# Patient Record
Sex: Male | Born: 1940 | Race: White | Hispanic: No | Marital: Married | Smoking: Former smoker
Health system: Southern US, Community
[De-identification: ages and names within clinical notes are randomized; demographics above are authoritative.]

## PROBLEM LIST (undated history)

## (undated) ENCOUNTER — Emergency Department (HOSPITAL_COMMUNITY): Admission: EM | Payer: Medicare HMO | Source: Home / Self Care

## (undated) DIAGNOSIS — M7500 Adhesive capsulitis of unspecified shoulder: Secondary | ICD-10-CM

## (undated) DIAGNOSIS — M549 Dorsalgia, unspecified: Secondary | ICD-10-CM

## (undated) DIAGNOSIS — K5790 Diverticulosis of intestine, part unspecified, without perforation or abscess without bleeding: Secondary | ICD-10-CM

## (undated) DIAGNOSIS — K219 Gastro-esophageal reflux disease without esophagitis: Secondary | ICD-10-CM

## (undated) DIAGNOSIS — I639 Cerebral infarction, unspecified: Secondary | ICD-10-CM

## (undated) DIAGNOSIS — G709 Myoneural disorder, unspecified: Secondary | ICD-10-CM

## (undated) DIAGNOSIS — I509 Heart failure, unspecified: Secondary | ICD-10-CM

## (undated) DIAGNOSIS — I10 Essential (primary) hypertension: Secondary | ICD-10-CM

## (undated) DIAGNOSIS — J449 Chronic obstructive pulmonary disease, unspecified: Secondary | ICD-10-CM

## (undated) DIAGNOSIS — Z9981 Dependence on supplemental oxygen: Secondary | ICD-10-CM

## (undated) DIAGNOSIS — R21 Rash and other nonspecific skin eruption: Secondary | ICD-10-CM

## (undated) DIAGNOSIS — R0602 Shortness of breath: Secondary | ICD-10-CM

## (undated) DIAGNOSIS — N189 Chronic kidney disease, unspecified: Secondary | ICD-10-CM

## (undated) DIAGNOSIS — M869 Osteomyelitis, unspecified: Secondary | ICD-10-CM

## (undated) DIAGNOSIS — I209 Angina pectoris, unspecified: Secondary | ICD-10-CM

## (undated) DIAGNOSIS — F419 Anxiety disorder, unspecified: Secondary | ICD-10-CM

## (undated) DIAGNOSIS — I2699 Other pulmonary embolism without acute cor pulmonale: Secondary | ICD-10-CM

## (undated) DIAGNOSIS — I2510 Atherosclerotic heart disease of native coronary artery without angina pectoris: Secondary | ICD-10-CM

## (undated) DIAGNOSIS — R7881 Bacteremia: Secondary | ICD-10-CM

## (undated) DIAGNOSIS — M86171 Other acute osteomyelitis, right ankle and foot: Secondary | ICD-10-CM

## (undated) DIAGNOSIS — G4730 Sleep apnea, unspecified: Secondary | ICD-10-CM

## (undated) DIAGNOSIS — F32A Depression, unspecified: Secondary | ICD-10-CM

## (undated) DIAGNOSIS — R296 Repeated falls: Secondary | ICD-10-CM

## (undated) DIAGNOSIS — I739 Peripheral vascular disease, unspecified: Secondary | ICD-10-CM

## (undated) DIAGNOSIS — F329 Major depressive disorder, single episode, unspecified: Secondary | ICD-10-CM

## (undated) DIAGNOSIS — I219 Acute myocardial infarction, unspecified: Secondary | ICD-10-CM

## (undated) DIAGNOSIS — E785 Hyperlipidemia, unspecified: Secondary | ICD-10-CM

## (undated) DIAGNOSIS — N390 Urinary tract infection, site not specified: Secondary | ICD-10-CM

## (undated) DIAGNOSIS — G8929 Other chronic pain: Secondary | ICD-10-CM

## (undated) DIAGNOSIS — B9562 Methicillin resistant Staphylococcus aureus infection as the cause of diseases classified elsewhere: Secondary | ICD-10-CM

## (undated) HISTORY — DX: Major depressive disorder, single episode, unspecified: F32.9

## (undated) HISTORY — DX: Chronic obstructive pulmonary disease, unspecified: J44.9

## (undated) HISTORY — DX: Bacteremia: R78.81

## (undated) HISTORY — DX: Other acute osteomyelitis, right ankle and foot: M86.171

## (undated) HISTORY — DX: Peripheral vascular disease, unspecified: I73.9

## (undated) HISTORY — DX: Gastro-esophageal reflux disease without esophagitis: K21.9

## (undated) HISTORY — PX: OTHER SURGICAL HISTORY: SHX169

## (undated) HISTORY — DX: Urinary tract infection, site not specified: N39.0

## (undated) HISTORY — DX: Other pulmonary embolism without acute cor pulmonale: I26.99

## (undated) HISTORY — DX: Diverticulosis of intestine, part unspecified, without perforation or abscess without bleeding: K57.90

## (undated) HISTORY — DX: Osteomyelitis, unspecified: M86.9

## (undated) HISTORY — DX: Anxiety disorder, unspecified: F41.9

## (undated) HISTORY — PX: CORONARY STENT PLACEMENT: SHX1402

## (undated) HISTORY — DX: Cerebral infarction, unspecified: I63.9

## (undated) HISTORY — DX: Rash and other nonspecific skin eruption: R21

## (undated) HISTORY — DX: Dorsalgia, unspecified: M54.9

## (undated) HISTORY — DX: Other chronic pain: G89.29

## (undated) HISTORY — DX: Atherosclerotic heart disease of native coronary artery without angina pectoris: I25.10

## (undated) HISTORY — DX: Hyperlipidemia, unspecified: E78.5

## (undated) HISTORY — DX: Adhesive capsulitis of unspecified shoulder: M75.00

## (undated) HISTORY — PX: FRACTURE SURGERY: SHX138

## (undated) HISTORY — PX: HIP ARTHROPLASTY: SHX981

## (undated) HISTORY — DX: Methicillin resistant Staphylococcus aureus infection as the cause of diseases classified elsewhere: B95.62

## (undated) HISTORY — DX: Depression, unspecified: F32.A

---

## 1981-03-24 HISTORY — PX: OTHER SURGICAL HISTORY: SHX169

## 1990-03-24 HISTORY — PX: CORONARY ARTERY BYPASS GRAFT: SHX141

## 1997-07-04 ENCOUNTER — Encounter: Admission: RE | Admit: 1997-07-04 | Discharge: 1997-07-04 | Payer: Self-pay | Admitting: Family Medicine

## 1997-07-08 ENCOUNTER — Emergency Department (HOSPITAL_COMMUNITY): Admission: EM | Admit: 1997-07-08 | Discharge: 1997-07-08 | Payer: Self-pay | Admitting: Emergency Medicine

## 1997-07-17 ENCOUNTER — Encounter: Admission: RE | Admit: 1997-07-17 | Discharge: 1997-07-17 | Payer: Self-pay | Admitting: Family Medicine

## 1997-07-27 ENCOUNTER — Encounter: Admission: RE | Admit: 1997-07-27 | Discharge: 1997-07-27 | Payer: Self-pay | Admitting: Family Medicine

## 1997-08-10 ENCOUNTER — Encounter: Admission: RE | Admit: 1997-08-10 | Discharge: 1997-08-10 | Payer: Self-pay | Admitting: Family Medicine

## 1997-08-21 ENCOUNTER — Encounter: Admission: RE | Admit: 1997-08-21 | Discharge: 1997-08-21 | Payer: Self-pay | Admitting: Sports Medicine

## 1997-09-13 ENCOUNTER — Encounter: Admission: RE | Admit: 1997-09-13 | Discharge: 1997-09-13 | Payer: Self-pay | Admitting: Family Medicine

## 1997-10-05 ENCOUNTER — Encounter: Admission: RE | Admit: 1997-10-05 | Discharge: 1997-10-05 | Payer: Self-pay | Admitting: Family Medicine

## 1997-10-31 ENCOUNTER — Inpatient Hospital Stay (HOSPITAL_COMMUNITY): Admission: EM | Admit: 1997-10-31 | Discharge: 1997-11-06 | Payer: Self-pay | Admitting: Emergency Medicine

## 1997-11-13 ENCOUNTER — Encounter: Admission: RE | Admit: 1997-11-13 | Discharge: 1997-11-13 | Payer: Self-pay | Admitting: Family Medicine

## 1997-11-20 ENCOUNTER — Encounter: Admission: RE | Admit: 1997-11-20 | Discharge: 1997-11-20 | Payer: Self-pay | Admitting: Family Medicine

## 1997-11-22 ENCOUNTER — Encounter: Admission: RE | Admit: 1997-11-22 | Discharge: 1997-11-22 | Payer: Self-pay | Admitting: Family Medicine

## 1997-12-13 ENCOUNTER — Inpatient Hospital Stay (HOSPITAL_COMMUNITY): Admission: RE | Admit: 1997-12-13 | Discharge: 1997-12-14 | Payer: Self-pay | Admitting: Vascular Surgery

## 1997-12-27 ENCOUNTER — Encounter: Admission: RE | Admit: 1997-12-27 | Discharge: 1997-12-27 | Payer: Self-pay | Admitting: Family Medicine

## 1997-12-28 ENCOUNTER — Encounter: Admission: RE | Admit: 1997-12-28 | Discharge: 1997-12-28 | Payer: Self-pay | Admitting: Family Medicine

## 1998-01-04 ENCOUNTER — Encounter: Admission: RE | Admit: 1998-01-04 | Discharge: 1998-01-04 | Payer: Self-pay | Admitting: Family Medicine

## 1998-02-08 ENCOUNTER — Encounter: Admission: RE | Admit: 1998-02-08 | Discharge: 1998-02-08 | Payer: Self-pay | Admitting: Family Medicine

## 1998-02-18 ENCOUNTER — Inpatient Hospital Stay (HOSPITAL_COMMUNITY): Admission: EM | Admit: 1998-02-18 | Discharge: 1998-02-21 | Payer: Self-pay | Admitting: Emergency Medicine

## 1998-02-18 ENCOUNTER — Encounter: Payer: Self-pay | Admitting: Emergency Medicine

## 1998-03-02 ENCOUNTER — Encounter: Admission: RE | Admit: 1998-03-02 | Discharge: 1998-03-02 | Payer: Self-pay | Admitting: Family Medicine

## 1998-03-07 ENCOUNTER — Encounter: Payer: Self-pay | Admitting: Orthopaedic Surgery

## 1998-03-07 ENCOUNTER — Ambulatory Visit (HOSPITAL_COMMUNITY): Admission: RE | Admit: 1998-03-07 | Discharge: 1998-03-07 | Payer: Self-pay | Admitting: Orthopaedic Surgery

## 1998-03-28 ENCOUNTER — Encounter: Admission: RE | Admit: 1998-03-28 | Discharge: 1998-03-28 | Payer: Self-pay | Admitting: Family Medicine

## 1998-04-09 ENCOUNTER — Encounter: Admission: RE | Admit: 1998-04-09 | Discharge: 1998-04-09 | Payer: Self-pay | Admitting: Family Medicine

## 1998-07-10 ENCOUNTER — Encounter: Admission: RE | Admit: 1998-07-10 | Discharge: 1998-07-10 | Payer: Self-pay | Admitting: Family Medicine

## 1998-07-12 ENCOUNTER — Encounter: Admission: RE | Admit: 1998-07-12 | Discharge: 1998-07-12 | Payer: Self-pay | Admitting: Family Medicine

## 1998-07-24 ENCOUNTER — Inpatient Hospital Stay (HOSPITAL_COMMUNITY): Admission: EM | Admit: 1998-07-24 | Discharge: 1998-07-26 | Payer: Self-pay | Admitting: Emergency Medicine

## 1998-07-24 ENCOUNTER — Encounter: Admission: RE | Admit: 1998-07-24 | Discharge: 1998-07-24 | Payer: Self-pay | Admitting: Family Medicine

## 1998-07-25 ENCOUNTER — Encounter: Payer: Self-pay | Admitting: Cardiology

## 1998-07-31 ENCOUNTER — Encounter: Admission: RE | Admit: 1998-07-31 | Discharge: 1998-07-31 | Payer: Self-pay | Admitting: Family Medicine

## 1998-08-14 ENCOUNTER — Encounter: Admission: RE | Admit: 1998-08-14 | Discharge: 1998-08-14 | Payer: Self-pay | Admitting: Sports Medicine

## 1998-09-06 ENCOUNTER — Encounter: Admission: RE | Admit: 1998-09-06 | Discharge: 1998-09-06 | Payer: Self-pay | Admitting: Family Medicine

## 1998-10-05 ENCOUNTER — Encounter: Admission: RE | Admit: 1998-10-05 | Discharge: 1998-10-05 | Payer: Self-pay | Admitting: Family Medicine

## 1998-11-09 ENCOUNTER — Encounter: Admission: RE | Admit: 1998-11-09 | Discharge: 1998-11-09 | Payer: Self-pay | Admitting: Family Medicine

## 1998-11-29 ENCOUNTER — Encounter: Admission: RE | Admit: 1998-11-29 | Discharge: 1998-11-29 | Payer: Self-pay | Admitting: Family Medicine

## 1999-02-28 ENCOUNTER — Encounter: Admission: RE | Admit: 1999-02-28 | Discharge: 1999-02-28 | Payer: Self-pay | Admitting: Sports Medicine

## 1999-02-28 ENCOUNTER — Encounter: Payer: Self-pay | Admitting: Sports Medicine

## 1999-03-27 ENCOUNTER — Encounter: Admission: RE | Admit: 1999-03-27 | Discharge: 1999-03-27 | Payer: Self-pay | Admitting: Family Medicine

## 1999-05-13 ENCOUNTER — Encounter: Admission: RE | Admit: 1999-05-13 | Discharge: 1999-05-13 | Payer: Self-pay | Admitting: Family Medicine

## 1999-06-06 ENCOUNTER — Encounter: Admission: RE | Admit: 1999-06-06 | Discharge: 1999-09-04 | Payer: Self-pay | Admitting: Family Medicine

## 1999-07-12 ENCOUNTER — Encounter: Admission: RE | Admit: 1999-07-12 | Discharge: 1999-07-12 | Payer: Self-pay | Admitting: Family Medicine

## 1999-07-26 ENCOUNTER — Encounter: Admission: RE | Admit: 1999-07-26 | Discharge: 1999-07-26 | Payer: Self-pay | Admitting: Family Medicine

## 1999-07-31 ENCOUNTER — Ambulatory Visit (HOSPITAL_COMMUNITY): Admission: RE | Admit: 1999-07-31 | Discharge: 1999-07-31 | Payer: Self-pay | Admitting: Neurosurgery

## 1999-07-31 ENCOUNTER — Encounter: Payer: Self-pay | Admitting: Neurosurgery

## 1999-08-09 ENCOUNTER — Encounter: Admission: RE | Admit: 1999-08-09 | Discharge: 1999-08-09 | Payer: Self-pay | Admitting: Family Medicine

## 1999-08-12 ENCOUNTER — Ambulatory Visit (HOSPITAL_COMMUNITY): Admission: RE | Admit: 1999-08-12 | Discharge: 1999-08-13 | Payer: Self-pay | Admitting: Neurosurgery

## 1999-08-12 ENCOUNTER — Encounter: Payer: Self-pay | Admitting: Neurosurgery

## 1999-08-13 ENCOUNTER — Encounter: Payer: Self-pay | Admitting: Neurosurgery

## 1999-09-18 ENCOUNTER — Encounter: Admission: RE | Admit: 1999-09-18 | Discharge: 1999-09-18 | Payer: Self-pay | Admitting: Family Medicine

## 1999-09-26 ENCOUNTER — Encounter: Payer: Self-pay | Admitting: Neurosurgery

## 1999-09-26 ENCOUNTER — Ambulatory Visit (HOSPITAL_COMMUNITY): Admission: RE | Admit: 1999-09-26 | Discharge: 1999-09-26 | Payer: Self-pay | Admitting: Neurosurgery

## 1999-10-01 ENCOUNTER — Encounter: Payer: Self-pay | Admitting: Neurosurgery

## 1999-10-02 ENCOUNTER — Encounter: Payer: Self-pay | Admitting: Neurosurgery

## 1999-10-02 ENCOUNTER — Ambulatory Visit (HOSPITAL_COMMUNITY): Admission: RE | Admit: 1999-10-02 | Discharge: 1999-10-03 | Payer: Self-pay | Admitting: Neurosurgery

## 1999-10-10 ENCOUNTER — Encounter: Payer: Self-pay | Admitting: Neurosurgery

## 1999-10-10 ENCOUNTER — Ambulatory Visit (HOSPITAL_COMMUNITY): Admission: RE | Admit: 1999-10-10 | Discharge: 1999-10-10 | Payer: Self-pay | Admitting: Neurosurgery

## 1999-10-24 ENCOUNTER — Encounter: Payer: Self-pay | Admitting: Neurosurgery

## 1999-10-24 ENCOUNTER — Ambulatory Visit (HOSPITAL_COMMUNITY): Admission: RE | Admit: 1999-10-24 | Discharge: 1999-10-24 | Payer: Self-pay | Admitting: Neurosurgery

## 1999-11-27 ENCOUNTER — Encounter: Payer: Self-pay | Admitting: Neurosurgery

## 1999-11-27 ENCOUNTER — Ambulatory Visit (HOSPITAL_COMMUNITY): Admission: RE | Admit: 1999-11-27 | Discharge: 1999-11-27 | Payer: Self-pay | Admitting: Neurosurgery

## 1999-12-16 ENCOUNTER — Encounter: Admission: RE | Admit: 1999-12-16 | Discharge: 1999-12-16 | Payer: Self-pay | Admitting: Family Medicine

## 2000-01-06 ENCOUNTER — Ambulatory Visit (HOSPITAL_BASED_OUTPATIENT_CLINIC_OR_DEPARTMENT_OTHER): Admission: RE | Admit: 2000-01-06 | Discharge: 2000-01-06 | Payer: Self-pay | Admitting: Orthopedic Surgery

## 2000-01-17 ENCOUNTER — Encounter: Admission: RE | Admit: 2000-01-17 | Discharge: 2000-01-17 | Payer: Self-pay | Admitting: Family Medicine

## 2000-02-12 ENCOUNTER — Encounter: Admission: RE | Admit: 2000-02-12 | Discharge: 2000-02-12 | Payer: Self-pay | Admitting: Family Medicine

## 2000-02-28 ENCOUNTER — Encounter: Payer: Self-pay | Admitting: Orthopedic Surgery

## 2000-02-28 ENCOUNTER — Encounter: Admission: RE | Admit: 2000-02-28 | Discharge: 2000-02-28 | Payer: Self-pay | Admitting: Orthopedic Surgery

## 2000-03-02 ENCOUNTER — Ambulatory Visit (HOSPITAL_BASED_OUTPATIENT_CLINIC_OR_DEPARTMENT_OTHER): Admission: RE | Admit: 2000-03-02 | Discharge: 2000-03-02 | Payer: Self-pay | Admitting: Orthopedic Surgery

## 2000-03-24 HISTORY — PX: SPINE SURGERY: SHX786

## 2000-04-02 ENCOUNTER — Encounter: Admission: RE | Admit: 2000-04-02 | Discharge: 2000-04-02 | Payer: Self-pay | Admitting: Family Medicine

## 2000-04-09 ENCOUNTER — Ambulatory Visit (HOSPITAL_COMMUNITY): Admission: RE | Admit: 2000-04-09 | Discharge: 2000-04-09 | Payer: Self-pay | Admitting: Gastroenterology

## 2000-05-08 ENCOUNTER — Inpatient Hospital Stay (HOSPITAL_COMMUNITY): Admission: EM | Admit: 2000-05-08 | Discharge: 2000-05-25 | Payer: Self-pay | Admitting: Emergency Medicine

## 2000-05-08 ENCOUNTER — Encounter: Admission: RE | Admit: 2000-05-08 | Discharge: 2000-05-08 | Payer: Self-pay | Admitting: Family Medicine

## 2000-05-08 ENCOUNTER — Encounter: Payer: Self-pay | Admitting: Family Medicine

## 2000-05-11 ENCOUNTER — Ambulatory Visit (HOSPITAL_COMMUNITY): Admission: RE | Admit: 2000-05-11 | Discharge: 2000-05-11 | Payer: Self-pay | Admitting: Family Medicine

## 2000-05-13 ENCOUNTER — Encounter: Payer: Self-pay | Admitting: Cardiology

## 2000-05-15 ENCOUNTER — Encounter: Payer: Self-pay | Admitting: Cardiology

## 2000-05-19 ENCOUNTER — Encounter: Payer: Self-pay | Admitting: Cardiology

## 2000-05-29 ENCOUNTER — Encounter: Admission: RE | Admit: 2000-05-29 | Discharge: 2000-05-29 | Payer: Self-pay | Admitting: Family Medicine

## 2000-06-03 ENCOUNTER — Encounter: Admission: RE | Admit: 2000-06-03 | Discharge: 2000-06-03 | Payer: Self-pay | Admitting: Family Medicine

## 2000-06-10 ENCOUNTER — Encounter: Admission: RE | Admit: 2000-06-10 | Discharge: 2000-06-10 | Payer: Self-pay | Admitting: Family Medicine

## 2000-06-17 ENCOUNTER — Encounter: Admission: RE | Admit: 2000-06-17 | Discharge: 2000-06-17 | Payer: Self-pay | Admitting: Family Medicine

## 2000-06-24 ENCOUNTER — Encounter: Admission: RE | Admit: 2000-06-24 | Discharge: 2000-06-24 | Payer: Self-pay | Admitting: Family Medicine

## 2000-07-01 ENCOUNTER — Encounter: Admission: RE | Admit: 2000-07-01 | Discharge: 2000-07-01 | Payer: Self-pay | Admitting: Family Medicine

## 2000-07-15 ENCOUNTER — Encounter: Admission: RE | Admit: 2000-07-15 | Discharge: 2000-07-15 | Payer: Self-pay | Admitting: Family Medicine

## 2000-07-29 ENCOUNTER — Encounter: Admission: RE | Admit: 2000-07-29 | Discharge: 2000-07-29 | Payer: Self-pay | Admitting: Family Medicine

## 2000-08-18 ENCOUNTER — Encounter: Admission: RE | Admit: 2000-08-18 | Discharge: 2000-08-18 | Payer: Self-pay | Admitting: Family Medicine

## 2000-09-18 ENCOUNTER — Encounter: Admission: RE | Admit: 2000-09-18 | Discharge: 2000-09-18 | Payer: Self-pay | Admitting: *Deleted

## 2000-10-13 ENCOUNTER — Encounter: Admission: RE | Admit: 2000-10-13 | Discharge: 2000-10-13 | Payer: Self-pay | Admitting: Family Medicine

## 2000-11-10 ENCOUNTER — Encounter: Admission: RE | Admit: 2000-11-10 | Discharge: 2000-11-10 | Payer: Self-pay | Admitting: Family Medicine

## 2000-11-17 ENCOUNTER — Encounter: Admission: RE | Admit: 2000-11-17 | Discharge: 2000-11-17 | Payer: Self-pay | Admitting: Family Medicine

## 2000-11-24 ENCOUNTER — Encounter: Admission: RE | Admit: 2000-11-24 | Discharge: 2000-11-24 | Payer: Self-pay | Admitting: Family Medicine

## 2000-12-09 ENCOUNTER — Encounter: Admission: RE | Admit: 2000-12-09 | Discharge: 2000-12-09 | Payer: Self-pay | Admitting: Family Medicine

## 2000-12-18 ENCOUNTER — Encounter: Admission: RE | Admit: 2000-12-18 | Discharge: 2000-12-18 | Payer: Self-pay | Admitting: Family Medicine

## 2000-12-22 ENCOUNTER — Encounter: Admission: RE | Admit: 2000-12-22 | Discharge: 2000-12-22 | Payer: Self-pay | Admitting: Family Medicine

## 2000-12-23 ENCOUNTER — Encounter: Admission: RE | Admit: 2000-12-23 | Discharge: 2000-12-23 | Payer: Self-pay | Admitting: Family Medicine

## 2000-12-23 ENCOUNTER — Ambulatory Visit (HOSPITAL_COMMUNITY): Admission: RE | Admit: 2000-12-23 | Discharge: 2000-12-23 | Payer: Self-pay | Admitting: Family Medicine

## 2000-12-25 ENCOUNTER — Encounter: Admission: RE | Admit: 2000-12-25 | Discharge: 2000-12-25 | Payer: Self-pay | Admitting: Family Medicine

## 2000-12-31 ENCOUNTER — Encounter: Admission: RE | Admit: 2000-12-31 | Discharge: 2000-12-31 | Payer: Self-pay | Admitting: Family Medicine

## 2001-01-07 ENCOUNTER — Encounter: Admission: RE | Admit: 2001-01-07 | Discharge: 2001-01-07 | Payer: Self-pay | Admitting: Family Medicine

## 2001-01-14 ENCOUNTER — Encounter: Admission: RE | Admit: 2001-01-14 | Discharge: 2001-01-14 | Payer: Self-pay | Admitting: Family Medicine

## 2001-01-20 ENCOUNTER — Encounter: Admission: RE | Admit: 2001-01-20 | Discharge: 2001-01-20 | Payer: Self-pay | Admitting: Family Medicine

## 2001-01-22 ENCOUNTER — Encounter: Admission: RE | Admit: 2001-01-22 | Discharge: 2001-01-22 | Payer: Self-pay | Admitting: Family Medicine

## 2001-02-04 ENCOUNTER — Encounter: Admission: RE | Admit: 2001-02-04 | Discharge: 2001-02-04 | Payer: Self-pay | Admitting: Family Medicine

## 2001-02-17 ENCOUNTER — Encounter: Admission: RE | Admit: 2001-02-17 | Discharge: 2001-02-17 | Payer: Self-pay | Admitting: Family Medicine

## 2001-03-15 ENCOUNTER — Encounter: Admission: RE | Admit: 2001-03-15 | Discharge: 2001-03-15 | Payer: Self-pay | Admitting: Family Medicine

## 2001-03-19 ENCOUNTER — Encounter: Admission: RE | Admit: 2001-03-19 | Discharge: 2001-03-19 | Payer: Self-pay | Admitting: Family Medicine

## 2001-04-15 ENCOUNTER — Encounter: Admission: RE | Admit: 2001-04-15 | Discharge: 2001-04-15 | Payer: Self-pay | Admitting: Family Medicine

## 2001-04-27 ENCOUNTER — Encounter (HOSPITAL_COMMUNITY): Admission: RE | Admit: 2001-04-27 | Discharge: 2001-07-26 | Payer: Self-pay | Admitting: Cardiology

## 2001-06-15 ENCOUNTER — Encounter: Admission: RE | Admit: 2001-06-15 | Discharge: 2001-06-15 | Payer: Self-pay | Admitting: Family Medicine

## 2001-07-24 ENCOUNTER — Encounter: Payer: Self-pay | Admitting: Emergency Medicine

## 2001-07-24 ENCOUNTER — Emergency Department (HOSPITAL_COMMUNITY): Admission: EM | Admit: 2001-07-24 | Discharge: 2001-07-24 | Payer: Self-pay

## 2001-07-25 ENCOUNTER — Encounter: Payer: Self-pay | Admitting: Emergency Medicine

## 2001-07-28 ENCOUNTER — Encounter: Admission: RE | Admit: 2001-07-28 | Discharge: 2001-07-28 | Payer: Self-pay | Admitting: Family Medicine

## 2001-07-29 ENCOUNTER — Encounter: Payer: Self-pay | Admitting: Emergency Medicine

## 2001-07-30 ENCOUNTER — Inpatient Hospital Stay (HOSPITAL_COMMUNITY): Admission: EM | Admit: 2001-07-30 | Discharge: 2001-08-03 | Payer: Self-pay | Admitting: Emergency Medicine

## 2001-08-05 ENCOUNTER — Encounter: Admission: RE | Admit: 2001-08-05 | Discharge: 2001-08-05 | Payer: Self-pay | Admitting: Family Medicine

## 2001-08-13 ENCOUNTER — Encounter: Admission: RE | Admit: 2001-08-13 | Discharge: 2001-08-13 | Payer: Self-pay | Admitting: Family Medicine

## 2001-08-19 ENCOUNTER — Encounter: Admission: RE | Admit: 2001-08-19 | Discharge: 2001-08-19 | Payer: Self-pay | Admitting: *Deleted

## 2001-08-27 ENCOUNTER — Encounter: Admission: RE | Admit: 2001-08-27 | Discharge: 2001-08-27 | Payer: Self-pay | Admitting: Family Medicine

## 2001-09-03 ENCOUNTER — Encounter: Admission: RE | Admit: 2001-09-03 | Discharge: 2001-09-03 | Payer: Self-pay | Admitting: Family Medicine

## 2001-09-17 ENCOUNTER — Encounter: Admission: RE | Admit: 2001-09-17 | Discharge: 2001-09-17 | Payer: Self-pay | Admitting: Family Medicine

## 2001-09-21 ENCOUNTER — Encounter: Admission: RE | Admit: 2001-09-21 | Discharge: 2001-09-21 | Payer: Self-pay | Admitting: Family Medicine

## 2001-10-15 ENCOUNTER — Encounter: Admission: RE | Admit: 2001-10-15 | Discharge: 2001-10-15 | Payer: Self-pay | Admitting: Family Medicine

## 2001-10-27 ENCOUNTER — Encounter: Payer: Self-pay | Admitting: Gastroenterology

## 2001-10-27 ENCOUNTER — Encounter: Admission: RE | Admit: 2001-10-27 | Discharge: 2001-10-27 | Payer: Self-pay | Admitting: Gastroenterology

## 2001-11-04 ENCOUNTER — Encounter (INDEPENDENT_AMBULATORY_CARE_PROVIDER_SITE_OTHER): Payer: Self-pay | Admitting: Specialist

## 2001-11-04 ENCOUNTER — Ambulatory Visit (HOSPITAL_COMMUNITY): Admission: RE | Admit: 2001-11-04 | Discharge: 2001-11-04 | Payer: Self-pay | Admitting: Gastroenterology

## 2001-11-16 ENCOUNTER — Encounter: Admission: RE | Admit: 2001-11-16 | Discharge: 2001-11-16 | Payer: Self-pay | Admitting: Family Medicine

## 2001-11-16 ENCOUNTER — Ambulatory Visit (HOSPITAL_COMMUNITY): Admission: RE | Admit: 2001-11-16 | Discharge: 2001-11-16 | Payer: Self-pay | Admitting: Sports Medicine

## 2001-12-01 ENCOUNTER — Ambulatory Visit (HOSPITAL_COMMUNITY): Admission: RE | Admit: 2001-12-01 | Discharge: 2001-12-01 | Payer: Self-pay | Admitting: Family Medicine

## 2001-12-01 ENCOUNTER — Encounter: Admission: RE | Admit: 2001-12-01 | Discharge: 2001-12-01 | Payer: Self-pay | Admitting: Family Medicine

## 2001-12-01 ENCOUNTER — Observation Stay (HOSPITAL_COMMUNITY): Admission: AD | Admit: 2001-12-01 | Discharge: 2001-12-02 | Payer: Self-pay | Admitting: Family Medicine

## 2001-12-01 ENCOUNTER — Encounter: Payer: Self-pay | Admitting: Family Medicine

## 2001-12-02 ENCOUNTER — Encounter: Payer: Self-pay | Admitting: Family Medicine

## 2001-12-06 ENCOUNTER — Encounter: Admission: RE | Admit: 2001-12-06 | Discharge: 2001-12-06 | Payer: Self-pay | Admitting: Family Medicine

## 2002-01-05 ENCOUNTER — Encounter: Admission: RE | Admit: 2002-01-05 | Discharge: 2002-01-05 | Payer: Self-pay | Admitting: Family Medicine

## 2002-02-07 ENCOUNTER — Encounter: Admission: RE | Admit: 2002-02-07 | Discharge: 2002-02-07 | Payer: Self-pay | Admitting: Family Medicine

## 2002-02-21 ENCOUNTER — Encounter: Admission: RE | Admit: 2002-02-21 | Discharge: 2002-02-21 | Payer: Self-pay | Admitting: Family Medicine

## 2002-03-07 ENCOUNTER — Encounter: Admission: RE | Admit: 2002-03-07 | Discharge: 2002-03-07 | Payer: Self-pay | Admitting: Family Medicine

## 2002-03-09 ENCOUNTER — Encounter: Admission: RE | Admit: 2002-03-09 | Discharge: 2002-03-09 | Payer: Self-pay | Admitting: Family Medicine

## 2002-03-11 ENCOUNTER — Encounter: Admission: RE | Admit: 2002-03-11 | Discharge: 2002-03-11 | Payer: Self-pay | Admitting: Family Medicine

## 2002-03-18 ENCOUNTER — Encounter: Admission: RE | Admit: 2002-03-18 | Discharge: 2002-03-18 | Payer: Self-pay | Admitting: Family Medicine

## 2002-04-04 ENCOUNTER — Encounter: Admission: RE | Admit: 2002-04-04 | Discharge: 2002-04-04 | Payer: Self-pay | Admitting: Family Medicine

## 2002-04-21 ENCOUNTER — Encounter: Admission: RE | Admit: 2002-04-21 | Discharge: 2002-04-21 | Payer: Self-pay | Admitting: Family Medicine

## 2002-04-26 ENCOUNTER — Encounter: Admission: RE | Admit: 2002-04-26 | Discharge: 2002-04-26 | Payer: Self-pay | Admitting: Family Medicine

## 2002-05-02 ENCOUNTER — Encounter: Payer: Self-pay | Admitting: Sports Medicine

## 2002-05-02 ENCOUNTER — Encounter: Admission: RE | Admit: 2002-05-02 | Discharge: 2002-05-02 | Payer: Self-pay | Admitting: Sports Medicine

## 2002-05-05 ENCOUNTER — Encounter: Admission: RE | Admit: 2002-05-05 | Discharge: 2002-05-05 | Payer: Self-pay | Admitting: Family Medicine

## 2002-05-17 ENCOUNTER — Encounter: Payer: Self-pay | Admitting: Emergency Medicine

## 2002-05-17 ENCOUNTER — Inpatient Hospital Stay (HOSPITAL_COMMUNITY): Admission: EM | Admit: 2002-05-17 | Discharge: 2002-05-19 | Payer: Self-pay | Admitting: Emergency Medicine

## 2002-05-18 ENCOUNTER — Encounter: Payer: Self-pay | Admitting: Family Medicine

## 2002-05-25 ENCOUNTER — Encounter: Admission: RE | Admit: 2002-05-25 | Discharge: 2002-05-25 | Payer: Self-pay | Admitting: Family Medicine

## 2002-06-08 ENCOUNTER — Encounter: Admission: RE | Admit: 2002-06-08 | Discharge: 2002-06-08 | Payer: Self-pay | Admitting: Family Medicine

## 2002-06-16 ENCOUNTER — Ambulatory Visit (HOSPITAL_BASED_OUTPATIENT_CLINIC_OR_DEPARTMENT_OTHER): Admission: RE | Admit: 2002-06-16 | Discharge: 2002-06-16 | Payer: Self-pay | Admitting: Family Medicine

## 2002-06-22 ENCOUNTER — Encounter: Admission: RE | Admit: 2002-06-22 | Discharge: 2002-06-22 | Payer: Self-pay | Admitting: Family Medicine

## 2002-07-13 ENCOUNTER — Encounter: Admission: RE | Admit: 2002-07-13 | Discharge: 2002-07-13 | Payer: Self-pay | Admitting: Family Medicine

## 2002-08-08 ENCOUNTER — Encounter: Admission: RE | Admit: 2002-08-08 | Discharge: 2002-08-08 | Payer: Self-pay | Admitting: Family Medicine

## 2002-08-08 ENCOUNTER — Ambulatory Visit (HOSPITAL_COMMUNITY): Admission: RE | Admit: 2002-08-08 | Discharge: 2002-08-08 | Payer: Self-pay | Admitting: Family Medicine

## 2002-10-20 ENCOUNTER — Encounter: Admission: RE | Admit: 2002-10-20 | Discharge: 2002-10-20 | Payer: Self-pay | Admitting: Family Medicine

## 2002-11-17 ENCOUNTER — Encounter: Admission: RE | Admit: 2002-11-17 | Discharge: 2002-11-17 | Payer: Self-pay | Admitting: Family Medicine

## 2002-11-23 ENCOUNTER — Encounter: Admission: RE | Admit: 2002-11-23 | Discharge: 2002-11-23 | Payer: Self-pay | Admitting: Family Medicine

## 2002-12-07 ENCOUNTER — Encounter: Admission: RE | Admit: 2002-12-07 | Discharge: 2002-12-07 | Payer: Self-pay | Admitting: Family Medicine

## 2002-12-21 ENCOUNTER — Encounter: Admission: RE | Admit: 2002-12-21 | Discharge: 2002-12-21 | Payer: Self-pay | Admitting: Family Medicine

## 2003-01-05 ENCOUNTER — Encounter: Admission: RE | Admit: 2003-01-05 | Discharge: 2003-01-05 | Payer: Self-pay | Admitting: Sports Medicine

## 2003-01-19 ENCOUNTER — Encounter: Admission: RE | Admit: 2003-01-19 | Discharge: 2003-01-19 | Payer: Self-pay | Admitting: Family Medicine

## 2003-02-24 ENCOUNTER — Encounter: Admission: RE | Admit: 2003-02-24 | Discharge: 2003-02-24 | Payer: Self-pay | Admitting: Family Medicine

## 2003-03-10 ENCOUNTER — Encounter: Admission: RE | Admit: 2003-03-10 | Discharge: 2003-03-10 | Payer: Self-pay | Admitting: Sports Medicine

## 2003-03-10 ENCOUNTER — Inpatient Hospital Stay (HOSPITAL_COMMUNITY): Admission: AD | Admit: 2003-03-10 | Discharge: 2003-03-14 | Payer: Self-pay | Admitting: Sports Medicine

## 2003-03-10 ENCOUNTER — Ambulatory Visit (HOSPITAL_COMMUNITY): Admission: RE | Admit: 2003-03-10 | Discharge: 2003-03-10 | Payer: Self-pay | Admitting: Sports Medicine

## 2003-03-29 ENCOUNTER — Encounter: Admission: RE | Admit: 2003-03-29 | Discharge: 2003-03-29 | Payer: Self-pay | Admitting: Family Medicine

## 2003-04-06 ENCOUNTER — Observation Stay (HOSPITAL_COMMUNITY): Admission: EM | Admit: 2003-04-06 | Discharge: 2003-04-07 | Payer: Self-pay | Admitting: Emergency Medicine

## 2003-04-17 ENCOUNTER — Encounter: Admission: RE | Admit: 2003-04-17 | Discharge: 2003-04-17 | Payer: Self-pay | Admitting: Family Medicine

## 2003-04-24 ENCOUNTER — Encounter: Admission: RE | Admit: 2003-04-24 | Discharge: 2003-04-24 | Payer: Self-pay | Admitting: Family Medicine

## 2003-04-28 ENCOUNTER — Encounter: Admission: RE | Admit: 2003-04-28 | Discharge: 2003-04-28 | Payer: Self-pay | Admitting: Family Medicine

## 2003-05-11 ENCOUNTER — Encounter (HOSPITAL_COMMUNITY): Admission: RE | Admit: 2003-05-11 | Discharge: 2003-08-09 | Payer: Self-pay | Admitting: Cardiology

## 2003-05-12 ENCOUNTER — Encounter: Admission: RE | Admit: 2003-05-12 | Discharge: 2003-05-12 | Payer: Self-pay | Admitting: Family Medicine

## 2003-05-19 ENCOUNTER — Encounter: Admission: RE | Admit: 2003-05-19 | Discharge: 2003-05-19 | Payer: Self-pay | Admitting: Family Medicine

## 2003-05-25 ENCOUNTER — Encounter: Admission: RE | Admit: 2003-05-25 | Discharge: 2003-05-25 | Payer: Self-pay | Admitting: Family Medicine

## 2003-05-29 ENCOUNTER — Encounter: Admission: RE | Admit: 2003-05-29 | Discharge: 2003-05-29 | Payer: Self-pay | Admitting: Family Medicine

## 2003-06-08 ENCOUNTER — Encounter: Admission: RE | Admit: 2003-06-08 | Discharge: 2003-06-08 | Payer: Self-pay | Admitting: Sports Medicine

## 2003-06-23 ENCOUNTER — Encounter: Payer: Self-pay | Admitting: Family Medicine

## 2003-06-23 ENCOUNTER — Encounter: Admission: RE | Admit: 2003-06-23 | Discharge: 2003-06-23 | Payer: Self-pay | Admitting: Family Medicine

## 2003-06-23 LAB — CONVERTED CEMR LAB: Hgb A1c MFr Bld: 6 %

## 2003-06-29 ENCOUNTER — Encounter: Admission: RE | Admit: 2003-06-29 | Discharge: 2003-06-29 | Payer: Self-pay | Admitting: Family Medicine

## 2003-07-07 ENCOUNTER — Encounter: Admission: RE | Admit: 2003-07-07 | Discharge: 2003-07-07 | Payer: Self-pay | Admitting: Sports Medicine

## 2003-07-12 ENCOUNTER — Observation Stay (HOSPITAL_COMMUNITY): Admission: EM | Admit: 2003-07-12 | Discharge: 2003-07-13 | Payer: Self-pay | Admitting: Emergency Medicine

## 2003-07-20 ENCOUNTER — Encounter: Admission: RE | Admit: 2003-07-20 | Discharge: 2003-07-20 | Payer: Self-pay | Admitting: Family Medicine

## 2003-08-03 ENCOUNTER — Encounter: Admission: RE | Admit: 2003-08-03 | Discharge: 2003-08-03 | Payer: Self-pay | Admitting: Sports Medicine

## 2003-08-10 ENCOUNTER — Encounter (HOSPITAL_COMMUNITY): Admission: RE | Admit: 2003-08-10 | Discharge: 2003-08-17 | Payer: Self-pay | Admitting: Cardiology

## 2003-08-11 ENCOUNTER — Encounter: Admission: RE | Admit: 2003-08-11 | Discharge: 2003-08-11 | Payer: Self-pay | Admitting: Sports Medicine

## 2003-08-21 ENCOUNTER — Encounter: Admission: RE | Admit: 2003-08-21 | Discharge: 2003-08-21 | Payer: Self-pay | Admitting: Family Medicine

## 2003-08-25 ENCOUNTER — Encounter: Admission: RE | Admit: 2003-08-25 | Discharge: 2003-08-25 | Payer: Self-pay | Admitting: Family Medicine

## 2003-09-08 ENCOUNTER — Encounter: Admission: RE | Admit: 2003-09-08 | Discharge: 2003-09-08 | Payer: Self-pay | Admitting: Family Medicine

## 2003-09-22 ENCOUNTER — Encounter: Admission: RE | Admit: 2003-09-22 | Discharge: 2003-09-22 | Payer: Self-pay | Admitting: Family Medicine

## 2003-10-06 ENCOUNTER — Encounter: Admission: RE | Admit: 2003-10-06 | Discharge: 2003-10-06 | Payer: Self-pay | Admitting: Family Medicine

## 2003-10-09 ENCOUNTER — Encounter: Admission: RE | Admit: 2003-10-09 | Discharge: 2003-10-09 | Payer: Self-pay | Admitting: Family Medicine

## 2003-10-20 ENCOUNTER — Encounter: Admission: RE | Admit: 2003-10-20 | Discharge: 2003-10-20 | Payer: Self-pay | Admitting: Family Medicine

## 2003-11-03 ENCOUNTER — Encounter: Admission: RE | Admit: 2003-11-03 | Discharge: 2003-11-03 | Payer: Self-pay | Admitting: Family Medicine

## 2003-11-06 ENCOUNTER — Encounter: Admission: RE | Admit: 2003-11-06 | Discharge: 2003-11-06 | Payer: Self-pay | Admitting: Sports Medicine

## 2003-11-06 ENCOUNTER — Ambulatory Visit (HOSPITAL_COMMUNITY): Admission: RE | Admit: 2003-11-06 | Discharge: 2003-11-06 | Payer: Self-pay | Admitting: Sports Medicine

## 2003-11-13 ENCOUNTER — Encounter: Admission: RE | Admit: 2003-11-13 | Discharge: 2003-11-13 | Payer: Self-pay | Admitting: Family Medicine

## 2003-11-20 ENCOUNTER — Encounter: Admission: RE | Admit: 2003-11-20 | Discharge: 2003-11-20 | Payer: Self-pay | Admitting: Sports Medicine

## 2003-12-02 ENCOUNTER — Emergency Department (HOSPITAL_COMMUNITY): Admission: EM | Admit: 2003-12-02 | Discharge: 2003-12-03 | Payer: Self-pay | Admitting: *Deleted

## 2003-12-04 ENCOUNTER — Ambulatory Visit: Payer: Self-pay | Admitting: Family Medicine

## 2003-12-11 ENCOUNTER — Ambulatory Visit: Payer: Self-pay | Admitting: Family Medicine

## 2004-01-01 ENCOUNTER — Ambulatory Visit: Payer: Self-pay | Admitting: Sports Medicine

## 2004-01-15 ENCOUNTER — Ambulatory Visit: Payer: Self-pay | Admitting: Family Medicine

## 2004-02-05 ENCOUNTER — Ambulatory Visit: Payer: Self-pay | Admitting: Family Medicine

## 2004-02-26 ENCOUNTER — Ambulatory Visit: Payer: Self-pay | Admitting: Family Medicine

## 2004-03-24 HISTORY — PX: COLONOSCOPY: SHX174

## 2004-04-04 ENCOUNTER — Emergency Department (HOSPITAL_COMMUNITY): Admission: EM | Admit: 2004-04-04 | Discharge: 2004-04-04 | Payer: Self-pay | Admitting: Emergency Medicine

## 2004-04-08 ENCOUNTER — Ambulatory Visit: Payer: Self-pay | Admitting: Sports Medicine

## 2004-04-19 ENCOUNTER — Ambulatory Visit: Payer: Self-pay | Admitting: Family Medicine

## 2004-05-07 ENCOUNTER — Ambulatory Visit: Payer: Self-pay | Admitting: Family Medicine

## 2004-05-08 ENCOUNTER — Encounter (INDEPENDENT_AMBULATORY_CARE_PROVIDER_SITE_OTHER): Payer: Self-pay | Admitting: Specialist

## 2004-05-08 ENCOUNTER — Ambulatory Visit (HOSPITAL_COMMUNITY): Admission: RE | Admit: 2004-05-08 | Discharge: 2004-05-08 | Payer: Self-pay | Admitting: Gastroenterology

## 2004-05-22 ENCOUNTER — Encounter: Payer: Self-pay | Admitting: Family Medicine

## 2004-05-30 ENCOUNTER — Ambulatory Visit: Payer: Self-pay | Admitting: Family Medicine

## 2004-06-12 ENCOUNTER — Ambulatory Visit: Payer: Self-pay | Admitting: Family Medicine

## 2004-06-19 ENCOUNTER — Encounter: Admission: RE | Admit: 2004-06-19 | Discharge: 2004-06-19 | Payer: Self-pay | Admitting: Sports Medicine

## 2004-06-19 ENCOUNTER — Ambulatory Visit: Payer: Self-pay | Admitting: Family Medicine

## 2004-06-28 ENCOUNTER — Ambulatory Visit: Payer: Self-pay | Admitting: Family Medicine

## 2004-07-05 ENCOUNTER — Ambulatory Visit: Payer: Self-pay | Admitting: Family Medicine

## 2004-07-19 ENCOUNTER — Ambulatory Visit: Payer: Self-pay | Admitting: Family Medicine

## 2004-07-26 ENCOUNTER — Ambulatory Visit: Payer: Self-pay | Admitting: Family Medicine

## 2004-08-02 ENCOUNTER — Ambulatory Visit: Payer: Self-pay | Admitting: Family Medicine

## 2004-08-06 ENCOUNTER — Ambulatory Visit: Payer: Self-pay | Admitting: Sports Medicine

## 2004-08-06 ENCOUNTER — Observation Stay (HOSPITAL_COMMUNITY): Admission: EM | Admit: 2004-08-06 | Discharge: 2004-08-08 | Payer: Self-pay | Admitting: Family Medicine

## 2004-08-12 ENCOUNTER — Ambulatory Visit: Payer: Self-pay | Admitting: Family Medicine

## 2004-08-22 ENCOUNTER — Ambulatory Visit: Payer: Self-pay | Admitting: Family Medicine

## 2004-08-30 ENCOUNTER — Ambulatory Visit: Payer: Self-pay | Admitting: Family Medicine

## 2004-09-23 ENCOUNTER — Ambulatory Visit: Payer: Self-pay | Admitting: Family Medicine

## 2004-09-27 ENCOUNTER — Ambulatory Visit: Payer: Self-pay | Admitting: Family Medicine

## 2004-10-07 ENCOUNTER — Inpatient Hospital Stay (HOSPITAL_COMMUNITY): Admission: EM | Admit: 2004-10-07 | Discharge: 2004-10-09 | Payer: Self-pay | Admitting: Emergency Medicine

## 2004-10-07 ENCOUNTER — Ambulatory Visit: Payer: Self-pay | Admitting: Sports Medicine

## 2004-10-21 ENCOUNTER — Ambulatory Visit: Payer: Self-pay | Admitting: Family Medicine

## 2004-11-20 ENCOUNTER — Ambulatory Visit: Payer: Self-pay | Admitting: Family Medicine

## 2004-11-22 ENCOUNTER — Encounter: Payer: Self-pay | Admitting: Family Medicine

## 2004-11-22 LAB — CONVERTED CEMR LAB
Hgb A1c MFr Bld: 6.4 %
Microalbumin U total vol: 5.3 mg/L
PSA: 2.26 ng/mL

## 2004-12-02 ENCOUNTER — Ambulatory Visit: Payer: Self-pay | Admitting: Family Medicine

## 2004-12-04 ENCOUNTER — Ambulatory Visit: Payer: Self-pay | Admitting: Family Medicine

## 2005-01-02 ENCOUNTER — Ambulatory Visit: Payer: Self-pay | Admitting: Family Medicine

## 2005-01-09 ENCOUNTER — Ambulatory Visit: Payer: Self-pay | Admitting: Family Medicine

## 2005-01-30 ENCOUNTER — Ambulatory Visit: Payer: Self-pay | Admitting: Family Medicine

## 2005-02-05 ENCOUNTER — Ambulatory Visit: Payer: Self-pay | Admitting: Family Medicine

## 2005-02-12 ENCOUNTER — Ambulatory Visit: Payer: Self-pay | Admitting: Family Medicine

## 2005-03-05 ENCOUNTER — Ambulatory Visit: Payer: Self-pay | Admitting: Family Medicine

## 2005-03-17 ENCOUNTER — Inpatient Hospital Stay (HOSPITAL_COMMUNITY): Admission: EM | Admit: 2005-03-17 | Discharge: 2005-03-19 | Payer: Self-pay | Admitting: Emergency Medicine

## 2005-03-17 HISTORY — PX: CARDIAC CATHETERIZATION: SHX172

## 2005-03-25 ENCOUNTER — Ambulatory Visit: Payer: Self-pay | Admitting: Family Medicine

## 2005-04-08 ENCOUNTER — Ambulatory Visit: Payer: Self-pay | Admitting: Family Medicine

## 2005-04-15 ENCOUNTER — Ambulatory Visit: Payer: Self-pay | Admitting: Family Medicine

## 2005-04-22 ENCOUNTER — Ambulatory Visit: Payer: Self-pay | Admitting: Family Medicine

## 2005-04-30 ENCOUNTER — Ambulatory Visit: Payer: Self-pay | Admitting: Family Medicine

## 2005-05-12 ENCOUNTER — Ambulatory Visit: Payer: Self-pay | Admitting: Family Medicine

## 2005-05-14 ENCOUNTER — Ambulatory Visit: Payer: Self-pay | Admitting: Family Medicine

## 2005-05-27 ENCOUNTER — Ambulatory Visit: Payer: Self-pay | Admitting: Internal Medicine

## 2005-05-27 ENCOUNTER — Inpatient Hospital Stay (HOSPITAL_COMMUNITY): Admission: EM | Admit: 2005-05-27 | Discharge: 2005-06-05 | Payer: Self-pay | Admitting: Emergency Medicine

## 2005-05-27 ENCOUNTER — Encounter: Payer: Self-pay | Admitting: Cardiology

## 2005-06-12 ENCOUNTER — Ambulatory Visit: Payer: Self-pay | Admitting: Family Medicine

## 2005-07-14 ENCOUNTER — Ambulatory Visit: Payer: Self-pay | Admitting: Family Medicine

## 2005-07-28 ENCOUNTER — Ambulatory Visit: Payer: Self-pay | Admitting: Family Medicine

## 2005-08-11 ENCOUNTER — Ambulatory Visit: Payer: Self-pay | Admitting: Family Medicine

## 2005-08-25 ENCOUNTER — Ambulatory Visit: Payer: Self-pay | Admitting: Family Medicine

## 2005-09-10 ENCOUNTER — Ambulatory Visit: Payer: Self-pay | Admitting: Family Medicine

## 2005-10-10 ENCOUNTER — Ambulatory Visit: Payer: Self-pay | Admitting: Family Medicine

## 2005-12-01 ENCOUNTER — Ambulatory Visit: Payer: Self-pay | Admitting: Family Medicine

## 2006-01-09 ENCOUNTER — Ambulatory Visit: Payer: Self-pay | Admitting: Family Medicine

## 2006-02-19 ENCOUNTER — Ambulatory Visit: Payer: Self-pay | Admitting: Pulmonary Disease

## 2006-02-26 ENCOUNTER — Ambulatory Visit: Admission: RE | Admit: 2006-02-26 | Discharge: 2006-02-26 | Payer: Self-pay | Admitting: Pulmonary Disease

## 2006-03-03 ENCOUNTER — Ambulatory Visit: Payer: Self-pay | Admitting: Pulmonary Disease

## 2006-03-03 ENCOUNTER — Ambulatory Visit (HOSPITAL_BASED_OUTPATIENT_CLINIC_OR_DEPARTMENT_OTHER): Admission: RE | Admit: 2006-03-03 | Discharge: 2006-03-03 | Payer: Self-pay | Admitting: Pulmonary Disease

## 2006-03-04 HISTORY — PX: US ECHOCARDIOGRAPHY: HXRAD669

## 2006-03-12 ENCOUNTER — Ambulatory Visit: Payer: Self-pay | Admitting: Pulmonary Disease

## 2006-03-24 HISTORY — PX: FEMORAL-POPLITEAL BYPASS GRAFT: SHX937

## 2006-04-13 ENCOUNTER — Ambulatory Visit: Payer: Self-pay | Admitting: Pulmonary Disease

## 2006-04-27 ENCOUNTER — Ambulatory Visit: Payer: Self-pay | Admitting: Family Medicine

## 2006-05-21 DIAGNOSIS — K210 Gastro-esophageal reflux disease with esophagitis: Secondary | ICD-10-CM

## 2006-05-21 DIAGNOSIS — E7800 Pure hypercholesterolemia, unspecified: Secondary | ICD-10-CM | POA: Insufficient documentation

## 2006-05-21 DIAGNOSIS — G4730 Sleep apnea, unspecified: Secondary | ICD-10-CM | POA: Insufficient documentation

## 2006-05-21 DIAGNOSIS — I2510 Atherosclerotic heart disease of native coronary artery without angina pectoris: Secondary | ICD-10-CM

## 2006-05-21 DIAGNOSIS — I5022 Chronic systolic (congestive) heart failure: Secondary | ICD-10-CM

## 2006-05-21 DIAGNOSIS — J4489 Other specified chronic obstructive pulmonary disease: Secondary | ICD-10-CM | POA: Insufficient documentation

## 2006-05-21 DIAGNOSIS — J449 Chronic obstructive pulmonary disease, unspecified: Secondary | ICD-10-CM

## 2006-05-21 DIAGNOSIS — I6789 Other cerebrovascular disease: Secondary | ICD-10-CM | POA: Insufficient documentation

## 2006-05-21 DIAGNOSIS — F411 Generalized anxiety disorder: Secondary | ICD-10-CM

## 2006-05-23 ENCOUNTER — Encounter: Payer: Self-pay | Admitting: Family Medicine

## 2006-05-23 LAB — CONVERTED CEMR LAB: Hgb A1c MFr Bld: 9.9 %

## 2006-05-26 ENCOUNTER — Ambulatory Visit: Payer: Self-pay | Admitting: Family Medicine

## 2006-05-26 LAB — CONVERTED CEMR LAB
AST: 21 units/L (ref 0–37)
Albumin: 3.3 g/dL — ABNORMAL LOW (ref 3.5–5.2)
Alkaline Phosphatase: 76 units/L (ref 39–117)
BUN: 9 mg/dL (ref 6–23)
Basophils Absolute: 0.1 10*3/uL (ref 0.0–0.1)
Chloride: 104 meq/L (ref 96–112)
Creatinine, Ser: 0.6 mg/dL (ref 0.4–1.5)
Eosinophils Absolute: 0.4 10*3/uL (ref 0.0–0.6)
GFR calc non Af Amer: 143 mL/min
Hgb A1c MFr Bld: 9.9 % — ABNORMAL HIGH (ref 4.6–6.0)
MCHC: 33.8 g/dL (ref 30.0–36.0)
MCV: 80.2 fL (ref 78.0–100.0)
Monocytes Relative: 9.7 % (ref 3.0–11.0)
Potassium: 4.5 meq/L (ref 3.5–5.1)
RBC: 4.69 M/uL (ref 4.22–5.81)
RDW: 16.2 % — ABNORMAL HIGH (ref 11.5–14.6)
TSH: 2.02 microintl units/mL (ref 0.35–5.50)
Total Bilirubin: 0.7 mg/dL (ref 0.3–1.2)

## 2006-06-20 ENCOUNTER — Inpatient Hospital Stay (HOSPITAL_COMMUNITY): Admission: EM | Admit: 2006-06-20 | Discharge: 2006-06-28 | Payer: Self-pay | Admitting: *Deleted

## 2006-06-20 ENCOUNTER — Ambulatory Visit: Payer: Self-pay | Admitting: Vascular Surgery

## 2006-06-20 ENCOUNTER — Encounter: Payer: Self-pay | Admitting: Vascular Surgery

## 2006-06-21 ENCOUNTER — Ambulatory Visit: Payer: Self-pay | Admitting: Internal Medicine

## 2006-06-22 ENCOUNTER — Encounter (INDEPENDENT_AMBULATORY_CARE_PROVIDER_SITE_OTHER): Payer: Self-pay | Admitting: Specialist

## 2006-06-23 ENCOUNTER — Encounter: Payer: Self-pay | Admitting: Vascular Surgery

## 2006-06-23 ENCOUNTER — Encounter (INDEPENDENT_AMBULATORY_CARE_PROVIDER_SITE_OTHER): Payer: Self-pay | Admitting: Internal Medicine

## 2006-07-06 ENCOUNTER — Ambulatory Visit: Payer: Self-pay | Admitting: Vascular Surgery

## 2006-07-08 ENCOUNTER — Encounter: Payer: Self-pay | Admitting: Family Medicine

## 2006-07-08 DIAGNOSIS — F329 Major depressive disorder, single episode, unspecified: Secondary | ICD-10-CM

## 2006-07-08 DIAGNOSIS — K5730 Diverticulosis of large intestine without perforation or abscess without bleeding: Secondary | ICD-10-CM | POA: Insufficient documentation

## 2006-07-08 DIAGNOSIS — E119 Type 2 diabetes mellitus without complications: Secondary | ICD-10-CM | POA: Insufficient documentation

## 2006-07-08 DIAGNOSIS — I10 Essential (primary) hypertension: Secondary | ICD-10-CM

## 2006-07-13 ENCOUNTER — Ambulatory Visit: Payer: Self-pay | Admitting: Vascular Surgery

## 2006-07-30 ENCOUNTER — Ambulatory Visit: Payer: Self-pay | Admitting: *Deleted

## 2006-09-03 ENCOUNTER — Ambulatory Visit: Payer: Self-pay | Admitting: *Deleted

## 2006-09-21 ENCOUNTER — Encounter (HOSPITAL_BASED_OUTPATIENT_CLINIC_OR_DEPARTMENT_OTHER): Admission: RE | Admit: 2006-09-21 | Discharge: 2006-12-20 | Payer: Self-pay | Admitting: Surgery

## 2006-09-28 ENCOUNTER — Emergency Department (HOSPITAL_COMMUNITY): Admission: EM | Admit: 2006-09-28 | Discharge: 2006-09-28 | Payer: Self-pay | Admitting: Emergency Medicine

## 2006-10-01 ENCOUNTER — Ambulatory Visit: Payer: Self-pay | Admitting: *Deleted

## 2006-10-15 ENCOUNTER — Encounter: Payer: Self-pay | Admitting: Family Medicine

## 2006-10-19 ENCOUNTER — Ambulatory Visit: Admission: RE | Admit: 2006-10-19 | Discharge: 2006-10-19 | Payer: Self-pay | Admitting: Surgery

## 2006-10-19 ENCOUNTER — Ambulatory Visit: Payer: Self-pay | Admitting: Vascular Surgery

## 2006-10-20 ENCOUNTER — Encounter: Payer: Self-pay | Admitting: Family Medicine

## 2006-11-14 ENCOUNTER — Ambulatory Visit: Payer: Self-pay | Admitting: Internal Medicine

## 2006-12-14 ENCOUNTER — Telehealth (INDEPENDENT_AMBULATORY_CARE_PROVIDER_SITE_OTHER): Payer: Self-pay | Admitting: Internal Medicine

## 2006-12-21 ENCOUNTER — Telehealth (INDEPENDENT_AMBULATORY_CARE_PROVIDER_SITE_OTHER): Payer: Self-pay | Admitting: *Deleted

## 2007-01-04 ENCOUNTER — Encounter: Payer: Self-pay | Admitting: Family Medicine

## 2007-01-19 ENCOUNTER — Encounter: Payer: Self-pay | Admitting: Family Medicine

## 2007-03-02 ENCOUNTER — Encounter: Payer: Self-pay | Admitting: Family Medicine

## 2007-03-08 ENCOUNTER — Ambulatory Visit: Payer: Self-pay | Admitting: Family Medicine

## 2007-04-13 ENCOUNTER — Ambulatory Visit: Payer: Self-pay | Admitting: Family Medicine

## 2007-04-21 ENCOUNTER — Telehealth: Payer: Self-pay | Admitting: Family Medicine

## 2007-04-22 ENCOUNTER — Ambulatory Visit: Payer: Self-pay | Admitting: *Deleted

## 2007-06-10 ENCOUNTER — Encounter: Payer: Self-pay | Admitting: Family Medicine

## 2007-06-30 ENCOUNTER — Encounter: Payer: Self-pay | Admitting: Family Medicine

## 2007-07-15 ENCOUNTER — Ambulatory Visit: Payer: Self-pay | Admitting: *Deleted

## 2007-07-19 ENCOUNTER — Ambulatory Visit: Payer: Self-pay | Admitting: Family Medicine

## 2007-07-19 LAB — CONVERTED CEMR LAB
ALT: 21 units/L (ref 0–53)
AST: 19 units/L (ref 0–37)
Albumin: 3.8 g/dL (ref 3.5–5.2)
Alkaline Phosphatase: 81 units/L (ref 39–117)
BUN: 15 mg/dL (ref 6–23)
Basophils Relative: 0 % (ref 0.0–1.0)
CO2: 31 meq/L (ref 19–32)
Chloride: 103 meq/L (ref 96–112)
Creatinine, Ser: 1.2 mg/dL (ref 0.4–1.5)
Creatinine,U: 158.9 mg/dL
Eosinophils Relative: 1.8 % (ref 0.0–5.0)
GFR calc non Af Amer: 64 mL/min
Glucose, Bld: 172 mg/dL — ABNORMAL HIGH (ref 70–99)
HDL: 27.8 mg/dL — ABNORMAL LOW (ref >39.0)
Hgb A1c MFr Bld: 9.3 % — ABNORMAL HIGH (ref 4.6–6.0)
Lymphocytes Relative: 18 % (ref 12.0–46.0)
MCV: 82.4 fL (ref 78.0–100.0)
Monocytes Relative: 7.3 % (ref 3.0–12.0)
Neutrophils Relative %: 72.9 % (ref 43.0–77.0)
Platelets: 193 10*3/uL (ref 150–400)
Potassium: 3.2 meq/L — ABNORMAL LOW (ref 3.5–5.1)
RBC: 5 M/uL (ref 4.22–5.81)
Total CHOL/HDL Ratio: 4.7
VLDL: 37 mg/dL (ref 0–40)
WBC: 11.8 10*3/uL — ABNORMAL HIGH (ref 4.5–10.5)

## 2007-07-23 ENCOUNTER — Ambulatory Visit: Payer: Self-pay | Admitting: Family Medicine

## 2007-07-23 ENCOUNTER — Telehealth: Payer: Self-pay | Admitting: Family Medicine

## 2007-08-13 ENCOUNTER — Telehealth: Payer: Self-pay | Admitting: Family Medicine

## 2007-08-19 ENCOUNTER — Ambulatory Visit: Payer: Self-pay | Admitting: Family Medicine

## 2007-08-19 LAB — CONVERTED CEMR LAB
BUN: 14 mg/dL (ref 6–23)
Chloride: 102 meq/L (ref 96–112)
GFR calc Af Amer: 77 mL/min
GFR calc non Af Amer: 64 mL/min
Glucose, Bld: 138 mg/dL — ABNORMAL HIGH (ref 70–99)
Potassium: 3.9 meq/L (ref 3.5–5.1)
Sodium: 140 meq/L (ref 135–145)

## 2007-08-24 ENCOUNTER — Ambulatory Visit: Payer: Self-pay | Admitting: Family Medicine

## 2007-09-28 ENCOUNTER — Encounter: Payer: Self-pay | Admitting: Family Medicine

## 2007-10-01 ENCOUNTER — Encounter: Payer: Self-pay | Admitting: Family Medicine

## 2007-10-04 ENCOUNTER — Telehealth: Payer: Self-pay | Admitting: Family Medicine

## 2007-10-15 ENCOUNTER — Telehealth: Payer: Self-pay | Admitting: Family Medicine

## 2007-10-24 ENCOUNTER — Observation Stay (HOSPITAL_COMMUNITY): Admission: EM | Admit: 2007-10-24 | Discharge: 2007-10-26 | Payer: Self-pay | Admitting: Emergency Medicine

## 2007-10-24 ENCOUNTER — Encounter: Payer: Self-pay | Admitting: Internal Medicine

## 2007-10-24 ENCOUNTER — Ambulatory Visit: Payer: Self-pay | Admitting: Internal Medicine

## 2007-10-25 ENCOUNTER — Ambulatory Visit: Payer: Self-pay | Admitting: *Deleted

## 2007-10-25 ENCOUNTER — Encounter: Payer: Self-pay | Admitting: Internal Medicine

## 2007-10-26 ENCOUNTER — Encounter (INDEPENDENT_AMBULATORY_CARE_PROVIDER_SITE_OTHER): Payer: Self-pay | Admitting: Internal Medicine

## 2007-10-28 ENCOUNTER — Ambulatory Visit: Payer: Self-pay | Admitting: Family Medicine

## 2007-10-28 LAB — CONVERTED CEMR LAB
INR: 2
Prothrombin Time: 17.5 s

## 2007-11-01 ENCOUNTER — Ambulatory Visit: Payer: Self-pay | Admitting: Family Medicine

## 2007-11-02 LAB — CONVERTED CEMR LAB: INR: 2.9

## 2007-11-15 ENCOUNTER — Encounter: Payer: Self-pay | Admitting: Family Medicine

## 2007-11-17 ENCOUNTER — Telehealth: Payer: Self-pay | Admitting: Family Medicine

## 2007-11-30 ENCOUNTER — Telehealth: Payer: Self-pay | Admitting: Family Medicine

## 2007-12-14 ENCOUNTER — Ambulatory Visit: Payer: Self-pay | Admitting: Family Medicine

## 2007-12-22 ENCOUNTER — Telehealth: Payer: Self-pay | Admitting: Family Medicine

## 2007-12-24 LAB — CONVERTED CEMR LAB
CO2: 31 meq/L (ref 19–32)
Calcium: 9.1 mg/dL (ref 8.4–10.5)
Creatinine, Ser: 1.3 mg/dL (ref 0.4–1.5)
GFR calc Af Amer: 70 mL/min
Glucose, Bld: 202 mg/dL — ABNORMAL HIGH (ref 70–99)

## 2007-12-30 ENCOUNTER — Ambulatory Visit: Payer: Self-pay | Admitting: *Deleted

## 2008-01-19 ENCOUNTER — Telehealth: Payer: Self-pay | Admitting: Family Medicine

## 2008-02-11 ENCOUNTER — Telehealth: Payer: Self-pay | Admitting: Internal Medicine

## 2008-04-17 ENCOUNTER — Telehealth: Payer: Self-pay | Admitting: Family Medicine

## 2008-05-17 ENCOUNTER — Telehealth: Payer: Self-pay | Admitting: Family Medicine

## 2008-05-30 ENCOUNTER — Encounter: Payer: Self-pay | Admitting: Family Medicine

## 2008-06-05 ENCOUNTER — Ambulatory Visit: Payer: Self-pay | Admitting: Family Medicine

## 2008-07-06 ENCOUNTER — Ambulatory Visit: Payer: Self-pay | Admitting: *Deleted

## 2008-09-06 ENCOUNTER — Ambulatory Visit: Payer: Self-pay | Admitting: Family Medicine

## 2008-09-06 DIAGNOSIS — IMO0002 Reserved for concepts with insufficient information to code with codable children: Secondary | ICD-10-CM | POA: Insufficient documentation

## 2008-09-28 ENCOUNTER — Encounter: Payer: Self-pay | Admitting: Family Medicine

## 2008-09-28 ENCOUNTER — Telehealth: Payer: Self-pay | Admitting: Family Medicine

## 2008-09-29 ENCOUNTER — Inpatient Hospital Stay (HOSPITAL_COMMUNITY): Admission: EM | Admit: 2008-09-29 | Discharge: 2008-10-01 | Payer: Self-pay | Admitting: Emergency Medicine

## 2008-10-04 ENCOUNTER — Ambulatory Visit: Payer: Self-pay | Admitting: Family Medicine

## 2008-10-18 ENCOUNTER — Ambulatory Visit: Payer: Self-pay | Admitting: Family Medicine

## 2008-10-18 LAB — CONVERTED CEMR LAB
Albumin: 3.8 g/dL (ref 3.5–5.2)
CO2: 32 meq/L (ref 19–32)
Calcium: 9.1 mg/dL (ref 8.4–10.5)
Creatinine, Ser: 1.2 mg/dL (ref 0.4–1.5)
GFR calc non Af Amer: 63.65 mL/min (ref >60)
Glucose, Bld: 116 mg/dL — ABNORMAL HIGH (ref 70–99)
Total Protein: 7 g/dL (ref 6.0–8.3)

## 2008-11-20 ENCOUNTER — Ambulatory Visit: Payer: Self-pay | Admitting: Family Medicine

## 2008-12-01 ENCOUNTER — Telehealth (INDEPENDENT_AMBULATORY_CARE_PROVIDER_SITE_OTHER): Payer: Self-pay | Admitting: Internal Medicine

## 2009-01-16 ENCOUNTER — Telehealth: Payer: Self-pay | Admitting: Family Medicine

## 2009-02-08 ENCOUNTER — Ambulatory Visit: Payer: Self-pay | Admitting: Surgery

## 2009-02-20 ENCOUNTER — Ambulatory Visit: Payer: Self-pay | Admitting: Family Medicine

## 2009-03-14 ENCOUNTER — Telehealth: Payer: Self-pay | Admitting: Family Medicine

## 2009-03-21 ENCOUNTER — Telehealth: Payer: Self-pay | Admitting: Family Medicine

## 2009-04-27 ENCOUNTER — Ambulatory Visit: Payer: Self-pay | Admitting: Family Medicine

## 2009-04-27 ENCOUNTER — Encounter: Payer: Self-pay | Admitting: Family Medicine

## 2009-04-27 DIAGNOSIS — I872 Venous insufficiency (chronic) (peripheral): Secondary | ICD-10-CM | POA: Insufficient documentation

## 2009-04-27 LAB — CONVERTED CEMR LAB
BUN: 12 mg/dL (ref 6–23)
CO2: 21 meq/L (ref 19–32)
Chloride: 107 meq/L (ref 96–112)
Glucose, Bld: 160 mg/dL — ABNORMAL HIGH (ref 70–99)
Hemoglobin: 12.9 g/dL — ABNORMAL LOW (ref 13.0–17.0)
Potassium: 4.4 meq/L (ref 3.5–5.3)
RBC: 5.05 M/uL (ref 4.22–5.81)
WBC: 11.8 10*3/uL — ABNORMAL HIGH (ref 4.0–10.5)

## 2009-05-01 ENCOUNTER — Encounter (HOSPITAL_BASED_OUTPATIENT_CLINIC_OR_DEPARTMENT_OTHER): Admission: RE | Admit: 2009-05-01 | Discharge: 2009-07-02 | Payer: Self-pay | Admitting: Internal Medicine

## 2009-05-11 ENCOUNTER — Ambulatory Visit (HOSPITAL_COMMUNITY): Admission: RE | Admit: 2009-05-11 | Discharge: 2009-05-11 | Payer: Self-pay | Admitting: Internal Medicine

## 2009-05-21 ENCOUNTER — Telehealth: Payer: Self-pay | Admitting: Family Medicine

## 2009-06-09 ENCOUNTER — Ambulatory Visit (HOSPITAL_COMMUNITY): Admission: RE | Admit: 2009-06-09 | Discharge: 2009-06-09 | Payer: Self-pay | Admitting: Internal Medicine

## 2009-06-22 ENCOUNTER — Encounter: Payer: Self-pay | Admitting: Family Medicine

## 2009-07-06 ENCOUNTER — Ambulatory Visit: Payer: Self-pay | Admitting: Family Medicine

## 2009-07-12 ENCOUNTER — Encounter: Payer: Self-pay | Admitting: Family Medicine

## 2009-07-16 ENCOUNTER — Telehealth (INDEPENDENT_AMBULATORY_CARE_PROVIDER_SITE_OTHER): Payer: Self-pay | Admitting: *Deleted

## 2009-08-02 ENCOUNTER — Ambulatory Visit: Payer: Self-pay | Admitting: Family Medicine

## 2009-08-02 LAB — CONVERTED CEMR LAB: INR: 2.3

## 2009-08-08 ENCOUNTER — Telehealth: Payer: Self-pay | Admitting: Family Medicine

## 2009-08-28 ENCOUNTER — Encounter: Payer: Self-pay | Admitting: Family Medicine

## 2009-09-05 ENCOUNTER — Encounter: Payer: Self-pay | Admitting: Family Medicine

## 2009-09-05 ENCOUNTER — Ambulatory Visit: Payer: Self-pay | Admitting: Family Medicine

## 2009-09-05 LAB — CONVERTED CEMR LAB
BUN: 13 mg/dL (ref 6–23)
Chloride: 104 meq/L (ref 96–112)
Creatinine, Ser: 0.95 mg/dL (ref 0.40–1.50)
HCT: 44.1 % (ref 39.0–52.0)
Hemoglobin: 14.3 g/dL (ref 13.0–17.0)
MCHC: 32.4 g/dL (ref 30.0–36.0)
MCV: 82 fL (ref 78.0–100.0)
RDW: 17.4 % — ABNORMAL HIGH (ref 11.5–15.5)

## 2009-09-17 ENCOUNTER — Encounter: Payer: Self-pay | Admitting: Family Medicine

## 2009-09-17 ENCOUNTER — Ambulatory Visit: Payer: Self-pay | Admitting: Family Medicine

## 2009-09-20 ENCOUNTER — Ambulatory Visit: Payer: Self-pay | Admitting: Family Medicine

## 2009-09-27 ENCOUNTER — Ambulatory Visit: Payer: Self-pay | Admitting: Family Medicine

## 2009-10-01 ENCOUNTER — Ambulatory Visit: Payer: Self-pay | Admitting: Surgery

## 2009-10-01 ENCOUNTER — Ambulatory Visit: Payer: Self-pay | Admitting: Family Medicine

## 2009-10-01 LAB — CONVERTED CEMR LAB: INR: 1.8

## 2009-10-09 ENCOUNTER — Encounter: Payer: Self-pay | Admitting: Family Medicine

## 2009-10-17 ENCOUNTER — Ambulatory Visit: Payer: Self-pay | Admitting: Family Medicine

## 2009-10-31 ENCOUNTER — Ambulatory Visit: Payer: Self-pay | Admitting: Family Medicine

## 2009-11-02 ENCOUNTER — Ambulatory Visit: Payer: Self-pay | Admitting: Family Medicine

## 2009-11-02 ENCOUNTER — Encounter: Payer: Self-pay | Admitting: Family Medicine

## 2009-11-02 DIAGNOSIS — R42 Dizziness and giddiness: Secondary | ICD-10-CM | POA: Insufficient documentation

## 2009-11-14 ENCOUNTER — Ambulatory Visit: Payer: Self-pay | Admitting: Family Medicine

## 2009-11-19 ENCOUNTER — Ambulatory Visit: Payer: Self-pay | Admitting: Family Medicine

## 2009-11-19 ENCOUNTER — Ambulatory Visit (HOSPITAL_COMMUNITY): Admission: RE | Admit: 2009-11-19 | Discharge: 2009-11-19 | Payer: Self-pay | Admitting: Family Medicine

## 2009-11-19 ENCOUNTER — Encounter: Payer: Self-pay | Admitting: Family Medicine

## 2009-11-19 ENCOUNTER — Telehealth: Payer: Self-pay | Admitting: *Deleted

## 2009-11-19 DIAGNOSIS — M25511 Pain in right shoulder: Secondary | ICD-10-CM

## 2009-11-19 DIAGNOSIS — M25512 Pain in left shoulder: Secondary | ICD-10-CM | POA: Insufficient documentation

## 2009-11-19 LAB — CONVERTED CEMR LAB: Hgb A1c MFr Bld: 7.8 %

## 2009-11-22 HISTORY — PX: DOPPLER ECHOCARDIOGRAPHY: SHX263

## 2009-11-29 ENCOUNTER — Ambulatory Visit: Payer: Self-pay | Admitting: Cardiology

## 2009-12-05 ENCOUNTER — Ambulatory Visit: Payer: Self-pay | Admitting: Family Medicine

## 2009-12-05 LAB — CONVERTED CEMR LAB: INR: 2.1

## 2009-12-09 ENCOUNTER — Encounter: Payer: Self-pay | Admitting: Family Medicine

## 2009-12-09 ENCOUNTER — Ambulatory Visit: Payer: Self-pay | Admitting: Cardiology

## 2009-12-09 ENCOUNTER — Ambulatory Visit: Payer: Self-pay | Admitting: Family Medicine

## 2009-12-09 ENCOUNTER — Inpatient Hospital Stay (HOSPITAL_COMMUNITY): Admission: EM | Admit: 2009-12-09 | Discharge: 2009-12-22 | Payer: Self-pay | Admitting: Emergency Medicine

## 2009-12-11 ENCOUNTER — Encounter: Payer: Self-pay | Admitting: Family Medicine

## 2009-12-13 ENCOUNTER — Ambulatory Visit: Payer: Self-pay | Admitting: Infectious Disease

## 2009-12-13 ENCOUNTER — Encounter: Payer: Self-pay | Admitting: Cardiology

## 2009-12-14 ENCOUNTER — Encounter: Payer: Self-pay | Admitting: Internal Medicine

## 2009-12-18 ENCOUNTER — Other Ambulatory Visit: Payer: Self-pay | Admitting: Cardiology

## 2009-12-18 ENCOUNTER — Encounter: Payer: Self-pay | Admitting: Family Medicine

## 2009-12-18 ENCOUNTER — Ambulatory Visit: Payer: Self-pay | Admitting: Thoracic Surgery

## 2009-12-20 ENCOUNTER — Encounter: Payer: Self-pay | Admitting: Family Medicine

## 2009-12-20 DIAGNOSIS — E1149 Type 2 diabetes mellitus with other diabetic neurological complication: Secondary | ICD-10-CM

## 2009-12-24 ENCOUNTER — Encounter: Payer: Self-pay | Admitting: Infectious Disease

## 2009-12-24 ENCOUNTER — Telehealth: Payer: Self-pay | Admitting: Infectious Disease

## 2009-12-26 ENCOUNTER — Ambulatory Visit: Payer: Self-pay | Admitting: Family Medicine

## 2009-12-27 ENCOUNTER — Telehealth: Payer: Self-pay | Admitting: Infectious Disease

## 2009-12-27 DIAGNOSIS — M009 Pyogenic arthritis, unspecified: Secondary | ICD-10-CM | POA: Insufficient documentation

## 2009-12-27 DIAGNOSIS — A4101 Sepsis due to Methicillin susceptible Staphylococcus aureus: Secondary | ICD-10-CM

## 2009-12-27 DIAGNOSIS — M869 Osteomyelitis, unspecified: Secondary | ICD-10-CM | POA: Insufficient documentation

## 2009-12-31 ENCOUNTER — Ambulatory Visit: Payer: Self-pay | Admitting: Family Medicine

## 2009-12-31 ENCOUNTER — Encounter: Payer: Self-pay | Admitting: Infectious Disease

## 2010-01-07 ENCOUNTER — Encounter: Payer: Self-pay | Admitting: Infectious Disease

## 2010-01-07 ENCOUNTER — Telehealth: Payer: Self-pay

## 2010-01-08 ENCOUNTER — Ambulatory Visit: Payer: Self-pay | Admitting: Thoracic Surgery

## 2010-01-09 ENCOUNTER — Encounter: Payer: Self-pay | Admitting: Infectious Disease

## 2010-01-10 ENCOUNTER — Ambulatory Visit: Payer: Self-pay | Admitting: Family Medicine

## 2010-01-10 ENCOUNTER — Encounter: Payer: Self-pay | Admitting: Cardiology

## 2010-01-10 LAB — CONVERTED CEMR LAB: INR: 2

## 2010-01-11 ENCOUNTER — Encounter: Payer: Self-pay | Admitting: Cardiology

## 2010-01-11 ENCOUNTER — Ambulatory Visit: Payer: Self-pay | Admitting: Cardiology

## 2010-01-14 ENCOUNTER — Encounter: Payer: Self-pay | Admitting: Infectious Disease

## 2010-01-21 ENCOUNTER — Telehealth: Payer: Self-pay

## 2010-01-22 ENCOUNTER — Encounter: Payer: Self-pay | Admitting: Infectious Disease

## 2010-01-23 ENCOUNTER — Ambulatory Visit: Payer: Self-pay | Admitting: Family Medicine

## 2010-01-23 ENCOUNTER — Ambulatory Visit: Payer: Self-pay | Admitting: Thoracic Surgery

## 2010-01-28 ENCOUNTER — Telehealth: Payer: Self-pay | Admitting: Infectious Disease

## 2010-01-30 ENCOUNTER — Encounter: Payer: Self-pay | Admitting: Infectious Disease

## 2010-01-30 ENCOUNTER — Ambulatory Visit: Payer: Self-pay | Admitting: Family Medicine

## 2010-02-05 ENCOUNTER — Encounter: Payer: Self-pay | Admitting: Infectious Disease

## 2010-02-06 ENCOUNTER — Telehealth: Payer: Self-pay | Admitting: Infectious Disease

## 2010-02-06 ENCOUNTER — Ambulatory Visit: Payer: Self-pay | Admitting: Family Medicine

## 2010-02-07 ENCOUNTER — Encounter: Payer: Self-pay | Admitting: Family Medicine

## 2010-02-07 ENCOUNTER — Ambulatory Visit: Payer: Self-pay | Admitting: Infectious Disease

## 2010-02-07 ENCOUNTER — Telehealth: Payer: Self-pay | Admitting: *Deleted

## 2010-02-07 DIAGNOSIS — A4902 Methicillin resistant Staphylococcus aureus infection, unspecified site: Secondary | ICD-10-CM

## 2010-02-07 DIAGNOSIS — I38 Endocarditis, valve unspecified: Secondary | ICD-10-CM | POA: Insufficient documentation

## 2010-02-07 LAB — CONVERTED CEMR LAB
BUN: 10 mg/dL (ref 6–23)
Basophils Absolute: 0 10*3/uL (ref 0.0–0.1)
Basophils Relative: 0 % (ref 0–1)
CO2: 28 meq/L (ref 19–32)
Chloride: 105 meq/L (ref 96–112)
Creatinine, Ser: 1.04 mg/dL (ref 0.40–1.50)
Glucose, Bld: 197 mg/dL — ABNORMAL HIGH (ref 70–99)
Hemoglobin: 12.6 g/dL — ABNORMAL LOW (ref 13.0–17.0)
MCHC: 31.1 g/dL (ref 30.0–36.0)
Monocytes Absolute: 0.7 10*3/uL (ref 0.1–1.0)
Neutro Abs: 8.2 10*3/uL — ABNORMAL HIGH (ref 1.7–7.7)
Neutrophils Relative %: 78 % — ABNORMAL HIGH (ref 43–77)
Platelets: 196 10*3/uL (ref 150–400)
RDW: 17.7 % — ABNORMAL HIGH (ref 11.5–15.5)

## 2010-02-12 ENCOUNTER — Encounter: Payer: Self-pay | Admitting: Infectious Disease

## 2010-02-20 ENCOUNTER — Ambulatory Visit: Payer: Self-pay | Admitting: Family Medicine

## 2010-02-20 LAB — CONVERTED CEMR LAB: INR: 2.1

## 2010-02-25 ENCOUNTER — Ambulatory Visit: Payer: Self-pay | Admitting: Family Medicine

## 2010-02-25 DIAGNOSIS — M7500 Adhesive capsulitis of unspecified shoulder: Secondary | ICD-10-CM | POA: Insufficient documentation

## 2010-03-06 ENCOUNTER — Ambulatory Visit: Payer: Self-pay | Admitting: Family Medicine

## 2010-03-06 ENCOUNTER — Encounter: Payer: Self-pay | Admitting: Infectious Disease

## 2010-03-06 ENCOUNTER — Ambulatory Visit: Payer: Self-pay | Admitting: Infectious Disease

## 2010-03-06 DIAGNOSIS — R5383 Other fatigue: Secondary | ICD-10-CM

## 2010-03-06 DIAGNOSIS — R5381 Other malaise: Secondary | ICD-10-CM | POA: Insufficient documentation

## 2010-03-06 LAB — CONVERTED CEMR LAB
BUN: 19 mg/dL (ref 6–23)
CRP: 0.7 mg/dL — ABNORMAL HIGH (ref <0.6)
Creatinine, Ser: 0.93 mg/dL (ref 0.40–1.50)
Glucose, Urine, Semiquant: 500
Hemoglobin: 14.7 g/dL (ref 13.0–17.0)
INR: 1.3
Ketones, urine, test strip: NEGATIVE
Lymphs Abs: 1.6 10*3/uL (ref 0.7–4.0)
Monocytes Relative: 7 % (ref 3–12)
Neutro Abs: 9.9 10*3/uL — ABNORMAL HIGH (ref 1.7–7.7)
Neutrophils Relative %: 79 % — ABNORMAL HIGH (ref 43–77)
Nitrite: NEGATIVE
Potassium: 4.5 meq/L (ref 3.5–5.3)
RBC: 5.89 M/uL — ABNORMAL HIGH (ref 4.22–5.81)
Specific Gravity, Urine: 1.025
WBC: 12.6 10*3/uL — ABNORMAL HIGH (ref 4.0–10.5)
pH: 6

## 2010-03-08 ENCOUNTER — Ambulatory Visit: Payer: Self-pay | Admitting: Family Medicine

## 2010-03-13 ENCOUNTER — Ambulatory Visit: Payer: Self-pay | Admitting: Family Medicine

## 2010-03-13 LAB — CONVERTED CEMR LAB: INR: 1.9

## 2010-03-20 ENCOUNTER — Ambulatory Visit: Payer: Self-pay

## 2010-03-22 ENCOUNTER — Ambulatory Visit
Admission: RE | Admit: 2010-03-22 | Discharge: 2010-03-22 | Payer: Self-pay | Source: Home / Self Care | Attending: Family Medicine | Admitting: Family Medicine

## 2010-03-22 LAB — CONVERTED CEMR LAB: INR: 3.2

## 2010-03-24 DIAGNOSIS — M869 Osteomyelitis, unspecified: Secondary | ICD-10-CM

## 2010-03-24 HISTORY — DX: Osteomyelitis, unspecified: M86.9

## 2010-03-29 ENCOUNTER — Ambulatory Visit: Admission: RE | Admit: 2010-03-29 | Discharge: 2010-03-29 | Payer: Self-pay | Source: Home / Self Care

## 2010-03-29 LAB — CONVERTED CEMR LAB: INR: 2.7

## 2010-04-08 ENCOUNTER — Ambulatory Visit: Admit: 2010-04-08 | Payer: Self-pay | Admitting: Family Medicine

## 2010-04-12 ENCOUNTER — Ambulatory Visit: Admission: RE | Admit: 2010-04-12 | Discharge: 2010-04-12 | Payer: Self-pay | Source: Home / Self Care

## 2010-04-12 DIAGNOSIS — Z8672 Personal history of thrombophlebitis: Secondary | ICD-10-CM | POA: Insufficient documentation

## 2010-04-12 LAB — CONVERTED CEMR LAB: INR: 3.1

## 2010-04-17 ENCOUNTER — Encounter: Payer: Self-pay | Admitting: Infectious Disease

## 2010-04-17 ENCOUNTER — Ambulatory Visit
Admission: RE | Admit: 2010-04-17 | Discharge: 2010-04-17 | Payer: Self-pay | Source: Home / Self Care | Attending: Infectious Disease | Admitting: Infectious Disease

## 2010-04-17 LAB — CONVERTED CEMR LAB
ALT: 19 units/L (ref 0–53)
BUN: 15 mg/dL (ref 6–23)
CO2: 25 meq/L (ref 19–32)
Calcium: 9.1 mg/dL (ref 8.4–10.5)
Chloride: 101 meq/L (ref 96–112)
Creatinine, Ser: 1 mg/dL (ref 0.40–1.50)
Eosinophils Relative: 2 % (ref 0–5)
Glucose, Bld: 230 mg/dL — ABNORMAL HIGH (ref 70–99)
HCT: 46.1 % (ref 39.0–52.0)
Hemoglobin: 14.4 g/dL (ref 13.0–17.0)
Lymphocytes Relative: 16 % (ref 12–46)
Lymphs Abs: 1.7 10*3/uL (ref 0.7–4.0)
Monocytes Relative: 9 % (ref 3–12)
Platelets: 166 10*3/uL (ref 150–400)
RBC: 5.65 M/uL (ref 4.22–5.81)
Sed Rate: 4 mm/hr (ref 0–16)
WBC: 10.4 10*3/uL (ref 4.0–10.5)

## 2010-04-18 ENCOUNTER — Other Ambulatory Visit: Payer: Self-pay | Admitting: Infectious Disease

## 2010-04-18 DIAGNOSIS — M869 Osteomyelitis, unspecified: Secondary | ICD-10-CM

## 2010-04-19 ENCOUNTER — Ambulatory Visit: Admit: 2010-04-19 | Payer: Self-pay

## 2010-04-22 ENCOUNTER — Ambulatory Visit
Admission: RE | Admit: 2010-04-22 | Discharge: 2010-04-22 | Payer: Self-pay | Source: Home / Self Care | Attending: Family Medicine | Admitting: Family Medicine

## 2010-04-23 NOTE — Miscellaneous (Signed)
  Clinical Lists Changes  Observations: Added new observation of ECHOINTERP:  - Left ventricle: Systolic function was mildly reduced. The     estimated ejection fraction was in the range of 45% to 50%.   - Aortic valve: No evidence of vegetation.   - Mitral valve: No evidence of vegetation. Mild regurgitation.   - Atrial septum: There was increased thickness of the septum,     consistent with lipomatous hypertrophy.   - Pericardium, extracardiac: A small pericardial effusion was     identified. (12/14/2009 14:57)      Echocardiogram  Procedure date:  12/14/2009  Findings:       - Left ventricle: Systolic function was mildly reduced. The     estimated ejection fraction was in the range of 45% to 50%.   - Aortic valve: No evidence of vegetation.   - Mitral valve: No evidence of vegetation. Mild regurgitation.   - Atrial septum: There was increased thickness of the septum,     consistent with lipomatous hypertrophy.   - Pericardium, extracardiac: A small pericardial effusion was     identified.
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
## 2010-04-23 NOTE — Progress Notes (Signed)
Summary: triage  Phone Note Call from Patient Call back at Home Phone [phone]   Caller: Patient Summary of Call: Pt returning Sally's call from today. Initial call taken by: Clydell Hakim,  July 16, 2009 2:42 PM  Follow-up for Phone Call        LM. he has forms here for a dm supply company. Needs appt & current A1C Follow-up by: [name] RN,  July 16, 2009 3:00 PM  Additional Follow-up for Phone Call Additional follow up Details #1::        Pt has f/up appt with Dr. Gwendolyn Grant May 12th. Additional Follow-up by: Clydell Hakim,  July 17, 2009 10:14 AM

## 2010-04-23 NOTE — Assessment & Plan Note (Signed)
Summary: F/U  [name]   Vital Signs:  Patient profile:   70 year old male Height:      67 inches Weight:      287.06 pounds BMI:     45.12 Temp:     98.4 degrees F oral Pulse rate:   91 / minute BP sitting:   143 / 76  (left arm) Cuff size:   large  Vitals Entered By: Jimmy Footman, CMA (January 30, 2010 1:30 PM) CC: f/u Is Patient Diabetic? Yes Did you bring your meter with you today? No   Primary Care Liv Rallis:  Renold Don MD  CC:  f/u.  History of Present Illness: 1.  Depression:  Starts crying for now reason, smallest thing can set him off.  Takes Xanax with intermittent relief when he starts crying.  STates he feels depressed most of the time.  Worsens since being diagnosed with sternoclavicular infection and prolonged hospitalization.  Is moving to a new community where people are more outgoing, thinks this may help.  Would like something for improvement.    2.  Rash:  across nasal bridge, forehead.  Some relief with Vaseline.  Red rash that started about 1 week ago after last vanc infusion.  No itching.  NO other lesions.   ROS:  no headaches, pre-syncopal or syncopal episodes, chest pain, palpitations, shortness of breath or dyspnea, abdominal pain, diarrhea or constipation, melena, hematochezia, lower extremity swelling.      Habits & Providers  Alcohol-Tobacco-Diet     Tobacco Status: quit  Current Problems (verified): 1)  Osteomyelitis  (ICD-730.20) 2)  Septic Arthritis  (ICD-711.00) 3)  Methicillin Susceptible Staph Aureus Septicemia  (ICD-038.11) 4)  Diabetic Peripheral Neuropathy  (ICD-250.60) 5)  Shoulder Pain, Right, Chronic  (ICD-719.41) 6)  Dizziness  (ICD-780.4) 7)  Accidental Fall  (ICD-E888.9) 8)  Encounter For Long-term Use of Anticoagulants  (ICD-V58.61) 9)  Unspecified Venous Insufficiency  (ICD-459.81) 10)  Back Pain, Lumbar, With Radiculopathy  (ICD-724.4) 11)  Diverticulosis, Colon  (ICD-562.10) 12)  Diabetes Mellitus, Type II  (ICD-250.00) 13)   Depression  (ICD-311) 14)  Hypertension  (ICD-401.9) 15)  Reflux Esophagitis  (ICD-530.11) 16)  Hypercholesterolemia  (ICD-272.0) 17)  Cva  (ICD-436) 18)  Coronary, Arteriosclerosis  (ICD-414.00) 19)  COPD  (ICD-496) 20)  CHF - Ejection Fraction < 50%  (ICD-428.22) 21)  Apnea, Sleep  (ICD-780.57) 22)  Anxiety  (ICD-300.00)  Current Medications (verified): 1)  Baby Aspirin 81 Mg  Chew (Aspirin) .... One By Mouth Once Daily 2)  Nitroquick 0.4 Mg  Subl (Nitroglycerin) .... As Needed 3)  Klor-Con M20 20 Meq  Tbcr (Potassium Chloride Crys Cr) .Marland Kitchen.. 1 Tablet Two Times A Day By Mouth 4)  Warfarin Sodium 7.5 Mg Tabs (Warfarin Sodium) .... Take As Directed 5)  Furosemide 40 Mg Tabs (Furosemide) .... Take 1 Pill in Am 6)  Simvastatin 80 Mg Tabs (Simvastatin) .Marland Kitchen.. 1 At Bedtime By Mouth 7)  Humulin N 100 Unit/ml Susp (Insulin Isophane Human) .... 65 Units Subcutaneously Two Times A Day 8)  Onetouch Ultra Test  Strp (Glucose Blood) .... Check Daily As Directed Daily Icd9 Code250.00 9)  Onetouch Lancets  Misc (Lancets) .... Check Daily As Directed  Icd9 Code 250.00 10)  Metoprolol Tartrate 25 Mg Tabs (Metoprolol Tartrate) .... Out 11)  Lisinopril 10 Mg Tabs (Lisinopril) .... Take 1 Pill Daily For High Blood Pressure 12)  Alprazolam 0.5 Mg Tabs (Alprazolam) .... Take 1 Tab Q 12 Hours As Needed Pain 13)  Engineer, water (Misc. Devices) .... Powerized Wheelchair 14)  Acetaminophen-Codeine #3 300-30 Mg Tabs (Acetaminophen-Codeine) .... Take 1 Tab Q 8 Hours As Needed Pain 15)  Warfarin Sodium 5 Mg Tabs (Warfarin Sodium) .... Take As Directed 16)  Bupropion Hcl 150 Mg Xr24h-Tab (Bupropion Hcl) .... Take 1 Daily For Depression  Allergies (verified): 1)  * Valium  Past History:  Past Medical History:  1. CAD, status post CABG in 1992.       a.     Stent to the circumflex vein graft in 1999.       b.     PTCA x2 to the SVG to the RCA, February 2002.       c.     Cath in 2006 showing an occluded vein  graft to the OM,        occluded LIMA to the LAD, occluded vein graft to the RCA, and        severe two-vessel disease to the left main and moderate LAD.   2. 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   3. Obstructive sleep apnea, on CPAP.   4. COPD and history of tobacco abuse.   5. Hypertension.   6. Depression.   7. Insulin-dependent diabetes.   8. Diverticulosis.   9. PE/DVT, on chronic Coumadin, the patient states this occurred 10-15 years ago and he has been maintained on Coumadin ever since.   10.Acid reflux.   11.Hyperlipidemia.   12.Ischemic cardiomyopathy with an EF of 35% in 2007 with abnormal       diastolic relaxation abnormality, but most recent echo 50-55% on  December 11, 2009.   13. 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 Questionable history of CVA    Social History: Smoking Status:  quit  Physical Exam  General:  Vital signs reviewed. Obese male, crying occasionally.    Chest Wall:  well-healed, no further erythema noted Lungs:  clear to auscultation bilaterally without wheezing, rales, or rhonchi.  Normal work of breathing  Heart:  Regular rate and rhythm without murmur, rub, or gallop.  Normal S1/S2  Abdomen:  obese/nontender.  Good bowel sounds Skin:  Malar-like rash across bridge of nose and under eyes.  No desquamation noted, no mucosal involvement, no further lesions.   Psych:  Tearful,  No suicidal or homicidal ideations.     Impression & Recommendations:  Problem # 1:  DEPRESSION (ICD-311) Assessment Deteriorated Plan to start patient on Buproprion.  To follow up in 1 week with me.  Will re-assess improvement at that time.  Have attempted patient on SSRI's in past, but he has refused  to take them.  More open today.   No red flags today, gave warnings for patient to call clinic or go to ED. His updated medication list for this problem includes:    Alprazolam 0.5 Mg Tabs (Alprazolam) .Marland Kitchen... Take 1 tab q 12 hours as needed pain    Bupropion Hcl 150 Mg Xr24h-tab (Bupropion hcl) .Marland Kitchen... Take 1 daily for depression  Orders: [facility]- Est  Level 4 ([address]109)  Problem # 2:  SEPTIC ARTHRITIS (ICD-711.00) Assessment: Improved No further signs or symptoms.  Continues long-term Vanc as outpatient,  concern for possible endocarditis.  Will continue to follow, along with infectious disease.  Appreciate their input.  Concern for possible mild form of red man syndrome with Vanc.  Recommended patient take Benadryl prior to Vanc infusions, use Benadryl for relief at home.  Warned it may cause drowsiness.  Expressed understanding.   Orders: [facility]- Est  Level 4 (98119)  Problem # 3:  SHOULDER PAIN, RIGHT, CHRONIC (ICD-719.41) Assessment: Unchanged Still with pain.  Will re-assess next visit.  Most likely will need referral to Sports Med, long term problem for patient.  Concern for frozen shoulder.  FU next week.   His updated medication list for this problem includes:    Baby Aspirin 81 Mg Chew (Aspirin) ..... One by mouth once daily    Acetaminophen-codeine #3 300-30 Mg Tabs (Acetaminophen-codeine) .Marland Kitchen... Take 1 tab q 8 hours as needed pain  Complete Medication List: 1)  Baby Aspirin 81 Mg Chew (Aspirin) .... One by mouth once daily 2)  Nitroquick 0.4 Mg Subl (Nitroglycerin) .... As needed 3)  Klor-con M20 20 Meq Tbcr (Potassium chloride crys cr) .Marland Kitchen.. 1 tablet two times a day by mouth 4)  Warfarin Sodium 7.5 Mg Tabs (Warfarin sodium) .... Take as directed 5)  Furosemide 40 Mg Tabs (Furosemide) .... Take 1 pill in am 6)  Simvastatin 80 Mg Tabs (Simvastatin) .Marland Kitchen.. 1 at bedtime by mouth 7)  Humulin N 100 Unit/ml Susp (Insulin isophane human) .... 65 units subcutaneously two times a day 8)  Onetouch Ultra  Test Strp (Glucose blood) .... Check daily as directed daily icd9 code250.00 9)  Onetouch Lancets Misc (Lancets) .... Check daily as directed  icd9 code 250.00 10)  Metoprolol Tartrate 25 Mg Tabs (Metoprolol tartrate) .... Out 11)  Lisinopril 10 Mg Tabs (Lisinopril) .... Take 1 pill daily for high blood pressure 12)  Alprazolam 0.5 Mg Tabs (Alprazolam) .... Take 1 tab q 12 hours as needed pain 13)  Wheelchair Misc (Misc. devices) .... Powerized wheelchair 14)  Acetaminophen-codeine #3 300-30 Mg Tabs (Acetaminophen-codeine) .... Take 1 tab q 8 hours as needed pain 15)  Warfarin Sodium 5 Mg Tabs (Warfarin sodium) .... Take as directed 16)  Bupropion Hcl 150 Mg Xr24h-tab (Bupropion hcl) .... Take 1 daily for depression  Patient Instructions: 1)  Take the Benadryl every 6-8 hours as needed for the redness. 2)  Take the Buproprion 1 daily for depression.  3)  Come back and see me next week.  Prescriptions: BUPROPION HCL 150 MG XR24H-TAB (BUPROPION HCL) Take 1 daily for depression  #30 x 0   Entered and Authorized by:   Renold Don MD   Signed by:   Renold Don MD on 01/30/2010   Method used:   Electronically to        [facility] [address] [id number]* (retail)       [address] [place], [place]  [address]       Ph: [phone]       Fax: [phone]   RxID:   [id number]    Orders Added: 1)  [facility]- Est  Level 4 [47425]

## 2010-04-23 NOTE — Progress Notes (Signed)
Summary: Rx Req  Phone Note Refill Request Call back at Home Phone [phone] Message from:  Patient  Refills Requested: Medication #1:  HUMULIN N 100 UNIT/ML SUSP 65 units Subcutaneously two times a day PT USES [facility] [address] PT IS OUT.  Initial call taken by: Clydell Hakim,  May 21, 2009 9:49 AM Caller: Patient    Prescriptions: HUMULIN N 100 UNIT/ML SUSP (INSULIN ISOPHANE HUMAN) 65 units Subcutaneously two times a day  #3 months x 3   Entered and Authorized by:   Renold Don MD   Signed by:   Renold Don MD on 05/21/2009   Method used:   Electronically to        [facility] [address] #[id number]* (retail)       [address] [place], [place]  [address]       Ph: [phone]       Fax: [phone]   RxID:   [id number]

## 2010-04-23 NOTE — Miscellaneous (Signed)
Summary: HIPAA Restrictions  HIPAA Restrictions   Imported By: Florinda Marker 02/12/2010 08:59:53  _____________________________________________________________________  External Attachment:    Type:   Image     Comment:   External Document

## 2010-04-23 NOTE — Progress Notes (Signed)
Summary: Problems with lab draw   Phone Note From Other Clinic   Caller: Gentiva-Christian RN  [phone] Summary of Call:  Voicemail: Problems today drawing labs.  Nurse was not able to draw labs due to  small veins.  She will send a different nurse on Tuesday to draw labs. PICC education was given due to cap was missing from extension tube. Pt was not sure how long it was missing or what happened.  Tomasita Morrow RN  January 21, 2010 10:53 AM
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
## 2010-04-23 NOTE — Miscellaneous (Signed)
  Clinical Lists Changes  Patient requires power mobility device.  Saw patient in hospital.  Mobility severely limited.  Paperwork completed and mailed back.  Renold Don MD  December 20, 2009 3:54 PM   Medications: Added new medication of WHEELCHAIR  MISC (MISC. DEVICES) Powerized wheelchair

## 2010-04-23 NOTE — Assessment & Plan Note (Signed)
Summary: f/u eo   Vital Signs:  Patient profile:   70 year old male Height:      67 inches Weight:      287 pounds BMI:     45.11 Pulse rate:   72 / minute BP sitting:   133 / 77  (left arm) Cuff size:   large  Vitals Entered By: Jimmy Footman, CMA (November 19, 2009 10:35 AM) CC: f/u falling, dizziness Is Patient Diabetic? Yes    Primary Care Provider:  Renold Don MD  CC:  f/u falling and dizziness.  History of Present Illness: 1.  Dizziness:  Patient still with dizziness.  Reports pre-syncopal history.  Lightheaded when he tries to stand.  If he does not "let my head clear" when first standing, he loses his balance.  Falls have been more losing his balance and having to catch himself on furniture rather than true fall to floor.  No history of hitting his head.  States he is urinating every 1 1/2 - 2 hours.  2.  Right shoulder pain:  Patient states shoulder pain persists.  Can only sleep on his left side due to pain.  No decreased sensation distally or in shoulder.  No weakness. Does endorse inability to lift arm past shoulder height due to pain.  History of reconstructive surgery in shoulder x 2 remotely.    ROS:  no headaches, chest pain, palpitations, shortness of breath or dyspnea, abdominal pain, diarrhea or constipation, melena, hematochezia.     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 As Directed 2)  Baby Aspirin 81 Mg  Chew (Aspirin) .... One By Mouth Once Daily 3)  Nitroquick 0.4 Mg  Subl (Nitroglycerin) .... As Needed 4)  Klor-Con M20 20 Meq  Tbcr (Potassium Chloride Crys Cr) .Marland Kitchen.. 1 Tablet Two Times A Day By Mouth 5)  Warfarin Sodium 7.5 Mg Tabs (Warfarin Sodium) .... Take As Directed 6)  Furosemide 40 Mg Tabs (Furosemide) .... Take 1 Pill in Am 7)  Simvastatin 80 Mg Tabs (Simvastatin) .Marland Kitchen.. 1 At Bedtime By Mouth 8)  Humulin N 100 Unit/ml Susp (Insulin Isophane Human) .... 65 Units Subcutaneously Two Times A Day 9)  Onetouch Ultra Test  Strp (Glucose Blood) .... Check Daily As Directed Daily Icd9 Code250.00 10)  Onetouch Lancets  Misc (Lancets) .... Check Daily As Directed  Icd9 Code 250.00 11)  Metoprolol Tartrate 25 Mg Tabs (Metoprolol Tartrate) .Marland Kitchen.. 1 Tab By Mouth  Daily For High Blood Pressure 12)  Hydrocodone-Acetaminophen 5-500 Mg Tabs (Hydrocodone-Acetaminophen) .... Take 1 Pill Every 6 Hours If Needed For Pain 13)  Lisinopril 10 Mg Tabs (Lisinopril) .... Take 1 Pill Daily For High Blood Pressure  Allergies (verified): 1)  * Valium  Physical Exam  General:  Vital signs reviewed. Well-developed, well-nourished patient in NAD.  Awake and cooperative  Lungs:  clear to auscultation bilaterally without wheezing, rales, or rhonchi.  Normal work of breathing  Heart:  Regular rate and rhythm without murmur, rub, or gallop.  Normal S1/S2  Msk:  decreasd passive and active range of motion.  Inability to  lift arm past shoulder height.  Cannot put hand behind his back.  Decreased external rotation.   Extremities:  Chronic venous stasis changes present.   Neurologic:  No focal deficits noted.   Impression & Recommendations:  Problem # 1:  DIZZINESS (ICD-780.4) Assessment Improved Dizziness has improved since stopping Tramadol, however has not totally decreased.  Seems most likely to be orthostasis with history of recurrent urination both day and night and consistent lightheadedness when standing.  EKG obtained, no change from prior study.  Only endorses history of about 3-4 cups water per day, does not want to drink more b/c he does not want to have to urinate any more frequently.  Decreased his Lasix dose and recommended increasing his fluid intake especially in heat..  Will see him back in 1 week to make sure he is improving.  See below for further discussion on management of CHF.  Orders: [facility]- Est  Level 4 (16109)  Problem # 2:  CHF - EJECTION FRACTION < 50% (ICD-428.22) Assessment: Unchanged Patient needs to be seen by cardiologist.  Extensive cardiac history.  Has not seen cardiologist in over a year due to insurance problems.  Plan to refer to cardiologist who will be covered.  Added Lisinipril to med list today for renal protection.  Have not added in past b/c blood pressures have always been 100-110's systolic.   His updated medication list for this problem includes:    Coumadin 5 Mg Tabs (Warfarin sodium) .Marland Kitchen... As directed    Baby Aspirin 81 Mg Chew (Aspirin) ..... One by mouth once daily    Warfarin Sodium 7.5 Mg Tabs (Warfarin sodium) .Marland Kitchen... Take as directed    Furosemide 40 Mg Tabs (Furosemide) .Marland Kitchen... Take 1 pill in am    Metoprolol Tartrate 25 Mg Tabs (Metoprolol tartrate) .Marland Kitchen... 1 tab by mouth daily for high blood pressure    Lisinopril 10 Mg Tabs (Lisinopril) .Marland Kitchen... Take 1 pill daily for high blood pressure  Orders: Cardiology Referral (Cardiology)  Problem # 3:  SHOULDER PAIN,  RIGHT, CHRONIC (ICD-719.41) Assessment: Deteriorated Precepted this patient with Sports Med preceptor who happened to be in clinic today and who examined patient much more thoroughly.  Concern for both osteoarthritis versus potential frozen shoulder in diabetic.  Will obtain complete shoulder x-rays.  Refer to orthopedics versus sports medicine depending on results, may need US guided steroid injection.  Will follow.   His updated medication list for this problem includes:    Baby Aspirin 81 Mg Chew (Aspirin) ..... One by mouth once daily    Hydrocodone-acetaminophen 5-500 Mg Tabs (Hydrocodone-acetaminophen) .Marland Kitchen... Take 1 pill every 6 hours if needed for pain  Orders: Radiology other (Radiology Other) [facility]- Est  Level 4 (60454)  Complete Medication List: 1)  Coumadin 5 Mg Tabs (Warfarin sodium) .... As directed 2)  Baby Aspirin 81 Mg Chew (Aspirin) .Marland KitchenMarland KitchenMarland Kitchen  One by mouth once daily 3)  Nitroquick 0.4 Mg Subl (Nitroglycerin) .... As needed 4)  Klor-con M20 20 Meq Tbcr (Potassium chloride crys cr) .Marland Kitchen.. 1 tablet two times a day by mouth 5)  Warfarin Sodium 7.5 Mg Tabs (Warfarin sodium) .... Take as directed 6)  Furosemide 40 Mg Tabs (Furosemide) .... Take 1 pill in am 7)  Simvastatin 80 Mg Tabs (Simvastatin) .Marland Kitchen.. 1 at bedtime by mouth 8)  Humulin N 100 Unit/ml Susp (Insulin isophane human) .... 65 units subcutaneously two times a day 9)  Onetouch Ultra Test Strp (Glucose blood) .... Check daily as directed daily icd9 code250.00 10)  Onetouch Lancets Misc (Lancets) .... Check daily as directed  icd9 code 250.00 11)  Metoprolol Tartrate 25 Mg Tabs (Metoprolol tartrate) .Marland Kitchen.. 1 tab by mouth daily for high blood pressure 12)  Hydrocodone-acetaminophen 5-500 Mg Tabs (Hydrocodone-acetaminophen) .... Take 1 pill every 6 hours if needed for pain 13)  Lisinopril 10 Mg Tabs (Lisinopril) .... Take 1 pill daily for high blood pressure  Other Orders: 12 Lead EKG (12 Lead EKG) A1C-[facility] (13244)  Patient  Instructions: 1)  Take 1 Lasix daily.   2)  We will start you on Lisinopril 10 mg daily to help to protect your kidney from diabetes and for your blood pressure. 3)  We will get your set up with a heart doctor. 4)  For your shoulder pain, we will send you to get x-rays.  We will then send you to Sports Medicine for an injection.  5)  Come back and see me in 1-2 weeks for your dizziness.   Prescriptions: LISINOPRIL 10 MG TABS (LISINOPRIL) Take 1 pill daily for high blood pressure  #31 x 1   Entered and Authorized by:   Renold Don MD   Signed by:   Renold Don MD on 11/19/2009   Method used:   Electronically to        [facility] [address] [id number]* (retail)       [address] [place], [place]  [address]       Ph: [phone]       Fax: [phone]   RxID:   [id number]   Laboratory Results   Blood Tests   Date/Time Received: November 19, 2009 11:47 AM  Date/Time Reported: November 19, 2009 11:58 AM   HGBA1C: 7.8%   (Normal Range: Non-Diabetic - 3-6%   Control Diabetic - 6-8%)  Comments: ...........test performed by...........Marland KitchenTerese Door, CMA

## 2010-04-23 NOTE — Assessment & Plan Note (Signed)
Summary: PER CHECK OUT/SF   Visit Type:  Follow-up Primary Provider:  Renold Don MD  CC:  Endocarditis.  History of Present Illness: The patient presents for followup after hospitalization she was found to have sternal wound infection, MRSA bacteremia and possible endocarditis. Transesophageal echo demonstrated tricuspid regurgitation. He is currently being treated right infectious disease with a prolonged course of outpatient antibiotics. Since discharge from the hospital he has no new complaints. His biggest complaint continues to be right shoulder discomfort. Is not reporting fevers or chills. He has chronic dyspnea but no new PND or orthopnea. His sternum has been sore but he's had no new anginal symptoms. He said no palpitations, presyncope or syncope.  Current Medications (verified): 1)  Baby Aspirin 81 Mg  Chew (Aspirin) .... One By Mouth Once Daily 2)  Nitroquick 0.4 Mg  Subl (Nitroglycerin) .... As Needed 3)  Klor-Con M20 20 Meq  Tbcr (Potassium Chloride Crys Cr) .Marland Kitchen.. 1 Tablet Two Times A Day By Mouth 4)  Warfarin Sodium 7.5 Mg Tabs (Warfarin Sodium) .... Take As Directed 5)  Furosemide 40 Mg Tabs (Furosemide) .... Take 1 Pill in Am 6)  Simvastatin 80 Mg Tabs (Simvastatin) .Marland Kitchen.. 1 At Bedtime By Mouth 7)  Humulin N 100 Unit/ml Susp (Insulin Isophane Human) .... 65 Units Subcutaneously Two Times A Day 8)  Onetouch Ultra Test  Strp (Glucose Blood) .... Check Daily As Directed Daily Icd9 Code250.00 9)  Onetouch Lancets  Misc (Lancets) .... Check Daily As Directed  Icd9 Code 250.00 10)  Metoprolol Tartrate 25 Mg Tabs (Metoprolol Tartrate) .... Out 11)  Lisinopril 10 Mg Tabs (Lisinopril) .... Take 1 Pill Daily For High Blood Pressure 12)  Alprazolam 0.5 Mg Tabs (Alprazolam) .... Take 1 Tab Q 12 Hours As Needed Pain 13)  Wheelchair  Misc (Misc. Devices) .... Powerized Wheelchair 14)  Acetaminophen-Codeine #3 300-30 Mg Tabs (Acetaminophen-Codeine) .... Take 1 Tab Q 8 Hours As Needed  Pain  Allergies (verified): 1)  * Valium  Past History:  Past Medical History:  1. CAD, status post CABG in 1992.       a.     Stent to the circumflex vein graft in 1999.       b.     PTCA x2 to the SVG to the RCA, February 2002.       c.     Cath in 2006 showing an occluded vein graft to the OM,        occluded LIMA to the LAD, occluded vein graft to the RCA, and        severe two-vessel disease to the left main and moderate LAD.   2. 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   3. Obstructive sleep apnea, on CPAP.   4. COPD and history of tobacco abuse.   5. Hypertension.   6. Depression.   7. Insulin-dependent diabetes.   8. Diverticulosis.   9. PE/DVT, on chronic Coumadin, the patient states this occurred 10-15       years ago and he has been maintained on Coumadin ever since.   10.Acid reflux.   11.Hyperlipidemia.   12.Ischemic cardiomyopathy with an EF of 35% in 2007 with abnormal  diastolic relaxation abnormality, but most recent echo 50-55% on    December 11, 2009.   13. 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)  Past Surgical History: Barium swallow - 8/03  Esophageal spasm/reflux - 12/08/2001,  Cardiolite - EF 25%, marked hypokinesia - 03/15/2003,  Colonoscopy - 8/03 -  hyperplastic polyps   Echo 05/03 - EF 40-50% -,  peak flows 450-550 - 12/22/2000 Hip surg L 1983 1984 Rotator Cuff Repair  L endarterectomy Carotid 1995 Cerv Fusion Vertebroplasty C3/4/5 - 2002 R Elbow Surg x 2  R Carpal Tunnel Release Colonoscopy polyps multip B9 05/08/2004 Venous U/S LE neg DVT 10/07/2004 [facility] CP MI Cath EF35-40% grafts occluded  inf hypokin tx'd med 12/25-12/27/2006 ECHO  decr LVF 05/28/2005 ECHO  mod LVE mild MR Tr TR EF 55%  03/04/2006 [facility] LE isch/pain PVD L Pop Stenosis CHF DM Sleep Apnea Chronic RF  COPD  3/28-06/28/2006 L Fem Bypass Revision Thrombectomy Aneurysm Resction  06/22/2006 Debridement of sternal wound incision with wire extraction  Review of Systems       As stated in the HPI and negative for all other systems.   Vital Signs:  Patient profile:   70 year old male Height:      67 inches Weight:      273 pounds BMI:     42.91 Pulse rate:   95 / minute Resp:     18 per minute BP sitting:   122 / 68  (right arm)  Vitals Entered By: Marrion Coy, CNA (January 11, 2010 8:42 AM)  Physical Exam  General:  Unkept, angry Head:  normocephalic and atraumatic Neck:  supple, no masses, no thyromegaly, no JVD, and decreased ROM.   Chest Wall:  Bandage  in place over previous I and D of sternoclavicular joint.  No drainage Lungs:  Decreased breath sounds but no wheezing or crackles Abdomen:  Bowel sounds positive; abdomen soft and non-tender without masses, organomegaly, or hernias noted. No hepatosplenomegaly, obese Extremities:  No clubbing, cyanosis BL LE's, chronic venous stasis changes Neurologic:  Alert and oriented x 3. Skin:  Intact without lesions or rashes. Cervical Nodes:  no significant adenopathy Inguinal Nodes:  no significant adenopathy Psych:  Normal affect.   Detailed Cardiovascular Exam  Neck    Carotids: Carotids full and equal bilaterally without bruits.      Neck Veins: Normal, no JVD.    Heart    Inspection: no deformities or lifts noted.      Palpation: normal PMI with no thrills palpable.      Auscultation: regular rate and rhythm, S1, S2 without murmurs, rubs, gallops, or clicks.    Vascular    Femoral Pulses: normal femoral pulses bilaterally.      Pedal Pulses: Dminished pulses bilateral LE's     Radial Pulses: normal radial pulses bilaterally.      Peripheral Circulation: no clubbing, cyanosis, with normal capillary refill.     Impression &  Recommendations:  Problem # 1:  METHICILLIN SUSCEPTIBLE STAPH AUREUS SEPTICEMIA (ICD-038.11) The patient has possible endocarditis. However, the treatment needs to be the antibiotics as prescribed. If he has persistent bacteremia we will need to reimage with echocardiography. I will defer followup to his infectious disease specialist.  Problem # 2:  CORONARY, ARTERIOSCLEROSIS (ICD-414.00) He is having no active angina. No change in therapy is indicated. Orders: EKG w/ Interpretation (93000)  Problem # 3:  CHF - EJECTION FRACTION < 50% (ICD-428.22) He seems to be euvolemic.  He will continue on meds as listed.  Problem # 4:  Social Assessment: Deteriorated The patient was stat and angry about a minimal wait for a this visit. We discussed this behavior and anger toward my staff would not be acceptable.  Patient Instructions: 1)  Your physician recommends that you schedule a follow-up appointment in: 6 months with Dr Antoine Poche  2)  Your physician recommends that you continue on your current medications as directed. Please refer to the Current Medication list given to you today.
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
## 2010-04-23 NOTE — Miscellaneous (Signed)
Summary: refill alprazolam?  Clinical Lists Changes rec'd a faxed request for alprazolam. plz advise if ok to fill.[name] RN  June 22, 2009 10:05 AM  Yes I have reviewed this patient's chart.  Please refill.
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
## 2010-04-23 NOTE — Miscellaneous (Signed)
Summary: Consent: I&D abcess  Consent: I&D abcess   Imported By: Knox Royalty 12/11/2009 13:15:30  _____________________________________________________________________  External Attachment:    Type:   Image     Comment:   External Document

## 2010-04-23 NOTE — Miscellaneous (Signed)
Summary: ROI  ROI   Imported By: Clydell Hakim 05/01/2009 15:52:00  _____________________________________________________________________  External Attachment:    Type:   Image     Comment:   External Document

## 2010-04-23 NOTE — Progress Notes (Signed)
Summary: Genevieve Norlander calling for order   Phone Note From Other Clinic   Caller: Genevieve Norlander - [place] Cutter [phone] Summary of Call: Nurse is not able to get blood return from PICC, she was not able to get blood form peripheral stick.  Requesting order for nurse to come on Tuesday to draw labs.   Verbal order called to [facility], also the patient will need a OV with Dr Algis Liming, there is nothing scheduled.  Tomasita Morrow RN  January 07, 2010 9:55 AM
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
## 2010-04-23 NOTE — Progress Notes (Signed)
Summary: phn msg  Phone Note From Other Clinic Call back at [phone] [phone]   Caller: Dereck Leep Summary of Call: is asking about forms that were to be faxed after OV on 5/12 Initial call taken by: De Nurse,  Aug 08, 2009 1:50 PM  Follow-up for Phone Call        they were for DM supplies. they had ben placed in pcp chart box for this appt. message to pcp Follow-up by: [name] RN,  Aug 08, 2009 1:57 PM  Additional Follow-up for Phone Call Additional follow up Details #1::        I have received several sets of forms from Mrs. Emily.  I believe they have all been completed and returrned to be faxed.  Will check and make sure Additional Follow-up by: Renold Don MD,  Aug 08, 2009 3:15 PM

## 2010-04-23 NOTE — Miscellaneous (Signed)
Summary: [facility]  [facility]   Imported By: Florinda Marker 02/21/2010 09:43:06  _____________________________________________________________________  External Attachment:    Type:   Image     Comment:   External Document

## 2010-04-23 NOTE — Progress Notes (Signed)
  Phone Note Outgoing Call   Call placed by: Jimmy Footman, CMA,  February 07, 2010 11:39 AM Call placed to: [facility] Summary of Call: Spoke with [facility] @ specialty pharmacy with [facility] and informed her that pt's PIC line was removed today in [facility] office and that oral antibiotics(doxy) was started today by dr. Daiva Eves Initial call taken by: Jimmy Footman, CMA,  February 07, 2010 11:41 AM
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
## 2010-04-23 NOTE — Assessment & Plan Note (Signed)
Summary: chronic pain,df   Vital Signs:  Patient profile:   70 year old male Weight:      289.2 pounds Temp:     97.5 degrees F oral Pulse rate:   89 / minute Pulse rhythm:   regular BP sitting:   116 / 69  (left arm) Cuff size:   large  Vitals Entered By: [name] CMA (July 06, 2009 3:37 PM)  Primary Care Provider:  Renold Don MD   History of Present Illness: 70 yo seen for work in for "pain all over"  Pain had been chronic for years.  Per chart review it looks as he recently changed from [facility] PCP to [facility].  Had been on Tylenol with codeine for chronic pain management.  He says the reason he did not address this with his PCP is that he is not due back to see him until next month.  Patient feels like he needs pain medications daily.  Pain has not changed in intensity or character.  Has chronic pain from old left ankle injury, left knee surgery, low back pain, pain from left leg venous stasis. Occaisional low back pain radiates down legs.    Denies increasing intensity of pain, weakness, numbness, loss of bowel or bladder, weight loss, night sweats.  No new meds.  No myalgias.  Allergies: 1)  Valium (Diazepam) 2)  Valium (Diazepam) 3)  Valium (Diazepam) 4)  * Valium  Review of Systems      See HPI  Physical Exam  General:  Vital signs reviewed, morbidly obese. Well-developed, well-nourished patient in NAD.  Awake, cooperative.  Lungs:  Normal respiratory effort, chest expands symmetrically. Lungs are clear to auscultation, no crackles or wheezes. Heart:  Normal rate and regular rhythm. S1 and S2 normal without gallop, murmur, click, rub or other extra sounds. Msk:  well healed medial scar along left knee. left ankle tender to the touch.  no acute redness or inflamamtion. left leg chronic venous changes with no weeping. left great toe with healing ulcer- no drainage, no depth.   Impression & Recommendations:  Problem # 1:  CHRONIC PAIN SYNDROME  (ICD-338.4)  Has been dealing with multifactorial chronic pain for many years.  has had many orthopedic issues such as cervical neck fusion, lumbar back pain, knee surgery, ankle surgery.  Also has pain due to chronic venous stasis.    Today pain is unchanged-mostly knee, ankle and low back.  No complaining of neck or shoudler pain.  No evidence for red flags for back pain.  Knee and ankle pain chronic, no evidence of gout or infection with no effusion, redness.  Great toe ulcer healing.  Would further explore if component of peripheral neuropathy but at this time only one leg is painful.  If pain worsens, would consider re-imaging as I do not see any recent films in Echart or Centricty.  Will start pain regimen of Tylenol and Tramadol as needed.  Discussed importance of weight management, physical activitywith patient.  Set expectations for goals of pain management and not total relief of pain.  At this time he is not enthusiastic about physical therapy but does plan on getting a [employer]  Patient to return to discuss how this worked with PCP before more refills given.    Orders: [facility]- Est Level  3 (91478)  Problem # 2:  COUMADIN TX DUE TO CORONARY AND PVD (ICD-V58.61)  Unclear why life long coumadin indicated but on E-Chart review certainly hospital discharge with history of  recurrent DVT's in one indication.  Has not had INR check recently.  Will check today.  Orders: [facility]- Est Level  3 (99213) INR/PT-[facility] (10272)  Problem # 3:  Preventive Health Care (ICD-V70.0) Stressed to patient importance of follow-up and communication with PCP.  Patient has been a patient at the practice many year ago, most recently was seen at [facility].  Left due to being tol he was too complicated of a patient and due to insurance no longer being covered there.  Follow-up in 1 month with PCP.  Problem # 4:  WOUND, OPEN, TOE(S), WITHOUT COMPLICATION (ICD-893.0) healing.  I'm not sure patient ever  followed up with referral.  Ok today.  Will follow-up with PCP.  Complete Medication List: 1)  Duoneb 2.5-0.5 Mg/52ml Soln (Albuterol-ipratropium) .... Four times a day as needed 2)  Coumadin 5 Mg Tabs (Warfarin sodium) .... As directed 3)  Imdur 30 Mg Tb24 (Isosorbide mononitrate) .... One by mouth once daily 4)  Baby Aspirin 81 Mg Chew (Aspirin) .... One by mouth once daily 5)  Metoprolol Tartrate 25 Mg Tabs (Metoprolol tartrate) .... 1/2 tablet by mouth once  a day 6)  Gas-x Maximum Strength 166 Mg Caps (Simethicone) .... One by mouth three times a day 7)  Nitroquick 0.4 Mg Subl (Nitroglycerin) .... As needed 8)  Klor-con M20 20 Meq Tbcr (Potassium chloride crys cr) .Marland Kitchen.. 1 tablet three times a day by mouth 9)  Warfarin Sodium 7.5 Mg Tabs (Warfarin sodium) .... Take as directed 10)  Flexeril 10 Mg Tabs (Cyclobenzaprine hcl) .... One tab by mouth three times a day as needed 11)  Torsemide 20 Mg Tabs (Torsemide) .... 60 mg twice a day by mouth 12)  Simvastatin 80 Mg Tabs (Simvastatin) .Marland Kitchen.. 1 at bedtime by mouth 13)  Humulin N 100 Unit/ml Susp (Insulin isophane human) .... 65 units subcutaneously two times a day 14)  Tramadol Hcl 50 Mg Tabs (Tramadol hcl) .... Take one tablet every 6 hours as needed for pain 15)  Onetouch Ultra Test Strp (Glucose blood) .... Check daily as directed daily icd9 code250.00 16)  Onetouch Lancets Misc (Lancets) .... Check daily as directed  icd9 code 250.00 17)  Bupropion Hcl 75 Mg Tabs (Bupropion hcl) .... Take 1 tab daily for 5 days, then start taking 1 tab in am and 1 tab in pm 18)  Alprazolam 0.5 Mg Tabs (Alprazolam) .... Take 1 tab daily if needed for anxiety. 19)  Tylenol Extra Strength 500 Mg Tabs (Acetaminophen) .... Take one tablet every 6 hours as needed for pain  Patient Instructions: 1)  Weight loss and staying active are going to be key in managing your chronic pain! 2)  Increase activity slowly- Great idea about going to the [facility]! 3)  If you notice  any weakness, loss of bowel or bladder function, night sweats, numbness, please make appt to be seen. 4)  It will be important to see your primary doctor to discuss continuing pain medications. 5)  Make appt to see PCP. Prescriptions: TYLENOL EXTRA STRENGTH 500 MG TABS (ACETAMINOPHEN) take one tablet every 6 hours as needed for pain  #120 x 0   Entered and Authorized by:   Delbert Harness MD   Signed by:   Delbert Harness MD on 07/06/2009   Method used:   Print then Give to Patient   RxID:   [id number] TRAMADOL HCL 50 MG TABS (TRAMADOL HCL) take one tablet every 6 hours as needed for pain  #120 x 0  Entered and Authorized by:   Delbert Harness MD   Signed by:   Delbert Harness MD on 07/06/2009   Method used:   Print then Give to Patient   RxID:   [id number]   Appended Document: INR  2.2    Lab Visit  Laboratory Results   Blood Tests      INR: 2.2   (Normal Range: 0.88-1.12   Therap INR: 2.0-3.5)   Orders Today:    ANTICOAGULATION RECORD PREVIOUS REGIMEN & LAB RESULTS Anticoagulation Diagnosis:  414.00 on  10/28/2007 Previous INR Goal Range:  2.0-3.5 on  10/28/2007 Previous INR:  1.7 on  10/04/2008 Previous Coumadin Dose(mg):  7.5mg  qd,5mg  Tu,Thur on  10/28/2007 Previous Regimen:  5mg  qd, 7.5mg  this Thur on  10/04/2008 Previous Coagulation Comments:  Patient had INR checked here while he was seeing Dr today on  10/04/2008  NEW REGIMEN & LAB RESULTS Anticoag. Dx: recurrent DVT's;   PVD,  Coronary Current INR: 2.2 Current Coumadin Dose(mg): 5 mg tablets Regimen: continue:  7.5 mg - Thurs;   5 mg - other days Coagulation Comments: pt changed his dose himself due to fact his pills were running low;  last INR was in Feb according to pt Provider: Gwendolyn Grant Repeat testing in: 1 month with ov Other Comments: ...............test performed by......Marland KitchenBonnie A. [place], MLS (ASCP)cm   Dose has been reviewed with patient or caretaker during this visit. Reviewed by: Mosie Lukes  (ASCP)cm  Anticoagulation Visit Questionnaire Coumadin dose missed/changed:  Yes Coumadin Dose Comments:  pt adjusted pills himself  Abnormal Bleeding Symptoms:  Yes    Bruising or bleeding from nose or gums, in urine or stool since the last visit:  bruising due to hitting arm and scratching arm Any diet changes including alcohol intake, vegetables or greens since the last visit:  No Any illnesses or hospitalizations since the last visit:  No Any signs of clotting since the last visit (including chest discomfort, dizziness, shortness of breath, arm tingling, slurred speech, swelling or redness in leg):  Yes      Signs of Clotting:  chest discomfort, dizziness, shortness of breath, arm & hand tingling, slurred speech occasionally, and swelling in leg  MEDICATIONS DUONEB 2.5-0.5 MG/3ML  SOLN (ALBUTEROL-IPRATROPIUM) Four times a day as needed COUMADIN 5 MG TABS (WARFARIN SODIUM) as directed IMDUR 30 MG  TB24 (ISOSORBIDE MONONITRATE) one by mouth once daily BABY ASPIRIN 81 MG  CHEW (ASPIRIN) one by mouth once daily METOPROLOL TARTRATE 25 MG  TABS (METOPROLOL TARTRATE) 1/2 tablet by mouth once  a day GAS-X MAXIMUM STRENGTH 166 MG  CAPS (SIMETHICONE) one by mouth three times a day NITROQUICK 0.4 MG  SUBL (NITROGLYCERIN) as needed KLOR-CON M20 20 MEQ  TBCR (POTASSIUM CHLORIDE CRYS CR) 1 TABLET three times a day BY MOUTH WARFARIN SODIUM 7.5 MG TABS (WARFARIN SODIUM) take as directed FLEXERIL 10 MG TABS (CYCLOBENZAPRINE HCL) one tab by mouth three times a day as needed TORSEMIDE 20 MG TABS (TORSEMIDE) 60 MG TWICE A DAY BY MOUTH SIMVASTATIN 80 MG TABS (SIMVASTATIN) 1 AT BEDTIME BY MOUTH HUMULIN N 100 UNIT/ML SUSP (INSULIN ISOPHANE HUMAN) 65 units Subcutaneously two times a day TRAMADOL HCL 50 MG TABS (TRAMADOL HCL) take one tablet every 6 hours as needed for pain ONETOUCH ULTRA TEST  STRP (GLUCOSE BLOOD) check daily as directed daily icd9 code250.00 ONETOUCH LANCETS  MISC (LANCETS) check daily as  directed  icd9 code 250.00 BUPROPION HCL 75 MG TABS (BUPROPION HCL) Take 1 tab daily for 5 days,  then start taking 1 tab in AM and 1 tab in PM ALPRAZOLAM 0.5 MG TABS (ALPRAZOLAM) Take 1 tab daily if needed for anxiety. TYLENOL EXTRA STRENGTH 500 MG TABS (ACETAMINOPHEN) take one tablet every 6 hours as needed for pain

## 2010-04-23 NOTE — Progress Notes (Addendum)
Summary: Care Plan [facility]  Phone Note Outgoing Call   Call placed by: Acey Lav MD,  February 06, 2010 11:38 AM Details for Reason: Care Plan oversight Summary of Call: [phone] (30 or more mins)  I have supervised home care and/or infusion therapy for this pt, including providing orders for care, review of labs and/or home health care plans, communicating with the home health care professionals and/or patient/caregivers to integrate current information into the medical treatment plan and/or adjust the medical therapy. This supervision has been provided for 32__minutes during the calendar month. Dates for this oversight _November 2nd thru December 2nd. treating staphylococcus aureus bacteremia and sternoclavicular septic arthriits   Initial call taken by: Acey Lav MD,  February 06, 2010 11:39 AM

## 2010-04-23 NOTE — Miscellaneous (Signed)
Summary: [facility]: Physician Plan  [facility]: Physician Plan   Imported By: Florinda Marker 01/25/2010 15:09:51  _____________________________________________________________________  External Attachment:    Type:   Image     Comment:   External Document

## 2010-04-23 NOTE — Miscellaneous (Signed)
  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

## 2010-04-23 NOTE — Miscellaneous (Signed)
Summary: Appointment No Show  Appointment status changed to no show by [employer] on 12/18/2009 9:36 AM.  No Show Comments ---------------- ECHO/428.22/[facility]/NO PREC. REQ/SAF  Appointment Information ----------------------- Appt Type:  CARDIOLOGY ANCILLARY VISIT      Date:  Tuesday, December 18, 2009      Time:  8:30 AM for 60 min   Urgency:  Routine   Made By:  Pearson Grippe  To Visit:  [id number]    Reason:  ECHO/428.22/[facility]/NO PREC. REQ/SAF  Appt Comments ------------- -- 12/18/09 9:36: ([facility]) NO SHOW -- ECHO/428.22/[facility]/NO PREC. REQ/SAF -- 11/29/09 11:38: ([facility]) BOOKED -- Routine CARDIOLOGY ANCILLARY VISIT at 12/18/2009 8:30 AM for 60 min ECHO/428.22/[facility]/NO PREC. REQ/SAF  Appended Document: Appointment No Show Patient is currently inpatient at [facility] and had Echo performed there.  This is reason he was unable to come to Cardiology appointment.

## 2010-04-23 NOTE — Miscellaneous (Signed)
Summary: medical supplies form  Clinical Lists Changes Recieved diabetic form via fax, Call pt and  left voice message for pt to sched an apt with PCP to have diabetic supply form completed and labs drawn.  Form returned to PCP's box.[name]  July 12, 2009 3:14 PM last OV 4/15. to pcp to see if she can complete since last visit was very recent. lm for him to call me back needs A1C. has been seeing a Dr. Hetty Ely (endocrinologist?) perhaps form should be done by them. I need to know how often he tests.[name] RN  July 16, 2009 9:32 AM

## 2010-04-23 NOTE — Miscellaneous (Signed)
Summary: re : meds from [facility]  Clinical Lists Changes   Lenetta Quaker - Jones calls from  [place] . she has been trying to get meds for patient at a cheaper copay and needs MD to sign forms that will be faxed to [place] from [facility]. apparently she has tried to get this before but they still  has not received. Needs rx signed for Coumadin 5 mg , tramadol 50 mg, simvastatin 80 mg, metoprolol tartrate , potassium 20 meq, furosemide 40 mg.,  acetaminophen with codiene  # 3, and alprazolam 0.5 mg. also patient takes coumadin 7.5 mg on thursday and he wants a Rx for the 7.5 mg , he does not want to cut a 5 mg tab . [facility] should be faxing this today . will be on the look out for and will place in MD box when it is received. Theresia Lo RN  August 28, 2009 3:25 PM    phone # Angelique Blonder [phone] ext [phone], [facility] [phone] fax [phone]. Theresia Lo RN  August 28, 2009 3:26 PM  Prescriptions: WARFARIN SODIUM 7.5 MG TABS (WARFARIN SODIUM) take as directed  #100.0 Each x 4   Entered and Authorized by:   Renold Don MD   Signed by:   Renold Don MD on 09/03/2009   Method used:   Print then Give to Patient   RxID:   [id number]  form received and placed in MD box. Theresia Lo RN  August 29, 2009 10:21 AM  completed and given to [place].  Renold Don MD  September 03, 2009 2:02 PM   Appended Document: re : meds from [facility] faxed to [facility].

## 2010-04-23 NOTE — Assessment & Plan Note (Signed)
Summary: F/U PER [facility]   Vital Signs:  Patient profile:   70 year old male Weight:      277.5 pounds Temp:     97.6 degrees F oral Pulse rate:   64 / minute Pulse rhythm:   regular BP sitting:   108 / 67  (left arm) Cuff size:   large  Vitals Entered By: [name] CMA (September 20, 2009 11:42 AM) CC: follow-up visit, lesion on left hip   Primary Care Provider:  Renold Don MD  CC:  follow-up visit and lesion on left hip.  History of Present Illness: CC: L hip lesion f/u  1)  lesion on Left hip:  had I&D done 3 days ago. cx grew out MRSA. pt taking doxycycline. on day #4. Pt noticed red area about 1 week ago after getting out of shower.  1 cm in size.  no fevers, chills, N/V. patient thinks area improving.   Current Medications (verified): 1)  Coumadin 5 Mg Tabs (Warfarin Sodium) .... As Directed 2)  Baby Aspirin 81 Mg  Chew (Aspirin) .... One By Mouth Once Daily 3)  Gas-X Maximum Strength 166 Mg  Caps (Simethicone) .... One By Mouth Three Times A Day 4)  Nitroquick 0.4 Mg  Subl (Nitroglycerin) .... As Needed 5)  Klor-Con M20 20 Meq  Tbcr (Potassium Chloride Crys Cr) .Marland Kitchen.. 1 Tablet Two Times A Day By Mouth 6)  Warfarin Sodium 7.5 Mg Tabs (Warfarin Sodium) .... Take As Directed 7)  Furosemide 40 Mg Tabs (Furosemide) .... Take 1 Pill in Am and 1 Pill in Pm 8)  Simvastatin 80 Mg Tabs (Simvastatin) .Marland Kitchen.. 1 At Bedtime By Mouth 9)  Humulin N 100 Unit/ml Susp (Insulin Isophane Human) .... 65 Units Subcutaneously Two Times A Day 10)  Tramadol Hcl 50 Mg Tabs (Tramadol Hcl) .... Take One Tablet Every 6 Hours As Needed For Pain 11)  Onetouch Ultra Test  Strp (Glucose Blood) .... Check Daily As Directed Daily Icd9 Code250.00 12)  Onetouch Lancets  Misc (Lancets) .... Check Daily As Directed  Icd9 Code 250.00 13)  Alprazolam 0.5 Mg Tabs (Alprazolam) .... Take 1 Tab Daily If Needed For Anxiety. 14)  Tylenol Extra Strength 500 Mg Tabs (Acetaminophen) .... Take One Tablet Every 6 Hours As  Needed For Pain 15)  Metoprolol Tartrate 25 Mg Tabs (Metoprolol Tartrate) .Marland Kitchen.. 1 Tab By Mouth Daily For High Blood Pressure 16)  Paroxetine Hcl 20 Mg Tabs (Paroxetine Hcl) .... Take 1 Pill Daily To Help With Anxiety 17)  Doxycycline Hyclate 100 Mg Caps (Doxycycline Hyclate) .... Take 1 Pill By Mouth Two Times A Day For Next 7 Days  Allergies (verified): 1)  * Valium  Physical Exam  General:  Vital signs reviewed Well-developed, obese patient in NAD.  Awake, cooperative.  Skin:  3x3 cm erythematous and indurated area at Left lateral aspect of upper thigh with open wound which was I&D site. some purulence noted in wound. minimal tenderness. no fluctuance.    Impression & Recommendations:  Problem # 1:  CELLULITIS (ICD-682.9) Assessment Improved  cx with MRSA sensitive to tetracyclines. continue doxycycline. f/u in 1 week to determine if additional antibiotics needed.   His updated medication list for this problem includes:    Doxycycline Hyclate 100 Mg Caps (Doxycycline hyclate) .Marland Kitchen... Take 1 pill by mouth two times a day for next 7 days  Orders: [facility]- Est Level  3 ([address])  Patient Instructions: 1)  follow up in one week with Dr. Gwendolyn Grant 2)  Please schedule a follow-up appointment in 1 month.

## 2010-04-23 NOTE — Miscellaneous (Signed)
  Clinical Lists Changes Update to clean up medication list.  Received info from [facility] recommending patient be on ACE-I for renal protection.  However patient's blood pressures last several visits have been borderline low, therefore plan to hold off on ACE at this time.    Medications: Removed medication of GAS-X MAXIMUM STRENGTH 166 MG  CAPS (SIMETHICONE) one by mouth three times a day Removed medication of TRAMADOL HCL 50 MG TABS (TRAMADOL HCL) take one tablet every 6 hours as needed for pain Removed medication of TYLENOL EXTRA STRENGTH 500 MG TABS (ACETAMINOPHEN) take one tablet every 6 hours as needed for pain Removed medication of DOXYCYCLINE HYCLATE 100 MG CAPS (DOXYCYCLINE HYCLATE) Take 1 pill by mouth two times a day for next 7 days

## 2010-04-23 NOTE — Miscellaneous (Signed)
Summary: [facility]: Orders  [facility]: Orders   Imported By: Florinda Marker 01/25/2010 15:11:02  _____________________________________________________________________  External Attachment:    Type:   Image     Comment:   External Document
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
## 2010-04-23 NOTE — Miscellaneous (Signed)
Summary: fell 3 times this am  Clinical Lists Changes wife states he has fallen 3 times so far today . back & legs hurt. denies hitting head. very dizzy. am cbg was 197. has eaten breakfast & is drinking fluids. states he des fall but not usually 3 in a day. does not want to go to ED. states he will be fine until 1:30 appt. asked her to bring all med bottles & meter with them. if he changes how he is now or falls again, call 911 & go to ED. she agreed.Marland KitchenMarland KitchenGolden Circle RN  November 02, 2009 11:48 AM

## 2010-04-23 NOTE — Progress Notes (Signed)
Summary: phone note  Phone Note Outgoing Call   Call placed by: [name] CMA,  November 19, 2009 2:14 PM Call placed to: Patient Summary of Call: pt has appt with Selena Batten 09.27.2011 @ 1045am  Follow-up for Phone Call        pt informed and agreed to appt Follow-up by: [name] CMA,  November 19, 2009 3:34 PM

## 2010-04-23 NOTE — Progress Notes (Signed)
Summary: nurse from [facility]  Phone Note Other Incoming   Caller: Belenda Cruise with [facility] Summary of Call: Nurse with [facility] called, was suppose to draw labs on pt. this AM, but pt. had already started Vancomycin, so Nurse will do Vanc. troph on Wednesday morning 01/30/10 Initial call taken by: Wendall Mola CMA Duncan Dull),  January 28, 2010 10:57 AM  Follow-up for Phone Call        HE NEEDS A FOLLOWUP APPT WITH ME!!  Follow-up by: Acey Lav MD,  January 28, 2010 9:44 PM
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
## 2010-04-23 NOTE — Progress Notes (Signed)
Summary: PICC running slowly, order Cath Flo?  Phone Note Other Incoming   Caller: Belenda Cruise, Warden/ranger Summary of Call: Obtained labs today rather than Monday due to the pt. moving to a new location.  Vanco is running very slowly, taking 3 hours rather than 2 for administration.  Do you want to order Cath Flo (activase) to be administered?  This order will need to be written and faxed to [facility].  The pt. has a 9 AM appt. with Dr. Daiva Eves, Thurs., Nov. 17th.  Please adivse to [place].  Jennet Maduro RN  February 06, 2010 9:17 AM   Follow-up for Phone Call        Lets assess him tomorrow at his clinic visit Follow-up by: Acey Lav MD,  February 06, 2010 11:03 AM

## 2010-04-23 NOTE — Assessment & Plan Note (Signed)
Summary: mutiple falls this am/[place]   Vital Signs:  Patient profile:   70 year old male Weight:      286 pounds O2 Sat:      93 % on Room air Pulse (ortho):   80 / minute BP sitting:   128 / 72  (left arm) BP standing:   128 / 72 Cuff size:   large  Vitals Entered By: Arlyss Repress CMA, (November 02, 2009 1:51 PM)  O2 Flow:  Room air  Serial Vital Signs/Assessments:  Time      Position  BP       Pulse  Resp  Temp     By 1:59 PM   Lying LA  128/78   80                    Thekla Slade CMA, 1:59 PM   Sitting   130/72   80                    Thekla Slade CMA, 1:59 PM   Standing  128/72   80                    Thekla Slade CMA,  CC: see note. pt feels dizzy. Is Patient Diabetic? Yes Pain Assessment Patient in pain? no        Primary Care Provider:  Renold Don MD  CC:  see note. pt feels dizzy.Marland Kitchen  History of Present Illness: Dizziness: Pt has been feeling dizzy x 2 days. She has a dry mouth and feels dizzy when going from sitting to standing. He does not have any ringing in his ears, says his legs feel strong and steady but his head feels dizzy. He says it is worse when he leans over to pick something up and then stands back up. He has falled 3 times today because of the dizziness. He feel on 7/26 as well. He has not felt any palpitations, at first he said he did not have any new meds, and then he told me he had recently started Tramadol. No vomiting.   Current Medications (verified): 1)  Coumadin 5 Mg Tabs (Warfarin Sodium) .... As Directed 2)  Baby Aspirin 81 Mg  Chew (Aspirin) .... One By Mouth Once Daily 3)  Nitroquick 0.4 Mg  Subl (Nitroglycerin) .... As Needed 4)  Klor-Con M20 20 Meq  Tbcr (Potassium Chloride Crys Cr) .Marland Kitchen.. 1 Tablet Two Times A Day By Mouth 5)  Warfarin Sodium 7.5 Mg Tabs (Warfarin Sodium) .... Take As Directed 6)  Furosemide 40 Mg Tabs (Furosemide) .... Take 1 Pill in Am and 1 Pill in Pm 7)  Simvastatin 80 Mg Tabs (Simvastatin) .Marland Kitchen.. 1 At Bedtime By  Mouth 8)  Humulin N 100 Unit/ml Susp (Insulin Isophane Human) .... 65 Units Subcutaneously Two Times A Day 9)  Onetouch Ultra Test  Strp (Glucose Blood) .... Check Daily As Directed Daily Icd9 Code250.00 10)  Onetouch Lancets  Misc (Lancets) .... Check Daily As Directed  Icd9 Code 250.00 11)  Metoprolol Tartrate 25 Mg Tabs (Metoprolol Tartrate) .Marland Kitchen.. 1 Tab By Mouth Daily For High Blood Pressure 12)  Hydrocodone-Acetaminophen 5-500 Mg Tabs (Hydrocodone-Acetaminophen) .... Take 1 Pill Every 6 Hours If Needed For Pain  Allergies (verified): 1)  * Valium  Review of Systems        vitals reviewed and pertinent negatives and positives seen in HPI   Physical Exam  General:  Well-developed,well-nourished,in no acute distress; alert,appropriate  and cooperative throughout examination Head:  Normocephalic and atraumatic without obvious abnormalities. No apparent alopecia or balding. Eyes:  No corneal or conjunctival inflammation noted. EOMI. Perrla. Funduscopic exam benign, without hemorrhages, exudates or papilledema. Vision grossly normal. Lungs:  Normal respiratory effort, chest expands symmetrically. Lungs are clear to auscultation, no crackles or wheezes. Heart:  Normal rate and regular rhythm. S1 and S2 normal without gallop, murmur, click, rub or other extra sounds.   Impression & Recommendations:  Problem # 1:  DIZZINESS (ICD-780.4) Assessment New Orthostatics neg, Pt recently started on Tramadol which has the first side effect as dizziness. Plan to stop Tramadol today and restart his Vicodin which he has used in the past with no dizziness. PT to RTC to see Dr. Gwendolyn Grant in 2 weeks to reassess his dizziness and to address his DM.   Orders: [facility]- Est Level  3 (16109)  Complete Medication List: 1)  Coumadin 5 Mg Tabs (Warfarin sodium) .... As directed 2)  Baby Aspirin 81 Mg Chew (Aspirin) .... One by mouth once daily 3)  Nitroquick 0.4 Mg Subl (Nitroglycerin) .... As needed 4)  Klor-con  M20 20 Meq Tbcr (Potassium chloride crys cr) .Marland Kitchen.. 1 tablet two times a day by mouth 5)  Warfarin Sodium 7.5 Mg Tabs (Warfarin sodium) .... Take as directed 6)  Furosemide 40 Mg Tabs (Furosemide) .... Take 1 pill in am and 1 pill in pm 7)  Simvastatin 80 Mg Tabs (Simvastatin) .Marland Kitchen.. 1 at bedtime by mouth 8)  Humulin N 100 Unit/ml Susp (Insulin isophane human) .... 65 units subcutaneously two times a day 9)  Onetouch Ultra Test Strp (Glucose blood) .... Check daily as directed daily icd9 code250.00 10)  Onetouch Lancets Misc (Lancets) .... Check daily as directed  icd9 code 250.00 11)  Metoprolol Tartrate 25 Mg Tabs (Metoprolol tartrate) .Marland Kitchen.. 1 tab by mouth daily for high blood pressure 12)  Hydrocodone-acetaminophen 5-500 Mg Tabs (Hydrocodone-acetaminophen) .... Take 1 pill every 6 hours if needed for pain  Patient Instructions: 1)  Stop the Tramadol. It is making you dizzy.  2)  Restart taking the hydrocodone.  3)  come see Dr. Gwendolyn Grant in 2 weeks.  4)  Try to take it easy today and the rest of the weekend, then increase activity slowly.  5)  Try to walk at least 30 mintutes a day and eat a very healthy (at least 4-5 vegetables a day). Serious lifestyle changes are the only way to decrease your risk for another heart attack or a stroke.

## 2010-04-23 NOTE — Progress Notes (Signed)
  Phone Note Other Incoming   Caller: Brennan Bailey Reason for Call: Discuss lab or test results Details for Reason: Orders for labs Summary of Call: [facility] called to get lab orders for Mr Karwowski. He was discharged yesterday on IV vancomycin. Discussed with Dr. Daiva Eves who said to get labs weekly, CBC, Bmet and vancomycin trough. I called Marcelino Duster back and relayed the orders to her. Initial call taken by: Deirdre Evener RN,  December 24, 2009 10:27 AM

## 2010-04-23 NOTE — Consult Note (Signed)
Summary: [facility]   [facility]   Imported By: Roderic Ovens 01/08/2010 15:16:39  _____________________________________________________________________  External Attachment:    Type:   Image     Comment:   External Document
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
## 2010-04-25 NOTE — Miscellaneous (Signed)
Summary: [facility]: [place] Plan Of  Treatment  [facility]: [place] Plan Of  Treatment   Imported By: Florinda Marker 03/12/2010 14:14:20  _____________________________________________________________________  External Attachment:    Type:   Image     Comment:   External Document

## 2010-04-25 NOTE — Op Note (Signed)
Summary: Consent  Consent   Imported By: Marily Memos 03/13/2010 09:53:07  _____________________________________________________________________  External Attachment:    Type:   Image     Comment:   External Document

## 2010-04-25 NOTE — Progress Notes (Signed)
Summary: Care Plan Oversight  Phone Note Outgoing Call   Call placed by: Acey Lav MD,  December 27, 2009 10:42 PM Details for Reason: Care Plan Oversight Summary of Call: [phone] (30 or more mins)  I have supervised home care and/or infusion therapy for this pt, including providing orders for care, review of labs and/or home health care plans, communicating with the home health care professionals and/or patient/caregivers to integrate current information into the medical treatment plan and/or adjust the medical therapy. This supervision has been provided for _32__minutes during the calendar month. Dates for this oversight _ October 1st thru November 1st 2011_.  Treatment for Mssa bacteremic and septic sternoclavicular arthritis, osteomyelitis   Initial call taken by: Acey Lav MD,  December 27, 2009 10:43 PM  New Problems: OSTEOMYELITIS (ICD-730.20) SEPTIC ARTHRITIS (ICD-711.00) METHICILLIN SUSCEPTIBLE STAPH AUREUS SEPTICEMIA (ICD-038.11)   New Problems: OSTEOMYELITIS (ICD-730.20) SEPTIC ARTHRITIS (ICD-711.00) METHICILLIN SUSCEPTIBLE STAPH AUREUS SEPTICEMIA (ICD-038.11)
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
## 2010-04-25 NOTE — Assessment & Plan Note (Signed)
Summary: generally doesn't feel well, Dr. McDiarmid spoke with patient...   Vital Signs:  Patient profile:   70 year old male Height:      67 inches Weight:      281 pounds BMI:     44.17 Temp:     97.8 degrees F oral Pulse rate:   80 / minute BP sitting:   140 / 76  (left arm) Cuff size:   large  Vitals Entered By: Jimmy Footman, CMA (March 06, 2010 2:25 PM) CC: not feeling well Is Patient Diabetic? Yes Did you bring your meter with you today? No   Primary Care Provider:  Renold Don MD  CC:  not feeling well.  History of Present Illness:    Pt was in the lab this am for Protime had complaint not of feeling well, glucose was 224, , feeling weak today after he saw [employer] (Infectious disease), prescribed bacitracin and a special soap, still on Doxy for MSSA bacteremia, has CT of shoulder pending as well as routine labs to follow status  No fever, occ cough, no N/V, no diarrhea, +dysuria on and off for several weeks, , occ pressure in back no constipation, had typical chest pain last week, took a NTG   Reivwed PMH- history of chronic back, hip, abd pain  Not able to afford the inhalers for COPD   Habits & Providers  Alcohol-Tobacco-Diet     Tobacco Status: quit  Current Medications (verified): 1)  Baby Aspirin 81 Mg  Chew (Aspirin) .... One By Mouth Once Daily 2)  Nitroquick 0.4 Mg  Subl (Nitroglycerin) .... As Needed 3)  Klor-Con M20 20 Meq  Tbcr (Potassium Chloride Crys Cr) .Marland Kitchen.. 1 Tablet Two Times A Day By Mouth 4)  Warfarin Sodium 7.5 Mg Tabs (Warfarin Sodium) .... Take As Directed 5)  Furosemide 40 Mg Tabs (Furosemide) .... Take 1 Pill in Am 6)  Simvastatin 80 Mg Tabs (Simvastatin) .Marland Kitchen.. 1 At Bedtime By Mouth 7)  Humulin N 100 Unit/ml Susp (Insulin Isophane Human) .... 65 Units Subcutaneously Two Times A Day 8)  Onetouch Ultra Test  Strp (Glucose Blood) .... Check Daily As Directed Daily Icd9 Code250.00 9)  Onetouch Lancets  Misc (Lancets) .... Check Daily As  Directed  Icd9 Code 250.00 10)  Metoprolol Tartrate 25 Mg Tabs (Metoprolol Tartrate) .... Out 11)  Lisinopril 10 Mg Tabs (Lisinopril) .... Take 1 Pill Daily For High Blood Pressure 12)  Alprazolam 0.5 Mg Tabs (Alprazolam) .... Take 1 Tab Q 12 Hours As Needed Pain 13)  Wheelchair  Misc (Misc. Devices) .... Powerized Wheelchair 14)  Acetaminophen-Codeine #3 300-30 Mg Tabs (Acetaminophen-Codeine) .... Take 1 Tab Q 8 Hours As Needed Pain 15)  Warfarin Sodium 5 Mg Tabs (Warfarin Sodium) .... Take As Directed 16)  Bupropion Hcl 150 Mg Xr24h-Tab (Bupropion Hcl) .... Take 1 Daily For Depression 17)  Doxycycline Hyclate 100 Mg Tabs (Doxycycline Hyclate) .... Take 1 Tablet By Mouth Two Times A Day 18)  Hibiclens 4 % Liqd (Chlorhexidine Gluconate) .... Wash Once Daily For 7 Days 19)  Mupirocin 2 % Oint (Mupirocin) .... Apply To Inside of Both Nostrils Twice Daily For 7 Days  Allergies (verified): 1)  * Valium  Physical Exam  General:  alert and overweight-appearing.  NAD, not toxic appearing Vital signs noted  Eyes:  non icteric Mouth:  MMM Lungs:  mild bibasilar crackles, no rhonchi, no wheeze, normal WOB Heart:  RRR, no murmur  Abdomen:  NABS, mild diffuse TTP, per report  has this on and off, no gaurding, no rebound, obese abdomen Pulses:  2+ radial Extremities:  +1 BL LE edema with chronic venous stasis changes.     Impression & Recommendations:  Problem # 1:  FATIGUE (ICD-780.79) Assessment New  unclear cause of fatigue, very vague symptoms of not feeling well. Will await labs from ID, check UA per above, no other source of infection, on chronic antibiotics from previous infection. ESR,BMET,CBC pending Given reassurance at this time, glucose slightly elevated, BP stable, afebrile  Orders: [facility]- Est  Level 4 ([address])  Problem # 2:  DYSURIA (ICD-788.1) Assessment: New UA negative, will hold treatment, unable to culture as I did not see results until after hours. Pt notified of  findings If needed repeat culture of Friday when pt has follow-up  His updated medication list for this problem includes:    Doxycycline Hyclate 100 Mg Tabs (Doxycycline hyclate) .Marland Kitchen... Take 1 tablet by mouth two times a day  Orders: Urinalysis-[facility] (00000) [facility]- Est  Level 4 (60454)  Complete Medication List: 1)  Baby Aspirin 81 Mg Chew (Aspirin) .... One by mouth once daily 2)  Nitroquick 0.4 Mg Subl (Nitroglycerin) .... As needed 3)  Klor-con M20 20 Meq Tbcr (Potassium chloride crys cr) .Marland Kitchen.. 1 tablet two times a day by mouth 4)  Warfarin Sodium 7.5 Mg Tabs (Warfarin sodium) .... Take as directed 5)  Furosemide 40 Mg Tabs (Furosemide) .... Take 1 pill in am 6)  Simvastatin 80 Mg Tabs (Simvastatin) .Marland Kitchen.. 1 at bedtime by mouth 7)  Humulin N 100 Unit/ml Susp (Insulin isophane human) .... 65 units subcutaneously two times a day 8)  Onetouch Ultra Test Strp (Glucose blood) .... Check daily as directed daily icd9 code250.00 9)  Onetouch Lancets Misc (Lancets) .... Check daily as directed  icd9 code 250.00 10)  Metoprolol Tartrate 25 Mg Tabs (Metoprolol tartrate) .... Out 11)  Lisinopril 10 Mg Tabs (Lisinopril) .... Take 1 pill daily for high blood pressure 12)  Alprazolam 0.5 Mg Tabs (Alprazolam) .... Take 1 tab q 12 hours as needed pain 13)  Wheelchair Misc (Misc. devices) .... Powerized wheelchair 14)  Acetaminophen-codeine #3 300-30 Mg Tabs (Acetaminophen-codeine) .... Take 1 tab q 8 hours as needed pain 15)  Warfarin Sodium 5 Mg Tabs (Warfarin sodium) .... Take as directed 16)  Bupropion Hcl 150 Mg Xr24h-tab (Bupropion hcl) .... Take 1 daily for depression 17)  Doxycycline Hyclate 100 Mg Tabs (Doxycycline hyclate) .... Take 1 tablet by mouth two times a day 18)  Hibiclens 4 % Liqd (Chlorhexidine gluconate) .... Wash once daily for 7 days 19)  Mupirocin 2 % Oint (Mupirocin) .... Apply to inside of both nostrils twice daily for 7 days  Patient Instructions: 1)  Take it easy today, make sure  you eat your meals 2)  Drink water 3)  I will follow-up your labs done by Dr. Daiva Eves 4)  We will call you with the urine results 5)  Keep your appt tomorrow with Dr. Gwendolyn Grant 6)  We are going to give an extra 7.5mg  dose of coumadin to get your levels back up 7)  If you have fever, vomiting, worse chest pain then go to the ER   Orders Added: 1)  Urinalysis-[facility] [00000] 2)  [facility]- Est  Level 4 [09811]    Laboratory Results   Urine Tests  Date/Time Received: March 06, 2010 2:57 PM  Date/Time Reported: March 06, 2010 3:38 PM   Routine Urinalysis   Color: yellow Appearance: Clear Glucose:  500   (Normal Range: Negative) Bilirubin: negative   (Normal Range: Negative) Ketone: negative   (Normal Range: Negative) Spec. Gravity: 1.025   (Normal Range: 1.003-1.035) Blood: negative   (Normal Range: Negative) pH: 6.0   (Normal Range: 5.0-8.0) Protein: negative   (Normal Range: Negative) Urobilinogen: 0.2   (Normal Range: 0-1) Nitrite: negative   (Normal Range: Negative) Leukocyte Esterace: negative   (Normal Range: Negative)    Comments: .........Marland Kitchenbiochemical negative; microscopic not indicated ...............test performed by......Marland KitchenBonnie A. [place], MLS (ASCP)cm

## 2010-05-01 ENCOUNTER — Ambulatory Visit (HOSPITAL_COMMUNITY)
Admission: RE | Admit: 2010-05-01 | Discharge: 2010-05-01 | Disposition: A | Discharge status: Home / Self Care | Payer: Medicare HMO | Source: Ambulatory Visit | Attending: Infectious Disease | Admitting: Infectious Disease

## 2010-05-01 DIAGNOSIS — M869 Osteomyelitis, unspecified: Secondary | ICD-10-CM

## 2010-05-03 ENCOUNTER — Encounter: Payer: Self-pay | Admitting: Family Medicine

## 2010-05-03 DIAGNOSIS — Z7901 Long term (current) use of anticoagulants: Secondary | ICD-10-CM | POA: Insufficient documentation

## 2010-05-03 DIAGNOSIS — Z8672 Personal history of thrombophlebitis: Secondary | ICD-10-CM

## 2010-05-06 ENCOUNTER — Other Ambulatory Visit: Payer: Self-pay | Admitting: Infectious Disease

## 2010-05-06 ENCOUNTER — Other Ambulatory Visit: Payer: Self-pay

## 2010-05-06 ENCOUNTER — Ambulatory Visit: Payer: Medicare HMO | Admitting: *Deleted

## 2010-05-06 DIAGNOSIS — R52 Pain, unspecified: Secondary | ICD-10-CM

## 2010-05-11 ENCOUNTER — Ambulatory Visit
Admission: RE | Admit: 2010-05-11 | Discharge: 2010-05-11 | Disposition: A | Discharge status: Home / Self Care | Payer: Medicare HMO | Source: Ambulatory Visit | Attending: Infectious Disease | Admitting: Infectious Disease

## 2010-05-11 ENCOUNTER — Other Ambulatory Visit: Payer: Self-pay | Admitting: Infectious Disease

## 2010-05-11 DIAGNOSIS — R52 Pain, unspecified: Secondary | ICD-10-CM

## 2010-05-11 MED ORDER — GADOBENATE DIMEGLUMINE 529 MG/ML IV SOLN
20.0000 mL | Freq: Once | INTRAVENOUS | Status: AC | PRN
  Administered 2010-05-11: 20 mL via INTRAVENOUS

## 2010-05-13 ENCOUNTER — Ambulatory Visit: Payer: Medicare HMO

## 2010-05-13 DIAGNOSIS — Z8672 Personal history of thrombophlebitis: Secondary | ICD-10-CM

## 2010-05-13 DIAGNOSIS — Z7901 Long term (current) use of anticoagulants: Secondary | ICD-10-CM

## 2010-05-16 ENCOUNTER — Other Ambulatory Visit: Payer: Self-pay | Admitting: Family Medicine

## 2010-05-16 DIAGNOSIS — G8929 Other chronic pain: Secondary | ICD-10-CM

## 2010-05-16 NOTE — Telephone Encounter (Signed)
Please review and refill

## 2010-05-16 NOTE — Telephone Encounter (Signed)
Refill request

## 2010-05-20 ENCOUNTER — Ambulatory Visit (INDEPENDENT_AMBULATORY_CARE_PROVIDER_SITE_OTHER): Payer: Medicare HMO | Admitting: Infectious Disease

## 2010-05-20 ENCOUNTER — Encounter: Payer: Self-pay | Admitting: Infectious Disease

## 2010-05-20 ENCOUNTER — Ambulatory Visit (INDEPENDENT_AMBULATORY_CARE_PROVIDER_SITE_OTHER): Payer: Medicare HMO | Admitting: *Deleted

## 2010-05-20 DIAGNOSIS — Z7901 Long term (current) use of anticoagulants: Secondary | ICD-10-CM

## 2010-05-20 DIAGNOSIS — Z8672 Personal history of thrombophlebitis: Secondary | ICD-10-CM

## 2010-05-20 DIAGNOSIS — I38 Endocarditis, valve unspecified: Secondary | ICD-10-CM

## 2010-05-20 DIAGNOSIS — A4902 Methicillin resistant Staphylococcus aureus infection, unspecified site: Secondary | ICD-10-CM

## 2010-05-20 DIAGNOSIS — M009 Pyogenic arthritis, unspecified: Secondary | ICD-10-CM

## 2010-05-20 DIAGNOSIS — M869 Osteomyelitis, unspecified: Secondary | ICD-10-CM

## 2010-05-20 LAB — CONVERTED CEMR LAB
Albumin: 3.8 g/dL (ref 3.5–5.2)
Alkaline Phosphatase: 63 units/L (ref 39–117)
BUN: 12 mg/dL (ref 6–23)
Calcium: 8.6 mg/dL (ref 8.4–10.5)
Chloride: 107 meq/L (ref 96–112)
Glucose, Bld: 163 mg/dL — ABNORMAL HIGH (ref 70–99)
Hemoglobin: 14.6 g/dL (ref 13.0–17.0)
Lymphocytes Relative: 17 % (ref 12–46)
Monocytes Absolute: 1.2 10*3/uL — ABNORMAL HIGH (ref 0.1–1.0)
Monocytes Relative: 10 % (ref 3–12)
Neutro Abs: 7.8 10*3/uL — ABNORMAL HIGH (ref 1.7–7.7)
Potassium: 4.2 meq/L (ref 3.5–5.3)
RBC: 5.64 M/uL (ref 4.22–5.81)
WBC: 11.2 10*3/uL — ABNORMAL HIGH (ref 4.0–10.5)

## 2010-05-28 ENCOUNTER — Encounter: Payer: Self-pay | Admitting: Licensed Clinical Social Worker
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
## 2010-05-31 ENCOUNTER — Other Ambulatory Visit: Payer: Self-pay | Admitting: Family Medicine

## 2010-05-31 DIAGNOSIS — E119 Type 2 diabetes mellitus without complications: Secondary | ICD-10-CM

## 2010-05-31 NOTE — Telephone Encounter (Signed)
Please review and refill

## 2010-06-03 ENCOUNTER — Ambulatory Visit: Payer: Medicare HMO

## 2010-06-04 LAB — GLUCOSE, CAPILLARY: Glucose-Capillary: 224 mg/dL — ABNORMAL HIGH (ref 70–99)

## 2010-06-06 LAB — URINALYSIS, ROUTINE W REFLEX MICROSCOPIC
Bilirubin Urine: NEGATIVE
Hgb urine dipstick: NEGATIVE
Ketones, ur: NEGATIVE mg/dL
Ketones, ur: NEGATIVE mg/dL
Nitrite: NEGATIVE
Nitrite: NEGATIVE
Protein, ur: NEGATIVE mg/dL
Specific Gravity, Urine: 1.046 — ABNORMAL HIGH (ref 1.005–1.030)
Urobilinogen, UA: 1 mg/dL (ref 0.0–1.0)

## 2010-06-06 LAB — POCT CARDIAC MARKERS
CKMB, poc: 1.1 ng/mL (ref 1.0–8.0)
Myoglobin, poc: 79.5 ng/mL (ref 12–200)
Troponin i, poc: <0.05 ng/mL (ref 0.00–0.09)

## 2010-06-06 LAB — GLUCOSE, CAPILLARY
Glucose-Capillary: 135 mg/dL — ABNORMAL HIGH (ref 70–99)
Glucose-Capillary: 103 mg/dL — ABNORMAL HIGH (ref 70–99)
Glucose-Capillary: 106 mg/dL — ABNORMAL HIGH (ref 70–99)
Glucose-Capillary: 120 mg/dL — ABNORMAL HIGH (ref 70–99)
Glucose-Capillary: 120 mg/dL — ABNORMAL HIGH (ref 70–99)
Glucose-Capillary: 123 mg/dL — ABNORMAL HIGH (ref 70–99)
Glucose-Capillary: 126 mg/dL — ABNORMAL HIGH (ref 70–99)
Glucose-Capillary: 132 mg/dL — ABNORMAL HIGH (ref 70–99)
Glucose-Capillary: 141 mg/dL — ABNORMAL HIGH (ref 70–99)
Glucose-Capillary: 141 mg/dL — ABNORMAL HIGH (ref 70–99)
Glucose-Capillary: 145 mg/dL — ABNORMAL HIGH (ref 70–99)
Glucose-Capillary: 147 mg/dL — ABNORMAL HIGH (ref 70–99)
Glucose-Capillary: 147 mg/dL — ABNORMAL HIGH (ref 70–99)
Glucose-Capillary: 149 mg/dL — ABNORMAL HIGH (ref 70–99)
Glucose-Capillary: 152 mg/dL — ABNORMAL HIGH (ref 70–99)
Glucose-Capillary: 153 mg/dL — ABNORMAL HIGH (ref 70–99)
Glucose-Capillary: 153 mg/dL — ABNORMAL HIGH (ref 70–99)
Glucose-Capillary: 154 mg/dL — ABNORMAL HIGH (ref 70–99)
Glucose-Capillary: 159 mg/dL — ABNORMAL HIGH (ref 70–99)
Glucose-Capillary: 162 mg/dL — ABNORMAL HIGH (ref 70–99)
Glucose-Capillary: 168 mg/dL — ABNORMAL HIGH (ref 70–99)
Glucose-Capillary: 171 mg/dL — ABNORMAL HIGH (ref 70–99)
Glucose-Capillary: 175 mg/dL — ABNORMAL HIGH (ref 70–99)
Glucose-Capillary: 176 mg/dL — ABNORMAL HIGH (ref 70–99)
Glucose-Capillary: 196 mg/dL — ABNORMAL HIGH (ref 70–99)
Glucose-Capillary: 209 mg/dL — ABNORMAL HIGH (ref 70–99)
Glucose-Capillary: 209 mg/dL — ABNORMAL HIGH (ref 70–99)
Glucose-Capillary: 210 mg/dL — ABNORMAL HIGH (ref 70–99)
Glucose-Capillary: 212 mg/dL — ABNORMAL HIGH (ref 70–99)
Glucose-Capillary: 215 mg/dL — ABNORMAL HIGH (ref 70–99)

## 2010-06-06 LAB — BASIC METABOLIC PANEL
BUN: 10 mg/dL (ref 6–23)
BUN: 10 mg/dL (ref 6–23)
BUN: 12 mg/dL (ref 6–23)
BUN: 13 mg/dL (ref 6–23)
BUN: 13 mg/dL (ref 6–23)
CO2: 27 mEq/L (ref 19–32)
CO2: 29 mEq/L (ref 19–32)
Calcium: 8.3 mg/dL — ABNORMAL LOW (ref 8.4–10.5)
Calcium: 8.3 mg/dL — ABNORMAL LOW (ref 8.4–10.5)
Calcium: 8.8 mg/dL (ref 8.4–10.5)
Chloride: 105 mEq/L (ref 96–112)
Chloride: 101 mEq/L (ref 96–112)
Chloride: 102 mEq/L (ref 96–112)
Chloride: 102 mEq/L (ref 96–112)
Chloride: 104 mEq/L (ref 96–112)
Chloride: 106 mEq/L (ref 96–112)
Creatinine, Ser: 0.91 mg/dL (ref 0.4–1.5)
Creatinine, Ser: 0.8 mg/dL (ref 0.4–1.5)
Creatinine, Ser: 0.82 mg/dL (ref 0.4–1.5)
Creatinine, Ser: 0.87 mg/dL (ref 0.4–1.5)
Creatinine, Ser: 0.88 mg/dL (ref 0.4–1.5)
Creatinine, Ser: 0.88 mg/dL (ref 0.4–1.5)
GFR calc Af Amer: >60 mL/min (ref >60)
GFR calc Af Amer: >60 mL/min (ref >60)
GFR calc Af Amer: >60 mL/min (ref >60)
GFR calc Af Amer: >60 mL/min (ref >60)
GFR calc Af Amer: >60 mL/min (ref >60)
GFR calc Af Amer: >60 mL/min (ref >60)
GFR calc non Af Amer: >60 mL/min (ref >60)
GFR calc non Af Amer: >60 mL/min (ref >60)
GFR calc non Af Amer: >60 mL/min (ref >60)
GFR calc non Af Amer: >60 mL/min (ref >60)
GFR calc non Af Amer: >60 mL/min (ref >60)
GFR calc non Af Amer: >60 mL/min (ref >60)
GFR calc non Af Amer: >60 mL/min (ref >60)
GFR calc non Af Amer: >60 mL/min (ref >60)
Glucose, Bld: 106 mg/dL — ABNORMAL HIGH (ref 70–99)
Glucose, Bld: 118 mg/dL — ABNORMAL HIGH (ref 70–99)
Glucose, Bld: 122 mg/dL — ABNORMAL HIGH (ref 70–99)
Glucose, Bld: 186 mg/dL — ABNORMAL HIGH (ref 70–99)
Potassium: 4.1 mEq/L (ref 3.5–5.1)
Potassium: 3.7 mEq/L (ref 3.5–5.1)
Potassium: 3.9 mEq/L (ref 3.5–5.1)
Potassium: 4.3 mEq/L (ref 3.5–5.1)
Potassium: 4.3 mEq/L (ref 3.5–5.1)
Potassium: 4.4 mEq/L (ref 3.5–5.1)
Sodium: 130 mEq/L — ABNORMAL LOW (ref 135–145)
Sodium: 134 mEq/L — ABNORMAL LOW (ref 135–145)
Sodium: 134 mEq/L — ABNORMAL LOW (ref 135–145)
Sodium: 140 mEq/L (ref 135–145)
Sodium: 140 mEq/L (ref 135–145)

## 2010-06-06 LAB — ANAEROBIC CULTURE

## 2010-06-06 LAB — LIPASE, BLOOD: Lipase: 21 U/L (ref 11–59)

## 2010-06-06 LAB — CBC
HCT: 36.5 % — ABNORMAL LOW (ref 39.0–52.0)
HCT: 39.8 % (ref 39.0–52.0)
HCT: 39.8 % (ref 39.0–52.0)
HCT: 39.9 % (ref 39.0–52.0)
HCT: 43.8 % (ref 39.0–52.0)
Hemoglobin: 12.6 g/dL — ABNORMAL LOW (ref 13.0–17.0)
Hemoglobin: 12.9 g/dL — ABNORMAL LOW (ref 13.0–17.0)
Hemoglobin: 13.4 g/dL (ref 13.0–17.0)
Hemoglobin: 13.7 g/dL (ref 13.0–17.0)
Hemoglobin: 14.3 g/dL (ref 13.0–17.0)
MCH: 26 pg (ref 26.0–34.0)
MCH: 26.1 pg (ref 26.0–34.0)
MCH: 26.8 pg (ref 26.0–34.0)
MCH: 26.8 pg (ref 26.0–34.0)
MCHC: 32.8 g/dL (ref 30.0–36.0)
MCHC: 33 g/dL (ref 30.0–36.0)
MCHC: 33 g/dL (ref 30.0–36.0)
MCHC: 33.3 g/dL (ref 30.0–36.0)
MCHC: 33.6 g/dL (ref 30.0–36.0)
MCV: 80.6 fL (ref 78.0–100.0)
MCV: 78 fL (ref 78.0–100.0)
MCV: 78.9 fL (ref 78.0–100.0)
MCV: 79.6 fL (ref 78.0–100.0)
MCV: 80.5 fL (ref 78.0–100.0)
MCV: 81.2 fL (ref 78.0–100.0)
MCV: 81.4 fL (ref 78.0–100.0)
MCV: 82 fL (ref 78.0–100.0)
Platelets: 265 10*3/uL (ref 150–400)
Platelets: 190 10*3/uL (ref 150–400)
Platelets: 218 10*3/uL (ref 150–400)
Platelets: 229 10*3/uL (ref 150–400)
Platelets: 256 10*3/uL (ref 150–400)
Platelets: 258 10*3/uL (ref 150–400)
Platelets: 260 10*3/uL (ref 150–400)
Platelets: 264 10*3/uL (ref 150–400)
Platelets: 280 10*3/uL (ref 150–400)
RBC: 4.53 MIL/uL (ref 4.22–5.81)
RBC: 4.82 MIL/uL (ref 4.22–5.81)
RBC: 4.9 MIL/uL (ref 4.22–5.81)
RBC: 4.91 MIL/uL (ref 4.22–5.81)
RBC: 4.92 MIL/uL (ref 4.22–5.81)
RBC: 5.23 MIL/uL (ref 4.22–5.81)
RDW: 17.2 % — ABNORMAL HIGH (ref 11.5–15.5)
RDW: 17.3 % — ABNORMAL HIGH (ref 11.5–15.5)
RDW: 17.4 % — ABNORMAL HIGH (ref 11.5–15.5)
RDW: 17.4 % — ABNORMAL HIGH (ref 11.5–15.5)
RDW: 17.5 % — ABNORMAL HIGH (ref 11.5–15.5)
RDW: 17.5 % — ABNORMAL HIGH (ref 11.5–15.5)
RDW: 17.5 % — ABNORMAL HIGH (ref 11.5–15.5)
WBC: 13.5 10*3/uL — ABNORMAL HIGH (ref 4.0–10.5)
WBC: 12.5 10*3/uL — ABNORMAL HIGH (ref 4.0–10.5)
WBC: 12.9 10*3/uL — ABNORMAL HIGH (ref 4.0–10.5)
WBC: 13.9 10*3/uL — ABNORMAL HIGH (ref 4.0–10.5)
WBC: 14.1 10*3/uL — ABNORMAL HIGH (ref 4.0–10.5)
WBC: 14.4 10*3/uL — ABNORMAL HIGH (ref 4.0–10.5)
WBC: 15.3 10*3/uL — ABNORMAL HIGH (ref 4.0–10.5)
WBC: 17.1 10*3/uL — ABNORMAL HIGH (ref 4.0–10.5)

## 2010-06-06 LAB — TSH: TSH: 1.511 u[IU]/mL (ref 0.350–4.500)

## 2010-06-06 LAB — URINE CULTURE: Culture: NO GROWTH

## 2010-06-06 LAB — PROTIME-INR
INR: 1.2 (ref 0.00–1.49)
INR: 1.13 (ref 0.00–1.49)
INR: 1.38 (ref 0.00–1.49)
INR: 1.4 (ref 0.00–1.49)
INR: 1.41 (ref 0.00–1.49)
INR: 1.53 — ABNORMAL HIGH (ref 0.00–1.49)
INR: 2.02 — ABNORMAL HIGH (ref 0.00–1.49)
Prothrombin Time: 14.6 seconds (ref 11.6–15.2)
Prothrombin Time: 14.7 seconds (ref 11.6–15.2)
Prothrombin Time: 17.2 seconds — ABNORMAL HIGH (ref 11.6–15.2)
Prothrombin Time: 17.4 seconds — ABNORMAL HIGH (ref 11.6–15.2)
Prothrombin Time: 18.6 seconds — ABNORMAL HIGH (ref 11.6–15.2)
Prothrombin Time: 30.6 seconds — ABNORMAL HIGH (ref 11.6–15.2)
Prothrombin Time: 31.9 seconds — ABNORMAL HIGH (ref 11.6–15.2)

## 2010-06-06 LAB — WOUND CULTURE

## 2010-06-06 LAB — CULTURE, BLOOD (ROUTINE X 2)
Culture  Setup Time: 201109182114
Culture  Setup Time: 201109182115
Culture: NO GROWTH

## 2010-06-06 LAB — COMPREHENSIVE METABOLIC PANEL
AST: 18 U/L (ref 0–37)
BUN: 9 mg/dL (ref 6–23)
CO2: 21 mEq/L (ref 19–32)
Calcium: 8.9 mg/dL (ref 8.4–10.5)
Chloride: 104 mEq/L (ref 96–112)
Creatinine, Ser: 0.85 mg/dL (ref 0.4–1.5)
GFR calc Af Amer: >60 mL/min (ref >60)
GFR calc non Af Amer: >60 mL/min (ref >60)
Glucose, Bld: 214 mg/dL — ABNORMAL HIGH (ref 70–99)
Total Bilirubin: 1.2 mg/dL (ref 0.3–1.2)

## 2010-06-06 LAB — DIFFERENTIAL
Basophils Absolute: 0 10*3/uL (ref 0.0–0.1)
Eosinophils Relative: 0 % (ref 0–5)
Lymphocytes Relative: 7 % — ABNORMAL LOW (ref 12–46)
Monocytes Relative: 7 % (ref 3–12)
Neutrophils Relative %: 86 % — ABNORMAL HIGH (ref 43–77)

## 2010-06-06 LAB — CARDIAC PANEL(CRET KIN+CKTOT+MB+TROPI)
CK, MB: 1.4 ng/mL (ref 0.3–4.0)
Relative Index: INVALID (ref 0.0–2.5)
Total CK: 42 U/L (ref 7–232)
Total CK: 46 U/L (ref 7–232)

## 2010-06-06 LAB — HEMOGLOBIN A1C: Mean Plasma Glucose: 166 mg/dL — ABNORMAL HIGH (ref <117)

## 2010-06-06 LAB — URINE MICROSCOPIC-ADD ON

## 2010-06-06 LAB — PSA: PSA: 2.23 ng/mL (ref 0.10–4.00)

## 2010-06-13 ENCOUNTER — Ambulatory Visit (INDEPENDENT_AMBULATORY_CARE_PROVIDER_SITE_OTHER): Payer: Medicare HMO | Admitting: *Deleted

## 2010-06-13 DIAGNOSIS — Z7901 Long term (current) use of anticoagulants: Secondary | ICD-10-CM

## 2010-06-13 DIAGNOSIS — Z8672 Personal history of thrombophlebitis: Secondary | ICD-10-CM

## 2010-06-13 LAB — POCT INR: INR: 2.1

## 2010-06-20 ENCOUNTER — Ambulatory Visit (INDEPENDENT_AMBULATORY_CARE_PROVIDER_SITE_OTHER): Payer: Medicare HMO | Admitting: Family Medicine

## 2010-06-20 ENCOUNTER — Encounter: Payer: Self-pay | Admitting: Family Medicine

## 2010-06-20 VITALS — BP 100/67 | HR 79 | Wt 294.0 lb

## 2010-06-20 DIAGNOSIS — M25519 Pain in unspecified shoulder: Secondary | ICD-10-CM

## 2010-06-20 DIAGNOSIS — E119 Type 2 diabetes mellitus without complications: Secondary | ICD-10-CM

## 2010-06-20 DIAGNOSIS — M7500 Adhesive capsulitis of unspecified shoulder: Secondary | ICD-10-CM

## 2010-06-20 DIAGNOSIS — I872 Venous insufficiency (chronic) (peripheral): Secondary | ICD-10-CM

## 2010-06-20 DIAGNOSIS — F329 Major depressive disorder, single episode, unspecified: Secondary | ICD-10-CM

## 2010-06-20 LAB — BASIC METABOLIC PANEL
BUN: 17 mg/dL (ref 6–23)
Calcium: 9.3 mg/dL (ref 8.4–10.5)
Glucose, Bld: 170 mg/dL — ABNORMAL HIGH (ref 70–99)

## 2010-06-20 MED ORDER — ACETAMINOPHEN-CODEINE #2 300-15 MG PO TABS: 1.0000 | ORAL_TABLET | ORAL | Status: DC | PRN

## 2010-06-20 NOTE — Progress Notes (Signed)
Consent obtained and verified. Time-out conducted. Noted no overlying erythema, induration, or other signs of local infection. Sterile betadine prep. Furthur cleansed with alcohol. Topical analgesic spray: Ethyl chloride. Joint: L subacromial Approached in typical fashion with: 25g needle Completed without difficulty Meds:v 1cc kenalog 40, 4cc lidocaine Advised to call if fevers/chills, erythema, induration, drainage, or persistent bleeding. Injection performed by Ihor Austin. Benjamin Stain, M.D.

## 2010-06-20 NOTE — Patient Instructions (Addendum)
Take the Furosemide 1 pill in the morning, 1 pill at lunch, and 1 pill at 4 pm for the next week.  After that, just take 1 pill in the morning and 1 pill in the evening.   Come back next week for a nurse visit and lab visit.  We will check your blood tests again next week and change your Unna boot.   Come back and see me in 1 month. Come back and see Dr. Karie Schwalbe in 2 months.   Keep your appt on April 5 with Dr. Sheffield Slider here at [facility].

## 2010-06-20 NOTE — Progress Notes (Signed)
  Subjective:    Patient ID: [name], male    DOB: [birth year], 70 y.o.   MRN: [id number]  HPI 1.  LLE swelling:  Swelling has been getting worse for past several weeks.  Remains edematous at baseline, although does have waxing and waning improvements and worsenings.  Taking 40 mg Lasix daily, was taking 3 pills of 80 mg Torsemide 3 times a day before switching PCPs to me, but we stopped this due to low blood pressure, orthostatic hypotension, and general dehydration.  Has taken 2 Lasix daily once or twice in past week to help with swelling.  Still with chronic venous stasis changes.  No chest pain, palpitations, shortness of breath.    2.  Shoulder pain:  Chronic, see previous OV notes which detail this problem.  Still with pain with any sort of movement.  Uses left hand for majority of daily activities.  Tylenol #3 relieves pain.  Takes 1 pill every 1-2 days with relief.  No swelling, redness, fevers, chills.    3.  Depression:  Living at home with wife and daughter.  Worsening home situation according to patient.  States that wife and daughter are very argumentative with him.  States "I want to move out and move into a nursing home."  When pressed on this issue, he states "at least they take care of you there."  Depression has gotten worse at home, though he states today he is feeling pretty good because he is out of the house.  When asked what he has done to work on situation at home, he states that no one listens to him so now he has stopped trying to work things out.  No suicidal/homicidal ideations.  No crying spells.  Some bursts of anger.     Review of Systems See HPI above for review of systems.       Objective:   Physical Exam Gen:  Alert, cooperative patient who appears stated age in no acute distress.  Vital signs reviewed. Pulm:  Clear to auscultation bilaterally with good air movement.  No wheezes or rales noted.   Cardiac:  Regular rate and rhythm without murmur auscultated.   Good S1/S2. Abd:  Soft/obese/nontender.  Good bowel sounds throughout all four quadrants.  No masses noted.  Ext:  No clubbing/cyanosis/erythema.  No edema noted bilateral lower extremities.   Skin:  No lesions or rashes noted. Neuro:  CN II - XII intact.  Ambulates with a cane.  Recall 3 out of 3 immediate, 1 out of 3 after five minutes.  No focal neurological deficits.  Did not complete full MMSE.   Psych:  Pleasant, laughing, conversant       Assessment & Plan:

## 2010-06-20 NOTE — Assessment & Plan Note (Addendum)
Overall worsening, though improved at office visit today. He is scheduled for Geriatrics clinic on April 5.   I would recommend visiting with both husband and wife first, and then talking with them separately.  Due to time constraints, did not perform complete MMSE. Would appreciate if [facility] physicians discuss pro's and con's of nursing home life with patient.   No changes to medications currently.   Plan to re-assess in 1 months time.

## 2010-06-20 NOTE — Assessment & Plan Note (Signed)
Examined by Dr. Karie Schwalbe in clinic today. He is to come back and visit Dr. Karie Schwalbe in 2 months.   Subacromial Kenalog injection today. Patient warned this will increase CBGs, will need to monitor blood sugars for next several days. Overall, frozen shoulder appears to be mildly improving.

## 2010-06-20 NOTE — Assessment & Plan Note (Signed)
Acutely worsening.   Will check creatinine today, increase Lasix to 3 times daily for 1 week, then back off to twice daily indefinitely.  May need to replete potassium, he takes supplements already whenever he needs to increase Lasix dose.  Unna boot placed, will fu in 1 week for it to be replaced.   FU with me in 1 month.

## 2010-06-20 NOTE — Assessment & Plan Note (Signed)
Dual diagnoses rotator cuff syndrome as well as adhesive capsulitis. Pt to RTC 2 months to see Dr. Benjamin Stain for glenohumeral injection if subacromial injection and home therapy do not help.

## 2010-06-27 ENCOUNTER — Ambulatory Visit (INDEPENDENT_AMBULATORY_CARE_PROVIDER_SITE_OTHER): Payer: Medicare HMO | Admitting: *Deleted

## 2010-06-27 ENCOUNTER — Ambulatory Visit (INDEPENDENT_AMBULATORY_CARE_PROVIDER_SITE_OTHER): Payer: Medicare HMO | Admitting: Family Medicine

## 2010-06-27 VITALS — BP 128/67 | HR 70 | Temp 97.6°F | Ht 68.0 in | Wt 286.3 lb

## 2010-06-27 DIAGNOSIS — F3289 Other specified depressive episodes: Secondary | ICD-10-CM

## 2010-06-27 DIAGNOSIS — Z7901 Long term (current) use of anticoagulants: Secondary | ICD-10-CM

## 2010-06-27 DIAGNOSIS — Z8672 Personal history of thrombophlebitis: Secondary | ICD-10-CM

## 2010-06-27 DIAGNOSIS — F329 Major depressive disorder, single episode, unspecified: Secondary | ICD-10-CM

## 2010-06-27 MED ORDER — VENLAFAXINE HCL ER 37.5 MG PO CP24: 37.5000 mg | ORAL_CAPSULE | Freq: Every day | ORAL | Status: DC
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
## 2010-06-27 NOTE — Assessment & Plan Note (Addendum)
In talking with patient and his wife, I feel like the patient has mod-severe depression that may be exacerbated by his recent illness as well other social situations at home which he does not want to talk about at this time.  Has been on bupropion for a while and says it does not seem to be making a difference.  Will change to effexor to help with activation as well as a probable anxiety component.  May have some psychotic features with his depression that is contributing to his outbursts and behavioral problems, although does not endorse any internal stimuli.  May want to consider adding abilify, low dose haldol or other anti-psychotic to his regimen if this continues, although use caution with his diabetes if choosing something like zyprexa.  Do not think this is dementia as his orientation and memory are all well and intact.  Discussed plan with patient and wife, agreeable to try effexor and return in two weeks to see how he is doing, likely will need to titrate up dosage at that time.  No interest in placement at this time.   Reminded that if he has any thoughts of hurting himself or others to call 911 or go to the ER.  Greater than 30 minutes face to face time spent with couple discussing this.

## 2010-06-27 NOTE — Patient Instructions (Addendum)
It was nice seeing you today.  I think that you still have some signs of depression that are ongoing despite your current treatment with bupropion.  I would like for you to stop the bupropion and start a new medication called Effexor.  I think this may help with the feelings you are having.  I would like for you to come back in two weeks to see if this medication is helping, we may need to go up on your dose at that time.  Otherwise I feel that your memory is good, and I think it would be helpful if you talked with Dr. Gwendolyn Grant about some of the feelings that you are having.  If you begin to have feelings of hurting yourself or others please call 911 or go to your closest emergency room.

## 2010-07-01 LAB — BASIC METABOLIC PANEL
BUN: 5 mg/dL — ABNORMAL LOW (ref 6–23)
CO2: 26 mEq/L (ref 19–32)
CO2: 26 mEq/L (ref 19–32)
Calcium: 8.5 mg/dL (ref 8.4–10.5)
Chloride: 104 mEq/L (ref 96–112)
GFR calc Af Amer: >60 mL/min (ref >60)
GFR calc Af Amer: >60 mL/min (ref >60)
GFR calc non Af Amer: 59 mL/min — ABNORMAL LOW (ref >60)
GFR calc non Af Amer: >60 mL/min (ref >60)
Glucose, Bld: 111 mg/dL — ABNORMAL HIGH (ref 70–99)
Glucose, Bld: 166 mg/dL — ABNORMAL HIGH (ref 70–99)
Glucose, Bld: 639 mg/dL (ref 70–99)
Potassium: 2.9 mEq/L — ABNORMAL LOW (ref 3.5–5.1)
Potassium: 3.6 mEq/L (ref 3.5–5.1)
Potassium: 3.8 mEq/L (ref 3.5–5.1)
Sodium: 129 mEq/L — ABNORMAL LOW (ref 135–145)
Sodium: 136 mEq/L (ref 135–145)
Sodium: 139 mEq/L (ref 135–145)

## 2010-07-01 LAB — GLUCOSE, CAPILLARY
Glucose-Capillary: 131 mg/dL — ABNORMAL HIGH (ref 70–99)
Glucose-Capillary: 161 mg/dL — ABNORMAL HIGH (ref 70–99)
Glucose-Capillary: 171 mg/dL — ABNORMAL HIGH (ref 70–99)
Glucose-Capillary: 176 mg/dL — ABNORMAL HIGH (ref 70–99)
Glucose-Capillary: 215 mg/dL — ABNORMAL HIGH (ref 70–99)
Glucose-Capillary: 230 mg/dL — ABNORMAL HIGH (ref 70–99)
Glucose-Capillary: 276 mg/dL — ABNORMAL HIGH (ref 70–99)
Glucose-Capillary: 548 mg/dL (ref 70–99)

## 2010-07-01 LAB — LIPID PANEL
Total CHOL/HDL Ratio: 4.6 RATIO
VLDL: 52 mg/dL — ABNORMAL HIGH (ref 0–40)

## 2010-07-01 LAB — CARDIAC PANEL(CRET KIN+CKTOT+MB+TROPI)
CK, MB: 1.8 ng/mL (ref 0.3–4.0)
Relative Index: INVALID (ref 0.0–2.5)
Troponin I: 0.02 ng/mL (ref 0.00–0.06)

## 2010-07-01 LAB — PROTIME-INR
INR: 3.1 — ABNORMAL HIGH (ref 0.00–1.49)
INR: 4.7 — ABNORMAL HIGH (ref 0.00–1.49)
INR: 4.8 — ABNORMAL HIGH (ref 0.00–1.49)
Prothrombin Time: 48.7 seconds — ABNORMAL HIGH (ref 11.6–15.2)
Prothrombin Time: 49.9 seconds — ABNORMAL HIGH (ref 11.6–15.2)

## 2010-07-01 LAB — CBC
HCT: 37.9 % — ABNORMAL LOW (ref 39.0–52.0)
HCT: 39.3 % (ref 39.0–52.0)
HCT: 39.9 % (ref 39.0–52.0)
Hemoglobin: 12.8 g/dL — ABNORMAL LOW (ref 13.0–17.0)
Hemoglobin: 13.3 g/dL (ref 13.0–17.0)
Hemoglobin: 13.3 g/dL (ref 13.0–17.0)
Hemoglobin: 13.5 g/dL (ref 13.0–17.0)
MCHC: 33.9 g/dL (ref 30.0–36.0)
MCHC: 34.5 g/dL (ref 30.0–36.0)
MCV: 82 fL (ref 78.0–100.0)
MCV: 83.3 fL (ref 78.0–100.0)
Platelets: 170 10*3/uL (ref 150–400)
RBC: 4.69 MIL/uL (ref 4.22–5.81)
RBC: 4.78 MIL/uL (ref 4.22–5.81)
RBC: 4.86 MIL/uL (ref 4.22–5.81)
RDW: 16.9 % — ABNORMAL HIGH (ref 11.5–15.5)
RDW: 17.1 % — ABNORMAL HIGH (ref 11.5–15.5)
RDW: 17.4 % — ABNORMAL HIGH (ref 11.5–15.5)

## 2010-07-01 LAB — COMPREHENSIVE METABOLIC PANEL
BUN: 12 mg/dL (ref 6–23)
CO2: 28 mEq/L (ref 19–32)
Calcium: 8.5 mg/dL (ref 8.4–10.5)
Creatinine, Ser: 1.04 mg/dL (ref 0.4–1.5)
GFR calc non Af Amer: >60 mL/min (ref >60)
Glucose, Bld: 331 mg/dL — ABNORMAL HIGH (ref 70–99)

## 2010-07-01 LAB — URINALYSIS, ROUTINE W REFLEX MICROSCOPIC
Hgb urine dipstick: NEGATIVE
Protein, ur: NEGATIVE mg/dL
Urobilinogen, UA: 0.2 mg/dL (ref 0.0–1.0)

## 2010-07-01 LAB — DIFFERENTIAL
Basophils Absolute: 0 10*3/uL (ref 0.0–0.1)
Eosinophils Relative: 2 % (ref 0–5)
Lymphocytes Relative: 17 % (ref 12–46)
Monocytes Absolute: 0.9 10*3/uL (ref 0.1–1.0)
Monocytes Relative: 9 % (ref 3–12)
Neutro Abs: 7.1 10*3/uL (ref 1.7–7.7)

## 2010-07-01 LAB — HEMOGLOBIN A1C: Hgb A1c MFr Bld: 12.1 % — ABNORMAL HIGH (ref 4.6–6.1)

## 2010-07-01 LAB — POCT CARDIAC MARKERS
CKMB, poc: <1 ng/mL — ABNORMAL LOW (ref 1.0–8.0)
Myoglobin, poc: 102 ng/mL (ref 12–200)

## 2010-07-01 LAB — URINE MICROSCOPIC-ADD ON

## 2010-07-01 LAB — TROPONIN I: Troponin I: 0.02 ng/mL (ref 0.00–0.06)

## 2010-07-01 LAB — CK TOTAL AND CKMB (NOT AT ARMC): Relative Index: INVALID (ref 0.0–2.5)

## 2010-07-03 ENCOUNTER — Encounter: Payer: Self-pay | Admitting: Infectious Disease

## 2010-07-03 ENCOUNTER — Ambulatory Visit (INDEPENDENT_AMBULATORY_CARE_PROVIDER_SITE_OTHER): Payer: Medicare HMO | Admitting: Infectious Disease

## 2010-07-03 DIAGNOSIS — R5383 Other fatigue: Secondary | ICD-10-CM | POA: Insufficient documentation

## 2010-07-03 DIAGNOSIS — I10 Essential (primary) hypertension: Secondary | ICD-10-CM

## 2010-07-03 DIAGNOSIS — R079 Chest pain, unspecified: Secondary | ICD-10-CM | POA: Insufficient documentation

## 2010-07-03 DIAGNOSIS — M869 Osteomyelitis, unspecified: Secondary | ICD-10-CM

## 2010-07-03 DIAGNOSIS — R5381 Other malaise: Secondary | ICD-10-CM

## 2010-07-03 DIAGNOSIS — F329 Major depressive disorder, single episode, unspecified: Secondary | ICD-10-CM

## 2010-07-03 DIAGNOSIS — M009 Pyogenic arthritis, unspecified: Secondary | ICD-10-CM

## 2010-07-03 NOTE — Assessment & Plan Note (Signed)
See plan above with regard to osteomyelitis. We'll continue to observe him off antibiotics.

## 2010-07-03 NOTE — Assessment & Plan Note (Signed)
The patient is quite depressed at times and certainly was during this visit. He denied suicidal or homicidal ideation. He is a nuclear stress by his medical bills.

## 2010-07-03 NOTE — Assessment & Plan Note (Signed)
Patient has sternoclavicular septic arthritis. MRI images appear to show stability of this area. Again he refused laboratory testing.

## 2010-07-03 NOTE — Assessment & Plan Note (Signed)
I attempted to work up the patient for his chest pain. He has known coronary disease. He did have some reproducible chest pain with palpation of the sternum on exam. However given his history and particularly in light of his profound fatigue and malaise I was concerned for the possibility of a cardiac source for her symptoms. However he refused EKG refused further orthostatic vital signs and he refused laboratory testing. He assured me he would followup with his primary care physician in the family medicine clinic. He was in his full capacity in making these decisions.

## 2010-07-03 NOTE — Progress Notes (Signed)
Subjective:    Patient ID: [name], male    DOB: 1940/08/07, 70 y.o.   MRN: [id number]  HPI  70 yo male whom I saw in September who had MRSA bacteremia, likely endocarditis, sternoclavicular septic joint sp surgery by Dr Edwyna Shell with removal of part of clavicle and removal for sternal wire form his CABG. He was given a protracted course of IV antibiotics and then placed on oral doxycyline. He continued to have shoulder pain and actually had steroid injections peformed during this time by sports medicine who think he may have a frozen shoulder. THe shoulder pain had not improved and in at times had worsened. I took him off of doxycylien at the last clinic visit and checked an MRI earlier this month of shoulder and [place] joint. This showed  Chronic right sternoclavicular arthropathy with progressive anterior and superior subluxation of the clavicular head compared prior CT. It also showed  Advanced glenohumeral degenerative changes with largeosteophytes. He continues to have shoulder pain that at times seems better compared to this fall and at other times seems worse. He is without fever,chills or systemic. He has felt sick for 3 days. Was unable to get out of bed due to weakness. Felt like he couldn't breathe. He had chest pain, substernally did not radiate. It lasted 6 or 7 hours. It was pressure like. He had headaches in back of head with blurry vision. Felt nauseous. Did not throw up. He felt dizzy with looking up and with bending down. He was bending down to get shoes and felt dizzy with standing and passed out. No fever. Shoulder hurts all the time but is no worse. He feels weak right now. Try to distort the patient to allow me to workup his multiple symptoms. I felt it was not safe for him to go. However he was in full possession of his capacities and refused any laboratory workup or even be checking orthostatic vital signs. We did id spend over 45 minutes with the patient discussing his care and  trying to work him up but again he refused further workup at this time.  Review of Systems As in history present illness also pertinent for depression denies passive or active suicidal ideation and contracts for safety otherwise 12 point review of systems is negative.    Objective:   Physical Exam    patient is disheveled ill-appearing. He is alert oriented person place location date. Patient is normocephalic atraumatic pupils equal round to light sclerae anicteric oropharynx clear neck supple chest exam revealed regular rate and rhythm no murmurs caps rubs are heard. Lungs to auscultation bilaterally. Palpation of chest wall did reveal discomfort along the sternum. History of clavicular joint was still slightly prominent and tender to palpation. Right shoulder also tender to palpation in this area. On the soft side soft nondistended nontender positive bowel sounds extremities had 2+ edema exam is logical nonfocal nonfocal   Assessment & Plan:   Chest pain I attempted to work up the patient for his chest pain. He has known coronary disease. He did have some reproducible chest pain with palpation of the sternum on exam. However given his history and particularly in light of his profound fatigue and malaise I was concerned for the possibility of a cardiac source for her symptoms. However he refused EKG refused further orthostatic vital signs and he refused laboratory testing. He assured me he would followup with his primary care physician in the family medicine clinic. He was in his full  capacity in making these decisions.  Malaise and fatigue Certainly the differential is broad. Again the patient refused further workup.  OSTEOMYELITIS Patient has sternoclavicular septic arthritis. MRI images appear to show stability of this area. Again he refused laboratory testing.  SEPTIC ARTHRITIS See plan above with regard to osteomyelitis. We'll continue to observe him off antibiotics.  DEPRESSION The  patient is quite depressed at times and certainly was during this visit. He denied suicidal or homicidal ideation. He is a nuclear stress by his medical bills.  HYPERTENSION The patient was concerned that his blood pressure was too high. It may have been but I'm also concerned by some of his symptoms of dizziness with changes in position. Again he refused orthostatic vital sign check.

## 2010-07-03 NOTE — Assessment & Plan Note (Signed)
Certainly the differential is broad. Again the patient refused further workup.

## 2010-07-03 NOTE — Patient Instructions (Addendum)
YOU SHOULD SEE YOUR PCP AS SOON AS POSSIBLE RTC AS NEEDED TO SEE DR. VAN DAM

## 2010-07-03 NOTE — Assessment & Plan Note (Signed)
The patient was concerned that his blood pressure was too high. It may have been but I'm also concerned by some of his symptoms of dizziness with changes in position. Again he refused orthostatic vital sign check.

## 2010-07-11 ENCOUNTER — Ambulatory Visit: Payer: Medicare HMO | Admitting: Family Medicine

## 2010-07-18 ENCOUNTER — Ambulatory Visit: Payer: Medicare HMO

## 2010-07-29 ENCOUNTER — Encounter: Payer: Self-pay | Admitting: Home Health Services

## 2010-07-31 ENCOUNTER — Ambulatory Visit (INDEPENDENT_AMBULATORY_CARE_PROVIDER_SITE_OTHER): Payer: Medicare HMO | Admitting: *Deleted

## 2010-07-31 DIAGNOSIS — Z8672 Personal history of thrombophlebitis: Secondary | ICD-10-CM

## 2010-07-31 DIAGNOSIS — Z7901 Long term (current) use of anticoagulants: Secondary | ICD-10-CM

## 2010-07-31 LAB — POCT INR: INR: 3

## 2010-08-05 ENCOUNTER — Other Ambulatory Visit: Payer: Self-pay | Admitting: Family Medicine

## 2010-08-05 NOTE — Telephone Encounter (Signed)
Refill request

## 2010-08-06 NOTE — Procedures (Signed)
BYPASS GRAFT EVALUATION   INDICATION:  Followup left lower extremity bypass graft, increased lower  extremity pain per the patient.   HISTORY:  Diabetes:  Yes.  Cardiac:  CABG, MI.  Hypertension:  Yes.  Smoking:  Quit.  Previous Surgery:  Repair of thrombosed left popliteal aneurysm with  tibial artery thrombectomy and conversion of left fem-pop bypass graft  to a fem-tibioperoneal bypass on 06/22/2006.   SINGLE LEVEL ARTERIAL EXAM                               RIGHT              LEFT  Brachial:                    112                117  Anterior tibial:             128                82  Posterior tibial:            150                115  Peroneal:  Ankle/brachial index:        1.28               0.98   PREVIOUS ABI:  Date:  12/30/2007  RIGHT:  1.06  LEFT:  0.6   LOWER EXTREMITY BYPASS GRAFT DUPLEX EXAM:   DUPLEX:  Doppler arterial waveforms appear triphasic proximal to, within  and distal to left lower extremity bypass graft.   IMPRESSION:  1. Patent left femoral-tibioperoneal artery bypass graft.  2. Right ABI appears stable from previous study.  3. Left ABI shows increase from previous study, however, it is      consistent with study of 04/22/2007.   ___________________________________________  P. Liliane Bade, M.D.   AS/MEDQ  D:  07/06/2008  T:  07/06/2008  Job:  [id number]

## 2010-08-06 NOTE — Assessment & Plan Note (Signed)
[facility]   NAME:  [name], [name]               ACCOUNT NO.:  [id number]   MEDICAL RECORD NO.:  [id number]      DATE OF BIRTH:  11/23/1940   PHYSICIAN:  Jake Shark A. Tanda Rockers, M.D.      VISIT DATE:                                   OFFICE VISIT   SUBJECTIVE:  [name] is a 70 year old man who was initially seen in  consultation on September 21, 2006.  His diagnosis was a postsurgical  dehiscence complicated by fluid retention.  The patient was referred for  and was considered for wound vac, but the wound vac was never placed.  He returns for followup.  He reports that there is been continued  swelling with moderate pain, which is well controlled with Tylenol #3.  He continues to be ambulatory.  There has been no fever.  There has been  no malodor, but there is been moderate drainage.   OBJECTIVE:  Blood pressure is 113/74, respirations 20, pulse rate 91,  temperature 97.8, capillary blood glucoses 202 mg percent.  Inspection of his wounds demonstrates that the left great toe nail,  which was ingrown, has resolved.  Wound #2 of the left medial leg is  persistent associated with 3+ edema and hyperemia.  There is moderate  exudate.  There is no frank lymphangitis or abscess formation.  There is  no evidence of vascular compromise.  The pedal pulse is indeterminate  due to the degree of edema.  The capillary refill is brisk.  The right  dorsalis pedis pulses readily palpable.   IMPRESSION:  Postsurgical stasis ulcer with uncontrolled edema.   PLAN:  We will culture the wound.  Will perform an ABI prior to the  placement of compression wrap utilizing Unna boot protocol.  We will add  a silver alginate to the dressing and reevaluate the patient in 1 week.  If the cultures are positive we will consider antibiotic therapy in  addition.      Harold A. Tanda Rockers, M.D.  Electronically Signed     HAN/MEDQ  D:  10/06/2006  T:  10/06/2006  Job:  [id number]

## 2010-08-06 NOTE — Assessment & Plan Note (Signed)
OFFICE VISIT   [name], [name]  DOB:  February 16, 1941                                        January 23, 2010  CHART #:  [id number]   HISTORY:  The patient is a 70 year old male recently hospitalized, who  on December 21, 2009, underwent an incision and drainage of an abscess  with removal of sternal wire in the right sternoclavicular joint.  The  patient is currently on a long-term antibiotic therapy for endocarditis  as well.  Currently, he reports that he is feeling fairly well.  He has  not had any recent fevers, chills, or other notable constitutional  symptoms.  He has had no drainage or difficulty with the incision  itself.   PHYSICAL EXAMINATION:  Vital Signs:  Blood pressure is 129/70, pulse  100, respirations 18, and oxygen saturation is 91% on room air.  General  Appearance:  A chronically ill-appearing male, in no acute distress.  Pulmonary:  Clear breath sounds bilaterally, slightly diminished in the  bases.  Cardiac:  Regular rate and rhythm.  No definite murmur  auscultated.  Incision is inspected, healing well without evidence of  infection.   ASSESSMENT:  The patient is recovering well from his procedure.  Dr.  Edwyna [name] inspected the wound and discussed this with the patient and feels  as though at this time, we can see him again on a p.r.n. basis.   [name], P.A.-C.   [name]  [name]:  01/23/2010  T:  01/24/2010  Job:  [id number]

## 2010-08-06 NOTE — Assessment & Plan Note (Signed)
OFFICE VISIT   AMIT, LEECE D  DOB:  [birth year]                                       10/01/2009  CHART#:[id number]   The patient comes back in today.  He has undergone left popliteal to  tibial bypass graft for a popliteal aneurysm.  This was done with  cephalic vein in 7902.  He is here for followup.  This was done by Dr.  Madilyn Fireman.  The patient unfortunately had to wait awhile to get his  ultrasound.  After trying to discuss his problems, he says that he no  longer wants to have anything done with this office and does not want  any other vascular care done.  I explained to him that his bypass was  open and that we need to continue to have surveillance to make sure that  it does not occlude which would put him at risk for limb loss.  However,  he does not want to deal with this.  He says that he has better things  to do with his time than to come have his arteries evaluated.  I have  elected to have him come back to see me on a p.r.n. basis.  Certainly if  he needs anything done, we would be happy to help him, but at this time  he does not wish to have our assistance.     Jorge Ny, MD  Electronically Signed   VWB/MEDQ  D:  10/01/2009  T:  10/02/2009  Job:  [id number]   cc:   Renold Don, MD

## 2010-08-06 NOTE — Assessment & Plan Note (Signed)
[facility]                                 ON-CALL NOTE   LARONN, DEVONSHIRE                      MRN:          [id number]  DATE:11/14/2006                            DOB:          [birth year]    PHONE NUMBER:  [phone]   Phone call came from his wife, Joshaua Epple.   PRIMARY CARE PHYSICIAN:  Arta Silence, MD   TIME OF CALL:  10:08 a.m. on August 23   She is concerned because he has hives and wanted him seen so an  appointment was given for today's clinic.     Karie Schwalbe, MD  Electronically Signed    RIL/MedQ  DD: 11/14/2006  DT: 11/15/2006  Job #: [id number]   cc:   Arta Silence, MD

## 2010-08-06 NOTE — Assessment & Plan Note (Signed)
[facility]   NAME:  KOURY, RODDY               ACCOUNT NO.:  [id number]   MEDICAL RECORD NO.:  [id number]      DATE OF BIRTH:  12-31-1940   PHYSICIAN:  Jake Shark A. Tanda Rockers, M.D.      VISIT DATE:                                   OFFICE VISIT   SUBJECTIVE:  Mr. Aldridge returns for follow-up of the stasis ulcer.  In  the interim he has remained ambulatory.  There has been decreased  drainage.  No malodor and no fever.   OBJECTIVE:  Blood pressure is 103/56, respirations 18, pulse rate 70,  temperature 98.  Capillary blood glucose's 202.  Inspection of the left  medial lower extremity shows that there is continued contraction of the  ulcer. The foot is warm. Capillary refill is brisk.  There is no  evidence of ischemia or ascending infection.  There remains 2+ edema.   IMPRESSION:  Clinical improvement of post-traumatic stasis ulcer.   PLAN:  Will continue the Unna wrap protocol and reevaluate the patient  in 1 week.      Harold A. Tanda Rockers, M.D.  Electronically Signed     HAN/MEDQ  D:  10/26/2006  T:  10/26/2006  Job:  [id number]

## 2010-08-06 NOTE — Discharge Summary (Signed)
NAMELADAMIEN, RAMMEL               ACCOUNT NO.:  [id number]   MEDICAL RECORD NO.:  [id number]          PATIENT TYPE:  INP   LOCATION:  4743                         FACILITY:  [facility]   PHYSICIAN:  Valerie A. Felicity Coyer, MDDATE OF BIRTH:  January 10, 1941   DATE OF ADMISSION:  10/23/2007  DATE OF DISCHARGE:  10/26/2007                               DISCHARGE SUMMARY   DIAGNOSES AT THE TIME OF DISCHARGE:  1. Left lower extremity pain and the patient with history of      peripheral vascular disease, status post ABI this admission noting      a value of 0.83 in left lower extremity which has improved from ABI      of April 2009.  2. Chronic obstructive pulmonary disease/obstructive sleep apnea, at      baseline.  3. Chronic systolic heart failure with left ventricular ejection      fraction of 35%, clinically compensated on ACE inhibitor, beta-      blocker, and Demadex.  4. History of anxiety/depression,  continue Lexapro and p.r.n.      benzodiazepines.  5. History of gastroesophageal reflux disease.  6. Diabetes type 2, currently stable.  7. Benign prostatic hypertrophy.  8. Dyslipidemia.  9. History of restless legs syndrome.   HISTORY OF PRESENT ILLNESS:  Mr. [name] is a 70 year old white male  admitted on October 24, 2007, with complaint of left calf pain.  He also  noted some substernal chest pain earlier in the day on the day of  admission.  He is maintained on chronic Coumadin, states that his INR  was supratherapeutic approximately 2 weeks ago.  His INR at the time of  admission is 1.5.  He was admitted for further evaluation of left lower  extremity pain and to rule out DVT.   COURSE OF HOSPITALIZATION:  Left lower extremity pain.  The patient was  admitted.  He underwent bilateral ABIs, which noted right side 1.35 and  left side 0.83, which was improved from values on July 15, 2007.  In  addition, there was no evidence for lower extremity Doppler.  The  patient was placed on a  Lovenox bridge in the setting of subtherapeutic  INR.  This will be continued at the time of discharge and will need to  be continued for a 24-hour overlap of therapeutic INR.  INR today is  1.7.  He is scheduled for followup on Thursday with Everrett Coombe, nurse  practitioner.   In addition, the patient's chest pain is resolved, and his cardiac  enzymes were negative.   MEDICATIONS AT THE TIME OF DISCHARGE:  1. Lipitor 80 mg p.o. daily.  2. Klor-Con 20 mEq 2 tablets p.o. b.i.d.  3. Alprazolam 0.5 mg 1 tablet p.o. b.i.d. as needed.  4. NitroQuick 0.4 mg sublingual as needed.  5. Warfarin 5 mg on Tuesdays and Thursdays and 7.5 mg on Saturdays,      Sundays, Mondays, Wednesdays, and Fridays.  6. APAP with codeine 300/30 one to two tablets p.o. q.6 h. p.r.n.  7. Isosorbide 30 mg p.o. daily.  8. Torsemide 20 mg p.o.  t.i.d.  9. Metoprolol 25 mg one-half tablet p.o. daily.  10.Lexapro 20 mg p.o. daily.  11.Ecotrin 81 mg p.o. daily.  12.Lovenox 135 mg subcutaneous injection every 12 hours to be      discontinued after 24-hour therapeutic INR overlap.  13.Insulin 70/25, 90 units subcu b.i.d.  14.DuoNeb 4 times daily as needed.  15.Colace 100 mg p.o. b.i.d.   PERTINENT LABORATORY DATA AT THE TIME OF DISCHARGE:  INR 1.7.  Hemoglobin 11.6 and hematocrit 35.5.  BUN 18, creatinine 1.4, sodium  137, and potassium 4.2.   DISPOSITION:  The patient was discharged to home.  We will continue home  health RN to assist with Lovenox and also ask for home health PT  evaluation for home safety evaluation.   FOLLOWUP:  The patient is scheduled to follow up with Everrett Coombe, nurse  practitioner on Thursday October 28, 2007, at 2:30 p.m.  He will need to  follow up PT/INR at that time.  Instructions for discontinuation of  Lovenox, pending 24-hour therapeutic INR.   Greater than 30 minutes were spent on discharge planning.       Sandford Craze, NP      Raenette Rover. Felicity Coyer, MD   Electronically Signed    MO/MEDQ  D:  10/26/2007  T:  10/27/2007  Job:  [id number]   cc:   Arta Silence, MD  P. Liliane Bade, M.D.

## 2010-08-06 NOTE — Discharge Summary (Signed)
[name], [name]               ACCOUNT NO.:  [id number]   MEDICAL RECORD NO.:  [id number]          PATIENT TYPE:  INP   LOCATION:  4742                         FACILITY:  [facility]   PHYSICIAN:  Lonia Blood, M.D.       DATE OF BIRTH:  07-01-1940   DATE OF ADMISSION:  09/28/2008  DATE OF DISCHARGE:  10/01/2008                               DISCHARGE SUMMARY   PRIMARY CARE PHYSICIAN:  Arta Silence, MD, with [facility].   DISCHARGE DIAGNOSES:  1. 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  2. Uncontrolled diabetes mellitus type 2.  3. Chronic systolic congestive heart failure.  4. Coronary artery disease status post coronary bypass grafting.  5. Peripheral vascular disease.  6. Recurrent deep venous thrombosis.  7. Depression and anxiety.  8. Benign prostatic hypertrophy.  9. Chronic obstructive pulmonary disease.  10.Hyperlipidemia.  11.Hypokalemia - resolved.   DISCHARGE MEDICATIONS:  1. Coumadin 5 mg daily.  2. Torsemide 60 mg twice a day.  3. Sertraline 50 mg at bedtime.  4. Flexeril 10 mg three times a day.  5. Potassium chloride 20 mEq three times a day.  6. Simvastatin 80 mg daily.  7. Isosorbide mononitrate 30 mg daily.  8. Tylenol with Codeine every 4-6 hours as needed for pain.  9. Metoprolol 12.5 mg daily.  10.Tylenol 625 mg every 8 hours as needed for pain.  11.Aspirin 81 mg daily.  12.Insulin NPH 60 units twice a day.   CONDITION ON DISCHARGE:  [name] was discharged in good condition,  afebrile, with normal vital signs, alert, oriented with CBG's ranging  from 100 to 220.  The patient was told to follow up with his primary  care physician for diabetes adjustment as well as Coumadin checks.  Of  note, the discharge INR is 2.0.   PROCEDURES:  None.   CONSULTATION DURING THIS ADMISSION:  None.   HISTORY AND PHYSICAL:  Refer to dictated H&P done by Dr. Adela Glimpse on September 29, 2008.   HOSPITAL COURSE:  [name] is a  70 year old gentleman with  significant diabetes presented to the emergency room with complaints of  significant weakness and too high to read sugars.  He reported to me  that he was unable to afford insulin so he stopped taking it.  He was  diagnosed with hyperosmolar nonketotic syndrome and he was placed on  intravenous insulin drip as well as intravenous fluids.  Upon correcting  the hyperglycemia, the patient's status improved and we transitioned him  to 60 units of NPH insulin twice a day which he was instructed to  titrate up by 2 units on a daily basis to achieve a goal morning fasting  sugar of less than 100.  1. Overanticoagulation upon admission.  Upon presentation, Mr.      Welter INR was 4.7.  This was not so unusual as he was unable to      eat for the past couple of days.  In reviewing the patient's      status, I have asked him repeatedly and he thinks he had  2 separate      episodes of deep venous thrombosis.  For this reason, he needs      lifelong Coumadin.  The medication was resumed in the hospital.  He      will continue taking 5 mg daily and follow up with his primary care      physician.  2. Hypokalemia.  Due to polyuria and diuresis from hyperglycemia, the      patient's potassium was being replaced in the hospital and his      discharge K is 3.7.  3. The patient's chronic systolic congestive heart failure has      remained stable throughout this admission without any need for      interventions.  The patient was educated about fluid restriction,      low-sodium heart diet.  He was told to resume his diuretic at the      time of discharge.      Lonia Blood, M.D.  Electronically Signed     SL/MEDQ  D:  10/01/2008  T:  10/02/2008  Job:  [id number]   cc:   Arta Silence, MD

## 2010-08-06 NOTE — Assessment & Plan Note (Signed)
[facility]   NAME:  [name], [name] NO.:  [id number]   MEDICAL RECORD NO.:  [id number]      DATE OF BIRTH:  Feb 09, 1941   PHYSICIAN:  Maxwell Caul, M.D. VISIT DATE:  11/04/2006                                   OFFICE VISIT   Mr. [name] returns today in follow-up from his appointment of August  11.  At that point in time he was diagnosed with cellulitis.  He was  started on Augmentin.  We did not put compression back on his leg  because of the pain and significant area of cellulitis.  He has been  applying Bactroban to the wound and ACE wrapping.   EXAMINATION:  His temperature is 98.2, pulse 85, respirations 18, blood  pressure 114/59, blood glucose 182.  The wound on the left anterior  lower leg does not look any different, still at the same dimensions 3.2  x 0.9 x 0.1.  The area of intense erythema around this wound and with  warmth and pain that I marked last time is considerably improved.  The  patient states this feels better.   IMPRESSION:  Cellulitis of the leg.  I will continue the same treatment  as outlined on August 11 including the Bactroban b.i.d., ACE wrap and he  is to continue on the Augmentin.  We will see him again on Monday or  Tuesday of next week, at which point in time it may be safe to return  him to his previous compression.   Of note, there was some confusion of whether the patient was on  ciprofloxacin on August 11.  We have verified that he was not on this  antibiotic, therefore he is to continue on the Augmentin.           ______________________________  Maxwell Caul, M.D.     MGR/MEDQ  D:  11/04/2006  T:  11/05/2006  Job:  [id number]

## 2010-08-06 NOTE — Assessment & Plan Note (Signed)
[facility]   NAME:  [name], [name]               ACCOUNT NO.:  [id number]   MEDICAL RECORD NO.:  [id number]      DATE OF BIRTH:  May 02, 1940   PHYSICIAN:  Jake Shark A. Tanda Rockers, M.D. 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  In the interim we have  treated him with an Radio broadcast assistant.  He returns for follow-up.  He is  accompanied with his wife.   OBJECTIVE:  Blood pressure is 88/58, respirations 18, pulse rate 68,  temperature 98.1.  Capillary blood glucose is 143 mg percent.  The  patient appears in no acute distress.  He is alert, oriented and freely  ambulatory.  Inspection of his left lower extremity shows that the wound  has significantly contracted, the edema is judged at 2+ with mild  hyperemia and no evidence of cellulitis or a ascending infection.  The  pedal pulse.  Remains indeterminate but the capillary refill is brisk  and an interim report from the vascular lab shows an ankle-brachial  index of 0.66 and 1.0 in the left and right lower extremities  respectively.  There has been definite decrease in area and with  advancing epithelium in the wound.   ASSESSMENT:  Clinically improved wound.   PLAN:  We will continue the Unna boot dressing.  If there is any  evidence of retrogression of this wound we will refer him to the  vascular surgeon for consultation to determine the significance of the  abnormal ABI.  On this exam there appears to be no significant deterrent  to healing afforded by the abnormalities suggested by the ABI.Marland Kitchen      Harold A. Tanda Rockers, M.D.  Electronically Signed     HAN/MEDQ  D:  10/19/2006  T:  10/19/2006  Job:  [id number]

## 2010-08-06 NOTE — Assessment & Plan Note (Signed)
OFFICE VISIT   [name], [name]  DOB:  01-Jan-1941                                       09/03/2006  CHART#:[id number]   Mr. [name] returns to the office today.  He is status post resection of  left popliteal aneurysm with left tibial artery thrombectomy x3 and  cephalic vein revision of left femoral popliteal bypass to left femoral  tibial bypass.  He has chronic venous insufficiency with chronic  swelling and pigmentation of his left leg.  He has developed breakdown  of the most distal incision in the popliteal area of his left leg.  This  has been treated with dressing changes.  The wound is clean.  There is  chronic lower extremity edema.  He also has post ischemic changes in his  left great toe.  This has improved considerably since his procedure was  done.   Will plan [facility], Dr. Tanda Rockers.  Also, x-rays  of the left foot to rule out osteomyelitis and return office visit in 1  month.   Balinda Quails, M.D.  Electronically Signed   PGH/MEDQ  D:  09/03/2006  T:  09/04/2006  Job:  [id number]   cc:   Jake Shark A. Tanda Rockers, M.D.

## 2010-08-06 NOTE — Procedures (Signed)
BYPASS GRAFT EVALUATION   INDICATION:  Follow-up evaluation of left leg bypass graft.   HISTORY:  Diabetes:  Yes.  Cardiac:  MI and CABG.  Hypertension:  Yes.  Smoking:  Quit in 1996.  Previous Surgery:  Repair and graft of thrombosed left popliteal  aneurysm and left tibial thrombectomy x3.  Conversion of left fem-fem-  tibioperoneal bypass graft with reverse cephalic vein by Dr. Madilyn Fireman on  06/22/06.   SINGLE LEVEL ARTERIAL EXAM                               RIGHT              LEFT  Brachial:                    84                 90  Anterior tibial:             120                60  Posterior tibial:            90                 98  Peroneal:  Ankle/brachial index:        >1.0               >1.0   PREVIOUS ABI:  Date: 06/23/06  RIGHT:  >1.0  LEFT:  0.54   LOWER EXTREMITY BYPASS GRAFT DUPLEX EXAM:   DUPLEX:  Doppler arterial waveforms are triphasic proximal to, within,  and distal to the bypass graft.   IMPRESSION:  1. Right ankle brachial index is stable from previous study.  2. Left ankle brachial index is higher than previously recorded.  3. Patent left femoral-to-tibial-peroneal trunk bypass graft.   ___________________________________________  P. Liliane Bade, M.D.   MC/MEDQ  D:  04/22/2007  T:  04/23/2007  Job:  [id number]

## 2010-08-06 NOTE — Assessment & Plan Note (Signed)
[facility]   NAME:  BHAVIK, CABINESS               ACCOUNT NO.:  [id number]   MEDICAL RECORD NO.:  [id number]      DATE OF BIRTH:  09-15-1940   PHYSICIAN:  Jake Shark A. Tanda Rockers, M.D. VISIT DATE:  11/11/2006                                   OFFICE VISIT   SUBJECTIVE:  Mr. Scharlene Gloss was last seen November 04, 2006, for management of  a postoperative stasis ulcer in the left lower extremity.  He has been  treated in the interim with Bactroban and started on Augmentin for a  question of cellulitis.  In the interim he has had no drainage.  There  is been no malodor.  He continues to be ambulatory.  He continues to  take diuretic. There is been no fever, although he feels that the leg  has been warm at times and continues to be discolored.   OBJECTIVE:  VITAL SIGNS: Blood pressure is 95/67, respirations 18, pulse  rate 103, temperature is 97.6.  Capillary blood glucose is 170 mg%.  Inspection of the left lower extremity shows a persistence of 3+ edema.  There is no particular tenderness in the at the popliteal area nor in  the groin.  There are chronic changes of stasis. The previous noted  wound in the left anterior lower leg is 100% re-epithelialized. There is  an inferior new area of superficial blister that is noninflamed. The  pedal pulses are indeterminate due to the edema but a pencil Doppler  exam shows a polyphasic signal at the popliteal on the left and a  biphasic signal at the posterior tibia. Capillary refill remains brisk.   ASSESSMENT:  Persistent stasis.   PLAN:  We are recommending that the patient be placed in mild  compression utilizing a Profore-Lite. We will continue triamcinolone  cream. We will not renew the current prescription for antibiotics rather  we interpret physical findings to be those of postoperative stasis.  We  will elevate the leg.  We will give the patient Tylenol #3, a total of  #20 tablets, to take 1-2 every 6-8 hours p.r.n. for pain.   If the  patient develops racing tachycardia fever or progressive pain and  discomfort he is to call the wound center for a priority of evaluation.  If he develops symptoms after hours on the weekend, he is to be seen in  the emergency room for the same.  We have thick explained these  instructions to the patient in terms he seems to understand.  He  expresses gratitude for having been seen in the clinic and indicates  that he will be compliant.      Harold A. Tanda Rockers, M.D.  Electronically Signed     HAN/MEDQ  D:  11/11/2006  T:  11/11/2006  Job:  [id number]

## 2010-08-06 NOTE — Assessment & Plan Note (Signed)
[facility]   NAME:  [name], [name]               ACCOUNT NO.:  [id number]   MEDICAL RECORD NO.:  [id number]      DATE OF BIRTH:  February 21, 1941   PHYSICIAN:  Jake Shark A. Tanda Rockers, M.D. VISIT DATE:  10/14/2006                                   OFFICE VISIT   SUBJECTIVE:  [name] is a 70 year old man whom we are following for  a stasis ulcer involving the left lower extremity.  In the interim, we  have treated him with compression wrap, but he discontinued the wrap due  to excessive drainage.  He reports that his pain is markedly reduced.  There has been no interim fever.  He continues to be ambulatory but  tries to elevate his leg as much as possible.  He did not proceed with  vascular lab studies.  He denies any symptoms of claudication.   OBJECTIVE:  VITAL SIGNS:  Blood pressure is 106/58, respirations 22,  pulse rate of 68, temperature  98.4.  Capillary blood glucose is 145 mg  percent.   Inspection of the left great toe shows that this wound is completely  resolved. The left medial wound on the left lower extremity is hyperemic  with a moist exudate which was debrided with a #10 blade without  difficulty. Nonviable tissue was excised, and hemorrhage was controlled  with direct pressure.  There is no evidence of ascending infection or  abscess.  There is no malodor.  Capillary refill is normal. Pedal pulses  are indeterminate and are consistent with a previous ABI. There is no  evidence of concurrent ischemia, however.  The most recent culture grew  Pseudomonas anginosa.   ASSESSMENT:   ASSESSMENT:  Stasis ulcer, improved with compression.   PLAN:  1. We will increase the frequency of dressing changes to avoid extreme      saturation.  2. We will add a silver cell dressing to contribute positively to the      wound environment and reapply an Unna wrap.  3. We will reevaluate the patient in 4 days.      Harold A. Tanda Rockers, M.D.  Electronically  Signed     HAN/MEDQ  D:  10/14/2006  T:  10/14/2006  Job:  [id number]

## 2010-08-06 NOTE — Assessment & Plan Note (Signed)
[facility]   NAME:  KUPER, RENNELS NO.:  [id number]   MEDICAL RECORD NO.:  [id number]      DATE OF BIRTH:  1940/07/11   PHYSICIAN:  Maxwell Caul, M.D.      VISIT DATE:                                   OFFICE VISIT   Mr. [name] is seen here from his review on June 30.  He had 2 issues.  One was a wound on his left great toe secondary to the trauma of an  ingrown toenail since this has been removed.  We placed him in a healing  sandal with daily antiseptic soap washes.  His major issue is a post  surgical wound on his left medial leg after a revascularization  procedure.  He developed fluid retention, stasis changes were present,  and the wound dehisced.  We had ordered a wound vac last week.   For a reasons that are not totally clear the wound vac has not been  applied.  I am not precisely sure what the patient is doing to this  wound at home.  However, he is ACE wrapping it.  I do not think he is  applying anything in the wound bed itself.   WOUND EXAM:  His temperature is 97, a pulse 89, respirations 22, blood  pressure 119/65, blood glucose 179.  On the left medial leg, his wound measures 5.5x2x0.5.  The wound bed of  this is well granulated.  However, it is a fairly deep wound.  There are  stasis changes around this with some edema.  His circulation shows his  dorsalis pedis pulses and posterior tibial pulses to be nonpalpable.  However, Dr. Tanda Rockers makes reference to polyphasic Doppler sounds last  week.  Nevertheless, the approach here has been to try and avoid  compression because of vascular compromise.   IMPRESSION:  1. Post surgical wound complicated by venous stasis.  I have      instructed the staff to revisit the [facility] wound vac issue.  We will      get this organized today.  In the meantime we have applied hydrogel      gauze and Ace wrapped his leg.  2. Traumatic lesion on his left great toe.  I think this wound  looks      fine.  I do not think there are any particular problems.  It is      well on its way to healing with continuing of the daily washes and      the healing sandal.   We will review him again in a week.           ______________________________  Maxwell Caul, M.D.     MGR/MEDQ  D:  09/29/2006  T:  09/29/2006  Job:  [id number]

## 2010-08-06 NOTE — Assessment & Plan Note (Signed)
OFFICE VISIT   [name], [name]  DOB:  03-17-1941                                       10/01/2006  CHART#:[id number]   OFFICE NOTE   The patient returns today for continued followup after undergoing left  popliteal aneurysm resection with left tibial artery thrombectomy and  cephalic vein revision of left femoral popliteal bypass to left femoral  tibial bypass carried out 06/22/2006 at [facility].   He has an area of slow healing and wound breakdown in the left mid calf.  He has chronic venous insufficiency associated with this.   He has been referred to Dr. Tanda Rockers at the wound care center.  The plan  at this time is to place a vac device.   BP 132/82, pulse 96 per minute and regular, respirations 18 per minute.  The left leg is well-perfused.  An area of skin breakdown in the left  calf is granulating.   We will plan followup at this time per protocol.   Balinda Quails, M.D.  Electronically Signed   PGH/MEDQ  D:  10/01/2006  T:  10/02/2006  Job:  [id number]
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
## 2010-08-06 NOTE — Procedures (Signed)
BYPASS GRAFT EVALUATION   INDICATION:  Follow up left lower extremity bypass graft.   HISTORY:  Diabetes:  Yes.  Cardiac:  CABG, MI.  Hypertension:  Yes.  Smoking:  Previous.  Previous Surgery:  Resection of thrombosed left popliteal artery  aneurysm with revision of left fem-pop bypass graft to a fem-tibial  bypass graft on 06/22/2006.   SINGLE LEVEL ARTERIAL EXAM                               RIGHT              LEFT  Brachial:                    137                138  Anterior tibial:             150                75  Posterior tibial:            123                130  Peroneal:  Ankle/brachial index:        1.09               0.94   PREVIOUS ABI:  Date: 02/08/2009  RIGHT:  1.17  LEFT:  0.78   LOWER EXTREMITY BYPASS GRAFT DUPLEX EXAM:   DUPLEX:  1. Biphasic/monophasic Doppler waveforms noted throughout the left leg      bypass graft with no increase in velocity noted.  2. Limited visualization of the proximal and distal thigh level bypass      graft due to graft depth.   IMPRESSION:  1. Patent left femoral to tibial artery bypass graft with no evidence      of stenosis.  2. Mild increase in the left ankle brachial index with a normal right      ankle brachial index noted.   ___________________________________________  V. Charlena Cross, MD   CH/MEDQ  D:  10/01/2009  T:  10/01/2009  Job:  [id number]

## 2010-08-06 NOTE — Procedures (Signed)
BYPASS GRAFT EVALUATION   INDICATION:  Follow-up evaluation of lower extremity bypass graft.   HISTORY:  Diabetes:  Yes.  Cardiac:  Coronary artery bypass graft and MI.  Hypertension:  Yes.  Smoking:  Quit in 1996.  Previous Surgery:  Repair and graft of thrombosed left popliteal artery  aneurysm and tibial artery thrombectomy and conversion of left fem-pop  bypass graft to left fem-to-tibioperoneal bypass graft on 06/22/06 by  Dr. Madilyn Fireman.   SINGLE LEVEL ARTERIAL EXAM                               RIGHT              LEFT  Brachial:                    125                138  Anterior tibial:             156                67  Posterior tibial:            108                96  Peroneal:  Ankle/brachial index:        >1.0               0.70   PREVIOUS ABI:  Date: 04/22/07  RIGHT:  >1.0  LEFT:  >1.0   LOWER EXTREMITY BYPASS GRAFT DUPLEX EXAM:   DUPLEX:  Doppler arterial waveforms are triphasic proximal to, within,  and distal to the bypass graft.  Doppler arterial waveforms are  monophasic at the ankle.   IMPRESSION:  1. Left ankle brachial index is decreased since the previous study.  2. Patent left femoral-to-tibioperoneal trunk bypass graft.  3. Doppler waveform analysis suggest progression of occlusive disease      in the native arteries distal to the graft.   ___________________________________________  P. Liliane Bade, M.D.   MC/MEDQ  D:  07/15/2007  T:  07/15/2007  Job:  [id number]

## 2010-08-06 NOTE — H&P (Signed)
[name], [name]               ACCOUNT NO.:  [id number]   MEDICAL RECORD NO.:  [id number]          PATIENT TYPE:  INP   LOCATION:  3731                         FACILITY:  [facility]   PHYSICIAN:  Gordy Savers, MDDATE OF BIRTH:  02/12/1941   DATE OF ADMISSION:  10/23/2007  DATE OF DISCHARGE:                              HISTORY & PHYSICAL   CHIEF COMPLAINT:  Left lower leg pain.   HISTORY OF PRESENT ILLNESS:  The patient is a 70 year old white  gentleman with a history of coronary artery disease and extensive  peripheral vascular occlusive disease.  He states that he has had  intermittent pain involving his left leg.  It has intensified over the  past couple of days.  He has noted increasing left calf pain with  swelling.  Due to the intensity of pain, he presented to the ED for  evaluation.  He also stated early in the day, the patient had an episode  of substernal chest pain.  The patient was evaluated in the ED the  initial cardiac markers were negative.  EKG revealed no acute findings.  The patient was admitted to the hospital in March 2008, for left leg  pain and ischemia.  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  He has  had conversion of a left femoral-popliteal to a left femoral tibial  perineal bypass in March 2008 by Dr. Madilyn Fireman.  In January 2009, ABIs  bilaterally were greater than 1.  In April 2009, his left ABI was 0.7.  The patient states that an INR was checked approximately 2 weeks ago,  and was supratherapeutic.  He presently has been on a regimen of  Coumadin 7.5 mg 5 days weekly, and 5 mg 2 days weekly.  INR in the ED  setting was 1.5.  The patient is now admitted for further evaluation and  treatment of his left lower leg pain and to rule out deep vein  thrombosis and less likely arterial insufficiency.   PAST MEDICAL HISTORY:  The patient has a history of coronary artery  disease  and cerebrovascular disease.  His multiple diagnoses include  COPD, obstructive sleep apnea, peripheral vascular occlusive disease,  and history of congestive heart failure.  He has history of anxiety,  depression, gastroesophageal reflux disease and morbid obesity.  He has  dyslipidemia, hypertension, and type 2 diabetes.  Additionally, he has a  history of BPH and restless leg syndrome.   MEDICAL REGIMEN:  1. Isosorbide mononitrate 30 mg daily.  2. Lipitor 80 mg daily.  3. Metoprolol 12.5 mg daily.  4. Nitroglycerin p.r.n.  5. Ramipril 2.5 mg daily.  6. Torsemide 60 mg b.i.d.  7. Coumadin 7.5 mg 5 times weekly and 5 mg 2 times weekly.  8. Aspirin 81 mg daily.  9. Potassium chloride 40 mEq b.i.d.  10.Xanax p.r.n.  11.Tylenol No. 3 p.r.n.   REVIEW OF SYSTEM:  The patient is on chronic home O2 and also has home  nebulizer treatments.  He is on nocturnal CPAP which he uses  infrequently.  Review of systems exam is otherwise negative or  noncontributory.   PHYSICAL EXAMINATION:  GENERAL:  An elderly gentleman who is morbidly  obese.  He is comfortable at rest.  HEENT:  Head and neck reveals normal pupil responses.  Conjunctiva  clear.  ENT unremarkable.  Neck revealed no neck vein distention.  A  surgical scar was present involving the left anterior neck region.  No  bruits were noted.  CHEST:  Clear anterolaterally.  CARDIOVASCULAR:  Normal S1 and S2.  ABDOMEN:  Morbidly obese, soft, and nontender.  EXTREMITIES:  A surgical scar involving his left upper arm.  Surgical  scar was also noted in the left groin region and extensive scarring  involving his left leg.  The patient had considerable stasis dermatitis  involving his calf region.  This area was swollen and tender to touch.  There is very little left foot or ankle edema.  Dorsalis pedis pulses  were intact bilaterally.  Posterior tibial pulses were nonpalpable.   IMPRESSION:  Left leg pain appears more likely related to  chronic venous  insufficiency and edema and possibly a DVT, Dopplers represent arterial  insufficiency.  Additional diagnoses peripheral vascular occlusive  disease, coronary artery disease, history of chest pain, and diabetes  mellitus.   DISPOSITION:  We will admit the patient to the hospital and cycle some  enzymes.  We will place on full heparinization and continue Coumadin via  pharmacy protocol.  We will obtain arterial and venous Doppler  examinations.      Gordy Savers, MD  Electronically Signed     PFK/MEDQ  D:  10/24/2007  T:  10/24/2007  Job:  [id number]

## 2010-08-06 NOTE — H&P (Signed)
[name], [name]               ACCOUNT NO.:  [id number]   MEDICAL RECORD NO.:  [id number]          PATIENT TYPE:  EMS   LOCATION:  MAJO                         FACILITY:  [facility]   PHYSICIAN:  Michiel Cowboy, MDDATE OF BIRTH:  07-29-1940   DATE OF ADMISSION:  09/28/2008  DATE OF DISCHARGE:                              HISTORY & PHYSICAL   PRIMARY CARE Josejuan Hoaglin:  Arta Silence, M.D. with [facility].   CHIEF COMPLAINT:  Elevated blood sugars, chest pain.   The patient is a 70 year old gentleman with past medical history  significant for coronary artery disease, CHF, diabetes.  For the past  few days, he had been having elevated blood sugars above 500.  He  actually was seen by his cardiologist today, who recommended him coming  into the emergency department because his blood sugars were about 600.  He does endorse some occasional mild chest pain but states that this is  not reminiscent of his angina.  He had been feeling somewhat dehydrated  with dry mouth and headache.  He also endorses shortness of breath,  which is around his baseline, with minimal exertion.  Lower extremity  edema has actually seemed to have decreased somewhat lately.  Otherwise  no fevers no chills.  No abdominal pain.  No nausea, no vomiting.   REVIEW OF SYSTEMS:  Otherwise negative.   PAST MEDICAL HISTORY:  1. Significant for coronary artery disease, status post CABG in 1990s.      He reports a massive MI 2 years ago.  2. Heart failure with EF of 35%.  3. Left lower extremity chronic edema with venous insufficiency and      also peripheral vascular disease.  4. COPD.  5. Anxiety, depression.  6. Diabetes type 2.  7. Benign prostatic hypertrophy.  8. Dyslipidemia.  9. History of restless leg syndrome.  10.History of DVT, remote, but he is taking Coumadin for this.   SOCIAL HISTORY:  The patient used to smoke but quit in the 90s.  Had a  total 40-pack-year smoking history.  Does not drink, does  not abuse  drugs.   FAMILY HISTORY:  Noncontributory.   ALLERGIES:  VALIUM.   MEDICATIONS:  1. Potassium 20 mEq 3 times a day.  2. Torsemide 60 mg twice daily.  3. Cyclobenzaprine 10 mg 3 times days as needed.  4. Lipitor 80 mg daily.  5. Coumadin.  He is unclear of his dosing but thinks he is taking 5 mg      on Monday, Wednesday, Friday and Saturday and 7.5 mg other days,      although recently, he was instructed to hold it.  Dr. Patty Sermons      manages his Coumadin.  6. Isosorbide mononitrate 30 mg daily.  7. Tylenol w/Codeine #3 as needed.  8. Aspirin 81 mg daily.  9. Sertraline 50 mg daily.  10.Metoprolol 12.5 mg daily.  11.Insulin 75/25 90 units twice a day.   PHYSICAL EXAMINATION:  VITALS:  Temperature 97.8, blood pressure 102/72.  Respirations 27 and 94% room air.  The patient appears to be in no acute distress.  HEAD:  Nontraumatic.  Dry mucous membranes.  Decreased skin turgor.  LUNGS:  No wheezes noted but distant breath sounds throughout.  LOWER EXTREMITIES:  Without edema with chronic discoloration on the  left, consistent with venous stasis disease.  NEUROLOGIC:  The patient is intact.   LABS:  White blood cell count 9.9, hemoglobin 13.5.  Sodium 129,  potassium 3.8, creatinine 1.21.  Cardiac enzymes negative.  UA showing  more than 1000 glucose but negative ketones.  INR 4.7.   CHEST X-RAY:  Negative.   EKG showing V2-V3 ST depression of 1 mm and Q-waves in inferior leads,  which are old.   ASSESSMENT AND PLAN:  This is a 70 year old gentleman with severe  uncontrolled diabetes, history of cardiac disease with chest pain which  is very atypical.  1. Diabetes type 2, uncontrolled.  Will put on clear, and a continue      to follow.  Check TSH.  Check hemoglobin A1c.  2. History of coronary artery disease with chest pain.  Will cycle      cardiac enzymes, serial EKGs.  3. History of hyperlipidemia. Continue Lipitor.  4. History of deep venous thrombosis.   Continue Coumadin.  Will hold      tonight's dose, as he is supratherapeutic.  5. Low sodium, likely secondary to dehydration secondary to      hyperglycemia.  Will give IV fluids and follow.  6. CH - hold torsemide follow bnp. appears currently dehydrated  7. Prophylaxis:  Protonix, plus he is already on Coumadin.      Michiel Cowboy, MD  Electronically Signed     AVD/MEDQ  D:  09/29/2008  T:  09/29/2008  Job:  [id number]   cc:   Arta Silence, MD

## 2010-08-06 NOTE — Letter (Signed)
January 08, 2010   Paula Compton, MD  [address]  [place], [place] [address]   Re:  MILAM, [name]               DOB:  [birth year]   Dear Dr. Mauricio Po:   I saw the patient back today after drainage of the sternoclavicular  wound.  His blood pressure was 138/77, pulse 100, respirations 20.  I  removed his packing from his drainage site, and it should heal without a  problem.  He is doing much better, and I will see him back in 2 weeks  for a final check.   Ines Bloomer, M.D.  Electronically Signed   DPB/MEDQ  D:  01/08/2010  T:  01/09/2010  Job:  [id number]

## 2010-08-06 NOTE — Procedures (Signed)
BYPASS GRAFT EVALUATION   INDICATION:  Followup left lower extremity bypass graft.   HISTORY:  Diabetes:  Yes.  Cardiac:  CABG/MI.  Hypertension:  Yes.  Smoking:  Previous.  Previous Surgery:  Repair of thrombosed left popliteal aneurysm with  tibial artery thrombectomy and conversion of left fem-pop bypass graft  to a fem-tibioperoneal bypass graft on 06/22/2006.   SINGLE LEVEL ARTERIAL EXAM                               RIGHT              LEFT  Brachial:                    132                134  Anterior tibial:             140                81  Posterior tibial:            142                75  Peroneal:  Ankle/brachial index:        1.06               0.6   PREVIOUS ABI:  Date:  07/15/2007  RIGHT:  >1.0  LEFT:  0.7   LOWER EXTREMITY BYPASS GRAFT DUPLEX EXAM:   DUPLEX:  Triphasic Doppler waveforms noted throughout the left lower  extremity bypass graft in its native vessels with no increase in Doppler  velocities noted.  Monophasic Doppler waveforms noted at the left ankle level.   IMPRESSION:  1. Patent left femoral to tibioperoneal artery bypass graft with no      evidence of stenosis.  2. Stable bilateral ankle brachial indices.   ___________________________________________  P. Liliane Bade, M.D.   CH/MEDQ  D:  12/30/2007  T:  12/30/2007  Job:  [id number]

## 2010-08-06 NOTE — Procedures (Signed)
BYPASS GRAFT EVALUATION   INDICATION:  Left fem-pop to fem-tibial peroneal bypass graft  06/12/2006.   HISTORY:  Diabetes:  Yes.  Cardiac:  CABG, MI.  Hypertension:  Yes.  Smoking:  Previous.  Previous Surgery:  Repair of thrombosed left popliteal aneurysm with  tibial artery thrombectomy and conversion on the left fem-pop to fem-  tibial peroneal trunk 06/12/2006.   SINGLE LEVEL ARTERIAL EXAM                               RIGHT              LEFT  Brachial:                    126                115  Anterior tibial:             146                84  Posterior tibial:            148                98  Peroneal:  Ankle/brachial index:        1.17               0.78   PREVIOUS ABI:  Date:  07/06/2008  RIGHT:  1.28  LEFT:  0.6   LOWER EXTREMITY BYPASS GRAFT DUPLEX EXAM:   DUPLEX:  Biphasic Doppler waveforms throughout bypass graft in native  arteries.   IMPRESSION:  1. Patent bypass graft with no evidence of stenosis.  2. Left ankle brachial index suggests mild to moderate disease.           ___________________________________________  V. Charlena Cross, MD   CJ/MEDQ  D:  02/08/2009  T:  02/08/2009  Job:  [id number]

## 2010-08-06 NOTE — Assessment & Plan Note (Signed)
[facility]   NAME:  [name], [name] NO.:  [id number]   MEDICAL RECORD NO.:  [id number]      DATE OF BIRTH:  June 27, 1940   PHYSICIAN:  Maxwell Caul, M.D. VISIT DATE:  11/02/2006                                   OFFICE VISIT   PURPOSE OF TODAY'S VISIT:  [name] returns for followup of his  venous stasis ulcer.  In the meantime he has had to remove the Unna wrap  because of pain in the left leg.  He has not noted any fever or malodor.   ON EXAMINATION:  VITAL SIGNS:  Temperature 97, a pulse 114, blood  pressure 133/74, blood glucose 212.  The actual wound itself appears to be well granulated without  significant drainage.  However, the peri wound area measuring perhaps  1.5 inches in diameter in all directions is quite erythematous and with  several areas that look like there is further skin breakdown.  It is a  bit difficult to determine various degrees of red; however, both the  patient's wife, the patient, and the nurses here sure me that the degree  of erythema surrounding the wound is a new finding.  The area itself is  not particularly warm and the tenderness is a bit difficult to be  certain of because I believe the patient has significant neuropathy.  He  actually is complaining of pain mostly in his ankle and there is some  tenderness over the medial malleolus which leads me to believe that the  pain actually is an ankle problem.  He has had previous trauma in this  area.   IMPRESSION:  1. Cellulitis of the leg.  I have put him on Augmentin 875 p.o. b.i.d.      for 10 days and Bactroban 2% b.i.d. to the wound.  I am not going      to wrap this other than Ace wrap this which the patient also do      himself daily, and we will see him again in 2 days' time.  2. Stasis ulceration of the left leg.  The wound itself actually does      not look too bad.  Certainly there is nothing to culture here and      nothing needed to be  debrided.  We will apply Bactroban to the      actual wound itself.  And again follow him in 2 days.   Of note: The patient was listed as being on ciprofloxacin on his initial  visit here.  He is not aware of that being on this.  I will have the to  have him call back and speak to the nurses with regards to what he is  actually on.  Further he describes placing some form of salve on the  wound.  This was not prescribed by [place] and we will also see if we can  figure that out by phone later today.  Otherwise he is to come back and  see [place] on Wednesday, in 2 days time.           ______________________________  Maxwell Caul, M.D.     MGR/MEDQ  D:  11/02/2006  T:  11/02/2006  Job:  [id number] 

## 2010-08-09 NOTE — Discharge Summary (Signed)
NAME:  [name], [name]                         ACCOUNT NO.:  [id number]   MEDICAL RECORD NO.:  [id number]                   PATIENT TYPE:  INP   LOCATION:  3707                                 FACILITY:  [facility]   PHYSICIAN:  Leighton Roach McDiarmid, M.D.             DATE OF BIRTH:  May 29, 1940   DATE OF ADMISSION:  07/12/2003  DATE OF DISCHARGE:  07/13/2003                                 DISCHARGE SUMMARY   ATTENDING:  Leighton Roach McDiarmid, M.D.   INTERN:  Kerby Nora, M.D.   PRIMARY CARE DOCTOR:  Dr. Toney Rakes at [facility].   DISCHARGE DIAGNOSES:  1. Dehydration secondary to over-diuresis.  2. Orthostatic hypotension.  3. Overflow incontinence.  4. Acute renal failure secondary to dehydration.  5. Hypercholesterolemia.  6. Coronary artery disease.  7. Congestive heart failure.  8. Chronic obstructive pulmonary disease.  9. Gastroesophageal reflux disease.  10.      Diabetes mellitus type 2.  11.      Anxiety and depression.   DISCHARGE MEDICATIONS:  1. Advair Diskus one puff b.i.d.  2. Albuterol two puffs q.4h. p.r.n. shortness of breath.  3. Altace 5 mg p.o. daily.  4. Atrovent nebulizers inhaled q.6h.  5. Effexor 75 mg p.o. b.i.d.  6. Glucophage 500 mg p.o. daily.  7. Imdur 60 mg daily.  8. K-Dur 20 mEq p.o. daily.  9. Aspirin 325 mg p.o. daily.  10.      Aldactone 100 mg p.o. daily.  11.      Lipitor 40 mg p.o. daily.  12.      Metoprolol 50 mg p.o. b.i.d.  13.      Prevacid 40 mg p.o. daily.  14.      Home oxygen p.r.n. shortness of breath.   FOLLOW-UP APPOINTMENTS:  Follow up with Dr. Toney Rakes on Thursday, July 20, 2003 at 2:40 p.m.  The patient is to call earlier for appointment if he  feels like he is getting fluid overloaded or has shortness of breath above  baseline.   SPECIAL INSTRUCTIONS TO THE PATIENT:  1. Please eat a heart-healthy low salt, low carbohydrate diet.  2. The patient has been instructed to hold Lasix and hold Flomax.   INSTRUCTIONS TO PRIMARY CAREGIVER:  1. We felt that the patient was over-diuresed and therefore we are holding     Lasix.  This may be restarted slowly or back at its lower dose as needed     when the patient is no longer with lower volume status.  2. The patient did initially have evidence of prolonged QT syndrome on     initial EKG but the repeat EKG showed the same manually-measured QT     length but the computer recorded a more normal QT length.  We think that     the initial QT reading was abnormal and that the patient does not have QT  syndrome, but please be alert at the next visit if the patient has any     further symptoms.   BRIEF ADMISSION HISTORY AND PHYSICAL:  The patient is a 70 year old male  with extensive coronary artery disease history who presented to the  emergency department with dizziness and lightheadedness after being at  cardiac rehab.  The patient had not begun cardiac rehab when he had the  symptoms.  He reports taking a double dose o Lasix up to 160 mg p.o. b.i.d.  for Friday and Saturday prior to admission.  Another newly started medicine  for the patient was Flomax.  Of note, at cardiac rehab the patient was found  to be hypotensive to systolics of 60s and was very orthostatic.   HOSPITAL COURSE BY PROBLEM LIST:  #1 - DEHYDRATION SECONDARY TO OVER-  DIURESIS.  We feel that the patient's symptoms of dizziness and orthostatic  hypotension were secondary to the increased double dose of Lasix on Friday  and Saturday in addition to Flomax which can sometimes cause orthostatic  hypotension.  The patient became mildly dehydrated.  This was also supported  by the patient's urine specific gravity and elevated creatinine.  The  patient was hydrated with IV fluids, his systolic blood pressure returned to  the 120s, and the patient was no longer symptomatic.  On discharge the  patient was restarted on Aldactone but his Lasix dose was held.  This should  be restarted  by the patient's primary care doctor.   #2 - ORTHOSTATIC HYPOTENSION.  Per #1.  Also, the patient was ambulated in  the hallway before discharge and he maintained a blood pressure between 109-  129 over 63-81 with a pulse between 70 and 84, O2 saturations between 93%  and 97% on room air.  The patient was asymptomatic at discharge.   #3 - ACUTE RENAL FAILURE SECONDARY TO DEHYDRATION.  On admission the  patient's creatinine had elevated from a baseline of 1.1 to 1.7.  Following  hydration with IV fluids his creatinine returned to 1.1 by hospital day #2.  The patient never had any problems with urine output.   #4 - OVERFLOW INCONTINENCE.  The patient's description of his symptoms of  incontinence appear to be most consistent with overflow incontinence.  Given  the fact that the Flomax may have been related to the patient's orthostatic  hypotension, we will hold the patient's Flomax for now given the patient's  mild symptoms.  This may be restarted as an outpatient if needed.   #5 - HYPERCHOLESTEROLEMIA.  Stable during hospital stay on Lipitor.   #6  - CORONARY ARTERY DISEASE.  Stable during hospital stay on Altace,  Imdur, aspirin, K-Dur, and metoprolol.   #7 - CONGESTIVE HEART FAILURE.  Stable during hospital stay.   #8 - CHRONIC OBSTRUCTIVE PULMONARY DISEASE.  Stable during hospital stay.  All respiratory medicines were continued through hospitalization.   #9 - GASTROESOPHAGEAL REFLUX DISEASE.  The patient was continued on  Prevacid.   #10 - DIABETES MELLITUS TYPE 2.  On admission the patient's blood glucose  was found to be 127, under good control on his Glucophage.   #11 - ANXIETY AND DEPRESSION.  Stable throughout hospital stay.      Kerby Nora, MD                           Etta Grandchild, M.D.    AB/MEDQ  D:  07/13/2003  T:  07/15/2003  Job:  [id number]   cc:   Maurice March, M.D. [facility]. Med - Resident - [place], [place] [address]  Fax: [phone]

## 2010-08-09 NOTE — Assessment & Plan Note (Signed)
[facility]                             PULMONARY OFFICE NOTE   [name], [name]                      MRN:          [id number]  DATE:02/19/2006                            DOB:          [birth year]    I met [name] today for evaluation of his sleep apnea and COPD.   He had originally undergone an overnight polysomnogram which was a split-  night study on Aug 16, 2002.  During the diagnostic portion of the study  he was found to have an apnea/hypopnea index of 22 and oxygen saturation  nadir of 27%.  He was subsequently titrated to a CPAP pressure setting  of 7 with a reduction his apnea/hypopnea index to 0.  However, he did  not have REM sleep during this portion of the test.   He was subsequently initiated on CPAP therapy with a humidifier, which  from what I can tell was a cold-air humidifier, and a nasal mask.  He  says he has not had his mask changed for the last 2 years.  He has been  having quite a bit of difficulty as far as getting used to using the  mask.  He says currently what happens is that he will go to bed at  around 11 p.m., he tries putting the mask on but can only last about 5-  20 minutes with it on before he gets very anxious and has to take it  off.  He says what really happens is that he feels like he cannot  breathe, and as a result he takes the mask off.  He then will fall  asleep but wakes up about every 2 hours to use the bathroom.  He will  get up at about 8 a.m. but he says he is not really that tired, although  I get the impression that he is underestimating this to some degree.  On  further questioning, it appears what his main problem is that he tends  to breathe more through his mouth and when he puts his nasal mask on he  has a significant amount of air leaking out of his mouth.  Additionally,  I had asked him to try putting his mask on in my office today and he had  quite a bit of difficulty even just placing  the mask on.  He does still  snore.  He will wake up frequently with a coughing sensation and again,  he tends to breathe more through his mouth.  He has also been having  vivid dreams.  He is not currently using anything to help him fall  asleep at night or stay awake during the day, although he is on Xanax  for anxiety and oxycodone for hip and knee pains, as well as Lexapro for  anxiety.   He says that he is currently using his Advair inhaler twice a day, as  well as his DuoNeb nebulizer twice a day.  He had used Combivent before  but has not been on any other inhaler regimen, apparently, besides this.  He gets short of breath  with minimum exertion and he does have frequent  coughing as well as chest tightness with occasional wheezing, and he has  cough productive of clear sputum.  He has had his influenza vaccination  this year and he had a pneumococcal vaccination recently.   He also complains of having an odd feeling in his legs and feeling like  he has to keep moving his legs.  This is mostly in his left leg in which  he had a DVT previously, although it usually comes on around 8 p.m. or 9  p.m., but does not seem to cause him too much difficulty falling asleep.  He denies any symptoms of sleep hallucinations, sleep paralysis, or  cataplexy.  He does complain of some soreness when he is swallowing,  particularly over the last 1-2 weeks.   PAST MEDICAL HISTORY:  Otherwise is significant for:  1. Coronary artery disease status post myocardial infarction, status      post coronary artery bypass grafting with congestive heart failure      and an ejection fraction of approximately 40-45%.  2. COPD.  3. DVT and pulmonary embolus.  4. Chronic venous stasis in his left leg.  5. Anxiety.  6. Carpal tunnel syndrome.  7. Diabetes.  8. Elevated cholesterol.  9. Sleep apnea.   He has also had cervical fusion, surgery on his right shoulder, right  elbow, and left hip, as well as carpal  tunnel release.   CURRENT MEDICATIONS:  1. Lexapro 10 mg daily.  2. Lipitor 80 mg daily.  3. Torsemide 60 mg t.i.d.  4. DuoNeb q.i.d. p.r.n.  5. Advair 250/50 one puff b.i.d.  6. Coumadin 5 mg as directed.  7. Colace 100 mg b.i.d.  8. Altace 2.5 mg daily.  9. Isosorbide 30 mg daily.  10.Aspirin 81 mg daily.  11.Metoprolol 12.5 mg b.i.d.  12.Humalog 70/30, 35 units in the morning and 35 units at night.  13.Gas-X t.i.d.  14.Zantac 75 mg daily p.r.n.  15.Xanax 0.5 mg b.i.d.  16.Oxycodone ER 20 mg b.i.d.   He has an allergy to VALIUM, which he says causes him to get confused.   SOCIAL HISTORY:  He is married.  He is disabled.  He used to work in  [employer].  He quit smoking 4-5 years ago, started in his early 9s.  He used to smoke a pack of cigarettes per day.  He denies significant  alcohol use.   FAMILY HISTORY:  Significant for his mother who died in a motor vehicle  accident, father had myocardial infarction.  He has one sister with  sleep apnea.   REVIEW OF SYSTEMS:  Essentially negative except for what is stated  above.   PHYSICAL EXAMINATION:  VITAL SIGNS:  He is 5 feet 8 inches tall, 275  pounds.  Temperature is 98.8, blood pressure is 118/70, heart rate is  99, oxygen saturation 94% on room air.  HEENT:  Pupils reactive.  There is no sinus tenderness.  He has mild  nasal deviation.  He has a Mallampati 3 airway with erythema in his  posterior pharynx with a whitish exudate.  There is no lymphadenopathy,  no thyromegaly.  HEART:  S1, S2.  CHEST:  He has decreased breath sounds but no wheezing or rales.  ABDOMEN:  Obese, soft, nontender.  EXTREMITIES:  He has chronic venous stasis changes in his left leg with  minimal ankle edema.  NEUROLOGIC:  He appears quite anxious but there are no other focal  deficits appreciated.  Chest x-ray from June 19, 2004, was reviewed and showed cardiomegaly  with post surgical changes.  IMPRESSION:  1. Obstructive sleep  apnea as demonstrated by a previous polysomnogram      done in March 2004 which showed an apnea/hypopnea index of 22 and      oxygen saturation nadir of 77%.  He appears to be having quite a      bit of difficulty with regards to the use of his CPAP machine.  I      think part of this, again, is related to his difficulty with his      mask and in fact he may be better suited with a full face mask      since he tends to be more of a mouth breather.  The other thing I      would be concerned about is if he is on the appropriate pressure      setting or, in fact, if he may need to have a different type of      machine, given the fact that he does have congestive heart failure      with systolic dysfunction, as well as use of narcotics in addition      to possibly having obesity hypoventilation syndrome, in which case      he may be better suited for BiPAP.  To further address this I have      made arrangements for him to have a repeat titration study during      which time these issues can be further addressed.  In the meantime,      I have discussed with him the importance of diet, exercise and      weight reduction, as well as the avoidance of alcohol and      sedatives.  2. Chronic obstructive pulmonary disease.  I will have him undergo      pulmonary function tests as well as an arterial blood gas analysis      to determine his baseline oxygenation, as well as to determine if      he is having some degree of hypoventilation with the elevation in      his carbon dioxide level.  In the meantime I would continue him on      Advair 250/50 one puff b.i.d.  I will switch him to Spiriva one      puff daily and Ventolin HFA two puffs q.i.d. p.r.n., and have him      discontinue his DuoNeb nebulizer.  3. Thrush.  I believe this is probably related to the use of his      Advair. I have given him a prescription for a nystatin swish and      swallow 5 mL q.i.d. for 5 days.  4. Hypoxemia.  He is to  continue on his supplemental oxygen and this      will be further addressed after review of the results of his      pulmonary function tests, arterial blood gas, and overnight      polysomnogram.  5. Possible restless legs syndrome with periodic limb movements seen      on his previous overnight polysomnogram.  I would like to have him      stabilized on his therapy for his sleep apnea and then readdress      this issue to determine if any further interventions would be      needed.  6. History of deep venous thrombosis and  pulmonary embolus.  He is      currently on Coumadin therapy.   I will follow up with him in approximately 1 month.     Coralyn Helling, MD  Electronically Signed    VS/MedQ  DD: 02/19/2006  DT: 02/20/2006  Job #: [id number]   cc:   Arta Silence, MD  Cassell Clement, M.D.

## 2010-08-09 NOTE — Consult Note (Signed)
NAMECHRISTOHER, [name] NO.:  [id number]   MEDICAL RECORD NO.:  [id number]          PATIENT TYPE:  INP   LOCATION:  2927                         FACILITY:  [facility]   PHYSICIAN:  Georga Hacking, M.D.DATE OF BIRTH:  05-Nov-1940   DATE OF CONSULTATION:  DATE OF DISCHARGE:                                   CONSULTATION   CONSULTING PHYSICIAN:  Georga Hacking, M.D.   TIME OF CRITICAL CARE:  Is 4:30 to 5:30 a.m.   This 70 year old male has a complex history with previous bypass grafting  and had a complex interventional procedure by Dr. Elease Hashimoto, in February 2002,  with stenting of the right coronary artery with later closure, complicated  by an acute infarction requiring multiple stents.  He has since that time  had multiple admissions for COPD, dyspnea, cellulitis, and had probably a  small infarction at some point in the future also.  He was awake this  morning, had just eaten [place] fries, and had the onset of coughing and then  midsternal chest discomfort.  He was brought by EMS to the emergency room  where he was found to have some new ST elevation in the inferior leads with  ST depression in the anterior leads and was somewhat tachycardic.  The cath  lab team was called in and I was called in to see the patient.  When I saw  the patient, he was coughing, was having some chest pain.  He had been on  Coumadin therapy and intravenous access was difficult.  He was brought to  the cath lab but no chest x-ray had been done.  A chest x-ray was performed  that did not show acute disease.  We delayed the start of the  catheterization somewhat and we were taking care of the patient.  He had  severe coughing and we had to re drape him because he contaminated the  field.  He was still having some mild chest discomfort.  Initial cardiac  enzymes were negative but subsequent cardiac enzymes CK-MB was slightly  elevated.  I elected to go ahead with the catheterization on him  in light of  the fact that he had chest discomfort with new ST elevation, although it was  unlikely that the vein graft to the right coronary artery would be good to  intervene on.   PHYSICAL EXAMINATION:  GENERAL:  The patient was an obese male, weighing 305  pounds.  VITAL SIGNS:  His blood pressure was 110/70.  His pulse was 110.  SKIN:  Warm and dry.  He had previous venous insufficiency to the lower  extremities.  He had multiple tattoos noted.  LUNGS:  Showed some wheezing bilaterally.  CARDIOVASCULAR:  Showed normal S1 and S2.  There was no murmur.  ABDOMEN:  Soft, large and obese.  Femoral pulses are 2+.  Distal pulses are  2_.  EXTREMITIES:  There were saphenous vein harvesting scars noted bilaterally.  NEUROLOGIC:  Normal.   His EKG showed ST depression in the anterior leads, tachycardia.  Chest x-  ray showed no acute disease.  Pro time/INR was 3.2.   IMPRESSION:  1.  Suspected acute inferior infarction versus chronic changes.  2.  Severe chronic obstructive pulmonary disease.  3.  Previous saphenous vein graft intervention in 2002.  4.  Severe morbid obesity.  5.  History of peripheral vascular disease with popliteal tibial bypass.   RECOMMENDATIONS:  The patient is critically ill.  He is taken to the cardiac  cath lab and we will determine whether what his anatomy is, whether he has  anything that would be suitable for intervention.  On review of the old cath  films, his LAD was widely patent before and the catheterization procedure  was discussed extensively with the patient and wife including risk of death,  stroke, or heart attack, and he understands this and is willing to proceed.      Georga Hacking, M.D.  Electronically Signed     WST/MEDQ  D:  03/17/2005  T:  03/17/2005  Job:  [id number]   cc:   Cassell Clement, M.D.  Fax: [phone]

## 2010-08-09 NOTE — Op Note (Signed)
NAME:  [name], [name]                         ACCOUNT NO.:  [id number]   MEDICAL RECORD NO.:  [id number]                   PATIENT TYPE:  AMB   LOCATION:  ENDO                                 FACILITY:  [facility]   PHYSICIAN:  Charna Elizabeth, M.D.                   DATE OF BIRTH:  Jul 26, 1940   DATE OF PROCEDURE:  DATE OF DISCHARGE:  11/04/2001                                 OPERATIVE REPORT   PROCEDURE PERFORMED:  Esophagogastroduodenoscopy.   ENDOSCOPIST:  Charna Elizabeth, M.D.   INSTRUMENT USED:  Olympus video panendoscope.   INDICATIONS:  Dysphagia in a 70 year old white male patient. The patient had  a barium swallow done which showed mild distal esophageal spasm with minimal  reflux.   PREPROCEDURE PREPARATION:  Informed consent was procured from the patient  and the patient fasted 8 hours prior to the procedure.   PREPROCEDURE PHYSICAL EXAMINATION:  VITAL SIGNS:  The patient had stable  vital signs.  HEENT:  Neck was supple.  ABDOMEN:  Focal findings pertinent to the abdomen - soft with normal bowel  sounds.   DESCRIPTION OF PROCEDURE:  The patient was placed in the left lateral  decubitus position and sedated with 60 mg of Demerol and 8 mg of Versed  intravenously. Once the patient was adequately sedated and maintained on low  flow oxygen and continuous cardiac monitoring, the Olympus video  panendoscope was advanced through the mouth piece, over the tongue and into  the esophagus under direct vision. There was evidence of mild distal  esophagitis. The proximal esophagus appeared normal. No definite stricture  was seen. The scope passed easily through the distal esophagus into the  stomach. A small polyp was seen in the midbody of the stomach with central  ulceration; this was biopsied for pathology. This was along the lessor  curvature. The rest of the gastric mucosa and the proximal small bowel  appeared normal.   IMPRESSION:  1. Mild distal esophagitis.  2. Small  polyp, sessile in nature, biopsied from midbody of the stomach to     adenomatous change.  3. Normal appearing antrum and proximal small bowel.   RECOMMENDATIONS:  1. The patient has been asked to go to the office to get samples of a PPI so     we can start this for now.  2.     Avoid all nonsteroidals and aspirin are encouraged.  3. Antireflux measures have been advised.  4. Outpatient followup in the next two weeks.                                               Charna Elizabeth, M.D.    JM/MEDQ  D:  11/05/2001  T:  11/08/2001  Job:  [id number]   cc:  Gwenlyn Perking, M.D.

## 2010-08-09 NOTE — Op Note (Signed)
[facility]  Patient:    Todd, [name]                      MRN: [id number] Proc. Date: 01/06/00 Adm. Date:  [id number] Disc. Date: [id number] Attending:  Donalee Citrin P                           Operative Report  PREOPERATIVE DIAGNOSIS:  Right shoulder partial rotator cuff tear and impingement.  POSTOPERATIVE DIAGNOSIS:  Right shoulder partial rotator cuff tear and impingement.  DJD, right shoulder.  PROCEDURE:  Right shoulder EUA followed by arthroscopic debridement.  Right shoulder subacromial decompression.  Right shoulder distal clavicle excision.  SURGEON:  Elana Alm. Thurston Hole, M.D.  ASSISTANT:  Kirstin Adelberger, P.A.  ANESTHESIA:  General anesthesia.  OPERATIVE TIME:  45 minutes.  COMPLICATIONS:  None.  INDICATIONS:  [name] is a 70 year old gentleman who has had a year of increasing right shoulder pain with signs and symptoms and MRI documenting a partial rotator cuff care and DJD who has failed conservative care and is now to undergo arthroscopy.  DESCRIPTION OF PROCEDURE:  [name] is brought to the operating room on January 06, 2000, after a supraclavicular block had been placed in the holding room.  He was placed on the operating table in the supine position.  After an adequate level of general anesthesia was obtained, his right shoulder was examined under anesthesia.  Had full range of motion.  Shoulder with stable ligamentous examination with 1+ crepitation.  After this was done, he was placed in a beach chair position and his shoulder and arm was prepped using sterile Betadine and draped using sterile technique.  After this was done, then through a posterior arthroscopic portal, the arthroscope with the pump attached was placed, and through an anterior portal an arthroscopic probe was placed.  On initial inspection, the articular cartilage and glenohumeral joint showed grade II and III chondromalacia was debrided.   Anterior labrum partial tearing 25 to 30% which was debrided.  Posterior labrum moderate tearing 25 to 30% which was debrided.  Inferior labrum and anterior inferior glenohumeral ligament complex was intact.  Superior labrum and biceps tendon anchor was intact, biceps tendon was intact, rotator cuff showed partial tearing 25 to 30% of the undersurface of the supraspinatous and infraspinatous which was debrided, but a complete tear was not found.  Inferior capsular recess was free of pathology.  Subacromial space was entered, moderately thickened bursitis was debrided, a lateral arthroscopic portal was made.  Underneath this, the rotator cuff, however, was found to be intact.  Subacromial decompression was then carried out removing 6 to 8 mm of the undersurface of the anterior, anterolateral, and anteromedial acromion and then CA ligament release carried out.  AC joint was exposed.  Significant degenerative changes noted in this joint and using a 6 mm bur and another anterior portal, the arthroscopic bur was used to resect the distal 5 mm of the clavicle.  After this was done, no further impingement was noted through a full range of motion.  At this point, it was felt that all of the pathology had been satisfactorily addressed.  The instruments were removed.  Portals were closed with 3-0 nylon suture and injected with 0.25% Marcaine with epinephrine. Sterile dressings and a sling applied.  The patient awakened and taken to the recovery room in stable condition.  FOLLOW-UP:  [name] will be  followed overnight at the recovery care center for IV pain control and neurovascular monitoring.  Discharged tomorrow on Percocet for pain. Seen back in the office in a week for sutures out and follow-up. DD:  01/06/00 TD:  01/06/00 Job: [id number]

## 2010-08-09 NOTE — Consult Note (Signed)
NAME:  [name], [name]                         ACCOUNT NO.:  [id number]   MEDICAL RECORD NO.:  [id number]                   PATIENT TYPE:  INP   LOCATION:  4713                                 FACILITY:  [facility]   PHYSICIAN:  Colleen Can. Deborah Chalk, M.D.            DATE OF BIRTH:  1940-03-27   DATE OF CONSULTATION:  05/17/2002  DATE OF DISCHARGE:                                   CONSULTATION   Thank you very much for asking me to see Mr. Faucett.   HISTORY OF PRESENT ILLNESS:  He is a 70 year old male with an extensive  history of coronary disease, a previous stent, and myocardial infarction.  He presents with increasing congestive heart failure and COPD exacerbation.  He had chest pain tonight which was relieved with nitroglycerin but no new  EKG change.   His has had shortness of breath.  These discomforts have lasted a couple of  minutes in general and have had nitroglycerin bringing about relief.   His past medical history includes prior inferior myocardial infarction in  February of 2002 with emergency stenting of the saphenous vein graft to the  right coronary artery.  He had prior coronary artery bypass grafting in  1992.  There was no re-do bypass surgery in February of 2002 following the  adenosine which showed a large inferior lateral scar with small anterior  reversible ischemia.  Ejection fraction was 46%.  He has had prior tobacco  abuse, chronic Coumadin anticoagulation, chronic back pain,  hypercholesterolemia, COPD, depression.  He has had a history of left hip  replacement x2.   He has had right shoulder surgery, neck surgery, carpal tunnel surgery.   ALLERGIES:  VALIUM.   CURRENT MEDICINES:  1. K-Dur.  2. Imdur 60.  3. Zocor 80.  4. Aspirin.  5. Advair.  6. Combivent.  7. Effexor.  8. Protonix.  9. Lopressor 25 mg t.i.d.  10.      Coumadin.   FAMILY HISTORY:  Father died at age 31 of MI.  Mother died in her 60s after  a car wreck.   SOCIAL HISTORY:   No current alcohol or tobacco abuse.   REVIEW OF SYSTEMS:  Basically as noted above.   PHYSICAL EXAMINATION:  GENERAL:  On exam, he is an obese elderly male,  appears chronically ill.  VITAL SIGNS:  Blood pressure is 120/64.  Heart  rate is in the 80s.  Respiratory rate is 20.  SKIN:  Warm and dry.  NECK:  Supple.  LUNGS:  Coarse.  HEART:  Regular rate and rhythm.  No S3.  ABDOMEN:  Obese.  EXTREMITIES:  2+ edema.   EKG shows sinus and nonspecific ST and T wave changes, old inferior  myocardial infarction, perhaps more ST-segment depression in V2 through V4.   Cardiac enzymes are negative x3.   OVERALL IMPRESSION:  1. Recurrent chest pain.  2. Known atherosclerotic cardiovascular disease with prior  coronary artery     bypass grafting and prior myocardial infarction managed medically.  3. Chronic obstructive pulmonary disease.  4. Hypercholesterolemia.  5. Chronic pain syndrome.   PLAN:  IV nitroglycerin.  Will check labs.  Continue heparin anticoagulation  for now.  Will need to make sure myocardial infarction rules out.  Once he  is stabilized, he should be ready for discharge.                                               Colleen Can. Deborah Chalk, M.D.    SNT/MEDQ  D:  05/18/2002  T:  05/18/2002  Job:  [id number]   cc:   Leighton Roach McDiarmid, M.D.  [address] [place]  [place] [address]  Fax: [phone]

## 2010-08-09 NOTE — Discharge Summary (Signed)
NAME:  [name], [name]                         ACCOUNT NO.:  [id number]   MEDICAL RECORD NO.:  [id number]                   PATIENT TYPE:  INP   LOCATION:  2023                                 FACILITY:  [facility]   PHYSICIAN:  Barney Drain, MD                   DATE OF BIRTH:  [birth year]   DATE OF ADMISSION:  12/01/2001  DATE OF DISCHARGE:  12/02/2001                                 DISCHARGE SUMMARY   ADMISSION DIAGNOSES:  1. Right heart failure.  2. Dyspnea.  3. Left extremity edema.   DISCHARGE DIAGNOSES:  1. Right heart failure.  2. Acute venostasis.   SERVICE:  Conservation officer, historic buildings.   CONSULTATIONS:  None.   PROCEDURE:  None.   HISTORY OF PRESENT ILLNESS:  The patient is a 70 year old male with a  complicated cardiac history of chronic obstructive pulmonary disease, and a  history of deep vein thrombosis and pulmonary embolism who presented to the  [facility] complaining of increased dyspnea x1 week, occasional  cough, and increased severity of lower extremity edema.  The patient also  had increased abdominal girth.  The patient denied chest pain, nausea, or  diaphoresis.  The patient had noted gained weight.  Physical examination was  significant for a dyspneic obese male in moderate distress.  No JVD.  There  were scant crackles in the left base.  The patient was tachypneic.  There  was 3+ bilateral pitting edema in the lower extremities with chronic  venostasis changes.   LABORATORY DATA:  Significant for negative cardiac markers x3.  A low  albumin at 3.1 was also noted.   HOSPITAL COURSE:  The patient was determined to be having a congestive heart  failure exacerbation.  He was heavily diuresed with Lasix.  Potassium was  supplemented.  The metoprolol 25 mg t.i.d. and aspirin were continued.  On  the morning after his admission it was noted that the patient's lower  extremities were more erythematous, left more than right.  There was  also  significant increase in tenderness to palpation.  Positive pallor was noted  on examination.  Doppler of the left lower extremity was obtained which  showed no deep vein thrombosis or superficial thrombus.  The patient was  much improved with surgent diuresis with Lasix.  Orthopnea resolved, and the  patient states that he had much less dyspnea.  There was an improvement in  the lower extremity edema.  It was decided to treat the lower extremity  erythema and tenderness as acute venous stasis.  TED hose were given to the  patient prior to discharge.  As the patient's pulmonary examination was much  improved after diuresis and his subjective complaints have resolved, it was  decided to continue diuresis over the weekend at home with Lasix, and for  the patient to follow up with his primary care physician on Monday.  CONDITION ON DISCHARGE:  Stable.   DISPOSITION:  Discharged to home.   DISCHARGE MEDICATIONS:  1. Lasix 40 mg b.i.d.  2. The patient was to resume all other home medications as written.   INSTRUCTIONS:  The patient was to wear TED hose throughout the day, keep  feet elevated while at rest.   FOLLOWUP:  The patient was given an appointment with his primary care  physician, Dr. Rodman Pickle on Monday, 12/06/01 at 1:30 p.m.                                                 Barney Drain, MD    SS/MEDQ  D:  12/02/2001  T:  12/05/2001  Job:  [id number]   cc:   Misty Stanley A. Rodman Pickle, MD  Fam. Med - Resident - [place], [place] [address]  Fax: [phone]

## 2010-08-09 NOTE — H&P (Signed)
NAMEAYCE, PIETRZYK NO.:  [id number]   MEDICAL RECORD NO.:  [id number]          PATIENT TYPE:  INP   LOCATION:  1823                         FACILITY:  [facility]   PHYSICIAN:  Melina Fiddler, MD DATE OF BIRTH:  [birth year]   DATE OF ADMISSION:  08/06/2004  DATE OF DISCHARGE:                                HISTORY & PHYSICAL   CHIEF COMPLAINT:  Left upper quadrant pain and a racing heart.   HISTORY OF PRESENT ILLNESS:  The patient is a 70 year old gentleman seen by  his home health nurse today. His heart rate during her visit ranged between  112 and the 120s and he is normally in the 60s and he complained of cramping  pain in his arms, legs, and neck. He also has a poorly described right upper  quadrant or left-sided lower chest pain described as a knot that sounded  superficial in nature, i.e. anterior to the ribs and not deep in the  abdomen. He has no associated nausea, vomiting, diarrhea, or constipation.  He does describe it as a bit worse with abduction of his left arm. It is  intermittent in nature. It was not improved with sublingual nitroglycerin  but was a bit better after Vicodin x 1. He did have some associated  shortness of breath but no diaphoresis was described.   REVIEW OF SYMPTOMS:  He has general body aches. No chest pain except as  noted in the HPI. He does have lower extremity edema which is normal for  him. He does complain of some mild shortness of breath but no cough or  wheeze. Denies nausea, vomiting, diarrhea, constipation. Denies hematochezia  or melena. Denies focal neurologic defect. Does complain of muscle cramp.   Dictation ended at this point.      JT/MEDQ  D:  08/06/2004  T:  08/07/2004  Job:  [id number]

## 2010-08-09 NOTE — Discharge Summary (Signed)
[name], [name]               ACCOUNT NO.:  [id number]   MEDICAL RECORD NO.:  [id number]          PATIENT TYPE:  INP   LOCATION:  3712                         FACILITY:  [facility]   PHYSICIAN:  Melina Fiddler, MD DATE OF BIRTH:  1940-11-26   DATE OF ADMISSION:  08/06/2004  DATE OF DISCHARGE:  08/08/2004                                 DISCHARGE SUMMARY   ADMISSION DIAGNOSES:  1.  Left upper quadrant abdominal pain.  2.  Coronary artery disease.  3.  Diabetes mellitus, type 2.  4.  Congestive heart failure.  5.  Chronic obstructive pulmonary disease.  6.  Hypercholesterolemia.  7.  Anxiety.   DISCHARGE MEDICATIONS:  1.  Aldactone 100 mg one tablet p.o. daily.  2.  Aspirin 325 mg one tablet p.o. daily.  3.  Advair 250/50 Diskus one puff inhaled b.i.d.  4.  Combivent inhaler two puffs q.i.d.  5.  Coumadin 7.5 mg one tablet Monday, Wednesday, Friday.  6.  Coumadin 5 mg one tablet Tuesday, Thursday, Saturday, and Sunday.  7.  Demadex 20 mg, take two tablets once a day.  8.  Imdur 60 mg take one tablet p.o. daily.  9.  Lexapro 10 mg one tablet p.o. daily.  10. Lipitor 40 mg one tablet p.o. daily.  11. Metoprolol 50 mg one tablet p.o. b.i.d.  12. Zantac 75 mg one tablet p.o. daily.  13. Albuterol two puffs q.4h. p.r.n. for shortness of breath.  14. Nitroglycerin 0.4 mg one tablet sublingual p.r.n. for chest pain or q.5      minutes up to three tablets; if pain persists, come to the ED.  15. Vicodin 5/500 one tablet p.o. q.6h. p.r.n. for pain.   Medications delayed:  K-Dur, Glucophage, and Altace.   DIET:  Low sodium, low carbohydrate diet.   BRIEF HISTORY:  This is a 70 year old white male who came in complaining of  left upper quadrant pain and palpitations.  The patient had some abdominal  pain, apparently, relieved with sublingual nitroglycerin and Vicodin.  He  was also complaining of shortness of breath.   HOSPITAL COURSE:  Problem 1. Abdominal pain.  On physical  examination on  admission, the patient was tender to palpation in the left upper quadrant  and low costal margin.  There were no masses appreciated.  There was a small  bruised in that area of about 1 cm but no prior history of trauma.  The  patient was on Coumadin.  His liver functions and electrolytes were found to  be within normal limits and a cardiac source was ruled out.  There was a  possible diagnosis of __________ bleeding or hematoma or __________ hematoma  due to the patient being on anticoagulants and aspirin.  CT scan was  performed and it showed diverticulosis, no acute findings, no inflammation  and left hip replacement.  By the day of discharge, the patient's pain was  on the scale of 2/10 and he was noted to be complaining of this kind of pain  for several months treated with Vicodin.  This is a chronic complaint and  all the possibilities were excluded, including bleeding, pancreatitis, or  bleeding problems.  Patient's diagnosis could be musculoskeletal pain.   Problem 2. Coronary artery disease, cerebrovascular accident.  Stable during  hospitalization.  The patient was placed back on aspirin 325 mg p.o. daily  and Coumadin after any source of bleeding was ruled out, and Altace was held  until the patient sees his doctors due to increasing creatinine values after  hospitalization from 1.5 to 1.6.  Patient with a known baseline of 1.3.  This should be addressed as an outpatient.  Also, Glucophage was held due to  the same creatinine increase.  CBGs by the day of discharge were 128-133, so  the patient is going to be diet controlled until his office visit.   Problem 3. Congestive heart failure.  The patient will continue taking the  Aldactone as previously mentioned.  Demadex was decreased from 40 mg b.i.d.  to 40 mg daily due to increased diuresis and increase in creatinine.  He  will continue Demadex and nitroglycerin as needed and metoprolol 50 mg  b.i.d. and Imdur 60  mg daily.  Also, K-Dur 20 mEq p.o. daily was held until  his office visit.  The patient did not experience any exacerbation of his  congestive heart failure during hospitalization.  Vital signs were stable.  He had no complaints of any other chest pain or shortness of breath that he  presented with which was attributed to chronic obstructive pulmonary  disease.   Problem 4. Chronic obstructive pulmonary disease.  Continue Advair Diskus  and Combivent, also albuterol p.r.n.  The patient did not have any  exacerbation of COPD during this hospitalization and physical examination  was with decreased breath sounds but no wheezes or rhonchi.   Problem 5. Hypercholesterolemia.  Continue taking Lipitor 40 mg daily.   Problem 6. Anxiety.  Well controlled with Lexapro and Xanax.   Problem 7. Reflux esophagitis.  The patient takes Zantac 75 mg p.o. daily  and recommended he change to Prilosec or Protonix.   LABORATORY DATA AT DISCHARGE:  Sodium 133, potassium 4.1, chloride 96, CO2  27, BUN 29, creatinine 1.6, glucose 126, calcium 8.3.  White blood cell  count 15.4, hemoglobin 13.2, hematocrit 38.9, platelets 242,000.  INR 2.1.  Liver functions, amylase and lipase within normal limits.  Cardiac panels x2  within normal limits.   EKG:  Normal sinus rhythm, inferior-posterior infarct, age indeterminate.   CONSULTATIONS:  None.   PROCEDURES:  None.      IM/MEDQ  D:  08/08/2004  T:  08/08/2004  Job:  [id number]   cc:   Rodolph Bong, M.D.  [address] [place], [place] [address]  Fax: [phone]

## 2010-08-09 NOTE — Discharge Summary (Signed)
[name], [name]               ACCOUNT NO.:  [id number]   MEDICAL RECORD NO.:  [id number]          PATIENT TYPE:  INP   LOCATION:  3705                         FACILITY:  [facility]   PHYSICIAN:  Todd Fiddler, MD DATE OF BIRTH:  08/03/1940   DATE OF ADMISSION:  10/07/2004  DATE OF DISCHARGE:  10/09/2004                                 DISCHARGE SUMMARY   DISCHARGE DIAGNOSES:  1.  Cellulitis.  2.  Congestive heart failure.  3.  Chronic obstructive pulmonary disease.  4.  Diabetes mellitus.  5.  Anxiety.  6.  Chronic pain.  7.  Dehydration/acute renal failure.   PROCEDURES:  1.  Lower extremity Dopplers on October 07, 2004 which were negative for deep      venous thromboses.  2.  X-ray of the left lower extremity on October 07, 2004 showing soft tissue      edema, but no signs of osteomyelitis.  3.  Chest x-ray on October 07, 2004 showing no acute disease.   CONSULTANT:  There were no consulting physicians.   HOSPITAL COURSE:  The patient was admitted on October 07, 2004 with left lower  extremity pain and with weakness causing the patient to be unable to  ambulate.  The patient has multiple medical problems, but did have a  decrease in his ability to do things around the house.  He had redness and  swelling in the left lower extremity as well as pain and some weakness.  He  was suspected to have cellulitis, based on clinical impression, but it was  determined that he could also have a DVT.  He was started on vancomycin and  a D-dimer test was positive; however, this was followed up the next day with  bilateral lower extremity Dopplers which were negative for DVT.  After 2  days with no growth on the cultures, he was changed from vancomycin over to  Augmentin.  For his multiple medical problems, he was continued on his home  regimen for each, since he seemed stable except for the new cellulitis,  except he had some mild hypotension down to the 90s systolic, so his  hydrochlorothiazide and Altace were held throughout his hospital stay.  These were held on discharge as well because the patient's blood pressures  remained in the low-to-normal range and it was determined that these were  not needed because we did not want the patient to become hypotensive.  The  patient's cellulitis did not worsen during the hospital course and it did  seem to improve, as the patient was able to walk, it seemed that the  swelling went down considerably and the redness seemed to be slightly  improving.  He did start experiencing itching and was counseled that this is  appropriate and to be expected when recovering from cellulitis.  He was  given Benadryl 25 mg p.o. q.6 h. p.r.n. for the itching and Eucerin cream  one application twice a day to help prevent itching and flaking and dryness.   PROBLEM #1 - CELLULITIS:  The patient was ruled out for DVTs with bilateral  lower extremity Dopplers on October 07, 2004.  He was started on vancomycin and  sent home on Augmentin 850 mg b.i.d. x14 days since the blood cultures were  negative x48 hours.  He seemed to be improving.  He was also started on  Benadryl 25 mg p.o. q.6 h. p.r.n. for itching and Eucerin cream one  application b.i.d. to prevent itching.   PROBLEM #2 - MULTIPLE MEDICAL PROBLEMS:  The patient was continued on his  home regimen for his multiple medical problems, except for his hypertension.  He was mildly hypotensive to the 90s systolic and therefore his  hydrochlorothiazide and Altace were held during his hospital stay.   PROBLEM #3 - DEHYDRATION/ACUTE RENAL FAILURE:  The patient came in with a  mild increase in creatinine to 1.8; this was determined to be prerenal and a  result of dehydration, as it responded very well to IV fluids and at  discharge was 1.2.   ADMISSION LABORATORY DATA:  Sodium 139, potassium 4.5, chloride 100, CO2 28,  glucose 101, BUN 30, creatinine 1.8, calcium 9.0.  White blood cell count   14.1, hemoglobin 12.6, hematocrit 37.8, platelets 271,000.  Creatine kinase  total 39, CK-MB 1.8.  PT/INR was 2.3, APPT 46.  D-dimer 0.61.   DISCHARGE LABORATORY DATA:  White blood cell count 13.7, hemoglobin 12.8,  hematocrit 38.0, platelets 298,000.  Sodium 140, potassium 3.8, chloride 98,  CO2 30, glucose 95, BUN 17, creatinine 1.2, calcium 9.1.  Blood cultures  negative.   FOLLOWUP LIST:  1.  The patient will follow up on October 20, 2004 with I believe Dr. Tanya [name]      at the [facility].  He had this appointment scheduled      earlier anyway.  He will be reevaluated for improvement of his      cellulitis and at that time, determine whether to stop Augmentin.  2.  At the patient's followup on October 20, 2004, it is important to also      reevaluate the need for hydrochlorothiazide and Altace, since the      patient's blood pressure was low to normal without these medications      during his hospital stay.      [name]   AL/MEDQ  D:  10/09/2004  T:  10/10/2004  Job:  [id number]   cc:   Todd John T. Pamalee Leyden, MD  Fax: [phone]

## 2010-08-09 NOTE — Discharge Summary (Signed)
[facility]  Patient:    [name], [name] Visit Number: [id number] MRN: [id number]          Service Type: MED Location: 2000 2005 01 Attending Physician:  Rudean Hitt Dictated by:   Clovis Pu Patty Sermons, M.D. Admit Date:  07/29/2001 Discharge Date: 08/03/2001   CC:         Gwenlyn Perking, M.D.  Rinaldo Ratel, M.D.   Discharge Summary  FINAL DIAGNOSES: 1. Coronary artery disease with acute inferior wall myocardial infarction. 2. Status post coronary artery bypass grafting in 1992 by Dr. Kennon Portela. 3. Prior cigarette abuse. 4. Hyperlipidemia. 5. Exogenous obesity. 6. Osteoarthritis. 7. Chronic Coumadin anticoagulation presently subtherapeutic on admission. 8. Cardiomegaly.  OPERATIONS PERFORMED: None.  HISTORY:  This is a 70 year old married Caucasian gentleman with known ischemic heart disease admitted with prolonged chest pain to the emergency room.  He has had coronary artery disease dating back to 1992 when he underwent coronary artery bypass grafting surgery by Dr. Andrey Campanile.  In February of 2002 he was admitted with a crescendo angina and was found to have stenosis of the saphenous vein graft to the right coronary artery and underwent urgent stenting by Dr. Elease Hashimoto.  Later the same day the patient became shocky and had to be taken back to the laboratory where he was found to have an acute closure of the stented area which was opened with a series of stents and post-cath he had a stormy course with hypotension an elevated cardiac enzymes, but eventually stabilized and was discharged and went home.  Prior to discharge at that time, he had an Adenosine Cardiolite which showed a large inferolateral infarction with only a small amount of potentially reversible ischemia and it was felt that if he remained clinically stable he would not need redo coronary artery bypass grafting but continued medically therapy with  long-term Coumadin would be employed.  He also stopped smoking at that point. He has also been attending the cardiac rehab program.  He has been monitored for his Coumadin through Dr. Elbert Ewings at the [facility].  The patient on the day of admission developed substernal pain with left arm radiation.  He took nitroglycerin and extra aspirin at home with temporary relief, came to the emergency room where he was evaluated and admitted.  Examination was unremarkable.  Lungs were clear.  The heart revealed no gallop. Extremities showed good peripheral edema. His electrocardiogram did show new ST segment elevation in the inferior leads consistent with inferior wall injury and probable extension of his old myocardial infarction. A chest x-ray showed cardiomegaly but no congestive heart failure.  Initial laboratories included a troponin of 0.15 consistent with myocardial damage, CK MB was 3.0. His Protime was subtherapeutic with an INR of 1.1.  HOSPITAL COURSE: In the emergency room the patient became pain free with one dose of morphine and IV nitroglycerin drip.  It was felt that he was having a small inferior wall myocardial infarction which could best be managed conservatively.  He was not taken to the Cardiac Catheterization Lab. He was kept on nitrates and Lovenox was used until Protime became therapeutic.  Beta blockers were added and ACE inhibitors were added during the hospital stay. The patient did have some exertional angina midway during the hospital stay which disappeared after his beta blockers were increased and he had no further symptoms.  One day prior to discharge he was able to walk in the hall six times  with no symptoms.  Rhythm remained stable on beta blockers.  Blood pressure on beta blockers was 100/60.  It was felt that the patient had reached maximal medical benefit and was able to be discharged home improved on Aug 03, 2001 on the following  medications.  DISCHARGE MEDICATIONS:  1. Plavix 75 mg one daily.  2. Lasix 40 mg daily.  3. Lipitor 40 mg daily.  4. Combivent inhaler two puffs twice a day.  5. Advair Diskus one puff twice a day.  6. Effexor 75 mg twice a day.  7. Protonix 40 mg daily.  8. Altace 5 mg daily.  9. Warfarin 5 mg five days a week and 7.5 mg two days a week which was     his previous chronic dosage. 10. Metoprolol 50 mg one half tablet three times a day. 11. Isosorbide mononitrate 60 mg each a.m. 12. Percocet one every six hours p.r.n. 13. Albuterol nebulizer four times a day. 14. Nitrostat 1/150 sublingually p.r.n.  It was emphasized how important it was not to run out of the Warfarin. That was the cause of his low INR on admission which was the fact that he had run out of medicine.  ACTIVITY: He is to walk as tolerated.  He will be rejoining the [facility] phase two rehab program.  DIET:  Low cholesterol weight loss diet.  FOLLOW-UP: 1. He is to get a Protime and see Dr. Gwenlyn Perking in follow-up on    Aug 06, 2001. 2. He is to continue Warfarin and Protime follow-up with Dr. Elbert Ewings. 3. He will see Dr. Patty Sermons in one month.  CONDITION ON DISCHARGE:  Improved.  ACCESSORY LABORATORY STUDIES:  2 dimensional echocardiogram done one day prior to discharge which showed an ejection fraction of 40 to 50% and inferior wall akinesis and no significant valve problems. INR on discharge is 1.5 and he will get his final dose of Lovenox on the morning of discharge. Dictated by:   Clovis Pu Patty Sermons, M.D. Attending Physician:  Rudean Hitt DD:  08/03/01 TD:  08/04/01 Job: [id number]
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
## 2010-08-09 NOTE — Discharge Summary (Signed)
[name], [name]               ACCOUNT NO.:  [id number]   MEDICAL RECORD NO.:  [id number]          PATIENT TYPE:  INP   LOCATION:  2015                         FACILITY:  [facility]   PHYSICIAN:  Titus Dubin. Hopper, MD,FACP,FCCPDATE OF BIRTH:  1940-08-30   DATE OF ADMISSION:  06/19/2006  DATE OF DISCHARGE:                               DISCHARGE SUMMARY   Admitted 06/20/2006-discharged 06/28/2006   ADMITTING DIAGNOSES:  1. Left lower extremity ischemia and pain.  2. Peripheral vascular disease with left popliteal stenosis.  3. Congestive heart failure.  4. Coronary disease, asymptomatic.  5. Type 2 diabetes on 70/30 insulin.  6. Obstructive sleep apnea , CPAP dependent.  7. Chronic renal failure with hyperkalemia.  8. Chronic obstructive pulmonary disease.   DISCHARGE DIAGNOSES:  1. Left lower extremity ischemia and pain.  2. Peripheral vascular disease with left popliteal stenosis.  3. Congestive heart failure.  4. Coronary disease, asymptomatic.  5. Type 2 diabetes on 70/30 insulin.  6. Obstructive sleep apnea with use of CPAP.  7. Chronic renal failure with hyperkalemia.  8. Chronic obstructive pulmonary disease.  9. Ischemic left foot.  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  11.Revision of left femoral popliteal bypass to the femoral tibial      bypass with inter position reverse cephalic vein, June 22, 2006.   The patient was seen preoperatively by Dr. Verdis Prime. cardiologist,  who felt that the patient could tolerate surgery.  Review of records  indicated that the catheterization in December 2006, had demonstrated  totally occluded saphenous vein graft to the obtuse marginal with  occluded saphenous vein graft to the PDA and atretic left internal  mammary graft to the LAD.  Additionally, native right and  circumflex  coronary arteries were totally occluded.  The LAD was widely patent  supplying collaterals.  His ejection fraction in May 2007, on  echocardiogram had been 35%.  He was felt to have stable angina over the  previous 6-12 months with compensated class II congestive heart failure  in the context of sleep apnea and chronic obstructive pulmonary disease.  He was felt to be at moderately increased risk in the 2-6% range.  Continuation of his cardiac vasoactive agents pre and postoperatively  was recommended with a beta blocker, Statin, ACE inhibitor, and long-  acting nitrates.   The patient tolerated the surgery without significant complication.  He  remained essentially afebrile.  Temperature max was 99.3.  Blood  pressures were well-controlled  & in fact, as low as 83/40 on occasion.  The systolic was never above 130, and the diastolic was never above 67.   Sliding scale NovoLog was employed in addition to his 70/30 regimen at  home.   Chest x-ray showed atelectasis or scar at the left lung base with no  acute cardiopulmonary findings.  CT angio of the chest revealed minimal  bibasilar atelectasis and evidence of previous bypass grafting.  He had  no evidence of pulmonary embolus.  Mild splenomegaly was noted.  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  There  was an irregularity of the pseudoaneurysm with a filling defect  suggestive of embolus.  There was occlusion of the popliteal artery  above the knee.   The peripheral vasculature study revealed the right ABI data to be  within normal limits with moderate to severe flow in the left ABI.  EKG  revealed an inferior/posterior infarct age-undetermined, and nonspecific  ST-T wave changes.  There had been no change significantly compared to  May 27, 2005.   At  the time of admission, his white count was 17,100, and hematocrit  41.9.  Final hematocrit was 25.7, and white count 11,400.  His  hematocrit remained in the range of 25.7 to 29 from June 22, 2006, to  the day of discharge.  His prothrombin time was managed by pharmacy and  INR ranged from 1.2 to 1.8.  Potassium was held on admission because of  potassium 5.5.  His BUN ranged from 7 to 17, and creatinine from 0.88 to  1.15.  His glomerular filtration rate was calculated as over 60,  surprisingly, despite his peripheral vascular disease.  A1c was 8,  indicating uncontrolled diabetes with an average blood sugar of 205, and  a 60% increased risk of heart attack or stroke, prematurely.  His BNP  was only mildly elevated at 142.  TSH was therapeutic.  Glucose control  during the hospitalization was good.  His glucose ranged from 91 to 176.   He was seen by cardiology on June 27, 2006, and discharge was  recommended with followup with Dr. Patty Sermons.  Dr. Edilia Bo had  recommended discharge on June 28, 2006.  When seen on June 27, 2006, the  patient essentially demanded to go home.  He still had a Foley catheter.  The Foley catheter had been placed because when it was removed  initially, he was incontinent on the floor.  His nurse also stated he  was very garrulous and throwing food.  At his table side were potato  chips, [employer] fast food, and Kosher pickles.  His wife stated this  was her food and not his.  He is on no diet, but made a comment, I do  not do much though.   His fasting glucose on the morning of discharge was 98.  His home  regimen was verified with him.  If his sugar is over 135, he takes 80  units of 70/30, if it is approximately 100, he takes 70 units.  If it is  less than 100, he will take 60 units.  He said it has never been less  than 90.  His wife stated his sugars have been 300 or more prior to instituting this regimen.  I did recommend that they consider the book   Sugar Busters or the cookbook, The Flat Belly Diet.  Basically, there  was no response and the patient and his wife showed no interest in any  dietary or nutritional suggestions.   He was afebrile with pulse less than 94, rest or rate 20.  Blood  pressure was 122/66.  O2 sats were 92% on room air.  He had no increased  work of breathing.  Heart: Rhythm was regular.  He exhibited a classic  metabolic syndrome.  Ischemic  change was suggested at left great toe;  but the CVTS service felt perfsion was adequate & incisions were clean &  not infected.Staples were to be removed 4/10 by Dr. Madilyn Fireman in his office.   Hematocrit was 25.8, which is essentially stable & INR was 1.8.   He was  monitored overnight after discontinuation of the Foley & Flomax  0.4 mg was initiated for incontinence.Additionally Xanax 0.5 mg bid prn  was Rxed @ request of his wife. Nuiron 150 mg was the only other new  medication. No change was made in his  ASA,Imdur,Lipitor,Metoprolol,Ultram,furosemide,70/30  SSI,Altace,coumadin,potassium.   His discharge status is improved.   His prognosis is guarded in view of his lack of compliance with  nutritional and interventional preventative health measures.  The  patient lacks any insight or comprehension of his risk.      Titus Dubin. Alwyn Ren, MD,FACP,FCCP  Electronically Signed     [facility]/MEDQ  D:  06/27/2006  T:  06/27/2006  Job:  [id number]   cc:   Lyn Records, M.D.  Balinda Quails, M.D.  Arta Silence, MD  Cassell Clement, M.D.
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
## 2010-08-09 NOTE — H&P (Signed)
[name], [name] NO.:  [id number]   MEDICAL RECORD NO.:  [id number]          PATIENT TYPE:  INP   LOCATION:  2927                         FACILITY:  [facility]   PHYSICIAN:  Ulyses Amor, MD DATE OF BIRTH:  [birth year]   DATE OF ADMISSION:  03/17/2005  DATE OF DISCHARGE:                                HISTORY & PHYSICAL   [name] is a 70 year old white man who is admitted to [facility] for chest pain.   The patient has an extensive history of cardiac disease.  He underwent  coronary artery bypass surgery in 1992.  In 1999 he underwent stenting of  the saphenous vein graft to the circumflex due to acute closure.  In 2002 he  underwent stenting of the saphenous vein graft to the right coronary artery  also due to acute closure.   The patient awoke this morning at approximately 3 a.m. with chest pain.  This was described as a substernal pressure.  It did not radiate.  It was  associated with dyspnea and no diaphoresis.  There was no nausea.  He was  transported to the emergency department at which time the cardiac  catheterization team was summoned.   The patient has a history of congestive heart failure.   Other medical problems include hypertension, dyslipidemia, chronic  obstructive pulmonary disease, obesity, and non-insulin-dependent diabetes  mellitus.   MEDICATIONS:  Hydrochlorothiazide, Altace, isosorbide mononitrate,  potassium, torsemide, aspirin, alprazolam, hydrocodone/acetaminophen,  metoprolol, Lexapro, Coumadin, Lipitor, Glucovance, and Zantac.   ALLERGIES:  VALIUM.   The remainder of the history as well as the physical examination, review of  the laboratory data, electrocardiogram, impression, and plan will be  performed and dictated by Dr. Donnie Aho.      Ulyses Amor, MD  Electronically Signed     [facility]/MEDQ  D:  03/17/2005  T:  03/17/2005  Job:  [id number]   cc:   Georga Hacking, M.D.  Fax: [phone]  Email:  [email or web address]

## 2010-08-09 NOTE — Discharge Summary (Signed)
NAMEALEXSANDER, [name]               ACCOUNT NO.:  [id number]   MEDICAL RECORD NO.:  [id number]          PATIENT TYPE:  INP   LOCATION:  4739                         FACILITY:  [facility]   PHYSICIAN:  Rene Paci, M.D. LHCDATE OF BIRTH:  1940/06/15   DATE OF ADMISSION:  05/27/2005  DATE OF DISCHARGE:  06/05/2005                                 DISCHARGE SUMMARY   DISCHARGE DIAGNOSIS:  1.  Acute on chronic congestive heart failure.  2.  Renal insufficiency.  3.  Obstructive sleep apnea.  4.  Diabetes type 2.  5.  Chronic anticoagulation.  6.  Chronic obstructive pulmonary disease.   HISTORY OF PRESENT ILLNESS:  The patient is a 70 year old male admitted with  complaints of shortness of breath and increased swelling.  He was admitted  on May 27, 2005, after being evaluated by the home health nurse.  The  patient was admitted for further evaluation.   PAST MEDICAL HISTORY:  1.  Coronary artery disease status post CABG in 1992, status post cath in      1999 with stent, status post cath in 2002 with stent, status post cath      in 2006.  2.  Peripheral vascular disease status post carotid endarterectomy and      popliteal tibial bypass.  3.  Ischemic cardiomyopathy with left ventricular ejection fraction of 40%      per cath in December 2006.  4.  Hypertension.  5.  Dyslipidemia.  6.  Chronic obstructive pulmonary disease, O2 as needed.  7.  Type 2 diabetes.  8.  Morbid obesity.  9.  History of anxiety and chronic pain.  10. Obstructive sleep apnea and CPAP at that time.   HOSPITAL COURSE:  Problem 1:  Acute on chronic heart failure.  The patient was admitted and  was evaluated by cardiology, Dr. Patty Sermons.  A 2D echo was performed and  revealed moderate decrease in overall left ventricular systolic function  with an estimated ejection fraction of 35%.  There was also noted to be  akinesis of the entire inferior wall.  The patient was diuresed with good  improvement during  this hospitalization.   Problem 2:  Renal insufficiency.  The patient was noted during diuresis to  develop some renal insufficiency with a creatinine as high as 2.3 during  this admission.  As a result, Glucovance was discontinued.  The most recent  creatinine performed on June 04, 2005, was 1.4.  The patient did develop  some complications in that he developed hypotension with CHF meds and Lasix.  He was given gentle IV hydration and his medication dosages were adjusted.  The patient's blood pressure appears stable this morning, will check two  hours after administration of new lower doses, and if blood pressure remains  stable, plan to discharge to home this afternoon.   Problem 3:  Diabetes type 2.  Glucovance was discontinued secondary to renal  insufficiency.  He was maintained on insulin 70/30 and his CBGs have  remained less than 150 on insulin 70/30, 20 units twice daily.  Plan to  continue insulin at  this dose as an outpatient, however, this may need to be  titrated up if patient's sugars begin to run higher at home.   Problem 4:  Chronic anticoagulation.  The patient was maintained on  Coumadin, aspirin, and Plavix during his hospitalization.  It was discussed  with cardiology in regards to need for three agents and Dr. Patty Sermons  requested that Plavix be discontinued.   DISCHARGE MEDICATIONS:  1.  Toprol XL 12.5 mg p.o. daily.  2.  Lasix 40 mg p.o. daily.  3.  Coumadin 7.5 mg on Wednesdays and Sundays, 5 mg on other days as before.  4.  Lipitor 40 mg p.o. daily.  5.  The patient is to stop taking Plavix.  6.  Aspirin 325 mg p.o. daily.  7.  Imdur 30 mg p.o. daily.  8.  Xanax 0.5 mg p.o. b.i.d.  9.  Lexapro 10 mg p.o. daily.  10. Vicodin 1 tab every 4 hours as needed.  11. Altace 2.5 mg p.o. daily.  12. Insulin 70/30, 20 units twice daily.   The following medications have been discontinued during this admission:  Plavix, HCTZ, potassium, Torsemide, Toprol,  Glucovance.   LABORATORY DATA AT DISCHARGE:  INR 1.6, BUN 32, creatinine 1.4.  Hemoglobin  11.9, hematocrit 34.6.   FOLLOW UP:  We have requested home health RN follow up in regards to the  patient's CHF management.  The patient is scheduled for follow up with Dr.  Hetty Ely on Thursday, March 22, at 2:45 p.m.  At this visit, he will need a  repeat BMP to evaluate potassium as potassium was discontinued during this  admission.  He will also need a follow up PT/INR.  He is being sent home on  his preadmission dosing of Coumadin.  The patient is also instructed to  follow up with Dr. Patty Sermons of cardiology and call for an appointment.  He  is instructed to call Dr. Hetty Ely should he develop weakness, dizziness,  fever of 101, shortness of breath, blood sugar over 300 or less than 80, and  he is instructed to use oxygen as needed and CPAP at night.      Melissa S. Peggyann Juba, NP      Rene Paci, M.D. [facility]  Electronically Signed    MSO/MEDQ  D:  06/05/2005  T:  06/06/2005  Job:  [id number]   cc:   Laurita Quint, M.D.  Fax: [phone]   Cassell Clement, M.D.  Fax: [phone]

## 2010-08-09 NOTE — Discharge Summary (Signed)
NAME:  [name], [name]                         ACCOUNT NO.:  [id number]   MEDICAL RECORD NO.:  [id number]                   PATIENT TYPE:  OUT   LOCATION:  EKG                                  FACILITY:  [facility]   PHYSICIAN:  Rebecca L. Jolinda Croak, M.D.          DATE OF BIRTH:  [birth year]   DATE OF ADMISSION:  03/10/2003  DATE OF DISCHARGE:  03/14/2003                                 DISCHARGE SUMMARY   CONSULTATIONS:  Dr. Meade Maw.   DISCHARGE DIAGNOSES:  1. Left ventricular dysfunction with an ejection fraction of 25%.  2. Congestive heart failure, secondary to number one.  3. Hyperglycemia.  4. Hyperlipidemia.   DISCHARGE MEDICATIONS:  1. Glucophage 500 mg, one p.o. b.i.d.  2. Combivent two puffs q.i.d.  3. Advair 250 mg/50 mg, one puff q.12h.  4. Aldactone 50 mg, one p.o. daily.  5. Altace 5 mg, one p.o. daily.  6. Effexor 75 mg, one p.o. daily.  7. Imdur 60 mg, one p.o. daily.  8. K-Dur 20 mEq, one p.o. daily.  9. Lasix 80 mg, one p.o. q.12h.  10.      Lopressor 25 mg, one p.o. b.i.d.  11.      Plavix 75 mg, one p.o. b.i.d. with meal.  12.      Protonix 40 mg, one p.o. b.i.d. with meal.  13.      Aspirin 81 mg, one p.o. daily.  14.      Coumadin 5 mg p.o. daily.  15.      Alprazolam 0.25 mg, one p.o. q.12h. p.r.n. anxiety.  16.      Lipitor 40 mg, one p.o. daily.   HISTORY OF PRESENT ILLNESS:  The patient is a 70 year old white male patient  of Dr. Maurice March through the [facility], with an extensive cardiac history.  He presented with complaints  of dizziness with exertion x2-3 days.  This was accompanied by weakness and  falling into objects, secondary to his dizziness.  The patient also  complained of chest pain that was increasing in frequency, and relieved with  nitroglycerin.  The chest pain cleared approximately once a week and lasted  anywhere from minutes up to half an hour.   PHYSICAL EXAMINATION:  VITAL SIGNS:   Temperature 97.2 degrees, blood  pressure 118/73, pulse 99, with the statics negative.  EXTREMITIES:  Significant for 1+ pitting edema bilaterally in the lower  extremities.  Otherwise within normal limits.   LABORATORY DATA:  At the time of admission INR 4.1.  Electrocardiogram  showed a normal sinus rhythm, with old Q-waves in the inferior leads, new Q-  waves in the lateral leads since May 2004.  Also showed left ventricular  hypertrophy.   HOSPITAL COURSE:  The patient was admitted to telemetry.  Cardiac enzymes  were negative.  Dr. Cassell Clement of cardiologist was consulted.  Eventually the patient had a Cardiolite stress  test, which showed a resting  left ventricular ejection fraction of 25%, marked inferior septal  hypokinesis, with diffuse hypokinesis otherwise.  The findings were  suspicious for inferolateral ischemia.  It should be noted that the patient  was status post coronary artery bypass graft surgery in 1991.  The patient  was virtually asymptomatic with regards to chest pain during his admission.   DISPOSITION:  He was cleared on March 14, 2003, by cardiology to be  discharged home on the above-mentioned medications.  The patient was  instructed to follow up with Dr. Patty Sermons in one to two weeks.   With regards to chronic obstructive pulmonary disease, the patient was  maintained on BiPAP for sleep.  He was maintained on Advair, albuterol and  Atrovent during his stay.   With regards to congestive heart failure, as stated above the patient had an  ejection fraction of 25%.  He was maintained on Lasix, spironolactone,  Metoprolol and Altace.   With regards to hypoglycemia, it was persistent during the patient's stay.  He was seen by a diabetes coordinator, who recommended the initiation of  Glucophage 500 mg, one p.o. b.i.d.  This was made so on discharge.  He had  an elevated INR, as stated above, which was approximately 4 on admission.  The patient's  Coumadin was held in the first three to four days of his  hospital stay, until his INR was 2.6 on March 14, 2003.  It should be  noted that the patient had reported a history of a myocardial infarction  when he was not on Coumadin, at some time in the remote past.  The patient  had had a history of a CVA, and as such the indication for Coumadin.  The  pharmacy assisted with dosing of Coumadin during the patient's stay.  On  March 14, 2003, the patient was discharged on 5 mg p.o. daily.   DISPOSITION/FOLLOWUP:  The patient was instructed to check blood sugars  before breakfast and after supper.  Also instructed to follow up ith. Dr.  Olegario Messier ________________ at the [facility] in one to two weeks for an INR check.  The patient also instructed to  follow up with Dr. Patty Sermons, cardiologist.      Franklyn Lor, MD                            Randon Goldsmith. Jolinda Croak, M.D.    TD/MEDQ  D:  06/23/2003  T:  06/26/2003  Job:  [id number]   cc:   Cassell Clement, M.D.  [address]  [place]  [place] [address]  Fax: [phone]

## 2010-08-09 NOTE — Op Note (Signed)
[place]  Patient:    JAYLEEN, AFONSO                      MRN: [id number] Proc. Date: 08/12/99 Adm. Date:  [id number] Disc. Date: [id number] Attending:  Donalee Citrin P                           Operative Report  PREOPERATIVE DIAGNOSIS:  Cervical radiculopathy C5 and C6 right secondary to herniated nucleus pulposus at C4-5 and C5-6 right and cervical spondylosis.  POSTOPERATIVE DIAGNOSIS:  OPERATION PERFORMED:  Anterior cervical diskectomy and fusion C4-5 and C5-6 with fibula allograft and Atlantis plate.  SURGEON:  Donzetta Sprung. Roney Jaffe., M.D.  ASSISTANT:  Julio Sicks, M.D.  ANESTHESIA:  General endotracheal.  IV FLUIDS:  1400 cc.  ESTIMATED BLOOD LOSS:  Less than 50.  HISTORY OF PRESENT ILLNESS:  The patient is a very pleasant gentleman who presented with right arm pain that radiated to her shoulder and into the thumb and the first forefinger of his right hand.  Preoperative assessment and imaging revealed a ruptured disk at C4-5 and C5-6.  EMG also confirmed the presence of a C5 and C6 radiculopathy.  Patient was sent for preoperative clearance from his family practitioner and his pulmonologist.  The patient was cleared and now presents for surgery.  The patient was brought to the operating room, was induced under general anesthesia, was positioned supine, was intubated.  He was positioned in halter traction with five pounds of traction.  The right side of his neck was prepped and draped in routine sterile fashion.  Intraoperative x-ray confirmed 3-4 interspace.  An incision was made slightly inferior to this with a 20 blade scalpel.  The Metzenbaum scissors were used to dissect out the platysma.  The platysma was divided transversely with Bovie electrocautery.  The platysma was then divided inferiorly.  The avascular plane between the omohyoid and the sternocleidomastoid was developed down to the spine maintaining the carotid laterally and  the esophagus medially.  The spine was palpated, Kittners were used to dissect off the prevertebral fascia.  The interspace was identified and a needle was placed.  Intraoperative x-ray confirmed the 3-4 interspace. Attention was then taken down inferior to that and the 4-5 interspace was identified and marked as well as the 5-6 interspace.  The longus colli was taken out bilaterally with a combination of bipolar and Bovie electrocautery. After the longus was divided, the self-retaining retractors were placed as well as the up and down retractors displaying out the 4-5 and 5-6 interspaces. The 4-5 interspace was incised with a 15 blade scalpel, then the pituitary was used to take off the annulus.  The Midas Rex drill was brought in and the rest of the 4-5 interspace was drilled down posteriorly.  The BA and FA curets were also used to clear off the end plates and get down to the posterior aspect of the posterior longitudinal ligament.  Then the 1 mm Kerrison rongeur was used to get underneath the posterior aspect of the annulus and the posterior longitudinal ligament.  This was carried out bilaterally with emphasis taken in the right neural foramen and C5 with a combination of the upgoing curets and the 1 and 2 mm Kerrisons.  This diskectomy was carried out underneath the ligament out laterally to the neural foramen.  The C5 foramen on the right side was completely decompressed was probed  with a black hook and this was noted to be completely decompressed.  This was all done under the operating microscope and under microscopic dissection, the proximal aspect of the left C5 nerve root was cleaned out with the 1 and 2 mm Kerrison punches.  Vigorous hemostasis was maintained. Attention was then taken down to the 5-6 interspace.  The microscope was moved down.  The Midas Rex drill was brought in after the anterior longitudinal ligament and annulus incised with a 15 blade scalpel using pituitary  rongeurs.  The disk was removed and using 1 and 2 mm Kerrison punches was used to take down the posterior osteophyte down to the posterior longitudinal ligament and this ligament was identified out laterally on the right and the 1 and 2 mm Kerrison rongeurs were used to remove a large osteophyte that was compressing the right C6 proximal aspect of the neural foramen.  The ligament was incised and the dura was visualized and this decompression was carried out laterally until the proximal aspect of the C6 neural foramen was completely decompressed and probed with the right angle black hook.  Then attention was carried out on the left side with again, complete decompression of the central disk herniation as well as out laterally on the left at C6 proximal aspect of that neural foramen.  Both neural foramina were noted to be completely decompressed and the end plates were evened off with a Midas Rex drill.  Meticulous hemostasis was maintained.  A 6 mm fibular allograft was placed in the 5-6 interspace.  Subsequently a 7 mm fibular allograft was placed in the 4-5 interspace.  The microscope was removed and then under direct visualization, a 45 mm titanium Atlantis plate was lined up and using a 13 mm drill bit, the holes were drilled initially in the body of C6 and this was tapped with 13 mm tap.  A 14 mm screw was placed. Then attention was taken out to the C4 body.  A hole was drilled, tapped and a 14 mm screw was placed there.  14 mm screws were also placed in the opposite side of C4 as well as two screws in C5, a total of six 14 mm screws were placed.  The wound was copiously irrigated.  Again meticulous hemostasis was maintained and 7 mm fully fluted flat Jackson-Pratt drain was placed.  The wound was closed with 3-0 interrupted Vicryls in the platysma and a 5-0 PDS runner in the skin.  Steri-Strips were applied.  The wound was dressed.  The patient was sent to the recovery room in stable  condition.  At the end of the operation all sponge, needle and instrument counts were correct. DESCRIPTION OF PROCEDURE: DD:  08/12/99 TD:  08/16/99 Job: [id number]

## 2010-08-09 NOTE — Assessment & Plan Note (Signed)
[facility]                             PULMONARY OFFICE NOTE   Todd, [name]                      MRN:          [id number]  DATE:03/12/2006                            DOB:          15-Feb-1941    I saw [name] today in followup for his obstructive sleep apnea,  COPD and hypoxemia.   Since his last visit he had undergone a CPAP titration study on December  11 and he was titrated to a CPAP pressure setting of 14 cm of water with  a reduction in his apnea-hypopnea index to 1.  He did require 2 L of  supplemental oxygen to maintain his oxygen saturation.  [name] said  that he did quite well with the titration study and felt that using a  full face mask would be somewhat easier for him to use.   With regards to his COPD, I had attempted him to undergo pulmonary  function tests but he was not able to perform the test maneuvers.  He  did have an arterial blood gas analysis done on room air which showed a  pH of 7.48, PCO2 of 40.9 and PO2 of 67. He continues to use his Advair  250/50 1 puff b.i.d. as well as DuoNeb q.i.d.   He says that his symptoms of thrush have improved after he was given the  course of nystatin.   His medication list was reviewed.   He has been complaining of having a scratchy throat.  He denies any  fevers, chills, sweats, chest pain, abdominal pain, sputum production or  hemoptysis.   PHYSICAL EXAMINATION:  He is 289 pounds.  Temperature is 98.1.  Blood  pressure is 120/72.  Heart rate is 80.  Oxygen saturation is 95% on room  air.  HEENT:  There is no sinus tenderness, no nasal discharge, no oral  lesions.  HEART:  Was S1, S2.  CHEST:  With decreased breath sounds but no wheezing or rales.  ABDOMEN:  Obese, soft, nontender.  EXTREMITIES:  With no edema.   ASSESSMENT AND PLAN:  1. Obstructive sleep apnea.  I will have his pressure changed to 14 cm      of water for his CPAP machine.  He is also to use 2 L  of      supplemental oxygen while using his CPAP machine.  I will also have      him get a heated humidifier and use a full face mask as he seems to      tolerate this better.  I have again discussed with him the      importance of diet, exercise and weight reduction.  2. Chronic obstructive pulmonary disease.  He seems to be reasonably      stable on his current regimen of Advair 250/50 one puff b.i.d. and      the DuoNeb nebulizer.  I will therefore discontinue his Spiriva      which he is not using anyway because he could not afford it.  3. Thrush.  This appears to have resolved after use of the  nystatin.  4. Hypoxemia.  I would continue him on the supplemental oxygen at      night.  5. 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  Again I would like to have him      stabilized on his CPAP regimen before investigating this further.  6. A history of deep vein thrombosis and pulmonary embolism.  He is to      continue on Coumadin therapy for this.   I will follow up with him in approximately 2 months to assess how he is  doing with his CPAP machine.     Coralyn Helling, MD  Electronically Signed    VS/MedQ  DD: 03/12/2006  DT: 03/12/2006  Job #: [id number]   cc:   Arta Silence, MD  Cassell Clement, M.D.

## 2010-08-09 NOTE — Cardiovascular Report (Signed)
[name]  Patient:    [name], [name]                      MRN: [id number] Proc. Date: 05/12/99 Adm. Date:  [id number] Disc. Date: [id number] Attending:  Doneta Public                        Cardiac Catheterization  HISTORY:  [name] is a 70 year old gentleman with a history of coronary artery disease, status post coronary artery bypass grafting. He is status post PTCA and stenting several years ago. The PTCA was complicated by a dissection of the vein requiring a long stenting procedure. He is referred for heart catheterization for further evaluation.   PROCEDURES PERFORMED: 1. Cardiac catheterization. 2. Percutaneous transluminal coronary angioplasty. 3. Stent placement.  DESCRIPTION OF PROCEDURE:  The right femoral artery was easily cannulated using the modified Seldinger technique.  HEMODYNAMICS: 1. The left ventricular pressure is 161/24 mmHg. 2. Aortic pressure of 161/71 mmHg.  CORONARY ANGIOGRAPHY: 1. Left main coronary artery:  The left main has minor luminal    irregularities but is otherwise unremarkable. 2. Left anterior descending artery: The left anterior descending artery has    moderate irregularities proximally. There is a moderate sized first    diagonal branch which has only minor luminal irregularities. 3. Left circumflex coronary artery: The left circumflex artery gives    off a first obtuse marginal artery and is then occluded. There is    a tight 99% subtotaled stenosis at the origin of this first obtuse    marginal artery. 4. Right coronary artery:  The right coronary artery is occluded at    its ostium. It gives off several collateral vessels after the    ostium. 5. Saphenous vein graft to obtuse marginal vessel:  The saphenous vein graft    to the obtuse marginal vessel is occluded proximally. This was    the site of the previously angioplasty. 6. Saphenous vein graft to right coronary artery:  The  saphenous vein graft    to the right coronary artery is severely diseased. There is an    ulcerated plaque in the proximal aspect of this graft. There is    an ulceration followed by a tight 90% stenosis just prior to the    anastomosis. 7. Left subclavian artery:  The left subclavian artery has minor    luminal irregularities. 8. Internal mammary artery:  There is a small internal mammary artery    which is fairly atretic and does not provide any significant    flow to the coronary circulation.  LEFT VENTRICULOGRAPHY: 1. The left ventriculogram was performed in a 30 RAO positive. 2. It reveals mild to moderate left ventricular dilatation with    mild left ventricular dysfunction.  3. The ejection fraction is between    45-50%. 4. There is mild mitral regurgitation.  PERCUTANEOUS TRANSLUMINAL CORONARY ANGIOPLASTY:  The patient was given IV injection of heparin followed by a double bolus Integrilin drip. The saphenous vein graft to the right coronary artery was engaged using a #7 [place] Judkins right 4 guide. The vessel was widened using a Patriot guidewire. This was followed by a 2.5 x 15 mm Quantum Monorail. It was positioned at the distal stenosis. It was inflated up to 12 atmospheres for 60 seconds, followed by an inflation of 60 atmospheres for 41 seconds. This resulted in some improvement of the vessel lumen but  with a persistent plaque rupture. At this point a 3.0 x 18 mm Penta stent was positioned across the stenosis. It was deployed at 16 atmospheres for 48 seconds. The balloon was then pulled back and was inflated up to 20 atmospheres for 30 seconds, followed by a slightly more proximal inflation of 18 atmospheres for 36 seconds. This resulted in a significant improvement. There was apposition of the stent, but there appeared to be a slight underdeployment in the center of the stent. A 3.5 x 15 mm PowerSail was placed down into the middle of the stent. It was inflated up to  16 atmospheres for 49 seconds. Following this, it became evident that there was a 80-90% stenosis in the posterior descending artery just as it exits from the main right coronary artery. The 2.5 x 15 mm Quantum Monorail was positioned down this segment. It was inflated twice up to 6 atmospheres for 30 seconds each. This resulted in significant improvement of the vessel lumen with brisk flow. There was no evidence of edge dissection.  COMPLICATIONS:  The patient was brought back to the catheterization lab several hours later with acute EKG changes consistent with inferiorly wall myocardial infarction. Please see the subsequent PTCA/stent dictation.DD: 07/06/00 TD:  07/06/00 Job: [id number]

## 2010-08-09 NOTE — Discharge Summary (Signed)
NAME:  [name], [name]                         ACCOUNT NO.:  [id number]   MEDICAL RECORD NO.:  [id number]                   PATIENT TYPE:  INP   LOCATION:  4713                                 FACILITY:  [facility]   PHYSICIAN:  Noelle C. Merilynn Finland, M.D.           DATE OF BIRTH:  09-17-1940   DATE OF ADMISSION:  05/17/2002  DATE OF DISCHARGE:  05/19/2002                                 DISCHARGE SUMMARY   PRIMARY CARE PHYSICIAN:  Dr. Rodman Pickle of the family practice center.   DISCHARGE DIAGNOSES:  1. Congestive heart failure, exacerbation.  2. Rule out myocardial infarction/left arm pain.  3. Chronic obstructive pulmonary disease.  4. Coronary artery disease.   DISCHARGE MEDICATIONS:  1. Zocor 80 mg p.o. daily.  2. Aspirin 325 mg p.o. daily.  3. Plavix 75 mg p.o. daily.  4. K-Dur 20 mg p.o. daily.  5. Advair Diskus 250/50 one puff b.i.d.  6. Combivent two puffs q.i.d.  7. Effexor 75 mg p.o. b.i.d.  8. Lasix 40 mg p.o. b.i.d.  9. Metoprolol 25 mg t.i.d.  10.      Xanax 0.25 mg b.i.d. p.r.n. anxiety.  11.      Coumadin 7.5 mg every Sunday, Tuesday, Wednesday, Thursday,     Saturday.  12.      Coumadin 5.0 mg Monday, Friday.  13.      Imdur 60 mg p.o. daily.  14.      Nitroglycerin 0.4 mg SL p.r.n. chest pain.  15.      Vioxx 12.5 mg p.o. daily p.r.n. pain.  16.      Albuterol nebulizer or metered-dose inhaler every four hours p.r.n.     wheeze.  17.      Prevacid 30 mg p.o. daily.   ACTIVITY:  No restrictions.   DIET:  Low fat, low sodium.   FOLLOW UP:  1. Appointment made with Dr. Raymondo Band on March 11 at 9:00 a.m.  Patient to     clarify COPD regimen.  2. Follow up with family practice, Dr. Rodman Pickle, Wednesday, March 3 at 9:50     a.m.  Patient to also have INR check that day.   CONSULTATIONS:  Cardiology.  Dr. Patty Sermons.  Patient seen by Dr. Patty Sermons.  At the time of discharge the patient was felt to have no pressing  cardiological needs.  Patient to be followed as an  outpatient at family  practice.   PROCEDURE:  None.   HOSPITAL COURSE:  The patient is a 70 year old white male with significant  past medical history of coronary artery disease, COPD, anxiety, CAD,  and  hypercholesterolemia.  The patient initially presented with difficulty  breathing and increased shortness of breath.  This was not relieved by  nebulizer treatments.  The patient also experienced pain in his left side  that radiated to his left arm and shoulder that occurred for approximately  four to five minutes.  This pain  was similar to discomfort experienced by  the patient during a previous heart attack.  The patient's hospital course  can be summarized by his discharge diagnoses as follows:   1. CONGESTIVE HEART FAILURE EXACERBATION.  When patient was seen initially     on admission radiographic findings were consistent with cephalization and     pulmonary edema.  It was felt that the patient due to his longstanding     history of CHF was undergoing an exacerbation of his CHF with increased     retention of fluids.  During the patient's hospitalization he was given     aggressive diuretic therapy with Lasix.  At time of discharge the patient     had diuresed approximately four liters of fluid and had decreased his     weight by approximately three pounds.  The patient stated that he was on     a home regimen of Lasix however, it was felt that there may be some     questions of compliance with home regimen.  Upon initial physical     examination the patient was notable to have crackles bilaterally.  At the     time of discharge crackles were notably decreased.  Also of significant     physical exam, patient had notable decreased pedal edema at the time of     discharge and reported increasing activity and decreasing shortness of     breath.  The patient's CHF was thought to have been improved greatly     through medical therapy while in the hospital.  Patient's regimen at home      for management of CHF was not ideal.  Patient will likely need to be on     an ACE inhibitor, digoxin, and spironolactone.  During the     hospitalization these medications were not started on the patient,     however, the patient has been instructed to follow up with his primary     care physician.  Evaluation will be made at that time and likely the     patient will be started on this regimen.  It is known that these     medications are the typical treatment for CHF and will be started as an     outpatient.  2. RULE OUT MYOCARDIAL INFARCTION, LEFT ARM PAIN.  Given patient's history     of coronary artery disease and risk factors such as previous heart     attack, CABG, and hypercholesterolemia patient was thought to have     significant risk factors for an acute coronary syndrome.  Patient had     three sets of cardiac enzymes drawn.  All enzymes were negative.  Patient     was also evaluated by cardiology who felt that there were no ischemic     events to explain patient's left arm pain.  Left arm pain resolved while     in the hospital stay and was thought to be fairly transient.  The patient     did not require nitroglycerin while in the hospital.  Chest pain also     resolved while in the hospital.  Upon initial presentation, due to the     concerns about patient's coronary artery disease patient was initially     started on a heparin drip.  Patient was also placed on a nitroglycerin     drip.  Both of these were discontinued when the patient ruled out for  acute coronary syndrome.  Evaluation of patient's chest pain with an EKG     showed no significant changes to EKG on day of discharge.  3. CHRONIC OBSTRUCTIVE PULMONARY DISEASE.  The patient's home regimen of     medications for his COPD were thought to be redundant.  Patient was on     Advair, Combivent, and albuterol.  It was felt that the patient's regimen    needs to be simplified however, this will be done as an  outpatient with     Dr. Raymondo Band at the family practice center.  While in the hospital patient     was placed on Advair, and Combivent with albuterol given on a p.r.n.     basis.  On initial presentation the patient was notable for significant     shortness of breath and a fair amount of wheezing.  There was some     uncertainty in regards to the cause of the patient's shortness of breath     being related to COPD or his CHF.  It should be noted that at the time of     discharge the patient had significantly increased air movement, increased     crackles, and overall signs of improving oxygenation.  Initially the     patient was placed on oxygen, however, at the time of discharge the     patient did not require oxygen and was ambulating well with saturations     at or above 90%.  4. CORONARY ARTERY DISEASE.  Patient with significant past medical history     of coronary artery disease including myocardial infarction, CABG, stent     placement x5, MI x6.  Given patient's significant history of coronary     artery disease the patient was placed on preventive medication including     metoprolol, Imdur, and Lasix.  The patient will continue to be at high     risk for further coronary events given his history.  Patient to be     followed as an outpatient to manage his coronary artery disease.  At the     time of discharge patient was undergoing anticoagulation with an INR sub     therapeutic at 1.5.  Patient is thought to be on anticoagulation     secondary to his CHF.  At the time of discharge patient's Coumadin was     adjusted, increasing his Wednesday dose by 2.5 mg.  Patient's dosing for     Coumadin is as listed above.  Again, patient will need follow up to     manage his coronary artery disease.   INSTRUCTIONS FOR PRIMARY MEDICAL DOCTOR:  1. Patient will require an INR check on day of appointment.  Coumadin     recently adjusted and INR was sub therapeutic on discharge.  During     follow  up appointment please address the issue of patient's CHF     medication.  Specifically, there was concern about the addition of an ACE     inhibitor, digoxin, or spironolactone.  At the time of discharge the     attending physician felt that these medications will be important to the     patient's long term management of his CHF.  2. COPD medications.  Please verify or stress to the patient that it is     important for him to follow up with Dr. Raymondo Band to simplify his COPD     treatment regimen.  Patient's current regimen is  redundant and most     likely difficult to comply with.     France Ravens                                Noelle C. Merilynn Finland, M.D.    EL/MEDQ  D:  05/19/2002  T:  05/19/2002  Job:  [id number]   cc:   Maurice March, M.D.  [facility]. Med - Resident - [place], [place] [address]  Fax: [phone]

## 2010-08-09 NOTE — Op Note (Signed)
[place]  Patient:    [name], [name]                      MRN: [id number] Proc. Date: 03/02/00 Adm. Date:  [id number] Attending:  Twana First                           Operative Report  PREOPERATIVE DIAGNOSIS: Right ulnar neuritis.  POSTOPERATIVE DIAGNOSIS: Right ulnar neuritis.  OPERATION/PROCEDURE: Right ulnar nerve neurolysis and anterior subcutaneous ulnar nerve transposition.  SURGEON: Elana Alm. Thurston Hole, M.D.  ASSISTANT: Kirstin A. Shepperson, P.A.  ANESTHESIA: Regional.  OPERATIVE TIME: Operative time was 45 minutes.  COMPLICATIONS: None.  INDICATIONS FOR PROCEDURE: [name] is a 70 year old gentleman who has had six to eight months of increasing right ulnar neuritis pain, with EMG and PNCVs documenting ulnar neuritis compression at the elbow.  He had had a previous ulnar neurolysis in situ three to four years ago done by Dr. Cleophas Dunker, but his symptoms have recurred.  He has failed conservative care and he is now to undergo neurolysis and anterior nerve transposition.  DESCRIPTION OF PROCEDURE: [name] was brought to the operating room on March 02, 2000 after a regional anesthetic had been placed in the holding room.  He was placed on the operating room table in the supine position and his right elbow was examined under anesthesia.  He had full range of motion in his elbow with stable ligamentous examination.  The right arm was prepped using sterile Betadine and draped using sterile technique.  The arm was exsanguinated and a tourniquet elevated to 300 mmHg.  Initially through his previous posterior/lateral incision over the elbow a new incision was made approximately 10-12 cm in length.  The underlying subcutaneous tissues were incised in line with the skin incision.  The fascial layers over the ulnar nerve were carefully incised.  The ulnar nerve was found proximally approximately four inches proximal to the  elbow and was carefully dissected distally.  The nerve was found to be encased in scar tissue and was carefully dissected and neurolysed from the scar tissue.  The nerve was completely neurolysed down distal approximately three to four inches distal to the elbow in the intermuscular septum and carefully freed up there as well.  The muscular branch off the nerve at the level of the elbow had to be sacrificed per routine in order to transpose the nerve.  After the neurolysis had been carried out the nerve could be easily transposed anteriorly.  A subcutaneous bed was made for the nerve on the anterior aspect of the medial epicondyle and the nerve was transferred anteriorly to this new bed, and then the fascial layer over the nerve was closed back to the medial epicondyle with multiple 0 Vicryl sutures.  After this was done there was found to be full range of motion of the elbow without any impingement on the nerve in its new position. At this point the wound was irrigated and then closed using 2-0 Vicryl and 3-0 Prolene.  Steri-Strips were applied and sterile dressings and a long-arm splint were applied and the tourniquet released.  The patient was then awakened and taken to the recovery room in stable condition.  FOLLOW-UP CARE: [name] will be followed as an outpatient, on Vicodin for pain.  He is to see me back in my office in a week for suture removal and follow-up. DD:  03/02/00  TD:  03/02/00 Job: [id number]

## 2010-08-09 NOTE — Discharge Summary (Signed)
[name], [name]               ACCOUNT NO.:  [id number]   MEDICAL RECORD NO.:  [id number]          PATIENT TYPE:  INP   LOCATION:  2040                         FACILITY:  [facility]   PHYSICIAN:  Cassell Clement, M.D. DATE OF BIRTH:  [birth year]   DATE OF ADMISSION:  03/17/2005  DATE OF DISCHARGE:  03/19/2005                                 DISCHARGE SUMMARY   FINAL DIAGNOSES:  1.  Acute subendocardial myocardial infarction.  2.  Congestive heart failure.  3.  Chronic obstructive pulmonary disease.  4.  Coronary atherosclerosis.  5.  Status post coronary artery bypass graft.  6.  Hypertensive cardiovascular disease.  7.  Hyperlipidemia.  8.  Exogenous obesity.  9.  Adult onset noninsulin-dependent diabetes mellitus.  10. Chronic Coumadin anticoagulation.  11. Venous insufficiency.   OPERATIONS PERFORMED:  Left heart cardiac catheterization on March 17, 2005 by Dr. Lacretia Nicks. Viann Fish.   HISTORY:  This is a 70 year old married Caucasian gentleman admitted to  [facility] because of acute onset of chest pain. The patient has a  past history of extensive cardiac disease. He underwent coronary artery  bypass graft surgery in 1992. In 1999, he underwent stenting of the  saphenous vein graft to the circumflex due to acute closure. In 2002, he had  also undergone stenting of the saphenous vein graft to the right coronary  artery due to acute closure. On the morning of admission, Christmas morning,  he awoke at approximately 3 a.m. with chest pain. This was described as  substernal pressure without radiation but was associated with dyspnea and no  diaphoresis or nausea. He came by EMS to the emergency department at which  time the cardiac cath team was summoned. He has multiple other medical  problems including hypertension, dyslipidemia, COPD, obesity and noninsulin  dependent diabetes mellitus.   MEDICATIONS ON ADMISSION:  Hydrochlorothiazide, Altace, isosorbide  mononitrate, potassium, torsemide, aspirin, alprazolam,  Hydrocodone/acetaminophen, metoprolol, Lexapro, Coumadin, Lipitor,  Glucovance and Zantac.   ALLERGIES:  The patient is allergic to VALIUM.   His physical exam is as noted by Dr. Donnie Aho on admission. The patient was  admitted and was taken directly to the cardiac catheterization lab by Dr.  Donnie Aho. The patient underwent uneventful cath showing left ventricular  function with an ejection fraction of 40% and inferior akinesis. The left  main was 30% distally stenosed. LAD 30% proximal stenosis. Circumflex and  right coronary arteries were 100% blocked with collaterals going to the  right from the left circumflex. He was also found to have saphenous vein  graft to the obtuse marginal occluded, saphenous vein graft to the right  coronary artery occluded, and the LIMA to the LAD was occluded. It was found  that the patient apparently had a new occlusion of the saphenous vein graft  to the right. It was felt that this could be chronic occlusion. No  interventions were done. It was felt that he would best be managed  medically. It was not felt that he was a candidate for redo CABG secondary  to multiple comorbidities. The patient was treated medically. His  enzymes  were only minimally elevated. He was maintained on Coumadin and dosing was  per pharmacy. He had no further chest discomfort. Rhythm remained stable  sinus rhythm. His enzymes showed only minimal elevation consistent with a  small non-Q-wave subendocardial myocardial infarction with a troponin going  from 0.06 to 0.32. He had a follow-up electrocardiogram on March 18, 2005  showed less ST depression in V4-V6. Blood sugar 221 down to 138 showed  better diabetic control. By March 18, 2005, he was stable enough to be  transferred to telemetry for ambulation and cardiac rehab. He ambulated well  total of 550 feet with minimal assistance. He had no further chest  discomfort.  Oxygen saturations remained stable at 93% and he was able to be  discharged home improved on March 19, 2005. His medical follow-up will be  with Dr. Rae Lips at [facility]. He will also be followed by [facility] at  home. Coumadin is followed in the Coumadin [facility] by Dr. Rae Lips.   DISCHARGE MEDICINES:  1.  Coumadin 5 milligrams Wednesday and Saturday, 7.5 milligrams other days      or as directed by Dr. Rae Lips.  2.  Lipitor 40 milligrams daily.  3.  Lopressor 50 milligrams twice a day.  4.  Senokot one daily.  5.  Plavix 75 milligrams daily.  6.  Demadex 20 milligrams twice a day.  7.  Imdur 60 milligrams daily.  8.  Advair 250/50 discus 1 puff twice a day.  9.  Spirolactone 100 milligrams daily.  10. Ecotrin 81 milligrams daily.  11. Nitrostat 1/50 p.r.n.  12. Altace 5 milligrams daily.  13. Atrovent inhaler 2 puffs 4 times a day.  14. Glucotrol XL 5 milligrams daily.   The patient will be seen in follow-up by Dr. Rae Lips in one week. He will see  Dr. Patty Sermons in one month. Coumadin will be followed up at Dr. Carma Lair  office. He will be on a low calorie, low salt, weight reduction diet. He has  a leg ulcer which will be treated with saline wet-to-dry compresses.   ACCESSORY LABORATORY DATA:  Hemoglobin was 11.1 on discharge, white count  was 10.8 on discharge. INR on admission was 3.2 with a pro time of 32.8. At  discharge, INR was 1.8 with a pro time of 21.4. Blood sugars were 221 and  138, albumin was 2.9. Liver functions were otherwise normal and his chest x-  ray showed evidence of COPD and accentuated interstitial markings  accentuated by shallow inspiration.   CONDITION ON DISCHARGED:  Improved.           ______________________________  Cassell Clement, M.D.     TB/MEDQ  D:  04/24/2005  T:  04/25/2005  Job:  [id number]   cc:   Rae Lips, M.D.  [facility]

## 2010-08-09 NOTE — Procedures (Signed)
[place]  Patient:    [name], [name]                      MRN: [id number] Proc. Date: 04/09/00 Adm. Date:  [id number] Attending:  Charna Elizabeth CC:         Gwenlyn Perking, M.D.                           Procedure Report  DATE OF BIRTH:  1940/04/24.  REFERRING PHYSICIAN:  Gwenlyn Perking, M.D.  PROCEDURE PERFORMED:  Colonoscopy with snare polypectomy x 2.  ENDOSCOPIST:  Anselmo Rod, M.D.  INSTRUMENT USED:  Olympus video colonoscope.  INDICATIONS FOR PROCEDURE:  Polyp seen on flexible sigmoidoscopy done for routine colon cancer screening and polypectomy planned.  Patients Plavix and aspirin were held for a week prior to the procedure.  PREPROCEDURE PHYSICAL:  The patient had stable vital signs.  Neck supple. Chest clear to auscultation.  S1, S2 regular.  Abdomen soft with normal abdominal bowel sounds.  DESCRIPTION OF PROCEDURE:  The patient was placed in the left lateral decubitus position and sedated with 40 mg of Demerol and 4 mg of Versed intravenously.  Once the patient was adequately sedated and maintained on low-flow oxygen and continuous cardiac monitoring, the Olympus video colonoscope was advanced from the rectum to the cecum with slight difficulty secondary to residual stool in the colon.  A 5 mm sessile polyp was noted at 20 cm and at 30 cm.  These were removed by snare polypectomy. There was no bleeding from the polypectomy site.  There was a large amount of residual stool in the colon.  Multiple washes were done.  No large masses or polyps were seen.  Very small lesions may have been missed, however.  The procedure was complete up to the cecum.  The appendicular orifice and ileocecal valve were clearly visualized and photographed.  The patient tolerated the procedure well without complication.  IMPRESSION: 1. Two colonic polyps removed from the left colon by snare polypectomy. 2. Large amount of residual stool  in the colon.  No other masses or polyps    seen.  RECOMMENDATIONS: 1. Await pathology results. 2. Hold off on Plavix and aspirin for the next 48 hours. 3. Outpatient follow-up in the next two weeks.DD:  04/09/00 TD:  04/09/00 Job: [id number]
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
## 2010-08-09 NOTE — Discharge Summary (Signed)
NAME:  [name], [name]                         ACCOUNT NO.:  [id number]   MEDICAL RECORD NO.:  [id number]                   PATIENT TYPE:  INP   LOCATION:  3315                                 FACILITY:  [facility]   PHYSICIAN:  [name], M.D.                  DATE OF BIRTH:  11-13-1940   DATE OF ADMISSION:  04/06/2003  DATE OF DISCHARGE:  04/07/2003                                 DISCHARGE SUMMARY   DISCHARGE DIAGNOSES:  1. Atypical angina/coronary artery disease.  2. Congestive heart failure.  3. Chronic obstructive pulmonary disease.  4. Obstructive sleep apnea.  5. History of cerebrovascular accident.   DISCHARGE MEDICATIONS:  1. Aspirin 81 mg p.o. daily.  2. Aldactone 50 mg p.o. daily.  3. Altace 5 mg p.o. daily.  4. Potassium chloride 20 mEq p.o. daily.  5. Furosemide 80 mg p.o. daily.\  6. Lipitor 40 mg p.o. daily.  7. Plavix 75 mg p.o. daily.  8. Protonix 40 mg p.o. daily.  9. Effexor 70 mg p.o. daily.  10.      Advair Diskus 250/50 one puff b.i.d.  11.      Albuterol 2 puffs q.4h.  12.      Atrovent nebulizer q.4h. with albuterol.  13.      Imdur 60 mg p.o. daily.  14.      Metoprolol 25 mg p.o. b.i.d.  15.      Nitroglycerin 0.4 mg sublingual p.r.n. for anginal chest pain.  16.      Xanax 0.25 mg b.i.d. p.r.n. for anxiety.   HISTORY OF PRESENT ILLNESS:  This is a 70 year old white male with a history  of severe three-vessel coronary artery disease who presented to [facility]  Emergency Department on April 06, 2003, with positive chest tightness that  had begun earlier that afternoon while around the house.  The patient stated  that at that time the pain resolved somewhat with administration of 1  sublingual nitroglycerin tablet but did not resolve completely.  The patient  took a second sublingual nitroglycerin and came to the ER.  At that time, he  denied diaphoresis, palpitations, radiation of pain.  He states that the  pain was like his normal angina in the  past but that it lasted longer.  He  denied any increased work of breathing or dyspnea on exertion.  He denied  having missed any of his formal medications.  He stated, though, that he had  been anxious about moving and that his stress level in his life had gone up.  He denied fever or chills.  No URI symptoms.  No rashes.  Minimal diarrhea  the day of presentation.   ADMISSION PHYSICAL EXAMINATION:  VITAL SIGNS:  Temperature 98.4, pulse 117,  blood pressure 139/88, respirations 24, 96% on room air.  GENERAL:  Mildly anxious, obese, middle-aged white male alert and oriented x  3, lying flat on  the stretcher.  NECK:  No JVD.  LUNGS:  Fine basilar crackles, left greater than right.  No wheezes.  The  patient had good respiratory effort at that time.  CARDIAC:  Heart sounds distant, but he had no murmurs.  He did have  laterally displaced PMI and 2+ lower extremity pain and edema bilaterally.  ABDOMEN:  Benign.  NEUROLOGIC:  Intact.   LABORATORY DATA:  White count 10.4, hemoglobin 13.3, hematocrit 38.7,  platelets 219.  Sodium 140, potassium 4.1, chloride 105, bicarb 28, BUN 14,  creatinine 0.9, glucose 122.  INR 2.3, PT 20.5, PTT 41, myoglobin 101 to 77.  CK-MB 4.0, dropping to 2.3.  Troponin less than 0.05 x 2.  The above are  point-of-care enzymes done in the ER.  B-natruretic peptide is 144. D-dimer 0.41.   Chest x-ray showed cardiomegaly, chronic scarring in the left lung base.   HOSPITAL COURSE:  1. ATYPICAL ANGINA:  The patient's history is not consistent with anginal     chest pain given the prolonged duration.  Point-of-care enzymes were     negative x 3 in the ER.  A third set of cardiac markers was negative on     day #2 of hospital admission.  The patient admits to increased anxiety     and stress in his life, opening up the possibility that his current chest     pain could be due anxiety at least in part.  The patient had recent     history of Cardiolite stress test in  December 2004.  Given his extensive     cardiac history, it is felt that very little intervention would be     possible even if the patient did have ischemic change.  His EKG showed no     new EKG findings but old inferolateral infarct.  The patient was started     on IV nitroglycerin drip in the emergency room by emergency room     physician and was weaned off of this successfully in short course     following admission to stepdown unit.  The patient failed to demonstrate     any additional chest pain following admission to stepdown unit and was     deemed eligible for outpatient management by his primary cardiologist,     Dr. Patty [name].  Appointment scheduled for January 24 at 2:30 p.m. for     this patient with Dr. Patty [name].   #2.  CONGESTIVE HEART FAILURE:  The patient had lower extremity edema at  time of admission.  No change on chest x-ray.  Normal D-dimer and normal  BNP.  He was not in respiratory distress.  His home medicines, ACE,  Aldactone, metoprolol, and Imdur were continued during his stay, although  Imdur was not started until day #2.  The patient was given Lasix, and lower  extremity edema resolved on day #2 of his stay.  Lungs were clear at the  time of discharge.  The patient was discharged on his home medications.   #3.  CHRONIC OBSTRUCTIVE PULMONARY DISEASE:  The patient's O2 saturation  remained stable on 2 liters of oxygen by nasal cannula which is his baseline  at home.  He had no change in sputum.  He was maintained on albuterol and  Atrovent.  He was able to ambulate prior to discharge without angina or  dyspnea.  The patient was discharged on his home medications as listed in  Discharge Medications above.   #4.  OBSTRUCTIVE  SLEEP APNEA:  The patient was maintained on CPAP overnight  because that is what he uses at home, and he faired quite well.  #5.  HISTORY OF CEREBROVASCULAR ACCIDENT:  The patient was not on Coumadin  while in the hospital, although his  INR was therapeutic.  This is an issue  that should be followed up by his primary care [name] or by Dr. Patty [name].   #6.  DISPOSITION:  The patient was discharged on April 07, 2003, with the  knowledge that he will be following up with Dr. Patty [name], cardiologist, on  April 17, 2003, at 2:30 p.m., and that he needs to return to his primary  care Owais Pruett or the emergency room if he experiences this chest pain again.      Franklyn Lor, MD                            [name], M.D.    TD/MEDQ  D:  04/07/2003  T:  04/08/2003  Job:  [id number]   cc:   Cassell Clement, M.D.  [address]  [place]  [place] [address]  Fax: [phone]   Toney Rakes, M.D.  [facility]

## 2010-08-09 NOTE — Consult Note (Signed)
[place]. [facility]  Patient:    [name], [name]                      MRN: [id number] Proc. Date: 05/11/00 Adm. Date:  [id number] Attending:  Doneta Public CC:         Alvia Grove., M.D.   Consultation Report  REFERRING PHYSICIAN:  Alvia Grove., M.D.  REASON FOR CONSULTATION:  Severe native 3 vessel coronary artery disease and saphenous vein graft of the disease, status post acute occlusion of the right coronary artery vein graft following angioplasty and stenting today.  CLINICAL HISTORY:  This patient is a 70 year old white male with a history of multiple cardiovascular risk factors, who has a history of coronary disease and is status post coronary artery bypass surgery in 1991.  The old operative note is not available at this time.  The patient reportedly had a repeat catheterization in 1999, and then had acute closure of the left circumflex vein graft that was successfully treated the following day by stenting.  For the past week the patient has reportedly had chest pain off and on relieved by sublingual nitroglycerin.  These episodes have been relatively short and promptly relieved with sublingual nitroglycerin.  He was admitted on May 08, 2000, and ruled out for myocardial infarction with negative enzymes.  An electrocardiogram showed sinus bradycardia but no acute changes.  He did have some mild chest pain while in the hospital.  He subsequently underwent cardiac catheterization today by Dr. Elease Hashimoto which showed mild irregularities of the left main coronary artery in the LAD.  The left circumflex and right coronary arteries were occluded.  The saphenous vein graft to the obtuse marginal was occluded.  The saphenous vein graft to the right coronary artery had mild diffuse irregularity and then had a severe 90% stenosis of the anastomosis distally just before the bifurcation.  There is also about 90% stenosis of the  proximal portion of the posterior descending artery.  Left ventricular function was mildly decreased.  Subsequently underwent PTCA and stenting of the lesion at the distal anastomosis.  The resulting degree of stenosis was insignificant.  He also had stenting of the PDA stenosis from 90% to 10%.  Post procedure the patient developed acute chest pain with diaphoresis and electrocardiogram showed inferior ST elevation with ST depression inferiorly consistent with inferior ischemia.  He was taken back to the catheterization lab and relook catheterization showed acute closure of the saphenous vein graft to the right coronary artery proximally.  This was subsequently treated with PTCA and placement of 3 stents over a long area.  This resulted in good repair of the proximal dissection.  There was still some distal narrowing and dissection beyond the distal portion of the stent that extended into the posterior descending artery.  The posterolateral branch that was seen well on the initial angiogram appeared occluded after the repeat catheterization and stenting.  I was called to evaluate the patient for possible redo coronary bypass surgery in the near future.  In the holding area the patient has had mild persistent chest pain rated at 2/10.  An electrocardiogram showed normal sinus rhythm at a rate of 60 with inferior posterior infarct and some lateral ST and T wave abnormality.  PAST MEDICAL HISTORY: 1. Significant for coronary disease as mentioned above status post coronary    artery bypass graft surgery. 2. He has a history of stroke in the past and  is status post left carotid    endarterectomy. 3. He has a history of COPD treated with bronchodilators. 4. He has a history of hypercholesterolemia. 5. He has a history of chronic neck pain and is status post cervical disk    surgery. 6. He is status post colonoscopy with polyp removal in January of 2002.  REVIEW OF SYSTEMS:   Constitutional:  He denies fevers or chills.  He has had about a 15 pound weight gain over the past couple of months.  Eyes:  Negative. ENT:  Negative.  Cardiovascular:  As above.  He denies PND or orthopnea.  He does have some exertional dyspnea that comes and goes.  Respiratory:  He denies cough or sputum production.  GI:  He has had frequent nausea.  He denies dysphagia.  He has had no melena but has had some bright red blood per rectum.  GU:  He denies dysuria or hematuria.  Neurologic:  He denies focal weakness or numbness.  He denies dizziness or syncope.  ALLERGIES:  VALIUM.  PSYCHIATRIC:  He has a history of anxiety.  SOCIAL HISTORY:  He is disabled.  He is married and lives with his wife.  He smokes 1 pack of cigarettes per day.  FAMILY HISTORY:  Noncontributory.  MEDICATIONS ON ADMISSION: 1. Alprazolam 1 mg p.o. b.i.d. 2. Aspirin 1 q. day. 3. Atenolol 25 mg q. day. 4. Cardura 2 mg q.h.s. 5. Combivent inhaler 1-2 puffs q.4-6h. p.r.n. 6. Effexor 75 mg b.i.d. 7. Lipitor 40 mg q. day. 8. Plavix 75 mg q. day.  PHYSICAL EXAMINATION:  VITAL SIGNS:  Blood pressure 145/65 and his pulse is 60 and regular. Respiratory rate is 16 and unlabored.  He is an obese white male in no distress.  HEENT:  Normocephalic and atraumatic.  Pupils, equal, round, reactive to light and accommodation.  Extraocular muscles are intact.  Throat is clear.  NECK:  Shows normal carotid pulses bilaterally.  There is a left neck scar from his carotid endarterectomy.  There is no bruit. There is no adenopathy or thyromegaly.  CARDIAC:  Shows regular rate and rhythm with no murmur, rub or gallop.  LUNG:  Reveals distant breath sounds.  There is a faint expiratory wheeze. There is an old median sternotomy scar.  ABDOMEN:  Shows active bowel sounds.  His abdomen is obese, and nontender. There are no palpable masses and no organomegaly.  EXTREMITY:  Shows no peripheral edema.  There is saphenous vein  harvest incisions in both legs on the right from the groin to the ankle and from the  left groin to the knee.  There are chronic venous changes in his left lower leg.  Pedal pulses are palpable bilaterally.  NEUROLOGIC:  Shows him to be alert and oriented x 3.  There are no focal changes.  SKIN:  Warm and dry.  IMPRESSION:  Mr. Sotelo has severe native 3 vessel coronary disease and saphenous vein graft disease status post coronary bypass surgery in 1991.  He presented with unstable angina secondary to high grade stenosis of the right coronary vein graft and is status post angioplasty and stenting followed by acute closure with acute inferoposterior infarct.  This was successfully opened with multiple stents.  He is stable at this time.  Given his relatively young age, he may be a candidate for redo coronary bypass surgery to revascularize the right coronary territory as well as the left circumflex territory if possible.  I would not recommend performing this acutely in  the setting of an acute myocardial infarction especially under the current circumstances with him being in a redo CABG, probable need for a right internal mammary artery and radial artery grafts since there may be no usable saphenous vein in the left lower leg, and the fact that he has an increased risk for complications given his history of ongoing smoking and COPD, obesity, and diffuse vascular disease.  We will follow the patient and try to make a determination of the appropriate time to perform surgical revascularization. DD:  05/11/00 TD:  05/12/00 Job: [id number]

## 2010-08-09 NOTE — Procedures (Signed)
NAMEANGELA, [name]               ACCOUNT NO.:  [id number]   MEDICAL RECORD NO.:  [id number]          PATIENT TYPE:  OUT   LOCATION:  SLEEP CENTER                 FACILITY:  [facility]   PHYSICIAN:  Coralyn Helling, MD        DATE OF BIRTH:  11/10/1940   DATE OF STUDY:  03/03/2006                            NOCTURNAL POLYSOMNOGRAM   INDICATIONS:  This is an individual who had undergone an overnight  polysomnogram on June 16, 2002 which showed an apnea/hypopnea index of  22 and oxygen saturation nadir of 77%.  He was intolerant of CPAP and  had change in his weight.  He is referred to the sleep lab for a repeat  titration study.   MEDICATIONS:  Lexapro, oxycodone, Xanax, Zantac, Gas-X, Humalog,  Metoprolol, aspirin, isosorbide, Altace, Colace, Coumadin, Advair,  DuoNeb, torsemide, and Lipitor.   EPWORTH SCORE:  9.   SLEEP ARCHITECTURE:  Total recording time 430.5 minutes.  Total sleep  time was 368 minutes.  Sleep efficiency 85%.  Sleep latency 10.5  minutes.  REM latency 353.5 minutes which was prolonged.  The patient  observed in both the supine and nonsupine position.   RESPIRATORY DATA:  Average respiratory rate was 16.  The patient was  titrated from a CPAP pressure setting of 5 to 14 cmH2O.  At a CPAP  pressure of 14 cmH2O, the apnea/hypopnea index was reduced to 1.  At  this pressure setting, the patient was observed in both supine sleep and  REM sleep and snoring was eliminated.   OXYGEN DATA:  The baseline oxygenation was 91%. The oxygen saturation  nadir was 78%.  The patient required the addition of 2 L of supplemental  oxygen to maintain his oxygen saturations as he had persistent oxygen  desaturations in the absence of other respiratory events.   CARDIAC DATA:  The average heart rate was 71% and rhythm strip showed  normal sinus rhythm with occasional PVCs.   MOVEMENT/PARASOMNIA:  The periodic limb movement index was 35.9 which is  elevated.  The patient had one  bathroom trip.   IMPRESSION:  This is a CPAP titration study at a CPAP pressure setting  of 14 cmH2O.  The apnea/hypopnea index was reduced to 1 and the patient  was observed in both REM sleep and supine sleep at this pressure  setting.  The patient required the addition of 2 L of supplemental  oxygen as he had persistent oxygen desaturations inspite of having  control of his respiratory events.  He also had an elevation in his  periodic limb movement index.  The patient should be initiated on CPAP  of 14 cmH2O with 2 L of supplemental oxygen and then followed up with an  overnight  oximetry.  The patient could also be considered for a BiPAP titration  study to determine if assisted ventilation would improve his oxygenation  without the need for additional supplemental oxygen.      Coralyn Helling, MD  Diplomat, American Board of Sleep Medicine  Electronically Signed     VS/MEDQ  D:  03/11/2006 10:13:01  T:  03/11/2006 12:59:31  Job:  [id number]

## 2010-08-09 NOTE — Discharge Summary (Signed)
[place]. [facility]  Patient:    [name], [name]                      MRN: [id number] Adm. Date:  [id number] Disc. Date: [id number] Attending:  Doneta Public Dictator:   Andrey Spearman, M.D. CC:         Gwenlyn Perking, M.D.   Discharge Summary  DISCHARGE DIAGNOSES:  1. Coronary artery disease, status post inferior myocardial infarction.     Status post percutaneous transluminal coronary angioplasty x 2 of the     inferior saphenous vein graft during this hospitalization.  2. Hypercholesterolemia.  3. Hypertension.  4. Tobacco abuse.  5. Chronic obstructive pulmonary disease.  6. History of cerebrovascular accident.  DISCHARGE DIAGNOSES:  1. Coumadin 7.5 mg p.o. q.d.  2. Toprol XL 12.5 mg q.d.  3. Effexor 75 mg q.d.  4. Combivent 2 puffs q.i.d.  5. Lasix 20 mg q.d.  6. K-Dur 20 mEq q.d.  7. Lipitor 80 mg q.d.  8. Albuterol 2 puffs p.r.n.  9. Pepcid 20 mg b.i.d. 10. Xanax 0.25 mg b.i.d. 11. Nicoderm patch 1 change daily.  PROCEDURES DONE DURING THIS HOSPITALIZATION:  1. The patient underwent cardiac catheterization on May 11, 2000 and     had PTCA and stenting of the distal saphenous vein graft that went from     90% occlusion to 0% occlusion.  Later that day had redeveloped chest pain     and restenosis of that graft.  Went back cardiac catheterization and had     restenting of the proximal section of the graft.  2. Cardiolite, May 19, 2000, which showed a large area of inferolateral     scar with only small degree of possible reversal ischemia at the apex.  BRIEF ADMISSION HISTORY AND PHYSICAL:  This patient is a 70 year old, white male of Dr. Peter Minium, with a known history of coronary artery disease that is status post CABG in 1992.  He came in with a one-week history of chest pain, diaphoresis, and shortness of breath relieved by nitroglycerin.  He reported the onset of the pain could be at rest or with exercise.  It  was substernal in location and described as a heaviness.  With known risk factors for atherosclerotic coronary vascular disease of hyperlipidemia, hypertension, obesity, and tobacco abuse.  It was decided the patient needed to be admitted since initial exam showed the patient to be pale and diaphoretic with coarse breath sounds bilaterally, and tachypnea.  His heart was regular rate and rhythm.  No murmur.  He had 1+ pitting edema to the knees.  Given his history it was thought that he was in unstable angina and he was admitted to the [facility], placed on a telemetry unit, started on IV heparin, IV nitroglycerin, Plavix, a beta blocker, as well as an aspirin, and cardiology was consulted.  HOSPITAL COURSE:  #1 - CORONARY ARTERY DISEASE, STATUS POST MYOCARDIAL INFARCTION:  Cardiology was consulted on the patients admission to the floor.  They agreed with the medical management of heparin, aspirin, a beta blocker, Plavix and aspirin. They planned to do catheterization on Monday, February 18.  The patient underwent cardiac catheterization on February 18, with the results as reported above in procedures.  Status post catheterization, the patient became unstable with a falling blood pressure and decreased urine output.  The patient was therefore given conservative IV fluids so as to prevent flash edema, status  post inferior myocardial infarction.  He was also started on Dopamine in renal dose.  Over time, the patients blood pressure slowly improved as well as his urine output.  The patients creatinine never bumped throughout the hospitalization.  The patient continued on Dopamine and this was slowly weaned over the course of 3-4 days.  During the acute worsening of the patients symptoms, he also had decreased platelet count at 272, which also corrected throughout the hospitalization.  The patient was discontinued on the heparin while the platelet count was well, however,  this was quickly restarted as the platelet count recovered as the patient was at serious risk for thrombosis given his ongoing MI.  The patients blood pressure, however, recovered.  His urine output recovered.  Creatinine remained stable.  He was transferred to the regular floor and the Dopamine was discontinued.  The patients blood pressure throughout the hospitalization, however, remained fairly low with systolics in the low 100s.  The patient underwent Cardiolite to assess his candidacy for bypass grafting.  Given the results of the Cardiolite as noted above, it was felt that the patient should be treated with medical management only as there did not seem to appear to be a large area of reversal ischemia. The patient was followed closely throughout the hospitalization and as his bp. recovered was started on a conservative dose of the beta blocker of 12.5 mg q.d.  In addition, his Lipitor was increased to 80 mg q.d.  As an outpatient, we will need to watch his blood pressure on the beta blocker and consider increasing this as his blood pressure tolerates.  In addition, he will probably need a more aggressive lipid management as an outpatient.  The patient understands this and will agree to have his cholesterol rechecked in three months after increasing his dose of beta blocker.   #2 - HYPERCHOLESTEROLEMIA:  As addressed above.  The patients medicines were changed during the hospitalization and his fasting lipid profile showed an LDL cholesterol of 136.  The patients Lipitor was increased to 80 a day.  He will probably need an addition of the second lipid-lowering agent in the future.   #3 - TOBACCO ABUSE:  I had a long discussion with this patient.  Explained to him that quitting smoking would probably be the thing that would increase his quantity and quality of life the most at this time.  The patient was interested in quitting smoking on discharge and was discharged home with  a prescription for nicotine patches.  The patient will need continued  encouragement as an outpatient and he may continue his efforts at smoking cessation.  CONDITION ON DISCHARGE:  The patient is discharged in stable condition.  FOLLOWUP:  He will be following up with Dr. Peter Minium. DD:  06/01/00 TD:  06/01/00 Job: [id number]
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
## 2010-08-09 NOTE — Consult Note (Signed)
NAMEJOHNATHIN, VANDERSCHAAF NO.:  [id number]   MEDICAL RECORD NO.:  [id number]          PATIENT TYPE:  INP   LOCATION:  3711                         FACILITY:  [facility]   PHYSICIAN:  Lyn Records, M.D.   DATE OF BIRTH:  [birth year]   DATE OF CONSULTATION:  06/21/2006  DATE OF DISCHARGE:                                 CONSULTATION   REASON FOR CONSULTATION:  Preoperative clearance before peripheral  vascular surgery.   CONCLUSION:  1. Acute on chronic left lower extremity ischemia with prior history      of left femoral to superficial femoral artery bypass.  2. Coronary atherosclerotic heart disease with prior inferior and      lateral wall infarctions.      a.     Cardiac catheterization 02/2005 demonstrated a totally       occluded saphenous vein graft to the obtuse marginal, occluded       saphenous vein graft to the PDA, and atretic left internal mammary       graft to the LAD.  Native right and circumflex coronary arteries       were totally occluded.  The LAD was widely patent, supplying       collaterals.      b.     Decreased left ventricular function documented both by       cardiac catheterization and echocardiogram with EF 35% most       recently in 07/2005.      c.     Stable angina for the past 6-12 months.  3. Congestive heart failure, compensated with function class II [place]      [place] Heart Association classification.  4. Sleep apnea, obstructive.  5. Diabetes mellitus.  6. Hypertension.  7. Hyperlipidemia.  8. Chronic obstructive pulmonary disease.   RECOMMENDATIONS:  1. The patient is clinically stable from a cardiac standpoint and      cleared for vascular surgery.  He is at moderate increased risk for      cardiovascular complications (in the 2-6% range) due to moderate      stress surgery (peripheral vascular surgery), and his history of      COPD, coronary artery disease, diabetes mellitus, all of which are      currently stable.  He does not  have significant renal dysfunction      or CNS disease.  2. Continue cardiac vasoactive agents pre and postoperatively      including beta-blocker, Statin, ACE, and long-acting nitrates.      Diuresis may need to be intensified peri operatively if intravenous      fluid administration is given in any significant amount.  3. Follow BMP given the patient's normal baseline BMP pre op.  4. EKG post op.  5. Cassell Clement, M.D. will follow starting 06/22/2006.   COMMENTS:  The patient is 70 years of age and was admitted with a  several day history of left lower extremity foot pain and discoloration.  He was found to have an occluded popliteal artery on the left with poor  runoff.  He is  scheduled to have an extension of his left femoral to  superficial femoral artery bypass to include the distal popliteal  region.   The patient has 1-pillow orthopnea.  His breathing has not significantly  changed recently.  He continues to smoke cigarettes.  Angina occurs  several times per month and is relieved by sublingual nitroglycerin.  This has been a stable pattern.  He denies tachy-palpitations, has not  had syncope, and has had no neurological complaints.   MEDICATIONS ON ADMISSION:  Aspirin, isosorbide, Lipitor, metoprolol,  tartrate, Altace, furosemide, Coumadin, potassium, nitroglycerin, and  cyclobenzaprine.   HABITS:  Continues to smoke cigarettes.  Denies ethanol consumption.   ALLERGIES:  VALIUM.   FAMILY HISTORY:  Positive for coronary artery disease.   PHYSICAL EXAMINATION:  On examination, the patient is sitting at the  bedside.  Left foot is discolored, particularly the digits of that foot.  He is in no respiratory distress.  His rate is 20, heart rate is 100,  blood pressure 120/71, and he is afebrile.  His lungs are clear.  There  is no jugular vein distension  with the patient sitting at 90-degrees.  No carotid bruits are heard.  There is a scar from previous left carotid   endarterectomy.  Cardiac exam without gallop or rub, no click or murmur.  Abdomen is obese.  No obvious pulsatile masses.  Extremities show left  lower extremity discoloration, decreased pulses in both lower  extremities, absent pulses in the left foot.  Neuro grossly intact  without focal deficits.   LABORATORY FINDINGS:  EKG on admission 06/20/2006 demonstrates  nonspecific T-wave flattening, evidence of prior inferior infarction,  with early QRS transition consistent with posterior extension.  No acute  ST-T wave change.  Chest x-ray revealed cardiomegaly, no obvious  failure.   BNP is less than 100.  Creatinine is around 1.0, potassium level is 3.6,  blood sugar 176.  Hemoglobin 10.6.      Lyn Records, M.D.  Electronically Signed     HWS/MEDQ  D:  06/21/2006  T:  06/21/2006  Job:  [id number]   cc:   Cassell Clement, M.D.  Balinda Quails, M.D.  Dr. Serita Butcher

## 2010-08-09 NOTE — H&P (Signed)
[name], [name]               ACCOUNT NO.:  [id number]   MEDICAL RECORD NO.:  [id number]          PATIENT TYPE:  INP   LOCATION:  3711                         FACILITY:  [facility]   PHYSICIAN:  Georgina Quint. Plotnikov, MDDATE OF BIRTH:  [birth year]   DATE OF ADMISSION:  06/19/2006  DATE OF DISCHARGE:                              HISTORY & PHYSICAL   CHIEF COMPLAINT:  Pain in the left leg.   HISTORY OF PRESENT ILLNESS:  The patient is a 70 year old diabetic male  with multiple medical problems who presents with two weeks of increasing  pain in the left leg and foot worse over the past 48 hours.  Now the  symptoms prompted his ER visit since the color of the foot started to  change.  He was evaluated by Dr. Hart Rochester and underwent a left lower  extremity arteriogram.  The decision was made to have him have an  elective surgery for his ischemic leg on Monday or Tuesday.  We were  asked to admit the patient.   PAST MEDICAL HISTORY:  1. Coronary artery disease.  2. Diabetes.  3. Hypertension.  4. Congestive heart failure.  5. Cardiomyopathy.  6. Peripheral vascular disease.  7. Sleep apnea.  8. Arthritis.   ALLERGIES:  VALIUM.   CURRENT MEDICATIONS:  1. Cyclobenzaprine 10 mg p.r.n.  2. Potassium chloride 1 a day.  3. Isosorbide mononitrate 30 mg a day.  4. Lipitor 40 mg a day.  5. Metoprolol XL 12.5 mg a day.  6. Nitroglycerin 0.4 mg p.r.n.  7. Ramipril 2.5 mg daily.  8. Furosemide 40 mg daily.  9. Coumadin.  10.Aspirin 1 daily.   SOCIAL HISTORY:  He is retired from [employer] work.  Does not smoke  at present.  No alcohol.  He is married.   FAMILY HISTORY:  Positive for coronary disease and diabetes.   REVIEW OF SYSTEMS:  Multiple medical problems, worsening pain in the  left leg.  Sugar has been fairly well controlled.  Sleep apnea, no  allergic symptoms, no angina,  the rest of the 18-point review of  systems is negative.   PHYSICAL EXAMINATION:  He is in no acute  distress.  Temperature 97.2, blood pressure 116/71, heart rate 104, respirations  20, saturations 96% on room air.  HEENT:  Moist mucosa.  NECK:  Supple, no thyromegaly, no bruits.  LUNGS:  Clear, no wheezes or rales, coarse breath sounds.  HEART:  S1, S2, tachycardic.  ABDOMEN:  Soft, no organomegaly or masses.  LOWER EXTREMITIES:  Right without edema, slightly decreased pedal  pulses.  The left leg with brownish discolorations below the knees,  bluish discoloration starting the lower third of the ankle and down to  the toes, swollen slightly cold.  Sensory examination reveals spotty  painful areas, pulses absent from popliteal and down.  Cranial Nerves II-  XII nonfocal and normal.  Deep tendon reflexes are fairly symmetric.  He  is alert, oriented, and cooperative.   LABORATORY DATA:  Glucose 132, BUN 17, sodium 139, potassium 5.5,  creatinine 1.3, hemoglobin 13.8, white count 17.1, INR 1.8, D-dimer  product 1.49 elevated.   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   ASSESSMENT ON LIMB MOVEMENT:  1. Left lower extremity ischemia/pain.  Elective surgery scheduled for      next week.  Will keep anticoagulated for now here; pain control.  2. Peripheral vascular disease with left popliteal stenosis.  Plan as      above status post evaluation by Dr. Hart Rochester.  3. Anticoagulation.  Will ask pharmacy to manage Lovenox/Coumadin.  4. Congestive heart failure.  Will use intravenous Lasix.  Check brain      natruretic peptide.  Obtain chest x-ray.  5. Coronary artery disease, currently asymptomatic.  6. Type 2 diabetes.  Will use sliding scale and maintenance 70/30      insulin.  7. Obstructive sleep apnea.  Will use a continuous positive airway      pressure machine.  8. Chronic renal failure with hyperkalemia.  Will hold potassium.      Gentle intravenous hydration.  9. Chronic obstructive  pulmonary disease.  Doing well at present.      Georgina Quint. Plotnikov, MD  Electronically Signed     AVP/MEDQ  D:  06/20/2006  T:  06/20/2006  Job:  [id number]   cc:   Quita Skye. Hart Rochester, M.D.  Cassell Clement, M.D.

## 2010-08-09 NOTE — H&P (Signed)
[place]  Patient:    [name], [name] Visit Number: [id number] MRN: [id number]          Service Type: EMS Location: [place] Attending Physician:  Cathren Laine Dictated by:   Clovis Pu Patty Sermons, M.D. Admit Date:  07/29/2001   CC:         Gwenlyn Perking, M.D.  Rodney A. Chaney Malling, M.D.   History and Physical  HISTORY:  This is a 70 year old married Caucasian gentleman with known ischemic heart disease who is admitted with prolonged chest pain through the emergency room.  The patient has a history of coronary artery disease dating back to 1992 when he underwent coronary artery bypass graft surgery by Dr. Andrey Campanile.  He was readmitted in February 2002 with crescendo angina on the family practice service, was found to have stenosis of the saphenous vein graft to the right coronary artery and underwent stenting by Dr. Elease Hashimoto on May 11, 2000.  Later that same day, the patient became shocky and had to be taken back acutely to the lab where they found an acute closure of the stented area.  This was opened with a series of stents.  Post catheterization, however, his course was stormy with hypotension and elevated cardiac enzymes.  The patient made a gradual improvement and became more stable and pre-discharge had an adenosine Cardiolite which showed a large inferolateral scar with only a small amount of potentially reversible ischemia.  Therefore, it was felt that he would not need redo CABG at that time but that medical therapy including long-term Coumadin should be employed.  Risk factor modification including stopping smoking was also emphasized, and the patient actually did stop smoking in February 2002 as a result of his hospital experience.  The patient subsequently attended the cardiac rehabilitation program and has graduated from it.  Overall, the patient has done reasonably well over the past year. Dr. Elbert Ewings has followed him closely in  the family practice clinic and has managed his Coumadin.  Today, the patient felt fatigued and then this evening developed substernal pressure with left arm radiation.  He took nitroglycerin and extra aspirin at home with temporary relief and came to the emergency room.  HOME MEDICATIONS:  1. Tylox 5/500 one or two a day as needed for back pain.  2. Plavix 75 mg daily.  3. Furosemide 40 mg daily.  4. Coumadin 5 mg 5 days a week, 7.5 mg 2 days a week.  5. Lipitor 40 mg daily.  6. Advair Diskus 250/50 twice a day.  7. Combivent metered dose inhaler twice a day.  8. Effexor 75 mg daily.  9. Prevacid 30 mg daily. 10. Tylenol Arthritis p.r.n. 11. Home nebulizer with albuterol which he uses 4 times a day. 12. Also, he was seen in the [facility] Emergency Room on Jul 24, 2001, with     left-sided pulmonary type chest pain and was treated with doxycycline 100     mg b.i.d. cough syrup, both of which he is still taking.  Chest x-ray at that time did not show pneumonia.  FAMILY HISTORY:  His father died of a massive myocardial infarction at age 76. Mother died of a car wreck in her 31s.  He has a brother and sister with heart trouble.  PAST MEDICAL HISTORY:  He had a left hip replacement twice by Dr. Chaney Malling. He has had a left carotid endarterectomy.  He has had knee surgery three or four times.  He has had right shoulder  surgery, neck surgery, and right carpal tunnel surgery.  REVIEW OF SYSTEMS:  He has had no hematochezia, melena, constipation, or change in bowel habits.  Genitourinary reveals no recent symptoms. Respiratory reveals recent cough and symptoms of bronchitis.  The remainder of Review of Systems is negative in detail.  PHYSICAL EXAMINATION:  VITAL SIGNS:  Blood pressure 112/60, pulse 80 and regular, respirations normal.  HEAD AND NECK:  Pupils are equal and reactive, sclerae clear.  Mouth and pharynx normal.  Left carotid endarterectomy scar.  Jugular venous pressure  is normal.  LUNGS:  Clear to auscultation.  HEART:  Quiet precordium without murmur, gallop, rub, or click.  First and second heart sounds are normal.  ABDOMEN:  No hepatomegaly, masses, or tenderness.  EXTREMITIES:  Good peripheral pulses, no edema, no phlebitis.  DIAGNOSTIC DATA:  Electrocardiogram shows normal sinus rhythm, and there is apparent new ST segment elevation of a mild degree consistent with inferior wall injury.  This is in the same area of the myocardial infarction in February 2002.  Chest x-ray shows cardiomegaly with no CHF.  Initial labs include an INR of 1.1.  CK-MB 3.0, troponin 0.15.  CBC and CMET are satisfactory.  DIAGNOSTIC IMPRESSION: 1. Ischemic heart disease with probable extension of old inferior wall    myocardial infarction. 2. Status post coronary artery bypass graft surgery in 1992. 3. Status post percutaneous transluminal coronary angioplasty and stent    of saphenous vein graft to the right coronary artery with acute closure    and subsequent acute inferior wall myocardial infarction with hypotension    May 11, 2000. 4. Former cigarette smoker. 5. Hyperlipidemia. 6. Exogenous obesity.  DISPOSITION:  Conservative care at this point.  It appears that he has probably had a small inferior wall myocardial infarction.  At the present time, he is pain free in the emergency room after receiving one dose of morphine earlier, and he is on an IV nitroglycerin drip.  Of note is the fact that his prothrombin time was subtherapeutic with INR 1.1 on arrival, and this may be pertinent to the fact that he began having chest pain today.  We will use Lovenox until his prothrombin time is therapeutic again.  We will add a beta blocker and continue with "statins" and Plavix and will continue pulmonary toilet.  Serial enzymes and EKGs will be obtained. Dictated by:   Clovis Pu Patty Sermons, M.D.  Attending Physician:  Cathren Laine DD:  07/30/01 TD:   07/30/01 Job: [id number]
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
## 2010-08-09 NOTE — Op Note (Signed)
NAMELAMONE, FERRELLI               ACCOUNT NO.:  [id number]   MEDICAL RECORD NO.:  [id number]          PATIENT TYPE:  INP   LOCATION:  3316                         FACILITY:  [facility]   PHYSICIAN:  Balinda Quails, M.D.    DATE OF BIRTH:  1940/12/24   DATE OF PROCEDURE:  06/22/2006  DATE OF DISCHARGE:                               OPERATIVE REPORT   SURGEON:  Balinda Quails, M.D.   ASSISTANT:  Jerold Coombe, P.A.   ANESTHETIC:  General endotracheal.   ANESTHESIOLOGIST:  Kaylyn Layer. Michelle Piper, M.D.   PREOPERATIVE DIAGNOSIS:  1. Ischemic left foot.  2. Thrombosis left popliteal artery.   POSTOPERATIVE DIAGNOSES:  1. Ischemic left foot.  2. 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 Resection of the popliteal artery aneurysm.  2. Left tibial artery thrombectomy x3.  3. Harvesting of left upper extremity cephalic vein.  4. Revision of left femoral-popliteal bypass to femoral tibial bypass      with interposition reversed cephalic vein.   CLINICAL NOTE:  [name] is a 70 year old male with a long history  of peripheral vascular disease and coronary artery disease.  He  presented to the emergency department complaining of pain in his left  foot for approximately a week on June 20, 2006.  He underwent  arteriography which revealed patency of a left above-knee a femoral-  popliteal vein graft.  There was evidence of a possible pseudoaneurysm  of the distal anastomosis, thrombosis of the left popliteal artery and  single vessel peroneal runoff.   He underwent preoperative vein mapping which revealed greater saphenous  vein to be harvested from the right leg.  A short segment of greater  saphenous vein was still available in the left leg, which revealed  varicose changes.  The upper extremities revealed adequate cephalic  veins.   He was brought to the operating, at this time, for revascularization due  to severe ischemia of his  left lower extremity.   OPERATIVE PROCEDURE:  The patient was brought to the operating room in  stable hemodynamic condition.  He was placed under general endotracheal  anesthesia.  The left leg and left arm were prepped and draped in a  sterile fashion.   A longitudinal skin incision made through the scar in the left leg and  distal thigh along the sartorial groove.  Dissection carried down  through the subcutaneous tissue.  The area of aneurysm formation at the  distal anastomosis of the femoral popliteal vein bypass graft was  identified.  Proximal to this, the adductor muscles were swept off of  the aneurysm; and the distal portion of the fem-pop vein graft was  encircled with a vessel loop.   A second skin incision then made in the lower leg along the posterior  margin of the tibia.  Dissection was carried down through the  subcutaneous tissue.  The saphenous vein was identified.  This revealed  quite marked varicose changes.  Tributaries of the saphenous vein  ligated with 3-0 silk and divided.  Deep dissection  then carried through  the gastrocnemius fascia.  The popliteal fossa entered.  The distal left  popliteal artery identified and encircled with a vessel loop.  The  insertion of the soleus muscle was then taken down.  This exposed the  origin of the tibial arteries.  The anterior tibial, common peroneal,  and posterior tibial arteries were each encircled with vessel loops.  There was no significant tibioperoneal trunk.  There was is an aberrant  takeoff of the posterior tibial creating a true trifurcation.   The saphenous vein then further harvested in the left leg, this revealed  a poor quality vein which was fibrous distally.  Therefore, the left  cephalic vein in the arm was exposed.  This was a reasonably good  quality vein.  Moderately, thin-walled; however, of good size.  The vein  was harvested from the antecubital fossa up to the margin of the  deltoid.   Tributaries of vein ligated with 4-0 silk and divided.  The  vein ligated proximally and distally with 2-0 silk.  It was divided and  dilated with heparin saline solution in a reverse fashion.  It was a  reasonably good sized vein and quality.   The patient administered 7000 units heparin intravenously.  Adequate  circulation time permitted.  The left femoral-popliteal vein graft was  then controlled proximal to the aneurysm with a DeBakey clamp.  The  aneurysm was then opened.  This revealed a large true atherosclerotic  aneurysm of the popliteal artery.  There was a large amount of laminated  thrombus which was removed.  Backbleeding from the native vessel and  antegrade bleeding from the native vessel was controlled with oversewing  from within the aneurysm using running 4-0 Prolene suture.   A tunnel was then created between the gastrocnemius muscles posterior to  the knee and up to the exposed vein graft.  The cephalic vein was then  anastomosed end-to-end to the indwelling femoral-popliteal vein graft  with a running 6-0 Prolene suture for posterior wall and interrupted 6-0  Prolene suture for the anterior wall.  At completion of this, the vein  flushed with excellent flow present.  The vein then brought down through  the tunnel to the below-knee position.   The tibial vessels all controlled with bulldog clamps; and the popliteal  artery controlled proximally with the bulldog clamp.  A longitudinal  arteriotomy then made in the distal popliteal artery and extended out  onto the peroneal artery.  The anterior tibial was thrombectomized down  to approximately 20 cm.  A small amount of thrombus returned with fairly  good backbleeding.  The peroneal artery was thrombectomized throughout  its length down to the ankle; and a small of thrombus returned from the  proximal vessel.  The posterior tibial artery appeared to be occluded  just beyond its origin and could not be  thrombectomized.  The vein graft was then measured out and anastomosed end-to-side to the  distal popliteal artery out onto the peroneal artery using running 6-0  Prolene suture.  At completion of the distal anastomosis the vein graft  was flushed.  Clamps wee removed.  Excellent flow was present down into  the peroneal artery.  Collateral flow reconstituting Doppler signal in  the dorsalis pedis and posterior tibial.   The patient administered 30 mg protamine intravenously.  Adequate  hemostasis obtained.  The left arm incision was closed with a deep layer  of running 2-0 Vicryl suture, a superficial layer of running  3-0 Vicryl  suture and staples applied to the skin.   The above-knee incision closed with an interrupted 2-0 Vicryl suture in  the deep fascia, a running 2-0 Vicryl suture in the subcutaneous layer  and staples applied to the skin.  The below-knee incision closed with a  deep layer of running 2-0 Vicryl suture, superficial layer of running 3-  0 Vicryl suture.  Staples applied to skin.  The popliteal  fossa and the aneurysm bed were drained with a 15 round Blake drain,  exited inferiorly through the skin, and fixed to skin with 2-0 silk  suture.  The patient tolerated the procedure well.  There were no  apparent complications.  Transferred to recovery room in stable  condition.      Balinda Quails, M.D.  Electronically Signed     PGH/MEDQ  D:  06/22/2006  T:  06/23/2006  Job:  [id number]

## 2010-08-09 NOTE — Op Note (Signed)
[name], [name]               ACCOUNT NO.:  [id number]   MEDICAL RECORD NO.:  [id number]          PATIENT TYPE:  AMB   LOCATION:  ENDO                         FACILITY:  [facility]   PHYSICIAN:  Anselmo Rod, M.D.  DATE OF BIRTH:  [birth year]   DATE OF PROCEDURE:  05/08/2004  DATE OF DISCHARGE:                                 OPERATIVE REPORT   PROCEDURE PERFORMED:  Colonoscopy with snare polypectomy x2.   ENDOSCOPIST:  Charna Elizabeth, M.D.   INSTRUMENT USED:  Olympus video colonoscope.   INDICATIONS FOR PROCEDURE:  A 70 -year-old white male, with a personal  history of adenomatous polyps; undergoing a repeat colonoscopy for colonic  polyp surveillance purposes.  Rule out recurrent polyps.   PREPROCEDURE PREPARATION:  Informed consent was procured from the patient.  The patient was fasted for 8 hr prior to the procedure and prepped with a  bottle of magnesium citrate and a gallon of GoLYTELY the night prior to the  procedure.  Risks and benefits of the procedure, including a 10% misrate of  cancerous polyps were discussed with the patient as well.   PREPROCEDURE PHYSICAL:  VITAL SIGNS:  Stable.  NECK:  Supple.  CHEST:  Clear to auscultation and bilateral wheezing.  HEART:  S1, S2 regular.  ABDOMEN:  Obese with normal bowel sounds.  No hepatosplenomegaly.   DESCRIPTION OF PROCEDURE:  The patient was placed in the left lateral  decubitus position and sedated with 70 mg Demerol and  5 mg Versed in slow incremental doses.  Once the patient was adequately  sedated and maintained on low-flow oxygen and continuous cardiac monitoring,  the Olympus video colonoscope was advanced from the rectum to the cecum.  The appendiceal orifice and the ileocecal valve were visualized and  photographed after multiple washes.  There was significant amount of  residual stool in the right colon.  A small sessile polyp was snared from  the proximal right colon.  Another small sessile polyp was snared  from the  rectum.  There was no evidence of diverticulosis; no other masses or polyps  were seen.  Small lesions could have been missed, secondary to a relatively  poor prep.  Retroflexion in the rectum revealed no abnormalities.  The  patient tolerated the procedure well without immediate complications.   IMPRESSION:  1.  Two small sessile polyps snared from the colon; one from the proximal      right colon and one from the rectum (placed in two different bottles).  2.  Significant amount of residual stool in the colon; small lesions could      have been missed.  We did multiple washes.   RECOMMENDATIONS:  1.  Await pathology results.  2.  Avoid all nonsteroidals, including aspirin, for the next four weeks.  3.  Outpatient followup as the need arises in the future.      JNM/MEDQ  D:  05/08/2004  T:  05/08/2004  Job:  [id number]   cc:   Jon Gills, M.D.  Fax: [phone]
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
## 2010-08-09 NOTE — H&P (Signed)
NAME:  [name], [name]                         ACCOUNT NO.:  [id number]   MEDICAL RECORD NO.:  [id number]                   PATIENT TYPE:   LOCATION:                                       FACILITY:  [facility]   PHYSICIAN:  [id number]                                DATE OF BIRTH:  May 08, 1940   DATE OF ADMISSION:  12/01/2001  DATE OF DISCHARGE:                                HISTORY & PHYSICAL   CHIEF COMPLAINT:  Dyspnea and lower extremity edema.   HISTORY OF PRESENT ILLNESS:  The patient is a 70 year old white male with a  complicated cardiac history, COPD, and hypercholesterolemia, who presented  to the family practice clinic today with increased dyspnea over the past  week, which acutely worsened on Saturday, occasional cough, and severely  increased lower extremity edema and increased abdominal girth.  He does  complain of increased weight gain.  He denies any chest pain.  No nausea and  no diaphoresis.   REVIEW OF SYMPTOMS:  No fevers.  No headaches.  No vomiting or diarrhea.  No  bright red blood per rectum.  No dysuria or hematuria.  Otherwise as above.   PAST MEDICAL HISTORY:  1. Hypercholesterolemia.  2. Anxiety.  3. Coronary artery disease.  4. History of CVA.  5. COPD.  6. Reflux esophagitis.  7. BPH.  8. History of right-sided CHF.  9. History of myocardial infarctions x 6.   PAST SURGICAL HISTORY:  1. Status post CABG x 3.  2. History of carotid stent.  3. History of left hip replacement.  4. History of carotid endarterectomy.   MEDICATIONS:  1. Advair 250/50 mg one puff b.i.d.  2. Albuterol two puffs q.4h. p.r.n.  3. Celebrex p.r.n.  4. Peri-Colace q.d. p.r.n.  5. Combivent two puffs q.i.d.  6. Coumadin 7.5 mg every Sunday, Tuesday, Thursday, and Saturday and 5 mg on     Monday, Wednesday, and Friday.  7. Effexor 75 mg b.i.d.  8. Imdur 60 mg q.d.  9. Potassium 20 mEq q.d.  10.      Lasix 40 mg p.o. q.d.  11.      Lipitor 40 mg q.d.  12.      Metoprolol 25 mg  t.i.d.  13.      Nitroglycerin 0.4 mg sublingual p.r.n.  14.      Plavix 75 mg q.d.  15.      Protonix 40 mg q.d.  16.      Xanax 0.25 mg q.d. p.r.n.   ALLERGIES:  VALIUM.   SOCIAL HISTORY:  He quit smoking in February of 2002.  He has a 40-pack-year  history.  He denies any alcohol or illicit drugs.  He is married.   FAMILY HISTORY:  His father died of an MI at age 54.  His mother died in  a  motor vehicle accident.  His brother and sister both have cardiac disease.   OBJECTIVE:  VITAL SIGNS:  Temperature 97.0 degrees, respirations 12, blood  pressure 100/60, saturating 92% on room air.  GENERAL APPEARANCE:  He is dyspneic and in mild to moderate distress.  HEENT:  Pupils are equal, round, and reactive to light.  Extraocular muscles  were intact.  Tympanic membranes were clear bilaterally.  The oropharynx is  clear.  He is edentulous.  NECK:  No JVD.  No thyromegaly.  No lymphadenopathy.  LUNGS:  Scant crackles on the left base.  The right was clear.  He is  tachypneic.  CARDIOVASCULAR:  Regular rate and rhythm with no murmurs, rubs, or gallops.  ABDOMEN:  Obese.  Increased abdominal girth from previous visit.  EXTREMITIES:  +3 bilateral edema in lower extremities with venous stasis  skin changes.  NEUROLOGIC:  Grossly intact.  RECTAL:  Deferred.  WEIGHT:  His weight is 13 pounds up from four weeks ago.   LABORATORY DATA:  The EKG shows normal sinus rhythm with an old  inferoposterior MI, unchanged from prior EKG.  His INR is 2.2.  The CBC,  BMP, and chest x-ray are pending.   IMPRESSION:  This is a 70 year old with right-sided heart failure, coronary  artery disease, and COPD, who presents with lower extremity edema and  dyspnea with a 13-pound weight gain over the last four weeks.   ASSESSMENT AND PLAN:  1. Right-sided heart failure.  The patient has symptoms consistent with     right-sided failure.  Unsure what exacerbated this.  The patient does not     have any chest  pain, but with his extensive cardiac history, will rule     out MI.  Cycle enzymes.  Admit to telemetry bed.  Give morphine and     nitroglycerin p.r.n., O2, and aspirin.  Will recheck an EKG in the     morning.  The goal is diuresis to relieve his symptoms.  Will start with     IV Lasix.  Can increase the Lasix if the patient does not respond versus     change to Demadex since he does have probable bowel edema.  Other     etiologies include arrhythmia, valvular disease, and noncompliance with     diet or medications, however, the patient is very compliant.  An     echocardiogram in May of 2003 showed an EF of 40-50% with only mild     mitral regurgitation.  Also consider pulmonary embolism with the dyspnea.     This is unlikely in a patient who is anticoagulated.  2. Coronary artery disease.  He is on lifelong Coumadin for a history of MI     x 6.  He has had a recurrent MI and CVA after being taken off Coumadin.     Will continue his home medical regimen.  3. Chronic obstructive pulmonary disease.  Will place on his home regimen of     Combivent, Advair, and albuterol p.r.n.  4.     Hypercholesterolemia.  He is on Lipitor.  His LDL was 96 in December of     2002.  Will check liver functions since his liver enzymes have not been     looked at in the last six months since he was on Lipitor.  5. Anticipate discharge in the next day or two after he diureses     effectively.     Lisa A.  Rodman Pickle, MD                       2031    LAC/MEDQ  D:  12/03/2001  T:  12/05/2001  Job:  [id number]
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
## 2010-08-09 NOTE — Assessment & Plan Note (Signed)
[facility]                             PULMONARY OFFICE NOTE   CLIFFARD, HAIR                        MRN:          [id number]  DATE:05/07/2006                            DOB:          15-Aug-1940    I had reviewed the overnight oximetry result for Mr. [name] from  April 28, 2006 and this showed that he had 91% of the time with an  oxygen saturation greater than 90%.  He had 9% of the study with an  oxygen saturation between 85% - 89%, but only 1% of the study with an  oxygen saturation below 88%.  Therefore I do not believe that Mr Cozzolino  would need to have his supplemental oxygen continued at night, as it  appears that his oxygen desaturations at night have been alleviated with  the use of his CPAP machine.     Coralyn Helling, MD  Electronically Signed    VS/MedQ  DD: 05/07/2006  DT: 05/07/2006  Job #: [id number]

## 2010-08-09 NOTE — Assessment & Plan Note (Signed)
[facility]                             PULMONARY OFFICE NOTE   GARTH, DIFFLEY                      MRN:          [id number]  DATE:04/13/2006                            DOB:          1940-07-31    I saw [name] in followup today for his obstructive sleep apnea,  COPD, and hypoxemia and possible restless leg syndrome.  He continues to  use CPAP at 14 cm of water.  He says that he goes to bed at about 12:30,  and wakes up between 4:30 and 5:30.  He uses his CPAP machine for the  entire time that he is sleeping during this duration.  When he does use  his CPAP machine he feels like his sleep is significantly improved,  however, what happens is he will end up taking a nap 2-3 times a day for  approximately 1 hour each.  At those times he is not using his CPAP  machine.  With regards to his COPD, he says his breathing is under  reasonable control on his current regimen of Advair 250/50 one puff  b.i.d., and his DuoNeb nebulizer t.i.d. to q.i.d. as needed.   MAIN COMPLAINTS:  He is having stiffness in his right arm, as well as  pain and stiffness in his back and both of his knees.  His current  description really does not fit with restless leg syndrome, but is more  fitting for arthritis and possibly neuropathic pain related to his  chronic back problems.   PHYSICAL EXAMINATION:  He is 276 pounds, temperature 97.7, blood  pressure 116/68, heart rate is 87, oxygen saturation 93% on room air.  HEENT:  There is no sinus tenderness, no oral lesions, no  lymphadenopathy.  HEART:  With S1, S2.  CHEST:  There was no wheezing or rales.  ABDOMEN:  Was obese, soft, nontender.  EXTREMITIES:  There was minimal ankle edema.   IMPRESSION:  1. Obstructive sleep apnea.  I would have him continue on his CPAP at      14 cm of water.  He had stopped using his supplemental oxygen at      night because he said it was too expensive.  I had explained to him  that this would be covered with his insurance through Medicare.  To      further assess this I will have him undergo an overnight oximetry,      and then depending on the results of this, if in fact he does need      supplemental oxygen I would have him continue on this.  2. Chronic obstructive pulmonary disease.  He again is doing quite      well with regards to this on his current regimen of Advair 250/50      one puff b.i.d. and his DuoNeb nebulizer t.i.d. to q.i.d. as      needed.  And I would have him continue on this.  3. Arm, back, and leg pains.  Again, this does not really sound like      the classic description for restless leg  syndrome, and in fact may      be more related to arthritis and possibly neuropathic pains related      to his chronic back pain.  He is due to have followup with his      orthopedic surgeon, as well as Dr. Hetty Ely For this.  4. History of deep venous thrombosis and pulmonary embolism.  He is to      continue on Coumadin therapy for this.   I will see him in followup in approximately 4-6 months.     Todd Helling, MD  Electronically Signed    VS/MedQ  DD: 04/13/2006  DT: 04/13/2006  Job #: [id number]
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
## 2010-08-21 ENCOUNTER — Encounter: Payer: Self-pay | Admitting: *Deleted

## 2010-08-21 ENCOUNTER — Ambulatory Visit (INDEPENDENT_AMBULATORY_CARE_PROVIDER_SITE_OTHER): Payer: Medicare HMO | Admitting: *Deleted

## 2010-08-21 DIAGNOSIS — Z7901 Long term (current) use of anticoagulants: Secondary | ICD-10-CM

## 2010-08-21 DIAGNOSIS — Z8672 Personal history of thrombophlebitis: Secondary | ICD-10-CM

## 2010-08-21 LAB — POCT INR: INR: 1.9

## 2010-08-23 ENCOUNTER — Other Ambulatory Visit: Payer: Self-pay | Admitting: Family Medicine

## 2010-08-23 NOTE — Telephone Encounter (Signed)
Refill request

## 2010-09-09 ENCOUNTER — Other Ambulatory Visit: Payer: Self-pay | Admitting: Family Medicine

## 2010-09-09 ENCOUNTER — Telehealth: Payer: Self-pay | Admitting: Family Medicine

## 2010-09-09 NOTE — Telephone Encounter (Signed)
States that he has been waiting for his pain meds (Tylenol #2) since 6/1 and hasn't rec'd them yet.  Not sure what to do.  [facility]- [address]

## 2010-09-09 NOTE — Telephone Encounter (Signed)
Refill request

## 2010-09-09 NOTE — Telephone Encounter (Signed)
[name],  I called pt's pharmacy and they stated that they would fax over another request for pt's meds.  I also checked your box to be sure that it was not overlooked.[name] [place]

## 2010-09-10 ENCOUNTER — Other Ambulatory Visit: Payer: Self-pay | Admitting: Family Medicine

## 2010-09-10 NOTE — Telephone Encounter (Signed)
Spoke with patient and informed of below 

## 2010-09-10 NOTE — Telephone Encounter (Signed)
I refilled these today electronically.  Today was the first day they appeared in my box.  Thanks

## 2010-09-11 NOTE — Telephone Encounter (Signed)
Refilled electronically on 6/19, will not fill again.

## 2010-09-11 NOTE — Telephone Encounter (Signed)
Refill request

## 2010-09-18 ENCOUNTER — Ambulatory Visit (INDEPENDENT_AMBULATORY_CARE_PROVIDER_SITE_OTHER): Payer: Medicare HMO | Admitting: *Deleted

## 2010-09-18 DIAGNOSIS — Z7901 Long term (current) use of anticoagulants: Secondary | ICD-10-CM

## 2010-09-18 DIAGNOSIS — Z8672 Personal history of thrombophlebitis: Secondary | ICD-10-CM

## 2010-09-30 ENCOUNTER — Encounter (INDEPENDENT_AMBULATORY_CARE_PROVIDER_SITE_OTHER): Payer: Medicare HMO

## 2010-09-30 DIAGNOSIS — I739 Peripheral vascular disease, unspecified: Secondary | ICD-10-CM

## 2010-09-30 DIAGNOSIS — Z48812 Encounter for surgical aftercare following surgery on the circulatory system: Secondary | ICD-10-CM

## 2010-10-02 ENCOUNTER — Ambulatory Visit (INDEPENDENT_AMBULATORY_CARE_PROVIDER_SITE_OTHER): Payer: Medicare HMO | Admitting: *Deleted

## 2010-10-02 DIAGNOSIS — Z7901 Long term (current) use of anticoagulants: Secondary | ICD-10-CM

## 2010-10-02 DIAGNOSIS — Z8672 Personal history of thrombophlebitis: Secondary | ICD-10-CM

## 2010-10-02 LAB — POCT INR: INR: 1.6

## 2010-10-09 ENCOUNTER — Other Ambulatory Visit: Payer: Self-pay | Admitting: Family Medicine

## 2010-10-09 ENCOUNTER — Ambulatory Visit (INDEPENDENT_AMBULATORY_CARE_PROVIDER_SITE_OTHER): Payer: Medicare HMO | Admitting: *Deleted

## 2010-10-09 DIAGNOSIS — Z7901 Long term (current) use of anticoagulants: Secondary | ICD-10-CM

## 2010-10-09 DIAGNOSIS — Z8672 Personal history of thrombophlebitis: Secondary | ICD-10-CM

## 2010-10-09 LAB — POCT INR: INR: 2.8

## 2010-10-09 NOTE — Telephone Encounter (Signed)
Refill request

## 2010-10-14 ENCOUNTER — Ambulatory Visit (INDEPENDENT_AMBULATORY_CARE_PROVIDER_SITE_OTHER): Payer: Medicare HMO | Admitting: Family Medicine

## 2010-10-14 ENCOUNTER — Encounter: Payer: Self-pay | Admitting: Family Medicine

## 2010-10-14 DIAGNOSIS — J441 Chronic obstructive pulmonary disease with (acute) exacerbation: Secondary | ICD-10-CM

## 2010-10-14 DIAGNOSIS — I509 Heart failure, unspecified: Secondary | ICD-10-CM | POA: Insufficient documentation

## 2010-10-14 MED ORDER — PREDNISONE 50 MG PO TABS: 50.0000 mg | ORAL_TABLET | Freq: Every day | ORAL | Status: AC

## 2010-10-14 MED ORDER — DOXYCYCLINE HYCLATE 100 MG PO TABS: 100.0000 mg | ORAL_TABLET | Freq: Two times a day (BID) | ORAL | Status: AC

## 2010-10-14 NOTE — Assessment & Plan Note (Signed)
Combo CHF/COPD exac.   Treat with Prednisone, Doxy for COPD Treat with double dose of Lasix for CHF.   To return in 2 days for re-eval.

## 2010-10-14 NOTE — Progress Notes (Signed)
  Subjective:    Patient ID: [name], male    DOB: 08/07/1940, 70 y.o.   MRN: [id number]  HPI Patient here b/c wife concerned with his hearing and that his mood is deteriorating.  Per nursing, wife states that he is easy to anger and lashes out often.  However, today he has more pressing issue:  1.  Dyspnea on exertion:  Has been worsening for past 2-3 weeks.  Has gained 5-6 lbs per report in past several weeks, making his breathing more difficult.  Has been taking 40 mg Furosemide, but still feels he has increasing edema BL LE's as well as abdominal girth.  Pants fitting more tightly.  No chest pain, no palpitations.  Has nonproductive cough for past several days.  No fevers or chills.  No abd pain, nausea, vomiting.    2.  Trouble hearing:  Difficulty hearing out of Right ear.  This is new for him.  States he has trouble understanding wife or daughter in law when they speak to him due to hearing difficulties.  Has tried using Q-tips to clean out ears.     Review of Systems See HPI above for review of systems.       Objective:   Physical Exam Gen:  Alert, cooperative patient who appears stated age in mild distress.  Vital signs reviewed. Ears:  BL cerumen impaction, R > L.  Unable to appreciate TM's.  No evidence of otitis externa Neck: No masses or thyromegaly or limitation in range of motion.  No cervical lymphadenopathy.  No JVD noted Cardiac:  Regular rate and rhythm without murmur auscultated.  Good S1/S2. Lungs:  Coarse, rhonchorous breathing BL bases.  No crackles noted. Abd:  Obese.  +1 abdominal wall edema noted beneath pannus.  BS positive, nontender Ext:  +3 BL LE (baseline for patient is +1 to +2).  Chronic venous stasis changes noted       Assessment & Plan:

## 2010-10-14 NOTE — Patient Instructions (Signed)
I want you to take 2 of the fluid pills for the next week.   I am going to send in Prednisone and an antibiotic for you as well.   I want you to come back and see [place] on Wednesday to make sure you're feeling better.

## 2010-10-14 NOTE — Assessment & Plan Note (Signed)
Combo CHF/COPD exac.   Treat with Prednisone, Doxy for COPD Treat with double dose of Lasix for CHF.   To return in 2 days for re-eval.   

## 2010-10-15 NOTE — Procedures (Unsigned)
BYPASS GRAFT EVALUATION  INDICATION:  Followup bypass graft placement.  HISTORY: Diabetes:  Yes. Cardiac:  CABG, MI. Hypertension:  Yes. Smoking:  Previously. 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  SINGLE LEVEL ARTERIAL EXAM                              RIGHT              LEFT Brachial:                    140                144 Anterior tibial:             126                92 Posterior tibial:            146                77 Peroneal: Ankle/brachial index:        1.01               0.64  PREVIOUS ABI:  Date:  10/01/2009  RIGHT:  1.09  LEFT:  0.94  LOWER EXTREMITY BYPASS GRAFT DUPLEX EXAM:  DUPLEX:  Patent left fem-pop bypass graft with velocity measurements noted on the following worksheet.  IMPRESSION: 1. Right ankle brachial index within normal limits and stable from the     previous exam. 2. Left ankle brachial index is suggestive of moderate arterial     disease and has declined from the previous exam. 3. Patent left femoral-popliteal bypass graft.  ___________________________________________ V. Charlena Cross, MD  EM/MEDQ  D:  09/30/2010  T:  09/30/2010  Job:  [id number]

## 2010-10-16 ENCOUNTER — Encounter: Payer: Self-pay | Admitting: Family Medicine

## 2010-10-16 ENCOUNTER — Ambulatory Visit (INDEPENDENT_AMBULATORY_CARE_PROVIDER_SITE_OTHER): Payer: Medicare HMO | Admitting: Family Medicine

## 2010-10-16 DIAGNOSIS — J441 Chronic obstructive pulmonary disease with (acute) exacerbation: Secondary | ICD-10-CM

## 2010-10-16 DIAGNOSIS — I509 Heart failure, unspecified: Secondary | ICD-10-CM

## 2010-10-16 NOTE — Assessment & Plan Note (Signed)
Improved, will resume once a day lasix dosing, follow-up in 1 week with PCP

## 2010-10-16 NOTE — Patient Instructions (Signed)
Keep taking doxycycline and prednisone until it is done Make appointment for dr. Gwendolyn Grant next week to talk about your breathing It may be helpful for your to have a pulmonary function test, or start an inhaler You can review your medications with him at that time and prioritize them

## 2010-10-16 NOTE — Assessment & Plan Note (Addendum)
Advised to continue course of doxy and prednisone.  Discussed with patient need for at least a short acting bronchodilator- he declines stating cost as a barrier.  He is unsure if he has had a PFT.  Will refer him back to his PCP for follow-up in 1 week.  I would consider PFT and starting tx for COPD if patient agreeable.

## 2010-10-16 NOTE — Progress Notes (Signed)
  Subjective:    Patient ID: [name], male    DOB: 1940-09-13, 70 y.o.   MRN: [id number]  HPI Here for 2 days follow-up of 2 weeks of worsening SOB.  Was dx by PCP as combo of CHF and COPD exacerbation.  Patient has been taking doxycycline, prednisone, and double his dose of lasix.  He has lost 4 pounds since last visit. He feels his breathing is a little better.  Denies chest pain, fever, chills, PND.  I have reviewed patient's  PMH, FH, and Social history and Medications as related to this visit.  Review of Systems See hpi    Objective:   Physical Exam    GEN: Alert & Oriented, poor health literacy, obese, becomes mildly out of breath when walking. CV:  Regular Rate & Rhythm, no murmur Respiratory: rare end expiratory wheezes. Abd:  + BS, soft, no tenderness to palpation Ext: 1_ non pitting edema, chronic venous stasis changes     Assessment & Plan:

## 2010-10-18 ENCOUNTER — Inpatient Hospital Stay (HOSPITAL_COMMUNITY)
Admission: AD | Admit: 2010-10-18 | Discharge: 2010-10-21 | DRG: 281 | Disposition: A | Discharge status: Home Health | Payer: Medicare HMO | Source: Other Acute Inpatient Hospital | Attending: Cardiology | Admitting: Cardiology

## 2010-10-18 ENCOUNTER — Inpatient Hospital Stay (HOSPITAL_COMMUNITY): Payer: Medicare HMO

## 2010-10-18 DIAGNOSIS — Z7982 Long term (current) use of aspirin: Secondary | ICD-10-CM

## 2010-10-18 DIAGNOSIS — Z86718 Personal history of other venous thrombosis and embolism: Secondary | ICD-10-CM

## 2010-10-18 DIAGNOSIS — I252 Old myocardial infarction: Secondary | ICD-10-CM

## 2010-10-18 DIAGNOSIS — J449 Chronic obstructive pulmonary disease, unspecified: Secondary | ICD-10-CM | POA: Diagnosis present

## 2010-10-18 DIAGNOSIS — I509 Heart failure, unspecified: Secondary | ICD-10-CM | POA: Diagnosis present

## 2010-10-18 DIAGNOSIS — Z981 Arthrodesis status: Secondary | ICD-10-CM

## 2010-10-18 DIAGNOSIS — Z7901 Long term (current) use of anticoagulants: Secondary | ICD-10-CM

## 2010-10-18 DIAGNOSIS — J4489 Other specified chronic obstructive pulmonary disease: Secondary | ICD-10-CM | POA: Diagnosis present

## 2010-10-18 DIAGNOSIS — Z794 Long term (current) use of insulin: Secondary | ICD-10-CM

## 2010-10-18 DIAGNOSIS — R079 Chest pain, unspecified: Secondary | ICD-10-CM

## 2010-10-18 DIAGNOSIS — I4729 Other ventricular tachycardia: Secondary | ICD-10-CM | POA: Diagnosis present

## 2010-10-18 DIAGNOSIS — M79609 Pain in unspecified limb: Secondary | ICD-10-CM

## 2010-10-18 DIAGNOSIS — L02419 Cutaneous abscess of limb, unspecified: Secondary | ICD-10-CM | POA: Diagnosis present

## 2010-10-18 DIAGNOSIS — Z79899 Other long term (current) drug therapy: Secondary | ICD-10-CM

## 2010-10-18 DIAGNOSIS — L03119 Cellulitis of unspecified part of limb: Secondary | ICD-10-CM | POA: Diagnosis present

## 2010-10-18 DIAGNOSIS — I2582 Chronic total occlusion of coronary artery: Secondary | ICD-10-CM | POA: Diagnosis present

## 2010-10-18 DIAGNOSIS — I2119 ST elevation (STEMI) myocardial infarction involving other coronary artery of inferior wall: Secondary | ICD-10-CM

## 2010-10-18 DIAGNOSIS — I2589 Other forms of chronic ischemic heart disease: Secondary | ICD-10-CM | POA: Diagnosis present

## 2010-10-18 DIAGNOSIS — I472 Ventricular tachycardia, unspecified: Secondary | ICD-10-CM | POA: Diagnosis present

## 2010-10-18 DIAGNOSIS — I2510 Atherosclerotic heart disease of native coronary artery without angina pectoris: Secondary | ICD-10-CM | POA: Diagnosis present

## 2010-10-18 DIAGNOSIS — Z9861 Coronary angioplasty status: Secondary | ICD-10-CM

## 2010-10-18 DIAGNOSIS — Z8614 Personal history of Methicillin resistant Staphylococcus aureus infection: Secondary | ICD-10-CM

## 2010-10-18 DIAGNOSIS — M25519 Pain in unspecified shoulder: Secondary | ICD-10-CM | POA: Diagnosis present

## 2010-10-18 DIAGNOSIS — I6529 Occlusion and stenosis of unspecified carotid artery: Secondary | ICD-10-CM | POA: Diagnosis present

## 2010-10-18 DIAGNOSIS — I739 Peripheral vascular disease, unspecified: Secondary | ICD-10-CM | POA: Diagnosis present

## 2010-10-18 DIAGNOSIS — F329 Major depressive disorder, single episode, unspecified: Secondary | ICD-10-CM | POA: Diagnosis present

## 2010-10-18 DIAGNOSIS — I25810 Atherosclerosis of coronary artery bypass graft(s) without angina pectoris: Secondary | ICD-10-CM | POA: Diagnosis present

## 2010-10-18 DIAGNOSIS — E119 Type 2 diabetes mellitus without complications: Secondary | ICD-10-CM | POA: Diagnosis present

## 2010-10-18 DIAGNOSIS — K219 Gastro-esophageal reflux disease without esophagitis: Secondary | ICD-10-CM | POA: Diagnosis present

## 2010-10-18 DIAGNOSIS — G4730 Sleep apnea, unspecified: Secondary | ICD-10-CM | POA: Diagnosis present

## 2010-10-18 DIAGNOSIS — I10 Essential (primary) hypertension: Secondary | ICD-10-CM | POA: Diagnosis present

## 2010-10-18 DIAGNOSIS — F3289 Other specified depressive episodes: Secondary | ICD-10-CM | POA: Diagnosis present

## 2010-10-18 DIAGNOSIS — E669 Obesity, unspecified: Secondary | ICD-10-CM | POA: Diagnosis present

## 2010-10-18 DIAGNOSIS — I5022 Chronic systolic (congestive) heart failure: Secondary | ICD-10-CM | POA: Diagnosis present

## 2010-10-18 DIAGNOSIS — Z86711 Personal history of pulmonary embolism: Secondary | ICD-10-CM

## 2010-10-18 DIAGNOSIS — E785 Hyperlipidemia, unspecified: Secondary | ICD-10-CM | POA: Diagnosis present

## 2010-10-18 DIAGNOSIS — Z87891 Personal history of nicotine dependence: Secondary | ICD-10-CM

## 2010-10-18 HISTORY — PX: CARDIAC CATHETERIZATION: SHX172

## 2010-10-18 LAB — CARDIAC PANEL(CRET KIN+CKTOT+MB+TROPI)
CK, MB: 22.6 ng/mL (ref 0.3–4.0)
Relative Index: 10.1 — ABNORMAL HIGH (ref 0.0–2.5)
Relative Index: 11.9 — ABNORMAL HIGH (ref 0.0–2.5)
Total CK: 223 U/L (ref 7–232)
Troponin I: 1.84 ng/mL (ref <0.30)
Troponin I: 10.61 ng/mL (ref <0.30)

## 2010-10-18 LAB — COMPREHENSIVE METABOLIC PANEL
ALT: 24 U/L (ref 0–53)
Alkaline Phosphatase: 58 U/L (ref 39–117)
BUN: 19 mg/dL (ref 6–23)
CO2: 26 mEq/L (ref 19–32)
GFR calc Af Amer: >60 mL/min (ref >60)
GFR calc non Af Amer: >60 mL/min (ref >60)
Glucose, Bld: 278 mg/dL — ABNORMAL HIGH (ref 70–99)
Potassium: 4 mEq/L (ref 3.5–5.1)
Sodium: 135 mEq/L (ref 135–145)
Total Bilirubin: 0.7 mg/dL (ref 0.3–1.2)
Total Protein: 6.4 g/dL (ref 6.0–8.3)

## 2010-10-18 LAB — TSH: TSH: 0.635 u[IU]/mL (ref 0.350–4.500)

## 2010-10-18 LAB — CBC
HCT: 42.1 % (ref 39.0–52.0)
MCHC: 33.5 g/dL (ref 30.0–36.0)
MCV: 81.1 fL (ref 78.0–100.0)
Platelets: 151 10*3/uL (ref 150–400)
RDW: 16 % — ABNORMAL HIGH (ref 11.5–15.5)
WBC: 20.2 10*3/uL — ABNORMAL HIGH (ref 4.0–10.5)

## 2010-10-18 LAB — LIPID PANEL
Cholesterol: 121 mg/dL (ref 0–200)
Total CHOL/HDL Ratio: 2.8 RATIO

## 2010-10-18 LAB — DIFFERENTIAL
Basophils Absolute: 0 10*3/uL (ref 0.0–0.1)
Eosinophils Absolute: 0 10*3/uL (ref 0.0–0.7)
Eosinophils Relative: 0 % (ref 0–5)
Lymphocytes Relative: 4 % — ABNORMAL LOW (ref 12–46)
Monocytes Absolute: 1.1 10*3/uL — ABNORMAL HIGH (ref 0.1–1.0)

## 2010-10-18 LAB — GLUCOSE, CAPILLARY
Glucose-Capillary: 272 mg/dL — ABNORMAL HIGH (ref 70–99)
Glucose-Capillary: 340 mg/dL — ABNORMAL HIGH (ref 70–99)

## 2010-10-18 LAB — PRO B NATRIURETIC PEPTIDE: Pro B Natriuretic peptide (BNP): 761.4 pg/mL — ABNORMAL HIGH (ref 0–125)

## 2010-10-18 LAB — MRSA PCR SCREENING: MRSA by PCR: NEGATIVE

## 2010-10-18 LAB — HEMOGLOBIN A1C: Mean Plasma Glucose: 223 mg/dL — ABNORMAL HIGH (ref <117)

## 2010-10-18 LAB — PROTIME-INR: INR: 3.71 — ABNORMAL HIGH (ref 0.00–1.49)

## 2010-10-19 ENCOUNTER — Inpatient Hospital Stay (HOSPITAL_COMMUNITY): Payer: Medicare HMO

## 2010-10-19 DIAGNOSIS — I472 Ventricular tachycardia: Secondary | ICD-10-CM

## 2010-10-19 LAB — CBC
HCT: 43.3 % (ref 39.0–52.0)
MCHC: 33.5 g/dL (ref 30.0–36.0)
Platelets: 174 10*3/uL (ref 150–400)
RDW: 16.3 % — ABNORMAL HIGH (ref 11.5–15.5)
WBC: 17.7 10*3/uL — ABNORMAL HIGH (ref 4.0–10.5)

## 2010-10-19 LAB — BASIC METABOLIC PANEL
BUN: 24 mg/dL — ABNORMAL HIGH (ref 6–23)
Chloride: 99 mEq/L (ref 96–112)
Creatinine, Ser: 1.14 mg/dL (ref 0.50–1.35)
GFR calc Af Amer: >60 mL/min (ref >60)
GFR calc non Af Amer: >60 mL/min (ref >60)
Glucose, Bld: 254 mg/dL — ABNORMAL HIGH (ref 70–99)
Potassium: 4.5 mEq/L (ref 3.5–5.1)

## 2010-10-19 LAB — GLUCOSE, CAPILLARY
Glucose-Capillary: 333 mg/dL — ABNORMAL HIGH (ref 70–99)
Glucose-Capillary: 315 mg/dL — ABNORMAL HIGH (ref 70–99)
Glucose-Capillary: 218 mg/dL — ABNORMAL HIGH (ref 70–99)

## 2010-10-19 LAB — LIPID PANEL
HDL: 45 mg/dL (ref >39)
LDL Cholesterol: 58 mg/dL (ref 0–99)
Total CHOL/HDL Ratio: 2.9 RATIO

## 2010-10-19 LAB — CARDIAC PANEL(CRET KIN+CKTOT+MB+TROPI)
CK, MB: 29.6 ng/mL (ref 0.3–4.0)
Troponin I: 6.03 ng/mL (ref <0.30)

## 2010-10-19 LAB — PROTIME-INR
INR: 3.56 — ABNORMAL HIGH (ref 0.00–1.49)
Prothrombin Time: 36.1 seconds — ABNORMAL HIGH (ref 11.6–15.2)

## 2010-10-20 DIAGNOSIS — I517 Cardiomegaly: Secondary | ICD-10-CM

## 2010-10-20 LAB — BASIC METABOLIC PANEL
CO2: 29 mEq/L (ref 19–32)
Calcium: 9 mg/dL (ref 8.4–10.5)
Creatinine, Ser: 0.92 mg/dL (ref 0.50–1.35)
Glucose, Bld: 167 mg/dL — ABNORMAL HIGH (ref 70–99)
Sodium: 138 mEq/L (ref 135–145)

## 2010-10-20 LAB — GLUCOSE, CAPILLARY
Glucose-Capillary: 116 mg/dL — ABNORMAL HIGH (ref 70–99)
Glucose-Capillary: 219 mg/dL — ABNORMAL HIGH (ref 70–99)

## 2010-10-20 LAB — CBC
HCT: 43.2 % (ref 39.0–52.0)
MCHC: 32.4 g/dL (ref 30.0–36.0)
MCV: 82.8 fL (ref 78.0–100.0)
RDW: 16.3 % — ABNORMAL HIGH (ref 11.5–15.5)

## 2010-10-21 LAB — PROTIME-INR
INR: 1.44 (ref 0.00–1.49)
Prothrombin Time: 17.8 seconds — ABNORMAL HIGH (ref 11.6–15.2)

## 2010-10-21 LAB — BASIC METABOLIC PANEL
BUN: 23 mg/dL (ref 6–23)
Calcium: 9 mg/dL (ref 8.4–10.5)
Creatinine, Ser: 1.13 mg/dL (ref 0.50–1.35)
GFR calc non Af Amer: >60 mL/min (ref >60)
Glucose, Bld: 232 mg/dL — ABNORMAL HIGH (ref 70–99)
Sodium: 137 mEq/L (ref 135–145)

## 2010-10-21 LAB — GLUCOSE, CAPILLARY
Glucose-Capillary: 202 mg/dL — ABNORMAL HIGH (ref 70–99)
Glucose-Capillary: 96 mg/dL (ref 70–99)

## 2010-10-21 LAB — CBC
HCT: 46.4 % (ref 39.0–52.0)
RDW: 16.6 % — ABNORMAL HIGH (ref 11.5–15.5)
WBC: 17.4 10*3/uL — ABNORMAL HIGH (ref 4.0–10.5)

## 2010-10-21 NOTE — Cardiovascular Report (Signed)
[name], [name] NO.:  [id number]  MEDICAL RECORD NO.:  [id number]  LOCATION:  [facility]                         FACILITY:  [facility]  PHYSICIAN:  Verne Carrow, MDDATE OF BIRTH:  April 09, 1940  DATE OF PROCEDURE:  10/18/2010 DATE OF DISCHARGE:                           CARDIAC CATHETERIZATION   PRIMARY CARDIOLOGIST:  Cassell Clement, MD  PROCEDURES PERFORMED: 1. Left heart catheterization. 2. Selective coronary angiography. 3. Left ventricular angiogram.  OPERATOR:  Verne Carrow, MD  INDICATIONS:  This is a 70 year old gentleman with diffuse peripheral vascular disease, carotid artery disease, and prior coronary artery bypass grafting surgery, who began to have severe chest pain this morning.  A code STEMI was called and the patient was brought straight to the cath lab.  PROCEDURE IN DETAIL:  The patient was brought emergently to the Cardiac Catheterization Laboratory.  Emergency consent was obtained.  The right groin was prepped and draped in a sterile fashion.  Lidocaine 1% was used for local anesthesia.  A 6-French sheath was inserted into the right femoral artery without difficulty.  A JL-4 catheter was used to perform selective angiography of the left coronary system.  A JR-4 catheter was used to perform selective angiography of the right coronary artery.  A pigtail catheter was used to perform a left ventricular angiogram.  We did not attempt to engage any of the vein grafts or the left internal mammary artery graft as these were all known to be occluded by catheterization in 2006.  The patient tolerated the procedure well.  The patient had minimal chest pain when leaving the Cath Lab.  He was taken to the recovery area in stable condition.  HEMODYNAMIC FINDINGS:  Central aortic pressure 115/55.  Left ventricular pressure 111/5/19.  ANGIOGRAPHIC FINDINGS: 1. The left main coronary artery at 30% distal plaque. 2. The left anterior  descending was a large vessel that coursed to the     apex and gave off a large caliber diagonal branch.  The LAD and     diagonal were patent with mild 30% plaque in the proximal and mid     LAD.  There were no focally obstructive lesions noted in this     vessel.  This vessel appeared unchanged from prior catheterization. 3. Circumflex artery was totally occluded at the ostium.  This was     unchanged from prior catheterization.  The vein graft that goes to     the circumflex artery has been known to be occluded for 6 years. 4. The right coronary artery has 100% ostial occlusion.  The vein     graft that went to this vessel was known to be occluded since 2006. 5. The saphenous vein graft to the PDA is known to be occluded and was     not selectively injected. 6. The saphenous vein graft to the obtuse marginal branch is known to     be occluded and was not selectively injected. 7. The left internal mammary artery graft to the mid LAD was known to     be atretic and was not selectively engaged or injected. 8. Left ventricular angiogram was performed in the RAO projection and  showed akinesis of the inferior wall with overall ejection fraction     of 30-35%.  IMPRESSION: 1. Severe double-vessel coronary artery disease with occluded right     coronary artery and circumflex arteries as well as occluded graft     to these vessels, all unchanged since prior catheterization. 2. Patent LAD and diagonal with minimal disease. 3. Overall unchanged coronary anatomy since 2006. 4. Moderate left ventricular systolic dysfunction. 5. Chest pain of unknown etiology.  RECOMMENDATIONS:  The patient will be admitted to the CCU.  We will continue his home medications and will cycle cardiac enzymes as well as check a complete lab work.  We will obtain a x-ray as well as a D-dimer.     Verne Carrow, MD     CM/MEDQ  D:  10/18/2010  T:  10/18/2010  Job:  [id number]  cc:   Cassell Clement  Electronically Signed by Verne Carrow MD on 10/21/2010 02:40:53 PM

## 2010-10-22 ENCOUNTER — Telehealth: Payer: Self-pay | Admitting: Family Medicine

## 2010-10-22 ENCOUNTER — Ambulatory Visit: Payer: Medicare HMO | Admitting: Family Medicine

## 2010-10-22 NOTE — Telephone Encounter (Signed)
Called to check on [name] as he missed his appointment and he is a followup from hospitalization and heart cath.  No answer.  Left message to call back and re-schedule.  I am in clinic tomorrow and it is okay to double-book.

## 2010-10-23 ENCOUNTER — Ambulatory Visit (INDEPENDENT_AMBULATORY_CARE_PROVIDER_SITE_OTHER): Payer: Medicare HMO | Admitting: Family Medicine

## 2010-10-23 ENCOUNTER — Encounter: Payer: Self-pay | Admitting: Family Medicine

## 2010-10-23 ENCOUNTER — Ambulatory Visit (INDEPENDENT_AMBULATORY_CARE_PROVIDER_SITE_OTHER): Payer: Medicare HMO | Admitting: *Deleted

## 2010-10-23 DIAGNOSIS — I959 Hypotension, unspecified: Secondary | ICD-10-CM

## 2010-10-23 DIAGNOSIS — I5022 Chronic systolic (congestive) heart failure: Secondary | ICD-10-CM

## 2010-10-23 DIAGNOSIS — Z7901 Long term (current) use of anticoagulants: Secondary | ICD-10-CM

## 2010-10-23 DIAGNOSIS — Z8672 Personal history of thrombophlebitis: Secondary | ICD-10-CM

## 2010-10-23 DIAGNOSIS — J449 Chronic obstructive pulmonary disease, unspecified: Secondary | ICD-10-CM

## 2010-10-23 DIAGNOSIS — R079 Chest pain, unspecified: Secondary | ICD-10-CM

## 2010-10-23 LAB — POCT INR: INR: 1.8

## 2010-10-23 NOTE — Patient Instructions (Signed)
I want you to take 1/2 of two of your new medicines:  Bisopolol and Imdur.  The new dose will be Bisoprolol 2.5 mg twice daily and Imdur 15 mg once daily. Keep the inhaler for when you feel short of breath.  Come back and see me next week so I know you're still doing okay.

## 2010-10-25 ENCOUNTER — Encounter: Payer: Self-pay | Admitting: Family Medicine

## 2010-10-25 DIAGNOSIS — I959 Hypotension, unspecified: Secondary | ICD-10-CM | POA: Insufficient documentation

## 2010-10-25 NOTE — Assessment & Plan Note (Signed)
On Beta blocker, ACE-I, diuretic.  Aldactone added last hospitalization. See hypotension below -- low blood pressures have limited maximizing his medications in past.

## 2010-10-25 NOTE — Assessment & Plan Note (Signed)
Improved, no further symptoms since discharge from hospital Imdur added to regimen Warnings and reasons to return to clinic/ED reviewed with patient.

## 2010-10-25 NOTE — Assessment & Plan Note (Addendum)
Repeat BP still low.  Patient blood pressures at baseline 100-110 systolic.  This has limited maximizing his CHF regimen in past.  Believe this to be cause of malaise today.   Plan to decrease Bisoprolol dose to 1/2 tab bid, decrease Imdur to 1/2 tab po daily.  No change to other medications.   He is to return next week for BP check or sooner if symptoms do not improve or worsen.   Patient states he fell at hospital due to lightheadedness, witnessed only by wife.  Do not want hypotension to limit his functionality.

## 2010-10-25 NOTE — Assessment & Plan Note (Signed)
Wheezing noted on exam.  No change in albuterol administration with peak flows.  Non-reversible disease. Provided him with Albuterol as he does not have this at home.   Also without any long-term medications. Patient leery of adding more medications at this time, encouraged him to reconsider.  Will discuss next OV next week.

## 2010-10-25 NOTE — Progress Notes (Signed)
  Subjective:    Patient ID: [name], male    DOB: 05/13/1940, 70 y.o.   MRN: [id number]  HPI 1.  Hospital follow-up:  Patient presented to hospital with acute chest pain 10/18/10.  EKG showed NSTEMI, taken to cath lab, no new obstruction found.  Troponins elevated to 10 while in house.  Pain resolved and patient discharged home with new medications of Bisoprolol, Imdur, and Aldactone to regimen.  Since discharge on 7/30, no further pain.  However patient does describe "feeling bad"  -- qualified as malaise, lightheadedness, fatigue.  Missed appt with me on 7/31 because he felt too bad to leave house.  Today, reports feeling somewhat better though symptoms persist.  No chest pain.  Mild SOB.  LE edema improved since I last saw him prior to hospitalization.  No palpitations.  No nausea or vomiting.  Notes some decrease in abdominal girth since last OV.  Review of Systems See HPI above for review of systems.       Objective:   Physical Exam Gen:  Alert, cooperative patient who appears stated age in no acute distress.  Vital signs reviewed. Neck: No masses or thyromegaly or limitation in range of motion.  No cervical lymphadenopathy.  No JVD Cardiac:  Regular rate and rhythm.  Grade II SEM noted.   Pulm: Wheezing noted at bases.  Prolonged expiratory phase.  Abd:  Soft/nondistended/nontender.  Good bowel sounds throughout all four quadrants.  No masses noted. Obese Ext:  Chronic venous stasis changes noted.  +1 Edema to mid-shin, Left > Right, but improved from prior exam.    Peak flows performed:  350 cc prior to and after Albuterol administration.  No change in peak flows with albuterol.          Assessment & Plan:

## 2010-10-29 NOTE — Discharge Summary (Signed)
[name], [name] NO.:  [id number]  MEDICAL RECORD NO.:  [id number]  LOCATION:  2003                         FACILITY:  [facility]  PHYSICIAN:  Cassell Clement, M.D. DATE OF BIRTH:  04/07/1940  DATE OF ADMISSION:  10/18/2010 DATE OF DISCHARGE:  10/21/2010                              DISCHARGE SUMMARY   PRIMARY CARDIOLOGIST:  Rollene Rotunda, MD, [facility].  PRIMARY CARE PHYSICIAN:  Dr. Renold Don at the [facility].  DISCHARGE DIAGNOSIS:  Inferior ST-segment elevation myocardial infarction.  SECONDARY DIAGNOSES: 1. Coronary artery disease status post prior coronary artery bypass     graft x3 in 1992, known occlusions of the vein grafts, known to     have an atretic left internal mammary artery with occluded right     coronary artery circumflex. 2. Ischemic cardiomyopathy with ejection fraction 35% by echo this     admission. 3. Chronic systolic congestive heart failure. 4. Peripheral vascular disease status post left fem-pop bypass. 5. Revision to femoral-tibial bypass, March 2008. 6. Status post carotid endarterectomy in 1995. 7. Sleep apnea, on continuous positive airway pressure. 8. Obesity. 9. Hypertension. 10.Hyperlipidemia. 11.Chronic obstructive pulmonary disease and remote tobacco abuse. 12.Gastroesophageal reflux disease. 13.Insulin-dependent diabetes mellitus. 14.Depression. 15.History of pulmonary embolism and deep vein thrombosis, on chronic     Coumadin anticoagulation. 16.Diverticulosis. 17.Status post cervical fusion in 2002. 18.History of lower extremity cellulitis. 19.History of methicillin-resistant Staphylococcus aureus bacteremia. 20.History of septic sternoclavicular joint. 21.Status post right rotator cuff repair. 22.Status post right carpal tunnel release. 23.Status post right elbow surgery.  ALLERGIES:  VALIUM.  PROCEDURES: 1. Left heart cardiac catheterization, October 18, 2010, showing left     main 30% distal.   LAD 30% proximal and mid.  Diagonal was a large-     caliber vessel and widely patent.  The left circumflex and RCA were     occluded ostially.  The vein graft to the PDA and vein graft to     obtuse marginal were known to be occluded and not injected.  The     LIMA to LAD was not injected as this also known to be atretic.  EF     was 30% with akinesis of the inferior wall.  Anatomy was not much     changed from last catheterization in 2006 and medical therapy was     recommended. 2. 2-D echocardiogram, October 20, 2010, EF 35%.  Grade 2 diastolic     dysfunction and anterolateral wall hypokinesis.  Mild aortic     stenosis with trivial mitral regurgitation.  The lipase was mildly     to moderately dilated.  RV systolic function was normal.  HISTORY OF PRESENT ILLNESS:  A 70 year old male with the above complex problem list who was in his usual state of health approximately 8:30 on the morning of admission when he had sudden onset of substernal chest discomfort.  EMS was called and ECG showed inferior ST-segment elevations, prompting activation of a code STEMI.  The patient was taken to [facility] cath lab where diagnostic procedure was performed.  HOSPITAL COURSE:  The patient underwent diagnostic catheterization, July 27, revealing known multivessel disease including occlusions of a  right coronary artery and left circumflex.  The vein graft to the PDA and obtuse marginal were not injected as they were known to be occluded. The LIMA to the LAD also was not injected as it was noted to be occluded.  The native LAD 30% proximal mid stenoses, which was unchanged from prior catheterization and therefore medical therapy was warranted. Of note, EF 30% with inferior akinesis.  The patient was transferred to the coronary intensive care unit where he subsequently peaked his cardiac markers with a CK of 451, MB 53.5, and troponin-I of 10.61.  He was maintained on aspirin, statin, beta- blocker,  ACE inhibitor, and long-acting nitrate therapy without recurrent chest pain.  From a heart failure standpoint, the patient has remained euvolemic, did not require any additional diuresis.  We have added low-dose spirolactone for additional management of his heart failure.  Of note, in review of EMS records, it was noted that the patient had ventricular tachycardia upon EMS arrival.  This was spontaneously terminated and did not require cardioversion or antiarrhythmic therapy. Given multiple comorbidities, the patient is not felt to be AICD candidate and his beta-blocker was titrated with a goal of suppressing nonsustained ventricular tachycardia, which up to this point has been successful.  The patient would have recurrent ventricular tachycardia with consider initiation of sotalol therapy.  The patient was transferred out to the floor over the weekend, has been ambulating without recurrent symptoms or limitations.  He is on chronic Coumadin therapy for history of PE and DVT.  On admission, his INR was supratherapeutic.  His dose was held on July 27 and July 28, and INR this morning was 1.44.  Coumadin will be resumed and we will plan to follow up later this week in the [facility].  A Family Medicine Team has been following along and the patient's home insulin dose has been titrated.  DISCHARGE LABS:  Hemoglobin 15.7, hematocrit 46.4, WBC 7.4, platelets 156.  INR 1.44.  Sodium 137, potassium 4.6, chloride 101, CO2 26, BUN 23, creatinine 1.13, glucose 232, total bilirubin 0.7, alkaline phosphatase 58, AST 26, ALT 24, total protein 6.4, albumin 3.2, calcium 9.0.  Hemoglobin A1c 9.4.  CK 225, MB 29.6, troponin-I 6.03, BNP 386.8. Total cholesterol 132, triglycerides 146, HDL 45, LDL 58.  TSH 0.635. MRSA screen was negative.  DISPOSITION:  The patient will be discharged home today in good condition.  FOLLOWUP PLANS AND APPOINTMENTS:  The patient will follow up Tereso Newcomer,  PA, at the [facility] on August 13 at 3 p.m.  The patient will follow up Dr. Gwendolyn Grant as previously scheduled.  The patient will require a repeat INR on August 13, prescription has been provided.  DISCHARGE MEDICATIONS: 1. Bisoprolol 5 mg b.i.d. 2. Imdur 30 mg daily. 3. Spirolactone 25 mg half tablet daily. 4. Humulin N 70 units b.i.d. 5. Nitroglycerin 0.4 mg subcu p.r.n. chest pain. 6. Coumadin 5 mg daily. 7. Alprazolam 0.5 mg daily p.r.n. 8. Aspirin 81 mg daily. 9. Furosemide 40 mg daily. 10.Hydrocodone/APAP 5/500 mg q.6 h. p.r.n. 11.Lisinopril 10 mg daily. 12.Polyethylene glycol 3350 17 grams daily p.r.n. 13.Simvastatin 80 mg at bedtime. 14.Venlafaxine 37.5 mg daily.  OUTSTANDING LAB STUDIES:  Follow up PT/INR later this week.  DURATION OF DISCHARGE ENCOUNTER:  45 minutes including physician time.     Nicolasa Ducking, ANP   ______________________________ Cassell Clement, M.D.    CB/MEDQ  D:  10/21/2010  T:  10/21/2010  Job:  [id number]  cc:   Trey Paula  Gwendolyn Grant, MD  Electronically Signed by Nicolasa Ducking ANP on 10/22/2010 16:10:96 PM Electronically Signed by Cassell Clement M.D. on 10/29/2010 01:38:39 PM

## 2010-10-30 ENCOUNTER — Telehealth: Payer: Self-pay | Admitting: *Deleted

## 2010-10-30 NOTE — Telephone Encounter (Signed)
[name], [facility] nurse called and stated that patient is complaining os SOB at times when he walks short distances. Patient also is having swelling of both lower legs, patient stated that his lasix was cut in half by you and this is why he is swelling. He is coming here for an OV with you on Friday 8/10 also.  Nurses cell # is [phone]

## 2010-10-31 NOTE — Telephone Encounter (Signed)
**   Late chart entry  Yesterday 10/30/10 PM:  Returned staff page and was told about [name].  Of note,  I changed his Bisoprolol and Imdur at last visit.  Did not adjust his Furosemide/lasix.  Unsure if this happened in hospital but did not seem to based on reading discharge summary.  Recommended that he be seen if she is worried, otherwise to go back to original lasix dose and I would see him as scheduled on Friday.

## 2010-11-01 ENCOUNTER — Other Ambulatory Visit: Payer: Self-pay | Admitting: Family Medicine

## 2010-11-01 ENCOUNTER — Encounter: Payer: Self-pay | Admitting: Family Medicine

## 2010-11-01 ENCOUNTER — Ambulatory Visit (INDEPENDENT_AMBULATORY_CARE_PROVIDER_SITE_OTHER): Payer: Medicare HMO | Admitting: *Deleted

## 2010-11-01 ENCOUNTER — Ambulatory Visit (INDEPENDENT_AMBULATORY_CARE_PROVIDER_SITE_OTHER): Payer: Medicare HMO | Admitting: Family Medicine

## 2010-11-01 ENCOUNTER — Encounter: Payer: Self-pay | Admitting: Physician Assistant

## 2010-11-01 VITALS — BP 104/59 | HR 67 | Temp 98.0°F | Ht 68.0 in | Wt 295.0 lb

## 2010-11-01 DIAGNOSIS — Z7901 Long term (current) use of anticoagulants: Secondary | ICD-10-CM

## 2010-11-01 DIAGNOSIS — I10 Essential (primary) hypertension: Secondary | ICD-10-CM

## 2010-11-01 DIAGNOSIS — I872 Venous insufficiency (chronic) (peripheral): Secondary | ICD-10-CM

## 2010-11-01 DIAGNOSIS — Z8672 Personal history of thrombophlebitis: Secondary | ICD-10-CM

## 2010-11-01 DIAGNOSIS — J449 Chronic obstructive pulmonary disease, unspecified: Secondary | ICD-10-CM

## 2010-11-01 DIAGNOSIS — N400 Enlarged prostate without lower urinary tract symptoms: Secondary | ICD-10-CM | POA: Insufficient documentation

## 2010-11-01 DIAGNOSIS — I5022 Chronic systolic (congestive) heart failure: Secondary | ICD-10-CM

## 2010-11-01 LAB — COMPREHENSIVE METABOLIC PANEL
Alkaline Phosphatase: 57 U/L (ref 39–117)
BUN: 13 mg/dL (ref 6–23)
CO2: 25 mEq/L (ref 19–32)
Creat: 1.06 mg/dL (ref 0.50–1.35)
Glucose, Bld: 134 mg/dL — ABNORMAL HIGH (ref 70–99)
Total Bilirubin: 0.6 mg/dL (ref 0.3–1.2)

## 2010-11-01 LAB — CBC
HCT: 43.8 % (ref 39.0–52.0)
Hemoglobin: 14 g/dL (ref 13.0–17.0)
MCH: 27.1 pg (ref 26.0–34.0)
MCHC: 32 g/dL (ref 30.0–36.0)
MCV: 84.7 fL (ref 78.0–100.0)
RBC: 5.17 MIL/uL (ref 4.22–5.81)

## 2010-11-01 MED ORDER — TAMSULOSIN HCL 0.4 MG PO CAPS: 0.4000 mg | ORAL_CAPSULE | Freq: Every day | ORAL | Status: DC

## 2010-11-01 NOTE — Telephone Encounter (Signed)
Refill request

## 2010-11-01 NOTE — Assessment & Plan Note (Signed)
Again, much less likely DVT.  See above CHF for further info

## 2010-11-01 NOTE — Assessment & Plan Note (Addendum)
O2 sats 88% at rest.   Decrease to 87% on exertion. Plan to refer for home O2 therapy.   Would benefit from long-acting inhalers, did not discuss this with him today.  To continue Albuterol as needed.

## 2010-11-01 NOTE — Assessment & Plan Note (Signed)
Still believe this is major contributing factor to dyspnea.  Weight has increased from 290 (7/25) --> 293 (8/1) --> 295 today.  Will increase Furosemide to twice daily x next 5 days, fu afterwards.  Less likely that LE edema is secondary to PE as he is therapeutic, edema is improving, no erythema.  Hypotension still limits CHF medical maximization.

## 2010-11-01 NOTE — Patient Instructions (Signed)
We are going to try and get you some home oxygen. For the next five days (until Tuesday) I want you to take 2 fluid pills (Furosemide). I will start the pill to help with your prostate.  It is called Flomax.   Come back and see me either Tuesday or Wednesday next week.

## 2010-11-01 NOTE — Progress Notes (Signed)
  Subjective:    Patient ID: [name], male    DOB: 04-23-1940, 70 y.o.   MRN: [id number]  HPI 1.  Shortness of breath:  Initially improved, but now worsening.  Using inhaler once or twice daily with intermittent relief.  Taking Furosemide once daily.  Dyspnea occurs mostly on exertion when walking to bathroom.  Sometimes goes outside to deck but fears leaving vicinity of house because of dyspnea.  No real cough.  No chest pain or palpitations.    2.  Calf pain:  Increasing, mostly in Right calf, for past several days, now remitting.  States it becomes painful when swollen, taking increased fluid pill relieves pain.  No redness noted.  Swelling and pain have improved today.  Taking Coumadin regularly.    3.  Urinary frequency:  Long-standing problem for patient, but worse whenever we increase his Furosemide for obvious reasons.  Has never been on BPH reducing med in past. Has had PSA checked before and it was WNL.  Up every hour on the hour at night, every single night.  When asked how he sleeps having to get up so much, he replies, "very carefully."  States he falls back asleep immediately after getting up.  No accidents.  Occasional incontinence during day if he cannot reach restroom in time.  No hematuria, bowel incontinence.     Review of Systems See HPI above for review of systems.       Objective:   Physical Exam Gen:  Alert, cooperative patient who appears stated age in no acute distress.  Vital signs reviewed.  Obese.  Appears short of breath after walking around clinic, but not actively in distress. Neck: No masses or thyromegaly or limitation in range of motion.  No cervical lymphadenopathy.  No JVD Cardiac:  Regular rate and rhythm without murmur auscultated.  Good S1/S2. Pulm:  No crackles.  Some wheezing noted, but minimal.  Decreased BS at bases Abd:  Soft/nondistended/nontender.  Good bowel sounds throughout all four quadrants.  No masses noted.  Obese Extr:  BL venous  stasis changes noted.  +3 pitting edema BL, but L > R.  No erythema.       Assessment & Plan:

## 2010-11-01 NOTE — Assessment & Plan Note (Addendum)
Plan to start patient on Flomax.  Would likely benefit from Finasteride, or some other alpha reductase inhibitor.  Warned patient about hypotension.  Hopefully this will provide him some relief from constant urge symptoms.

## 2010-11-04 ENCOUNTER — Encounter: Payer: Medicare HMO | Admitting: Physician Assistant

## 2010-11-06 ENCOUNTER — Ambulatory Visit (INDEPENDENT_AMBULATORY_CARE_PROVIDER_SITE_OTHER): Payer: Medicare HMO | Admitting: Family Medicine

## 2010-11-06 ENCOUNTER — Encounter: Payer: Self-pay | Admitting: Family Medicine

## 2010-11-06 VITALS — BP 106/62 | HR 70 | Temp 98.7°F | Ht 68.0 in | Wt 293.4 lb

## 2010-11-06 DIAGNOSIS — G4730 Sleep apnea, unspecified: Secondary | ICD-10-CM

## 2010-11-06 DIAGNOSIS — J449 Chronic obstructive pulmonary disease, unspecified: Secondary | ICD-10-CM

## 2010-11-06 DIAGNOSIS — N400 Enlarged prostate without lower urinary tract symptoms: Secondary | ICD-10-CM

## 2010-11-06 DIAGNOSIS — I5022 Chronic systolic (congestive) heart failure: Secondary | ICD-10-CM

## 2010-11-06 MED ORDER — TIOTROPIUM BROMIDE MONOHYDRATE 18 MCG IN CAPS: 18.0000 ug | ORAL_CAPSULE | Freq: Every day | RESPIRATORY_TRACT | Status: DC

## 2010-11-06 NOTE — Patient Instructions (Signed)
Keep going with 2 fluid pills for the rest of the weekend.  You can go back to 1 on Monday.  Keep taking the Flomax.  Next week I want increase the FLomax to 2 pills.  I will send in the higher dose today.  I'm going to add Spiriva as well.  Take this once a day.  Keep using your inhaler as you need it.   Come back and see me in 2 weeks.

## 2010-11-07 NOTE — Assessment & Plan Note (Signed)
Believe dyspnea currently secondary to COPD rather than CHF exac due to no rales, improving LE edema

## 2010-11-07 NOTE — Assessment & Plan Note (Signed)
Adding Spiriva today.  Continue rescue inhaler. Clinic staff called home health nurse who states that they are bringing oxygen to house later today after appt.   Will re-asses 2 weeks after 02 use

## 2010-11-07 NOTE — Assessment & Plan Note (Signed)
Patient also mentioned on way out no change in having to use bathroom every hour at night.  Wait 1 more week, then increase dose if no change.

## 2010-11-07 NOTE — Assessment & Plan Note (Signed)
May benefit from CPAP. Unable to assess pulm hypertension on most recent echo, likely some component of this present.   Formal sleep study a possibilty, would like to assess improvement on dyspnea after using oxygen

## 2010-11-07 NOTE — Progress Notes (Signed)
  Subjective:    Patient ID: [name], male    DOB: 10-Mar-1941, 70 y.o.   MRN: [id number]  HPI Fu dyspnea:  Persists, but somewhat improved.  Using albuterol inhaler 3-4 times daily.  Home health has not brought Home O2 yet. Cough decreasing.  No chest pain, palpitations, productive cough.  No fevers or chills  LE edema:  Improved s/p furosemide.  Leg pain resolving.  No redness or erythema, no change to chronic LE venous stasis changes.     Review of Systems See HPI above for review of systems.       Objective:   Physical Exam Gen:  Alert, cooperative patient who appears stated age in no acute distress.  Vital signs reviewed. Neck: No masses or thyromegaly or limitation in range of motion.  No cervical lymphadenopathy. Cardiac:  Regular rate and rhythm without murmur auscultated.  Good S1/S2. Lungs:  Wheezing at bases.  No crackles Abd:  Soft/nondistended/nontender.  Good bowel sounds throughout all four quadrants.  No masses noted.   Obese, no abd wall edema noted Extr:  Chronic venous stasis changes, surgical scars noted       Assessment & Plan:

## 2010-11-14 NOTE — H&P (Signed)
[name], [name] NO.:  [id number]  MEDICAL RECORD NO.:  [id number]  LOCATION:  2905                         FACILITY:  [facility]  PHYSICIAN:  Hillis Range, MD       DATE OF BIRTH:  10/11/1940  DATE OF ADMISSION:  10/18/2010 DATE OF DISCHARGE:                             HISTORY & PHYSICAL   PRIMARY CARDIOLOGIST:  Rollene Rotunda, MD, [facility].  PRIMARY CARE PROVIDER:  Renold Don, MD in the [facility].  PATIENT PROFILE:  A 70 year old male with history of severe vascular disease, who presents with recurrent chest pain and concern for inferior ST elevation MI. 1. Inferior ST elevation MI/CAD.     a.     Status post CABG x3 in 1992 with placement of vein graft to      the obtuse marginal, vein graft to the right coronary artery, and      LIMA to the LAD.     b.     The patient had multiple interventions involving the vein      graft to the RCA and vein graft to the obtuse marginal.     c.     The patient had inferior ST elevation MI, December 2006,      catheterization showing left main 20-30%, distal LAD normal, left      circumflex occluded, RCA occluded, the vein graft to the obtuse      marginal occluded, LIMA to the LAD was atretic, the vein graft to      the RCA was occluded which was new.  EF was 35%-40%.  The patient      is medically managed. 2. Ischemic cardiomyopathy.     a.     Transesophageal echocardiogram, December 14, 2009, EF 45%-      50%. 3. Peripheral vascular disease.     a.     Status post left fem-pop bypass that was revised on June 22, 2006, fem-tib bypass. 4. Status post left carotid endarterectomy in 1995. 5. Sleep apnea, on CPAP. 6. Obesity. 7. Hypertension. 8. Hyperlipidemia. 9. COPD and remote tobacco abuse. 10.GERD. 11.Insulin-dependent diabetes mellitus. 12.Depression. 13.History of PE and DVT, on chronic Coumadin anticoagulation. 14.Diverticulosis. 15.Status post cervical fusion in 2002. 16.History of  MRSA bacteremia. 17.History of septic sternoclavicular joint. 18.Status post right rotator cuff repair. 19.Status post right carpal tunnel release. 20.History of right elbow surgery.  ALLERGIES:  VALIUM.  HISTORY OF PRESENT ILLNESS:  A 70 year old male with the above complex problem list.  The patient has been seen by his primary care provider recently in the past week secondary to dyspnea and concern for bronchitis/COPD flare.  The patient been treated with doxycycline, also completed a short prednisone burst.  The patient, however, shortly after waiting this morning about 8:30, the patient felt substernal chest discomfort similar to previous angina. EMS was called and he was found to have inferior ST-segment elevation on his ECG.  Code STEMI was called and the patient was taken emergently to the [facility] cath lab where he initially had mild chest discomfort. Catheterization was performed showing stable anatomy with a patent LAD and otherwise multivessel and graft occlusive  disease.  Medical therapy is recommended.  He is currently pain-free in the CCU.  HOME MEDICATIONS: 1. Aspirin 81 mg daily. 2. Nitroglycerin p.r.n. 3. Potassium chloride 20 mEq b.i.d. 4. Coumadin as directed. 5. Lasix 40 mg daily. 6. Simvastatin 80 mg at bedtime. 7. Humulin N 65 units b.i.d. 8. Lisinopril 10 mg daily. 9. Tylenol with Codeine p.r.n. 10.Lopressor 25 mg b.i.d. 11.Doxycycline 100 mg b.i.d. with three more days to go.  FAMILY HISTORY:  Noncontributory for early CAD.  SOCIAL HISTORY:  The patient lives in [place] with his wife.  He is retired.  He previously smoked heavily, quit 20 years ago.  He denies alcohol or drugs.  He does not exercise.  REVIEW OF SYMPTOMS:  Positive for chest pain as outlined in the HPI.  He is a full code.  Otherwise, all systems are reviewed and negative.  PHYSICAL EXAMINATION:  VITAL SIGNS:  The patient is afebrile, his heart rate is 78, respirations 18, blood  pressure 114/63, pulse ox 94% on 2 liters. GENERAL:  Pleasant, white male, in no acute distress.  Awake, alert, and oriented x3.  He has a normal affect. HEENT:  Normal.  Nares are grossly intact, nonfocal. SKIN:  Warm and dry with chronic stasis and arterial insufficiency changes of the left lower extremity, and osteomyelitis of the left great toe. NECK:  Supple without JVD. LUNGS:  Respirations are unlabored with rhonchi bilaterally. CARDIAC:  Regular, distant S1-S2, no murmurs. ABDOMEN:  Obese, soft, nontender, nondistended.  Bowel sounds present. EXTREMITIES:  Warm, dry, pink.  No clubbing, cyanosis, trace bilateral extremity edema.  Skin changes as outlined above.  Chest x-ray is pending.  EKG shows sinus tachycardia, rate 107, normal axis, IVCD, inferior ST-segment elevation.  Hemoglobin 14.1, hematocrit 42.1, WBC 20.2, platelets 150.  Glucose 272.  INR 3.71.  ASSESSMENT AND PLAN: 1. Acute coronary syndrome.  EKG suggested inferior ST elevation     myocardial infarction.  Catheterization shows multivessel disease,     but otherwise stable anatomy and patent left anterior descending.     We will plan to continue home medications as long-acting nitrates     and follow-up enzymes. 2. Ischemic cardiomyopathy.  Follow-up chest x-ray.  Volume difficult     to assess given size.  Continue beta-blocker, ACE inhibitor.  Not     an ideal ICD candidate. 3. Diabetes mellitus.  Continue Humulin N. 4. Hypertension, stable. 5. Hyperlipidemia.  Continue high-dose statin. 6. History of deep vein thrombosis and pulmonary embolism.  The     patient is on Coumadin chronically.  We will hold this tonight     given catheterization today with sheath pulled and INR of     supratherapeutic, but would look to resume prior to discharge. 7. PVD with lower extremity skin changes, osteomyelitis.  We will ask     Wound Care to evaluate.  The patient will also be followed by     [facility] while  in-house.  Thank you for following this patient.     Nicolasa Ducking, ANP   ______________________________ Hillis Range, MD    CB/MEDQ  D:  10/18/2010  T:  10/18/2010  Job:  [id number]  Electronically Signed by Nicolasa Ducking ANP on 10/22/2010 21:30:86 PM Electronically Signed by Hillis Range MD on 11/14/2010 09:42:11 AM

## 2010-11-15 ENCOUNTER — Ambulatory Visit: Payer: Medicare HMO

## 2010-11-20 ENCOUNTER — Encounter: Payer: Self-pay | Admitting: Family Medicine

## 2010-11-20 ENCOUNTER — Ambulatory Visit (INDEPENDENT_AMBULATORY_CARE_PROVIDER_SITE_OTHER): Payer: Medicare HMO | Admitting: *Deleted

## 2010-11-20 ENCOUNTER — Ambulatory Visit (INDEPENDENT_AMBULATORY_CARE_PROVIDER_SITE_OTHER): Payer: Medicare HMO | Admitting: Family Medicine

## 2010-11-20 DIAGNOSIS — J449 Chronic obstructive pulmonary disease, unspecified: Secondary | ICD-10-CM

## 2010-11-20 DIAGNOSIS — Z8672 Personal history of thrombophlebitis: Secondary | ICD-10-CM

## 2010-11-20 DIAGNOSIS — L723 Sebaceous cyst: Secondary | ICD-10-CM

## 2010-11-20 DIAGNOSIS — Z7901 Long term (current) use of anticoagulants: Secondary | ICD-10-CM

## 2010-11-20 DIAGNOSIS — N400 Enlarged prostate without lower urinary tract symptoms: Secondary | ICD-10-CM

## 2010-11-20 DIAGNOSIS — IMO0002 Reserved for concepts with insufficient information to code with codable children: Secondary | ICD-10-CM

## 2010-11-20 LAB — POCT INR: INR: 3.6

## 2010-11-20 MED ORDER — ACETAMINOPHEN-CODEINE #3 300-30 MG PO TABS: 1.0000 | ORAL_TABLET | Freq: Four times a day (QID) | ORAL | Status: DC | PRN

## 2010-11-20 NOTE — Patient Instructions (Signed)
Make an appt for procedure clinic on your way out today. Come back and see me in a month or so.

## 2010-11-21 NOTE — Progress Notes (Signed)
  Subjective:    Patient ID: [name], male    DOB: Jul 18, 1940, 70 y.o.   MRN: [id number]  HPI 1.  COPD:  Shortness of breath much improved after starting Spriva.  Using daily.  Home health finally delivered O2 yesterday.  Has not yet hooked it up to use it.  Feels he is able ambulate further without having to stop due to dyspnea.  No fevers or chills.  2.  BPH/Urinary frequency:  Much improved.  Prior, he was running to the bathroom roughly every hour at night.  He now states that he goes every 2 1/2 - 3 hours.  Feels he is thus sleeping better at night and overall feels better.  Rare incontinence during day  3.  Lump on neck:  Present for several years.  Not growing or tender.  No drainage or redness.  Asking about removal today due to aggravation when he pulls shirt on and off  Review of Systems See HPI above for review of systems.   .    Objective:   Physical Exam Gen:  Alert, cooperative patient who appears stated age in no acute distress.  Vital signs reviewed. Cardiac:  Regular rate and rhythm without murmur auscultated.  Good S1/S2. Lungs:  Much better aeration thoughout.  No wheezes.  Minimal rhonchi at bases. Extr:  Chronic venous stasis changes.  +1-+2 edema BL, but about at his baseline.   Neck:  3 x 2 cm in diameter mobile, nontender, nonfluctant mass located Right posterior portion of neck.  No erythema        Assessment & Plan:

## 2010-11-23 ENCOUNTER — Encounter: Payer: Self-pay | Admitting: Family Medicine

## 2010-11-23 DIAGNOSIS — IMO0002 Reserved for concepts with insufficient information to code with codable children: Secondary | ICD-10-CM | POA: Insufficient documentation

## 2010-11-23 NOTE — Assessment & Plan Note (Signed)
Likely simple cyst, requesting removal. Will refer to Procedure clinic for removal

## 2010-11-23 NOTE — Assessment & Plan Note (Signed)
Very much improved.  Continue Spiriva, he is happy with results.  Will re-assess in 1 month, encouraged him to fully engage with using his O2 therapy.   May still bring up formal sleep study (to assess needs for CPAP with question of pulmonary hypertension) at next visit depending on improvement on O2 therapy.

## 2010-11-23 NOTE — Assessment & Plan Note (Signed)
Did not inquire whether he is still taking just the 1 pill or increased to 0.8 mg (2 pills). Will need to assess next visit. If on 2 pills, may consider adding finasteride after assessing PSA to continue to decrease nocturic episodes. FU in 1 month.

## 2010-11-28 NOTE — Consult Note (Signed)
NAMENASIRE, REALI NO.:  [id number]  MEDICAL RECORD NO.:  [id number]  LOCATION:  2905                         FACILITY:  [facility]  PHYSICIAN:  Pearlean Brownie, M.D.DATE OF BIRTH:  Aug 31, 1940  DATE OF CONSULTATION:  10/18/2010 DATE OF DISCHARGE:                                CONSULTATION   PRIMARY CARE PROVIDER:  Renold Don, MD, at the [facility].  PRIMARY TEAM:  [facility] via Dr. Patty Sermons.  HISTORY OF PRESENT ILLNESS:  This is a 70 year old male with past medical history of COPD, coronary artery disease status post three- vessel CABG with subsequent MI x6 who presented today to [facility] ED as a code STEMI.  The patient was taken emergently to the Cath Lab for cardiac catheterization and Family Practice Teaching Service was consulted for management of secondary chronic medical problems.  The patient is currently being seen after cardiac catheterization.  The patient reports that he had substernal chest pain early this morning with radiation to the left jaw as well as significant diaphoresis.  EMS was subsequently called and process occurred as described above.  The patient was also recently seen at [facility] for a concomitant COPD, CHF exacerbation on October 14, 2010, as well as October 16, 2010.  The patient was placed on antibiotics which was doxycycline with a tentative stop date of October 20, 2010, for a total of 7-day course. The patient was placed on double Lasix dose for approximately 1 week to help with adequate diuresis.  The patient currently denies any shortness of breath or cough since clinical followup on October 16, 2010.  Overall, the patient states that symptoms are currently well controlled.  The patient is noted to be on approximately 3 days left of his doxycycline.  ALLERGIES:  VALIUM.  PAST MEDICAL HISTORY: 1. History of bacterial endocarditis. 2. History of septic sternoclavicular joint 3.  History of MRSA bacteremia. 4. History of MRSA cellulitis. 5. Chronic systolic heart failure. 6. COPD. 7. Hypertension. 8. Depression. 9. Type 2 diabetes. 10.Diverticulosis. 11.Coronary artery disease, status post three-vessel CABG and stent     placement and subsequent myocardial infarction x6. 12.History of DVT, currently being anticoagulated with Coumadin.  PAST SURGICAL HISTORY: 1. Hip surgery bilaterally. 2. Rotator cuff repair. 3. Left carotid endarterectomy in 1995. 4. Cervical fusion in 2002. 5. Right elbow surgery x2. 6. Right carpal tunnel release.  SOCIAL HISTORY:  The patient currently lives in [place] with his wife and is currently retired.  He previously smoked heavily, quit 20 years ago.  He denies alcohol, tobacco, or drug use.  The patient is currently not exercising.  REVIEW OF SYSTEMS:  Twelve-point review of systems positive for chest pain which is currently overall stable, otherwise negative except as noted above in the HPI.  PHYSICAL EXAMINATION:  VITAL SIGNS:  Temperature 98, heart rate 84, blood pressure 104/53, and saturating 93% on 2 liters. GENERAL:  The patient is in bed, in no acute distress. HEENT:  Normocephalic and atraumatic.  Extraocular movements intact.  No scleral icterus. NECK:  Large neck. CARDIOVASCULAR:  Regular rate and rhythm.  Decreased heart sounds diffusely.  No murmurs auscultated. PULMONARY:  Clear to auscultation anteriorly.  No wheezes, rales, or rhonchi. ABDOMEN:  Obese abdomen, nontender, positive bowel sounds. EXTREMITIES:  Right leg with 1 and 2+ peripheral pulses; left leg with positive anterior shin erythema and warmth with extremities cool to touch distally. NEURO:  Grossly intact.  The patient is currently supine pint status post cardiac cath protocol.  LABORATORY DATA AND STUDIES: 1. CBC:  White count 20.2, hemoglobin of 14.1, hematocrit 42.1, and     platelet count of 151. 2. INR of 3.71. 3. CMET:  Sodium  135, potassium 4.0, chloride 99, CO2 of 26, BUN 19,     creatinine 1.04, glucose 278, T-bili 0.7, ALT 58, AST 26, ALT 24,     total protein 6.4, albumin 3.2, and calcium 9.0. 4. Initial troponin of 1.84. 5. Chest x-ray showing no acute abnormalities with chronic     cardiomegaly and scarring at the left lung base.  ASSESSMENT/PLAN:  This is a 70 year old male with multiple comorbidities including chronic obstructive pulmonary disease, congestive heart failure, extensive coronary artery disease status post coronary artery bypass grafting and subsequent myocardial infarctions presenting with code ST-segment elevation myocardial infarction. 1. Chest pain, per Primary Team.  The patient is currently status post     cardiac catheterization with tentative the plan to continue medical     management per documentation.  The patient is also pending risk     stratification.  We will defer this to Primary Team. 2. Congestive heart failure.  The patient has a known history of     systolic heart failure with a known ejection fraction of 45-50% on     echocardiogram in September 2011.  The patient was recently treated     for CHF exacerbation.  We will check a BMP today to assess the need     for any additional diuresis.  We will continue the patient on home     Basic otherwise. 3. Hypertension.  Hypertension is currently well controlled on home     medication regimen.  We will continue to follow and adjust as     needed in conjunction with Cardiology. 4. Diabetes.  We will place the patient on home Humulin as well as     sensitive sliding scale.  We will follow CBGs closely. 5. History of deep vein thrombosis.  INR is currently 3.7 today.     Currently, the patient is mildly subtherapeutic.  Given the     patient's history of vascular disease as well as noted erythema or     warmth on the left lower extremity, we will check ABIs to assess     vascularity of overall tissue as the patient is noted to  have     multiple vascular surgeries.  If this is showing any acute     occlusion of vascular supply, will call Vascular Surgery for this.     Given that the patient is currently supratherapeutic on his INR, we     will hold off on lower extremity ultrasounds as this will be of     little benefit given the patient's current anticoagulation status.     However, we will consider CTA or V/Q scan if the patient's symptoms     do deteriorate.  The patient's current D-dimer is currently 0.41     which is also reassuring. 6. History of methicillin-resistant Staphylococcus aureus cellulitis.     The patient currently has no signs of infection and elevated white     blood  cell count on the patient's CBC.  It is likely secondary to     recent steroid use.  We will follow fever curve closely given     history of endocarditis as well as have a very low clinical     threshold to begin antibiotic coverage given the patient's recent     history. 7. Right shoulder pain.  The patient has a history of rotator cuff     repair, also a history of adjacent septic sternoclavicular joint.     We  will continue home Percocet.  We will also placed a Lidoderm     patch over the affected area for pain management. 8. Disposition.  Pending further evaluation.     Doree Albee, MD   ______________________________ Pearlean Brownie, M.D.    SN/MEDQ  D:  10/18/2010  T:  10/19/2010  Job:  [id number]  Electronically Signed by Doree Albee  on 11/11/2010 02:41:41 PM Electronically Signed by Pearlean Brownie M.D. on 11/28/2010 01:59:40 PM

## 2010-11-30 ENCOUNTER — Encounter: Payer: Self-pay | Admitting: Internal Medicine

## 2010-11-30 ENCOUNTER — Emergency Department (HOSPITAL_COMMUNITY)
Admission: EM | Admit: 2010-11-30 | Discharge: 2010-11-30 | Disposition: A | Discharge status: Home / Self Care | Payer: Medicare HMO | Attending: Emergency Medicine | Admitting: Emergency Medicine

## 2010-11-30 ENCOUNTER — Emergency Department (HOSPITAL_COMMUNITY): Payer: Medicare HMO

## 2010-11-30 DIAGNOSIS — M79609 Pain in unspecified limb: Secondary | ICD-10-CM | POA: Insufficient documentation

## 2010-11-30 DIAGNOSIS — R0602 Shortness of breath: Secondary | ICD-10-CM | POA: Insufficient documentation

## 2010-11-30 DIAGNOSIS — E119 Type 2 diabetes mellitus without complications: Secondary | ICD-10-CM | POA: Insufficient documentation

## 2010-11-30 DIAGNOSIS — E7800 Pure hypercholesterolemia, unspecified: Secondary | ICD-10-CM | POA: Insufficient documentation

## 2010-11-30 DIAGNOSIS — I10 Essential (primary) hypertension: Secondary | ICD-10-CM | POA: Insufficient documentation

## 2010-11-30 DIAGNOSIS — Z794 Long term (current) use of insulin: Secondary | ICD-10-CM | POA: Insufficient documentation

## 2010-11-30 DIAGNOSIS — I509 Heart failure, unspecified: Secondary | ICD-10-CM | POA: Insufficient documentation

## 2010-11-30 DIAGNOSIS — R609 Edema, unspecified: Secondary | ICD-10-CM | POA: Insufficient documentation

## 2010-11-30 DIAGNOSIS — I252 Old myocardial infarction: Secondary | ICD-10-CM | POA: Insufficient documentation

## 2010-11-30 DIAGNOSIS — Z79899 Other long term (current) drug therapy: Secondary | ICD-10-CM | POA: Insufficient documentation

## 2010-11-30 DIAGNOSIS — I872 Venous insufficiency (chronic) (peripheral): Secondary | ICD-10-CM | POA: Insufficient documentation

## 2010-11-30 DIAGNOSIS — J438 Other emphysema: Secondary | ICD-10-CM | POA: Insufficient documentation

## 2010-11-30 DIAGNOSIS — R0682 Tachypnea, not elsewhere classified: Secondary | ICD-10-CM | POA: Insufficient documentation

## 2010-11-30 DIAGNOSIS — Z86718 Personal history of other venous thrombosis and embolism: Secondary | ICD-10-CM | POA: Insufficient documentation

## 2010-11-30 LAB — URINALYSIS, ROUTINE W REFLEX MICROSCOPIC
Glucose, UA: 500 mg/dL — AB
Hgb urine dipstick: NEGATIVE
Ketones, ur: NEGATIVE mg/dL
Leukocytes, UA: NEGATIVE
Protein, ur: NEGATIVE mg/dL
pH: 6.5 (ref 5.0–8.0)

## 2010-11-30 LAB — DIFFERENTIAL
Basophils Relative: 0 % (ref 0–1)
Eosinophils Absolute: 0.2 10*3/uL (ref 0.0–0.7)
Lymphs Abs: 1.6 10*3/uL (ref 0.7–4.0)
Monocytes Relative: 10 % (ref 3–12)
Neutro Abs: 7 10*3/uL (ref 1.7–7.7)
Neutrophils Relative %: 71 % (ref 43–77)

## 2010-11-30 LAB — COMPREHENSIVE METABOLIC PANEL
AST: 14 U/L (ref 0–37)
Albumin: 3.6 g/dL (ref 3.5–5.2)
Alkaline Phosphatase: 66 U/L (ref 39–117)
BUN: 25 mg/dL — ABNORMAL HIGH (ref 6–23)
CO2: 26 mEq/L (ref 19–32)
Chloride: 102 mEq/L (ref 96–112)
Creatinine, Ser: 1.14 mg/dL (ref 0.50–1.35)
GFR calc non Af Amer: >60 mL/min (ref >60)
Potassium: 4.9 mEq/L (ref 3.5–5.1)
Total Bilirubin: 0.3 mg/dL (ref 0.3–1.2)

## 2010-11-30 LAB — CBC
Hemoglobin: 12.7 g/dL — ABNORMAL LOW (ref 13.0–17.0)
MCH: 27.1 pg (ref 26.0–34.0)
MCV: 81.2 fL (ref 78.0–100.0)
Platelets: 166 10*3/uL (ref 150–400)
RBC: 4.69 MIL/uL (ref 4.22–5.81)
WBC: 9.9 10*3/uL (ref 4.0–10.5)

## 2010-11-30 LAB — PRO B NATRIURETIC PEPTIDE: Pro B Natriuretic peptide (BNP): 374.1 pg/mL — ABNORMAL HIGH (ref 0–125)

## 2010-11-30 NOTE — Progress Notes (Signed)
Patient was seen in the ED on 11/30/2010 for bilateral leg pain. Patient had weeping wound on his left lower extremity along with 2-3+ pitting edema bilaterally. Patient did not complain of any increased shortness of breath, fever, chills or any changes in diet or appetite. He was recently seen in the clinic by Dr Gwendolyn Grant and the chronic venous stasis changes were noted. The labs in the ED were unremarkable and patient was assessed to be safe enough to discharge home. Instructions were given to increase his lasix to 40 mg po bid for next few days until seen in the family practice clinic next week. Patient was also instructed to elevate his left leg above heart level to decrease the amount of swelling.

## 2010-12-04 ENCOUNTER — Encounter: Payer: Self-pay | Admitting: Family Medicine

## 2010-12-04 ENCOUNTER — Ambulatory Visit (INDEPENDENT_AMBULATORY_CARE_PROVIDER_SITE_OTHER): Payer: Medicare HMO | Admitting: Family Medicine

## 2010-12-04 ENCOUNTER — Ambulatory Visit (INDEPENDENT_AMBULATORY_CARE_PROVIDER_SITE_OTHER): Payer: Medicare HMO | Admitting: *Deleted

## 2010-12-04 VITALS — BP 113/72 | HR 78 | Temp 97.9°F | Wt 298.0 lb

## 2010-12-04 DIAGNOSIS — I872 Venous insufficiency (chronic) (peripheral): Secondary | ICD-10-CM | POA: Insufficient documentation

## 2010-12-04 DIAGNOSIS — L97909 Non-pressure chronic ulcer of unspecified part of unspecified lower leg with unspecified severity: Secondary | ICD-10-CM

## 2010-12-04 DIAGNOSIS — Z8672 Personal history of thrombophlebitis: Secondary | ICD-10-CM

## 2010-12-04 DIAGNOSIS — Z7901 Long term (current) use of anticoagulants: Secondary | ICD-10-CM

## 2010-12-04 DIAGNOSIS — I83009 Varicose veins of unspecified lower extremity with ulcer of unspecified site: Secondary | ICD-10-CM

## 2010-12-04 LAB — CBC
HCT: 43.9 % (ref 39.0–52.0)
MCHC: 32.8 g/dL (ref 30.0–36.0)
MCV: 83.8 fL (ref 78.0–100.0)
Platelets: 196 10*3/uL (ref 150–400)
RDW: 18 % — ABNORMAL HIGH (ref 11.5–15.5)
WBC: 12.6 10*3/uL — ABNORMAL HIGH (ref 4.0–10.5)

## 2010-12-04 MED ORDER — DOXYCYCLINE HYCLATE 100 MG PO TABS: 100.0000 mg | ORAL_TABLET | Freq: Two times a day (BID) | ORAL | Status: AC

## 2010-12-04 NOTE — Patient Instructions (Signed)
Follow-up tomorrow to re-examine your leg.  Keep leg elevated above heart level as much as you can.  Take the antibiotic.  If you have any fevers/chills, worsening chest pain and difficulty breathing, or other worrisome symptoms of infection in that leg, go to the ED immediately.

## 2010-12-04 NOTE — Assessment & Plan Note (Addendum)
Una boot placed on patient today. Recommended patient be admitted as inpatient for labs, IV antibiotics, and monitoring, but patient resistant to idea. His wife has been very ill for the past 4-5 days. Patient is amenable to daily follow-up visits in clinic and given red flags that should prompt giving to the ED. Will start doxycycline due to likely cellulitis. Will check uric acid to rule-out gout. His left foot appears swollen and is tender, which is not typical with venous stasis ulcers. Will give short-course of steroids if uric acid elevated. Will also check CBC and ESR.   Strongly encouraged patient to keep leg elevated at heart level for most of the day.   Update: labs significant for elevated WBC and ESR. Gout unlikely with normal uric acid.

## 2010-12-04 NOTE — Progress Notes (Signed)
  Subjective:    Patient ID: [name], male    DOB: [birth year], 70 y.o.   MRN: [id number]  HPI 1. Left leg pain and weakness Started last week but swelling and weepiness getting worse Seen in the ED a few days ago. Diagnosed with stasis dermatitis and sent home with pain medication. CXR done at that time due to increased SOB showed pulmonary vascular congestion without edema and mild chronic peribronchial thickening Pain is currently 5/10. Takes Tylenol #3 once daily which helps pain.   Review of Systems Denies fevers, chills, nause/avomiting, new chest pain or dyspnea different from chronic     Objective:   Physical Exam Gen: appears mildly uncomfortable Psych: engaged, appropriate to questions CV: RRR, soft heart sounds (large chest cavity), no murmurs or gallops Pulm: CTAB, coarse BS at bases, no wheezes or rales, some mildly increased WOB Skin: warm extremities, 2-3 sec capillary refill Legs: 1+ pedal pulses    Right: chronic reddish/mottling appearance in pretibial area; 2+ pretibial edema   Left: 10x15cm ulcer pretibial area with surrounding erythema, moderate TTP over this area, "anaerobic smell" to ulcer, pedal swelling and TTP along instep but with intact ROM compared to right foot     Assessment & Plan:

## 2010-12-05 ENCOUNTER — Ambulatory Visit (INDEPENDENT_AMBULATORY_CARE_PROVIDER_SITE_OTHER): Payer: Medicare HMO | Admitting: Family Medicine

## 2010-12-05 ENCOUNTER — Encounter: Payer: Self-pay | Admitting: Family Medicine

## 2010-12-05 DIAGNOSIS — L97909 Non-pressure chronic ulcer of unspecified part of unspecified lower leg with unspecified severity: Secondary | ICD-10-CM

## 2010-12-05 NOTE — Patient Instructions (Signed)
Come back and see [place] in 1 week.  It doesn't need to be me.   We will change your Unna boot at that time. Make sure you keep taking the antibiotic and pain medicine.  Keep taking the increased fluid pill as well.   It looks good.

## 2010-12-06 NOTE — Progress Notes (Signed)
  Subjective:    Patient ID: [name], male    DOB: 26-Jun-1940, 70 y.o.   MRN: [id number]  HPI 1.  FU LE edema and erythema:  Patient seen in clinic yesterday for LE edema/erythema of Left leg.  Recently treated in ED as well for same.  Wrapped with Unna boot yesterday, started on Doxycycline for treatment.  Pain improving, still present but controlled with Tylenol #3.  Still some weeping from wound.  No nausea, vomiting, abdominal pain, chest pain, shortness of breath.     Review of Systems See HPI above for review of systems.       Objective:   Physical Exam Gen:  Alert, cooperative patient who appears stated age in no acute distress.  Vital signs reviewed. Left Leg:  Unna boot cut off.  10 x 12 ulcer, weeping, noted pretibial area.  Examined with Dr. Perley Jain, who saw yesterday as well.  +2 edema above level of Unna boot to knee.  +1 edema where Unna boot was wrapped.   CV:  Good distal pulses Neuro:  Good motor and sensation in Left foot.        Assessment & Plan:

## 2010-12-06 NOTE — Assessment & Plan Note (Signed)
Uric acid good.  Continue doxycycline as improvement noted. ESR elevated but appropriate.   To continue with elevation.   Re-wrapped [employer].  Patient to make appt to come back next week for re-wrapping, likely will need several weeks of this.   Again, reviewed with Dr. McDiarmid who saw patient yesterday, re-iterated improvement in just 1 day.  Re-emphasized warnings and red flags to patient, who expressed understanding.

## 2010-12-12 ENCOUNTER — Encounter: Payer: Self-pay | Admitting: Family Medicine

## 2010-12-12 ENCOUNTER — Ambulatory Visit (INDEPENDENT_AMBULATORY_CARE_PROVIDER_SITE_OTHER): Payer: Medicare HMO | Admitting: Family Medicine

## 2010-12-12 VITALS — BP 110/64 | HR 94 | Temp 97.7°F | Ht 68.0 in | Wt 297.0 lb

## 2010-12-12 DIAGNOSIS — L97909 Non-pressure chronic ulcer of unspecified part of unspecified lower leg with unspecified severity: Secondary | ICD-10-CM

## 2010-12-12 DIAGNOSIS — R600 Localized edema: Secondary | ICD-10-CM

## 2010-12-12 DIAGNOSIS — Z23 Encounter for immunization: Secondary | ICD-10-CM

## 2010-12-12 DIAGNOSIS — R0602 Shortness of breath: Secondary | ICD-10-CM

## 2010-12-12 DIAGNOSIS — R609 Edema, unspecified: Secondary | ICD-10-CM

## 2010-12-12 DIAGNOSIS — I83009 Varicose veins of unspecified lower extremity with ulcer of unspecified site: Secondary | ICD-10-CM

## 2010-12-12 DIAGNOSIS — I509 Heart failure, unspecified: Secondary | ICD-10-CM

## 2010-12-12 MED ORDER — FUROSEMIDE 40 MG PO TABS: 40.0000 mg | ORAL_TABLET | Freq: Two times a day (BID) | ORAL | Status: DC

## 2010-12-12 NOTE — Progress Notes (Signed)
  Subjective:    Patient ID: [name], male    DOB: September 05, 1940, 70 y.o.   MRN: [id number]  HPI 1. Follow up left leg ulcer Patient has a healing ulcer on his left shin that is being treated with leg elevation, Unna Boot, and diuretics. Patient has CHF and lower extremity pitting edema. He also has persistent shortness of breath.  The wound is healing, no evidence of ongoing infection. Foul smell, but coming from unwashed feet. DP pulses strong in both LE  2. CHF/LE edema Pt. Has a dx of CHF with EF 35%. He sees Dr. Patty Sermons with cardiology. Hasn't seen his cardiologist in some time. Patient has SOB and lower ext. Edema. He is taking 40 of Lasix daily, but is drinking a lot of fluids. He does not have a lot of salt in his diet. His last Bun/cr was normal.   Review of Systems  Constitutional: Positive for activity change and unexpected weight change. Negative for fever and fatigue.  Eyes: Negative for visual disturbance.  Respiratory: Positive for shortness of breath. Negative for apnea and chest tightness.   Cardiovascular: Positive for leg swelling. Negative for chest pain and palpitations.  Gastrointestinal: Negative for abdominal pain and diarrhea.  Skin: Negative for rash.  Neurological: Negative for dizziness, syncope, facial asymmetry and speech difficulty.       Objective:   Physical Exam  Nursing note and vitals reviewed. Constitutional: No distress.       Obese, breathing shallow and rapidly.  HENT:  Head: Normocephalic and atraumatic.  Cardiovascular: Normal rate and regular rhythm.   Pulmonary/Chest: Effort normal and breath sounds normal. No respiratory distress. He has no wheezes. He has no rales.  Musculoskeletal: He exhibits edema.       Left lower leg: He exhibits tenderness, swelling and edema.       Legs:      Venous stasis ulcer  Skin: He is not diaphoretic.      Assessment & Plan:  1. Follow up left leg ulcer C/W Leg elevation, Unna Boot, and  diuresis. Will increase his lasix to 40mg  BID PO from QD.  2. CHF/LE edema His SOB has progressed as has his LE edema. I want his CHF optimized and I feel this would be best done by his cardiologist Dr. Patty Sermons. Will increase Lasix to 40 BID in the mean time. No HTN at this time.

## 2010-12-20 ENCOUNTER — Ambulatory Visit: Payer: Medicare HMO | Admitting: Family Medicine

## 2010-12-20 ENCOUNTER — Ambulatory Visit (INDEPENDENT_AMBULATORY_CARE_PROVIDER_SITE_OTHER): Payer: Medicare HMO | Admitting: *Deleted

## 2010-12-20 DIAGNOSIS — Z7901 Long term (current) use of anticoagulants: Secondary | ICD-10-CM

## 2010-12-20 DIAGNOSIS — Z8672 Personal history of thrombophlebitis: Secondary | ICD-10-CM

## 2010-12-20 LAB — CBC
HCT: 37.8 — ABNORMAL LOW
Hemoglobin: 12.5 — ABNORMAL LOW
MCHC: 32.5
MCHC: 33.1
MCV: 82.4
Platelets: 175
RBC: 4.28
RDW: 17.9 — ABNORMAL HIGH
RDW: 18 — ABNORMAL HIGH

## 2010-12-20 LAB — DIFFERENTIAL
Eosinophils Absolute: 0.2
Eosinophils Relative: 2
Lymphocytes Relative: 16
Lymphs Abs: 1.5
Monocytes Absolute: 0.8
Monocytes Relative: 8

## 2010-12-20 LAB — BASIC METABOLIC PANEL
CO2: 27
Calcium: 9.1
Creatinine, Ser: 1.41
GFR calc Af Amer: >60
Glucose, Bld: 123 — ABNORMAL HIGH

## 2010-12-20 LAB — CARDIAC PANEL(CRET KIN+CKTOT+MB+TROPI)
CK, MB: 2.4
Relative Index: INVALID
Total CK: 56

## 2010-12-20 LAB — CK TOTAL AND CKMB (NOT AT ARMC): CK, MB: 2.5

## 2010-12-20 LAB — PROTIME-INR
INR: 1.8 — ABNORMAL HIGH
Prothrombin Time: 17.2 — ABNORMAL HIGH
Prothrombin Time: 19 — ABNORMAL HIGH
Prothrombin Time: 21.8 — ABNORMAL HIGH

## 2010-12-20 LAB — POCT I-STAT, CHEM 8
Chloride: 107
Creatinine, Ser: 0.9
Hemoglobin: 13.3
Potassium: 3.8
Sodium: 142

## 2010-12-27 ENCOUNTER — Ambulatory Visit (INDEPENDENT_AMBULATORY_CARE_PROVIDER_SITE_OTHER): Payer: Medicare HMO | Admitting: *Deleted

## 2010-12-27 DIAGNOSIS — I83009 Varicose veins of unspecified lower extremity with ulcer of unspecified site: Secondary | ICD-10-CM

## 2010-12-27 NOTE — Progress Notes (Signed)
Patient had unna boot placed on 12/20/2010 in this office . He has returned today for recheck. Unna boot removed. ulcerartion on top of left leg is scabbed over and dry.  Dr. Sheffield Slider came in to look at leg and advised to leave off [facility]. Gave him RX for compression hose.

## 2010-12-27 NOTE — Progress Notes (Signed)
  Subjective:    Patient ID: [name], male    DOB: 1940-10-01, 70 y.o.   MRN: [id number]  HPI    Review of Systems     Objective:   Physical Exam        Assessment & Plan:

## 2010-12-27 NOTE — Assessment & Plan Note (Signed)
Healed

## 2011-01-03 ENCOUNTER — Ambulatory Visit (INDEPENDENT_AMBULATORY_CARE_PROVIDER_SITE_OTHER): Payer: Medicare HMO | Admitting: *Deleted

## 2011-01-03 DIAGNOSIS — Z8672 Personal history of thrombophlebitis: Secondary | ICD-10-CM

## 2011-01-03 DIAGNOSIS — Z7901 Long term (current) use of anticoagulants: Secondary | ICD-10-CM

## 2011-01-07 LAB — I-STAT 8, (EC8 V) (CONVERTED LAB)
Acid-Base Excess: 3 — ABNORMAL HIGH
BUN: 13
Chloride: 105
HCT: 39
Operator id: 288331
Potassium: 4.3
pCO2, Ven: 34 — ABNORMAL LOW
pH, Ven: 7.486 — ABNORMAL HIGH

## 2011-01-07 LAB — CBC
HCT: 36.5 — ABNORMAL LOW
Hemoglobin: 11.8 — ABNORMAL LOW
MCHC: 32.3
RDW: 21.8 — ABNORMAL HIGH

## 2011-01-07 LAB — DIFFERENTIAL
Basophils Absolute: 0
Eosinophils Absolute: 0.3
Lymphs Abs: 1.5
Monocytes Absolute: 0.8 — ABNORMAL HIGH
Neutro Abs: 7.3

## 2011-01-16 ENCOUNTER — Ambulatory Visit: Payer: Medicare HMO

## 2011-01-16 ENCOUNTER — Other Ambulatory Visit: Payer: Self-pay | Admitting: *Deleted

## 2011-01-24 ENCOUNTER — Ambulatory Visit (INDEPENDENT_AMBULATORY_CARE_PROVIDER_SITE_OTHER): Payer: Medicare HMO | Admitting: *Deleted

## 2011-01-24 ENCOUNTER — Other Ambulatory Visit: Payer: Self-pay | Admitting: Family Medicine

## 2011-01-24 DIAGNOSIS — Z8672 Personal history of thrombophlebitis: Secondary | ICD-10-CM

## 2011-01-24 DIAGNOSIS — Z7901 Long term (current) use of anticoagulants: Secondary | ICD-10-CM

## 2011-01-24 LAB — POCT INR: INR: 2.8

## 2011-01-24 NOTE — Telephone Encounter (Signed)
Refill request

## 2011-02-07 ENCOUNTER — Ambulatory Visit (INDEPENDENT_AMBULATORY_CARE_PROVIDER_SITE_OTHER): Payer: Medicare HMO | Admitting: *Deleted

## 2011-02-07 DIAGNOSIS — Z8672 Personal history of thrombophlebitis: Secondary | ICD-10-CM

## 2011-02-07 DIAGNOSIS — Z7901 Long term (current) use of anticoagulants: Secondary | ICD-10-CM

## 2011-02-07 LAB — POCT INR: INR: 2.7

## 2011-02-18 ENCOUNTER — Encounter: Payer: Self-pay | Admitting: Family Medicine

## 2011-02-18 ENCOUNTER — Ambulatory Visit (INDEPENDENT_AMBULATORY_CARE_PROVIDER_SITE_OTHER): Payer: Medicare HMO | Admitting: Family Medicine

## 2011-02-18 ENCOUNTER — Ambulatory Visit (INDEPENDENT_AMBULATORY_CARE_PROVIDER_SITE_OTHER): Payer: Medicare HMO | Admitting: *Deleted

## 2011-02-18 DIAGNOSIS — M7500 Adhesive capsulitis of unspecified shoulder: Secondary | ICD-10-CM

## 2011-02-18 DIAGNOSIS — Z8672 Personal history of thrombophlebitis: Secondary | ICD-10-CM

## 2011-02-18 DIAGNOSIS — I8310 Varicose veins of unspecified lower extremity with inflammation: Secondary | ICD-10-CM

## 2011-02-18 DIAGNOSIS — Z7901 Long term (current) use of anticoagulants: Secondary | ICD-10-CM

## 2011-02-18 MED ORDER — ACETAMINOPHEN-CODEINE #3 300-30 MG PO TABS: 1.0000 | ORAL_TABLET | Freq: Four times a day (QID) | ORAL | Status: DC | PRN

## 2011-02-18 NOTE — Assessment & Plan Note (Signed)
No evidence of infection today. Refill of Tylenol #3 which helps with his pain. Did discuss continuing home physical therapy along with pain medication to help with mobility of the joints.

## 2011-02-18 NOTE — Assessment & Plan Note (Signed)
Recommended patient to continue using elastic compression stockings. Discussed that Lasix is more for trouble breathing and not as helpful for lower extremity edema.

## 2011-02-18 NOTE — Patient Instructions (Signed)
Come back and see me in 1 month for Geriatrics clinic.

## 2011-02-18 NOTE — Progress Notes (Signed)
  Subjective:    Patient ID: [name], male    DOB: [birth year], 70 y.o.   MRN: [id number]  HPI  1.  Shoulder pain: Chronic problem for patient. He does not feel this is getting any better or worse. Has had multiple workups for this. Has been seen at sports medicine was orthopedics. No fevers or chills. No erythema of the shoulder chest.  2.  LE edema/chronic venous stasis ulcer:  Patient is to be removed about 2 months ago. He is keeping area clean. His wife is applying Vaseline to the area and he states this helps. Still taking Lasix 40 mg twice a day when needed. Otherwise taking Lasix one a day. Does not have the compression stockings. No fevers or chills. No redness of the area.  Med reconciliation: Patient did not bring a list of his medications today. We reviewed his medications but I am still unsure exactly everything is taking. We will schedule him today for follow up with geriatrics clinic with me in one month.  Review of Systems See HPI above for review of systems.       Objective:   Physical Exam Gen:  Alert, cooperative patient who appears stated age in no acute distress.  Vital signs reviewed.  Obese Shoulder: Decreased internal and external rotation. Exam somewhat limited by pain. Tender to palpation. No erythema over joint. No redness. Neuro extremities: Right lower extremity obese. Left lower extremity with chronic venous stasis changes. Actually appears improved on exam. +3 edema left calf to the knee. +2 edema right calf to mid shin.       Assessment & Plan:

## 2011-02-21 ENCOUNTER — Ambulatory Visit: Payer: Medicare HMO

## 2011-02-27 ENCOUNTER — Encounter: Payer: Self-pay | Admitting: Cardiology

## 2011-02-27 ENCOUNTER — Ambulatory Visit (INDEPENDENT_AMBULATORY_CARE_PROVIDER_SITE_OTHER): Payer: Medicare HMO | Admitting: Cardiology

## 2011-02-27 VITALS — BP 130/70 | HR 66 | Ht 68.0 in | Wt 297.0 lb

## 2011-02-27 DIAGNOSIS — I259 Chronic ischemic heart disease, unspecified: Secondary | ICD-10-CM

## 2011-02-27 DIAGNOSIS — J449 Chronic obstructive pulmonary disease, unspecified: Secondary | ICD-10-CM

## 2011-02-27 DIAGNOSIS — I8310 Varicose veins of unspecified lower extremity with inflammation: Secondary | ICD-10-CM

## 2011-02-27 DIAGNOSIS — E119 Type 2 diabetes mellitus without complications: Secondary | ICD-10-CM

## 2011-02-27 DIAGNOSIS — E7800 Pure hypercholesterolemia, unspecified: Secondary | ICD-10-CM

## 2011-02-27 DIAGNOSIS — I2510 Atherosclerotic heart disease of native coronary artery without angina pectoris: Secondary | ICD-10-CM

## 2011-02-27 DIAGNOSIS — I119 Hypertensive heart disease without heart failure: Secondary | ICD-10-CM

## 2011-02-27 NOTE — Assessment & Plan Note (Signed)
The patient has a past history of venous stasis dermatitis and leg ulcer.  This is being followed at the family practice Center.  Patient reports that the size of the ulcer behind the left lower leg is improving.

## 2011-02-27 NOTE — Patient Instructions (Signed)
Your physician recommends that you continue on your current medications as directed. Please refer to the Current Medication list given to you today. Your physician wants you to follow-up in: 4 months You will receive a reminder letter in the mail two months in advance. If you don't receive a letter, please call our office to schedule the follow-up appointment.  

## 2011-02-27 NOTE — Progress Notes (Signed)
[name] Date of Birth:  13-Nov-1940 [facility] [address]   [address] [place], [place]  [address] [phone]           Fax   [phone]  History of Present Illness:  this pleasant 70 year old gentleman is seen after a long absence.  We last saw him in our office in September of 2010 we recently saw him in the hospital during an exacerbation of his dyspnea he is followed primarily at the family practice Center.  He is on long-term warfarin and his prothrombin times are followed through the family practice Center.  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  He has been on long-term Coumadin because of angina decubitus.  In addition the patient has exogenous obesity hypercholesterolemia and diabetes he also has a past history of osteoarthritis and difficulty with ambulation.  Current Outpatient Prescriptions  Medication Sig Dispense Refill  . acetaminophen-codeine (TYLENOL #3) 300-30 MG per tablet Take 1 tablet by mouth every 6 (six) hours as needed for pain.  45 tablet  2  . ALPRAZolam (XANAX) 0.5 MG tablet Take 0.5 mg by mouth every 12 (twelve) hours as needed.        Marland Kitchen aspirin (BABY ASPIRIN) 81 MG chewable tablet Chew 81 mg by mouth daily.        . bisoprolol (ZEBETA) 5 MG tablet Take 2.5 mg by mouth 2 (two) times daily.       . furosemide (LASIX) 40 MG tablet Take 1 tablet (40 mg total) by mouth 2 (two) times daily.  60 tablet  3  . glucose blood (ONE TOUCH ULTRA TEST) test strip 1 each by Other route as needed. check daily as directed daily icd9 code250.00       . insulin NPH (HUMULIN N) 100 UNIT/ML injection        . isosorbide mononitrate (IMDUR) 30 MG 24 hr tablet Take 15 mg by mouth daily.       Marland Kitchen lisinopril (PRINIVIL,ZESTRIL) 10 MG tablet Take 10 mg by mouth daily.        . Misc. Devices [facility]) MISC by Does not apply route. powerized wheelchair       . nitroGLYCERIN  (NITROSTAT) 0.4 MG SL tablet Place 0.4 mg under the tongue as needed.        . ONE TOUCH LANCETS MISC by Does not apply route. check daily as directed  icd9 code 250.00       . simvastatin (ZOCOR) 80 MG tablet Take 80 mg by mouth at bedtime.        Marland Kitchen spironolactone (ALDACTONE) 25 MG tablet Take 25 mg by mouth daily. 1/2 tab daily       . Tamsulosin HCl (FLOMAX) 0.4 MG CAPS TAKE 1 CAPSULE BY MOUTH EVERY DAY AFTER BREAKFAST  90 capsule  0  . tiotropium (SPIRIVA) 18 MCG inhalation capsule Place 1 capsule (18 mcg total) into inhaler and inhale daily.  30 capsule  12  . venlafaxine (EFFEXOR XR) 37.5 MG 24 hr capsule Take 1 capsule (37.5 mg total) by mouth daily.  30 capsule  11  . warfarin (COUMADIN) 5 MG tablet TAKE AS DIRECTED  102 tablet  2  . warfarin (COUMADIN) 7.5 MG tablet Take 7.5 mg by mouth as directed.        Marland Kitchen DISCONTD: HUMULIN N 100 UNIT/ML injection INJECT 65 UNITS UNDER THE SKIN TWICE DAILY  40 mL  3  Allergies  Allergen Reactions  . Diazepam     REACTION: unspecified    Patient Active Problem List  Diagnoses  . DIABETES MELLITUS, TYPE II  . DIABETIC PERIPHERAL NEUROPATHY  . HYPERCHOLESTEROLEMIA  . ANXIETY  . DEPRESSION  . HYPERTENSION  . CORONARY, ARTERIOSCLEROSIS  . CHF - EJECTION FRACTION < 50%  . COPD  . REFLUX ESOPHAGITIS  . FROZEN RIGHT SHOULDER  . APNEA, SLEEP  . Encounter for long-term (current) use of anticoagulants  . BPH (benign prostatic hyperplasia)  . Venous stasis dermatitis    History  Smoking status  . Former Smoker  . Quit date: 06/19/2000  Smokeless tobacco  . Not on file    History  Alcohol Use No    Family History  Problem Relation Age of Onset  . Heart disease Father     Review of Systems: Constitutional: no fever chills diaphoresis or fatigue or change in weight.  Head and neck: no hearing loss, no epistaxis, no photophobia or visual disturbance. Respiratory: No cough, shortness of breath or wheezing. Cardiovascular: No  chest pain peripheral edema, palpitations. Gastrointestinal: No abdominal distention, no abdominal pain, no change in bowel habits hematochezia or melena. Genitourinary: No dysuria, no frequency, no urgency, no nocturia. Musculoskeletal:No arthralgias, no back pain, no gait disturbance or myalgias. Neurological: No dizziness, no headaches, no numbness, no seizures, no syncope, no weakness, no tremors. Hematologic: No lymphadenopathy, no easy bruising. Psychiatric: No confusion, no hallucinations, no sleep disturbance.    Physical Exam: Filed Vitals:   02/27/11 1321  BP: 130/70  Pulse: 66   The general appearance shows a large gentleman in no acute distress.  He walks with difficulty because of his musculoskeletal limitations and arthritis.  He also has a right frozen shoulder situation.Pupils equal and reactive.   Extraocular Movements are full.  There is no scleral icterus.  The mouth and pharynx are normal.  The neck is supple.  The carotids reveal no bruits.  The jugular venous pressure is normal.  The thyroid is not enlarged.  There is no lymphadenopathy.  The chest is clear to percussion and auscultation. There are no rales or rhonchi. Expansion of the chest is symmetrical.  Heart reveals no murmur gallop rub or clickThe abdomen is soft and nontender. Bowel sounds are normal. The liver and spleen are not enlarged. There Are no abdominal masses. There are no bruits.  Abdomen is very obese. Extremities reveal no active phlebitis.  He does have a healing ulcer on the back of his left lower leg.Strength is normal and symmetrical in all extremities.  There is no lateralizing weakness.  There are no sensory deficits.  Integument reveals no skin rash    Assessment / Plan: Patient is to continue same medication.  I emphasized the importance of weight loss.  He'll return in 4 months for followup office visit and EKG here.  Continue close monitoring of his Coumadin through the family practice  Center

## 2011-02-27 NOTE — Assessment & Plan Note (Signed)
The patient has a history of COPD.  At the present time he is not having any purulent sputum or fever or signs of exacerbation of bronchitis.  He is a nonsmoker.[name]

## 2011-02-27 NOTE — Assessment & Plan Note (Signed)
The patient has not been expressing any recent chest pain or angina pectoris.  He has not had to take any recent sublingual nitroglycerin.

## 2011-02-27 NOTE — Assessment & Plan Note (Signed)
The patient is diabetic.  He has not been successful in losing weight.  He has not been expressing any recent hypoglycemic episodes.

## 2011-03-13 ENCOUNTER — Encounter (HOSPITAL_COMMUNITY): Payer: Self-pay | Admitting: General Practice

## 2011-03-13 ENCOUNTER — Inpatient Hospital Stay (HOSPITAL_COMMUNITY)
Admission: AD | Admit: 2011-03-13 | Discharge: 2011-03-18 | DRG: 617 | Disposition: A | Discharge status: Home / Self Care | Payer: Medicare HMO | Source: Ambulatory Visit | Attending: Family Medicine | Admitting: Family Medicine

## 2011-03-13 ENCOUNTER — Other Ambulatory Visit: Payer: Self-pay

## 2011-03-13 ENCOUNTER — Ambulatory Visit (INDEPENDENT_AMBULATORY_CARE_PROVIDER_SITE_OTHER): Payer: Medicare HMO | Admitting: Family Medicine

## 2011-03-13 ENCOUNTER — Inpatient Hospital Stay (HOSPITAL_COMMUNITY): Payer: Medicare HMO

## 2011-03-13 VITALS — BP 110/60 | HR 67 | Ht 68.0 in | Wt 298.0 lb

## 2011-03-13 DIAGNOSIS — I10 Essential (primary) hypertension: Secondary | ICD-10-CM | POA: Diagnosis present

## 2011-03-13 DIAGNOSIS — I5022 Chronic systolic (congestive) heart failure: Secondary | ICD-10-CM | POA: Diagnosis present

## 2011-03-13 DIAGNOSIS — M9080 Osteopathy in diseases classified elsewhere, unspecified site: Secondary | ICD-10-CM | POA: Diagnosis present

## 2011-03-13 DIAGNOSIS — L02619 Cutaneous abscess of unspecified foot: Secondary | ICD-10-CM | POA: Diagnosis present

## 2011-03-13 DIAGNOSIS — E118 Type 2 diabetes mellitus with unspecified complications: Secondary | ICD-10-CM

## 2011-03-13 DIAGNOSIS — E1169 Type 2 diabetes mellitus with other specified complication: Principal | ICD-10-CM | POA: Diagnosis present

## 2011-03-13 DIAGNOSIS — Z87891 Personal history of nicotine dependence: Secondary | ICD-10-CM

## 2011-03-13 DIAGNOSIS — I2510 Atherosclerotic heart disease of native coronary artery without angina pectoris: Secondary | ICD-10-CM | POA: Diagnosis present

## 2011-03-13 DIAGNOSIS — L03039 Cellulitis of unspecified toe: Secondary | ICD-10-CM | POA: Diagnosis present

## 2011-03-13 DIAGNOSIS — Z86718 Personal history of other venous thrombosis and embolism: Secondary | ICD-10-CM

## 2011-03-13 DIAGNOSIS — I5032 Chronic diastolic (congestive) heart failure: Secondary | ICD-10-CM | POA: Diagnosis present

## 2011-03-13 DIAGNOSIS — M8610 Other acute osteomyelitis, unspecified site: Secondary | ICD-10-CM

## 2011-03-13 DIAGNOSIS — M869 Osteomyelitis, unspecified: Secondary | ICD-10-CM | POA: Diagnosis present

## 2011-03-13 DIAGNOSIS — I509 Heart failure, unspecified: Secondary | ICD-10-CM | POA: Diagnosis present

## 2011-03-13 DIAGNOSIS — T368X5A Adverse effect of other systemic antibiotics, initial encounter: Secondary | ICD-10-CM | POA: Diagnosis not present

## 2011-03-13 DIAGNOSIS — N400 Enlarged prostate without lower urinary tract symptoms: Secondary | ICD-10-CM | POA: Diagnosis present

## 2011-03-13 DIAGNOSIS — I798 Other disorders of arteries, arterioles and capillaries in diseases classified elsewhere: Secondary | ICD-10-CM | POA: Diagnosis present

## 2011-03-13 DIAGNOSIS — L03032 Cellulitis of left toe: Secondary | ICD-10-CM

## 2011-03-13 DIAGNOSIS — Z6841 Body Mass Index (BMI) 40.0 and over, adult: Secondary | ICD-10-CM

## 2011-03-13 DIAGNOSIS — F329 Major depressive disorder, single episode, unspecified: Secondary | ICD-10-CM

## 2011-03-13 DIAGNOSIS — I252 Old myocardial infarction: Secondary | ICD-10-CM

## 2011-03-13 DIAGNOSIS — J449 Chronic obstructive pulmonary disease, unspecified: Secondary | ICD-10-CM | POA: Diagnosis present

## 2011-03-13 DIAGNOSIS — E1159 Type 2 diabetes mellitus with other circulatory complications: Secondary | ICD-10-CM | POA: Diagnosis present

## 2011-03-13 DIAGNOSIS — I96 Gangrene, not elsewhere classified: Secondary | ICD-10-CM | POA: Diagnosis present

## 2011-03-13 DIAGNOSIS — I959 Hypotension, unspecified: Secondary | ICD-10-CM | POA: Diagnosis present

## 2011-03-13 DIAGNOSIS — Z7901 Long term (current) use of anticoagulants: Secondary | ICD-10-CM

## 2011-03-13 DIAGNOSIS — Z794 Long term (current) use of insulin: Secondary | ICD-10-CM

## 2011-03-13 DIAGNOSIS — J4489 Other specified chronic obstructive pulmonary disease: Secondary | ICD-10-CM | POA: Diagnosis present

## 2011-03-13 DIAGNOSIS — E119 Type 2 diabetes mellitus without complications: Secondary | ICD-10-CM | POA: Diagnosis present

## 2011-03-13 DIAGNOSIS — I872 Venous insufficiency (chronic) (peripheral): Secondary | ICD-10-CM | POA: Diagnosis present

## 2011-03-13 DIAGNOSIS — L97509 Non-pressure chronic ulcer of other part of unspecified foot with unspecified severity: Secondary | ICD-10-CM | POA: Diagnosis present

## 2011-03-13 HISTORY — DX: Sleep apnea, unspecified: G47.30

## 2011-03-13 HISTORY — DX: Shortness of breath: R06.02

## 2011-03-13 HISTORY — DX: Heart failure, unspecified: I50.9

## 2011-03-13 HISTORY — DX: Essential (primary) hypertension: I10

## 2011-03-13 HISTORY — DX: Angina pectoris, unspecified: I20.9

## 2011-03-13 HISTORY — DX: Myoneural disorder, unspecified: G70.9

## 2011-03-13 HISTORY — DX: Chronic kidney disease, unspecified: N18.9

## 2011-03-13 HISTORY — DX: Acute myocardial infarction, unspecified: I21.9

## 2011-03-13 LAB — CBC
HCT: 37.9 % — ABNORMAL LOW (ref 39.0–52.0)
MCHC: 33 g/dL (ref 30.0–36.0)
Platelets: 195 10*3/uL (ref 150–400)
RDW: 17 % — ABNORMAL HIGH (ref 11.5–15.5)

## 2011-03-13 LAB — DIFFERENTIAL
Eosinophils Absolute: 0.3 10*3/uL (ref 0.0–0.7)
Lymphs Abs: 1.5 10*3/uL (ref 0.7–4.0)
Monocytes Absolute: 1.4 10*3/uL — ABNORMAL HIGH (ref 0.1–1.0)
Monocytes Relative: 12 % (ref 3–12)
Neutrophils Relative %: 74 % (ref 43–77)

## 2011-03-13 LAB — MRSA PCR SCREENING: MRSA by PCR: NEGATIVE

## 2011-03-13 LAB — COMPREHENSIVE METABOLIC PANEL
AST: 16 U/L (ref 0–37)
Albumin: 3.1 g/dL — ABNORMAL LOW (ref 3.5–5.2)
Alkaline Phosphatase: 64 U/L (ref 39–117)
BUN: 22 mg/dL (ref 6–23)
Potassium: 4.1 mEq/L (ref 3.5–5.1)
Sodium: 132 mEq/L — ABNORMAL LOW (ref 135–145)
Total Protein: 6.7 g/dL (ref 6.0–8.3)

## 2011-03-13 LAB — PROTIME-INR: INR: 1.6 — ABNORMAL HIGH (ref 0.00–1.49)

## 2011-03-13 LAB — GLUCOSE, CAPILLARY: Glucose-Capillary: 298 mg/dL — ABNORMAL HIGH (ref 70–99)

## 2011-03-13 MED ORDER — SODIUM CHLORIDE 0.9 % IV SOLN
250.0000 mL | INTRAVENOUS | Status: DC | PRN
  Administered 2011-03-13: 250 mL via INTRAVENOUS

## 2011-03-13 MED ORDER — HEPARIN SODIUM (PORCINE) 5000 UNIT/ML IJ SOLN
5000.0000 [IU] | Freq: Three times a day (TID) | INTRAMUSCULAR | Status: DC
  Administered 2011-03-13 – 2011-03-18 (×15): 5000 [IU] via SUBCUTANEOUS
  Filled 2011-03-13 (×18): qty 1

## 2011-03-13 MED ORDER — VANCOMYCIN HCL 1000 MG IV SOLR
1750.0000 mg | INTRAVENOUS | Status: DC
  Administered 2011-03-14: 1750 mg via INTRAVENOUS
  Filled 2011-03-13 (×2): qty 1750

## 2011-03-13 MED ORDER — ACETAMINOPHEN-CODEINE #3 300-30 MG PO TABS
1.0000 | ORAL_TABLET | ORAL | Status: DC | PRN
  Administered 2011-03-15 – 2011-03-18 (×2): 1 via ORAL
  Filled 2011-03-13 (×2): qty 1

## 2011-03-13 MED ORDER — SODIUM CHLORIDE 0.9 % IJ SOLN
3.0000 mL | Freq: Two times a day (BID) | INTRAMUSCULAR | Status: DC
  Administered 2011-03-13 – 2011-03-17 (×6): 3 mL via INTRAVENOUS

## 2011-03-13 MED ORDER — SODIUM CHLORIDE 0.9 % IJ SOLN: 3.0000 mL | INTRAMUSCULAR | Status: DC | PRN

## 2011-03-13 MED ORDER — VANCOMYCIN HCL 1000 MG IV SOLR
2000.0000 mg | Freq: Once | INTRAVENOUS | Status: AC
  Administered 2011-03-13: 2000 mg via INTRAVENOUS
  Filled 2011-03-13: qty 2000

## 2011-03-13 MED ORDER — DIPHENHYDRAMINE HCL 50 MG/ML IJ SOLN
50.0000 mg | Freq: Four times a day (QID) | INTRAMUSCULAR | Status: DC | PRN
  Administered 2011-03-13 – 2011-03-14 (×2): 50 mg via INTRAVENOUS
  Filled 2011-03-13 (×2): qty 1

## 2011-03-13 MED ORDER — PIPERACILLIN-TAZOBACTAM 3.375 G IVPB
3.3750 g | Freq: Three times a day (TID) | INTRAVENOUS | Status: DC
  Administered 2011-03-14 – 2011-03-18 (×13): 3.375 g via INTRAVENOUS
  Filled 2011-03-13 (×18): qty 50

## 2011-03-13 MED ORDER — ONDANSETRON HCL 4 MG/2ML IJ SOLN: 4.0000 mg | Freq: Three times a day (TID) | INTRAMUSCULAR | Status: DC | PRN

## 2011-03-13 NOTE — Progress Notes (Signed)
ANTIBIOTIC CONSULT NOTE - INITIAL  Pharmacy Consult for vanc/zosyn Indication: Osteomyelitis  Allergies  Allergen Reactions  . Diazepam     REACTION: unspecified   Vital Signs: Temp: 97.4 F (36.3 C) (12/20 1215) BP: 95/58 mmHg (12/20 1215) Pulse Rate: 66  (12/20 1215)  Labs: No results found for this basename: WBC:3,HGB:3,PLT:3,LABCREA:3,CREATININE:3 in the last 72 hours Estimated Creatinine Clearance: 78.9 ml/min (by C-G formula based on Cr of 1.14). No results found for this basename: VANCOTROUGH:2,VANCOPEAK:2,VANCORANDOM:2,GENTTROUGH:2,GENTPEAK:2,GENTRANDOM:2,TOBRATROUGH:2,TOBRAPEAK:2,TOBRARND:2,AMIKACINPEAK:2,AMIKACINTROU:2,AMIKACIN:2, in the last 72 hours   Microbiology: No results found for this or any previous visit (from the past 720 hour(s)).  Medical History: Past Medical History  Diagnosis Date  . Depression   . Diabetes mellitus   . COPD (chronic obstructive pulmonary disease)   . Adhesive capsulitis   . MRSA bacteremia     2011 - possible endocarditis, received 6 weeks IV treatment  . Hyperlipidemia   . Anxiety   . GERD (gastroesophageal reflux disease)   . CAD (coronary artery disease)   . 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 CVA (cerebral infarction) Questionable history  . Diverticulosis   . Osteomyelitis 2012    Sternoclavicular joint   . Chronic back pain     Assessment: Pt was admitted for likely toe osteomyelitis. Wound has increased drainage and swelling over the past few days. IV vanc and zosyn will be started to cover for likely mixed infections.   Pharmacy System-Based Medication Review: Anticoagulation: Heparin 5k for DVT prophylaxis Infectious Disease: Vanc/zosyn D1 for osteo. Cultures pending. Need MRI Neurology: Anxiety, depression Pulmonary: OSA, COPD Cardiovascular:Lipidemia, HTN, CAD, CHF Gastrointestinal: GERD Endocrinology: DM, diabetic neuropathy Fluids/Electrolytes/Nutrition: N/a Nephrology:  BPH Hematology/Oncology:Labs are still pending Best Practices: Heparin SQ  Goal of Therapy:  Vancomycin trough level 15-20 mcg/ml  Plan:  1. Vanc 2g IV x1 then 1.75g IV q24 2. Zosyn 3.375g IV q8  Ulyses [place] 03/13/2011,1:39 PM

## 2011-03-13 NOTE — Progress Notes (Signed)
1900 pt c/o feeling itchy to both forearms . Lower extremities  No seen pt scratchiing  self no sob . With some macular rashes to L thigh . Had complaint after one hour after vanco IVPB started at 1817 . vanco shut off placed a call tp  Family Doctor on call . Responded will evaluate pt . Report given to [facility]

## 2011-03-13 NOTE — H&P (Signed)
I interviewed and examined this patient and discussed the care plan with Dr. Gwendolyn Grant in the [facility] and with the FPTS team and agree with assessment and plan as documented in the H&P note for today.    Weston Fulco A. Sheffield Slider, MD Family Medicine Teaching Service Attending  03/13/2011 6:29 PM

## 2011-03-13 NOTE — Progress Notes (Signed)
Called to start iv for new admission needing iv fluids, abx;  Pt with very limited access; ;is to be on 2 different abx; able to start iv, but suggest central access for this pt;  Please advise ;  Thank you.

## 2011-03-13 NOTE — H&P (Signed)
Family Medicine Teaching [facility] Admission History and Physical  Patient name: [name] Medical record number: [id number] Date of birth: Jun 17, 1940 Age: 70 y.o. Gender: male  Primary Care Provider: Renold Don, MD  Chief Complaint:  Left toe swelling and infection  History of Present Illness: [name] is a 70 y.o. year old male presenting with Left great toe swelling, drainage, and foul odor for 3-4 days.  He has known callus on bottom of great toe and [facility] with healing.  However, wife states that for past several days she has noticed increased limping and looked at foot, noticed worsening of draining as well as deeper wound.  She has been applying OTC topical antibiotic ointments.  He has been complaining of some mild nausea without vomiting, also increasing weakness.  Noticed redness at base of toe, none extending up foot.  No fevers, chills.    Patient Active Problem List  Diagnoses  . DIABETES MELLITUS, TYPE II  . DIABETIC PERIPHERAL NEUROPATHY  . HYPERCHOLESTEROLEMIA  . ANXIETY  . DEPRESSION  . HYPERTENSION  . CORONARY, ARTERIOSCLEROSIS  . CHF - EJECTION FRACTION < 50%  . COPD  . REFLUX ESOPHAGITIS  . FROZEN RIGHT SHOULDER  . APNEA, SLEEP  . Encounter for long-term (current) use of anticoagulants  . BPH (benign prostatic hyperplasia)  . Venous stasis dermatitis  . COPD (chronic obstructive pulmonary disease)   Past Medical History: Past Medical History  Diagnosis Date  . Depression   . Diabetes mellitus   . COPD (chronic obstructive pulmonary disease)   . Adhesive capsulitis   . MRSA bacteremia     2011 - possible endocarditis, received 6 weeks IV treatment  . Hyperlipidemia   . Anxiety   . GERD (gastroesophageal reflux disease)   . CAD (coronary artery disease)   . 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 CVA (cerebral infarction) Questionable history  . Diverticulosis   .  Osteomyelitis 2012    Sternoclavicular joint   . Chronic back pain     Past Surgical History: Past Surgical History  Procedure Date  . Septic arthritis     Removal of infected CABG wire by Dr Edwyna Shell - 2011  . Coronary artery bypass graft 1992  . Fracture surgery 1980s    Hip  . Spine surgery 2002    Cervical fusion vertebroplasty C3-4-5  . Coronary stent placement 1999, 2002  . Doppler echocardiography Sept 2011    EF 50-55% with some impaired diastolic relaxation  . Colonoscopy 2006     Multiple polyps removed, repeat in 5 years  . Right iliopopliteal bypass - 1983 1983  . Femoral-popliteal bypass graft 2008    Left  . Cardiac catheterization October 18, 2010    Known obstructive disease, no further blockages  . Cardiac catheterization 03/17/2005    EF 35-40%  . US echocardiography 03/04/2006    EF 50-55%    Social History: History   Social History  . Marital Status: Married    Spouse Name: N/A    Number of Children: N/A  . Years of Education: N/A   Social History Main Topics  . Smoking status: Former Smoker    Quit date: 06/19/2000  . Smokeless tobacco: Not on file  . Alcohol Use: No  . Drug Use: No  . Sexually Active: No   Other Topics Concern  . Not on file   Social History Narrative  . No  narrative on file    Family History: Family History  Problem Relation Age of Onset  . Heart disease Father     Allergies: Allergies  Allergen Reactions  . Diazepam     REACTION: unspecified    Current Outpatient Prescriptions  Medication Sig Dispense Refill  . acetaminophen-codeine (TYLENOL #3) 300-30 MG per tablet Take 1 tablet by mouth every 6 (six) hours as needed for pain.  45 tablet  2  . aspirin (BABY ASPIRIN) 81 MG chewable tablet Chew 81 mg by mouth daily.        . bisoprolol (ZEBETA) 5 MG tablet Take 5 mg by mouth 2 (two) times daily.       . furosemide (LASIX) 40 MG tablet Take 40 mg by mouth daily.        Marland Kitchen glucose blood (ONE TOUCH ULTRA TEST)  test strip 1 each by Other route as needed. check daily as directed daily icd9 code250.00       . insulin NPH (HUMULIN N) 100 UNIT/ML injection        . isosorbide mononitrate (IMDUR) 30 MG 24 hr tablet Take 15 mg by mouth daily.       Marland Kitchen lisinopril (PRINIVIL,ZESTRIL) 10 MG tablet Take 10 mg by mouth daily.        . Misc. Devices [facility]) MISC by Does not apply route. powerized wheelchair       . ONE TOUCH LANCETS MISC by Does not apply route. check daily as directed  icd9 code 250.00       . simvastatin (ZOCOR) 80 MG tablet Take 80 mg by mouth at bedtime.        Marland Kitchen tiotropium (SPIRIVA) 18 MCG inhalation capsule Place 1 capsule (18 mcg total) into inhaler and inhale daily.  30 capsule  12  . venlafaxine (EFFEXOR XR) 37.5 MG 24 hr capsule Take 1 capsule (37.5 mg total) by mouth daily.  30 capsule  11  . warfarin (COUMADIN) 5 MG tablet TAKE AS DIRECTED  102 tablet  2  . warfarin (COUMADIN) 7.5 MG tablet Take 7.5 mg by mouth as directed.        Marland Kitchen DISCONTD: furosemide (LASIX) 40 MG tablet Take 1 tablet (40 mg total) by mouth 2 (two) times daily.  60 tablet  3  . nitroGLYCERIN (NITROSTAT) 0.4 MG SL tablet Place 0.4 mg under the tongue as needed.        Marland Kitchen spironolactone (ALDACTONE) 25 MG tablet Take 25 mg by mouth daily. 1/2 tab daily       . Tamsulosin HCl (FLOMAX) 0.4 MG CAPS TAKE 1 CAPSULE BY MOUTH EVERY DAY AFTER BREAKFAST  90 capsule  0   Review Of Systems: Per HPI with the following additions: none Otherwise 12 point review of systems was performed and was unremarkable.  Physical Exam: Pulse: 67  Blood Pressure: 110/60 RR: 16   Temp: 98.6  General: alert, cooperative and appears stated age in no acute distress, but does appear to be pale and looks like he doesn't feel well.  Non-toxic appearing. HEENT: PERRLA, extra ocular movement intact, sclera clear, anicteric and oropharynx clear, no lesions Heart: 2/6 SEM is heard at 2nd left intercostal space Lungs: Prolonged expiration and wheezing  noted bibasilar Abdomen: abdomen is soft without significant tenderness, masses, organomegaly or guarding Extremities: Left great toe with 3 cm in diameter ulcer plantar aspect of toe.  Necrotic tissue noted.  Foul odor.  Culture obtained, able to probe to bone.  Overlying skin erythema  to 1st MTP joint.  BL LE edema +2 with chronic skin changes noted Skin:no rashes Neurology: normal without focal findings, mental status, speech normal, alert and oriented x3, PERLA, muscle tone and strength normal and symmetric and reflexes normal and symmetric.  Decreased sensation BL toes.    Labs and Imaging: Lab Results  Component Value Date/Time   NA 136 11/30/2010  4:17 AM   K 4.9 11/30/2010  4:17 AM   CL 102 11/30/2010  4:17 AM   CO2 26 11/30/2010  4:17 AM   BUN 25* 11/30/2010  4:17 AM   CREATININE 1.14 11/30/2010  4:17 AM   CREATININE 1.06 11/01/2010 10:20 AM   GLUCOSE 252* 11/30/2010  4:17 AM   Lab Results  Component Value Date   WBC 12.6* 12/04/2010   HGB 14.4 12/04/2010   HCT 43.9 12/04/2010   MCV 83.8 12/04/2010   PLT 196 12/04/2010    Assessment and Plan: [name] is a 70 y.o. year old male presenting with Left great toe ulcer, concern for osteomyelitis and cellulitis: 1. Osteomyelitis:  Able to probe wound to bone.  Culture obtained in clinic.  Will admit patient to telemetry bed as he has extensive cardiac history.  He will need IV broad-spectrum antibiotics and MRI of that foot.  No clear systemic symptoms yet.   2.  Pain:  Continue Tylenol #3 for pain relief.  Obtain BMET and CBC. 3.  Diastolic CHF:  Continue home regimen.  Note that patient is NOT taking his Aldactone.  I would hold this in house as he has trouble with hypotension.  I would also keep an eye out for hypotension; in past we have had Bisoprolol decreased to 2.5 mg rather than 5, but patient states he's been taking 5 mg at home.  He also is on a heart failure action plan, taking lasix as needed.  Watch creatinine. 4.  Known CAD:   Continue ASA.   5. BPH:  Patient is on Tamsulosin, but states he has not taken this in at least a month.  Would hold while in house to prevent hypotension.  Consider restarting prior to DC with instructions.   6.  DM2:  Last A1C was in July and >9.  Likely cause of chronic wound.  Recheck A1C. 7.  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  Complicates healing of wound.  Follows with [facility] here in [place], known patient.  May need ABI's to eval for ortho versus vascular surgery if toe/foot need to be removed.   8.  COPD:  Continue Spriva in house.  Currently controlled per patient symptom report. 9. FEN/GI: NPO until MRI obtained and ascertain need for surgery 10. Prophylaxis: heparin ppx 11. Disposition: pending further work up  Renold Don MD 03/13/2011 10:11 AM

## 2011-03-13 NOTE — Progress Notes (Signed)
Family Medicine Teaching [facility] Admission History and Physical  Patient name: [name] Medical record number: [id number] Date of birth: 03-May-1940 Age: 70 y.o. Gender: male  Primary Care Provider: Renold Don, MD  Chief Complaint:  Left toe swelling and infection  History of Present Illness: [name] is a 70 y.o. year old male presenting with Left great toe swelling, drainage, and foul odor for 3-4 days.  He has known callus on bottom of great toe and [facility] with healing.  However, wife states that for past several days she has noticed increased limping and looked at foot, noticed worsening of draining as well as deeper wound.  She has been applying OTC topical antibiotic ointments.  He has been complaining of some mild nausea without vomiting, also increasing weakness.  Noticed redness at base of toe, none extending up foot.  No fevers, chills.    Patient Active Problem List  Diagnoses  . DIABETES MELLITUS, TYPE II  . DIABETIC PERIPHERAL NEUROPATHY  . HYPERCHOLESTEROLEMIA  . ANXIETY  . DEPRESSION  . HYPERTENSION  . CORONARY, ARTERIOSCLEROSIS  . CHF - EJECTION FRACTION < 50%  . COPD  . REFLUX ESOPHAGITIS  . FROZEN RIGHT SHOULDER  . APNEA, SLEEP  . Encounter for long-term (current) use of anticoagulants  . BPH (benign prostatic hyperplasia)  . Venous stasis dermatitis  . COPD (chronic obstructive pulmonary disease)   Past Medical History: Past Medical History  Diagnosis Date  . Depression   . Diabetes mellitus   . COPD (chronic obstructive pulmonary disease)   . Adhesive capsulitis   . MRSA bacteremia     2011 - possible endocarditis, received 6 weeks IV treatment  . Hyperlipidemia   . Anxiety   . GERD (gastroesophageal reflux disease)   . CAD (coronary artery disease)   . 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 CVA (cerebral infarction) Questionable history  . Diverticulosis   .  Osteomyelitis 2012    Sternoclavicular joint   . Chronic back pain     Past Surgical History: Past Surgical History  Procedure Date  . Septic arthritis     Removal of infected CABG wire by Dr Edwyna Shell - 2011  . Coronary artery bypass graft 1992  . Fracture surgery 1980s    Hip  . Spine surgery 2002    Cervical fusion vertebroplasty C3-4-5  . Coronary stent placement 1999, 2002  . Doppler echocardiography Sept 2011    EF 50-55% with some impaired diastolic relaxation  . Colonoscopy 2006     Multiple polyps removed, repeat in 5 years  . Right iliopopliteal bypass - 1983 1983  . Femoral-popliteal bypass graft 2008    Left  . Cardiac catheterization October 18, 2010    Known obstructive disease, no further blockages  . Cardiac catheterization 03/17/2005    EF 35-40%  . US echocardiography 03/04/2006    EF 50-55%    Social History: History   Social History  . Marital Status: Married    Spouse Name: N/A    Number of Children: N/A  . Years of Education: N/A   Social History Main Topics  . Smoking status: Former Smoker    Quit date: 06/19/2000  . Smokeless tobacco: Not on file  . Alcohol Use: No  . Drug Use: No  . Sexually Active: No   Other Topics Concern  . Not on file   Social History Narrative  . No  narrative on file    Family History: Family History  Problem Relation Age of Onset  . Heart disease Father     Allergies: Allergies  Allergen Reactions  . Diazepam     REACTION: unspecified    Current Outpatient Prescriptions  Medication Sig Dispense Refill  . acetaminophen-codeine (TYLENOL #3) 300-30 MG per tablet Take 1 tablet by mouth every 6 (six) hours as needed for pain.  45 tablet  2  . aspirin (BABY ASPIRIN) 81 MG chewable tablet Chew 81 mg by mouth daily.        . bisoprolol (ZEBETA) 5 MG tablet Take 5 mg by mouth 2 (two) times daily.       . furosemide (LASIX) 40 MG tablet Take 40 mg by mouth daily.        Marland Kitchen glucose blood (ONE TOUCH ULTRA TEST)  test strip 1 each by Other route as needed. check daily as directed daily icd9 code250.00       . insulin NPH (HUMULIN N) 100 UNIT/ML injection        . isosorbide mononitrate (IMDUR) 30 MG 24 hr tablet Take 15 mg by mouth daily.       Marland Kitchen lisinopril (PRINIVIL,ZESTRIL) 10 MG tablet Take 10 mg by mouth daily.        . Misc. Devices [facility]) MISC by Does not apply route. powerized wheelchair       . ONE TOUCH LANCETS MISC by Does not apply route. check daily as directed  icd9 code 250.00       . simvastatin (ZOCOR) 80 MG tablet Take 80 mg by mouth at bedtime.        Marland Kitchen tiotropium (SPIRIVA) 18 MCG inhalation capsule Place 1 capsule (18 mcg total) into inhaler and inhale daily.  30 capsule  12  . venlafaxine (EFFEXOR XR) 37.5 MG 24 hr capsule Take 1 capsule (37.5 mg total) by mouth daily.  30 capsule  11  . warfarin (COUMADIN) 5 MG tablet TAKE AS DIRECTED  102 tablet  2  . warfarin (COUMADIN) 7.5 MG tablet Take 7.5 mg by mouth as directed.        Marland Kitchen DISCONTD: furosemide (LASIX) 40 MG tablet Take 1 tablet (40 mg total) by mouth 2 (two) times daily.  60 tablet  3  . nitroGLYCERIN (NITROSTAT) 0.4 MG SL tablet Place 0.4 mg under the tongue as needed.        Marland Kitchen spironolactone (ALDACTONE) 25 MG tablet Take 25 mg by mouth daily. 1/2 tab daily       . Tamsulosin HCl (FLOMAX) 0.4 MG CAPS TAKE 1 CAPSULE BY MOUTH EVERY DAY AFTER BREAKFAST  90 capsule  0   Review Of Systems: Per HPI with the following additions: none Otherwise 12 point review of systems was performed and was unremarkable.  Physical Exam: Pulse: 67  Blood Pressure: 110/60 RR: 16   Temp: 98.6  General: alert, cooperative and appears stated age in no acute distress, but does appear to be pale and looks like he doesn't feel well.  Non-toxic appearing. HEENT: PERRLA, extra ocular movement intact, sclera clear, anicteric and oropharynx clear, no lesions Heart: 2/6 SEM is heard at 2nd left intercostal space Lungs: Prolonged expiration and wheezing  noted bibasilar Abdomen: abdomen is soft without significant tenderness, masses, organomegaly or guarding Extremities: Left great toe with 3 cm in diameter ulcer plantar aspect of toe.  Necrotic tissue noted.  Foul odor.  Culture obtained, able to probe to bone.  Overlying skin erythema  to 1st MTP joint.  BL LE edema +2 with chronic skin changes noted Skin:no rashes Neurology: normal without focal findings, mental status, speech normal, alert and oriented x3, PERLA, muscle tone and strength normal and symmetric and reflexes normal and symmetric.  Decreased sensation BL toes.    Labs and Imaging: Lab Results  Component Value Date/Time   NA 136 11/30/2010  4:17 AM   K 4.9 11/30/2010  4:17 AM   CL 102 11/30/2010  4:17 AM   CO2 26 11/30/2010  4:17 AM   BUN 25* 11/30/2010  4:17 AM   CREATININE 1.14 11/30/2010  4:17 AM   CREATININE 1.06 11/01/2010 10:20 AM   GLUCOSE 252* 11/30/2010  4:17 AM   Lab Results  Component Value Date   WBC 12.6* 12/04/2010   HGB 14.4 12/04/2010   HCT 43.9 12/04/2010   MCV 83.8 12/04/2010   PLT 196 12/04/2010    Assessment and Plan: [name] is a 70 y.o. year old male presenting with Left great toe ulcer, concern for osteomyelitis and cellulitis: 1. Osteomyelitis:  Able to probe wound to bone.  Culture obtained in clinic.  Will admit patient to telemetry bed as he has extensive cardiac history.  He will need IV broad-spectrum antibiotics and MRI of that foot.  No clear systemic symptoms yet.   2.  Pain:  Continue Tylenol #3 for pain relief.  Obtain BMET and CBC. 3.  Diastolic CHF:  Continue home regimen.  Note that patient is NOT taking his Aldactone.  I would hold this in house as he has trouble with hypotension.  I would also keep an eye out for hypotension; in past we have had Bisoprolol decreased to 2.5 mg rather than 5, but patient states he's been taking 5 mg at home.  He also is on a heart failure action plan, taking lasix as needed.  Watch creatinine. 4.  Known CAD:   Continue ASA.   5. BPH:  Patient is on Tamsulosin, but states he has not taken this in at least a month.  Would hold while in house to prevent hypotension.  Consider restarting prior to DC with instructions.   6.  DM2:  Last A1C was in July and >9.  Likely cause of chronic wound.  Recheck A1C. 7.  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  Complicates healing of wound.  Follows with [facility] here in [place], known patient.  May need ABI's to eval for ortho versus vascular surgery if toe/foot need to be removed.   8.  COPD:  Continue Spriva in house.  Currently controlled per patient symptom report. 9. FEN/GI: NPO until MRI obtained and ascertain need for surgery 10. Prophylaxis: heparin ppx 11. Disposition: pending further work up  Renold Don MD 03/13/2011 10:11 AM  I interviewed and examined this patient and discussed the care plan with Dr. Gwendolyn Grant and the [facility] team and agree with assessment and plan as documented in the admission note for today.  Wayne A. Sheffield Slider, MD Family Medicine Teaching Service Attending  03/13/2011 10:26 AM

## 2011-03-13 NOTE — Progress Notes (Signed)
  S: patient complaining of diffuse itching approximately 1 hour after starting Vancomycin, therefore RN stopped Vanc and called MD. At the time of interview, patient off of Vanc for approximately 30 minutes prior to exam. He states improving symptoms now. Denies shortness of breath, facial swelling, throat constriction, and coughing.   O: BP 95/58  Pulse 66  Temp 97.4 F (36.3 C)  Resp 20  Ht 5\' 8"  (1.727 m)  Wt 298 lb 12.8 oz (135.535 kg)  BMI 45.43 kg/m2  SpO2 100%  Gen: alert, oriented, non-distressed, morbidly obese Face: no angioedema Lungs: CTA-B Heart Sounds: distant Extremities: LLE chronic venous stasis changes, no edema, wheals or bullae  A/P: Patient with Vancomycin sensitivity; give Benadryl 50 mg IV now; restart Vancomycin infusion at slower rate  Shaquna Geigle V. Clinton Sawyer, MD [phone]

## 2011-03-14 DIAGNOSIS — L02818 Cutaneous abscess of other sites: Secondary | ICD-10-CM

## 2011-03-14 DIAGNOSIS — L03818 Cellulitis of other sites: Secondary | ICD-10-CM

## 2011-03-14 DIAGNOSIS — L98499 Non-pressure chronic ulcer of skin of other sites with unspecified severity: Secondary | ICD-10-CM

## 2011-03-14 DIAGNOSIS — I739 Peripheral vascular disease, unspecified: Secondary | ICD-10-CM

## 2011-03-14 LAB — BASIC METABOLIC PANEL
BUN: 18 mg/dL (ref 6–23)
Calcium: 9 mg/dL (ref 8.4–10.5)
Creatinine, Ser: 0.95 mg/dL (ref 0.50–1.35)
GFR calc non Af Amer: 81 mL/min — ABNORMAL LOW (ref >90)
Glucose, Bld: 168 mg/dL — ABNORMAL HIGH (ref 70–99)

## 2011-03-14 LAB — GLUCOSE, CAPILLARY: Glucose-Capillary: 168 mg/dL — ABNORMAL HIGH (ref 70–99)

## 2011-03-14 LAB — CBC
MCH: 26.3 pg (ref 26.0–34.0)
MCHC: 33 g/dL (ref 30.0–36.0)
Platelets: 182 10*3/uL (ref 150–400)

## 2011-03-14 LAB — PROTIME-INR: Prothrombin Time: 20.6 seconds — ABNORMAL HIGH (ref 11.6–15.2)

## 2011-03-14 MED ORDER — SPIRONOLACTONE 12.5 MG HALF TABLET: 12.5000 mg | ORAL_TABLET | Freq: Every day | ORAL | Status: DC

## 2011-03-14 MED ORDER — INSULIN ASPART 100 UNIT/ML ~~LOC~~ SOLN
0.0000 [IU] | Freq: Three times a day (TID) | SUBCUTANEOUS | Status: DC
  Administered 2011-03-14 (×2): 5 [IU] via SUBCUTANEOUS
  Administered 2011-03-15: 3 [IU] via SUBCUTANEOUS
  Administered 2011-03-15 (×2): 8 [IU] via SUBCUTANEOUS
  Administered 2011-03-16: 3 [IU] via SUBCUTANEOUS
  Administered 2011-03-16: 5 [IU] via SUBCUTANEOUS
  Administered 2011-03-17 – 2011-03-18 (×4): 3 [IU] via SUBCUTANEOUS
  Filled 2011-03-14 (×2): qty 3

## 2011-03-14 MED ORDER — ISOSORBIDE MONONITRATE 15 MG HALF TABLET
15.0000 mg | ORAL_TABLET | Freq: Every day | ORAL | Status: DC
  Administered 2011-03-14 – 2011-03-18 (×4): 15 mg via ORAL
  Filled 2011-03-14 (×7): qty 1

## 2011-03-14 MED ORDER — TIOTROPIUM BROMIDE MONOHYDRATE 18 MCG IN CAPS
18.0000 ug | ORAL_CAPSULE | Freq: Every day | RESPIRATORY_TRACT | Status: DC
  Administered 2011-03-14 – 2011-03-18 (×3): 18 ug via RESPIRATORY_TRACT
  Filled 2011-03-14: qty 5

## 2011-03-14 MED ORDER — VENLAFAXINE HCL ER 37.5 MG PO CP24
37.5000 mg | ORAL_CAPSULE | Freq: Every day | ORAL | Status: DC
  Administered 2011-03-14 – 2011-03-18 (×4): 37.5 mg via ORAL
  Filled 2011-03-14 (×6): qty 1

## 2011-03-14 MED ORDER — LISINOPRIL 10 MG PO TABS
10.0000 mg | ORAL_TABLET | Freq: Every day | ORAL | Status: DC
  Administered 2011-03-14 – 2011-03-18 (×4): 10 mg via ORAL
  Filled 2011-03-14 (×6): qty 1

## 2011-03-14 MED ORDER — TAMSULOSIN HCL 0.4 MG PO CAPS: 0.4000 mg | ORAL_CAPSULE | Freq: Every day | ORAL | Status: DC

## 2011-03-14 MED ORDER — INSULIN NPH (HUMAN) (ISOPHANE) 100 UNIT/ML ~~LOC~~ SUSP
20.0000 [IU] | Freq: Two times a day (BID) | SUBCUTANEOUS | Status: DC
  Administered 2011-03-14 – 2011-03-16 (×4): 20 [IU] via SUBCUTANEOUS
  Filled 2011-03-14: qty 10

## 2011-03-14 MED ORDER — NITROGLYCERIN 0.4 MG SL SUBL: 0.4000 mg | SUBLINGUAL_TABLET | SUBLINGUAL | Status: DC | PRN

## 2011-03-14 MED ORDER — ROSUVASTATIN CALCIUM 20 MG PO TABS
20.0000 mg | ORAL_TABLET | Freq: Every day | ORAL | Status: DC
  Administered 2011-03-14 – 2011-03-17 (×4): 20 mg via ORAL
  Filled 2011-03-14 (×6): qty 1

## 2011-03-14 MED ORDER — BISOPROLOL FUMARATE 5 MG PO TABS
5.0000 mg | ORAL_TABLET | Freq: Two times a day (BID) | ORAL | Status: DC
  Administered 2011-03-14 – 2011-03-18 (×8): 5 mg via ORAL
  Filled 2011-03-14 (×11): qty 1

## 2011-03-14 MED ORDER — ASPIRIN 81 MG PO CHEW
81.0000 mg | CHEWABLE_TABLET | Freq: Every day | ORAL | Status: DC
  Administered 2011-03-14 – 2011-03-18 (×4): 81 mg via ORAL
  Filled 2011-03-14 (×4): qty 1

## 2011-03-14 MED ORDER — FUROSEMIDE 40 MG PO TABS
40.0000 mg | ORAL_TABLET | Freq: Every day | ORAL | Status: DC
  Administered 2011-03-14 – 2011-03-17 (×3): 40 mg via ORAL
  Filled 2011-03-14 (×5): qty 1

## 2011-03-14 MED ORDER — INSULIN ASPART 100 UNIT/ML ~~LOC~~ SOLN
0.0000 [IU] | Freq: Every day | SUBCUTANEOUS | Status: DC
  Administered 2011-03-14 – 2011-03-16 (×3): 2 [IU] via SUBCUTANEOUS

## 2011-03-14 NOTE — Progress Notes (Signed)
FMTS Attending Note  Patient seen and examined by me; reports feeling well with baseline dyspnea.  No worsening of this, no chest pain.   L great toe with distal ulceration, I could not appreciate dp pulse on L by palpation.   MRI reading with distal phalanx osteomyelitis, not thought to extend to proximal phalanx.    Agree with continuing current abx coverage, ABI's ordered; surgical consult.  Paula Compton, M.D.

## 2011-03-14 NOTE — Consult Note (Signed)
[facility] [place]  Patient name: [name] MRN: [id number] DOB: [birth year] Sex: male  REASON FOR CONSULT: osteomyelitis of left great toe. Consult from Dr. Fara Boros  HPI: [name] is a 70 y.o. male who states that he injured his left great toe approximately 3-4 months ago. This has not been healing and he is admitted to the hospital with a infected left great toe. He had an MR I. Of the foot which reportedly showed osteomyelitis the left great toe and vascular surgery was consult. I believe his activity is fairly limited, however, he denies any history of claudication. He denies any history of rest pain. He does have diabetes.  Past Medical History  Diagnosis Date  . Depression   . Diabetes mellitus   . COPD (chronic obstructive pulmonary disease)   . Adhesive capsulitis   . MRSA bacteremia     2011 - possible endocarditis, received 6 weeks IV treatment  . Hyperlipidemia   . Anxiety   . GERD (gastroesophageal reflux disease)   . CAD (coronary artery disease)   . 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 CVA (cerebral infarction) Questionable history  . Diverticulosis   . Osteomyelitis 2012    Sternoclavicular joint   . Chronic back pain   . Angina   . Myocardial infarction   . CHF (congestive heart failure)   . Shortness of breath   . Sleep apnea   . Chronic kidney disease     hx of BPH  . Neuromuscular disorder     HX of diabetic periferal neuropathy    Family History  Problem Relation Age of Onset  . Heart disease Father     SOCIAL HISTORY: History  Substance Use Topics  . Smoking status: Former Smoker    Quit date: 06/19/2000  . Smokeless tobacco: Never Used  . Alcohol Use: No    Allergies  Allergen Reactions  . Diazepam     REACTION: unspecified    Current Facility-Administered Medications  Medication Dose Route Frequency Provider Last Rate Last Dose  . 0.9 %  sodium chloride infusion  250 mL  Intravenous PRN Renold Don 10 mL/hr at 03/13/11 1816 250 mL at 03/13/11 1816  . acetaminophen-codeine (TYLENOL #3) 300-30 MG per tablet 1 tablet  1 tablet Oral Q4H PRN Renold Don      . diphenhydrAMINE (BENADRYL) injection 50 mg  50 mg Intravenous Q6H PRN Mat Carne, MD   50 mg at 03/13/11 2058  . heparin injection 5,000 Units  5,000 Units Subcutaneous Q8H Jeff Walden   5,000 Units at 03/14/11 0609  . ondansetron (ZOFRAN) injection 4 mg  4 mg Intravenous Q8H PRN Renold Don      . piperacillin-tazobactam (ZOSYN) IVPB 3.375 g  3.375 g Intravenous Q8H James O Breen   3.375 g at 03/14/11 4098  . sodium chloride 0.9 % injection 3 mL  3 mL Intravenous Q12H Jeff Walden   3 mL at 03/13/11 2224  . sodium chloride 0.9 % injection 3 mL  3 mL Intravenous PRN Renold Don      . vancomycin (VANCOCIN) 1,750 mg in sodium chloride 0.9 % 500 mL IVPB  1,750 mg Intravenous Q24H Crystal Stillinger Robertson, PHARMD      . vancomycin (VANCOCIN) 2,000 mg in sodium chloride 0.9 % 500 mL IVPB  2,000 mg Intravenous Once Marta Lamas Breen   2,000 mg at 03/13/11 1191    REVIEW OF SYSTEMS: Arly.Keller ]  denotes positive finding; [  ] denotes negative finding CARDIOVASCULAR:  [ ]  chest pain   [ ]  chest pressure   [ ]  palpitations   [ ]  orthopnea   Arly.Keller ] dyspnea on exertion   [ ]  claudication   [ ]  rest pain   [ ]  DVT   [ ]  phlebitis PULMONARY:   [ ]  productive cough   [ ]  asthma   [ ]  wheezing NEUROLOGIC:   [ ]  weakness  [ ]  paresthesias  [ ]  aphasia  [ ]  amaurosis  [ ]  dizziness HEMATOLOGIC:   [ ]  bleeding problems   [ ]  clotting disorders MUSCULOSKELETAL:  [ ]  joint pain   [ ]  joint swelling [ ]  leg swelling GASTROINTESTINAL: [ ]   blood in stool  [ ]   hematemesis GENITOURINARY:  [ ]   dysuria  [ ]   hematuria PSYCHIATRIC:  [ ]  history of major depression INTEGUMENTARY:  [ ]  rashes  Arly.Keller ] ulcers- Left great toe CONSTITUTIONAL:  [ ]  fever   [ ]  chills  PHYSICAL EXAM: Filed Vitals:   03/13/11 1215 03/13/11 2045 03/14/11 0457    BP: 95/58 95/58 106/66  Pulse: 66 72 70  Temp: 97.4 F (36.3 C) 97.6 F (36.4 C) 97.9 F (36.6 C)  TempSrc:  Oral Oral  Resp: 20 20 22   Height: 5\' 8"  (1.727 m)    Weight: 298 lb 12.8 oz (135.535 kg)  294 lb 4.8 oz (133.494 kg)  SpO2: 100% 94% 93%   Body mass index is 44.75 kg/(m^2). GENERAL: The patient is a well-nourished male, in no acute distress. The vital signs are documented above. CARDIOVASCULAR: There is a regular rate and rhythm without significant murmur appreciated. I do not detect any carotid bruits. I cannot palpate femoral or popliteal pulses. It is difficult to examine his pulses because of his size. He does have a palpable right dorsalis pedis pulse. I cannot palpate pedal pulses on the left side. He has hyperpigmentation bilaterally consistent with chronic venous insufficiency. PULMONARY: There is good air exchange bilaterally without wheezing or rales. ABDOMEN: Soft and non-tender with normal pitched bowel sounds.  MUSCULOSKELETAL: There are no major deformities or cyanosis. NEUROLOGIC: No focal weakness or paresthesias are detected. SKIN: he has chronic venous stasis changes bilaterally. He has a swollen wound of the left great toe with exposed bone and a foul odor. PSYCHIATRIC: The patient has a normal affect.  DATA:  Lab Results  Component Value Date   WBC 9.9 03/14/2011   HGB 13.2 03/14/2011   HCT 40.0 03/14/2011   MCV 79.7 03/14/2011   PLT 182 03/14/2011   Lab Results  Component Value Date   NA 134* 03/14/2011   K 4.2 03/14/2011   CL 102 03/14/2011   CO2 24 03/14/2011   Lab Results  Component Value Date   CREATININE 0.95 03/14/2011   Lab Results  Component Value Date   INR 1.73* 03/14/2011   INR 1.60* 03/13/2011   INR 2.8 02/18/2011   Lab Results  Component Value Date   HGBA1C 9.4* 10/18/2010   CBG (last 3)   Basename 03/14/11 0614 03/13/11 2042 03/13/11 1622  GLUCAP 168* 298* 208*   MRI LEFT FOOT: The left great toe terminal phalanx  has osteomyelitis with overlying ulceration. There is no clear evidence of osteomyelitis in the proximal phalanx of the left great toe. No soft tissue abscess is noted. There is diffuse edema of the forefoot and cellulitis.  Arterial Doppler study: This result is  pending.  MEDICAL ISSUES: 1. This is clearly a limb threatening situation of the left lower extremity. It is difficult to assess his pulses because of his size. Arterial Doppler study is pending. I will follow up his arterial Doppler study. He may require arteriography in order to further assess his peripheral vascular disease. I'm not sure that he has adequate circulation to heal a toe amputation. As he is currently not septic, I do not think he needs urgent amputation of the toe. 2. He is not a good candidate for bypass given his multiple medical comorbidities, obesity, and stasis dermatitis which would significantly increase his risk of wound healing problems. 3. The patient is on Zosyn and vancomycin IV.  DICKSON,CHRISTOPHER S [facility]  [place]: [phone]

## 2011-03-14 NOTE — Progress Notes (Signed)
Inpatient Diabetes Program Recommendations  AACE/ADA: New Consensus Statement on Inpatient Glycemic Control (2009)  Target Ranges:  Prepandial:   less than 140 mg/dL      Peak postprandial:   less than 180 mg/dL (1-2 hours)      Critically ill patients:  140 - 180 mg/dL   Reason for Visit: Hyperglycemia  Inpatient Diabetes Program Recommendations Insulin - Basal: Add basal insulin - Pt on NPH N 70 units bid at home  Note: CBGs 298, 168, 229

## 2011-03-14 NOTE — Progress Notes (Signed)
*  PRELIMINARY RESULTS*  ABI's has been performed.  ABI's indicates normal arterial blood flow on the right and indicates a moderate reduction in arterial blood flow on the left.   Right - 0.99 Left - 0.74  [name] 03/14/2011, 10:07 AM

## 2011-03-14 NOTE — Progress Notes (Signed)
Subjective: Patient feels well.  Still having some pain/discomfort but overall improved since admission.  Vascular Surgery has been by to see him already.  Has had MRI and ABIs done. No N/V, no fevers.   Objective: Vital signs in last 24 hours: Temp:  [97.4 F (36.3 C)-97.9 F (36.6 C)] 97.9 F (36.6 C) (12/21 0457) Pulse Rate:  [66-72] 70  (12/21 0457) Resp:  [20-22] 22  (12/21 0457) BP: (95-106)/(58-66) 106/66 mmHg (12/21 0457) SpO2:  [93 %-100 %] 93 % (12/21 0457) Weight:  [294 lb 4.8 oz (133.494 kg)-298 lb 12.8 oz (135.535 kg)] 294 lb 4.8 oz (133.494 kg) (12/21 0457) Weight change:  Last BM Date: 03/12/11  Intake/Output from previous day: 12/20 0701 - 12/21 0700 In: 1313 [P.O.:760; I.V.:3; IV Piggyback:550] Out: 1000 [Urine:1000] Intake/Output this shift: Total I/O In: 480 [P.O.:480] Out: 250 [Urine:250]  PHYSICAL EXAM General: alert, cooperative and appears stated age in no acute distress, non-toxic, talkative. Heart: 2/6 SEM is heard at 2nd left intercostal space, regular rate and rythym   Lungs: Prolonged expiration and wheezing noted bibasilar  Abdomen: abdomen is soft without significant tenderness, masses, organomegaly or guarding  Extremities: Left great toe with 3 cm in diameter ulcer plantar aspect of toe. Necrotic tissue noted. Foul odor. Overlying skin erythema to 1st MTP joint. BL LE edema +2 with chronic skin changes noted   Lab Results:  [facility] 03/14/11 0542 03/13/11 1503  WBC 9.9 12.0*  HGB 13.2 12.5*  HCT 40.0 37.9*  PLT 182 195   BMET  Basename 03/14/11 0542 03/13/11 1503  NA 134* 132*  K 4.2 4.1  CL 102 98  CO2 24 25  GLUCOSE 168* 208*  BUN 18 22  CREATININE 0.95 1.13  CALCIUM 9.0 8.7    Studies/Results: No results found.  Medications:  I have reviewed the patient's current medications. Scheduled:   . heparin  5,000 Units Subcutaneous Q8H  . piperacillin-tazobactam (ZOSYN)  IV  3.375 g Intravenous Q8H  . sodium chloride  3 mL  Intravenous Q12H  . vancomycin  1,750 mg Intravenous Q24H  . vancomycin  2,000 mg Intravenous Once    Assessment/Plan: [name] is a 70 y.o. year old male presenting with Left great toe ulcer, concern for osteomyelitis and cellulitis:   1. Osteomyelitis: Able to probe wound to bone. Culture obtained in clinic now growing few GPC in pairs and GNR.  -MRI and ABIs done, results pending -Vascular surgery, Dr. Durwin Nora, consulted, greatly appreciate their assistance and will follow up recommendations -Continue broad spectrum abx (van/zosyn)-- pt remains afebrile without systemic symptoms -CBC in AM  2. Pain: Continue Tylenol #3 for pain relief.   3. Diastolic CHF: Continue home regimen (lasix, bisoprolol, imdur, lisinopril). Note that patient is NOT taking his Aldactone, so will hold this in house as he has trouble with hypotension.  -Continue on telemetry and monitor closely for hypotension; in past we have had Bisoprolol decreased to 2.5 mg rather than 5, but patient states he's been taking 5 mg at home. He also is on a heart failure action plan, taking lasix as needed.  -BMET in AM to monitor Cr and lytes  4. Known CAD: Continue ASA and statin.   5. BPH: Patient is on Tamsulosin, but states he has not taken this in at least a month. Will hold while in house to prevent hypotension. Consider restarting prior to DC with instructions.   6. DM2: Last A1C was in July and >9. Likely cause of chronic  wound.  -Recheck A1C.  -SSI for inhouse CBG control  7. 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 Complicates healing of wound. Follows with [facility] here in [place], known patient.  -ABIs pending  8. COPD: Continue Spriva in house. Currently controlled per patient symptom report. Oxygenating well on room air  9. H/o blood clot: Pt on warfarin at home, INR currently 1.7 -Hold home warfarin and continue heparin ppx as pt will likely need surgery -No need for Vit K at this time,  unless vascular surgery plans on OR today and would like INR lower  10. FEN/GI: diabetic diet, will make NPO if needed per vascular surgery recs  11. Prophylaxis: heparin ppx   12. Disposition: pending further work up, will likely need at minimum surgical debridement of left great toe      LOS: 1 day   Kalany Diekmann 03/14/2011, 9:23 AM

## 2011-03-15 ENCOUNTER — Inpatient Hospital Stay (HOSPITAL_COMMUNITY): Payer: Medicare HMO

## 2011-03-15 ENCOUNTER — Encounter (HOSPITAL_COMMUNITY): Payer: Self-pay | Admitting: Radiology

## 2011-03-15 LAB — GLUCOSE, CAPILLARY: Glucose-Capillary: 181 mg/dL — ABNORMAL HIGH (ref 70–99)

## 2011-03-15 LAB — CBC
HCT: 37.3 % — ABNORMAL LOW (ref 39.0–52.0)
MCHC: 32.7 g/dL (ref 30.0–36.0)
MCV: 79.9 fL (ref 78.0–100.0)
RDW: 17.1 % — ABNORMAL HIGH (ref 11.5–15.5)

## 2011-03-15 LAB — HEMOGLOBIN A1C: Mean Plasma Glucose: 237 mg/dL — ABNORMAL HIGH (ref <117)

## 2011-03-15 LAB — BASIC METABOLIC PANEL
BUN: 16 mg/dL (ref 6–23)
Creatinine, Ser: 1.04 mg/dL (ref 0.50–1.35)
GFR calc Af Amer: 81 mL/min — ABNORMAL LOW (ref >90)
GFR calc non Af Amer: 70 mL/min — ABNORMAL LOW (ref >90)

## 2011-03-15 MED ORDER — IOHEXOL 350 MG/ML SOLN
120.0000 mL | Freq: Once | INTRAVENOUS | Status: AC | PRN
  Administered 2011-03-15: 120 mL via INTRAVENOUS

## 2011-03-15 MED ORDER — DIPHENHYDRAMINE HCL 50 MG/ML IJ SOLN
25.0000 mg | Freq: Once | INTRAMUSCULAR | Status: AC
  Administered 2011-03-15: 25 mg via INTRAVENOUS
  Filled 2011-03-15: qty 1

## 2011-03-15 MED ORDER — VANCOMYCIN HCL 1000 MG IV SOLR
1500.0000 mg | Freq: Two times a day (BID) | INTRAVENOUS | Status: DC
  Administered 2011-03-15 – 2011-03-18 (×6): 1500 mg via INTRAVENOUS
  Filled 2011-03-15 (×7): qty 1500

## 2011-03-15 NOTE — Progress Notes (Signed)
Subjective: Afebrile overnight. No acute issues.   Objective: Vital signs in last 24 hours: Temp:  [97.6 F (36.4 C)-98.3 F (36.8 C)] 97.7 F (36.5 C) (12/22 0443) Pulse Rate:  [63-105] 63  (12/22 0443) Resp:  [20] 20  (12/22 0443) BP: (102-119)/(66-74) 119/74 mmHg (12/22 0443) SpO2:  [91 %-94 %] 93 % (12/22 0443) Weight:  [296 lb 4.8 oz (134.4 kg)] 296 lb 4.8 oz (134.4 kg) (12/22 0443) Weight change: -2 lb 8 oz (-1.135 kg) Last BM Date: 03/14/11  Intake/Output from previous day: 12/21 0701 - 12/22 0700 In: 1300 [P.O.:1300] Out: 1850 [Urine:1850] Intake/Output this shift: Total I/O In: 240 [P.O.:240] Out: -   Physical Exam:  General: alert, cooperative and appears stated age in no acute distress, non-toxic, talkative.  Heart: 2/6 SEM is heard at 2nd left intercostal space, regular rate and rythym  Lungs: Prolonged expiration and wheezing noted bibasilar  Abdomen: abdomen is soft without significant tenderness, masses, organomegaly or guarding  Extremities: Left great toe with 3 cm in diameter ulcer plantar aspect of toe. Necrotic tissue noted. Foul odor. Overlying skin erythema to 1st MTP joint. BL LE edema +2 with chronic skin changes noted  Lab Results:  Basename 03/15/11 0700 03/14/11 0542  WBC 9.6 9.9  HGB 12.2* 13.2  HCT 37.3* 40.0  PLT 198 182   BMET  Basename 03/15/11 0700 03/14/11 0542  NA 135 134*  K 4.2 4.2  CL 102 102  CO2 25 24  GLUCOSE 197* 168*  BUN 16 18  CREATININE 1.04 0.95  CALCIUM 8.9 9.0    Studies/Results: Mr Foot Left Wo Contrast  03/14/2011  *RADIOLOGY REPORT*  Clinical Data: Great toe osteomyelitis.  Foot pain.  MRI OF THE LEFT FOREFOOT WITHOUT CONTRAST  Technique:  Multiplanar, multisequence MR imaging was performed. No intravenous contrast was administered.  Comparison: 06/09/2009.  Findings: Ulceration is present along the medial plantar aspect of the terminal phalanx of the great toe, near the great toe IP joint. There is  osteomyelitis of the great toe with cortical erosion of the medial surface of the base of proximal phalanx.  No discrete soft tissue abscess is identified.  Bone marrow edema radiates through the terminal phalanx of the great toe.  No convincing evidence of septic arthritis of the great toe IP joint.  Reactive bone marrow edema is present in the proximal phalanx of the great toe.  Diffuse cellulitis/edema of the foot, more prominent dorsally and along the plantar aspect.  Diabetic denervation atrophy of the foot. First MTP joint osteoarthritis.  Sparse trabeculation of the foot, compatible with osteopenia.  IMPRESSION: 1.  Great toe terminal phalanx osteomyelitis with overlying ulceration. 2.  Reactive bone marrow edema without convincing evidence of osteomyelitis in the proximal phalanx of the great toe. 3.  No soft tissue abscess.  Diffuse edema of the forefoot compatible with cellulitis.  Original Report Authenticated By: Andreas Newport, M.D.    Medications:  Scheduled:   . aspirin  81 mg Oral Daily  . bisoprolol  5 mg Oral BID  . furosemide  40 mg Oral Daily  . heparin  5,000 Units Subcutaneous Q8H  . insulin aspart  0-15 Units Subcutaneous TID WC  . insulin aspart  0-5 Units Subcutaneous QHS  . insulin NPH  20 Units Subcutaneous BID  . isosorbide mononitrate  15 mg Oral Daily  . lisinopril  10 mg Oral Daily  . piperacillin-tazobactam (ZOSYN)  IV  3.375 g Intravenous Q8H  . rosuvastatin  20 mg  Oral QHS  . sodium chloride  3 mL Intravenous Q12H  . tiotropium  18 mcg Inhalation Daily  . vancomycin  1,750 mg Intravenous Q24H  . venlafaxine  37.5 mg Oral Daily  . DISCONTD: spironolactone  12.5 mg Oral Daily  . DISCONTD: Tamsulosin HCl  0.4 mg Oral QPC breakfast   Continuous:   Assessment/Plan: [name] is a 70 y.o. year old male presenting with Left great toe ulcer, concern for osteomyelitis and cellulitis:  1. Osteomyelitis:  -MRI indicative of osteomyelitis of L great toe -Pt  is a known vasculopath  - Vascular Surgery formally consulted.  -Plan discussed with Dr. Edilia Bo via phone ; plan for CT angiogram of abdomen today and great toe amputation tomorrow.  -Continue broad spectrum abx (van/zosyn) -INR trending down to likely goal of  <1.5 in anticipation of OR -NPO PMN 2. Pain: Continue Tylenol #3 for pain relief.  3. Diastolic CHF: Continue home regimen. Holding aldosterone antagonist in setting of hypotension. Fluid neutral since admission.  4. Known CAD: Continue ASA and statin. 5. BPH: holding flomax 2/2 hypotension. Will consider restarting vs. Foley if pt develops urinary retention post operatively.  6. DM2: History of poor control, A1C pending  -SSI for inhouse CBG control  7. COPD: Continue Spriva in house. Currently controlled per patient symptom report. Oxygenating well on room air  8. H/o blood clot: holding coumadin pending OR; likely goal INR <1.5. Will continue sub q heparin.  9. FEN/GI: diabetic diet, NPO PMN 10. Prophylaxis: heparin ppx  11. Disposition: pending OR  LOS: 2 days   [name] 03/15/2011, 9:46 AM

## 2011-03-15 NOTE — Progress Notes (Signed)
FMTS Attending Note  Case discussed with resident Dr. Alvester Morin, and I agree with his assessment and plan.  Patient is to continue with vanc/zosyn abx regimen today, for OR tomorrow for L great toe amputation. Paula Compton, M.D.

## 2011-03-15 NOTE — Progress Notes (Signed)
VASCULAR PROGRESS NOTE  SUBJECTIVE: No specific complaints this morning.   PHYSICAL EXAM: Filed Vitals:   03/14/11 1141 03/14/11 1420 03/14/11 2136 03/15/11 0443  BP:  102/66 104/67 119/74  Pulse:  67 65 63  Temp:  98.3 F (36.8 C) 97.6 F (36.4 C) 97.7 F (36.5 C)  TempSrc:  Oral    Resp:  20 20 20   Height:      Weight:    296 lb 4.8 oz (134.4 kg)  SpO2: 93% 94% 91% 93%   Lungs: clear to auscultation This morning I am able to palpate a left popliteal pulse. He tells me that she's had a previous bypass graft on both sides. I had a difficult time palpating femoral pulses because of his size. He has gangrene of the left great toe with exposed bone. There is mild cellulitis and some minimal drainage.  LABS: Lab Results  Component Value Date   WBC 9.6 03/15/2011   HGB 12.2* 03/15/2011   HCT 37.3* 03/15/2011   MCV 79.9 03/15/2011   PLT 198 03/15/2011   Lab Results  Component Value Date   CREATININE 1.04 03/15/2011   Lab Results  Component Value Date   INR 1.58* 03/15/2011   CBG (last 3)   Basename 03/15/11 0612 03/14/11 2134 03/14/11 1616  GLUCAP 181* 210* 226*   ABI's: I reviewed his arterial Doppler study which shows an ABI of 99% on the right and 74% on the left.  ASSESSMENT/PLAN: 1. 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 He has evidence of tibial artery occlusive disease based on his Doppler study and physical examination. I am able to palpate a popliteal pulse on the left which suggest that his bypass graft on the left is patent. I am unable to find any records on the computer system of weight and where his bypass grafts were performed. I do think that he will need a amputation of the left great toe which will likely have to be left open because of infection. I'm concerned that he may not have adequate circulation to heal this. 2. I have ordered a CT angiogram of the aorta with bilateral runoff today to further assess his circulation.  Given that he has bilateral femoropopliteal bypass grafts I would prefer not to obtain an arteriogram unless the CT scan does not provide adequate information. 3. I have scheduled amputation of the left great toe for tomorrow a.m. He was on Coumadin prior to admission and this is being held. 4. He is on IV vancomycin and Zosyn which will continue.  Waverly Ferrari, MD, FACS Beeper: [phone] 03/15/2011

## 2011-03-15 NOTE — Progress Notes (Addendum)
ANTICOAGULATION / ANTIBIOTIC CONSULT NOTE - Follow Up  Pharmacy Consult:  Coumadin / Vanc / Zosyn Indication:  H/o PE/?CVA / osteomyelitis  Allergies  Allergen Reactions  . Diazepam     REACTION: unspecified   Vital Signs: Temp: 97.7 F (36.5 C) (12/22 0443) BP: 119/74 mmHg (12/22 0443) Pulse Rate: 63  (12/22 0443)  Labs:  Basename 03/15/11 0700 03/14/11 0542 03/13/11 1503  WBC 9.6 9.9 12.0*  HGB 12.2* 13.2 12.5*  PLT 198 182 195  LABCREA -- -- --  CREATININE 1.04 0.95 1.13   Estimated Creatinine Clearance: 86.1 ml/min (by C-G formula based on Cr of 1.04). No results found for this basename: VANCOTROUGH:2,VANCOPEAK:2,VANCORANDOM:2,GENTTROUGH:2,GENTPEAK:2,GENTRANDOM:2,TOBRATROUGH:2,TOBRAPEAK:2,TOBRARND:2,AMIKACINPEAK:2,AMIKACINTROU:2,AMIKACIN:2, in the last 72 hours   Microbiology: Recent Results (from the past 720 hour(s))  MRSA PCR SCREENING     Status: Normal   Collection Time   03/13/11 12:35 PM      Component Value Range Status Comment   MRSA by PCR NEGATIVE  NEGATIVE  Final   WOUND CULTURE     Status: Normal (Preliminary result)   Collection Time   03/13/11  2:20 PM      Component Value Range Status Comment   GRAM STAIN No WBC Seen   Preliminary    GRAM STAIN No Squamous Epithelial Cells Seen   Preliminary    GRAM STAIN Few Gram Positive Cocci In Pairs   Preliminary    GRAM STAIN Few Gram Negative Rods   Preliminary   CULTURE, BLOOD (ROUTINE X 2)     Status: Normal (Preliminary result)   Collection Time   03/13/11  2:38 PM      Component Value Range Status Comment   Specimen Description BLOOD RIGHT ARM   Final    Special Requests BOTTLES DRAWN AEROBIC AND ANAEROBIC 10CC   Final    Setup Time [id number]   Final    Culture     Final    Value:        BLOOD CULTURE RECEIVED NO GROWTH TO DATE CULTURE WILL BE HELD FOR 5 DAYS BEFORE ISSUING A FINAL NEGATIVE REPORT   Report Status PENDING   Incomplete   CULTURE, BLOOD (ROUTINE X 2)     Status: Normal (Preliminary  result)   Collection Time   03/13/11  2:48 PM      Component Value Range Status Comment   Specimen Description BLOOD RIGHT HAND   Final    Special Requests BOTTLES DRAWN AEROBIC AND ANAEROBIC 5CC   Final    Setup Time [id number]   Final    Culture     Final    Value:        BLOOD CULTURE RECEIVED NO GROWTH TO DATE CULTURE WILL BE HELD FOR 5 DAYS BEFORE ISSUING A FINAL NEGATIVE REPORT   Report Status PENDING   Incomplete     Medical History: Past Medical History  Diagnosis Date  . Depression   . Diabetes mellitus   . COPD (chronic obstructive pulmonary disease)   . Adhesive capsulitis   . MRSA bacteremia     2011 - possible endocarditis, received 6 weeks IV treatment  . Hyperlipidemia   . Anxiety   . GERD (gastroesophageal reflux disease)   . CAD (coronary artery disease)   . 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 CVA (cerebral infarction) Questionable history  . Diverticulosis   . Osteomyelitis 2012    Sternoclavicular joint   . Chronic back pain   . Angina   .  Myocardial infarction   . CHF (congestive heart failure)   . Shortness of breath   . Sleep apnea   . Chronic kidney disease     hx of BPH  . Neuromuscular disorder     HX of diabetic periferal neuropathy    Assessment:  ANTICOAG: hx PE/?CVA on heparin SQ while Coumadin on hold for toe amputation tomorrow, INR 1.58  ID: Vanc/Zosyn D#3 for L great toe dry gangrene and osteo, afebrile, WBC WNL, bld cx NGTD, noted vanc sensitivity, MRI--great toe terminal phalanx osteo with ulceration, no abscess, forefoot edema with cellulitis  NEURO: Anxiety, depression - on venlafaxine PULM: OSA, COPD - 93% RA - on Spiriva CARDS: Lipidemia, HTN, CAD, CHF - VSS - on ASA, bisoprolol, Lasix, Imdur, lisinopril, Crestor, spironolactone GI: GERD - not on med, on carb modified diet ENDO: DM, diabetic neuropathy - CBGs 181-229 on SSI + NPH 20 units BID (PTA 70units BID) RENAL: BPH on Flomax, SCr 1.04,  CrCL 86 ml/min, lytes WNL HEMONC: H/H/plts stable Best Practices: Heparin SQ, home meds addressed  Plan:  1. Change vancomycin 1500mg  IV Q12H for goal trough 15-20 mcg/mL 2. Continue Zosyn 3.375g IV q8h 3. Monitor renal fxn, trough next week 4. Continue to hold Coumadin and f/u with order to resume post surgery.   Phillips Climes Dien 03/15/2011,10:58 AM

## 2011-03-16 ENCOUNTER — Encounter (HOSPITAL_COMMUNITY): Payer: Self-pay | Admitting: Anesthesiology

## 2011-03-16 ENCOUNTER — Inpatient Hospital Stay (HOSPITAL_COMMUNITY): Payer: Medicare HMO | Admitting: Anesthesiology

## 2011-03-16 ENCOUNTER — Encounter (HOSPITAL_COMMUNITY)
Admission: AD | Disposition: A | Discharge status: Home / Self Care | Payer: Self-pay | Source: Ambulatory Visit | Attending: Family Medicine

## 2011-03-16 ENCOUNTER — Inpatient Hospital Stay (HOSPITAL_COMMUNITY): Payer: Medicare HMO

## 2011-03-16 ENCOUNTER — Other Ambulatory Visit: Payer: Self-pay | Admitting: Vascular Surgery

## 2011-03-16 DIAGNOSIS — I70269 Atherosclerosis of native arteries of extremities with gangrene, unspecified extremity: Secondary | ICD-10-CM

## 2011-03-16 HISTORY — PX: AMPUTATION: SHX166

## 2011-03-16 LAB — GLUCOSE, CAPILLARY
Glucose-Capillary: 157 mg/dL — ABNORMAL HIGH (ref 70–99)
Glucose-Capillary: 171 mg/dL — ABNORMAL HIGH (ref 70–99)
Glucose-Capillary: 164 mg/dL — ABNORMAL HIGH (ref 70–99)
Glucose-Capillary: 221 mg/dL — ABNORMAL HIGH (ref 70–99)
Glucose-Capillary: 203 mg/dL — ABNORMAL HIGH (ref 70–99)

## 2011-03-16 LAB — BASIC METABOLIC PANEL
BUN: 14 mg/dL (ref 6–23)
Creatinine, Ser: 1.09 mg/dL (ref 0.50–1.35)
GFR calc Af Amer: 76 mL/min — ABNORMAL LOW (ref >90)
GFR calc non Af Amer: 66 mL/min — ABNORMAL LOW (ref >90)
Potassium: 4.6 mEq/L (ref 3.5–5.1)

## 2011-03-16 LAB — SURGICAL PCR SCREEN: Staphylococcus aureus: POSITIVE — AB

## 2011-03-16 LAB — DIFFERENTIAL
Basophils Relative: 0 % (ref 0–1)
Eosinophils Absolute: 0.4 10*3/uL (ref 0.0–0.7)
Monocytes Absolute: 0.8 10*3/uL (ref 0.1–1.0)
Monocytes Relative: 7 % (ref 3–12)
Neutrophils Relative %: 70 % (ref 43–77)

## 2011-03-16 LAB — CBC
Hemoglobin: 12.9 g/dL — ABNORMAL LOW (ref 13.0–17.0)
MCH: 26.3 pg (ref 26.0–34.0)
MCHC: 32.9 g/dL (ref 30.0–36.0)

## 2011-03-16 SURGERY — AMPUTATION DIGIT
Anesthesia: General | Site: Toe | Laterality: Left | Wound class: Dirty or Infected

## 2011-03-16 MED ORDER — BACITRACIN ZINC 500 UNIT/GM EX OINT
TOPICAL_OINTMENT | CUTANEOUS | Status: DC | PRN
  Administered 2011-03-16: 1 via TOPICAL

## 2011-03-16 MED ORDER — MORPHINE SULFATE 4 MG/ML IJ SOLN
4.0000 mg | INTRAMUSCULAR | Status: DC | PRN
  Administered 2011-03-16 – 2011-03-17 (×4): 4 mg via INTRAVENOUS
  Filled 2011-03-16 (×4): qty 1

## 2011-03-16 MED ORDER — LACTATED RINGERS IV SOLN
INTRAVENOUS | Status: DC | PRN
  Administered 2011-03-16: 07:00:00 via INTRAVENOUS

## 2011-03-16 MED ORDER — FENTANYL CITRATE 0.05 MG/ML IJ SOLN
INTRAMUSCULAR | Status: DC | PRN
  Administered 2011-03-16: 100 ug via INTRAVENOUS

## 2011-03-16 MED ORDER — PROMETHAZINE HCL 25 MG/ML IJ SOLN: 6.2500 mg | INTRAMUSCULAR | Status: DC | PRN

## 2011-03-16 MED ORDER — SUCCINYLCHOLINE CHLORIDE 20 MG/ML IJ SOLN
INTRAMUSCULAR | Status: DC | PRN
  Administered 2011-03-16: 160 mg via INTRAVENOUS

## 2011-03-16 MED ORDER — ETOMIDATE 2 MG/ML IV SOLN
INTRAVENOUS | Status: DC | PRN
  Administered 2011-03-16: 20 mg via INTRAVENOUS

## 2011-03-16 MED ORDER — ONDANSETRON HCL 4 MG/2ML IJ SOLN
INTRAMUSCULAR | Status: DC | PRN
  Administered 2011-03-16: 4 mg via INTRAVENOUS

## 2011-03-16 MED ORDER — OXYCODONE-ACETAMINOPHEN 5-325 MG PO TABS
1.0000 | ORAL_TABLET | ORAL | Status: DC | PRN
  Administered 2011-03-16: 2 via ORAL
  Filled 2011-03-16: qty 2

## 2011-03-16 MED ORDER — WARFARIN SODIUM 7.5 MG PO TABS
7.5000 mg | ORAL_TABLET | Freq: Once | ORAL | Status: DC
  Filled 2011-03-16: qty 1

## 2011-03-16 MED ORDER — MUPIROCIN 2 % EX OINT
1.0000 "application " | TOPICAL_OINTMENT | Freq: Two times a day (BID) | CUTANEOUS | Status: DC
  Administered 2011-03-16 – 2011-03-18 (×4): 1 via NASAL
  Filled 2011-03-16: qty 22

## 2011-03-16 MED ORDER — PROPOFOL 10 MG/ML IV EMUL
INTRAVENOUS | Status: DC | PRN
  Administered 2011-03-16: 30 mg via INTRAVENOUS

## 2011-03-16 MED ORDER — 0.9 % SODIUM CHLORIDE (POUR BTL) OPTIME
TOPICAL | Status: DC | PRN
  Administered 2011-03-16: 1000 mL

## 2011-03-16 MED ORDER — LACTATED RINGERS IV SOLN: INTRAVENOUS | Status: DC

## 2011-03-16 MED ORDER — CHLORHEXIDINE GLUCONATE CLOTH 2 % EX PADS
6.0000 | MEDICATED_PAD | Freq: Every day | CUTANEOUS | Status: DC
  Administered 2011-03-17: 6 via TOPICAL

## 2011-03-16 MED ORDER — HYDROMORPHONE HCL PF 1 MG/ML IJ SOLN
0.2500 mg | INTRAMUSCULAR | Status: DC | PRN
  Administered 2011-03-16 (×2): 0.5 mg via INTRAVENOUS

## 2011-03-16 SURGICAL SUPPLY — 37 items
BANDAGE ACE 4 STERILE (GAUZE/BANDAGES/DRESSINGS) ×2 IMPLANT
BANDAGE CONFORM 3  STR LF (GAUZE/BANDAGES/DRESSINGS) IMPLANT
BANDAGE ELASTIC 4 VELCRO ST LF (GAUZE/BANDAGES/DRESSINGS) ×2 IMPLANT
BANDAGE GAUZE ELAST BULKY 4 IN (GAUZE/BANDAGES/DRESSINGS) ×2 IMPLANT
BLADE LONG MED 31X9 (MISCELLANEOUS) ×2 IMPLANT
CANISTER SUCTION 2500CC (MISCELLANEOUS) ×2 IMPLANT
CLOTH BEACON ORANGE TIMEOUT ST (SAFETY) ×2 IMPLANT
COVER SURGICAL LIGHT HANDLE (MISCELLANEOUS) ×4 IMPLANT
DRAPE EXTREMITY T 121X128X90 (DRAPE) IMPLANT
DRAPE ORTHO SPLIT 77X108 STRL (DRAPES) ×1
DRAPE SURG ORHT 6 SPLT 77X108 (DRAPES) ×1 IMPLANT
DRSG EMULSION OIL 3X3 NADH (GAUZE/BANDAGES/DRESSINGS) ×2 IMPLANT
ELECT REM PT RETURN 9FT ADLT (ELECTROSURGICAL) ×2
ELECTRODE REM PT RTRN 9FT ADLT (ELECTROSURGICAL) ×1 IMPLANT
GAUZE SPONGE 4X4 12PLY STRL LF (GAUZE/BANDAGES/DRESSINGS) ×2 IMPLANT
GLOVE BIO SURGEON STRL SZ7.5 (GLOVE) ×2 IMPLANT
GLOVE BIOGEL PI IND STRL 7.5 (GLOVE) ×1 IMPLANT
GLOVE BIOGEL PI INDICATOR 7.5 (GLOVE) ×1
GOWN PREVENTION PLUS XLARGE (GOWN DISPOSABLE) ×2 IMPLANT
GOWN STRL NON-REIN LRG LVL3 (GOWN DISPOSABLE) ×2 IMPLANT
KIT BASIN OR (CUSTOM PROCEDURE TRAY) ×2 IMPLANT
KIT ROOM TURNOVER OR (KITS) ×2 IMPLANT
NS IRRIG 1000ML POUR BTL (IV SOLUTION) ×2 IMPLANT
PACK CV ACCESS (CUSTOM PROCEDURE TRAY) ×2 IMPLANT
PACK GENERAL/GYN (CUSTOM PROCEDURE TRAY) IMPLANT
PAD ARMBOARD 7.5X6 YLW CONV (MISCELLANEOUS) ×4 IMPLANT
SPECIMEN JAR SMALL (MISCELLANEOUS) ×2 IMPLANT
SPONGE GAUZE 4X4 12PLY (GAUZE/BANDAGES/DRESSINGS) ×2 IMPLANT
SUT ETHILON 3 0 PS 1 (SUTURE) ×4 IMPLANT
SUT VIC AB 3-0 SH 27 (SUTURE) ×1
SUT VIC AB 3-0 SH 27XBRD (SUTURE) ×1 IMPLANT
SWAB COLLECTION DEVICE MRSA (MISCELLANEOUS) ×2 IMPLANT
TOWEL OR 17X24 6PK STRL BLUE (TOWEL DISPOSABLE) ×2 IMPLANT
TOWEL OR 17X26 10 PK STRL BLUE (TOWEL DISPOSABLE) ×2 IMPLANT
TUBE ANAEROBIC SPECIMEN COL (MISCELLANEOUS) IMPLANT
UNDERPAD 30X30 INCONTINENT (UNDERPADS AND DIAPERS) ×2 IMPLANT
WATER STERILE IRR 1000ML POUR (IV SOLUTION) IMPLANT

## 2011-03-16 NOTE — Preoperative (Signed)
Beta Blockers   Reason not to administer Beta Blockers:Not Applicable 

## 2011-03-16 NOTE — Anesthesia Postprocedure Evaluation (Signed)
  Anesthesia Post-op Note  Patient: [name]  Procedure(s) Performed:  AMPUTATION DIGIT - Great toe  Patient Location: PACU  Anesthesia Type: General  Level of Consciousness: awake, alert  and oriented  Airway and Oxygen Therapy: Patient Spontanous Breathing and Patient connected to nasal cannula oxygen  Post-op Pain: none  Post-op Assessment: Post-op Vital signs reviewed, Patient's Cardiovascular Status Stable, Respiratory Function Stable, Patent Airway, No signs of Nausea or vomiting and Pain level controlled  Post-op Vital Signs: Reviewed and stable  Complications: No apparent anesthesia complications

## 2011-03-16 NOTE — Anesthesia Procedure Notes (Signed)
Procedure Name: Intubation Date/Time: 03/16/2011 7:41 AM Performed by: Carmela Rima Pre-anesthesia Checklist: Patient identified, Timeout performed, Emergency Drugs available, Suction available and Patient being monitored Patient Re-evaluated:Patient Re-evaluated prior to inductionOxygen Delivery Method: Circle System Utilized Preoxygenation: Pre-oxygenation with 100% oxygen Intubation Type: IV induction, Rapid sequence and Circoid Pressure applied Laryngoscope Size: Mac and 3 Grade View: Grade I Tube type: Oral Number of attempts: 1 Placement Confirmation: ETT inserted through vocal cords under direct vision,  CO2 detector,  positive ETCO2 and breath sounds checked- equal and bilateral Secured at: 23 cm Tube secured with: Tape Dental Injury: Teeth and Oropharynx as per pre-operative assessment

## 2011-03-16 NOTE — Interval H&P Note (Signed)
History and Physical Interval Note:  03/16/2011 7:36 AM  [name]  has presented today for surgery, with the diagnosis of /  The various methods of treatment have been discussed with the patient and family. After consideration of risks, benefits and other options for treatment, the patient has consented to: AMPUTATION OF LEFT GREAT TOE.  The patients' history has been reviewed, patient examined, no change in status, stable for surgery.  I have reviewed the patients' chart and labs.  Questions were answered to the patient's satisfaction.     DICKSON,CHRISTOPHER S

## 2011-03-16 NOTE — Progress Notes (Signed)
Subjective: Mr [name] is a  70 y.o. man admitted for left great toe gangrene.  He had surgery today to remove the infected toe. His past medical history significant for diabetes and peripheral vascular disease.  He is immediately postop at the time of this note and feels okay. His main complaint is right shoulder pain which has been present for many months. He denies any chest pain or trouble breathing. He would like to resume his diet.   Objective: Vital signs in last 24 hours: Temp:  [97.3 F (36.3 C)-97.8 F (36.6 C)] 97.8 F (36.6 C) (12/23 1019) Pulse Rate:  [55-74] 74  (12/23 1019) Resp:  [14-23] 18  (12/23 1019) BP: (90-123)/(45-93) 100/50 mmHg (12/23 1019) SpO2:  [91 %-100 %] 100 % (12/23 1019) Weight:  [134.5 kg (296 lb 8.3 oz)] 296 lb 8.3 oz (134.5 kg) (12/23 0438) Weight change: 0.1 kg (3.5 oz) Last BM Date: 03/15/11  Intake/Output from previous day: 12/22 0701 - 12/23 0700 In: 2520 [P.O.:1220; IV Piggyback:1300] Out: 1300 [Urine:1300] Intake/Output this shift: Total I/O In: 800 [I.V.:800] Out: 50 [Blood:50]  Exam:  BP 100/50  Pulse 74  Temp(Src) 97.8 F (36.6 C) (Oral)  Resp 18  Ht 5\' 8"  (1.727 m)  Wt 134.5 kg (296 lb 8.3 oz)  BMI 45.09 kg/m2  SpO2 100% Gen: Well NAD HEENT: EOMI,  MMM Lungs: CTABL Nl WOB Heart: RRR no MRG Abd: NABS, NT, ND Exts: Non edematous BL  LE, warm and well perfused.    Lab Results:  Basename 03/16/11 0633 03/15/11 0700  WBC 11.1* 9.6  HGB 12.9* 12.2*  HCT 39.2 37.3*  PLT 220 198   BMET  Basename 03/16/11 0633 03/15/11 0700  NA 138 135  K 4.6 4.2  CL 104 102  CO2 24 25  GLUCOSE 167* 197*  BUN 14 16  CREATININE 1.09 1.04  CALCIUM 9.2 8.9    Studies/Results: Ct Angio Ao+bifem W/cm &/or Wo/cm  03/15/2011  CT ANGIOGRAM ABDOMINAL AORTA AND BILATERAL LOWER EXTREMITIES WITH CONTRAST   IMPRESSION  1.  Diffuse aortoiliac arterial plaque without stenosis. 2.  Patent left fem-pop bypass graft with peroneal and diseased  posterior tibial runoff. 3.  No significant right femoropopliteal occlusive disease.  Tibial runoff is not well opacified. 4.  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 Risk of hemorrhage from this lesion is increased secondary to size greater than 4 cm.     Medications:  Scheduled:   . aspirin  81 mg Oral Daily  . bisoprolol  5 mg Oral BID  . Chlorhexidine Gluconate Cloth  6 each Topical Daily  . diphenhydrAMINE  25 mg Intravenous Once  . furosemide  40 mg Oral Daily  . heparin  5,000 Units Subcutaneous Q8H  . insulin aspart  0-15 Units Subcutaneous TID WC  . insulin aspart  0-5 Units Subcutaneous QHS  . insulin NPH  20 Units Subcutaneous BID  . isosorbide mononitrate  15 mg Oral Daily  . lisinopril  10 mg Oral Daily  . mupirocin ointment  1 application Nasal BID  . piperacillin-tazobactam (ZOSYN)  IV  3.375 g Intravenous Q8H  . rosuvastatin  20 mg Oral QHS  . sodium chloride  3 mL Intravenous Q12H  . tiotropium  18 mcg Inhalation Daily  . vancomycin  1,500 mg Intravenous Q12H  . venlafaxine  37.5 mg Oral Daily  . DISCONTD: vancomycin  1,750 mg Intravenous Q24H   ZOX:WRUEAV chloride, acetaminophen-codeine, diphenhydrAMINE, iohexol, morphine injection, nitroGLYCERIN, ondansetron,  oxyCODONE-acetaminophen, sodium chloride, DISCONTD: 0.9 % irrigation (POUR BTL), DISCONTD: bacitracin, DISCONTD: HYDROmorphone, DISCONTD: promethazine  Assessment/Plan: [name] is a 70 y.o. year old male presenting with Left great toe ulcer, concern for osteomyelitis and cellulitis:  1. Osteomyelitis: S/P toe removal POD #0 today.  He is doing well.  We'll follow with vascular surgery for postop plans. Will consult physical therapy when able to ambulate.  Currently on IV antibiotics we'll continue for one more day and stopped on the 24th, unless surgery feels strongly otherwise.  2. Pain: Continue Tylenol #3 for pain relief.   3. Diastolic CHF: Continue home regimen. Holding  aldosterone antagonist in setting of hypotension. Fluid neutral since admission.   4. Known CAD: Continue ASA and statin.   5. BPH: holding flomax 2/2 hypotension. Will consider restarting vs. Foley if pt develops urinary retention post operatively.   6. DM2: History of poor control, A1C 9.9.  We'll continue sliding scale insulin and resume home medicines upon discharge. -SSI for inhouse CBG control   7. COPD: Continue Spriva in house. Currently controlled per patient symptom report. Oxygenating well on room air   8. H/o blood clot: Were holding coumadin until surgery.  Will resume today with pharmacy consult. Will continue sub q heparin until therapeutic.  9. FEN/GI: diabetic diet,   10. Prophylaxis: heparin ppx   11. Disposition: Likely here for a few days. Will consult physical therapy for needs upon discharge.     LOS: 3 days   Jnyah Brazee 03/16/2011, 10:54 AM

## 2011-03-16 NOTE — Progress Notes (Addendum)
ANTICOAGULATION / ANTIBIOTIC CONSULT NOTE - Follow Up  Pharmacy Consult:  Coumadin Indication:  H/o PE/?CVA  Allergies  Allergen Reactions  . Diazepam     REACTION: unspecified   Vital Signs: Temp: 97.8 F (36.6 C) (12/23 1019) Temp src: Oral (12/23 1019) BP: 100/50 mmHg (12/23 1019) Pulse Rate: 74  (12/23 1019)  Labs:  [facility] 03/16/11 0633 03/15/11 0700 03/14/11 0542  WBC 11.1* 9.6 9.9  HGB 12.9* 12.2* 13.2  PLT 220 198 182  LABCREA -- -- --  CREATININE 1.09 1.04 0.95   Estimated Creatinine Clearance: 82.1 ml/min (by C-G formula based on Cr of 1.09). No results found for this basename: VANCOTROUGH:2,VANCOPEAK:2,VANCORANDOM:2,GENTTROUGH:2,GENTPEAK:2,GENTRANDOM:2,TOBRATROUGH:2,TOBRAPEAK:2,TOBRARND:2,AMIKACINPEAK:2,AMIKACINTROU:2,AMIKACIN:2, in the last 72 hours   Microbiology: Recent Results (from the past 720 hour(s))  MRSA PCR SCREENING     Status: Normal   Collection Time   03/13/11 12:35 PM      Component Value Range Status Comment   MRSA by PCR NEGATIVE  NEGATIVE  Final   WOUND CULTURE     Status: Normal (Preliminary result)   Collection Time   03/13/11  2:20 PM      Component Value Range Status Comment   Culture Moderate MORGANELLA MORGANII   Preliminary    GRAM STAIN No WBC Seen   Preliminary    GRAM STAIN No Squamous Epithelial Cells Seen   Preliminary    GRAM STAIN Few Gram Positive Cocci In Pairs   Preliminary    GRAM STAIN Few Gram Negative Rods   Preliminary    Organism ID, Bacteria MORGANELLA MORGANII   Preliminary    Preliminary Report Moderate STAPHYLOCOCCUS AUREUS   Preliminary   CULTURE, BLOOD (ROUTINE X 2)     Status: Normal (Preliminary result)   Collection Time   03/13/11  2:38 PM      Component Value Range Status Comment   Specimen Description BLOOD RIGHT ARM   Final    Special Requests BOTTLES DRAWN AEROBIC AND ANAEROBIC 10CC   Final    Setup Time 201212202213   Final    Culture     Final    Value:        BLOOD CULTURE RECEIVED NO GROWTH  TO DATE CULTURE WILL BE HELD FOR 5 DAYS BEFORE ISSUING A FINAL NEGATIVE REPORT   Report Status PENDING   Incomplete   CULTURE, BLOOD (ROUTINE X 2)     Status: Normal (Preliminary result)   Collection Time   03/13/11  2:48 PM      Component Value Range Status Comment   Specimen Description BLOOD RIGHT HAND   Final    Special Requests BOTTLES DRAWN AEROBIC AND ANAEROBIC 5CC   Final    Setup Time 201212202213   Final    Culture     Final    Value:        BLOOD CULTURE RECEIVED NO GROWTH TO DATE CULTURE WILL BE HELD FOR 5 DAYS BEFORE ISSUING A FINAL NEGATIVE REPORT   Report Status PENDING   Incomplete   SURGICAL PCR SCREEN     Status: Abnormal   Collection Time   03/16/11  2:57 AM      Component Value Range Status Comment   MRSA, PCR NEGATIVE  NEGATIVE  Final    Staphylococcus aureus POSITIVE (*) NEGATIVE  Final     Medical History: Past Medical History  Diagnosis Date  . Depression   . Diabetes mellitus   . COPD (chronic obstructive pulmonary disease)   . Adhesive capsulitis   .  MRSA bacteremia     2011 - possible endocarditis, received 6 weeks IV treatment  . Hyperlipidemia   . Anxiety   . GERD (gastroesophageal reflux disease)   . CAD (coronary artery disease)   . 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 CVA (cerebral infarction) Questionable history  . Diverticulosis   . Osteomyelitis 2012    Sternoclavicular joint   . Chronic back pain   . Angina   . Myocardial infarction   . CHF (congestive heart failure)   . Shortness of breath   . Sleep apnea   . Chronic kidney disease     hx of BPH  . Neuromuscular disorder     HX of diabetic periferal neuropathy  . Hypertension     Assessment:  ANTICOAG: hx PE/?CVA to continue on heparin SQ and resume Coumadin 12/24 per MD order, s/p toe amputation. INR down 1.36.  ID: Vanc/Zosyn D#4 for L great toe dry gangrene and osteo s/p toe amputation this AM, afebrile, WBC up 11.1, bld cx NGTD.  NEURO:  Anxiety, depression - on venlafaxine PULM: OSA, COPD - 100% 2L [place] - on Spiriva CARDS: Lipidemia, HTN, CAD, CHF - 100/50 HR 74 - on ASA, bisoprolol, Lasix, Imdur, lisinopril, Crestor, spironolactone GI: GERD - not on med, on carb modified diet ENDO: DM, diabetic neuropathy - CBGs 157-270 on SSI + NPH 20 units BID (PTA 70units BID) RENAL: BPH on Flomax, SCr 1.09, CrCL 82 ml/min, lytes WNL HEMONC: H/H/plts stable Best Practices: Heparin SQ, home meds addressed  Home Coumadin dose:  5mg  PO daily except 7.5mg  TTS  Plan:  -Continue vancomycin 1500mg  IV Q12H and Zosyn 3.375g IV q8h -Monitor renal fxn, considering trough Mon evening/Tues AM -Resume Coumadin 03/17/11 per MD order -Continue heparin SQ until INR therapeutic. -F/U insulin titration for better glycemic control   [name] 03/16/2011,11:10 AM

## 2011-03-16 NOTE — Progress Notes (Signed)
VASCULAR PROGRESS NOTE  SUBJECTIVE: no complaints.  PHYSICAL EXAM: Filed Vitals:   03/15/11 2042 03/16/11 0438 03/16/11 0450 03/16/11 0639  BP: 113/71  90/58 111/69  Pulse: 61  55 58  Temp: 97.8 F (36.6 C)  97.3 F (36.3 C) 97.8 F (36.6 C)  TempSrc: Oral  Oral Oral  Resp: 20  20 20   Height:      Weight:  296 lb 8.3 oz (134.5 kg)    SpO2: 92%  95% 93%   The patient underwent toe amputation today. There was no deep space infection.  LABS: Lab Results  Component Value Date   WBC 11.1* 03/16/2011   HGB 12.9* 03/16/2011   HCT 39.2 03/16/2011   MCV 79.8 03/16/2011   PLT 220 03/16/2011   Lab Results  Component Value Date   CREATININE 1.09 03/16/2011   Lab Results  Component Value Date   INR 1.36 03/16/2011   CBG (last 3)   Basename 03/16/11 0634 03/15/11 2145 03/15/11 1622  GLUCAP 157* 207* 270*   ASSESSMENT/PLAN: 1. The patient underwent ray amputation of the left great toe today. There was good bleeding. There was no deep space infection. 2. I reviewed the CT angiogram from yesterday. This does show some diffuse aortoiliac artery occlusive disease but no significant stenosis. The left femoropopliteal bypass graft is patent. There is 2 vessel runoff on the left via the peroneal and posterior tibial arteries which have some moderate disease. Vas although there are no options to improve the circulation to the left foot, the circulation appears to be reasonable and I think he has a reasonable chance of healing the toe amputation. 3. He should continue his intravenous antibiotics for 48 hours and then could be converted to by mouth antibiotics. 4. I will consult physical therapy to teach him how to walk with a Darco shoe with partial weightbearing on the left foot (heel only). 5. He was on Coumadin prior to admission and it is safe to resume this tomorrow. He could start heparin or Lovenox this afternoon from my standpoint.  Waverly Ferrari, MD, FACS Beeper:  [phone] 03/16/2011

## 2011-03-16 NOTE — Anesthesia Preprocedure Evaluation (Addendum)
Anesthesia Evaluation  Patient identified by MRN, date of birth, ID band Patient awake    Reviewed: Allergy & Precautions, H&P , NPO status , Patient's Chart, lab work & pertinent test results  Airway Mallampati: I TM Distance: >3 FB Neck ROM: Full    Dental  (+) Edentulous Upper and Edentulous Lower   Pulmonary shortness of breath, with exertion, at rest, lying and Long-Term Oxygen Therapy, sleep apnea, Continuous Positive Airway Pressure Ventilation and Oxygen sleep apnea , COPD (uses oxygen concentrater prn at home) COPD inhaler,  clear to auscultation Wheezing resolved with Albuterol MDI pre-op Pulmonary exam normal       Cardiovascular hypertension, Pt. on medications + angina + CAD, + Past MI (STEMI 7/12, with cath EF 35%, all grafts occluded, as well as total occlusion RCA, Cx.  LAD patent) and + Peripheral Vascular Disease (for toe amp) Regular Normal On Coumadin, INR 1.58 yesterday   Neuro/Psych    GI/Hepatic Neg liver ROS, GERD-  Medicated and Poorly Controlled,  Endo/Other  Diabetes mellitus- (glu 157 this am), Poorly Controlled, Type 2, Insulin DependentMorbid obesity  Renal/GU negative Renal ROS     Musculoskeletal   Abdominal (+) obese,   Peds  Hematology   Anesthesia Other Findings   Reproductive/Obstetrics                          Anesthesia Physical Anesthesia Plan  ASA: III  Anesthesia Plan: General   Post-op Pain Management:    Induction: Intravenous  Airway Management Planned: Oral ETT  Additional Equipment:   Intra-op Plan:   Post-operative Plan: Extubation in OR  Informed Consent: I have reviewed the patients History and Physical, chart, labs and discussed the procedure including the risks, benefits and alternatives for the proposed anesthesia with the patient or authorized representative who has indicated his/her understanding and acceptance.     Plan Discussed  with: CRNA and Surgeon  Anesthesia Plan Comments: (Plan routine monitors, GETA)        Anesthesia Quick Evaluation

## 2011-03-16 NOTE — Transfer of Care (Signed)
Immediate Anesthesia Transfer of Care Note  Patient: [name]  Procedure(s) Performed:  AMPUTATION DIGIT - ankle block,  Patient Location: PACU  Anesthesia Type: General  Level of Consciousness: awake, alert  and oriented  Airway & Oxygen Therapy: Patient Spontanous Breathing and Patient connected to face mask oxygen  Post-op Assessment: Report given to PACU RN, Post -op Vital signs reviewed and stable and Patient moving all extremities  Post vital signs: Reviewed and stable  Complications: No apparent anesthesia complications

## 2011-03-16 NOTE — H&P (View-Only) (Signed)
VASCULAR PROGRESS NOTE  SUBJECTIVE: No specific complaints this morning.   PHYSICAL EXAM: Filed Vitals:   03/14/11 1141 03/14/11 1420 03/14/11 2136 03/15/11 0443  BP:  102/66 104/67 119/74  Pulse:  67 65 63  Temp:  98.3 F (36.8 C) 97.6 F (36.4 C) 97.7 F (36.5 C)  TempSrc:  Oral    Resp:  20 20 20  Height:      Weight:    296 lb 4.8 oz (134.4 kg)  SpO2: 93% 94% 91% 93%   Lungs: clear to auscultation This morning I am able to palpate a left popliteal pulse. He tells me that she's had a previous bypass graft on both sides. I had a difficult time palpating femoral pulses because of his size. He has gangrene of the left great toe with exposed bone. There is mild cellulitis and some minimal drainage.  LABS: Lab Results  Component Value Date   WBC 9.6 03/15/2011   HGB 12.2* 03/15/2011   HCT 37.3* 03/15/2011   MCV 79.9 03/15/2011   PLT 198 03/15/2011   Lab Results  Component Value Date   CREATININE 1.04 03/15/2011   Lab Results  Component Value Date   INR 1.58* 03/15/2011   CBG (last 3)   Basename 03/15/11 0612 03/14/11 2134 03/14/11 1616  GLUCAP 181* 210* 226*   ABI's: I reviewed his arterial Doppler study which shows an ABI of 99% on the right and 74% on the left.  ASSESSMENT/PLAN: 1. 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 He has evidence of tibial artery occlusive disease based on his Doppler study and physical examination. I am able to palpate a popliteal pulse on the left which suggest that his bypass graft on the left is patent. I am unable to find any records on the computer system of weight and where his bypass grafts were performed. I do think that he will need a amputation of the left great toe which will likely have to be left open because of infection. I'm concerned that he may not have adequate circulation to heal this. 2. I have ordered a CT angiogram of the aorta with bilateral runoff today to further assess his circulation.  Given that he has bilateral femoropopliteal bypass grafts I would prefer not to obtain an arteriogram unless the CT scan does not provide adequate information. 3. I have scheduled amputation of the left great toe for tomorrow a.m. He was on Coumadin prior to admission and this is being held. 4. He is on IV vancomycin and Zosyn which will continue.  Denise Bramblett, MD, FACS Beeper: [phone] 03/15/2011    

## 2011-03-16 NOTE — Progress Notes (Signed)
FMTS Attending Progress Note  Patient seen and examined in his room before being taken to OR for L great toe amputation/debridement osteomyelitis.  Vitals have been controlled; CBG this morning 157.  He denies pain, reports some anxiety about going to OR.   Gen: Alert, breathing comfortably, no apparent distress Lungs with scattered rhonchi, no rales noted.  Assess/Plan: Patient with DM, osteo of L great toe.  On vanc/zosyn; for OR today for debridement.  Will continue to follow. Paula Compton, M.D.

## 2011-03-16 NOTE — Progress Notes (Signed)
CRITICAL VALUE ALERT  Critical value received:  Positive staph aureus PCR  Date of notification:  03/16/11  Time of notification:  0420  Critical value read back:yes  Nurse who received alert:  Bartholomew Boards  MD notified (1st page):   Time of first page:   MD notified (2nd page):  Time of second page:  Responding MD:    Time MD responded:

## 2011-03-16 NOTE — Op Note (Signed)
NAME: [name]   MRN: [id number] DOB: 26-Jan-1941    DATE OF OPERATION: 03/16/2011  PREOP DIAGNOSIS: Gangrene of left great toe with osteomyelitis.  POSTOP DIAGNOSIS: Same  PROCEDURE: Ray amputation of left great toe.  SURGEON: Di Kindle. Edilia Bo, MD, FACS  ASSIST: none  ANESTHESIA: Gen.   EBL: minimal  INDICATIONS: [name] is a 70 y.o. male who presented with gangrene of the left great toe with osteomyelitis. He presents for elective toe amputation.  FINDINGS: there was no deep space abscess.  TECHNIQUE: The patient was brought to the operating room and received a general anesthetic. The left foot was prepped and draped in the usual sterile fashion. A tennis racket incision was made encompassing the left great toe extending to the metatarsal. The bone was dissected free and the periosteum elevated. The metatarsal bone was then divided using a TPS saw. The tendons were retracted into the wound and divided sharply. Hemostasis was obtained in the wound. A culture was obtained of the toe. There was no deep space abscess and I elected to close the wound. A deep layer of 3-0 Vicryl's was placed. The skin was closed with interrupted 4-0 nylon sutures. A sterile dressing was applied, the patient tolerated the procedure well, and was transferred to the recovery room in stable condition. All needle and sponge counts were correct.    Waverly Ferrari, MD, FACS [facility]  DATE OF DICTATION:   03/16/2011

## 2011-03-17 LAB — CBC
Hemoglobin: 12.5 g/dL — ABNORMAL LOW (ref 13.0–17.0)
MCHC: 32.1 g/dL (ref 30.0–36.0)
RBC: 4.74 MIL/uL (ref 4.22–5.81)
WBC: 13.4 10*3/uL — ABNORMAL HIGH (ref 4.0–10.5)

## 2011-03-17 LAB — BASIC METABOLIC PANEL
BUN: 12 mg/dL (ref 6–23)
Chloride: 101 mEq/L (ref 96–112)
GFR calc Af Amer: 67 mL/min — ABNORMAL LOW (ref >90)
GFR calc non Af Amer: 57 mL/min — ABNORMAL LOW (ref >90)
Potassium: 4.7 mEq/L (ref 3.5–5.1)
Sodium: 135 mEq/L (ref 135–145)

## 2011-03-17 LAB — WOUND CULTURE: Gram Stain: NONE SEEN

## 2011-03-17 LAB — PROTIME-INR: Prothrombin Time: 15.8 seconds — ABNORMAL HIGH (ref 11.6–15.2)

## 2011-03-17 LAB — GLUCOSE, CAPILLARY

## 2011-03-17 MED ORDER — INSULIN NPH (HUMAN) (ISOPHANE) 100 UNIT/ML ~~LOC~~ SUSP
30.0000 [IU] | Freq: Two times a day (BID) | SUBCUTANEOUS | Status: DC
  Administered 2011-03-17 (×2): 30 [IU] via SUBCUTANEOUS
  Filled 2011-03-17: qty 10

## 2011-03-17 MED ORDER — TAMSULOSIN HCL 0.4 MG PO CAPS
0.4000 mg | ORAL_CAPSULE | Freq: Every day | ORAL | Status: DC
  Administered 2011-03-17 – 2011-03-18 (×2): 0.4 mg via ORAL
  Filled 2011-03-17 (×4): qty 1

## 2011-03-17 MED ORDER — WARFARIN SODIUM 5 MG PO TABS
5.0000 mg | ORAL_TABLET | Freq: Once | ORAL | Status: AC
  Administered 2011-03-17: 5 mg via ORAL
  Filled 2011-03-17: qty 1

## 2011-03-17 MED ORDER — FUROSEMIDE 40 MG PO TABS
40.0000 mg | ORAL_TABLET | Freq: Two times a day (BID) | ORAL | Status: DC
  Administered 2011-03-17 – 2011-03-18 (×2): 40 mg via ORAL
  Filled 2011-03-17 (×4): qty 1

## 2011-03-17 NOTE — Progress Notes (Signed)
  Subjective:    Patient ID: [name], male    DOB: 1940/09/09, 70 y.o.   MRN: [id number]  HPI Seen and examined.  Feels fine.  Looking forward to DC tomorrow.  I have reviewed management by housestaff and agree with their plans.  The only additional change I made was to increase his lasix to twice daily.  He has positive I&Os over last few days and is edematous on exam.  He is on O2 and states he uses home O2 sometimes.  I anticipate that he will be ready for DC tomorrow.    Review of Systems     Objective:   Physical Exam        Assessment & Plan:

## 2011-03-17 NOTE — Progress Notes (Signed)
Subjective: No new complaints.  Pain in his left foot is well-controlled on Tylenol #3 prn.  He would like to go home.  Denies nausea. Tolerating diet.   Objective: Vital signs in last 24 hours: Temp:  [97.2 F (36.2 C)-98.3 F (36.8 C)] 98.3 F (36.8 C) (12/24 0441) Pulse Rate:  [55-74] 60  (12/24 0441) Resp:  [14-23] 20  (12/24 0441) BP: (100-135)/(41-93) 135/60 mmHg (12/24 0441) SpO2:  [91 %-100 %] 95 % (12/24 0441) Weight:  [297 lb 9.9 oz (135 kg)] 297 lb 9.9 oz (135 kg) (12/24 0441) Weight change: 1 lb 1.6 oz (0.5 kg) Last BM Date: 03/17/11  Intake/Output from previous day: 12/23 0701 - 12/24 0700 In: 1360 [P.O.:560; I.V.:800] Out: 750 [Urine:700; Blood:50] Intake/Output this shift: Total I/O In: 770 [P.O.:120; IV Piggyback:650] Out: 425 [Urine:425]  Physical exam: Gen: Well NAD Lungs: CTABL Nl WOB  Heart: decreased HS RRR no MRG  Abd: NABS, NT, ND  Exts: 1+ bilateral pretibial edema with chronic venous stasis skin changes; calf non-erythematous, non-tender, no swelling; left foot bandaged; right foot normal, including toe nails   Lab Results:  Basename 03/16/11 2356 03/16/11 0633  WBC 13.4* 11.1*  HGB 12.5* 12.9*  HCT 38.9* 39.2  PLT 240 220   BMET  Basename 03/16/11 2356 03/16/11 0633  NA 135 138  K 4.7 4.6  CL 101 104  CO2 25 24  GLUCOSE 179* 167*  BUN 12 14  CREATININE 1.22 1.09  CALCIUM 9.0 9.2    Studies/Results: Dg Shoulder Right  03/16/2011  *RADIOLOGY REPORT*  Clinical Data: Shoulder pain.  RIGHT SHOULDER - 2+ VIEW  Comparison: Plain films 12/10/2009 and MRI 05/11/2010.  Findings: The humerus is located and the acromioclavicular joint is intact.  No fracture.  Glenohumeral acromioclavicular degenerative change is noted.  Imaged right lung and ribs are unremarkable.  IMPRESSION: No acute finding.  Acromioclavicular and glenohumeral degenerative disease.  Original Report Authenticated By: Bernadene Bell. Maricela Curet, M.D.   Ct Angio Ao+bifem W/cm  &/or Wo/cm  03/15/2011  *RADIOLOGY REPORT*  Clinical data: Left great toe gangrene osteomyelitis.  Bilateral fem-pop grafts.  CT ANGIOGRAM ABDOMINAL AORTA AND BILATERAL LOWER EXTREMITIES WITH CONTRAST  Technique:  Axial helical CT of the abdominal aorta and bilateral lower extremities after Optiray 350 IV.  Coronal and sagittal reconstructions were generated for vascular evaluation.  Comparison:  06/20/2006 and earlier studies  Findings:Abdominal aorta:  Diffuse atheromatous plaque without aneurysm, dissection, or stenosis.  Celiac axis:  Scattered nonocclusive plaque, widely patent, normal distal branching  Superior mesenteric artery: scattered nonocclusive plaque proximally, widely patent with classic distal branching.  Left renal artery:  Single, with a small amount of nonocclusive ostial plaque, patent distally  Right renal artery:  Single, with scattered plaque through  the first 2 cm without significant stenosis, widely patent distally  Inferior mesenteric artery:  Patent  Bifurcation:  Extensive partially calcified plaque without stenosis.  Left iliac system:  Scattered partially calcified plaque through the common and internal iliac arteries and in the distal external iliac artery.  No aneurysm or stenosis.  Right iliac system:  Ectatic proximal right common iliac artery measuring up to 18 mm diameter.  There is scattered calcified plaque without stenosis.  Left lower extremity:  Common femoral artery is ectatic with mural calcifications.  Profunda femoris branches patent.  Proximal SFA has diffuse atheromatous calcifications.   There is a patent distal fem-pop graft to the popliteal artery below the knee, with retrograde flow into the more  proximal native popliteal artery. The distal anastomosis is widely patent.  The anterior tibial artery occludes at the proximal calf level.  Tibioperoneal trunk is patent, with contiguous diseased peroneal and posterior tibial runoff, the posterior tibial artery  patent across the ankle as the primary blood supply to the foot.  Right lower extremity:  Mild plaque in the common femoral and superficial femoral arteries without stenosis.  There is poor arterial opacification below the   proximal popliteal artery, limiting assessment of right tibial runoff.  Scattered arterial calcifications are evident.  The venous phase imaging was not obtained.  There is linear scarring or subpleural atelectasis or subpleural scarring posteriorly in the visualized lung bases.  Unremarkable arterial phase evaluation of the liver, gallbladder, spleen, adrenal glands.  Mild fatty infiltration of the pancreas.  There is a 6.6 cm largely fatty tumor from the inferior pole of the right kidney ( previously 4.6 cm).  A 2 cm exophytic hyperdense lesion from the interpolar region of the right kidney is largely stable. Left kidney unremarkable.  Stomach and small bowel are decompressed.  Appendix normal.  Colon is nondilated.  Urinary bladder physiologically distended.  Changes of left hip arthroplasty, resulting in streak artifact degrading some of the images.  IMPRESSION  1.  Diffuse aortoiliac arterial plaque without stenosis. 2.  Patent left fem-pop bypass graft with peroneal and diseased posterior tibial runoff. 3.  No significant right femoropopliteal occlusive disease.  Tibial runoff is not well opacified. 4.  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 Risk of hemorrhage from this lesion is increased secondary to size greater than 4 cm.  Original Report Authenticated By: Osa Craver, M.D.    Medications:  I have reviewed the patient's current medications. Scheduled:   . aspirin  81 mg Oral Daily  . bisoprolol  5 mg Oral BID  . Chlorhexidine Gluconate Cloth  6 each Topical Daily  . furosemide  40 mg Oral Daily  . heparin  5,000 Units Subcutaneous Q8H  . insulin aspart  0-15 Units Subcutaneous TID WC  . insulin aspart  0-5 Units Subcutaneous QHS  .  insulin NPH  30 Units Subcutaneous BID  . isosorbide mononitrate  15 mg Oral Daily  . lisinopril  10 mg Oral Daily  . mupirocin ointment  1 application Nasal BID  . piperacillin-tazobactam (ZOSYN)  IV  3.375 g Intravenous Q8H  . rosuvastatin  20 mg Oral QHS  . sodium chloride  3 mL Intravenous Q12H  . Tamsulosin HCl  0.4 mg Oral QPC breakfast  . tiotropium  18 mcg Inhalation Daily  . vancomycin  1,500 mg Intravenous Q12H  . venlafaxine  37.5 mg Oral Daily  . DISCONTD: insulin NPH  20 Units Subcutaneous BID  . DISCONTD: warfarin  7.5 mg Oral ONCE-1800   Continuous:   . DISCONTD: lactated ringers     ZOX:WRUEAV chloride, acetaminophen-codeine, diphenhydrAMINE, morphine injection, nitroGLYCERIN, ondansetron, sodium chloride, DISCONTD: HYDROmorphone, DISCONTD: oxyCODONE-acetaminophen, DISCONTD: promethazine  Assessment/Plan: [name] is a 70 y.o. year old male presenting with left great toe ulcer, concern for osteomyelitis and cellulitis:   1. Osteomyelitis: POD #1 s/p left first toe amputation. He is doing well. Will follow vascular surgery recommendations. -Continue antibiotics IV through tomorrow. -Follow-up PT.   2. Pain: Continue Tylenol #3 for pain relief.   3. Diastolic CHF: Continue home regimen. Fluid neutral since admission. -Continue home anti-hypertensives. Will continue to hold aldosterone.  4. Known CAD: Continue ASA and statin.  5. BPH: -Will re-add home Flomax and D/C Foley.  6. DM2: History of poor control, A1C 9.9.  -Continue SSI. -Will increase insulin NPH from 20 to 30 bid (70 bid at home).  7. COPD: Continue Spriva in house. Currently controlled per patient symptom report. Oxygenating well on room air   8. H/o blood clot: -Continue home warfarin. Heparin SQ until therapeutic (had been holding warfarin for surgery).   9. FEN/GI: diabetic diet. KVO IVF.  10. Prophylaxis: warfarin/heparin  11. Disposition: Discharge tomorrow if stable with  follow-up with Dr. Durwin Nora in 2 weeks.     LOS: 4 days   [facility], ANGELA 03/17/2011, 8:33 AM [phone]

## 2011-03-17 NOTE — Progress Notes (Signed)
Physical Therapy Evaluation Patient Details Name: [name] MRN: [id number] DOB: 12-Mar-1941 Today's Date: 03/17/2011  Problem List:  Patient Active Problem List  Diagnoses  . DIABETES MELLITUS, TYPE II  . DIABETIC PERIPHERAL NEUROPATHY  . HYPERCHOLESTEROLEMIA  . ANXIETY  . DEPRESSION  . HYPERTENSION  . CORONARY, ARTERIOSCLEROSIS  . CHF - EJECTION FRACTION < 50%  . COPD  . REFLUX ESOPHAGITIS  . FROZEN RIGHT SHOULDER  . APNEA, SLEEP  . Encounter for long-term (current) use of anticoagulants  . BPH (benign prostatic hyperplasia)  . Venous stasis dermatitis  . COPD (chronic obstructive pulmonary disease)  . Osteomyelitis  . Vancomycin adverse reaction    Past Medical History:  Past Medical History  Diagnosis Date  . Depression   . Diabetes mellitus   . COPD (chronic obstructive pulmonary disease)   . Adhesive capsulitis   . MRSA bacteremia     2011 - possible endocarditis, received 6 weeks IV treatment  . Hyperlipidemia   . Anxiety   . GERD (gastroesophageal reflux disease)   . CAD (coronary artery disease)   . 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 CVA (cerebral infarction) Questionable history  . Diverticulosis   . Osteomyelitis 2012    Sternoclavicular joint   . Chronic back pain   . Angina   . Myocardial infarction   . CHF (congestive heart failure)   . Shortness of breath   . Sleep apnea   . Chronic kidney disease     hx of BPH  . Neuromuscular disorder     HX of diabetic periferal neuropathy  . Hypertension    Past Surgical History:  Past Surgical History  Procedure Date  . Septic arthritis     Removal of infected CABG wire by Dr Edwyna Shell - 2011  . Coronary artery bypass graft 1992  . Fracture surgery 1980s    Hip  . Spine surgery 2002    Cervical fusion vertebroplasty C3-4-5  . Coronary stent placement 1999, 2002  . Doppler echocardiography Sept 2011    EF 50-55% with some impaired diastolic relaxation  .  Colonoscopy 2006     Multiple polyps removed, repeat in 5 years  . Right iliopopliteal bypass - 1983 1983  . Femoral-popliteal bypass graft 2008    Left  . Cardiac catheterization October 18, 2010    Known obstructive disease, no further blockages  . Cardiac catheterization 03/17/2005    EF 35-40%  . US echocardiography 03/04/2006    EF 50-55%    PT Assessment/Plan/Recommendation PT Assessment Clinical Impression Statement: Pt is a 70 y/o male admitted s/p right great toe amputation along with the below PT problem list.  Pt would benefit from acute PT to maximize independence and facilitate d/c home with HHPT to follow. PT Recommendation/Assessment: Patient will need skilled PT in the acute care venue PT Problem List: Decreased activity tolerance;Decreased balance;Decreased mobility;Decreased knowledge of use of DME;Decreased knowledge of precautions;Pain Barriers to Discharge: None PT Therapy Diagnosis : Acute pain;Difficulty walking PT Plan PT Frequency: Min 3X/week PT Treatment/Interventions: DME instruction;Gait training;Functional mobility training;Therapeutic activities;Balance training;Patient/family education PT Recommendation Follow Up Recommendations: Home health PT Equipment Recommended: None recommended by PT PT Goals  Acute Rehab PT Goals PT Goal Formulation: With patient Time For Goal Achievement: 7 days Pt will go Sit to Stand: with modified independence PT Goal: Sit to Stand - Progress: Not met Pt will go Stand to Sit: with modified independence PT Goal: Stand to Sit -  Progress: Not met Pt will Ambulate: >150 feet;with modified independence;with least restrictive assistive device PT Goal: Ambulate - Progress: Not met  PT Evaluation Precautions/Restrictions  Precautions Precautions: Fall Required Braces or Orthoses: Yes Other Brace/Splint: Right Darco Shoe Restrictions Weight Bearing Restrictions: Yes RLE Weight Bearing: Partial weight bearing RLE Partial  Weight Bearing Percentage or Pounds: Through heel only using Darco shoe. Prior Functioning  Home Living Lives With: Spouse Receives Help From: Family Type of Home: House Home Layout: One level Home Access: Ramped entrance Home Adaptive Equipment: Walker - rolling Prior Function Level of Independence: Independent with basic ADLs;Independent with homemaking with ambulation;Independent with gait;Independent with transfers Able to Take Stairs?: Yes Driving: No Cognition Cognition Arousal/Alertness: Awake/alert Overall Cognitive Status: Appears within functional limits for tasks assessed Orientation Level: Oriented X4 Sensation/Coordination Sensation Light Touch: Impaired by gross assessment Stereognosis: Not tested Hot/Cold: Not tested Proprioception: Not tested Coordination Gross Motor Movements are Fluid and Coordinated: Yes Fine Motor Movements are Fluid and Coordinated: Not tested Extremity Assessment RUE Assessment RUE Assessment: Within Functional Limits LUE Assessment LUE Assessment: Within Functional Limits RLE Assessment RLE Assessment: Within Functional Limits LLE Assessment LLE Assessment: Within Functional Limits Pain 5/10 in right shoulder (chronic pain).  Pt repositioned and RN notified. Mobility (including Balance) Bed Mobility Bed Mobility: Yes Supine to Sit: 6: Modified independent (Device/Increase time);HOB flat Transfers Transfers: Yes Sit to Stand: 4: Min assist;With upper extremity assist;From bed;From chair/3-in-1 (2 trials.) Sit to Stand Details (indicate cue type and reason): Assist for balance with cues for safest hand placement. Stand to Sit: 4: Min assist;With upper extremity assist;To chair/3-in-1;To bed (2 trials.) Stand to Sit Details: Assist to slow descent to chair with cues for safest hand placement. Ambulation/Gait Ambulation/Gait: Yes Ambulation/Gait Assistance: 4: Min assist Ambulation/Gait Assistance Details (indicate cue type and  reason): Assist for balance with cues for sequence using step-to inside RW.  LOB x 1 trial with min assist to correct.  Standing rest breaks secondary to DOE. Ambulation Distance (Feet): 94 Feet Assistive device: Rolling walker Gait Pattern: Step-to pattern;Trunk flexed;Decreased step length - right Stairs: No Wheelchair Mobility Wheelchair Mobility: No  Posture/Postural Control Posture/Postural Control: No significant limitations Balance Balance Assessed: No End of Session PT - End of Session Equipment Utilized During Treatment: Gait belt (Right LE Darco Shoe.) Activity Tolerance: Patient tolerated treatment well Patient left: in bed;with call bell in reach (Sitting EOB.) Nurse Communication: Mobility status for transfers;Mobility status for ambulation General Behavior During Session: [facility] for tasks performed Cognition: [facility] for tasks performed  Cephus Shelling 03/17/2011, 1:57 PM  03/17/2011 Cephus Shelling, PT, DPT [phone]

## 2011-03-17 NOTE — Progress Notes (Signed)
Patient ID: [name], male   DOB: 1940-03-29, 70 y.o.   MRN: [id number] Vascular Surgery Progress Note  Subjective: One day post left first toe amputation by Dr. Edilia Bo  Objective:  Filed Vitals:   03/17/11 0441  BP: 135/60  Pulse: 60  Temp: 98.3 F (36.8 C)  Resp: 20    Dressing clean and dry-will change and check 54   Labs:  Lab 03/16/11 2356 03/16/11 0633 03/15/11 0700  CREATININE 1.22 1.09 1.04    Lab 03/16/11 2356 03/16/11 0633 03/15/11 0700  NA 135 138 135  K 4.7 4.6 4.2  CL 101 104 102  CO2 25 24 25   BUN 12 14 16   CREATININE 1.22 1.09 1.04  LABGLOM -- -- --  GLUCOSE 179* -- --  CALCIUM 9.0 9.2 8.9    Lab 03/16/11 2356 03/16/11 0633 03/15/11 0700  WBC 13.4* 11.1* 9.6  HGB 12.5* 12.9* 12.2*  HCT 38.9* 39.2 37.3*  PLT 240 220 198    Lab 03/17/11 0610 03/16/11 0633 03/15/11 0700  INR 1.23 1.36 1.58*    I/O last 3 completed shifts: In: 2140 [P.O.:740; I.V.:800; IV Piggyback:600] Out: 1250 [Urine:1200; Blood:50]  Imaging: Dg Shoulder Right  03/16/2011  *RADIOLOGY REPORT*  Clinical Data: Shoulder pain.  RIGHT SHOULDER - 2+ VIEW  Comparison: Plain films 12/10/2009 and MRI 05/11/2010.  Findings: The humerus is located and the acromioclavicular joint is intact.  No fracture.  Glenohumeral acromioclavicular degenerative change is noted.  Imaged right lung and ribs are unremarkable.  IMPRESSION: No acute finding.  Acromioclavicular and glenohumeral degenerative disease.  Original Report Authenticated By: Bernadene Bell. Maricela Curet, M.D.   Ct Angio Ao+bifem W/cm &/or Wo/cm  03/15/2011  *RADIOLOGY REPORT*  Clinical data: Left great toe gangrene osteomyelitis.  Bilateral fem-pop grafts.  CT ANGIOGRAM ABDOMINAL AORTA AND BILATERAL LOWER EXTREMITIES WITH CONTRAST  Technique:  Axial helical CT of the abdominal aorta and bilateral lower extremities after Optiray 350 IV.  Coronal and sagittal reconstructions were generated for vascular evaluation.  Comparison:   06/20/2006 and earlier studies  Findings:Abdominal aorta:  Diffuse atheromatous plaque without aneurysm, dissection, or stenosis.  Celiac axis:  Scattered nonocclusive plaque, widely patent, normal distal branching  Superior mesenteric artery: scattered nonocclusive plaque proximally, widely patent with classic distal branching.  Left renal artery:  Single, with a small amount of nonocclusive ostial plaque, patent distally  Right renal artery:  Single, with scattered plaque through  the first 2 cm without significant stenosis, widely patent distally  Inferior mesenteric artery:  Patent  Bifurcation:  Extensive partially calcified plaque without stenosis.  Left iliac system:  Scattered partially calcified plaque through the common and internal iliac arteries and in the distal external iliac artery.  No aneurysm or stenosis.  Right iliac system:  Ectatic proximal right common iliac artery measuring up to 18 mm diameter.  There is scattered calcified plaque without stenosis.  Left lower extremity:  Common femoral artery is ectatic with mural calcifications.  Profunda femoris branches patent.  Proximal SFA has diffuse atheromatous calcifications.   There is a patent distal fem-pop graft to the popliteal artery below the knee, with retrograde flow into the more proximal native popliteal artery. The distal anastomosis is widely patent.  The anterior tibial artery occludes at the proximal calf level.  Tibioperoneal trunk is patent, with contiguous diseased peroneal and posterior tibial runoff, the posterior tibial artery patent across the ankle as the primary blood supply to the foot.  Right lower extremity:  Mild plaque  in the common femoral and superficial femoral arteries without stenosis.  There is poor arterial opacification below the   proximal popliteal artery, limiting assessment of right tibial runoff.  Scattered arterial calcifications are evident.  The venous phase imaging was not obtained.  There is linear  scarring or subpleural atelectasis or subpleural scarring posteriorly in the visualized lung bases.  Unremarkable arterial phase evaluation of the liver, gallbladder, spleen, adrenal glands.  Mild fatty infiltration of the pancreas.  There is a 6.6 cm largely fatty tumor from the inferior pole of the right kidney ( previously 4.6 cm).  A 2 cm exophytic hyperdense lesion from the interpolar region of the right kidney is largely stable. Left kidney unremarkable.  Stomach and small bowel are decompressed.  Appendix normal.  Colon is nondilated.  Urinary bladder physiologically distended.  Changes of left hip arthroplasty, resulting in streak artifact degrading some of the images.  IMPRESSION  1.  Diffuse aortoiliac arterial plaque without stenosis. 2.  Patent left fem-pop bypass graft with peroneal and diseased posterior tibial runoff. 3.  No significant right femoropopliteal occlusive disease.  Tibial runoff is not well opacified. 4.  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 Risk of hemorrhage from this lesion is increased secondary to size greater than 4 cm.  Original Report Authenticated By: Osa Craver, M.D.    Assessment/Plan:  POD #1  LOS: 4 days  s/p Procedure(s): AMPUTATION DIGIT-left first toe  Doing well from a vascular standpoint post left first toe amputation. Dr. Durwin Nora requested 48 hours IV antibiotics which will run through tomorrow Okay to discharge on December 25 with oral antibiotics and return to see Dr. Durwin Nora in 2 weeks   [name], MD 03/17/2011 8:04 AM

## 2011-03-17 NOTE — Progress Notes (Signed)
Orthopedic Tech Progress Note Patient Details:  AGUSTUS MANE 11/06/1940 [id number]  Other Ortho Devices Ortho Device Location: Skip Estimable shoe   Shawnie Pons 03/17/2011, 11:29 AM

## 2011-03-17 NOTE — Progress Notes (Addendum)
ANTICOAGULATION - Follow Up  Pharmacy Consult:  Coumadin Indication:  H/o PE/?CVA  Allergen  . Diazepam   Vital Signs: Temp: 98.3 F (36.8 C) (12/24 0441) BP: 135/60 mmHg (12/24 0441) Pulse Rate: 60  (12/24 0441)  Labs:  Basename 03/16/11 2356 03/16/11 0633 03/15/11 0700  WBC 13.4* 11.1* 9.6  HGB 12.5* 12.9* 12.2*  PLT 240 220 198  LABCREA -- -- --  CREATININE 1.22 1.09 1.04    Medical History: Past Medical History  Diagnosis Date  . Depression   . Diabetes mellitus   . COPD (chronic obstructive pulmonary disease)   . Adhesive capsulitis   . MRSA bacteremia     2011 - possible endocarditis, received 6 weeks IV treatment  . Hyperlipidemia   . Anxiety   . GERD (gastroesophageal reflux disease)   . CAD (coronary artery disease)   . 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 CVA (cerebral infarction) Questionable history  . Diverticulosis   . Osteomyelitis 2012    Sternoclavicular joint   . Chronic back pain   . Angina   . Myocardial infarction   . CHF (congestive heart failure)   . Shortness of breath   . Sleep apnea   . Chronic kidney disease     hx of BPH  . Neuromuscular disorder     HX of diabetic periferal neuropathy  . Hypertension     Assessment:  ANTICOAG:   Hx PE/?CVA -Heparin SQ and Coumadin restarted today.  S/P toe amputation. INR down 1.23.  ID:    Vanc/Zosyn D#5 for L great toe dry gangrene and osteo, afebrile, bld cx NGTD.  NEURO:  Anxiety, depression - on venlafaxine PULM:   OSA, COPD - 95% 2L [place] - on Tiotropium CARDS:   Lipidemia, HTN, CAD, CHF - 100/50 HR 74 - on ASA, bisoprolol, Lasix, Imdur, lisinopril, Crestor GI:    GERD - not on med, on carb modified diet ENDO:   DM, diabetic neuropathy - CBGs 157-270 on SSI + NPH 20 units BID (PTA 70units BID) RENAL:   SCr 1.22, CrCL 72 ml/min,  GU:   Hx. BPH - on Tamsulosin HEME/ONC:   H/H/plts stable Best Practices:  Heparin SQ, Warfarin home meds reviewed  Home  Coumadin dose:  5mg  PO daily except 7.5mg  TTS  Plan:   - Resume Coumadin at 5mg  today and check AM PT/I  - Continue heparin SQ until INR therapeutic.  - Will get Vancomycin trough and BMET with AM labs   Nadara Mustard, PharmD., MS 03/17/2011,9:06 AM

## 2011-03-18 LAB — PROTIME-INR
INR: 1.2 (ref 0.00–1.49)
Prothrombin Time: 15.5 seconds — ABNORMAL HIGH (ref 11.6–15.2)

## 2011-03-18 LAB — GLUCOSE, CAPILLARY: Glucose-Capillary: 192 mg/dL — ABNORMAL HIGH (ref 70–99)

## 2011-03-18 MED ORDER — FUROSEMIDE 40 MG PO TABS: 40.0000 mg | ORAL_TABLET | Freq: Two times a day (BID) | ORAL | Status: DC

## 2011-03-18 MED ORDER — INSULIN NPH (HUMAN) (ISOPHANE) 100 UNIT/ML ~~LOC~~ SUSP: 35.0000 [IU] | Freq: Two times a day (BID) | SUBCUTANEOUS | Status: DC

## 2011-03-18 MED ORDER — ALBUTEROL SULFATE HFA 108 (90 BASE) MCG/ACT IN AERS
2.0000 | INHALATION_SPRAY | RESPIRATORY_TRACT | Status: DC
  Administered 2011-03-18: 2 via RESPIRATORY_TRACT
  Filled 2011-03-18: qty 6.7

## 2011-03-18 MED ORDER — WARFARIN SODIUM 7.5 MG PO TABS
7.5000 mg | ORAL_TABLET | Freq: Once | ORAL | Status: DC
  Filled 2011-03-18: qty 1

## 2011-03-18 MED ORDER — SPIRONOLACTONE 12.5 MG HALF TABLET
12.5000 mg | ORAL_TABLET | Freq: Every day | ORAL | Status: DC
  Administered 2011-03-18: 12.5 mg via ORAL
  Filled 2011-03-18: qty 1

## 2011-03-18 MED ORDER — ACETAMINOPHEN-CODEINE #3 300-30 MG PO TABS: 1.0000 | ORAL_TABLET | ORAL | Status: DC | PRN

## 2011-03-18 MED ORDER — ALBUTEROL SULFATE HFA 108 (90 BASE) MCG/ACT IN AERS: 2.0000 | INHALATION_SPRAY | RESPIRATORY_TRACT | Status: DC | PRN

## 2011-03-18 NOTE — Progress Notes (Signed)
CM contacted by Junius Roads, RN to assist with home care for dry dressing changes.  Pt for d/c 03/18/11.  RN reported dressing has been changed on 03/18/11.  Encourage reinforcement supply be sent with pt for 03/19/11.  Pt has family support services as needed until home services are rendered.  Orders from MD.  Pt choice of agency is [facility].  CM spoke with Selena Batten at [facility] who confirms pt can be seen by agency but not on 03/18/11.  CM updated RN, requested pt & MD be updated and provided with [facility] contact number for any possible emergency. Referral completed via [facility].

## 2011-03-18 NOTE — Progress Notes (Addendum)
ANTICOAGULATION / ANTIBIOTIC CONSULT NOTE - Follow Up  Pharmacy Consult:  Coumadin, vancomycin/zosyn D#6 Indication:  H/o PE/?CVA  Allergies  Allergen Reactions  . Diazepam     REACTION: unspecified   Vital Signs: Temp: 98.2 F (36.8 C) (12/25 0428) BP: 104/58 mmHg (12/25 0428) Pulse Rate: 69  (12/25 0428)  Labs:  Basename 03/18/11 0515 03/17/11 0610 03/16/11 2356 03/16/11 0633  HGB -- -- 12.5* 12.9*  HCT -- -- 38.9* 39.2  PLT -- -- 240 220  APTT -- -- -- --  LABPROT 15.5* 15.8* -- 17.0*  INR 1.20 1.23 -- 1.36  HEPARINUNFRC -- -- -- --  CREATININE -- -- 1.22 1.09  CKTOTAL -- -- -- --  CKMB -- -- -- --  TROPONINI -- -- -- --    Basename 03/16/11 2356 03/16/11 0633  WBC 13.4* 11.1*  HGB 12.5* 12.9*  PLT 240 220  LABCREA -- --  CREATININE 1.22 1.09   Estimated Creatinine Clearance: 73.5 ml/min (by C-G formula based on Cr of 1.22). Microbiology: Recent Results (from the past 720 hour(s))  MRSA PCR SCREENING     Status: Normal   Collection Time   03/13/11 12:35 PM      Component Value Range Status Comment   MRSA by PCR NEGATIVE  NEGATIVE  Final   WOUND CULTURE     Status: Normal   Collection Time   03/13/11  2:20 PM      Component Value Range Status Comment   Culture     Final    Value: Moderate MORGANELLA MORGANII     Moderate STAPHYLOCOCCUS AUREUS   GRAM STAIN No WBC Seen   Final    GRAM STAIN No Squamous Epithelial Cells Seen   Final    GRAM STAIN Few Gram Positive Cocci In Pairs   Final    GRAM STAIN Few Gram Negative Rods   Final    Organism ID, Bacteria MORGANELLA MORGANII   Final    Organism ID, Bacteria STAPHYLOCOCCUS AUREUS   Final   CULTURE, BLOOD (ROUTINE X 2)     Status: Normal (Preliminary result)   Collection Time   03/13/11  2:38 PM      Component Value Range Status Comment   Specimen Description BLOOD RIGHT ARM   Final    Special Requests BOTTLES DRAWN AEROBIC AND ANAEROBIC 10CC   Final    Setup Time 201212202213   Final    Culture      Final    Value:        BLOOD CULTURE RECEIVED NO GROWTH TO DATE CULTURE WILL BE HELD FOR 5 DAYS BEFORE ISSUING A FINAL NEGATIVE REPORT   Report Status PENDING   Incomplete   CULTURE, BLOOD (ROUTINE X 2)     Status: Normal (Preliminary result)   Collection Time   03/13/11  2:48 PM      Component Value Range Status Comment   Specimen Description BLOOD RIGHT HAND   Final    Special Requests BOTTLES DRAWN AEROBIC AND ANAEROBIC 5CC   Final    Setup Time 201212202213   Final    Culture     Final    Value:        BLOOD CULTURE RECEIVED NO GROWTH TO DATE CULTURE WILL BE HELD FOR 5 DAYS BEFORE ISSUING A FINAL NEGATIVE REPORT   Report Status PENDING   Incomplete   SURGICAL PCR SCREEN     Status: Abnormal   Collection Time   03/16/11  2:57 AM  Component Value Range Status Comment   MRSA, PCR NEGATIVE  NEGATIVE  Final    Staphylococcus aureus POSITIVE (*) NEGATIVE  Final   WOUND CULTURE     Status: Normal (Preliminary result)   Collection Time   03/16/11  8:25 AM      Component Value Range Status Comment   Specimen Description WOUND LEFT FOOT   Final    Special Requests SPECIMEN A, PT ON VANCO AND ZINACEF   Final    Gram Stain     Final    Value: NO WBC SEEN     NO SQUAMOUS EPITHELIAL CELLS SEEN     MODERATE GRAM NEGATIVE RODS     FEW GRAM POSITIVE RODS     RARE GRAM POSITIVE COCCI   Culture     Final    Value: FEW STAPHYLOCOCCUS AUREUS     Note: RIFAMPIN AND GENTAMICIN SHOULD NOT BE USED AS SINGLE DRUGS FOR TREATMENT OF STAPH INFECTIONS.   Report Status PENDING   Incomplete   AFB CULTURE WITH SMEAR     Status: Normal (Preliminary result)   Collection Time   03/16/11  8:25 AM      Component Value Range Status Comment   Specimen Description WOUND LEFT FOOT   Final    Special Requests SPECIMEN A, PT ON VANCO AND ZINACEF   Final    ACID FAST SMEAR NO ACID FAST BACILLI SEEN   Final    Culture     Final    Value: CULTURE WILL BE EXAMINED FOR 6 WEEKS BEFORE ISSUING A FINAL REPORT    Report Status PENDING   Incomplete     Medical History: Past Medical History  Diagnosis Date  . Depression   . Diabetes mellitus   . COPD (chronic obstructive pulmonary disease)   . Adhesive capsulitis   . MRSA bacteremia     2011 - possible endocarditis, received 6 weeks IV treatment  . Hyperlipidemia   . Anxiety   . GERD (gastroesophageal reflux disease)   . CAD (coronary artery disease)   . 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 CVA (cerebral infarction) Questionable history  . Diverticulosis   . Osteomyelitis 2012    Sternoclavicular joint   . Chronic back pain   . Angina   . Myocardial infarction   . CHF (congestive heart failure)   . Shortness of breath   . Sleep apnea   . Chronic kidney disease     hx of BPH  . Neuromuscular disorder     HX of diabetic periferal neuropathy  . Hypertension     Assessment:  ANTICOAG: hx PE/?CVA to continue on heparin SQ and resumed Coumadin 12/24  per MD order, s/p toe amputation. INR =1.2.  ID: Vanc/Zosyn D#6 for L great toe dry gangrene and osteo s/p toe amputation. , afebrile, bld cx NGTD, 12/20 wound cx (pre-amputation) = MSSA and morganella (R to amp, amp/sulb, cefazolin).  NEURO: Anxiety, depression - on venlafaxine PULM: OSA, COPD - 100% 2L [place] - on Spiriva CARDS: VSS, PMH= Lipidemia, HTN, CAD, CHF - on ASA, bisoprolol, Lasix, Imdur, lisinopril, Crestor, spironolactone GI: GERD - not on med, on carb modified diet ENDO: DM, diabetic neuropathy - CBGs 170-190 on SSI + NPH 30 units BID (PTA 70units BID) RENAL: BPH on Flomax, SCr 1.22 HEM/ONC: H/H/plts stable with last CBC Best Practices: Heparin SQ, home meds addressed  Home Coumadin dose:  5mg  PO daily except 7.5mg  TTS  Plan:  1. Continue vancomycin 1500mg  IV Q12H and Zosyn 3.375g IV q8h, physian's notes say stopping antibiotics today (and 12/24 notes says he is going home today) so will not get trough 2. Coumadin 7.5mg  tonight, INR in am  3.  Continue heparin SQ until INR therapeutic   Dannielle Huh 03/18/2011,9:17 AM

## 2011-03-18 NOTE — Progress Notes (Signed)
[facility] will be [facility] Went over D/C instructions with pt and family. Pt requested po tab prior to leaving due to foot pain and travel per car. Any extra dressings in room given to pt. For reinforcement if any seepage noted on foot dressing. PA did change dressing @ 9 am.   Marisa Cyphers RN

## 2011-03-18 NOTE — Discharge Summary (Signed)
Physician Discharge Summary  Patient ID: [name] MRN: [id number] DOB/AGE: 04/09/1940 70 y.o.  Admit date: 03/13/2011 Discharge date: 03/18/2011  Admission Diagnoses: Left great toe ulcer concerning for cellulitis and osteomyelitis  Discharge Diagnoses:  Principal Problem:  *Osteomyelitis Active Problems:  DIABETES MELLITUS, TYPE II  HYPERTENSION  CHF - EJECTION FRACTION < 50%  Venous stasis dermatitis  COPD (chronic obstructive pulmonary disease)  Vancomycin adverse reaction   Discharged Condition: good  Hospital Course: BURNARD ENIS is a 70 y.o. year old male presenting with left great toe terminal phalanx osteomyelitis and cellulitis with ulceration.  Left great toe cellulitis Vascular surgeon Dr. Cari Caraway performed a ray amputation of that toe on 12/23. The patient did well following surgery. He was able to ambulate wearing a Darco shoe near discharge, and his pain was well controlled with an occasional prn narcotic. IV antibiotics vancomycin and Zosyn were continued through 12/25. He was afebrile with stable vital signs following his procedure. PT recommended HH PT 3 times a week. Lower extremity edema The patient has chronic venous stasis changes in his lower extremities. Due to 1+ bilateral lower extremity edema and a positive fluid balance, the patient's home Lasix was doubled from 40 mg daily to 40 mg bid. He was asked to continue the higher dose of Lasix at home.  History of BPH The patient was able to urinate without difficulty after his Foley was discontinued and his home Flomax was re-started.  History of poorly controlled type 2 diabetes mellitus Recent Hgb A1c was 9.9. The patient is on 70 units of insulin NPH at home. His sugars were in the mid to upper 100s on a reduced dose of 35 units bid, and he was sent home on this dose.  History of COPD A slight wheeze was detected near the time of discharge. The patient was sating in the mid to upper 90s on 2L [place].  The patient has oxygen at home and does use it occasionally. He was asked to continue his albuterol inhaler every 4 hours while awake for the next couple of days. He was also asked to resume his home Spiriva.  History of blood clot The patient is on warfarin at home. This medication was held for his procedure and resumed on 12/23. His INR at the time of discharge was subtherapeutic at 1.20. He was asked to resume to home warfarin and to follow-up the next day at his primary care physician's office for INR monitoring.   FOLLOW-UP ISSUES: 1. PCP: Please re-evaluate need for increased Lasix doseage. 2. PCP: Patient's home insulin NPH was decreased from home dose at time of discharge (35 bid instead of 70 bid). I anticipate this dose will likely need to be increased following his discharge.  3. PCP: Patient sent home with subtherapeutic INR. Please confirm that patient is having this monitored at [facility].   Consults: vascular surgery  Treatments: left great toe ray amputation   Discharge Exam: Blood pressure 104/58, pulse 69, temperature 98.2 F (36.8 C), temperature source Oral, resp. rate 20, height 5\' 8"  (1.727 m), weight 297 lb 2.9 oz (134.8 kg), SpO2 95.00%.  Disposition: Home or Self Care  Discharge Orders    Future Appointments: Provider: Department: Dept Phone: Center:   03/19/2011 8:45 AM [facility] [phone] [facility]   04/16/2011 12:30 PM Chuck Hint, MD [facility] [phone] [facility]   10/13/2011 10:00 AM [facility] [phone] [facility]   10/13/2011 10:30 AM [facility]  [phone] [facility]   10/13/2011 11:00 AM V Durene Cal, MD [facility] [phone] [facility]     Medication List  As of 03/18/2011  6:51 AM   START taking these medications         * acetaminophen-codeine 300-30 MG per tablet   Commonly known as: TYLENOL #3   Take 1 tablet by mouth every 4 (four) hours as needed.      albuterol 108 (90 BASE) MCG/ACT inhaler    Commonly known as: PROVENTIL HFA;VENTOLIN HFA   Inhale 2 puffs into the lungs every 4 (four) hours as needed for wheezing.      * furosemide 40 MG tablet   Commonly known as: LASIX   Take 1 tablet (40 mg total) by mouth 2 (two) times daily.     * Notice: This list has 2 medication(s) that are the same as other medications prescribed for you. Read the directions carefully, and ask your doctor or other care provider to review them with you.       CHANGE how you take these medications         * furosemide 40 MG tablet   Commonly known as: LASIX   Take 1 tablet (40 mg total) by mouth 2 (two) times daily.   What changed: how often to take the med      insulin NPH 100 UNIT/ML injection   Commonly known as: HUMULIN N,NOVOLIN N   Inject 35 Units into the skin 2 (two) times daily.   What changed: dose     * Notice: This list has 1 medication(s) that are the same as other medications prescribed for you. Read the directions carefully, and ask your doctor or other care provider to review them with you.       CONTINUE taking these medications         * acetaminophen-codeine 300-30 MG per tablet   Commonly known as: TYLENOL #3      BABY ASPIRIN 81 MG chewable tablet   Generic drug: aspirin      bisoprolol 5 MG tablet   Commonly known as: ZEBETA      isosorbide mononitrate 30 MG 24 hr tablet   Commonly known as: IMDUR      lisinopril 10 MG tablet   Commonly known as: PRINIVIL,ZESTRIL      nitroGLYCERIN 0.4 MG SL tablet   Commonly known as: NITROSTAT      simvastatin 80 MG tablet   Commonly known as: ZOCOR      spironolactone 25 MG tablet   Commonly known as: ALDACTONE      Tamsulosin HCl 0.4 MG Caps   Commonly known as: FLOMAX      tiotropium 18 MCG inhalation capsule   Commonly known as: SPIRIVA      venlafaxine 37.5 MG 24 hr capsule   Commonly known as: EFFEXOR-XR   Take 1 capsule (37.5 mg total) by mouth daily.      warfarin 5 MG tablet   Commonly known as: COUMADIN       * Notice: This list has 1 medication(s) that are the same as other medications prescribed for you. Read the directions carefully, and ask your doctor or other care provider to review them with you.        Where to get your medications    These are the prescriptions that you need to pick up. We sent them to a specific pharmacy, so you will need to go there to get them.   [id number] - [place], [place] - [address] AT [facility] OF [address]    [address]  [place] [address]    Phone: [phone]        albuterol 108 (90 BASE) MCG/ACT inhaler   furosemide 40 MG tablet   insulin NPH 100 UNIT/ML injection         You may get these medications from any pharmacy.         acetaminophen-codeine 300-30 MG per tablet           Follow-up Information    Follow up with DICKSON,CHRISTOPHER S, MD in 2 weeks.   Contact information:   [address] [place] [address] [phone]       Follow up with [facility] in 1 week. (Please call tomorrow and make an appointment to be seen next week)    Contact information:   [address] [place] [address] [phone]       Follow up with [facility] in 1 day. (Please call tomorrow and make a lab appointment to get your INR tomorrow)    Contact information:   [address] [place] [address] [phone]          Signed: Madolyn Frieze, ANGELA 03/18/2011, 6:51 AM

## 2011-03-18 NOTE — Progress Notes (Signed)
Subjective: No complaints. He was able to walk yesterday with PT wearing Darco shoe. His pain is under control.   Objective: Vital signs in last 24 hours: Temp:  [98 F (36.7 C)-98.2 F (36.8 C)] 98.2 F (36.8 C) (12/25 0428) Pulse Rate:  [63-92] 69  (12/25 0428) Resp:  [20] 20  (12/25 0428) BP: (85-131)/(44-68) 104/58 mmHg (12/25 0428) SpO2:  [92 %-97 %] 95 % (12/25 0428) Weight:  [297 lb 2.9 oz (134.8 kg)] 297 lb 2.9 oz (134.8 kg) (12/25 0431) Weight change: -7.1 oz (-0.2 kg) Last BM Date: 03/17/11  Intake/Output from previous day: 12/24 0701 - 12/25 0700 In: 2110 [P.O.:360; IV Piggyback:1750] Out: 2100 [Urine:2100] Intake/Output this shift: Total I/O In: 790 [P.O.:240; IV Piggyback:550] Out: 975 [Urine:975]   Physical exam: Gen: Well NAD  Lungs: no increased WOB, occasional end expiratory wheeze, no crackles or ronchi; good aeration Heart: decreased HS RRR no MRG  Abd: NABS, NT, ND  Exts: 1+ bilateral pretibial edema with chronic venous stasis skin changes (about the same as yesterday); calf non-erythematous, non-tender, no swelling; left foot bandaged; right foot normal, including toe nails    Lab Results:  [facility] 03/16/11 2356 03/16/11 0633  WBC 13.4* 11.1*  HGB 12.5* 12.9*  HCT 38.9* 39.2  PLT 240 220   BMET  Basename 03/16/11 2356 03/16/11 0633  NA 135 138  K 4.7 4.6  CL 101 104  CO2 25 24  GLUCOSE 179* 167*  BUN 12 14  CREATININE 1.22 1.09  CALCIUM 9.0 9.2    Studies/Results: Dg Shoulder Right  03/16/2011  *RADIOLOGY REPORT*  Clinical Data: Shoulder pain.  RIGHT SHOULDER - 2+ VIEW  Comparison: Plain films 12/10/2009 and MRI 05/11/2010.  Findings: The humerus is located and the acromioclavicular joint is intact.  No fracture.  Glenohumeral acromioclavicular degenerative change is noted.  Imaged right lung and ribs are unremarkable.  IMPRESSION: No acute finding.  Acromioclavicular and glenohumeral degenerative disease.  Original Report  Authenticated By: Bernadene Bell. Maricela Curet, M.D.    Medications:  Scheduled:   . aspirin  81 mg Oral Daily  . bisoprolol  5 mg Oral BID  . Chlorhexidine Gluconate Cloth  6 each Topical Daily  . furosemide  40 mg Oral BID  . heparin  5,000 Units Subcutaneous Q8H  . insulin aspart  0-15 Units Subcutaneous TID WC  . insulin aspart  0-5 Units Subcutaneous QHS  . insulin NPH  30 Units Subcutaneous BID  . isosorbide mononitrate  15 mg Oral Daily  . lisinopril  10 mg Oral Daily  . mupirocin ointment  1 application Nasal BID  . piperacillin-tazobactam (ZOSYN)  IV  3.375 g Intravenous Q8H  . rosuvastatin  20 mg Oral QHS  . sodium chloride  3 mL Intravenous Q12H  . Tamsulosin HCl  0.4 mg Oral QPC breakfast  . tiotropium  18 mcg Inhalation Daily  . vancomycin  1,500 mg Intravenous Q12H  . venlafaxine  37.5 mg Oral Daily  . warfarin  5 mg Oral ONCE-1800  . DISCONTD: furosemide  40 mg Oral Daily  . DISCONTD: insulin NPH  20 Units Subcutaneous BID   Continuous:  ZOX:WRUEAV chloride, acetaminophen-codeine, diphenhydrAMINE, morphine injection, nitroGLYCERIN, ondansetron, sodium chloride, DISCONTD: oxyCODONE-acetaminophen  Assessment/Plan: [name] is a 70 y.o. year old male presenting with left great toe terminal phalanx osteomyelitis and cellulitis with ulceration.   Osteomyelitis: POD #2 s/p left first toe amputation. He is doing well.  -Will follow vascular surgery recommendations.  -Will discontinue IV  antibiotics today.  -PT recommending HH PT. -Pain controlled. Received 1 dose of prn morphine yesterday. Will discontinue and give Tylenol #3 prn.   Diastolic CHF: Lasix increased yesterday due to concern for positive fluid balance based on I/O and lower extremity edema. Net positive 610 yesterday with unchanged lower extremity edema.  -Will continue bisoprolol 5mg  bid, Imdur 15 mg, lisinopril 10 mg. Will re-add home spironolactone 12.5 mg qd. Will continue Lasix at higher dose (40 bid  instead of qd at home). Will recommend follow-up in 1 week to adjust Lasix dose as needed.  Known CAD: Continue ASA and statin.   BPH: Flomax re-added yesterday and Foley D/C'd. Patient urinating without difficulty.   DM2: History of poor control, A1C 9.9.  -Continue SSI.  -Will increase insulin NPH from 30 to 35 bid (70 bid at home).  COPD: Sounds more wheezy today. O2 saturations mid-upper 90s on 2L [place].  -Continue home Spiriva. -Albuterol q4 while awake today and tomorrow.   H/o blood clot: Warfarin re-started a couple of days ago following his surgical procedure. INR subtherapeutic at 1.20 this morning.  -Continue home warfarin dose. Will re-check INR at [facility] tomorrow. Do not need heparin bridge (history of remote PE 10-15 years ago).      FEN/GI: diabetic diet. KVO IVF.   Prophylaxis:  DVT PPx: warfarin/heparin  Disposition:  Discharge home today.  Follow-up with Dr. Durwin Nora in 2 weeks and with PCP in 1-2 weeks.     LOS: 5 days   OH PARK, ANGELA 03/18/2011, 6:23 AM

## 2011-03-18 NOTE — Progress Notes (Addendum)
[facility] [place]  Postoperative Visit - Amputation  Date of Surgery:  03/16/2011 Procedure:   AMPUTATION DIGIT Ray amputation of left great toe Surgeon: Surgeon(s): Chuck Hint, MD POD: 2 Days Post-Op  Subjective [name] is a 70 y.o. male who is S/P Left great toe AMPUTATION DIGIT. There is no drainage or redness at wound. Pt.denies increased pain in the stump. The patient notes pain is well controlled. He is using Darko shoe to ambulate.  Significant Diagnostic Studies: CBC    Component Value Date/Time   WBC 13.4* 03/16/2011 2356   RBC 4.74 03/16/2011 2356   HGB 12.5* 03/16/2011 2356   HCT 38.9* 03/16/2011 2356   PLT 240 03/16/2011 2356   MCV 82.1 03/16/2011 2356   MCH 26.4 03/16/2011 2356   MCHC 32.1 03/16/2011 2356   RDW 17.5* 03/16/2011 2356   LYMPHSABS 2.2 03/16/2011 0633   MONOABS 0.8 03/16/2011 0633   EOSABS 0.4 03/16/2011 0633   BASOSABS 0.0 03/16/2011 0633    BMET    Component Value Date/Time   NA 135 03/16/2011 2356   K 4.7 03/16/2011 2356   CL 101 03/16/2011 2356   CO2 25 03/16/2011 2356   GLUCOSE 179* 03/16/2011 2356   BUN 12 03/16/2011 2356   CREATININE 1.22 03/16/2011 2356   CREATININE 1.06 11/01/2010 1020   CALCIUM 9.0 03/16/2011 2356   GFRNONAA 57* 03/16/2011 2356   GFRAA 67* 03/16/2011 2356    COAG Lab Results  Component Value Date   INR 1.20 03/18/2011   INR 1.23 03/17/2011   INR 1.36 03/16/2011   No results found for this basename: PTT     Intake/Output Summary (Last 24 hours) at 03/18/11 1610 Last data filed at 03/18/11 0839  Gross per 24 hour  Intake   1580 ml  Output   1675 ml  Net    -95 ml   No data found.    Physical Examination  BP Readings from Last 3 Encounters:  03/18/11 104/58  03/18/11 104/58  03/13/11 110/60   Temp Readings from Last 3 Encounters:  03/18/11 98.2 F (36.8 C)   03/18/11 98.2 F (36.8 C)   02/18/11 98 F (36.7 C) Oral   SpO2 Readings from Last  3 Encounters:  03/18/11 97%  03/18/11 97%  11/01/10 88%    Pt is A&Ox3  WDWN male with no complaints  Left amputation wound is healing well. Sutures are intact There is good bone coverage in the stump Stump is warm and well perfused, without drainage; without erythema   Assessment/plan:  [name] is a 70 y.o. male who is s/p Left Great toe AMPUTATION DIGIT  The patient's amp site is viable.  Follow-up 2 weeks from surgery  Use Darko shoe to ambulate  Daily dressing changes - HH RN - HH face to face completed  [place]e [place] 9:38 AM 03/18/2011 [phone]  Toe amputation healing Darko shoe for ambulation Going home today Will F/U with Dr Edilia Bo in a few weeks.  Fabienne Bruns, MD [facility] [place] Office: [phone] Pager: [phone]

## 2011-03-18 NOTE — Progress Notes (Signed)
Call to [facility] for social work. Made aware of pts pending dicsharge and need for HH to change foot dressing daily.         Marisa Cyphers RN

## 2011-03-19 ENCOUNTER — Other Ambulatory Visit (HOSPITAL_COMMUNITY): Payer: Self-pay | Admitting: Family Medicine

## 2011-03-19 ENCOUNTER — Telehealth: Payer: Self-pay | Admitting: Family Medicine

## 2011-03-19 ENCOUNTER — Ambulatory Visit: Payer: Medicare HMO

## 2011-03-19 LAB — CULTURE, BLOOD (ROUTINE X 2)
Culture  Setup Time: 201212202213
Culture: NO GROWTH

## 2011-03-19 LAB — WOUND CULTURE: Gram Stain: NONE SEEN

## 2011-03-19 MED ORDER — ACETAMINOPHEN-CODEINE #3 300-30 MG PO TABS: 1.0000 | ORAL_TABLET | Freq: Four times a day (QID) | ORAL | Status: DC | PRN

## 2011-03-19 NOTE — Telephone Encounter (Signed)
Refill request

## 2011-03-19 NOTE — Telephone Encounter (Signed)
rx called to pharmacy 

## 2011-03-19 NOTE — Progress Notes (Signed)
Patient to be seen today by Dr. Jennette Kettle as attending.

## 2011-03-19 NOTE — Discharge Summary (Signed)
Family Medicine Teaching Service  Discharge Note : Attending Fairley Copher MD Pager [phone] Office [phone] I have seen and examined this patient, reviewed their chart and discussed discharge planning wit the resident at the time of discharge. I agree with the discharge plan as above.  

## 2011-03-20 ENCOUNTER — Encounter (HOSPITAL_COMMUNITY): Payer: Self-pay | Admitting: Vascular Surgery

## 2011-03-20 ENCOUNTER — Ambulatory Visit (INDEPENDENT_AMBULATORY_CARE_PROVIDER_SITE_OTHER): Payer: Medicare HMO | Admitting: *Deleted

## 2011-03-20 DIAGNOSIS — Z8672 Personal history of thrombophlebitis: Secondary | ICD-10-CM

## 2011-03-20 DIAGNOSIS — Z7901 Long term (current) use of anticoagulants: Secondary | ICD-10-CM

## 2011-03-26 ENCOUNTER — Ambulatory Visit (INDEPENDENT_AMBULATORY_CARE_PROVIDER_SITE_OTHER): Payer: Medicare Other | Admitting: *Deleted

## 2011-03-26 DIAGNOSIS — Z8672 Personal history of thrombophlebitis: Secondary | ICD-10-CM

## 2011-03-26 DIAGNOSIS — Z7901 Long term (current) use of anticoagulants: Secondary | ICD-10-CM

## 2011-04-01 ENCOUNTER — Ambulatory Visit (INDEPENDENT_AMBULATORY_CARE_PROVIDER_SITE_OTHER): Payer: Medicare Other | Admitting: Family Medicine

## 2011-04-01 ENCOUNTER — Encounter: Payer: Self-pay | Admitting: Family Medicine

## 2011-04-01 ENCOUNTER — Ambulatory Visit (INDEPENDENT_AMBULATORY_CARE_PROVIDER_SITE_OTHER): Payer: Medicare Other | Admitting: *Deleted

## 2011-04-01 VITALS — BP 139/56 | HR 91 | Temp 98.8°F | Wt 293.4 lb

## 2011-04-01 DIAGNOSIS — Z8672 Personal history of thrombophlebitis: Secondary | ICD-10-CM

## 2011-04-01 DIAGNOSIS — Z7901 Long term (current) use of anticoagulants: Secondary | ICD-10-CM

## 2011-04-01 DIAGNOSIS — F329 Major depressive disorder, single episode, unspecified: Secondary | ICD-10-CM

## 2011-04-01 DIAGNOSIS — L03116 Cellulitis of left lower limb: Secondary | ICD-10-CM | POA: Insufficient documentation

## 2011-04-01 DIAGNOSIS — F3289 Other specified depressive episodes: Secondary | ICD-10-CM

## 2011-04-01 DIAGNOSIS — M869 Osteomyelitis, unspecified: Secondary | ICD-10-CM

## 2011-04-01 DIAGNOSIS — L0390 Cellulitis, unspecified: Secondary | ICD-10-CM

## 2011-04-01 DIAGNOSIS — L0291 Cutaneous abscess, unspecified: Secondary | ICD-10-CM

## 2011-04-01 DIAGNOSIS — E119 Type 2 diabetes mellitus without complications: Secondary | ICD-10-CM

## 2011-04-01 MED ORDER — NITROGLYCERIN 0.4 MG SL SUBL: 0.4000 mg | SUBLINGUAL_TABLET | SUBLINGUAL | Status: DC | PRN

## 2011-04-01 MED ORDER — ACETAMINOPHEN-CODEINE #3 300-30 MG PO TABS: 1.0000 | ORAL_TABLET | Freq: Four times a day (QID) | ORAL | Status: DC | PRN

## 2011-04-01 MED ORDER — INSULIN NPH (HUMAN) (ISOPHANE) 100 UNIT/ML ~~LOC~~ SUSP: 70.0000 [IU] | Freq: Two times a day (BID) | SUBCUTANEOUS | Status: DC

## 2011-04-01 MED ORDER — SIMVASTATIN 80 MG PO TABS: 80.0000 mg | ORAL_TABLET | Freq: Every day | ORAL | Status: DC

## 2011-04-01 MED ORDER — ALBUTEROL SULFATE HFA 108 (90 BASE) MCG/ACT IN AERS: 2.0000 | INHALATION_SPRAY | RESPIRATORY_TRACT | Status: DC | PRN

## 2011-04-01 MED ORDER — CIPROFLOXACIN HCL 500 MG PO TABS: 500.0000 mg | ORAL_TABLET | Freq: Two times a day (BID) | ORAL | Status: AC

## 2011-04-01 MED ORDER — AMOXICILLIN 500 MG PO CAPS: 500.0000 mg | ORAL_CAPSULE | Freq: Two times a day (BID) | ORAL | Status: AC

## 2011-04-01 MED ORDER — ISOSORBIDE MONONITRATE ER 30 MG PO TB24: 15.0000 mg | ORAL_TABLET | Freq: Every day | ORAL | Status: DC

## 2011-04-01 MED ORDER — SPIRONOLACTONE 25 MG PO TABS: 12.5000 mg | ORAL_TABLET | Freq: Every day | ORAL | Status: DC

## 2011-04-01 MED ORDER — LISINOPRIL 10 MG PO TABS: 10.0000 mg | ORAL_TABLET | Freq: Every day | ORAL | Status: DC

## 2011-04-01 MED ORDER — WARFARIN SODIUM 7.5 MG PO TABS: 7.5000 mg | ORAL_TABLET | ORAL | Status: DC

## 2011-04-01 MED ORDER — BISOPROLOL FUMARATE 5 MG PO TABS: 5.0000 mg | ORAL_TABLET | Freq: Two times a day (BID) | ORAL | Status: DC

## 2011-04-01 MED ORDER — TAMSULOSIN HCL 0.4 MG PO CAPS: 0.4000 mg | ORAL_CAPSULE | Freq: Every day | ORAL | Status: DC

## 2011-04-01 MED ORDER — VENLAFAXINE HCL ER 37.5 MG PO CP24: 37.5000 mg | ORAL_CAPSULE | Freq: Every day | ORAL | Status: DC

## 2011-04-01 MED ORDER — TIOTROPIUM BROMIDE MONOHYDRATE 18 MCG IN CAPS: 18.0000 ug | ORAL_CAPSULE | Freq: Every day | RESPIRATORY_TRACT | Status: DC

## 2011-04-01 MED ORDER — WARFARIN SODIUM 5 MG PO TABS: 5.0000 mg | ORAL_TABLET | Freq: Every day | ORAL | Status: DC

## 2011-04-01 MED ORDER — AMOXICILLIN 500 MG PO CAPS: 500.0000 mg | ORAL_CAPSULE | Freq: Two times a day (BID) | ORAL | Status: DC

## 2011-04-01 MED ORDER — CIPROFLOXACIN HCL 500 MG PO TABS: 500.0000 mg | ORAL_TABLET | Freq: Two times a day (BID) | ORAL | Status: DC

## 2011-04-01 MED ORDER — FUROSEMIDE 40 MG PO TABS: 40.0000 mg | ORAL_TABLET | Freq: Two times a day (BID) | ORAL | Status: DC

## 2011-04-01 NOTE — Assessment & Plan Note (Signed)
S/p amputation of toe.   No concerns until this weekend.   See cellulitis below.   He is currently walking well using his walker.

## 2011-04-01 NOTE — Assessment & Plan Note (Signed)
Will continue at current 70 units BID. Hopefully hyperglycemia is from poor diet rather than signs of infection. Will recheck A1C and believe he would benefit from pharmacy clinic referral in near future.

## 2011-04-01 NOTE — Progress Notes (Signed)
  Subjective:    Patient ID: [name], male    DOB: April 01, 1940, 71 y.o.   MRN: [id number]  HPI 1.  Hospital fu:  Hospitalized after I saw him in clinic with bad wound infection of Left great toe.  Ray amputation performed in house.  Since leaving hospital, has felt [facility] improved by both his and wife's account.  Ambulates with walker.  Is going to be re-measured for diabetic shoes by home health aide.  Foot without pain.  Some mild redness and drainage which appeared over the weekend.  No fevers, chills, abdominal pain, nausea vomiting  2. Diabetes:  Left hospital on 35 units BID.  Checking CBG's daily, states they started running in 300 - 400s and he has since increased to 70 units BID dosing.  CBGs now in high 100s.  No hypoglycemic episodes.  3.  Incontinence:  Worse at night since leaving hospital and taking BID dosing of Lasix.  He has since switched himself to 2 pills in the AM.  Has also generally improved since starting Flomax.  Only has 1-2 episodes of night wakening to urinate when previously he was up almost every hour.  No day time incontinence.   Review of Systems See HPI above for review of systems.      Objective:   Physical Exam Gen:  Alert, cooperative patient who appears stated age in no acute distress.  Vital signs reviewed. Pulm:  Clear to auscultation bilaterally with good air movement.  No wheezes or rales noted.   Cardiac:  Regular rate and rhythm without murmur auscultated.  Good S1/S2. Ext:  +1 edema BL LE's to mid-shins.  Chronic venous stasis changes noted.  Left toe s/p Ray amputation.  Stitches still in place.  Some mild overlying erythema which extends about 1 cm along dorsal aspect of foot.  Also with some minimal drainage from incision site, purulent.  Now with 0.2 mm callus noted Left 2nd digit lateral aspect.         Assessment & Plan:

## 2011-04-01 NOTE — Assessment & Plan Note (Signed)
No evidence of abscess.   Concern because incision site with minimal purulence, want to catch this early. I was unable to express any purulence on my exam. Will treat with Amox and Cipro to prevent any further worsening of cellulitis.  Discussed he needs to return sooner if any acute worsening, otherwise return in 7 days for recheck.

## 2011-04-01 NOTE — Patient Instructions (Signed)
Follow Bonnie's instructions for your Coumadin.   Keep taking the 70 units for your blood sugars.   I will send in an order for the home health nurse.   Come back and see me in 10 days so we can recheck that toe.  Take the 2 antibiotics twice a day until they're gone.

## 2011-04-03 ENCOUNTER — Telehealth: Payer: Self-pay | Admitting: *Deleted

## 2011-04-03 NOTE — Telephone Encounter (Signed)
[name] with [facility] calling requesting more orders to continue wound care for [name].  States his orders ran out today. Would like to continue seeing [name] once or twice a week x 3 to 4 weeks.  Verbal order given to continue wound care.  Advised to fax over any papers Dr. Tyson Alias will need to sign. Ileana Ladd

## 2011-04-04 NOTE — Telephone Encounter (Signed)
Appreciate help.  Agree with plan.

## 2011-04-09 ENCOUNTER — Ambulatory Visit (INDEPENDENT_AMBULATORY_CARE_PROVIDER_SITE_OTHER): Payer: Medicare Other | Admitting: *Deleted

## 2011-04-09 ENCOUNTER — Ambulatory Visit (INDEPENDENT_AMBULATORY_CARE_PROVIDER_SITE_OTHER): Payer: Medicare Other | Admitting: Family Medicine

## 2011-04-09 ENCOUNTER — Encounter: Payer: Self-pay | Admitting: Family Medicine

## 2011-04-09 VITALS — BP 104/61 | HR 105 | Temp 97.8°F | Ht 68.0 in | Wt 291.3 lb

## 2011-04-09 DIAGNOSIS — W19XXXA Unspecified fall, initial encounter: Secondary | ICD-10-CM | POA: Insufficient documentation

## 2011-04-09 DIAGNOSIS — I798 Other disorders of arteries, arterioles and capillaries in diseases classified elsewhere: Secondary | ICD-10-CM

## 2011-04-09 DIAGNOSIS — Z7901 Long term (current) use of anticoagulants: Secondary | ICD-10-CM

## 2011-04-09 DIAGNOSIS — Z8672 Personal history of thrombophlebitis: Secondary | ICD-10-CM

## 2011-04-09 DIAGNOSIS — E1159 Type 2 diabetes mellitus with other circulatory complications: Secondary | ICD-10-CM

## 2011-04-09 NOTE — Assessment & Plan Note (Addendum)
Believe he is hypotensive plus still learning how to ambulate s/p Left great toe removal. Cannot remember last time he took nitroglycerin, so do not believe this is contributing. Plan to decrease Lasix to just once daily, he is to weigh himself at home and increase Lasix when his weight goes up four pounds.  See instructions. Also holding Imdur for now as he is asymptomatic.  Delicate balance between optimization of heart failure and preventing hypotension/falls.  He is also on Coumadin so definitely do not want him falling and suffering intracranial bleed. Patient refuses any imaging of foot.  He is to continue to wear his diabetic shoe for support.   Physical therapy came last Friday and is scheduled to return tomorrow.

## 2011-04-09 NOTE — Progress Notes (Signed)
  Subjective:    Patient ID: [name], male    DOB: Aug 04, 1940, 71 y.o.   MRN: [id number]  HPI Work-in patient, [name] was here for INR check and complained of falling, so I will see him today:  1.  Falls:  Has had 2 falls in past week, first was Sunday (3 days ago) and second last night.  First time was when getting out of bed, second one occurred tripping over rug.  Feels he is still learning how to ambulate s/p amputation Right toe.  Feels lightheaded when he stands, takes him several moments to equilibrate.  Landed on Right side both times.  Did not lose consciousness, complaining of shoulder and foot pain.  No headaches, nausea, vomiting, changes in vision.    Review of Systems See HPI above for review of systems.       Objective:   Physical Exam Gen:  Alert, cooperative patient who appears stated age in no acute distress.  Vital signs reviewed.  Orthostatics completed HEENT:  [name]/AT.  EOMI, PERRL.  MMM, tonsils non-erythematous, non-edematous.  External ears WNL, Bilateral TM's normal without retraction, redness or bulging.  Cardiac:  Regular rate and rhythm without murmur auscultated.  Good S1/S2. Lungs:  Distant BS Ext:  +2 edema BL LE, LLE with chronic venous stasis changes noted.  S/p amputation Left great toe, healing well.   Right foot:  TTP.  No real swelling or ecchymosis noted.   Right shoulder:  TTP.  Refuses to engage in ROM testing.       Assessment & Plan:

## 2011-04-09 NOTE — Patient Instructions (Signed)
Stop taking the Imdur.  If you have chest pain, call me or come in. Take the Lasix 40 mg a day.   Weigh yourself everyday. If you increase in weight by 2 lbs 2 days in a row, taken an extra Lasix for 3 days.  Then go back to 1 pill when your weight drops back.  We want to prevent your blood pressure from going low and you falling.

## 2011-04-15 ENCOUNTER — Ambulatory Visit: Payer: Medicare Other | Admitting: Family Medicine

## 2011-04-15 ENCOUNTER — Encounter: Payer: Self-pay | Admitting: Vascular Surgery

## 2011-04-16 ENCOUNTER — Encounter: Payer: Self-pay | Admitting: Vascular Surgery

## 2011-04-16 ENCOUNTER — Ambulatory Visit (INDEPENDENT_AMBULATORY_CARE_PROVIDER_SITE_OTHER): Payer: Medicare Other | Admitting: Vascular Surgery

## 2011-04-16 VITALS — BP 112/70 | HR 56 | Temp 97.5°F | Ht 68.0 in | Wt 292.0 lb

## 2011-04-16 DIAGNOSIS — Z8739 Personal history of other diseases of the musculoskeletal system and connective tissue: Secondary | ICD-10-CM | POA: Insufficient documentation

## 2011-04-16 DIAGNOSIS — E1159 Type 2 diabetes mellitus with other circulatory complications: Secondary | ICD-10-CM

## 2011-04-16 DIAGNOSIS — E1165 Type 2 diabetes mellitus with hyperglycemia: Secondary | ICD-10-CM | POA: Insufficient documentation

## 2011-04-16 DIAGNOSIS — I739 Peripheral vascular disease, unspecified: Secondary | ICD-10-CM | POA: Insufficient documentation

## 2011-04-16 MED ORDER — CEPHALEXIN 500 MG PO CAPS: 500.0000 mg | ORAL_CAPSULE | Freq: Three times a day (TID) | ORAL | Status: AC

## 2011-04-16 NOTE — Progress Notes (Signed)
[facility] [place]  Patient name: [name] MRN: [id number] DOB: [birth year] Sex: male  REASON FOR VISIT: follow up after left great toe amputation  HPI: [name] is a 71 y.o. male who underwent ray amputation of the left great toe on 03/16/2011. He had presented with gangrene of the left great toe with osteomyelitis. I seen in consultation in the hospital. He had a bypass graft in the left leg elsewhere with a palpable popliteal pulse and I felt that his graft was patent. 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 He does of his obesity and chronic venous insufficiency with leg swelling is a poor candidate for revascularization.  He underwent a CT scan while in the hospital which showed: diffuse aortoiliac artery occlusive disease but no significant stenosis. The left femoropopliteal bypass graft is patent. There is 2 vessel runoff on the left via the peroneal and posterior tibial arteries which have some moderate disease. There were no options to improve the circulation to the left foot, however the circulation appears to be reasonable.   Past Medical History  Diagnosis Date  . Depression   . Diabetes mellitus   . COPD (chronic obstructive pulmonary disease)   . Adhesive capsulitis   . MRSA bacteremia     2011 - possible endocarditis, received 6 weeks IV treatment  . Hyperlipidemia   . Anxiety   . GERD (gastroesophageal reflux disease)   . CAD (coronary artery disease)   . 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 CVA (cerebral infarction) Questionable history  . Diverticulosis   . Osteomyelitis 2012    Sternoclavicular joint   . Chronic back pain   . Angina   . Myocardial infarction   . CHF (congestive heart failure)   . Shortness of breath   . Sleep apnea   . Chronic kidney disease     hx of BPH  . Neuromuscular disorder     HX of diabetic periferal neuropathy  .  Hypertension    REVIEW OF SYSTEMS: Arly.Keller ] denotes positive finding; [  ] denotes negative finding  CARDIOVASCULAR:  [ ]  chest pain   [ ]  chest pressure   [ ]  palpitations   [ ]  orthopnea  CONSTITUTIONAL:  [ ]  fever   [ ]  chills  PHYSICAL EXAM: Filed Vitals:   04/16/11 1243  BP: 112/70  Pulse: 56  Temp: 97.5 F (36.4 C)  TempSrc: Oral  Height: 5\' 8"  (1.727 m)  Weight: 292 lb (132.45 kg)  SpO2: 96%   Body mass index is 44.40 kg/(m^2). GENERAL: The patient is a well-nourished male, in no acute distress. The vital signs are documented above. CARDIOVASCULAR: There is a regular rate and rhythm without significant murmur appreciated.  PULMONARY: There is good air exchange bilaterally without wheezing or rales. The toe amputation site appears to be healing adequately and his sutures were removed today. There was some mild cellulitis noted at the amputation site.  DATA:   Lab Results  Component Value Date   INR 2.7 04/09/2011   INR 3.6 04/01/2011   INR 3.0 03/26/2011   MEDICAL ISSUES: So far the toe is healing adequately on the left. I'll see him back in 2 months. He knows to call sooner if he has problems. He did have some mild erythema so I have written him for 10 days Keflex.  DICKSON,CHRISTOPHER S [facility] [place] Beeper:  [phone]   

## 2011-04-23 ENCOUNTER — Ambulatory Visit (INDEPENDENT_AMBULATORY_CARE_PROVIDER_SITE_OTHER): Payer: Medicare Other | Admitting: *Deleted

## 2011-04-23 DIAGNOSIS — Z7901 Long term (current) use of anticoagulants: Secondary | ICD-10-CM

## 2011-04-23 DIAGNOSIS — Z8672 Personal history of thrombophlebitis: Secondary | ICD-10-CM

## 2011-04-23 LAB — POCT INR: INR: 3.4

## 2011-05-04 LAB — AFB CULTURE WITH SMEAR (NOT AT ARMC)

## 2011-05-06 ENCOUNTER — Ambulatory Visit (INDEPENDENT_AMBULATORY_CARE_PROVIDER_SITE_OTHER): Payer: Medicare Other | Admitting: *Deleted

## 2011-05-06 ENCOUNTER — Ambulatory Visit (INDEPENDENT_AMBULATORY_CARE_PROVIDER_SITE_OTHER): Payer: Medicare Other | Admitting: Family Medicine

## 2011-05-06 ENCOUNTER — Encounter: Payer: Self-pay | Admitting: Family Medicine

## 2011-05-06 VITALS — BP 138/66 | Temp 97.6°F | Ht 68.0 in | Wt 298.2 lb

## 2011-05-06 DIAGNOSIS — IMO0002 Reserved for concepts with insufficient information to code with codable children: Secondary | ICD-10-CM

## 2011-05-06 DIAGNOSIS — R059 Cough, unspecified: Secondary | ICD-10-CM | POA: Insufficient documentation

## 2011-05-06 DIAGNOSIS — R05 Cough: Secondary | ICD-10-CM

## 2011-05-06 DIAGNOSIS — Z7901 Long term (current) use of anticoagulants: Secondary | ICD-10-CM

## 2011-05-06 DIAGNOSIS — S98139A Complete traumatic amputation of one unspecified lesser toe, initial encounter: Secondary | ICD-10-CM

## 2011-05-06 DIAGNOSIS — Z86718 Personal history of other venous thrombosis and embolism: Secondary | ICD-10-CM

## 2011-05-06 DIAGNOSIS — M7500 Adhesive capsulitis of unspecified shoulder: Secondary | ICD-10-CM

## 2011-05-06 MED ORDER — AZITHROMYCIN 500 MG PO TABS: 500.0000 mg | ORAL_TABLET | Freq: Every day | ORAL | Status: AC

## 2011-05-06 MED ORDER — ACETAMINOPHEN-CODEINE #3 300-30 MG PO TABS: 1.0000 | ORAL_TABLET | Freq: Four times a day (QID) | ORAL | Status: DC | PRN

## 2011-05-06 MED ORDER — HYDROCODONE-HOMATROPINE 5-1.5 MG/5ML PO SYRP: 5.0000 mL | ORAL_SOLUTION | Freq: Three times a day (TID) | ORAL | Status: AC | PRN

## 2011-05-06 NOTE — Assessment & Plan Note (Signed)
Patient doing much improved. Recommended to continue physical therapy. Very pleased as is [name] with how his course is progressing.

## 2011-05-06 NOTE — Assessment & Plan Note (Signed)
Plan the treatment Hycodan. We'll also treat with azithromycin. Will treat this as somewhat COPD exacerbation now but would rather hold off on steroids due to his diabetes at this time. That is why we are using five-day course of azithromycin rather than three-day course.[name]

## 2011-05-06 NOTE — Patient Instructions (Addendum)
Use the cough syrup up to 3 times a day as needed. Use the Tylenol with codeine for pain relief. Take antibiotics once a day for the next 5 days. Be careful not to mix the Tylenol #3 and the cough syrup.

## 2011-05-06 NOTE — Assessment & Plan Note (Signed)
Refilled Tylenol with codeine. This helps him complete his physical therapy.

## 2011-05-06 NOTE — Progress Notes (Signed)
  Subjective:    Patient ID: [name], male    DOB: [birth year], 71 y.o.   MRN: [id number]  HPI #1. Cough: Patient has had URI last week. He describes nasal congestion, nasal stuffiness, cough. He feels this is gradually worsening. His wife has had similar symptoms. He states is now settling in his chest. He tried Tussinex over-the-counter without much relief. He states he is up at night to 4 hours at night coughing. He is using his albuterol about the same as before which is once every day or so. No fevers or chills.  #2. Followup foot amputation: Patient is doing much better ambulating. He is now wearing diabetic shoes and is ambulating without a walker or any sort of assistance device at all. He is very excited about this. He states he is still taking his diabetes medication as prescribed. No dizziness or falls.    Review of Systems See HPI above for review of systems.       Objective:   Physical Exam Gen:  Alert, cooperative patient who appears stated age in no acute distress.  Vital signs reviewed. Cardiac:  Regular rate and rhythm without murmur auscultated.  Good S1/S2. Pulm:  Clear to auscultation bilaterally with good air movement.  No wheezes or rales noted.   Ext:  Left foot s/p amputation.  Healing well.   Neuro:  Ambulating normally without walker or other assistive device.       Assessment & Plan:

## 2011-05-07 ENCOUNTER — Ambulatory Visit: Payer: Medicare Other

## 2011-05-20 ENCOUNTER — Ambulatory Visit: Payer: Medicare Other

## 2011-05-21 ENCOUNTER — Ambulatory Visit (INDEPENDENT_AMBULATORY_CARE_PROVIDER_SITE_OTHER): Payer: Medicare Other | Admitting: *Deleted

## 2011-05-21 DIAGNOSIS — Z86718 Personal history of other venous thrombosis and embolism: Secondary | ICD-10-CM

## 2011-05-21 DIAGNOSIS — Z7901 Long term (current) use of anticoagulants: Secondary | ICD-10-CM

## 2011-05-23 ENCOUNTER — Other Ambulatory Visit: Payer: Self-pay

## 2011-05-23 ENCOUNTER — Emergency Department (HOSPITAL_COMMUNITY): Payer: Medicare Other

## 2011-05-23 ENCOUNTER — Encounter (HOSPITAL_COMMUNITY): Payer: Self-pay | Admitting: *Deleted

## 2011-05-23 ENCOUNTER — Observation Stay (HOSPITAL_COMMUNITY)
Admission: EM | Admit: 2011-05-23 | Discharge: 2011-05-25 | Disposition: A | Discharge status: Home Health | Payer: Medicare Other | Attending: Family Medicine | Admitting: Family Medicine

## 2011-05-23 DIAGNOSIS — R0609 Other forms of dyspnea: Principal | ICD-10-CM | POA: Insufficient documentation

## 2011-05-23 DIAGNOSIS — E785 Hyperlipidemia, unspecified: Secondary | ICD-10-CM | POA: Insufficient documentation

## 2011-05-23 DIAGNOSIS — J189 Pneumonia, unspecified organism: Secondary | ICD-10-CM | POA: Insufficient documentation

## 2011-05-23 DIAGNOSIS — Z794 Long term (current) use of insulin: Secondary | ICD-10-CM | POA: Insufficient documentation

## 2011-05-23 DIAGNOSIS — E119 Type 2 diabetes mellitus without complications: Secondary | ICD-10-CM | POA: Insufficient documentation

## 2011-05-23 DIAGNOSIS — R0989 Other specified symptoms and signs involving the circulatory and respiratory systems: Principal | ICD-10-CM | POA: Insufficient documentation

## 2011-05-23 DIAGNOSIS — I959 Hypotension, unspecified: Secondary | ICD-10-CM | POA: Insufficient documentation

## 2011-05-23 DIAGNOSIS — Z23 Encounter for immunization: Secondary | ICD-10-CM | POA: Insufficient documentation

## 2011-05-23 DIAGNOSIS — Z86711 Personal history of pulmonary embolism: Secondary | ICD-10-CM | POA: Insufficient documentation

## 2011-05-23 DIAGNOSIS — I509 Heart failure, unspecified: Secondary | ICD-10-CM | POA: Insufficient documentation

## 2011-05-23 DIAGNOSIS — F411 Generalized anxiety disorder: Secondary | ICD-10-CM | POA: Insufficient documentation

## 2011-05-23 DIAGNOSIS — I5022 Chronic systolic (congestive) heart failure: Secondary | ICD-10-CM | POA: Insufficient documentation

## 2011-05-23 DIAGNOSIS — Z7901 Long term (current) use of anticoagulants: Secondary | ICD-10-CM | POA: Insufficient documentation

## 2011-05-23 DIAGNOSIS — J4489 Other specified chronic obstructive pulmonary disease: Secondary | ICD-10-CM | POA: Insufficient documentation

## 2011-05-23 DIAGNOSIS — J449 Chronic obstructive pulmonary disease, unspecified: Secondary | ICD-10-CM

## 2011-05-23 LAB — CARDIAC PANEL(CRET KIN+CKTOT+MB+TROPI)
CK, MB: 3.3 ng/mL (ref 0.3–4.0)
Relative Index: 2.6 — ABNORMAL HIGH (ref 0.0–2.5)
Total CK: 129 U/L (ref 7–232)
Troponin I: <0.3 ng/mL (ref <0.30)

## 2011-05-23 LAB — CBC
HCT: 35.3 % — ABNORMAL LOW (ref 39.0–52.0)
MCHC: 31.7 g/dL (ref 30.0–36.0)
MCV: 79.1 fL (ref 78.0–100.0)
MCV: 79.3 fL (ref 78.0–100.0)
Platelets: 173 10*3/uL (ref 150–400)
Platelets: 172 10*3/uL (ref 150–400)
RBC: 4.59 MIL/uL (ref 4.22–5.81)
RDW: 18.9 % — ABNORMAL HIGH (ref 11.5–15.5)
WBC: 8.2 10*3/uL (ref 4.0–10.5)

## 2011-05-23 LAB — COMPREHENSIVE METABOLIC PANEL
Albumin: 3.4 g/dL — ABNORMAL LOW (ref 3.5–5.2)
BUN: 13 mg/dL (ref 6–23)
Calcium: 9.4 mg/dL (ref 8.4–10.5)
Creatinine, Ser: 0.99 mg/dL (ref 0.50–1.35)
Total Bilirubin: 0.6 mg/dL (ref 0.3–1.2)
Total Protein: 7.4 g/dL (ref 6.0–8.3)

## 2011-05-23 LAB — POCT I-STAT 3, VENOUS BLOOD GAS (G3P V)
Bicarbonate: 21.6 mEq/L (ref 20.0–24.0)
O2 Saturation: 96 %
TCO2: 22 mmol/L (ref 0–100)
pH, Ven: 7.507 — ABNORMAL HIGH (ref 7.250–7.300)

## 2011-05-23 LAB — LIPASE, BLOOD: Lipase: 35 U/L (ref 11–59)

## 2011-05-23 LAB — BASIC METABOLIC PANEL
BUN: 17 mg/dL (ref 6–23)
Calcium: 9 mg/dL (ref 8.4–10.5)
GFR calc non Af Amer: 67 mL/min — ABNORMAL LOW (ref >90)
Glucose, Bld: 208 mg/dL — ABNORMAL HIGH (ref 70–99)

## 2011-05-23 LAB — DIFFERENTIAL
Basophils Absolute: 0 10*3/uL (ref 0.0–0.1)
Basophils Relative: 0 % (ref 0–1)
Eosinophils Absolute: 0 10*3/uL (ref 0.0–0.7)
Lymphocytes Relative: 11 % — ABNORMAL LOW (ref 12–46)
Lymphs Abs: 0.9 10*3/uL (ref 0.7–4.0)
Neutro Abs: 6.7 10*3/uL (ref 1.7–7.7)
Neutrophils Relative %: 81 % — ABNORMAL HIGH (ref 43–77)

## 2011-05-23 LAB — LACTIC ACID, PLASMA: Lactic Acid, Venous: 1.7 mmol/L (ref 0.5–2.2)

## 2011-05-23 MED ORDER — ENOXAPARIN SODIUM 150 MG/ML ~~LOC~~ SOLN
135.0000 mg | SUBCUTANEOUS | Status: AC
  Administered 2011-05-23: 135 mg via SUBCUTANEOUS
  Filled 2011-05-23: qty 1

## 2011-05-23 MED ORDER — PREDNISONE 50 MG PO TABS
60.0000 mg | ORAL_TABLET | Freq: Every day | ORAL | Status: DC
  Administered 2011-05-24 – 2011-05-25 (×2): 60 mg via ORAL
  Filled 2011-05-23 (×4): qty 1

## 2011-05-23 MED ORDER — FUROSEMIDE 10 MG/ML IJ SOLN: 80.0000 mg | Freq: Four times a day (QID) | INTRAMUSCULAR | Status: DC

## 2011-05-23 MED ORDER — ALBUTEROL SULFATE (5 MG/ML) 0.5% IN NEBU
2.5000 mg | INHALATION_SOLUTION | RESPIRATORY_TRACT | Status: DC
  Administered 2011-05-24 – 2011-05-25 (×9): 2.5 mg via RESPIRATORY_TRACT
  Filled 2011-05-23 (×9): qty 0.5

## 2011-05-23 MED ORDER — DEXTROSE 5 % IV SOLN
1.0000 g | INTRAVENOUS | Status: DC
  Administered 2011-05-23 – 2011-05-24 (×2): 1 g via INTRAVENOUS
  Filled 2011-05-23 (×3): qty 10

## 2011-05-23 MED ORDER — IPRATROPIUM BROMIDE 0.02 % IN SOLN
0.5000 mg | RESPIRATORY_TRACT | Status: DC
  Administered 2011-05-24 – 2011-05-25 (×9): 0.5 mg via RESPIRATORY_TRACT
  Filled 2011-05-23 (×9): qty 2.5

## 2011-05-23 MED ORDER — TIOTROPIUM BROMIDE MONOHYDRATE 18 MCG IN CAPS
18.0000 ug | ORAL_CAPSULE | Freq: Every day | RESPIRATORY_TRACT | Status: DC
  Filled 2011-05-23: qty 5

## 2011-05-23 MED ORDER — IPRATROPIUM BROMIDE 0.02 % IN SOLN
0.5000 mg | Freq: Once | RESPIRATORY_TRACT | Status: AC
  Administered 2011-05-23: 0.5 mg via RESPIRATORY_TRACT
  Filled 2011-05-23: qty 2.5

## 2011-05-23 MED ORDER — INSULIN NPH (HUMAN) (ISOPHANE) 100 UNIT/ML ~~LOC~~ SUSP
70.0000 [IU] | Freq: Two times a day (BID) | SUBCUTANEOUS | Status: DC
  Administered 2011-05-24 – 2011-05-25 (×3): 70 [IU] via SUBCUTANEOUS
  Filled 2011-05-23: qty 10

## 2011-05-23 MED ORDER — ALBUTEROL SULFATE (5 MG/ML) 0.5% IN NEBU
2.5000 mg | INHALATION_SOLUTION | Freq: Once | RESPIRATORY_TRACT | Status: AC
  Administered 2011-05-23: 2.5 mg via RESPIRATORY_TRACT
  Filled 2011-05-23: qty 0.5

## 2011-05-23 MED ORDER — INSULIN ASPART 100 UNIT/ML ~~LOC~~ SOLN
0.0000 [IU] | Freq: Three times a day (TID) | SUBCUTANEOUS | Status: DC
  Administered 2011-05-24: 15 [IU] via SUBCUTANEOUS
  Administered 2011-05-24: 4 [IU] via SUBCUTANEOUS
  Administered 2011-05-24: 3 [IU] via SUBCUTANEOUS
  Administered 2011-05-25 (×2): 4 [IU] via SUBCUTANEOUS
  Filled 2011-05-23: qty 3

## 2011-05-23 MED ORDER — FUROSEMIDE 10 MG/ML IJ SOLN
80.0000 mg | Freq: Four times a day (QID) | INTRAMUSCULAR | Status: AC
  Administered 2011-05-23 – 2011-05-24 (×2): 80 mg via INTRAVENOUS
  Filled 2011-05-23 (×2): qty 8

## 2011-05-23 MED ORDER — ACETAMINOPHEN-CODEINE #3 300-30 MG PO TABS
1.0000 | ORAL_TABLET | Freq: Four times a day (QID) | ORAL | Status: DC | PRN
Start: 2011-05-23 — End: 2011-05-25
  Administered 2011-05-24: 1 via ORAL
  Filled 2011-05-23: qty 1

## 2011-05-23 MED ORDER — VENLAFAXINE HCL ER 37.5 MG PO CP24
37.5000 mg | ORAL_CAPSULE | Freq: Every day | ORAL | Status: DC
  Administered 2011-05-24 – 2011-05-25 (×2): 37.5 mg via ORAL
  Filled 2011-05-23 (×2): qty 1

## 2011-05-23 MED ORDER — ASPIRIN 81 MG PO CHEW
81.0000 mg | CHEWABLE_TABLET | Freq: Every day | ORAL | Status: DC
  Administered 2011-05-24 – 2011-05-25 (×2): 81 mg via ORAL
  Filled 2011-05-23 (×2): qty 1

## 2011-05-23 MED ORDER — TAMSULOSIN HCL 0.4 MG PO CAPS
0.4000 mg | ORAL_CAPSULE | Freq: Every day | ORAL | Status: DC
  Administered 2011-05-24 – 2011-05-25 (×2): 0.4 mg via ORAL
  Filled 2011-05-23 (×3): qty 1

## 2011-05-23 MED ORDER — METHYLPREDNISOLONE SODIUM SUCC 125 MG IJ SOLR
125.0000 mg | Freq: Once | INTRAMUSCULAR | Status: AC
  Administered 2011-05-23: 125 mg via INTRAVENOUS
  Filled 2011-05-23: qty 2

## 2011-05-23 MED ORDER — ATORVASTATIN CALCIUM 40 MG PO TABS
40.0000 mg | ORAL_TABLET | Freq: Every day | ORAL | Status: DC
  Administered 2011-05-24: 40 mg via ORAL
  Filled 2011-05-23 (×2): qty 1

## 2011-05-23 MED ORDER — SODIUM CHLORIDE 0.9 % IV SOLN
Freq: Once | INTRAVENOUS | Status: AC
  Administered 2011-05-23: 15:00:00 via INTRAVENOUS

## 2011-05-23 MED ORDER — SPIRONOLACTONE 12.5 MG HALF TABLET
12.5000 mg | ORAL_TABLET | Freq: Every day | ORAL | Status: DC
  Administered 2011-05-24 – 2011-05-25 (×2): 12.5 mg via ORAL
  Filled 2011-05-23 (×2): qty 1

## 2011-05-23 MED ORDER — ALBUTEROL SULFATE (5 MG/ML) 0.5% IN NEBU: 2.5000 mg | INHALATION_SOLUTION | RESPIRATORY_TRACT | Status: DC

## 2011-05-23 MED ORDER — ENOXAPARIN SODIUM 150 MG/ML ~~LOC~~ SOLN
135.0000 mg | Freq: Two times a day (BID) | SUBCUTANEOUS | Status: DC
  Administered 2011-05-24: 09:00:00 via SUBCUTANEOUS
  Administered 2011-05-24: 135 mg via SUBCUTANEOUS
  Filled 2011-05-23 (×5): qty 1

## 2011-05-23 MED ORDER — SODIUM CHLORIDE 0.9 % IV SOLN: 250.0000 mL | INTRAVENOUS | Status: DC | PRN

## 2011-05-23 MED ORDER — SODIUM CHLORIDE 0.9 % IV BOLUS (SEPSIS)
500.0000 mL | Freq: Once | INTRAVENOUS | Status: AC
  Administered 2011-05-23: 500 mL via INTRAVENOUS

## 2011-05-23 MED ORDER — MOXIFLOXACIN HCL IN NACL 400 MG/250ML IV SOLN
400.0000 mg | Freq: Once | INTRAVENOUS | Status: AC
  Administered 2011-05-23: 400 mg via INTRAVENOUS
  Filled 2011-05-23: qty 250

## 2011-05-23 MED ORDER — DOXYCYCLINE HYCLATE 100 MG PO TABS
100.0000 mg | ORAL_TABLET | Freq: Two times a day (BID) | ORAL | Status: DC
  Administered 2011-05-23 – 2011-05-25 (×4): 100 mg via ORAL
  Filled 2011-05-23 (×6): qty 1

## 2011-05-23 MED ORDER — IPRATROPIUM BROMIDE 0.02 % IN SOLN: 0.5000 mg | RESPIRATORY_TRACT | Status: DC

## 2011-05-23 MED ORDER — WARFARIN SODIUM 2.5 MG PO TABS
2.5000 mg | ORAL_TABLET | ORAL | Status: AC
  Administered 2011-05-23: 2.5 mg via ORAL
  Filled 2011-05-23: qty 1

## 2011-05-23 MED ORDER — ALBUTEROL SULFATE (5 MG/ML) 0.5% IN NEBU
5.0000 mg | INHALATION_SOLUTION | Freq: Once | RESPIRATORY_TRACT | Status: AC
  Administered 2011-05-23: 5 mg via RESPIRATORY_TRACT
  Filled 2011-05-23: qty 1

## 2011-05-23 MED ORDER — SODIUM CHLORIDE 0.9 % IJ SOLN
3.0000 mL | Freq: Two times a day (BID) | INTRAMUSCULAR | Status: DC
  Administered 2011-05-23 – 2011-05-25 (×4): 3 mL via INTRAVENOUS

## 2011-05-23 MED ORDER — SODIUM CHLORIDE 0.9 % IJ SOLN: 3.0000 mL | INTRAMUSCULAR | Status: DC | PRN

## 2011-05-23 NOTE — ED Notes (Signed)
Report called to [facility], RN on [phone]. Patient being placed on zoll and being transported to 6737.

## 2011-05-23 NOTE — ED Provider Notes (Signed)
Medical screening examination/treatment/procedure(s) were conducted as a shared visit with non-physician practitioner(s) and myself.  I personally evaluated the patient during the encounter  He has generalized wheeze. He appears uncomfortable. He has had periods of tension in the emergency department. He'll need to be admitted for stabilization treatment for pneumonia and control of respiratory distress. Doubt sepsis as his fluid responsive.  Flint Melter, MD 05/23/11 [phone]

## 2011-05-23 NOTE — ED Notes (Signed)
Emailed pharmacy for lovenox and coumadin.

## 2011-05-23 NOTE — ED Notes (Signed)
[id number] Ready 

## 2011-05-23 NOTE — H&P (Signed)
[name] is an 71 y.o. male.   Chief Complaint: difficulty breathing HPI: This is a 71 year old Caucasian male with a history of obesity, COPD on 1.5L [place] at home, history of PE on warfarin (INR 1.6 on admission), CHF (EF 45-50% on ECHO 11/2009), CAD s/p CABG, diabetes (HgbA1c 9.9 02/2011) presenting with acute shortness-of-breath. He has had URI symptoms (cough, congestion, shortness-of-breath) for the past 3 weeks. His PCP saw him on 02/12, diagnosed with a COPD exacerbation and asked to take a 5-day course of azithromycin. Steroids were held due to his his poorly controlled diabetes.  His dypsnea became acutely worse today, and he came to the ED. He presented with increased work-of-breathing and wheezing on initial presentation. He received 2 Duoneb treatment and a dose of Solu-Medrol. FMTS was asked to evaluate him due to his persistent respiratory symptoms. When we saw him, he was sating in the 90s on 1.5L [place] and reported subjective improvement in his breathing, however, he was still significantly short-of-breath.    His other symptoms include a cough, sometimes productive of whitish sputum.  He denies fevers at home.  He has been using his breathing treatments about twice a day. He reports compliance with Spiriva. He does not take an inhaled glucocorticoid due to cost.   Past Medical History  Diagnosis Date  . Depression   . Diabetes mellitus   . COPD (chronic obstructive pulmonary disease)   . Adhesive capsulitis   . MRSA bacteremia     2011 - possible endocarditis, received 6 weeks IV treatment  . Hyperlipidemia   . Anxiety   . GERD (gastroesophageal reflux disease)   . CAD (coronary artery disease)   . 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 CVA (cerebral infarction) Questionable history  . Diverticulosis   . Osteomyelitis 2012    Sternoclavicular joint   . Chronic back pain   . Angina   . Myocardial infarction   . CHF (congestive heart failure)     . Shortness of breath   . Sleep apnea   . Chronic kidney disease     hx of BPH  . Neuromuscular disorder     HX of diabetic periferal neuropathy  . Hypertension     Past Surgical History  Procedure Date  . Septic arthritis     Removal of infected CABG wire by Dr Edwyna Shell - 2011  . Coronary artery bypass graft 1992  . Fracture surgery 1980s    Hip  . Spine surgery 2002    Cervical fusion vertebroplasty C3-4-5  . Coronary stent placement 1999, 2002  . Doppler echocardiography Sept 2011    EF 50-55% with some impaired diastolic relaxation  . Colonoscopy 2006     Multiple polyps removed, repeat in 5 years  . Right iliopopliteal bypass - 1983 1983  . Femoral-popliteal bypass graft 2008    Left  . Cardiac catheterization October 18, 2010    Known obstructive disease, no further blockages  . Cardiac catheterization 03/17/2005    EF 35-40%  . US echocardiography 03/04/2006    EF 50-55%  . Amputation 03/16/2011    Procedure: AMPUTATION DIGIT;  Surgeon: Chuck Hint, MD;  Location: [facility] OR;  Service: Vascular;  Laterality: Left;  Great toe    Family History  Problem Relation Age of Onset  . Heart disease Father    Social History:  reports that he quit smoking about 10 years ago. He has never used smokeless tobacco. He  reports that he does not drink alcohol or use illicit drugs.  Allergies:  Allergies  Allergen Reactions  . Diazepam     REACTION: unspecified    Medications Prior to Admission  Medication Dose Route Frequency Provider Last Rate Last Dose  . 0.9 %  sodium chloride infusion   Intravenous Once Shaaron Adler, PA-C 100 mL/hr at 05/23/11 1527    . 0.9 %  sodium chloride infusion  250 mL Intravenous PRN Lucianne Muss Park, MD      . acetaminophen-codeine (TYLENOL #3) 300-30 MG per tablet 1 tablet  1 tablet Oral Q6H PRN Lucianne Muss Park, MD      . albuterol (PROVENTIL) (5 MG/ML) 0.5% nebulizer solution 2.5 mg  2.5 mg Nebulization Once Glynn Octave, MD    2.5 mg at 05/23/11 1316  . ipratropium (ATROVENT) nebulizer solution 0.5 mg  0.5 mg Nebulization Q4H Lucianne Muss Park, MD       And  . albuterol (PROVENTIL) (5 MG/ML) 0.5% nebulizer solution 2.5 mg  2.5 mg Nebulization Q4H Lucianne Muss Park, MD      . albuterol (PROVENTIL) (5 MG/ML) 0.5% nebulizer solution 5 mg  5 mg Nebulization Once Shaaron Adler, PA-C   5 mg at 05/23/11 1505  . aspirin chewable tablet 81 mg  81 mg Oral Daily Lucianne Muss Park, MD      . atorvastatin (LIPITOR) tablet 40 mg  40 mg Oral q1800 Lucianne Muss Park, MD      . cefTRIAXone (ROCEPHIN) 1 g in dextrose 5 % 50 mL IVPB  1 g Intravenous Q24H Lucianne Muss Park, MD      . doxycycline (VIBRA-TABS) tablet 100 mg  100 mg Oral Q12H Lucianne Muss Park, MD      . enoxaparin (LOVENOX) injection 135 mg  135 mg Subcutaneous To Major Hilario Quarry Amend, PHARMD   135 mg at 05/23/11 1915  . enoxaparin (LOVENOX) injection 135 mg  135 mg Subcutaneous Q12H Hilario Quarry Amend, PHARMD      . furosemide (LASIX) injection 80 mg  80 mg Intravenous Q6H Lucianne Muss Park, MD      . insulin aspart (novoLOG) injection 0-20 Units  0-20 Units Subcutaneous TID WC Lucianne Muss Park, MD      . insulin NPH (HUMULIN N,NOVOLIN N) injection 70 Units  70 Units Subcutaneous BID WC Lucianne Muss Park, MD      . ipratropium (ATROVENT) nebulizer solution 0.5 mg  0.5 mg Nebulization Once Glynn Octave, MD   0.5 mg at 05/23/11 1316  . ipratropium (ATROVENT) nebulizer solution 0.5 mg  0.5 mg Nebulization Once Shaaron Adler, PA-C   0.5 mg at 05/23/11 1505  . methylPREDNISolone sodium succinate (SOLU-MEDROL) 125 MG injection 125 mg  125 mg Intravenous Once Glynn Octave, MD   125 mg at 05/23/11 1335  . moxifloxacin (AVELOX) IVPB 400 mg  400 mg Intravenous Once Glynn Octave, MD   400 mg at 05/23/11 1339  . predniSONE (DELTASONE) tablet 60 mg  60 mg Oral Q breakfast Lucianne Muss Park, MD      . sodium chloride 0.9 % bolus 500 mL  500 mL Intravenous Once Glynn Octave, MD    500 mL at 05/23/11 1339  . sodium chloride 0.9 % injection 3 mL  3 mL Intravenous Q12H Lucianne Muss Park, MD      . sodium chloride 0.9 % injection 3 mL  3 mL Intravenous PRN Priscella Mann, MD      . spironolactone (ALDACTONE)  tablet 12.5 mg  12.5 mg Oral Daily Lucianne Muss Park, MD      . Tamsulosin HCl [facility]) capsule 0.4 mg  0.4 mg Oral QPC breakfast Lucianne Muss Park, MD      . tiotropium [facility]) inhalation capsule 18 mcg  18 mcg Inhalation Daily Lucianne Muss Park, MD      . venlafaxine [facility]) 24 hr capsule 37.5 mg  37.5 mg Oral Daily Lucianne Muss Park, MD      . warfarin (COUMADIN) tablet 2.5 mg  2.5 mg Oral To Major Hilario Quarry Amend, PHARMD   2.5 mg at 05/23/11 1916  . DISCONTD: albuterol (PROVENTIL) (5 MG/ML) 0.5% nebulizer solution 2.5 mg  2.5 mg Nebulization Q4H Lucianne Muss Park, MD      . DISCONTD: furosemide (LASIX) injection 80 mg  80 mg Intravenous Q6H Lucianne Muss Park, MD      . DISCONTD: ipratropium (ATROVENT) nebulizer solution 0.5 mg  0.5 mg Nebulization Q4H Priscella Mann, MD       Medications Prior to Admission  Medication Sig Dispense Refill  . albuterol (PROVENTIL HFA;VENTOLIN HFA) 108 (90 BASE) MCG/ACT inhaler Inhale 2 puffs into the lungs every 4 (four) hours as needed for wheezing.  1 Inhaler  1  . ALPRAZolam (XANAX) 0.5 MG tablet Take 0.5 mg by mouth daily as needed. For anxiety.      Marland Kitchen aspirin (BABY ASPIRIN) 81 MG chewable tablet Chew 81 mg by mouth daily.       . bisoprolol (ZEBETA) 5 MG tablet Take 1 tablet (5 mg total) by mouth 2 (two) times daily.  90 tablet  2  . furosemide (LASIX) 40 MG tablet Take 1 tablet (40 mg total) by mouth 2 (two) times daily.  60 tablet  1  . insulin NPH (HUMULIN N) 100 UNIT/ML injection Inject 70 Units into the skin 2 (two) times daily.  10 mL  0  . lisinopril (PRINIVIL,ZESTRIL) 10 MG tablet Take 1 tablet (10 mg total) by mouth daily.  90 tablet  2  . nitroGLYCERIN (NITROSTAT) 0.4 MG SL tablet Place 1 tablet (0.4 mg total) under the tongue as  needed. For chest pains  30 tablet  1  . spironolactone (ALDACTONE) 25 MG tablet Take 0.5 tablets (12.5 mg total) by mouth daily.  60 tablet  2  . Tamsulosin HCl (FLOMAX) 0.4 MG CAPS Take 1 capsule (0.4 mg total) by mouth daily after breakfast.  90 capsule  2  . tiotropium (SPIRIVA) 18 MCG inhalation capsule Place 1 capsule (18 mcg total) into inhaler and inhale daily.  30 capsule  3  . simvastatin (ZOCOR) 80 MG tablet Take 1 tablet (80 mg total) by mouth at bedtime.  90 tablet  2    Results for orders placed during the hospital encounter of 05/23/11 (from the past 48 hour(s))  CBC     Status: Abnormal   Collection Time   05/23/11  1:15 PM      Component Value Range Comment   WBC 8.2  4.0 - 10.5 (K/uL)    RBC 4.59  4.22 - 5.81 (MIL/uL)    Hemoglobin 11.8 (*) 13.0 - 17.0 (g/dL)    HCT 21.3 (*) 08.6 - 52.0 (%)    MCV 79.1  78.0 - 100.0 (fL)    MCH 25.7 (*) 26.0 - 34.0 (pg)    MCHC 32.5  30.0 - 36.0 (g/dL)    RDW 57.8 (*) 46.9 - 15.5 (%)    Platelets 173  150 - 400 (  K/uL)   DIFFERENTIAL     Status: Abnormal   Collection Time   05/23/11  1:15 PM      Component Value Range Comment   Neutrophils Relative 81 (*) 43 - 77 (%)    Neutro Abs 6.7  1.7 - 7.7 (K/uL)    Lymphocytes Relative 11 (*) 12 - 46 (%)    Lymphs Abs 0.9  0.7 - 4.0 (K/uL)    Monocytes Relative 8  3 - 12 (%)    Monocytes Absolute 0.6  0.1 - 1.0 (K/uL)    Eosinophils Relative 0  0 - 5 (%)    Eosinophils Absolute 0.0  0.0 - 0.7 (K/uL)    Basophils Relative 0  0 - 1 (%)    Basophils Absolute 0.0  0.0 - 0.1 (K/uL)   COMPREHENSIVE METABOLIC PANEL     Status: Abnormal   Collection Time   05/23/11  1:15 PM      Component Value Range Comment   Sodium 133 (*) 135 - 145 (mEq/L)    Potassium 4.1  3.5 - 5.1 (mEq/L)    Chloride 97  96 - 112 (mEq/L)    CO2 22  19 - 32 (mEq/L)    Glucose, Bld 159 (*) 70 - 99 (mg/dL)    BUN 13  6 - 23 (mg/dL)    Creatinine, Ser 1.61  0.50 - 1.35 (mg/dL)    Calcium 9.4  8.4 - 10.5 (mg/dL)    Total  Protein 7.4  6.0 - 8.3 (g/dL)    Albumin 3.4 (*) 3.5 - 5.2 (g/dL)    AST 18  0 - 37 (U/L)    ALT 15  0 - 53 (U/L)    Alkaline Phosphatase 59  39 - 117 (U/L)    Total Bilirubin 0.6  0.3 - 1.2 (mg/dL)    GFR calc non Af Amer 80 (*) >90 (mL/min)    GFR calc Af Amer >90  >90 (mL/min)   LIPASE, BLOOD     Status: Normal   Collection Time   05/23/11  1:15 PM      Component Value Range Comment   Lipase 35  11 - 59 (U/L)   PROTIME-INR     Status: Abnormal   Collection Time   05/23/11  1:15 PM      Component Value Range Comment   Prothrombin Time 19.7 (*) 11.6 - 15.2 (seconds)    INR 1.64 (*) 0.00 - 1.49    CARDIAC PANEL(CRET KIN+CKTOT+MB+TROPI)     Status: Abnormal   Collection Time   05/23/11  1:16 PM      Component Value Range Comment   Total CK 129  7 - 232 (U/L)    CK, MB 3.3  0.3 - 4.0 (ng/mL)    Troponin I <0.30  <0.30 (ng/mL)    Relative Index 2.6 (*) 0.0 - 2.5    PRO B NATRIURETIC PEPTIDE     Status: Abnormal   Collection Time   05/23/11  1:16 PM      Component Value Range Comment   Pro B Natriuretic peptide (BNP) 1950.0 (*) 0 - 125 (pg/mL)   POCT I-STAT 3, BLOOD GAS (G3P V)     Status: Abnormal   Collection Time   05/23/11  1:27 PM      Component Value Range Comment   pH, Ven 7.507 (*) 7.250 - 7.300     pCO2, Ven 27.2 (*) 45.0 - 50.0 (mmHg)    pO2, Ven 73.0 (*) 30.0 -  45.0 (mmHg)    Bicarbonate 21.6  20.0 - 24.0 (mEq/L)    TCO2 22  0 - 100 (mmol/L)    O2 Saturation 96.0      Sample type VENOUS     LACTIC ACID, PLASMA     Status: Normal   Collection Time   05/23/11  3:25 PM      Component Value Range Comment   Lactic Acid, Venous 1.7  0.5 - 2.2 (mmol/L)   PROCALCITONIN     Status: Normal   Collection Time   05/23/11  3:25 PM      Component Value Range Comment   Procalcitonin <0.10      Dg Chest 2 View  05/23/2011  *RADIOLOGY REPORT*  Clinical Data: Shortness of breath and cough.  Prior CABG.  CHEST - 2 VIEW  Comparison: 11/30/2010  Findings: Prior median sternotomy. Midline  trachea.  Mild cardiomegaly. Mediastinal contours otherwise within normal limits. No pleural effusion or pneumothorax. Biapical pleural thickening.  Acute on chronic interstitial thickening.  Low lung volumes with left greater than right bibasilar opacity.  Increased density posteriorly on the lateral view, felt to be secondary.  IMPRESSION:  1.  Acute on chronic interstitial thickening.  Favor mild pulmonary venous congestion. 2.  Low lung volumes with left greater than right bibasilar opacity.  Favored represent volume loss/atelectasis.  Especially at the left lung base, infection is difficult to exclude.  Consider short-term radiographic follow-up.  Original Report Authenticated By: Consuello Bossier, M.D.    ROS Per HPI with inclusion of following: Gen: denies fevers/chills CV: denies chest pain/palpitations GI: denies nausea/vomiting/diarrhea/constipation  Blood pressure 90/43, pulse 75, temperature 98.2 F (36.8 C), temperature source Oral, resp. rate 19, SpO2 92.00%. Physical Exam  Gen: moderate distress due to breathing, obese HEENT: conjunctival normal; no nasal congestion; thick neck; no obvious JVD CV: RRR, no m/r/g auscultated however may be masked by loud breath sounds Pulm: increased work-of-breathing at rest; diffuse wheezing; no obvious crackles or ronchi Abd: obese, NABS, soft, NT Ext: 2+ pitting edema bilateral pre-tibia with chronic venous stasis changes Neuro: alert and oriented, appropriate to questions Psych: not agitated  Assessment/Plan This is a 71 year old Caucasian male with a history of obesity, COPD on 1.5L [place] at home, history of PE on warfarin (INR 1.6 on admission), CHF (EF 45-50% on ECHO 11/2009), CAD s/p CABG, diabetes (HgbA1c 9.9 02/2011) presenting with acute shortness-of-breath. He seems to have had URI symptoms for the past few weeks. He received a 5-day course of azithromycin the middle of February. His presentation is likely due to COPD exacerbation +/-  pneumonia. He has a questionable left lobe infiltrate on CXR. However, he is afebrile with a normal WBC.  We will admit him for observation due to his persistent dyspnea and significant co-morbidities that put him at risk for respiratory insufficiency/failure.  We will admit him to [facility]   Pulm: Acute dyspnea--2/2 COPD exacerbation, ?fluid overload, ?CAP, OSA/OHS History of COPD -Treat COPD exacerbation with Duonebs, doxycycline/CTX, steroids (received Solu-Medrol today, will start prednisone tomorrow).  -Fluid overload may be contributing. No obvious effusions on CXR but elevated pro-BNP compared to 2010 (97 at that time): on home Lasix 40 po bid>>>increase to Lasix 80 IV bid. Strict I/O, daily weights.  -Possible CAP. Questionable left lobe infiltrate on CXR. Will follow-up procalcitonin. In the meantime, will treat with doxycycline/CTX.  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 -Repeat ECHO  -Warfarin per pharmacy. Bridge with Lovenox until therapeutic.  -Home anti-hypertensives: bisoprolol 5 mg bid, isosorbide XR 15 mg qd, Lasix 40 bid, lisinopril 10 qd, spironolactone 12.5>>>hold lisinopril, isosorbide, and bisoprolol due to lower blood pressures (in clinic SBP 130-100s). See above for Lasix. Continue spironolactone.  -Continue home simvastatin and ASA  ENDO History of diabetes mellitus, poorly controlled, insulin-dependent (HgbA1c 02/2011 9.9) -Glc OK on BMET (159). Continue home insulin -SSI -Repeat HgbA1c  FEN/GI -KVO IVF -Carbohydrate-modified diet  PPx DVT: on warfarin  PSYCH History of anxiety and chronic pain -Resume home Tylenol #3, venlafaxine -Holding home Xanax  Disposition: pending  clinical improvement.   OH PARK, Symeon Puleo 05/23/2011, 9:20 PM

## 2011-05-23 NOTE — ED Notes (Signed)
Pt reports starting last night become sob. Pt wears home O2 at 1.5L. Reports cough. Pt work of breathing is elevated. Pt placed on oxygen at 2 on arrival to triage.

## 2011-05-23 NOTE — ED Notes (Signed)
Patient's wife states he has been feeling bad for a few days and last night the patient's breathing got worse and was experiencing SOB. Patient states he wears oxygen at home, 1.5L but does not know were he sats on 1.5L. Patient remains SOB and placed on 3L oxygen with sats of 94%. Patient placed on monitor and patient's wife at bedside.

## 2011-05-23 NOTE — Progress Notes (Signed)
ANTICOAGULATION CONSULT NOTE - Initial Consult  Pharmacy Consult for Coumadin  Indication: chest pain/ACS and h/o PE  Allergies  Allergen Reactions  . Diazepam     REACTION: unspecified    Patient Measurements:   Ht: 68 inches Wt: 135 kg  Vital Signs: Temp: 98.3 F (36.8 C) (03/01 1647) Temp src: Oral (03/01 1647) BP: 101/58 mmHg (03/01 1647) Pulse Rate: 79  (03/01 1647)  Labs:  Basename 05/23/11 1316 05/23/11 1315 05/21/11 0909  HGB -- 11.8* --  HCT -- 36.3* --  PLT -- 173 --  APTT -- -- --  LABPROT -- 19.7* --  INR -- 1.64* 2.7  HEPARINUNFRC -- -- --  CREATININE -- 0.99 --  CKTOTAL 129 -- --  CKMB 3.3 -- --  TROPONINI <0.30 -- --   Estimated CrCl = 68 ml/min  Medical History: Past Medical History  Diagnosis Date  . Depression   . Diabetes mellitus   . COPD (chronic obstructive pulmonary disease)   . Adhesive capsulitis   . MRSA bacteremia     2011 - possible endocarditis, received 6 weeks IV treatment  . Hyperlipidemia   . Anxiety   . GERD (gastroesophageal reflux disease)   . CAD (coronary artery disease)   . 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 CVA (cerebral infarction) Questionable history  . Diverticulosis   . Osteomyelitis 2012    Sternoclavicular joint   . Chronic back pain   . Angina   . Myocardial infarction   . CHF (congestive heart failure)   . Shortness of breath   . Sleep apnea   . Chronic kidney disease     hx of BPH  . Neuromuscular disorder     HX of diabetic periferal neuropathy  . Hypertension     Medications:  See Med Rec Coumadin 5mg  daily except for 7.5mg  on Mondays  Assessment: 71 y.o. male presents with SOB. Pt on coumadin PTA for h/o PE. Baseline INR 1.64 (subtherapeutic). Pt states that he took his dose already this morning. No bleeding noted. MD wants to bridge with lovenox until INR therapeutic.  Goal of Therapy:  INR 2-3   Plan:  1. Daily INR 2. Coumadin 2.5mg  now 3. Lovenox  135 mg SQ q12h. First dose now. 4. CBC q72h while on lovenox  [name], Hilario Quarry, PharmD, BCPS Clinical pharmacist, pager [phone] 05/23/2011,5:40 PM

## 2011-05-23 NOTE — ED Provider Notes (Signed)
MSE: hx COPD on home O2, CHF, hx PE with respiratory distress since last night.  Tachypneic, diffuse tight wheezing throughout. Hypoxic and tachypneic.  Seen by PCP Dr. Gwendolyn Grant on 2/12 for URI and given zithromax.  Dr. Patty Sermons is cardiologist.  Nebs, steroids, move to back  Glynn Octave, MD 05/23/11 1259

## 2011-05-23 NOTE — ED Provider Notes (Signed)
History     CSN: [id number]  Arrival date & time 05/23/11  1134   First MD Initiated Contact with Patient 05/23/11 1411      Chief Complaint  Patient presents with  . Shortness of Breath  . Cough    (Consider location/radiation/quality/duration/timing/severity/associated sxs/prior treatment) Patient is a 71 y.o. male presenting with shortness of breath and cough. The history is provided by the patient and the spouse.  Shortness of Breath  The current episode started yesterday. The onset was sudden. The problem occurs continuously. The problem has been unchanged. The problem is severe. The symptoms are relieved by nothing. The symptoms are aggravated by activity. Associated symptoms include orthopnea, cough, shortness of breath and wheezing. Pertinent negatives include no chest pain, no chest pressure, no fever and no sore throat. He has had intermittent steroid use. He has had prior hospitalizations.  Cough Associated symptoms include shortness of breath and wheezing. Pertinent negatives include no chest pain, no chills, no headaches and no sore throat.  Pt with hx COPD, CHF, recent bronchitis dx 2 weeks ago by PCP and tx with azithromycin presenting to ED with acutely worse SOB since last night. Has had increased cough with purulent sputum. Seen by MD in triage with initiation of orders, at time of my examination pt has had neb tx x1, solu-medrol, fluid bolus, and labs/CXR without change in symptoms.  Past Medical History  Diagnosis Date  . Depression   . Diabetes mellitus   . COPD (chronic obstructive pulmonary disease)   . Adhesive capsulitis   . MRSA bacteremia     2011 - possible endocarditis, received 6 weeks IV treatment  . Hyperlipidemia   . Anxiety   . GERD (gastroesophageal reflux disease)   . CAD (coronary artery disease)   . 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 CVA (cerebral infarction) Questionable history  . Diverticulosis   .  Osteomyelitis 2012    Sternoclavicular joint   . Chronic back pain   . Angina   . Myocardial infarction   . CHF (congestive heart failure)   . Shortness of breath   . Sleep apnea   . Chronic kidney disease     hx of BPH  . Neuromuscular disorder     HX of diabetic periferal neuropathy  . Hypertension     Past Surgical History  Procedure Date  . Septic arthritis     Removal of infected CABG wire by Dr Edwyna Shell - 2011  . Coronary artery bypass graft 1992  . Fracture surgery 1980s    Hip  . Spine surgery 2002    Cervical fusion vertebroplasty C3-4-5  . Coronary stent placement 1999, 2002  . Doppler echocardiography Sept 2011    EF 50-55% with some impaired diastolic relaxation  . Colonoscopy 2006     Multiple polyps removed, repeat in 5 years  . Right iliopopliteal bypass - 1983 1983  . Femoral-popliteal bypass graft 2008    Left  . Cardiac catheterization October 18, 2010    Known obstructive disease, no further blockages  . Cardiac catheterization 03/17/2005    EF 35-40%  . US echocardiography 03/04/2006    EF 50-55%  . 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 Heart disease Father     History  Substance Use Topics  . Smoking status: Former Smoker    Quit date: 06/19/2000  . Smokeless tobacco: Never Used  . Alcohol Use: No      Review of Systems  Constitutional: Negative for fever and chills.  HENT: Positive for congestion. Negative for sore throat, neck pain and neck stiffness.   Eyes: Negative for pain and visual disturbance.  Respiratory: Positive for cough, shortness of breath and wheezing. Negative for chest tightness.   Cardiovascular: Positive for orthopnea. Negative for chest pain. Leg swelling: chronic leg swelling, unchaged from baseline.  Gastrointestinal: Negative for nausea,  vomiting and abdominal pain.  Genitourinary: Negative for dysuria and flank pain.  Musculoskeletal: Negative for back pain.  Skin: Negative for rash and wound.  Neurological: Positive for weakness. Negative for dizziness, syncope and headaches.  Psychiatric/Behavioral: Negative for confusion.    Allergies  Diazepam  Home Medications   Current Outpatient Rx  Name Route Sig Dispense Refill  . ACETAMINOPHEN-CODEINE #3 300-30 MG PO TABS Oral Take 1 tablet by mouth every 6 (six) hours as needed. For pain.    . ALBUTEROL SULFATE HFA 108 (90 BASE) MCG/ACT IN AERS Inhalation Inhale 2 puffs into the lungs every 4 (four) hours as needed for wheezing. 1 Inhaler 1  . ALPRAZOLAM 0.5 MG PO TABS Oral Take 0.5 mg by mouth daily as needed. For anxiety.    . ASPIRIN 81 MG PO CHEW Oral Chew 81 mg by mouth daily.     Marland Kitchen BISOPROLOL FUMARATE 5 MG PO TABS Oral Take 1 tablet (5 mg total) by mouth 2 (two) times daily. 90 tablet 2  . FUROSEMIDE 40 MG PO TABS Oral Take 1 tablet (40 mg total) by mouth 2 (two) times daily. 60 tablet 1  . INSULIN ISOPHANE HUMAN 100 UNIT/ML [place] SUSP Subcutaneous Inject 70 Units into the skin 2 (two) times daily. 10 mL 0  . ISOSORBIDE MONONITRATE ER 30 MG PO TB24 Oral Take 15 mg by mouth daily.    Marland Kitchen LISINOPRIL 10 MG PO TABS Oral Take 1 tablet (10 mg total) by mouth daily. 90 tablet 2  . NITROGLYCERIN 0.4 MG SL SUBL Sublingual Place 1 tablet (0.4 mg total) under the tongue as needed. For chest pains 30 tablet 1  . SPIRONOLACTONE 25 MG PO TABS Oral Take 0.5 tablets (12.5 mg total) by mouth daily. 60 tablet 2  . TAMSULOSIN HCL 0.4 MG PO CAPS Oral Take 1 capsule (0.4 mg total) by mouth daily after breakfast. 90 capsule 2  . TIOTROPIUM BROMIDE MONOHYDRATE 18 MCG IN CAPS Inhalation Place 1 capsule (18 mcg total) into inhaler and inhale daily. 30 capsule 3  . VENLAFAXINE HCL ER 37.5 MG PO CP24 Oral Take 37.5 mg by mouth daily.    . WARFARIN SODIUM 5 MG PO TABS Oral Take 5-7.5 mg by mouth daily.  Take 5mg  every day, except take 7.5mg  on Mondays.    Marland Kitchen SIMVASTATIN 80 MG PO TABS Oral Take 1 tablet (80 mg total) by mouth at bedtime. 90 tablet 2    BP 89/39  Pulse 81  Temp(Src) 97.4 F (36.3 C) (Oral)  Resp 24  SpO2 96%  Physical Exam  Nursing note and vitals reviewed. Constitutional: He is oriented to person, place, and time. He appears distressed.       Obese, well-developed  HENT:  Head: Normocephalic and atraumatic.  Right Ear: External ear normal.  Left Ear: External ear normal.  Mouth/Throat: Oropharynx is clear and moist.  Eyes: Pupils are equal,  round, and reactive to light.  Neck: Normal range of motion. Neck supple.  Cardiovascular: Normal rate, regular rhythm and normal heart sounds.   Pulmonary/Chest: Tachypnea noted. He has decreased breath sounds. He has wheezes.       Pt with 3-5 word sentences, visibly increased WOB  Abdominal: Soft. Bowel sounds are normal. There is no tenderness.  Musculoskeletal: Edema: 2+ pitting edema to BLE.  Neurological: He is alert and oriented to person, place, and time.  Skin: Skin is warm and dry. No rash noted.  Psychiatric: He has a normal mood and affect.    ED Course  Procedures (including critical care time)  Labs Reviewed  CBC - Abnormal; Notable for the following:    Hemoglobin 11.8 (*)    HCT 36.3 (*)    MCH 25.7 (*)    RDW 18.7 (*)    All other components within normal limits  DIFFERENTIAL - Abnormal; Notable for the following:    Neutrophils Relative 81 (*)    Lymphocytes Relative 11 (*)    All other components within normal limits  COMPREHENSIVE METABOLIC PANEL - Abnormal; Notable for the following:    Sodium 133 (*)    Glucose, Bld 159 (*)    Albumin 3.4 (*)    GFR calc non Af Amer 80 (*)    All other components within normal limits  CARDIAC PANEL(CRET KIN+CKTOT+MB+TROPI) - Abnormal; Notable for the following:    Relative Index 2.6 (*)    All other components within normal limits  PRO B NATRIURETIC  PEPTIDE - Abnormal; Notable for the following:    Pro B Natriuretic peptide (BNP) 1950.0 (*)    All other components within normal limits  PROTIME-INR - Abnormal; Notable for the following:    Prothrombin Time 19.7 (*)    INR 1.64 (*)    All other components within normal limits  POCT I-STAT 3, BLOOD GAS (G3P V) - Abnormal; Notable for the following:    pH, Ven 7.507 (*)    pCO2, Ven 27.2 (*)    pO2, Ven 73.0 (*)    All other components within normal limits  LIPASE, BLOOD  LACTIC ACID, PLASMA  PROCALCITONIN   Dg Chest 2 View  05/23/2011  *RADIOLOGY REPORT*  Clinical Data: Shortness of breath and cough.  Prior CABG.  CHEST - 2 VIEW  Comparison: 11/30/2010  Findings: Prior median sternotomy. Midline trachea.  Mild cardiomegaly. Mediastinal contours otherwise within normal limits. No pleural effusion or pneumothorax. Biapical pleural thickening.  Acute on chronic interstitial thickening.  Low lung volumes with left greater than right bibasilar opacity.  Increased density posteriorly on the lateral view, felt to be secondary.  IMPRESSION:  1.  Acute on chronic interstitial thickening.  Favor mild pulmonary venous congestion. 2.  Low lung volumes with left greater than right bibasilar opacity.  Favored represent volume loss/atelectasis.  Especially at the left lung base, infection is difficult to exclude.  Consider short-term radiographic follow-up.  Original Report Authenticated By: Consuello Bossier, M.D.    Date: 05/23/2011  Rate: 71  Rhythm: sinus  QRS Axis: normal  Intervals: normal  ST/T Wave abnormalities: normal  Conduction Disutrbances:supraventricular bigeminy  Narrative Interpretation: compared to EKG dated 03/13/2011, bigeminy is new. Inferior q-waves are present on both current and prior.  Old EKG Reviewed: changes noted .  1. COPD (chronic obstructive pulmonary disease)   2. Hypotension       MDM  Increased WOB with initial hypoxia and tachypnea in pt with significant  baseline respiratory disease. Labs reviewed as above, sig for increased BNP from baseline, sub-therapeutic INR, mild alkalosis on VBG. CXR reviewed, demonstrates volume loss with possible infection in left lung base. No effusion.   Will order additional neb tx, labs to r/o sepsis given hypotension (though this is unlikely given response to fluid administration). Abx ordered by screening MD. Will likely need admission.  3:15 PM Family medicine paged for admit continued treatment of possible pneumonia vs COPD exacerbation (more likely given lack of fever or leukocytosis) as well as hypotension in this patient with multiple co-morbidities at high risk of respiratory complications.       Shaaron Adler, [place] 05/23/11 1752

## 2011-05-23 NOTE — ED Notes (Signed)
Patient remains on monitor and 2L oxygen with sats of 93%.

## 2011-05-23 NOTE — ED Provider Notes (Signed)
Medical screening examination/treatment/procedure(s) were conducted as a shared visit with non-physician practitioner(s) and myself.  I personally evaluated the patient during the encounter  Flint Melter, MD 05/23/11 [phone]

## 2011-05-23 NOTE — Progress Notes (Deleted)
[name] is an 71 y.o. male.   Chief Complaint: difficulty breathing HPI: This is a 71 year old Caucasian male with a history of obesity, COPD on 1.5L [place] at home, history of PE on warfarin (INR 1.6 on admission), CHF (EF 45-50% on ECHO 11/2009), CAD s/p CABG, diabetes (HgbA1c 9.9 02/2011) presenting with acute shortness-of-breath. He has had URI symptoms (cough, congestion, shortness-of-breath) for the past 3 weeks. His PCP saw him on 02/12, diagnosed with a COPD exacerbation and asked to take a 5-day course of azithromycin. Steroids were held due to his his poorly controlled diabetes.  His dypsnea became acutely worse today, and he came to the ED. He presented with increased work-of-breathing and wheezing on initial presentation. He received 2 Duoneb treatment and a dose of Solu-Medrol. FMTS was asked to evaluate him due to his persistent respiratory symptoms. When we saw him, he was sating in the 90s on 1.5L [place] and reported subjective improvement in his breathing, however, he was still significantly short-of-breath.    His other symptoms include a cough, sometimes productive of whitish sputum.  He denies fevers at home.  He has been using his breathing treatments about twice a day. He reports compliance with Spiriva. He does not take an inhaled glucocorticoid due to cost.   Past Medical History  Diagnosis Date  . Depression   . Diabetes mellitus   . COPD (chronic obstructive pulmonary disease)   . Adhesive capsulitis   . MRSA bacteremia     2011 - possible endocarditis, received 6 weeks IV treatment  . Hyperlipidemia   . Anxiety   . GERD (gastroesophageal reflux disease)   . CAD (coronary artery disease)   . 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 CVA (cerebral infarction) Questionable history  . Diverticulosis   . Osteomyelitis 2012    Sternoclavicular joint   . Chronic back pain   . Angina   . Myocardial infarction   . CHF (congestive heart failure)     . Shortness of breath   . Sleep apnea   . Chronic kidney disease     hx of BPH  . Neuromuscular disorder     HX of diabetic periferal neuropathy  . Hypertension     Past Surgical History  Procedure Date  . Septic arthritis     Removal of infected CABG wire by Dr Edwyna Shell - 2011  . Coronary artery bypass graft 1992  . Fracture surgery 1980s    Hip  . Spine surgery 2002    Cervical fusion vertebroplasty C3-4-5  . Coronary stent placement 1999, 2002  . Doppler echocardiography Sept 2011    EF 50-55% with some impaired diastolic relaxation  . Colonoscopy 2006     Multiple polyps removed, repeat in 5 years  . Right iliopopliteal bypass - 1983 1983  . Femoral-popliteal bypass graft 2008    Left  . Cardiac catheterization October 18, 2010    Known obstructive disease, no further blockages  . Cardiac catheterization 03/17/2005    EF 35-40%  . US echocardiography 03/04/2006    EF 50-55%  . Amputation 03/16/2011    Procedure: AMPUTATION DIGIT;  Surgeon: Chuck Hint, MD;  [facility] OR;  Service: Vascular;  Laterality: Left;  Great toe    Family History  Problem Relation Age of Onset  . Heart disease Father    Social History:  reports that he quit smoking about 10 years ago. He has never used smokeless tobacco. He  reports that he does not drink alcohol or use illicit drugs.  Allergies:  Allergies  Allergen Reactions  . Diazepam     REACTION: unspecified    Medications Prior to Admission  Medication Dose Route Frequency Provider Last Rate Last Dose  . 0.9 %  sodium chloride infusion   Intravenous Once Shaaron Adler, PA-C 100 mL/hr at 05/23/11 1527    . albuterol (PROVENTIL) (5 MG/ML) 0.5% nebulizer solution 2.5 mg  2.5 mg Nebulization Once Glynn Octave, MD   2.5 mg at 05/23/11 1316  . albuterol (PROVENTIL) (5 MG/ML) 0.5% nebulizer solution 5 mg  5 mg Nebulization Once Shaaron Adler, PA-C   5 mg at 05/23/11 1505  . ipratropium (ATROVENT)  nebulizer solution 0.5 mg  0.5 mg Nebulization Once Glynn Octave, MD   0.5 mg at 05/23/11 1316  . ipratropium (ATROVENT) nebulizer solution 0.5 mg  0.5 mg Nebulization Once Shaaron Adler, PA-C   0.5 mg at 05/23/11 1505  . methylPREDNISolone sodium succinate (SOLU-MEDROL) 125 MG injection 125 mg  125 mg Intravenous Once Glynn Octave, MD   125 mg at 05/23/11 1335  . moxifloxacin (AVELOX) IVPB 400 mg  400 mg Intravenous Once Glynn Octave, MD   400 mg at 05/23/11 1339  . sodium chloride 0.9 % bolus 500 mL  500 mL Intravenous Once Glynn Octave, MD   500 mL at 05/23/11 1339   Medications Prior to Admission  Medication Sig Dispense Refill  . albuterol (PROVENTIL HFA;VENTOLIN HFA) 108 (90 BASE) MCG/ACT inhaler Inhale 2 puffs into the lungs every 4 (four) hours as needed for wheezing.  1 Inhaler  1  . ALPRAZolam (XANAX) 0.5 MG tablet Take 0.5 mg by mouth daily as needed. For anxiety.      Marland Kitchen aspirin (BABY ASPIRIN) 81 MG chewable tablet Chew 81 mg by mouth daily.       . bisoprolol (ZEBETA) 5 MG tablet Take 1 tablet (5 mg total) by mouth 2 (two) times daily.  90 tablet  2  . furosemide (LASIX) 40 MG tablet Take 1 tablet (40 mg total) by mouth 2 (two) times daily.  60 tablet  1  . insulin NPH (HUMULIN N) 100 UNIT/ML injection Inject 70 Units into the skin 2 (two) times daily.  10 mL  0  . lisinopril (PRINIVIL,ZESTRIL) 10 MG tablet Take 1 tablet (10 mg total) by mouth daily.  90 tablet  2  . nitroGLYCERIN (NITROSTAT) 0.4 MG SL tablet Place 1 tablet (0.4 mg total) under the tongue as needed. For chest pains  30 tablet  1  . spironolactone (ALDACTONE) 25 MG tablet Take 0.5 tablets (12.5 mg total) by mouth daily.  60 tablet  2  . Tamsulosin HCl (FLOMAX) 0.4 MG CAPS Take 1 capsule (0.4 mg total) by mouth daily after breakfast.  90 capsule  2  . tiotropium (SPIRIVA) 18 MCG inhalation capsule Place 1 capsule (18 mcg total) into inhaler and inhale daily.  30 capsule  3  . simvastatin (ZOCOR) 80  MG tablet Take 1 tablet (80 mg total) by mouth at bedtime.  90 tablet  2    Results for orders placed during the hospital encounter of 05/23/11 (from the past 48 hour(s))  CBC     Status: Abnormal   Collection Time   05/23/11  1:15 PM      Component Value Range Comment   WBC 8.2  4.0 - 10.5 (K/uL)    RBC 4.59  4.22 - 5.81 (MIL/uL)  Hemoglobin 11.8 (*) 13.0 - 17.0 (g/dL)    HCT 14.7 (*) 82.9 - 52.0 (%)    MCV 79.1  78.0 - 100.0 (fL)    MCH 25.7 (*) 26.0 - 34.0 (pg)    MCHC 32.5  30.0 - 36.0 (g/dL)    RDW 56.2 (*) 13.0 - 15.5 (%)    Platelets 173  150 - 400 (K/uL)   DIFFERENTIAL     Status: Abnormal   Collection Time   05/23/11  1:15 PM      Component Value Range Comment   Neutrophils Relative 81 (*) 43 - 77 (%)    Neutro Abs 6.7  1.7 - 7.7 (K/uL)    Lymphocytes Relative 11 (*) 12 - 46 (%)    Lymphs Abs 0.9  0.7 - 4.0 (K/uL)    Monocytes Relative 8  3 - 12 (%)    Monocytes Absolute 0.6  0.1 - 1.0 (K/uL)    Eosinophils Relative 0  0 - 5 (%)    Eosinophils Absolute 0.0  0.0 - 0.7 (K/uL)    Basophils Relative 0  0 - 1 (%)    Basophils Absolute 0.0  0.0 - 0.1 (K/uL)   COMPREHENSIVE METABOLIC PANEL     Status: Abnormal   Collection Time   05/23/11  1:15 PM      Component Value Range Comment   Sodium 133 (*) 135 - 145 (mEq/L)    Potassium 4.1  3.5 - 5.1 (mEq/L)    Chloride 97  96 - 112 (mEq/L)    CO2 22  19 - 32 (mEq/L)    Glucose, Bld 159 (*) 70 - 99 (mg/dL)    BUN 13  6 - 23 (mg/dL)    Creatinine, Ser 8.65  0.50 - 1.35 (mg/dL)    Calcium 9.4  8.4 - 10.5 (mg/dL)    Total Protein 7.4  6.0 - 8.3 (g/dL)    Albumin 3.4 (*) 3.5 - 5.2 (g/dL)    AST 18  0 - 37 (U/L)    ALT 15  0 - 53 (U/L)    Alkaline Phosphatase 59  39 - 117 (U/L)    Total Bilirubin 0.6  0.3 - 1.2 (mg/dL)    GFR calc non Af Amer 80 (*) >90 (mL/min)    GFR calc Af Amer >90  >90 (mL/min)   LIPASE, BLOOD     Status: Normal   Collection Time   05/23/11  1:15 PM      Component Value Range Comment   Lipase 35  11 - 59  (U/L)   PROTIME-INR     Status: Abnormal   Collection Time   05/23/11  1:15 PM      Component Value Range Comment   Prothrombin Time 19.7 (*) 11.6 - 15.2 (seconds)    INR 1.64 (*) 0.00 - 1.49    CARDIAC PANEL(CRET KIN+CKTOT+MB+TROPI)     Status: Abnormal   Collection Time   05/23/11  1:16 PM      Component Value Range Comment   Total CK 129  7 - 232 (U/L)    CK, MB 3.3  0.3 - 4.0 (ng/mL)    Troponin I <0.30  <0.30 (ng/mL)    Relative Index 2.6 (*) 0.0 - 2.5    PRO B NATRIURETIC PEPTIDE     Status: Abnormal   Collection Time   05/23/11  1:16 PM      Component Value Range Comment   Pro B Natriuretic peptide (BNP) 1950.0 (*) 0 -  125 (pg/mL)   POCT I-STAT 3, BLOOD GAS (G3P V)     Status: Abnormal   Collection Time   05/23/11  1:27 PM      Component Value Range Comment   pH, Ven 7.507 (*) 7.250 - 7.300     pCO2, Ven 27.2 (*) 45.0 - 50.0 (mmHg)    pO2, Ven 73.0 (*) 30.0 - 45.0 (mmHg)    Bicarbonate 21.6  20.0 - 24.0 (mEq/L)    TCO2 22  0 - 100 (mmol/L)    O2 Saturation 96.0      Sample type VENOUS     LACTIC ACID, PLASMA     Status: Normal   Collection Time   05/23/11  3:25 PM      Component Value Range Comment   Lactic Acid, Venous 1.7  0.5 - 2.2 (mmol/L)    Dg Chest 2 View  05/23/2011  *RADIOLOGY REPORT*  Clinical Data: Shortness of breath and cough.  Prior CABG.  CHEST - 2 VIEW  Comparison: 11/30/2010  Findings: Prior median sternotomy. Midline trachea.  Mild cardiomegaly. Mediastinal contours otherwise within normal limits. No pleural effusion or pneumothorax. Biapical pleural thickening.  Acute on chronic interstitial thickening.  Low lung volumes with left greater than right bibasilar opacity.  Increased density posteriorly on the lateral view, felt to be secondary.  IMPRESSION:  1.  Acute on chronic interstitial thickening.  Favor mild pulmonary venous congestion. 2.  Low lung volumes with left greater than right bibasilar opacity.  Favored represent volume loss/atelectasis.  Especially  at the left lung base, infection is difficult to exclude.  Consider short-term radiographic follow-up.  Original Report Authenticated By: Consuello Bossier, M.D.    ROS  Blood pressure 101/58, pulse 79, temperature 98.3 F (36.8 C), temperature source Oral, resp. rate 19, SpO2 92.00%. Physical Exam  Gen: moderate distress due to breathing, obese HEENT: conjunctival normal; no nasal congestion; thick neck; no obvious JVD CV: RRR, no m/r/g auscultated however may be masked by loud breath sounds Pulm: increased work-of-breathing at rest; diffuse wheezing; no obvious crackles or ronchi Abd: obese, NABS, soft, NT Ext: 2+ pitting edema bilateral pre-tibia with chronic venous stasis changes Neuro: alert and oriented, appropriate to questions Psych: not agitated  Assessment/Plan This is a 71 year old Caucasian male with a history of obesity, COPD on 1.5L [place] at home, history of PE on warfarin (INR 1.6 on admission), CHF (EF 45-50% on ECHO 11/2009), CAD s/p CABG, diabetes (HgbA1c 9.9 02/2011) presenting with acute shortness-of-breath. He seems to have had URI symptoms for the past few weeks. He received a 5-day course of azithromycin the middle of February. His presentation is likely due to COPD exacerbation +/- pneumonia. He has a questionable left lobe infiltrate on CXR. However, he is afebrile with a normal WBC.  We will admit him for observation due to his persistent dyspnea and significant co-morbidities that put him at risk for respiratory insufficiency/failure.  We will admit him to [facility].   Pulm: Acute dyspnea--2/2 COPD exacerbation, ?fluid overload, ?CAP, OSA/OHS History of COPD -Treat COPD exacerbation with Duonebs, doxycycline/CTX, steroids (received Solu-Medrol today, will start prednisone tomorrow).  -Fluid overload may be contributing. No obvious effusions on CXR but elevated pro-BNP compared to 2010 (97 at that time): on home Lasix 40 po bid>>>increase to Lasix 80 IV bid. Strict I/O,  daily weights.  -Possible CAP. Questionable left lobe infiltrate on CXR. Will follow-up procalcitonin. In the meantime, will treat with doxycycline/CTX.  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 -Repeat ECHO  -Warfarin per pharmacy. Bridge with Lovenox until therapeutic.  -Home anti-hypertensives: bisoprolol 5 mg bid, isosorbide XR 15 mg qd, Lasix 40 bid, lisinopril 10 qd, spironolactone 12.5>>>hold lisinopril, isosorbide, and bisoprolol due to lower blood pressures (in clinic SBP 130-100s). See above for Lasix. Continue spironolactone.  -Continue home simvastatin and ASA  ENDO History of diabetes mellitus, poorly controlled, insulin-dependent (HgbA1c 02/2011 9.9) -Glc OK on BMET (159). Continue home insulin -SSI -Repeat HgbA1c  FEN/GI -KVO IVF -Carbohydrate-modified diet  PPx DVT: on warfarin  PSYCH History of anxiety and chronic pain -Resume home Tylenol #3, venlafaxine -Holding home Xanax  Disposition: pending clinical improvement.   [name], [name] 05/23/2011, 5:10 PM

## 2011-05-24 ENCOUNTER — Observation Stay (HOSPITAL_COMMUNITY): Payer: Medicare Other

## 2011-05-24 DIAGNOSIS — J441 Chronic obstructive pulmonary disease with (acute) exacerbation: Secondary | ICD-10-CM

## 2011-05-24 DIAGNOSIS — I5023 Acute on chronic systolic (congestive) heart failure: Secondary | ICD-10-CM

## 2011-05-24 LAB — MRSA PCR SCREENING: MRSA by PCR: NEGATIVE

## 2011-05-24 LAB — GLUCOSE, CAPILLARY

## 2011-05-24 LAB — HEMOGLOBIN A1C
Hgb A1c MFr Bld: 8.5 % — ABNORMAL HIGH (ref <5.7)
Mean Plasma Glucose: 197 mg/dL — ABNORMAL HIGH (ref <117)

## 2011-05-24 LAB — PROTIME-INR: INR: 2.03 — ABNORMAL HIGH (ref 0.00–1.49)

## 2011-05-24 MED ORDER — PNEUMOCOCCAL VAC POLYVALENT 25 MCG/0.5ML IJ INJ
0.5000 mL | INJECTION | INTRAMUSCULAR | Status: AC
  Administered 2011-05-25: 0.5 mL via INTRAMUSCULAR
  Filled 2011-05-24: qty 0.5

## 2011-05-24 MED ORDER — GUAIFENESIN-CODEINE 100-10 MG/5ML PO SOLN
5.0000 mL | Freq: Three times a day (TID) | ORAL | Status: DC | PRN
  Administered 2011-05-24 – 2011-05-25 (×3): 5 mL via ORAL
  Filled 2011-05-24 (×3): qty 5

## 2011-05-24 MED ORDER — WARFARIN SODIUM 5 MG PO TABS
5.0000 mg | ORAL_TABLET | Freq: Once | ORAL | Status: AC
  Administered 2011-05-24: 5 mg via ORAL
  Filled 2011-05-24: qty 1

## 2011-05-24 NOTE — Progress Notes (Signed)
I have seen and examined this patient. I have discussed with Dr Madolyn Frieze.  I agree with their findings and plans as documented in their progress note for today.  See my admission note filed today for details.

## 2011-05-24 NOTE — Progress Notes (Signed)
ANTICOAGULATION CONSULT NOTE - Follow Up Consult  Pharmacy Consult for Coumadin  Indication: chest pain/ACS and h/o PE  Assessment: 71 y.o. male on coumadin PTA for h/o PE. Baseline INR 1.64 (subtherapeutic), but INR today is 2.03. Home dose is 5mg  daily, except 7.5mg  on Mondays. MD wants to bridge with lovenox until INR therapeutic.  Will continue lovenox until INR tomorrow confirms that patient is therapeutic. No bleeding issues reported. Potential drug interactions include doxycycline & prednisone.   Pharmacy System-Based Medication Review: Infectious Disease: doxycycline/ceftriaxone D#2 for COPD exacerbation, afebrile, MRSA PCR neg. Cardiovascular: h/o HLD/CAD/CABG/HTN/CHF- on lasix for possible CHF exac, holding PTA bisoprolol/lisinopril due to low SBP in clinic.  Endocrinology: h/o diabetes (uncontrolled) - on home insulin regimen Neurology: h/o depression - on home venlafaxine, holding PTA xanax Nephrology: SCr & lytes ok and stable Pulmonary: h/o COPD - on oxygen/spiriva/albuterol PTA, now on scheduled Duonebs & prednisone 60mg  x5 days Hematology / Oncology: CBC ok & stable Best Practices: Lovenox/Coumadin  Goal of Therapy:  INR 2-3   Plan:  1. Coumadin 5mg  PO x1 today 2. Continue lovenox 135mg  SQ q12h until tomorrow 3. Check PT/INR in AM  Ival Bible, PharmD Clinical pharmacist, pager [phone] 05/24/2011,7:45 AM    Allergies  Allergen Reactions  . Diazepam     REACTION: unspecified    Patient Measurements: Weight: 292 lb 1.8 oz (132.5 kg) Ht: 68 inches Wt: 135 kg  Vital Signs: Temp: 97.6 F (36.4 C) (03/02 0546) Temp src: Oral (03/02 0546) BP: 123/44 mmHg (03/02 0546) Pulse Rate: 69  (03/02 0546)  Labs:  Basename 05/24/11 0500 05/23/11 2102 05/23/11 1316 05/23/11 1315 05/21/11 0909  HGB -- 11.2* -- 11.8* --  HCT -- 35.3* -- 36.3* --  PLT -- 172 -- 173 --  APTT -- -- -- -- --  LABPROT 23.3* -- -- 19.7* --  INR 2.03* -- -- 1.64* 2.7  HEPARINUNFRC  -- -- -- -- --  CREATININE -- 1.07 -- 0.99 --  CKTOTAL -- -- 129 -- --  CKMB -- -- 3.3 -- --  TROPONINI -- -- <0.30 -- --   Estimated CrCl = 68 ml/min  Medical History: Past Medical History  Diagnosis Date  . Depression   . Diabetes mellitus   . COPD (chronic obstructive pulmonary disease)   . Adhesive capsulitis   . MRSA bacteremia     2011 - possible endocarditis, received 6 weeks IV treatment  . Hyperlipidemia   . Anxiety   . GERD (gastroesophageal reflux disease)   . CAD (coronary artery disease)   . 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 CVA (cerebral infarction) Questionable history  . Diverticulosis   . Osteomyelitis 2012    Sternoclavicular joint   . Chronic back pain   . Angina   . Myocardial infarction   . CHF (congestive heart failure)   . Shortness of breath   . Sleep apnea   . Chronic kidney disease     hx of BPH  . Neuromuscular disorder     HX of diabetic periferal neuropathy  . Hypertension     Medications:  See Med Rec Coumadin 5mg  daily except for 7.5mg  on Mondays

## 2011-05-24 NOTE — Progress Notes (Signed)
I have seen and examined this patient. I have discussed with Dr Madolyn Frieze.  I agree with their findings and plans as documented in their progress note for today.  Patient improving.  Would like Physical Therapy to evaluate patient for balance and endurance. Anticipate discharge tomorrow.

## 2011-05-24 NOTE — H&P (Signed)
I have seen and examined this patient with Dr Madolyn Frieze.  I agree with their findings and plans as documented in her admission note.  Acute Issues  1. Acute Dyspnea - Pneumonia +/- presence of acute decompensated heart failure.  Plan: systemic corticosteroid, SABD, doxy/rocephin Lasix for possible volume overload.  Rule out MI

## 2011-05-24 NOTE — Progress Notes (Signed)
Family Practice Note:  Primary Care Provider Social Visit Renold Don MD Pager Number for MD on Call:  [phone]  Subjective: [name] is doing much better today. He states he still has productive cough of white sputum. However his breathing overall is much improved. He states he is worried about going home getting worse tonight.  Objective: Vital signs in last 24 hours: Temp:  [97.3 F (36.3 C)-99.2 F (37.3 C)] 97.7 F (36.5 C) (03/02 0935) Pulse Rate:  [68-99] 75  (03/02 0935) Resp:  [15-26] 18  (03/02 0935) BP: (76-123)/(32-81) 100/81 mmHg (03/02 0935) SpO2:  [91 %-100 %] 94 % (03/02 0935) FiO2 (%):  [2 %] 2 % (03/01 1647) Weight:  [292 lb 1.8 oz (132.5 kg)] 292 lb 1.8 oz (132.5 kg) (03/01 2134) Weight change:  Last BM Date: 05/22/11  Intake/Output from previous day: 03/01 0701 - 03/02 0700 In: -  Out: 432 [Urine:432] Intake/Output this shift:   Physical exam: Gen: NAD, obese CV: RRR, no m/r/g Pulm: some end expiratory wheezing, wearing [place], no crackles noted on exam. Abd: soft, NT Ext: 2+ pretibial pitting edema with chronic venous stasis changes noted Neuro: alert and oriented, appropriate to questions  Lab Results:  [facility] 05/23/11 2102 05/23/11 1315  WBC 7.1 8.2  HGB 11.2* 11.8*  HCT 35.3* 36.3*  PLT 172 173   BMET  Basename 05/23/11 2102 05/23/11 1315  NA 135 133*  K 4.5 4.1  CL 101 97  CO2 22 22  GLUCOSE 208* 159*  BUN 17 13  CREATININE 1.07 0.99  CALCIUM 9.0 9.4    Studies/Results: Dg Chest 2 View  05/24/2011  *RADIOLOGY REPORT*  Clinical Data: 71 year old male with cough.  CHEST - 2 VIEW  Comparison: 05/23/2011 and prior chest radiographs  Findings: Cardiomegaly and cardiac surgical changes are again noted. Left lower lobe opacity is again noted - question atelectasis versus airspace disease/pneumonia. Mild interstitial prominence is unchanged. No pleural effusion or pneumothorax noted. No acute bony abnormalities are identified.  IMPRESSION: Stable  chest radiograph with continued left lower lung atelectasis/airspace disease.  Original Report Authenticated By: Rosendo Gros, M.D.   Dg Chest 2 View  05/23/2011  *RADIOLOGY REPORT*  Clinical Data: Shortness of breath and cough.  Prior CABG.  CHEST - 2 VIEW  Comparison: 11/30/2010  Findings: Prior median sternotomy. Midline trachea.  Mild cardiomegaly. Mediastinal contours otherwise within normal limits. No pleural effusion or pneumothorax. Biapical pleural thickening.  Acute on chronic interstitial thickening.  Low lung volumes with left greater than right bibasilar opacity.  Increased density posteriorly on the lateral view, felt to be secondary.  IMPRESSION:  1.  Acute on chronic interstitial thickening.  Favor mild pulmonary venous congestion. 2.  Low lung volumes with left greater than right bibasilar opacity.  Favored represent volume loss/atelectasis.  Especially at the left lung base, infection is difficult to exclude.  Consider short-term radiographic follow-up.  Original Report Authenticated By: Consuello Bossier, M.D.    Assessment/Plan: [name] is a 71 year old Caucasian male with a history of obesity, COPD on 1.5L [place] at home on as-needed basis, history of PE on warfarin (INR 1.6 on admission), CHF (EF 45-50% on ECHO 11/2009), CAD s/p CABG, diabetes (HgbA1c 9.9 02/2011) presenting with acute shortness-of-breath secondary to COPD exacerbation, ?CHF exacerbation, ?CAP:  PULM: Acute dyspnea--2/2 COPD exacerbation, ?fluid overload, ?CAP, OSA/OHS  History of COPD  Respiratory status improved and now sating well on [place].  -COPD exacerbation: change Duonebs to q6, continue doxycycline, prednisone 60 mg qd  x 5 days -?CHF exacerbation: follow-up on I/O and daily weights. Received Lasix 80 IV bid overnight (home doses: Lasix 40 po bid)>>>Lasix 80 po bid today.  -?CAP. Questionable left lobe infiltrate on admission CXR>>>infiltrate seems improved but is still present. Procalcitonin negative. Afebrile. Will  follow-up procalcitonin. Doxycycline/CTX>>>continue changing to Levaquin and continuing for 5 days.  -Discuss with SW about getting him BiPAP machine for  home (unable to afford in the past)  -He is doing much better this morning. I believe he likely has pneumonia on chest x-ray. His pro BNP was elevated he has no crackles on exam. On outpatient basis there has been discussion trying to get him off of daily Lasix with a "heart failure action plan" but I worry that maybe now he is to not taking his Lasix as somewhat fluid overloaded. - Plan will be to likely keep in on him one more day to make sure he does not decompensate. He has not had trial off oxygen yet. Continue antibiotics and continue prednisone.  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  he is currently being bridged with Lovenox. Her per pharmacy recommendations his renal Lovenox until noon tomorrow. INR is therapeutic today. Noon tomorrow will be 24 hours therapeutic INR   Lab 05/24/11 0500 05/23/11 1315 05/21/11 0909  INR 2.03* 1.64* 2.7   Hypertension -Home  anti-hypertensives: bisoprolol 5 mg bid, isosorbide XR 15 mg qd, Lasix 40 bid, lisinopril 10 qd, spironolactone 12.5>>>resume lisinopril and bisoprolol. Holding isosorbide for now due to blood pressures that are okay off ACEi and beta-blocker; in clinic SBP 130-100s. See above for Lasix. Continue spironolactone.  -Continue home simvastatin and ASA   ENDO  History of diabetes mellitus, poorly controlled, insulin-dependent (HgbA1c 02/2011 9.9)  -Glc 150s, low 200s. Continue home insulin  -SSI  -Repeat HgbA1c>>>8.5. Improved from 02/2011. Follow-up as outpatient.  FEN/GI  -KVO IVF  -Carbohydrate-modified diet    PPx DVT: on warfarin, Lovenox bridge SUP: not indicated  PSYCH  History of anxiety and chronic pain  -Resume home Tylenol #3, venlafaxine  -Holding home Xanax   Disposition:  anticipate discharge home tomorrow after he is therapeutic on INR and his lungs he is still doing well with work of breathing.    LOS: 1 day   Britania Shreeve,JEFF 05/24/2011, 10:54 AM

## 2011-05-24 NOTE — Progress Notes (Signed)
Subjective: Breathing is improved. No new complaints.   Objective: Vital signs in last 24 hours: Temp:  [97.3 F (36.3 C)-99.2 F (37.3 C)] 97.6 F (36.4 C) (03/02 0546) Pulse Rate:  [68-99] 69  (03/02 0546) Resp:  [15-26] 15  (03/02 0546) BP: (76-123)/(32-65) 123/44 mmHg (03/02 0546) SpO2:  [91 %-100 %] 94 % (03/02 0546) FiO2 (%):  [2 %] 2 % (03/01 1647) Weight:  [292 lb 1.8 oz (132.5 kg)] 292 lb 1.8 oz (132.5 kg) (03/01 2134) Weight change:  Last BM Date: 05/22/11  Intake/Output from previous day: 03/01 0701 - 03/02 0700 In: -  Out: 432 [Urine:432] Intake/Output this shift:   Physical exam: Gen: NAD, obese CV: RRR, no m/r/g Pulm: some end expiratory wheezing, mild increased WOB, wearing [place], no ronchi or rales Abd: soft, NT Ext: 1-2+ pretibial pitting edema Neuro: alert and oriented, appropriate to questions  Lab Results:  [facility] 05/23/11 2102 05/23/11 1315  WBC 7.1 8.2  HGB 11.2* 11.8*  HCT 35.3* 36.3*  PLT 172 173   BMET  Basename 05/23/11 2102 05/23/11 1315  NA 135 133*  K 4.5 4.1  CL 101 97  CO2 22 22  GLUCOSE 208* 159*  BUN 17 13  CREATININE 1.07 0.99  CALCIUM 9.0 9.4    Studies/Results: Dg Chest 2 View  05/23/2011  *RADIOLOGY REPORT*  Clinical Data: Shortness of breath and cough.  Prior CABG.  CHEST - 2 VIEW  Comparison: 11/30/2010  Findings: Prior median sternotomy. Midline trachea.  Mild cardiomegaly. Mediastinal contours otherwise within normal limits. No pleural effusion or pneumothorax. Biapical pleural thickening.  Acute on chronic interstitial thickening.  Low lung volumes with left greater than right bibasilar opacity.  Increased density posteriorly on the lateral view, felt to be secondary.  IMPRESSION:  1.  Acute on chronic interstitial thickening.  Favor mild pulmonary venous congestion. 2.  Low lung volumes with left greater than right bibasilar opacity.  Favored represent volume loss/atelectasis.  Especially at the left lung base, infection  is difficult to exclude.  Consider short-term radiographic follow-up.  Original Report Authenticated By: Consuello Bossier, M.D.    Assessment/Plan: This is a 71 year old Caucasian male with a history of obesity, COPD on 1.5L [place] at home, history of PE on warfarin (INR 1.6 on admission), CHF (EF 45-50% on ECHO 11/2009), CAD s/p CABG, diabetes (HgbA1c 9.9 02/2011) presenting with acute shortness-of-breath secondary to COPD exacerbation, ?CHF exacerbation, ?CAP  PULM: Acute dyspnea--2/2 COPD exacerbation, ?fluid overload, ?CAP, OSA/OHS  History of COPD  Respiratory status improved and now sating well on [place].  -COPD exacerbation: change Duonebs to q6, continue doxycycline, prednisone 60 mg qd x 5 days -?CHF exacerbation: follow-up on I/O and daily weights. Received Lasix 80 IV bid overnight (home doses: Lasix 40 po bid)>>>Lasix 80 po bid today.  -?CAP. Questionable left lobe infiltrate on admission CXR>>>infiltrate seems improved but is still present. Procalcitonin negative. Afebrile. Will follow-up procalcitonin. Doxycycline/CTX>>>continue changing to Levaquin and continuing for 5 days.  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 Bridge with Lovenox until therapeutic. Follow-up today's INR.  Lab 05/23/11 1315 05/21/11 7846  INR 1.64* 2.7   -Home anti-hypertensives: bisoprolol 5 mg bid, isosorbide XR 15 mg qd, Lasix 40 bid, lisinopril 10 qd, spironolactone 12.5>>>resume lisinopril and bisoprolol. Holding isosorbide for now due to blood pressures that are okay off ACEi and beta-blocker; in clinic SBP 130-100s. See above for Lasix. Continue  spironolactone.  -Continue home simvastatin and ASA   ENDO  History of diabetes mellitus, poorly controlled, insulin-dependent (HgbA1c 02/2011 9.9)  -Glc 150s, low 200s. Continue home insulin  -SSI  -Repeat HgbA1c>>>8.5. Improved from 02/2011. Follow-up as outpatient.  FEN/GI  -KVO IVF  -Carbohydrate-modified diet   PPx DVT: on warfarin, Lovenox bridge SUP: not indicated  PSYCH  History of anxiety and chronic pain  -Resume home Tylenol #3, venlafaxine  -Holding home Xanax   Disposition: pending clinical improvement>>>respiratory status seems to be about back to baseline. Anticipate discharge home today. Will follow-up on INR>>>may need Rx for Lovenox until INR therapeutic on warfarin.     LOS: 1 day   [place] PARK, Aarini Slee 05/24/2011, 7:12 AM

## 2011-05-25 LAB — BASIC METABOLIC PANEL
BUN: 35 mg/dL — ABNORMAL HIGH (ref 6–23)
CO2: 24 mEq/L (ref 19–32)
Chloride: 101 mEq/L (ref 96–112)
Creatinine, Ser: 1.27 mg/dL (ref 0.50–1.35)
Glucose, Bld: 175 mg/dL — ABNORMAL HIGH (ref 70–99)
Potassium: 3.6 mEq/L (ref 3.5–5.1)

## 2011-05-25 LAB — GLUCOSE, CAPILLARY
Glucose-Capillary: 178 mg/dL — ABNORMAL HIGH (ref 70–99)
Glucose-Capillary: 171 mg/dL — ABNORMAL HIGH (ref 70–99)

## 2011-05-25 MED ORDER — WARFARIN - PHARMACIST DOSING INPATIENT
Freq: Every day | Status: DC
  Filled 2011-05-25: qty 1

## 2011-05-25 MED ORDER — FUROSEMIDE 80 MG PO TABS: 80.0000 mg | ORAL_TABLET | Freq: Two times a day (BID) | ORAL | Status: DC

## 2011-05-25 MED ORDER — LEVOFLOXACIN 750 MG PO TABS: 750.0000 mg | ORAL_TABLET | Freq: Every day | ORAL | Status: DC

## 2011-05-25 MED ORDER — LEVOFLOXACIN 750 MG PO TABS
750.0000 mg | ORAL_TABLET | Freq: Every day | ORAL | Status: DC
  Administered 2011-05-25: 750 mg via ORAL
  Filled 2011-05-25: qty 1

## 2011-05-25 MED ORDER — IPRATROPIUM BROMIDE 0.02 % IN SOLN: 0.5000 mg | Freq: Four times a day (QID) | RESPIRATORY_TRACT | Status: DC

## 2011-05-25 MED ORDER — FUROSEMIDE 80 MG PO TABS
80.0000 mg | ORAL_TABLET | Freq: Two times a day (BID) | ORAL | Status: DC
  Administered 2011-05-25: 80 mg via ORAL
  Filled 2011-05-25 (×3): qty 1

## 2011-05-25 MED ORDER — ALBUTEROL SULFATE (5 MG/ML) 0.5% IN NEBU: 2.5000 mg | INHALATION_SOLUTION | RESPIRATORY_TRACT | Status: DC

## 2011-05-25 MED ORDER — ALBUTEROL SULFATE (5 MG/ML) 0.5% IN NEBU: 2.5000 mg | INHALATION_SOLUTION | Freq: Four times a day (QID) | RESPIRATORY_TRACT | Status: DC

## 2011-05-25 MED ORDER — PREDNISONE 20 MG PO TABS: 60.0000 mg | ORAL_TABLET | Freq: Every day | ORAL | Status: AC

## 2011-05-25 NOTE — Progress Notes (Signed)
Subjective: Feeling much better today. Has been able to ambulate to the bathroom and feels like the shortness of breath has improved since his admission. He is still coughing but that has also improved.   Objective: Vital signs in last 24 hours: Temp:  [97.4 F (36.3 C)-98.3 F (36.8 C)] 97.8 F (36.6 C) (03/03 1015) Pulse Rate:  [80-93] 90  (03/03 1015) Resp:  [20] 20  (03/03 1015) BP: (104-127)/(45-71) 124/49 mmHg (03/03 1015) SpO2:  [92 %-100 %] 94 % (03/03 1015) Weight:  [289 lb (131.09 kg)] 289 lb (131.09 kg) (03/02 2057) Weight change: -3 lb 1.8 oz (-1.41 kg) Last BM Date: 05/22/11  Intake/Output from previous day: 03/02 0701 - 03/03 0700 In: 1497 [P.O.:1397; IV Piggyback:100] Out: -  Intake/Output this shift: Total I/O In: 360 [P.O.:360] Out: -  Physical exam: Gen: NAD, obese CV: RRR, no m/r/g Pulm: end expiratory wheezing, scattered crackles in lower lung fields, normal work of breathing sitting with [place] in place.  Abd: soft, NT Ext: 2+ pretibial pitting edema with chronic venous stasis changes noted Neuro: alert and oriented, appropriate to questions  Lab Results:  [facility] 05/23/11 2102 05/23/11 1315  WBC 7.1 8.2  HGB 11.2* 11.8*  HCT 35.3* 36.3*  PLT 172 173   BMET  Basename 05/25/11 0625 05/23/11 2102  NA 137 135  K 3.6 4.5  CL 101 101  CO2 24 22  GLUCOSE 175* 208*  BUN 35* 17  CREATININE 1.27 1.07  CALCIUM 9.2 9.0    Studies/Results: Dg Chest 2 View  05/24/2011  *RADIOLOGY REPORT*  Clinical Data: 71 year old male with cough.  CHEST - 2 VIEW  Comparison: 05/23/2011 and prior chest radiographs  Findings: Cardiomegaly and cardiac surgical changes are again noted. Left lower lobe opacity is again noted - question atelectasis versus airspace disease/pneumonia. Mild interstitial prominence is unchanged. No pleural effusion or pneumothorax noted. No acute bony abnormalities are identified.  IMPRESSION: Stable chest radiograph with continued left lower  lung atelectasis/airspace disease.  Original Report Authenticated By: Rosendo Gros, M.D.   Dg Chest 2 View  05/23/2011  *RADIOLOGY REPORT*  Clinical Data: Shortness of breath and cough.  Prior CABG.  CHEST - 2 VIEW  Comparison: 11/30/2010  Findings: Prior median sternotomy. Midline trachea.  Mild cardiomegaly. Mediastinal contours otherwise within normal limits. No pleural effusion or pneumothorax. Biapical pleural thickening.  Acute on chronic interstitial thickening.  Low lung volumes with left greater than right bibasilar opacity.  Increased density posteriorly on the lateral view, felt to be secondary.  IMPRESSION:  1.  Acute on chronic interstitial thickening.  Favor mild pulmonary venous congestion. 2.  Low lung volumes with left greater than right bibasilar opacity.  Favored represent volume loss/atelectasis.  Especially at the left lung base, infection is difficult to exclude.  Consider short-term radiographic follow-up.  Original Report Authenticated By: Consuello Bossier, M.D.    Assessment/Plan: Ed is a 71 year old Caucasian male with a history of obesity, COPD on 1.5L [place] at home on as-needed basis, history of PE on warfarin (INR 1.6 on admission), CHF (EF 45-50% on ECHO 11/2009), CAD s/p CABG, diabetes (HgbA1c 9.9 02/2011) presenting with acute shortness-of-breath secondary to COPD exacerbation, ?CHF exacerbation, ?CAP:  PULM: Acute dyspnea--2/2 COPD exacerbation, with community acquired pneumonia and OSA/OHS  History of COPD  Respiratory status improved and now sating well on 2L [place] -COPD exacerbation: change Duonebs to q6 - will d/c ctx and doxy and start levaquin 750mg  daily for a total course of antibiotics  of 10days.  - continue prednisone 60mg  qd x 10 days for COPD exacerbation -?CHF exacerbation: today's weight not recorded, but there was a  3 lb weight loss between 03/01 and 03/02.  - start furosemide 80mg  bid -?CAP. Questionable left lobe infiltrate on admission CXR>>>infiltrate  seems improved but is still present. Procalcitonin negative. Afebrile.  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 Will hold coumadin dose for today and have patient resume normal dose tomorrow and get close follow up at clinic for INR check Lab 05/25/11 0625 05/24/11 0500 05/23/11 1315 05/21/11 0909  INR 3.27* 2.03* 1.64* 2.7   Hypertension -Home  anti-hypertensives: bisoprolol 5 mg bid, isosorbide XR 15 mg qd, Lasix 40 bid, lisinopril 10 qd, spironolactone 12.5>>>resume lisinopril and bisoprolol. Holding isosorbide for now due to blood pressures that are okay off ACEi and beta-blocker; in clinic SBP 130-100s. See above for Lasix. Continue spironolactone.  -Continue home simvastatin and ASA   ENDO  History of diabetes mellitus, poorly controlled, insulin-dependent (HgbA1c 02/2011 9.9)  -Glc 140's to 300's Continue home insulin  -SSI  -Repeat HgbA1c>>>8.5. Improved from 02/2011. Follow-up as outpatient.  FEN/GI  -KVO IVF  -Carbohydrate-modified diet   PPx DVT: INR supratherapeutic SUP: not indicated  PSYCH  History of anxiety and chronic pain  -Resume home Tylenol #3, venlafaxine  -Holding home Xanax   Disposition:  anticipate discharge home today pending PT/OT recs   LOS: 2 days   Jasara Corrigan 05/25/2011, 11:46 AM [phone]

## 2011-05-25 NOTE — Progress Notes (Signed)
   CARE MANAGEMENT NOTE 05/25/2011  Patient:  [name], [name]   Account Number:  [id number]  Date Initiated:  05/25/2011  Documentation initiated by:  [facility]  Subjective/Objective Assessment:   HTN, DM     Action/Plan:   lives at home with wife, she is his primary caregiver   Anticipated DC Date:  05/25/2011   Anticipated DC Plan:  HOME W HOME HEALTH SERVICES      DC Planning Services  CM consult      [facility] Choice  HOME HEALTH   Choice offered to / List presented to:  C-1 Patient        HH arranged  HH-2 PT  HH-4 NURSE'S AIDE      HH agency  [facility]   Status of service:  Completed, signed off Medicare Important Message given?   (If response is "NO", the following Medicare IM given date fields will be blank) Date Medicare IM given:   Date Additional Medicare IM given:    Discharge Disposition:  HOME W HOME HEALTH SERVICES  Per UR Regulation:    Comments:  05/25/2011 1500 Contacted pt and states his primary caregiver was in hospital. Spoke to Unit RN, and she was going to contact MD. Pt will need 24 hour Supervision at home. Pt requested [facility] for [facility] PT. Contacted [facility] for [facility] PT and aide for scheduled d/c today with daughter. Isidoro Donning RN CCM Case Mgmt phone [phone]

## 2011-05-25 NOTE — Progress Notes (Signed)
I have seen and examined this patient. I have discussed with Dr Gwenlyn Saran.  I agree with their findings and plans as documented in their progress note for today.   1. Pneumonia +/- presence of acute decompensated heart failure. - Clinically improved.  Plan:  - systemic corticosteroid with Prednisone 60 mg daily for total of 7 days of corticosteroids - Continue SABD - Switch to Levaquin for total of 10 days antibiotics - Restart patient's home Lasix dose 80mg  TWICE DAILY - Physical Therapy/Occupational Therapy consultation for evaluation and treatment recommendations.

## 2011-05-25 NOTE — Progress Notes (Signed)
[id number]. nsg MD called re: pt's discharge per Physical therapy pt needs 24 hour observation. Md ordered home health aid for adl's aside from PT/OT per case manager home health aid only comes 1 hour 2-3 /wk. Case manager said she will place the order in but this is not a safe discharge because the wife is being admitted today as well. I talked to the daughter and she said her brother will be in and out  but will not be staying in the house. They are saying the mother will be discharge tomorrow but per MD it is just a possibility. Dr. Hamilton Capri notified  And she said she will come up and talk to the pt.and family.

## 2011-05-25 NOTE — Progress Notes (Signed)
ANTICOAGULATION CONSULT NOTE - Follow Up Consult  Pharmacy Consult for Coumadin  Indication: chest pain/ACS and h/o PE  Assessment: 71 y.o. male on coumadin PTA for h/o PE. Baseline INR 1.64 (subtherapeutic), but INR today is supratherapeutic at 3.27-->2.03. Home dose is 5mg  daily, except 7.5mg  on Mondays. MD wants to bridge with lovenox until INR therapeutic.  Will d/c lovenox since INR is elevated. No bleeding issues reported. Potential drug interactions include doxycycline & prednisone; levofloxacin will also likely affect INR if antibiotic tx is switched.   Pharmacy System-Based Medication Review: Infectious Disease: doxycycline/ceftriaxone D#3 for COPD exacerbation, will change to Levaquin to complete 5 days, afebrile, MRSA PCR neg. Cardiovascular: h/o HLD/CAD/CABG/HTN/CHF- on lasix for possible CHF exac, holding PTA bisoprolol/lisinopril due to low SBP in clinic, will restart Endocrinology: h/o diabetes (uncontrolled) - on home insulin regimen Neurology: h/o depression - on home venlafaxine, holding PTA xanax Nephrology: SCr & lytes ok and stable Pulmonary: h/o COPD - on oxygen/spiriva/albuterol PTA, now on scheduled Duonebs & prednisone 60mg  x5 days Hematology / Oncology: CBC ok & stable Best Practices: Lovenox/Coumadin Disposition: likely discharge home today if condition stable.  Goal of Therapy:  INR 2-3   Plan:  1. Hold coumadin today 2. If patient discharged home today, recommend to hold coumadin dose x1, then resume home dose & follow-up INR in clinic. 2. Discontinue lovenox 3. Check PT/INR in AM  Ival Bible, PharmD Clinical pharmacist, pager [phone]  05/25/2011 8:54 AM    Allergies  Allergen Reactions  . Diazepam     REACTION: unspecified    Patient Measurements: Height: 5\' 8"  (172.7 cm) Weight: 289 lb (131.09 kg) IBW/kg (Calculated) : 68.4  Ht: 68 inches Wt: 135 kg  Vital Signs: Temp: 97.6 F (36.4 C) (03/03 0549) Temp src: Oral (03/03 0549) BP:  104/45 mmHg (03/03 0549) Pulse Rate: 88  (03/03 0549)  Labs:  Basename 05/24/11 0500 05/23/11 2102 05/23/11 1316 05/23/11 1315  HGB -- 11.2* -- 11.8*  HCT -- 35.3* -- 36.3*  PLT -- 172 -- 173  APTT -- -- -- --  LABPROT 23.3* -- -- 19.7*  INR 2.03* -- -- 1.64*  HEPARINUNFRC -- -- -- --  CREATININE -- 1.07 -- 0.99  CKTOTAL -- -- 129 --  CKMB -- -- 3.3 --  TROPONINI -- -- <0.30 --   Estimated CrCl = 68 ml/min  Medical History: Past Medical History  Diagnosis Date  . Depression   . Diabetes mellitus   . COPD (chronic obstructive pulmonary disease)   . Adhesive capsulitis   . MRSA bacteremia     2011 - possible endocarditis, received 6 weeks IV treatment  . Hyperlipidemia   . Anxiety   . GERD (gastroesophageal reflux disease)   . CAD (coronary artery disease)   . 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 CVA (cerebral infarction) Questionable history  . Diverticulosis   . Osteomyelitis 2012    Sternoclavicular joint   . Chronic back pain   . Angina   . Myocardial infarction   . CHF (congestive heart failure)   . Shortness of breath   . Sleep apnea   . Chronic kidney disease     hx of BPH  . Neuromuscular disorder     HX of diabetic periferal neuropathy  . Hypertension     Medications:  See Med Rec Coumadin 5mg  daily except for 7.5mg  on Mondays

## 2011-05-25 NOTE — Evaluation (Signed)
Physical Therapy Evaluation Patient Details Name: [name] MRN: [id number] DOB: [birth year] Today's Date: 05/25/2011  Problem List:  Patient Active Problem List  Diagnoses  . DIABETIC PERIPHERAL NEUROPATHY  . HYPERCHOLESTEROLEMIA  . ANXIETY  . DEPRESSION  . HYPERTENSION  . CORONARY, ARTERIOSCLEROSIS  . CHF - EJECTION FRACTION < 50%  . REFLUX ESOPHAGITIS  . FROZEN RIGHT SHOULDER  . APNEA, SLEEP  . Encounter for long-term (current) use of anticoagulants  . BPH (benign prostatic hyperplasia)  . Venous stasis dermatitis  . COPD (chronic obstructive pulmonary disease)  . Osteomyelitis  . Vancomycin adverse reaction  . Cellulitis  . Fall  . History of osteomyelitis  . Peripheral vascular disease, unspecified  . Type II or unspecified type diabetes mellitus with peripheral circulatory disorders, uncontrolled  . Toe amputation status  . Cough    Past Medical History:  Past Medical History  Diagnosis Date  . Depression   . Diabetes mellitus   . COPD (chronic obstructive pulmonary disease)   . Adhesive capsulitis   . MRSA bacteremia     2011 - possible endocarditis, received 6 weeks IV treatment  . Hyperlipidemia   . Anxiety   . GERD (gastroesophageal reflux disease)   . CAD (coronary artery disease)   . 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 CVA (cerebral infarction) Questionable history  . Diverticulosis   . Osteomyelitis 2012    Sternoclavicular joint   . Chronic back pain   . Angina   . Myocardial infarction   . CHF (congestive heart failure)   . Shortness of breath   . Sleep apnea   . Chronic kidney disease     hx of BPH  . Neuromuscular disorder     HX of diabetic periferal neuropathy  . Hypertension    Past Surgical History:  Past Surgical History  Procedure Date  . Septic arthritis     Removal of infected CABG wire by Dr Edwyna Shell - 2011  . Coronary artery bypass graft 1992  . Fracture surgery 1980s    Hip  . Spine  surgery 2002    Cervical fusion vertebroplasty C3-4-5  . Coronary stent placement 1999, 2002  . Doppler echocardiography Sept 2011    EF 50-55% with some impaired diastolic relaxation  . Colonoscopy 2006     Multiple polyps removed, repeat in 5 years  . Right iliopopliteal bypass - 1983 1983  . Femoral-popliteal bypass graft 2008    Left  . Cardiac catheterization October 18, 2010    Known obstructive disease, no further blockages  . Cardiac catheterization 03/17/2005    EF 35-40%  . US echocardiography 03/04/2006    EF 50-55%  . Amputation 03/16/2011    Procedure: AMPUTATION DIGIT;  Surgeon: Chuck Hint, MD;  Location: [facility] OR;  Service: Vascular;  Laterality: Left;  Great toe    PT Assessment/Plan/Recommendation PT Assessment Clinical Impression Statement: D/C plan is for home today. PT Recommendation/Assessment: All further PT needs can be met in the next venue of care PT Problem List: Decreased strength;Decreased activity tolerance;Decreased mobility;Decreased safety awareness;Cardiopulmonary status limiting activity;Obesity PT Therapy Diagnosis : Difficulty walking;Generalized weakness PT Recommendation Follow Up Recommendations: Home health PT;Supervision/Assistance - 24 hour Equipment Recommended: None recommended by PT PT Goals     PT Evaluation Precautions/Restrictions  Precautions Precautions: Fall Prior Functioning  Home Living Lives With: Spouse Receives Help From: Family Type of Home: House Home Layout: One level Home Access: Ramped entrance Home Adaptive Equipment:  Art gallery manager;Wheelchair - manual;Walker - rolling Prior Function Level of Independence: Requires assistive device for independence;Needs assistance with ADLs;Needs assistance with homemaking;Independent with transfers Driving: No Cognition Cognition Arousal/Alertness: Awake/alert Overall Cognitive Status: Appears within functional limits for tasks assessed Sensation/Coordination     Extremity Assessment   Mobility (including Balance) Bed Mobility Bed Mobility: Yes Supine to Sit: 6: Modified independent (Device/Increase time) Sit to Supine: 6: Modified independent (Device/Increase time) Transfers Transfers: Yes Sit to Stand: 4: Min assist;With armrests;From chair/3-in-1 Sit to Stand Details (indicate cue type and reason): min guard assist Stand to Sit: 4: Min assist Stand to Sit Details: min guard assist, decreased control with descent Stand Pivot Transfers: 4: Min assist Stand Pivot Transfer Details (indicate cue type and reason): min guard assist, verbal cues for safety Ambulation/Gait Ambulation/Gait: Yes Ambulation/Gait Assistance: 4: Min assist Ambulation/Gait Assistance Details (indicate cue type and reason): min guard assist, portable O2 2 L/min via [employer] Ambulation Distance (Feet): 120 Feet Assistive device: Rolling walker Gait Pattern: Shuffle    Exercise    End of Session PT - End of Session Equipment Utilized During Treatment: Gait belt Activity Tolerance: Patient limited by fatigue Patient left: in chair;with call bell in reach;with family/visitor present Nurse Communication: Mobility status for transfers;Mobility status for ambulation General Behavior During Session: [facility] for tasks performed Cognition: [facility] for tasks performed  Ilda Foil 05/25/2011, 12:12 PM  Aida Raider, PT  Office # [phone] Pager [phone]

## 2011-05-25 NOTE — Discharge Instructions (Signed)
You were in the hospital because of shortness of breath that was most likely caused by a combination of a flare up of COPD as well as a pneumonia. We gave you some antibiotics in the hospital and you will go home on an antibiotic to take for 8 days called: Levaquin. We also started you on an oral steroid called prednisone 60mg  for you to take for 8 days.   Also, for your heart failure, we increased your lasix (furosemide) to 80mg  twice daily to help with some of the extra fluid that may also be contributing to making it hard to breathe.   For the warfarin, your INR level was high so we stopped the warfarin dose for today. For tomorrow, take 5mg  of coumadin and make a lab appointment at the clinic to get your INR checked early this week. You have an appointment with Dr. Gwendolyn Grant on Tuesday March 5th at 4pm.

## 2011-05-26 ENCOUNTER — Ambulatory Visit (INDEPENDENT_AMBULATORY_CARE_PROVIDER_SITE_OTHER): Payer: Medicare Other | Admitting: *Deleted

## 2011-05-26 DIAGNOSIS — Z7901 Long term (current) use of anticoagulants: Secondary | ICD-10-CM

## 2011-05-26 DIAGNOSIS — Z86718 Personal history of other venous thrombosis and embolism: Secondary | ICD-10-CM

## 2011-05-26 LAB — GLUCOSE, CAPILLARY: Glucose-Capillary: 197 mg/dL — ABNORMAL HIGH (ref 70–99)

## 2011-05-26 NOTE — Discharge Summary (Signed)
Physician Discharge Summary   Patient ID: [name] [id number] 70 y.o. 1941/01/21  Admit date: 05/23/2011  Discharge date and time: 05/25/2011  5:11 PM   Admitting Physician: Leighton Roach McDiarmid, MD   Discharge Physician:  Leighton Roach McDiarmid, MD  Admission Diagnoses: Hypotension [458] COPD (chronic obstructive pulmonary disease) [496] sob  Discharge Diagnoses:  1. Acute dyspnea- resolved 2. Community Acquired Pneumonia 3. COPD 4. Systolic Heart Failure 5. DM2 6. Hyperlipidemia 7. Anxiety  Admission Condition: fair  Discharged Condition: good  Indication for Admission: persistent dyspnea with significant co-morbidities putting him at risk for respiratory insfficiency  Hospital Course: 71 year old male with a history of obesity, COPD on 1.5L [place] at home, history of PE on warfarin (INR 1.6 on admission), CHF (EF 45-50% on ECHO 11/2009), CAD s/p CABG, diabetes (HgbA1c 9.9 02/2011) presenting with acute shortness-of-breath associated with URI symptoms. Had received 5 day course of azithromycin. Found to have a questionable left lobe infiltrate on CXR.  1. Dyspnea: thought to be due to pneumonia with a possible component of decompensated heart failure given BNP of 1795. Home lasix of 40mg  bid was increased to Lasix 80mg  bid with alleviation of symptoms. Was also started on doxy/ceftriaxone which was later switched to Levaquin 750mg  daily for a total of 10 days of antibiotics. Patient was also started on duonebs and prednisone 60mg  daily for potential COPD exacerbation.   2. 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 On day of discharge, INR was supra-theraputic at 3.27 at which point lovenox was stopped and coumadin was held for that day with close follow up at the INR clinic as an outpatient.   3. Hypertension: Patient had lower blood pressures on admission  and in clinic in 100's-130's systolic for which home lisinopril, isosorbide, and bisoprolol were held. Spironolactone was continued. On discharge, patient's blood pressures was 118/88.  4. Insulin dependent type 2 diabetes: A1C: 8.5. Restarted on home NPH 70units bid and sliding scale insulin. Patient's glucose on discharge was 171 to 178.  5. History of MI and hyperlipidemia: continued simvastatin and aspirin  6. Anxiety: patient restarted on velafaxine. Home xanax was held.   Consults:  - physical therapy and occupational therapy  Significant Diagnostic Studies:  CHEST - 2 VIEW  05/23/11 Comparison: 11/30/2010  Findings: Prior median sternotomy. Midline trachea. Mild  cardiomegaly. Mediastinal contours otherwise within normal limits.  No pleural effusion or pneumothorax.  Biapical pleural thickening. Acute on chronic interstitial  thickening. Low lung volumes with left greater than right  bibasilar opacity. Increased density posteriorly on the lateral  view, felt to be secondary.  IMPRESSION:  1. Acute on chronic interstitial thickening. Favor mild pulmonary  venous congestion.  2. Low lung volumes with left greater than right bibasilar  opacity. Favored represent volume loss/atelectasis. Especially at  the left lung base, infection is difficult to exclude. Consider  short-term radiographic follow-up.  CXR 2 view: 05/24/11 Findings: Cardiomegaly and cardiac surgical changes are again  noted.  Left lower lobe opacity is again noted - question atelectasis  versus airspace disease/pneumonia.  Mild interstitial prominence is unchanged.  No pleural effusion or pneumothorax noted.  No acute bony abnormalities are identified.  IMPRESSION:  Stable chest radiograph with continued left lower lung  atelectasis/airspace disease.  CBC    Component Value Date/Time   WBC 7.1 05/23/2011 2102   RBC 4.45 05/23/2011 2102  HGB 11.2* 05/23/2011 2102   HCT 35.3* 05/23/2011 2102   PLT 172 05/23/2011  2102   MCV 79.3 05/23/2011 2102   MCH 25.2* 05/23/2011 2102   MCHC 31.7 05/23/2011 2102   RDW 18.9* 05/23/2011 2102   LYMPHSABS 0.9 05/23/2011 1315   MONOABS 0.6 05/23/2011 1315   EOSABS 0.0 05/23/2011 1315   BASOSABS 0.0 05/23/2011 1315    CMP     Component Value Date/Time   NA 137 05/25/2011 0625   K 3.6 05/25/2011 0625   CL 101 05/25/2011 0625   CO2 24 05/25/2011 0625   GLUCOSE 175* 05/25/2011 0625   BUN 35* 05/25/2011 0625   CREATININE 1.27 05/25/2011 0625   CREATININE 1.06 11/01/2010 1020   CALCIUM 9.2 05/25/2011 0625   PROT 7.4 05/23/2011 1315   ALBUMIN 3.4* 05/23/2011 1315   AST 18 05/23/2011 1315   ALT 15 05/23/2011 1315   ALKPHOS 59 05/23/2011 1315   BILITOT 0.6 05/23/2011 1315   GFRNONAA 55* 05/25/2011 0625   GFRAA 63* 05/25/2011 0625    Lab  05/25/11 0625  05/24/11 0500  05/23/11 1315  05/21/11 0909   INR  3.27*  2.03*  1.64*  2.7    Discharge Exam: Temp: [97.4 F (36.3 C)-98.3 F (36.8 C)] 97.8 F (36.6 C) (03/03 1015)  Pulse Rate: [80-93] 90 (03/03 1015)  Resp: [20] 20 (03/03 1015)  BP: (104-127)/(45-71) 124/49 mmHg (03/03 1015)  SpO2: [92 %-100 %] 94 % (03/03 1015)  Weight: [289 lb (131.09 kg)] 289 lb (131.09 kg) (03/02 2057)  Weight change: -3 lb 1.8 oz (-1.41 kg)  Last BM Date: 05/22/11  Intake/Output from previous day:  03/02 0701 - 03/03 0700  In: 1497 [P.O.:1397; IV Piggyback:100]  Physical exam:  Gen: NAD, obese  CV: RRR, no m/r/g  Pulm: end expiratory wheezing, scattered crackles in lower lung fields, normal work of breathing sitting with Spencer in place.  Abd: soft, NT  Ext: 2+ pretibial pitting edema with chronic venous stasis changes noted  Neuro: alert and oriented, appropriate to questions  Disposition: Home-Health Care Svc  Patient Instructions:  Medication List  As of 05/26/2011  9:52 PM   STOP taking these medications         warfarin 7.5 MG tablet         TAKE these medications         acetaminophen-codeine 300-30 MG per tablet   Commonly known as: TYLENOL #3   Take 1  tablet by mouth every 6 (six) hours as needed. For pain.      albuterol 108 (90 BASE) MCG/ACT inhaler   Commonly known as: PROVENTIL HFA;VENTOLIN HFA   Inhale 2 puffs into the lungs every 4 (four) hours as needed for wheezing.      ALPRAZolam 0.5 MG tablet   Commonly known as: XANAX   Take 0.5 mg by mouth daily as needed. For anxiety.      BABY ASPIRIN 81 MG chewable tablet   Generic drug: aspirin   Chew 81 mg by mouth daily.      bisoprolol 5 MG tablet   Commonly known as: ZEBETA   Take 1 tablet (5 mg total) by mouth 2 (two) times daily.      furosemide 80 MG tablet   Commonly known as: LASIX   Take 1 tablet (80 mg total) by mouth 2 (two) times daily.      insulin NPH 100 UNIT/ML injection   Commonly known as: HUMULIN N,NOVOLIN N   Inject  70 Units into the skin 2 (two) times daily.      isosorbide mononitrate 30 MG 24 hr tablet   Commonly known as: IMDUR   Take 15 mg by mouth daily.      levofloxacin 750 MG tablet   Commonly known as: LEVAQUIN   Take 1 tablet (750 mg total) by mouth daily. Take for a total of 8 days.      lisinopril 10 MG tablet   Commonly known as: PRINIVIL,ZESTRIL   Take 1 tablet (10 mg total) by mouth daily.      nitroGLYCERIN 0.4 MG SL tablet   Commonly known as: NITROSTAT   Place 1 tablet (0.4 mg total) under the tongue as needed. For chest pains      predniSONE 20 MG tablet   Commonly known as: DELTASONE   Take 3 tablets (60 mg total) by mouth daily with breakfast. Take for a total of 8 days      simvastatin 80 MG tablet   Commonly known as: ZOCOR   Take 1 tablet (80 mg total) by mouth at bedtime.      spironolactone 25 MG tablet   Commonly known as: ALDACTONE   Take 0.5 tablets (12.5 mg total) by mouth daily.      Tamsulosin HCl 0.4 MG Caps   Commonly known as: FLOMAX   Take 1 capsule (0.4 mg total) by mouth daily after breakfast.      tiotropium 18 MCG inhalation capsule   Commonly known as: SPIRIVA   Place 1 capsule (18 mcg total)  into inhaler and inhale daily.      venlafaxine 37.5 MG 24 hr capsule   Commonly known as: EFFEXOR-XR   Take 37.5 mg by mouth daily.      warfarin 5 MG tablet   Commonly known as: COUMADIN   Take 5-7.5 mg by mouth daily. Take 5mg  every day, except take 7.5mg  on Mondays.           Activity: activity as tolerated Diet: diabetic diet Wound Care: none needed  Follow-up Information    Follow up with [facility], [place]. (Tuesday March 5 at 4 PM at [facility] -- Appointment already made for him)       Follow up with [facility] . (Home Health Physical Therapy and aide)    Contact information:   [phone]        Follow up items: - blood pressure and blood pressure medicine regimen - glucose - possibility of Bi-pap   Signed: Marena Chancy 05/26/2011 9:52 PM

## 2011-05-27 ENCOUNTER — Ambulatory Visit (INDEPENDENT_AMBULATORY_CARE_PROVIDER_SITE_OTHER): Payer: Medicare Other | Admitting: Family Medicine

## 2011-05-27 ENCOUNTER — Encounter: Payer: Self-pay | Admitting: Family Medicine

## 2011-05-27 VITALS — BP 104/76 | HR 72 | Temp 97.3°F | Ht 68.0 in | Wt 285.0 lb

## 2011-05-27 DIAGNOSIS — J449 Chronic obstructive pulmonary disease, unspecified: Secondary | ICD-10-CM

## 2011-05-27 DIAGNOSIS — J4489 Other specified chronic obstructive pulmonary disease: Secondary | ICD-10-CM

## 2011-05-27 MED ORDER — ALBUTEROL SULFATE (2.5 MG/3ML) 0.083% IN NEBU
2.5000 mg | INHALATION_SOLUTION | Freq: Once | RESPIRATORY_TRACT | Status: AC
  Administered 2011-05-27: 2.5 mg via RESPIRATORY_TRACT

## 2011-05-27 MED ORDER — IPRATROPIUM BROMIDE 0.02 % IN SOLN
0.5000 mg | Freq: Once | RESPIRATORY_TRACT | Status: AC
  Administered 2011-05-27: 0.5 mg via RESPIRATORY_TRACT

## 2011-05-27 MED ORDER — ALBUTEROL SULFATE (2.5 MG/3ML) 0.083% IN NEBU: 2.5000 mg | INHALATION_SOLUTION | Freq: Four times a day (QID) | RESPIRATORY_TRACT | Status: DC | PRN

## 2011-05-27 MED ORDER — FLUTICASONE-SALMETEROL 100-50 MCG/DOSE IN AEPB: 1.0000 | INHALATION_SPRAY | Freq: Two times a day (BID) | RESPIRATORY_TRACT | Status: DC

## 2011-05-27 NOTE — Discharge Summary (Signed)
I have seen and examined this patient. I have discussed with Dr Losq.  I agree with their findings and plans as documented in their discharge note.    

## 2011-05-27 NOTE — Patient Instructions (Addendum)
Bring your medicines next time.  Come back and see me Monday.   I will send in the Advair, see how much it costs. I will send in the nebulizer treatments.   We'll clean your ears next time.

## 2011-05-30 NOTE — Assessment & Plan Note (Addendum)
He still sounds fairly tight on exam today to the point where I thought he would benefit from nebulizer treatment. Provided and treatment of albuterol and Atrovent.  Physical exam after nebulization treatment: Breathing diffusely improved though still with wheezing at bases.  I did send him a new prescription for Advair as he thinks this might be a success of the Spiriva and easier for him to obtain. I think the Spiriva would help but taking something is definitely going to help better than taking nothing, which is what he would do with the Spiriva because it is too expensive.  Plan provide also home nebulizer treatments. Combivent might be a good choice for him especially if he's not on the Spiriva; that way he would get at least a short acting anticholinergic.

## 2011-05-30 NOTE — Progress Notes (Signed)
  Subjective:    Patient ID: [name], male    DOB: 1940-11-09, 71 y.o.   MRN: [id number]  HPI #1. Hospital followup for dyspnea: Patient is admitted for worsening dyspnea for the past week prior to admission. He was found to have elevated BNP greater than 1700. Also found to have worsening of COPD consistent with COPD exacerbation. It was unclear at time of discharge which exactly was concluding worst his dyspnea symptoms. He was discharged on his home medication regimen with the increase of Lasix as well as prednisone and antibiotic for COPD exacerbation.  He states has been doing fair since discharge from the hospital. He states his breathing has improved. He states that the Spiriva is too expensive for him to purchase. He has not been on this since December. This likely contributed to his exacerbation of hospital admission. He states he has been using his wife's Advair on an as-needed basis. He uses his inhaler of albuterol 1-2 times a day with some relief of dyspnea. Denies any fevers or chills. No cough. States his lower extremity edema is somewhat improved since increase of Lasix.  The following portions of the patient's history were reviewed and updated as appropriate: allergies, current medications, past medical history, family and social history, and problem list.  Patient is a nonsmoker. This includes his most recent hospitalization records.   Review of Systems See HPI above for review of systems.       Objective:   Physical Exam  Gen:  Alert, cooperative patient who appears stated age in no acute distress.  Vital signs reviewed. HEENT:  [place]/AT.  EOMI, PERRL.  MMM, tonsils non-erythematous, non-edematous.  External ears WNL, Bilateral TM's normal without retraction, redness or bulging.  Neck: No masses or thyromegaly or limitation in range of motion.  No cervical lymphadenopathy. Cardiac:  Regular rate and rhythm without murmur auscultated.  Good S1/S2. Lungs: Diffuse wheezing  throughout. He sounds tighter in his bases. He has no increased work of breathing. Abd:  Soft/nondistended/nontender.  Good bowel sounds throughout all four quadrants.  No masses noted.  Extremities: Venous stasis changes noted bilateral lower extremities. He has about +2 edema bilaterally which is actually better than his usual exam.      Assessment & Plan:

## 2011-06-02 ENCOUNTER — Ambulatory Visit (INDEPENDENT_AMBULATORY_CARE_PROVIDER_SITE_OTHER): Payer: Medicare Other | Admitting: Family Medicine

## 2011-06-02 ENCOUNTER — Ambulatory Visit (INDEPENDENT_AMBULATORY_CARE_PROVIDER_SITE_OTHER): Payer: Medicare Other | Admitting: *Deleted

## 2011-06-02 VITALS — BP 106/68 | HR 72 | Temp 97.7°F | Ht 68.0 in | Wt 285.0 lb

## 2011-06-02 DIAGNOSIS — Z7901 Long term (current) use of anticoagulants: Secondary | ICD-10-CM

## 2011-06-02 DIAGNOSIS — J449 Chronic obstructive pulmonary disease, unspecified: Secondary | ICD-10-CM

## 2011-06-02 DIAGNOSIS — H6120 Impacted cerumen, unspecified ear: Secondary | ICD-10-CM

## 2011-06-02 DIAGNOSIS — Z86718 Personal history of other venous thrombosis and embolism: Secondary | ICD-10-CM

## 2011-06-02 NOTE — Patient Instructions (Signed)
Stop taking the Imdur.  Take the Bisoprolol 1/2 a day.   Come back and see me in 1 month.

## 2011-06-03 ENCOUNTER — Ambulatory Visit: Payer: Medicare Other | Admitting: Family Medicine

## 2011-06-04 DIAGNOSIS — H6120 Impacted cerumen, unspecified ear: Secondary | ICD-10-CM | POA: Insufficient documentation

## 2011-06-04 NOTE — Assessment & Plan Note (Signed)
Much improved status post irrigation. Hearing much better. Physical exam was good afterwards.

## 2011-06-04 NOTE — Assessment & Plan Note (Signed)
Exacerbation much improved. She deniesher home and nausea he was getting exact benefit of albuterol inhaler. He is a much better with the nebulizers. I've encouraged him to fill his prescription of Advair. He states Spiriva is very expensive and is going to check the price of the Advair currently. Next plan would be to have him see prescriptions at the [facility].

## 2011-06-04 NOTE — Progress Notes (Signed)
  Subjective:    Patient ID: [name], male    DOB: Apr 01, 1940, 71 y.o.   MRN: [id number]  HPI #1. Followup for shortness of breath: He has been doing much better. He states that he likes the nebulized treatments of albuterol and Atrovent much better than the inhaler. He has been using his wife's Advair. He states that she has an extra Advair and therefore he was not using all of hers. She states she she is still continuing to use hers. He has not tried to obtain a new prescription although I said didn't want for him as well. No chest pain. Dyspnea upon prolonged exertion but not minimal exertion. Overall feels much better.  #2. Cerumen impaction: Found on exam last visit. Her room were concerned for his acute shortness of breath. He was to return today for followup from a shortness of breath as well as for ear irrigation for cerumen impaction. He does state that his wife and family members have noticed his hearing is decreased. Patient himself admits he has hearing decrease. No tinnitus. No feelings of fullness in his ears. No drainage.   Review of Systems See HPI above for review of systems.       Objective:   Physical Exam Gen:  Alert, cooperative patient who appears stated age in no acute distress.  Vital signs reviewed. Ears: Bilateral cerumen impaction noted. Exam after urination revealed nonperforated and normal pearly gray tympanic membranes and mildly irritated ear canals with removal of cerumen. Cardiac:  Regular rate and rhythm without murmur auscultated.  Good S1/S2. Lungs: wheezing bilateral bases. Rest lung fields are clear. No increased work of breathing. No rales noted. Abdomen: Obese       Assessment & Plan:

## 2011-06-09 ENCOUNTER — Ambulatory Visit (INDEPENDENT_AMBULATORY_CARE_PROVIDER_SITE_OTHER): Payer: Medicare Other | Admitting: *Deleted

## 2011-06-09 DIAGNOSIS — Z86718 Personal history of other venous thrombosis and embolism: Secondary | ICD-10-CM

## 2011-06-09 DIAGNOSIS — Z7901 Long term (current) use of anticoagulants: Secondary | ICD-10-CM

## 2011-06-12 ENCOUNTER — Ambulatory Visit: Payer: Medicare Other

## 2011-06-13 ENCOUNTER — Encounter: Payer: Self-pay | Admitting: Family Medicine

## 2011-06-17 ENCOUNTER — Encounter: Payer: Self-pay | Admitting: Vascular Surgery

## 2011-06-18 ENCOUNTER — Ambulatory Visit (INDEPENDENT_AMBULATORY_CARE_PROVIDER_SITE_OTHER): Payer: Medicare Other | Admitting: Vascular Surgery

## 2011-06-18 ENCOUNTER — Encounter: Payer: Self-pay | Admitting: Vascular Surgery

## 2011-06-18 VITALS — BP 90/60 | HR 67 | Temp 97.9°F | Resp 16 | Ht 68.0 in | Wt 287.0 lb

## 2011-06-18 DIAGNOSIS — I739 Peripheral vascular disease, unspecified: Secondary | ICD-10-CM

## 2011-06-18 NOTE — Progress Notes (Signed)
[facility] [place]  Patient name: [name] MRN: [id number] DOB: 1941/01/31 Sex: male  REASON FOR VISIT: follow up of left great toe amputation and left lower extremity bypass graft  HPI: [name] is a 71 y.o. male who I had performed a left carotid endarterectomy on in 1999 for symptomatic left carotid stenosis. He also undergone a left pop 2 artery and years repair by Dr. Madilyn Fireman in the past and most recently he had revision of his left femoropopliteal bypass graft on 06/22/2006. 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 He presented with gangrene of his left great toe with osteomyelitis and I performed a left great toe amputation on 03/16/2011.   His main complaint today he has been some increasing shortness of breath and also dizziness. This is, and gradually over the last 3 weeks. Shortness of breath is exacerbated by any activity. His dizziness is exacerbated by standing. He is not aware of any recent changes in his medications. He's had no claudication, rest pain, or nonhealing ulcers.  Past Medical History  Diagnosis Date  . Depression   . Diabetes mellitus   . COPD (chronic obstructive pulmonary disease)   . Adhesive capsulitis   . MRSA bacteremia     2011 - possible endocarditis, received 6 weeks IV treatment  . Hyperlipidemia   . Anxiety   . GERD (gastroesophageal reflux disease)   . CAD (coronary artery disease)   . 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 CVA (cerebral infarction) Questionable history  . Diverticulosis   . Osteomyelitis 2012    Sternoclavicular joint   . Chronic back pain   . Angina   . Myocardial infarction   . CHF (congestive heart failure)   . Shortness of breath   . Sleep apnea   . Chronic kidney disease     hx of BPH  .  Neuromuscular disorder     HX of diabetic periferal neuropathy  . Hypertension     Family History  Problem Relation Age of Onset  . Heart disease Father     SOCIAL HISTORY: History  Substance Use Topics  . Smoking status: Former Smoker    Quit date: 06/19/2000  . Smokeless tobacco: Never Used  . Alcohol Use: No    Allergies  Allergen Reactions  . Diazepam     REACTION: unspecified    Current Outpatient Prescriptions  Medication Sig Dispense Refill  . acetaminophen-codeine (TYLENOL #3) 300-30 MG per tablet Take 1 tablet by mouth every 6 (six) hours as needed. For pain.      Marland Kitchen albuterol (PROVENTIL) (2.5 MG/3ML) 0.083% nebulizer solution Take 3 mLs (2.5 mg total) by nebulization every 6 (six) hours as needed for wheezing.  150 mL  1  . ALPRAZolam (XANAX) 0.5 MG tablet Take 0.5 mg by mouth daily as needed. For anxiety.      Marland Kitchen aspirin (BABY ASPIRIN) 81 MG chewable tablet Chew 81 mg by mouth daily.       . bisoprolol (ZEBETA) 5 MG tablet Take 1 tablet (5 mg total) by mouth 2 (two) times daily.  90 tablet  2  . Fluticasone-Salmeterol (ADVAIR) 100-50 MCG/DOSE AEPB Inhale 1 puff into the lungs 2 (two) times daily.  1 each  3  . furosemide (LASIX) 80 MG tablet Take  1 tablet (80 mg total) by mouth 2 (two) times daily.  60 tablet  2  . insulin NPH (HUMULIN N) 100 UNIT/ML injection Inject 70 Units into the skin 2 (two) times daily.  10 mL  0  . isosorbide mononitrate (IMDUR) 30 MG 24 hr tablet Take 15 mg by mouth daily.      Marland Kitchen lisinopril (PRINIVIL,ZESTRIL) 10 MG tablet Take 1 tablet (10 mg total) by mouth daily.  90 tablet  2  . nitroGLYCERIN (NITROSTAT) 0.4 MG SL tablet Place 1 tablet (0.4 mg total) under the tongue as needed. For chest pains  30 tablet  1  . simvastatin (ZOCOR) 80 MG tablet Take 1 tablet (80 mg total) by mouth at bedtime.  90 tablet  2  . spironolactone (ALDACTONE) 25 MG tablet Take 0.5 tablets (12.5 mg total) by mouth daily.  60 tablet  2  . Tamsulosin HCl (FLOMAX) 0.4  MG CAPS Take 1 capsule (0.4 mg total) by mouth daily after breakfast.  90 capsule  2  . venlafaxine (EFFEXOR-XR) 37.5 MG 24 hr capsule Take 37.5 mg by mouth daily.      Marland Kitchen warfarin (COUMADIN) 5 MG tablet Take 5-7.5 mg by mouth daily. Take 5mg  every day, except take 7.5mg  on Mondays.      Marland Kitchen albuterol (PROVENTIL HFA;VENTOLIN HFA) 108 (90 BASE) MCG/ACT inhaler Inhale 2 puffs into the lungs every 4 (four) hours as needed for wheezing.  1 Inhaler  1  . tiotropium (SPIRIVA) 18 MCG inhalation capsule Place 1 capsule (18 mcg total) into inhaler and inhale daily.  30 capsule  3    REVIEW OF SYSTEMS: Arly.Keller ] denotes positive finding; [  ] denotes negative finding  CARDIOVASCULAR:  [ ]  chest pain   [ ]  chest pressure   [ ]  palpitations   Arly.Keller ] orthopnea   Arly.Keller ] dyspnea on exertion   [ ]  claudication   [ ]  rest pain   [ ]  DVT   [ ]  phlebitis PULMONARY:   [ ]  productive cough   [ ]  asthma   [ ]  wheezing NEUROLOGIC:   [ ]  weakness  [ ]  paresthesias  [ ]  aphasia  [ ]  amaurosis  Arly.Keller ] dizziness HEMATOLOGIC:   [ ]  bleeding problems   [ ]  clotting disorders MUSCULOSKELETAL:  [ ]  joint pain   [ ]  joint swelling [ ]  leg swelling GASTROINTESTINAL: [ ]   blood in stool  [ ]   hematemesis GENITOURINARY:  [ ]   dysuria  [ ]   hematuria PSYCHIATRIC:  [ ]  history of major depression INTEGUMENTARY:  [ ]  rashes  [ ]  ulcers CONSTITUTIONAL:  [ ]  fever   [ ]  chills  PHYSICAL EXAM: Filed Vitals:   06/18/11 0909  BP: 90/60  Pulse: 67  Temp: 97.9 F (36.6 C)  TempSrc: Oral  Resp: 16  Height: 5\' 8"  (1.727 m)  Weight: 287 lb (130.182 kg)  SpO2: 96%   Body mass index is 43.64 kg/(m^2). GENERAL: The patient is a well-nourished male, in no acute distress. The vital signs are documented above. CARDIOVASCULAR: There is a regular rate and rhythm without significant murmur appreciated. I do not detect carotid bruits. The feet are warm and well-perfused. He has bilateral lower extremity swelling. PULMONARY: There is good air exchange  bilaterally without wheezing or rales. He does have decreased breath sounds at both bases. ABDOMEN: Soft and non-tender with normal pitched bowel sounds. Because of his obesity it is difficult to assess for an aneurysm.  MUSCULOSKELETAL: There are no major deformities or cyanosis. His left great toe amputation site has healed. NEUROLOGIC: No focal weakness or paresthesias are detected. SKIN: There are no ulcers or rashes noted. He has hyperpigmentation bilaterally consistent with chronic venous insufficiency. PSYCHIATRIC: The patient has a normal affect.  MEDICAL ISSUES: PVD and CVI: The patient's left left foot is warm and well-perfused at in his graft is functioning well. He has significant chronic venous insufficiency with leg swelling and I've encouraged him to elevate his leg as much as possible. Toe amputation site has healed. I have ordered follow up ABIs and we will see him back in one year.  With respect to shortness of breath and dizziness have asked him to follow up with his primary care physician. He does have significant COPD. I did not detect any wheezing or rales on exam although this diminished breath sounds at both bases consistent with his COPD. With respect to his dizziness I explained that pressure was on the low side today and he may need to have his medications adjusted by his primary care physician.  Paityn Balsam S [facility] [place] Beeper: [phone]

## 2011-06-24 ENCOUNTER — Ambulatory Visit (INDEPENDENT_AMBULATORY_CARE_PROVIDER_SITE_OTHER): Payer: Medicare Other | Admitting: *Deleted

## 2011-06-24 DIAGNOSIS — Z86718 Personal history of other venous thrombosis and embolism: Secondary | ICD-10-CM

## 2011-06-24 DIAGNOSIS — Z7901 Long term (current) use of anticoagulants: Secondary | ICD-10-CM

## 2011-06-24 LAB — POCT INR: INR: 2.4

## 2011-07-02 ENCOUNTER — Ambulatory Visit (INDEPENDENT_AMBULATORY_CARE_PROVIDER_SITE_OTHER): Payer: Medicare Other | Admitting: Family Medicine

## 2011-07-02 ENCOUNTER — Encounter: Payer: Self-pay | Admitting: Family Medicine

## 2011-07-02 VITALS — BP 102/64 | HR 70 | Temp 98.4°F | Ht 68.0 in | Wt 284.0 lb

## 2011-07-02 DIAGNOSIS — J449 Chronic obstructive pulmonary disease, unspecified: Secondary | ICD-10-CM

## 2011-07-02 DIAGNOSIS — R5383 Other fatigue: Secondary | ICD-10-CM

## 2011-07-02 MED ORDER — ACETAMINOPHEN-CODEINE #3 300-30 MG PO TABS: 1.0000 | ORAL_TABLET | Freq: Four times a day (QID) | ORAL | Status: DC | PRN

## 2011-07-02 NOTE — Progress Notes (Signed)
  Subjective:    Patient ID: [name], male    DOB: June 24, 1940, 71 y.o.   MRN: [id number]  HPI 1.  Dyspnea:  Patient returns today for followup from previous diagnosis of worsening dyspnea secondary to COPD. This was a previously scheduled appointment. Patient has only complained today of weakness which has been a problem with patient the past. This seems to be secondary to hypotension. He describes weakness after walking short distances. See below.  His dyspnea is much improved since he began using his nebulizer at home. He is also using Advair as prescribed. He states this has greatly helped his dyspnea. He seems to be limited in mobility from deconditioning and pain rather than dyspnea. No fevers or chills. No acute episodes of shortness of breath.  He is also asking if maybe we can consider stopping some of his medications.    Review of Systems See HPI above for review of systems.       Objective:   Physical Exam  Gen:  Alert, cooperative patient who appears stated age in no acute distress.  Vital signs reviewed. HEENT:  [place]/AT, MMM Cardiac:  Regular rate and rhythm without murmur auscultated.  Good S1/S2. Pulm:  Clear to auscultation bilaterally with good air movement.  Only very minimal wheezing at bases to   Ext:  +2 edema bilaterally to mid shins. No signs of skin breakdown or rash today.      Assessment & Plan:

## 2011-07-02 NOTE — Patient Instructions (Addendum)
Keep taking the Tylenol with Codeine for your shoulder.   Stop taking the Isosorbide (Imdur).  Stopping this should help improve your blood pressure. If you have chest pain or need to take the nitro let me know.  Cut the Furosemide to 1 pill a day.  Weigh yourself like you have been. If you gain 2 lbs in 2 days take an extra pill.

## 2011-07-04 ENCOUNTER — Encounter: Payer: Self-pay | Admitting: Family Medicine

## 2011-07-04 DIAGNOSIS — R5383 Other fatigue: Secondary | ICD-10-CM | POA: Insufficient documentation

## 2011-07-04 NOTE — Assessment & Plan Note (Signed)
This is a long-term problem patient. It is worse when he experiences lower blood pressures. We've made some changes to his medications. He is denying any chest pain for at least past several months. He currently cannot remember the last time he has taken nitroglycerin. Recommendation they'll be to stop his Imdur in order to help with hypotension. A much more hesitant about starting ACE or beta blocker in this patient with congestive heart failure Given red flags and warnings that would prompt return here in clinic or call us as needed. Also gave indications to the emergency room if he is having worsening chest pain. Reiterated instructions regarding nitroglycerin. Discuss that stopping his Imdur may cause angina to worsen. Would also want to limit risk of watershed infarct in patient with previous ischemic stroke and believe that if we can help his perfusion he may have less trouble with fatigue. We'll particularly want to prevent any watershed infarcts from hypotension.

## 2011-07-04 NOTE — Assessment & Plan Note (Signed)
He seems to be reaching stable baseline with nebulizer use at home and the Advair use. Again do not think that this man is a limit his mobility. Continue nebulizer and Advair.

## 2011-07-09 ENCOUNTER — Ambulatory Visit (INDEPENDENT_AMBULATORY_CARE_PROVIDER_SITE_OTHER): Payer: Medicare Other | Admitting: *Deleted

## 2011-07-09 DIAGNOSIS — Z7901 Long term (current) use of anticoagulants: Secondary | ICD-10-CM

## 2011-07-09 DIAGNOSIS — Z86718 Personal history of other venous thrombosis and embolism: Secondary | ICD-10-CM

## 2011-07-18 ENCOUNTER — Telehealth: Payer: Self-pay | Admitting: *Deleted

## 2011-07-18 NOTE — Telephone Encounter (Signed)
Received call from [facility] practitioner at [employer].  Went to visit patient in home on Wednesday (07/16/11).  Patient has fallen 3-4 times within past 6 months.  Checked BP---100/50.  Unsure if BP drops lower and is causing him to fall.  Concerned about patient falling and not having anyone to help him because his wife works nights and he has diabetic neuropathy.  Patient also has been having diarrhea x 2 weeks.  Patient has appt with Dr. Patty Sermons on Monday (07/21/11).  Will route note to Dr. Gwendolyn Grant for any advice and call patient.  Gaylene Brooks, RN

## 2011-07-20 NOTE — Telephone Encounter (Signed)
We should have him come in and [facility] HIS MEDICATIONS.  We have already stopped some BP meds and decreased almost everything else to its very lowest dose and then decreased even that cutting pills in half, but I'm unsure what exactly he's taking and how much.  It's always a little different whenever he comes in to see me.  Definitely need to maybe stop 1 or 2 meds - difficulty is that he is CHF patient with EF < 50%.  Recommend also that he keep appt with cardiology.

## 2011-07-21 ENCOUNTER — Ambulatory Visit (INDEPENDENT_AMBULATORY_CARE_PROVIDER_SITE_OTHER): Payer: Medicare Other | Admitting: Cardiology

## 2011-07-21 ENCOUNTER — Encounter: Payer: Self-pay | Admitting: Cardiology

## 2011-07-21 VITALS — BP 100/60 | HR 70 | Ht 68.0 in | Wt 294.0 lb

## 2011-07-21 DIAGNOSIS — I798 Other disorders of arteries, arterioles and capillaries in diseases classified elsewhere: Secondary | ICD-10-CM

## 2011-07-21 DIAGNOSIS — E1159 Type 2 diabetes mellitus with other circulatory complications: Secondary | ICD-10-CM

## 2011-07-21 DIAGNOSIS — I1310 Hypertensive heart and chronic kidney disease without heart failure, with stage 1 through stage 4 chronic kidney disease, or unspecified chronic kidney disease: Secondary | ICD-10-CM

## 2011-07-21 DIAGNOSIS — I2510 Atherosclerotic heart disease of native coronary artery without angina pectoris: Secondary | ICD-10-CM

## 2011-07-21 DIAGNOSIS — J449 Chronic obstructive pulmonary disease, unspecified: Secondary | ICD-10-CM

## 2011-07-21 NOTE — Telephone Encounter (Signed)
Called patient and left message to call our office back.  Asahd Can Ann, RN  

## 2011-07-21 NOTE — Progress Notes (Signed)
[name] Date of Birth:  07/24/1940 [facility] [address] [place], [place]  [address] [phone]         Fax   [phone]  History of Present Illness: This pleasant 71 year old gentleman is seen for a scheduled four-month followup office visit.  He has a complex past medical history.  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  He has been on long-term Coumadin because of angina decubitus.  In addition the patient has exhausted his obesity, hypercholesterolemia, diabetes, osteoarthritis, and venous stasis dermatitis.  He has adult onset diabetes mellitus.  He has COPD.  Current Outpatient Prescriptions  Medication Sig Dispense Refill  . acetaminophen-codeine (TYLENOL #3) 300-30 MG per tablet Take 1 tablet by mouth every 6 (six) hours as needed. For pain.  60 tablet  1  . albuterol (PROVENTIL HFA;VENTOLIN HFA) 108 (90 BASE) MCG/ACT inhaler Inhale 2 puffs into the lungs every 4 (four) hours as needed for wheezing.  1 Inhaler  1  . albuterol (PROVENTIL) (2.5 MG/3ML) 0.083% nebulizer solution Take 3 mLs (2.5 mg total) by nebulization every 6 (six) hours as needed for wheezing.  150 mL  1  . ALPRAZolam (XANAX) 0.5 MG tablet Take 0.5 mg by mouth daily as needed. For anxiety.      Marland Kitchen aspirin (BABY ASPIRIN) 81 MG chewable tablet Chew 81 mg by mouth daily.       . bisoprolol (ZEBETA) 5 MG tablet Take 1 tablet (5 mg total) by mouth 2 (two) times daily.  90 tablet  2  . Fluticasone-Salmeterol (ADVAIR) 100-50 MCG/DOSE AEPB Inhale 1 puff into the lungs 2 (two) times daily.  1 each  3  . furosemide (LASIX) 80 MG tablet Take 40 mg by mouth 2 (two) times daily.      . insulin NPH (HUMULIN N) 100 UNIT/ML injection Inject 70 Units into the skin 2 (two) times daily.  10 mL  0  . lisinopril (PRINIVIL,ZESTRIL) 10 MG tablet Take 1 tablet (10 mg total) by mouth daily.  90 tablet  2  . nitroGLYCERIN  (NITROSTAT) 0.4 MG SL tablet Place 1 tablet (0.4 mg total) under the tongue as needed. For chest pains  30 tablet  1  . simvastatin (ZOCOR) 80 MG tablet Take 1 tablet (80 mg total) by mouth at bedtime.  90 tablet  2  . Tamsulosin HCl (FLOMAX) 0.4 MG CAPS Take 1 capsule (0.4 mg total) by mouth daily after breakfast.  90 capsule  2  . venlafaxine (EFFEXOR-XR) 37.5 MG 24 hr capsule Take 37.5 mg by mouth daily.      Marland Kitchen warfarin (COUMADIN) 5 MG tablet Take 5-7.5 mg by mouth daily. Take 5mg  every day, except take 7.5mg  on Mondays.      Marland Kitchen DISCONTD: furosemide (LASIX) 80 MG tablet Take 1 tablet (80 mg total) by mouth 2 (two) times daily.  60 tablet  2    Allergies  Allergen Reactions  . Diazepam     REACTION: unspecified    Patient Active Problem List  Diagnoses  . DIABETIC PERIPHERAL NEUROPATHY  . HYPERCHOLESTEROLEMIA  . ANXIETY  . DEPRESSION  . HYPERTENSION  . CORONARY, ARTERIOSCLEROSIS  . CHF - EJECTION FRACTION < 50%  . REFLUX ESOPHAGITIS  . FROZEN RIGHT SHOULDER  . APNEA, SLEEP  . Encounter for long-term (current) use of anticoagulants  . BPH (benign prostatic hyperplasia)  . Venous stasis dermatitis  . COPD (chronic obstructive pulmonary  disease)  . Osteomyelitis  . Vancomycin adverse reaction  . Cellulitis  . Fall  . History of osteomyelitis  . Peripheral vascular disease, unspecified  . Type II or unspecified type diabetes mellitus with peripheral circulatory disorders, uncontrolled  . Toe amputation status  . Fatigue    History  Smoking status  . Former Smoker  . Quit date: 06/19/2000  Smokeless tobacco  . Never Used    History  Alcohol Use No    Family History  Problem Relation Age of Onset  . Heart disease Father     Review of Systems: Constitutional: no fever chills diaphoresis or fatigue or change in weight.  Head and neck: no hearing loss, no epistaxis, no photophobia or visual disturbance. Respiratory: No cough, shortness of breath or  wheezing. Cardiovascular: No chest pain peripheral edema, palpitations. Gastrointestinal: No abdominal distention, no abdominal pain, no change in bowel habits hematochezia or melena. Genitourinary: No dysuria, no frequency, no urgency, no nocturia. Musculoskeletal:No arthralgias, no back pain, no gait disturbance or myalgias. Neurological: No dizziness, no headaches, no numbness, no seizures, no syncope, no weakness, no tremors. Hematologic: No lymphadenopathy, no easy bruising. Psychiatric: No confusion, no hallucinations, no sleep disturbance.    Physical Exam: Filed Vitals:   07/21/11 1443  BP: 100/60  Pulse: 70   the general appearance reveals a very large gentleman who has a frozen right shoulder and moves with great difficulty because of arthritic problems in his hips and legs.Pupils equal and reactive.   Extraocular Movements are full.  There is no scleral icterus.  The mouth and pharynx are normal.  The neck is supple.  The carotids reveal no bruits.  The jugular venous pressure is normal.  The thyroid is not enlarged.  There is no lymphadenopathy.  Chest reveals distant breath sounds.  No rhonchi or wheezing.The abdomen is soft and nontender. Bowel sounds are normal. The liver and spleen are not enlarged. There Are no abdominal masses. There are no bruits.  Cardiac exam reveals a regular rhythm without murmur gallop rub or click.  Extremities show significant stasis dermatitis worse on the left.  We did not do an EKG.  We reviewed the EKG done 05/23/11 at the hospital which shows normal sinus rhythm with PACs in bigeminy and an old inferior wall myocardial infarction     Assessment / Plan: The patient is to continue same medications.  He will try harder to lose more weight.  His weight is down 3 pounds since last visit.  He will return in 4 months for followup office visit and EKG

## 2011-07-21 NOTE — Telephone Encounter (Signed)
Will probably be good for someone to see him in next week to make sure he's not still hypotensive and falling.

## 2011-07-21 NOTE — Assessment & Plan Note (Signed)
The patient has significant COPD.  He has not been having any cough or sputum production.

## 2011-07-21 NOTE — Assessment & Plan Note (Signed)
The patient has not been expressing any recent severe chest pain or angina.  He is quite sedentary because of his debilitation and his musculoskeletal problems and arthritis

## 2011-07-21 NOTE — Patient Instructions (Signed)
Your physician recommends that you continue on your current medications as directed. Please refer to the Current Medication list given to you today.   Office visit with  Dr. Patty Sermons on 11/20/11 at 11:45

## 2011-07-21 NOTE — Telephone Encounter (Signed)
Patient called back.  Has an appt with cardiologist today and will ask him to send office notes from today's visit to Dr. Gwendolyn Grant.  Dr. Gwendolyn Grant will be out of office month of May.  Next available appt is August 25, 2011.  Will check with Dr. Gwendolyn Grant this afternoon  to see if patient needs to be seen sooner with another doctor.  Will call patient back.  Gaylene Brooks, RN

## 2011-07-21 NOTE — Assessment & Plan Note (Signed)
Denies any significant hypoglycemic episodes.

## 2011-07-22 NOTE — Telephone Encounter (Signed)
Called patient and left message to call our office back for appt per Dr. Gwendolyn Grant.  Gaylene Brooks, RN

## 2011-07-23 ENCOUNTER — Ambulatory Visit (INDEPENDENT_AMBULATORY_CARE_PROVIDER_SITE_OTHER): Payer: Medicare Other | Admitting: *Deleted

## 2011-07-23 DIAGNOSIS — Z7901 Long term (current) use of anticoagulants: Secondary | ICD-10-CM

## 2011-07-23 DIAGNOSIS — Z86718 Personal history of other venous thrombosis and embolism: Secondary | ICD-10-CM

## 2011-07-23 LAB — POCT INR: INR: 3.4

## 2011-07-23 NOTE — Telephone Encounter (Signed)
Patient's wife called back.  Scheduled appt with Dr. Earnest Bailey for 07/28/11 at 8:30am to evaluate for hypotension and falling.  Gaylene Brooks, RN

## 2011-07-28 ENCOUNTER — Ambulatory Visit (INDEPENDENT_AMBULATORY_CARE_PROVIDER_SITE_OTHER): Payer: Medicare Other | Admitting: Family Medicine

## 2011-07-28 ENCOUNTER — Encounter: Payer: Self-pay | Admitting: Family Medicine

## 2011-07-28 VITALS — BP 106/64 | HR 68 | Temp 98.1°F | Wt 290.0 lb

## 2011-07-28 DIAGNOSIS — W19XXXA Unspecified fall, initial encounter: Secondary | ICD-10-CM

## 2011-07-28 DIAGNOSIS — S4980XA Other specified injuries of shoulder and upper arm, unspecified arm, initial encounter: Secondary | ICD-10-CM

## 2011-07-28 NOTE — Assessment & Plan Note (Addendum)
Likely multifactorial due to peripheral neuropathy, arthritis (poor proprioception and balance).  Polypharmacy possibly contributing some.  BP is low, but at baseline and is not orthostatic.  No syncope or presyncope.    He states he is taking xanax.  (I have deleted it off the list)  Other meds, likely benefits outweigh risks.  We discussed safety measures- he declines PT referal- had had it in the past and does not feel he can afford it at this time. He will work on making sure he consistently usuing his assistive devices in the home, continues the exercises he was taught at PT.  Will follow-up with PCP in 1-2 months

## 2011-07-28 NOTE — Progress Notes (Signed)
  Subjective:    Patient ID: [name], male    DOB: 03-03-1941, 71 y.o.   MRN: [id number]  HPI Here to discuss falling more than usual.  In the past 6 months, has had 5 falls.  No syncope.  Feels like he loses balance.  Feels dizzy sometimes.  The last time a week ago- was walking around the bed to his desk.  Had to get his wife to help him up.   Dizziness went away after standing on the edge of the bed for a bit.  Another time- fell getting out of the bed.      I have reviewed patient's  PMH, FH, and Social history and Medications as related to this visit. Sig for significant peripheral neuropathy, arthritis  Review of Systemsno chest pain, dyspnea, fever, new focal weakness.     Objective:   Physical Exam GEN: Alert & Oriented, No acute distress, obese.  One pronged cane. CV:  Regular Rate & Rhythm, no murmur Respiratory:  Normal work of breathing, CTAB Abd:  + BS, soft, no tenderness to palpation Ext: marked chronic venous stasis Feet:  No sensation on monofilament testing.  Toe amputation noted.  No skin breakdown or infection.        Assessment & Plan:

## 2011-07-28 NOTE — Patient Instructions (Signed)
Try to keep doing those exercises physical therapy taught you  No changes in medicines today  Follow-up with Dr. Gwendolyn Grant in 2 months

## 2011-08-04 ENCOUNTER — Ambulatory Visit (INDEPENDENT_AMBULATORY_CARE_PROVIDER_SITE_OTHER): Payer: Medicare Other | Admitting: *Deleted

## 2011-08-04 ENCOUNTER — Ambulatory Visit: Payer: Medicare Other | Admitting: Family Medicine

## 2011-08-04 DIAGNOSIS — Z7901 Long term (current) use of anticoagulants: Secondary | ICD-10-CM

## 2011-08-04 DIAGNOSIS — Z86718 Personal history of other venous thrombosis and embolism: Secondary | ICD-10-CM

## 2011-08-04 LAB — POCT INR: INR: 1.2

## 2011-08-12 ENCOUNTER — Ambulatory Visit (INDEPENDENT_AMBULATORY_CARE_PROVIDER_SITE_OTHER): Payer: Medicare Other | Admitting: *Deleted

## 2011-08-12 ENCOUNTER — Encounter: Payer: Self-pay | Admitting: Family Medicine

## 2011-08-12 ENCOUNTER — Ambulatory Visit (INDEPENDENT_AMBULATORY_CARE_PROVIDER_SITE_OTHER): Payer: Medicare Other | Admitting: Family Medicine

## 2011-08-12 VITALS — BP 136/70 | HR 76 | Temp 97.8°F

## 2011-08-12 DIAGNOSIS — L03116 Cellulitis of left lower limb: Secondary | ICD-10-CM

## 2011-08-12 DIAGNOSIS — L03119 Cellulitis of unspecified part of limb: Secondary | ICD-10-CM

## 2011-08-12 DIAGNOSIS — Z86718 Personal history of other venous thrombosis and embolism: Secondary | ICD-10-CM

## 2011-08-12 DIAGNOSIS — Z7901 Long term (current) use of anticoagulants: Secondary | ICD-10-CM

## 2011-08-12 DIAGNOSIS — L02419 Cutaneous abscess of limb, unspecified: Secondary | ICD-10-CM

## 2011-08-12 LAB — POCT INR: INR: 2.5

## 2011-08-12 MED ORDER — DOXYCYCLINE HYCLATE 100 MG PO TABS: 100.0000 mg | ORAL_TABLET | Freq: Two times a day (BID) | ORAL | Status: AC

## 2011-08-12 NOTE — Patient Instructions (Signed)
Doxycycline is antibitoic for infection  Follow-up in 1 (next Tuesday for recheck)  Keep a close eyes on your toes- you are at high risk for amputation.  If you notice they become pale, blue, cold- let us know immediately.

## 2011-08-12 NOTE — Progress Notes (Signed)
  Subjective:    Patient ID: [name], male    DOB: 1940/06/09, 71 y.o.   MRN: [id number]  HPI left leg swelling  Larey Seat last week and hurt his anterior shin.  Has been wrapping swollen leg with desitin and gauze.  Continued pain and weeping.  No pus drainage.  I have reviewed patient's  PMH, FH, and Social history and Medications as related to this visit. Has history of PAD and toe amputation, venous insufficiency, CAD, severe peripheral neuropathy, and DM.  Has needed wound care and Unna boots in the past.  Has tolerated Unna boots well despite PAD.  Review of Systems See HPI    Objective:   Physical Exam GEN: Alert & Oriented, No acute distress CV:  Regular Rate & Rhythm, no murmur Respiratory:  Normal work of breathing, CTAB Abd:  + BS, soft, no tenderness to palpation Ext: 2+ pitting edema.   left leg with several weeping sites anteriorly and posteriorly.  Large vertical excoriation on mid anterior shin.  Some erythema, mostly chronic venous stasis changes.         Assessment & Plan:

## 2011-08-12 NOTE — Assessment & Plan Note (Addendum)
Some possible early cellulitis due to trauma, no purulence of abscess today.  More concerning is edema inhibiting wound healing with complication of PAD, DM, severe peripheral neuropathy.  High risk of  Amputation.  Hx of previous amputation.  Placed unna boot on today, rxed doxycycline. Given instructions to closely watch for signs of decreased foot perfusion.  Will return in 1 week for removal and further care as needed.

## 2011-08-19 ENCOUNTER — Encounter: Payer: Self-pay | Admitting: Family Medicine

## 2011-08-19 ENCOUNTER — Ambulatory Visit (INDEPENDENT_AMBULATORY_CARE_PROVIDER_SITE_OTHER): Payer: Medicare Other | Admitting: Family Medicine

## 2011-08-19 ENCOUNTER — Ambulatory Visit: Payer: Medicare Other

## 2011-08-19 VITALS — BP 118/70 | HR 60 | Temp 98.3°F | Ht 68.0 in | Wt 305.0 lb

## 2011-08-19 DIAGNOSIS — I5022 Chronic systolic (congestive) heart failure: Secondary | ICD-10-CM

## 2011-08-19 DIAGNOSIS — E119 Type 2 diabetes mellitus without complications: Secondary | ICD-10-CM

## 2011-08-19 DIAGNOSIS — L03119 Cellulitis of unspecified part of limb: Secondary | ICD-10-CM

## 2011-08-19 DIAGNOSIS — L03116 Cellulitis of left lower limb: Secondary | ICD-10-CM

## 2011-08-19 DIAGNOSIS — R5383 Other fatigue: Secondary | ICD-10-CM

## 2011-08-19 DIAGNOSIS — R5381 Other malaise: Secondary | ICD-10-CM

## 2011-08-19 LAB — COMPREHENSIVE METABOLIC PANEL
ALT: 13 U/L (ref 0–53)
AST: 14 U/L (ref 0–37)
Alkaline Phosphatase: 65 U/L (ref 39–117)
CO2: 25 mEq/L (ref 19–32)
Creat: 1.19 mg/dL (ref 0.50–1.35)
Sodium: 138 mEq/L (ref 135–145)
Total Bilirubin: 0.4 mg/dL (ref 0.3–1.2)
Total Protein: 5.8 g/dL — ABNORMAL LOW (ref 6.0–8.3)

## 2011-08-19 LAB — CBC WITH DIFFERENTIAL/PLATELET
Basophils Absolute: 0.1 10*3/uL (ref 0.0–0.1)
Eosinophils Absolute: 0.2 10*3/uL (ref 0.0–0.7)
Eosinophils Relative: 2 % (ref 0–5)
Lymphs Abs: 1.6 10*3/uL (ref 0.7–4.0)
MCH: 25.2 pg — ABNORMAL LOW (ref 26.0–34.0)
MCV: 80.1 fL (ref 78.0–100.0)
Neutrophils Relative %: 73 % (ref 43–77)
Platelets: 186 10*3/uL (ref 150–400)
RBC: 4.93 MIL/uL (ref 4.22–5.81)
RDW: 17.5 % — ABNORMAL HIGH (ref 11.5–15.5)
WBC: 10.2 10*3/uL (ref 4.0–10.5)

## 2011-08-19 NOTE — Assessment & Plan Note (Signed)
Will place Unna boots bilaterally today as weeping, skin breakdown to encourage continues progress in wound healing.  Patient does not like to wear compression stockings, he is on high dose of lasix already.  Will follow-up in 6 days with PCP.  I drew CBC and CMET to evaluate renal function, general fatigue.  Discussed with patient red flags to seek medical care sooner.

## 2011-08-19 NOTE — Patient Instructions (Signed)
Come back on Monday for recheck  If you notice you feel worse, please seek medical care

## 2011-08-19 NOTE — Assessment & Plan Note (Signed)
a1c 8.1% today. Will follow-up with PCP in 6 days for further titration of meds.

## 2011-08-19 NOTE — Assessment & Plan Note (Addendum)
Improved.  Finished 1 week course of doxycycline, will place unna boots bilaterally for LE edema

## 2011-08-19 NOTE — Progress Notes (Signed)
  Subjective:    Patient ID: Matthew Saras, male    DOB: 1940-07-31, 71 y.o.   MRN: [id number]  HPI Here for one week follow-up of left LE cellulitis and edema  Was prescribed doxycycline and unna boot was placed.  Patient removed Unna boot two days ago as it got wet.  Redness and weeping have improved.    Patient does not appear well this morning.  He denies any change from baseline other than feeling tired.  Denies chest pain, dyspnea, abdominal pain, nausea, vomiting, dizziness.  Not able to elicit any symptoms- he states his breathing is at baseline.    Review of Systemssee HPI     Objective:   Physical ExamVital reviewed.  Pulse ox 92%, at baseline.  GEN: Alert & Oriented, No acute distress.  Not as cheerful and talkative as seen last week. HEENT: [place]/AT. EOMI, PERRLA, no conjunctival injection or scleral icterus.  Oropharynx is without erythema or exudates.  No anterior or posterior cervical lymphadenopathy. CV:  Regular Rate & Rhythm, no murmur, no JVD Respiratory:  Normal work of breathing, CTAB Abd:  + BS, soft, no tenderness to palpation Ext: Left leg with 2+ pitting edema, wound over shin is well healing without signs of cellulitis.  Area of weeping is improved in the back of her leg. Right leg: no cellulitis, 2+ pitting edema.  Several areas weeping.       Assessment & Plan:

## 2011-08-19 NOTE — Assessment & Plan Note (Signed)
Chronic fatigue, patient denies any symptoms of chest pain or dyspnea. Blood pressure, pulse, pulse ox at baseline.  Weight is up, but no evidence of CHF exacerbation at this point.  Will draw labs: CMET, CBC, BNP.  WIll follow-up with PCP in 6 days

## 2011-08-21 ENCOUNTER — Telehealth: Payer: Self-pay | Admitting: Family Medicine

## 2011-08-21 NOTE — Telephone Encounter (Signed)
Called to check on patient and see why his follow-up appt to remove unna boots was cancelled.  Wife states she cancelled the appointment because they plan on going on vacation and they were just going to do it themselves.  She is agreeable to coming in on Monday morning before they leave to have unna boots removed and edema and cellulitis evaluated.

## 2011-08-21 NOTE — Assessment & Plan Note (Signed)
BNP and increased LE edema suggests heart failure is worsening.  May benefit from addition of spironolactone.  Patient has follow-up with PCP in 4 days to remove unna boot which he cancelled.  Will ask patient to follow-up to discuss further with PCP or cardiologist

## 2011-08-21 NOTE — Progress Notes (Signed)
Addended by: Macy Mis on: 08/21/2011 11:36 AM   Modules accepted: Level of Service

## 2011-08-22 ENCOUNTER — Ambulatory Visit: Payer: Medicare Other

## 2011-08-25 ENCOUNTER — Ambulatory Visit (INDEPENDENT_AMBULATORY_CARE_PROVIDER_SITE_OTHER): Payer: Medicare Other | Admitting: *Deleted

## 2011-08-25 ENCOUNTER — Encounter: Payer: Self-pay | Admitting: Family Medicine

## 2011-08-25 ENCOUNTER — Ambulatory Visit (INDEPENDENT_AMBULATORY_CARE_PROVIDER_SITE_OTHER): Payer: Medicare Other | Admitting: Family Medicine

## 2011-08-25 ENCOUNTER — Ambulatory Visit: Payer: Medicare Other | Admitting: Family Medicine

## 2011-08-25 VITALS — BP 98/62 | HR 80 | Temp 98.1°F | Ht 68.0 in | Wt 300.0 lb

## 2011-08-25 DIAGNOSIS — I5022 Chronic systolic (congestive) heart failure: Secondary | ICD-10-CM

## 2011-08-25 DIAGNOSIS — E1159 Type 2 diabetes mellitus with other circulatory complications: Secondary | ICD-10-CM

## 2011-08-25 DIAGNOSIS — Z7901 Long term (current) use of anticoagulants: Secondary | ICD-10-CM

## 2011-08-25 DIAGNOSIS — I798 Other disorders of arteries, arterioles and capillaries in diseases classified elsewhere: Secondary | ICD-10-CM

## 2011-08-25 DIAGNOSIS — E119 Type 2 diabetes mellitus without complications: Secondary | ICD-10-CM

## 2011-08-25 DIAGNOSIS — Z86718 Personal history of other venous thrombosis and embolism: Secondary | ICD-10-CM

## 2011-08-25 DIAGNOSIS — L03116 Cellulitis of left lower limb: Secondary | ICD-10-CM

## 2011-08-25 DIAGNOSIS — L02419 Cutaneous abscess of limb, unspecified: Secondary | ICD-10-CM

## 2011-08-25 NOTE — Assessment & Plan Note (Signed)
Discussed medications with patient, he states he is taking 70 units Humulin N currently.  He is on his way out town for vacation, did not have time to discuss this.  He will only be gone 2-3 days, he states he will make an appt later this week to go over diabetic medications. Possible switch to Lantus for better long-term coverage.

## 2011-08-25 NOTE — Patient Instructions (Signed)
Come back and see me after you get back from your vacation.   We will need to make some changes to your diabetes medicines at that time.

## 2011-08-25 NOTE — Progress Notes (Signed)
  Subjective:    Patient ID: [name], male    DOB: [birth year], 71 y.o.   MRN: [id number]  HPI 1.  LE cellulitis:  Much improved.  Wearing Unna boots daily.  States had some pain with Unna boots at first due to tightness, none now.  No fevers, chills, nausea, vomiting.  Has been off Doxycycline for over 3 weeks.    2.  Hypotension:  Only taking 1 Lasix daily.  No dizziness/chest pain, shortness of breath, lightheadedness.  No falls.     Review of Systems  See HPI above for review of systems.        Objective:   Physical Exam Gen:  Alert, cooperative patient who appears stated age in no acute distress.  Vital signs reviewed. Cardiac:  Regular rate and rhythm without murmur auscultated.  Good S1/S2. Pulm:  Clear to auscultation bilaterally with good air movement.  No wheezes or rales noted.   Ext:  Chronic venous stasis changes noted.  Well-healing, pink granulation tissue noted pre-tibially BL.  No current drainage or open wounds.        Assessment & Plan:

## 2011-08-25 NOTE — Assessment & Plan Note (Signed)
Still very difficult balance b/n CHF regimen as well as preventing falls due to hypotension in this elderly gentleman on chronic Coumadin.  Continue current medication regimen.

## 2011-08-25 NOTE — Assessment & Plan Note (Signed)
Much improved today.   Very well healed.  No need for further Unna boots. Recommended continued compression stocking use at home, only intermittent use in past.

## 2011-08-26 ENCOUNTER — Ambulatory Visit: Payer: Medicare Other

## 2011-09-08 ENCOUNTER — Ambulatory Visit (INDEPENDENT_AMBULATORY_CARE_PROVIDER_SITE_OTHER): Payer: Medicare Other | Admitting: *Deleted

## 2011-09-08 ENCOUNTER — Ambulatory Visit (INDEPENDENT_AMBULATORY_CARE_PROVIDER_SITE_OTHER): Payer: Medicare Other | Admitting: Family Medicine

## 2011-09-08 ENCOUNTER — Encounter: Payer: Self-pay | Admitting: Family Medicine

## 2011-09-08 VITALS — BP 107/51 | HR 63 | Ht 68.0 in | Wt 297.0 lb

## 2011-09-08 DIAGNOSIS — Z86718 Personal history of other venous thrombosis and embolism: Secondary | ICD-10-CM

## 2011-09-08 DIAGNOSIS — M7989 Other specified soft tissue disorders: Secondary | ICD-10-CM

## 2011-09-08 DIAGNOSIS — Z7901 Long term (current) use of anticoagulants: Secondary | ICD-10-CM

## 2011-09-08 NOTE — Patient Instructions (Signed)
Very nice to meet you. We are going to wrap her leg again but it does not seem infected. If you do get fevers or chills please call or come back in and be seen. I am also going to increase her Lasix to 80 mg twice a day for the next 3 days. At that time go back to your regular dose of 40 mg twice a day. I have you come back on Wednesday and see Dr. Gwendolyn Grant at the scheduled appointment above.

## 2011-09-08 NOTE — Assessment & Plan Note (Signed)
Patient has had this problem chronically for some time. Do not think patient is wearing his stockings on regular basis which makes the swelling worse. Patient has a history congestive heart failure and does have some bibasilar crackles which makes me concerned for potential decompensation. Patient is taking Lasix 40 mg twice a day on regular basis. At this time we increased to 80 mg twice a day to try to decrease some of the fluid. Patient also did not show any signs of infection but did decide to do an Radio broadcast assistant for continued care. Patient is going to be following up with primary care provider in 2 days for recheck. Likely will need Unna boot for much longer duration.  With patient having as much swelling in been on Lasix for such a significant amount of time would consider potential changed to torsemide but will let primary care provider make that change if they think it is beneficial. The patient red flags and when to seek medical attention INR of 1.8 today making it very unlikely that patient has a DVT and did not do further workup.

## 2011-09-08 NOTE — Progress Notes (Signed)
Patient ID: [name], male   DOB: [birth year], 71 y.o.   MRN: [id number]  Subjective:    Patient ID: [name], male    DOB: Jun 21, 1940, 71 y.o.   MRN: [id number]  HPI left leg swelling  Patient has been seen for left leg swelling previously. Patient has been treated with Unna boots as well as oral antibiotics over the course of the last 3 weeks. Patient initially discontinued this uses approximately one week ago but now seems that the swelling is getting worse again. Patient states that it seems to be on the left leg but denies much pain but does have continued pain and weeping.  No pus drainage. Patient denies any fevers or chills. Patient states the swelling to be worse but the erythema does not. Patient though does complain of some increasing shortness of breath more than his baseline.  I have reviewed patient's  PMH, FH, and Social history and Medications as related to this visit. Has history of PAD and toe amputation, venous insufficiency, CAD, severe peripheral neuropathy, and DM.  Has needed wound care and Unna boots in the past.  Has tolerated Unna boots well despite PAD.  Review of Systems See HPI    Objective:   Physical Exam GEN: Alert & Oriented, No acute distress CV:  Regular Rate & Rhythm, no murmur distant Respiratory:  Normal work of breathing, mild wheeze and crackles bibasilar. Abd:  + BS, soft, no tenderness to palpation Ext: 2+ pitting edema.   left leg with several weeping sites anteriorly and posteriorly.  Some erythema, mostly chronic venous stasis changes. No pus discharge minimally tender to palpation.        Assessment & Plan:

## 2011-09-10 ENCOUNTER — Encounter: Payer: Self-pay | Admitting: Family Medicine

## 2011-09-10 ENCOUNTER — Ambulatory Visit (INDEPENDENT_AMBULATORY_CARE_PROVIDER_SITE_OTHER): Payer: Medicare Other | Admitting: Family Medicine

## 2011-09-10 VITALS — BP 105/60 | HR 72 | Temp 97.9°F | Ht 68.0 in | Wt 293.0 lb

## 2011-09-10 DIAGNOSIS — I798 Other disorders of arteries, arterioles and capillaries in diseases classified elsewhere: Secondary | ICD-10-CM

## 2011-09-10 DIAGNOSIS — E1159 Type 2 diabetes mellitus with other circulatory complications: Secondary | ICD-10-CM

## 2011-09-10 DIAGNOSIS — R600 Localized edema: Secondary | ICD-10-CM

## 2011-09-10 DIAGNOSIS — R609 Edema, unspecified: Secondary | ICD-10-CM

## 2011-09-10 DIAGNOSIS — M7989 Other specified soft tissue disorders: Secondary | ICD-10-CM

## 2011-09-10 MED ORDER — TORSEMIDE 20 MG PO TABS: 20.0000 mg | ORAL_TABLET | Freq: Every day | ORAL | Status: DC

## 2011-09-10 MED ORDER — ACETAMINOPHEN-CODEINE #3 300-30 MG PO TABS: 1.0000 | ORAL_TABLET | Freq: Four times a day (QID) | ORAL | Status: DC | PRN

## 2011-09-10 NOTE — Progress Notes (Signed)
  Subjective:    Patient ID: [name], male    DOB: [birth year], 71 y.o.   MRN: [id number]  HPI 1.  FU for leg edema:  Has had recent increase in BL LE edema.  Seen here on Monday two days prior, Unna boot placed, had weeping wounds but no signs of infection at that time.  He has since been wearing the [employer].  Some drainage through bandages.  Tightness form compression but no pain or fevers/chills.  No nausea/vomiting.  On Lasix 80 mg daily without dyspnea or SOB>  2.  Hypoglycemia:  CBG 40 this AM, had tomato, bacon sandwich and it came up to 160.  States when low he will eat something to bring it back up.  First time this has happened in months according to patient.  Felt malaise and lightheadedness when happened.   Review of Systems See HPI above for review of systems.       Objective:   Physical Exam Gen:  Alert, cooperative patient who appears stated age in no acute distress.  Vital signs reviewed. Cardiac:  Regular rate and rhythm without murmur auscultated.  Good S1/S2. Pulm:  Clear to auscultation bilaterally with good air movement.  No wheezes or rales noted.   Ext:  RLE with edema noted.  LLE with several weeping wounds, largest about 3 x 4 cm in diameter on anterior aspect of shin.  No erythema or tenderness.  No purulence or red streaking.        Assessment & Plan:

## 2011-09-10 NOTE — Patient Instructions (Addendum)
Stop taking the Lasix. Start the Torsemide 1 pill a day.   Come back on Friday so we can see your leg again.   Tylenol #3 refill.   Come back and see in 1 week so we can talk about diabetes at that time.

## 2011-09-12 ENCOUNTER — Encounter: Payer: Self-pay | Admitting: Family Medicine

## 2011-09-12 ENCOUNTER — Ambulatory Visit (INDEPENDENT_AMBULATORY_CARE_PROVIDER_SITE_OTHER): Payer: Medicare Other | Admitting: Family Medicine

## 2011-09-12 VITALS — BP 102/56 | HR 68 | Temp 97.4°F | Resp 22 | Wt 301.5 lb

## 2011-09-12 DIAGNOSIS — I10 Essential (primary) hypertension: Secondary | ICD-10-CM

## 2011-09-12 DIAGNOSIS — I8310 Varicose veins of unspecified lower extremity with inflammation: Secondary | ICD-10-CM

## 2011-09-12 DIAGNOSIS — I872 Venous insufficiency (chronic) (peripheral): Secondary | ICD-10-CM

## 2011-09-12 DIAGNOSIS — T368X5A Adverse effect of other systemic antibiotics, initial encounter: Secondary | ICD-10-CM

## 2011-09-12 LAB — BASIC METABOLIC PANEL
CO2: 26 mEq/L (ref 19–32)
Chloride: 103 mEq/L (ref 96–112)
Potassium: 4.6 mEq/L (ref 3.5–5.3)
Sodium: 139 mEq/L (ref 135–145)

## 2011-09-12 NOTE — Assessment & Plan Note (Signed)
Leg improved but there is still some ulcers present. We'll wrapped with Unna boot times one more week.

## 2011-09-12 NOTE — Assessment & Plan Note (Signed)
Replaced Unna boot.  No signs of infection. Switched to Torsemide for better absorption of diuretic. FU on Friday for wound check.

## 2011-09-12 NOTE — Patient Instructions (Addendum)
You have done a good job taking care of your leg We will keep you in an unna boot for one more week Please come back and be checked then

## 2011-09-12 NOTE — Progress Notes (Signed)
  Subjective:    Patient ID: [name], male    DOB: 02-Mar-1941, 71 y.o.   MRN: [id number]  HPI  [name] presents for reevaluation of leg edema. He has been wearing the [employer] without difficulty. He is also taking the torsemide. he denies chest pain, shortness breath, dizziness.  He does not have any pain in his leg.  Review of Systems See above    Objective:   Physical Exam  Vital signs reviewed General appearance - alert, well appearing, and in no distress Heart - normal rate, regular rhythm, normal S1, S2, no murmurs, rubs, clicks or gallops Chest - clear to auscultation, no wheezes, rales or rhonchi, symmetric air entry, no tachypnea, retractions or cyanosis EXT-chronic venous stasis changes. Significant interval improvement in left leg edema. There is still several open ulcers on leg. Dorsalis pedis pulse present on left leg      Assessment & Plan:

## 2011-09-12 NOTE — Assessment & Plan Note (Signed)
Hypoglycemic episodes.  Resolved with food.  First time in quite some time.  FU about 1 week after he returns on Friday for wound check and we will go over diabetes at that time.  He is to bring his medications with him.

## 2011-09-12 NOTE — Assessment & Plan Note (Signed)
Blood pressure and low normal today. Recheck BMET today after increasing torsemide,.

## 2011-09-19 ENCOUNTER — Ambulatory Visit (INDEPENDENT_AMBULATORY_CARE_PROVIDER_SITE_OTHER): Payer: Medicare Other | Admitting: *Deleted

## 2011-09-19 ENCOUNTER — Ambulatory Visit (INDEPENDENT_AMBULATORY_CARE_PROVIDER_SITE_OTHER): Payer: Medicare Other | Admitting: Family Medicine

## 2011-09-19 ENCOUNTER — Encounter: Payer: Self-pay | Admitting: Family Medicine

## 2011-09-19 VITALS — BP 121/73 | HR 62 | Temp 97.4°F | Wt 305.0 lb

## 2011-09-19 DIAGNOSIS — Z7901 Long term (current) use of anticoagulants: Secondary | ICD-10-CM

## 2011-09-19 DIAGNOSIS — R609 Edema, unspecified: Secondary | ICD-10-CM

## 2011-09-19 DIAGNOSIS — Z86718 Personal history of other venous thrombosis and embolism: Secondary | ICD-10-CM

## 2011-09-19 DIAGNOSIS — R600 Localized edema: Secondary | ICD-10-CM

## 2011-09-19 MED ORDER — WARFARIN SODIUM 5 MG PO TABS: 5.0000 mg | ORAL_TABLET | Freq: Every day | ORAL | Status: DC

## 2011-09-19 MED ORDER — LISINOPRIL 10 MG PO TABS: 10.0000 mg | ORAL_TABLET | Freq: Every day | ORAL | Status: DC

## 2011-09-19 NOTE — Progress Notes (Signed)
Patient ID: [name], male   DOB: 24-Mar-1941, 70 y.o.   MRN: [id number] [name] is a 71 y.o. male who presents to [facility] today for follow up for LE edema  1.  LE edema:  Worsening.  Taking 20 mg Torsemide without much relief.  States he's going to the bathroom often but the swelling is gradually worsening.  Left leg is doing better, slightly weeping but no longer with any open ulcers.  Now with 1 open ulcer on Right leg and open weeping.     The following portions of the patient's history were reviewed and updated as appropriate: allergies, current medications, past medical history, family and social history, and problem list.  Patient is a nonsmoker.   ROS as above otherwise neg. No Chest pain, palpitations, SOB, Fever, Chills, Abd pain, N/V/D.  Medications reviewed. Current Outpatient Prescriptions  Medication Sig Dispense Refill  . acetaminophen-codeine (TYLENOL #3) 300-30 MG per tablet Take 1 tablet by mouth every 6 (six) hours as needed. For pain.  60 tablet  1  . albuterol (PROVENTIL HFA;VENTOLIN HFA) 108 (90 BASE) MCG/ACT inhaler Inhale 2 puffs into the lungs every 4 (four) hours as needed for wheezing.  1 Inhaler  1  . albuterol (PROVENTIL) (2.5 MG/3ML) 0.083% nebulizer solution Take 3 mLs (2.5 mg total) by nebulization every 6 (six) hours as needed for wheezing.  150 mL  1  . aspirin (BABY ASPIRIN) 81 MG chewable tablet Chew 81 mg by mouth daily.       . bisoprolol (ZEBETA) 5 MG tablet Take 1 tablet (5 mg total) by mouth 2 (two) times daily.  90 tablet  2  . Fluticasone-Salmeterol (ADVAIR) 100-50 MCG/DOSE AEPB Inhale 1 puff into the lungs 2 (two) times daily.  1 each  3  . furosemide (LASIX) 80 MG tablet Take 40 mg by mouth 2 (two) times daily.      . insulin NPH (HUMULIN N) 100 UNIT/ML injection Inject 70 Units into the skin 2 (two) times daily.  10 mL  0  . lisinopril (PRINIVIL,ZESTRIL) 10 MG tablet Take 1 tablet (10 mg total) by mouth daily.  90 tablet  2  . nitroGLYCERIN  (NITROSTAT) 0.4 MG SL tablet Place 1 tablet (0.4 mg total) under the tongue as needed. For chest pains  30 tablet  1  . simvastatin (ZOCOR) 80 MG tablet Take 1 tablet (80 mg total) by mouth at bedtime.  90 tablet  2  . Tamsulosin HCl (FLOMAX) 0.4 MG CAPS Take 1 capsule (0.4 mg total) by mouth daily after breakfast.  90 capsule  2  . torsemide (DEMADEX) 20 MG tablet Take 1 tablet (20 mg total) by mouth daily.  30 tablet  0  . venlafaxine (EFFEXOR-XR) 37.5 MG 24 hr capsule Take 37.5 mg by mouth daily.      Marland Kitchen warfarin (COUMADIN) 5 MG tablet Take 1-1.5 tablets (5-7.5 mg total) by mouth daily. Take 5mg  every day, except take 7.5mg  on Mondays.  60 tablet  3  . DISCONTD: lisinopril (PRINIVIL,ZESTRIL) 10 MG tablet Take 1 tablet (10 mg total) by mouth daily.  90 tablet  2  . DISCONTD: warfarin (COUMADIN) 5 MG tablet Take 5-7.5 mg by mouth daily. Take 5mg  every day, except take 7.5mg  on Mondays.        Exam:  BP 121/73  Pulse 62  Temp 97.4 F (36.3 C) (Oral)  Wt 305 lb (138.347 kg)  SpO2 92% Gen: Well NAD HEENT: EOMI,  MMM Lungs: Very  minimal wheeze BL bases.  No rales noted Heart: RRR no MRG Abd: Obese Ext:  BL LE's with +3 pitting edema to knees. Left leg ulcers have headed.  Right leg now with 1 x 2 cm open weeping wound anterior aspect of shin.  No bleeding, no purulent material.    No results found for this or any previous visit (from the past 72 hour(s)).

## 2011-09-19 NOTE — Patient Instructions (Addendum)
Take 2 of the fluid pills for now.  We may need to keep it at this dose. Wrap your leg with the gauze and ACE bandage like we talked. You have to wrap it tight like a compression stocking.   Come back Tuesday or Wednesday so we can look at your leg.  If it's still got an open ulcer we may need to put on the Unna boot again.

## 2011-09-20 DIAGNOSIS — R600 Localized edema: Secondary | ICD-10-CM | POA: Insufficient documentation

## 2011-09-20 NOTE — Assessment & Plan Note (Signed)
Patient very resistant to placing [employer] today. Discussed this, plan is to use TIGHT Ace bandage wrapping over gauze placed on Right leg wound. Left leg wound has healed appropriately.  Increase Torsemide to 40 mg daily for at least next week. FU on Tues/Wed of that week, if not healing will need to place Unna boot at that time. Patient amenable to this.    No rales or signs of pulmonary compromise/fluid overload today

## 2011-09-22 ENCOUNTER — Ambulatory Visit: Payer: Medicare Other

## 2011-09-23 ENCOUNTER — Encounter: Payer: Self-pay | Admitting: Family Medicine

## 2011-09-23 ENCOUNTER — Ambulatory Visit (INDEPENDENT_AMBULATORY_CARE_PROVIDER_SITE_OTHER): Payer: Medicare Other | Admitting: Family Medicine

## 2011-09-23 VITALS — BP 106/80 | HR 80 | Temp 98.2°F | Wt 303.0 lb

## 2011-09-23 DIAGNOSIS — R609 Edema, unspecified: Secondary | ICD-10-CM

## 2011-09-23 DIAGNOSIS — R600 Localized edema: Secondary | ICD-10-CM

## 2011-09-23 LAB — BASIC METABOLIC PANEL
BUN: 17 mg/dL (ref 6–23)
Chloride: 104 mEq/L (ref 96–112)
Glucose, Bld: 227 mg/dL — ABNORMAL HIGH (ref 70–99)
Potassium: 4.4 mEq/L (ref 3.5–5.3)

## 2011-09-23 MED ORDER — TORSEMIDE 20 MG PO TABS: 40.0000 mg | ORAL_TABLET | Freq: Every day | ORAL | Status: DC

## 2011-09-23 NOTE — Assessment & Plan Note (Addendum)
Continued LE edema and weeping despite ACE bandage and increased torsemide although somewhat better.  Will place Unna boots bilaterally, continue increased dose of torsemide.  Will check BMET today.    Will follow-up in 1 week with PCP to remove Unna boots and recheck.

## 2011-09-23 NOTE — Progress Notes (Signed)
  Subjective:    Patient ID: [name], male    DOB: [birth year], 71 y.o.   MRN: [id number]  HPI Patient here for follow-up on 09/19/11 appt with his PCP for LE edema  Patient opted against UNNA boots at the time.  Changes made were to increase torsemide from 20 to 40 mg, wrap legs with Ace bandages.  Patient reports swelling has decreased.  Continues lower leg tightness and pain bilaterally R>L.  Bi fever, chills.  Reports no increased dyspnea.  I have reviewed patient's PMH, FH, and Social history and Medications as related to this visit.  Has history of PAD and toe amputation, venous insufficiency, CAD, severe peripheral neuropathy, and DM.   Review of Systems See HPI    Objective:   Physical Exam GEN: Alert & Oriented, No acute distress, chronically ill appearing.  In good spirits. CV:  Regular Rate & Rhythm, no murmur Respiratory:  Normal work of breathing, crackles in bases. Abd:  + BS, soft, no tenderness to palpation Ext: 2+ edema in lower legs bilaterally.  Chronic venous skin changes.  Weeping in both lower legs.  No cellultis        Assessment & Plan:

## 2011-09-23 NOTE — Patient Instructions (Addendum)
Keep taking Torsemide 40  Will check your labs today to make sure this is a safe dose for you  Follow-up in 1 week with Dr. Gwendolyn Grant

## 2011-09-30 ENCOUNTER — Ambulatory Visit (INDEPENDENT_AMBULATORY_CARE_PROVIDER_SITE_OTHER): Payer: Medicare Other | Admitting: Family Medicine

## 2011-09-30 VITALS — BP 113/53 | HR 62 | Temp 97.9°F | Ht 68.0 in | Wt 300.0 lb

## 2011-09-30 DIAGNOSIS — R600 Localized edema: Secondary | ICD-10-CM

## 2011-09-30 DIAGNOSIS — R609 Edema, unspecified: Secondary | ICD-10-CM

## 2011-09-30 DIAGNOSIS — E1159 Type 2 diabetes mellitus with other circulatory complications: Secondary | ICD-10-CM

## 2011-09-30 DIAGNOSIS — I798 Other disorders of arteries, arterioles and capillaries in diseases classified elsewhere: Secondary | ICD-10-CM

## 2011-09-30 NOTE — Patient Instructions (Signed)
It was good to see you today. Use the compression stockings daily for the next 2 weeks. Use the non-stick bandages on your wound.  Come back and see me in 2 weeks.  Bring your insulin if you have it at that time.

## 2011-10-02 ENCOUNTER — Ambulatory Visit (INDEPENDENT_AMBULATORY_CARE_PROVIDER_SITE_OTHER): Payer: Medicare Other | Admitting: *Deleted

## 2011-10-02 DIAGNOSIS — Z86718 Personal history of other venous thrombosis and embolism: Secondary | ICD-10-CM

## 2011-10-02 DIAGNOSIS — Z7901 Long term (current) use of anticoagulants: Secondary | ICD-10-CM

## 2011-10-02 NOTE — Assessment & Plan Note (Signed)
Weeping and LE edema much improved.  Continue increased dose of Torsemide.   Placed bandage today over small remaining wound today.  Due to marked improvement hold off on Unna boots currently.   Greatly stressed need for home compression stocking compliance.  Patient expressed understanding.

## 2011-10-02 NOTE — Progress Notes (Signed)
  Subjective:    Patient ID: [name], male    DOB: 1941-01-23, 71 y.o.   MRN: [id number]  HPI  1.  Wound check:  Patient seen here several days prior and had Unna boots placed due to worsening of his LE edema, weeping, and recurrence of wounds.  Much improved since that time.  No pain.  Has not noted any weeping or seepage through his wrappings.    No chest pain, fevers, chills, dyspnea.    Review of Systems See HPI above for review of systems.       Objective:   Physical Exam  Gen:  Alert, cooperative patient who appears stated age in no acute distress.  Vital signs reviewed. Ext:  +2 edema BL.  Wounds have completely healed on Left leg.  0.5 cm in diameter wound that is healing well with granulation tissue noted on anterior aspect of Right shin.  No other wounds noted Right shin. Nontender BL Pulm:  Clear to auscultation bilaterally with good air movement.  No wheezes or rales noted.         Assessment & Plan:

## 2011-10-02 NOTE — Assessment & Plan Note (Signed)
Patient did not bring insulin today. FU at next appt in 2 weeks, instructed to bring at that time.

## 2011-10-03 ENCOUNTER — Ambulatory Visit: Payer: Medicare Other

## 2011-10-10 ENCOUNTER — Encounter: Payer: Self-pay | Admitting: Neurosurgery

## 2011-10-13 ENCOUNTER — Ambulatory Visit (INDEPENDENT_AMBULATORY_CARE_PROVIDER_SITE_OTHER): Payer: Medicare Other | Admitting: *Deleted

## 2011-10-13 ENCOUNTER — Encounter (INDEPENDENT_AMBULATORY_CARE_PROVIDER_SITE_OTHER): Payer: Medicare Other | Admitting: *Deleted

## 2011-10-13 ENCOUNTER — Encounter: Payer: Self-pay | Admitting: Neurosurgery

## 2011-10-13 ENCOUNTER — Ambulatory Visit (INDEPENDENT_AMBULATORY_CARE_PROVIDER_SITE_OTHER): Payer: Medicare Other | Admitting: Neurosurgery

## 2011-10-13 VITALS — BP 117/54 | HR 97 | Resp 20 | Ht 68.0 in | Wt 301.9 lb

## 2011-10-13 DIAGNOSIS — I739 Peripheral vascular disease, unspecified: Secondary | ICD-10-CM

## 2011-10-13 DIAGNOSIS — Z48812 Encounter for surgical aftercare following surgery on the circulatory system: Secondary | ICD-10-CM

## 2011-10-13 NOTE — Progress Notes (Signed)
[facility] [place] PAD/PVD Office Note  CC: Annual ABIs with left lower extremity duplex Referring Physician: Brabham  History of Present Illness: 71 year old male patient of Dr. 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 The patient states his legs do "hurt" however he does not want any further diagnostics or interventions at this point. The patient has obvious PAD as well as PVD and states his primary care "wraps his legs" for several weeks at a time intermittently which seems to help. Because of the PVD the patient does have discoloration with various small ulcerations on his lower extremities, again, he states his primary care takes care of this problem. The patient states he did fall a few days ago getting out of his [employer] but other than being sore he has no complaints. Past Medical History  Diagnosis Date  . Depression   . Diabetes mellitus   . COPD (chronic obstructive pulmonary disease)   . Adhesive capsulitis   . MRSA bacteremia     2011 - possible endocarditis, received 6 weeks IV treatment  . Hyperlipidemia   . Anxiety   . GERD (gastroesophageal reflux disease)   . CAD (coronary artery disease)   . 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 CVA (cerebral infarction) Questionable history  . Diverticulosis   . Osteomyelitis 2012    Sternoclavicular joint   . Chronic back pain   . Angina   . Myocardial infarction   . CHF (congestive heart failure)   . Shortness of breath   . Sleep apnea   . Chronic kidney disease     hx of BPH  . Neuromuscular disorder     HX of diabetic periferal neuropathy  . Hypertension   . Peripheral vascular disease     ROS: [x]  Positive   [ ]  Denies    General: [ ]  Weight loss, [ ]  Fever, [ ]  chills Neurologic: [ ]  Dizziness, [ ]  Blackouts, [ ]  Seizure [ ]   Stroke, [ ]  "Mini stroke", [ ]  Slurred speech, [ ]  Temporary blindness; [x ] weakness in arms or legs, [ ]  Hoarseness Cardiac: [ ]  Chest pain/pressure, [ ]  Shortness of breath at rest [ x] Shortness of breath with exertion, [ ]  Atrial fibrillation or irregular heartbeat Vascular: [x ] Pain in legs with walking, [ ]  Pain in legs at rest, [ ]  Pain in legs at night,  [ ]  Non-healing ulcer, [ ]  Blood clot in vein/DVT,   Pulmonary: [ ]  Home oxygen, [ ]  Productive cough, [ ]  Coughing up blood, [ ]  Asthma,  [ ]  Wheezing Musculoskeletal:  [ ]  Arthritis, [ ]  Low back pain, [ ]  Joint pain Hematologic: [ ]  Easy Bruising, [ ]  Anemia; [ ]  Hepatitis Gastrointestinal: [ ]  Blood in stool, [ ]  Gastroesophageal Reflux/heartburn, [ ]  Trouble swallowing Urinary: [ ]  chronic Kidney disease, [ ]  on HD - [ ]  MWF or [ ]  TTHS, [ ]  Burning with urination, [ ]  Difficulty urinating Skin: [ ]  Rashes, [ ]  Wounds Psychological: [ ]  Anxiety, [ ]  Depression   Social History History  Substance Use Topics  . Smoking status: Former Smoker    Quit date: 06/19/2000  . Smokeless tobacco: Never Used  . Alcohol Use: No    Family History Family History  Problem Relation Age of Onset  .  Heart disease Father     Allergies  Allergen Reactions  . Diazepam Anxiety    REACTION: makes patient cry    Current Outpatient Prescriptions  Medication Sig Dispense Refill  . acetaminophen-codeine (TYLENOL #3) 300-30 MG per tablet Take 1 tablet by mouth every 6 (six) hours as needed. For pain.  60 tablet  1  . albuterol (PROVENTIL HFA;VENTOLIN HFA) 108 (90 BASE) MCG/ACT inhaler Inhale 2 puffs into the lungs every 4 (four) hours as needed for wheezing.  1 Inhaler  1  . albuterol (PROVENTIL) (2.5 MG/3ML) 0.083% nebulizer solution Take 3 mLs (2.5 mg total) by nebulization every 6 (six) hours as needed for wheezing.  150 mL  1  . aspirin (BABY ASPIRIN) 81 MG chewable tablet Chew 81 mg by mouth daily.       . bisoprolol (ZEBETA) 5 MG  tablet Take 1 tablet (5 mg total) by mouth 2 (two) times daily.  90 tablet  2  . Fluticasone-Salmeterol (ADVAIR) 100-50 MCG/DOSE AEPB Inhale 1 puff into the lungs 2 (two) times daily.  1 each  3  . furosemide (LASIX) 80 MG tablet Take 40 mg by mouth 2 (two) times daily.      . insulin NPH (HUMULIN N) 100 UNIT/ML injection Inject 70 Units into the skin 2 (two) times daily.  10 mL  0  . lisinopril (PRINIVIL,ZESTRIL) 10 MG tablet Take 1 tablet (10 mg total) by mouth daily.  90 tablet  2  . nitroGLYCERIN (NITROSTAT) 0.4 MG SL tablet Place 1 tablet (0.4 mg total) under the tongue as needed. For chest pains  30 tablet  1  . simvastatin (ZOCOR) 80 MG tablet Take 1 tablet (80 mg total) by mouth at bedtime.  90 tablet  2  . Tamsulosin HCl (FLOMAX) 0.4 MG CAPS Take 1 capsule (0.4 mg total) by mouth daily after breakfast.  90 capsule  2  . torsemide (DEMADEX) 20 MG tablet Take 2 tablets (40 mg total) by mouth daily.  60 tablet  0  . venlafaxine (EFFEXOR-XR) 37.5 MG 24 hr capsule Take 37.5 mg by mouth daily.      Marland Kitchen warfarin (COUMADIN) 5 MG tablet Take 1-1.5 tablets (5-7.5 mg total) by mouth daily. Take 5mg  every day, except take 7.5mg  on Mondays.  60 tablet  3    Physical Examination  Filed Vitals:   10/13/11 1121  BP: 117/54  Pulse: 97  Resp: 20    Body mass index is 45.90 kg/(m^2).  General:  WDWN in NAD Gait: Normal HEENT: WNL Eyes: Pupils equal Pulmonary: normal non-labored breathing , without Rales, rhonchi,  wheezing Cardiac: RRR, without  Murmurs, rubs or gallops; No carotid bruits Abdomen: soft, NT, no masses Skin: no rashes, ulcers noted Vascular Exam/Pulses: Lower extremity pulses are not palpable bilaterally, no carotid bruits are heard  Extremities without ischemic changes, no Gangrene , no cellulitis; no open wounds;  Musculoskeletal: no muscle wasting or atrophy  Neurologic: A&O X 3; Appropriate Affect ; SENSATION: normal; MOTOR FUNCTION:  moving all extremities equally. Speech  is fluent/normal  Non-Invasive Vascular Imaging: ABIs today are 1.04 and biphasic to triphasic on the right, 0.65 on monophasic on the left, duplex bypass graft shows a patent left lower extremity graft  ASSESSMENT/PLAN: Is a patient with a long and fairly complicated history of vascular disease. The patient declines any further diagnostics intervention. We will see the patient on an annual basis for ABIs and graft duplex, he knows to call the office if he has  problems in the interim. The patient's questions were encouraged and answered. He is in agreement with this plan.  Lauree Chandler ANP X.  Clinic M.D.: Myra Gianotti

## 2011-10-14 ENCOUNTER — Ambulatory Visit (INDEPENDENT_AMBULATORY_CARE_PROVIDER_SITE_OTHER): Payer: Medicare Other | Admitting: *Deleted

## 2011-10-14 ENCOUNTER — Encounter: Payer: Self-pay | Admitting: Family Medicine

## 2011-10-14 ENCOUNTER — Ambulatory Visit (INDEPENDENT_AMBULATORY_CARE_PROVIDER_SITE_OTHER): Payer: Medicare Other | Admitting: Family Medicine

## 2011-10-14 VITALS — BP 122/64 | HR 78 | Temp 97.8°F | Wt 301.0 lb

## 2011-10-14 DIAGNOSIS — E1159 Type 2 diabetes mellitus with other circulatory complications: Secondary | ICD-10-CM

## 2011-10-14 DIAGNOSIS — Z7901 Long term (current) use of anticoagulants: Secondary | ICD-10-CM

## 2011-10-14 DIAGNOSIS — I82419 Acute embolism and thrombosis of unspecified femoral vein: Secondary | ICD-10-CM

## 2011-10-14 DIAGNOSIS — M25562 Pain in left knee: Secondary | ICD-10-CM

## 2011-10-14 DIAGNOSIS — I824Y9 Acute embolism and thrombosis of unspecified deep veins of unspecified proximal lower extremity: Secondary | ICD-10-CM

## 2011-10-14 DIAGNOSIS — M25561 Pain in right knee: Secondary | ICD-10-CM

## 2011-10-14 DIAGNOSIS — M25569 Pain in unspecified knee: Secondary | ICD-10-CM

## 2011-10-14 DIAGNOSIS — Z86718 Personal history of other venous thrombosis and embolism: Secondary | ICD-10-CM

## 2011-10-14 DIAGNOSIS — I798 Other disorders of arteries, arterioles and capillaries in diseases classified elsewhere: Secondary | ICD-10-CM

## 2011-10-14 MED ORDER — TRAMADOL HCL 50 MG PO TABS: 100.0000 mg | ORAL_TABLET | Freq: Three times a day (TID) | ORAL | Status: AC | PRN

## 2011-10-14 NOTE — Assessment & Plan Note (Signed)
Discussed having knees x-rayed, patient declined. No red flags based on history of physical today.  Joints appear stable BL.  FU in 1 week to make sure he is still doing well and to ensure LE venous ulcers have not recurred. If still having pain at that time, will push harder for him to have x-rays, although he typically declines any intervention after previous falls. Tramadol for pain relief.   ACE compression applied to Right knee (side with more pain) today. PT/INR as last INR was 2.6 although exam not consistent with bleeding.

## 2011-10-14 NOTE — Patient Instructions (Signed)
Keep your knee wrapped for the next week.  You can change it daily.    Come back and see me next week.    Bring your diabetes medicines at that time.  If you start having worsening pain don't wait until next week.    I will give you some Tramadol for pain relief.

## 2011-10-14 NOTE — Assessment & Plan Note (Signed)
Discussed again with him that he needs to bring diabetic medications next week when he returns.   A1C next week.

## 2011-10-14 NOTE — Progress Notes (Signed)
  Subjective:    Patient ID: [name], male    DOB: [birth year], 71 y.o.   MRN: [id number]  HPI 1.  Fall:  Patient had a fall on Saturday. He was stepping out of a stand and lost his balance. He fell forward onto his knees. He is able to get up and walk after this. He's had some residual pain in his bilateral knees where he fell and scraped them. He is able to ambulate at his baseline with his cane. He has not noted any increase lying his knees. No bruising. It is most painful when he stands for prolonged period of time as worse in his right knee. Pain is somewhat relieved by Tylenol #3.  #2. Diabetes: Patient one more did not bring in his diabetes medications. We'll review this next week when he returns for recheck of his knees..  No polyuria/polydipsia.   Review of Systems See HPI above for review of systems.       Objective:   Physical Exam Gen:  Alert, cooperative patient who appears stated age in no acute distress.  Vital signs reviewed. Cardiac:  Regular rate and rhythm without murmur auscultated.  Good S1/S2. Pulm:  Clear to auscultation bilaterally with good air movement.  No wheezes or rales noted.   MSK:  Healing abrasions noted BL knees, also on lateral aspect of Right shin.  Right knee TTP along medial joint line, nontender on patella.  Left knee tender suprapatellar bursa, not tender along patella.  No joint effusion noted, no warmth or redness BL.  Ant/post drawer negative.  No joint laxity noted.  Patient able to ambulate without limping and at his usual baseline today in clinic.   Ext:  +3 edema BL to knees.  Previous venous ulcers have healed.         Assessment & Plan:

## 2011-10-20 NOTE — Procedures (Unsigned)
BYPASS GRAFT EVALUATION  INDICATION:  Follow up bypass graft placement.  HISTORY: Diabetes:  Yes. Cardiac:  CABG, MI. Hypertension:  Yes. Smoking:  Previously. 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  SINGLE LEVEL ARTERIAL EXAM                              RIGHT              LEFT Brachial: Anterior tibial: Posterior tibial: Peroneal: Ankle/brachial index:        1.04               0.65  PREVIOUS ABI:  Date: 09/30/2010  RIGHT:  1.01  LEFT:  0.64  LOWER EXTREMITY BYPASS GRAFT DUPLEX EXAM:  DUPLEX:  Patent left femoral-to-popliteal bypass graft with velocity measurements noted on the following worksheet.  IMPRESSION: 1. Right ankle brachial index is within normal limits and stable from     the previous exam. 2. Left ankle brachial index is suggestive of moderate arterial     disease and also remains stable from the previous exam 3. Patent left femoral-to-popliteal bypass graft.  ___________________________________________ V. Charlena Cross, MD  EM/MEDQ  D:  10/13/2011  T:  10/13/2011  Job:  [id number]

## 2011-10-28 ENCOUNTER — Ambulatory Visit (INDEPENDENT_AMBULATORY_CARE_PROVIDER_SITE_OTHER): Payer: Medicare Other | Admitting: *Deleted

## 2011-10-28 DIAGNOSIS — Z86718 Personal history of other venous thrombosis and embolism: Secondary | ICD-10-CM

## 2011-10-28 DIAGNOSIS — Z7901 Long term (current) use of anticoagulants: Secondary | ICD-10-CM

## 2011-10-28 LAB — POCT INR: INR: 1.5

## 2011-11-02 ENCOUNTER — Inpatient Hospital Stay (HOSPITAL_COMMUNITY)
Admission: EM | Admit: 2011-11-02 | Discharge: 2011-11-04 | DRG: 593 | Disposition: A | Discharge status: Home / Self Care | Payer: Medicare Other | Attending: Family Medicine | Admitting: Family Medicine

## 2011-11-02 ENCOUNTER — Encounter (HOSPITAL_COMMUNITY): Payer: Self-pay | Admitting: *Deleted

## 2011-11-02 DIAGNOSIS — M869 Osteomyelitis, unspecified: Secondary | ICD-10-CM

## 2011-11-02 DIAGNOSIS — E785 Hyperlipidemia, unspecified: Secondary | ICD-10-CM | POA: Diagnosis present

## 2011-11-02 DIAGNOSIS — M7500 Adhesive capsulitis of unspecified shoulder: Secondary | ICD-10-CM

## 2011-11-02 DIAGNOSIS — F3289 Other specified depressive episodes: Secondary | ICD-10-CM

## 2011-11-02 DIAGNOSIS — E119 Type 2 diabetes mellitus without complications: Secondary | ICD-10-CM | POA: Diagnosis present

## 2011-11-02 DIAGNOSIS — R739 Hyperglycemia, unspecified: Secondary | ICD-10-CM

## 2011-11-02 DIAGNOSIS — S98119A Complete traumatic amputation of unspecified great toe, initial encounter: Secondary | ICD-10-CM

## 2011-11-02 DIAGNOSIS — M7989 Other specified soft tissue disorders: Secondary | ICD-10-CM

## 2011-11-02 DIAGNOSIS — E1149 Type 2 diabetes mellitus with other diabetic neurological complication: Secondary | ICD-10-CM

## 2011-11-02 DIAGNOSIS — I2510 Atherosclerotic heart disease of native coronary artery without angina pectoris: Secondary | ICD-10-CM

## 2011-11-02 DIAGNOSIS — G4730 Sleep apnea, unspecified: Secondary | ICD-10-CM

## 2011-11-02 DIAGNOSIS — Z951 Presence of aortocoronary bypass graft: Secondary | ICD-10-CM

## 2011-11-02 DIAGNOSIS — F411 Generalized anxiety disorder: Secondary | ICD-10-CM

## 2011-11-02 DIAGNOSIS — Z794 Long term (current) use of insulin: Secondary | ICD-10-CM

## 2011-11-02 DIAGNOSIS — IMO0002 Reserved for concepts with insufficient information to code with codable children: Secondary | ICD-10-CM

## 2011-11-02 DIAGNOSIS — I10 Essential (primary) hypertension: Secondary | ICD-10-CM

## 2011-11-02 DIAGNOSIS — I509 Heart failure, unspecified: Secondary | ICD-10-CM | POA: Diagnosis present

## 2011-11-02 DIAGNOSIS — F329 Major depressive disorder, single episode, unspecified: Secondary | ICD-10-CM

## 2011-11-02 DIAGNOSIS — L03119 Cellulitis of unspecified part of limb: Secondary | ICD-10-CM

## 2011-11-02 DIAGNOSIS — E669 Obesity, unspecified: Secondary | ICD-10-CM | POA: Diagnosis present

## 2011-11-02 DIAGNOSIS — I872 Venous insufficiency (chronic) (peripheral): Secondary | ICD-10-CM

## 2011-11-02 DIAGNOSIS — Z8673 Personal history of transient ischemic attack (TIA), and cerebral infarction without residual deficits: Secondary | ICD-10-CM

## 2011-11-02 DIAGNOSIS — Z87891 Personal history of nicotine dependence: Secondary | ICD-10-CM

## 2011-11-02 DIAGNOSIS — I739 Peripheral vascular disease, unspecified: Secondary | ICD-10-CM

## 2011-11-02 DIAGNOSIS — L02419 Cutaneous abscess of limb, unspecified: Secondary | ICD-10-CM | POA: Diagnosis present

## 2011-11-02 DIAGNOSIS — Z7901 Long term (current) use of anticoagulants: Secondary | ICD-10-CM

## 2011-11-02 DIAGNOSIS — E7800 Pure hypercholesterolemia, unspecified: Secondary | ICD-10-CM

## 2011-11-02 DIAGNOSIS — E1165 Type 2 diabetes mellitus with hyperglycemia: Secondary | ICD-10-CM | POA: Diagnosis present

## 2011-11-02 DIAGNOSIS — R5383 Other fatigue: Secondary | ICD-10-CM

## 2011-11-02 DIAGNOSIS — J4489 Other specified chronic obstructive pulmonary disease: Secondary | ICD-10-CM | POA: Diagnosis present

## 2011-11-02 DIAGNOSIS — N189 Chronic kidney disease, unspecified: Secondary | ICD-10-CM | POA: Diagnosis present

## 2011-11-02 DIAGNOSIS — L03116 Cellulitis of left lower limb: Secondary | ICD-10-CM

## 2011-11-02 DIAGNOSIS — I252 Old myocardial infarction: Secondary | ICD-10-CM

## 2011-11-02 DIAGNOSIS — N400 Enlarged prostate without lower urinary tract symptoms: Secondary | ICD-10-CM

## 2011-11-02 DIAGNOSIS — Z7982 Long term (current) use of aspirin: Secondary | ICD-10-CM

## 2011-11-02 DIAGNOSIS — Z79899 Other long term (current) drug therapy: Secondary | ICD-10-CM

## 2011-11-02 DIAGNOSIS — Z8739 Personal history of other diseases of the musculoskeletal system and connective tissue: Secondary | ICD-10-CM

## 2011-11-02 DIAGNOSIS — I5022 Chronic systolic (congestive) heart failure: Secondary | ICD-10-CM

## 2011-11-02 DIAGNOSIS — L97909 Non-pressure chronic ulcer of unspecified part of unspecified lower leg with unspecified severity: Principal | ICD-10-CM | POA: Diagnosis present

## 2011-11-02 DIAGNOSIS — Z86711 Personal history of pulmonary embolism: Secondary | ICD-10-CM

## 2011-11-02 DIAGNOSIS — E1159 Type 2 diabetes mellitus with other circulatory complications: Secondary | ICD-10-CM

## 2011-11-02 DIAGNOSIS — M25562 Pain in left knee: Secondary | ICD-10-CM

## 2011-11-02 DIAGNOSIS — T368X5A Adverse effect of other systemic antibiotics, initial encounter: Secondary | ICD-10-CM

## 2011-11-02 DIAGNOSIS — R600 Localized edema: Secondary | ICD-10-CM

## 2011-11-02 DIAGNOSIS — K219 Gastro-esophageal reflux disease without esophagitis: Secondary | ICD-10-CM | POA: Diagnosis present

## 2011-11-02 DIAGNOSIS — I129 Hypertensive chronic kidney disease with stage 1 through stage 4 chronic kidney disease, or unspecified chronic kidney disease: Secondary | ICD-10-CM | POA: Diagnosis present

## 2011-11-02 DIAGNOSIS — Z6841 Body Mass Index (BMI) 40.0 and over, adult: Secondary | ICD-10-CM

## 2011-11-02 DIAGNOSIS — K2100 Gastro-esophageal reflux disease with esophagitis, without bleeding: Secondary | ICD-10-CM

## 2011-11-02 DIAGNOSIS — J449 Chronic obstructive pulmonary disease, unspecified: Secondary | ICD-10-CM

## 2011-11-02 LAB — CBC WITH DIFFERENTIAL/PLATELET
Basophils Relative: 0 % (ref 0–1)
Eosinophils Absolute: 0.3 10*3/uL (ref 0.0–0.7)
Eosinophils Relative: 2 % (ref 0–5)
Lymphs Abs: 1.4 10*3/uL (ref 0.7–4.0)
MCH: 25.4 pg — ABNORMAL LOW (ref 26.0–34.0)
MCHC: 32.7 g/dL (ref 30.0–36.0)
MCV: 77.7 fL — ABNORMAL LOW (ref 78.0–100.0)
Platelets: 179 10*3/uL (ref 150–400)
RBC: 5.2 MIL/uL (ref 4.22–5.81)
RDW: 18.3 % — ABNORMAL HIGH (ref 11.5–15.5)

## 2011-11-02 LAB — URINALYSIS, ROUTINE W REFLEX MICROSCOPIC
Glucose, UA: >1000 mg/dL — AB
Hgb urine dipstick: NEGATIVE
Ketones, ur: NEGATIVE mg/dL
Protein, ur: NEGATIVE mg/dL

## 2011-11-02 LAB — POCT I-STAT, CHEM 8
Calcium, Ion: 1.17 mmol/L (ref 1.13–1.30)
Glucose, Bld: 362 mg/dL — ABNORMAL HIGH (ref 70–99)
HCT: 43 % (ref 39.0–52.0)
Hemoglobin: 14.6 g/dL (ref 13.0–17.0)
Potassium: 4.8 mEq/L (ref 3.5–5.1)

## 2011-11-02 LAB — URINE MICROSCOPIC-ADD ON

## 2011-11-02 LAB — BASIC METABOLIC PANEL
Calcium: 9.4 mg/dL (ref 8.4–10.5)
GFR calc Af Amer: 79 mL/min — ABNORMAL LOW (ref >90)
GFR calc non Af Amer: 68 mL/min — ABNORMAL LOW (ref >90)
Glucose, Bld: 390 mg/dL — ABNORMAL HIGH (ref 70–99)
Sodium: 132 mEq/L — ABNORMAL LOW (ref 135–145)

## 2011-11-02 LAB — PROTIME-INR: Prothrombin Time: 18.8 seconds — ABNORMAL HIGH (ref 11.6–15.2)

## 2011-11-02 MED ORDER — SODIUM CHLORIDE 0.9 % IV BOLUS (SEPSIS)
500.0000 mL | Freq: Once | INTRAVENOUS | Status: AC
  Administered 2011-11-03: 500 mL via INTRAVENOUS

## 2011-11-02 MED ORDER — MORPHINE SULFATE 4 MG/ML IJ SOLN
4.0000 mg | Freq: Once | INTRAMUSCULAR | Status: AC
  Administered 2011-11-03: 4 mg via INTRAVENOUS
  Filled 2011-11-02: qty 1

## 2011-11-02 MED ORDER — CLINDAMYCIN PHOSPHATE 900 MG/50ML IV SOLN
900.0000 mg | Freq: Once | INTRAVENOUS | Status: AC
  Administered 2011-11-03: 900 mg via INTRAVENOUS
  Filled 2011-11-02: qty 50

## 2011-11-02 MED ORDER — ENOXAPARIN SODIUM 150 MG/ML ~~LOC~~ SOLN
130.0000 mg | Freq: Once | SUBCUTANEOUS | Status: AC
  Administered 2011-11-03: 130 mg via SUBCUTANEOUS
  Filled 2011-11-02 (×2): qty 1

## 2011-11-02 NOTE — ED Provider Notes (Signed)
History     CSN: [id number]  Arrival date & time 11/02/11  1931   First MD Initiated Contact with Patient 11/02/11 2235      Chief Complaint  Patient presents with  . Leg Pain    (Consider location/radiation/quality/duration/timing/severity/associated sxs/prior treatment) Patient is a 71 y.o. male presenting with leg pain. The history is provided by the patient and the spouse.  Leg Pain  The incident occurred more than 2 days ago. The incident occurred at home. There was no injury mechanism. The pain is present in the left leg and right leg. The pain is at a severity of 7/10. The pain is severe. The pain has been constant since onset. Pertinent negatives include no numbness, no loss of sensation and no tingling. He reports no foreign bodies present. The symptoms are aggravated by activity, bearing weight and palpation. He has tried nothing for the symptoms.    Past Medical History  Diagnosis Date  . Depression   . Diabetes mellitus   . COPD (chronic obstructive pulmonary disease)   . Adhesive capsulitis   . MRSA bacteremia     2011 - possible endocarditis, received 6 weeks IV treatment  . Hyperlipidemia   . Anxiety   . GERD (gastroesophageal reflux disease)   . CAD (coronary artery disease)   . 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 CVA (cerebral infarction) Questionable history  . Diverticulosis   . Osteomyelitis 2012    Sternoclavicular joint   . Chronic back pain   . Angina   . Myocardial infarction   . CHF (congestive heart failure)   . Shortness of breath   . Sleep apnea   . Chronic kidney disease     hx of BPH  . Neuromuscular disorder     HX of diabetic periferal neuropathy  . Hypertension   . Peripheral vascular disease     Past Surgical History  Procedure Date  . Septic arthritis     Removal of infected CABG wire by Dr Edwyna Shell - 2011  . Coronary artery bypass graft 1992  . Fracture surgery 1980s    Hip  . Spine surgery 2002      Cervical fusion vertebroplasty C3-4-5  . Coronary stent placement 1999, 2002  . Doppler echocardiography Sept 2011    EF 50-55% with some impaired diastolic relaxation  . Colonoscopy 2006     Multiple polyps removed, repeat in 5 years  . Right iliopopliteal bypass - 1983 1983  . Femoral-popliteal bypass graft 2008    Left  . Cardiac catheterization October 18, 2010    Known obstructive disease, no further blockages  . Cardiac catheterization 03/17/2005    EF 35-40%  . US echocardiography 03/04/2006    EF 50-55%  . 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 Heart disease Father     History  Substance Use Topics  . Smoking status: Former Smoker    Quit date: 06/19/2000  . Smokeless tobacco: Never Used  . Alcohol Use: No      Review of Systems  Constitutional: Negative for fever and chills.  HENT: Negative.   Respiratory: Positive for shortness of breath (exertional, at his baseline). Negative for cough.   Cardiovascular: Positive for leg swelling. Negative for chest pain.  Gastrointestinal: Negative for  nausea, vomiting, abdominal pain, constipation and blood in stool.  Genitourinary: Negative for dysuria and decreased urine volume.  Skin: Positive for color change and wound.  Neurological: Negative for tingling and numbness.  Hematological: Bruises/bleeds easily (is on coumadin).  Psychiatric/Behavioral: Negative.   All other systems reviewed and are negative.    Allergies  Diazepam  Home Medications   Current Outpatient Rx  Name Route Sig Dispense Refill  . ACETAMINOPHEN-CODEINE #3 300-30 MG PO TABS Oral Take 1 tablet by mouth every 6 (six) hours as needed. For pain.    . ALBUTEROL SULFATE HFA 108 (90 BASE) MCG/ACT IN AERS Inhalation Inhale 2 puffs into the lungs every 4 (four) hours as needed.  For shortness of breath    . ALBUTEROL SULFATE (2.5 MG/3ML) 0.083% IN NEBU Nebulization Take 2.5 mg by nebulization every 6 (six) hours as needed. For shortness of breath    . ASPIRIN 81 MG PO CHEW Oral Chew 81 mg by mouth daily.     Marland Kitchen BISOPROLOL FUMARATE 5 MG PO TABS Oral Take 5 mg by mouth 2 (two) times daily.    Marland Kitchen FLUTICASONE-SALMETEROL 100-50 MCG/DOSE IN AEPB Inhalation Inhale 1 puff into the lungs 2 (two) times daily.    . INSULIN ISOPHANE HUMAN 100 UNIT/ML [place] SUSP Subcutaneous Inject 70 Units into the skin 2 (two) times daily.    Marland Kitchen LISINOPRIL 10 MG PO TABS Oral Take 1 tablet (10 mg total) by mouth daily. 90 tablet 2  . NITROGLYCERIN 0.4 MG SL SUBL Sublingual Place 0.4 mg under the tongue as needed. For chest pains    . SIMVASTATIN 80 MG PO TABS Oral Take 80 mg by mouth at bedtime.    . TAMSULOSIN HCL 0.4 MG PO CAPS Oral Take 0.4 mg by mouth daily after breakfast.    . TORSEMIDE 20 MG PO TABS Oral Take 40 mg by mouth daily.    . TRAMADOL HCL 50 MG PO TABS Oral Take 100 mg by mouth every 8 (eight) hours as needed. For pain    . VENLAFAXINE HCL ER 37.5 MG PO CP24 Oral Take 37.5 mg by mouth daily.    . WARFARIN SODIUM 5 MG PO TABS Oral Take 5-7.5 mg by mouth daily. Take 5mg  every day, except take 7.5mg  on Monday and Friday      BP 115/73  Pulse 103  Temp 97.4 F (36.3 C) (Oral)  Resp 16  SpO2 96%  Physical Exam  Nursing note and vitals reviewed. Constitutional: He is oriented to person, place, and time. He appears well-developed and well-nourished.  HENT:  Head: Normocephalic and atraumatic.  Right Ear: External ear normal.  Left Ear: External ear normal.  Nose: Nose normal.  Cardiovascular: Normal rate, regular rhythm, normal heart sounds and intact distal pulses.   Pulmonary/Chest: Effort normal and breath sounds normal. No accessory muscle usage. Not tachypneic. No respiratory distress.  Abdominal: Soft. He exhibits no distension and no mass. There is no tenderness. There is no  rebound and no guarding.  Musculoskeletal:       Legs: Neurological: He is alert and oriented to person, place, and time.  Skin: Skin is warm and dry.    ED Course  Procedures (including critical care time)  Labs Reviewed  CBC WITH DIFFERENTIAL - Abnormal; Notable for the following:    WBC 11.2 (*)     MCV 77.7 (*)     MCH 25.4 (*)     RDW 18.3 (*)     Neutro Abs  8.4 (*)     All other components within normal limits  BASIC METABOLIC PANEL - Abnormal; Notable for the following:    Sodium 132 (*)     Chloride 94 (*)     Glucose, Bld 390 (*)     GFR calc non Af Amer 68 (*)     GFR calc Af Amer 79 (*)     All other components within normal limits  POCT I-STAT, CHEM 8 - Abnormal; Notable for the following:    Glucose, Bld 362 (*)     All other components within normal limits  PROTIME-INR   No results found.   1. Lower extremity cellulitis   2. Hyperglycemia       MDM  71 yo male with bilateral lower extremity erythema, swelling and weeping over past 3 days. Afebrile, no vomiting. Hx of mechanical fall 2 weeks ago with abrasions, likely source of his cellulitis. 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 [place]ver INR 1.54 and has been borderline recently, will give one dose of lovenox and in AM can get dopplers. Given clindamycin, will admit due to white count, bilateral symptoms and multiple comorbidities.        Pricilla Loveless, MD 11/02/11 [phone]

## 2011-11-02 NOTE — ED Notes (Signed)
Iv team called back will come

## 2011-11-02 NOTE — ED Notes (Signed)
Patient brought back out to waiting room from triage; currently resting quietly in wheelchair. No respiratory or acute distress.  Patient calm and cooperative.  Updated patient on wait time; apologized about delay.  Patient verbalizes understanding; will continue to monitor.

## 2011-11-02 NOTE — ED Notes (Signed)
Pt states that his legs having been hurting him for the past three days. Pt states they started blistering and increased in redness throughout the three days.pt states painful to walk. Pt legs warm to touch, swollen, red, and open weeping blisters. states.

## 2011-11-02 NOTE — ED Notes (Signed)
The pt has had bi-lateral leg swelling and redness for 3 days which is a chronic problem.   The pts wife has gone home to bring back some clean underwear.  Pt watching tv.  Blanket given the pt has been stuck multiple times for  Labs.  Iv team called to start his iv

## 2011-11-02 NOTE — ED Notes (Signed)
The pt is crying now.  Wife at the  Bedside.[name]  He says he voids frequently.  Urinal given

## 2011-11-03 ENCOUNTER — Encounter (HOSPITAL_COMMUNITY): Payer: Self-pay | Admitting: Orthopedic Surgery

## 2011-11-03 DIAGNOSIS — L03119 Cellulitis of unspecified part of limb: Secondary | ICD-10-CM

## 2011-11-03 DIAGNOSIS — L97929 Non-pressure chronic ulcer of unspecified part of left lower leg with unspecified severity: Secondary | ICD-10-CM

## 2011-11-03 DIAGNOSIS — E119 Type 2 diabetes mellitus without complications: Secondary | ICD-10-CM

## 2011-11-03 DIAGNOSIS — I83229 Varicose veins of left lower extremity with both ulcer of unspecified site and inflammation: Secondary | ICD-10-CM

## 2011-11-03 DIAGNOSIS — L02419 Cutaneous abscess of limb, unspecified: Secondary | ICD-10-CM

## 2011-11-03 DIAGNOSIS — I83219 Varicose veins of right lower extremity with both ulcer of unspecified site and inflammation: Secondary | ICD-10-CM

## 2011-11-03 DIAGNOSIS — L97919 Non-pressure chronic ulcer of unspecified part of right lower leg with unspecified severity: Secondary | ICD-10-CM

## 2011-11-03 DIAGNOSIS — R609 Edema, unspecified: Secondary | ICD-10-CM

## 2011-11-03 LAB — BASIC METABOLIC PANEL
Calcium: 8.9 mg/dL (ref 8.4–10.5)
Chloride: 98 mEq/L (ref 96–112)
Creatinine, Ser: 0.95 mg/dL (ref 0.50–1.35)
GFR calc Af Amer: >90 mL/min (ref >90)
Sodium: 132 mEq/L — ABNORMAL LOW (ref 135–145)

## 2011-11-03 LAB — SURGICAL PCR SCREEN
MRSA, PCR: POSITIVE — AB
Staphylococcus aureus: POSITIVE — AB

## 2011-11-03 LAB — GLUCOSE, CAPILLARY

## 2011-11-03 LAB — CBC
Platelets: 167 10*3/uL (ref 150–400)
RBC: 4.92 MIL/uL (ref 4.22–5.81)
RDW: 18.1 % — ABNORMAL HIGH (ref 11.5–15.5)
WBC: 10 10*3/uL (ref 4.0–10.5)

## 2011-11-03 LAB — HEMOGLOBIN A1C: Mean Plasma Glucose: 235 mg/dL — ABNORMAL HIGH (ref <117)

## 2011-11-03 MED ORDER — ACETAMINOPHEN-CODEINE #3 300-30 MG PO TABS: 1.0000 | ORAL_TABLET | Freq: Four times a day (QID) | ORAL | Status: DC | PRN

## 2011-11-03 MED ORDER — SODIUM CHLORIDE 0.9 % IV SOLN: 250.0000 mL | INTRAVENOUS | Status: DC | PRN

## 2011-11-03 MED ORDER — TRAMADOL HCL 50 MG PO TABS
100.0000 mg | ORAL_TABLET | Freq: Three times a day (TID) | ORAL | Status: DC | PRN
  Administered 2011-11-03 – 2011-11-04 (×3): 100 mg via ORAL
  Filled 2011-11-03 (×3): qty 2

## 2011-11-03 MED ORDER — WARFARIN SODIUM 7.5 MG PO TABS
7.5000 mg | ORAL_TABLET | Freq: Once | ORAL | Status: AC
  Administered 2011-11-03: 7.5 mg via ORAL
  Filled 2011-11-03: qty 1

## 2011-11-03 MED ORDER — WARFARIN - PHARMACIST DOSING INPATIENT: Freq: Every day | Status: DC

## 2011-11-03 MED ORDER — CLINDAMYCIN HCL 300 MG PO CAPS
600.0000 mg | ORAL_CAPSULE | Freq: Three times a day (TID) | ORAL | Status: DC
  Administered 2011-11-03 – 2011-11-04 (×2): 600 mg via ORAL
  Filled 2011-11-03 (×5): qty 2

## 2011-11-03 MED ORDER — FLUTICASONE-SALMETEROL 100-50 MCG/DOSE IN AEPB
1.0000 | INHALATION_SPRAY | Freq: Two times a day (BID) | RESPIRATORY_TRACT | Status: DC
  Administered 2011-11-03 – 2011-11-04 (×3): 1 via RESPIRATORY_TRACT
  Filled 2011-11-03: qty 14

## 2011-11-03 MED ORDER — SODIUM CHLORIDE 0.9 % IJ SOLN
3.0000 mL | Freq: Two times a day (BID) | INTRAMUSCULAR | Status: DC
  Administered 2011-11-03: 3 mL via INTRAVENOUS

## 2011-11-03 MED ORDER — ASPIRIN 81 MG PO CHEW
81.0000 mg | CHEWABLE_TABLET | Freq: Every day | ORAL | Status: DC
  Administered 2011-11-03 – 2011-11-04 (×2): 81 mg via ORAL
  Filled 2011-11-03 (×2): qty 1

## 2011-11-03 MED ORDER — MUPIROCIN 2 % EX OINT
1.0000 "application " | TOPICAL_OINTMENT | Freq: Two times a day (BID) | CUTANEOUS | Status: DC
  Administered 2011-11-03 – 2011-11-04 (×3): 1 via NASAL
  Filled 2011-11-03: qty 22

## 2011-11-03 MED ORDER — LISINOPRIL 10 MG PO TABS
10.0000 mg | ORAL_TABLET | Freq: Every day | ORAL | Status: DC
  Administered 2011-11-03 – 2011-11-04 (×2): 10 mg via ORAL
  Filled 2011-11-03 (×2): qty 1

## 2011-11-03 MED ORDER — INSULIN ASPART 100 UNIT/ML ~~LOC~~ SOLN
0.0000 [IU] | Freq: Every day | SUBCUTANEOUS | Status: DC
  Administered 2011-11-03: 4 [IU] via SUBCUTANEOUS

## 2011-11-03 MED ORDER — MUPIROCIN 2 % EX OINT: 1.0000 "application " | TOPICAL_OINTMENT | Freq: Two times a day (BID) | CUTANEOUS | Status: DC

## 2011-11-03 MED ORDER — BISOPROLOL FUMARATE 5 MG PO TABS
5.0000 mg | ORAL_TABLET | Freq: Two times a day (BID) | ORAL | Status: DC
Start: 2011-11-03 — End: 2011-11-04
  Administered 2011-11-03 – 2011-11-04 (×3): 5 mg via ORAL
  Filled 2011-11-03 (×5): qty 1

## 2011-11-03 MED ORDER — INSULIN ASPART 100 UNIT/ML ~~LOC~~ SOLN
0.0000 [IU] | Freq: Three times a day (TID) | SUBCUTANEOUS | Status: DC
  Administered 2011-11-03: 5 [IU] via SUBCUTANEOUS
  Administered 2011-11-03 (×2): 8 [IU] via SUBCUTANEOUS
  Administered 2011-11-04: 3 [IU] via SUBCUTANEOUS
  Administered 2011-11-04: 8 [IU] via SUBCUTANEOUS
  Administered 2011-11-04: 3 [IU] via SUBCUTANEOUS

## 2011-11-03 MED ORDER — CHLORHEXIDINE GLUCONATE CLOTH 2 % EX PADS: 6.0000 | MEDICATED_PAD | Freq: Every day | CUTANEOUS | Status: DC

## 2011-11-03 MED ORDER — CHLORHEXIDINE GLUCONATE CLOTH 2 % EX PADS
6.0000 | MEDICATED_PAD | Freq: Every day | CUTANEOUS | Status: DC
  Administered 2011-11-03: 6 via TOPICAL

## 2011-11-03 MED ORDER — ATORVASTATIN CALCIUM 40 MG PO TABS
40.0000 mg | ORAL_TABLET | Freq: Every day | ORAL | Status: DC
  Administered 2011-11-03 – 2011-11-04 (×2): 40 mg via ORAL
  Filled 2011-11-03 (×2): qty 1

## 2011-11-03 MED ORDER — MORPHINE SULFATE 2 MG/ML IJ SOLN: 2.0000 mg | INTRAMUSCULAR | Status: DC | PRN

## 2011-11-03 MED ORDER — ENOXAPARIN SODIUM 150 MG/ML ~~LOC~~ SOLN
135.0000 mg | Freq: Two times a day (BID) | SUBCUTANEOUS | Status: DC
  Filled 2011-11-03 (×2): qty 1

## 2011-11-03 MED ORDER — SODIUM CHLORIDE 0.9 % IJ SOLN: 3.0000 mL | INTRAMUSCULAR | Status: DC | PRN

## 2011-11-03 MED ORDER — CLINDAMYCIN PHOSPHATE 600 MG/50ML IV SOLN
600.0000 mg | Freq: Three times a day (TID) | INTRAVENOUS | Status: DC
  Administered 2011-11-03 (×2): 600 mg via INTRAVENOUS
  Filled 2011-11-03 (×4): qty 50

## 2011-11-03 MED ORDER — INSULIN GLARGINE 100 UNIT/ML ~~LOC~~ SOLN
10.0000 [IU] | Freq: Every day | SUBCUTANEOUS | Status: DC
  Administered 2011-11-03: 23:00:00 via SUBCUTANEOUS

## 2011-11-03 MED ORDER — POLYVINYL ALCOHOL 1.4 % OP SOLN
2.0000 [drp] | OPHTHALMIC | Status: DC | PRN
  Administered 2011-11-03: 2 [drp] via OPHTHALMIC
  Filled 2011-11-03: qty 15

## 2011-11-03 MED ORDER — TORSEMIDE 20 MG PO TABS
40.0000 mg | ORAL_TABLET | Freq: Every day | ORAL | Status: DC
  Administered 2011-11-03 – 2011-11-04 (×2): 40 mg via ORAL
  Filled 2011-11-03 (×2): qty 2

## 2011-11-03 MED ORDER — TAMSULOSIN HCL 0.4 MG PO CAPS
0.4000 mg | ORAL_CAPSULE | Freq: Every day | ORAL | Status: DC
Start: 2011-11-03 — End: 2011-11-04
  Administered 2011-11-03 – 2011-11-04 (×2): 0.4 mg via ORAL
  Filled 2011-11-03 (×3): qty 1

## 2011-11-03 MED ORDER — ALBUTEROL SULFATE HFA 108 (90 BASE) MCG/ACT IN AERS: 2.0000 | INHALATION_SPRAY | RESPIRATORY_TRACT | Status: DC | PRN

## 2011-11-03 NOTE — Progress Notes (Signed)
Inpatient Diabetes Program Recommendations  AACE/ADA: New Consensus Statement on Inpatient Glycemic Control (2013)  Target Ranges:  Prepandial:   less than 140 mg/dL      Peak postprandial:   less than 180 mg/dL (1-2 hours)      Critically ill patients:  140 - 180 mg/dL   Reason for Visit: Hyperglycemia  Inpatient Diabetes Program Recommendations Insulin - Basal: No basal insulin ordered at present.  Patient takes NPH 70 units BID at home. HgbA1C: Ordered.  Note:  Results for DENNY, MCCREE (MRN [id number]) as of 11/03/2011 10:15  Ref. Range 11/03/2011 02:39 11/03/2011 07:27  Glucose-Capillary Latest Range: 70-99 mg/dL 409 (H) 811 (H)   CBG's sub-optimal at present.  May benefit from addition of basal insulin.  If patient is to continue NPH at home after discharge, could start full home dose of NPH, or a percentage of home dose.  If plan is for patient to change type of basal insulin, may want to consider large-dose Lantus or Levemir either once daily or in divided doses.  Thank you. Sinaya Minogue S. Elsie Lincoln, RN, CNS, CDE  [phone])

## 2011-11-03 NOTE — H&P (Signed)
[name] is an 71 y.o. male.   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 Patient had a fall from standing a few weeks ago and had scratches on his knees. At that time, he was evaluated at [facility] but there were no signs of infection. Patient has an extensive PMH of venous insufficency and is followed by both [facility] and [facility]. He has not required unna boot in >1 month. The day following his fall, he states he began having increased swelling of lower extremities. The redness started 3 days ago and has been getting progressively worse. Patient reports this current episode looks different than his legs have looked in the past. He has used gauze with tape at home on ulcers, which seem to irritate his skin more. He has also been using ACE wraps occasionally which the last wrap being removed yesterday. His biggest concern is the pain. He has no focal point tenderness, but states both legs are extremely painful. Patient also reports that his right foot feels "heavier" to him. He is able to ambulate some. He uses a cane or walker at home, but the pain is currently preventing him from getting around. On ROS, patient denies HA, congestion, pain in chest, SOB, coughing up blood, abd pain. He does state he has had increased urine output lately. He states he has been talking all medications as prescribed.  ED Course: Patient had labs which showed a subtherapeutic INR of 1.54. Vital signs stable with mild tachycardia recorded at 103. Given his history of previous DVT now with low INR, ED provider was concerned about possible DVT. Unable to get venous duplex overnight. Also started on Clinda for concern for infectious process. FMTS called for admission.  Past  Medical History  Diagnosis Date  . Depression   . Diabetes mellitus   . COPD (chronic obstructive pulmonary disease)   . Adhesive capsulitis   . MRSA bacteremia     2011 - possible endocarditis, received 6 weeks IV treatment  . Hyperlipidemia   . Anxiety   . GERD (gastroesophageal reflux disease)   . CAD (coronary artery disease)   . 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 CVA (cerebral infarction) Questionable history  . Diverticulosis   . Osteomyelitis 2012    Sternoclavicular joint   . Chronic back pain   . Angina   . Myocardial infarction   . CHF (congestive heart failure)   . Shortness of breath   . Sleep apnea   . Chronic kidney disease     hx of BPH  . Neuromuscular disorder     HX of diabetic periferal neuropathy  . Hypertension   . Peripheral vascular disease     Past Surgical History  Procedure Date  . Septic arthritis     Removal of infected CABG wire by Dr Edwyna Shell - 2011  . Coronary artery bypass graft 1992  . Fracture surgery 1980s    Hip  . Spine surgery 2002    Cervical fusion vertebroplasty C3-4-5  . Coronary stent placement 1999, 2002  . Doppler echocardiography Sept 2011    EF 50-55% with some impaired diastolic relaxation  . Colonoscopy 2006     Multiple polyps removed, repeat in  5 years  . Right iliopopliteal bypass - 1983 1983  . Femoral-popliteal bypass graft 2008    Left  . Cardiac catheterization October 18, 2010    Known obstructive disease, no further blockages  . Cardiac catheterization 03/17/2005    EF 35-40%  . US echocardiography 03/04/2006    EF 50-55%  . Amputation 03/16/2011    Procedure: AMPUTATION DIGIT;  Surgeon: Chuck Hint, MD;  Location: [facility] OR;  Service: Vascular;  Laterality: Left;  Great toe    Family History  Problem Relation Age of Onset  . Heart disease Father    Social History:  reports that he quit smoking about 11 years ago. He has never used smokeless tobacco. He reports that  he does not drink alcohol or use illicit drugs.  Allergies:  Allergies  Allergen Reactions  . Diazepam Anxiety    REACTION: makes patient cry   Prior to Admission medications   Medication Sig Start Date End Date Taking? Authorizing Provider  acetaminophen-codeine (TYLENOL #3) 300-30 MG per tablet Take 1 tablet by mouth every 6 (six) hours as needed. For pain. 09/10/11  Yes Tobey Grim, MD  albuterol (PROVENTIL HFA;VENTOLIN HFA) 108 (90 BASE) MCG/ACT inhaler Inhale 2 puffs into the lungs every 4 (four) hours as needed. For shortness of breath 04/01/11 03/31/12 Yes Tobey Grim, MD  albuterol (PROVENTIL) (2.5 MG/3ML) 0.083% nebulizer solution Take 2.5 mg by nebulization every 6 (six) hours as needed. For shortness of breath 05/27/11 05/26/12 Yes Tobey Grim, MD  aspirin (BABY ASPIRIN) 81 MG chewable tablet Chew 81 mg by mouth daily.    Yes Historical Provider, MD  bisoprolol (ZEBETA) 5 MG tablet Take 5 mg by mouth 2 (two) times daily. 04/01/11  Yes Tobey Grim, MD  Fluticasone-Salmeterol (ADVAIR) 100-50 MCG/DOSE AEPB Inhale 1 puff into the lungs 2 (two) times daily. 05/27/11  Yes Tobey Grim, MD  insulin NPH (HUMULIN N,NOVOLIN N) 100 UNIT/ML injection Inject 70 Units into the skin 2 (two) times daily. 04/01/11  Yes Tobey Grim, MD  lisinopril (PRINIVIL,ZESTRIL) 10 MG tablet Take 1 tablet (10 mg total) by mouth daily. 09/19/11  Yes Tobey Grim, MD  nitroGLYCERIN (NITROSTAT) 0.4 MG SL tablet Place 0.4 mg under the tongue as needed. For chest pains 04/01/11  Yes Tobey Grim, MD  simvastatin (ZOCOR) 80 MG tablet Take 80 mg by mouth at bedtime. 04/01/11  Yes Tobey Grim, MD  Tamsulosin HCl (FLOMAX) 0.4 MG CAPS Take 0.4 mg by mouth daily after breakfast. 04/01/11  Yes Tobey Grim, MD  torsemide (DEMADEX) 20 MG tablet Take 40 mg by mouth daily. 09/23/11 09/22/12 Yes Macy Mis, MD  traMADol (ULTRAM) 50 MG tablet Take 100 mg by mouth every 8 (eight) hours as needed. For pain    Yes Historical Provider, MD  venlafaxine (EFFEXOR-XR) 37.5 MG 24 hr capsule Take 37.5 mg by mouth daily.   Yes Tobey Grim, MD  warfarin (COUMADIN) 5 MG tablet Take 5-7.5 mg by mouth daily. Take 5mg  every day, except take 7.5mg  on Monday and Friday 09/19/11  Yes Tobey Grim, MD   Results for orders placed during the hospital encounter of 11/02/11 (from the past 48 hour(s))  CBC WITH DIFFERENTIAL     Status: Abnormal   Collection Time   11/02/11  8:40 PM      Component Value Range Comment   WBC 11.2 (*) 4.0 - 10.5 K/uL    RBC 5.20  4.22 -  5.81 MIL/uL    Hemoglobin 13.2  13.0 - 17.0 g/dL    HCT 86.5  78.4 - 69.6 %    MCV 77.7 (*) 78.0 - 100.0 fL    MCH 25.4 (*) 26.0 - 34.0 pg    MCHC 32.7  30.0 - 36.0 g/dL    RDW 29.5 (*) 28.4 - 15.5 %    Platelets 179  150 - 400 K/uL    Neutrophils Relative 75  43 - 77 %    Neutro Abs 8.4 (*) 1.7 - 7.7 K/uL    Lymphocytes Relative 13  12 - 46 %    Lymphs Abs 1.4  0.7 - 4.0 K/uL    Monocytes Relative 9  3 - 12 %    Monocytes Absolute 1.0  0.1 - 1.0 K/uL    Eosinophils Relative 2  0 - 5 %    Eosinophils Absolute 0.3  0.0 - 0.7 K/uL    Basophils Relative 0  0 - 1 %    Basophils Absolute 0.0  0.0 - 0.1 K/uL   BASIC METABOLIC PANEL     Status: Abnormal   Collection Time   11/02/11  8:40 PM      Component Value Range Comment   Sodium 132 (*) 135 - 145 mEq/L    Potassium 4.8  3.5 - 5.1 mEq/L    Chloride 94 (*) 96 - 112 mEq/L    CO2 28  19 - 32 mEq/L    Glucose, Bld 390 (*) 70 - 99 mg/dL    BUN 22  6 - 23 mg/dL    Creatinine, Ser 1.32  0.50 - 1.35 mg/dL    Calcium 9.4  8.4 - 44.0 mg/dL    GFR calc non Af Amer 68 (*) >90 mL/min    GFR calc Af Amer 79 (*) >90 mL/min   POCT I-STAT, CHEM 8     Status: Abnormal   Collection Time   11/02/11  8:41 PM      Component Value Range Comment   Sodium 135  135 - 145 mEq/L    Potassium 4.8  3.5 - 5.1 mEq/L    Chloride 99  96 - 112 mEq/L    BUN 23  6 - 23 mg/dL    Creatinine, Ser 1.02  0.50 - 1.35 mg/dL     Glucose, Bld 725 (*) 70 - 99 mg/dL    Calcium, Ion 3.66  4.40 - 1.30 mmol/L    TCO2 26  0 - 100 mmol/L    Hemoglobin 14.6  13.0 - 17.0 g/dL    HCT 34.7  42.5 - 95.6 %   PROTIME-INR     Status: Abnormal   Collection Time   11/02/11 10:41 PM      Component Value Range Comment   Prothrombin Time 18.8 (*) 11.6 - 15.2 seconds    INR 1.54 (*) 0.00 - 1.49   URINALYSIS, ROUTINE W REFLEX MICROSCOPIC     Status: Abnormal   Collection Time   11/02/11 11:13 PM      Component Value Range Comment   Color, Urine YELLOW  YELLOW    APPearance CLEAR  CLEAR    Specific Gravity, Urine 1.025  1.005 - 1.030    pH 7.5  5.0 - 8.0    Glucose, UA >1000 (*) NEGATIVE mg/dL    Hgb urine dipstick NEGATIVE  NEGATIVE    Bilirubin Urine NEGATIVE  NEGATIVE    Ketones, ur NEGATIVE  NEGATIVE mg/dL    Protein,  ur NEGATIVE  NEGATIVE mg/dL    Urobilinogen, UA 0.2  0.0 - 1.0 mg/dL    Nitrite NEGATIVE  NEGATIVE    Leukocytes, UA NEGATIVE  NEGATIVE   URINE MICROSCOPIC-ADD ON     Status: Normal   Collection Time   11/02/11 11:13 PM      Component Value Range Comment   Squamous Epithelial / LPF RARE  RARE    WBC, UA 0-2  <3 WBC/hpf    RBC / HPF 0-2  <3 RBC/hpf    Bacteria, UA RARE  RARE    ROS Please see HPI above, otherwise ROS negative  Blood pressure 115/73, pulse 103, temperature 97.4 F (36.3 C), temperature source Oral, resp. rate 16, SpO2 96.00%. Physical Exam  Constitutional: He appears well-developed and well-nourished. No distress.       Obese male lying on stretcher  HENT:  Head: Normocephalic and atraumatic.       Wears dentures  Eyes:       Pupils small, unable to detect much reaction to light but room was well lit. Cataracts bilaterally  Neck: Normal range of motion.       Tenderness to right neck secondary to prior procedure  Cardiovascular: Normal rate, regular rhythm and normal heart sounds.   No murmur heard.      Unable to palpate pulses of lower extremities  Respiratory: Effort normal  and breath sounds normal. No respiratory distress. He has no wheezes.  GI: Soft. There is no tenderness.       Obese  Musculoskeletal:       Edema and exquisite tenderness of lower extremities to the knee. Diffuse erythema bilaterally from ankles to knees. Healed surgical scars extend down medial calves bilaterally, as well as linear surgical scar on left upper thigh. Oozing ulcers noted circumferentially around calves bilaterally. Yellow in appearance, all superficial as far as I can tell. No pain in popliteal area.  Right foot: One erythematous shallow ulcer on top of right foot. Sensation intact. Movement intact. Pulses not palpable. Toenails are not trimmed but do not appear ingrown. Left foot: 3-4 erythematous shallow ulcers on top of left foot. Left great toe surgically absent. Sensation intact but less compared to right foot. Pulses not palpable.   Lymphadenopathy:    He has no cervical adenopathy.  Neurological: He is alert. No cranial nerve deficit.       Unable to ambulate patient at admission secondary to pain    Assessment/Plan 70 yo M with extensive PMH noted above presenting for 3 days of worsening bilateral lower extremity edema, erythema and pain.  # Lower extremity edema- DDx includes bilateral DVT in setting of subtherapeutic INR, venous stasis ulcers, and cellulitis. No signs of osteomyelitis or deep wounds at this time. - Admit to inpatient, attending Dr. Sheffield Slider - For r/o DVT, will order bilateral venous duplex. Patient received Lovenox in ED for bridge until INR therapeutic. Will continue Lovenox bridge, and consult pharmacy for Coumadin management. - For possible venous stasis ulcers, will consult wound care. Patient has required extensive unna boot and ulcer treatment in the past. Will ask for wound care to not place unna boot at this time since venous duplex will need to be performed. More than likely patient will need long-term compression therapy for legs. For now, keep legs  elevated while in bed. - For possible cellulitis, patient was started on Clinda in the ED. He had red man in the past with [facility]anc. Patient did have a fall which  abrasions to the knees which could be a source of infection, but the scabs from the fall are healing well without surrounding erythema. Will continue Clinda for now, especially in setting of elevated WBC. - Control pain with Tylenol #3 (home medication) and Morphine as needed for break through pain - Patient is followed by vascular surgery for chronic PVD with recent ABI. No need to consult at this time  # HTN/Chronic CHF- BP stable at admission. No signs of fluid overload - Continue home medications - Given small 500cc bolus in the ED. IVF now saline locked - Can consider giving extra diuretic, if needed - Continue to monitor BP   # DM- Glucose in the 300's in the ED - Start SSI qac/hs. Will likely need more coverage and the addition of Lantus.  - Continue to monitor CBG  # COPD- Patient is not a smoker, but is exposed to smoke by wife's children. Currently stable with no reported symptoms and lungs CTA on physical exam - Continue home inhalers - Supplemental O2 as needed for symptomatic relief or desats  # FEN/GI- Carb modified diet # PPx- Lovenox bridge, coumadin per pharmacy # Dispo- Pending clinical improvement and further work up  [employer], Maizee Reinhold 11/03/2011, 12:04 AM

## 2011-11-03 NOTE — Progress Notes (Signed)
O2 sats at 92% on rm air when lying in bed. After ambulating to BR pt became sob and sats decreased to 82%. 2L O2 started by nasal cannula. Sat increased to 93%. Incentive spirometer started.

## 2011-11-03 NOTE — Progress Notes (Signed)
Bilateral:  No evidence of DVT, superficial thrombosis, or Baker's Cyst.  Technically difficult study due to the patient's body habitus.   

## 2011-11-03 NOTE — ED Notes (Signed)
Clindamycin has infused morphine given for pain.  His wife has returned.  Room assigned

## 2011-11-03 NOTE — Progress Notes (Signed)
Utilization review completed. Annella Prowell, RN, BSN. 

## 2011-11-03 NOTE — Progress Notes (Signed)
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 Diazepam Anxiety    REACTION: makes patient cry    Patient Measurements: Height: 5\' 8"  (172.7 cm) Weight: 306 lb 9.6 oz (139.073 kg) IBW/kg (Calculated) : 68.4    Vital Signs: Temp: 97.4 F (36.3 C) (08/11 2036) Temp src: Oral (08/11 2036) BP: 115/73 mmHg (08/11 2036) Pulse Rate: 103  (08/11 2036)  Labs:  Basename 11/02/11 2241 11/02/11 2041 11/02/11 2040  HGB -- 14.6 13.2  HCT -- 43.0 40.4  PLT -- -- 179  APTT -- -- --  LABPROT 18.8* -- --  INR 1.54* -- --  HEPARINUNFRC -- -- --  CREATININE -- 1.10 1.06  CKTOTAL -- -- --  CKMB -- -- --  TROPONINI -- -- --    Estimated Creatinine Clearance: 83 ml/min (by C-G formula based on Cr of 1.1).   Medical History: Past Medical History  Diagnosis Date  . Depression   . Diabetes mellitus   . COPD (chronic obstructive pulmonary disease)   . Adhesive capsulitis   . MRSA bacteremia     2011 - possible endocarditis, received 6 weeks IV treatment  . Hyperlipidemia   . Anxiety   . GERD (gastroesophageal reflux disease)   . CAD (coronary artery disease)   . 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 CVA (cerebral infarction) Questionable history  . Diverticulosis   . Osteomyelitis 2012    Sternoclavicular joint   . Chronic back pain   . Angina   . Myocardial infarction   . CHF (congestive heart failure)   . Shortness of breath   . Sleep apnea   . Chronic kidney disease     hx of BPH  . Neuromuscular disorder     HX of diabetic periferal neuropathy  . Hypertension   . Peripheral vascular disease     Medications:  Scheduled:    . aspirin  81 mg Oral Daily  . atorvastatin  40 mg Oral q1800  . bisoprolol  5 mg Oral BID  . clindamycin (CLEOCIN) IV  600 mg Intravenous Q8H  . clindamycin (CLEOCIN) IV  900 mg Intravenous Once  . enoxaparin  (LOVENOX) injection  1 mg/kg Subcutaneous Q12H  . enoxaparin (LOVENOX) injection  130 mg Subcutaneous Once  . Fluticasone-Salmeterol  1 puff Inhalation BID  . insulin aspart  0-15 Units Subcutaneous TID WC  . insulin aspart  0-5 Units Subcutaneous QHS  . lisinopril  10 mg Oral Daily  .  morphine injection  4 mg Intravenous Once  . sodium chloride  500 mL Intravenous Once  . sodium chloride  3 mL Intravenous Q12H  . Tamsulosin HCl  0.4 mg Oral QPC breakfast  . torsemide  40 mg Oral Daily    Assessment: 71 yo male with h/o DVT admitted with possible new DVT.  Patient is on Coumadin 5mg  daily, except 7.5mg  on Mondays.  INR (1.54) is below-goal. Patient with physician orders for Lovenox 1mg /kg Q12H.   Goal of Therapy:  INR 2-3 Monitor platelets by anticoagulation protocol: Yes   Plan:  1. Coumadin 7.5mg  po x 1 now.  2. Daily PT / INR  Emeline Gins 11/03/2011,1:48 AM

## 2011-11-03 NOTE — H&P (Signed)
I interviewed and examined this patient and discussed the care plan with Dr. Mikel Cella and the [facility] team and agree with assessment and plan as documented in the admission note. I would culture the pustule on his left calf, although he is already on Clindamycin. I discussed his stasis dermatitis with Renold Don, PCP, who has had difficulty getting him to wear support stockings when the leg ulcers are healed.     Fransisco Messmer A. Sheffield Slider, MD Family Medicine Teaching Service Attending  11/03/2011 9:19 AM

## 2011-11-03 NOTE — Progress Notes (Signed)
ANTICOAGULATION CONSULT NOTE - Follow Up Consult  Pharmacy Consult for Coumadin Indication: R/O new DVT.  (+)hx DVT/PE  Allergies  Allergen Reactions  . Diazepam Anxiety    REACTION: makes patient cry    Patient Measurements: Height: 5\' 8"  (172.7 cm) Weight: 306 lb 9.6 oz (139.073 kg) IBW/kg (Calculated) : 68.4  Heparin Dosing Weight:   Vital Signs: Temp: 98.2 F (36.8 C) (08/12 0557) Temp src: Oral (08/12 0557) BP: 109/65 mmHg (08/12 0602) Pulse Rate: 85  (08/12 0559)  Labs:  Basename 11/03/11 0415 11/02/11 2241 11/02/11 2041 11/02/11 2040  HGB 12.3* -- 14.6 --  HCT 38.0* -- 43.0 40.4  PLT 167 -- -- 179  APTT -- -- -- --  LABPROT -- 18.8* -- --  INR -- 1.54* -- --  HEPARINUNFRC -- -- -- --  CREATININE 0.95 -- 1.10 1.06  CKTOTAL -- -- -- --  CKMB -- -- -- --  TROPONINI -- -- -- --    Estimated Creatinine Clearance: 96.1 ml/min (by C-G formula based on Cr of 0.95).   Medications:  Scheduled:    . aspirin  81 mg Oral Daily  . atorvastatin  40 mg Oral q1800  . bisoprolol  5 mg Oral BID  . Chlorhexidine Gluconate Cloth  6 each Topical Q0600  . clindamycin (CLEOCIN) IV  600 mg Intravenous Q8H  . clindamycin (CLEOCIN) IV  900 mg Intravenous Once  . enoxaparin (LOVENOX) injection  130 mg Subcutaneous Once  . enoxaparin (LOVENOX) injection  135 mg Subcutaneous BID  . Fluticasone-Salmeterol  1 puff Inhalation BID  . insulin aspart  0-15 Units Subcutaneous TID WC  . insulin aspart  0-5 Units Subcutaneous QHS  . lisinopril  10 mg Oral Daily  .  morphine injection  4 mg Intravenous Once  . mupirocin ointment  1 application Nasal BID  . sodium chloride  500 mL Intravenous Once  . sodium chloride  3 mL Intravenous Q12H  . Tamsulosin HCl  0.4 mg Oral QPC breakfast  . torsemide  40 mg Oral Daily  . warfarin  7.5 mg Oral Once  . Warfarin - Pharmacist Dosing Inpatient   Does not apply q1800  . DISCONTD: Chlorhexidine Gluconate Cloth  6 each Topical Q0600  . DISCONTD:  mupirocin ointment  1 application Nasal BID    Assessment: 70yo male on Lovenox bridge + Coumadin for possible DVT, in pt with hx DVT/PE and sub-therapeutic INR on admission.  There is no INR today- ordered to start daily INR in AM.  He received his Sunday dose of Coumadin ~3AM today.  No problems noted.  CBC ~stable.  Goal of Therapy:  INR 2-3 Monitor platelets by anticoagulation protocol: Yes   Plan:  1.  Coumadin 7.5mg  tonight, will schedule a little later than usual 6PM given dose early this AM 2.   F/U daily INR  Marisue Humble, PharmD Clinical Pharmacist [facility]

## 2011-11-03 NOTE — ED Provider Notes (Signed)
I saw and evaluated the patient, reviewed the resident's note and I agree with the findings and plan.  Hx venous stasis, PVD, DVT with bilateral LE swelling, redness, drainage x 3days. Multiple open ulcerations with yellow oozing.  DP and PT pulses not palpable.  Glynn Octave, MD 11/03/11 1345

## 2011-11-03 NOTE — Consult Note (Signed)
WOC consult Note Reason for Consult: eval LE wounds.  Pt with hx of venous stasis dx.  He has used Unna's boots in the past to manage edema and has compression stockings at home.  He reports that he was not wearing his stocking because "they were too tight".  He reports fall at home where he hit his legs and since then they have been getting worse.  He has been treating the ulcerations at home with neosporin. He has had ABI July 2013, suggestive that mild arterial dx in the right LE (0.64) and normal in the left (1.0).  Pt is followed by vascular as well.  MD does not want Unna's boots applied until after testing today.  I will see pt again tom and order Unna's boots at that time if dopplers are ok.   Wound type: scattered venous stasis ulcerations of the bil. LE.  R>L.   Measurement: all aprox. 2cm x 2cm with largest on the R pretibial area that is 3.0cm x 2.0cm x 0. 2cm.    Wound bed: moist, yellow wound base, partial thickness  Drainage (amount, consistency, odor) serous with no odor  Periwound: hemosiderin staining of the bil. LE with surrounding erythema consist ant with cellulitis.  Noted venous stasis dermatitis as well.  Small area on the right dorsal foot, may be related to trauma with fall.  Dressing procedure/placement/frequency: silicone foam dressing to the open ulceration to control exudate.  Re eval tomorrow for placement of Unna's boots.  Elevation of legs as much as possible until then for edema control.  Would recommend HHRN at dc for continued compression wraps and follow up with wound care center or vascular to monitor wound status.   WOC will follow along with you for wound care management.  Starnisha Batrez [place], [place] [phone]

## 2011-11-04 LAB — CBC
HCT: 39.6 % (ref 39.0–52.0)
Hemoglobin: 12.8 g/dL — ABNORMAL LOW (ref 13.0–17.0)
RBC: 5.07 MIL/uL (ref 4.22–5.81)
WBC: 10.4 10*3/uL (ref 4.0–10.5)

## 2011-11-04 LAB — GLUCOSE, CAPILLARY
Glucose-Capillary: 191 mg/dL — ABNORMAL HIGH (ref 70–99)
Glucose-Capillary: 264 mg/dL — ABNORMAL HIGH (ref 70–99)
Glucose-Capillary: 190 mg/dL — ABNORMAL HIGH (ref 70–99)

## 2011-11-04 LAB — PROTIME-INR: INR: 1.82 — ABNORMAL HIGH (ref 0.00–1.49)

## 2011-11-04 MED ORDER — WARFARIN SODIUM 7.5 MG PO TABS
7.5000 mg | ORAL_TABLET | Freq: Once | ORAL | Status: AC
  Administered 2011-11-04: 7.5 mg via ORAL
  Filled 2011-11-04: qty 1

## 2011-11-04 MED ORDER — LIVING WELL WITH DIABETES BOOK
Freq: Once | Status: DC
  Filled 2011-11-04: qty 1

## 2011-11-04 MED ORDER — CLINDAMYCIN HCL 300 MG PO CAPS: 600.0000 mg | ORAL_CAPSULE | Freq: Three times a day (TID) | ORAL | Status: AC

## 2011-11-04 NOTE — Progress Notes (Signed)
  RD consulted for nutrition education regarding diabetes.    Lab Results  Component Value Date   HGBA1C 9.8* 11/03/2011    RD attempted to provide education for pt at this time. Pt expresses feeling hopeless in controlling blood glucose and feels he has limited himself to eating only vegetables and sugar-free beverages without improvement.  Pt states since he fell last week, his blood sugars have been uncontrollable. During assessment, pt became emotional and no longer able to engage in discussion.  Stated, "I'm old and fat, I guess I'll just lay here and die."  Pt asked if I could call his wife,  Attempted but no answer.  MD arrived to tell pt he would be discharging today and pt again began to cry after MD left room.  RD consoled, and tried to encourage pt.  I notified RN of events after leaving the room.  Expect good compliance.  Appears pt has made positive change and truly desire good control.  Not achieving blood glucose control despite changes is saddening pt.  Agree with referral to output diabetes classes.  Body mass index is 46.62 kg/(m^2). Pt meets criteria for morbidly obese based on current BMI.  Current diet order is CHO Modified, patient is consuming approximately 95-100% of meals at this time. Labs and medications reviewed. No further nutrition interventions warranted at this time. If additional nutrition issues arise, please re-consult RD.  Loyce Dys, MS RD LDN Clinical Inpatient Dietitian Pager: [phone] Weekend/After hours pager: [phone]

## 2011-11-04 NOTE — Clinical Documentation Improvement (Signed)
POA DOCUMENTATION CLARIFICATION QUERY  THIS DOCUMENT IS NOT A PERMANENT PART OF THE MEDICAL RECORD  TO RESPOND TO THE THIS QUERY, FOLLOW THE INSTRUCTIONS BELOW:  1. If needed, update documentation for the patient's encounter via the notes activity.  2. Access this query again and click edit on the In [employer].  3. After updating, or not, click F2 to complete all highlighted (required) fields concerning your review. Select "additional documentation in the medical record" OR "no additional documentation provided".  4. Click Sign note button.  5. The deficiency will fall out of your In Basket *Please let us know if you are not able to complete this workflow by phone or e-mail (listed below).  Please update your documentation within the medical record to reflect your response to this query.                                                                                    11/04/11  Dear Dr. Grandville Silos [facility]    In a better effort to capture your patient's severity of illness, reflect appropriate length of stay and utilization of resources, a review of the patient medical record has revealed the following indicators.    Based on your clinical judgment, please clarify and document in a progress note and/or discharge summary the clinical condition associated with the following supporting information:  In responding to this query please exercise your independent judgment.  The fact that a query is asked, does not imply that any particular answer is desired or expected.  Medicare rules require specification as to whether an inpatient diagnosis was present at the time of admission.    Noted in previous admission patient with history of "systolic chf" current documentation of "chronic chf " without mention of type.  Please clarify if the following diagnosis,             Chronic Systolic CHF , was:     _ Present at the time of admission  _ NOT present at the time of  inpatient admission and it developed during the inpatient stay  _ Unable to clinically determine whether the condition was present on admission.  _  Documentation insufficient to determine if condition was present at the time of inpatient admission  You may use possible, probable, or suspect with inpatient documentation. possible, probable, suspected diagnoses MUST be documented at the time of discharge  Reviewed: additional documentation in the medical record      Thank You,  Leonette Most Addison  Clinical Documentation Specialist RN, BSN :  Pager [phone] HIM off. # [phone]  Health Information Management [place]

## 2011-11-04 NOTE — Clinical Documentation Improvement (Signed)
Abnormal Labs Clarification  THIS DOCUMENT IS NOT A PERMANENT PART OF THE MEDICAL RECORD  TO RESPOND TO THE THIS QUERY, FOLLOW THE INSTRUCTIONS BELOW:  1. If needed, update documentation for the patient's encounter via the notes activity.  2. Access this query again and click edit on the [employer].  3. After updating, or not, click F2 to complete all highlighted (required) fields concerning your review. Select "additional documentation in the medical record" OR "no additional documentation provided".  4. Click Sign note button.  5. The deficiency will fall out of your InBasket *Please let us know if you are not able to complete this workflow by phone or e-mail (listed below).  Please update your documentation within the medical record to reflect your response to this query.                                                                                   11/04/11  Dear Dr.Waymon Laser Karie Schwalbe Benjamin Stain Marton Redwood  In a better effort to capture your patient's severity of illness, reflect appropriate length of stay and utilization of resources, a review of the medical record has revealed the following indicators.    Based on your clinical judgment, please clarify and document in a progress note and/or discharge summary the clinical condition associated with the following supporting information:  In responding to this query please exercise your independent judgment.  The fact that a query is asked, does not imply that any particular answer is desired or expected.  Abnormal findings (laboratory, x-ray, pathologic, and other diagnostic results) are not coded and reported unless the physician indicates their clinical significance.   The medical record reflects the following clinical findings, please clarify the diagnostic and/or clinical significance:      Noted NA level 132 (8/11) / (8/12)  down from 135 ( 8/11) and 138 ( 7/2 13) if secondary diagnosis is appropriate, please document in the  medical record and d/c summary.    Possible Clinical Conditions?                                Hyponatremia  Hyposmolality  Other Condition:               Cannot Clinically Determine:   Evaluation: BMET x 2   Treatment : Bolus'd w NS 500 ml IV x 1 (8/11),  NS @ [facility]  Reviewed:  no additional documentation provided - I cannot find the encounter this is referring to and hyponatremia is not a current issue in this patient.   Thank You,  Leonette Most Addison  Clinical Documentation Specialist RN, BSN:  Pager [phone] HIM off # [phone] Health Information Management [place]

## 2011-11-04 NOTE — Progress Notes (Signed)
ANTICOAGULATION CONSULT NOTE - Follow Up Consult  Pharmacy Consult for Coumadin Indication: h/o DVT/PE  Allergies  Allergen Reactions  . Diazepam Anxiety    REACTION: makes patient cry    Patient Measurements: Height: 5\' 8"  (172.7 cm) Weight: 306 lb 9.6 oz (139.073 kg) IBW/kg (Calculated) : 68.4   Vital Signs: Temp: 97.6 F (36.4 C) (08/13 0506) BP: 113/56 mmHg (08/13 1026) Pulse Rate: 68  (08/13 1026)  Labs:  Basename 11/04/11 0610 11/03/11 0415 11/02/11 2241 11/02/11 2041 11/02/11 2040  HGB 12.8* 12.3* -- -- --  HCT 39.6 38.0* -- 43.0 --  PLT 155 167 -- -- 179  APTT -- -- -- -- --  LABPROT 21.4* -- 18.8* -- --  INR 1.82* -- 1.54* -- --  HEPARINUNFRC -- -- -- -- --  CREATININE -- 0.95 -- 1.10 1.06  CKTOTAL -- -- -- -- --  CKMB -- -- -- -- --  TROPONINI -- -- -- -- --    Estimated Creatinine Clearance: 96.1 ml/min (by C-G formula based on Cr of 0.95).   Medications:  Scheduled:     . aspirin  81 mg Oral Daily  . atorvastatin  40 mg Oral q1800  . bisoprolol  5 mg Oral BID  . Chlorhexidine Gluconate Cloth  6 each Topical Q0600  . clindamycin  600 mg Oral Q8H  . Fluticasone-Salmeterol  1 puff Inhalation BID  . insulin aspart  0-15 Units Subcutaneous TID WC  . insulin aspart  0-5 Units Subcutaneous QHS  . insulin glargine  10 Units Subcutaneous QHS  . lisinopril  10 mg Oral Daily  . mupirocin ointment  1 application Nasal BID  . sodium chloride  3 mL Intravenous Q12H  . Tamsulosin HCl  0.4 mg Oral QPC breakfast  . torsemide  40 mg Oral Daily  . warfarin  7.5 mg Oral Once  . Warfarin - Pharmacist Dosing Inpatient   Does not apply q1800  . DISCONTD: clindamycin (CLEOCIN) IV  600 mg Intravenous Q8H    Assessment: 70yo male was on Lovneox bridge and coumadin for possible new DVT, in pt with hx DVT/PE and sub-therapeutic INR on admission. B LE dopplers were negative, thus Lovenox stopped. Coumadin continues for h/o DVT/PE.  Hx/o PE 10-15 yrs ago; on lifelong  coumadin.   INR today= 1.82.  CBC ~stable. No bleeding reported. His Coumadin dose PTA was 5mg  daily except 7.5mg  every Monday and Friday.   Goal of Therapy:  INR 2-3 Monitor platelets by anticoagulation protocol: Yes   Plan:  1.  Coumadin 7.5mg  tonight 2.   F/U daily INR  Noah Delaine, RPh Clinical Pharmacist Pager: [phone] 11/04/2011 12:06pm

## 2011-11-04 NOTE — Progress Notes (Signed)
CARE MANAGEMENT NOTE 11/04/2011  Patient:  JOHNNEY, SCARLATA   Account Number:  [id number]  Date Initiated:  11/04/2011  Documentation initiated by:  Vance Peper  Subjective/Objective Assessment:   71 yr old male admitted for bilateral lower extremity edema and cellulitis     Action/Plan:   CM spoke with patient regarding Home Health needs at discharge. Choice offered.   Anticipated DC Date:  11/05/2011   Anticipated DC Plan:  HOME W HOME HEALTH SERVICES      DC Planning Services  CM consult      [facility] Choice  HOME HEALTH   Choice offered to / List presented to:  C-1 Patient        HH arranged  HH-1 RN  HH-10 DISEASE MANAGEMENT      HH agency  [facility]   Status of service:  Completed, signed off Medicare Important Message given?   (If response is "NO", the following Medicare IM given date fields will be blank) Date Medicare IM given:   Date Additional Medicare IM given:    Discharge Disposition:  HOME W HOME HEALTH SERVICES  Per UR Regulation:    If discussed at Long Length of Stay Meetings, dates discussed:    Comments:

## 2011-11-04 NOTE — Progress Notes (Signed)
Orthopedic Tech Progress Note Patient Details:  [name] [birth year] [id number]  Ortho Devices Type of Ortho Device: Roland Rack boot Ortho Device/Splint Location: bilateral Ortho Device/Splint Interventions: Application   Marrian Bells T 11/04/2011, 3:53 PM

## 2011-11-04 NOTE — Progress Notes (Signed)
Inpatient Diabetes Program Recommendations  AACE/ADA: New Consensus Statement on Inpatient Glycemic Control (2013)  Target Ranges:  Prepandial:   less than 140 mg/dL      Peak postprandial:   less than 180 mg/dL (1-2 hours)      Critically ill patients:  140 - 180 mg/dL   Reason for Visit: Hyperglycemia  Inpatient Diabetes Program Recommendations Insulin - Basal: Please either give half of home NPH dose, 35 units bid or increase lantus to 20-30units to start and continue to increase until fasting glucose is controlled. HgbA1C: Ordered. HgbA1C at 9.8 % .   Does patient need OP education?  Will order in-pt education per bedside RN using videos, [facility] exit care notes, ADA booklet, and RD consult for diet ed.

## 2011-11-04 NOTE — Progress Notes (Signed)
PGY-1 Daily Progress Note Family Medicine Teaching Service Simone Curia, MD Service Pager: [phone]   Subjective: Pt with no acute events overnight, states leg pain has decreased some today; denies SOB and is eating well.  Objective:  VITALS Temp:  [97.6 F (36.4 C)-98.1 F (36.7 C)] 97.6 F (36.4 C) (08/13 0506) Pulse Rate:  [62-67] 67  (08/13 0506) Resp:  [18] 18  (08/13 0506) BP: (95-108)/(39-45) 108/39 mmHg (08/13 0506) SpO2:  [95 %-98 %] 95 % (08/13 0506)  In/Out  Intake/Output Summary (Last 24 hours) at 11/04/11 0816 Last data filed at 11/04/11 0500  Gross per 24 hour  Intake    360 ml  Output      0 ml  Net    360 ml    Physical Exam: Gen: NAD, well-developed and obese male lying in bed. HEENT: /AT, wears dentures CV: RRR though distant heart sounds; unable to palpate LE pulses due to edema PULM: distant breath sounds due to body habitus, no increased WOB, no wheezes ABD: soft, non-tender, obese, mild discomfort to lower quadrant palpation, NABS MSK/Extremities: Edema and weepy yellow ulcers circumferentially at bilateral LE below knees appearing superficial; linear surgical scar left upper thigh; left great toe surgically absent.  Healed surgical scars down to medial calves bilaterally. Neuro: Alert, CN 2-12 grossly in tact  MEDS - Reviewed  Labs and imaging:   CBC  Lab 11/04/11 0610 11/03/11 0415 11/02/11 2041 11/02/11 2040  WBC 10.4 10.0 -- 11.2*  HGB 12.8* 12.3* 14.6 --  HCT 39.6 38.0* 43.0 --  PLT 155 167 -- 179   BMET/CMET  Lab 11/03/11 0415 11/02/11 2041 11/02/11 2040  NA 132* 135 132*  K 4.4 4.8 4.8  CL 98 99 94*  CO2 27 -- 28  BUN 20 23 22   CREATININE 0.95 1.10 1.06  CALCIUM 8.9 -- 9.4  PROT -- -- --  BILITOT -- -- --  ALKPHOS -- -- --  ALT -- -- --  AST -- -- --  GLUCOSE 287* 362* 390*   INR 1.82   Assessment and Plan:   71 yo M with extensive PMH noted above presenting for 3 days of worsening bilateral lower extremity  edema, erythema and pain prior to admission.  # Venous stasis ulcers v cellulitis - Though INR subtherapeutic on admission, LE dopplers showed no signs of thrombus. No signs of osteomyelitis or deep wounds at this time. Pt admitted, received lovenox bridge which was stopped once B LE dopplers showed no signs of clot.  Legs appear slightly less edematous today after elevated yesterday. - Continue coumadin dosed per pharmacy as pt has h/o DVT - For possible venous stasis ulcers, previous unna boot. Wound care to place Unna boot today and recommend HHRN to help with boot at home. Pt to likely need long-term compression therapy for legs. For now, keep legs elevated - replaced pillows under pt legs today.  - For possible cellulitis, Clinda started in ED b/c h/o red man syndrome with Vanc. Patient had fall w/ abrasions to the knees which could be a source of infection, but the scabs are healing well without surrounding erythema. Will continue Clinda for now, especially in setting of elevated WBC.  - Pain:Tylenol #3 (home medication) and Morphine as needed for break through pain  - Patient is followed by vascular surgery for chronic PVD with recent ABI. No need to consult at this time  - F/u wound culture, obtained from lanced pustule yesterday  # HTN/Chronic CHF- BP stable  at admission. No signs of fluid overload  - Continue home medications  - IVF saline locked - Can consider extra diuretic, if needed  - Continue to monitor BP   # DM- Glucose in the 300's in the ED  - Start SSI qac/hs. Added 10 units lantus yesterday and CBG still elevated in 190s.  Consider switching to home dose of NPH today if patient stays.   # COPD- Patient is not a smoker, but is exposed to smoke by wife's children. Currently stable with no reported symptoms and lungs CTA on physical exam.  - Continue home inhalers  - Supplemental O2 as needed for symptomatic relief or desats   # FEN/GI- Carb modified diet  # PPx- Coumadin  per pharmacy  # Dispo- Pending clinical improvement and further work up   Simone Curia, MD FMTS PGY-1

## 2011-11-04 NOTE — Discharge Summary (Signed)
Discharge Summary 11/04/2011 8:54 PM  [name] DOB: [birth year] MRN: [id number]  Date of Admission: 11/02/2011 Date of Discharge: 11/04/2011  PCP: Renold Don, MD Consultants: Wound Care  Reason for Admission: Bilateral lower extremity edema, erythema, and pain  Discharge Diagnosis Primary 1. Probable venous stasis ulcers v cellulitis Secondary 1. HTN 2. Chronic systolic CHF 3. DM 4. COPD   Hospital Course:  71 yo M with extensive PMH noted above presenting for 3 days of worsening bilateral lower extremity edema, erythema and pain prior to admission.   # Probable venous stasis ulcers v cellulitis - Though INR subtherapeutic on admission, LE dopplers showed no signs of thrombus. No signs of osteomyelitis or deep wounds at this time. Pt admitted, received lovenox bridge and coumadin; lovenox was stopped once B LE dopplers showed no signs of clot. Wound care was consulted, legs were elevated until unna boots could be placed.  Blister was cultured and pending on discharge.  Pt discharged with [facility] set up to help with boot at home. Pt to likely need long-term compression therapy for legs. For possible cellulitis, Clinda started in ED b/c h/o red man syndrome with Vanc.   # HTN - Pt continued on home lisinopril 10 mg and demedex.  # Chronic systolic CHF present on admission - EF 35% in 05/2005 echo, 45-50% in 11/2009 echo; BP stable at admission. No signs of fluid overload.  Pt continued on home lisinopril and demadex, and was stable through course.  # DM- Glucose in the 300's in the ED, SSI and lantus 10 units daily.  Discharged on home NPH.   # COPD- Patient is not a smoker, but is exposed to smoke by wife's children. Currently stable with no reported symptoms and lungs CTA on physical exam.  Continued home inhalers and supplemental O2 as needed for symptomatic relief.  Procedures: None  Discharge Medications Medication List  As of 11/04/2011  8:54 PM   START taking these  medications         clindamycin 300 MG capsule   Commonly known as: CLEOCIN   Take 2 capsules (600 mg total) by mouth every 8 (eight) hours.         CONTINUE taking these medications         acetaminophen-codeine 300-30 MG per tablet   Commonly known as: TYLENOL #3      * albuterol 108 (90 BASE) MCG/ACT inhaler   Commonly known as: PROVENTIL HFA;VENTOLIN HFA      * albuterol (2.5 MG/3ML) 0.083% nebulizer solution   Commonly known as: PROVENTIL      BABY ASPIRIN 81 MG chewable tablet   Generic drug: aspirin      bisoprolol 5 MG tablet   Commonly known as: ZEBETA      Fluticasone-Salmeterol 100-50 MCG/DOSE Aepb   Commonly known as: ADVAIR      insulin NPH 100 UNIT/ML injection   Commonly known as: HUMULIN N,NOVOLIN N      lisinopril 10 MG tablet   Commonly known as: PRINIVIL,ZESTRIL   Take 1 tablet (10 mg total) by mouth daily.      nitroGLYCERIN 0.4 MG SL tablet   Commonly known as: NITROSTAT      simvastatin 80 MG tablet   Commonly known as: ZOCOR      Tamsulosin HCl 0.4 MG Caps   Commonly known as: FLOMAX      torsemide 20 MG tablet   Commonly known as: DEMADEX      traMADol 50 MG tablet  Commonly known as: ULTRAM      venlafaxine XR 37.5 MG 24 hr capsule   Commonly known as: EFFEXOR-XR      warfarin 5 MG tablet   Commonly known as: COUMADIN     * Notice: This list has 2 medication(s) that are the same as other medications prescribed for you. Read the directions carefully, and ask your doctor or other care provider to review them with you.        Where to get your medications    These are the prescriptions that you need to pick up. We sent them to a specific pharmacy, so you will need to go there to get them.   [facility] - [place], [place] - [address]    [address] [place] [address]    Phone: [phone]        clindamycin 300 MG capsule            Pertinent Hospital Labs CBC    Component Value  Date/Time   WBC 10.4 11/04/2011 0610   RBC 5.07 11/04/2011 0610   HGB 12.8* 11/04/2011 0610   HCT 39.6 11/04/2011 0610   PLT 155 11/04/2011 0610   MCV 78.1 11/04/2011 0610   MCH 25.2* 11/04/2011 0610   MCHC 32.3 11/04/2011 0610   RDW 18.3* 11/04/2011 0610   LYMPHSABS 1.4 11/02/2011 2040   MONOABS 1.0 11/02/2011 2040   EOSABS 0.3 11/02/2011 2040   BASOSABS 0.0 11/02/2011 2040    BMET    Component Value Date/Time   NA 132* 11/03/2011 0415   K 4.4 11/03/2011 0415   CL 98 11/03/2011 0415   CO2 27 11/03/2011 0415   GLUCOSE 287* 11/03/2011 0415   BUN 20 11/03/2011 0415   CREATININE 0.95 11/03/2011 0415   CREATININE 1.12 09/23/2011 0920   CALCIUM 8.9 11/03/2011 0415   GFRNONAA 81* 11/03/2011 0415   GFRAA >90 11/03/2011 0415   INR 1.54--> 1.82     Discharge instructions: See AVS  Condition at discharge: stable  Disposition: Home  Pending Tests: Wound culture  Follow up: Follow-up Information    Follow up with [facility], MD on 11/10/2011. (at 9:30 AM - please arrive 10 minutes prior to appointment time)    Contact information:   [address] [place] [address] [phone]       Follow up with Renold Don, MD on 11/06/2011. (For INR Check at 930 am)    Contact information:   [address] [place] [address] [phone]          Follow up Issues:  - F/u wound culture results  Simone Curia 11/04/2011 9:19 PM

## 2011-11-04 NOTE — Progress Notes (Signed)
Family Medicine Teaching Service Attending Note  I interviewed and examined patient [name] and reviewed their tests and x-rays.  I discussed with Dr. Benjamin Stain and reviewed their note for today.  I agree with their assessment and plan.     Additionally  Feeling better today Trying to keep legs elevated Follow wound care's recommendations.   Would use antibiotics for short course 5-7 days with close monitoring of his legs while in una boots

## 2011-11-05 NOTE — Discharge Summary (Signed)
I have reviewed this discharge summary and agree.    

## 2011-11-06 ENCOUNTER — Ambulatory Visit: Payer: Medicare Other

## 2011-11-06 LAB — WOUND CULTURE: Gram Stain: NONE SEEN

## 2011-11-10 ENCOUNTER — Encounter: Payer: Self-pay | Admitting: Family Medicine

## 2011-11-10 ENCOUNTER — Ambulatory Visit (INDEPENDENT_AMBULATORY_CARE_PROVIDER_SITE_OTHER): Payer: Medicare Other | Admitting: Family Medicine

## 2011-11-10 VITALS — BP 100/60 | HR 70 | Temp 98.2°F | Ht 68.0 in | Wt 292.0 lb

## 2011-11-10 DIAGNOSIS — R609 Edema, unspecified: Secondary | ICD-10-CM

## 2011-11-10 DIAGNOSIS — R600 Localized edema: Secondary | ICD-10-CM

## 2011-11-10 DIAGNOSIS — K219 Gastro-esophageal reflux disease without esophagitis: Secondary | ICD-10-CM

## 2011-11-10 DIAGNOSIS — R197 Diarrhea, unspecified: Secondary | ICD-10-CM

## 2011-11-10 DIAGNOSIS — R110 Nausea: Secondary | ICD-10-CM | POA: Insufficient documentation

## 2011-11-10 MED ORDER — OMEPRAZOLE 40 MG PO CPDR: 40.0000 mg | DELAYED_RELEASE_CAPSULE | Freq: Every day | ORAL | Status: DC

## 2011-11-10 MED ORDER — PROMETHAZINE HCL 12.5 MG PO TABS: 12.5000 mg | ORAL_TABLET | Freq: Three times a day (TID) | ORAL | Status: DC | PRN

## 2011-11-10 NOTE — Patient Instructions (Addendum)
For your nausea:  Take the Prilosec everyday, even if you feel okay. Take the Phenergan just when you need it every 8 hours if you need to.  Bring in a stool sample when you come back tomorrow.  We will re-wrap your leg today.

## 2011-11-10 NOTE — Assessment & Plan Note (Signed)
Concern of course is C. difficile colitis and patient currently taking clindamycin. We'll obtain stool samples and he is to bring these back tomorrow. He is also having his INR checked tomorrow as well.

## 2011-11-10 NOTE — Progress Notes (Signed)
Patient ID: DEMARIOUS KAPUR, male   DOB: [birth year], 71 y.o.   MRN: [id number] RAHEEM KOLBE is a 71 y.o. male who presents to [facility] today for hospital follow-up for leg cellulitis and venous swelling:  1.  Diarrhea:  Was discharged home on Clindamycin for lower extremity cellulitis.  Last dose of Clindamycin was Saturday morning.  Having 4-5 episodes a day.  No blood or melena.  Does have some mild nausea and has persistent brash especially after meals. No abdominal pain..  Eating and drinking well. No lightheadedness. No dizziness. No fevers or chills.  2.  GERD:  Usually worse with acidic drinks.  Now having reflux and brash symptoms after even drinking water.  Trying to eat smaller, more frequent meals.  No dyspnea (past baseline).  Feels like this has worsened since started Clindamycin.  Was not previously on any reflux medications.  3.  Leg swelling:  Much improved since leaving hospital.  Unna boots removed last night.  Has home health nurse coming by once a week.  He is down several pounds and is closer to his baseline weight. No dyspnea or difficulty breathing.    The following portions of the patient's history were reviewed and updated as appropriate: allergies, current medications, past medical history, family and social history, and problem list.  Patient is a nonsmoker.  Past Medical History  Diagnosis Date  . Depression   . Diabetes mellitus   . COPD (chronic obstructive pulmonary disease)   . Adhesive capsulitis   . MRSA bacteremia     2011 - possible endocarditis, received 6 weeks IV treatment  . Hyperlipidemia   . Anxiety   . GERD (gastroesophageal reflux disease)   . CAD (coronary artery disease)   . 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 CVA (cerebral infarction) Questionable history  . Diverticulosis   . Osteomyelitis 2012    Sternoclavicular joint   . Chronic back pain   . Angina   . Myocardial infarction   . CHF (congestive heart failure)    . Shortness of breath   . Sleep apnea   . Chronic kidney disease     hx of BPH  . Neuromuscular disorder     HX of diabetic periferal neuropathy  . Hypertension   . Peripheral vascular disease     ROS as above otherwise neg. No Chest pain, palpitations, SOB,.  Medications reviewed. Current Outpatient Prescriptions  Medication Sig Dispense Refill  . acetaminophen-codeine (TYLENOL #3) 300-30 MG per tablet Take 1 tablet by mouth every 6 (six) hours as needed. For pain.      Marland Kitchen albuterol (PROVENTIL HFA;VENTOLIN HFA) 108 (90 BASE) MCG/ACT inhaler Inhale 2 puffs into the lungs every 4 (four) hours as needed. For shortness of breath      . albuterol (PROVENTIL) (2.5 MG/3ML) 0.083% nebulizer solution Take 2.5 mg by nebulization every 6 (six) hours as needed. For shortness of breath      . aspirin (BABY ASPIRIN) 81 MG chewable tablet Chew 81 mg by mouth daily.       . bisoprolol (ZEBETA) 5 MG tablet Take 5 mg by mouth 2 (two) times daily.      . clindamycin (CLEOCIN) 300 MG capsule Take 2 capsules (600 mg total) by mouth every 8 (eight) hours.  49 capsule  0  . Fluticasone-Salmeterol (ADVAIR) 100-50 MCG/DOSE AEPB Inhale 1 puff into the lungs 2 (two) times daily.      . insulin NPH (HUMULIN  N,NOVOLIN N) 100 UNIT/ML injection Inject 70 Units into the skin 2 (two) times daily.      Marland Kitchen lisinopril (PRINIVIL,ZESTRIL) 10 MG tablet Take 1 tablet (10 mg total) by mouth daily.  90 tablet  2  . nitroGLYCERIN (NITROSTAT) 0.4 MG SL tablet Place 0.4 mg under the tongue as needed. For chest pains      . simvastatin (ZOCOR) 80 MG tablet Take 80 mg by mouth at bedtime.      . Tamsulosin HCl (FLOMAX) 0.4 MG CAPS Take 0.4 mg by mouth daily after breakfast.      . torsemide (DEMADEX) 20 MG tablet Take 40 mg by mouth daily.      . traMADol (ULTRAM) 50 MG tablet Take 100 mg by mouth every 8 (eight) hours as needed. For pain      . venlafaxine (EFFEXOR-XR) 37.5 MG 24 hr capsule Take 37.5 mg by mouth daily.      Marland Kitchen  warfarin (COUMADIN) 5 MG tablet Take 5-7.5 mg by mouth daily. Take 5mg  every day, except take 7.5mg  on Monday and Friday        Exam:  BP 100/60  Pulse 70  Temp 98.2 F (36.8 C) (Oral)  Ht 5\' 8"  (1.727 m)  Wt 292 lb (132.45 kg)  BMI 44.40 kg/m2 Gen: Well NAD, obese.   HEENT: EOMI,  MMM Lungs: Minimal wheezing at bases but otherwise good aeration. No rales noted. Heart: RRR no MRG Abd: Obese. Good bowel sounds throughout. Nontender throughout. Exts: +1 edema bilateral ankles. Much improved from prior exam. Still with slight wound centimeter in size on pretibial/anterior aspect of right tibia. No weeping or fluctuance or any purulence noted.  No results found for this or any previous visit (from the past 72 hour(s)).

## 2011-11-10 NOTE — Assessment & Plan Note (Signed)
Question if this is secondary to clindamycin use. Phenergan for relief. Also starting Prilosec for GERD. No fevers or chills. No abdominal pain. No concerning red flags. He is eating and drinking well. Followup later this week the patient is doing okay.

## 2011-11-10 NOTE — Assessment & Plan Note (Signed)
Patient denies any history of reflux with me. However I do note reflux esophagitis is included in his problem list. He is not currently on any PPIs and has not been for several years. Plan to start patient on Prilosec prescription strength today. Follow up later this week.

## 2011-11-10 NOTE — Assessment & Plan Note (Signed)
Much improved, his legs are better today than I have seen in several weeks. Plan to continue current torsemide use. No unna boots today Followup 1 week to assess for continued improvement. He is also down several pounds, closer to baseline weight.

## 2011-11-11 ENCOUNTER — Ambulatory Visit (INDEPENDENT_AMBULATORY_CARE_PROVIDER_SITE_OTHER): Payer: Medicare Other | Admitting: *Deleted

## 2011-11-11 DIAGNOSIS — Z7901 Long term (current) use of anticoagulants: Secondary | ICD-10-CM

## 2011-11-11 DIAGNOSIS — Z86718 Personal history of other venous thrombosis and embolism: Secondary | ICD-10-CM

## 2011-11-11 LAB — POCT INR: INR: 1.4

## 2011-11-13 ENCOUNTER — Telehealth: Payer: Self-pay | Admitting: *Deleted

## 2011-11-13 NOTE — Telephone Encounter (Signed)
[facility] has called and informed us that [name], [name] has declined his nurse visit for today. Dr. Gwendolyn Grant has been informed of this.[name]

## 2011-11-18 ENCOUNTER — Ambulatory Visit (INDEPENDENT_AMBULATORY_CARE_PROVIDER_SITE_OTHER): Payer: Medicare Other | Admitting: *Deleted

## 2011-11-18 DIAGNOSIS — Z7901 Long term (current) use of anticoagulants: Secondary | ICD-10-CM

## 2011-11-18 DIAGNOSIS — Z86718 Personal history of other venous thrombosis and embolism: Secondary | ICD-10-CM

## 2011-11-18 LAB — POCT INR: INR: 2.2

## 2011-11-19 ENCOUNTER — Other Ambulatory Visit: Payer: Self-pay | Admitting: Family Medicine

## 2011-11-20 ENCOUNTER — Encounter: Payer: Self-pay | Admitting: Cardiology

## 2011-11-20 ENCOUNTER — Ambulatory Visit (INDEPENDENT_AMBULATORY_CARE_PROVIDER_SITE_OTHER): Payer: Medicare Other | Admitting: Cardiology

## 2011-11-20 ENCOUNTER — Other Ambulatory Visit: Payer: Self-pay | Admitting: *Deleted

## 2011-11-20 VITALS — BP 100/60 | HR 103 | Ht 68.0 in | Wt 297.0 lb

## 2011-11-20 DIAGNOSIS — J449 Chronic obstructive pulmonary disease, unspecified: Secondary | ICD-10-CM

## 2011-11-20 DIAGNOSIS — I2510 Atherosclerotic heart disease of native coronary artery without angina pectoris: Secondary | ICD-10-CM

## 2011-11-20 DIAGNOSIS — E1159 Type 2 diabetes mellitus with other circulatory complications: Secondary | ICD-10-CM

## 2011-11-20 DIAGNOSIS — IMO0001 Reserved for inherently not codable concepts without codable children: Secondary | ICD-10-CM

## 2011-11-20 DIAGNOSIS — I798 Other disorders of arteries, arterioles and capillaries in diseases classified elsewhere: Secondary | ICD-10-CM

## 2011-11-20 MED ORDER — TORSEMIDE 20 MG PO TABS: 20.0000 mg | ORAL_TABLET | Freq: Every day | ORAL | Status: DC

## 2011-11-20 NOTE — Progress Notes (Signed)
[name] Date of Birth:  03/12/1941 [facility] [address] [place], [place]  [address] [phone]  Fax   [phone]  HPI: This pleasant 71 year old gentleman is seen for a scheduled four-month followup office visit. He has a complex past medical history. 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 He has been on long-term Coumadin because of angina decubitus. In addition the patient has exogenous obesity, hypercholesterolemia, diabetes, osteoarthritis, and venous stasis dermatitis. He has adult onset diabetes mellitus. He has COPD. he no longer smokes   Current Outpatient Prescriptions  Medication Sig Dispense Refill  . acetaminophen-codeine (TYLENOL #3) 300-30 MG per tablet Take 1 tablet by mouth every 6 (six) hours as needed. For pain.      Marland Kitchen albuterol (PROVENTIL HFA;VENTOLIN HFA) 108 (90 BASE) MCG/ACT inhaler Inhale 2 puffs into the lungs every 4 (four) hours as needed. For shortness of breath      . albuterol (PROVENTIL) (2.5 MG/3ML) 0.083% nebulizer solution Take 2.5 mg by nebulization every 6 (six) hours as needed. For shortness of breath      . aspirin (BABY ASPIRIN) 81 MG chewable tablet Chew 81 mg by mouth daily.       . bisoprolol (ZEBETA) 5 MG tablet Take 5 mg by mouth 2 (two) times daily.      . Fluticasone-Salmeterol (ADVAIR) 100-50 MCG/DOSE AEPB Inhale 1 puff into the lungs 2 (two) times daily.      . insulin NPH (HUMULIN N,NOVOLIN N) 100 UNIT/ML injection Inject 70 Units into the skin 2 (two) times daily.      Marland Kitchen lisinopril (PRINIVIL,ZESTRIL) 10 MG tablet Take 1 tablet (10 mg total) by mouth daily.  90 tablet  2  . nitroGLYCERIN (NITROSTAT) 0.4 MG SL tablet Place 0.4 mg under the tongue as needed. For chest pains      . omeprazole (PRILOSEC) 40 MG capsule Take 1 capsule (40 mg total) by mouth daily.  30 capsule  3  . simvastatin (ZOCOR) 80 MG tablet Take 80 mg by  mouth at bedtime.      . Tamsulosin HCl (FLOMAX) 0.4 MG CAPS Take 0.4 mg by mouth daily after breakfast.      . torsemide (DEMADEX) 20 MG tablet TAKE TWO TABLETS BY MOUTH EVERY DAY  60 tablet  3  . traMADol (ULTRAM) 50 MG tablet Take 100 mg by mouth every 8 (eight) hours as needed. For pain      . venlafaxine (EFFEXOR-XR) 37.5 MG 24 hr capsule Take 37.5 mg by mouth daily.      Marland Kitchen warfarin (COUMADIN) 5 MG tablet Take 5-7.5 mg by mouth daily. Take 5mg  every day, except take 7.5mg  on Monday and Friday      . promethazine (PHENERGAN) 12.5 MG tablet Take 1 tablet (12.5 mg total) by mouth every 8 (eight) hours as needed for nausea.  20 tablet  0  . DISCONTD: torsemide (DEMADEX) 20 MG tablet Take 40 mg by mouth daily.        Allergies  Allergen Reactions  . Diazepam Anxiety    REACTION: makes patient cry    Patient Active Problem List  Diagnosis  . DIABETIC PERIPHERAL NEUROPATHY  . HYPERCHOLESTEROLEMIA  . ANXIETY  . DEPRESSION  . HYPERTENSION  . CORONARY, ARTERIOSCLEROSIS  . CHF - EJECTION FRACTION < 50%  . REFLUX ESOPHAGITIS  . FROZEN RIGHT SHOULDER  . APNEA, SLEEP  . Encounter for long-term (current) use of anticoagulants  .  BPH (benign prostatic hyperplasia)  . Stasis dermatitis  . COPD (chronic obstructive pulmonary disease)  . Osteomyelitis  . Vancomycin adverse reaction  . Cellulitis of left leg  . History of osteomyelitis  . Peripheral vascular disease, unspecified  . Type II or unspecified type diabetes mellitus with peripheral circulatory disorders, uncontrolled  . Toe amputation status  . Fatigue  . Swelling of left lower extremity  . Leg edema  . Knee pain, bilateral  . GERD (gastroesophageal reflux disease)  . Nausea alone  . Diarrhea    History  Smoking status  . Former Smoker  . Quit date: 06/19/2000  Smokeless tobacco  . Never Used    History  Alcohol Use No    Family History  Problem Relation Age of Onset  . Heart disease Father     Review of  Systems: The patient denies any heat or cold intolerance.  No weight gain or weight loss.  The patient denies headaches or blurry vision.  There is no cough or sputum production.  The patient denies dizziness.  There is no hematuria or hematochezia.  The patient denies any muscle aches or arthritis.  The patient denies any rash.  The patient denies frequent falling or instability.  There is no history of depression or anxiety.  All other systems were reviewed and are negative.   Physical Exam: Filed Vitals:   11/20/11 1205  BP: 100/60  Pulse: 103   general appearance reveals a very large gentleman no acute distress.Pupils equal and reactive.   Extraocular Movements are full.  There is no scleral icterus.  The mouth and pharynx are normal.  The neck is supple.  The carotids reveal no bruits.  The jugular venous pressure is normal.  The thyroid is not enlarged.  There is no lymphadenopathy.  The chest is clear to percussion and auscultation. There are no rales or rhonchi. Expansion of the chest is symmetrical.  The precordium is quiet.  The first heart sound is normal.  The second heart sound is physiologically split.  There is no murmur gallop rub or click.  There is no abnormal lift or heave.  Is massively obese and nontender Extremities show chronic bilateral pretibial and pedal edema.  He has an ulcer which is presently covered with a dressing on the medial aspect of his left lower leg.  The home health nurse has been coming out to his house to attend to his ulcer  EKG today shows sinus tachycardia with nonspecific ST-T wave abnormalities.  Assessment / Plan: Continue same medication.  Work harder on weight loss.  Recheck in 4 months.

## 2011-11-20 NOTE — Assessment & Plan Note (Signed)
The patient states that his blood sugars are still quite variable.  Last night at home his blood sugar was 400.

## 2011-11-20 NOTE — Patient Instructions (Addendum)
Your physician recommends that you continue on your current medications as directed. Please refer to the Current Medication list given to you today.  Your physician recommends that you schedule a follow-up appointment in: 4 months  

## 2011-11-20 NOTE — Assessment & Plan Note (Signed)
The patient has occasional chest pain relieved by sublingual nitroglycerin.  He had an episode of chest pain yesterday at rest relieved by a single nitroglycerin sublingually.  He has compensated congestive heart failure.  He sleeps in a bed with several pillows under his head.  He has not been having any paroxysmal nocturnal dyspnea. He has chronic peripheral edema.

## 2011-12-03 ENCOUNTER — Ambulatory Visit (INDEPENDENT_AMBULATORY_CARE_PROVIDER_SITE_OTHER): Payer: Medicare Other | Admitting: *Deleted

## 2011-12-03 DIAGNOSIS — Z7901 Long term (current) use of anticoagulants: Secondary | ICD-10-CM

## 2011-12-03 DIAGNOSIS — Z86718 Personal history of other venous thrombosis and embolism: Secondary | ICD-10-CM

## 2011-12-17 ENCOUNTER — Ambulatory Visit: Payer: Medicare Other

## 2011-12-24 ENCOUNTER — Ambulatory Visit (INDEPENDENT_AMBULATORY_CARE_PROVIDER_SITE_OTHER): Payer: Medicare Other | Admitting: *Deleted

## 2011-12-24 DIAGNOSIS — Z7901 Long term (current) use of anticoagulants: Secondary | ICD-10-CM

## 2011-12-24 DIAGNOSIS — Z86718 Personal history of other venous thrombosis and embolism: Secondary | ICD-10-CM

## 2011-12-31 ENCOUNTER — Ambulatory Visit: Payer: Medicare Other

## 2012-01-07 ENCOUNTER — Ambulatory Visit: Payer: Medicare Other

## 2012-01-08 ENCOUNTER — Ambulatory Visit (INDEPENDENT_AMBULATORY_CARE_PROVIDER_SITE_OTHER): Payer: Medicare Other | Admitting: Family Medicine

## 2012-01-08 ENCOUNTER — Ambulatory Visit (INDEPENDENT_AMBULATORY_CARE_PROVIDER_SITE_OTHER): Payer: Medicare Other | Admitting: *Deleted

## 2012-01-08 VITALS — BP 129/78 | HR 80 | Ht 68.0 in | Wt 293.0 lb

## 2012-01-08 DIAGNOSIS — I208 Other forms of angina pectoris: Secondary | ICD-10-CM

## 2012-01-08 DIAGNOSIS — I209 Angina pectoris, unspecified: Secondary | ICD-10-CM

## 2012-01-08 DIAGNOSIS — Z86718 Personal history of other venous thrombosis and embolism: Secondary | ICD-10-CM

## 2012-01-08 DIAGNOSIS — Z7901 Long term (current) use of anticoagulants: Secondary | ICD-10-CM

## 2012-01-08 DIAGNOSIS — I2510 Atherosclerotic heart disease of native coronary artery without angina pectoris: Secondary | ICD-10-CM

## 2012-01-08 LAB — POCT INR: INR: 2.9

## 2012-01-08 MED ORDER — BISOPROLOL FUMARATE 10 MG PO TABS: 10.0000 mg | ORAL_TABLET | Freq: Two times a day (BID) | ORAL | Status: DC

## 2012-01-08 MED ORDER — ISOSORBIDE MONONITRATE ER 30 MG PO TB24: 30.0000 mg | ORAL_TABLET | Freq: Every day | ORAL | Status: DC

## 2012-01-08 NOTE — Progress Notes (Signed)
  Subjective:    Patient ID: [name], male    DOB: 1940/03/28, 71 y.o.   MRN: [id number]  HPI  Mr. Barbary comes in after an episode of chest pain, with neck radiation and arm numbness that happened Monday.  It was relieved by Nitro after 30 minutes.  He says it felt like a past heart attack, but he did not want to go to the hospital then, nor does he want to go now.  He does not have chest pain now, but does have some dyspnea with exertion.  He is scheduled to see his cardiologist at the end of December.    He recently moved, and some of his medications are in a shed that is down a hill.  It is hard for him to get down there.  He is not sure exactly which medications he is taking, but knows he is taking the coumadin.    Past Medical History  Diagnosis Date  . Depression   . Diabetes mellitus   . COPD (chronic obstructive pulmonary disease)   . Adhesive capsulitis   . MRSA bacteremia     2011 - possible endocarditis, received 6 weeks IV treatment  . Hyperlipidemia   . Anxiety   . GERD (gastroesophageal reflux disease)   . CAD (coronary artery disease)   . 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 CVA (cerebral infarction) Questionable history  . Diverticulosis   . Osteomyelitis 2012    Sternoclavicular joint   . Chronic back pain   . Angina   . Myocardial infarction   . CHF (congestive heart failure)   . Shortness of breath   . Sleep apnea   . Chronic kidney disease     hx of BPH  . Neuromuscular disorder     HX of diabetic periferal neuropathy  . Hypertension   . Peripheral vascular disease    Family History  Problem Relation Age of Onset  . Heart disease Father    History  Substance Use Topics  . Smoking status: Former Smoker    Quit date: 06/19/2000  . Smokeless tobacco: Never Used  . Alcohol Use: No    Review of Systems See HPI    Objective:   Physical Exam BP 129/78  Pulse 80  Ht 5\' 8"  (1.727 m)  Wt 293 lb (132.904 kg)   BMI 44.55 kg/m2 General appearance: alert, cooperative and no distress Lungs: clear to auscultation bilaterally Heart: regular rate and rhythm, S1, S2 normal, no murmur, click, rub or gallop Extremities: extremities normal, atraumatic, no cyanosis or edema Neurologic: Cranial nerves: normal       Assessment & Plan:

## 2012-01-08 NOTE — Patient Instructions (Signed)
I am sorry you are not feeling well.  I have increased your Bisprolol (Zebeta) to 10 mg by mouth twice a day (up from 5 mg by mouth twice a day).  Please start taking this new dose tonight, you can take two of the pills you have at home.   I am also adding Imdur, which is like a long-acting nitro to help with chest pain. Please take one pill daily.   I want you to see Dr. Gwendolyn Grant early next week.

## 2012-01-08 NOTE — Assessment & Plan Note (Signed)
Stressed importance of taking aspirin, BB, BP medications, coumadin, simvastatin daily.  Asked patient to follow up with Dr. Gwendolyn Grant next week and bring all the medications he is taking.  Also, will ask social work if there is any way to assist patient.

## 2012-01-08 NOTE — Assessment & Plan Note (Signed)
Will increase bisoprolol and start Imdur for symptomatic relief.  Also recommended he call his Cardiologist and ask to move up his appointment.

## 2012-01-12 ENCOUNTER — Encounter: Payer: Self-pay | Admitting: Family Medicine

## 2012-01-12 ENCOUNTER — Ambulatory Visit (INDEPENDENT_AMBULATORY_CARE_PROVIDER_SITE_OTHER): Payer: Medicare Other | Admitting: Family Medicine

## 2012-01-12 VITALS — BP 96/53 | HR 87 | Temp 97.8°F | Wt 295.0 lb

## 2012-01-12 DIAGNOSIS — I209 Angina pectoris, unspecified: Secondary | ICD-10-CM

## 2012-01-12 DIAGNOSIS — K649 Unspecified hemorrhoids: Secondary | ICD-10-CM

## 2012-01-12 DIAGNOSIS — F329 Major depressive disorder, single episode, unspecified: Secondary | ICD-10-CM

## 2012-01-12 DIAGNOSIS — M7500 Adhesive capsulitis of unspecified shoulder: Secondary | ICD-10-CM

## 2012-01-12 MED ORDER — ACETAMINOPHEN-CODEINE #4 300-60 MG PO TABS: 1.0000 | ORAL_TABLET | ORAL | Status: DC | PRN

## 2012-01-12 NOTE — Assessment & Plan Note (Signed)
Increasing from Tylenol 3 to #4 due to persistent pain.

## 2012-01-12 NOTE — Assessment & Plan Note (Signed)
Acutely worsened this past week.  He is doing at baseline on increased Imdur and bisoprolol.   Again recommended he move up Cardiology appointment. His wife told me he had already done this but on record review he has not.   Stressed importance of calling us or EMS if he has recurrence of his chest pain in future.

## 2012-01-12 NOTE — Patient Instructions (Signed)
If you're having pain like you did before, call us or call EMS.  I will have Theresia Bough our social worker give you a call as well.

## 2012-01-12 NOTE — Assessment & Plan Note (Signed)
Acutely worsened after daughter-in-law moved back in. He likes his new house but would rather live in a nursing home because he has trouble with the anxiety caused by living with his daughter in law.   Will refer to social work for any options he may have including home health aide.

## 2012-01-12 NOTE — Assessment & Plan Note (Signed)
Patient refused rectal examination today. States he was hemorrhoid cream at home that helps, but he cannot reach his buttocks due to obesity.   Would be open to home health aid coming out and applying cream if possible.
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
## 2012-01-13 ENCOUNTER — Telehealth: Payer: Self-pay | Admitting: Clinical

## 2012-01-13 NOTE — Telephone Encounter (Signed)
Clinical Child psychotherapist (CSW) received a referral to contact pt & provide support for [facility] services or placement. Pt informed CSW he was busy and would contact CSW at a later time.  CSW has spoken to pt PCP as [facility] services seem to be more appropriate for pt at this time. CSW will be available for additional support.  Theresia Bough, MSW, Theresia [facility]ors [phone]

## 2012-01-22 ENCOUNTER — Ambulatory Visit (INDEPENDENT_AMBULATORY_CARE_PROVIDER_SITE_OTHER): Payer: Medicare Other | Admitting: Family Medicine

## 2012-01-22 ENCOUNTER — Encounter: Payer: Self-pay | Admitting: Family Medicine

## 2012-01-22 ENCOUNTER — Ambulatory Visit: Payer: Medicare Other | Admitting: *Deleted

## 2012-01-22 VITALS — BP 89/41 | HR 53 | Temp 98.3°F | Ht 68.0 in | Wt 293.0 lb

## 2012-01-22 DIAGNOSIS — Z7901 Long term (current) use of anticoagulants: Secondary | ICD-10-CM

## 2012-01-22 DIAGNOSIS — R001 Bradycardia, unspecified: Secondary | ICD-10-CM | POA: Insufficient documentation

## 2012-01-22 DIAGNOSIS — IMO0002 Reserved for concepts with insufficient information to code with codable children: Secondary | ICD-10-CM | POA: Insufficient documentation

## 2012-01-22 DIAGNOSIS — I498 Other specified cardiac arrhythmias: Secondary | ICD-10-CM

## 2012-01-22 DIAGNOSIS — Z86718 Personal history of other venous thrombosis and embolism: Secondary | ICD-10-CM

## 2012-01-22 DIAGNOSIS — T148XXA Other injury of unspecified body region, initial encounter: Secondary | ICD-10-CM

## 2012-01-22 NOTE — Assessment & Plan Note (Signed)
This is a new problem and associated with hypotension.  Patient is not lightheaded at this time but does report sometimes with orthostatic positional changes. We will decrease bisoprolol from 10 bid to 5 bid. We will follow-up in 1 week. He was advised to follow-up sooner if chest pain or palpitations, worsening lightheadedness, or other concerning symptoms.

## 2012-01-22 NOTE — Assessment & Plan Note (Signed)
Of right shin following mechanical injury. Unna boot applied today.  He was advised to continue taking Tylenol #3 qd prn and/or Tylenol as needed for pain.  An appointment was made for him for next week and VM left notifying him of appointment. He was asked to follow-up sooner if concerning signs of infection or if that appointment time does not work for him.

## 2012-01-22 NOTE — Assessment & Plan Note (Signed)
Supratherapeutic INR today. Cause unclear since he had been well controlled on current dose.  We have asked him to hold next 2 days and resume at 5 mg daily (he was taking 5 mg daily except 2 days when he took 7.5 mg). We will follow-up in 1 week. His skin tears have fairly good hemostasis.

## 2012-01-22 NOTE — Progress Notes (Signed)
  Subjective:    Patient ID: [name], male    DOB: 07/01/1940, 71 y.o.   MRN: [id number]  HPI # Work-in: he scraped his leg a week ago and the wounds are bothering him He kept running into his bed and hitting his leg. Several days ago, his bed was turned to a different position and he has not hit it since.  The wounds hurt him sometimes and he takes Tylenol #3 intermittently for it (never more than one a day and sometimes with a regular Tylenol) Exacerbated by: moving the leg   Review of Systems Denies fevers/chills/nausea Denies changes in appetite Denies chest pain/palpitations/difficulty breathing Endorses dizziness/lightheadedness lasting half a minute sometimes when he stands up  Endorses dry mouth for 1 day. Denies difficult swallowing or decreased fluid intake.   Allergies, medication, past medical history reviewed.  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

## 2012-01-22 NOTE — Patient Instructions (Addendum)
CHANGE bisoprolol/Zebeta: instead of 1 tablet twice a day, TAKE 1/2 TABLET TWICE A DAY  Follow-up next Friday  If you get more lightheaded or have fevers or chest pain or other concerning symptoms, please make an appointment to be seen sooner

## 2012-01-26 ENCOUNTER — Telehealth: Payer: Self-pay | Admitting: Clinical

## 2012-01-26 NOTE — Telephone Encounter (Signed)
Clinical Child psychotherapist (CSW) tried to return pt call however CSW unable to reach pt. CSW contacted pt mobile number but wife stated pt was at home. CSW will await a call back.  Theresia Bough, MSW, [facility]res[facility]ors [phone]

## 2012-01-30 ENCOUNTER — Ambulatory Visit (INDEPENDENT_AMBULATORY_CARE_PROVIDER_SITE_OTHER): Payer: Medicare Other | Admitting: Family Medicine

## 2012-01-30 ENCOUNTER — Ambulatory Visit (INDEPENDENT_AMBULATORY_CARE_PROVIDER_SITE_OTHER): Payer: Medicare Other | Admitting: *Deleted

## 2012-01-30 ENCOUNTER — Encounter: Payer: Self-pay | Admitting: Family Medicine

## 2012-01-30 VITALS — BP 110/52 | HR 52 | Temp 98.4°F | Ht 68.0 in | Wt 289.0 lb

## 2012-01-30 DIAGNOSIS — F329 Major depressive disorder, single episode, unspecified: Secondary | ICD-10-CM

## 2012-01-30 DIAGNOSIS — Z86718 Personal history of other venous thrombosis and embolism: Secondary | ICD-10-CM

## 2012-01-30 DIAGNOSIS — Z7901 Long term (current) use of anticoagulants: Secondary | ICD-10-CM

## 2012-01-30 DIAGNOSIS — N400 Enlarged prostate without lower urinary tract symptoms: Secondary | ICD-10-CM

## 2012-01-30 DIAGNOSIS — T148XXA Other injury of unspecified body region, initial encounter: Secondary | ICD-10-CM

## 2012-01-30 DIAGNOSIS — IMO0002 Reserved for concepts with insufficient information to code with codable children: Secondary | ICD-10-CM

## 2012-01-30 DIAGNOSIS — F3289 Other specified depressive episodes: Secondary | ICD-10-CM

## 2012-01-30 MED ORDER — MIRTAZAPINE 7.5 MG PO TABS: 7.5000 mg | ORAL_TABLET | Freq: Every day | ORAL | Status: DC

## 2012-01-30 MED ORDER — QUETIAPINE FUMARATE 25 MG PO TABS: 25.0000 mg | ORAL_TABLET | Freq: Every day | ORAL | Status: DC

## 2012-01-30 NOTE — Patient Instructions (Addendum)
Effexor: take 1/2 tablet for 2 days then stop  Start queritapine (Seroquel) to help you sleep and for your nerves  Follow-up in 1 week

## 2012-02-01 NOTE — Progress Notes (Signed)
  Subjective:    Patient ID: [name], male    DOB: [birth year], 71 y.o.   MRN: [id number]  HPI # Follow-up of skin tears Unna boot started falling off yesterday so he removed His lesions are much improved  # He "needs something else for my nerves" His wife went to a nursing home 1 week ago and he has been having a difficult time coping He has been on Effexor for several years for depression. He does not think it ever helped him that much. Dr. Gwendolyn Grant his PCP was considering changing it.  He is having a difficult time sleeping. He often cannot fall asleep until 2-3 am.  ROS: denies SI/HI; endorses guilt, difficulty concentrating, low energy  Review of Systems Per HPI  Allergies, medication, past medical history reviewed.  He does not think any family history of mental disease He smoked years ago. He denies alcohol or other drugs.      Objective:   Physical Exam GEN/PSYCH: he appears very emotionally distressed and cried when speaking about his wife; not anxious-appearing; appropriate to questions; good eye contact SKIN: skin tears on right leg are healing very well and a couple of them have almost fully resolved; 1+ edema bilaterally NEURO: uses cane    Assessment & Plan:

## 2012-02-01 NOTE — Assessment & Plan Note (Signed)
INR therapeautic after changes made last week to warfarin. Will recheck in 1 week during follow-up.

## 2012-02-01 NOTE — Assessment & Plan Note (Signed)
Healing nicely following Unna boot. Recommending antibiotic ointment and keeping clean and covered.

## 2012-02-01 NOTE — Assessment & Plan Note (Signed)
His depression has worsened, compounded with adjustment difficulties since wife moved to nursing home 1 week ago.  He is living with his stepdaughter with whom he does not get along that well it seems, although he did not elaborate much on this.   For now, we will stop Effexor and change to Seroquel to see if it will help with sleep. He will follow-up in 1 week. I see that PCP mentioned potential home health aide. I am not sure if he has one. We will discuss more about this at that time and see if he may be interested in going to nursing home near wife as well because he had expressed interest in this in the past.

## 2012-02-04 ENCOUNTER — Encounter: Payer: Self-pay | Admitting: Home Health Services

## 2012-02-06 ENCOUNTER — Ambulatory Visit (INDEPENDENT_AMBULATORY_CARE_PROVIDER_SITE_OTHER): Payer: Medicare Other | Admitting: Family Medicine

## 2012-02-06 ENCOUNTER — Ambulatory Visit (INDEPENDENT_AMBULATORY_CARE_PROVIDER_SITE_OTHER): Payer: Medicare Other | Admitting: *Deleted

## 2012-02-06 VITALS — BP 88/56 | HR 78 | Temp 99.2°F

## 2012-02-06 DIAGNOSIS — I10 Essential (primary) hypertension: Secondary | ICD-10-CM

## 2012-02-06 DIAGNOSIS — F329 Major depressive disorder, single episode, unspecified: Secondary | ICD-10-CM

## 2012-02-06 DIAGNOSIS — Z86718 Personal history of other venous thrombosis and embolism: Secondary | ICD-10-CM

## 2012-02-06 DIAGNOSIS — Z7901 Long term (current) use of anticoagulants: Secondary | ICD-10-CM

## 2012-02-06 MED ORDER — BISOPROLOL FUMARATE 5 MG PO TABS: 5.0000 mg | ORAL_TABLET | Freq: Two times a day (BID) | ORAL | Status: DC

## 2012-02-06 MED ORDER — QUETIAPINE FUMARATE 50 MG PO TABS: 50.0000 mg | ORAL_TABLET | Freq: Every day | ORAL | Status: DC

## 2012-02-06 NOTE — Patient Instructions (Addendum)
For sleep: INCREASE Seroquel to 50 mg a day (2 tablets of your current medicine)  For low blood pressure: DECREASE Bisoprolol to 5 mg twice a day  Follow-up in 2 weeks

## 2012-02-07 NOTE — Assessment & Plan Note (Addendum)
He has had low blood pressures in clinic past couple of visits. He is asymptomatic for this. We will decrease beta-blocker further to 5 mg bid. Continue isosorbide 30, lisinopril 10, torsemide 20 (he also has a history of significant systolic heart failure, angina, CAD, diabetes).

## 2012-02-07 NOTE — Assessment & Plan Note (Signed)
Depression and insomnia improved with Seroquel 25 mg, which is a very low dose. We will increase to 50 mg since it is helping and he sometimes wakes up during the night.

## 2012-02-07 NOTE — Progress Notes (Signed)
  Subjective:    Patient ID: [name], male    DOB: [birth year], 71 y.o.   MRN: [id number]  HPI # Depression, difficulty sleeping Improved on Seroquel 25 mg at night. Easier for him to fall asleep but he sometimes wakes up a couple of times during the night ROS: denies agitation, daytime somnolence  # History of hypertension with recent hypotension recorded during office visit He decreased bisoprolol and denies lightheadedness/falls/dizziness He is compliant with his other anti-hypertensives ROS: denies palpitations, difficulty breathing  # High risk social situation  His wife is at nursing home short term due to rehabilitation for knee surgery He lives with his stepdaughter who is a "crackhead"   # Patient brought his medications and reconciled medications  Review of Systems Per HPI Denies chest pain  Allergies, medication, past medical history reviewed.      Objective:   Physical Exam GEN: NAD; obese; elderly PSYCH: alert; appropriate to questions; pleasant; not tearful; mildly depressed appearing; not agitated CV: RRR, normal S1/S2, no murmurs PULM: NI WOB; CTAB EXT: 0-1+ pretibial edema bilaterally; he has chronic tenderness along shins    Assessment & Plan:

## 2012-02-12 ENCOUNTER — Encounter: Payer: Self-pay | Admitting: Cardiology

## 2012-02-12 ENCOUNTER — Ambulatory Visit (INDEPENDENT_AMBULATORY_CARE_PROVIDER_SITE_OTHER): Payer: Medicare Other | Admitting: Cardiology

## 2012-02-12 ENCOUNTER — Ambulatory Visit (INDEPENDENT_AMBULATORY_CARE_PROVIDER_SITE_OTHER): Payer: Medicare Other | Admitting: Family Medicine

## 2012-02-12 ENCOUNTER — Encounter: Payer: Self-pay | Admitting: Family Medicine

## 2012-02-12 VITALS — BP 100/57 | HR 70 | Temp 98.1°F | Wt 301.9 lb

## 2012-02-12 VITALS — BP 130/62 | HR 76 | Resp 21 | Ht 68.0 in | Wt 281.8 lb

## 2012-02-12 DIAGNOSIS — J449 Chronic obstructive pulmonary disease, unspecified: Secondary | ICD-10-CM

## 2012-02-12 DIAGNOSIS — E7800 Pure hypercholesterolemia, unspecified: Secondary | ICD-10-CM

## 2012-02-12 DIAGNOSIS — I5022 Chronic systolic (congestive) heart failure: Secondary | ICD-10-CM

## 2012-02-12 DIAGNOSIS — I209 Angina pectoris, unspecified: Secondary | ICD-10-CM

## 2012-02-12 DIAGNOSIS — R600 Localized edema: Secondary | ICD-10-CM

## 2012-02-12 DIAGNOSIS — I259 Chronic ischemic heart disease, unspecified: Secondary | ICD-10-CM

## 2012-02-12 DIAGNOSIS — R609 Edema, unspecified: Secondary | ICD-10-CM

## 2012-02-12 DIAGNOSIS — M25561 Pain in right knee: Secondary | ICD-10-CM

## 2012-02-12 DIAGNOSIS — M25569 Pain in unspecified knee: Secondary | ICD-10-CM

## 2012-02-12 MED ORDER — ALBUTEROL SULFATE (2.5 MG/3ML) 0.083% IN NEBU: 2.5000 mg | INHALATION_SOLUTION | Freq: Four times a day (QID) | RESPIRATORY_TRACT | Status: DC | PRN

## 2012-02-12 MED ORDER — ALBUTEROL SULFATE HFA 108 (90 BASE) MCG/ACT IN AERS: 2.0000 | INHALATION_SPRAY | RESPIRATORY_TRACT | Status: DC | PRN

## 2012-02-12 MED ORDER — HYDROCODONE-ACETAMINOPHEN 5-325 MG PO TABS: 1.0000 | ORAL_TABLET | Freq: Four times a day (QID) | ORAL | Status: DC | PRN

## 2012-02-12 NOTE — Assessment & Plan Note (Signed)
The patient has chronic dyspnea.  He's had chronic lower extremity edema.  His weight is actually down 16 pounds since last visit 4 months ago.  He is trying to avoid dietary salt.

## 2012-02-12 NOTE — Assessment & Plan Note (Signed)
The patient has not been experiencing any recent severe chest pain.  He has not had to take any recent sublingual nitroglycerin.Marland Kitchen

## 2012-02-12 NOTE — Patient Instructions (Signed)
Take 2 fluid pills in AM and 1 pill in PM.    Take the Norco for pain relief.  Cut the Bisoprolol in half.    Come back and see me MOnday or Tuesday.

## 2012-02-12 NOTE — Assessment & Plan Note (Signed)
The patient has severe COPD.  He states that he is presently out of supplies for his inhalers at home.

## 2012-02-12 NOTE — Progress Notes (Signed)
[name] Date of Birth:  1940-07-21 [facility] [address] [place], [place]  [address] [phone]  Fax   [phone]  HPI: This pleasant 71 year old gentleman is seen for a scheduled four-month followup office visit. He has a complex past medical history. 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 He has been on long-term Coumadin because of angina decubitus. In addition the patient has exogenous obesity, hypercholesterolemia, diabetes, osteoarthritis, and venous stasis dermatitis. He has adult onset diabetes mellitus. He has COPD. he no longer smokes.  Recently the patient has been having a lot of problems with cellulitis and blistering ulcers of his lower extremities.  This [facility].  The home health nurse had been coming out but is no longer coming out to his home.   Current Outpatient Prescriptions  Medication Sig Dispense Refill  . acetaminophen-codeine (TYLENOL #3) 300-30 MG per tablet Take 1 tablet by mouth daily as needed.      Marland Kitchen acetaminophen-codeine (TYLENOL #4) 300-60 MG per tablet Take 1 tablet by mouth every 4 (four) hours as needed for pain.  45 tablet  1  . albuterol (PROVENTIL HFA;VENTOLIN HFA) 108 (90 BASE) MCG/ACT inhaler Inhale 2 puffs into the lungs every 4 (four) hours as needed. For shortness of breath      . albuterol (PROVENTIL) (2.5 MG/3ML) 0.083% nebulizer solution Take 2.5 mg by nebulization every 6 (six) hours as needed. For shortness of breath      . aspirin (BABY ASPIRIN) 81 MG chewable tablet Chew 81 mg by mouth daily.       . bisoprolol (ZEBETA) 5 MG tablet Take 1 tablet (5 mg total) by mouth 2 (two) times daily.  60 tablet  11  . Fluticasone-Salmeterol (ADVAIR) 100-50 MCG/DOSE AEPB Inhale 1 puff into the lungs 2 (two) times daily.      . insulin NPH (HUMULIN N,NOVOLIN N) 100 UNIT/ML injection Inject 70 Units  into the skin 2 (two) times daily.      . isosorbide mononitrate (IMDUR) 30 MG 24 hr tablet Take 1 tablet (30 mg total) by mouth daily.  30 tablet  11  . lisinopril (PRINIVIL,ZESTRIL) 10 MG tablet Take 1 tablet (10 mg total) by mouth daily.  90 tablet  2  . nitroGLYCERIN (NITROSTAT) 0.4 MG SL tablet Place 0.4 mg under the tongue as needed. For chest pains      . omeprazole (PRILOSEC) 40 MG capsule Take 1 capsule (40 mg total) by mouth daily.  30 capsule  3  . QUEtiapine (SEROQUEL) 50 MG tablet Take 1 tablet (50 mg total) by mouth at bedtime.  30 tablet  0  . simvastatin (ZOCOR) 80 MG tablet Take 80 mg by mouth at bedtime.      . Tamsulosin HCl (FLOMAX) 0.4 MG CAPS Take 0.4 mg by mouth daily after breakfast.      . torsemide (DEMADEX) 20 MG tablet Take 1 tablet (20 mg total) by mouth daily.  60 tablet  3  . warfarin (COUMADIN) 5 MG tablet Take 5-7.5 mg by mouth daily. Take 5mg  every day, except take 7.5mg  on Monday and Friday         Allergies  Allergen Reactions  . Diazepam Anxiety    REACTION: makes patient cry    Patient Active Problem List  Diagnosis  . DIABETIC PERIPHERAL NEUROPATHY  . HYPERCHOLESTEROLEMIA  . ANXIETY  . DEPRESSION  . HYPERTENSION  .  CORONARY, ARTERIOSCLEROSIS  . CHF - EJECTION FRACTION < 50%  . REFLUX ESOPHAGITIS  . FROZEN RIGHT SHOULDER  . APNEA, SLEEP  . Long term (current) use of anticoagulants  . BPH (benign prostatic hyperplasia)  . Stasis dermatitis  . COPD (chronic obstructive pulmonary disease)  . Osteomyelitis  . Vancomycin adverse reaction  . History of osteomyelitis  . Peripheral vascular disease, unspecified  . Diabetes mellitus out of control  . Toe amputation status  . Fatigue  . Swelling of left lower extremity  . Leg edema  . Knee pain, bilateral  . GERD (gastroesophageal reflux disease)  . Angina pectoris  . Hemorrhoid  . Bradycardia    History  Smoking status  . Former Smoker  . Quit date: 06/19/2000  Smokeless tobacco  .  Never Used    History  Alcohol Use No    Family History  Problem Relation Age of Onset  . Heart disease Father     Review of Systems: The patient denies any heat or cold intolerance.  No weight gain or weight loss.  The patient denies headaches or blurry vision.  There is no cough or sputum production.  The patient denies dizziness.  There is no hematuria or hematochezia.  The patient denies any muscle aches or arthritis.  The patient denies any rash.  The patient denies frequent falling or instability.  There is no history of depression or anxiety.  All other systems were reviewed and are negative.   Physical Exam: Filed Vitals:   02/12/12 1404  BP: 130/62  Pulse: 76  Resp: 21   the general appearance reveals a large gentleman in no acute distress.The head and neck exam reveals pupils equal and reactive.  Extraocular movements are full.  There is no scleral icterus.  The mouth and pharynx are normal.  The neck is supple.  The carotids reveal no bruits.  The jugular venous pressure is normal.  The  thyroid is not enlarged.  There is no lymphadenopathy.  The chest reveals mild expiratory wheezes and rhonchi Expansion of the chest is symmetrical.  The precordium is quiet.  The first heart sound is normal.  The second heart sound is physiologically split.  There is no murmur gallop rub or click.  There is no abnormal lift or heave.  The abdomen is soft and nontender.  The bowel sounds are normal.  The liver and spleen are not enlarged.  There are no abdominal masses.  There are no abdominal bruits.  Extremities reveal 3+ bilateral pretibial and pedal edema.  He is wearing support O.'s.  Multiple ulcers are visible which are presently covered with dry sterile dressings on his lower extremities.  There is no cyanosis or clubbing.  Strength is normal and symmetrical in all extremities.  There is no lateralizing weakness.  There are no sensory deficits.  The skin is warm and dry.  There is no  rash.     Assessment / Plan: The patient is to continue same medication.  He will continue to avoid salt and work harder on further weight loss through dietary measures.  Recheck in 4 months for office visit

## 2012-02-12 NOTE — Patient Instructions (Addendum)
Your physician recommends that you continue on your current medications as directed. Please refer to the Current Medication list given to you today.  Your physician recommends that you schedule a follow-up appointment in: 4 months  

## 2012-02-13 NOTE — Progress Notes (Signed)
  Subjective:    Patient ID: [name], male    DOB: [birth year], 71 y.o.   MRN: [id number]  HPI  1.  BL knee pain:  Worsening since increasing of edema, see below.  Has been taking Codeine with inconsistent relief.  Generalized pain in both knees, not worse either knee, sometimes when Codeine doesn't relieve pain he has to lie in bed and go to sleep to feel better.  No trauma, no recent falls.   2.  LE edema:  Has been increasing for past 1-2 weeks.  Has increased Torsemide to BID dosing past 3 days on his own.  Feels this helps somewhat but not great.  Wife has been in SNF s/p knee replacement.  Has been having to fend for himself for past 1.5 weeks.  No increasing dyspnea.  No cough.    Review of Systems See HPI above for review of systems.       Objective:   Physical Exam  Gen:  Alert, cooperative patient who appears stated age in no acute distress.  Vital signs reviewed. HEENT: MMM Neck: No JVD Lungs:  No crackles.  Some wheezing at bases Heart: RRR Ext:  +3 edema BL to knees.   Knees:  Tender along joint lines and at tibial tuberosity BL.         Assessment & Plan:

## 2012-02-13 NOTE — Assessment & Plan Note (Signed)
No evidence of exacerbation today. He himself has cut Bisoprolol back to 1/2 pill BID.  Will update med list.  He has history of hypotension and bradycardia, as well as orthostatic hypotension resulting in falls.   Holding Imdur today -- if chest pain returns he can restart this.   Patient expressed understanding.   Weights are GREATLY off -- he left cardiologist earlier today and there is 20 lb discrepancy noted.

## 2012-02-13 NOTE — Assessment & Plan Note (Addendum)
Short term course of Norco for relief, then back to usual codeine.   Tender along joint lines BL. Patient declines x-rays.   He will come back early next week, I think if we can get him diuresed, it will go a long way to helping his knee pain.   Feel that depression likely plays into this as well -- cymbalta might be a good option/switch for him.  Will discuss next visit.

## 2012-02-13 NOTE — Assessment & Plan Note (Signed)
Increased Torsemide to 40 mg in AM and 20 mg in PM.  FU early next week.   No evidence of ulcers on legs Pressed him to start wearing compression stockings

## 2012-02-17 ENCOUNTER — Ambulatory Visit (INDEPENDENT_AMBULATORY_CARE_PROVIDER_SITE_OTHER): Payer: Medicare Other | Admitting: Family Medicine

## 2012-02-17 ENCOUNTER — Encounter: Payer: Self-pay | Admitting: Family Medicine

## 2012-02-17 VITALS — BP 127/94 | HR 130 | Temp 97.9°F | Ht 68.0 in | Wt 294.7 lb

## 2012-02-17 DIAGNOSIS — M7989 Other specified soft tissue disorders: Secondary | ICD-10-CM

## 2012-02-17 DIAGNOSIS — E119 Type 2 diabetes mellitus without complications: Secondary | ICD-10-CM

## 2012-02-17 DIAGNOSIS — W19XXXA Unspecified fall, initial encounter: Secondary | ICD-10-CM

## 2012-02-17 DIAGNOSIS — R600 Localized edema: Secondary | ICD-10-CM

## 2012-02-17 DIAGNOSIS — R609 Edema, unspecified: Secondary | ICD-10-CM

## 2012-02-17 LAB — CBC
HCT: 41.4 % (ref 39.0–52.0)
Hemoglobin: 13.9 g/dL (ref 13.0–17.0)
MCH: 26.1 pg (ref 26.0–34.0)
MCHC: 33.6 g/dL (ref 30.0–36.0)
MCV: 77.8 fL — ABNORMAL LOW (ref 78.0–100.0)

## 2012-02-17 LAB — COMPREHENSIVE METABOLIC PANEL
AST: 21 U/L (ref 0–37)
Alkaline Phosphatase: 65 U/L (ref 39–117)
BUN: 17 mg/dL (ref 6–23)
Creat: 1.19 mg/dL (ref 0.50–1.35)
Sodium: 145 mEq/L (ref 135–145)

## 2012-02-17 MED ORDER — ZOSTER VACCINE LIVE 19400 UNT/0.65ML ~~LOC~~ SOLR: 0.6500 mL | Freq: Once | SUBCUTANEOUS | Status: DC

## 2012-02-17 NOTE — Assessment & Plan Note (Addendum)
INR therapeutic. Unna boots placed today for compression bilaterally.  FU in 1 week. Continue with Torsemide.

## 2012-02-17 NOTE — Progress Notes (Signed)
  Subjective:    Patient ID: [name], male    DOB: 02/28/1941, 71 y.o.   MRN: [id number]  HPI  1.  Lower extremity edema and pain: Persistent from last week.  Bilateral.  Is taking 3-4 pills of Torsemide on daily basis.  Still with weeping from bilateral legs.  Intermittent compression stocking use at home.  No redness in legs.  Hydrocodone has helped with pain in knees.  No increased dyspnea.   2.  Falls:  Patient with increased number of falls.  Larey Seat several days ago in back yard, tripped in hole.  Had to have someone else help him up.  Also with 2 falls that have occurred in past several days during the morning.  Occurs when he stands, feels lightheaded and has to grab something to steady himself or he will fall.  Has stopped his Imdur as we discussed.     Review of Systems See HPI above for review of systems.       Objective:   Physical Exam Gen:  Alert, cooperative patient who appears stated age in no acute distress.  Vital signs reviewed. Heart:  RRR Lungs:  Poor inspiratory effort.  No increased wheezing from baseline.  Abdomen:  Obese, no pitting edema Legs:  +3 edema to knees.  Chronic venous stasis changes noted.  Weeping noted bilaterally.         Assessment & Plan:

## 2012-02-17 NOTE — Patient Instructions (Signed)
Make an appointment to see Dr. Sheffield Slider in Geriatrics clinic for next week if possible.  Come back and see me the week after that.  Tell the ladies up front that if it will be a couple of weeks before you can be seen in Geriatrics clinic, then I will see you next week.    Keep taking the Torsemide fluid pill at the increased dose.  I would stay off the Imdur unless you start having chest pain.

## 2012-02-17 NOTE — Assessment & Plan Note (Signed)
Believe secondary to hypotension.  BP much better today than usual since stepping Imdur. Discussed slowly standing, which he states he usually does anyway. Referral to Geriatrics clinic for next week to evaluate gait and any recommendations regarding decreasing his falls.

## 2012-02-23 ENCOUNTER — Ambulatory Visit (INDEPENDENT_AMBULATORY_CARE_PROVIDER_SITE_OTHER): Payer: Medicare Other | Admitting: Family Medicine

## 2012-02-23 ENCOUNTER — Ambulatory Visit (INDEPENDENT_AMBULATORY_CARE_PROVIDER_SITE_OTHER): Payer: Medicare Other | Admitting: *Deleted

## 2012-02-23 ENCOUNTER — Encounter: Payer: Self-pay | Admitting: Family Medicine

## 2012-02-23 VITALS — BP 116/51 | HR 72 | Wt 298.0 lb

## 2012-02-23 DIAGNOSIS — R600 Localized edema: Secondary | ICD-10-CM

## 2012-02-23 DIAGNOSIS — I739 Peripheral vascular disease, unspecified: Secondary | ICD-10-CM

## 2012-02-23 DIAGNOSIS — Z86718 Personal history of other venous thrombosis and embolism: Secondary | ICD-10-CM

## 2012-02-23 DIAGNOSIS — Z7901 Long term (current) use of anticoagulants: Secondary | ICD-10-CM

## 2012-02-23 DIAGNOSIS — R609 Edema, unspecified: Secondary | ICD-10-CM

## 2012-02-23 DIAGNOSIS — M25562 Pain in left knee: Secondary | ICD-10-CM

## 2012-02-23 DIAGNOSIS — M25569 Pain in unspecified knee: Secondary | ICD-10-CM

## 2012-02-23 DIAGNOSIS — M25561 Pain in right knee: Secondary | ICD-10-CM

## 2012-02-23 NOTE — Assessment & Plan Note (Signed)
Holding INR x 2 days then resuming back at 5 mg.  He will follow-up next week He has history of difficulty with Coumadin with multiple episodes of supra-therapeutic and sub-therapeutic INRs.

## 2012-02-23 NOTE — Assessment & Plan Note (Signed)
Improved, no med changes today

## 2012-02-23 NOTE — Progress Notes (Signed)
  Subjective:    Patient ID: [name], male    DOB: 13-Feb-1941, 71 y.o.   MRN: [id number]  HPI  1.  LE edema:  Improved somewhat since placement of Unna boot.  He now has open sore on inside of his Right calf, which was present when Unna boot was removed.  Is still taking TID dosing of 20 mg Torsemide.  No dyspnea or cough.    2.  INR follow-up:  INR is a little high today, 3.8.  No changes in diet, no eating any more dark leafy greens.  Taking 5 mg daily, has been steady on this dose for past month.    Review of Systems See HPI above for review of systems.       Objective:   Physical Exam Gen:  Alert, cooperative patient who appears stated age in no acute distress.  Vital signs reviewed. Cardiac:  Regular rate and rhythm without murmur auscultated.  Good S1/S2. Pulm:  Clear to auscultation bilaterally with good air movement.  No wheezes or rales noted.   Ext:  +2 edema to mid-shins.  Chronic venous stasis changes noted.  0.5 cm in diameter open wound on medial aspect of Right calf.  No purulent drainage noted, minimal bleeding noted.  Nontender.  Left Ray amputation noted.   Skin:  No bruising or bleeding noted throughout.         Assessment & Plan:

## 2012-02-23 NOTE — Patient Instructions (Addendum)
Take the Torsemide twice a day.  Come back next week so we can check on your wound.    Follow-up in Geriatrics clinic.

## 2012-02-23 NOTE — Assessment & Plan Note (Signed)
Contributes to wound formation, with diabetes also contributing. Wound care here in clinic today with bandage applied.   To FU next week for re-assessment.

## 2012-02-23 NOTE — Assessment & Plan Note (Signed)
Much improved s/p Unna boots. Decreasing Torsemide to BID dosing.  I will be surprised if we back down much more than that in future as he has persistent edema. Fu next week for recheck.

## 2012-02-24 ENCOUNTER — Ambulatory Visit: Payer: Medicare Other

## 2012-02-25 ENCOUNTER — Ambulatory Visit: Payer: Medicare Other | Admitting: Family Medicine

## 2012-03-01 ENCOUNTER — Ambulatory Visit (INDEPENDENT_AMBULATORY_CARE_PROVIDER_SITE_OTHER): Payer: Medicare Other | Admitting: Family Medicine

## 2012-03-01 ENCOUNTER — Encounter: Payer: Self-pay | Admitting: Family Medicine

## 2012-03-01 VITALS — BP 100/61 | HR 60 | Ht 68.0 in | Wt 295.0 lb

## 2012-03-01 DIAGNOSIS — E1165 Type 2 diabetes mellitus with hyperglycemia: Secondary | ICD-10-CM

## 2012-03-01 DIAGNOSIS — E559 Vitamin D deficiency, unspecified: Secondary | ICD-10-CM

## 2012-03-01 DIAGNOSIS — W19XXXA Unspecified fall, initial encounter: Secondary | ICD-10-CM

## 2012-03-01 DIAGNOSIS — E119 Type 2 diabetes mellitus without complications: Secondary | ICD-10-CM

## 2012-03-01 DIAGNOSIS — R609 Edema, unspecified: Secondary | ICD-10-CM

## 2012-03-01 DIAGNOSIS — K5900 Constipation, unspecified: Secondary | ICD-10-CM | POA: Insufficient documentation

## 2012-03-01 DIAGNOSIS — R600 Localized edema: Secondary | ICD-10-CM

## 2012-03-01 NOTE — Patient Instructions (Signed)
You're doing much better on your diabetes.  Keep going at the same medicine dose.  I think you should continue the fluid pill at the same dose.  Start taking a stool softener twice a day.   Take the Miralax to help clean out the constipation. You will need to do this anyway before you see Dr. Loreta Ave.   Call Dr. Loreta Ave for a repeat colonoscopy.  Let me know if you need a referral.    It was good to see you today.  Have a good Christmas.

## 2012-03-01 NOTE — Assessment & Plan Note (Addendum)
Much improved. No changes to medications today. <8 is his goal.

## 2012-03-01 NOTE — Assessment & Plan Note (Addendum)
None for past week. Likely secondary to combination of orthostatic hypotension and gait instability. Referall to [facility] clinic today as he has had >3 falls in past month.

## 2012-03-01 NOTE — Progress Notes (Deleted)
  Subjective:    Patient ID: [name], male    DOB: [birth year], 71 y.o.   MRN: [id number]  HPI    Review of Systems     Objective:   Physical Exam        Assessment & Plan:

## 2012-03-01 NOTE — Assessment & Plan Note (Signed)
Likely secondary to opiate use and not being on stool softener Started softener today.  Miralax as well.   On related note, last colonoscopy was in 2006 with FU in 5 years -- he has not followed up.  Encouraged to call Dr. Kenna Gilbert office to set up colonoscopy.

## 2012-03-01 NOTE — Assessment & Plan Note (Signed)
Improving. Encouraged to continue leg compression stockings at home.

## 2012-03-01 NOTE — Progress Notes (Signed)
Patient ID: [name], male   DOB: 1940-05-31, 71 y.o.   MRN: [id number] [name] is a 71 y.o. male who [facility] to [facility] today for follow-up for leg swelling:  1.  Leg edema:  Continues to improve.  Wounds on legs have healed.  He is taking Torsemide BID.  Weights have been stable 295.  No dyspnea.  No cough.  NO fevers or chills.     2.   Diabetes:  Currently on Novolin.  Reports being much more compliant than previously with his medications.   No adverse effects from medication.  No hypoglycemic events.  No paresthesia or peripheral nerve pain.  Measures blood sugars at home every:   day  Lab Results  Component Value Date   HGBA1C 7.7 03/01/2012   3.  Diarrhea:  Present for past 3-4 weeks.  Has once every 4-5 days.  Recently has started to worsen.  Describes hard, small ball stools like ball bearings in between diarrhea events.  No abdominal pain.  No nausea or vomiting.     The following portions of the patient's history were reviewed and updated as appropriate: allergies, current medications, past medical history, family and social history, and problem list.  Patient is a nonsmoker.  Past Medical History  Diagnosis Date  . Depression   . Diabetes mellitus   . COPD (chronic obstructive pulmonary disease)   . Adhesive capsulitis   . MRSA bacteremia     2011 - possible endocarditis, received 6 weeks IV treatment  . Hyperlipidemia   . Anxiety   . GERD (gastroesophageal reflux disease)   . CAD (coronary artery disease)   . 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 CVA (cerebral infarction) Questionable history  . Diverticulosis   . Osteomyelitis 2012    Sternoclavicular joint   . Chronic back pain   . Angina   . Myocardial infarction   . CHF (congestive heart failure)   . Shortness of breath   . Sleep apnea   . Chronic kidney disease     hx of BPH  . Neuromuscular disorder     HX of diabetic periferal neuropathy  . Hypertension   . Peripheral  vascular disease     ROS as above otherwise neg. No Chest pain, palpitations, SOB, Fever, Chills, Abd pain, N/V/D.  Medications reviewed. Current Outpatient Prescriptions  Medication Sig Dispense Refill  . albuterol (PROVENTIL HFA;VENTOLIN HFA) 108 (90 BASE) MCG/ACT inhaler Inhale 2 puffs into the lungs every 4 (four) hours as needed. For shortness of breath  1 Inhaler  3  . albuterol (PROVENTIL) (2.5 MG/3ML) 0.083% nebulizer solution Take 3 mLs (2.5 mg total) by nebulization every 6 (six) hours as needed. For shortness of breath  75 mL  3  . aspirin (BABY ASPIRIN) 81 MG chewable tablet Chew 81 mg by mouth daily.       . bisoprolol (ZEBETA) 5 MG tablet Take 2.5 mg by mouth 2 (two) times daily.      . Fluticasone-Salmeterol (ADVAIR) 100-50 MCG/DOSE AEPB Inhale 1 puff into the lungs 2 (two) times daily.      Marland Kitchen HYDROcodone-acetaminophen (NORCO) 5-325 MG per tablet Take 1 tablet by mouth every 6 (six) hours as needed for pain.  30 tablet  0  . [DISCONTINUED] bisoprolol (ZEBETA) 5 MG tablet Take 1 tablet (5 mg total) by mouth 2 (two) times daily.  60 tablet  11  . acetaminophen-codeine (TYLENOL #4) 300-60 MG per tablet  Take 1 tablet by mouth every 4 (four) hours as needed for pain.  45 tablet  1  . insulin NPH (HUMULIN N,NOVOLIN N) 100 UNIT/ML injection Inject 70 Units into the skin 2 (two) times daily.      Marland Kitchen lisinopril (PRINIVIL,ZESTRIL) 10 MG tablet Take 1 tablet (10 mg total) by mouth daily.  90 tablet  2  . nitroGLYCERIN (NITROSTAT) 0.4 MG SL tablet Place 0.4 mg under the tongue as needed. For chest pains      . omeprazole (PRILOSEC) 40 MG capsule Take 1 capsule (40 mg total) by mouth daily.  30 capsule  3  . QUEtiapine (SEROQUEL) 50 MG tablet Take 1 tablet (50 mg total) by mouth at bedtime.  30 tablet  0  . simvastatin (ZOCOR) 80 MG tablet Take 80 mg by mouth at bedtime.      . Tamsulosin HCl (FLOMAX) 0.4 MG CAPS Take 0.4 mg by mouth daily after breakfast.      . torsemide (DEMADEX) 20 MG  tablet Take 1 tablet (20 mg total) by mouth daily.  60 tablet  3  . venlafaxine XR (EFFEXOR-XR) 37.5 MG 24 hr capsule       . warfarin (COUMADIN) 5 MG tablet Take 5-7.5 mg by mouth daily. Take 5mg  every day, except take 7.5mg  on Monday and Friday       . zoster vaccine live, PF, (ZOSTAVAX) 16109 UNT/0.65ML injection Inject 19,400 Units into the skin once.  1 each  0    Exam:  BP 100/61  Pulse 60  Ht 5\' 8"  (1.727 m)  Wt 295 lb (133.811 kg)  BMI 44.85 kg/m2 Gen: Well NAD HEENT: EOMI,  MMM Lungs: CTABL Nl WOB Heart: RRR no MRG Abd: Obese, nontender.   Exts: +2 edema BL.  PRior wounds have healed.  No open ulcers or drainage.     Results for orders placed in visit on 03/01/12 (from the past 72 hour(s))  POCT GLYCOSYLATED HEMOGLOBIN (HGB A1C)     Status: Normal   Collection Time   03/01/12 10:25 AM      Component Value Range Comment   Hemoglobin A1C 7.7

## 2012-03-08 ENCOUNTER — Ambulatory Visit: Payer: Medicare Other

## 2012-03-09 ENCOUNTER — Ambulatory Visit (INDEPENDENT_AMBULATORY_CARE_PROVIDER_SITE_OTHER): Payer: Medicare Other | Admitting: *Deleted

## 2012-03-09 DIAGNOSIS — Z7901 Long term (current) use of anticoagulants: Secondary | ICD-10-CM

## 2012-03-09 DIAGNOSIS — Z86718 Personal history of other venous thrombosis and embolism: Secondary | ICD-10-CM

## 2012-03-11 ENCOUNTER — Ambulatory Visit (INDEPENDENT_AMBULATORY_CARE_PROVIDER_SITE_OTHER): Payer: Medicare Other | Admitting: Family Medicine

## 2012-03-11 VITALS — BP 126/68 | HR 72 | Temp 98.1°F | Ht 68.0 in | Wt 283.4 lb

## 2012-03-11 DIAGNOSIS — W19XXXA Unspecified fall, initial encounter: Secondary | ICD-10-CM

## 2012-03-11 DIAGNOSIS — R296 Repeated falls: Secondary | ICD-10-CM

## 2012-03-11 DIAGNOSIS — I5022 Chronic systolic (congestive) heart failure: Secondary | ICD-10-CM

## 2012-03-11 DIAGNOSIS — I2510 Atherosclerotic heart disease of native coronary artery without angina pectoris: Secondary | ICD-10-CM

## 2012-03-11 DIAGNOSIS — I10 Essential (primary) hypertension: Secondary | ICD-10-CM

## 2012-03-11 LAB — BASIC METABOLIC PANEL
BUN: 15 mg/dL (ref 6–23)
CO2: 23 mEq/L (ref 19–32)
Calcium: 9 mg/dL (ref 8.4–10.5)
Chloride: 101 mEq/L (ref 96–112)
Creat: 1.01 mg/dL (ref 0.50–1.35)
Glucose, Bld: 290 mg/dL — ABNORMAL HIGH (ref 70–99)
Potassium: 4.8 mEq/L (ref 3.5–5.3)
Sodium: 132 mEq/L — ABNORMAL LOW (ref 135–145)

## 2012-03-11 LAB — VITAMIN B12: Vitamin B-12: 283 pg/mL (ref 211–911)

## 2012-03-11 NOTE — Assessment & Plan Note (Addendum)
well controlled Weight down

## 2012-03-11 NOTE — Assessment & Plan Note (Addendum)
To keep walking to build strength, but look down to avoid tripping, and continue to use his cane  Will check Vitamin B12 and Vitamin D.

## 2012-03-11 NOTE — Assessment & Plan Note (Addendum)
well controlled but no ORTHOSTATIC HYPOTENSION Check CMET

## 2012-03-11 NOTE — Patient Instructions (Addendum)
Return to see Dr Gwendolyn Grant in one month.  I will let you know if there are problems on your lab tests.   Keep walking to build strength, but look down to avoid tripping

## 2012-03-11 NOTE — Progress Notes (Signed)
  Subjective:    Patient ID: [name], male    DOB: 08-01-1940, 71 y.o.   MRN: [id number]  HPI Falls - 3 times in past year, all related to tripping. He does feel lightheaded and head spinning at times, but no loss of consciousness. Stepped in a hole last summer and fell hurting his R lower ribs and right knee.  Diarrhea - 6 loose bowel movements 2 days ago and 2 yesterday. Has appointment with Dr Loreta Ave next month COPD - can't afford Advair but breathing OK recently  Review of Systems     Objective:   Physical Exam  Cardiovascular: Normal rate and regular rhythm.   No murmur heard. Pulmonary/Chest: Effort normal and breath sounds normal. No respiratory distress. He has no wheezes.  Musculoskeletal:       Decreased sensation to filament from ankles down.  Poor position sense.  Absent left great toe.   Neurological: He is alert. No cranial nerve deficit.       No nystagmus He couldn't get on the table for [place]Hal[place]ikes, but sitting version didn't elicit vertigo or nystagmus  Mild TD type facial movements  Psychiatric:       Strange affect with seeming exaggeration of some responses to balance testing, but consistent with behavior that I have seen in him in the past.    Alert, pleasant and cooperative Central obesity    Balance Abnormal Patient value  Sitting balance    Arise    Attempts to arise    Immediate standing balance    Standing balance x Wide based  Nudge    Eyes closed x   360 degree turn x discontinuous  Sitting down x Uses arms   Gait Abnormal Patient value  Initiation of gait    Step length-left x Short steps  Step length-right x   Step height-left x Low height  Step height-right x   Step symmetry    Step continuity    Path    Trunk x Leans trunk to the left while walking   Walking stance x Wide-based        I interviewed and examined this patient  and discussed the treatment plan with Dr Louanne Belton.   Assessment & Plan:

## 2012-03-12 DIAGNOSIS — E559 Vitamin D deficiency, unspecified: Secondary | ICD-10-CM | POA: Insufficient documentation

## 2012-03-12 LAB — VITAMIN D 25 HYDROXY (VIT D DEFICIENCY, FRACTURES): Vit D, 25-Hydroxy: 14 ng/mL — ABNORMAL LOW (ref 30–89)

## 2012-03-12 NOTE — Assessment & Plan Note (Signed)
He needs replacement with 16109 IU weekly for 8 weeks, but this OTC medicine isn't covered so he will have to buy it and medicine cost has been an issue

## 2012-03-22 ENCOUNTER — Other Ambulatory Visit: Payer: Self-pay | Admitting: Family Medicine

## 2012-03-22 ENCOUNTER — Telehealth: Payer: Self-pay | Admitting: Family Medicine

## 2012-03-22 ENCOUNTER — Ambulatory Visit: Payer: Medicare Other | Admitting: Cardiology

## 2012-03-22 NOTE — Telephone Encounter (Signed)
blood pressure on 12/14 118/90 lying 124/84 sitting blood pressure on 12/15 124/76 lying 131/81 sitting

## 2012-03-23 ENCOUNTER — Ambulatory Visit: Payer: Medicare Other | Admitting: Cardiology

## 2012-03-29 ENCOUNTER — Ambulatory Visit: Payer: Medicare Other

## 2012-03-31 ENCOUNTER — Ambulatory Visit (INDEPENDENT_AMBULATORY_CARE_PROVIDER_SITE_OTHER): Payer: Medicare Other | Admitting: *Deleted

## 2012-03-31 DIAGNOSIS — Z7901 Long term (current) use of anticoagulants: Secondary | ICD-10-CM

## 2012-03-31 DIAGNOSIS — Z86718 Personal history of other venous thrombosis and embolism: Secondary | ICD-10-CM

## 2012-04-01 ENCOUNTER — Ambulatory Visit: Payer: Medicare Other

## 2012-04-06 ENCOUNTER — Ambulatory Visit (INDEPENDENT_AMBULATORY_CARE_PROVIDER_SITE_OTHER): Payer: Medicare Other | Admitting: Family Medicine

## 2012-04-06 ENCOUNTER — Encounter: Payer: Self-pay | Admitting: Family Medicine

## 2012-04-06 VITALS — BP 112/62 | HR 78 | Temp 98.0°F | Wt 285.5 lb

## 2012-04-06 DIAGNOSIS — I5022 Chronic systolic (congestive) heart failure: Secondary | ICD-10-CM

## 2012-04-06 DIAGNOSIS — R197 Diarrhea, unspecified: Secondary | ICD-10-CM | POA: Insufficient documentation

## 2012-04-06 DIAGNOSIS — H6120 Impacted cerumen, unspecified ear: Secondary | ICD-10-CM

## 2012-04-06 DIAGNOSIS — E559 Vitamin D deficiency, unspecified: Secondary | ICD-10-CM

## 2012-04-06 DIAGNOSIS — M7500 Adhesive capsulitis of unspecified shoulder: Secondary | ICD-10-CM

## 2012-04-06 MED ORDER — QUETIAPINE FUMARATE 50 MG PO TABS: 50.0000 mg | ORAL_TABLET | Freq: Every day | ORAL | Status: DC

## 2012-04-06 MED ORDER — ACETAMINOPHEN-CODEINE #4 300-60 MG PO TABS: 1.0000 | ORAL_TABLET | ORAL | Status: DC | PRN

## 2012-04-06 MED ORDER — VITAMIN D (ERGOCALCIFEROL) 1.25 MG (50000 UNIT) PO CAPS: 50000.0000 [IU] | ORAL_CAPSULE | ORAL | Status: DC

## 2012-04-06 NOTE — Progress Notes (Signed)
  Subjective:    Patient ID: [name], male    DOB: 18-Aug-1940, 72 y.o.   MRN: [id number]  HPI  1.  Diarrhea:  Started 2 days ago.  Described loose stools, watery 2 or 3 times that day.  Had similar symptoms several weeks ago.  Has been using Metamucil but not providing any relief, had bloating as well with it and stopped it.  Taking stool softeners for several days but stopped due to perceived lack of efficacy.    2.  Tinnitus:  Right ear, feels clogged up.  Decreased hearing that side.  No pain.  No headaches, no recent URI.    3.  CHF:  Weights stable at home.  No trouble with edema.  No chest pain or palpitations.  No dyspnea. No chest pain.  Taking Bisoprolol and Lisinopril daily, brought medications with him and was able to check them.  Hypotension limits dosage for both of these medications.   Review of Systems See HPI above for review of systems.       Objective:   Physical Exam BP 112/62  Pulse 78  Temp 98 F (36.7 C) (Oral)  Wt 285 lb 8 oz (129.502 kg) Gen: Well NAD HEENT: EOMI,  MMM Lungs: scattered wheezes but otherwise clear Heart: Distant heart sounds.  RRR  Abd: NABS, NT, ND.  Obese.  Hyperactive bowel sounds.   Exts: +2 edema BL LE's at ankles, chronic venous stasis skin changes noted.         Assessment & Plan:

## 2012-04-06 NOTE — Assessment & Plan Note (Signed)
Still believe this is likely secondary to chronic constipation as alternating with periods of constipation.  If no improvement, plan for C dif/stool culture studies.  FU in 2 weeks for re-assessment, sooner if needed.

## 2012-04-06 NOTE — Assessment & Plan Note (Addendum)
Weights stable.  As above, hypotension limits up-titration of medication.   Continue diuertics for symptomatic relief.

## 2012-04-06 NOTE — Assessment & Plan Note (Signed)
Repletion today.  Unsure if insurance will pay for this.

## 2012-04-06 NOTE — Assessment & Plan Note (Signed)
To continue usage of arm as much as possible.  Refill Tylenol w/ codeine today.

## 2012-04-06 NOTE — Assessment & Plan Note (Signed)
Irrigated in clinic by staff. Examined s/p irrigation -- canals clear, TMs intact, mild irritation/erythema Right canal but otherwise good.

## 2012-04-06 NOTE — Patient Instructions (Addendum)
Take the Vitamin D once a week for 8 weeks.    Take the pain pills when you need them.  Refill for the Quetiapine (Seroquel).    Otherwise, things seem to be going well.  It was good to see you again today.

## 2012-04-16 ENCOUNTER — Other Ambulatory Visit: Payer: Self-pay | Admitting: Family Medicine

## 2012-04-21 ENCOUNTER — Ambulatory Visit: Payer: Medicare Other

## 2012-04-23 ENCOUNTER — Other Ambulatory Visit: Payer: Self-pay | Admitting: Family Medicine

## 2012-04-26 ENCOUNTER — Ambulatory Visit (INDEPENDENT_AMBULATORY_CARE_PROVIDER_SITE_OTHER): Payer: Medicare Other | Admitting: *Deleted

## 2012-04-26 DIAGNOSIS — Z7901 Long term (current) use of anticoagulants: Secondary | ICD-10-CM

## 2012-04-26 DIAGNOSIS — Z86718 Personal history of other venous thrombosis and embolism: Secondary | ICD-10-CM

## 2012-05-03 ENCOUNTER — Ambulatory Visit (INDEPENDENT_AMBULATORY_CARE_PROVIDER_SITE_OTHER): Payer: Medicare Other | Admitting: *Deleted

## 2012-05-03 DIAGNOSIS — Z86718 Personal history of other venous thrombosis and embolism: Secondary | ICD-10-CM

## 2012-05-03 DIAGNOSIS — Z7901 Long term (current) use of anticoagulants: Secondary | ICD-10-CM

## 2012-05-17 ENCOUNTER — Ambulatory Visit (INDEPENDENT_AMBULATORY_CARE_PROVIDER_SITE_OTHER): Payer: Medicare Other | Admitting: *Deleted

## 2012-05-17 LAB — POCT INR: INR: 1.7

## 2012-05-31 ENCOUNTER — Ambulatory Visit (INDEPENDENT_AMBULATORY_CARE_PROVIDER_SITE_OTHER): Payer: Medicare Other | Admitting: *Deleted

## 2012-05-31 DIAGNOSIS — Z7901 Long term (current) use of anticoagulants: Secondary | ICD-10-CM

## 2012-05-31 DIAGNOSIS — Z86718 Personal history of other venous thrombosis and embolism: Secondary | ICD-10-CM

## 2012-06-09 ENCOUNTER — Ambulatory Visit (INDEPENDENT_AMBULATORY_CARE_PROVIDER_SITE_OTHER): Payer: Medicare Other | Admitting: Cardiology

## 2012-06-09 ENCOUNTER — Encounter: Payer: Self-pay | Admitting: Cardiology

## 2012-06-09 VITALS — BP 132/78 | HR 69 | Ht 68.0 in | Wt 284.3 lb

## 2012-06-09 DIAGNOSIS — I2510 Atherosclerotic heart disease of native coronary artery without angina pectoris: Secondary | ICD-10-CM

## 2012-06-09 NOTE — Assessment & Plan Note (Signed)
Since last visit the patient has not had to take any sublingual nitroglycerin.  He is not having any exertional or rest pain.

## 2012-06-09 NOTE — Patient Instructions (Addendum)
Your physician recommends that you continue on your current medications as directed. Please refer to the Current Medication list given to you today.  Your physician wants you to follow-up in: 6 months. You will receive a reminder letter in the mail two months in advance. If you don't receive a letter, please call our office to schedule the follow-up appointment.  

## 2012-06-09 NOTE — Assessment & Plan Note (Signed)
Patient has a history of hypercholesterolemia followed by his PCP.  He has been on long-standing maximum dose simvastatin 80 mg daily.  Is not having any myalgias or side effects.Marland Kitchen

## 2012-06-09 NOTE — Progress Notes (Signed)
[name] Date of Birth:  09-08-1940 [facility] [address] [place], [place]  [address] [phone]  Fax   [phone]  HPI: This pleasant 72 year old gentleman is seen for a scheduled four-month followup office visit. He has a complex past medical history. 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 He has been on long-term Coumadin because of angina decubitus. In addition the patient has exogenous obesity, hypercholesterolemia, diabetes, osteoarthritis, and venous stasis dermatitis. He has adult onset diabetes mellitus. He has COPD. he no longer smokes. Recently the patient has been having a lot of problems with cellulitis and blistering ulcers of his lower extremities. This is [facility].  Since last visit he has been doing well.  He has been more careful with his diet and his weight is down 10 pounds.   Current Outpatient Prescriptions  Medication Sig Dispense Refill  . acetaminophen-codeine (TYLENOL #4) 300-60 MG per tablet Take 1 tablet by mouth every 4 (four) hours as needed for pain.  45 tablet  1  . aspirin (BABY ASPIRIN) 81 MG chewable tablet Chew 81 mg by mouth daily.       . bisoprolol (ZEBETA) 5 MG tablet Take 2.5 mg by mouth 2 (two) times daily.      . insulin NPH (HUMULIN N,NOVOLIN N) 100 UNIT/ML injection Inject 70 Units into the skin 2 (two) times daily.      Marland Kitchen lisinopril (PRINIVIL,ZESTRIL) 10 MG tablet Take 1 tablet (10 mg total) by mouth daily.  90 tablet  2  . nitroGLYCERIN (NITROSTAT) 0.4 MG SL tablet Place 0.4 mg under the tongue as needed. For chest pains      . omeprazole (PRILOSEC) 40 MG capsule TAKE ONE CAPSULE BY MOUTH EVERY DAY  30 capsule  2  . QUEtiapine (SEROQUEL) 50 MG tablet Take 1 tablet (50 mg total) by mouth at bedtime.  30 tablet  3  . simvastatin (ZOCOR) 80 MG tablet Take 80 mg by mouth at bedtime.      .  Tamsulosin HCl (FLOMAX) 0.4 MG CAPS TAKE ONE CAPSULE BY MOUTH EVERY DAY AFTER  BREAKFAST  90 capsule  1  . torsemide (DEMADEX) 20 MG tablet Take 1 tablet (20 mg total) by mouth daily.  60 tablet  3  . venlafaxine XR (EFFEXOR-XR) 37.5 MG 24 hr capsule TAKE ONE CAPSULE BY MOUTH EVERY DAY  30 capsule  4  . warfarin (COUMADIN) 5 MG tablet Take 5-7.5 mg by mouth daily. Take 5mg  every day, except take 7.5mg  on Monday and Friday       . zoster vaccine live, PF, (ZOSTAVAX) 24401 UNT/0.65ML injection Inject 19,400 Units into the skin once.  1 each  0   No current facility-administered medications for this visit.    Allergies  Allergen Reactions  . Diazepam Anxiety    REACTION: makes patient cry  . Vancomycin     "red man syndrome"    Patient Active Problem List  Diagnosis  . DIABETIC PERIPHERAL NEUROPATHY  . HYPERCHOLESTEROLEMIA  . ANXIETY  . DEPRESSION  . HYPERTENSION  . CORONARY, ARTERIOSCLEROSIS  . CHF - EJECTION FRACTION < 50%  . REFLUX ESOPHAGITIS  . FROZEN RIGHT SHOULDER  . APNEA, SLEEP  . Long term (current) use of anticoagulants  . BPH (benign prostatic hyperplasia)  . Stasis dermatitis  . COPD (chronic obstructive pulmonary disease)  . Vancomycin adverse reaction  . Peripheral vascular disease,  unspecified  . Diabetes mellitus out of control  . Fatigue  . Leg edema  . Knee pain, bilateral  . GERD (gastroesophageal reflux disease)  . Hemorrhoid  . Bradycardia  . Falls  . Vitamin D deficiency  . Diarrhea  . Cerumen impaction    History  Smoking status  . Former Smoker  . Quit date: 06/19/2000  Smokeless tobacco  . Never Used    History  Alcohol Use No    Family History  Problem Relation Age of Onset  . Heart disease Father     Review of Systems: The patient denies any heat or cold intolerance.  No weight gain or weight loss.  The patient denies headaches or blurry vision.  There is no cough or sputum production.  The patient denies dizziness.  There is  no hematuria or hematochezia.  The patient denies any muscle aches or arthritis.  The patient denies any rash.  The patient denies frequent falling or instability.  There is no history of depression or anxiety.  All other systems were reviewed and are negative.   Physical Exam: Filed Vitals:   06/09/12 1336  BP: 132/78  Pulse: 69   the general appearance feels a large gentleman in no acute distress.The head and neck exam reveals pupils equal and reactive.  Extraocular movements are full.  There is no scleral icterus.  The mouth and pharynx are normal.  The neck is supple.  The carotids reveal no bruits.  The jugular venous pressure is normal.  The  thyroid is not enlarged.  There is no lymphadenopathy.  The chest is clear to percussion and auscultation.  There are no rales or rhonchi.  Expansion of the chest is symmetrical.  The precordium is quiet.  The first heart sound is normal.  The second heart sound is physiologically split.  There is no murmur gallop rub or click.  There is no abnormal lift or heave.  The abdomen is soft and nontender.  The bowel sounds are normal.  The liver and spleen are not enlarged.  There are no abdominal masses.  There are no abdominal bruits.  Extremities reveal good pedal pulses.  There is chronic bilateral nonpitting lower extremity edema.  There is no cyanosis or clubbing.  Strength is normal and symmetrical in all extremities.  There is no lateralizing weakness.  There are no sensory deficits.  The skin is warm and dry.  There are chronic changes of stasis dermatitis bilaterally  EKG today shows sinus rhythm with first-degree AV block and no ischemic changes.    Assessment / Plan: Continue same medication.  Recheck in 6 months for followup office visit.  I commended him on his 10 pound weight loss.  He is having arthritis of his knees and further weight loss will help.

## 2012-06-09 NOTE — Assessment & Plan Note (Signed)
The patient has a history of chronic systolic congestive heart failure.  He appears to be well, compensated at present.  He is not having any orthopnea or paroxysmal nocturnal dyspnea.  His chronic peripheral edema is no worse.. his right leg now has more swelling than his left leg does

## 2012-06-14 ENCOUNTER — Other Ambulatory Visit: Payer: Self-pay | Admitting: Family Medicine

## 2012-06-14 ENCOUNTER — Ambulatory Visit (INDEPENDENT_AMBULATORY_CARE_PROVIDER_SITE_OTHER): Payer: Medicare Other | Admitting: *Deleted

## 2012-06-14 ENCOUNTER — Encounter: Payer: Self-pay | Admitting: *Deleted

## 2012-06-14 DIAGNOSIS — Z86718 Personal history of other venous thrombosis and embolism: Secondary | ICD-10-CM

## 2012-06-14 DIAGNOSIS — Z7901 Long term (current) use of anticoagulants: Secondary | ICD-10-CM

## 2012-06-14 NOTE — Progress Notes (Signed)
Corrected INR entered for 05-31-12.  Correct INR = 3.9    Warfarin was reduced from 7.5 mg - Tues & Fri, 5 mg - Sun, Mon, Wed, Thurs, Sat TO take 5 mg daily.  This dose adjustment now agrees with the changed INR value.   Dewitt Hoes, MLS (ASCP)cm

## 2012-06-15 ENCOUNTER — Other Ambulatory Visit: Payer: Self-pay | Admitting: *Deleted

## 2012-06-15 MED ORDER — SIMVASTATIN 80 MG PO TABS: 80.0000 mg | ORAL_TABLET | Freq: Every day | ORAL | Status: DC

## 2012-06-16 ENCOUNTER — Ambulatory Visit: Payer: Medicare Other | Admitting: Vascular Surgery

## 2012-06-28 ENCOUNTER — Ambulatory Visit (INDEPENDENT_AMBULATORY_CARE_PROVIDER_SITE_OTHER): Payer: Medicare Other | Admitting: *Deleted

## 2012-06-28 DIAGNOSIS — Z86718 Personal history of other venous thrombosis and embolism: Secondary | ICD-10-CM

## 2012-06-28 DIAGNOSIS — Z7901 Long term (current) use of anticoagulants: Secondary | ICD-10-CM

## 2012-07-19 ENCOUNTER — Encounter: Payer: Self-pay | Admitting: Home Health Services

## 2012-07-19 ENCOUNTER — Ambulatory Visit: Payer: Medicare Other

## 2012-07-19 DIAGNOSIS — Z9181 History of falling: Secondary | ICD-10-CM | POA: Insufficient documentation

## 2012-07-22 ENCOUNTER — Ambulatory Visit (INDEPENDENT_AMBULATORY_CARE_PROVIDER_SITE_OTHER): Payer: Medicare Other | Admitting: *Deleted

## 2012-07-22 DIAGNOSIS — Z7901 Long term (current) use of anticoagulants: Secondary | ICD-10-CM

## 2012-07-22 DIAGNOSIS — Z86718 Personal history of other venous thrombosis and embolism: Secondary | ICD-10-CM

## 2012-07-29 ENCOUNTER — Ambulatory Visit (INDEPENDENT_AMBULATORY_CARE_PROVIDER_SITE_OTHER): Payer: Medicare Other | Admitting: *Deleted

## 2012-07-29 DIAGNOSIS — Z86718 Personal history of other venous thrombosis and embolism: Secondary | ICD-10-CM

## 2012-07-29 DIAGNOSIS — Z7901 Long term (current) use of anticoagulants: Secondary | ICD-10-CM

## 2012-07-29 LAB — POCT INR: INR: 2.6

## 2012-07-30 ENCOUNTER — Other Ambulatory Visit: Payer: Self-pay | Admitting: Family Medicine

## 2012-07-30 MED ORDER — ACETAMINOPHEN-CODEINE #4 300-60 MG PO TABS: 1.0000 | ORAL_TABLET | ORAL | Status: DC | PRN

## 2012-08-12 ENCOUNTER — Ambulatory Visit: Payer: Medicare Other

## 2012-08-13 ENCOUNTER — Other Ambulatory Visit: Payer: Self-pay | Admitting: Family Medicine

## 2012-08-17 ENCOUNTER — Ambulatory Visit (INDEPENDENT_AMBULATORY_CARE_PROVIDER_SITE_OTHER): Payer: Medicare Other | Admitting: *Deleted

## 2012-08-17 ENCOUNTER — Other Ambulatory Visit: Payer: Self-pay | Admitting: Family Medicine

## 2012-08-17 DIAGNOSIS — Z86718 Personal history of other venous thrombosis and embolism: Secondary | ICD-10-CM

## 2012-08-17 DIAGNOSIS — Z7901 Long term (current) use of anticoagulants: Secondary | ICD-10-CM

## 2012-08-17 MED ORDER — ACETAMINOPHEN-CODEINE #4 300-60 MG PO TABS: 1.0000 | ORAL_TABLET | Freq: Four times a day (QID) | ORAL | Status: DC | PRN

## 2012-08-17 MED ORDER — TAMSULOSIN HCL 0.4 MG PO CAPS: 0.4000 mg | ORAL_CAPSULE | Freq: Every day | ORAL | Status: DC

## 2012-08-17 MED ORDER — LISINOPRIL 10 MG PO TABS: 10.0000 mg | ORAL_TABLET | Freq: Every day | ORAL | Status: DC

## 2012-08-17 MED ORDER — WARFARIN SODIUM 5 MG PO TABS: 5.0000 mg | ORAL_TABLET | Freq: Every day | ORAL | Status: DC

## 2012-08-24 ENCOUNTER — Ambulatory Visit: Payer: Medicare Other

## 2012-08-25 ENCOUNTER — Other Ambulatory Visit: Payer: Self-pay | Admitting: *Deleted

## 2012-08-25 ENCOUNTER — Ambulatory Visit (INDEPENDENT_AMBULATORY_CARE_PROVIDER_SITE_OTHER): Payer: Medicare Other | Admitting: *Deleted

## 2012-08-25 DIAGNOSIS — Z86718 Personal history of other venous thrombosis and embolism: Secondary | ICD-10-CM

## 2012-08-25 DIAGNOSIS — Z7901 Long term (current) use of anticoagulants: Secondary | ICD-10-CM

## 2012-08-25 LAB — POCT INR: INR: 2.6

## 2012-08-25 MED ORDER — BISOPROLOL FUMARATE 5 MG PO TABS: 2.5000 mg | ORAL_TABLET | Freq: Two times a day (BID) | ORAL | Status: DC

## 2012-08-31 ENCOUNTER — Other Ambulatory Visit: Payer: Self-pay | Admitting: *Deleted

## 2012-08-31 ENCOUNTER — Ambulatory Visit (INDEPENDENT_AMBULATORY_CARE_PROVIDER_SITE_OTHER): Payer: Medicare Other | Admitting: *Deleted

## 2012-08-31 DIAGNOSIS — Z7901 Long term (current) use of anticoagulants: Secondary | ICD-10-CM

## 2012-08-31 DIAGNOSIS — Z86718 Personal history of other venous thrombosis and embolism: Secondary | ICD-10-CM

## 2012-08-31 NOTE — Telephone Encounter (Signed)
Requested Prescriptions   Pending Prescriptions Disp Refills  . warfarin (COUMADIN) 5 MG tablet 60 tablet 3    Sig: Take 1-1.5 tablets (5-7.5 mg total) by mouth daily. Take 5mg  every day, except take 7.5mg  on Monday and Friday   Wyatt Haste, RN-BSN

## 2012-09-01 MED ORDER — WARFARIN SODIUM 5 MG PO TABS: 5.0000 mg | ORAL_TABLET | Freq: Every day | ORAL | Status: DC

## 2012-09-07 ENCOUNTER — Ambulatory Visit (INDEPENDENT_AMBULATORY_CARE_PROVIDER_SITE_OTHER): Payer: Medicare Other | Admitting: *Deleted

## 2012-09-07 DIAGNOSIS — Z86718 Personal history of other venous thrombosis and embolism: Secondary | ICD-10-CM

## 2012-09-07 DIAGNOSIS — Z7901 Long term (current) use of anticoagulants: Secondary | ICD-10-CM

## 2012-09-07 LAB — POCT INR: INR: 3.3

## 2012-09-14 ENCOUNTER — Ambulatory Visit (INDEPENDENT_AMBULATORY_CARE_PROVIDER_SITE_OTHER): Payer: Medicare Other | Admitting: *Deleted

## 2012-09-14 DIAGNOSIS — Z86718 Personal history of other venous thrombosis and embolism: Secondary | ICD-10-CM

## 2012-09-14 DIAGNOSIS — Z7901 Long term (current) use of anticoagulants: Secondary | ICD-10-CM

## 2012-09-22 ENCOUNTER — Other Ambulatory Visit: Payer: Self-pay | Admitting: Family Medicine

## 2012-09-28 ENCOUNTER — Ambulatory Visit (INDEPENDENT_AMBULATORY_CARE_PROVIDER_SITE_OTHER): Payer: Medicare Other | Admitting: *Deleted

## 2012-09-28 DIAGNOSIS — Z7901 Long term (current) use of anticoagulants: Secondary | ICD-10-CM

## 2012-09-28 DIAGNOSIS — Z86718 Personal history of other venous thrombosis and embolism: Secondary | ICD-10-CM

## 2012-10-11 ENCOUNTER — Ambulatory Visit: Payer: Medicare Other | Admitting: Neurosurgery

## 2012-10-12 ENCOUNTER — Ambulatory Visit (INDEPENDENT_AMBULATORY_CARE_PROVIDER_SITE_OTHER): Payer: Medicare Other | Admitting: *Deleted

## 2012-10-12 DIAGNOSIS — Z86718 Personal history of other venous thrombosis and embolism: Secondary | ICD-10-CM

## 2012-10-12 DIAGNOSIS — Z7901 Long term (current) use of anticoagulants: Secondary | ICD-10-CM

## 2012-10-27 ENCOUNTER — Ambulatory Visit: Payer: Medicare Other

## 2012-11-09 ENCOUNTER — Ambulatory Visit (INDEPENDENT_AMBULATORY_CARE_PROVIDER_SITE_OTHER): Payer: Medicare Other | Admitting: *Deleted

## 2012-11-09 ENCOUNTER — Other Ambulatory Visit: Payer: Medicare Other

## 2012-11-09 DIAGNOSIS — Z7901 Long term (current) use of anticoagulants: Secondary | ICD-10-CM

## 2012-11-09 DIAGNOSIS — Z86718 Personal history of other venous thrombosis and embolism: Secondary | ICD-10-CM

## 2012-11-09 LAB — POCT INR: INR: 1.7

## 2012-11-24 ENCOUNTER — Other Ambulatory Visit: Payer: Self-pay | Admitting: Family Medicine

## 2012-11-24 ENCOUNTER — Ambulatory Visit: Payer: Medicare Other

## 2012-11-30 ENCOUNTER — Ambulatory Visit (INDEPENDENT_AMBULATORY_CARE_PROVIDER_SITE_OTHER): Payer: Medicare Other | Admitting: *Deleted

## 2012-11-30 DIAGNOSIS — Z7901 Long term (current) use of anticoagulants: Secondary | ICD-10-CM

## 2012-11-30 DIAGNOSIS — Z86718 Personal history of other venous thrombosis and embolism: Secondary | ICD-10-CM

## 2012-11-30 LAB — POCT INR: INR: 2

## 2012-12-09 ENCOUNTER — Ambulatory Visit (INDEPENDENT_AMBULATORY_CARE_PROVIDER_SITE_OTHER): Payer: Medicare Other | Admitting: *Deleted

## 2012-12-09 ENCOUNTER — Encounter: Payer: Self-pay | Admitting: Family Medicine

## 2012-12-09 ENCOUNTER — Ambulatory Visit (INDEPENDENT_AMBULATORY_CARE_PROVIDER_SITE_OTHER): Payer: Medicare Other | Admitting: Family Medicine

## 2012-12-09 VITALS — BP 112/67 | HR 86 | Temp 98.2°F | Wt 275.0 lb

## 2012-12-09 DIAGNOSIS — M25559 Pain in unspecified hip: Secondary | ICD-10-CM

## 2012-12-09 DIAGNOSIS — Z9181 History of falling: Secondary | ICD-10-CM

## 2012-12-09 DIAGNOSIS — Z86718 Personal history of other venous thrombosis and embolism: Secondary | ICD-10-CM

## 2012-12-09 DIAGNOSIS — M25552 Pain in left hip: Secondary | ICD-10-CM

## 2012-12-09 DIAGNOSIS — Z7901 Long term (current) use of anticoagulants: Secondary | ICD-10-CM

## 2012-12-09 DIAGNOSIS — E1149 Type 2 diabetes mellitus with other diabetic neurological complication: Secondary | ICD-10-CM

## 2012-12-09 DIAGNOSIS — E1165 Type 2 diabetes mellitus with hyperglycemia: Secondary | ICD-10-CM

## 2012-12-09 DIAGNOSIS — M7501 Adhesive capsulitis of right shoulder: Secondary | ICD-10-CM

## 2012-12-09 DIAGNOSIS — M7500 Adhesive capsulitis of unspecified shoulder: Secondary | ICD-10-CM

## 2012-12-09 DIAGNOSIS — J449 Chronic obstructive pulmonary disease, unspecified: Secondary | ICD-10-CM

## 2012-12-09 LAB — POCT GLYCOSYLATED HEMOGLOBIN (HGB A1C): Hemoglobin A1C: 7.9

## 2012-12-09 LAB — POCT INR: INR: 2.6

## 2012-12-09 MED ORDER — ACETAMINOPHEN-CODEINE #4 300-60 MG PO TABS: 1.0000 | ORAL_TABLET | Freq: Four times a day (QID) | ORAL | Status: DC | PRN

## 2012-12-09 NOTE — Progress Notes (Signed)
Subjective:    [name] is a 72 y.o. male who presents to [facility] today for chronic issues:  1.   Diabetes:  Currently on  Humulin 70units twice daily.  Has forgotten this "at times."  No adverse effects from medication.  No hypoglycemic events.  Has some paresthesias which are new for him.  Measures blood sugars at home every: other day or so.     Lab Results  Component Value Date   HGBA1C 7.9 12/09/2012   2.  Leg pain:  Upper left thigh pain which is new for past several months.  Also pain in Left buttocks with raising leg or with prolonged standing.  Has had hip replacement x 2 this side, last many years ago.  Has fallen twice in past 2 months.  Falls on uneven ground when outside.  Has lightheadedness when stands and this causes him to fall.  No LOC, has not hit head, lands on buttocks  3.  Foot pain:  Describes burning in both of his feet, worse in Right.  Left side "burning" in toes, RIght side burning in toes and sole of foot.  Present several months.  No fevers/chills.  No motor weakness.  Falls as above.    Prev health:   Currently overdue for flu, declines this today.  The following portions of the patient's history were reviewed and updated as appropriate: allergies, current medications, past medical history, family and social history, and problem list. Patient is a nonsmoker.    PMH reviewed.  Past Medical History  Diagnosis Date  . Depression   . Diabetes mellitus   . COPD (chronic obstructive pulmonary disease)   . Adhesive capsulitis   . MRSA bacteremia     2011 - possible endocarditis, received 6 weeks IV treatment  . Hyperlipidemia   . Anxiety   . GERD (gastroesophageal reflux disease)   . CAD (coronary artery disease)   . 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 CVA (cerebral infarction) Questionable history  . Diverticulosis   . Osteomyelitis 2012    Sternoclavicular joint   . Chronic back pain   . Angina   . Myocardial infarction    . CHF (congestive heart failure)   . Shortness of breath   . Sleep apnea   . Chronic kidney disease     hx of BPH  . Neuromuscular disorder     HX of diabetic periferal neuropathy  . Hypertension   . Peripheral vascular disease    Past Surgical History  Procedure Laterality Date  . Septic arthritis      Removal of infected CABG wire by Dr Edwyna Shell - 2011  . Coronary artery bypass graft  1992  . Fracture surgery  1980s    Hip  . Spine surgery  2002    Cervical fusion vertebroplasty C3-4-5  . Coronary stent placement  1999, 2002  . Doppler echocardiography  Sept 2011    EF 50-55% with some impaired diastolic relaxation  . Colonoscopy  2006     Multiple polyps removed, repeat in 5 years  . Right iliopopliteal bypass - 1983  1983  . Femoral-popliteal bypass graft  2008    Left  . Cardiac catheterization  October 18, 2010    Known obstructive disease, no further blockages  . Cardiac catheterization  03/17/2005    EF 35-40%  . US echocardiography  03/04/2006    EF 50-55%  . Amputation  03/16/2011    Procedure: AMPUTATION  DIGIT;  Surgeon: Chuck Hint, MD;  Location: [facility] OR;  Service: Vascular;  Laterality: Left;  Great toe    Medications reviewed. Current Outpatient Prescriptions  Medication Sig Dispense Refill  . acetaminophen-codeine (TYLENOL #4) 300-60 MG per tablet Take 1 tablet by mouth every 6 (six) hours as needed for pain.  60 tablet  1  . aspirin (BABY ASPIRIN) 81 MG chewable tablet Chew 81 mg by mouth daily.       . bisoprolol (ZEBETA) 5 MG tablet Take 0.5 tablets (2.5 mg total) by mouth 2 (two) times daily.  90 tablet  3  . insulin NPH (HUMULIN N,NOVOLIN N) 100 UNIT/ML injection Inject 70 Units into the skin 2 (two) times daily.      Marland Kitchen lisinopril (PRINIVIL,ZESTRIL) 10 MG tablet Take 1 tablet (10 mg total) by mouth daily.  90 tablet  2  . nitroGLYCERIN (NITROSTAT) 0.4 MG SL tablet Place 0.4 mg under the tongue as needed. For chest pains      . omeprazole  (PRILOSEC) 40 MG capsule TAKE ONE CAPSULE BY MOUTH EVERY DAY  30 capsule  2  . QUEtiapine (SEROQUEL) 50 MG tablet Take 1 tablet (50 mg total) by mouth at bedtime.  30 tablet  3  . QUEtiapine (SEROQUEL) 50 MG tablet TAKE ONE TABLET BY MOUTH AT BEDTIME  30 tablet  2  . simvastatin (ZOCOR) 80 MG tablet TAKE ONE TABLET BY MOUTH AT BEDTIME  90 tablet  1  . tamsulosin (FLOMAX) 0.4 MG CAPS Take 1 capsule (0.4 mg total) by mouth daily after supper.  90 capsule  1  . torsemide (DEMADEX) 20 MG tablet Take 1 tablet (20 mg total) by mouth daily.  60 tablet  3  . venlafaxine XR (EFFEXOR-XR) 37.5 MG 24 hr capsule TAKE ONE CAPSULE BY MOUTH EVERY DAY  30 capsule  2  . warfarin (COUMADIN) 5 MG tablet Take 1-1.5 tablets (5-7.5 mg total) by mouth daily. Take 5mg  every day, except take 7.5mg  on Monday and Friday  60 tablet  3  . zoster vaccine live, PF, (ZOSTAVAX) 14782 UNT/0.65ML injection Inject 19,400 Units into the skin once.  1 each  0   No current facility-administered medications for this visit.    ROS as above otherwise neg.   Objective:   Physical Exam BP 112/67  Pulse 86  Temp(Src) 98.2 F (36.8 C) (Oral)  Wt 275 lb (124.739 kg)  BMI 41.82 kg/m2 Gen:  Alert, cooperative patient who appears stated age in no acute distress.  Vital signs reviewed.  Somewhat disheveled appearing, which is normal for him.   HEENT: EOMI,  MMM Cardiac:  Regular rate and rhythm  Pulm:  Clear to auscultation bilaterally with fair air movement.  No wheezing.   Abd:  Soft/nondistended/nontender. MSK:  Nontender back exam.  TTP over Left SI joint and Left greater trochanter.  Good internal/external hip rotation without pain.  Exts: +3 edema BL shins, plus chronic venous stasis changes L>R Feet;  Right foot with no sensation below ankles.  No DP pulses palpated.  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  Insensate below mid-shin.  No DP pulses palpated.    Results for orders placed in visit on 12/09/12 (from the past 72  hour(s))  POCT GLYCOSYLATED HEMOGLOBIN (HGB A1C)     Status: None   Collection Time    12/09/12  1:40 PM      Result Value Range   Hemoglobin A1C 7.9

## 2012-12-09 NOTE — Patient Instructions (Addendum)
I want you to make an appointment with Rosalita Chessman the health coach for a wellness visit.  She will scan your eyes.  We should do this every year.  I will contact the Geriatrics team about a home visit to check on you.  I also want you to make an appointment with the diabetes clinic before you leave today.  Come back to see me between Thanksgiving and Christmas.    Bloodwork today.    Refill for Tylenol/codeine today.

## 2012-12-10 DIAGNOSIS — M25552 Pain in left hip: Secondary | ICD-10-CM | POA: Insufficient documentation

## 2012-12-10 NOTE — Assessment & Plan Note (Signed)
Lung exam good today. Has inhalers currently but has not needed rescue inhaler.

## 2012-12-10 NOTE — Assessment & Plan Note (Signed)
Would benefit from visit with [facility] coach as well as [facility] CBGs running high at home but his A1C isn't bad.   Told him to make appt today with Rosalita Chessman.

## 2012-12-10 NOTE — Assessment & Plan Note (Signed)
Refer to [facility] clinic for home visit

## 2012-12-10 NOTE — Assessment & Plan Note (Signed)
Foot exam as above. Would like to limit sedating meds that might promote falls. Think he would benefit from home visit -- will contact Geri team regarding this.

## 2012-12-10 NOTE — Assessment & Plan Note (Signed)
New problem for patient Repeat hip xray as he has already had multiple replacements and has had several recent falls.

## 2012-12-10 NOTE — Assessment & Plan Note (Signed)
Chronic. Refill for codeine provided.

## 2012-12-16 ENCOUNTER — Encounter: Payer: Self-pay | Admitting: Family Medicine

## 2012-12-16 ENCOUNTER — Ambulatory Visit
Admission: RE | Admit: 2012-12-16 | Discharge: 2012-12-16 | Disposition: A | Discharge status: Home / Self Care | Payer: Medicare Other | Source: Ambulatory Visit | Attending: Family Medicine | Admitting: Family Medicine

## 2012-12-16 DIAGNOSIS — E1149 Type 2 diabetes mellitus with other diabetic neurological complication: Secondary | ICD-10-CM

## 2012-12-16 DIAGNOSIS — M25552 Pain in left hip: Secondary | ICD-10-CM

## 2012-12-16 NOTE — Progress Notes (Addendum)
[name] is a 72 y.o. male who was seen today for a home visit. Pt seen at home by Dr's Randolm Idol and Konrad Dolores  Pt reports numerous recent falls. Many are related to changes in poisition such as going from lying/sitting to standing, becoming lightheaded and then falling. Denies any head trauma or significant injury from the falls other than stubbing his R great toe the other day. Other falls come from trying to navigate his home and front stairs. Reports a h/o hip injury from surgery followed by reconstructive hip surgery. These falls come w/o feeling lightehaded. Denies any LOC, sezure like activity. Pt is going for L hip MRI tomorrow (12/17/12), per PCP orders.  Pt also reports regular peripheral pain in toes, that is a burning to tingling numbness/sensation. This is somewhat relieved w/ T4 but denies any use of nauropathic pain regimens such as gabapentin ro lyrica. This has been present for years.   Pt was able to present his medications and verify they were being taken as prescribed. Pt lives at home w/ wife and daughter who comes to visit on a regular basis.  Pt denies any current CP, SOB, palpitations, syncope.   The following portions of the patient's history were reviewed and updated as appropriate: allergies, current medications, past medical history, family and social history, and problem list.  Patient is a previous  Past Medical History  Diagnosis Date  . Depression   . Diabetes mellitus   . COPD (chronic obstructive pulmonary disease)   . Adhesive capsulitis   . MRSA bacteremia     2011 - possible endocarditis, received 6 weeks IV treatment  . Hyperlipidemia   . Anxiety   . GERD (gastroesophageal reflux disease)   . CAD (coronary artery disease)   . 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 CVA (cerebral infarction) Questionable history  . Diverticulosis   . Osteomyelitis 2012    Sternoclavicular joint   . Chronic back pain   . Angina   .  Myocardial infarction   . CHF (congestive heart failure)   . Shortness of breath   . Sleep apnea   . Chronic kidney disease     hx of BPH  . Neuromuscular disorder     HX of diabetic periferal neuropathy  . Hypertension   . Peripheral vascular disease     ROS as above otherwise neg.    Medications reviewed. Current Outpatient Prescriptions  Medication Sig Dispense Refill  . acetaminophen-codeine (TYLENOL #4) 300-60 MG per tablet Take 1 tablet by mouth every 6 (six) hours as needed for pain.  60 tablet  1  . aspirin (BABY ASPIRIN) 81 MG chewable tablet Chew 81 mg by mouth daily.       . bisoprolol (ZEBETA) 5 MG tablet Take 0.5 tablets (2.5 mg total) by mouth 2 (two) times daily.  90 tablet  3  . insulin NPH (HUMULIN N,NOVOLIN N) 100 UNIT/ML injection Inject 70 Units into the skin 2 (two) times daily.      Marland Kitchen lisinopril (PRINIVIL,ZESTRIL) 10 MG tablet Take 1 tablet (10 mg total) by mouth daily.  90 tablet  2  . nitroGLYCERIN (NITROSTAT) 0.4 MG SL tablet Place 0.4 mg under the tongue as needed. For chest pains      . omeprazole (PRILOSEC) 40 MG capsule TAKE ONE CAPSULE BY MOUTH EVERY DAY  30 capsule  2  . QUEtiapine (SEROQUEL) 50 MG tablet Take 1 tablet (50 mg total) by mouth at  bedtime.  30 tablet  3  . QUEtiapine (SEROQUEL) 50 MG tablet TAKE ONE TABLET BY MOUTH AT BEDTIME  30 tablet  2  . simvastatin (ZOCOR) 80 MG tablet TAKE ONE TABLET BY MOUTH AT BEDTIME  90 tablet  1  . tamsulosin (FLOMAX) 0.4 MG CAPS Take 1 capsule (0.4 mg total) by mouth daily after supper.  90 capsule  1  . torsemide (DEMADEX) 20 MG tablet Take 1 tablet (20 mg total) by mouth daily.  60 tablet  3  . venlafaxine XR (EFFEXOR-XR) 37.5 MG 24 hr capsule TAKE ONE CAPSULE BY MOUTH EVERY DAY  30 capsule  2  . warfarin (COUMADIN) 5 MG tablet Take 1-1.5 tablets (5-7.5 mg total) by mouth daily. Take 5mg  every day, except take 7.5mg  on Monday and Friday  60 tablet  3  . zoster vaccine live, PF, (ZOSTAVAX) 40981 UNT/0.65ML  injection Inject 19,400 Units into the skin once.  1 each  0   No current facility-administered medications for this visit.    Exam:  There were no vitals taken for this visit. Gen: Well NAD, obese HEENT: EOMI,  MMM,  Lungs: mild end expiratory wheezing bilat. Nl WOB Heart: quiet heart sounds, RRR no MRG Abd: NABS, NT, ND Exts: 1+ LE edema. Faint LE pulses, warm Skin: hyperpigmentation of LE w/ bandage on R shin, R great toe w/ well healing <1cm skin tear near the end of the toenail w/o purulent DC or erythema. Socks over wound was c/d. No ulcerations of the foot/between toes Neuro: CN1-12 grossly intact,  MSK: wide based gate w/ some instability requiring support from walking cane. Stiffness noted over movement of the L hip.    No results found for this or any previous visit (from the past 72 hour(s)).  A/P 72 yo M w/ multiple chronic medical problems w/ recurrent falls and decrease sensation in extremities  Falls: likely multifactorial. Pt w/ orthostatic complaints and mechanical falls. Pt noted to have systolic CHF and on bblocker and ACE. Not sure how much lower he can/should go on these medications as he will lose the benefit from a CHF standpoint. Unable to check pressures while at the home but pt reports SBP in the low 100s which is verified by what is noted in the chart over numerous clinic appts. Of note pt also on flomax which can also decrease pressure, so may want to try decreased dose. Pt counseled to arise slowly and ensure he is stable before moving forward. Pt also w/ significant MSK disease, primarily of the hip leading to mechanical falls. Of note pt w/ bench in shower/tub but denies having a grab bar. Also pt steps going in and out of home is w/o a railing.  - consider decreasing BP meds (weigh risk/benefit due to CHF) - consider decrease flomax - DME for grab bar in tub - recommend home construction of railing - orthostatics at next clinic appt  - f/u MRI report of L  hip. - continue warfarin as benefits outweigh risks - consider PT to assist w/ ROM and regaining strength/mobility  Neuropathy: Likely secondary to PVD primarily from DM, previously hypertensive, and h/o smoking. Numerous revious vascular surgeries. Obvious venous stasis on exam w/ poor blood flow. Amputation of L great toe noted. This makes ambulation difficult for pt, especially when going outside. Pt unsure if he has tried any neuropathic pain medications such as gabapentin or lyrica - continue diabetic shoes and meeting w/ podiatrist - recommend trying gabapentin, lyrica, or nortriptyline.  -  recommend possible workup for other causes of periopheral neuropathy such as Vit B12/folate - consider adding metformin to DM regimen if not already tried  Of note pt appears compliant w/ medications as prescribed by PCP.    Attending Note  Patient was seen and evaluated with the resident. I agree with the resident documentation.   1. Falls/Debility - multifactorial (MSK/pain/OA/peripheral neuropathy/?orthostasis), uses cain to help with ambulation, also has walker however does not use regularly, patient scheduled for upcoming MRI left hip to evaluate for loosening of hip replacement, patient would likely benefit from PT again, Peripheral neuropathy could benefit from Gabapentin/Lyrica - need to weigh risk of increased somnolence/LE swelling, will defer decision for Gabapentin/Lyrica to PCP, agree with check for orthostatic hypotension at next visit.   2. Medication Adherence - patient has all medications present with him, all medications matched list from [facility], he does not use a pill dispenser, It is unclear if he actually takes all medications as prescribed, patient not interested in pill dispenser/bubble pack at this time  Disposition: Patient was discussed with PCP and SW, our SW will contact him to see if he could benefit from regular home health, also patient could benefit from hand rails outside  the home however this is unfortunately an out of pocket/volunteer expense, patient was make aware of need for hand rails on outside stairs. Does use shower chair    Donnella Sham MD

## 2012-12-16 NOTE — Assessment & Plan Note (Signed)
Falls: likely multifactorial. Pt w/ orthostatic complaints and mechanical falls. Pt noted to have systolic CHF and on bblocker and ACE. Not sure how much lower he can/should go on these medications as he will lose the benefit from a CHF standpoint. Unable to check pressures while at the home but pt reports SBP in the low 100s which is verified by what is noted in the chart over numerous clinic appts. Of note pt also on flomax which can also decrease pressure, so may want to try decreased dose. Pt counseled to arise slowly and ensure he is stable before moving forward. Pt also w/ significant MSK disease, primarily of the hip leading to mechanical falls. Of note pt w/ bench in shower/tub but denies having a grab bar. Also pt steps going in and out of home is w/o a railing.  - consider decreasing BP meds (weigh risk/benefit due to CHF) - consider decrease flomax - DME for grab bar in tub - recommend home construction of railing - orthostatics at next clinic appt  - f/u MRI report of L hip. - continue warfarin as benefits outweigh risks - consider PT to assist w/ ROM and regaining strength/mobility

## 2012-12-16 NOTE — Assessment & Plan Note (Signed)
Neuropathy: Likely secondary to PVD primarily from DM, previously hypertensive, and h/o smoking. Numerous revious vascular surgeries. Obvious venous stasis on exam w/ poor blood flow. Amputation of L great toe noted. This makes ambulation difficult for pt, especially when going outside. Pt unsure if he has tried any neuropathic pain medications such as gabapentin or lyrica - continue diabetic shoes and meeting w/ podiatrist - recommend trying gabapentin, lyrica, or nortriptyline.  - recommend possible workup for other causes of periopheral neuropathy such as Vit B12/folate - consider adding metformin to DM regimen if not already tried

## 2012-12-17 ENCOUNTER — Telehealth: Payer: Self-pay | Admitting: Family Medicine

## 2012-12-17 NOTE — Addendum Note (Signed)
Addended by: Uvaldo Rising on: 12/17/2012 09:26 AM   Modules accepted: Level of Service
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
## 2012-12-23 ENCOUNTER — Telehealth: Payer: Self-pay | Admitting: Clinical

## 2012-12-23 ENCOUNTER — Ambulatory Visit (INDEPENDENT_AMBULATORY_CARE_PROVIDER_SITE_OTHER): Payer: Medicare Other | Admitting: *Deleted

## 2012-12-23 DIAGNOSIS — Z86718 Personal history of other venous thrombosis and embolism: Secondary | ICD-10-CM

## 2012-12-23 DIAGNOSIS — Z7901 Long term (current) use of anticoagulants: Secondary | ICD-10-CM

## 2012-12-23 LAB — POCT INR: INR: 3.9

## 2012-12-23 NOTE — Telephone Encounter (Signed)
Clinical Child psychotherapist (CSW) received a referral for pt to explore home needs/pt has hx of falls. CSW left a message for pt however CSW will try to see  in clinic today after pt appt.  Theresia Bough, MSW, LCSW [phone]

## 2013-01-06 ENCOUNTER — Ambulatory Visit (INDEPENDENT_AMBULATORY_CARE_PROVIDER_SITE_OTHER): Payer: Medicare Other | Admitting: *Deleted

## 2013-01-06 DIAGNOSIS — Z86718 Personal history of other venous thrombosis and embolism: Secondary | ICD-10-CM

## 2013-01-06 DIAGNOSIS — Z7901 Long term (current) use of anticoagulants: Secondary | ICD-10-CM

## 2013-01-06 LAB — POCT INR: INR: 1.6

## 2013-01-20 ENCOUNTER — Ambulatory Visit (INDEPENDENT_AMBULATORY_CARE_PROVIDER_SITE_OTHER): Payer: Medicare Other | Admitting: Home Health Services

## 2013-01-20 ENCOUNTER — Ambulatory Visit (INDEPENDENT_AMBULATORY_CARE_PROVIDER_SITE_OTHER): Payer: Medicare Other | Admitting: *Deleted

## 2013-01-20 VITALS — BP 122/60 | Temp 97.5°F | Ht 68.0 in | Wt 273.8 lb

## 2013-01-20 DIAGNOSIS — Z86718 Personal history of other venous thrombosis and embolism: Secondary | ICD-10-CM

## 2013-01-20 DIAGNOSIS — Z23 Encounter for immunization: Secondary | ICD-10-CM

## 2013-01-20 DIAGNOSIS — Z0000 Encounter for general adult medical examination without abnormal findings: Secondary | ICD-10-CM

## 2013-01-20 DIAGNOSIS — Z7901 Long term (current) use of anticoagulants: Secondary | ICD-10-CM

## 2013-01-20 DIAGNOSIS — E7800 Pure hypercholesterolemia, unspecified: Secondary | ICD-10-CM

## 2013-01-20 LAB — POCT INR: INR: 2.6

## 2013-01-21 ENCOUNTER — Encounter: Payer: Self-pay | Admitting: Home Health Services

## 2013-01-21 NOTE — Progress Notes (Signed)
Patient here for annual wellness visit, patient reports: Risk Factors/Conditions needing evaluation or treatment: Pt reports several falls of the past year with very little injury. Pt failed all hearing screening at 40Hz . Home Safety: Pt lives with wife in 1 story home. Pt reports having smoke detectors and adaptive equipment in the bathroom. Other Information: Corrective lens: Pt has reading glasses but wears them infrequently.  Has not had eye exam for several year.  Encourage pt to schedule appt with Dr. Katrina Stack for DM eye review. Dentures: Pt has full upper dentures. Memory: Pt reports so memory problems. Patient's Mini Mental Score (recorded in doc. flowsheet): 29  Balance/Gait: Pt reports several fall in past year.  Uses cane, doesn't not like walkers  Balance Abnormal Patient value  Sitting balance    Sit to stand x Must use arms  Attempts to arise    Immediate standing balance x   Standing balance x   Nudge x   Eyes closed- Romberg x   Tandem stance x   Back lean x   Neck Rotation    360 degree turn    Sitting down     Gait Abnormal Patient value  Initiation of gait x   Step length-left x   Step length-right x   Step height-left x   Step height-right x   Step symmetry x   Step continuity    Path deviation    Trunk movement x   Walking stance        Annual Wellness Visit Requirements Recorded Today In  Medical, family, social history Past Medical, Family, Social History Section  Current providers Care team  Current medications Medications  Wt, BP, Ht, BMI Vital signs  Hearing assessment (welcome visit) Hearing/Vision  Tobacco, alcohol, illicit drug use History  ADL Nurse Assessment  Depression Screening Nurse Assessment  Cognitive impairment Nurse Assessment  Mini Mental Status Document Flowsheet  Fall Risk Fall/Depression  Home Safety Progress Note  End of Life Planning (welcome visit) Social Documentation  Medicare preventative services Progress Note   Risk factors/conditions needing evaluation/treatment Progress Note  Personalized health advice Patient Instructions, goals, letter  Diet & Exercise Social Documentation  Emergency Contact Social Documentation  Seat Belts Social Documentation  Sun exposure/protection Social Documentation

## 2013-01-22 ENCOUNTER — Other Ambulatory Visit: Payer: Self-pay | Admitting: Family Medicine

## 2013-01-26 NOTE — Progress Notes (Signed)
I have reviewed this visit and discussed with Suzanne Lineberry and agree with her documentation  

## 2013-02-03 ENCOUNTER — Ambulatory Visit: Payer: Medicare Other

## 2013-02-04 ENCOUNTER — Ambulatory Visit (INDEPENDENT_AMBULATORY_CARE_PROVIDER_SITE_OTHER): Payer: Medicare Other | Admitting: *Deleted

## 2013-02-04 DIAGNOSIS — Z7901 Long term (current) use of anticoagulants: Secondary | ICD-10-CM

## 2013-02-04 DIAGNOSIS — Z86718 Personal history of other venous thrombosis and embolism: Secondary | ICD-10-CM

## 2013-02-04 LAB — POCT INR: INR: 3.2

## 2013-02-14 ENCOUNTER — Ambulatory Visit (INDEPENDENT_AMBULATORY_CARE_PROVIDER_SITE_OTHER): Payer: Medicare Other | Admitting: *Deleted

## 2013-02-14 DIAGNOSIS — Z86718 Personal history of other venous thrombosis and embolism: Secondary | ICD-10-CM

## 2013-02-14 DIAGNOSIS — Z7901 Long term (current) use of anticoagulants: Secondary | ICD-10-CM

## 2013-02-14 LAB — POCT INR: INR: 2.5

## 2013-02-16 ENCOUNTER — Ambulatory Visit: Payer: Medicare Other

## 2013-02-28 ENCOUNTER — Ambulatory Visit (INDEPENDENT_AMBULATORY_CARE_PROVIDER_SITE_OTHER): Payer: Medicare Other | Admitting: *Deleted

## 2013-02-28 DIAGNOSIS — Z86718 Personal history of other venous thrombosis and embolism: Secondary | ICD-10-CM

## 2013-02-28 DIAGNOSIS — Z7901 Long term (current) use of anticoagulants: Secondary | ICD-10-CM

## 2013-03-01 ENCOUNTER — Telehealth: Payer: Self-pay | Admitting: Family Medicine

## 2013-03-01 MED ORDER — QUETIAPINE FUMARATE 50 MG PO TABS: 50.0000 mg | ORAL_TABLET | Freq: Every day | ORAL | Status: DC

## 2013-03-01 NOTE — Telephone Encounter (Signed)
Message copied by Tobey Grim on Tue Mar 01, 2013  8:18 AM ------      Message from: Jennette Bill      Created: Mon Feb 28, 2013  3:19 PM      Regarding: refill       Patient requests refill on seroquel ------

## 2013-03-07 ENCOUNTER — Ambulatory Visit: Payer: Medicare Other

## 2013-03-10 ENCOUNTER — Encounter: Payer: Self-pay | Admitting: Family Medicine

## 2013-03-10 ENCOUNTER — Ambulatory Visit (INDEPENDENT_AMBULATORY_CARE_PROVIDER_SITE_OTHER): Payer: Medicare Other | Admitting: Family Medicine

## 2013-03-10 VITALS — BP 140/56 | HR 78 | Temp 97.9°F | Ht 68.0 in | Wt 278.9 lb

## 2013-03-10 DIAGNOSIS — M25561 Pain in right knee: Secondary | ICD-10-CM

## 2013-03-10 DIAGNOSIS — E1149 Type 2 diabetes mellitus with other diabetic neurological complication: Secondary | ICD-10-CM

## 2013-03-10 DIAGNOSIS — E1165 Type 2 diabetes mellitus with hyperglycemia: Secondary | ICD-10-CM

## 2013-03-10 DIAGNOSIS — F411 Generalized anxiety disorder: Secondary | ICD-10-CM

## 2013-03-10 DIAGNOSIS — G4730 Sleep apnea, unspecified: Secondary | ICD-10-CM

## 2013-03-10 DIAGNOSIS — M25569 Pain in unspecified knee: Secondary | ICD-10-CM

## 2013-03-10 DIAGNOSIS — I10 Essential (primary) hypertension: Secondary | ICD-10-CM

## 2013-03-10 LAB — LDL CHOLESTEROL, DIRECT: Direct LDL: 92 mg/dL

## 2013-03-10 LAB — POCT GLYCOSYLATED HEMOGLOBIN (HGB A1C): Hemoglobin A1C: 8.4

## 2013-03-10 LAB — COMPREHENSIVE METABOLIC PANEL
AST: 17 U/L (ref 0–37)
Albumin: 4.1 g/dL (ref 3.5–5.2)
BUN: 20 mg/dL (ref 6–23)
Calcium: 8.8 mg/dL (ref 8.4–10.5)
Chloride: 104 mEq/L (ref 96–112)
Glucose, Bld: 205 mg/dL — ABNORMAL HIGH (ref 70–99)
Potassium: 4.6 mEq/L (ref 3.5–5.3)
Total Bilirubin: 0.4 mg/dL (ref 0.3–1.2)

## 2013-03-10 LAB — CBC
Platelets: 207 10*3/uL (ref 150–400)
RDW: 16.4 % — ABNORMAL HIGH (ref 11.5–15.5)
WBC: 11.6 10*3/uL — ABNORMAL HIGH (ref 4.0–10.5)

## 2013-03-10 MED ORDER — LORAZEPAM 0.5 MG PO TABS: 0.5000 mg | ORAL_TABLET | Freq: Every evening | ORAL | Status: DC | PRN

## 2013-03-10 MED ORDER — ACETAMINOPHEN-CODEINE #4 300-60 MG PO TABS: 1.0000 | ORAL_TABLET | Freq: Four times a day (QID) | ORAL | Status: DC | PRN

## 2013-03-10 NOTE — Patient Instructions (Addendum)
Try the Lorazepam at night.  Go back to taking the whole pill of Bisoprolol.    I have refilled the pain medication.    We'll send you back to [facility] for your knee.

## 2013-03-10 NOTE — Progress Notes (Signed)
Subjective:    [name] is a 72 y.o. male who presents to [facility] today for chronic medical issues:  1.  Anxiety:  Chronic problem but has acutely worsened for patient.  Has been on Xanax before but out of this for quite some time.  Feels Seroquel not helping.  Trouble falling asleep due to anxiety.  Very "jumpy" and causes problems with relationship with wife.   2.  Chronic stable angina:  Takes NTG once every 2 weeks or less.  Pain with moderate but not mild exertion.  Non at rest.  Relieved with NTG.  No dyspnea.  Deconditioning limits ability to exert himself very much  3.  Right shoulder pain:  Chronic frozen shoulder.  Pain worse since cold weather started.   4.  Bilateral knee pain:  Using icy hot, which does help some with pain.  Pain worse after sitting for prolonged periods of time and with prolonged standing.  Some relief with codeine.    5. Diabetes:  Currently on Lantus 60 units in the AM.    No adverse effects from medication.  No hypoglycemic events.  Does have have some neuropathic pains bilaterally, worse at night.  Measures blood sugars at home every:  Day, runs 150 - 200 (2 200's in past 2 months).     Lab Results  Component Value Date   HGBA1C 7.9 12/09/2012   Needs colonoscopy.  Given info to call.   The following portions of the patient's history were reviewed and updated as appropriate: allergies, current medications, past medical history, family and social history, and problem list. Patient is a nonsmoker.    PMH reviewed.  Past Medical History  Diagnosis Date  . Depression   . Diabetes mellitus   . COPD (chronic obstructive pulmonary disease)   . Adhesive capsulitis   . MRSA bacteremia     2011 - possible endocarditis, received 6 weeks IV treatment  . Hyperlipidemia   . Anxiety   . GERD (gastroesophageal reflux disease)   . CAD (coronary artery disease)   . 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 CVA (cerebral infarction)  Questionable history  . Diverticulosis   . Osteomyelitis 2012    Sternoclavicular joint   . Chronic back pain   . Angina   . Myocardial infarction   . CHF (congestive heart failure)   . Shortness of breath   . Sleep apnea   . Chronic kidney disease     hx of BPH  . Neuromuscular disorder     HX of diabetic periferal neuropathy  . Hypertension   . Peripheral vascular disease    Past Surgical History  Procedure Laterality Date  . Septic arthritis      Removal of infected CABG wire by Dr Edwyna Shell - 2011  . Coronary artery bypass graft  1992  . Fracture surgery  1980s    Hip  . Spine surgery  2002    Cervical fusion vertebroplasty C3-4-5  . Coronary stent placement  1999, 2002  . Doppler echocardiography  Sept 2011    EF 50-55% with some impaired diastolic relaxation  . Colonoscopy  2006     Multiple polyps removed, repeat in 5 years  . Right iliopopliteal bypass - 1983  1983  . Femoral-popliteal bypass graft  2008    Left  . Cardiac catheterization  October 18, 2010    Known obstructive disease, no further blockages  . Cardiac catheterization  03/17/2005  EF 35-40%  . US echocardiography  03/04/2006    EF 50-55%  . Amputation  03/16/2011    Procedure: AMPUTATION DIGIT;  Surgeon: Chuck Hint, MD;  Location: [facility] OR;  Service: Vascular;  Laterality: Left;  Great toe    Medications reviewed. Current Outpatient Prescriptions  Medication Sig Dispense Refill  . acetaminophen-codeine (TYLENOL #4) 300-60 MG per tablet Take 1 tablet by mouth every 6 (six) hours as needed for pain.  60 tablet  1  . aspirin (BABY ASPIRIN) 81 MG chewable tablet Chew 81 mg by mouth daily.       . bisoprolol (ZEBETA) 5 MG tablet Take 0.5 tablets (2.5 mg total) by mouth 2 (two) times daily.  90 tablet  3  . insulin NPH (HUMULIN N,NOVOLIN N) 100 UNIT/ML injection Inject 70 Units into the skin 2 (two) times daily.      Marland Kitchen lisinopril (PRINIVIL,ZESTRIL) 10 MG tablet Take 1 tablet (10 mg total) by  mouth daily.  90 tablet  2  . nitroGLYCERIN (NITROSTAT) 0.4 MG SL tablet Place 0.4 mg under the tongue as needed. For chest pains      . omeprazole (PRILOSEC) 40 MG capsule TAKE ONE CAPSULE BY MOUTH EVERY DAY  30 capsule  2  . QUEtiapine (SEROQUEL) 50 MG tablet TAKE ONE TABLET BY MOUTH AT BEDTIME  30 tablet  2  . QUEtiapine (SEROQUEL) 50 MG tablet Take 1 tablet (50 mg total) by mouth at bedtime.  30 tablet  3  . simvastatin (ZOCOR) 80 MG tablet TAKE ONE TABLET BY MOUTH AT BEDTIME  90 tablet  1  . tamsulosin (FLOMAX) 0.4 MG CAPS Take 1 capsule (0.4 mg total) by mouth daily after supper.  90 capsule  1  . torsemide (DEMADEX) 20 MG tablet TAKE TWO TABLETS BY MOUTH EVERY DAY  60 tablet  0  . torsemide (DEMADEX) 20 MG tablet TAKE ONE TABLET BY MOUTH EVERY DAY  60 tablet  2  . venlafaxine XR (EFFEXOR-XR) 37.5 MG 24 hr capsule TAKE ONE CAPSULE BY MOUTH EVERY DAY  30 capsule  2  . warfarin (COUMADIN) 5 MG tablet Take 1-1.5 tablets (5-7.5 mg total) by mouth daily. Take 5mg  every day, except take 7.5mg  on Monday and Friday  60 tablet  3  . zoster vaccine live, PF, (ZOSTAVAX) 16109 UNT/0.65ML injection Inject 19,400 Units into the skin once.  1 each  0   No current facility-administered medications for this visit.    ROS as above otherwise neg.    Objective:   Physical Exam BP 140/56  Pulse 78  Temp(Src) 97.9 F (36.6 C) (Oral)  Ht 5\' 8"  (1.727 m)  Wt 278 lb 14.4 oz (126.508 kg)  BMI 42.42 kg/m2 Gen:  Alert, cooperative patient who appears stated age in no acute distress.  Vital signs reviewed. HEENT: EOMI,  MMM Neck:  Sebaceous cyst noted, stable Cardiac:  Regular rate and rhythm  Pulm:  Clear to auscultation bilaterally with good air movement.     Abd:  OBese and nontender Exts: +1 edema Right leg, +2 edema Left leg.  Chronic venous stasis changes noted.  No ulcers or wounds. MSK:  Tender to palpation medial and lateral joint lines, worse on Right than left.  No redness or effusion noted.   NO joint laxity noted.  Pain limits full physical examination, worst with palpation.  Psych:  Anxious appearing.   No results found for this or any previous visit (from the past 72 hour(s)).

## 2013-03-11 NOTE — Assessment & Plan Note (Signed)
Worsening.  Needs better glucose control. Discussed Gabapentin, wants to hold off for now. Can initiate after starting Ativan rather than both together, anxiety main issue for today.

## 2013-03-11 NOTE — Assessment & Plan Note (Signed)
Needs referral to Pharm clinic Checking labs and LDL today.

## 2013-03-11 NOTE — Assessment & Plan Note (Signed)
Has persistently declined cPAP

## 2013-03-11 NOTE — Assessment & Plan Note (Signed)
Elevated today. Increase back to 5 mg bisoprolol.  (he can't cut the medicine in half anyway he states)  More to help with CHF than BP

## 2013-03-11 NOTE — Assessment & Plan Note (Signed)
Trial of Ativan.  Was on Xanax for years.  Simply haven't refilled this.  Ativan will likely help better with longer duration of action. Low dose FU in 2 weeks to assess for improvement.

## 2013-03-11 NOTE — Assessment & Plan Note (Signed)
In tears due to pain at times Refill codeine  He is open to surgery.  Discussed obtaining radiographs prior to referral, he prefers referral  Refer to Delbert Harness who has seen him in past.

## 2013-03-22 ENCOUNTER — Other Ambulatory Visit: Payer: Self-pay | Admitting: Family Medicine

## 2013-03-28 ENCOUNTER — Ambulatory Visit: Payer: Medicare Other

## 2013-03-28 ENCOUNTER — Telehealth: Payer: Self-pay | Admitting: Family Medicine

## 2013-03-28 MED ORDER — GLUCOSE BLOOD VI STRP: ORAL_STRIP | Status: DC

## 2013-03-28 MED ORDER — ONETOUCH ULTRA 2 W/DEVICE KIT: PACK | Status: DC

## 2013-03-28 NOTE — Telephone Encounter (Signed)
Pt's wife dropped off paperwork to be filled out concerning strips and machine, pt wife would like for it to be faxed to pharmacy

## 2013-03-28 NOTE — Telephone Encounter (Signed)
Forms were placed in Dr Mingo Amber box to be completed. [name], [name]

## 2013-03-28 NOTE — Telephone Encounter (Signed)
Forms given to [facility] after completion

## 2013-03-30 ENCOUNTER — Ambulatory Visit (INDEPENDENT_AMBULATORY_CARE_PROVIDER_SITE_OTHER): Payer: Medicare Other | Admitting: *Deleted

## 2013-03-30 DIAGNOSIS — Z7901 Long term (current) use of anticoagulants: Secondary | ICD-10-CM

## 2013-03-30 DIAGNOSIS — Z8672 Personal history of thrombophlebitis: Secondary | ICD-10-CM

## 2013-03-30 LAB — POCT INR: INR: 2.5

## 2013-04-21 ENCOUNTER — Other Ambulatory Visit: Payer: Self-pay | Admitting: Family Medicine

## 2013-04-27 ENCOUNTER — Ambulatory Visit: Payer: Medicare Other

## 2013-04-29 ENCOUNTER — Encounter (HOSPITAL_COMMUNITY): Payer: Self-pay | Admitting: Emergency Medicine

## 2013-04-29 ENCOUNTER — Emergency Department (HOSPITAL_COMMUNITY): Payer: Medicare Other

## 2013-04-29 ENCOUNTER — Inpatient Hospital Stay (HOSPITAL_COMMUNITY)
Admission: EM | Admit: 2013-04-29 | Discharge: 2013-05-02 | DRG: 191 | Disposition: A | Discharge status: Home Health | Payer: Medicare Other | Attending: Family Medicine | Admitting: Family Medicine

## 2013-04-29 DIAGNOSIS — G8929 Other chronic pain: Secondary | ICD-10-CM | POA: Diagnosis present

## 2013-04-29 DIAGNOSIS — J449 Chronic obstructive pulmonary disease, unspecified: Secondary | ICD-10-CM

## 2013-04-29 DIAGNOSIS — Z7982 Long term (current) use of aspirin: Secondary | ICD-10-CM

## 2013-04-29 DIAGNOSIS — D696 Thrombocytopenia, unspecified: Secondary | ICD-10-CM | POA: Diagnosis present

## 2013-04-29 DIAGNOSIS — Z794 Long term (current) use of insulin: Secondary | ICD-10-CM

## 2013-04-29 DIAGNOSIS — E1149 Type 2 diabetes mellitus with other diabetic neurological complication: Secondary | ICD-10-CM | POA: Diagnosis present

## 2013-04-29 DIAGNOSIS — E1142 Type 2 diabetes mellitus with diabetic polyneuropathy: Secondary | ICD-10-CM | POA: Diagnosis present

## 2013-04-29 DIAGNOSIS — E785 Hyperlipidemia, unspecified: Secondary | ICD-10-CM | POA: Diagnosis present

## 2013-04-29 DIAGNOSIS — K219 Gastro-esophageal reflux disease without esophagitis: Secondary | ICD-10-CM | POA: Diagnosis present

## 2013-04-29 DIAGNOSIS — R5381 Other malaise: Secondary | ICD-10-CM | POA: Diagnosis present

## 2013-04-29 DIAGNOSIS — I5022 Chronic systolic (congestive) heart failure: Secondary | ICD-10-CM

## 2013-04-29 DIAGNOSIS — R600 Localized edema: Secondary | ICD-10-CM

## 2013-04-29 DIAGNOSIS — F329 Major depressive disorder, single episode, unspecified: Secondary | ICD-10-CM

## 2013-04-29 DIAGNOSIS — E860 Dehydration: Secondary | ICD-10-CM | POA: Diagnosis present

## 2013-04-29 DIAGNOSIS — G4730 Sleep apnea, unspecified: Secondary | ICD-10-CM

## 2013-04-29 DIAGNOSIS — Z8673 Personal history of transient ischemic attack (TIA), and cerebral infarction without residual deficits: Secondary | ICD-10-CM

## 2013-04-29 DIAGNOSIS — R5383 Other fatigue: Secondary | ICD-10-CM

## 2013-04-29 DIAGNOSIS — Z9181 History of falling: Secondary | ICD-10-CM

## 2013-04-29 DIAGNOSIS — Z951 Presence of aortocoronary bypass graft: Secondary | ICD-10-CM

## 2013-04-29 DIAGNOSIS — I509 Heart failure, unspecified: Secondary | ICD-10-CM | POA: Diagnosis present

## 2013-04-29 DIAGNOSIS — Z87891 Personal history of nicotine dependence: Secondary | ICD-10-CM

## 2013-04-29 DIAGNOSIS — I5020 Unspecified systolic (congestive) heart failure: Secondary | ICD-10-CM | POA: Diagnosis present

## 2013-04-29 DIAGNOSIS — I2589 Other forms of chronic ischemic heart disease: Secondary | ICD-10-CM | POA: Diagnosis present

## 2013-04-29 DIAGNOSIS — I739 Peripheral vascular disease, unspecified: Secondary | ICD-10-CM | POA: Diagnosis present

## 2013-04-29 DIAGNOSIS — J441 Chronic obstructive pulmonary disease with (acute) exacerbation: Principal | ICD-10-CM | POA: Diagnosis present

## 2013-04-29 DIAGNOSIS — S68118A Complete traumatic metacarpophalangeal amputation of other finger, initial encounter: Secondary | ICD-10-CM

## 2013-04-29 DIAGNOSIS — R531 Weakness: Secondary | ICD-10-CM

## 2013-04-29 DIAGNOSIS — N400 Enlarged prostate without lower urinary tract symptoms: Secondary | ICD-10-CM | POA: Diagnosis present

## 2013-04-29 DIAGNOSIS — R197 Diarrhea, unspecified: Secondary | ICD-10-CM

## 2013-04-29 DIAGNOSIS — Z86711 Personal history of pulmonary embolism: Secondary | ICD-10-CM

## 2013-04-29 DIAGNOSIS — M1990 Unspecified osteoarthritis, unspecified site: Secondary | ICD-10-CM | POA: Diagnosis present

## 2013-04-29 DIAGNOSIS — I129 Hypertensive chronic kidney disease with stage 1 through stage 4 chronic kidney disease, or unspecified chronic kidney disease: Secondary | ICD-10-CM | POA: Diagnosis present

## 2013-04-29 DIAGNOSIS — M549 Dorsalgia, unspecified: Secondary | ICD-10-CM | POA: Diagnosis present

## 2013-04-29 DIAGNOSIS — IMO0002 Reserved for concepts with insufficient information to code with codable children: Secondary | ICD-10-CM

## 2013-04-29 DIAGNOSIS — F3289 Other specified depressive episodes: Secondary | ICD-10-CM | POA: Diagnosis present

## 2013-04-29 DIAGNOSIS — I2510 Atherosclerotic heart disease of native coronary artery without angina pectoris: Secondary | ICD-10-CM | POA: Diagnosis present

## 2013-04-29 DIAGNOSIS — E1165 Type 2 diabetes mellitus with hyperglycemia: Secondary | ICD-10-CM

## 2013-04-29 DIAGNOSIS — Z96659 Presence of unspecified artificial knee joint: Secondary | ICD-10-CM

## 2013-04-29 DIAGNOSIS — N189 Chronic kidney disease, unspecified: Secondary | ICD-10-CM | POA: Diagnosis present

## 2013-04-29 DIAGNOSIS — Z7901 Long term (current) use of anticoagulants: Secondary | ICD-10-CM

## 2013-04-29 DIAGNOSIS — F411 Generalized anxiety disorder: Secondary | ICD-10-CM

## 2013-04-29 DIAGNOSIS — I252 Old myocardial infarction: Secondary | ICD-10-CM

## 2013-04-29 LAB — BASIC METABOLIC PANEL
BUN: 14 mg/dL (ref 6–23)
CHLORIDE: 97 meq/L (ref 96–112)
CO2: 23 meq/L (ref 19–32)
Calcium: 8.6 mg/dL (ref 8.4–10.5)
Creatinine, Ser: 0.85 mg/dL (ref 0.50–1.35)
GFR calc Af Amer: >90 mL/min (ref >90)
GFR calc non Af Amer: 84 mL/min — ABNORMAL LOW (ref >90)
Glucose, Bld: 246 mg/dL — ABNORMAL HIGH (ref 70–99)
Potassium: 4.5 mEq/L (ref 3.7–5.3)
Sodium: 134 mEq/L — ABNORMAL LOW (ref 137–147)

## 2013-04-29 LAB — CBC WITH DIFFERENTIAL/PLATELET
Basophils Absolute: 0 10*3/uL (ref 0.0–0.1)
Basophils Relative: 0 % (ref 0–1)
EOS ABS: 0 10*3/uL (ref 0.0–0.7)
Eosinophils Relative: 0 % (ref 0–5)
HEMATOCRIT: 41.4 % (ref 39.0–52.0)
Hemoglobin: 13.9 g/dL (ref 13.0–17.0)
LYMPHS ABS: 0.4 10*3/uL — AB (ref 0.7–4.0)
LYMPHS PCT: 5 % — AB (ref 12–46)
MCH: 27.5 pg (ref 26.0–34.0)
MCHC: 33.6 g/dL (ref 30.0–36.0)
MCV: 81.8 fL (ref 78.0–100.0)
MONO ABS: 0.9 10*3/uL (ref 0.1–1.0)
Monocytes Relative: 12 % (ref 3–12)
Neutro Abs: 6.5 10*3/uL (ref 1.7–7.7)
Neutrophils Relative %: 83 % — ABNORMAL HIGH (ref 43–77)
Platelets: 139 10*3/uL — ABNORMAL LOW (ref 150–400)
RBC: 5.06 MIL/uL (ref 4.22–5.81)
RDW: 16.9 % — AB (ref 11.5–15.5)
WBC: 7.8 10*3/uL (ref 4.0–10.5)

## 2013-04-29 LAB — CG4 I-STAT (LACTIC ACID): Lactic Acid, Venous: 1.63 mmol/L (ref 0.5–2.2)

## 2013-04-29 LAB — PROTIME-INR
INR: 1.82 — AB (ref 0.00–1.49)
Prothrombin Time: 20.5 seconds — ABNORMAL HIGH (ref 11.6–15.2)

## 2013-04-29 LAB — POCT I-STAT TROPONIN I: Troponin i, poc: 0.01 ng/mL (ref 0.00–0.08)

## 2013-04-29 LAB — GLUCOSE, CAPILLARY: Glucose-Capillary: 207 mg/dL — ABNORMAL HIGH (ref 70–99)

## 2013-04-29 NOTE — ED Notes (Signed)
Pt placed on 2L [place]; now sating 94%.

## 2013-04-29 NOTE — ED Notes (Signed)
500cc bolus of NS started.

## 2013-04-29 NOTE — ED Notes (Signed)
Second 500cc bolus started per Dr. Sabra Heck due to BP 98/41.

## 2013-04-29 NOTE — ED Notes (Signed)
Pt from home with c/o increased weakness.  Pt is normally ambulatory at home with a walker and has not had problems using walker today.  Pt reports being sick with a cold for the past 3-4 days. Pt reporting back and leg pain; denies chest pain or shortness of breath.  Pt reports some diarrhea; denies n/v.  Pain 6/10.

## 2013-04-29 NOTE — ED Notes (Signed)
Did POCT CBG 207

## 2013-04-29 NOTE — ED Provider Notes (Signed)
CSN: [id number]     Arrival date & time 04/29/13  2214 History   First MD Initiated Contact with Patient 04/29/13 2302     Chief Complaint  Patient presents with  . Weakness   (Consider location/radiation/quality/duration/timing/severity/associated sxs/prior Treatment) HPI Comments: 73 year old male, history of COPD, diabetes, history of heart disease, history of hypertension and pulmonary embolism on Coumadin.  He presents with a complaint of generalized weakness which he states started today. It has been persistent, gradually worsening and has rendered him unable to get out of bed. His family members have been unable to help him out of bed either. He feels too weak to stand, he states that both of his legs don't work, his arms have no strength to lift him and he explains that he has a very dry mouth. He definitively denies focalizing weakness but states it is generalized.  His wife states he has had a recent episode of which she thought was bronchitis because of persistent coughing, he has had no appetite today, describes approximately 7 bowel movements though he describes them as formed and small. There has been no watery or loose or bloody diarrhea, no dysuria, no abdominal pain, no chest pain, no back pain, no headache. The symptoms are persistent, nothing seems to make it better or worse.  The history is provided by the patient and the spouse.    Past Medical History  Diagnosis Date  . Depression   . Diabetes mellitus   . COPD (chronic obstructive pulmonary disease)   . Adhesive capsulitis   . MRSA bacteremia     2011 - possible endocarditis, received 6 weeks IV treatment  . Hyperlipidemia   . Anxiety   . GERD (gastroesophageal reflux disease)   . CAD (coronary artery disease)   . 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 CVA (cerebral infarction) Questionable history  . Diverticulosis   . Osteomyelitis 2012    Sternoclavicular joint   . Chronic back pain    . Angina   . Myocardial infarction   . CHF (congestive heart failure)   . Shortness of breath   . Sleep apnea   . Chronic kidney disease     hx of BPH  . Neuromuscular disorder     HX of diabetic periferal neuropathy  . Hypertension   . Peripheral vascular disease    Past Surgical History  Procedure Laterality Date  . Septic arthritis      Removal of infected CABG wire by Dr Arlyce Dice - 2011  . Coronary artery bypass graft  1992  . Fracture surgery  1980s    Hip  . Spine surgery  2002    Cervical fusion vertebroplasty C3-4-5  . Coronary stent placement  1999, 2002  . Doppler echocardiography  Sept 2011    EF 50-55% with some impaired diastolic relaxation  . Colonoscopy  2006     Multiple polyps removed, repeat in 5 years  . Right iliopopliteal bypass - [place]  . Femoral-popliteal bypass graft  2008    Left  . Cardiac catheterization  October 18, 2010    Known obstructive disease, no further blockages  . Cardiac catheterization  03/17/2005    EF 35-40%  . US echocardiography  03/04/2006    EF 50-55%  . Amputation  03/16/2011    Procedure: AMPUTATION DIGIT;  Surgeon: Angelia Mould, MD;  Location: [facility] OR;  Service: Vascular;  Laterality: Left;  Great toe   Family History  Problem Relation Age of Onset  . Heart disease Father    History  Substance Use Topics  . Smoking status: Former Smoker    Quit date: 06/19/2000  . Smokeless tobacco: Never Used  . Alcohol Use: No    Review of Systems  All other systems reviewed and are negative.    Allergies  Vancomycin and Diazepam  Home Medications   Current Outpatient Rx  Name  Route  Sig  Dispense  Refill  . acetaminophen-codeine (TYLENOL #4) 300-60 MG per tablet   Oral   Take 1 tablet by mouth every 6 (six) hours as needed.   60 tablet   1   . aspirin (BABY ASPIRIN) 81 MG chewable tablet   Oral   Chew 81 mg by mouth daily.          . bisoprolol (ZEBETA) 5 MG tablet   Oral   Take 0.5 tablets (2.5  mg total) by mouth 2 (two) times daily.   90 tablet   3   . insulin NPH (HUMULIN N,NOVOLIN N) 100 UNIT/ML injection   Subcutaneous   Inject 60 Units into the skin daily before breakfast.          . lisinopril (PRINIVIL,ZESTRIL) 10 MG tablet   Oral   Take 1 tablet (10 mg total) by mouth daily.   90 tablet   2   . LORazepam (ATIVAN) 0.5 MG tablet   Oral   Take 1 tablet (0.5 mg total) by mouth at bedtime as needed for anxiety.   30 tablet   1   . omeprazole (PRILOSEC) 40 MG capsule      TAKE ONE CAPSULE BY MOUTH ONCE DAILY   30 capsule   3   . predniSONE (DELTASONE) 5 MG tablet   Oral   Take 5 mg by mouth daily with breakfast.         . QUEtiapine (SEROQUEL) 50 MG tablet   Oral   Take 1 tablet (50 mg total) by mouth at bedtime.   30 tablet   3   . simvastatin (ZOCOR) 80 MG tablet      TAKE ONE TABLET BY MOUTH AT BEDTIME   90 tablet   1   . tamsulosin (FLOMAX) 0.4 MG CAPS   Oral   Take 1 capsule (0.4 mg total) by mouth daily after supper.   90 capsule   1   . torsemide (DEMADEX) 20 MG tablet      TAKE ONE TABLET BY MOUTH EVERY DAY   60 tablet   2   . venlafaxine XR (EFFEXOR-XR) 37.5 MG 24 hr capsule      TAKE ONE CAPSULE BY MOUTH ONCE DAILY   30 capsule   3   . warfarin (COUMADIN) 5 MG tablet   Oral   Take 5-7.5 mg by mouth daily. Take 1 tablet (19m) daily except Sunday then take 1 1/2 tablets (7.551m         . Blood Glucose Monitoring Suppl (ONE TOUCH ULTRA 2) W/DEVICE KIT      Please supply patient with 1 glucometer device to use 4 times daily to check blood sugars.   1 each   1   . glucose blood test strip      Use as instructed   100 each   12   . nitroGLYCERIN (NITROSTAT) 0.4 MG SL tablet   Sublingual   Place 0.4 mg under the tongue as needed. For chest pains         .  zoster vaccine live, PF, (ZOSTAVAX) 01093 UNT/0.65ML injection   Subcutaneous   Inject 19,400 Units into the skin once.   1 each   0    BP 130/72  Pulse  70  Temp(Src) 99 F (37.2 C) (Oral)  Resp 26  SpO2 96% Physical Exam  Nursing note and vitals reviewed. Constitutional: He appears well-developed and well-nourished. No distress.  HENT:  Head: Normocephalic and atraumatic.  Mucous membranes are dehydrated  Eyes: Conjunctivae and EOM are normal. Pupils are equal, round, and reactive to light. Right eye exhibits no discharge. Left eye exhibits no discharge. No scleral icterus.  Neck: Normal range of motion. Neck supple. No JVD present. No thyromegaly present.  Cardiovascular: Normal rate, regular rhythm and intact distal pulses.  Exam reveals no gallop and no friction rub.   Murmur ( Systolic) heard. Soft heart sounds. Palpable pulses at the radial arteries and the dorsalis pedis bilaterally  Pulmonary/Chest: No respiratory distress. He has wheezes ( Mild end expiratory wheezing). He has no rales.  Slight tachypnea, no accessory muscle use, speaks in full sentences  Abdominal: Soft. Bowel sounds are normal. He exhibits no distension and no mass. There is no tenderness.  Musculoskeletal: Normal range of motion. He exhibits no edema and no tenderness.  Lymphadenopathy:    He has no cervical adenopathy.  Neurological: He is alert. Coordination normal.  No pronator drift, no facial droop, cranial nerves III through XII intact, strength is 5 out of 5 in the bilateral grips, he is able to lift his bilateral legs off the bed several inches but cannot hold them for long period he has hyperesthesias to his bilateral lower extremities, normal coordination of the upper extremities, speech is clear and memory is intact  Skin: Skin is warm and dry. No rash noted. No erythema.  Psychiatric: He has a normal mood and affect. His behavior is normal.    ED Course  Procedures (including critical care time) Labs Review Labs Reviewed  BASIC METABOLIC PANEL - Abnormal; Notable for the following:    Sodium 134 (*)    Glucose, Bld 246 (*)    GFR calc non Af  Amer 84 (*)    All other components within normal limits  CBC WITH DIFFERENTIAL - Abnormal; Notable for the following:    RDW 16.9 (*)    Platelets 139 (*)    Neutrophils Relative % 83 (*)    Lymphocytes Relative 5 (*)    Lymphs Abs 0.4 (*)    All other components within normal limits  URINALYSIS, ROUTINE W REFLEX MICROSCOPIC - Abnormal; Notable for the following:    APPearance CLOUDY (*)    Glucose, UA 500 (*)    Protein, ur 30 (*)    All other components within normal limits  PROTIME-INR - Abnormal; Notable for the following:    Prothrombin Time 20.5 (*)    INR 1.82 (*)    All other components within normal limits  GLUCOSE, CAPILLARY - Abnormal; Notable for the following:    Glucose-Capillary 207 (*)    All other components within normal limits  URINE MICROSCOPIC-ADD ON  POCT I-STAT TROPONIN I  CG4 I-STAT (LACTIC ACID)   Imaging Review Ct Head Wo Contrast  04/30/2013   CLINICAL DATA:  73 year old male with pain, diarrhea, weakness. Initial encounter.  EXAM: CT HEAD WITHOUT CONTRAST  TECHNIQUE: Contiguous axial images were obtained from the base of the skull through the vertex without intravenous contrast.  COMPARISON:  None.  FINDINGS: No acute orbit  or scalp soft tissue findings. Mild mucoperiosteal thickening throughout the paranasal sinuses. Right frontal sinus osteoma. Tympanic cavities and mastoids are clear. No acute osseous abnormality identified.  Calcified atherosclerosis at the skull base. Confluent hypodensity in the left MCA and PCA territories with mild ex vacuo changes to the body and atrium of the left lateral ventricle. No associated mass effect. Areas of cortical encephalomalacia, will also with some areas of cortical sparing.  Right hemisphere and posterior fossa gray-white matter differentiation remains normal. No ventriculomegaly. No acute intracranial hemorrhage identified. Dural calcifications. No midline shift, mass effect, or evidence of intracranial mass lesion.  There is perhaps mildly asymmetric density of the left MCA M1 segment (series 2, images 11 and 12).  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 No acute intracranial abnormality.  Study discussed by telephone with Dr. Noemi Chapel on 04/30/2013 at 01:07. He advises that the patient presents tonight with generalized weakness and reports "questionable prior stroke" .   Electronically Signed   By: Lars Pinks M.D.   On: 04/30/2013 01:08   Dg Chest Portable 1 View  04/29/2013   CLINICAL DATA:  73 year old male with weakness and congestion.  EXAM: PORTABLE CHEST - 1 VIEW  COMPARISON:  05/24/2011 and prior chest radiographs dating back to 06/20/2006  FINDINGS: Mild cardiomegaly and CABG changes again noted.  Peribronchial thickening is again noted.  There is no evidence of focal airspace disease, pulmonary edema, suspicious pulmonary nodule/mass, pleural effusion, or pneumothorax. No acute bony abnormalities are identified.  Cervical surgical changes are again noted.  IMPRESSION: Mild cardiomegaly without evidence of acute cardiopulmonary disease.   Electronically Signed   By: Hassan Rowan M.D.   On: 04/29/2013 23:14    ED ECG REPORT  I personally interpreted this EKG   Date: 04/30/2013   Rate: 96  Rhythm: normal sinus rhythm  QRS Axis: normal  Intervals: QRS prologned  ST/T Wave abnormalities: normal  Conduction Disutrbances:nonspecific intraventricular conduction delay  Narrative Interpretation:   Old EKG Reviewed: unchanged   MDM   1. Generalized weakness    The patient has a persistent and worsening generalized weakness. He is barely able to sit up in the bed and does takes significant assistance. He was hypotensive for some time, this responded to IV fluids which he is currently receiving, he has no fever or tachycardia, laboratory workup is pending, chest x-ray shows no acute findings, CBC without acute findings other than mild thrombocytopenia. We'll obtain  a urinalysis.  Does not appear to be focalizing or lateralizing to suggest a stroke like syndrome.  CT scan negative, labs unremarkable, discussed care with the resident of the family practice service who will admit. The patient is unable to ambulate due to generalized weakness  Johnna Acosta, MD 04/30/13 [phone]

## 2013-04-29 NOTE — ED Provider Notes (Signed)
MSE was initiated and I personally evaluated the patient and placed orders (if any) at  10:49 PM on April 29, 2013.  The patient appears stable so that the remainder of the MSE may be completed by another provider.  Protocols ordered.  Pt and wife updated.    Elmer Sow, MD 04/29/13 2250

## 2013-04-30 ENCOUNTER — Emergency Department (HOSPITAL_COMMUNITY): Payer: Medicare Other

## 2013-04-30 DIAGNOSIS — R531 Weakness: Secondary | ICD-10-CM | POA: Diagnosis present

## 2013-04-30 DIAGNOSIS — IMO0001 Reserved for inherently not codable concepts without codable children: Secondary | ICD-10-CM

## 2013-04-30 DIAGNOSIS — E1165 Type 2 diabetes mellitus with hyperglycemia: Secondary | ICD-10-CM

## 2013-04-30 DIAGNOSIS — R197 Diarrhea, unspecified: Secondary | ICD-10-CM

## 2013-04-30 LAB — URINALYSIS, ROUTINE W REFLEX MICROSCOPIC
BILIRUBIN URINE: NEGATIVE
GLUCOSE, UA: 500 mg/dL — AB
Hgb urine dipstick: NEGATIVE
KETONES UR: NEGATIVE mg/dL
LEUKOCYTES UA: NEGATIVE
NITRITE: NEGATIVE
PH: 6.5 (ref 5.0–8.0)
Protein, ur: 30 mg/dL — AB
SPECIFIC GRAVITY, URINE: 1.025 (ref 1.005–1.030)
Urobilinogen, UA: 1 mg/dL (ref 0.0–1.0)

## 2013-04-30 LAB — HEMOGLOBIN A1C
Hgb A1c MFr Bld: 9.6 % — ABNORMAL HIGH (ref <5.7)
Mean Plasma Glucose: 229 mg/dL — ABNORMAL HIGH (ref <117)

## 2013-04-30 LAB — CBC
HEMATOCRIT: 39.8 % (ref 39.0–52.0)
HEMOGLOBIN: 13.1 g/dL (ref 13.0–17.0)
MCH: 27.1 pg (ref 26.0–34.0)
MCHC: 32.9 g/dL (ref 30.0–36.0)
MCV: 82.2 fL (ref 78.0–100.0)
Platelets: 129 10*3/uL — ABNORMAL LOW (ref 150–400)
RBC: 4.84 MIL/uL (ref 4.22–5.81)
RDW: 17 % — ABNORMAL HIGH (ref 11.5–15.5)
WBC: 6.8 10*3/uL (ref 4.0–10.5)

## 2013-04-30 LAB — BASIC METABOLIC PANEL
BUN: 14 mg/dL (ref 6–23)
CHLORIDE: 101 meq/L (ref 96–112)
CO2: 24 mEq/L (ref 19–32)
Calcium: 8.1 mg/dL — ABNORMAL LOW (ref 8.4–10.5)
Creatinine, Ser: 0.91 mg/dL (ref 0.50–1.35)
GFR calc Af Amer: >90 mL/min (ref >90)
GFR calc non Af Amer: 81 mL/min — ABNORMAL LOW (ref >90)
Glucose, Bld: 216 mg/dL — ABNORMAL HIGH (ref 70–99)
POTASSIUM: 4.6 meq/L (ref 3.7–5.3)
Sodium: 137 mEq/L (ref 137–147)

## 2013-04-30 LAB — GLUCOSE, CAPILLARY
GLUCOSE-CAPILLARY: 210 mg/dL — AB (ref 70–99)
GLUCOSE-CAPILLARY: 233 mg/dL — AB (ref 70–99)
Glucose-Capillary: 321 mg/dL — ABNORMAL HIGH (ref 70–99)
Glucose-Capillary: 285 mg/dL — ABNORMAL HIGH (ref 70–99)

## 2013-04-30 LAB — URINE MICROSCOPIC-ADD ON

## 2013-04-30 LAB — TROPONIN I

## 2013-04-30 MED ORDER — IPRATROPIUM-ALBUTEROL 0.5-2.5 (3) MG/3ML IN SOLN
3.0000 mL | Freq: Three times a day (TID) | RESPIRATORY_TRACT | Status: DC
  Administered 2013-05-01 (×3): 3 mL via RESPIRATORY_TRACT
  Filled 2013-04-30 (×4): qty 3

## 2013-04-30 MED ORDER — INSULIN ASPART 100 UNIT/ML ~~LOC~~ SOLN
0.0000 [IU] | Freq: Three times a day (TID) | SUBCUTANEOUS | Status: DC
  Administered 2013-04-30: 5 [IU] via SUBCUTANEOUS
  Administered 2013-04-30: 11 [IU] via SUBCUTANEOUS
  Administered 2013-04-30: 5 [IU] via SUBCUTANEOUS
  Administered 2013-05-01: 8 [IU] via SUBCUTANEOUS
  Administered 2013-05-01: 15 [IU] via SUBCUTANEOUS
  Administered 2013-05-01: 11 [IU] via SUBCUTANEOUS

## 2013-04-30 MED ORDER — SODIUM CHLORIDE 0.9 % IJ SOLN
3.0000 mL | Freq: Two times a day (BID) | INTRAMUSCULAR | Status: DC
  Administered 2013-04-30 – 2013-05-02 (×5): 3 mL via INTRAVENOUS

## 2013-04-30 MED ORDER — QUETIAPINE FUMARATE 50 MG PO TABS
50.0000 mg | ORAL_TABLET | Freq: Every day | ORAL | Status: DC
  Administered 2013-04-30 – 2013-05-01 (×2): 50 mg via ORAL
  Filled 2013-04-30 (×3): qty 1

## 2013-04-30 MED ORDER — VENLAFAXINE HCL ER 37.5 MG PO CP24
37.5000 mg | ORAL_CAPSULE | Freq: Every day | ORAL | Status: DC
  Administered 2013-04-30 – 2013-05-02 (×3): 37.5 mg via ORAL
  Filled 2013-04-30 (×4): qty 1

## 2013-04-30 MED ORDER — HYDROCODONE-ACETAMINOPHEN 5-325 MG PO TABS
1.0000 | ORAL_TABLET | ORAL | Status: DC | PRN
  Administered 2013-04-30: 1 via ORAL
  Administered 2013-05-01 – 2013-05-02 (×5): 2 via ORAL
  Filled 2013-04-30 (×5): qty 2
  Filled 2013-04-30: qty 1

## 2013-04-30 MED ORDER — ONDANSETRON HCL 4 MG/2ML IJ SOLN: 4.0000 mg | Freq: Four times a day (QID) | INTRAMUSCULAR | Status: DC | PRN

## 2013-04-30 MED ORDER — SODIUM CHLORIDE 0.9 % IV BOLUS (SEPSIS): 500.0000 mL | Freq: Once | INTRAVENOUS | Status: DC

## 2013-04-30 MED ORDER — SODIUM CHLORIDE 0.9 % IV SOLN
INTRAVENOUS | Status: AC
  Administered 2013-04-30: 05:00:00 via INTRAVENOUS

## 2013-04-30 MED ORDER — PANTOPRAZOLE SODIUM 40 MG PO TBEC
40.0000 mg | DELAYED_RELEASE_TABLET | Freq: Every day | ORAL | Status: DC
Start: 2013-04-30 — End: 2013-05-02
  Administered 2013-04-30 – 2013-05-02 (×3): 40 mg via ORAL
  Filled 2013-04-30 (×2): qty 1

## 2013-04-30 MED ORDER — LORAZEPAM 0.5 MG PO TABS
0.5000 mg | ORAL_TABLET | Freq: Every evening | ORAL | Status: DC | PRN
  Administered 2013-05-01: 0.5 mg via ORAL
  Filled 2013-04-30: qty 1

## 2013-04-30 MED ORDER — ACETAMINOPHEN 650 MG RE SUPP: 650.0000 mg | Freq: Four times a day (QID) | RECTAL | Status: DC | PRN

## 2013-04-30 MED ORDER — ASPIRIN EC 81 MG PO TBEC
81.0000 mg | DELAYED_RELEASE_TABLET | Freq: Every day | ORAL | Status: DC
  Administered 2013-04-30 – 2013-05-02 (×3): 81 mg via ORAL
  Filled 2013-04-30 (×3): qty 1

## 2013-04-30 MED ORDER — TAMSULOSIN HCL 0.4 MG PO CAPS
0.4000 mg | ORAL_CAPSULE | Freq: Every day | ORAL | Status: DC
  Administered 2013-04-30 – 2013-05-02 (×3): 0.4 mg via ORAL
  Filled 2013-04-30 (×3): qty 1

## 2013-04-30 MED ORDER — ACETAMINOPHEN 325 MG PO TABS
650.0000 mg | ORAL_TABLET | Freq: Four times a day (QID) | ORAL | Status: DC | PRN
  Administered 2013-04-30: 650 mg via ORAL
  Filled 2013-04-30: qty 2

## 2013-04-30 MED ORDER — IPRATROPIUM-ALBUTEROL 0.5-2.5 (3) MG/3ML IN SOLN
3.0000 mL | Freq: Four times a day (QID) | RESPIRATORY_TRACT | Status: DC
  Administered 2013-04-30 (×3): 3 mL via RESPIRATORY_TRACT
  Filled 2013-04-30 (×3): qty 3

## 2013-04-30 MED ORDER — ATORVASTATIN CALCIUM 40 MG PO TABS
40.0000 mg | ORAL_TABLET | Freq: Every day | ORAL | Status: DC
Start: 2013-04-30 — End: 2013-05-02
  Administered 2013-04-30 – 2013-05-02 (×3): 40 mg via ORAL
  Filled 2013-04-30 (×3): qty 1

## 2013-04-30 MED ORDER — WARFARIN - PHARMACIST DOSING INPATIENT
Freq: Every day | Status: DC
  Administered 2013-05-01 – 2013-05-02 (×2)

## 2013-04-30 MED ORDER — PREDNISONE 50 MG PO TABS
50.0000 mg | ORAL_TABLET | Freq: Every day | ORAL | Status: DC
  Administered 2013-04-30 – 2013-05-02 (×3): 50 mg via ORAL
  Filled 2013-04-30 (×4): qty 1

## 2013-04-30 MED ORDER — ONDANSETRON HCL 4 MG PO TABS: 4.0000 mg | ORAL_TABLET | Freq: Four times a day (QID) | ORAL | Status: DC | PRN

## 2013-04-30 MED ORDER — ALBUTEROL SULFATE (2.5 MG/3ML) 0.083% IN NEBU
5.0000 mg | INHALATION_SOLUTION | RESPIRATORY_TRACT | Status: DC | PRN
  Administered 2013-04-30: 2.5 mg via RESPIRATORY_TRACT
  Filled 2013-04-30: qty 6

## 2013-04-30 MED ORDER — WARFARIN SODIUM 7.5 MG PO TABS
7.5000 mg | ORAL_TABLET | Freq: Once | ORAL | Status: AC
  Administered 2013-04-30: 7.5 mg via ORAL
  Filled 2013-04-30: qty 1

## 2013-04-30 NOTE — H&P (Signed)
[facility] Admission History and Physical Service Pager: [phone]  Patient name: [name] Medical record number: [id number] Date of birth: May 18, 1940 Age: 73 y.o. Gender: male  Primary Care Provider: [name], MD Consultants: None Code Status: Full Code  Chief Complaint: Generalized weakness  Assessment and Plan: [name] is a 73 y.o. male presenting with generalized weakness, cough, SOB. PMH is significant for CAD, COPD, HTN, DM-2 (uncontrolled) with peripheral neuropathy, HLD, Systolic CHF, Hx of PE (on Warfarin), and CVA.  Generalized weakness - Patient with no focal neurological deficits on exam.  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 Unclear etiology at this time.  Patient does appear to be dry on PE with recent diarrhea.  Volume depletion could be contributing to his symptomatology. - Will admit to Telemetry - IVF's as below - PT/OT consults - Will consider MRI; however, with no focal deficits currently will not obtain MRI at this time.  - Laboratory work up unremarkable with negative EKG, UA, Chest xray, and CBC/BMP.   Acute COPD exacerbation - Patient requiring supplemental O2 in the ED; With significant cough and diffuse wheezing on PE, will treat as exacerbation. - Prednisone 50 mg daily - Duonebs Q6, Albuterol Q2 PRN - No antibiotics at this time as chest xray was negative. - Patient with documented COPD with no home Albuterol or Inhaled Corticosteroid.  Will likely need to be discharged on Spiriva vs. Dulera  DM-2, uncontrolled - A1C - SSI moderate - Patient on home NPH, will likely need to start in the AM.  HLD  - Will continue Statin  HTN - Patient's BP's were initially low in the ED. - Currently BP's are well controlled (130's/60's). - Will hold antihypertensives currently especially in the setting of volume depletion.  Hx of PE - Coumadin Per  Pharmacy  Hx of CAD; Systolic CHF - No current chest pain; EKG unremarkable (see below). - Continuing ASA 81 mg daily. - Holding antihypertensives as above. - Last Echo was in 2011 and revealed EF of 45-50%.  Will obtain Echo during hospitalization. - Currently giving fluids as below.  Will be cautious given CHF history.  FEN/GI: Carb modified diet. NS bolus (500 mL) followed by NS @ 75 mL/hr Prophylaxis: Coumadin per pharmacy.  Disposition: Telemetry - Pending further workup and clinical improvement.   History of Present Illness:  [name] is a 73 y.o. male with an extensive PMH including CAD, COPD, HTN, DM-2 (uncontrolled) with peripheral neuropathy, HLD, Systolic CHF, Hx of PE (on Warfarin), and CVA who presents with generalized weakness.  Patient reports that for the past 2 days he has had generalized weakness and has been experiencing pain in his lower extremities (he has a known history of OA).  It has slowly worsened and earlier today, he was unable to get out of bed.  He reports that he generally has not been feeling well.  In addition to the weakness, he has diarrhea x 7 and has decreased PO intake.  He has also had a non-productive cough.  No recent fall or trauma.  He reports that he has had some SOB and notes that his "breathing has been bad for years."  He denies recent chest pain, abdominal pain, fevers, chills.   Review Of Systems: Per HPI. Otherwise 12 point review of systems was performed and was unremarkable.  Patient Active Problem List   Diagnosis Date Noted  . Left hip pain 12/10/2012  .  At high risk for falls 07/19/2012  . Diarrhea 04/06/2012  . Vitamin D deficiency 03/12/2012  . Falls 02/17/2012  . Bradycardia 01/22/2012  . Hemorrhoid 01/12/2012  . GERD (gastroesophageal reflux disease) 11/10/2011  . Knee pain, bilateral 10/14/2011  . Leg edema 09/20/2011  . Fatigue 07/04/2011  . Peripheral vascular disease, unspecified 04/16/2011  . DM (diabetes  mellitus), type 2, uncontrolled 04/16/2011  . Vancomycin adverse reaction 03/13/2011  . COPD (chronic obstructive pulmonary disease) 02/27/2011  . Stasis dermatitis 12/04/2010  . BPH (benign prostatic hyperplasia) 11/01/2010  . Long term (current) use of anticoagulants 05/03/2010  . FROZEN RIGHT SHOULDER 02/25/2010  . DIABETIC PERIPHERAL NEUROPATHY 12/20/2009  . DEPRESSION 07/08/2006  . HYPERTENSION 07/08/2006  . HYPERCHOLESTEROLEMIA 05/21/2006  . ANXIETY 05/21/2006  . CORONARY, ARTERIOSCLEROSIS 05/21/2006  . CHF - EJECTION FRACTION < 50% 05/21/2006  . REFLUX ESOPHAGITIS 05/21/2006  . APNEA, SLEEP 05/21/2006   Past Medical History: Past Medical History  Diagnosis Date  . Depression   . Diabetes mellitus   . COPD (chronic obstructive pulmonary disease)   . Adhesive capsulitis   . MRSA bacteremia     2011 - possible endocarditis, received 6 weeks IV treatment  . Hyperlipidemia   . Anxiety   . GERD (gastroesophageal reflux disease)   . CAD (coronary artery disease)   . 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 CVA (cerebral infarction) Questionable history  . Diverticulosis   . Osteomyelitis 2012    Sternoclavicular joint   . Chronic back pain   . Angina   . Myocardial infarction   . CHF (congestive heart failure)   . Shortness of breath   . Sleep apnea   . Chronic kidney disease     hx of BPH  . Neuromuscular disorder     HX of diabetic periferal neuropathy  . Hypertension   . Peripheral vascular disease    Past Surgical History: Past Surgical History  Procedure Laterality Date  . Septic arthritis      Removal of infected CABG wire by Dr Arlyce Dice - 2011  . Coronary artery bypass graft  1992  . Fracture surgery  1980s    Hip  . Spine surgery  2002    Cervical fusion vertebroplasty C3-4-5  . Coronary stent placement  1999, 2002  . Doppler echocardiography  Sept 2011    EF 50-55% with some impaired diastolic relaxation  . Colonoscopy  2006      Multiple polyps removed, repeat in 5 years  . Right iliopopliteal bypass - [place]  . Femoral-popliteal bypass graft  2008    Left  . Cardiac catheterization  October 18, 2010    Known obstructive disease, no further blockages  . Cardiac catheterization  03/17/2005    EF 35-40%  . US echocardiography  03/04/2006    EF 50-55%  . Amputation  03/16/2011    Procedure: AMPUTATION DIGIT;  Surgeon: Angelia Mould, MD;  Location: [facility] OR;  Service: Vascular;  Laterality: Left;  Great toe   Social History: History  Substance Use Topics  . Smoking status: Former Smoker    Quit date: 06/19/2000  . Smokeless tobacco: Never Used  . Alcohol Use: No   Family History: Family History  Problem Relation Age of Onset  . Heart disease Father    Allergies and Medications: Allergies  Allergen Reactions  . Vancomycin Other (See Comments)    "red man syndrome"  . Diazepam Anxiety    REACTION:  makes patient cry   No current facility-administered medications on file prior to encounter.   Current Outpatient Prescriptions on File Prior to Encounter  Medication Sig Dispense Refill  . acetaminophen-codeine (TYLENOL #4) 300-60 MG per tablet Take 1 tablet by mouth every 6 (six) hours as needed.  60 tablet  1  . aspirin (BABY ASPIRIN) 81 MG chewable tablet Chew 81 mg by mouth daily.       . bisoprolol (ZEBETA) 5 MG tablet Take 0.5 tablets (2.5 mg total) by mouth 2 (two) times daily.  90 tablet  3  . insulin NPH (HUMULIN N,NOVOLIN N) 100 UNIT/ML injection Inject 60 Units into the skin daily before breakfast.       . lisinopril (PRINIVIL,ZESTRIL) 10 MG tablet Take 1 tablet (10 mg total) by mouth daily.  90 tablet  2  . LORazepam (ATIVAN) 0.5 MG tablet Take 1 tablet (0.5 mg total) by mouth at bedtime as needed for anxiety.  30 tablet  1  . omeprazole (PRILOSEC) 40 MG capsule TAKE ONE CAPSULE BY MOUTH ONCE DAILY  30 capsule  3  . QUEtiapine (SEROQUEL) 50 MG tablet Take 1 tablet (50 mg total) by mouth  at bedtime.  30 tablet  3  . simvastatin (ZOCOR) 80 MG tablet TAKE ONE TABLET BY MOUTH AT BEDTIME  90 tablet  1  . tamsulosin (FLOMAX) 0.4 MG CAPS Take 1 capsule (0.4 mg total) by mouth daily after supper.  90 capsule  1  . torsemide (DEMADEX) 20 MG tablet TAKE ONE TABLET BY MOUTH EVERY DAY  60 tablet  2  . venlafaxine XR (EFFEXOR-XR) 37.5 MG 24 hr capsule TAKE ONE CAPSULE BY MOUTH ONCE DAILY  30 capsule  3  . Blood Glucose Monitoring Suppl (ONE TOUCH ULTRA 2) W/DEVICE KIT Please supply patient with 1 glucometer device to use 4 times daily to check blood sugars.  1 each  1  . glucose blood test strip Use as instructed  100 each  12  . nitroGLYCERIN (NITROSTAT) 0.4 MG SL tablet Place 0.4 mg under the tongue as needed. For chest pains      . zoster vaccine live, PF, (ZOSTAVAX) 63817 UNT/0.65ML injection Inject 19,400 Units into the skin once.  1 each  0   Objective: BP 104/47  Pulse 77  Temp(Src) 99 F (37.2 C) (Oral)  Resp 23  SpO2 96% Exam: General: chronically ill appearing elderly gentleman, resting in bed; On [place] O2.  Does not appear in any acute distress.  HEENT: NCAT. PERRLA. Upper dentures in place.  Dry mucous membranes. Cardiovascular: RRR. Distant heart sounds due to body habitus. No appreciable murmur. Respiratory: Diffuse expiratory wheezing.  Abdomen: obese, soft, nontender, nondistended. Extremities: 2+ DP pulses.  Chronic venous stasis changes noted on the RLE. Trace LE edema.  Long scars present on both LE's. Left great toe amputation. Skin: Warm, dry, intact. Neuro: CN 2-12 grossly intact.  Muscle strength 5/5 in upper and lower extremities.  Patient does tire quickly with muscle testing.  Decrease sensation of the plantar aspects of the feet secondary to peripheral neuropathy.   Labs and Imaging: CBC BMET   Recent Labs Lab 04/29/13 2249  WBC 7.8  HGB 13.9  HCT 41.4  PLT 139*    Recent Labs Lab 04/29/13 2249  NA 134*  K 4.5  CL 97  CO2 23  BUN 14   CREATININE 0.85  GLUCOSE 246*  CALCIUM 8.6     Lab Results  Component Value Date  INR 1.82* 04/29/2013   INR 2.5 03/30/2013   INR 2.8 02/28/2013   POC Troponin - 0.01  EKG: NSR with 1 degree AV block.  Incomplete LBBB.  No significant change from priors.   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 No acute intracranial abnormality.  Study discussed by telephone with Dr. Noemi Chapel on 04/30/2013 at 01:07. He advises that the patient presents tonight with generalized weakness and reports "questionable prior stroke" .   Electronically Signed   By: Lars Pinks M.D.   On: 04/30/2013 01:08   Dg Chest Portable 1 View 04/29/2013  IMPRESSION: Mild cardiomegaly without evidence of acute cardiopulmonary disease.     Coral Spikes, DO 04/30/2013, 3:00 AM PGY-2, [place] Intern pager: [phone], text pages welcome

## 2013-04-30 NOTE — Evaluation (Signed)
Occupational Therapy Evaluation Patient Details Name: [name] MRN: [id number] DOB: 08/28/1940 Today's Date: 04/30/2013 Time: 1937-9024 OT Time Calculation (min): 32 min  OT Assessment / Plan / Recommendation History of present illness [name] is a 73 y.o. male presenting with generalized weakness, cough, SOB. PMH is significant for CAD, COPD, HTN, DM-2 (uncontrolled) with peripheral neuropathy, HLD, Systolic CHF, Hx of PE (on Warfarin), and CVA.   Clinical Impression   Pt admitted with above.  He demonstrates the below listed deficits and will benefit from continued OT to maximize safety and independence with BADLs.  Pt. Reports an extensive h/o falls.  He demonstrates Lt. Hip pain 8/10 with sudden movement which has been occurring for "months".  He fatigues quickly with BADLs.  Feel he would benefit from SNF level rehab prior to returning home.     OT Assessment  Patient needs continued OT Services    Follow Up Recommendations  SNF    Barriers to Discharge Decreased caregiver support wife unable to provide level of assist pt requires  Equipment Recommendations  None recommended by OT    Recommendations for Other Services    Frequency  Min 2X/week    Precautions / Restrictions Precautions Precautions: Fall Restrictions Weight Bearing Restrictions: No   Pertinent Vitals/Pain     ADL  Eating/Feeding: Modified independent Where Assessed - Eating/Feeding: Chair Grooming: Wash/dry hands;Wash/dry face;Teeth care;Brushing hair;Set up Where Assessed - Grooming: Unsupported sitting Upper Body Bathing: Minimal assistance Where Assessed - Upper Body Bathing: Unsupported sitting Lower Body Bathing: Maximal assistance Where Assessed - Lower Body Bathing: Supported sit to stand Upper Body Dressing: Minimal assistance Where Assessed - Upper Body Dressing: Unsupported sitting Lower Body Dressing: Maximal assistance Where Assessed - Lower Body Dressing: Supported sit to  stand Toilet Transfer: Minimal assistance Toilet Transfer Method: Sit to stand;Stand pivot Toilet Transfer Equipment: Comfort height toilet;Bedside commode Toileting - Clothing Manipulation and Hygiene: Minimal assistance Where Assessed - Toileting Clothing Manipulation and Hygiene: Standing Equipment Used: Rolling walker Transfers/Ambulation Related to ADLs: min A ADL Comments: Pt with dyspnea 3/4 on 2L.  Fatigues quickly with minmal activity.  Pt with complaint of Lt. hip pain which he report has been present for months    OT Diagnosis: Generalized weakness  OT Problem List: Decreased strength;Decreased activity tolerance;Decreased range of motion;Impaired balance (sitting and/or standing);Decreased coordination;Decreased knowledge of use of DME or AE;Cardiopulmonary status limiting activity;Obesity OT Treatment Interventions: Self-care/ADL training;Energy conservation;DME and/or AE instruction;Therapeutic activities;Balance training;Patient/family education   OT Goals(Current goals can be found in the care plan section) Acute Rehab OT Goals Patient Stated Goal: to get stronger OT Goal Formulation: With patient Time For Goal Achievement: 05/14/13 Potential to Achieve Goals: Good ADL Goals Pt Will Perform Grooming: with supervision;standing Pt Will Perform Upper Body Bathing: with supervision;sitting Pt Will Perform Lower Body Bathing: with min assist;with adaptive equipment;sit to/from stand Pt Will Perform Upper Body Dressing: with supervision;sitting Pt Will Perform Lower Body Dressing: with min assist;with adaptive equipment;sit to/from stand Pt Will Transfer to Toilet: with supervision;ambulating;regular height toilet;bedside commode;grab bars Pt Will Perform Toileting - Clothing Manipulation and hygiene: with supervision;sit to/from stand  Visit Information  Last OT Received On: 04/30/13 Assistance Needed: +1 History of Present Illness: [name] is a 73 y.o. male  presenting with generalized weakness, cough, SOB. PMH is significant for CAD, COPD, HTN, DM-2 (uncontrolled) with peripheral neuropathy, HLD, Systolic CHF, Hx of PE (on Warfarin), and CVA.       Prior Functioning  Home Living Family/patient expects to be discharged to:: Private residence Living Arrangements: Spouse/significant other Available Help at Discharge: Available 24 hours/day Type of Home: House Home Access: Stairs to enter [employer] of Steps: 2 Entrance Stairs-Rails: None Home Layout: One level Home Equipment: [place] - 2 wheels;Walker - 4 wheels;Cane - single point;Bedside commode;Tub bench;Wheelchair - power Prior Function Level of Independence: Independent with assistive device(s);Needs assistance Gait / Transfers Assistance Needed: history of frequent falls at least one fall every 2 wks ADL's / Homemaking Assistance Needed: Pt requires mod -max A with bathing; min A for dressing;  Uses scooter for grocery shopping and wife cleans Comments: used cane mostly for mobility, sometimes rollator Communication Communication: No difficulties Dominant Hand: Right         Vision/Perception     Cognition  Cognition Arousal/Alertness: Awake/alert Behavior During Therapy: WFL for tasks assessed/performed Overall Cognitive Status: Within Functional Limits for tasks assessed    Extremity/Trunk Assessment Upper Extremity Assessment Upper Extremity Assessment: RUE deficits/detail;LUE deficits/detail RUE Deficits / Details: AROM shoulder abduction limited to ~90*; flexion ~110*; fingers with mass grasp, but requires increased time and effort to complete RUE Sensation: decreased light touch RUE Coordination: decreased fine motor LUE Deficits / Details: grossly 4/5 Lower Extremity Assessment Lower Extremity Assessment: Generalized weakness;LLE deficits/detail LLE Deficits / Details: severe pain with MMT and light resistance LLE: Unable to fully assess due to  pain     Mobility Bed Mobility Overal bed mobility: Needs Assistance Bed Mobility: Supine to Sit Supine to sit: Mod assist General bed mobility comments: Heavy reliance on rail, increased time to perform, assist for support to come to seated position (pt reports this is improvement compared to previous day Transfers Overall transfer level: Needs assistance Equipment used: Rolling walker (2 wheeled) Transfers: Sit to/from [facility] Sit to Stand: Min assist Stand pivot transfers: Min assist General transfer comment: assist for stability     Exercise     Balance Balance Overall balance assessment: Needs assistance Sitting-balance support: Feet supported Sitting balance-[name]: Good Standing balance support: Bilateral upper extremity supported Standing balance-[name]: Fair   End of Session OT - End of Session Equipment Utilized During Treatment: Rolling walker;Oxygen Activity Tolerance: Patient limited by fatigue Patient left: in bed;with call bell/phone within reach;with bed alarm set Nurse Communication: Mobility status  [place], Dorice Stiggers M 04/30/2013, 6:41 PM

## 2013-04-30 NOTE — ED Notes (Signed)
Transporting patient to new room assignment. 

## 2013-04-30 NOTE — Evaluation (Signed)
Physical Therapy Evaluation Patient Details Name: [name] MRN: [id number] DOB: 01-19-1941 Today's Date: 04/30/2013 Time: 2878-6767 PT Time Calculation (min): 25 min  PT Assessment / Plan / Recommendation History of Present Illness  [name] is a 73 y.o. male presenting with generalized weakness, cough, SOB. PMH is significant for CAD, COPD, HTN, DM-2 (uncontrolled) with peripheral neuropathy, HLD, Systolic CHF, Hx of PE (on Warfarin), and CVA.  Clinical Impression  Patient demonstrates deficits in functional mobility as indicated below. Patient will benefit from continued skilled PT to address deficits and maximize function. Will continue to see and progress activity as tolerated.   OF NOTE: Patient with extreme sensitivity and pain upon muscle testing LLE. Patient with history of Hip replacement and patient did have a fall on the steps leading to this admission.     PT Assessment  Patient needs continued PT services    Follow Up Recommendations  SNF (vs HHPT with assist if patient makes safe progress)    Does the patient have the potential to tolerate intense rehabilitation      Barriers to Discharge Decreased caregiver support wife not able to provide much physical assist    Equipment Recommendations  None recommended by PT    Recommendations for Other Services     Frequency Min 4X/week    Precautions / Restrictions Precautions Precautions: Fall Restrictions Weight Bearing Restrictions: No   Pertinent Vitals/Pain Extreme pain withdrawal and TTP Left LE above the left knee      Mobility  Bed Mobility Overal bed mobility: Needs Assistance Bed Mobility: Supine to Sit Supine to sit: Mod assist General bed mobility comments: Heavy reliance on rail, increased time to perform, assist for support to come to seated position (pt reports this is improvement compared to previous day Transfers Overall transfer level: Needs assistance Equipment used: Rolling walker  (2 wheeled) Transfers: Sit to/from Stand Sit to Stand: Min assist General transfer comment: assist for stability Ambulation/Gait Ambulation/Gait assistance: Min guard Ambulation Distance (Feet): 16 Feet Assistive device: Rolling walker (2 wheeled) Gait Pattern/deviations: Step-to pattern;Shuffle Gait velocity: decreased Gait velocity interpretation: Below normal speed for age/gender General Gait Details: some evidence of instability noted    Exercises     PT Diagnosis: Difficulty walking;Abnormality of gait;Generalized weakness;Acute pain  PT Problem List: Decreased strength;Decreased range of motion;Decreased activity tolerance;Decreased balance;Decreased mobility;Obesity PT Treatment Interventions: DME instruction;Gait training;Stair training;Functional mobility training;Therapeutic activities;Therapeutic exercise;Balance training;Patient/family education     PT Goals(Current goals can be found in the care plan section) Acute Rehab PT Goals Patient Stated Goal: to get stronger PT Goal Formulation: With patient Time For Goal Achievement: 05/14/13 Potential to Achieve Goals: Fair  Visit Information  Last PT Received On: 04/30/13 Assistance Needed: +1 History of Present Illness: [name] is a 73 y.o. male presenting with generalized weakness, cough, SOB. PMH is significant for CAD, COPD, HTN, DM-2 (uncontrolled) with peripheral neuropathy, HLD, Systolic CHF, Hx of PE (on Warfarin), and CVA.       Prior Susan Moore expects to be discharged to:: Private residence Living Arrangements: Spouse/significant other Type of Home: House Home Access: Stairs to enter [employer] of Steps: 2 Entrance Stairs-Rails: None Home Layout: One level Home Equipment: Environmental consultant - 2 wheels;Walker - 4 wheels;Cane - single point;Bedside commode;Tub bench;Wheelchair - power Prior Function Level of Independence: Independent with assistive device(s) Comments:  used cane mostly for mobility, sometimes rollator Communication Communication: No difficulties Dominant Hand: Right    Cognition  Cognition Arousal/Alertness:  Awake/alert Behavior During Therapy: WFL for tasks assessed/performed Overall Cognitive Status: Within Functional Limits for tasks assessed    Extremity/Trunk Assessment Upper Extremity Assessment Upper Extremity Assessment: Defer to OT evaluation Lower Extremity Assessment Lower Extremity Assessment: Generalized weakness;LLE deficits/detail LLE Deficits / Details: severe pain with MMT and light resistance LLE: Unable to fully assess due to pain      End of Session PT - End of Session Equipment Utilized During Treatment: Gait belt Activity Tolerance: Patient tolerated treatment well;Patient limited by fatigue Patient left: in chair;with call bell/phone within reach Nurse Communication: Mobility status  GP     Duncan Dull 04/30/2013, 3:24 PM Alben Deeds, [place] DPT  [phone]

## 2013-04-30 NOTE — H&P (Signed)
Seen and examined.Discussed with Dr. Lacinda Axon.  Agree with his management and documentation.  Briefly, 73 yo male with multiple chronic medical problems presents with weakness of two days duration.  Issues: 1. Weakness, generalized.  No clear single etiology.  Likely multifactorial.  Contributing factors are: Acute exac of COPD.  Wheezing and mild dyspnea.  Agree with steroids. Likely over treatment of CHF likely over treatment of HBP Likely mild dehydration from diarrhea and poor control of DM Doubt acute neuro event.  Non focal weakness makes much less likely Unclear if social situation is playing a role.  Talks about weakness for only 2 days - then admits it has been coming on and it is increasing hard for wife to care for him.   2. Dispo planning will be an issue.  He will need to recover quickly - but more likely will need short term SNF for rehab.  Will get PT and OT evals.

## 2013-04-30 NOTE — Progress Notes (Signed)
ANTICOAGULATION CONSULT NOTE - Initial Consult  Pharmacy Consult for Coumadin Indication: h/o PE  Allergies  Allergen Reactions  . Vancomycin Other (See Comments)    "red man syndrome"  . Diazepam Anxiety    REACTION: makes patient cry    Patient Measurements:    Vital Signs: Temp: 99 F (37.2 C) (02/06 2227) Temp src: Oral (02/06 2227) BP: 131/62 mmHg (02/07 0345) Pulse Rate: 74 (02/07 0345)  Labs:  Recent Labs  04/29/13 2249 04/29/13 2316  HGB 13.9  --   HCT 41.4  --   PLT 139*  --   LABPROT  --  20.5*  INR  --  1.82*  CREATININE 0.85  --     The CrCl is unknown because both a height and weight (above a minimum accepted value) are required for this calculation.   Medical History: Past Medical History  Diagnosis Date  . Depression   . Diabetes mellitus   . COPD (chronic obstructive pulmonary disease)   . Adhesive capsulitis   . MRSA bacteremia     2011 - possible endocarditis, received 6 weeks IV treatment  . Hyperlipidemia   . Anxiety   . GERD (gastroesophageal reflux disease)   . CAD (coronary artery disease)   . 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 CVA (cerebral infarction) Questionable history  . Diverticulosis   . Osteomyelitis 2012    Sternoclavicular joint   . Chronic back pain   . Angina   . Myocardial infarction   . CHF (congestive heart failure)   . Shortness of breath   . Sleep apnea   . Chronic kidney disease     hx of BPH  . Neuromuscular disorder     HX of diabetic periferal neuropathy  . Hypertension   . Peripheral vascular disease     Medications:  Prescriptions prior to admission  Medication Sig Dispense Refill  . acetaminophen-codeine (TYLENOL #4) 300-60 MG per tablet Take 1 tablet by mouth every 6 (six) hours as needed.  60 tablet  1  . aspirin (BABY ASPIRIN) 81 MG chewable tablet Chew 81 mg by mouth daily.       . bisoprolol (ZEBETA) 5 MG tablet Take 0.5 tablets (2.5 mg total) by mouth 2  (two) times daily.  90 tablet  3  . insulin NPH (HUMULIN N,NOVOLIN N) 100 UNIT/ML injection Inject 60 Units into the skin daily before breakfast.       . lisinopril (PRINIVIL,ZESTRIL) 10 MG tablet Take 1 tablet (10 mg total) by mouth daily.  90 tablet  2  . LORazepam (ATIVAN) 0.5 MG tablet Take 1 tablet (0.5 mg total) by mouth at bedtime as needed for anxiety.  30 tablet  1  . omeprazole (PRILOSEC) 40 MG capsule TAKE ONE CAPSULE BY MOUTH ONCE DAILY  30 capsule  3  . predniSONE (DELTASONE) 5 MG tablet Take 5 mg by mouth daily with breakfast.      . QUEtiapine (SEROQUEL) 50 MG tablet Take 1 tablet (50 mg total) by mouth at bedtime.  30 tablet  3  . simvastatin (ZOCOR) 80 MG tablet TAKE ONE TABLET BY MOUTH AT BEDTIME  90 tablet  1  . tamsulosin (FLOMAX) 0.4 MG CAPS Take 1 capsule (0.4 mg total) by mouth daily after supper.  90 capsule  1  . torsemide (DEMADEX) 20 MG tablet TAKE ONE TABLET BY MOUTH EVERY DAY  60 tablet  2  . venlafaxine XR (EFFEXOR-XR) 37.5 MG 24  hr capsule TAKE ONE CAPSULE BY MOUTH ONCE DAILY  30 capsule  3  . warfarin (COUMADIN) 5 MG tablet Take 5-7.5 mg by mouth daily. Take 1 tablet (63m) daily except Sunday then take 1 1/2 tablets (7.586m      . Blood Glucose Monitoring Suppl (ONE TOUCH ULTRA 2) W/DEVICE KIT Please supply patient with 1 glucometer device to use 4 times daily to check blood sugars.  1 each  1  . glucose blood test strip Use as instructed  100 each  12  . nitroGLYCERIN (NITROSTAT) 0.4 MG SL tablet Place 0.4 mg under the tongue as needed. For chest pains      . zoster vaccine live, PF, (ZOSTAVAX) 1976283NT/0.65ML injection Inject 19,400 Units into the skin once.  1 each  0    Assessment: [age]o male admitted with weakness/SOB, h/o PE, to continue Coumadin Goal of Therapy:  INR 2-3 Monitor platelets by anticoagulation protocol: Yes   Plan:  Coumadin 7.5 mg today Daily INR  AbCaryl Pina/09/2013,4:46 AM

## 2013-05-01 ENCOUNTER — Inpatient Hospital Stay (HOSPITAL_COMMUNITY): Payer: Medicare Other

## 2013-05-01 DIAGNOSIS — F329 Major depressive disorder, single episode, unspecified: Secondary | ICD-10-CM

## 2013-05-01 DIAGNOSIS — F3289 Other specified depressive episodes: Secondary | ICD-10-CM

## 2013-05-01 DIAGNOSIS — I509 Heart failure, unspecified: Secondary | ICD-10-CM

## 2013-05-01 DIAGNOSIS — F411 Generalized anxiety disorder: Secondary | ICD-10-CM

## 2013-05-01 LAB — PROTIME-INR
INR: 1.5 — ABNORMAL HIGH (ref 0.00–1.49)
PROTHROMBIN TIME: 17.7 s — AB (ref 11.6–15.2)

## 2013-05-01 LAB — GLUCOSE, CAPILLARY
GLUCOSE-CAPILLARY: 346 mg/dL — AB (ref 70–99)
GLUCOSE-CAPILLARY: 386 mg/dL — AB (ref 70–99)
GLUCOSE-CAPILLARY: 254 mg/dL — AB (ref 70–99)
Glucose-Capillary: 284 mg/dL — ABNORMAL HIGH (ref 70–99)
Glucose-Capillary: 281 mg/dL — ABNORMAL HIGH (ref 70–99)
Glucose-Capillary: 408 mg/dL — ABNORMAL HIGH (ref 70–99)

## 2013-05-01 MED ORDER — INSULIN ASPART 100 UNIT/ML ~~LOC~~ SOLN: 0.0000 [IU] | Freq: Every day | SUBCUTANEOUS | Status: DC

## 2013-05-01 MED ORDER — INSULIN ASPART 100 UNIT/ML ~~LOC~~ SOLN: 15.0000 [IU] | Freq: Once | SUBCUTANEOUS | Status: DC

## 2013-05-01 MED ORDER — INSULIN NPH (HUMAN) (ISOPHANE) 100 UNIT/ML ~~LOC~~ SUSP
20.0000 [IU] | Freq: Every day | SUBCUTANEOUS | Status: DC
  Administered 2013-05-01: 20 [IU] via SUBCUTANEOUS
  Filled 2013-05-01: qty 10

## 2013-05-01 MED ORDER — INSULIN ASPART 100 UNIT/ML ~~LOC~~ SOLN
0.0000 [IU] | Freq: Three times a day (TID) | SUBCUTANEOUS | Status: DC
  Administered 2013-05-02: 5 [IU] via SUBCUTANEOUS
  Administered 2013-05-02: 8 [IU] via SUBCUTANEOUS

## 2013-05-01 MED ORDER — MOMETASONE FURO-FORMOTEROL FUM 100-5 MCG/ACT IN AERO
2.0000 | INHALATION_SPRAY | Freq: Two times a day (BID) | RESPIRATORY_TRACT | Status: DC
  Filled 2013-05-01: qty 8.8

## 2013-05-01 MED ORDER — WARFARIN SODIUM 10 MG PO TABS
10.0000 mg | ORAL_TABLET | Freq: Once | ORAL | Status: AC
  Administered 2013-05-01: 10 mg via ORAL
  Filled 2013-05-01: qty 1

## 2013-05-01 MED ORDER — INSULIN ASPART 100 UNIT/ML ~~LOC~~ SOLN
0.0000 [IU] | Freq: Every day | SUBCUTANEOUS | Status: DC
  Administered 2013-05-01: 3 [IU] via SUBCUTANEOUS

## 2013-05-01 NOTE — Progress Notes (Signed)
Seen and examined. Discussed with Dr. Raliegh Ip.  Agree with his management and documentation.  See my note of earlier today for details.

## 2013-05-01 NOTE — Progress Notes (Signed)
Family Medicine Teaching Service Daily Progress Note Intern Pager: [phone]  Patient name: [name] Medical record number: [id number] Date of birth: 1940-09-02 Age: 73 y.o. Gender: male  Primary Care Provider: [name], MD Consultants: none Code Status: Full  Pt Overview and Major Events to Date:  Assessment and Plan: [name] is a 73 y.o. male presenting with generalized weakness, cough, SOB. PMH is significant for CAD, COPD, HTN, DM-2 (uncontrolled) with peripheral neuropathy, HLD, Systolic CHF, Hx of PE (on Warfarin), and CVA.   # Generalized weakness - Patient with no focal neurological deficits on exam. 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 Unclear etiology at this time. Patient does appear to be dry on PE with recent diarrhea. Volume depletion could be contributing to his symptomatology. Initial lab work up unremarkable with negative EKG, UA, Chest xray, and CBC/BMP. - Currently VSS, remain on telemetry - IVF's as below  - PT/OT recommendations - SNF (consider HHPT if makes progress with continued PT) - Will consider MRI; however, with no focal deficits currently will not obtain MRI at this time.  # Left LE Pain, acute worsening in setting of chronic pain / osteoarthritis Recent h/o fall, although denies acute injury. - ordered LLE femur Xray to r/o fracture - ordered Pelvis Xray r/o fracture - need continued PT  # Acute COPD exacerbation Presentation with new O2 req in ED, persistent cough and diffuse wheezing, consistent with acute COPD exacerbation - 2L O2 req, monitor O2 sat - Prednisone 50 mg daily (2/7>>) - Duonebs Q6, Albuterol Q2 PRN  - No antibiotics at this time as chest xray was negative. - started Dulera 100-5, 2 puffs BID. Prior h/o Advair documented in chart. - will need rx Albuterol on discharge  # DM-2, uncontrolled  - HgbA1c 9.6 (04/30/13)  - current CBGs 200-300, with received 29u  Novolog in past 24 hr - SSI moderate - start NPH 20u daily in AM, concern for h/o hypoglycemia  # HLD  - Continue atorvastatin 40mg  daily  # HTN  - Patient's BP's were initially low in the ED.  - Currently BP's are well controlled (130's/60's).  - Will hold antihypertensives (Bisoprolol 2.5mg  BID, Lisinopril 10mg  daily), setting of volume depletion.  # Hx of PE  - Coumadin Per Pharmacy  # Hx of CAD; Systolic CHF Last ECHO 7253 (EF 45-50%) - No current chest pain; EKG unremarkable (see below).  - Continuing ASA 81 mg daily.  - Holding antihypertensives as above. - ECHO completed (05/01/13) - results pending - Currently giving fluids as below. Will be cautious given CHF history.  FEN/GI: Carb modified diet. SLIV Prophylaxis: Coumadin per pharmacy.  Disposition: Telemetry - Pending further workup and clinical improvement. Expect multiple day hospitalization, with unclear disposition regarding potential placement SNF vs HHPT. Continue to monitor. Expect >2 more night stay.   Subjective: No acute events overnight. Reports generalized weakness and shortness of breath, both slightly improved today. Yesterday worked with PT, able to transfer from bed to chair, has not been out of bed much at all d/t weakness, also c/o chronic intermittent pain in multiple joints (knees, back, hips, legs), specifically c/o Left hand cramp today. Shortness of breath with non-productive cough, improving on breathing treatments, does not take an inhaler at home. Ate most of breakfast.  With regards to potential SNF placement for short-term rehab, he is currently not interested at this time, but would like to see how he continues to do  and then he may be open to this option. Would prefer HHPT, as he has had this in the past.  Objective: Temp:  [97.8 F (36.6 C)-98.9 F (37.2 C)] 98.9 F (37.2 C) (02/08 0945) Pulse Rate:  [77-89] 84 (02/08 0945) Resp:  [20] 20 (02/08 0523) BP: (94-147)/(29-75) 123/56 mmHg  (02/08 0953) SpO2:  [92 %-96 %] 96 % (02/08 0918) FiO2 (%):  [28 %] 28 % (02/08 0918) Weight:  [251 lb 1.7 oz (113.9 kg)] 251 lb 1.7 oz (113.9 kg) (02/08 0500) Physical Exam: General: chronically ill appearing, obese M, laying on left side (just finished ECHO); 2L O2 [place], NAD  Cardiovascular: RRR. Distant heart sounds due to body habitus. No appreciable murmur.  Respiratory: Diffuse bilateral exp wheezing, tight lungs with reduced air movement b/l. No focal crackles or rhonchi. Normal work of breathing. Abdomen: obese, soft, NTND, +active BS Extremities: 2+ DP pulses. Chronic venous stasis changes noted on the RLE. Trace LE edema. Long scars present on both LE's. Left great toe amputation.  MSK: bilateral LE tenderness on calves R>L Skin: Warm, dry, intact.  Neuro: awake, alert, oriented, CN 2-12 grossly intact. Grossly intact muscle strength with inc pain on lower ext testing, Distal sensation to light touch dec b/l feet  Laboratory:  Recent Labs Lab 04/29/13 2249 04/30/13 0416  WBC 7.8 6.8  HGB 13.9 13.1  HCT 41.4 39.8  PLT 139* 129*    Recent Labs Lab 04/29/13 2249 04/30/13 0416  NA 134* 137  K 4.5 4.6  CL 97 101  CO2 23 24  BUN 14 14  CREATININE 0.85 0.91  CALCIUM 8.6 8.1*  GLUCOSE 246* 216*   HgbA1c - 9.6 Troponin-I - negative x 3 INR 1.82-->1.50-->  Imaging/Diagnostic Tests:  2/6 Chest Portable 1v IMPRESSION:  Mild cardiomegaly without evidence of acute cardiopulmonary disease.  2/7 CT Head w/o contrast IMPRESSION:  Appearance most compatible with chronic left MCA and PCA territory  infarcts. No acute intracranial abnormality.  Study discussed by telephone with Dr. Noemi Chapel on 04/30/2013 at  01:07. He advises that the patient presents tonight with generalized  weakness and reports "questionable prior stroke" .  2/8 2D ECHO Completed - pending results.  2/8 Left Femur Xray Ordered  2/8 Pelvis Xray Ordered  2/8 b/l LE Venous Duplex  US Ordered  Todd Putnam, DO 05/01/2013, 12:03 PM PGY-1, [place] Intern pager: [phone], text pages welcome

## 2013-05-01 NOTE — Progress Notes (Signed)
Physical Therapy Treatment Patient Details Name: [name] MRN: [id number] DOB: 03-31-1940 Today's Date: 05/01/2013 Time: 7322-0254 PT Time Calculation (min): 19 min  PT Assessment / Plan / Recommendation  History of Present Illness [name] is a 73 y.o. male presenting with generalized weakness, cough, SOB. PMH is significant for CAD, COPD, HTN, DM-2 (uncontrolled) with peripheral neuropathy, HLD, Systolic CHF, Hx of PE (on Warfarin), and CVA.   PT Comments   Pt took the opportunity this session to stress that he would much rather dc home, and he is making progress with mobility, so this is not totally out of the question; Will continue to monitor progress  Still, I have concerns re: his ability to manage steps to enter his home -- he reports multiple fall on them and that he and his wife have to help each other  Regardless of exactly where he discharges, it is worth consulting Case Mgmnt to look into any community resources that could help with a ramp or handrails for his steps   Follow Up Recommendations  SNF (vs HHPT with assist if patient makes safe progress)     Does the patient have the potential to tolerate intense rehabilitation     Barriers to Discharge        Equipment Recommendations  None recommended by PT    Recommendations for Other Services    Frequency Min 4X/week   Progress towards PT Goals Progress towards PT goals: Progressing toward goals (Added stair goal)  Plan Current plan remains appropriate    Precautions / Restrictions Precautions Precautions: Fall Restrictions Weight Bearing Restrictions: No   Pertinent Vitals/Pain Pain in LLE; Did not specifically rate patient repositioned for comfort     Mobility  Bed Mobility Overal bed mobility: Needs Assistance Bed Mobility: Supine to Sit Supine to sit: Min guard General bed mobility comments: Heavy reliance on rail, increased time to perform Transfers Overall transfer level: Needs  assistance Equipment used: Rolling walker (2 wheeled) Transfers: Sit to/from Stand Sit to Stand: Min guard General transfer comment: assist for stability; cues for safety and hand placement Ambulation/Gait Ambulation/Gait assistance: Min guard Ambulation Distance (Feet): 45 Feet Assistive device: Rolling walker (2 wheeled) Gait Pattern/deviations: Decreased stride length Gait velocity: decreased General Gait Details: some evidence of instability noted; Heavy dependence on RW for stabiltiy Stairs:  (Reports he has 2 Steps to Enter home)    Exercises     PT Diagnosis:    PT Problem List:   PT Treatment Interventions:     PT Goals (current goals can now be found in the care plan section) Acute Rehab PT Goals Patient Stated Goal: to get stronger PT Goal Formulation: With patient Time For Goal Achievement: 05/14/13 Potential to Achieve Goals: Fair  Visit Information  Last PT Received On: 05/01/13 Assistance Needed: +1 History of Present Illness: [name] is a 73 y.o. male presenting with generalized weakness, cough, SOB. PMH is significant for CAD, COPD, HTN, DM-2 (uncontrolled) with peripheral neuropathy, HLD, Systolic CHF, Hx of PE (on Warfarin), and CVA.    Subjective Data  Subjective: Expressed his concern with going to SNF for rehab; Impressed upon this therapist his desire to dc home Patient Stated Goal: to get stronger   Cognition  Cognition Arousal/Alertness: Awake/alert Behavior During Therapy: WFL for tasks assessed/performed Overall Cognitive Status: Within Functional Limits for tasks assessed    Balance  General Comments General comments (skin integrity, edema, etc.): Noted imaging of hip and pelvis showed no acute  bony abnormality; Proceeded WBAT, and cued pt to bear down into RW to unweigh painful LLE as necessary  End of Session PT - End of Session Equipment Utilized During Treatment: Gait belt Activity Tolerance: Patient tolerated treatment  well;Patient limited by fatigue Patient left: in chair;with call bell/phone within reach Nurse Communication: Mobility status   GP     Roney Marion [facility] [place], [place]  05/01/2013, 5:25 PM

## 2013-05-01 NOTE — Progress Notes (Signed)
ANTICOAGULATION CONSULT NOTE - Follow Up Consult  Pharmacy Consult for Coumadin Indication: h/o PE  Allergies  Allergen Reactions  . Vancomycin Other (See Comments)    "red man syndrome"  . Diazepam Anxiety    REACTION: makes patient cry    Patient Measurements: Weight: 251 lb 1.7 oz (113.9 kg) Heparin Dosing Weight:   Vital Signs: Temp: 98.9 F (37.2 C) (02/08 0945) Temp src: Oral (02/08 0945) BP: 123/56 mmHg (02/08 0953) Pulse Rate: 84 (02/08 0945)  Labs:  Recent Labs  04/29/13 2249 04/29/13 2316 04/30/13 0410 04/30/13 0416 04/30/13 1100 04/30/13 1636 05/01/13 0550  HGB 13.9  --   --  13.1  --   --   --   HCT 41.4  --   --  39.8  --   --   --   PLT 139*  --   --  129*  --   --   --   LABPROT  --  20.5*  --   --   --   --  17.7*  INR  --  1.82*  --   --   --   --  1.50*  CREATININE 0.85  --   --  0.91  --   --   --   TROPONINI  --   --  <0.30  --  <0.30 <0.30  --     The CrCl is unknown because both a height and weight (above a minimum accepted value) are required for this calculation.  Assessment: Weakness/SOB  Anticoagulation: Coumadin 5 mg daily except 7.5 mg at home for h/o PE with admit INR 1.82. INR today is down to 1.5.  Infectious Disease: Afebrile. WBC 6.8. CXR clear  Cardiovascular CAD Htn HLD CHF (EF 45-50%). VSS. Meds: ASA81, Lipitor, (hold bisoprolol and lisinopril)  Endocrinology DM on SSI and NPH. A1C 9.6  Gastrointestinal / Nutrition: po PPI  Neurology: h/o CVA on Seroquel and Effexor. No new acute focal deficits. Chronic pain with OA  Nephrology: Scr 0.91, Flomax  Pulmonary COPD on duonebs and prednisone  Hematology / Oncology H/o PE  PTA Medication Issues: Coumadin, PPI  Best Practices  Goal of Therapy:  INR 2-3 Monitor platelets by anticoagulation protocol: Yes   Plan:  Coumadin 10mg  po x 1 tonight Continue daily INR  Leitha Hyppolite S. Alford Highland, PharmD, BCPS Clinical Staff Pharmacist Pager [phone]  Eilene Ghazi  [place] 05/01/2013,11:19 AM

## 2013-05-01 NOTE — Progress Notes (Signed)
Mr. [name] tells me he fells rough.  See PT note re: concern of left leg pain and recent falls.  He describes pain from knee to hip.  Will X ray to R/O fracture so PT can get to work on him  I am coming to believe that many/most of his issues are chronic even though his history is acute decompensation 2 days pta.  I do not yet have a firm idea as to how we will turn him around.

## 2013-05-01 NOTE — Progress Notes (Signed)
CRITICAL VALUE ALERT  Critical value received: Blood sugar 408  Date of notification:  05/01/13  Time of notification:  2200  Critical value read back:yes  Nurse who received alert:  Lashante Fryberger A.Messer RN,BSN  MD notified (1st page):  Family Medicine Intern- Dr. Kenn File  Time of first page:  2205  MD notified (2nd page):2209  Time of second page:  Responding MD: Alen Bleacher  Time MD responded:  2209  New orders received. Will continue to monitor. Verdie Drown RN BSN

## 2013-05-01 NOTE — Discharge Instructions (Addendum)
Follow up as indicated.  Please take your medication as prescribed.   Information on my medicine - Coumadin   (Warfarin)  This medication education was reviewed with me or my healthcare representative as part of my discharge preparation.  The pharmacist that spoke with me during my hospital stay was:  Wayland Salinas, [facility]  Why was Coumadin prescribed for you? Coumadin was prescribed for you because you have a blood clot or a medical condition that can cause an increased risk of forming blood clots. Blood clots can cause serious health problems by blocking the flow of blood to the heart, lung, or brain. Coumadin can prevent harmful blood clots from forming. As a reminder your indication for Coumadin is:   Blood Clotting Disorder  What test will check on my response to Coumadin? While on Coumadin (warfarin) you will need to have an INR test regularly to ensure that your dose is keeping you in the desired range. The INR (international normalized ratio) number is calculated from the result of the laboratory test called prothrombin time (PT).  If an INR APPOINTMENT HAS NOT ALREADY BEEN MADE FOR YOU please schedule an appointment to have this lab work done by your health care provider within 7 days. Your INR goal is usually a number between:  2 to 3 or your provider may give you a more narrow range like 2-2.5.  Ask your health care provider during an office visit what your goal INR is.  What  do you need to  know  About  COUMADIN? Take Coumadin (warfarin) exactly as prescribed by your healthcare provider about the same time each day.  DO NOT stop taking without talking to the doctor who prescribed the medication.  Stopping without other blood clot prevention medication to take the place of Coumadin may increase your risk of developing a new clot or stroke.  Get refills before you run out.  What do you do if you miss a dose? If you miss a dose, take it as soon as you remember on the  same day then continue your regularly scheduled regimen the next day.  Do not take two doses of Coumadin at the same time.  Important Safety Information A possible side effect of Coumadin (Warfarin) is an increased risk of bleeding. You should call your healthcare provider right away if you experience any of the following:   Bleeding from an injury or your nose that does not stop.   Unusual colored urine (red or dark brown) or unusual colored stools (red or black).   Unusual bruising for unknown reasons.   A serious fall or if you hit your head (even if there is no bleeding).  Some foods or medicines interact with Coumadin (warfarin) and might alter your response to warfarin. To help avoid this:   Eat a balanced diet, maintaining a consistent amount of Vitamin K.   Notify your provider about major diet changes you plan to make.   Avoid alcohol or limit your intake to 1 drink for women and 2 drinks for men per day. (1 drink is 5 oz. wine, 12 oz. beer, or 1.5 oz. liquor.)  Make sure that ANY health care provider who prescribes medication for you knows that you are taking Coumadin (warfarin).  Also make sure the healthcare provider who is monitoring your Coumadin knows when you have started a new medication including herbals and non-prescription products.  Coumadin (Warfarin)  Major Drug Interactions  Increased Warfarin Effect Decreased Warfarin Effect  Alcohol (  large quantities) Antibiotics (esp. Septra/Bactrim, Flagyl, Cipro) Amiodarone (Cordarone) Aspirin (ASA) Cimetidine (Tagamet) Megestrol (Megace) NSAIDs (ibuprofen, naproxen, etc.) Piroxicam (Feldene) Propafenone (Rythmol SR) Propranolol (Inderal) Isoniazid (INH) Posaconazole (Noxafil) Barbiturates (Phenobarbital) Carbamazepine (Tegretol) Chlordiazepoxide (Librium) Cholestyramine (Questran) Griseofulvin Oral Contraceptives Rifampin Sucralfate (Carafate) Vitamin K   Coumadin (Warfarin) Major Herbal Interactions    Increased Warfarin Effect Decreased Warfarin Effect  Garlic Ginseng Ginkgo biloba Coenzyme Q10 Green tea St. Johns wort    Coumadin (Warfarin) FOOD Interactions  Eat a consistent number of servings per week of foods HIGH in Vitamin K (1 serving =  cup)  Collards (cooked, or boiled & drained) Kale (cooked, or boiled & drained) Mustard greens (cooked, or boiled & drained) Parsley *serving size only =  cup Spinach (cooked, or boiled & drained) Swiss chard (cooked, or boiled & drained) Turnip greens (cooked, or boiled & drained)  Eat a consistent number of servings per week of foods MEDIUM-HIGH in Vitamin K (1 serving = 1 cup)  Asparagus (cooked, or boiled & drained) Broccoli (cooked, boiled & drained, or raw & chopped) Brussel sprouts (cooked, or boiled & drained) *serving size only =  cup Lettuce, raw (green leaf, endive, romaine) Spinach, raw Turnip greens, raw & chopped   These websites have more information on Coumadin (warfarin):  FailFactory.se; VeganReport.com.au;

## 2013-05-01 NOTE — Progress Notes (Signed)
Echo Lab  2D Echocardiogram completed.  [place], RDCS 05/01/2013 9:08 AM

## 2013-05-02 LAB — CK: Total CK: 165 U/L (ref 7–232)

## 2013-05-02 LAB — PROTIME-INR
INR: 2.43 — AB (ref 0.00–1.49)
PROTHROMBIN TIME: 25.6 s — AB (ref 11.6–15.2)

## 2013-05-02 LAB — GLUCOSE, CAPILLARY
GLUCOSE-CAPILLARY: 294 mg/dL — AB (ref 70–99)
Glucose-Capillary: 212 mg/dL — ABNORMAL HIGH (ref 70–99)
Glucose-Capillary: 358 mg/dL — ABNORMAL HIGH (ref 70–99)

## 2013-05-02 LAB — SEDIMENTATION RATE: SED RATE: 19 mm/h — AB (ref 0–16)

## 2013-05-02 MED ORDER — INSULIN NPH (HUMAN) (ISOPHANE) 100 UNIT/ML ~~LOC~~ SUSP
30.0000 [IU] | Freq: Every day | SUBCUTANEOUS | Status: DC
  Administered 2013-05-02: 30 [IU] via SUBCUTANEOUS
  Filled 2013-05-02 (×2): qty 10

## 2013-05-02 MED ORDER — WARFARIN SODIUM 4 MG PO TABS
4.0000 mg | ORAL_TABLET | Freq: Once | ORAL | Status: AC
  Administered 2013-05-02: 4 mg via ORAL
  Filled 2013-05-02: qty 1

## 2013-05-02 MED ORDER — INSULIN ASPART 100 UNIT/ML ~~LOC~~ SOLN: 4.0000 [IU] | Freq: Three times a day (TID) | SUBCUTANEOUS | Status: DC

## 2013-05-02 MED ORDER — INSULIN ASPART 100 UNIT/ML ~~LOC~~ SOLN: 0.0000 [IU] | Freq: Every day | SUBCUTANEOUS | Status: DC

## 2013-05-02 MED ORDER — IPRATROPIUM-ALBUTEROL 0.5-2.5 (3) MG/3ML IN SOLN
3.0000 mL | RESPIRATORY_TRACT | Status: DC
  Administered 2013-05-02: 3 mL via RESPIRATORY_TRACT
  Filled 2013-05-02: qty 3

## 2013-05-02 MED ORDER — INSULIN ASPART 100 UNIT/ML ~~LOC~~ SOLN
0.0000 [IU] | Freq: Three times a day (TID) | SUBCUTANEOUS | Status: DC
  Administered 2013-05-02: 15 [IU] via SUBCUTANEOUS

## 2013-05-02 MED ORDER — PREDNISONE 50 MG PO TABS: ORAL_TABLET | ORAL | Status: DC

## 2013-05-02 NOTE — Progress Notes (Addendum)
Occupational Therapy Treatment Patient Details Name: [name] MRN: [id number] DOB: 04-04-1940 Today's Date: 05/02/2013 Time: 6213-0865 OT Time Calculation (min): 15 min  OT Assessment / Plan / Recommendation  History of present illness [name] is a 73 y.o. male presenting with generalized weakness, cough, SOB. PMH is significant for CAD, COPD, HTN, DM-2 (uncontrolled) with peripheral neuropathy, HLD, Systolic CHF, Hx of PE (on Warfarin), and CVA.   OT comments  Pt performed grooming at sink and ambulated in hallway. Educated on energy conservation techniques.   Follow Up Recommendations  SNF    Barriers to Discharge       Equipment Recommendations  None recommended by OT    Recommendations for Other Services    Frequency Min 2X/week   Progress towards OT Goals Progress towards OT goals: Progressing toward goals  Plan Discharge plan remains appropriate    Precautions / Restrictions Precautions Precautions: Fall Restrictions Weight Bearing Restrictions: No   Pertinent Vitals/Pain Pain 6/10 in shoulders and legs. Nurse notified. O2 89%-92% on RA. Placed back on O2.    ADL  Grooming: Wash/dry face;Denture care;Min guard Where Assessed - Grooming: Supported standing;Supported sitting Toilet Transfer: Magazine features editor Method: Sit to Loss adjuster, chartered: Bedside commode (also from bed) Equipment Used: Gait belt;Rolling walker Transfers/Ambulation Related to ADLs: Min guard  ADL Comments: Educated on energy conservation techniques. Pt performed grooming at sink and also ambulated in hallway.     OT Diagnosis:    OT Problem List:   OT Treatment Interventions:     OT Goals(current goals can now be found in the care plan section) Acute Rehab OT Goals Patient Stated Goal: not stated OT Goal Formulation: With patient Time For Goal Achievement: 05/14/13 Potential to Achieve Goals: Good ADL Goals Pt Will Perform Grooming: with  supervision;standing Pt Will Perform Upper Body Bathing: with supervision;sitting Pt Will Perform Lower Body Bathing: with min assist;with adaptive equipment;sit to/from stand Pt Will Perform Upper Body Dressing: with supervision;sitting Pt Will Perform Lower Body Dressing: with min assist;with adaptive equipment;sit to/from stand Pt Will Transfer to Toilet: with supervision;ambulating;regular height toilet;bedside commode;grab bars Pt Will Perform Toileting - Clothing Manipulation and hygiene: with supervision;sit to/from stand  Visit Information  Last OT Received On: 05/02/13 Assistance Needed: +1 History of Present Illness: [name] is a 73 y.o. male presenting with generalized weakness, cough, SOB. PMH is significant for CAD, COPD, HTN, DM-2 (uncontrolled) with peripheral neuropathy, HLD, Systolic CHF, Hx of PE (on Warfarin), and CVA.    Subjective Data      Prior Functioning       Cognition  Cognition Arousal/Alertness: Awake/alert Behavior During Therapy: Anxious Overall Cognitive Status: Within Functional Limits for tasks assessed    Mobility  Bed Mobility Overal bed mobility: Needs Assistance Bed Mobility: Supine to Sit Supine to sit: Supervision Transfers Overall transfer level: Needs assistance Equipment used: Rolling walker (2 wheeled) Transfers: Sit to/from Stand Sit to Stand: Min guard General transfer comment: Cues for hand placement.    Exercises      Balance    End of Session OT - End of Session Equipment Utilized During Treatment: Rolling walker;Gait belt;Oxygen Activity Tolerance: Patient tolerated treatment well Patient left: in chair;with call bell/phone within reach;with family/visitor present Nurse Communication: Other (comment) (O2)  GO     Benito Mccreedy OTR/L [phone] 05/02/2013, 5:53 PM

## 2013-05-02 NOTE — Progress Notes (Signed)
Inpatient Diabetes Program Recommendations  AACE/ADA: New Consensus Statement on Inpatient Glycemic Control (2013)  Target Ranges:  Prepandial:   less than 140 mg/dL      Peak postprandial:   less than 180 mg/dL (1-2 hours)      Critically ill patients:  140 - 180 mg/dL   Reason for Visit: Hyperglycemia  Diabetes history: Type 2 Outpatient Diabetes medications: NPH 60 units QAM Current orders for Inpatient glycemic control: NPH 30 units QAM and Novolog moderate tidwc and hs  Inpatient Diabetes Program Recommendations Insulin - Basal: NPH increased to 30 units this am. Continue to titrate until FBS < 180 mg/dL Insulin - Meal Coverage: Consider addition of Novolog 4 units tidwc HgbA1C: 9.6% - up from 7.9% on 12/09/2012 Outpatient Referral: Will order OP Diabetes Education for uncontrolled DM  Note: Will follow. Thank you. Lorenda Peck, RD, LDN, CDE Inpatient Diabetes Coordinator [phone]

## 2013-05-02 NOTE — Progress Notes (Signed)
Physical Therapy Treatment Patient Details Name: [name] MRN: [id number] DOB: Jan 31, 1941 Today's Date: 05/02/2013 Time: 5366-4403 PT Time Calculation (min): 23 min  PT Assessment / Plan / Recommendation  History of Present Illness [name] is a 73 y.o. male presenting with generalized weakness, cough, SOB. PMH is significant for CAD, COPD, HTN, DM-2 (uncontrolled) with peripheral neuropathy, HLD, Systolic CHF, Hx of PE (on Warfarin), and CVA.   PT Comments   Patient continues to demonstrate limited activity tolerance, instability and still requires assist for mobility.  Patient minimally ambulated, oxygen desaturated to 91%.  Will continue to progress as tolerated. At this time still recommend ST SNF upon discharge.   Follow Up Recommendations  SNF (vs HHPT with assist if patient makes safe progress)     Does the patient have the potential to tolerate intense rehabilitation     Barriers to Discharge        Equipment Recommendations  None recommended by PT    Recommendations for Other Services    Frequency Min 4X/week   Progress towards PT Goals Progress towards PT goals: Progressing toward goals  Plan Current plan remains appropriate    Precautions / Restrictions Precautions Precautions: Fall Restrictions Weight Bearing Restrictions: No   Pertinent Vitals/Pain No pain reported, SpO2 91% with activity 2 liters    Mobility  Bed Mobility Overal bed mobility: Needs Assistance Bed Mobility: Supine to Sit Supine to sit: Min guard General bed mobility comments: Heavy reliance on rail, increased time to perform Transfers Overall transfer level: Needs assistance Equipment used: Rolling walker (2 wheeled) Transfers: Sit to/from Stand Sit to Stand: Min guard General transfer comment: from both the bed as well as the toilet, VCs for hand placement and safety Ambulation/Gait Ambulation/Gait assistance: Min guard Ambulation Distance (Feet): 50 Feet Assistive device:  Rolling walker (2 wheeled) Gait Pattern/deviations: Decreased stride length Gait velocity: decreased Gait velocity interpretation: Below normal speed for age/gender General Gait Details: Heavy dependence on RW, decreased gait and continued instability noted.      PT Goals (current goals can now be found in the care plan section) Acute Rehab PT Goals Patient Stated Goal: to get stronger PT Goal Formulation: With patient Time For Goal Achievement: 05/14/13 Potential to Achieve Goals: Fair  Visit Information  Last PT Received On: 05/02/13 Assistance Needed: +1 Reason Eval/Treat Not Completed: Patient at procedure or test/unavailable History of Present Illness: [name] is a 73 y.o. male presenting with generalized weakness, cough, SOB. PMH is significant for CAD, COPD, HTN, DM-2 (uncontrolled) with peripheral neuropathy, HLD, Systolic CHF, Hx of PE (on Warfarin), and CVA.    Subjective Data  Subjective: I can't tell when im having a bowel movement Patient Stated Goal: to get stronger   Cognition  Cognition Arousal/Alertness: Awake/alert Behavior During Therapy: WFL for tasks assessed/performed Overall Cognitive Status: Within Functional Limits for tasks assessed    Balance  Balance Overall balance assessment: Needs assistance Sitting balance-[name]: Good Standing balance-[name]: Fair  End of Session PT - End of Session Equipment Utilized During Treatment: Gait belt Activity Tolerance: Patient tolerated treatment well;Patient limited by fatigue Patient left: in chair;with call bell/phone within reach Nurse Communication: Mobility status, incontinence of urine and stool, patient hygiene performed. Linens need to be placed on bed.   GP     Duncan Dull 05/02/2013, 12:04 PM Alben Deeds, [place] DPT  [phone]

## 2013-05-02 NOTE — Progress Notes (Signed)
UR complete.  Alea Ryer RN, MSN 

## 2013-05-02 NOTE — Discharge Summary (Signed)
[facility] Discharge Summary  Patient name: [name] Medical record number: [id number] Date of birth: [birth year] Age: 73 y.o. Gender: male Date of Admission: 04/29/2013  Date of Discharge: 05/02/13 Admitting Physician: Zigmund Gottron, MD  Primary Care Provider: [name], MD Consultants: none  Indication for Hospitalization: Generalized weakness, Coughing with wheezing, Shortness of breath  Discharge Diagnoses/Problem List:  Generalized Weakness, subacute worsening of chronic condition - Improved Osteoarthritis bilateral lower extremities, s/p Left Hip Total Replacement - Improved Acute COPD Exacerbation - Resolved Systolic CHF, secondary to h/o CAD with ischemic cardiomyopathy H/o prior PE, on anticoagulation Diabetes, Type 2, poorly controlled HTN HLD  Disposition: Home  Discharge Condition: Stable  Brief Hospital Course: [name] is a 73 y.o. male who presented with generalized weakness, cough, SOB. PMH is significant for CAD, COPD, HTN, DM-2 (uncontrolled) with peripheral neuropathy, HLD, Systolic CHF, Hx of PE (on Warfarin), and CVA.   # Generalized Weakness, subacute worsening of chronic condition - Improved # Osteoarthritis bilateral lower extremities, s/p Left Hip Total Replacement - Improved Patient presented with worsening generalized weakness x 2 days and limited to being bedbound, exacerbated by chronic LE pain in setting of OA. Recent worsening in setting of constellation of other symptoms consistent with URI and dehydration. On admission, neuro exam was non-focal, initial Head CT obtained (no acute concerns, chronic L-MCA and PCA infarcts), other work-up negative (EKG, UA, CXR, CBC/BMP, CK, ESR), LLE and Pelvis Xrays (negative for fracture), unclear etiology but suspected subacute worsening of chronic debilitation with multiple co-morbidities. Consulted PT/OT, initially recommended SNF vs HHPT, however patient and family  had declined SNF, and during course demonstrated continued improvement with PT. Arrangements were made for [facility] PT/OT also RN and SW to provide assistance on discharge.  # Acute COPD Exacerbation - Resolved Presentation with new 2L O2 req in ED, persistent cough and diffuse wheezing, consistent with acute COPD exacerbation. CXR was negative for focal infiltrates, no antibiotics given. Initiated course of steroids, scheduled Duonebs / Albuterol PRN, and started [facility] (note prior records of treatment with Advair, currently not taking at home). Overall continued improvement, and no longer demonstrated O2 requirement prior to discharge.  # Systolic CHF, secondary to h/o CAD with ischemic cardiomyopathy Given h/o systolic CHF and presentation of SOB, initial concern for possible exacerbation, however clinically patient was dehydrated and not fluid overloaded, cautious treatment with IVF. Otherwise, no acute management for CHF. Last ECHO (2011), we obtained repeat 2D ECHO (reduced EF 40-45% 2/2 severe hypokinesis from ischemic cardiomyopathy). No complaints of chest pain during hospitalization, however troponins initially cycled given unclear presentation (negative x 3) and EKG without acute changes. Continued home ASA, held Torsemide 43m daily with plans to resume on discharge.   # H/o prior PE, on anticoagulation Continued on Coumadin per pharmacy. Discussed concerns with fall risk, however believe that relative immobilization in setting of h/o prior PE would warrant anticoagulation, and outweigh risk of bleed on fall.  # Diabetes, Type 2, poorly controlled Last HgbA1c 9.6 (04/30/13), demonstrates poor control. Presented with home regimen of NPH 60u in AM daily. On admission, provided coverage with SSI and reduced NPH to 20u, monitored CBGs and increased to 30u daily, assistance with DM coordinator. Resume home regimen on discharge.  # HLD  Continued atorvastatin 456mdaily  # HTN  Patient's BP's were  initially low in the ED (100/40s), on admission held anti-HTN meds (Bisoprolol, Lisinopril) with concern for hypovolemia due to dehydration. BPs improved, resumed meds  prior to discharge.  Issues for Follow Up: 1. Generalized Weakness / Debilitation - Monitor if improving at home with [facility], otherwise would strongly re-consider SNF placement for short-term vs extended rehab, especially given family concerns about management at home.  Significant Procedures: none  Significant Labs and Imaging:   Recent Labs Lab 04/29/13 2249 04/30/13 0416  WBC 7.8 6.8  HGB 13.9 13.1  HCT 41.4 39.8  PLT 139* 129*    Recent Labs Lab 04/29/13 2249 04/30/13 0416  NA 134* 137  K 4.5 4.6  CL 97 101  CO2 23 24  GLUCOSE 246* 216*  BUN 14 14  CREATININE 0.85 0.91  CALCIUM 8.6 8.1*   HgbA1c - 9.6  Troponin-I - negative x 3  INR 1.82-->1.50-->2.43  ESR - 19 CK - 165  Imaging/Diagnostic Tests:  2/6 Chest Portable 1v  IMPRESSION:  Mild cardiomegaly without evidence of acute cardiopulmonary disease.  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 No acute intracranial abnormality.  Study discussed by telephone with Dr. Noemi Chapel on 04/30/2013 at  01:07. He advises that the patient presents tonight with generalized  weakness and reports "questionable prior stroke".  2/8 2D ECHO  Completed - EF 40-45%, c/w ischemic cardiomyopathy  2/8 Left Femur Xray  IMPRESSION:  No acute abnormality seen.  2/8 Pelvis Xray  IMPRESSION:  No acute abnormality seen  2/8 b/l LE Venous Duplex US  No evidence of deep vein thrombosis involving the right lower extremity and left lower extremity.  Results/Tests Pending at Time of Discharge: none  Discharge Medications:    Medication List         acetaminophen-codeine 300-60 MG per tablet  Commonly known as:  TYLENOL #4  Take 1 tablet by mouth every 6 (six) hours as needed.      BABY ASPIRIN 81 MG chewable tablet  Generic drug:  aspirin  Chew 81 mg by mouth daily.     bisoprolol 5 MG tablet  Commonly known as:  ZEBETA  Take 0.5 tablets (2.5 mg total) by mouth 2 (two) times daily.     glucose blood test strip  Use as instructed     insulin NPH Human 100 UNIT/ML injection  Commonly known as:  HUMULIN N,NOVOLIN N  Inject 60 Units into the skin daily before breakfast.     lisinopril 10 MG tablet  Commonly known as:  PRINIVIL,ZESTRIL  Take 1 tablet (10 mg total) by mouth daily.     LORazepam 0.5 MG tablet  Commonly known as:  ATIVAN  Take 1 tablet (0.5 mg total) by mouth at bedtime as needed for anxiety.     nitroGLYCERIN 0.4 MG SL tablet  Commonly known as:  NITROSTAT  Place 0.4 mg under the tongue as needed. For chest pains     omeprazole 40 MG capsule  Commonly known as:  PRILOSEC  TAKE ONE CAPSULE BY MOUTH ONCE DAILY     ONE TOUCH ULTRA 2 W/DEVICE Kit  Please supply patient with 1 glucometer device to use 4 times daily to check blood sugars.     predniSONE 50 MG tablet  Commonly known as:  DELTASONE  1 tablet daily.     QUEtiapine 50 MG tablet  Commonly known as:  SEROQUEL  Take 1 tablet (50 mg total) by mouth at bedtime.     simvastatin 80 MG tablet  Commonly known as:  ZOCOR  TAKE ONE TABLET BY MOUTH AT  BEDTIME     tamsulosin 0.4 MG Caps capsule  Commonly known as:  FLOMAX  Take 1 capsule (0.4 mg total) by mouth daily after supper.     torsemide 20 MG tablet  Commonly known as:  DEMADEX  TAKE ONE TABLET BY MOUTH EVERY DAY     venlafaxine XR 37.5 MG 24 hr capsule  Commonly known as:  EFFEXOR-XR  TAKE ONE CAPSULE BY MOUTH ONCE DAILY     warfarin 5 MG tablet  Commonly known as:  COUMADIN  Take 5-7.5 mg by mouth daily. Take 1 tablet (55m) daily except Sunday then take 1 1/2 tablets (7.598m     zoster vaccine live (PF) 19400 UNT/0.65ML injection  Commonly known as:  ZOSTAVAX  Inject 19,400 Units into the skin once.         Discharge Instructions: Please refer to Patient Instructions section of EMR for full details.  Patient was counseled important signs and symptoms that should prompt return to medical care, changes in medications, dietary instructions, activity restrictions, and follow up appointments.   Follow-Up Appointments: Follow-up Information   [facility] 05[facility]. (10:15 am)    Specialty:  Family Medicine   Contact information:   [address] [id number] 05/03/2013, 9:21 PM PGY-1, [place]

## 2013-05-02 NOTE — Progress Notes (Signed)
Discharge instructions given. Pt verbalized understanding and all questions were answered.  Pt is aware that case manager will contact him tomorrow to set up home health.

## 2013-05-02 NOTE — Progress Notes (Signed)
VASCULAR LAB PRELIMINARY  PRELIMINARY  PRELIMINARY  PRELIMINARY  Bilateral lower extremity venous duplex  completed.    Preliminary report:  Bilateral:  No evidence of DVT, superficial thrombosis, or Baker's Cyst.    Angla Delahunt, RVT 05/02/2013, 3:15 PM

## 2013-05-02 NOTE — Progress Notes (Signed)
Family Medicine Teaching Service Attending Note  I interviewed and examined patient [name] and reviewed their tests and x-rays.  I discussed with Dr. Raliegh Ip and reviewed their note for today.  I agree with their assessment and plan.     Additionally  Feeling better According to nurse he was up to bathroom and moving much better than 2 days ago  ? Due to steroids He would like to discuss with his wife but would proceed toward home discharge with [place] him that he will need to make plans for eventual care as both he and wife are not well

## 2013-05-02 NOTE — Progress Notes (Signed)
PT Cancellation Note  Patient Details Name: [name] MRN: [id number] DOB: Sep 30, 1940   Cancelled Treatment:    Reason Eval/Treat Not Completed: Patient at procedure or test/unavailable   Duncan Dull 05/02/2013, 10:12 AM Alben Deeds, PT DPT  [phone]

## 2013-05-02 NOTE — Progress Notes (Signed)
Family Medicine Teaching Service Daily Progress Note Intern Pager: [phone]  Patient name: [name] Medical record number: [id number] Date of birth: [birth year] Age: 73 y.o. Gender: male  Primary Care Provider: [name], MD Consultants: none Code Status: Full  Pt Overview and Major Events to Date:  Assessment and Plan: [name] is a 73 y.o. male presenting with generalized weakness, cough, SOB. PMH is significant for CAD, COPD, HTN, DM-2 (uncontrolled) with peripheral neuropathy, HLD, Systolic CHF, Hx of PE (on Warfarin), and CVA.   # Generalized weakness - Patient with no focal neurological deficits on exam. 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 Unclear etiology at this time. Patient does appear to be dry on PE with recent diarrhea. Volume depletion could be contributing to his symptomatology. Initial lab work up unremarkable with negative EKG, UA, Chest xray, and CBC/BMP. Overall, suspect chronic weakness, joint pain, and deconditioning contributing to current episode. Continue to recommend SNF based on clinical judgement and PT/OT recs. Will attempt discuss further with patient and family, otherwise plan to discharge to home with [facility] arrangements - Currently VSS, remain on telemetry - ordered CK, ESR - further eval alternative etiology of weakness - PT/OT recommendations - SNF (consider HHPT if makes progress with continued PT), continues to be potential fall risk, especially with stairs. Patient remains insistent on DC home vs short-term SNF - c/s CM - to make arrangements for potential HH PT/OT, RN, and SW if possibly discharged in 1-2 days - Will consider MRI; however, with no focal deficits currently will not obtain MRI at this time.  # Left LE Pain, acute worsening in setting of chronic pain / osteoarthritis Recent h/o fall, although denies acute injury. - Xray: LLE femur / Pelvis - no acute findings, no  fracture - need continued PT  # Acute COPD exacerbation Presentation with new O2 req in ED, persistent cough and diffuse wheezing, consistent with acute COPD exacerbation - 2L O2 req, monitor O2 sat, >95% - Prednisone 50 mg daily (2/7>>), 5 day burst - Duonebs Q4, Albuterol Q2 PRN  - No antibiotics at this time as chest xray was negative. - continue Dulera 100-5, 2 puffs BID. Prior h/o Advair documented in chart. - will need rx Albuterol on discharge  # DM-2, uncontrolled  - HgbA1c 9.6 (04/30/13). H/o hypoglycemia recently - current CBGs 200-290, with received 37u Novolog in past 24 hr - SSI moderate, QHS coverage, + 4u meal-time coverage - increase NPH to 30u daily in AM - DM coordinator following for outpt education/ management  # HLD  - Continue atorvastatin 42m daily  # HTN  - Patient's BP's were initially low in the ED.  - Currently BP's are well controlled, last 110/62 - Will hold antihypertensives (Bisoprolol 2.569mBID, Lisinopril 1080maily), setting of volume depletion.  # Hx of PE  - Coumadin Per Pharmacy  # Hx of CAD; Systolic CHF, secondary to ischemic cardiomyopathy Last ECHO 2011 (EF 45-50%) - No current chest pain; EKG unremarkable (see below).  - Continuing ASA 81 mg daily.  - Holding antihypertensives as above. - ECHO completed (05/01/13) - EF 40-45%, c/w ischemic cardiomyopathy - Cautious with IVF, currently SLIV  FEN/GI: Carb modified diet. SLIV Prophylaxis: Coumadin per pharmacy.  Disposition: Telemetry - Pending further workup and clinical improvement. Expect multiple day hospitalization, with unclear disposition regarding potential placement SNF vs HHPT. Continue to monitor. Expect >2 more night stay.   Subjective: No acute events overnight.  States that he is essentially unchanged today, except worsening of "cold symptoms" feels his "chest is congested", worsened non-productive cough with some pleuritic CP. Reports persistent generalized weakness and  shortness of breath. Still working with PT, denies interest in SNF maintains that he wants to go home when ready, but cannot tell me how close to his baseline he is or when he feels he will be ready. Continue c/o chronic intermittent pain in multiple joints, worse b/l hips (also knees, back, and lower ext).  Objective: Temp:  [97.7 F (36.5 C)-98.3 F (36.8 C)] 98 F (36.7 C) (02/09 1027) Pulse Rate:  [69-88] 83 (02/09 1027) Resp:  [16-20] 20 (02/09 1027) BP: (110-144)/(60-89) 125/72 mmHg (02/09 1027) SpO2:  [92 %-99 %] 93 % (02/09 1027) FiO2 (%):  [28 %] 28 % (02/08 1357) Weight:  [252 lb (114.306 kg)] 252 lb (114.306 kg) (02/09 0500) Physical Exam: General: chronically ill appearing, obese M, laying in bed, 2L O2 [place], NAD  Cardiovascular: RRR. Distant heart sounds due to body habitus. No appreciable murmur.  Respiratory: Scattered bilateral exp wheezing, with bibasilar rhonchi. mild improvement in air movement with overall diminished air movement b/l. Audible upper airway wheezing at rest, no tachypnea Abdomen: obese, soft, NTND, +active BS Extremities: 2+ DP pulses. Chronic venous stasis changes noted on bilateral ext (LLE darker vs RLE) (surgical evidence of prior fem-pop bypass graft b/l). Trace LE edema. Long scars present on both LE's. Left great toe amputation.  MSK: bilateral LE tenderness on calves R>L, bilateral hip tenderness to palpation and with ROM (knee flexion, hip int / ext rotation) Skin: Warm, dry, intact.  Neuro: awake, alert, oriented, CN 2-12 grossly intact. Grossly intact muscle strength with inc pain on lower ext testing, Distal sensation to light touch dec b/l feet  Laboratory:  Recent Labs Lab 04/29/13 2249 04/30/13 0416  WBC 7.8 6.8  HGB 13.9 13.1  HCT 41.4 39.8  PLT 139* 129*    Recent Labs Lab 04/29/13 2249 04/30/13 0416  NA 134* 137  K 4.5 4.6  CL 97 101  CO2 23 24  BUN 14 14  CREATININE 0.85 0.91  CALCIUM 8.6 8.1*  GLUCOSE 246* 216*    HgbA1c - 9.6 Troponin-I - negative x 3 INR 1.82-->1.50-->2.43 ESR - ordered CK - ordered  Imaging/Diagnostic Tests:  2/6 Chest Portable 1v IMPRESSION:  Mild cardiomegaly without evidence of acute cardiopulmonary disease.  2/7 CT Head w/o contrast IMPRESSION:  Appearance most compatible with chronic left MCA and PCA territory  infarcts. No acute intracranial abnormality.  Study discussed by telephone with Dr. Noemi Chapel on 04/30/2013 at  01:07. He advises that the patient presents tonight with generalized  weakness and reports "questionable prior stroke" .  2/8 2D ECHO Completed - EF 40-45%, c/w ischemic cardiomyopathy  2/8 Left Femur Xray IMPRESSION:  No acute abnormality seen.  2/8 Pelvis Xray IMPRESSION:  No acute abnormality seen  2/8 b/l LE Venous Duplex US Ordered  Nobie Putnam, DO 05/02/2013, 12:47 PM PGY-1, [place] Intern pager: [phone], text pages welcome

## 2013-05-02 NOTE — Progress Notes (Signed)
ANTICOAGULATION CONSULT NOTE - Follow Up Consult  Pharmacy Consult for Coumadin Indication: h/o PE  Allergies  Allergen Reactions  . Vancomycin Other (See Comments)    "red man syndrome"  . Diazepam Anxiety    REACTION: makes patient cry    Patient Measurements: Weight: 252 lb (114.306 kg)  Vital Signs: Temp: 98 F (36.7 C) (02/09 1027) Temp src: Oral (02/09 1027) BP: 125/72 mmHg (02/09 1027) Pulse Rate: 83 (02/09 1027)  Labs:  Recent Labs  04/29/13 2249 04/29/13 2316 04/30/13 0410 04/30/13 0416 04/30/13 1100 04/30/13 1636 05/01/13 0550 05/02/13 0840 05/02/13 1159  HGB 13.9  --   --  13.1  --   --   --   --   --   HCT 41.4  --   --  39.8  --   --   --   --   --   PLT 139*  --   --  129*  --   --   --   --   --   LABPROT  --  20.5*  --   --   --   --  17.7* 25.6*  --   INR  --  1.82*  --   --   --   --  1.50* 2.43*  --   CREATININE 0.85  --   --  0.91  --   --   --   --   --   CKTOTAL  --   --   --   --   --   --   --   --  165  TROPONINI  --   --  <0.30  --  <0.30 <0.30  --   --   --     The CrCl is unknown because both a height and weight (above a minimum accepted value) are required for this calculation.  Assessment:  73 y/o male on chronic Coumadin for hx PE. INR is therapeutic at 2.43 (1.5 on 2/8) with rapid increase from yesterday. No bleeding noted, no CBC since 2/7. No major drug interactions noted.  Home dose is Coumadin 5 mg daily except 7.5 mg on Sun.  Goal of Therapy:  INR 2-3 Monitor platelets by anticoagulation protocol: Yes   Plan:  Coumadin 4 mg PO tonight Daily INR Monitor for s/sx of bleeding  [facility], Pharm.D., BCPS Clinical Pharmacist Pager: [phone] 05/02/2013 1:38 PM

## 2013-05-03 NOTE — Care Management Note (Signed)
    Page 1 of 2   05/03/2013     10:06:24 AM   CARE MANAGEMENT NOTE 05/03/2013  Patient:  [name], [name]   Account Number:  [id number]  Date Initiated:  05/03/2013  Documentation initiated by:  Lorne Skeens  Subjective/Objective Assessment:   Patient admitted with CVA. Lives at home with wife.     Action/Plan:   Will follow for discharge needs   Anticipated DC Date:  05/02/2013   Anticipated DC Plan:  [place]  CM consult      Choice offered to / List presented to:  C-1 Patient        [place] arranged  HH-1 RN  [place].   Status of service:   Medicare Important Message given?   (If response is "NO", the following Medicare IM given date fields will be blank) Date Medicare IM given:   Date Additional Medicare IM given:    Discharge Disposition:  [place]  Per UR Regulation:    If discussed at Long Length of Stay Meetings, dates discussed:    Comments:  05/03/13 [place] RN, MSN, CM- Recieved a call at 1700 on 05/02/13 from discharging physician requesting that home health be set up as patient is declining SNF placement.  CM requesting that physician inform patient and family that they will be recieving a call from CM in the morning to make arrangments.  CM called and spoke with patient, who is agreeable to home health.  Patient chose [facility] and contact phone number was provided.  Mary with [facility] was notified and accepted the referral.

## 2013-05-05 ENCOUNTER — Ambulatory Visit (INDEPENDENT_AMBULATORY_CARE_PROVIDER_SITE_OTHER): Payer: Medicare Other | Admitting: *Deleted

## 2013-05-05 DIAGNOSIS — Z8672 Personal history of thrombophlebitis: Secondary | ICD-10-CM

## 2013-05-05 DIAGNOSIS — Z7901 Long term (current) use of anticoagulants: Secondary | ICD-10-CM

## 2013-05-05 LAB — POCT INR: INR: 4.9

## 2013-05-10 ENCOUNTER — Ambulatory Visit: Payer: Medicare Other

## 2013-05-10 ENCOUNTER — Inpatient Hospital Stay: Payer: Medicare Other | Admitting: Family Medicine

## 2013-05-12 ENCOUNTER — Encounter: Payer: Self-pay | Admitting: Family Medicine

## 2013-05-12 ENCOUNTER — Ambulatory Visit (INDEPENDENT_AMBULATORY_CARE_PROVIDER_SITE_OTHER): Payer: Medicare Other | Admitting: *Deleted

## 2013-05-12 ENCOUNTER — Ambulatory Visit (INDEPENDENT_AMBULATORY_CARE_PROVIDER_SITE_OTHER): Payer: Medicare Other | Admitting: Family Medicine

## 2013-05-12 VITALS — BP 95/61 | HR 70 | Temp 97.7°F | Ht 68.0 in | Wt 274.0 lb

## 2013-05-12 DIAGNOSIS — M25559 Pain in unspecified hip: Secondary | ICD-10-CM

## 2013-05-12 DIAGNOSIS — I5022 Chronic systolic (congestive) heart failure: Secondary | ICD-10-CM

## 2013-05-12 DIAGNOSIS — I951 Orthostatic hypotension: Secondary | ICD-10-CM

## 2013-05-12 DIAGNOSIS — Z8672 Personal history of thrombophlebitis: Secondary | ICD-10-CM

## 2013-05-12 DIAGNOSIS — M25552 Pain in left hip: Secondary | ICD-10-CM

## 2013-05-12 DIAGNOSIS — Z7901 Long term (current) use of anticoagulants: Secondary | ICD-10-CM

## 2013-05-12 LAB — POCT INR: INR: 2.1

## 2013-05-12 MED ORDER — HYDROCODONE-ACETAMINOPHEN 5-325 MG PO TABS: 1.0000 | ORAL_TABLET | Freq: Four times a day (QID) | ORAL | Status: DC | PRN

## 2013-05-12 NOTE — Patient Instructions (Addendum)
Don't take the Codeine and hydrocodone together.  On days it hurts bad or days that you have have PT, take the hydrocodone.  Go back and see the Orthopedist and see what they say.    Keep walking in the house like you have been.  This should help.    Come back in about a month to let me know what they say.    Take the fluid pill every other day.  Have Butch Penny weigh you.  If you gain 2 lbs in a day, take an extra fluid pill that day.

## 2013-05-12 NOTE — Progress Notes (Signed)
Subjective:    [name] is a 73 y.o. male who presents to [facility] today for hospital FU:  1.  Hospital FU:  Admitted for generalized weakness and dehydration.  Doing much better since DC from hospital.  Still weaker than usual, but he is improving with weekly home PT.  Does have some pain with PT, mostly in Left leg which is his usual chronic hip and knee pain.   Had to stop PT yesterday.    Does have some lightheadedness when standing suddenly.  DOesn't weigh himself daily (cannot read scales while standing due to small numbers).    2.  Diabetes:  Currently on Lantus. No adverse effects from medication.  No hypoglycemic events.  No paresthesia or peripheral nerve pain.  Measures blood sugars at home every: few days.  Admits to missing dosage several times a week.   Lab Results  Component Value Date   HGBA1C 9.6* 04/30/2013    Prev health:  Currently overdue for colonoscopy and zostavax, both of which he persistently refuses. .  ROS as above per HPI, otherwise neg.  Pertinently, no chest pain, palpitations, SOB, Fever, Chills, Abd pain, N/V/D.   The following portions of the patient's history were reviewed and updated as appropriate: allergies, current medications, past medical history, family and social history, and problem list. Patient is a nonsmoker.    PMH reviewed.  Past Medical History  Diagnosis Date  . Depression   . Diabetes mellitus   . COPD (chronic obstructive pulmonary disease)   . Adhesive capsulitis   . MRSA bacteremia     2011 - possible endocarditis, received 6 weeks IV treatment  . Hyperlipidemia   . Anxiety   . GERD (gastroesophageal reflux disease)   . CAD (coronary artery disease)   . 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 CVA (cerebral infarction) Questionable history  . Diverticulosis   . Osteomyelitis 2012    Sternoclavicular joint   . Chronic back pain   . Angina   . Myocardial infarction   . CHF (congestive heart failure)    . Shortness of breath   . Sleep apnea   . Chronic kidney disease     hx of BPH  . Neuromuscular disorder     HX of diabetic periferal neuropathy  . Hypertension   . Peripheral vascular disease    Past Surgical History  Procedure Laterality Date  . Septic arthritis      Removal of infected CABG wire by Dr Arlyce Dice - 2011  . Coronary artery bypass graft  1992  . Fracture surgery  1980s    Hip  . Spine surgery  2002    Cervical fusion vertebroplasty C3-4-5  . Coronary stent placement  1999, 2002  . Doppler echocardiography  Sept 2011    EF 50-55% with some impaired diastolic relaxation  . Colonoscopy  2006     Multiple polyps removed, repeat in 5 years  . Right iliopopliteal bypass - [place]  . Femoral-popliteal bypass graft  2008    Left  . Cardiac catheterization  October 18, 2010    Known obstructive disease, no further blockages  . Cardiac catheterization  03/17/2005    EF 35-40%  . US echocardiography  03/04/2006    EF 50-55%  . Amputation  03/16/2011    Procedure: AMPUTATION DIGIT;  Surgeon: Angelia Mould, MD;  Location: [facility] OR;  Service: Vascular;  Laterality: Left;  Great toe  Medications reviewed. Current Outpatient Prescriptions  Medication Sig Dispense Refill  . acetaminophen-codeine (TYLENOL #4) 300-60 MG per tablet Take 1 tablet by mouth every 6 (six) hours as needed.  60 tablet  1  . aspirin (BABY ASPIRIN) 81 MG chewable tablet Chew 81 mg by mouth daily.       . bisoprolol (ZEBETA) 5 MG tablet Take 0.5 tablets (2.5 mg total) by mouth 2 (two) times daily.  90 tablet  3  . Blood Glucose Monitoring Suppl (ONE TOUCH ULTRA 2) W/DEVICE KIT Please supply patient with 1 glucometer device to use 4 times daily to check blood sugars.  1 each  1  . glucose blood test strip Use as instructed  100 each  12  . insulin NPH (HUMULIN N,NOVOLIN N) 100 UNIT/ML injection Inject 60 Units into the skin daily before breakfast.       . lisinopril (PRINIVIL,ZESTRIL) 10 MG  tablet Take 1 tablet (10 mg total) by mouth daily.  90 tablet  2  . LORazepam (ATIVAN) 0.5 MG tablet Take 1 tablet (0.5 mg total) by mouth at bedtime as needed for anxiety.  30 tablet  1  . nitroGLYCERIN (NITROSTAT) 0.4 MG SL tablet Place 0.4 mg under the tongue as needed. For chest pains      . omeprazole (PRILOSEC) 40 MG capsule TAKE ONE CAPSULE BY MOUTH ONCE DAILY  30 capsule  3  . predniSONE (DELTASONE) 50 MG tablet 1 tablet daily.  2 tablet  0  . QUEtiapine (SEROQUEL) 50 MG tablet Take 1 tablet (50 mg total) by mouth at bedtime.  30 tablet  3  . simvastatin (ZOCOR) 80 MG tablet TAKE ONE TABLET BY MOUTH AT BEDTIME  90 tablet  1  . tamsulosin (FLOMAX) 0.4 MG CAPS Take 1 capsule (0.4 mg total) by mouth daily after supper.  90 capsule  1  . torsemide (DEMADEX) 20 MG tablet TAKE ONE TABLET BY MOUTH EVERY DAY  60 tablet  2  . venlafaxine XR (EFFEXOR-XR) 37.5 MG 24 hr capsule TAKE ONE CAPSULE BY MOUTH ONCE DAILY  30 capsule  3  . warfarin (COUMADIN) 5 MG tablet Take 5-7.5 mg by mouth daily. Take 1 tablet (24m) daily except Sunday then take 1 1/2 tablets (7.580m      . zoster vaccine live, PF, (ZOSTAVAX) 1961443NT/0.65ML injection Inject 19,400 Units into the skin once.  1 each  0   No current facility-administered medications for this visit.     Objective:   Physical Exam BP 95/61  Pulse 70  Temp(Src) 97.7 F (36.5 C) (Oral)  Ht _0  (1.727 m)  Wt 274 lb (124.286 kg)  BMI 41.67 kg/m2 Gen:  Alert, cooperative patient who appears stated age in no acute distress.  Vital signs reviewed. HEENT: EOMI,  MMM Cardiac:  Regular rate and rhythm Pulm:  Clear to auscultation bilaterally  Abd:  Soft/nondistended/nontender.  Good bowel sounds throughout all four quadrants.  No masses noted.  Exts: +2 edema BL, unchanged from usual.  Chronic venous stasis noted.    MSK:  Some pain with external rotation of hip  No results found for this or any previous visit (from the past 72 hour(s)).

## 2013-05-16 DIAGNOSIS — I951 Orthostatic hypotension: Secondary | ICD-10-CM | POA: Insufficient documentation

## 2013-05-16 NOTE — Assessment & Plan Note (Signed)
Returned to [facility] but don't have any reports States they planned "something" but then he was hospitalized. Encouraged him to return and he states he will call for appt

## 2013-05-16 NOTE — Assessment & Plan Note (Signed)
Decreasing Torsemide to every other day. He's had HF action plan in past based on weight and can verbalize this plan for me -- however today was first day he said he cannot read the scales. Wife previously worked at [employer], discussed having her weigh him daily and increase Torsemide based on plan.   He is amenable to this.  Printed on AVS for him to show Butch Penny (wife).

## 2013-05-16 NOTE — Assessment & Plan Note (Signed)
Agree with beta blocker usage, though does have some orthostasis on this.  See orthostatic

## 2013-05-26 ENCOUNTER — Ambulatory Visit (INDEPENDENT_AMBULATORY_CARE_PROVIDER_SITE_OTHER): Payer: Medicare Other | Admitting: *Deleted

## 2013-05-26 DIAGNOSIS — Z8672 Personal history of thrombophlebitis: Secondary | ICD-10-CM

## 2013-05-26 DIAGNOSIS — Z7901 Long term (current) use of anticoagulants: Secondary | ICD-10-CM

## 2013-05-26 LAB — POCT INR: INR: 4.8

## 2013-06-02 ENCOUNTER — Ambulatory Visit (INDEPENDENT_AMBULATORY_CARE_PROVIDER_SITE_OTHER): Payer: Medicare Other | Admitting: *Deleted

## 2013-06-02 DIAGNOSIS — Z7901 Long term (current) use of anticoagulants: Secondary | ICD-10-CM

## 2013-06-02 DIAGNOSIS — Z8672 Personal history of thrombophlebitis: Secondary | ICD-10-CM

## 2013-06-02 LAB — POCT INR: INR: 2.5

## 2013-06-06 ENCOUNTER — Other Ambulatory Visit: Payer: Self-pay | Admitting: *Deleted

## 2013-06-06 ENCOUNTER — Other Ambulatory Visit: Payer: Self-pay | Admitting: Family Medicine

## 2013-06-06 MED ORDER — TAMSULOSIN HCL 0.4 MG PO CAPS: 0.4000 mg | ORAL_CAPSULE | Freq: Every day | ORAL | Status: DC

## 2013-06-07 ENCOUNTER — Telehealth: Payer: Self-pay | Admitting: Family Medicine

## 2013-06-07 NOTE — Telephone Encounter (Signed)
Pt called and needs refills on his Warfarin, jw

## 2013-06-08 ENCOUNTER — Encounter: Payer: Medicare Other | Attending: Family Medicine | Admitting: *Deleted

## 2013-06-08 ENCOUNTER — Encounter: Payer: Self-pay | Admitting: *Deleted

## 2013-06-08 VITALS — Ht 68.0 in | Wt 276.7 lb

## 2013-06-08 DIAGNOSIS — Z713 Dietary counseling and surveillance: Secondary | ICD-10-CM | POA: Insufficient documentation

## 2013-06-08 DIAGNOSIS — IMO0002 Reserved for concepts with insufficient information to code with codable children: Secondary | ICD-10-CM

## 2013-06-08 DIAGNOSIS — E119 Type 2 diabetes mellitus without complications: Secondary | ICD-10-CM | POA: Insufficient documentation

## 2013-06-08 DIAGNOSIS — E1165 Type 2 diabetes mellitus with hyperglycemia: Secondary | ICD-10-CM

## 2013-06-08 MED ORDER — ONETOUCH ULTRA 2 W/DEVICE KIT: PACK | Status: DC

## 2013-06-08 MED ORDER — WARFARIN SODIUM 5 MG PO TABS: 5.0000 mg | ORAL_TABLET | Freq: Every day | ORAL | Status: DC

## 2013-06-08 NOTE — Telephone Encounter (Signed)
I have sent this in with diagnosis code and how often to used.

## 2013-06-08 NOTE — Telephone Encounter (Signed)
Per [facility] pharmacist,any meter is covered.He request we e-scribe RX for the meter.DX and how many time he test needs to be on prescription. RX for lorazepam has been called in and  Patient was informed.

## 2013-06-08 NOTE — Progress Notes (Signed)
Appt start time: 1630 end time:  1730.  Assessment:  Patient was seen on  06/09/13 for individual diabetes education. [name] presents with his wife following a recent hospitalization. He was diagnosed with T2DM >28yrs ago. He states he has been out of glucose test strips for a month. He notes that an order for a glucometer and testing supplies should be at the pharmacy for him. [name] skin is pasty in color, he walks with a cane and is most unsteady. He has had a recent fall and hurt his back. He is under treatment for this. He is presently taking pain medication. His level of discomfort made it difficult the focus on the teaching. Session ended early due to his discomfort and inability to comprehend any further information.  Current HbA1c: 9.6%  04/2013  Preferred Learning Style:   No preference indicated   Learning Readiness:   Change in progress  MEDICATIONS: See List: Insulin NPH  Intervention:  Nutrition counseling provided.  Discussed diabetes disease process and treatment options.  Discussed physiology of diabetes and role of obesity on insulin resistance.  Encouraged moderate weight reduction to improve glucose levels.  Discussed role of medications and diet in glucose control  Provided education on macronutrients on glucose levels.  Provided education on carb counting, importance of regularly scheduled meals/snacks, and meal planning  Discussed effects of physical activity on glucose levels and long-term glucose control.  Recommended 150 minutes of physical activity/week.  Reviewed patient medications.  Discussed role of medication on blood glucose and possible side effects  Discussed blood glucose monitoring and interpretation.  Discussed recommended target ranges and individual ranges.    Described short-term complications: hyper- and hypo-glycemia.  Discussed causes,symptoms, and treatment options.  Discussed prevention, detection, and treatment of long-term  complications.  Discussed the role of prolonged elevated glucose levels on body systems.  Discussed role of stress on blood glucose levels and discussed strategies to manage psychosocial issues.  Discussed recommendations for long-term diabetes self-care.  Established checklist for medical, dental, and emotional self-care.  Plan:  Aim for 3 Carb Choices per meal (45 grams) +/- 1 either way  Aim for 0-15 Carbs per snack if hungry  Consider reading food labels for Total Carbohydrate and Fat Grams of foods Begin checking BG first thing in the morning as directed by MD  Continue taking medication as directed by MD  Decrease your portion sizes Continue rinsing vegetables in the can  Teaching Method Utilized:  Visual Auditory Hands on  Handouts given during visit include: Living Well with Diabetes Carb Counting  Meal Plan Card My plate Snack sheet  Barriers to learning/adherence to lifestyle change: finances, physical challenges   Diabetes self-care support plan:   [facility] support group  wife  Demonstrated degree of understanding via:  Teach Back   Monitoring/Evaluation:  Dietary intake, exercise, test glucose, and body weight prn.

## 2013-06-08 NOTE — Telephone Encounter (Signed)
Hey guys, would one of you call [place] and ask which glucose meters are covered?  I've tried Onetouch ultra but Ed says they aren't covered.  I'll send in whatever is covered.    Also, will you call in the Lorazepam above?  You guys are the best!  Thanks, Merry Proud

## 2013-06-08 NOTE — Patient Instructions (Addendum)
Plan:  Aim for 3 Carb Choices per meal (45 grams) +/- 1 either way  Aim for 0-15 Carbs per snack if hungry  Consider reading food labels for Total Carbohydrate and Fat Grams of foods Begin checking BG first thing in the morning as directed by MD  Continue taking medication as directed by MD  Decrease your portion sizes Continue rinsing vegetables in the can

## 2013-06-08 NOTE — Telephone Encounter (Signed)
Done

## 2013-06-14 ENCOUNTER — Ambulatory Visit (INDEPENDENT_AMBULATORY_CARE_PROVIDER_SITE_OTHER): Payer: Medicare Other | Admitting: Family Medicine

## 2013-06-14 ENCOUNTER — Encounter: Payer: Self-pay | Admitting: Family Medicine

## 2013-06-14 ENCOUNTER — Ambulatory Visit: Payer: Medicare Other

## 2013-06-14 ENCOUNTER — Ambulatory Visit (INDEPENDENT_AMBULATORY_CARE_PROVIDER_SITE_OTHER): Payer: Medicare Other | Admitting: *Deleted

## 2013-06-14 VITALS — BP 115/42 | HR 61 | Temp 98.0°F

## 2013-06-14 DIAGNOSIS — I509 Heart failure, unspecified: Secondary | ICD-10-CM

## 2013-06-14 DIAGNOSIS — E1165 Type 2 diabetes mellitus with hyperglycemia: Secondary | ICD-10-CM

## 2013-06-14 DIAGNOSIS — Z8672 Personal history of thrombophlebitis: Secondary | ICD-10-CM

## 2013-06-14 DIAGNOSIS — W19XXXA Unspecified fall, initial encounter: Secondary | ICD-10-CM

## 2013-06-14 DIAGNOSIS — IMO0002 Reserved for concepts with insufficient information to code with codable children: Secondary | ICD-10-CM

## 2013-06-14 DIAGNOSIS — Z7901 Long term (current) use of anticoagulants: Secondary | ICD-10-CM

## 2013-06-14 DIAGNOSIS — IMO0001 Reserved for inherently not codable concepts without codable children: Secondary | ICD-10-CM

## 2013-06-14 LAB — CBC
HEMATOCRIT: 44.2 % (ref 39.0–52.0)
Hemoglobin: 14.9 g/dL (ref 13.0–17.0)
MCH: 27.2 pg (ref 26.0–34.0)
MCHC: 33.7 g/dL (ref 30.0–36.0)
MCV: 80.7 fL (ref 78.0–100.0)
PLATELETS: 193 10*3/uL (ref 150–400)
RBC: 5.48 MIL/uL (ref 4.22–5.81)
RDW: 16.7 % — ABNORMAL HIGH (ref 11.5–15.5)
WBC: 11.4 10*3/uL — ABNORMAL HIGH (ref 4.0–10.5)

## 2013-06-14 LAB — POCT INR: INR: 2.9

## 2013-06-14 MED ORDER — TRAMADOL HCL 50 MG PO TABS: 50.0000 mg | ORAL_TABLET | Freq: Three times a day (TID) | ORAL | Status: DC | PRN

## 2013-06-14 MED ORDER — GLUCOSE BLOOD VI STRP: ORAL_STRIP | Status: DC

## 2013-06-14 NOTE — Progress Notes (Signed)
Subjective:    [name] is a 73 y.o. male who presents to [facility] today for FU:  1.  Falls:  Has had increasing falls for past few weeks.  Last fall was Wed (1 week ago). Occurs when walking and trips (mechanical fall).  Has known history of orthostasis, but no longer with lightheadedness.  Has not hit head.  Has not had any LOC.  On Coumadin and has some bruising on areas where has fell (buttocks and forearms).  Doesn't have home health aide.    2.   Diabetes:  Currently on 60 units of insulin (humulin).  Compliance is an issue. No adverse effects from medication.  No hypoglycemic events.   Measures blood sugars at home every:  Day or so, running 200 - 300  Lab Results  Component Value Date   HGBA1C 9.6* 04/30/2013    ROS as above per HPI, otherwise neg.    The following portions of the patient's history were reviewed and updated as appropriate: allergies, current medications, past medical history, family and social history, and problem list. Patient is a nonsmoker.    PMH reviewed.  Past Medical History  Diagnosis Date  . Depression   . Diabetes mellitus   . COPD (chronic obstructive pulmonary disease)   . Adhesive capsulitis   . MRSA bacteremia     2011 - possible endocarditis, received 6 weeks IV treatment  . Hyperlipidemia   . Anxiety   . GERD (gastroesophageal reflux disease)   . CAD (coronary artery disease)   . 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 CVA (cerebral infarction) Questionable history  . Diverticulosis   . Osteomyelitis 2012    Sternoclavicular joint   . Chronic back pain   . Angina   . Myocardial infarction   . CHF (congestive heart failure)   . Shortness of breath   . Sleep apnea   . Chronic kidney disease     hx of BPH  . Neuromuscular disorder     HX of diabetic periferal neuropathy  . Hypertension   . Peripheral vascular disease    Past Surgical History  Procedure Laterality Date  . Septic arthritis      Removal of  infected CABG wire by Dr Arlyce Dice - 2011  . Coronary artery bypass graft  1992  . Fracture surgery  1980s    Hip  . Spine surgery  2002    Cervical fusion vertebroplasty C3-4-5  . Coronary stent placement  1999, 2002  . Doppler echocardiography  Sept 2011    EF 50-55% with some impaired diastolic relaxation  . Colonoscopy  2006     Multiple polyps removed, repeat in 5 years  . Right iliopopliteal bypass - [place]  . Femoral-popliteal bypass graft  2008    Left  . Cardiac catheterization  October 18, 2010    Known obstructive disease, no further blockages  . Cardiac catheterization  03/17/2005    EF 35-40%  . US echocardiography  03/04/2006    EF 50-55%  . 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 Current Outpatient Prescriptions  Medication Sig Dispense Refill  . acetaminophen-codeine (TYLENOL #4) 300-60 MG per tablet Take 1 tablet by mouth every 6 (six) hours as needed.  60 tablet  1  . aspirin (BABY ASPIRIN) 81 MG  chewable tablet Chew 81 mg by mouth daily.       . bisoprolol (ZEBETA) 5 MG tablet Take 0.5 tablets (2.5 mg total) by mouth 2 (two) times daily.  90 tablet  3  . Blood Glucose Monitoring Suppl (ONE TOUCH ULTRA 2) W/DEVICE KIT Please supply patient with 1 glucometer device to use 4 times daily to check blood sugars.  Dx code: 250.0  1 each  1  . glucose blood test strip Use as instructed  100 each  12  . HYDROcodone-acetaminophen (NORCO) 5-325 MG per tablet Take 1 tablet by mouth every 6 (six) hours as needed for moderate pain.  30 tablet  0  . insulin NPH (HUMULIN N,NOVOLIN N) 100 UNIT/ML injection Inject 60 Units into the skin daily before breakfast.       . lisinopril (PRINIVIL,ZESTRIL) 10 MG tablet Take 1 tablet (10 mg total) by mouth daily.  90 tablet  2  . LORazepam (ATIVAN) 0.5 MG tablet TAKE ONE TABLET BY MOUTH AT BEDTIME AS  NEEDED FOR ANXIETY  30 tablet  1  . nitroGLYCERIN (NITROSTAT) 0.4 MG SL tablet Place 0.4 mg under the tongue as needed. For chest pains      . omeprazole (PRILOSEC) 40 MG capsule TAKE ONE CAPSULE BY MOUTH ONCE DAILY  30 capsule  3  . predniSONE (DELTASONE) 50 MG tablet 1 tablet daily.  2 tablet  0  . QUEtiapine (SEROQUEL) 50 MG tablet Take 1 tablet (50 mg total) by mouth at bedtime.  30 tablet  3  . simvastatin (ZOCOR) 80 MG tablet TAKE ONE TABLET BY MOUTH AT BEDTIME  90 tablet  1  . tamsulosin (FLOMAX) 0.4 MG CAPS capsule Take 1 capsule (0.4 mg total) by mouth daily after supper.  90 capsule  1  . torsemide (DEMADEX) 20 MG tablet TAKE ONE TABLET BY MOUTH EVERY DAY  60 tablet  2  . venlafaxine XR (EFFEXOR-XR) 37.5 MG 24 hr capsule TAKE ONE CAPSULE BY MOUTH ONCE DAILY  30 capsule  3  . warfarin (COUMADIN) 5 MG tablet Take 1-1.5 tablets (5-7.5 mg total) by mouth daily. Take 1 tablet (74m) daily except Sunday then take 1 1/2 tablets (7.547m  60 tablet  3  . zoster vaccine live, PF, (ZOSTAVAX) 1941324NT/0.65ML injection Inject 19,400 Units into the skin once.  1 each  0   No current facility-administered medications for this visit.     Objective:   Physical Exam BP 115/42  Pulse 61  Temp(Src) 98 F (36.7 C) (Oral) Gen:  Alert, cooperative patient who appears stated age in no acute distress.  Vital signs reviewed. HEENT: EOMI,  MMM Cardiac:  Regular rate and rhythm Pulm:  Minimal crackles at bases Exts: +1 edema BL, no wounds today Neuro:  Ambulating with cane.  Wide based stance.  No focal deficits throughout Skin:  Some bruising noted lateral aspects of right and left hips near greater trochanter.  Healing.  No signs of abrasion  No results found for this or any previous visit (from the past 72 hour(s)).

## 2013-06-14 NOTE — Patient Instructions (Signed)
Take the Tramadol 1-2 pills every 8 hours if you need it.  Let me know if this isn't any better than the Tylenol #4.  Just call  We will check your blood work today.  I'm going to see about personal home aide.

## 2013-06-15 LAB — COMPREHENSIVE METABOLIC PANEL
ALT: 16 U/L (ref 0–53)
AST: 16 U/L (ref 0–37)
Albumin: 4.2 g/dL (ref 3.5–5.2)
Alkaline Phosphatase: 67 U/L (ref 39–117)
BUN: 14 mg/dL (ref 6–23)
CO2: 22 mEq/L (ref 19–32)
Calcium: 9 mg/dL (ref 8.4–10.5)
Chloride: 102 mEq/L (ref 96–112)
Creat: 1.11 mg/dL (ref 0.50–1.35)
Glucose, Bld: 313 mg/dL — ABNORMAL HIGH (ref 70–99)
Potassium: 4.4 mEq/L (ref 3.5–5.3)
SODIUM: 137 meq/L (ref 135–145)
TOTAL PROTEIN: 6.6 g/dL (ref 6.0–8.3)
Total Bilirubin: 0.5 mg/dL (ref 0.2–1.2)

## 2013-06-15 LAB — PROTIME-INR
INR: 2.57 — ABNORMAL HIGH
Prothrombin Time: 26.9 s — ABNORMAL HIGH (ref 11.6–15.2)

## 2013-06-15 LAB — APTT: aPTT: 45 seconds — ABNORMAL HIGH (ref 24–37)

## 2013-06-17 NOTE — Assessment & Plan Note (Signed)
Increase to 65 units of Humulin. Encouraged compliance Referral to health coach to further discuss compliance.

## 2013-06-17 NOTE — Assessment & Plan Note (Signed)
Has fallen more often now.  Mostly mechanical. He consistently refuses SNF placement based on finances.   Continue home health PT.  Continue cane usage. Plan to refer to health coach for more ideas. Refer for home health aide.

## 2013-07-13 ENCOUNTER — Ambulatory Visit: Payer: Medicare Other

## 2013-07-14 ENCOUNTER — Ambulatory Visit (INDEPENDENT_AMBULATORY_CARE_PROVIDER_SITE_OTHER): Payer: Medicare Other | Admitting: *Deleted

## 2013-07-14 DIAGNOSIS — Z8672 Personal history of thrombophlebitis: Secondary | ICD-10-CM

## 2013-07-14 DIAGNOSIS — Z7901 Long term (current) use of anticoagulants: Secondary | ICD-10-CM

## 2013-07-14 LAB — POCT INR: INR: 2.3

## 2013-08-11 ENCOUNTER — Ambulatory Visit (INDEPENDENT_AMBULATORY_CARE_PROVIDER_SITE_OTHER): Payer: Medicare Other | Admitting: *Deleted

## 2013-08-11 ENCOUNTER — Other Ambulatory Visit: Payer: Self-pay | Admitting: Family Medicine

## 2013-08-11 DIAGNOSIS — Z8672 Personal history of thrombophlebitis: Secondary | ICD-10-CM

## 2013-08-11 DIAGNOSIS — Z7901 Long term (current) use of anticoagulants: Secondary | ICD-10-CM

## 2013-08-11 LAB — POCT INR: INR: 2.1

## 2013-08-11 MED ORDER — ACETAMINOPHEN-CODEINE #4 300-60 MG PO TABS: 1.0000 | ORAL_TABLET | Freq: Four times a day (QID) | ORAL | Status: DC | PRN

## 2013-08-23 ENCOUNTER — Telehealth: Payer: Self-pay | Admitting: *Deleted

## 2013-08-23 NOTE — Telephone Encounter (Signed)
Received call from [place], NP.  She did a home visit with pt today on her assessment pt has developed a ucler on top of right foot that is very painful, yellow drainage, red and swollen; poor circulation was noted lower extremetries.   Pt complained of headache x 3 days and took seven 500 mg Acetaminophen.  Pt is SOB at rest and on exertion.    Pt told Liana Gerold that he was out of lisinopril  and Tylenol # 4 x 1 wk and can not afford to pay for the tylenol # 4.  Per Liana Gerold, lungs are clear.  Pt declined to have A1C drawn today; last A1C was done 04/30/2013 9.6.  Yamineh requested pt have an appt for medication f/u and ulcer on foot.  Appt scheduled for 3:45 PM 08/24/2013.  Left voice message regarding appt time. Derl Barrow, RN

## 2013-08-24 ENCOUNTER — Encounter: Payer: Self-pay | Admitting: Family Medicine

## 2013-08-24 ENCOUNTER — Ambulatory Visit (INDEPENDENT_AMBULATORY_CARE_PROVIDER_SITE_OTHER): Payer: Medicare Other | Admitting: Family Medicine

## 2013-08-24 VITALS — BP 128/68 | HR 100 | Temp 97.7°F

## 2013-08-24 DIAGNOSIS — I872 Venous insufficiency (chronic) (peripheral): Secondary | ICD-10-CM

## 2013-08-24 DIAGNOSIS — E1165 Type 2 diabetes mellitus with hyperglycemia: Secondary | ICD-10-CM

## 2013-08-24 DIAGNOSIS — E1149 Type 2 diabetes mellitus with other diabetic neurological complication: Secondary | ICD-10-CM

## 2013-08-24 DIAGNOSIS — IMO0001 Reserved for inherently not codable concepts without codable children: Secondary | ICD-10-CM

## 2013-08-24 DIAGNOSIS — IMO0002 Reserved for concepts with insufficient information to code with codable children: Secondary | ICD-10-CM

## 2013-08-24 DIAGNOSIS — I8310 Varicose veins of unspecified lower extremity with inflammation: Secondary | ICD-10-CM

## 2013-08-24 LAB — GLUCOSE, CAPILLARY: Glucose-Capillary: 326 mg/dL — ABNORMAL HIGH (ref 70–99)

## 2013-08-24 LAB — POCT GLYCOSYLATED HEMOGLOBIN (HGB A1C): Hemoglobin A1C: 8.5

## 2013-08-26 NOTE — Assessment & Plan Note (Signed)
Still uncontroled.  I am reluctant to make changes and will leave to his continuity physician.

## 2013-08-26 NOTE — Assessment & Plan Note (Signed)
Now with ulcer by history due to self induced thermal injury. Continue local care of ulcer.

## 2013-08-26 NOTE — Assessment & Plan Note (Signed)
Not using elastic stockings.  Stable but poor control.  No skin breakdown due to ulcer

## 2013-08-26 NOTE — Progress Notes (Signed)
   Subjective:    Patient ID: Park Breed, male    DOB: 05-16-1940, 73 y.o.   MRN: [id number]  HPI Foot ulcer x 1 month.  States burned himself with hot rice pack to foot (diabetic neuropathy.) He has been doing local treatment and states healing much better.  DM BS high and a1C is actually better than when last checked.  Still high.     Review of Systems     Objective:   Physical Exam3+ bilateral leg swelling.  With skin changes of chronic venous insufficiency.   Dorsum of right foot has small 1cmx1cm superficial clean ulcer.  Good granulation tissue. Lungs clear Cardiac RRR        Assessment & Plan:

## 2013-08-26 NOTE — Patient Instructions (Signed)
Follow up in two weeks with Dr. Mingo Amber.   Continue the local care of your ulcer as you have been.

## 2013-08-29 ENCOUNTER — Other Ambulatory Visit: Payer: Self-pay | Admitting: Family Medicine

## 2013-08-30 ENCOUNTER — Other Ambulatory Visit: Payer: Self-pay | Admitting: *Deleted

## 2013-08-30 ENCOUNTER — Telehealth: Payer: Self-pay | Admitting: Family Medicine

## 2013-08-30 MED ORDER — BISOPROLOL FUMARATE 5 MG PO TABS: 2.5000 mg | ORAL_TABLET | Freq: Two times a day (BID) | ORAL | Status: DC

## 2013-08-30 NOTE — Telephone Encounter (Signed)
Clarified drug store as [facility] on [address] sent in tramadol.

## 2013-08-31 ENCOUNTER — Other Ambulatory Visit: Payer: Self-pay | Admitting: Family Medicine

## 2013-09-05 ENCOUNTER — Other Ambulatory Visit: Payer: Self-pay | Admitting: Family Medicine

## 2013-09-06 MED ORDER — QUETIAPINE FUMARATE 50 MG PO TABS: 50.0000 mg | ORAL_TABLET | Freq: Every day | ORAL | Status: DC

## 2013-09-08 ENCOUNTER — Ambulatory Visit (INDEPENDENT_AMBULATORY_CARE_PROVIDER_SITE_OTHER): Payer: Medicare Other | Admitting: *Deleted

## 2013-09-08 DIAGNOSIS — Z7901 Long term (current) use of anticoagulants: Secondary | ICD-10-CM

## 2013-09-08 LAB — POCT INR: INR: 3.2

## 2013-09-21 ENCOUNTER — Ambulatory Visit (INDEPENDENT_AMBULATORY_CARE_PROVIDER_SITE_OTHER): Payer: Medicare Other | Admitting: *Deleted

## 2013-09-21 DIAGNOSIS — Z7901 Long term (current) use of anticoagulants: Secondary | ICD-10-CM

## 2013-09-21 DIAGNOSIS — Z8672 Personal history of thrombophlebitis: Secondary | ICD-10-CM

## 2013-09-21 LAB — POCT INR: INR: 2.8

## 2013-10-06 ENCOUNTER — Other Ambulatory Visit: Payer: Self-pay | Admitting: Family Medicine

## 2013-10-10 ENCOUNTER — Encounter: Payer: Self-pay | Admitting: Home Health Services

## 2013-10-10 ENCOUNTER — Ambulatory Visit (INDEPENDENT_AMBULATORY_CARE_PROVIDER_SITE_OTHER): Payer: Medicare Other | Admitting: Home Health Services

## 2013-10-10 VITALS — BP 129/57 | HR 65 | Temp 98.3°F | Ht 68.0 in | Wt 268.0 lb

## 2013-10-10 DIAGNOSIS — Z0000 Encounter for general adult medical examination without abnormal findings: Secondary | ICD-10-CM

## 2013-10-10 DIAGNOSIS — E118 Type 2 diabetes mellitus with unspecified complications: Secondary | ICD-10-CM

## 2013-10-10 DIAGNOSIS — Z23 Encounter for immunization: Secondary | ICD-10-CM

## 2013-10-12 ENCOUNTER — Encounter: Payer: Self-pay | Admitting: Home Health Services

## 2013-10-12 NOTE — Progress Notes (Signed)
Addendum: I have reviewed this visit and discussed with Suzanne Lineberry and agree with her documentation.  

## 2013-10-12 NOTE — Progress Notes (Signed)
Patient here for annual wellness visit, patient reports: Risk Factors/Conditions needing evaluation or treatment: Pt. Has  25mm x 13mm sore on top of right foot.  Precepted with Dr. Erin Hearing.  Area showed no infection.  Recommended to patient to keep clean, elevate foot when sitting, and to loosen shoes.  Also to follow up with PCP in 2-4 weeks if sore worsened or did not heal.  Home Safety: Pt lives at home, with wife and step-daughter in a 1 story home.  Pt reports having smoke alarms and has adaptive equipment.  Other Information: Corrective lens: Pt does not wear corrective lens, has irregular eye exams. Completed retinal scan in office. Dentures: Pt has full upper dentures, has irregular dental exams. Memory: Pt denies memory problems.  Patient's Mini Mental Score (recorded in doc. flowsheet): 30 Bladder:  Pt reports some problems problems with bladder and bowel control.  BMI/Exercise: We discussed BMI and strategies for weight loss including portion sizes.  We also discussed  starting a regular exercise routine.  Med Adherence:  We discussed importance of taking medications for htn, cholesterol and dm .   ADL/IADL:  Pt reports independence in ADLS and some dependence in IADLS.  Balance/Gait: Pt reports 1 falls in the past year.  We discussed home safety and fall prevention.      Annual Wellness Visit Requirements Recorded Today In  Medical, family, social history Past Medical, Family, Social History Section  Current providers Care team  Current medications Medications  Wt, BP, Ht, BMI Vital signs  Tobacco, alcohol, illicit drug use History  ADL Nurse Assessment  Depression Screening Nurse Assessment  Cognitive impairment Nurse Assessment  Mini Mental Status Document Flowsheet  Fall Risk Fall/Depression  Home Safety Progress Note  End of Life Planning (welcome visit) Social Documentation  Medicare preventative services Progress Note  Risk factors/conditions needing  evaluation/treatment Progress Note  Personalized health advice Patient Instructions, goals, letter  Diet & Exercise Social Documentation  Emergency Contact Social Documentation  Seat Belts Social Documentation  Sun exposure/protection Social Documentation

## 2013-10-19 ENCOUNTER — Ambulatory Visit (INDEPENDENT_AMBULATORY_CARE_PROVIDER_SITE_OTHER): Payer: Medicare Other | Admitting: *Deleted

## 2013-10-19 ENCOUNTER — Other Ambulatory Visit: Payer: Self-pay | Admitting: Family Medicine

## 2013-10-19 DIAGNOSIS — Z8672 Personal history of thrombophlebitis: Secondary | ICD-10-CM

## 2013-10-19 DIAGNOSIS — Z7901 Long term (current) use of anticoagulants: Secondary | ICD-10-CM

## 2013-10-19 LAB — POCT INR: INR: 3.1

## 2013-10-19 MED ORDER — TORSEMIDE 20 MG PO TABS: ORAL_TABLET | ORAL | Status: DC

## 2013-10-19 MED ORDER — VENLAFAXINE HCL ER 37.5 MG PO CP24: ORAL_CAPSULE | ORAL | Status: DC

## 2013-10-19 MED ORDER — WARFARIN SODIUM 5 MG PO TABS: 5.0000 mg | ORAL_TABLET | Freq: Every day | ORAL | Status: DC

## 2013-10-19 MED ORDER — TAMSULOSIN HCL 0.4 MG PO CAPS
0.4000 mg | ORAL_CAPSULE | Freq: Every day | ORAL | Status: AC
Start: 2013-10-19 — End: ?

## 2013-10-19 MED ORDER — BISOPROLOL FUMARATE 5 MG PO TABS: 2.5000 mg | ORAL_TABLET | Freq: Two times a day (BID) | ORAL | Status: DC

## 2013-10-19 MED ORDER — INSULIN NPH (HUMAN) (ISOPHANE) 100 UNIT/ML ~~LOC~~ SUSP: 60.0000 [IU] | Freq: Every day | SUBCUTANEOUS | Status: DC

## 2013-10-19 MED ORDER — LISINOPRIL 10 MG PO TABS: 10.0000 mg | ORAL_TABLET | Freq: Every day | ORAL | Status: DC

## 2013-10-19 MED ORDER — SIMVASTATIN 80 MG PO TABS
ORAL_TABLET | ORAL | Status: DC
Start: 2013-10-19 — End: 2014-05-11

## 2013-10-19 MED ORDER — NITROGLYCERIN 0.4 MG SL SUBL: 0.4000 mg | SUBLINGUAL_TABLET | SUBLINGUAL | Status: DC | PRN

## 2013-10-19 MED ORDER — OMEPRAZOLE 40 MG PO CPDR: DELAYED_RELEASE_CAPSULE | ORAL | Status: DC

## 2013-10-19 MED ORDER — ONETOUCH ULTRA 2 W/DEVICE KIT: PACK | Status: DC

## 2013-11-15 ENCOUNTER — Ambulatory Visit (INDEPENDENT_AMBULATORY_CARE_PROVIDER_SITE_OTHER): Payer: Medicare Other | Admitting: *Deleted

## 2013-11-15 DIAGNOSIS — Z7901 Long term (current) use of anticoagulants: Secondary | ICD-10-CM

## 2013-11-15 DIAGNOSIS — Z8672 Personal history of thrombophlebitis: Secondary | ICD-10-CM

## 2013-11-15 LAB — POCT INR: INR: 1.9

## 2013-11-16 ENCOUNTER — Ambulatory Visit: Payer: Medicare Other

## 2013-11-25 ENCOUNTER — Other Ambulatory Visit: Payer: Self-pay | Admitting: Family Medicine

## 2013-11-25 MED ORDER — LORAZEPAM 0.5 MG PO TABS
ORAL_TABLET | ORAL | Status: DC
Start: 2013-11-25 — End: 2013-12-09

## 2013-11-26 ENCOUNTER — Other Ambulatory Visit: Payer: Self-pay | Admitting: Family Medicine

## 2013-12-09 ENCOUNTER — Ambulatory Visit (INDEPENDENT_AMBULATORY_CARE_PROVIDER_SITE_OTHER): Payer: Medicare Other | Admitting: Family Medicine

## 2013-12-09 ENCOUNTER — Ambulatory Visit (INDEPENDENT_AMBULATORY_CARE_PROVIDER_SITE_OTHER): Payer: Medicare Other | Admitting: *Deleted

## 2013-12-09 VITALS — BP 111/51 | HR 67 | Temp 97.9°F | Wt 268.0 lb

## 2013-12-09 DIAGNOSIS — M25559 Pain in unspecified hip: Secondary | ICD-10-CM

## 2013-12-09 DIAGNOSIS — M7500 Adhesive capsulitis of unspecified shoulder: Secondary | ICD-10-CM

## 2013-12-09 DIAGNOSIS — W19XXXS Unspecified fall, sequela: Secondary | ICD-10-CM

## 2013-12-09 DIAGNOSIS — Z7901 Long term (current) use of anticoagulants: Secondary | ICD-10-CM

## 2013-12-09 DIAGNOSIS — F3289 Other specified depressive episodes: Secondary | ICD-10-CM

## 2013-12-09 DIAGNOSIS — F329 Major depressive disorder, single episode, unspecified: Secondary | ICD-10-CM

## 2013-12-09 DIAGNOSIS — R600 Localized edema: Secondary | ICD-10-CM

## 2013-12-09 DIAGNOSIS — M25552 Pain in left hip: Secondary | ICD-10-CM

## 2013-12-09 DIAGNOSIS — R609 Edema, unspecified: Secondary | ICD-10-CM

## 2013-12-09 DIAGNOSIS — M7502 Adhesive capsulitis of left shoulder: Secondary | ICD-10-CM

## 2013-12-09 DIAGNOSIS — Z8672 Personal history of thrombophlebitis: Secondary | ICD-10-CM

## 2013-12-09 DIAGNOSIS — E1165 Type 2 diabetes mellitus with hyperglycemia: Secondary | ICD-10-CM

## 2013-12-09 DIAGNOSIS — IMO0001 Reserved for inherently not codable concepts without codable children: Secondary | ICD-10-CM

## 2013-12-09 DIAGNOSIS — IMO0002 Reserved for concepts with insufficient information to code with codable children: Secondary | ICD-10-CM

## 2013-12-09 DIAGNOSIS — T7589XS Other specified effects of external causes, sequela: Secondary | ICD-10-CM

## 2013-12-09 DIAGNOSIS — Z23 Encounter for immunization: Secondary | ICD-10-CM

## 2013-12-09 LAB — POCT GLYCOSYLATED HEMOGLOBIN (HGB A1C): HEMOGLOBIN A1C: 8.7

## 2013-12-09 LAB — POCT INR: INR: 1.9

## 2013-12-09 MED ORDER — ACETAMINOPHEN-CODEINE #4 300-60 MG PO TABS: 1.0000 | ORAL_TABLET | Freq: Four times a day (QID) | ORAL | Status: DC | PRN

## 2013-12-09 MED ORDER — LORAZEPAM 0.5 MG PO TABS: ORAL_TABLET | ORAL | Status: DC

## 2013-12-09 MED ORDER — INSULIN NPH (HUMAN) (ISOPHANE) 100 UNIT/ML ~~LOC~~ SUSP: 35.0000 [IU] | Freq: Two times a day (BID) | SUBCUTANEOUS | Status: DC

## 2013-12-09 NOTE — Progress Notes (Signed)
Subjective:    [name] is a 73 y.o. male who presents to [facility] today for several issues:  1. Diabetes:  Currently on Humulin 60 units once a day.  Pretty persistent elevation in A1C greater than 8.  States he's compliant "most days."  No adverse effects from medication.  No hypoglycemic events.  No paresthesia or peripheral nerve pain.  Measures blood sugars at home every: day, running 170 - 200.  Lab Results  Component Value Date   HGBA1C 8.7 12/09/2013   2.  Left hip pain:  Persists.  Has had trouble with this previously, has had replacement.  Pain radiates past knee occasionally.  Walks with cane (has done so for years).  No falls.     ROS as above per HPI, otherwise neg.  Pertinently, no chest pain, palpitations, SOB, Fever, Chills, Abd pain, N/V/D.   The following portions of the patient's history were reviewed and updated as appropriate: allergies, current medications, past medical history, family and social history, and problem list. Patient is a nonsmoker.    PMH reviewed.  Past Medical History  Diagnosis Date  . Depression   . Diabetes mellitus   . COPD (chronic obstructive pulmonary disease)   . Adhesive capsulitis   . MRSA bacteremia     2011 - possible endocarditis, received 6 weeks IV treatment  . Hyperlipidemia   . Anxiety   . GERD (gastroesophageal reflux disease)   . CAD (coronary artery disease)   . 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 CVA (cerebral infarction) Questionable history  . Diverticulosis   . Osteomyelitis 2012    Sternoclavicular joint   . Chronic back pain   . Angina   . Myocardial infarction   . CHF (congestive heart failure)   . Shortness of breath   . Sleep apnea   . Chronic kidney disease     hx of BPH  . Neuromuscular disorder     HX of diabetic periferal neuropathy  . Hypertension   . Peripheral vascular disease    Past Surgical History  Procedure Laterality Date  . Septic arthritis      Removal of  infected CABG wire by Dr Arlyce Dice - 2011  . Coronary artery bypass graft  1992  . Fracture surgery  1980s    Hip  . Spine surgery  2002    Cervical fusion vertebroplasty C3-4-5  . Coronary stent placement  1999, 2002  . Doppler echocardiography  Sept 2011    EF 50-55% with some impaired diastolic relaxation  . Colonoscopy  2006     Multiple polyps removed, repeat in 5 years  . Right iliopopliteal bypass - [place]  . Femoral-popliteal bypass graft  2008    Left  . Cardiac catheterization  October 18, 2010    Known obstructive disease, no further blockages  . Cardiac catheterization  03/17/2005    EF 35-40%  . US echocardiography  03/04/2006    EF 50-55%  . Amputation  03/16/2011    Procedure: AMPUTATION DIGIT;  Surgeon: Angelia Mould, MD;  Location: [facility] OR;  Service: Vascular;  Laterality: Left;  Great toe    Medications reviewed. Current Outpatient Prescriptions  Medication Sig Dispense Refill  . acetaminophen-codeine (TYLENOL #4) 300-60 MG per tablet Take 1 tablet by mouth every 6 (six) hours as needed.  60 tablet  1  . aspirin (BABY ASPIRIN) 81 MG chewable tablet Chew 81 mg by mouth daily.       Marland Kitchen  bisoprolol (ZEBETA) 5 MG tablet Take 0.5 tablets (2.5 mg total) by mouth 2 (two) times daily.  90 tablet  3  . Blood Glucose Monitoring Suppl (ONE TOUCH ULTRA 2) W/DEVICE KIT Please supply patient with 1 glucometer device to use 4 times daily to check blood sugars.  Dx code: 250.0  1 each  1  . glucose blood test strip Use as instructed  100 each  12  . HYDROcodone-acetaminophen (NORCO) 5-325 MG per tablet Take 1 tablet by mouth every 6 (six) hours as needed for moderate pain.  30 tablet  0  . insulin NPH Human (HUMULIN N,NOVOLIN N) 100 UNIT/ML injection Inject 0.6 mLs (60 Units total) into the skin daily before breakfast.  10 mL  11  . lisinopril (PRINIVIL,ZESTRIL) 10 MG tablet Take 1 tablet (10 mg total) by mouth daily.  90 tablet  2  . LORazepam (ATIVAN) 0.5 MG tablet TAKE ONE  TABLET BY MOUTH AT BEDTIME AS NEEDED FOR ANXIETY  14 tablet  0  . nitroGLYCERIN (NITROSTAT) 0.4 MG SL tablet Place 1 tablet (0.4 mg total) under the tongue as needed. For chest pains  30 tablet  6  . omeprazole (PRILOSEC) 40 MG capsule TAKE ONE CAPSULE BY MOUTH ONCE DAILY  30 capsule  6  . omeprazole (PRILOSEC) 40 MG capsule TAKE ONE CAPSULE BY MOUTH ONCE DAILY  30 capsule  3  . QUEtiapine (SEROQUEL) 50 MG tablet Take 1 tablet (50 mg total) by mouth at bedtime.  30 tablet  3  . simvastatin (ZOCOR) 80 MG tablet TAKE ONE TABLET BY MOUTH AT BEDTIME  90 tablet  3  . tamsulosin (FLOMAX) 0.4 MG CAPS capsule Take 1 capsule (0.4 mg total) by mouth daily after supper.  90 capsule  1  . torsemide (DEMADEX) 20 MG tablet TAKE ONE TABLET BY MOUTH EVERY DAY  60 tablet  2  . traMADol (ULTRAM) 50 MG tablet TAKE ONE TO TWO TABLETS BY MOUTH EVERY 8 HOURS AS NEEDED  30 tablet  1  . venlafaxine XR (EFFEXOR-XR) 37.5 MG 24 hr capsule TAKE ONE CAPSULE BY MOUTH ONCE DAILY  30 capsule  11  . warfarin (COUMADIN) 5 MG tablet Take 1-1.5 tablets (5-7.5 mg total) by mouth daily. Take 1 tablet (57m) daily except Sunday then take 1 1/2 tablets (7.529m  60 tablet  3  . zoster vaccine live, PF, (ZOSTAVAX) 1971219NT/0.65ML injection Inject 19,400 Units into the skin once.  1 each  0   No current facility-administered medications for this visit.     Objective:   Physical Exam BP 111/51  Pulse 67  Temp(Src) 97.9 F (36.6 C) (Oral)  Wt 268 lb (121.564 kg)  SpO2 94% Gen:  Alert, cooperative patient who appears stated age in no acute distress.  Vital signs reviewed. HEENT: EOMI,  MMM Cardiac:  Regular rate and rhythm without murmur auscultated. Pulm:  Crackles at bases, (chronic for him)   MSK:  TTP directly over greater trochanter.  Some mild pain with internal rotation of Left hip.  Ext:  +1 shins.  Has 0.5 cm in diameter wound anterior Left shin.  Put abx ointment and dressing on this.     Results for orders placed in  visit on 12/09/13 (from the past 72 hour(s))  POCT GLYCOSYLATED HEMOGLOBIN (HGB A1C)     Status: Abnormal   Collection Time    12/09/13 10:21 AM      Result Value Ref Range   Hemoglobin A1C 8.7

## 2013-12-09 NOTE — Assessment & Plan Note (Signed)
Continue Effexor.  THis has helped. Has also had help with Ativan.   Age noted -- he has done well with this in past.  Benefit of lack of marital discord, decreased irritation, and decreased home crying spells outweighs risk of falls -- esp since he is doing better from fall standpoint.  Discussed with Ed and he agrees.

## 2013-12-09 NOTE — Assessment & Plan Note (Signed)
Wants to wait on a replacement Refill tylenol #4.

## 2013-12-09 NOTE — Assessment & Plan Note (Signed)
Switch to BID dosing.  Insulin resistance an issue. Increase to Humulin 35 units BID.   FU in 2 months for recheck.

## 2013-12-09 NOTE — Assessment & Plan Note (Signed)
Minor wound, dressed in clinic today.

## 2013-12-09 NOTE — Assessment & Plan Note (Signed)
None within several months.  Warning provided with refill of Ativan today.

## 2013-12-09 NOTE — Patient Instructions (Signed)
We switched your insulin to 35 units in the AM and 35 units in the PM.  Keep checking your blood sugars.  Refill for anxiety and pain medicine.   It was good to see you.  If you have any chest pain that doesn't get better with nitro, come back or go to the hospital.    Come back and see me right before Christmas.  If you're having problems come back sooner.

## 2013-12-09 NOTE — Assessment & Plan Note (Signed)
Tylenol #4 for relief. Declines physical therapy Recommend to continue to walk and do home exercises as much as possible (also to help with hip).

## 2013-12-14 ENCOUNTER — Ambulatory Visit: Payer: Medicare Other

## 2013-12-30 ENCOUNTER — Ambulatory Visit (INDEPENDENT_AMBULATORY_CARE_PROVIDER_SITE_OTHER): Payer: Medicare Other | Admitting: *Deleted

## 2013-12-30 DIAGNOSIS — Z7901 Long term (current) use of anticoagulants: Secondary | ICD-10-CM

## 2013-12-30 DIAGNOSIS — Z8672 Personal history of thrombophlebitis: Secondary | ICD-10-CM

## 2013-12-30 LAB — POCT INR: INR: 4.4

## 2014-01-09 ENCOUNTER — Ambulatory Visit (INDEPENDENT_AMBULATORY_CARE_PROVIDER_SITE_OTHER): Payer: Medicare Other | Admitting: *Deleted

## 2014-01-09 DIAGNOSIS — Z7901 Long term (current) use of anticoagulants: Secondary | ICD-10-CM

## 2014-01-09 DIAGNOSIS — Z8672 Personal history of thrombophlebitis: Secondary | ICD-10-CM

## 2014-01-09 LAB — POCT INR: INR: 2.6

## 2014-01-10 ENCOUNTER — Other Ambulatory Visit: Payer: Self-pay | Admitting: Family Medicine

## 2014-01-10 MED ORDER — ACETAMINOPHEN-CODEINE #4 300-60 MG PO TABS: 1.0000 | ORAL_TABLET | Freq: Four times a day (QID) | ORAL | Status: DC | PRN

## 2014-01-20 ENCOUNTER — Telehealth: Payer: Self-pay | Admitting: Pharmacist

## 2014-01-20 ENCOUNTER — Encounter: Payer: Self-pay | Admitting: Family Medicine

## 2014-01-20 ENCOUNTER — Ambulatory Visit (INDEPENDENT_AMBULATORY_CARE_PROVIDER_SITE_OTHER): Payer: Medicare Other | Admitting: Family Medicine

## 2014-01-20 VITALS — BP 117/40 | HR 69 | Temp 98.5°F | Ht 68.0 in | Wt 276.0 lb

## 2014-01-20 DIAGNOSIS — I5022 Chronic systolic (congestive) heart failure: Secondary | ICD-10-CM

## 2014-01-20 DIAGNOSIS — F411 Generalized anxiety disorder: Secondary | ICD-10-CM

## 2014-01-20 DIAGNOSIS — Z9181 History of falling: Secondary | ICD-10-CM

## 2014-01-20 DIAGNOSIS — R296 Repeated falls: Secondary | ICD-10-CM

## 2014-01-20 MED ORDER — BISOPROLOL FUMARATE 5 MG PO TABS: 2.5000 mg | ORAL_TABLET | Freq: Every day | ORAL | Status: DC

## 2014-01-20 MED ORDER — HYDROCODONE-ACETAMINOPHEN 10-325 MG PO TABS: 1.0000 | ORAL_TABLET | Freq: Three times a day (TID) | ORAL | Status: DC | PRN

## 2014-01-20 MED ORDER — LORAZEPAM 0.5 MG PO TABS: ORAL_TABLET | ORAL | Status: DC

## 2014-01-20 MED ORDER — "NEEDLE (DISP) 30G X 1"" MISC": Status: DC

## 2014-01-20 MED ORDER — TORSEMIDE 20 MG PO TABS: ORAL_TABLET | ORAL | Status: DC

## 2014-01-20 MED ORDER — "INSULIN SYRINGE-NEEDLE U-100 30G X 1/2"" 0.5 ML MISC": Status: DC

## 2014-01-20 NOTE — Assessment & Plan Note (Signed)
See anxiety above.

## 2014-01-20 NOTE — Assessment & Plan Note (Signed)
Does not have his meter with him.  Need to switch to Humulin N based on insurance.  Discussed with Nicoletta Ba who will call pharmacy to clarify.

## 2014-01-20 NOTE — Assessment & Plan Note (Signed)
Now with weight gain.  Out of diuretics No chest pain or dyspnea at rest or with walking, only lying flat. Refilled torsemide with short term increase over the weekend or until his weight drops.  If no improvement, FU with me next week.

## 2014-01-20 NOTE — Progress Notes (Signed)
Subjective:    [name] is a 73 y.o. male who presents to [facility] today for 2 concerns:  1.  Shoulder pain:  Worse than usual.  Has had him "in tears" some days.  Really bad past 1-2 weeks.  BL shoulders.  Pain when sleeping or laying back.  Feels codeine no longer helping.  Would like referral to orthopedist for possible surgical options.  No recent trauma, no swelling, no redness. No chest pain.   2.  Diabetes:  Currently on Novolin N.  Will be switched to Humulin by insurance.   No adverse effects from medication.  No hypoglycemic events.  No paresthesia or peripheral nerve pain.  Measures blood sugars at home every:   Day or so, though doesn't bring meter today.  Lab Results  Component Value Date   HGBA1C 8.7 12/09/2013   3.  Anxiety:  Another main issue for [name] now.  Has always been "nervous" and was previously on Xanax before becoming my patient.  He has done fairly well with Ativan in past.  He is now out of this.  Also on SSRI and has been for some time.    4.  Weight gain:  Noted about 6-8 lb weight gain on home scales.  Has been out of his Torsemide for at least 2 days.  Dyspnea when lying back.  None when walking or upright.  No change in leg edema.   ROS as above per HPI, otherwise neg.   The following portions of the patient's history were reviewed and updated as appropriate: allergies, current medications, past medical history, family and social history, and problem list. Patient is a nonsmoker.    PMH reviewed.  Past Medical History  Diagnosis Date  . Depression   . Diabetes mellitus   . COPD (chronic obstructive pulmonary disease)   . Adhesive capsulitis   . MRSA bacteremia     2011 - possible endocarditis, received 6 weeks IV treatment  . Hyperlipidemia   . Anxiety   . GERD (gastroesophageal reflux disease)   . CAD (coronary artery disease)   . 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 CVA (cerebral infarction) Questionable history  .  Diverticulosis   . Osteomyelitis 2012    Sternoclavicular joint   . Chronic back pain   . Angina   . Myocardial infarction   . CHF (congestive heart failure)   . Shortness of breath   . Sleep apnea   . Chronic kidney disease     hx of BPH  . Neuromuscular disorder     HX of diabetic periferal neuropathy  . Hypertension   . Peripheral vascular disease    Past Surgical History  Procedure Laterality Date  . Septic arthritis      Removal of infected CABG wire by Dr Arlyce Dice - 2011  . Coronary artery bypass graft  1992  . Fracture surgery  1980s    Hip  . Spine surgery  2002    Cervical fusion vertebroplasty C3-4-5  . Coronary stent placement  1999, 2002  . Doppler echocardiography  Sept 2011    EF 50-55% with some impaired diastolic relaxation  . Colonoscopy  2006     Multiple polyps removed, repeat in 5 years  . Right iliopopliteal bypass - [place]  . Femoral-popliteal bypass graft  2008    Left  . Cardiac catheterization  October 18, 2010    Known obstructive disease, no further blockages  .  Cardiac catheterization  03/17/2005    EF 35-40%  . US echocardiography  03/04/2006    EF 50-55%  . 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 Current Outpatient Prescriptions  Medication Sig Dispense Refill  . acetaminophen-codeine (TYLENOL #4) 300-60 MG per tablet Take 1 tablet by mouth every 6 (six) hours as needed.  60 tablet  1  . aspirin (BABY ASPIRIN) 81 MG chewable tablet Chew 81 mg by mouth daily.       . bisoprolol (ZEBETA) 5 MG tablet Take 0.5 tablets (2.5 mg total) by mouth 2 (two) times daily.  90 tablet  3  . Blood Glucose Monitoring Suppl (ONE TOUCH ULTRA 2) W/DEVICE KIT Please supply patient with 1 glucometer device to use 4 times daily to check blood sugars.  Dx code: 250.0  1 each  1  . glucose blood test strip Use as  instructed  100 each  12  . insulin NPH Human (HUMULIN N,NOVOLIN N) 100 UNIT/ML injection Inject 0.35 mLs (35 Units total) into the skin 2 (two) times daily before a meal.  10 mL  11  . lisinopril (PRINIVIL,ZESTRIL) 10 MG tablet Take 1 tablet (10 mg total) by mouth daily.  90 tablet  2  . LORazepam (ATIVAN) 0.5 MG tablet TAKE ONE TABLET BY MOUTH AT BEDTIME AS NEEDED FOR ANXIETY  45 tablet  1  . nitroGLYCERIN (NITROSTAT) 0.4 MG SL tablet Place 1 tablet (0.4 mg total) under the tongue as needed. For chest pains  30 tablet  6  . omeprazole (PRILOSEC) 40 MG capsule TAKE ONE CAPSULE BY MOUTH ONCE DAILY  30 capsule  6  . omeprazole (PRILOSEC) 40 MG capsule TAKE ONE CAPSULE BY MOUTH ONCE DAILY  30 capsule  3  . QUEtiapine (SEROQUEL) 50 MG tablet Take 1 tablet (50 mg total) by mouth at bedtime.  30 tablet  3  . simvastatin (ZOCOR) 80 MG tablet TAKE ONE TABLET BY MOUTH AT BEDTIME  90 tablet  3  . tamsulosin (FLOMAX) 0.4 MG CAPS capsule Take 1 capsule (0.4 mg total) by mouth daily after supper.  90 capsule  1  . torsemide (DEMADEX) 20 MG tablet TAKE ONE TABLET BY MOUTH EVERY DAY  60 tablet  2  . traMADol (ULTRAM) 50 MG tablet TAKE ONE TO TWO TABLETS BY MOUTH EVERY 8 HOURS AS NEEDED  30 tablet  1  . venlafaxine XR (EFFEXOR-XR) 37.5 MG 24 hr capsule TAKE ONE CAPSULE BY MOUTH ONCE DAILY  30 capsule  11  . warfarin (COUMADIN) 5 MG tablet Take 1-1.5 tablets (5-7.5 mg total) by mouth daily. Take 1 tablet (72m) daily except Sunday then take 1 1/2 tablets (7.580m  60 tablet  3  . zoster vaccine live, PF, (ZOSTAVAX) [id number]/0.65ML injection Inject 19,400 Units into the skin once.  1 each  0   No current facility-administered medications for this visit.     Objective:   Physical Exam BP 117/40  Pulse 69  Temp(Src) 98.5 F (36.9 C) (Oral)  Ht '5\' 8"'  (1.727 m)  Wt 276 lb (125.193 kg)  BMI 41.98 kg/m2  SpO2 91% Gen:  Alert, cooperative patient who appears stated age in no acute distress.  Vital signs  reviewed. HEENT: EOMI,  MMM Cardiac:  Regular rate and rhythm  Pulm:  Clear to auscultation without crackles.  Abd:  Soft/nondistended/NT.  Obese MSK:  TTP along shoulders BL.  Limited to about 50 degrees of abduction BL by pain.  NO redness, swelling, warmth, skin changes.   Exts: stable chronic lower venous insuff changes and +3 leg edema.  No results found for this or any previous visit (from the past 72 hour(s)).

## 2014-01-20 NOTE — Assessment & Plan Note (Signed)
Major life limiting issue for [name]. Will refill his Ativan.  Fall precautions given.  Understands risks and benefits.

## 2014-01-20 NOTE — Assessment & Plan Note (Signed)
Has been taking bisoporolol twice daily for some reason. Switched back to daily.

## 2014-01-20 NOTE — Assessment & Plan Note (Signed)
Also with Left invovlement. Short term course of norco for relief, then switch back to codeine.  Think PT best bet.   Desires referral to ortho, will place today.

## 2014-01-20 NOTE — Telephone Encounter (Signed)
Per Dr. Mingo Amber, patient received a letter from [facility] telling him that he would be switched from his current insulin to Humulin N.   Called patient to clarify what type of insulin he was on. Patient was unsure.  [place], patient is on Novolin N, last filled 12/09/13.  Patient's insurance will prefer Humulin N in the future.   Also ordered needles and syringes for patient's insulin administration

## 2014-01-20 NOTE — Patient Instructions (Addendum)
Take 2 pills of fluid pill Torsemide for next three days.  On Monday drop back down to 1 a day.  Go pick up your insulin, syringes, and test strips this PM.  I'll have the pharmacist figure this out.  Short-term increase in pain medicine.  I will refer you back to Ortho for your shoulder.    See me in about 3 months or sooner if your weight doesn't improve.

## 2014-01-29 ENCOUNTER — Other Ambulatory Visit: Payer: Self-pay | Admitting: Family Medicine

## 2014-02-06 ENCOUNTER — Ambulatory Visit (INDEPENDENT_AMBULATORY_CARE_PROVIDER_SITE_OTHER): Payer: Medicare Other | Admitting: *Deleted

## 2014-02-06 ENCOUNTER — Other Ambulatory Visit: Payer: Self-pay | Admitting: Family Medicine

## 2014-02-06 DIAGNOSIS — Z7901 Long term (current) use of anticoagulants: Secondary | ICD-10-CM

## 2014-02-06 LAB — POCT INR: INR: 3.3

## 2014-02-06 MED ORDER — GLUCOSE BLOOD VI STRP: ORAL_STRIP | Status: DC

## 2014-02-08 ENCOUNTER — Emergency Department (HOSPITAL_COMMUNITY): Payer: Medicare Other

## 2014-02-08 ENCOUNTER — Encounter (HOSPITAL_COMMUNITY): Payer: Self-pay | Admitting: Emergency Medicine

## 2014-02-08 ENCOUNTER — Emergency Department (HOSPITAL_COMMUNITY)
Admission: EM | Admit: 2014-02-08 | Discharge: 2014-02-09 | Disposition: A | Discharge status: Home / Self Care | Payer: Medicare Other | Attending: Emergency Medicine | Admitting: Emergency Medicine

## 2014-02-08 DIAGNOSIS — K219 Gastro-esophageal reflux disease without esophagitis: Secondary | ICD-10-CM | POA: Diagnosis not present

## 2014-02-08 DIAGNOSIS — M6281 Muscle weakness (generalized): Secondary | ICD-10-CM | POA: Insufficient documentation

## 2014-02-08 DIAGNOSIS — I129 Hypertensive chronic kidney disease with stage 1 through stage 4 chronic kidney disease, or unspecified chronic kidney disease: Secondary | ICD-10-CM | POA: Diagnosis not present

## 2014-02-08 DIAGNOSIS — Y998 Other external cause status: Secondary | ICD-10-CM | POA: Diagnosis not present

## 2014-02-08 DIAGNOSIS — Z9861 Coronary angioplasty status: Secondary | ICD-10-CM | POA: Diagnosis not present

## 2014-02-08 DIAGNOSIS — S0181XA Laceration without foreign body of other part of head, initial encounter: Secondary | ICD-10-CM | POA: Diagnosis not present

## 2014-02-08 DIAGNOSIS — Z8614 Personal history of Methicillin resistant Staphylococcus aureus infection: Secondary | ICD-10-CM | POA: Insufficient documentation

## 2014-02-08 DIAGNOSIS — F329 Major depressive disorder, single episode, unspecified: Secondary | ICD-10-CM | POA: Diagnosis not present

## 2014-02-08 DIAGNOSIS — Z794 Long term (current) use of insulin: Secondary | ICD-10-CM | POA: Diagnosis not present

## 2014-02-08 DIAGNOSIS — J449 Chronic obstructive pulmonary disease, unspecified: Secondary | ICD-10-CM | POA: Diagnosis not present

## 2014-02-08 DIAGNOSIS — I739 Peripheral vascular disease, unspecified: Secondary | ICD-10-CM | POA: Diagnosis not present

## 2014-02-08 DIAGNOSIS — F419 Anxiety disorder, unspecified: Secondary | ICD-10-CM | POA: Insufficient documentation

## 2014-02-08 DIAGNOSIS — W1839XA Other fall on same level, initial encounter: Secondary | ICD-10-CM | POA: Insufficient documentation

## 2014-02-08 DIAGNOSIS — W19XXXA Unspecified fall, initial encounter: Secondary | ICD-10-CM

## 2014-02-08 DIAGNOSIS — Z86711 Personal history of pulmonary embolism: Secondary | ICD-10-CM | POA: Diagnosis not present

## 2014-02-08 DIAGNOSIS — G8929 Other chronic pain: Secondary | ICD-10-CM | POA: Diagnosis not present

## 2014-02-08 DIAGNOSIS — Y92009 Unspecified place in unspecified non-institutional (private) residence as the place of occurrence of the external cause: Secondary | ICD-10-CM | POA: Diagnosis not present

## 2014-02-08 DIAGNOSIS — R52 Pain, unspecified: Secondary | ICD-10-CM

## 2014-02-08 DIAGNOSIS — I252 Old myocardial infarction: Secondary | ICD-10-CM | POA: Insufficient documentation

## 2014-02-08 DIAGNOSIS — E1142 Type 2 diabetes mellitus with diabetic polyneuropathy: Secondary | ICD-10-CM | POA: Insufficient documentation

## 2014-02-08 DIAGNOSIS — S0993XA Unspecified injury of face, initial encounter: Secondary | ICD-10-CM | POA: Diagnosis present

## 2014-02-08 DIAGNOSIS — Y9389 Activity, other specified: Secondary | ICD-10-CM | POA: Insufficient documentation

## 2014-02-08 DIAGNOSIS — Z7982 Long term (current) use of aspirin: Secondary | ICD-10-CM | POA: Diagnosis not present

## 2014-02-08 DIAGNOSIS — Z79899 Other long term (current) drug therapy: Secondary | ICD-10-CM | POA: Diagnosis not present

## 2014-02-08 DIAGNOSIS — E785 Hyperlipidemia, unspecified: Secondary | ICD-10-CM | POA: Insufficient documentation

## 2014-02-08 DIAGNOSIS — I509 Heart failure, unspecified: Secondary | ICD-10-CM | POA: Diagnosis not present

## 2014-02-08 DIAGNOSIS — R531 Weakness: Secondary | ICD-10-CM

## 2014-02-08 DIAGNOSIS — Z951 Presence of aortocoronary bypass graft: Secondary | ICD-10-CM | POA: Diagnosis not present

## 2014-02-08 DIAGNOSIS — Z87891 Personal history of nicotine dependence: Secondary | ICD-10-CM | POA: Diagnosis not present

## 2014-02-08 DIAGNOSIS — N400 Enlarged prostate without lower urinary tract symptoms: Secondary | ICD-10-CM | POA: Insufficient documentation

## 2014-02-08 DIAGNOSIS — Z7901 Long term (current) use of anticoagulants: Secondary | ICD-10-CM | POA: Insufficient documentation

## 2014-02-08 DIAGNOSIS — N189 Chronic kidney disease, unspecified: Secondary | ICD-10-CM | POA: Diagnosis not present

## 2014-02-08 DIAGNOSIS — Z8673 Personal history of transient ischemic attack (TIA), and cerebral infarction without residual deficits: Secondary | ICD-10-CM | POA: Insufficient documentation

## 2014-02-08 DIAGNOSIS — Z9889 Other specified postprocedural states: Secondary | ICD-10-CM | POA: Diagnosis not present

## 2014-02-08 DIAGNOSIS — I2510 Atherosclerotic heart disease of native coronary artery without angina pectoris: Secondary | ICD-10-CM | POA: Insufficient documentation

## 2014-02-08 LAB — COMPREHENSIVE METABOLIC PANEL
ALK PHOS: 70 U/L (ref 39–117)
ALT: 14 U/L (ref 0–53)
AST: 17 U/L (ref 0–37)
Albumin: 3.6 g/dL (ref 3.5–5.2)
Anion gap: 16 — ABNORMAL HIGH (ref 5–15)
BILIRUBIN TOTAL: 0.3 mg/dL (ref 0.3–1.2)
BUN: 17 mg/dL (ref 6–23)
CO2: 21 meq/L (ref 19–32)
CREATININE: 1.17 mg/dL (ref 0.50–1.35)
Calcium: 8.8 mg/dL (ref 8.4–10.5)
Chloride: 99 mEq/L (ref 96–112)
GFR calc non Af Amer: 59 mL/min — ABNORMAL LOW (ref >90)
GFR, EST AFRICAN AMERICAN: 69 mL/min — AB (ref >90)
Glucose, Bld: 279 mg/dL — ABNORMAL HIGH (ref 70–99)
Potassium: 4.3 mEq/L (ref 3.7–5.3)
SODIUM: 136 meq/L — AB (ref 137–147)
TOTAL PROTEIN: 6.9 g/dL (ref 6.0–8.3)

## 2014-02-08 LAB — PROTIME-INR
INR: 1.69 — AB (ref 0.00–1.49)
PROTHROMBIN TIME: 20.1 s — AB (ref 11.6–15.2)

## 2014-02-08 LAB — CBC WITH DIFFERENTIAL/PLATELET
BASOS ABS: 0 10*3/uL (ref 0.0–0.1)
BASOS PCT: 0 % (ref 0–1)
EOS ABS: 0.2 10*3/uL (ref 0.0–0.7)
Eosinophils Relative: 3 % (ref 0–5)
HEMATOCRIT: 40.9 % (ref 39.0–52.0)
Hemoglobin: 13.4 g/dL (ref 13.0–17.0)
Lymphocytes Relative: 19 % (ref 12–46)
Lymphs Abs: 1.8 10*3/uL (ref 0.7–4.0)
MCH: 26.6 pg (ref 26.0–34.0)
MCHC: 32.8 g/dL (ref 30.0–36.0)
MCV: 81.3 fL (ref 78.0–100.0)
MONO ABS: 0.9 10*3/uL (ref 0.1–1.0)
Monocytes Relative: 10 % (ref 3–12)
NEUTROS ABS: 6.6 10*3/uL (ref 1.7–7.7)
NEUTROS PCT: 68 % (ref 43–77)
Platelets: 168 10*3/uL (ref 150–400)
RBC: 5.03 MIL/uL (ref 4.22–5.81)
RDW: 16.2 % — AB (ref 11.5–15.5)
WBC: 9.5 10*3/uL (ref 4.0–10.5)

## 2014-02-08 LAB — I-STAT TROPONIN, ED: TROPONIN I, POC: 0.01 ng/mL (ref 0.00–0.08)

## 2014-02-08 MED ORDER — HYDROCODONE-ACETAMINOPHEN 5-325 MG PO TABS
2.0000 | ORAL_TABLET | Freq: Once | ORAL | Status: AC
Start: 2014-02-08 — End: 2014-02-08
  Administered 2014-02-08: 2 via ORAL
  Filled 2014-02-08: qty 2

## 2014-02-08 NOTE — ED Notes (Signed)
Pt reports falling around 2000 tonight and remembers the fall with no loss of conciousness.  Pt on blood thinners.  Pt has felt dizzy off and on since today.  Pt fell on face,laceration to right chin and pain in left shoulder.

## 2014-02-08 NOTE — ED Notes (Addendum)
Pt reports dizziness today and fell face first about 30 mins ago. Nose and chin trauma. Hurting all over. Bruising to left shoulder and reported pain. Full ROM currently. On blood thinners.

## 2014-02-09 LAB — URINALYSIS, ROUTINE W REFLEX MICROSCOPIC
Bilirubin Urine: NEGATIVE
Glucose, UA: 500 mg/dL — AB
Hgb urine dipstick: NEGATIVE
Ketones, ur: NEGATIVE mg/dL
Leukocytes, UA: NEGATIVE
Nitrite: NEGATIVE
Protein, ur: NEGATIVE mg/dL
Specific Gravity, Urine: 1.022 (ref 1.005–1.030)
Urobilinogen, UA: 1 mg/dL (ref 0.0–1.0)
pH: 6 (ref 5.0–8.0)

## 2014-02-09 MED ORDER — HYDROMORPHONE HCL 1 MG/ML IJ SOLN
1.0000 mg | Freq: Once | INTRAMUSCULAR | Status: AC
  Administered 2014-02-09: 1 mg via INTRAMUSCULAR
  Filled 2014-02-09: qty 1

## 2014-02-09 NOTE — ED Provider Notes (Signed)
CSN: [id number]     Arrival date & time 02/08/14  2034 History   First MD Initiated Contact with Patient 02/08/14 2302     Chief Complaint  Patient presents with  . Dizziness  . Fall     (Consider location/radiation/quality/duration/timing/severity/associated sxs/prior Treatment) HPI 73 year old male presents to the emergency department from home after a fall.  Patient reports he was feeling dizzy and weak and fell, landing on a chair and a table before hitting the floor.  He denies any LOC.  Patient complains of diffuse pain all over his body.  Patient reports he's been feeling dizzy and weak throughout the day, having to take his time when walking.  He reports he has had similar problems in the past.  Patient is on Coumadin.  He was seen on Monday and had too high.  Coumadin.  INR 3.3 and told to hold his Coumadin.  He denies any LOC, no dizziness, no nausea no vomiting since the injury.  He has a laceration to his chin and a bruise to his left shoulder. Past Medical History  Diagnosis Date  . Depression   . Diabetes mellitus   . COPD (chronic obstructive pulmonary disease)   . Adhesive capsulitis   . MRSA bacteremia     2011 - possible endocarditis, received 6 weeks IV treatment  . Hyperlipidemia   . Anxiety   . GERD (gastroesophageal reflux disease)   . CAD (coronary artery disease)   . 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 CVA (cerebral infarction) Questionable history  . Diverticulosis   . Osteomyelitis 2012    Sternoclavicular joint   . Chronic back pain   . Angina   . Myocardial infarction   . CHF (congestive heart failure)   . Shortness of breath   . Sleep apnea   . Chronic kidney disease     hx of BPH  . Neuromuscular disorder     HX of diabetic periferal neuropathy  . Hypertension   . Peripheral vascular disease    Past Surgical History  Procedure Laterality Date  . Septic arthritis      Removal of infected CABG wire by Dr Arlyce Dice -  2011  . Coronary artery bypass graft  1992  . Fracture surgery  1980s    Hip  . Spine surgery  2002    Cervical fusion vertebroplasty C3-4-5  . Coronary stent placement  1999, 2002  . Doppler echocardiography  Sept 2011    EF 50-55% with some impaired diastolic relaxation  . Colonoscopy  2006     Multiple polyps removed, repeat in 5 years  . Right iliopopliteal bypass - [place]  . Femoral-popliteal bypass graft  2008    Left  . Cardiac catheterization  October 18, 2010    Known obstructive disease, no further blockages  . Cardiac catheterization  03/17/2005    EF 35-40%  . US echocardiography  03/04/2006    EF 50-55%  . Amputation  03/16/2011    Procedure: AMPUTATION DIGIT;  Surgeon: Angelia Mould, MD;  Location: [facility] OR;  Service: Vascular;  Laterality: Left;  Great toe   Family History  Problem Relation Age of Onset  . Heart disease Father   . Heart disease Mother    History  Substance Use Topics  . Smoking status: Former Smoker    Quit date: 06/19/2000  . Smokeless tobacco: Never Used  . Alcohol Use: No    Review of Systems  See History of Present Illness; otherwise all other systems are reviewed and negative   Allergies  Vancomycin and Diazepam  Home Medications   Prior to Admission medications   Medication Sig Start Date End Date Taking? Authorizing Provider  Acetaminophen (TYLENOL ARTHRITIS PAIN PO) Take 1-2 tablets by mouth 2 (two) times daily as needed (for arthritis pain).   Yes Historical Provider, MD  aspirin (BABY ASPIRIN) 81 MG chewable tablet Chew 81 mg by mouth daily.    Yes Historical Provider, MD  bisoprolol (ZEBETA) 5 MG tablet Take 0.5 tablets (2.5 mg total) by mouth daily. Patient taking differently: Take 5 mg by mouth daily.  01/20/14  Yes Alveda Reasons, MD  HYDROcodone-acetaminophen (NORCO) 10-325 MG per tablet Take 1 tablet by mouth every 8 (eight) hours as needed. Patient taking differently: Take 1 tablet by mouth every 8 (eight)  hours as needed for moderate pain or severe pain.  01/20/14  Yes Alveda Reasons, MD  insulin NPH Human (HUMULIN N,NOVOLIN N) 100 UNIT/ML injection Inject 0.35 mLs (35 Units total) into the skin 2 (two) times daily before a meal. 12/09/13  Yes Alveda Reasons, MD  lisinopril (PRINIVIL,ZESTRIL) 10 MG tablet Take 1 tablet (10 mg total) by mouth daily. 10/19/13  Yes Alveda Reasons, MD  LORazepam (ATIVAN) 0.5 MG tablet TAKE ONE TABLET BY MOUTH AT BEDTIME AS NEEDED FOR ANXIETY 01/20/14  Yes Alveda Reasons, MD  omeprazole (PRILOSEC) 40 MG capsule TAKE ONE CAPSULE BY MOUTH ONCE DAILY 11/29/13  Yes Alveda Reasons, MD  QUEtiapine (SEROQUEL) 50 MG tablet TAKE ONE TABLET BY MOUTH AT BEDTIME 01/31/14  Yes Alveda Reasons, MD  simvastatin (ZOCOR) 80 MG tablet TAKE ONE TABLET BY MOUTH AT BEDTIME 10/19/13  Yes Alveda Reasons, MD  tamsulosin (FLOMAX) 0.4 MG CAPS capsule Take 1 capsule (0.4 mg total) by mouth daily after supper. 10/19/13  Yes Alveda Reasons, MD  torsemide (DEMADEX) 20 MG tablet TAKE ONE TABLET BY MOUTH EVERY DAY 01/20/14  Yes Alveda Reasons, MD  venlafaxine XR (EFFEXOR-XR) 37.5 MG 24 hr capsule TAKE ONE CAPSULE BY MOUTH ONCE DAILY 10/19/13  Yes Alveda Reasons, MD  warfarin (COUMADIN) 5 MG tablet Take 1-1.5 tablets (5-7.5 mg total) by mouth daily. Take 1 tablet (65m) daily except Sunday then take 1 1/2 tablets (7.575m Patient taking differently: Take 5 mg by mouth daily. Take 1 tablet daily 10/19/13  Yes JeAlveda ReasonsMD  Blood Glucose Monitoring Suppl (ONE TOUCH ULTRA 2) W/DEVICE KIT Please supply patient with 1 glucometer device to use 4 times daily to check blood sugars.  Dx code: 250.0 10/19/13   JeAlveda ReasonsMD  glucose blood test strip Use as instructed.  One Step Ultra 2 strips. 02/06/14   JeAlveda ReasonsMD  Insulin Syringe-Needle U-100 (B-D INS SYRINGE 0.5CC/30GX1/2") 30G X 1/2" 0.5 ML MISC For use with insulin 01/20/14   JeAlveda ReasonsMD  NEEDLE, DISP, 30 G (B-D DISP  NEEDLE 30GX1") 30G X 1" MISC For using with insulin. 01/20/14   JeAlveda ReasonsMD  nitroGLYCERIN (NITROSTAT) 0.4 MG SL tablet Place 1 tablet (0.4 mg total) under the tongue as needed. For chest pains 10/19/13   JeAlveda ReasonsMD  traMADol (ULTRAM) 50 MG tablet TAKE ONE TO TWO TABLETS BY MOUTH EVERY 8 HOURS AS NEEDED Patient not taking: Reported on 02/08/2014    JeAlveda ReasonsMD  zoster vaccine live, PF, (ZOSTAVAX) [id number]/0.65ML injection Inject 19,400  Units into the skin once. 02/17/12   Alveda Reasons, MD   BP 104/52 mmHg  Pulse 72  Temp(Src) 98.2 F (36.8 C) (Oral)  Resp 13  Ht _0  (1.727 m)  Wt 276 lb (125.193 kg)  BMI 41.98 kg/m2  SpO2 96% Physical Exam  Constitutional: He is oriented to person, place, and time. He appears well-developed and well-nourished. He appears distressed (uncomfortable, complains of pain all over, irritable.).  HENT:  Head: Normocephalic.  Right Ear: External ear normal.  Left Ear: External ear normal.  Nose: Nose normal.  Mouth/Throat: Oropharynx is clear and moist.  1.5. centimeter Severe laceration to chin.  Bleeding controlled  Eyes: Conjunctivae and EOM are normal. Pupils are equal, round, and reactive to light.  Neck: Normal range of motion. Neck supple. No JVD present. No tracheal deviation present. No thyromegaly present.  Cardiovascular: Normal rate, regular rhythm, normal heart sounds and intact distal pulses.  Exam reveals no gallop and no friction rub.   No murmur heard. Pulmonary/Chest: Effort normal and breath sounds normal. No stridor. No respiratory distress. He has no wheezes. He has no rales. He exhibits no tenderness.  Abdominal: Soft. Bowel sounds are normal. He exhibits no distension and no mass. There is no tenderness. There is no rebound and no guarding.  Musculoskeletal: Normal range of motion. He exhibits tenderness (patient has tenderness with palpation, light or deep to all extremities and with movement of any  joint.). He exhibits no edema.  Lymphadenopathy:    He has no cervical adenopathy.  Neurological: He is alert and oriented to person, place, and time. He displays normal reflexes. No cranial nerve deficit. He exhibits normal muscle tone. Coordination normal.  Skin: Skin is warm and dry. No rash noted. No erythema. No pallor.  Psychiatric: He has a normal mood and affect. His behavior is normal. Judgment and thought content normal.  Nursing note and vitals reviewed.   ED Course  Procedures (including critical care time) Labs Review Labs Reviewed  COMPREHENSIVE METABOLIC PANEL - Abnormal; Notable for the following:    Sodium 136 (*)    Glucose, Bld 279 (*)    GFR calc non Af Amer 59 (*)    GFR calc Af Amer 69 (*)    Anion gap 16 (*)    All other components within normal limits  CBC WITH DIFFERENTIAL - Abnormal; Notable for the following:    RDW 16.2 (*)    All other components within normal limits  PROTIME-INR - Abnormal; Notable for the following:    Prothrombin Time 20.1 (*)    INR 1.69 (*)    All other components within normal limits  URINALYSIS, ROUTINE W REFLEX MICROSCOPIC  I-STAT TROPOININ, ED    Imaging Review Ct Head Wo Contrast  02/08/2014   CLINICAL DATA:  Dizziness with recent fall and facial abrasions  EXAM: CT HEAD WITHOUT CONTRAST  CT MAXILLOFACIAL WITHOUT CONTRAST  TECHNIQUE: Multidetector CT imaging of the head and maxillofacial structures were performed using the standard protocol without intravenous contrast. Multiplanar CT image reconstructions of the maxillofacial structures were also generated.  COMPARISON:  04/30/2013  FINDINGS: CT HEAD FINDINGS  Bony calvarium is intact. No gross soft tissue abnormality is noted. There again noted changes consistent with prior left MCA and PCA infarct stable from the prior exam. No findings to suggest acute hemorrhage, acute infarction or space-occupying mass lesion are noted. Chronic atrophic changes are again noted.  CT  MAXILLOFACIAL FINDINGS  The bony structures of maxillofacial  region show no acute fracture. The paranasal sinuses are well aerated. Mild septal deviation to the right is seen. Mastoid air cells are within normal limits. The orbits and their contents are within normal limits. No significant facial soft tissue changes are noted. The ostiomeatal complexes are patent bilaterally.  IMPRESSION: CT of the head: Chronic left PCA and MCA infarcts. No acute abnormality is noted.  CT of the maxillofacial bones: No acute fractures noted. No gross soft tissue abnormality is seen.   Electronically Signed   By: Inez Catalina M.D.   On: 02/08/2014 22:34   Dg Shoulder Left  02/08/2014   CLINICAL DATA:  [name] tonight, anterior LEFT shoulder pain, full range of motion  EXAM: LEFT SHOULDER - 2+ VIEW  COMPARISON:  None  FINDINGS: Osseous demineralization.  Minimal degenerative changes at LEFT [facility] joint with several small cranial ossicles.  No acute fracture, dislocation, or bone destruction.  Visualized LEFT ribs intact.  IMPRESSION: Osseous demineralization with degenerative changes LEFT AC joint.  No acute abnormalities.   Electronically Signed   By: Lavonia Dana M.D.   On: 02/08/2014 21:51   Ct Maxillofacial Wo Cm  02/08/2014   CLINICAL DATA:  Dizziness with recent fall and facial abrasions  EXAM: CT HEAD WITHOUT CONTRAST  CT MAXILLOFACIAL WITHOUT CONTRAST  TECHNIQUE: Multidetector CT imaging of the head and maxillofacial structures were performed using the standard protocol without intravenous contrast. Multiplanar CT image reconstructions of the maxillofacial structures were also generated.  COMPARISON:  04/30/2013  FINDINGS: CT HEAD FINDINGS  Bony calvarium is intact. No gross soft tissue abnormality is noted. There again noted changes consistent with prior left MCA and PCA infarct stable from the prior exam. No findings to suggest acute hemorrhage, acute infarction or space-occupying mass lesion are noted. Chronic atrophic  changes are again noted.  CT MAXILLOFACIAL FINDINGS  The bony structures of maxillofacial region show no acute fracture. The paranasal sinuses are well aerated. Mild septal deviation to the right is seen. Mastoid air cells are within normal limits. The orbits and their contents are within normal limits. No significant facial soft tissue changes are noted. The ostiomeatal complexes are patent bilaterally.  IMPRESSION: CT of the head: Chronic left PCA and MCA infarcts. No acute abnormality is noted.  CT of the maxillofacial bones: No acute fractures noted. No gross soft tissue abnormality is seen.   Electronically Signed   By: Inez Catalina M.D.   On: 02/08/2014 22:34     EKG Interpretation None     LACERATION REPAIR Performed by: Kalman Drape Authorized by: Kalman Drape Consent: Verbal consent obtained. Risks and benefits: risks, benefits and alternatives were discussed Consent given by: patient Patient identity confirmed: provided demographic data Prepped and Draped in normal sterile fashion Wound explored  Laceration Location: Chin  Laceration Length: 1.5cm  No Foreign Bodies seen or palpated  Anesthesia: none  Irrigation method: syringe Amount of cleaning: standard  Skin closure: Dermabond    Patient tolerance: Patient tolerated the procedure well with no immediate complications.  MDM   Final diagnoses:  Fall at home, initial encounter  Generalized weakness  Chin laceration, initial encounter  Generalized pain    72 year old male status post fall.  Patient reports that he was weak, which caused the fall.  It was not syncope.  Although he is on Coumadin is currently not therapeutic.  CT scans unremarkable.  X-rays unremarkable.  Patient is not orthostatic.  He wishes to go home.  Plan to repair laceration with  Dermabond and check UA.  Pt reports he has plan to restart coumadin per his clinic's orders.    Kalman Drape, MD 02/09/14 [phone]

## 2014-02-09 NOTE — Discharge Instructions (Signed)
Fall Prevention and Home Safety °Falls cause injuries and can affect all age groups. It is possible to use preventive measures to significantly decrease the likelihood of falls. There are many simple measures which can make your home safer and prevent falls. °OUTDOORS °· Repair cracks and edges of walkways and driveways. °· Remove high doorway thresholds. °· Trim shrubbery on the main path into your home. °· Have good outside lighting. °· Clear walkways of tools, rocks, debris, and clutter. °· Check that handrails are not broken and are securely fastened. Both sides of steps should have handrails. °· Have leaves, snow, and ice cleared regularly. °· Use sand or salt on walkways during winter months. °· In the garage, clean up grease or oil spills. °BATHROOM °· Install night lights. °· Install grab bars by the toilet and in the tub and shower. °· Use non-skid mats or decals in the tub or shower. °· Place a plastic non-slip stool in the shower to sit on, if needed. °· Keep floors dry and clean up all water on the floor immediately. °· Remove soap buildup in the tub or shower on a regular basis. °· Secure bath mats with non-slip, double-sided rug tape. °· Remove throw rugs and tripping hazards from the floors. °BEDROOMS °· Install night lights. °· Make sure a bedside light is easy to reach. °· Do not use oversized bedding. °· Keep a telephone by your bedside. °· Have a firm chair with side arms to use for getting dressed. °· Remove throw rugs and tripping hazards from the floor. °KITCHEN °· Keep handles on pots and pans turned toward the center of the stove. Use back burners when possible. °· Clean up spills quickly and allow time for drying. °· Avoid walking on wet floors. °· Avoid hot utensils and knives. °· Position shelves so they are not too high or low. °· Place commonly used objects within easy reach. °· If necessary, use a sturdy step stool with a grab bar when reaching. °· Keep electrical cables out of the  way. °· Do not use floor polish or wax that makes floors slippery. If you must use wax, use non-skid floor wax. °· Remove throw rugs and tripping hazards from the floor. °STAIRWAYS °· Never leave objects on stairs. °· Place handrails on both sides of stairways and use them. Fix any loose handrails. Make sure handrails on both sides of the stairways are as long as the stairs. °· Check carpeting to make sure it is firmly attached along stairs. Make repairs to worn or loose carpet promptly. °· Avoid placing throw rugs at the top or bottom of stairways, or properly secure the rug with carpet tape to prevent slippage. Get rid of throw rugs, if possible. °· Have an electrician put in a light switch at the top and bottom of the stairs. °OTHER FALL PREVENTION TIPS °· Wear low-heel or rubber-soled shoes that are supportive and fit well. Wear closed toe shoes. °· When using a stepladder, make sure it is fully opened and both spreaders are firmly locked. Do not climb a closed stepladder. °· Add color or contrast paint or tape to grab bars and handrails in your home. Place contrasting color strips on first and last steps. °· Learn and use mobility aids as needed. Install an electrical emergency response system. °· Turn on lights to avoid dark areas. Replace light bulbs that burn out immediately. Get light switches that glow. °· Arrange furniture to create clear pathways. Keep furniture in the same place. °·   Firmly attach carpet with non-skid or double-sided tape. °· Eliminate uneven floor surfaces. °· Select a carpet pattern that does not visually hide the edge of steps. °· Be aware of all pets. °OTHER HOME SAFETY TIPS °· Set the water temperature for 120° F (48.8° C). °· Keep emergency numbers on or near the telephone. °· Keep smoke detectors on every level of the home and near sleeping areas. °Document Released: 02/28/2002 Document Revised: 09/09/2011 Document Reviewed: 05/30/2011 °ExitCare® Patient Information ©2015  [employer]. This information is not intended to replace advice given to you by your health care provider. Make sure you discuss any questions you have with your health care provider. ° ° °Weakness °Weakness is a lack of strength. It may be felt all over the body (generalized) or in one specific part of the body (focal). Some causes of weakness can be serious. You may need further medical evaluation, especially if you are elderly or you have a history of immunosuppression (such as chemotherapy or HIV), kidney disease, heart disease, or diabetes. °CAUSES  °Weakness can be caused by many different things, including: °· Infection. °· Physical exhaustion. °· Internal bleeding or other blood loss that results in a lack of red blood cells (anemia). °· Dehydration. This cause is more common in elderly people. °· Side effects or electrolyte abnormalities from medicines, such as pain medicines or sedatives. °· Emotional distress, anxiety, or depression. °· Circulation problems, especially severe peripheral arterial disease. °· Heart disease, such as rapid atrial fibrillation, bradycardia, or heart failure. °· Nervous system disorders, such as Guillain-Barré syndrome, multiple sclerosis, or stroke. °DIAGNOSIS  °To find the cause of your weakness, your caregiver will take your history and perform a physical exam. Lab tests or X-rays may also be ordered, if needed. °TREATMENT  °Treatment of weakness depends on the cause of your symptoms and can vary greatly. °HOME CARE INSTRUCTIONS  °· Rest as needed. °· Eat a well-balanced diet. °· Try to get some exercise every day. °· Only take over-the-counter or prescription medicines as directed by your caregiver. °SEEK MEDICAL CARE IF:  °· Your weakness seems to be getting worse or spreads to other parts of your body. °· You develop new aches or pains. °SEEK IMMEDIATE MEDICAL CARE IF:  °· You cannot perform your normal daily activities, such as getting dressed and feeding  yourself. °· You cannot walk up and down stairs, or you feel exhausted when you do so. °· You have shortness of breath or chest pain. °· You have difficulty moving parts of your body. °· You have weakness in only one area of the body or on only one side of the body. °· You have a fever. °· You have trouble speaking or swallowing. °· You cannot control your bladder or bowel movements. °· You have black or bloody vomit or stools. °MAKE SURE YOU: °· Understand these instructions. °· Will watch your condition. °· Will get help right away if you are not doing well or get worse. °Document Released: 03/10/2005 Document Revised: 09/09/2011 Document Reviewed: 05/09/2011 °ExitCare® Patient Information ©2015 [employer]. This information is not intended to replace advice given to you by your health care provider. Make sure you discuss any questions you have with your health care provider. ° °

## 2014-02-14 ENCOUNTER — Encounter: Payer: Self-pay | Admitting: Family Medicine

## 2014-02-14 NOTE — Progress Notes (Unsigned)
Patient requested refill for Hydroco/acetamin 10-325 mg. Please, follow up with Patient.

## 2014-02-20 ENCOUNTER — Other Ambulatory Visit: Payer: Self-pay | Admitting: Family Medicine

## 2014-02-20 ENCOUNTER — Ambulatory Visit (INDEPENDENT_AMBULATORY_CARE_PROVIDER_SITE_OTHER): Payer: Medicare Other | Admitting: *Deleted

## 2014-02-20 DIAGNOSIS — Z7901 Long term (current) use of anticoagulants: Secondary | ICD-10-CM

## 2014-02-20 LAB — POCT INR: INR: 3

## 2014-02-20 MED ORDER — HYDROCODONE-ACETAMINOPHEN 10-325 MG PO TABS: 1.0000 | ORAL_TABLET | Freq: Three times a day (TID) | ORAL | Status: DC | PRN

## 2014-02-20 MED ORDER — GLUCOSE BLOOD VI STRP: ORAL_STRIP | Status: DC

## 2014-02-20 NOTE — Progress Notes (Signed)
COmpleted and given to him today when he came in for INR check.

## 2014-02-22 ENCOUNTER — Telehealth: Payer: Self-pay | Admitting: Family Medicine

## 2014-02-22 NOTE — Telephone Encounter (Signed)
Patient's wife request refill for insulin. Please follow up.

## 2014-02-23 MED ORDER — INSULIN NPH (HUMAN) (ISOPHANE) 100 UNIT/ML ~~LOC~~ SUSP: 35.0000 [IU] | Freq: Two times a day (BID) | SUBCUTANEOUS | Status: DC

## 2014-02-23 NOTE — Telephone Encounter (Signed)
I've sent in refill

## 2014-03-03 ENCOUNTER — Ambulatory Visit (INDEPENDENT_AMBULATORY_CARE_PROVIDER_SITE_OTHER): Payer: Medicare Other | Admitting: *Deleted

## 2014-03-03 DIAGNOSIS — Z7901 Long term (current) use of anticoagulants: Secondary | ICD-10-CM

## 2014-03-03 LAB — POCT INR: INR: 2.8

## 2014-03-07 ENCOUNTER — Ambulatory Visit: Payer: Medicare Other

## 2014-03-08 ENCOUNTER — Telehealth: Payer: Self-pay | Admitting: *Deleted

## 2014-03-08 ENCOUNTER — Other Ambulatory Visit: Payer: Self-pay | Admitting: Family Medicine

## 2014-03-08 NOTE — Telephone Encounter (Signed)
Pt needs a refill on ativan,  Also wife states that she cant pick up her "warfarin until we call the pharmacy and ok it because they changed suppliers" ([facility])  Advised I would send massage to MD. Clinton Sawyer, Salome Spotted

## 2014-03-08 NOTE — Telephone Encounter (Signed)
Placed in to be faxed box. 

## 2014-03-09 ENCOUNTER — Other Ambulatory Visit: Payer: Self-pay | Admitting: Family Medicine

## 2014-03-28 ENCOUNTER — Ambulatory Visit (INDEPENDENT_AMBULATORY_CARE_PROVIDER_SITE_OTHER): Payer: Medicare Other | Admitting: *Deleted

## 2014-03-28 ENCOUNTER — Other Ambulatory Visit: Payer: Self-pay | Admitting: *Deleted

## 2014-03-28 DIAGNOSIS — Z7901 Long term (current) use of anticoagulants: Secondary | ICD-10-CM

## 2014-03-28 LAB — POCT INR: INR: 4

## 2014-03-29 MED ORDER — BISOPROLOL FUMARATE 5 MG PO TABS: 2.5000 mg | ORAL_TABLET | Freq: Every day | ORAL | Status: DC

## 2014-04-12 ENCOUNTER — Emergency Department (HOSPITAL_COMMUNITY): Payer: Medicare Other

## 2014-04-12 ENCOUNTER — Encounter: Payer: Self-pay | Admitting: Family Medicine

## 2014-04-12 ENCOUNTER — Observation Stay (HOSPITAL_COMMUNITY)
Admission: EM | Admit: 2014-04-12 | Discharge: 2014-04-13 | Disposition: A | Discharge status: Home / Self Care | Payer: Medicare Other | Attending: Family Medicine | Admitting: Family Medicine

## 2014-04-12 ENCOUNTER — Other Ambulatory Visit: Payer: Self-pay

## 2014-04-12 ENCOUNTER — Encounter (HOSPITAL_COMMUNITY): Payer: Self-pay | Admitting: Physical Medicine and Rehabilitation

## 2014-04-12 ENCOUNTER — Ambulatory Visit (INDEPENDENT_AMBULATORY_CARE_PROVIDER_SITE_OTHER): Payer: Medicare Other | Admitting: *Deleted

## 2014-04-12 ENCOUNTER — Ambulatory Visit (INDEPENDENT_AMBULATORY_CARE_PROVIDER_SITE_OTHER): Payer: Medicare Other | Admitting: Family Medicine

## 2014-04-12 VITALS — BP 120/75 | HR 59 | Temp 97.6°F | Ht 68.0 in | Wt 268.4 lb

## 2014-04-12 DIAGNOSIS — R4182 Altered mental status, unspecified: Secondary | ICD-10-CM | POA: Diagnosis not present

## 2014-04-12 DIAGNOSIS — R1010 Upper abdominal pain, unspecified: Secondary | ICD-10-CM | POA: Insufficient documentation

## 2014-04-12 DIAGNOSIS — Z87891 Personal history of nicotine dependence: Secondary | ICD-10-CM | POA: Diagnosis not present

## 2014-04-12 DIAGNOSIS — Z9981 Dependence on supplemental oxygen: Secondary | ICD-10-CM | POA: Diagnosis not present

## 2014-04-12 DIAGNOSIS — R079 Chest pain, unspecified: Secondary | ICD-10-CM | POA: Insufficient documentation

## 2014-04-12 DIAGNOSIS — Z79899 Other long term (current) drug therapy: Secondary | ICD-10-CM | POA: Diagnosis not present

## 2014-04-12 DIAGNOSIS — I951 Orthostatic hypotension: Secondary | ICD-10-CM | POA: Diagnosis not present

## 2014-04-12 DIAGNOSIS — K5790 Diverticulosis of intestine, part unspecified, without perforation or abscess without bleeding: Secondary | ICD-10-CM | POA: Diagnosis not present

## 2014-04-12 DIAGNOSIS — J984 Other disorders of lung: Secondary | ICD-10-CM | POA: Diagnosis not present

## 2014-04-12 DIAGNOSIS — Z86711 Personal history of pulmonary embolism: Secondary | ICD-10-CM | POA: Diagnosis not present

## 2014-04-12 DIAGNOSIS — K219 Gastro-esophageal reflux disease without esophagitis: Secondary | ICD-10-CM | POA: Diagnosis not present

## 2014-04-12 DIAGNOSIS — I517 Cardiomegaly: Secondary | ICD-10-CM | POA: Diagnosis not present

## 2014-04-12 DIAGNOSIS — Z7982 Long term (current) use of aspirin: Secondary | ICD-10-CM | POA: Insufficient documentation

## 2014-04-12 DIAGNOSIS — J449 Chronic obstructive pulmonary disease, unspecified: Secondary | ICD-10-CM | POA: Diagnosis not present

## 2014-04-12 DIAGNOSIS — R531 Weakness: Secondary | ICD-10-CM | POA: Diagnosis not present

## 2014-04-12 DIAGNOSIS — N400 Enlarged prostate without lower urinary tract symptoms: Secondary | ICD-10-CM | POA: Diagnosis not present

## 2014-04-12 DIAGNOSIS — G8929 Other chronic pain: Secondary | ICD-10-CM | POA: Insufficient documentation

## 2014-04-12 DIAGNOSIS — I491 Atrial premature depolarization: Secondary | ICD-10-CM | POA: Diagnosis not present

## 2014-04-12 DIAGNOSIS — I252 Old myocardial infarction: Secondary | ICD-10-CM | POA: Diagnosis not present

## 2014-04-12 DIAGNOSIS — R2681 Unsteadiness on feet: Secondary | ICD-10-CM

## 2014-04-12 DIAGNOSIS — I739 Peripheral vascular disease, unspecified: Secondary | ICD-10-CM | POA: Diagnosis not present

## 2014-04-12 DIAGNOSIS — Z794 Long term (current) use of insulin: Secondary | ICD-10-CM | POA: Diagnosis not present

## 2014-04-12 DIAGNOSIS — E785 Hyperlipidemia, unspecified: Secondary | ICD-10-CM | POA: Insufficient documentation

## 2014-04-12 DIAGNOSIS — M7500 Adhesive capsulitis of unspecified shoulder: Secondary | ICD-10-CM | POA: Insufficient documentation

## 2014-04-12 DIAGNOSIS — R0602 Shortness of breath: Secondary | ICD-10-CM | POA: Diagnosis not present

## 2014-04-12 DIAGNOSIS — I129 Hypertensive chronic kidney disease with stage 1 through stage 4 chronic kidney disease, or unspecified chronic kidney disease: Secondary | ICD-10-CM | POA: Diagnosis not present

## 2014-04-12 DIAGNOSIS — R410 Disorientation, unspecified: Secondary | ICD-10-CM | POA: Diagnosis present

## 2014-04-12 DIAGNOSIS — Z7901 Long term (current) use of anticoagulants: Secondary | ICD-10-CM | POA: Insufficient documentation

## 2014-04-12 DIAGNOSIS — N189 Chronic kidney disease, unspecified: Secondary | ICD-10-CM | POA: Insufficient documentation

## 2014-04-12 DIAGNOSIS — I509 Heart failure, unspecified: Secondary | ICD-10-CM | POA: Insufficient documentation

## 2014-04-12 DIAGNOSIS — G4733 Obstructive sleep apnea (adult) (pediatric): Secondary | ICD-10-CM | POA: Insufficient documentation

## 2014-04-12 DIAGNOSIS — I2510 Atherosclerotic heart disease of native coronary artery without angina pectoris: Secondary | ICD-10-CM | POA: Diagnosis not present

## 2014-04-12 DIAGNOSIS — F329 Major depressive disorder, single episode, unspecified: Secondary | ICD-10-CM | POA: Insufficient documentation

## 2014-04-12 DIAGNOSIS — Z8673 Personal history of transient ischemic attack (TIA), and cerebral infarction without residual deficits: Secondary | ICD-10-CM | POA: Diagnosis not present

## 2014-04-12 DIAGNOSIS — D3001 Benign neoplasm of right kidney: Secondary | ICD-10-CM | POA: Diagnosis not present

## 2014-04-12 DIAGNOSIS — F419 Anxiety disorder, unspecified: Secondary | ICD-10-CM | POA: Diagnosis not present

## 2014-04-12 DIAGNOSIS — E1140 Type 2 diabetes mellitus with diabetic neuropathy, unspecified: Secondary | ICD-10-CM | POA: Diagnosis not present

## 2014-04-12 DIAGNOSIS — M549 Dorsalgia, unspecified: Secondary | ICD-10-CM | POA: Diagnosis not present

## 2014-04-12 HISTORY — DX: Dependence on supplemental oxygen: Z99.81

## 2014-04-12 LAB — URINALYSIS, ROUTINE W REFLEX MICROSCOPIC
BILIRUBIN URINE: NEGATIVE
Glucose, UA: NEGATIVE mg/dL
HGB URINE DIPSTICK: NEGATIVE
KETONES UR: NEGATIVE mg/dL
Leukocytes, UA: NEGATIVE
Nitrite: NEGATIVE
Protein, ur: NEGATIVE mg/dL
Specific Gravity, Urine: 1.021 (ref 1.005–1.030)
UROBILINOGEN UA: 1 mg/dL (ref 0.0–1.0)
pH: 6.5 (ref 5.0–8.0)

## 2014-04-12 LAB — CBC WITH DIFFERENTIAL/PLATELET
Basophils Absolute: 0 10*3/uL (ref 0.0–0.1)
Basophils Relative: 0 % (ref 0–1)
EOS ABS: 0.1 10*3/uL (ref 0.0–0.7)
EOS PCT: 1 % (ref 0–5)
HEMATOCRIT: 39 % (ref 39.0–52.0)
Hemoglobin: 12.9 g/dL — ABNORMAL LOW (ref 13.0–17.0)
LYMPHS ABS: 1.8 10*3/uL (ref 0.7–4.0)
LYMPHS PCT: 18 % (ref 12–46)
MCH: 26.8 pg (ref 26.0–34.0)
MCHC: 33.1 g/dL (ref 30.0–36.0)
MCV: 80.9 fL (ref 78.0–100.0)
MONOS PCT: 8 % (ref 3–12)
Monocytes Absolute: 0.7 10*3/uL (ref 0.1–1.0)
Neutro Abs: 7 10*3/uL (ref 1.7–7.7)
Neutrophils Relative %: 73 % (ref 43–77)
PLATELETS: 171 10*3/uL (ref 150–400)
RBC: 4.82 MIL/uL (ref 4.22–5.81)
RDW: 17.1 % — ABNORMAL HIGH (ref 11.5–15.5)
WBC: 9.6 10*3/uL (ref 4.0–10.5)

## 2014-04-12 LAB — POCT INR: INR: 3.6

## 2014-04-12 LAB — BRAIN NATRIURETIC PEPTIDE: B Natriuretic Peptide: 208.3 pg/mL — ABNORMAL HIGH (ref 0.0–100.0)

## 2014-04-12 LAB — TROPONIN I: Troponin I: <0.03 ng/mL (ref <0.031)

## 2014-04-12 LAB — COMPREHENSIVE METABOLIC PANEL
ALK PHOS: 53 U/L (ref 39–117)
ALT: 16 U/L (ref 0–53)
ANION GAP: 10 (ref 5–15)
AST: 21 U/L (ref 0–37)
Albumin: 3.8 g/dL (ref 3.5–5.2)
BUN: 21 mg/dL (ref 6–23)
CHLORIDE: 108 meq/L (ref 96–112)
CO2: 22 mmol/L (ref 19–32)
Calcium: 9 mg/dL (ref 8.4–10.5)
Creatinine, Ser: 1.42 mg/dL — ABNORMAL HIGH (ref 0.50–1.35)
GFR, EST AFRICAN AMERICAN: 54 mL/min — AB (ref >90)
GFR, EST NON AFRICAN AMERICAN: 47 mL/min — AB (ref >90)
GLUCOSE: 116 mg/dL — AB (ref 70–99)
POTASSIUM: 4.6 mmol/L (ref 3.5–5.1)
SODIUM: 140 mmol/L (ref 135–145)
TOTAL PROTEIN: 6.3 g/dL (ref 6.0–8.3)
Total Bilirubin: 0.6 mg/dL (ref 0.3–1.2)

## 2014-04-12 LAB — LACTIC ACID, PLASMA: LACTIC ACID, VENOUS: 1.1 mmol/L (ref 0.5–2.2)

## 2014-04-12 LAB — LIPASE, BLOOD: LIPASE: 27 U/L (ref 11–59)

## 2014-04-12 MED ORDER — VENLAFAXINE HCL ER 37.5 MG PO CP24
37.5000 mg | ORAL_CAPSULE | Freq: Every day | ORAL | Status: DC
  Administered 2014-04-13: 37.5 mg via ORAL
  Filled 2014-04-12 (×2): qty 1

## 2014-04-12 MED ORDER — PANTOPRAZOLE SODIUM 40 MG PO TBEC
40.0000 mg | DELAYED_RELEASE_TABLET | Freq: Every day | ORAL | Status: DC
  Administered 2014-04-13: 40 mg via ORAL
  Filled 2014-04-12: qty 1

## 2014-04-12 MED ORDER — SODIUM CHLORIDE 0.9 % IJ SOLN: 3.0000 mL | INTRAMUSCULAR | Status: DC | PRN

## 2014-04-12 MED ORDER — LORAZEPAM 1 MG PO TABS
0.5000 mg | ORAL_TABLET | Freq: Once | ORAL | Status: AC
  Administered 2014-04-12: 0.5 mg via ORAL
  Filled 2014-04-12: qty 1

## 2014-04-12 MED ORDER — SODIUM CHLORIDE 0.9 % IV BOLUS (SEPSIS)
500.0000 mL | Freq: Once | INTRAVENOUS | Status: AC
  Administered 2014-04-12: 500 mL via INTRAVENOUS

## 2014-04-12 MED ORDER — WARFARIN - PHARMACIST DOSING INPATIENT: Freq: Every day | Status: DC

## 2014-04-12 MED ORDER — ASPIRIN 81 MG PO CHEW
81.0000 mg | CHEWABLE_TABLET | Freq: Every day | ORAL | Status: DC
  Administered 2014-04-13: 81 mg via ORAL
  Filled 2014-04-12: qty 1

## 2014-04-12 MED ORDER — SODIUM CHLORIDE 0.9 % IV SOLN: 250.0000 mL | INTRAVENOUS | Status: DC | PRN

## 2014-04-12 MED ORDER — INSULIN ASPART 100 UNIT/ML ~~LOC~~ SOLN
0.0000 [IU] | Freq: Three times a day (TID) | SUBCUTANEOUS | Status: DC
  Administered 2014-04-13: 1 [IU] via SUBCUTANEOUS

## 2014-04-12 MED ORDER — INSULIN NPH (HUMAN) (ISOPHANE) 100 UNIT/ML ~~LOC~~ SUSP
35.0000 [IU] | Freq: Two times a day (BID) | SUBCUTANEOUS | Status: DC
  Administered 2014-04-13: 35 [IU] via SUBCUTANEOUS
  Filled 2014-04-12: qty 10

## 2014-04-12 MED ORDER — SODIUM CHLORIDE 0.9 % IJ SOLN
3.0000 mL | Freq: Two times a day (BID) | INTRAMUSCULAR | Status: DC
  Administered 2014-04-12 – 2014-04-13 (×2): 3 mL via INTRAVENOUS

## 2014-04-12 MED ORDER — IOHEXOL 300 MG/ML  SOLN
25.0000 mL | INTRAMUSCULAR | Status: AC
  Administered 2014-04-12: 25 mL via ORAL

## 2014-04-12 MED ORDER — QUETIAPINE FUMARATE 50 MG PO TABS
50.0000 mg | ORAL_TABLET | Freq: Every day | ORAL | Status: DC
  Administered 2014-04-12: 50 mg via ORAL
  Filled 2014-04-12 (×2): qty 1

## 2014-04-12 MED ORDER — BISOPROLOL FUMARATE 5 MG PO TABS
2.5000 mg | ORAL_TABLET | Freq: Every day | ORAL | Status: DC
  Administered 2014-04-13: 2.5 mg via ORAL
  Filled 2014-04-12: qty 0.5

## 2014-04-12 MED ORDER — TORSEMIDE 20 MG PO TABS
20.0000 mg | ORAL_TABLET | Freq: Every day | ORAL | Status: DC
  Administered 2014-04-13: 20 mg via ORAL
  Filled 2014-04-12: qty 1

## 2014-04-12 MED ORDER — TAMSULOSIN HCL 0.4 MG PO CAPS
0.4000 mg | ORAL_CAPSULE | Freq: Every day | ORAL | Status: DC
  Filled 2014-04-12: qty 1

## 2014-04-12 MED ORDER — ATORVASTATIN CALCIUM 40 MG PO TABS
40.0000 mg | ORAL_TABLET | Freq: Every day | ORAL | Status: DC
  Filled 2014-04-12: qty 1

## 2014-04-12 MED ORDER — IOHEXOL 300 MG/ML  SOLN
100.0000 mL | Freq: Once | INTRAMUSCULAR | Status: AC | PRN
  Administered 2014-04-12: 100 mL via INTRAVENOUS

## 2014-04-12 MED ORDER — LORAZEPAM 0.5 MG PO TABS
0.5000 mg | ORAL_TABLET | Freq: Two times a day (BID) | ORAL | Status: DC | PRN
Start: 2014-04-12 — End: 2014-04-13

## 2014-04-12 MED ORDER — NITROGLYCERIN 0.4 MG SL SUBL: 0.4000 mg | SUBLINGUAL_TABLET | SUBLINGUAL | Status: DC | PRN

## 2014-04-12 NOTE — Patient Instructions (Signed)
Instructed to go to ED.

## 2014-04-12 NOTE — ED Notes (Addendum)
Report attempted, RN on isolation room, RN to call back for report.

## 2014-04-12 NOTE — H&P (Signed)
[facility] Admission History and Physical Service Pager: [phone]  Patient name: [name] Medical record number: [id number] Date of birth: [birth year] Age: 74 y.o. Gender: male  Primary Care Provider: [name], MD Consultants: none Code Status: full (confirmed on admission)  Chief Complaint: Pacing around house, more agitated  Assessment and Plan: [name] is a 74 y.o. male presenting with agitation, concern for AMS. PMH is significant for HLD, HTN, HFrEF, BPH, OSA, Depression/anxiety, T2DM, PVD, COPD, diabetic neuropathy, orthostatic hypotension, h/o PE on chronic anticoagulation, h/o CVA.  Altered mental status, agitation, unsteady gait: Appears to be much improved as compared to being seen in clinic. Doubt infection as etiology (WBC wnl, afebrile, VSS, CXR and CT abd/pelvis without evidence of infection). Doubt CVA - CT head neg for acute infarct, neuro exam nonfocal. Likely medication induced (on norco, effexor, ativan and seroquel) vs worsening of depression/anxiety. Patient could have some delirium on underlying cognitive deficits. LFTs wnl, troponin neg, EKG nonischemic.  At baseline his gait  - Admit for observation, med-surg, under FPTS, attending Lindell Noe - monitor for signs of infection - Continuing home ativan, effexor, and seroquel - could consider holding as these are possible culprits - Holding home Norco - consider psych consult - this could be related to worsening of depression/anxiety - likely to benefit from OP f/u - f/u UDS and ammonia - consider MMSE in the AM  AKI: Baseline Cr 0.9-1.2. Cr 1.42 on admission.  Could be related to decreased PO intake recently. 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 - OP Urology follow-up for monitoring  HFrEF, HLD, HTN: Last Echo 2/15 with EF 40-45%, severe hypokinesis consistent with infarction of  RCA, normal diastolic function, calcified MV annulus. +LE edema, but lungs clear. No pulm edema on CXR. Currently normotensive. - Continue home ASA, lipitor 40, bisoprolol 2.38m daily, torsemide 25mdaily -Monitor fluid status carefully  Depression/anxiety: See AMS problem above - Continue home ativan, Effexor, and seroquel  T2DM: Uncontrolled. Glucose 116 on BMET from ED. Last A1c 8.7 (12/09/13). - Continue home NPH 35 units BID - montior CBGs - sens SSI  Chronic pain/: s/p hip replacement, h/o b/l frozen shoulders -Holding home Norco and tramadol, given AMS - Can consider restarting Tramadol if complains of pain.  H/o prior PE, on anticoagulation: has had falls in the past but treatment outweighs the risk of a bleed with a fall  - coumadin per pharm.   FEN/GI: Heart healthy/carb modified diet, SLIV Prophylaxis: Coumadin per pharm  Disposition: Place in observation on med/surg under FPTS, attending [facility]. Dispo pending improvement in mental status  History of Present Illness: [name] a [age].o. male presenting with agitation, anxiety and pacing aimlessly for ~1wk.   [facility] Wife reports they were concerned about his agitation and pacing, so he was put in an appointment slot with Dr. RuGerlean Ren He was sent to ED after office visit for CT to rule out acute CVA.  Wife reports patient has not been acting like himself for about 1 week.  He has been more irritable and agitated than usual.  He is also pacing around the house and never sitting still (he usually sits on the couch and watches TV).  Wife is worried that he may be a little confused - but is oriented.  He reports chronic  unsteadiness of gait with h/o multiple falls.  His last fall was 3 weeks ago.  Denies fevers/chills, CP, SOB, dysuria, cough, N/V/D, constipation.    In the ED: 500cc NS bolus, Ativan 0.53m PO x1  Review Of Systems: Per HPI with the following additions:  intermittent mild abdominal pain, chronic shoulder and hip pain Otherwise 12 point review of systems was performed and was unremarkable.  Patient Active Problem List   Diagnosis Date Noted  . Delirium 04/12/2014  . Unsteady gait 04/12/2014  . Orthostatic hypotension 05/16/2013  . Generalized weakness 04/30/2013  . Left hip pain 12/10/2012  . At high risk for falls 07/19/2012  . Diarrhea 04/06/2012  . Vitamin D deficiency 03/12/2012  . Falls 02/17/2012  . Bradycardia 01/22/2012  . Hemorrhoid 01/12/2012  . GERD (gastroesophageal reflux disease) 11/10/2011  . Knee pain, bilateral 10/14/2011  . Leg edema 09/20/2011  . Fatigue 07/04/2011  . Peripheral vascular disease, unspecified 04/16/2011  . DM (diabetes mellitus), type 2, uncontrolled 04/16/2011  . Vancomycin adverse reaction 03/13/2011  . COPD (chronic obstructive pulmonary disease) 02/27/2011  . Stasis dermatitis 12/04/2010  . BPH (benign prostatic hyperplasia) 11/01/2010  . Long term (current) use of anticoagulants 05/03/2010  . FROZEN RIGHT SHOULDER 02/25/2010  . DIABETIC PERIPHERAL NEUROPATHY 12/20/2009  . DEPRESSION 07/08/2006  . HYPERTENSION 07/08/2006  . HYPERCHOLESTEROLEMIA 05/21/2006  . Anxiety state 05/21/2006  . CORONARY, ARTERIOSCLEROSIS 05/21/2006  . CHF - EJECTION FRACTION < 50% 05/21/2006  . REFLUX ESOPHAGITIS 05/21/2006  . APNEA, SLEEP 05/21/2006   Past Medical History: Past Medical History  Diagnosis Date  . Depression   . Diabetes mellitus   . COPD (chronic obstructive pulmonary disease)   . Adhesive capsulitis   . MRSA bacteremia     2011 - possible endocarditis, received 6 weeks IV treatment  . Hyperlipidemia   . Anxiety   . GERD (gastroesophageal reflux disease)   . CAD (coronary artery disease)   . 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 CVA (cerebral infarction) Questionable history  . Diverticulosis   . Osteomyelitis 2012    Sternoclavicular joint   . Chronic  back pain   . Angina   . Myocardial infarction   . CHF (congestive heart failure)   . Shortness of breath   . Sleep apnea   . Chronic kidney disease     hx of BPH  . Neuromuscular disorder     HX of diabetic periferal neuropathy  . Hypertension   . Peripheral vascular disease   . On home oxygen therapy     "1.5L prn" (04/12/2014)   Past Surgical History: Past Surgical History  Procedure Laterality Date  . Septic arthritis      Removal of infected CABG wire by Dr BArlyce Dice- 2011  . Coronary artery bypass graft  1992  . Fracture surgery  1980s    Hip  . Spine surgery  2002    Cervical fusion vertebroplasty C3-4-5  . Coronary stent placement  1999, 2002  . Doppler echocardiography  Sept 2011    EF 50-55% with some impaired diastolic relaxation  . Colonoscopy  2006     Multiple polyps removed, repeat in 5 years  . Right iliopopliteal bypass - 1[place] . Femoral-popliteal bypass graft  2008    Left  . Cardiac catheterization  October 18, 2010    Known obstructive disease, no further blockages  . Cardiac catheterization  03/17/2005    EF 35-40%  .  US echocardiography  03/04/2006    EF 50-55%  . Amputation  03/16/2011    Procedure: AMPUTATION DIGIT;  Surgeon: Angelia Mould, MD;  Location: [facility] OR;  Service: Vascular;  Laterality: Left;  Great toe   Social History: History  Substance Use Topics  . Smoking status: Former Smoker    Quit date: 06/19/2000  . Smokeless tobacco: Never Used  . Alcohol Use: No   Additional social history: quit smoking ~20 yrs ago, denies alcohol/drugs  Please also refer to relevant sections of EMR.  Family History: Family History  Problem Relation Age of Onset  . Heart disease Father   . Heart disease Mother    Allergies and Medications: Allergies  Allergen Reactions  . Vancomycin Other (See Comments)    "red man syndrome"  . Diazepam Anxiety    REACTION: makes patient cry   No current facility-administered medications on file  prior to encounter.   Current Outpatient Prescriptions on File Prior to Encounter  Medication Sig Dispense Refill  . Acetaminophen (TYLENOL ARTHRITIS PAIN PO) Take 1-2 tablets by mouth 2 (two) times daily as needed (for arthritis pain).    Marland Kitchen aspirin (BABY ASPIRIN) 81 MG chewable tablet Chew 81 mg by mouth daily.     . bisoprolol (ZEBETA) 5 MG tablet Take 0.5 tablets (2.5 mg total) by mouth daily. 90 tablet 3  . insulin NPH Human (HUMULIN N,NOVOLIN N) 100 UNIT/ML injection Inject 0.35 mLs (35 Units total) into the skin 2 (two) times daily before a meal. 10 mL 11  . lisinopril (PRINIVIL,ZESTRIL) 10 MG tablet Take 1 tablet (10 mg total) by mouth daily. 90 tablet 2  . LORazepam (ATIVAN) 0.5 MG tablet TAKE ONE TABLET BY MOUTH AT BEDTIME AS NEEDED FOR ANXIETY 20 tablet 0  . nitroGLYCERIN (NITROSTAT) 0.4 MG SL tablet Place 1 tablet (0.4 mg total) under the tongue as needed. For chest pains 30 tablet 6  . omeprazole (PRILOSEC) 40 MG capsule TAKE ONE CAPSULE BY MOUTH ONCE DAILY 30 capsule 3  . QUEtiapine (SEROQUEL) 50 MG tablet TAKE ONE TABLET BY MOUTH AT BEDTIME 30 tablet 3  . simvastatin (ZOCOR) 80 MG tablet TAKE ONE TABLET BY MOUTH AT BEDTIME 90 tablet 3  . tamsulosin (FLOMAX) 0.4 MG CAPS capsule Take 1 capsule (0.4 mg total) by mouth daily after supper. 90 capsule 1  . torsemide (DEMADEX) 20 MG tablet TAKE ONE TABLET BY MOUTH EVERY DAY 60 tablet 2  . venlafaxine XR (EFFEXOR-XR) 37.5 MG 24 hr capsule TAKE ONE CAPSULE BY MOUTH ONCE DAILY 30 capsule 11  . Blood Glucose Monitoring Suppl (ONE TOUCH ULTRA 2) W/DEVICE KIT Please supply patient with 1 glucometer device to use 4 times daily to check blood sugars.  Dx code: 250.0 1 each 1  . glucose blood test strip Use as instructed.  One Step Ultra 2 strips. 100 each 12  . HYDROcodone-acetaminophen (NORCO) 10-325 MG per tablet Take 1 tablet by mouth every 8 (eight) hours as needed. (Patient not taking: Reported on 04/12/2014) 45 tablet 0  . Insulin  Syringe-Needle U-100 (B-D INS SYRINGE 0.5CC/30GX1/2") 30G X 1/2" 0.5 ML MISC For use with insulin 100 each 0  . NEEDLE, DISP, 30 G (B-D DISP NEEDLE 30GX1") 30G X 1" MISC For using with insulin. 100 each 0  . traMADol (ULTRAM) 50 MG tablet TAKE ONE TO TWO TABLETS BY MOUTH EVERY 8 HOURS AS NEEDED (Patient not taking: Reported on 02/08/2014) 30 tablet 1  . warfarin (COUMADIN) 5 MG tablet Take  1-1.5 tablets (5-7.5 mg total) by mouth daily. Take 1 tablet (68m) daily except Sunday then take 1 1/2 tablets (7.511m (Patient not taking: Reported on 04/12/2014) 60 tablet 3  . zoster vaccine live, PF, (ZOSTAVAX) 1963335NT/0.65ML injection Inject 19,400 Units into the skin once. 1 each 0    Objective: BP 104/41 mmHg  Pulse 58  Temp(Src) 97.5 F (36.4 C) (Oral)  Resp 18  SpO2 91% Exam: General: Disheveled elderly man, laying in bed. Attempts to get out of bed without assistance. NAD HEENT: NCAT, PERRL, EOMI, MMM, OP clear, dentures in place Cardiovascular: bradycardic, regular rhthym, no m/r/g. 1+ DP pulses b/l Respiratory: CTAB, no w/r/c. Normal WOB. [place] in place initially, but breathing comfortably without it later Abdomen: Obese, soft, diffusely moderately TTP. No rebound/guarding. Cannot appreciate HSM, but limited by habitus. +BS Extremities/ Skin: 2+ pitting edema b/l to knees, chronic venous stasis changes over b/l shins. Some tenderness when checking for edema b/l,  Neuro: CN 2-12 intact. Alert & Ox3. Strength intact. Sensation intact in UEs, peripheral neuropathy in LEs. Unsteady upon standing but rights himself.  Labs and Imaging: CBC BMET   Recent Labs Lab 04/12/14 1622  WBC 9.6  HGB 12.9*  HCT 39.0  PLT 171    Recent Labs Lab 04/12/14 1622  NA 140  K 4.6  CL 108  CO2 22  BUN 21  CREATININE 1.42*  GLUCOSE 116*  CALCIUM 9.0     Urinalysis    Component Value Date/Time   COLORURINE YELLOW 04/12/2014 1806   APPEARANCEUR CLEAR 04/12/2014 1806   LABSPEC 1.021 04/12/2014 1806    PHURINE 6.5 04/12/2014 1806   GLUCOSEU NEGATIVE 04/12/2014 1806   HGBUR NEGATIVE 04/12/2014 1806   HGBUR negative 03/06/2010 1419   BILIRUBINUR NEGATIVE 04/12/2014 1806   KETONESUR NEGATIVE 04/12/2014 1806   PROTEINUR NEGATIVE 04/12/2014 1806   UROBILINOGEN 1.0 04/12/2014 1806   NITRITE NEGATIVE 04/12/2014 1806   LEUKOCYTESUR NEGATIVE 04/12/2014 1806    INR 3.6 Lactic acid 1.1 BNP 208.3 Troponin <0.03  EKG (1/20): PACs, LBBB (prev incomplete LBBB), nonischemic  CXR (1/20): Scattered areas of scarring on the left. No edema or consolidation. Mild cardiomegaly, stable.  CT Head (1/20): 1. No acute intracranial abnormalities. 2. Stable atrophy and old left-sided infarct  CT Abd/Pelvis (1/20): Enlargement of the right kidney angiomyolipoma. The lesion now measures up to 10.7 cm and previously measured up to 6.6 cm. An angiomyolipoma of this size is at risk for spontaneous hemorrhage. Recommend urology consultation for management.  No acute abnormality within the abdomen or pelvis.  Stable pulmonary nodules at the lung bases. These are likely benign based on the stability since 2011.  Atherosclerotic disease with stable ectasia in the left external iliac artery.  AnLavon PaganiniMD 04/12/2014, 11:15 PM PGY-1, [place] pager: [phone] pages welcome  Upper Level Addendum:  I have seen and evaluated this patient along with Dr. BaBrita Rompnd reviewed the above note, [facility]  JeClearance CootsMD Family Medicine PGY-2

## 2014-04-12 NOTE — ED Notes (Signed)
Pt states he feels very weak, also reports L sided chest pain radiating to shoulder, and SOB. Wife states he has been very anxious and confused lately. Pt is alert and answering questions appropriately at present.

## 2014-04-12 NOTE — Progress Notes (Signed)
ANTICOAGULATION CONSULT NOTE - Initial Consult  Pharmacy Consult for Coumadin Indication: h/o VTE  Allergies  Allergen Reactions  . Vancomycin Other (See Comments)    "red man syndrome"  . Diazepam Anxiety    REACTION: makes patient cry     Vital Signs: Temp: 97.5 F (36.4 C) (01/20 2235) Temp Source: Oral (01/20 2235) BP: 104/41 mmHg (01/20 2235) Pulse Rate: 58 (01/20 2235)  Labs:  Recent Labs  04/12/14 1531 04/12/14 1622  HGB  --  12.9*  HCT  --  39.0  PLT  --  171  INR 3.6  --   CREATININE  --  1.42*  TROPONINI  --  <0.03    Estimated Creatinine Clearance: 57 mL/min (by C-G formula based on Cr of 1.42).   Medical History: Past Medical History  Diagnosis Date  . Depression   . Diabetes mellitus   . COPD (chronic obstructive pulmonary disease)   . Adhesive capsulitis   . MRSA bacteremia     2011 - possible endocarditis, received 6 weeks IV treatment  . Hyperlipidemia   . Anxiety   . GERD (gastroesophageal reflux disease)   . CAD (coronary artery disease)   . 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 CVA (cerebral infarction) Questionable history  . Diverticulosis   . Osteomyelitis 2012    Sternoclavicular joint   . Chronic back pain   . Angina   . Myocardial infarction   . CHF (congestive heart failure)   . Shortness of breath   . Sleep apnea   . Chronic kidney disease     hx of BPH  . Neuromuscular disorder     HX of diabetic periferal neuropathy  . Hypertension   . Peripheral vascular disease   . On home oxygen therapy     "1.5L prn" (04/12/2014)    Medications:  Prescriptions prior to admission  Medication Sig Dispense Refill Last Dose  . Acetaminophen (TYLENOL ARTHRITIS PAIN PO) Take 1-2 tablets by mouth 2 (two) times daily as needed (for arthritis pain).   Past Month at Unknown time  . aspirin (BABY ASPIRIN) 81 MG chewable tablet Chew 81 mg by mouth daily.    04/12/2014 at Unknown time  . bisoprolol (ZEBETA) 5 MG  tablet Take 0.5 tablets (2.5 mg total) by mouth daily. 90 tablet 3 04/12/2014 at Unknown time  . insulin NPH Human (HUMULIN N,NOVOLIN N) 100 UNIT/ML injection Inject 0.35 mLs (35 Units total) into the skin 2 (two) times daily before a meal. 10 mL 11 04/12/2014 at Unknown time  . lisinopril (PRINIVIL,ZESTRIL) 10 MG tablet Take 1 tablet (10 mg total) by mouth daily. 90 tablet 2 04/12/2014 at Unknown time  . LORazepam (ATIVAN) 0.5 MG tablet TAKE ONE TABLET BY MOUTH AT BEDTIME AS NEEDED FOR ANXIETY 20 tablet 0 Past Week at Unknown time  . nitroGLYCERIN (NITROSTAT) 0.4 MG SL tablet Place 1 tablet (0.4 mg total) under the tongue as needed. For chest pains 30 tablet 6 unknown at unknown  . omeprazole (PRILOSEC) 40 MG capsule TAKE ONE CAPSULE BY MOUTH ONCE DAILY 30 capsule 3 04/12/2014 at Unknown time  . QUEtiapine (SEROQUEL) 50 MG tablet TAKE ONE TABLET BY MOUTH AT BEDTIME 30 tablet 3 04/11/2014 at Unknown time  . simvastatin (ZOCOR) 80 MG tablet TAKE ONE TABLET BY MOUTH AT BEDTIME 90 tablet 3 04/11/2014 at Unknown time  . tamsulosin (FLOMAX) 0.4 MG CAPS capsule Take 1 capsule (0.4 mg total) by mouth daily after  supper. 90 capsule 1 04/12/2014 at Unknown time  . torsemide (DEMADEX) 20 MG tablet TAKE ONE TABLET BY MOUTH EVERY DAY 60 tablet 2 04/12/2014 at Unknown time  . venlafaxine XR (EFFEXOR-XR) 37.5 MG 24 hr capsule TAKE ONE CAPSULE BY MOUTH ONCE DAILY 30 capsule 11 04/12/2014 at Unknown time  . warfarin (COUMADIN) 5 MG tablet Take 2.5-5 mg by mouth See admin instructions. Take a whole tablet everyday except on Mondays and Fridays take half tab   04/12/2014 at Unknown time  . Blood Glucose Monitoring Suppl (ONE TOUCH ULTRA 2) W/DEVICE KIT Please supply patient with 1 glucometer device to use 4 times daily to check blood sugars.  Dx code: 250.0 1 each 1   . glucose blood test strip Use as instructed.  One Step Ultra 2 strips. 100 each 12   . HYDROcodone-acetaminophen (NORCO) 10-325 MG per tablet Take 1 tablet by  mouth every 8 (eight) hours as needed. (Patient not taking: Reported on 04/12/2014) 45 tablet 0 Not Taking at Unknown time  . Insulin Syringe-Needle U-100 (B-D INS SYRINGE 0.5CC/30GX1/2") 30G X 1/2" 0.5 ML MISC For use with insulin 100 each 0   . NEEDLE, DISP, 30 G (B-D DISP NEEDLE 30GX1") 30G X 1" MISC For using with insulin. 100 each 0   . traMADol (ULTRAM) 50 MG tablet TAKE ONE TO TWO TABLETS BY MOUTH EVERY 8 HOURS AS NEEDED (Patient not taking: Reported on 02/08/2014) 30 tablet 1   . warfarin (COUMADIN) 5 MG tablet Take 1-1.5 tablets (5-7.5 mg total) by mouth daily. Take 1 tablet (39m) daily except Sunday then take 1 1/2 tablets (7.566m (Patient not taking: Reported on 04/12/2014) 60 tablet 3 Not Taking at Unknown time  . zoster vaccine live, PF, (ZOSTAVAX) 1970929NT/0.65ML injection Inject 19,400 Units into the skin once. 1 each 0 Taking   Scheduled:  . [START ON 04/13/2014] aspirin  81 mg Oral Daily  . [START ON 04/13/2014] atorvastatin  40 mg Oral q1800  . [START ON 04/13/2014] bisoprolol  2.5 mg Oral Daily  . [START ON 04/13/2014] insulin aspart  0-9 Units Subcutaneous TID WC  . [START ON 04/13/2014] insulin NPH Human  35 Units Subcutaneous BID AC & HS  . [START ON 04/13/2014] pantoprazole  40 mg Oral Daily  . QUEtiapine  50 mg Oral QHS  . sodium chloride  3 mL Intravenous Q12H  . [START ON 04/13/2014] tamsulosin  0.4 mg Oral QPC supper  . [START ON 04/13/2014] torsemide  20 mg Oral Daily  . [START ON 04/13/2014] venlafaxine XR  37.5 mg Oral Q breakfast    Assessment: [id number] c/o weakness and worsening of gait, [facility] shows no acute abnormalities, to continue Coumadin during admission; today's INR was above goal at outpt clinic though had already taken his Coumadin before test.  Goal of Therapy:  INR 2-3   Plan:  Will hold Coumadin for now and monitor INR for dose adjustments.  VeWynona NeatPharmD, BCPS  04/12/2014,11:28 PM

## 2014-04-12 NOTE — Progress Notes (Signed)
Subjective:     Patient ID: [name], male   DOB: March 23, 1941, 74 y.o.   MRN: [id number]  HPI [name] is a 74yo male presenting today with acute worsening of gait, anxiety, and confusion. - Notes sudden onset of symptoms one week ago - Wife notes that he seems more confused at home. Also notes acute worsening of gait. Has not fallen in several weeks, but he has had several close calls. Reports no episodes of syncope - He spends most of his time pacing throughout house and outside. Wife states he cannot sit still. - Gets dizzy when he rises from seated to standing position. Dizziness when looking up.  - Has been taking all of his medications, including Bisoprolol, Ativan, and Seroquel as prescribed. - Complains of severe neck and back pain, but states this is a chronic condition.  Review of Systems  Musculoskeletal: Positive for back pain and neck pain.  Neurological: Positive for dizziness and light-headedness. Negative for syncope, facial asymmetry and numbness.  Psychiatric/Behavioral: Positive for confusion and agitation. The patient is nervous/anxious.       Objective:   Physical Exam  Constitutional: He appears well-developed and well-nourished. No distress.  HENT:  Head: Normocephalic and atraumatic.  Tempanic membranes nonerythematous  Eyes: Pupils are equal, round, and reactive to light.  Neck:  Signficant tenderness noted.   Cardiovascular: Normal rate and regular rhythm.  Exam reveals no gallop and no friction rub.   No murmur heard. Pulmonary/Chest: Effort normal and breath sounds normal. No respiratory distress. He has no wheezes. He has no rales.  Musculoskeletal: He exhibits edema and tenderness.  Neurological:  CN II-XII intact. No cerebellar deficits noted. Numbness of feet and medial portion of left lower leg. Strength 5/5 in upper and lower extremities. Imbalance noted with gait, occasionally falling into wall before righting himself.  Psychiatric:   Appeared anxious throughout exam, asking for door to remain open. Pacing up and down halls in office while waiting.      Assessment:     Please refer to Problem List for Assessment.     Plan:     Please refer to Problem List for Plan.

## 2014-04-12 NOTE — ED Notes (Addendum)
Pt placed on 2l nasal cannula via Dr. Mariam Dollar

## 2014-04-12 NOTE — ED Provider Notes (Signed)
Level V caveat altered mental status. History is obtained from patient and patient's wife. Patient has complained of upper abdominal pain for approximately a week and feeling anxious. His wife reports that he's been somewhat confused. Has been unable to stay still and has been walking almost constantly for the past week. No fever no treatment prior to coming here seen by family practice [facility] prior to coming here and sent here for further evaluation. On exam patient is alert nontoxic Glasgow Coma Score 15 lungs clear auscultation heart regular rate and rhythm abdomen obese tender at upper quadrants. Her extremities with brawny skin changes, neurovascularly intact.   Orlie Dakin, MD 04/12/14 2308

## 2014-04-12 NOTE — Assessment & Plan Note (Addendum)
-   Likely multifactorial. Suspect orthostatic hypotension and peripheral neuropathy contributing.  - Instructed to present to Emergency Department for labs and CT to rule out acute CVA or other emergent etiology as contributing to symptoms  - Orthostatic vital signs:  - Laying: 117/69, 84  - Sitting 101/66, 63  - Standing 110/69, 62 - INR: 3.6 today. Decreased from 4 on 03/28/14  - Concern for increased risk of bleeding
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
## 2014-04-12 NOTE — Progress Notes (Signed)
Received pt report from Yasemia,RN-ED. 

## 2014-04-13 ENCOUNTER — Ambulatory Visit: Payer: 59 | Admitting: Family Medicine

## 2014-04-13 DIAGNOSIS — R4182 Altered mental status, unspecified: Principal | ICD-10-CM

## 2014-04-13 DIAGNOSIS — R410 Disorientation, unspecified: Secondary | ICD-10-CM | POA: Diagnosis not present

## 2014-04-13 LAB — CBC
HCT: 38.3 % — ABNORMAL LOW (ref 39.0–52.0)
HEMOGLOBIN: 12.4 g/dL — AB (ref 13.0–17.0)
MCH: 27.1 pg (ref 26.0–34.0)
MCHC: 32.4 g/dL (ref 30.0–36.0)
MCV: 83.6 fL (ref 78.0–100.0)
Platelets: 148 10*3/uL — ABNORMAL LOW (ref 150–400)
RBC: 4.58 MIL/uL (ref 4.22–5.81)
RDW: 17.4 % — ABNORMAL HIGH (ref 11.5–15.5)
WBC: 8 10*3/uL (ref 4.0–10.5)

## 2014-04-13 LAB — RAPID URINE DRUG SCREEN, HOSP PERFORMED
AMPHETAMINES: NOT DETECTED
Barbiturates: NOT DETECTED
Benzodiazepines: POSITIVE — AB
Cocaine: NOT DETECTED
OPIATES: POSITIVE — AB
Tetrahydrocannabinol: NOT DETECTED

## 2014-04-13 LAB — PROTIME-INR
INR: 3.08 — ABNORMAL HIGH (ref 0.00–1.49)
PROTHROMBIN TIME: 32 s — AB (ref 11.6–15.2)

## 2014-04-13 LAB — BASIC METABOLIC PANEL
ANION GAP: 9 (ref 5–15)
BUN: 16 mg/dL (ref 6–23)
CO2: 23 mmol/L (ref 19–32)
CREATININE: 1.07 mg/dL (ref 0.50–1.35)
Calcium: 8.6 mg/dL (ref 8.4–10.5)
Chloride: 108 mEq/L (ref 96–112)
GFR calc non Af Amer: 66 mL/min — ABNORMAL LOW (ref >90)
GFR, EST AFRICAN AMERICAN: 76 mL/min — AB (ref >90)
Glucose, Bld: 83 mg/dL (ref 70–99)
POTASSIUM: 4 mmol/L (ref 3.5–5.1)
Sodium: 140 mmol/L (ref 135–145)

## 2014-04-13 LAB — MRSA PCR SCREENING: MRSA BY PCR: NEGATIVE

## 2014-04-13 LAB — AMMONIA: Ammonia: 44 umol/L — ABNORMAL HIGH (ref 11–32)

## 2014-04-13 LAB — GLUCOSE, CAPILLARY
Glucose-Capillary: 89 mg/dL (ref 70–99)
Glucose-Capillary: 140 mg/dL — ABNORMAL HIGH (ref 70–99)

## 2014-04-13 MED ORDER — TRAMADOL HCL 50 MG PO TABS
50.0000 mg | ORAL_TABLET | Freq: Three times a day (TID) | ORAL | Status: DC | PRN
  Administered 2014-04-13: 50 mg via ORAL
  Filled 2014-04-13: qty 1

## 2014-04-13 NOTE — Discharge Instructions (Signed)
You were admitted for changes in your mental status. While in the hospital you improved and were found to be okay to go home. A CT imaging test of your abdomen was done which showed a known mass called an angiomyolipoma has gotten a little bigger in size, at your outpatient follow up at the [facility] please discuss getting a referral to a urologist for this to be evaluated.   We will ask a home health physical therapist to see you after leaving the hospital.

## 2014-04-13 NOTE — Care Management Note (Signed)
    Page 1 of 1   04/13/2014     3:31:38 PM CARE MANAGEMENT NOTE 04/13/2014  Patient:  [name], [name]   Account Number:  [id number]  Date Initiated:  04/13/2014  Documentation initiated by:  Tomi Bamberger  Subjective/Objective Assessment:   dx delirium  admit-lives with spouse.     Action/Plan:   pt eval- rec no hhpt-  md has order in for hhpt   Anticipated DC Date:  04/13/2014   Anticipated DC Plan:  [place]  CM consult      [facility] Choice  HOME HEALTH   Choice offered to / List presented to:  C-1 Patient        [place] arranged  HH-2 PT      [place]   Status of service:  Completed, signed off Medicare Important Message given?  NA - LOS <3 / Initial given by admissions (If response is "NO", the following Medicare IM given date fields will be blank) Date Medicare IM given:   Medicare IM given by:   Date Additional Medicare IM given:   Additional Medicare IM given by:    Discharge Disposition:  [facility]  Per UR Regulation:  Reviewed for med. necessity/level of care/duration of stay  If discussed at [place] of Stay Meetings, dates discussed:    Comments:  04/13/14 [place], BSN [phone] patient is for dc today, he chose [facility] for hhpt,  MD put order in for hhpt.  Referral made to Sherrin Daisy notified.  Soc will begin 24-48 hrs post dc.

## 2014-04-13 NOTE — Progress Notes (Signed)
Family Medicine Teaching Service Daily Progress Note Intern Pager: [phone]  Patient name: [name] Medical record number: [id number] Date of birth: 03-27-1940 Age: 74 y.o. Gender: male  Primary Care Provider: [name], MD Consultants: none Code Status: full code  Pt Overview and Major Events to Date:  1/20 - admit for agitation and ?AMS  Assessment and Plan: [name] is a 74 y.o. male presenting with agitation, concern for AMS. PMH is significant for HLD, HTN, HFrEF, BPH, OSA, Depression/anxiety, T2DM, PVD, COPD, diabetic neuropathy, orthostatic hypotension, h/o PE on chronic anticoagulation, h/o CVA.  Altered mental status, agitation, unsteady gait: Appears to be much improved as compared to being seen in clinic. Doubt infection as etiology (WBC wnl, afebrile, VSS, CXR and CT abd/pelvis without evidence of infection). Doubt CVA - CT head neg for acute infarct, neuro exam nonfocal. Likely medication induced (on norco, effexor, ativan and seroquel) vs worsening of depression/anxiety. Patient could have some delirium on underlying cognitive deficits. LFTs wnl, troponin neg, EKG nonischemic. At baseline his gait  - monitor for signs of infection - Continuing home ativan, effexor, and seroquel - could consider holding as these are possible culprits - Holding home Norco - consider psych consult - this could be related to worsening of depression/anxiety - likely to benefit from OP f/u - f/u UDS and ammonia - consider MMSE in the AM  AKI: Resolved. Baseline Cr 0.9-1.2. Cr 1.42 on admission. Could be related to decreased PO intake recently. - s/p 500cc bolus in the ED - Continue to monitor - 1.07 this AM  R Renal angiomyolipoma: Noted enlargement from 6.6 to 10.7 cm on CT abd/pelvis. - OP Urology follow-up for monitoring  HFrEF, HLD, HTN: Last Echo 2/15 with EF 40-45%, severe hypokinesis consistent with infarction of RCA, normal diastolic function, calcified MV annulus. +LE  edema, but lungs clear. No pulm edema on CXR. Currently normotensive. - Continue home ASA, lipitor 40, bisoprolol 2.5mg  daily, torsemide 20mg  daily -Monitor fluid status carefully  Depression/anxiety: See AMS problem above - Continue home ativan, Effexor, and seroquel  T2DM: Uncontrolled. Glucose 116 on BMET from ED. Last A1c 8.7 (12/09/13). - Continue home NPH 35 units BID - montior CBGs - well controlled O/N - sens SSI  Chronic pain/: s/p hip replacement, h/o b/l frozen shoulders -Holding home Norco, given AMS - Restart home tramadol this AM  H/o prior PE, on anticoagulation: has had falls in the past but treatment outweighs the risk of a bleed with a fall  - coumadin per pharm.   FEN/GI: Heart healthy/carb modified diet, SLIV Prophylaxis: Coumadin per pharm  Disposition: Pending improvement, possibly later today  Subjective:  Feels about the same today. Doesn't feel confused. Stayed in bed but didn't sleep overnight.  Objective: Temp:  [97.5 F (36.4 C)-98 F (36.7 C)] 97.8 F (36.6 C) (01/21 0601) Pulse Rate:  [54-101] 57 (01/21 0601) Resp:  [15-18] 18 (01/21 0601) BP: (92-138)/(33-75) 102/47 mmHg (01/21 0601) SpO2:  [89 %-100 %] 93 % (01/21 0601) Weight:  [267 lb 8 oz (121.337 kg)-268 lb 6.4 oz (121.745 kg)] 267 lb 8 oz (121.337 kg) (01/20 2235) Physical Exam: General: Disheveled elderly man, laying in bed. NAD Cardiovascular: RRR, no m/r/g. 1+ DP pulses b/l Respiratory: CTAB, no w/r/c. Normal WOB. On RA Abdomen: Obese, soft, diffusely moderately TTP. No rebound/guarding. Cannot appreciate HSM, but limited by habitus. +BS Extremities/ Skin: 2+ pitting edema b/l to knees, chronic venous stasis changes over b/l shins. Some tenderness when checking for edema b/l  Neuro: CN 2-12 intact. Alert & Ox3. Strength intact. Sensation intact in UEs, peripheral neuropathy in LEs.   Laboratory:  Recent Labs Lab 04/12/14 1622 04/13/14 0544  WBC 9.6 8.0  HGB 12.9* 12.4*  HCT  39.0 38.3*  PLT 171 148*    Recent Labs Lab 04/12/14 1622 04/13/14 0544  NA 140 140  K 4.6 4.0  CL 108 108  CO2 22 23  BUN 21 16  CREATININE 1.42* 1.07  CALCIUM 9.0 8.6  PROT 6.3  --   BILITOT 0.6  --   ALKPHOS 53  --   ALT 16  --   AST 21  --   GLUCOSE 116* 83    INR 3.6 Lactic acid 1.1 BNP 208.3 Troponin <0.03  Imaging/Diagnostic Tests: EKG (1/20): PACs, LBBB (prev incomplete LBBB), nonischemic  CXR (1/20): Scattered areas of scarring on the left. No edema or consolidation. Mild cardiomegaly, stable.  CT Head (1/20): 1. No acute intracranial abnormalities. 2. Stable atrophy and old left-sided infarct  CT Abd/Pelvis (1/20): Enlargement of the right kidney angiomyolipoma. The lesion now measures up to 10.7 cm and previously measured up to 6.6 cm. An angiomyolipoma of this size is at risk for spontaneous hemorrhage. Recommend urology consultation for management.  No acute abnormality within the abdomen or pelvis.  Stable pulmonary nodules at the lung bases. These are likely benign based on the stability since 2011.  Atherosclerotic disease with stable ectasia in the left external iliac artery.  Lavon Paganini, MD 04/13/2014, 9:32 AM PGY-1, [place] Intern pager: [phone], text pages welcome

## 2014-04-13 NOTE — Progress Notes (Signed)
UR completed 

## 2014-04-13 NOTE — Progress Notes (Signed)
[facility]ed to be D/C'd Home per MD order.  Discussed with the patient and all questions fully answered.    Medication List    STOP taking these medications        HYDROcodone-acetaminophen 10-325 MG per tablet  Commonly known as:  NORCO      TAKE these medications        BABY ASPIRIN 81 MG chewable tablet  Generic drug:  aspirin  Chew 81 mg by mouth daily.     bisoprolol 5 MG tablet  Commonly known as:  ZEBETA  Take 0.5 tablets (2.5 mg total) by mouth daily.     glucose blood test strip  Use as instructed.  One Step Ultra 2 strips.     insulin NPH Human 100 UNIT/ML injection  Commonly known as:  HUMULIN N,NOVOLIN N  Inject 0.35 mLs (35 Units total) into the skin 2 (two) times daily before a meal.     Insulin Syringe-Needle U-100 30G X 1/2" 0.5 ML Misc  Commonly known as:  B-D INS SYRINGE 0.5CC/30GX1/2"  For use with insulin     lisinopril 10 MG tablet  Commonly known as:  PRINIVIL,ZESTRIL  Take 1 tablet (10 mg total) by mouth daily.     LORazepam 0.5 MG tablet  Commonly known as:  ATIVAN  TAKE ONE TABLET BY MOUTH AT BEDTIME AS NEEDED FOR ANXIETY     NEEDLE (DISP) 30 G 30G X 1" Misc  Commonly known as:  B-D DISP NEEDLE 30GX1"  For using with insulin.     nitroGLYCERIN 0.4 MG SL tablet  Commonly known as:  NITROSTAT  Place 1 tablet (0.4 mg total) under the tongue as needed. For chest pains     omeprazole 40 MG capsule  Commonly known as:  PRILOSEC  TAKE ONE CAPSULE BY MOUTH ONCE DAILY     ONE TOUCH ULTRA 2 W/DEVICE Kit  Please supply patient with 1 glucometer device to use 4 times daily to check blood sugars.  Dx code: 250.0     QUEtiapine 50 MG tablet  Commonly known as:  SEROQUEL  TAKE ONE TABLET BY MOUTH AT BEDTIME     simvastatin 80 MG tablet  Commonly known as:  ZOCOR  TAKE ONE TABLET BY MOUTH AT BEDTIME     tamsulosin 0.4 MG Caps capsule  Commonly known as:  FLOMAX  Take 1 capsule (0.4 mg total) by mouth daily after supper.     torsemide 20  MG tablet  Commonly known as:  DEMADEX  TAKE ONE TABLET BY MOUTH EVERY DAY     traMADol 50 MG tablet  Commonly known as:  ULTRAM  TAKE ONE TO TWO TABLETS BY MOUTH EVERY 8 HOURS AS NEEDED     TYLENOL ARTHRITIS PAIN PO  Take 1-2 tablets by mouth 2 (two) times daily as needed (for arthritis pain).     venlafaxine XR 37.5 MG 24 hr capsule  Commonly known as:  EFFEXOR-XR  TAKE ONE CAPSULE BY MOUTH ONCE DAILY     warfarin 5 MG tablet  Commonly known as:  COUMADIN  Take 2.5-5 mg by mouth See admin instructions. Take a whole tablet everyday except on Mondays and Fridays take half tab     zoster vaccine live (PF) 19400 UNT/0.65ML injection  Commonly known as:  ZOSTAVAX  Inject 19,400 Units into the skin once.        VVS, Skin clean, dry and intact without evidence of skin break down, no evidence of skin tears  noted. IV catheter discontinued intact. Site without signs and symptoms of complications. Dressing and pressure applied.  An After Visit Summary was printed and given to the patient.  D/c education completed with patient/family including follow up instructions, medication list, d/c activities limitations if indicated, with other d/c instructions as indicated by MD - patient able to verbalize understanding, all questions fully answered.   Patient instructed to return to ED, call 911, or call MD for any changes in condition.   Patient escorted via [place], and D/C home via private auto.  Brentyn Seehafer D 04/13/2014 4:00 PM

## 2014-04-13 NOTE — Progress Notes (Signed)
[name] [id number] Admission Data: 04/13/2014 12:32 AM Attending Provider: Alveda Reasons, MD UMP:[id number],RXVQ, MD Code Status: Full  [name] is a 74 y.o. male patient admitted from ED:  -No acute distress noted.  -No complaints of shortness of breath.  -No complaints of chest pain.   Cardiac Monitoring: Box # n/a in place. Cardiac monitor yields:n/a.  Blood pressure 104/41, pulse 58, temperature 97.5 F (36.4 C), temperature source Oral, resp. rate 18, height 5\' 8"  (1.727 m), weight 121.337 kg (267 lb 8 oz), SpO2 91 %.   IV Fluids:  IV in place, occlusive dsg intact without redness, IV cath forearm right, condition patent and no redness none.   Allergies:  Vancomycin and Diazepam  Past Medical History:   has a past medical history of Depression; Diabetes mellitus; COPD (chronic obstructive pulmonary disease); Adhesive capsulitis; MRSA bacteremia; Hyperlipidemia; Anxiety; GERD (gastroesophageal reflux disease); CAD (coronary artery disease); PE (pulmonary embolism) (10-15 years ago); CVA (cerebral infarction) (Questionable history); Diverticulosis; Osteomyelitis (2012); Chronic back pain; Angina; Myocardial infarction; CHF (congestive heart failure); Shortness of breath; Sleep apnea; Chronic kidney disease; Neuromuscular disorder; Hypertension; Peripheral vascular disease; and On home oxygen therapy.  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  Social History:   reports that he quit smoking about 13 years ago. He has never used  smokeless tobacco. He reports that he does not drink alcohol or use illicit drugs.  Skin: charted on CHL  Patient/Family orientated to room. Information packet given to patient/family. Admission inpatient armband information verified with patient/family to include name and date of birth and placed on patient arm. Side rails up x 2, fall assessment and education completed with patient/family. Patient/family able to verbalize understanding of risk associated with falls and verbalized understanding to call for assistance before getting out of bed. Call light within reach. Patient/family able to voice and demonstrate understanding of unit orientation instructions.

## 2014-04-13 NOTE — Evaluation (Addendum)
Physical Therapy Evaluation Patient Details Name: [name] MRN: [id number] DOB: 30-Apr-1940 Today's Date: 04/13/2014   History of Present Illness  [name] is a 74 y.o. male presenting with agitation, concern for AMS. PMH is significant for HLD, HTN, HFrEF, BPH, OSA, Depression/anxiety, T2DM, PVD, COPD, diabetic neuropathy, orthostatic hypotension, h/o PE on chronic anticoagulation, h/o CVA  Clinical Impression  Pt admitted with/for AMS, agitation and gait instability.  Pt currently limited functionally due to the problems listed below.  (see problems list.)  Pt will benefit from PT to maximize function and safety to be able to get home safely with available assist of family.     Follow Up Recommendations Home health PT;Supervision for mobility/OOB    Equipment Recommendations  None recommended by PT    Recommendations for Other Services       Precautions / Restrictions Precautions Precautions: Fall Restrictions Weight Bearing Restrictions: No      Mobility  Bed Mobility Overal bed mobility: Needs Assistance Bed Mobility: Supine to Sit;Sit to Supine     Supine to sit: Supervision Sit to supine: Supervision   General bed mobility comments: Supervision for safety due to impulsivity.   Transfers Overall transfer level: Needs assistance Equipment used: 1 person hand held assist Transfers: Sit to/from Stand Sit to Stand: Min guard         General transfer comment: Min (A) hand held assist. Pt slightly impulsive  Ambulation/Gait Ambulation/Gait assistance: Min guard Ambulation Distance (Feet): 100 Feet Assistive device: Straight cane Gait Pattern/deviations: Step-through pattern Gait velocity: decreased   General Gait Details: step through gait with body arched back and rotated Left.  Little dissociation of upper and lower boday and short stride length.  Stairs            Wheelchair Mobility    Modified Rankin (Stroke Patients Only)        Balance Overall balance assessment: Needs assistance Sitting-balance support: No upper extremity supported Sitting balance-[name]: Good     Standing balance support: No upper extremity supported Standing balance-[name]: Fair                               Pertinent Vitals/Pain Pain Assessment: No/denies pain Pain Score: 4  Faces Pain Scale: Hurts little more Pain Location: multiple joints, shoulders, neck Pain Descriptors / Indicators: Aching Pain Intervention(s): Monitored during session;Repositioned    Home Living Family/patient expects to be discharged to:: Private residence Living Arrangements: Spouse/significant other;Other relatives Available Help at Discharge: Available 24 hours/day Type of Home: House Home Access: Stairs to enter Entrance Stairs-Rails: None Entrance Stairs-Number of Steps: 2 Home Layout: One level Home Equipment: Walker - 2 wheels;Walker - 4 wheels;Cane - single point;Bedside commode;Tub bench;Wheelchair - power      Prior Function Level of Independence: Needs assistance   Gait / Transfers Assistance Needed: history of frequent falls at least one fall every 2 wks  ADL's / Homemaking Assistance Needed: Pt requires mod -max A with bathing; min A for dressing;  Uses scooter for grocery shopping and wife cleans  Comments: used cane mostly for mobility, sometimes rollator     Hand Dominance   Dominant Hand: Right    Extremity/Trunk Assessment   Upper Extremity Assessment: Overall WFL for tasks assessed           Lower Extremity Assessment: Defer to PT evaluation      Cervical / Trunk Assessment: Normal  Communication  Communication: No difficulties  Cognition Arousal/Alertness: Awake/alert Behavior During Therapy: WFL for tasks assessed/performed;Impulsive Overall Cognitive Status: Impaired/Different from baseline Area of Impairment: Safety/judgement;Awareness;Problem solving;Memory;Attention   Current  Attention Level: Sustained Memory: Decreased short-term memory   Safety/Judgement: Decreased awareness of safety;Decreased awareness of deficits Awareness: Emergent Problem Solving: Slow processing;Requires verbal cues;Requires tactile cues      General Comments      Exercises Other Exercises Other Exercises: warm up hip/knee ROM exercise prior to activity due to arthritis/stiffness.      Assessment/Plan    PT Assessment Patient needs continued PT services  PT Diagnosis Abnormality of gait;Generalized weakness;Acute pain   PT Problem List Decreased activity tolerance;Decreased strength;Decreased balance;Pain  PT Treatment Interventions DME instruction;Gait training;Stair training;Functional mobility training;Therapeutic activities;Balance training;Patient/family education   PT Goals (Current goals can be found in the Care Plan section) Acute Rehab PT Goals Patient Stated Goal: get back home PT Goal Formulation: With patient Time For Goal Achievement: 04/20/14 Potential to Achieve Goals: Good    Frequency Min 3X/week   Barriers to discharge        Co-evaluation               End of Session   Activity Tolerance: Patient tolerated treatment well;Patient limited by fatigue Patient left: in bed;with call bell/phone within reach Nurse Communication: Mobility status    Functional Assessment Tool Used: clinical judgement Functional Limitation: Mobility: Walking and moving around Mobility: Walking and Moving Around Current Status (H9977): At least 1 percent but less than 20 percent impaired, limited or restricted Mobility: Walking and Moving Around Goal Status [phone]): At least 1 percent but less than 20 percent impaired, limited or restricted    Time: 1227-1250 PT Time Calculation (min) (ACUTE ONLY): 23 min   Charges:   PT Evaluation $Initial PT Evaluation Tier I: 1 Procedure PT Treatments $Gait Training: 8-22 mins   PT G Codes:   PT G-Codes **NOT FOR  INPATIENT CLASS** Functional Assessment Tool Used: clinical judgement Functional Limitation: Mobility: Walking and moving around Mobility: Walking and Moving Around Current Status (T5320): At least 1 percent but less than 20 percent impaired, limited or restricted Mobility: Walking and Moving Around Goal Status [phone]): At least 1 percent but less than 20 percent impaired, limited or restricted    Ozell Juhasz, Tessie Fass 04/13/2014, 3:37 PM 04/13/2014  Donnella Sham, PT [phone]  (pager)

## 2014-04-13 NOTE — Evaluation (Signed)
Occupational Therapy Evaluation Patient Details Name: [name] MRN: [id number] DOB: 1940-09-26 Today's Date: 04/13/2014    History of Present Illness [name] is a 74 y.o. male presenting with agitation, concern for AMS. PMH is significant for HLD, HTN, HFrEF, BPH, OSA, Depression/anxiety, T2DM, PVD, COPD, diabetic neuropathy, orthostatic hypotension, h/o PE on chronic anticoagulation, h/o CVA   Clinical Impression   PTA pt lived at home and had assistance from his wife for ADLs. Pt reports ambulating with SPC at baseline with use of scooter for community mobility. Pt currently requires min (A) hand held assist for safety and demonstrates decreased safety awareness. Pt will benefit from acute OT to address safety with ADLs and functional mobility.    Follow Up Recommendations  No OT follow up;Supervision/Assistance - 24 hour    Equipment Recommendations  None recommended by OT    Recommendations for Other Services       Precautions / Restrictions Precautions Precautions: Fall Restrictions Weight Bearing Restrictions: No      Mobility Bed Mobility Overal bed mobility: Needs Assistance Bed Mobility: Supine to Sit;Sit to Supine     Supine to sit: Supervision Sit to supine: Supervision   General bed mobility comments: Supervision for safety due to impulsivity.   Transfers Overall transfer level: Needs assistance Equipment used: 1 person hand held assist Transfers: Sit to/from Stand Sit to Stand: Min guard         General transfer comment: Min (A) hand held assist. Pt slightly impulsive    Balance Overall balance assessment: Needs assistance Sitting-balance support: No upper extremity supported Sitting balance-[name]: Good     Standing balance support: No upper extremity supported Standing balance-[name]: Fair                              ADL Overall ADL's : Needs assistance/impaired Eating/Feeding: Independent;Sitting    Grooming: Wash/dry hands;Min guard;Standing   Upper Body Bathing: Set up;Sitting   Lower Body Bathing: Moderate assistance;Sit to/from stand   Upper Body Dressing : Set up;Sitting   Lower Body Dressing: Maximal assistance;Sit to/from stand   Toilet Transfer: Minimal assistance;Ambulation;Comfort height toilet Toilet Transfer Details (indicate cue type and reason): hand held assist for balance Toileting- Clothing Manipulation and Hygiene: Min guard;Sit to/from stand       Functional mobility during ADLs: Minimal assistance (hand held assist) General ADL Comments: Pt mildly unsteady during functional mobility without use of DME. Pt oriented, however demonstrates decreased safety awareness. Pt reported seeing crane outside of window, however unsure whether this is true.        Perception Perception Perception Tested?: No   Praxis Praxis Praxis tested?: Within functional limits    Pertinent Vitals/Pain Pain Assessment: No/denies pain Pain Score: 4  Faces Pain Scale: Hurts little more Pain Location: multiple joints, shoulders, neck Pain Descriptors / Indicators: Aching Pain Intervention(s): Monitored during session;Repositioned     Hand Dominance Right   Extremity/Trunk Assessment Upper Extremity Assessment Upper Extremity Assessment: Overall WFL for tasks assessed   Lower Extremity Assessment Lower Extremity Assessment: Defer to PT evaluation   Cervical / Trunk Assessment Cervical / Trunk Assessment: Normal   Communication Communication Communication: No difficulties   Cognition Arousal/Alertness: Awake/alert Behavior During Therapy: WFL for tasks assessed/performed;Impulsive Overall Cognitive Status: Impaired/Different from baseline Area of Impairment: Safety/judgement;Awareness;Problem solving;Memory;Attention   Current Attention Level: Sustained Memory: Decreased short-term memory   Safety/Judgement: Decreased awareness of safety;Decreased awareness of  deficits Awareness:  Emergent Problem Solving: Slow processing;Requires verbal cues;Requires tactile cues                Home Living Family/patient expects to be discharged to:: Private residence Living Arrangements: Spouse/significant other;Other relatives Available Help at Discharge: Available 24 hours/day Type of Home: House Home Access: Stairs to enter [employer] of Steps: 2 Entrance Stairs-Rails: None Home Layout: One level     Bathroom Shower/Tub: Tub/shower unit;Curtain         Home Equipment: Environmental consultant - 2 wheels;Walker - 4 wheels;Cane - single point;Bedside commode;Tub bench;Wheelchair - power          Prior Functioning/Environment Level of Independence: Needs assistance  Gait / Transfers Assistance Needed: history of frequent falls at least one fall every 2 wks ADL's / Homemaking Assistance Needed: Pt requires mod -max A with bathing; min A for dressing;  Uses scooter for grocery shopping and wife cleans   Comments: used cane mostly for mobility, sometimes rollator    OT Diagnosis: Generalized weakness;Cognitive deficits   OT Problem List: Decreased activity tolerance;Impaired balance (sitting and/or standing);Decreased cognition;Decreased safety awareness   OT Treatment/Interventions: Self-care/ADL training;Therapeutic exercise;Energy conservation;DME and/or AE instruction;Therapeutic activities;Cognitive remediation/compensation;Patient/family education;Balance training    OT Goals(Current goals can be found in the care plan section) Acute Rehab OT Goals Patient Stated Goal: get back home OT Goal Formulation: With patient Time For Goal Achievement: 04/27/14 Potential to Achieve Goals: Good ADL Goals Pt Will Perform Grooming: standing;with modified independence Pt Will Transfer to Toilet: with modified independence;ambulating Pt Will Perform Toileting - Clothing Manipulation and hygiene: with modified independence;sit to/from stand  OT  Frequency: Min 1X/week    End of Session Equipment Utilized During Treatment: Gait belt  Activity Tolerance: Patient tolerated treatment well Patient left: in bed;with call bell/phone within reach;with bed alarm set   Time: 3419-3790 OT Time Calculation (min): 16 min Charges:  OT General Charges $OT Visit: 1 Procedure OT Evaluation $Initial OT Evaluation Tier I: 1 Procedure G-Codes:    Juluis Rainier 02-May-2014, 3:16 PM   Cyndie Chime, OTR/L Occupational Therapist [phone] (pager)

## 2014-04-13 NOTE — Discharge Summary (Signed)
[facility] Discharge Summary  Patient name: [name] Medical record number: [id number] Date of birth: July 02, 1940 Age: 74 y.o. Gender: male Date of Admission: 04/12/2014  Date of Discharge: 04/13/14 Admitting Physician: Todd Niece, MD  Primary Care Provider: [name], MD Consultants: none  Indication for Hospitalization: altered mental status  Discharge Diagnoses/Problem List:  Altered mental status, agitation, unsteady gait AKI R Renal angiomyolipoma HFrEF HLD HTN Depression/anxiety  Disposition: home  Discharge Condition: stable  Discharge Exam: see progress note from day of discharge  Brief Hospital Course: [name] is a 74 y.o. male presenting with agitation, concern for AMS. PMH is significant for HLD, HTN, HFrEF, BPH, OSA, Depression/anxiety, T2DM, PVD, COPD, diabetic neuropathy, orthostatic hypotension, h/o PE on chronic anticoagulation, h/o CVA.  Altered mental status, agitation, unsteady gait: Patient was seen in [facility] on day of admission and sent to ED for AMS, agitation and unsteady gait.  Doubt infection as etiology (WBC wnl, afebrile, VSS, CXR and CT abd/pelvis without evidence of infection). Doubt CVA - CT head neg for acute infarct, neuro exam nonfocal. Likely medication induced (on norco, effexor, ativan and seroquel) vs worsening of depression/anxiety. Patient has history of going several months without taking medications and then resumes them all at once, per PCP.  Patient could have some delirium on underlying cognitive deficits. LFTs wnl, ammonia mildly elevated at 44, troponin neg, EKG nonischemic. At baseline, his gait is unsteady 2/2 peripheral neuropathy and he seems to be able to steady himself upon standing. Home medications continued. Patient seemed to be himself on day of discharge.  AKI: Creatinine 1.42 on admission (Baseline 0.9-1.2). Could be related to decreased PO intake prior to admission.  Given 500cc  bolus in ED.  Resolved (Cr 1.07) prior to discharge.  R Renal angiomyolipoma: Noted enlargement from 6.6 to 10.7 cm on CT abd/pelvis. At risk for spontaneous rupture.  Recommend outpatient urology f/u. - OP Urology follow-up for monitoring  All other chronic medical conditions stable throughout admission and managed with home regimens.  Issues for Follow Up:  - f/u mental status - OP urology f/u for R renal angiomyolipoma  Significant Procedures: none  Significant Labs and Imaging:   Recent Labs Lab 04/12/14 1622 04/13/14 0544  WBC 9.6 8.0  HGB 12.9* 12.4*  HCT 39.0 38.3*  PLT 171 148*    Recent Labs Lab 04/12/14 1622 04/13/14 0544  NA 140 140  K 4.6 4.0  CL 108 108  CO2 22 23  GLUCOSE 116* 83  BUN 21 16  CREATININE 1.42* 1.07  CALCIUM 9.0 8.6  ALKPHOS 53  --   AST 21  --   ALT 16  --   ALBUMIN 3.8  --     Drugs of Abuse     Component Value Date/Time   LABOPIA POSITIVE* 04/12/2014 1806   COCAINSCRNUR NONE DETECTED 04/12/2014 1806   LABBENZ POSITIVE* 04/12/2014 1806   AMPHETMU NONE DETECTED 04/12/2014 1806   THCU NONE DETECTED 04/12/2014 1806   LABBARB NONE DETECTED 04/12/2014 1806    Urinalysis    Component Value Date/Time   COLORURINE YELLOW 04/12/2014 1806   APPEARANCEUR CLEAR 04/12/2014 1806   LABSPEC 1.021 04/12/2014 1806   PHURINE 6.5 04/12/2014 1806   GLUCOSEU NEGATIVE 04/12/2014 1806   HGBUR NEGATIVE 04/12/2014 1806   HGBUR negative 03/06/2010 1419   BILIRUBINUR NEGATIVE 04/12/2014 1806   KETONESUR NEGATIVE 04/12/2014 1806   PROTEINUR NEGATIVE 04/12/2014 1806   UROBILINOGEN 1.0 04/12/2014 1806   NITRITE  NEGATIVE 04/12/2014 1806   LEUKOCYTESUR NEGATIVE 04/12/2014 1806     Ammonia 44 INR 3.6 Lactic acid 1.1 BNP 208.3 Troponin <0.03  Imaging/Diagnostic Tests: EKG (1/20): PACs, LBBB (prev incomplete LBBB), nonischemic  CXR (1/20): Scattered areas of scarring on the left. No edema or consolidation. Mild cardiomegaly, stable.  CT  Head (1/20): 1. No acute intracranial abnormalities. 2. Stable atrophy and old left-sided infarct  CT Abd/Pelvis (1/20): Enlargement of the right kidney angiomyolipoma. The lesion now measures up to 10.7 cm and previously measured up to 6.6 cm. An angiomyolipoma of this size is at risk for spontaneous hemorrhage. Recommend urology consultation for management.  No acute abnormality within the abdomen or pelvis.  Stable pulmonary nodules at the lung bases. These are likely benign based on the stability since 2011.  Atherosclerotic disease with stable ectasia in the left external iliac artery.  Results/Tests Pending at Time of Discharge: none  Discharge Medications:    Medication List    STOP taking these medications        HYDROcodone-acetaminophen 10-325 MG per tablet  Commonly known as:  NORCO      TAKE these medications        BABY ASPIRIN 81 MG chewable tablet  Generic drug:  aspirin  Chew 81 mg by mouth daily.     bisoprolol 5 MG tablet  Commonly known as:  ZEBETA  Take 0.5 tablets (2.5 mg total) by mouth daily.     glucose blood test strip  Use as instructed.  One Step Ultra 2 strips.     insulin NPH Human 100 UNIT/ML injection  Commonly known as:  HUMULIN N,NOVOLIN N  Inject 0.35 mLs (35 Units total) into the skin 2 (two) times daily before a meal.     Insulin Syringe-Needle U-100 30G X 1/2" 0.5 ML Misc  Commonly known as:  B-D INS SYRINGE 0.5CC/30GX1/2"  For use with insulin     lisinopril 10 MG tablet  Commonly known as:  PRINIVIL,ZESTRIL  Take 1 tablet (10 mg total) by mouth daily.     LORazepam 0.5 MG tablet  Commonly known as:  ATIVAN  TAKE ONE TABLET BY MOUTH AT BEDTIME AS NEEDED FOR ANXIETY     NEEDLE (DISP) 30 G 30G X 1" Misc  Commonly known as:  B-D DISP NEEDLE 30GX1"  For using with insulin.     nitroGLYCERIN 0.4 MG SL tablet  Commonly known as:  NITROSTAT  Place 1 tablet (0.4 mg total) under the tongue as needed. For chest pains      omeprazole 40 MG capsule  Commonly known as:  PRILOSEC  TAKE ONE CAPSULE BY MOUTH ONCE DAILY     ONE TOUCH ULTRA 2 W/DEVICE Kit  Please supply patient with 1 glucometer device to use 4 times daily to check blood sugars.  Dx code: 250.0     QUEtiapine 50 MG tablet  Commonly known as:  SEROQUEL  TAKE ONE TABLET BY MOUTH AT BEDTIME     simvastatin 80 MG tablet  Commonly known as:  ZOCOR  TAKE ONE TABLET BY MOUTH AT BEDTIME     tamsulosin 0.4 MG Caps capsule  Commonly known as:  FLOMAX  Take 1 capsule (0.4 mg total) by mouth daily after supper.     torsemide 20 MG tablet  Commonly known as:  DEMADEX  TAKE ONE TABLET BY MOUTH EVERY DAY     traMADol 50 MG tablet  Commonly known as:  ULTRAM  TAKE ONE TO TWO TABLETS  BY MOUTH EVERY 8 HOURS AS NEEDED     TYLENOL ARTHRITIS PAIN PO  Take 1-2 tablets by mouth 2 (two) times daily as needed (for arthritis pain).     venlafaxine XR 37.5 MG 24 hr capsule  Commonly known as:  EFFEXOR-XR  TAKE ONE CAPSULE BY MOUTH ONCE DAILY     warfarin 5 MG tablet  Commonly known as:  COUMADIN  Take 2.5-5 mg by mouth See admin instructions. Take a whole tablet everyday except on Mondays and Fridays take half tab     zoster vaccine live (PF) 19400 UNT/0.65ML injection  Commonly known as:  ZOSTAVAX  Inject 19,400 Units into the skin once.        Discharge Instructions: Please refer to Patient Instructions section of EMR for full details.  Patient was counseled important signs and symptoms that should prompt return to medical care, changes in medications, dietary instructions, activity restrictions, and follow up appointments.   Follow-Up Appointments: Follow-up Information    Follow up with Lavon Paganini, MD On 04/25/2014.   Specialty:  Family Medicine   Why:  at 2:30PM for LAB VISIT/COUMADIN CHECK, at 3PM with Dr. Brita Romp for hospital follow up   Contact information:   [address] [place] [address] [phone]       Follow up  with [facility].   Why:  hhpt   Contact information:   [address] [place]  [address] [phone]       Lavon Paganini, MD 04/14/2014, 8:56 PM PGY-1, [place]

## 2014-04-14 LAB — URINE CULTURE: Colony Count: 50000

## 2014-04-18 ENCOUNTER — Telehealth: Payer: Self-pay | Admitting: *Deleted

## 2014-04-18 ENCOUNTER — Telehealth: Payer: Self-pay | Admitting: Family Medicine

## 2014-04-18 DIAGNOSIS — I2510 Atherosclerotic heart disease of native coronary artery without angina pectoris: Secondary | ICD-10-CM | POA: Diagnosis not present

## 2014-04-18 DIAGNOSIS — E1165 Type 2 diabetes mellitus with hyperglycemia: Secondary | ICD-10-CM | POA: Diagnosis not present

## 2014-04-18 DIAGNOSIS — F411 Generalized anxiety disorder: Secondary | ICD-10-CM | POA: Diagnosis not present

## 2014-04-18 DIAGNOSIS — E1140 Type 2 diabetes mellitus with diabetic neuropathy, unspecified: Secondary | ICD-10-CM | POA: Diagnosis not present

## 2014-04-18 DIAGNOSIS — J449 Chronic obstructive pulmonary disease, unspecified: Secondary | ICD-10-CM | POA: Diagnosis not present

## 2014-04-18 DIAGNOSIS — I5022 Chronic systolic (congestive) heart failure: Secondary | ICD-10-CM | POA: Diagnosis not present

## 2014-04-18 DIAGNOSIS — G8929 Other chronic pain: Secondary | ICD-10-CM | POA: Diagnosis not present

## 2014-04-18 DIAGNOSIS — F329 Major depressive disorder, single episode, unspecified: Secondary | ICD-10-CM | POA: Diagnosis not present

## 2014-04-18 DIAGNOSIS — M6281 Muscle weakness (generalized): Secondary | ICD-10-CM | POA: Diagnosis not present

## 2014-04-18 DIAGNOSIS — R2689 Other abnormalities of gait and mobility: Secondary | ICD-10-CM | POA: Diagnosis not present

## 2014-04-18 NOTE — Telephone Encounter (Signed)
Arbie Cookey, RN with [facility] called to inform the provider that they received the referral for home physical therapy and nursing.  They were unable to see pt on Friday 04/14/14 due to weather, but was going to open up the case today.  If you have questions please give her a call at [phone].  Derl Barrow, RN

## 2014-04-18 NOTE — Telephone Encounter (Signed)
Randall Hiss called and is requesting verbal orders for the patient for his home PT. He would like to do 2 times a week for 4 weeks. Please call Randall Hiss at [phone]. jw

## 2014-04-18 NOTE — Telephone Encounter (Signed)
The will see patient two times a week for three weeks then once a week for six weeks.  Derl Barrow, RN

## 2014-04-19 NOTE — Telephone Encounter (Signed)
Forwarding to clinic staff.  Please call and ok these orders.

## 2014-04-20 DIAGNOSIS — G8929 Other chronic pain: Secondary | ICD-10-CM | POA: Diagnosis not present

## 2014-04-20 DIAGNOSIS — M6281 Muscle weakness (generalized): Secondary | ICD-10-CM | POA: Diagnosis not present

## 2014-04-20 DIAGNOSIS — E1140 Type 2 diabetes mellitus with diabetic neuropathy, unspecified: Secondary | ICD-10-CM | POA: Diagnosis not present

## 2014-04-20 DIAGNOSIS — R2689 Other abnormalities of gait and mobility: Secondary | ICD-10-CM | POA: Diagnosis not present

## 2014-04-20 DIAGNOSIS — F329 Major depressive disorder, single episode, unspecified: Secondary | ICD-10-CM | POA: Diagnosis not present

## 2014-04-20 DIAGNOSIS — I2510 Atherosclerotic heart disease of native coronary artery without angina pectoris: Secondary | ICD-10-CM | POA: Diagnosis not present

## 2014-04-20 DIAGNOSIS — I5022 Chronic systolic (congestive) heart failure: Secondary | ICD-10-CM | POA: Diagnosis not present

## 2014-04-20 DIAGNOSIS — J449 Chronic obstructive pulmonary disease, unspecified: Secondary | ICD-10-CM | POA: Diagnosis not present

## 2014-04-20 DIAGNOSIS — F411 Generalized anxiety disorder: Secondary | ICD-10-CM | POA: Diagnosis not present

## 2014-04-20 DIAGNOSIS — E1165 Type 2 diabetes mellitus with hyperglycemia: Secondary | ICD-10-CM | POA: Diagnosis not present

## 2014-04-20 NOTE — Telephone Encounter (Signed)
Gave verbal ok per Dr. Mingo Amber

## 2014-04-21 ENCOUNTER — Encounter: Payer: Self-pay | Admitting: Family Medicine

## 2014-04-21 ENCOUNTER — Ambulatory Visit
Admission: RE | Admit: 2014-04-21 | Discharge: 2014-04-21 | Disposition: A | Discharge status: Home / Self Care | Payer: Medicare Other | Source: Ambulatory Visit | Attending: Family Medicine | Admitting: Family Medicine

## 2014-04-21 ENCOUNTER — Ambulatory Visit (INDEPENDENT_AMBULATORY_CARE_PROVIDER_SITE_OTHER): Payer: Medicare Other | Admitting: Family Medicine

## 2014-04-21 ENCOUNTER — Ambulatory Visit (INDEPENDENT_AMBULATORY_CARE_PROVIDER_SITE_OTHER): Payer: Medicare Other | Admitting: *Deleted

## 2014-04-21 VITALS — BP 115/60 | HR 60 | Temp 97.8°F | Ht 68.0 in | Wt 264.4 lb

## 2014-04-21 DIAGNOSIS — I951 Orthostatic hypotension: Secondary | ICD-10-CM

## 2014-04-21 DIAGNOSIS — I5022 Chronic systolic (congestive) heart failure: Secondary | ICD-10-CM | POA: Diagnosis not present

## 2014-04-21 DIAGNOSIS — T148XXA Other injury of unspecified body region, initial encounter: Secondary | ICD-10-CM

## 2014-04-21 DIAGNOSIS — S59901A Unspecified injury of right elbow, initial encounter: Secondary | ICD-10-CM | POA: Diagnosis not present

## 2014-04-21 DIAGNOSIS — E1165 Type 2 diabetes mellitus with hyperglycemia: Secondary | ICD-10-CM | POA: Diagnosis not present

## 2014-04-21 DIAGNOSIS — F329 Major depressive disorder, single episode, unspecified: Secondary | ICD-10-CM | POA: Diagnosis not present

## 2014-04-21 DIAGNOSIS — Z7901 Long term (current) use of anticoagulants: Secondary | ICD-10-CM

## 2014-04-21 DIAGNOSIS — M25521 Pain in right elbow: Secondary | ICD-10-CM

## 2014-04-21 DIAGNOSIS — T148 Other injury of unspecified body region: Secondary | ICD-10-CM

## 2014-04-21 DIAGNOSIS — R2681 Unsteadiness on feet: Secondary | ICD-10-CM

## 2014-04-21 DIAGNOSIS — E1140 Type 2 diabetes mellitus with diabetic neuropathy, unspecified: Secondary | ICD-10-CM | POA: Diagnosis not present

## 2014-04-21 DIAGNOSIS — F411 Generalized anxiety disorder: Secondary | ICD-10-CM | POA: Diagnosis not present

## 2014-04-21 DIAGNOSIS — G8929 Other chronic pain: Secondary | ICD-10-CM | POA: Diagnosis not present

## 2014-04-21 DIAGNOSIS — I2510 Atherosclerotic heart disease of native coronary artery without angina pectoris: Secondary | ICD-10-CM | POA: Diagnosis not present

## 2014-04-21 DIAGNOSIS — M6281 Muscle weakness (generalized): Secondary | ICD-10-CM | POA: Diagnosis not present

## 2014-04-21 DIAGNOSIS — R2689 Other abnormalities of gait and mobility: Secondary | ICD-10-CM | POA: Diagnosis not present

## 2014-04-21 DIAGNOSIS — J449 Chronic obstructive pulmonary disease, unspecified: Secondary | ICD-10-CM | POA: Diagnosis not present

## 2014-04-21 LAB — CBC
HEMATOCRIT: 43.6 % (ref 39.0–52.0)
Hemoglobin: 13.9 g/dL (ref 13.0–17.0)
MCH: 26 pg (ref 26.0–34.0)
MCHC: 31.9 g/dL (ref 30.0–36.0)
MCV: 81.5 fL (ref 78.0–100.0)
MPV: 10.8 fL (ref 8.6–12.4)
Platelets: 189 10*3/uL (ref 150–400)
RBC: 5.35 MIL/uL (ref 4.22–5.81)
RDW: 16.9 % — AB (ref 11.5–15.5)
WBC: 10.8 10*3/uL — ABNORMAL HIGH (ref 4.0–10.5)

## 2014-04-21 LAB — POCT INR: INR: 2.9

## 2014-04-21 NOTE — Progress Notes (Signed)
  Subjective:     [name] is a 74 y.o. male who presents for evaluation of pre-syncope. The symptoms started several weeks ago and have been stable.  Pt states that he gets lightheaded when standing up and does have hx of orthostatic hypotension.  Denies specific weakness, HA, CP, SOB, blurred vision, dysarthria, or dysphagia.  Did hit his R elbow/abdomen on chair when trying to sit.  Denies abdominal pain, N/V/D, fever, chills, or sweats.   The following portions of the patient's history were reviewed and updated as appropriate: allergies, current medications, past family history, past medical history, past social history, past surgical history and problem list.  Review of Systems Pertinent items are noted in HPI.    Objective:    There were no vitals taken for this visit. General appearance: alert, cooperative and appears stated age Head: Normocephalic, without obvious abnormality, atraumatic Lungs: clear to auscultation bilaterally Heart: regular rate and rhythm Neurologic: Alert and oriented X 3, normal strength and tone. Normal symmetric reflexes. Normal coordination and gait Gait: slow but steady with wide base.  Able to ambulate 50 ft w/ cane   Skin/MSK: + contusion R elbow with slight TTP.  Scattered purpura and ecchymosis over R arm.  2 x 2 cm contusion of R abdominal cavity.   Abdomen: NABS, Soft/NT/ND, no HSM

## 2014-04-21 NOTE — Assessment & Plan Note (Addendum)
Multifactorial.  Able to ambulate with cane > 50 steps without evidence of falling - Peripheral neuropathy and ortho stasis have big component of this.  On Tramadol, Seroquel, ativan, bisoprolol, lisinopril, and demadex which all could contribute to this.  Will stop Seroquel today and f/u with PCP in 5-7 days.  At which point consideration of his fall risk vs benefit from coumadin (unsure why he is on this).  HAS BLED score of 3 with risk factors.  Will stop ASA today and determine if coumadin can be stopped per Dr. Mingo Amber.   - Does state he hit his R elbow on chair.  Evidence of ecchymosis.  Will obtain R elbow X-ray, CBC, and INR.

## 2014-04-21 NOTE — Patient Instructions (Signed)
Please stop your seroquel and aspirin.  Please see Dr. Mingo Amber next week.  Thanks, Dr. Awanda Mink

## 2014-04-24 ENCOUNTER — Ambulatory Visit: Payer: 59

## 2014-04-24 DIAGNOSIS — E1140 Type 2 diabetes mellitus with diabetic neuropathy, unspecified: Secondary | ICD-10-CM | POA: Diagnosis not present

## 2014-04-24 DIAGNOSIS — I2510 Atherosclerotic heart disease of native coronary artery without angina pectoris: Secondary | ICD-10-CM | POA: Diagnosis not present

## 2014-04-24 DIAGNOSIS — E1165 Type 2 diabetes mellitus with hyperglycemia: Secondary | ICD-10-CM | POA: Diagnosis not present

## 2014-04-24 DIAGNOSIS — I5022 Chronic systolic (congestive) heart failure: Secondary | ICD-10-CM | POA: Diagnosis not present

## 2014-04-24 DIAGNOSIS — G8929 Other chronic pain: Secondary | ICD-10-CM | POA: Diagnosis not present

## 2014-04-24 DIAGNOSIS — F411 Generalized anxiety disorder: Secondary | ICD-10-CM | POA: Diagnosis not present

## 2014-04-24 DIAGNOSIS — F329 Major depressive disorder, single episode, unspecified: Secondary | ICD-10-CM | POA: Diagnosis not present

## 2014-04-24 DIAGNOSIS — M6281 Muscle weakness (generalized): Secondary | ICD-10-CM | POA: Diagnosis not present

## 2014-04-24 DIAGNOSIS — R2689 Other abnormalities of gait and mobility: Secondary | ICD-10-CM | POA: Diagnosis not present

## 2014-04-24 DIAGNOSIS — J449 Chronic obstructive pulmonary disease, unspecified: Secondary | ICD-10-CM | POA: Diagnosis not present

## 2014-04-25 ENCOUNTER — Inpatient Hospital Stay: Payer: Medicare Other | Admitting: Family Medicine

## 2014-04-25 ENCOUNTER — Encounter: Payer: Self-pay | Admitting: Family Medicine

## 2014-04-25 ENCOUNTER — Ambulatory Visit: Payer: Medicare Other

## 2014-04-25 NOTE — Progress Notes (Signed)
Received communication fax from home health nurse.  [name] has fallen again at home.  He is NOT taking ASA nor Seroquel per the communication as directed.  I have sent in a prescription for him for a rolling walker to help with ambulation at home.  Have given paper script to [name].  He is following up with me next week.

## 2014-04-25 NOTE — Progress Notes (Signed)
Spoke with patient's wife and informed her that I am setting the rx up front to take to [facility] I have written their phone number on the front.

## 2014-04-26 ENCOUNTER — Other Ambulatory Visit: Payer: Self-pay | Admitting: Family Medicine

## 2014-04-26 DIAGNOSIS — I5022 Chronic systolic (congestive) heart failure: Secondary | ICD-10-CM | POA: Diagnosis not present

## 2014-04-26 DIAGNOSIS — E1140 Type 2 diabetes mellitus with diabetic neuropathy, unspecified: Secondary | ICD-10-CM | POA: Diagnosis not present

## 2014-04-26 DIAGNOSIS — R2689 Other abnormalities of gait and mobility: Secondary | ICD-10-CM | POA: Diagnosis not present

## 2014-04-26 DIAGNOSIS — M6281 Muscle weakness (generalized): Secondary | ICD-10-CM | POA: Diagnosis not present

## 2014-04-26 DIAGNOSIS — I2510 Atherosclerotic heart disease of native coronary artery without angina pectoris: Secondary | ICD-10-CM | POA: Diagnosis not present

## 2014-04-26 DIAGNOSIS — G8929 Other chronic pain: Secondary | ICD-10-CM | POA: Diagnosis not present

## 2014-04-26 DIAGNOSIS — E1165 Type 2 diabetes mellitus with hyperglycemia: Secondary | ICD-10-CM | POA: Diagnosis not present

## 2014-04-26 DIAGNOSIS — J449 Chronic obstructive pulmonary disease, unspecified: Secondary | ICD-10-CM | POA: Diagnosis not present

## 2014-04-26 DIAGNOSIS — F329 Major depressive disorder, single episode, unspecified: Secondary | ICD-10-CM | POA: Diagnosis not present

## 2014-04-26 DIAGNOSIS — F411 Generalized anxiety disorder: Secondary | ICD-10-CM | POA: Diagnosis not present

## 2014-04-26 MED ORDER — MECLIZINE HCL 25 MG PO TABS: 25.0000 mg | ORAL_TABLET | Freq: Three times a day (TID) | ORAL | Status: DC | PRN

## 2014-04-28 ENCOUNTER — Telehealth: Payer: Self-pay | Admitting: Family Medicine

## 2014-04-28 DIAGNOSIS — R2689 Other abnormalities of gait and mobility: Secondary | ICD-10-CM | POA: Diagnosis not present

## 2014-04-28 DIAGNOSIS — M6281 Muscle weakness (generalized): Secondary | ICD-10-CM | POA: Diagnosis not present

## 2014-04-28 DIAGNOSIS — I5022 Chronic systolic (congestive) heart failure: Secondary | ICD-10-CM | POA: Diagnosis not present

## 2014-04-28 DIAGNOSIS — E1165 Type 2 diabetes mellitus with hyperglycemia: Secondary | ICD-10-CM | POA: Diagnosis not present

## 2014-04-28 DIAGNOSIS — F411 Generalized anxiety disorder: Secondary | ICD-10-CM | POA: Diagnosis not present

## 2014-04-28 DIAGNOSIS — E1140 Type 2 diabetes mellitus with diabetic neuropathy, unspecified: Secondary | ICD-10-CM | POA: Diagnosis not present

## 2014-04-28 DIAGNOSIS — G8929 Other chronic pain: Secondary | ICD-10-CM | POA: Diagnosis not present

## 2014-04-28 DIAGNOSIS — J449 Chronic obstructive pulmonary disease, unspecified: Secondary | ICD-10-CM | POA: Diagnosis not present

## 2014-04-28 DIAGNOSIS — F329 Major depressive disorder, single episode, unspecified: Secondary | ICD-10-CM | POA: Diagnosis not present

## 2014-04-28 DIAGNOSIS — I2510 Atherosclerotic heart disease of native coronary artery without angina pectoris: Secondary | ICD-10-CM | POA: Diagnosis not present

## 2014-04-28 NOTE — Telephone Encounter (Signed)
Home Health nurse called because she is concerned about the patients balance. He is falling and not doing well she was hoping that when the doctor seen him that he would recommend that the patient would see a neurologist. Blima Rich

## 2014-05-01 ENCOUNTER — Telehealth: Payer: Self-pay | Admitting: *Deleted

## 2014-05-01 DIAGNOSIS — E1140 Type 2 diabetes mellitus with diabetic neuropathy, unspecified: Secondary | ICD-10-CM | POA: Diagnosis not present

## 2014-05-01 DIAGNOSIS — R2689 Other abnormalities of gait and mobility: Secondary | ICD-10-CM | POA: Diagnosis not present

## 2014-05-01 DIAGNOSIS — J449 Chronic obstructive pulmonary disease, unspecified: Secondary | ICD-10-CM | POA: Diagnosis not present

## 2014-05-01 DIAGNOSIS — I5022 Chronic systolic (congestive) heart failure: Secondary | ICD-10-CM | POA: Diagnosis not present

## 2014-05-01 DIAGNOSIS — F411 Generalized anxiety disorder: Secondary | ICD-10-CM | POA: Diagnosis not present

## 2014-05-01 DIAGNOSIS — M6281 Muscle weakness (generalized): Secondary | ICD-10-CM | POA: Diagnosis not present

## 2014-05-01 DIAGNOSIS — I2510 Atherosclerotic heart disease of native coronary artery without angina pectoris: Secondary | ICD-10-CM | POA: Diagnosis not present

## 2014-05-01 DIAGNOSIS — E1165 Type 2 diabetes mellitus with hyperglycemia: Secondary | ICD-10-CM | POA: Diagnosis not present

## 2014-05-01 DIAGNOSIS — G8929 Other chronic pain: Secondary | ICD-10-CM | POA: Diagnosis not present

## 2014-05-01 DIAGNOSIS — F329 Major depressive disorder, single episode, unspecified: Secondary | ICD-10-CM | POA: Diagnosis not present

## 2014-05-01 NOTE — Telephone Encounter (Signed)
Received a call from Lauretta Chester, RN with [facility] stating pt has history of falls.  Pt's aid stated that pt has been taking his Ativan BID and not once at bedtime as prescribed.  Will forward to PCP.  Call with concerns/questions [phone]. Derl Barrow, RN

## 2014-05-02 ENCOUNTER — Other Ambulatory Visit: Payer: Self-pay | Admitting: Family Medicine

## 2014-05-03 ENCOUNTER — Telehealth: Payer: Self-pay | Admitting: *Deleted

## 2014-05-03 DIAGNOSIS — M6281 Muscle weakness (generalized): Secondary | ICD-10-CM | POA: Diagnosis not present

## 2014-05-03 DIAGNOSIS — G8929 Other chronic pain: Secondary | ICD-10-CM | POA: Diagnosis not present

## 2014-05-03 DIAGNOSIS — E1165 Type 2 diabetes mellitus with hyperglycemia: Secondary | ICD-10-CM | POA: Diagnosis not present

## 2014-05-03 DIAGNOSIS — J449 Chronic obstructive pulmonary disease, unspecified: Secondary | ICD-10-CM | POA: Diagnosis not present

## 2014-05-03 DIAGNOSIS — I5022 Chronic systolic (congestive) heart failure: Secondary | ICD-10-CM | POA: Diagnosis not present

## 2014-05-03 DIAGNOSIS — R2689 Other abnormalities of gait and mobility: Secondary | ICD-10-CM | POA: Diagnosis not present

## 2014-05-03 DIAGNOSIS — F329 Major depressive disorder, single episode, unspecified: Secondary | ICD-10-CM | POA: Diagnosis not present

## 2014-05-03 DIAGNOSIS — E1140 Type 2 diabetes mellitus with diabetic neuropathy, unspecified: Secondary | ICD-10-CM | POA: Diagnosis not present

## 2014-05-03 DIAGNOSIS — I2510 Atherosclerotic heart disease of native coronary artery without angina pectoris: Secondary | ICD-10-CM | POA: Diagnosis not present

## 2014-05-03 DIAGNOSIS — F411 Generalized anxiety disorder: Secondary | ICD-10-CM | POA: Diagnosis not present

## 2014-05-03 NOTE — Telephone Encounter (Signed)
Received call from Olean Ree @ [facility].  She is concerned because pt has been out of her effexor 37.5mg  and prednisone 50mg  for a few weeks now.  Also he is taking his ativan BID (1 am and 1 pm) so he is running out faster.    She wants to know if MD still wants him on the other meds and if he will refill the ativan.  Please give her a call back and she will let the pt know. Fleeger, Salome Spotted

## 2014-05-04 NOTE — Telephone Encounter (Signed)
See other note, will discuss ativan use tomorrow.

## 2014-05-04 NOTE — Telephone Encounter (Signed)
Olean Ree calling from [facility], read her Dr. Cindra Presume message verbatim, she will relay the message to the patient.

## 2014-05-04 NOTE — Telephone Encounter (Signed)
LMOVM for carol to return call. Fleeger, Salome Spotted

## 2014-05-04 NOTE — Telephone Encounter (Signed)
I am seeing Ed tomorrow in clinic.  Will restart the effexor.  Do not think he needs the prednisone.  May also not need the ativan due to his falls.  Will discuss more tomorrow.  Can call Olean Ree if you would.

## 2014-05-04 NOTE — Telephone Encounter (Signed)
Will discuss tomorrow.  Please see other OV notes.

## 2014-05-05 ENCOUNTER — Ambulatory Visit (INDEPENDENT_AMBULATORY_CARE_PROVIDER_SITE_OTHER): Payer: Medicare Other | Admitting: *Deleted

## 2014-05-05 ENCOUNTER — Ambulatory Visit (INDEPENDENT_AMBULATORY_CARE_PROVIDER_SITE_OTHER): Payer: Medicare Other | Admitting: Family Medicine

## 2014-05-05 ENCOUNTER — Encounter: Payer: Self-pay | Admitting: Family Medicine

## 2014-05-05 VITALS — BP 100/59 | HR 65 | Temp 97.9°F | Ht 68.0 in | Wt 255.6 lb

## 2014-05-05 DIAGNOSIS — Z7901 Long term (current) use of anticoagulants: Secondary | ICD-10-CM

## 2014-05-05 DIAGNOSIS — R2681 Unsteadiness on feet: Secondary | ICD-10-CM | POA: Diagnosis not present

## 2014-05-05 DIAGNOSIS — M7500 Adhesive capsulitis of unspecified shoulder: Secondary | ICD-10-CM | POA: Diagnosis not present

## 2014-05-05 LAB — POCT INR: INR: 2.3

## 2014-05-05 MED ORDER — LIDOCAINE 5 % EX PTCH: 1.0000 | MEDICATED_PATCH | CUTANEOUS | Status: DC

## 2014-05-05 MED ORDER — LISINOPRIL 5 MG PO TABS: 5.0000 mg | ORAL_TABLET | Freq: Every day | ORAL | Status: DC

## 2014-05-05 MED ORDER — LORAZEPAM 0.5 MG PO TABS: 0.2500 mg | ORAL_TABLET | Freq: Every evening | ORAL | Status: DC | PRN

## 2014-05-05 MED ORDER — ACETAMINOPHEN-CODEINE #4 300-60 MG PO TABS: 1.0000 | ORAL_TABLET | Freq: Three times a day (TID) | ORAL | Status: DC | PRN

## 2014-05-05 MED ORDER — VENLAFAXINE HCL ER 37.5 MG PO CP24: ORAL_CAPSULE | ORAL | Status: DC

## 2014-05-05 NOTE — Patient Instructions (Signed)
Cut the Lisinopril in half to 5 mg.    I'm glad your walking and balance is better.    Refill for Ativan and Tylenol 4 today.  I'll send in the Lidocaine patch today.    See me in 1 month to make sure that your balance is good.

## 2014-05-05 NOTE — Assessment & Plan Note (Signed)
Much improved with addition of meclizine. Will continue to hold Seroquel, though this didn't improve his unsteady gait.  But don't think it was helping much either.   With history of orthostasis, decreasing his Lisinopril even further down to 5 mg.  Has known (bad) CAD -- worry about totally stopping beta blocker and ACE-I since he's so much improved. Reiterated Ativan is only for use at night.   Fu with me in 1 month to ensure continued improvement.  Return ASAP if recurs

## 2014-05-05 NOTE — Progress Notes (Signed)
Subjective:    [name] is a 74 y.o. male who presents to [facility] today:  1.  Dizziness:  VASTLY improved.  No furhter falls for about 2 weeks.  Stopped with addition of Meclizine.  Sounds like it was vertigo.  Still occasionally gets lightheaded with sudden standing but this is nothing new for him.    ROS as above per HPI, otherwise neg.    The following portions of the patient's history were reviewed and updated as appropriate: allergies, current medications, past medical history, family and social history, and problem list. Patient is a nonsmoker.    PMH reviewed.  Past Medical History  Diagnosis Date  . Depression   . Diabetes mellitus   . COPD (chronic obstructive pulmonary disease)   . Adhesive capsulitis   . MRSA bacteremia     2011 - possible endocarditis, received 6 weeks IV treatment  . Hyperlipidemia   . Anxiety   . GERD (gastroesophageal reflux disease)   . CAD (coronary artery disease)   . 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 CVA (cerebral infarction) Questionable history  . Diverticulosis   . Osteomyelitis 2012    Sternoclavicular joint   . Chronic back pain   . Angina   . Myocardial infarction   . CHF (congestive heart failure)   . Shortness of breath   . Sleep apnea   . Chronic kidney disease     hx of BPH  . Neuromuscular disorder     HX of diabetic periferal neuropathy  . Hypertension   . Peripheral vascular disease   . On home oxygen therapy     "1.5L prn" (04/12/2014)   Past Surgical History  Procedure Laterality Date  . Septic arthritis      Removal of infected CABG wire by Dr Arlyce Dice - 2011  . Coronary artery bypass graft  1992  . Fracture surgery  1980s    Hip  . Spine surgery  2002    Cervical fusion vertebroplasty C3-4-5  . Coronary stent placement  1999, 2002  . Doppler echocardiography  Sept 2011    EF 50-55% with some impaired diastolic relaxation  . Colonoscopy  2006     Multiple polyps removed, repeat  in 5 years  . Right iliopopliteal bypass - [facility]  . Femoral-popliteal bypass graft  2008    Left  . Cardiac catheterization  October 18, 2010    Known obstructive disease, no further blockages  . Cardiac catheterization  03/17/2005    EF 35-40%  . US echocardiography  03/04/2006    EF 50-55%  . Amputation  03/16/2011    Procedure: AMPUTATION DIGIT;  Surgeon: Angelia Mould, MD;  Location: [facility] OR;  Service: Vascular;  Laterality: Left;  Great toe    Medications reviewed. Current Outpatient Prescriptions  Medication Sig Dispense Refill  . Acetaminophen (TYLENOL ARTHRITIS PAIN PO) Take 1-2 tablets by mouth 2 (two) times daily as needed (for arthritis pain).    Marland Kitchen acetaminophen-codeine (TYLENOL #4) 300-60 MG per tablet Take 1 tablet by mouth every 8 (eight) hours as needed for moderate pain. 30 tablet 2  . bisoprolol (ZEBETA) 5 MG tablet Take 0.5 tablets (2.5 mg total) by mouth daily. 90 tablet 3  . Blood Glucose Monitoring Suppl (ONE TOUCH ULTRA 2) W/DEVICE KIT Please supply patient with 1 glucometer device to use 4 times daily to check blood sugars.  Dx code: 250.0 1 each 1  . glucose blood  test strip Use as instructed.  One Step Ultra 2 strips. 100 each 12  . insulin NPH Human (HUMULIN N,NOVOLIN N) 100 UNIT/ML injection Inject 0.35 mLs (35 Units total) into the skin 2 (two) times daily before a meal. 10 mL 11  . Insulin Syringe-Needle U-100 (B-D INS SYRINGE 0.5CC/30GX1/2") 30G X 1/2" 0.5 ML MISC For use with insulin 100 each 0  . lidocaine (LIDODERM) 5 % Place 1 patch onto the skin daily. Remove & Discard patch within 12 hours or as directed by MD 30 patch 0  . lisinopril (PRINIVIL,ZESTRIL) 5 MG tablet Take 1 tablet (5 mg total) by mouth daily. 30 tablet 1  . LORazepam (ATIVAN) 0.5 MG tablet Take 0.5 tablets (0.25 mg total) by mouth at bedtime as needed for anxiety. 20 tablet 0  . meclizine (ANTIVERT) 25 MG tablet Take 1 tablet (25 mg total) by mouth 3 (three) times daily as needed  for dizziness. 30 tablet 0  . NEEDLE, DISP, 30 G (B-D DISP NEEDLE 30GX1") 30G X 1" MISC For using with insulin. 100 each 0  . nitroGLYCERIN (NITROSTAT) 0.4 MG SL tablet Place 1 tablet (0.4 mg total) under the tongue as needed. For chest pains 30 tablet 6  . omeprazole (PRILOSEC) 40 MG capsule TAKE ONE CAPSULE BY MOUTH ONCE DAILY 30 capsule 3  . simvastatin (ZOCOR) 80 MG tablet TAKE ONE TABLET BY MOUTH AT BEDTIME 90 tablet 3  . tamsulosin (FLOMAX) 0.4 MG CAPS capsule Take 1 capsule (0.4 mg total) by mouth daily after supper. 90 capsule 1  . torsemide (DEMADEX) 20 MG tablet TAKE ONE TABLET BY MOUTH EVERY DAY 60 tablet 2  . traMADol (ULTRAM) 50 MG tablet TAKE ONE TO TWO TABLETS BY MOUTH EVERY 8 HOURS AS NEEDED (Patient not taking: Reported on 02/08/2014) 30 tablet 1  . venlafaxine XR (EFFEXOR-XR) 37.5 MG 24 hr capsule TAKE ONE CAPSULE BY MOUTH ONCE DAILY 30 capsule 11  . warfarin (COUMADIN) 5 MG tablet Take 2.5-5 mg by mouth See admin instructions. Take a whole tablet everyday except on Mondays and Fridays take half tab    . zoster vaccine live, PF, (ZOSTAVAX) 44461 UNT/0.65ML injection Inject 19,400 Units into the skin once. 1 each 0   No current facility-administered medications for this visit.     Objective:   Physical Exam BP 100/59 mmHg  Pulse 65  Temp(Src) 97.9 F (36.6 C) (Oral)  Ht _0  (1.727 m)  Wt 255 lb 9.6 oz (115.939 kg)  BMI 38.87 kg/m2 Gen:  Alert, cooperative patient who appears stated age in no acute distress.  Vital signs reviewed. HEENT: EOMI,  MMM Cardiac:  Regular rate and rhythm  Pulm:  Clear to auscultation bilaterally  Exts: +2 edema BL.   Neuro:  Watched him walk.  Ambulated with cane, but ambulating well, back to his baseline.    No results found for this or any previous visit (from the past 72 hour(s)).

## 2014-05-05 NOTE — Assessment & Plan Note (Signed)
Out of his Tylenol 4.  Refilled today.

## 2014-05-09 ENCOUNTER — Other Ambulatory Visit: Payer: Self-pay | Admitting: Family Medicine

## 2014-05-09 ENCOUNTER — Emergency Department (HOSPITAL_COMMUNITY): Payer: Medicare Other

## 2014-05-09 ENCOUNTER — Inpatient Hospital Stay (HOSPITAL_COMMUNITY)
Admission: EM | Admit: 2014-05-09 | Discharge: 2014-05-11 | DRG: 683 | Disposition: A | Discharge status: Skilled Nursing Facility | Payer: Medicare Other | Attending: Family Medicine | Admitting: Family Medicine

## 2014-05-09 ENCOUNTER — Encounter (HOSPITAL_COMMUNITY): Payer: Self-pay | Admitting: Adult Health

## 2014-05-09 DIAGNOSIS — A084 Viral intestinal infection, unspecified: Secondary | ICD-10-CM | POA: Diagnosis not present

## 2014-05-09 DIAGNOSIS — N179 Acute kidney failure, unspecified: Secondary | ICD-10-CM | POA: Diagnosis not present

## 2014-05-09 DIAGNOSIS — Z955 Presence of coronary angioplasty implant and graft: Secondary | ICD-10-CM

## 2014-05-09 DIAGNOSIS — Z794 Long term (current) use of insulin: Secondary | ICD-10-CM | POA: Diagnosis not present

## 2014-05-09 DIAGNOSIS — G4733 Obstructive sleep apnea (adult) (pediatric): Secondary | ICD-10-CM | POA: Diagnosis not present

## 2014-05-09 DIAGNOSIS — R197 Diarrhea, unspecified: Secondary | ICD-10-CM | POA: Diagnosis present

## 2014-05-09 DIAGNOSIS — I951 Orthostatic hypotension: Secondary | ICD-10-CM | POA: Diagnosis not present

## 2014-05-09 DIAGNOSIS — Z79899 Other long term (current) drug therapy: Secondary | ICD-10-CM

## 2014-05-09 DIAGNOSIS — R1312 Dysphagia, oropharyngeal phase: Secondary | ICD-10-CM | POA: Diagnosis not present

## 2014-05-09 DIAGNOSIS — M479 Spondylosis, unspecified: Secondary | ICD-10-CM | POA: Diagnosis not present

## 2014-05-09 DIAGNOSIS — Z8673 Personal history of transient ischemic attack (TIA), and cerebral infarction without residual deficits: Secondary | ICD-10-CM | POA: Diagnosis not present

## 2014-05-09 DIAGNOSIS — M19011 Primary osteoarthritis, right shoulder: Secondary | ICD-10-CM | POA: Diagnosis not present

## 2014-05-09 DIAGNOSIS — Z7901 Long term (current) use of anticoagulants: Secondary | ICD-10-CM

## 2014-05-09 DIAGNOSIS — Z951 Presence of aortocoronary bypass graft: Secondary | ICD-10-CM

## 2014-05-09 DIAGNOSIS — R2681 Unsteadiness on feet: Secondary | ICD-10-CM | POA: Diagnosis not present

## 2014-05-09 DIAGNOSIS — E1142 Type 2 diabetes mellitus with diabetic polyneuropathy: Secondary | ICD-10-CM | POA: Diagnosis not present

## 2014-05-09 DIAGNOSIS — N189 Chronic kidney disease, unspecified: Secondary | ICD-10-CM | POA: Diagnosis present

## 2014-05-09 DIAGNOSIS — I129 Hypertensive chronic kidney disease with stage 1 through stage 4 chronic kidney disease, or unspecified chronic kidney disease: Secondary | ICD-10-CM | POA: Diagnosis present

## 2014-05-09 DIAGNOSIS — R2689 Other abnormalities of gait and mobility: Secondary | ICD-10-CM | POA: Diagnosis not present

## 2014-05-09 DIAGNOSIS — N400 Enlarged prostate without lower urinary tract symptoms: Secondary | ICD-10-CM | POA: Diagnosis present

## 2014-05-09 DIAGNOSIS — I2510 Atherosclerotic heart disease of native coronary artery without angina pectoris: Secondary | ICD-10-CM | POA: Diagnosis not present

## 2014-05-09 DIAGNOSIS — F419 Anxiety disorder, unspecified: Secondary | ICD-10-CM | POA: Diagnosis present

## 2014-05-09 DIAGNOSIS — R488 Other symbolic dysfunctions: Secondary | ICD-10-CM | POA: Diagnosis not present

## 2014-05-09 DIAGNOSIS — F329 Major depressive disorder, single episode, unspecified: Secondary | ICD-10-CM | POA: Diagnosis present

## 2014-05-09 DIAGNOSIS — K4090 Unilateral inguinal hernia, without obstruction or gangrene, not specified as recurrent: Secondary | ICD-10-CM | POA: Diagnosis not present

## 2014-05-09 DIAGNOSIS — Z86711 Personal history of pulmonary embolism: Secondary | ICD-10-CM

## 2014-05-09 DIAGNOSIS — I959 Hypotension, unspecified: Secondary | ICD-10-CM | POA: Diagnosis present

## 2014-05-09 DIAGNOSIS — Z8614 Personal history of Methicillin resistant Staphylococcus aureus infection: Secondary | ICD-10-CM | POA: Diagnosis not present

## 2014-05-09 DIAGNOSIS — R42 Dizziness and giddiness: Secondary | ICD-10-CM | POA: Diagnosis not present

## 2014-05-09 DIAGNOSIS — M6281 Muscle weakness (generalized): Secondary | ICD-10-CM | POA: Diagnosis not present

## 2014-05-09 DIAGNOSIS — E785 Hyperlipidemia, unspecified: Secondary | ICD-10-CM | POA: Diagnosis present

## 2014-05-09 DIAGNOSIS — Z9181 History of falling: Secondary | ICD-10-CM | POA: Diagnosis not present

## 2014-05-09 DIAGNOSIS — E860 Dehydration: Secondary | ICD-10-CM | POA: Diagnosis not present

## 2014-05-09 DIAGNOSIS — G8929 Other chronic pain: Secondary | ICD-10-CM | POA: Diagnosis not present

## 2014-05-09 DIAGNOSIS — E1140 Type 2 diabetes mellitus with diabetic neuropathy, unspecified: Secondary | ICD-10-CM | POA: Diagnosis not present

## 2014-05-09 DIAGNOSIS — Z981 Arthrodesis status: Secondary | ICD-10-CM

## 2014-05-09 DIAGNOSIS — E1165 Type 2 diabetes mellitus with hyperglycemia: Secondary | ICD-10-CM | POA: Diagnosis not present

## 2014-05-09 DIAGNOSIS — Z89412 Acquired absence of left great toe: Secondary | ICD-10-CM | POA: Diagnosis not present

## 2014-05-09 DIAGNOSIS — Z86718 Personal history of other venous thrombosis and embolism: Secondary | ICD-10-CM

## 2014-05-09 DIAGNOSIS — Z87891 Personal history of nicotine dependence: Secondary | ICD-10-CM | POA: Diagnosis not present

## 2014-05-09 DIAGNOSIS — I252 Old myocardial infarction: Secondary | ICD-10-CM

## 2014-05-09 DIAGNOSIS — I5022 Chronic systolic (congestive) heart failure: Secondary | ICD-10-CM | POA: Diagnosis not present

## 2014-05-09 DIAGNOSIS — F411 Generalized anxiety disorder: Secondary | ICD-10-CM | POA: Diagnosis not present

## 2014-05-09 DIAGNOSIS — R531 Weakness: Secondary | ICD-10-CM | POA: Diagnosis not present

## 2014-05-09 DIAGNOSIS — K219 Gastro-esophageal reflux disease without esophagitis: Secondary | ICD-10-CM | POA: Diagnosis not present

## 2014-05-09 DIAGNOSIS — J449 Chronic obstructive pulmonary disease, unspecified: Secondary | ICD-10-CM | POA: Diagnosis not present

## 2014-05-09 DIAGNOSIS — I739 Peripheral vascular disease, unspecified: Secondary | ICD-10-CM | POA: Diagnosis present

## 2014-05-09 DIAGNOSIS — K5730 Diverticulosis of large intestine without perforation or abscess without bleeding: Secondary | ICD-10-CM | POA: Diagnosis not present

## 2014-05-09 LAB — I-STAT CHEM 8, ED
BUN: 34 mg/dL — ABNORMAL HIGH (ref 6–23)
Calcium, Ion: 1.06 mmol/L — ABNORMAL LOW (ref 1.13–1.30)
Chloride: 100 mmol/L (ref 96–112)
Creatinine, Ser: 1.8 mg/dL — ABNORMAL HIGH (ref 0.50–1.35)
GLUCOSE: 139 mg/dL — AB (ref 70–99)
HCT: 43 % (ref 39.0–52.0)
Hemoglobin: 14.6 g/dL (ref 13.0–17.0)
Potassium: 4.7 mmol/L (ref 3.5–5.1)
Sodium: 139 mmol/L (ref 135–145)
TCO2: 26 mmol/L (ref 0–100)

## 2014-05-09 LAB — CBC WITH DIFFERENTIAL/PLATELET
BASOS ABS: 0 10*3/uL (ref 0.0–0.1)
BASOS PCT: 0 % (ref 0–1)
Eosinophils Absolute: 0.1 10*3/uL (ref 0.0–0.7)
Eosinophils Relative: 1 % (ref 0–5)
HCT: 44.7 % (ref 39.0–52.0)
Hemoglobin: 15 g/dL (ref 13.0–17.0)
Lymphocytes Relative: 14 % (ref 12–46)
Lymphs Abs: 1.8 10*3/uL (ref 0.7–4.0)
MCH: 27 pg (ref 26.0–34.0)
MCHC: 33.6 g/dL (ref 30.0–36.0)
MCV: 80.4 fL (ref 78.0–100.0)
Monocytes Absolute: 1.6 10*3/uL — ABNORMAL HIGH (ref 0.1–1.0)
Monocytes Relative: 12 % (ref 3–12)
NEUTROS PCT: 73 % (ref 43–77)
Neutro Abs: 9.5 10*3/uL — ABNORMAL HIGH (ref 1.7–7.7)
PLATELETS: 207 10*3/uL (ref 150–400)
RBC: 5.56 MIL/uL (ref 4.22–5.81)
RDW: 16.9 % — ABNORMAL HIGH (ref 11.5–15.5)
WBC: 13 10*3/uL — ABNORMAL HIGH (ref 4.0–10.5)

## 2014-05-09 LAB — URINALYSIS, ROUTINE W REFLEX MICROSCOPIC
Bilirubin Urine: NEGATIVE
Glucose, UA: NEGATIVE mg/dL
HGB URINE DIPSTICK: NEGATIVE
Ketones, ur: NEGATIVE mg/dL
Leukocytes, UA: NEGATIVE
NITRITE: NEGATIVE
Protein, ur: 30 mg/dL — AB
SPECIFIC GRAVITY, URINE: 1.018 (ref 1.005–1.030)
UROBILINOGEN UA: 0.2 mg/dL (ref 0.0–1.0)
pH: 6 (ref 5.0–8.0)

## 2014-05-09 LAB — I-STAT TROPONIN, ED: TROPONIN I, POC: 0.02 ng/mL (ref 0.00–0.08)

## 2014-05-09 LAB — COMPREHENSIVE METABOLIC PANEL
ALT: 20 U/L (ref 0–53)
AST: 22 U/L (ref 0–37)
Albumin: 3.9 g/dL (ref 3.5–5.2)
Alkaline Phosphatase: 70 U/L (ref 39–117)
Anion gap: 11 (ref 5–15)
BUN: 24 mg/dL — ABNORMAL HIGH (ref 6–23)
CO2: 23 mmol/L (ref 19–32)
CREATININE: 1.93 mg/dL — AB (ref 0.50–1.35)
Calcium: 8.8 mg/dL (ref 8.4–10.5)
Chloride: 102 mmol/L (ref 96–112)
GFR, EST AFRICAN AMERICAN: 37 mL/min — AB (ref >90)
GFR, EST NON AFRICAN AMERICAN: 32 mL/min — AB (ref >90)
GLUCOSE: 148 mg/dL — AB (ref 70–99)
Potassium: 4.1 mmol/L (ref 3.5–5.1)
Sodium: 136 mmol/L (ref 135–145)
Total Bilirubin: 0.6 mg/dL (ref 0.3–1.2)
Total Protein: 6.7 g/dL (ref 6.0–8.3)

## 2014-05-09 LAB — BRAIN NATRIURETIC PEPTIDE: B Natriuretic Peptide: 102.4 pg/mL — ABNORMAL HIGH (ref 0.0–100.0)

## 2014-05-09 LAB — URINE MICROSCOPIC-ADD ON

## 2014-05-09 LAB — TROPONIN I: Troponin I: <0.03 ng/mL (ref <0.031)

## 2014-05-09 LAB — PROTIME-INR
INR: 2.4 — ABNORMAL HIGH (ref 0.00–1.49)
Prothrombin Time: 26.4 seconds — ABNORMAL HIGH (ref 11.6–15.2)

## 2014-05-09 LAB — I-STAT CG4 LACTIC ACID, ED
LACTIC ACID, VENOUS: 2.34 mmol/L — AB (ref 0.5–2.0)
Lactic Acid, Venous: 0.91 mmol/L (ref 0.5–2.0)

## 2014-05-09 MED ORDER — SODIUM CHLORIDE 0.9 % IV BOLUS (SEPSIS)
1000.0000 mL | Freq: Once | INTRAVENOUS | Status: AC
  Administered 2014-05-09: 1000 mL via INTRAVENOUS

## 2014-05-09 MED ORDER — SODIUM CHLORIDE 0.9 % IV BOLUS (SEPSIS)
500.0000 mL | Freq: Once | INTRAVENOUS | Status: AC
  Administered 2014-05-09: 500 mL via INTRAVENOUS

## 2014-05-09 MED ORDER — SODIUM CHLORIDE 0.9 % IV BOLUS (SEPSIS): 500.0000 mL | INTRAVENOUS | Status: AC

## 2014-05-09 MED ORDER — SODIUM CHLORIDE 0.9 % IV BOLUS (SEPSIS): 1000.0000 mL | INTRAVENOUS | Status: AC

## 2014-05-09 NOTE — Telephone Encounter (Signed)
[facility] Nurse called pt's blood pressure was 70/40 manually and complaining of dizziness for a couple of days.  Pt has been out of several medications for about a day.  Pt has taken all of his meclizine for dizziness.  Ernestina Columbia to have pt go to ED for evaluation for blood pressure and dizziness, no appt at [facility].  Will forward to PCP.  Derl Barrow, RN

## 2014-05-09 NOTE — Progress Notes (Signed)
Paged MD on call about order clarification. Will await call back.   [name] M

## 2014-05-09 NOTE — ED Notes (Signed)
Cg-4 result reported to Dr. Justin Mend

## 2014-05-09 NOTE — ED Provider Notes (Signed)
CSN: [id number]     Arrival date & time 05/09/14  1537 History   First MD Initiated Contact with Patient 05/09/14 1631     Chief Complaint  Patient presents with  . Hypotension     (Consider location/radiation/quality/duration/timing/severity/associated sxs/prior Treatment) HPI Comments: 74 yo M hx of COPD, DMII, CAD, CHF, PE on Coumadin, CVA, CKD, hx of orthostasis, presents with CC of hypotension.  Pt states symptoms started 3 days ago.  C/o two days of nonbloody diarrhea, followed by lightheadedness with standing and worsening to lightheadedness at rest, and some associated SOB, and abdominal pain.  Denies fever, chills, diaphoresis, CP, SOB, cough, nausea, vomiting, constipation, dysuria, hematuria, rash, myalgias, or any other symptoms.  Pt seen in [place] office recently for orthostasis, and medications adjusted for this.  Pt states he continues to take home diuretics during his illness.  EMS called out today, and found pt to be hypotensive to 60's, pale.  Pt brought to ED for further evaluation.   The history is provided by the patient and the EMS personnel. No language interpreter was used.    Past Medical History  Diagnosis Date  . Depression   . Diabetes mellitus   . COPD (chronic obstructive pulmonary disease)   . Adhesive capsulitis   . MRSA bacteremia     2011 - possible endocarditis, received 6 weeks IV treatment  . Hyperlipidemia   . Anxiety   . GERD (gastroesophageal reflux disease)   . CAD (coronary artery disease)   . 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 CVA (cerebral infarction) Questionable history  . Diverticulosis   . Osteomyelitis 2012    Sternoclavicular joint   . Chronic back pain   . Angina   . Myocardial infarction   . CHF (congestive heart failure)   . Shortness of breath   . Sleep apnea   . Chronic kidney disease     hx of BPH  . Neuromuscular disorder     HX of diabetic periferal neuropathy  . Hypertension   . Peripheral  vascular disease   . On home oxygen therapy     "1.5L prn" (04/12/2014)   Past Surgical History  Procedure Laterality Date  . Septic arthritis      Removal of infected CABG wire by Dr Arlyce Dice - 2011  . Coronary artery bypass graft  1992  . Fracture surgery  1980s    Hip  . Spine surgery  2002    Cervical fusion vertebroplasty C3-4-5  . Coronary stent placement  1999, 2002  . Doppler echocardiography  Sept 2011    EF 50-55% with some impaired diastolic relaxation  . Colonoscopy  2006     Multiple polyps removed, repeat in 5 years  . Right iliopopliteal bypass - [place]  . Femoral-popliteal bypass graft  2008    Left  . Cardiac catheterization  October 18, 2010    Known obstructive disease, no further blockages  . Cardiac catheterization  03/17/2005    EF 35-40%  . US echocardiography  03/04/2006    EF 50-55%  . Amputation  03/16/2011    Procedure: AMPUTATION DIGIT;  Surgeon: Angelia Mould, MD;  Location: [facility] OR;  Service: Vascular;  Laterality: Left;  Great toe   Family History  Problem Relation Age of Onset  . Heart disease Father   . Heart disease Mother    History  Substance Use Topics  . Smoking status: Former Smoker  Quit date: 06/19/2000  . Smokeless tobacco: Never Used  . Alcohol Use: No    Review of Systems  Constitutional: Negative for fever and chills.  Respiratory: Positive for shortness of breath. Negative for cough.   Cardiovascular: Negative for chest pain.  Gastrointestinal: Positive for abdominal pain and diarrhea. Negative for nausea, vomiting and constipation.  Genitourinary: Negative for dysuria.  Musculoskeletal: Negative for myalgias.  Skin: Negative for rash.  Neurological: Negative for dizziness, weakness, light-headedness and headaches.  Hematological: Negative for adenopathy. Does not bruise/bleed easily.  All other systems reviewed and are negative.     Allergies  Vancomycin and Diazepam  Home Medications   Prior to  Admission medications   Medication Sig Start Date End Date Taking? Authorizing Provider  Acetaminophen (TYLENOL ARTHRITIS PAIN PO) Take 1-2 tablets by mouth 2 (two) times daily as needed (for arthritis pain).    Historical Provider, MD  acetaminophen-codeine (TYLENOL #4) 300-60 MG per tablet Take 1 tablet by mouth every 8 (eight) hours as needed for moderate pain. 05/05/14   Alveda Reasons, MD  bisoprolol (ZEBETA) 5 MG tablet Take 0.5 tablets (2.5 mg total) by mouth daily. 03/29/14   Alveda Reasons, MD  Blood Glucose Monitoring Suppl (ONE TOUCH ULTRA 2) W/DEVICE KIT Please supply patient with 1 glucometer device to use 4 times daily to check blood sugars.  Dx code: 250.0 10/19/13   Alveda Reasons, MD  glucose blood test strip Use as instructed.  One Step Ultra 2 strips. 02/20/14   Alveda Reasons, MD  insulin NPH Human (HUMULIN N,NOVOLIN N) 100 UNIT/ML injection Inject 0.35 mLs (35 Units total) into the skin 2 (two) times daily before a meal. 02/23/14   Alveda Reasons, MD  Insulin Syringe-Needle U-100 (B-D INS SYRINGE 0.5CC/30GX1/2") 30G X 1/2" 0.5 ML MISC For use with insulin 01/20/14   Alveda Reasons, MD  lidocaine (LIDODERM) 5 % Place 1 patch onto the skin daily. Remove & Discard patch within 12 hours or as directed by MD 05/05/14   Alveda Reasons, MD  lisinopril (PRINIVIL,ZESTRIL) 5 MG tablet Take 1 tablet (5 mg total) by mouth daily. 05/05/14   Alveda Reasons, MD  LORazepam (ATIVAN) 0.5 MG tablet Take 0.5 tablets (0.25 mg total) by mouth at bedtime as needed for anxiety. 05/05/14   Alveda Reasons, MD  meclizine (ANTIVERT) 25 MG tablet Take 1 tablet (25 mg total) by mouth 3 (three) times daily as needed for dizziness. 04/26/14   Alveda Reasons, MD  NEEDLE, DISP, 30 G (B-D DISP NEEDLE 30GX1") 30G X 1" MISC For using with insulin. 01/20/14   Alveda Reasons, MD  nitroGLYCERIN (NITROSTAT) 0.4 MG SL tablet Place 1 tablet (0.4 mg total) under the tongue as needed. For chest pains 10/19/13    Alveda Reasons, MD  omeprazole (PRILOSEC) 40 MG capsule TAKE ONE CAPSULE BY MOUTH ONCE DAILY 11/29/13   Alveda Reasons, MD  simvastatin (ZOCOR) 80 MG tablet TAKE ONE TABLET BY MOUTH AT BEDTIME 10/19/13   Alveda Reasons, MD  tamsulosin (FLOMAX) 0.4 MG CAPS capsule Take 1 capsule (0.4 mg total) by mouth daily after supper. 10/19/13   Alveda Reasons, MD  torsemide (DEMADEX) 20 MG tablet TAKE ONE TABLET BY MOUTH EVERY DAY 01/20/14   Alveda Reasons, MD  traMADol (ULTRAM) 50 MG tablet TAKE ONE TO TWO TABLETS BY MOUTH EVERY 8 HOURS AS NEEDED Patient not taking: Reported on 02/08/2014    Alveda Reasons,  MD  venlafaxine XR (EFFEXOR-XR) 37.5 MG 24 hr capsule TAKE ONE CAPSULE BY MOUTH ONCE DAILY 05/05/14   Alveda Reasons, MD  warfarin (COUMADIN) 5 MG tablet Take 2.5-5 mg by mouth See admin instructions. Take a whole tablet everyday except on Mondays and Fridays take half tab    Historical Provider, MD  zoster vaccine live, PF, (ZOSTAVAX) 43329 UNT/0.65ML injection Inject 19,400 Units into the skin once. 02/17/12   Alveda Reasons, MD   BP 64/36 mmHg  Pulse 109  Temp(Src) 97.5 F (36.4 C) (Oral)  Resp 20  SpO2 90% Physical Exam  Constitutional: He is oriented to person, place, and time. He appears well-developed and well-nourished.  HENT:  Head: Normocephalic and atraumatic.  Right Ear: External ear normal.  Left Ear: External ear normal.  Mouth/Throat: Oropharynx is clear and moist.  Eyes: Conjunctivae and EOM are normal. Pupils are equal, round, and reactive to light.  Neck: Normal range of motion. Neck supple.  Cardiovascular: Normal rate, regular rhythm, normal heart sounds and intact distal pulses.   2+ radial pulses and pedal pulses bilaterally.   Pulmonary/Chest: Effort normal and breath sounds normal. No respiratory distress. He has no wheezes. He has no rales. He exhibits no tenderness.  Abdominal: Soft. Bowel sounds are normal. He exhibits no distension and no mass. There is  tenderness. There is no rebound and no guarding.  Mild diffuse TTP, soft, no guarding, no rebound.   Musculoskeletal: Normal range of motion.  Neurological: He is alert and oriented to person, place, and time.  Normal mentation, no lethargy.  No focal neuro exam.   Skin: Skin is warm and dry.  Nursing note and vitals reviewed.   ED Course  Procedures (including critical care time) Labs Review Labs Reviewed  COMPREHENSIVE METABOLIC PANEL - Abnormal; Notable for the following:    Glucose, Bld 148 (*)    BUN 24 (*)    Creatinine, Ser 1.93 (*)    GFR calc non Af Amer 32 (*)    GFR calc Af Amer 37 (*)    All other components within normal limits  CBC WITH DIFFERENTIAL/PLATELET - Abnormal; Notable for the following:    WBC 13.0 (*)    RDW 16.9 (*)    Neutro Abs 9.5 (*)    Monocytes Absolute 1.6 (*)    All other components within normal limits  BRAIN NATRIURETIC PEPTIDE - Abnormal; Notable for the following:    B Natriuretic Peptide 102.4 (*)    All other components within normal limits  URINALYSIS, ROUTINE W REFLEX MICROSCOPIC - Abnormal; Notable for the following:    Protein, ur 30 (*)    All other components within normal limits  PROTIME-INR - Abnormal; Notable for the following:    Prothrombin Time 26.4 (*)    INR 2.40 (*)    All other components within normal limits  GLUCOSE, CAPILLARY - Abnormal; Notable for the following:    Glucose-Capillary 224 (*)    All other components within normal limits  BASIC METABOLIC PANEL - Abnormal; Notable for the following:    Glucose, Bld 148 (*)    Calcium 8.3 (*)    GFR calc non Af Amer 62 (*)    GFR calc Af Amer 72 (*)    Anion gap 3 (*)    All other components within normal limits  CBC - Abnormal; Notable for the following:    Hemoglobin 12.6 (*)    RDW 17.0 (*)    Platelets 147 (*)  All other components within normal limits  GLUCOSE, CAPILLARY - Abnormal; Notable for the following:    Glucose-Capillary 143 (*)    All other  components within normal limits  PROTIME-INR - Abnormal; Notable for the following:    Prothrombin Time 26.0 (*)    INR 2.36 (*)    All other components within normal limits  GLUCOSE, CAPILLARY - Abnormal; Notable for the following:    Glucose-Capillary 159 (*)    All other components within normal limits  I-STAT CHEM 8, ED - Abnormal; Notable for the following:    BUN 34 (*)    Creatinine, Ser 1.80 (*)    Glucose, Bld 139 (*)    Calcium, Ion 1.06 (*)    All other components within normal limits  I-STAT CG4 LACTIC ACID, ED - Abnormal; Notable for the following:    Lactic Acid, Venous 2.34 (*)    All other components within normal limits  CULTURE, BLOOD (ROUTINE X 2)  CULTURE, BLOOD (ROUTINE X 2)  CLOSTRIDIUM DIFFICILE BY PCR  TROPONIN I  URINE MICROSCOPIC-ADD ON  TSH  HEMOGLOBIN A1C  I-STAT TROPOININ, ED  I-STAT CG4 LACTIC ACID, ED    Imaging Review Ct Abdomen Pelvis Wo Contrast  05/09/2014   CLINICAL DATA:  Hypotension for 3 days, blood pressure in the 25E systolic, pale, shortness of breath and dizziness, LEFT lower quadrant pain, diarrhea, history diabetes, COPD, coronary artery disease post MI, CHF, stroke  EXAM: CT ABDOMEN AND PELVIS WITHOUT CONTRAST  TECHNIQUE: Multidetector CT imaging of the abdomen and pelvis was performed following the standard protocol without IV contrast. Sagittal and coronal MPR images reconstructed from axial data set. Oral contrast was not administered.  COMPARISON:  04/12/2014  FINDINGS: Dependent atelectasis at both lung bases.  Atherosclerotic calcifications of the aorta, abdominal branch vessels and coronary arteries.  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  Liver, spleen, pancreas, kidneys, and adrenal glands otherwise normal for technique.  Normal appendix.  Mild sigmoid diverticulosis without definite evidence of  diverticulitis.  Remaining bowel loops otherwise unremarkable.  Stomach incompletely distended, unable to exclude gastric wall thickening.  Beam hardening artifacts in pelvis from LEFT hip prosthesis and regional metallic debris.  Bladder and ureters unremarkable.  RIGHT inguinal hernia containing fat.  No additional mass, adenopathy, free air, free fluid, or acute osseous findings.  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  Extensive atherosclerotic disease.  Sigmoid diverticulosis without evidence of diverticulitis.  Unable to exclude gastric wall thickening.  RIGHT inguinal hernia containing fat.   Electronically Signed   By: Lavonia Dana M.D.   On: 05/09/2014 19:24   Dg Chest Port 1 View  05/09/2014   CLINICAL DATA:  Hypotension.  Mid abdominal pain.  EXAM: PORTABLE CHEST - 1 VIEW  COMPARISON:  04/12/2014  FINDINGS: Heart size and pulmonary vascularity are normal. There is slight interstitial accentuation at the left lung base with chronic slight elevation of the left hemidiaphragm. CABG.  Previous lower anterior cervical fusion. Severe arthritis of the right shoulder.  IMPRESSION: No acute abnormalities.   Electronically Signed   By: Lorriane Shire M.D.   On: 05/09/2014 16:51     EKG Interpretation   Date/Time:  Tuesday May 09 2014 16:17:35 EST Ventricular Rate:  102 PR Interval:  190 QRS Duration: 102 QT  Interval:  354 QTC Calculation: 461 R Axis:   -8 Text Interpretation:  Sinus tachycardia Left ventricular hypertrophy with  repolarization abnormality Nonspecific T wave abnormality Confirmed by  Ashok Cordia  MD, Lennette Bihari ([id number]) on 05/09/2014 4:30:28 PM      MDM   Final diagnoses:  Hypotension, unspecified hypotension type  AKI (acute kidney injury)  Dehydration   74 yo M hx of COPD, DMII, CAD, CHF, PE on Coumadin, CVA, CKD, hx of orthostasis, presents with CC of hypotension  Physical exam as above.  Pt hypotensive on  arrival to 49'T systolic, but mentating well, no lethargy.  EKG with sinus tachycardia, and LBBB (which is old).  Pt given 1 L fluids, with improvement in SBP to 90's.  Given another 500 cc, with improvement to low 100's.    Labs demonstrated mild leukocytosis 13, INR 2.4, BUN/Cr elevated 24/1.93, Troponin WNL, BNP only slightly elevated 102, LA slightly elevated 2.34.  UA negative for UTI.  Blood Cx pending.    CXR did not reveal acute disease.  CT abdomen/pelvis demonstrated no acute findings, multiple incidental findings as above.    Diagnosis of hypotension likely 2/2 dehydration in setting of diarrheal illness with continued diuretic use; pt very fluid responsive with almost normalization of blood pressures.  Other VS WNL  Family Medicine consulted for admission.  Pt understands and agrees with plan.   Sinda Du  Pt discussed with my attending Dr. Ashok Cordia.       Sinda Du, MD 05/10/14 [place], MD 05/10/14 [place], MD 05/13/14 [id number]

## 2014-05-09 NOTE — ED Notes (Addendum)
Presents with hypotension for 3 days. Pt is pale. BP 60s. Alert. C/O SOB and dizziness

## 2014-05-09 NOTE — H&P (Signed)
[facility] Admission History and Physical Service Pager: [phone]  Patient name: [name] Medical record number: [id number] Date of birth: [birth year] Age: 74 y.o. Gender: male  Primary Care Provider: [name], MD Consultants: none Code Status: Full (confirmed on admission)  Chief Complaint: Generalized weakness, dizziness, diarrhea  Assessment and Plan: [name] is a 74 y.o. male presenting with worsening generalized weakness, lightheadedness/dizziness for past 3 days, suspected to be due dehydration with recent diarrhea. 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  # Hypotension / Generalized Weakness, in setting of known orthostatic hypotension - Improved - On admission, BP 64/36 improved rapidly s/p 1L bolus to SBP 90s. Suspected due to dehydration with recent diarrhea and presumed GI illness (+sick contact at home with similar diarrhea). Elevated Cr and BUN suggestive of pre-renal etiology, supports dehydration, mild clinical dehydration one exam. Additionally continuing home diuretics with torsemide and poor recent PO intake. Known history of orthostatic hypotension (recently lowered anti-HTNs in clinic on 05/05/14). Unlikely infectious etiology, exam non-focal, WBC 13, CXR neg, UA neg, Lactic acid 2.3 > 0.9. CT Abd negative for acute cause of abd pain or diarrhea. - Admit to FPTS, telemetry, attending Dr. Gwendlyn Deutscher - telemetry monitoring, cont pulse ox, O2 PRN - monitor BP closely. Improved to SBP 98, s/p 2L total IVF - SLIV and cautious with IVF given systolic CHF. May do 500cc bolus PRN if fluid status stable - Hold home anti-HTN meds - check orthostatics in AM - strict bed rest overnight, up with assistance in AM - c/s PT/OT eval in AM - f/u AM labs BMET / CBC, TSH -  Check C Diff PCR  # AKI, suspected due to dehydration - Baseline Cr about 1.1, elevated on admit to 1.80, elevated BUN suggestive of pre-renal etiology - s/p 2L IVF boluses for hypotension - SLIV but may resume diet - Avoid nephrotoxic medications (hold home ACEi, Torsemide, no NSAIDs) - Trend Cr on BMETs  # Chronic Systolic CHF - Last ECHO 08/1441 with reduced EF 40-45% - Clinically appears mild dehydrated / dry on exam, wt 253 (down 2 lbs since last OV < 1 week ago), lungs clear, CXR with some vasc congestion but no edema. - S/p 2L IVF boluses - SLIV and cautious with IVF given systolic CHF. May do 500cc bolus PRN if fluid status stable - Hold home diuretic (Torsemide) - Strict I/Os, daily wts - Consider repeat ECHO  # Chronic H/o DVT/PE, on anticoagulation - Continue home coumadin (consult per pharm)  # Osteoarthritis, R-shoulder and Back - Stable chronic pain - Ordered Tylenol 672m q 6 hr scheduled (normally takes Tylenol 4 with codeine at home, holding) - ordered Tramadol 540mq 6 hr PRN  # Type 2 Diabetes - Last HgbA1c 8.7 (11/2013) - Check A1c in AM - continue home NPH insulin, reduced dose to 15u BID wc (from 35u BID) - SSI - Check CBG q 4 hr ac  # COPD - Stable. No wheezing on exam. - Monitor  # HTN - Hypotensive on admission - Holding home anti-HTN meds  # BPH - Hold home Flomax  # Chronic Anxiety / Depression, Chronic Pain - Stable mood. - Continue home Ativan 0.2561mhs - Continue home Effexor 37.5 mg qhs  # GERD - continue daily PPI  FEN/GI: SLIV / Heart healthy / carb mod diet Prophylaxis: chronic anticoagulation  with coumadin per pharm  Disposition: Admit to [place] telemetry for close BP monitoring with hypotension (likely orthostatic) on admission due to likely dehydration from recent diarrhea. S/p 2L IVF bolus, improved BP. Monitor overnight, follow-up in AM, PT / OT recs.  History of Present Illness: [name] is a 74 y.o. male presenting  with hypotension, with concern for dehydration. He states that symptoms started following recent episodes of diarrhea, denies any known trigger or foods that may have caused diarrhea, but admits wife had similar diarrhea a few days ago at home. Described diarrhea up to 9x daily for 2 days (mostly watery, some small amount loose stool, non-bloody), gradual improvement with no BMs and no diarrhea or BM in past 24 hours, denies any antibiotics in past 2 weeks. He admits to feeling symptoms of lightheadedness, dizziness, generalized weakness, worsening over past 3 days. Additionally, reports recent poor appetite for 3 days, tolerating drinking fluids well. Previously with some abdominal cramping, currently resolved. Today he didn't get out of bed because he felt worse, waited for [facility] PT to get there and on ambulation he continued to feel worse, BP checked with SBP 70s,  and was sent to ED for evaluation.  Recent hospitalization (1/20 to 04/13/2014), describes similar episodes of diarrhea prior to hospitalization, admitted at that time for AMS and unsteady gait, ultimately suspected to be due to sedation from polypharmacy with negative work-up.  Admits to chronic bilateral shoulder pain (R>L) and LBP. Denies any nausea, vomiting, chest pain / tightness, SOB, syncope / LOC, falls, dysuria, hematuria, rectal bleeding or dark stools.  In ED received 1 L IVF due to hypotension with BP 64/36, improved to SBP 90s after 1L rapidly per ED with improved symptoms. Later SBP down to 80s received additional 500 cc bolus with improvement to SBP 100 prior to transfer to floor.  Lives at home with Wife and her daughter. Currently has [place] since last hospitalization. Uses cane vs walker as needed, otherwise mostly functional able to perform all ADLs. No home O2.   Review Of Systems: Per HPI with the following additions: none Otherwise 12 point review of systems was performed and was unremarkable.  Patient Active Problem List    Diagnosis Date Noted  . AKI (acute kidney injury) 05/09/2014  . Altered mental status   . Delirium 04/12/2014  . Unsteady gait 04/12/2014  . Orthostatic hypotension 05/16/2013  . Generalized weakness 04/30/2013  . Left hip pain 12/10/2012  . At high risk for falls 07/19/2012  . Diarrhea 04/06/2012  . Vitamin D deficiency 03/12/2012  . Falls 02/17/2012  . Bradycardia 01/22/2012  . Hemorrhoid 01/12/2012  . GERD (gastroesophageal reflux disease) 11/10/2011  . Knee pain, bilateral 10/14/2011  . Leg edema 09/20/2011  . Fatigue 07/04/2011  . Peripheral vascular disease, unspecified 04/16/2011  . DM (diabetes mellitus), type 2, uncontrolled 04/16/2011  . Vancomycin adverse reaction 03/13/2011  . COPD (chronic obstructive pulmonary disease) 02/27/2011  . Stasis dermatitis 12/04/2010  . BPH (benign prostatic hyperplasia) 11/01/2010  . Long term (current) use of anticoagulants 05/03/2010  . FROZEN RIGHT SHOULDER 02/25/2010  . DIABETIC PERIPHERAL NEUROPATHY 12/20/2009  . DEPRESSION 07/08/2006  . HYPERTENSION 07/08/2006  . HYPERCHOLESTEROLEMIA 05/21/2006  . Anxiety state 05/21/2006  . CORONARY, ARTERIOSCLEROSIS 05/21/2006  . CHF - EJECTION FRACTION < 50% 05/21/2006  . REFLUX ESOPHAGITIS 05/21/2006  . APNEA, SLEEP 05/21/2006   Past Medical History: Past Medical History  Diagnosis Date  . Depression   . Diabetes mellitus   . COPD (  chronic obstructive pulmonary disease)   . Adhesive capsulitis   . MRSA bacteremia     2011 - possible endocarditis, received 6 weeks IV treatment  . Hyperlipidemia   . Anxiety   . GERD (gastroesophageal reflux disease)   . CAD (coronary artery disease)   . 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 CVA (cerebral infarction) Questionable history  . Diverticulosis   . Osteomyelitis 2012    Sternoclavicular joint   . Chronic back pain   . Angina   . Myocardial infarction   . CHF (congestive heart failure)   . Shortness of  breath   . Sleep apnea   . Chronic kidney disease     hx of BPH  . Neuromuscular disorder     HX of diabetic periferal neuropathy  . Hypertension   . Peripheral vascular disease   . On home oxygen therapy     "1.5L prn" (04/12/2014)   Past Surgical History: Past Surgical History  Procedure Laterality Date  . Septic arthritis      Removal of infected CABG wire by Dr Arlyce Dice - 2011  . Coronary artery bypass graft  1992  . Fracture surgery  1980s    Hip  . Spine surgery  2002    Cervical fusion vertebroplasty C3-4-5  . Coronary stent placement  1999, 2002  . Doppler echocardiography  Sept 2011    EF 50-55% with some impaired diastolic relaxation  . Colonoscopy  2006     Multiple polyps removed, repeat in 5 years  . Right iliopopliteal bypass - [place]  . Femoral-popliteal bypass graft  2008    Left  . Cardiac catheterization  October 18, 2010    Known obstructive disease, no further blockages  . Cardiac catheterization  03/17/2005    EF 35-40%  . US echocardiography  03/04/2006    EF 50-55%  . Amputation  03/16/2011    Procedure: AMPUTATION DIGIT;  Surgeon: Angelia Mould, MD;  Location: [facility] OR;  Service: Vascular;  Laterality: Left;  Great toe   Social History: History  Substance Use Topics  . Smoking status: Former Smoker    Quit date: 06/19/2000  . Smokeless tobacco: Never Used  . Alcohol Use: No   Additional social history: Denies smoking, alcohol use, drugs. History former smoker since quit.  Please also refer to relevant sections of EMR.  Family History: Family History  Problem Relation Age of Onset  . Heart disease Father   . Heart disease Mother    Allergies and Medications: Allergies  Allergen Reactions  . Vancomycin Other (See Comments)    "red man syndrome"  . Diazepam Anxiety    REACTION: makes patient cry   No current facility-administered medications on file prior to encounter.   Current Outpatient Prescriptions on File Prior to  Encounter  Medication Sig Dispense Refill  . Acetaminophen (TYLENOL ARTHRITIS PAIN PO) Take 1-2 tablets by mouth 2 (two) times daily as needed (for arthritis pain).    Marland Kitchen acetaminophen-codeine (TYLENOL #4) 300-60 MG per tablet Take 1 tablet by mouth every 8 (eight) hours as needed for moderate pain. 30 tablet 2  . bisoprolol (ZEBETA) 5 MG tablet Take 0.5 tablets (2.5 mg total) by mouth daily. 90 tablet 3  . insulin NPH Human (HUMULIN N,NOVOLIN N) 100 UNIT/ML injection Inject 0.35 mLs (35 Units total) into the skin 2 (two) times daily before a meal. 10 mL 11  . lisinopril (PRINIVIL,ZESTRIL) 5 MG tablet  Take 1 tablet (5 mg total) by mouth daily. 30 tablet 1  . LORazepam (ATIVAN) 0.5 MG tablet Take 0.5 tablets (0.25 mg total) by mouth at bedtime as needed for anxiety. 20 tablet 0  . meclizine (ANTIVERT) 25 MG tablet Take 1 tablet (25 mg total) by mouth 3 (three) times daily as needed for dizziness. 30 tablet 0  . omeprazole (PRILOSEC) 40 MG capsule TAKE ONE CAPSULE BY MOUTH ONCE DAILY 30 capsule 3  . simvastatin (ZOCOR) 80 MG tablet TAKE ONE TABLET BY MOUTH AT BEDTIME 90 tablet 3  . tamsulosin (FLOMAX) 0.4 MG CAPS capsule Take 1 capsule (0.4 mg total) by mouth daily after supper. 90 capsule 1  . torsemide (DEMADEX) 20 MG tablet TAKE ONE TABLET BY MOUTH EVERY DAY 60 tablet 2  . venlafaxine XR (EFFEXOR-XR) 37.5 MG 24 hr capsule TAKE ONE CAPSULE BY MOUTH ONCE DAILY 30 capsule 11  . warfarin (COUMADIN) 5 MG tablet Take 5 mg by mouth daily at 6 PM.     . Blood Glucose Monitoring Suppl (ONE TOUCH ULTRA 2) W/DEVICE KIT Please supply patient with 1 glucometer device to use 4 times daily to check blood sugars.  Dx code: 250.0 1 each 1  . glucose blood test strip Use as instructed.  One Step Ultra 2 strips. 100 each 12  . Insulin Syringe-Needle U-100 (B-D INS SYRINGE 0.5CC/30GX1/2") 30G X 1/2" 0.5 ML MISC For use with insulin 100 each 0  . lidocaine (LIDODERM) 5 % Place 1 patch onto the skin daily. Remove &  Discard patch within 12 hours or as directed by MD 30 patch 0  . NEEDLE, DISP, 30 G (B-D DISP NEEDLE 30GX1") 30G X 1" MISC For using with insulin. 100 each 0  . nitroGLYCERIN (NITROSTAT) 0.4 MG SL tablet Place 1 tablet (0.4 mg total) under the tongue as needed. For chest pains 30 tablet 6  . traMADol (ULTRAM) 50 MG tablet TAKE ONE TO TWO TABLETS BY MOUTH EVERY 8 HOURS AS NEEDED (Patient not taking: Reported on 02/08/2014) 30 tablet 1  . zoster vaccine live, PF, (ZOSTAVAX) 30076 UNT/0.65ML injection Inject 19,400 Units into the skin once. 1 each 0    Objective: BP 110/45 mmHg  Pulse 87  Temp(Src) 97.9 F (36.6 C) (Rectal)  Resp 18  SpO2 98% Exam: General: chronically ill-appearing 75 yr M, laying on left side in bed, comfortable, NAD HEENT: NCAT, PERRL, EOMI, oropharynx clear, mild dry MM and tongue Cardiovascular: RRR, S1, S2, no murmurs Respiratory: CTAB without wheezing, focal crackles, or rhonchi. Non-labored. Speaks full sentences.  Abdomen: obese, soft, NTND, no rebound, no guarding, no masses, +active BS in all quads Extremities: bilateral LE mild tenderness to palpation (L>R), no significant pitting edema would consider trace edema, symmetric without warmth or erythema. Intact distal pulses +2  Skin: warm, dry, bilateral lower ext with hyperpigmentation in stocking distribution consistent with chronic venous changes Neuro: awake, alert, oriented x 3, grossly non-focal, CN-II-XII intact, muscle str distal grip and dorisflexion ankles intact 5/5, distal sensation to light touch intact. Able to sit up in bed, did not stand due to concern with hypotension.  Labs and Imaging: CBC BMET   Recent Labs Lab 05/09/14 1631 05/09/14 1715  WBC 13.0*  --   HGB 15.0 14.6  HCT 44.7 43.0  PLT 207  --     Recent Labs Lab 05/09/14 1631 05/09/14 1715  NA 136 139  K 4.1 4.7  CL 102 100  CO2 23  --  BUN 24* 34*  CREATININE 1.93* 1.80*  GLUCOSE 148* 139*  CALCIUM 8.8  --      Lactic  Acid 2.34 >> 0.91  Troponin-I - negative, (Trop-poc, negative)  BNP - 102.4  UA - neg nitrite, neg leuks, neg hgb, rare bacteria, rare squam  2/16 CXR Portable 1v FINDINGS: Heart size and pulmonary vascularity are normal. There is slight interstitial accentuation at the left lung base with chronic slight elevation of the left hemidiaphragm. CABG.  Previous lower anterior cervical fusion. Severe arthritis of the right shoulder.  IMPRESSION: No acute abnormalities  2/16 CT Abd / Pelvis IMPRESSION: Stable fat containing mass at inferior pole RIGHT kidney consistent with an angiomyolipoma.  Extensive atherosclerotic disease.  Sigmoid diverticulosis without evidence of diverticulitis.  Unable to exclude gastric wall thickening.  RIGHT inguinal hernia containing fat.   Nobie Putnam, DO 05/09/2014, 8:22 PM PGY-2, [facility] Intern pager: [phone], text pages welcome

## 2014-05-10 ENCOUNTER — Encounter (HOSPITAL_COMMUNITY): Payer: Self-pay | Admitting: *Deleted

## 2014-05-10 DIAGNOSIS — R197 Diarrhea, unspecified: Secondary | ICD-10-CM

## 2014-05-10 DIAGNOSIS — I959 Hypotension, unspecified: Secondary | ICD-10-CM | POA: Diagnosis present

## 2014-05-10 DIAGNOSIS — I5022 Chronic systolic (congestive) heart failure: Secondary | ICD-10-CM

## 2014-05-10 DIAGNOSIS — N179 Acute kidney failure, unspecified: Principal | ICD-10-CM

## 2014-05-10 LAB — GLUCOSE, CAPILLARY
GLUCOSE-CAPILLARY: 143 mg/dL — AB (ref 70–99)
GLUCOSE-CAPILLARY: 159 mg/dL — AB (ref 70–99)
Glucose-Capillary: 126 mg/dL — ABNORMAL HIGH (ref 70–99)
Glucose-Capillary: 138 mg/dL — ABNORMAL HIGH (ref 70–99)
Glucose-Capillary: 224 mg/dL — ABNORMAL HIGH (ref 70–99)

## 2014-05-10 LAB — BASIC METABOLIC PANEL
Anion gap: 3 — ABNORMAL LOW (ref 5–15)
BUN: 19 mg/dL (ref 6–23)
CALCIUM: 8.3 mg/dL — AB (ref 8.4–10.5)
CO2: 28 mmol/L (ref 19–32)
CREATININE: 1.12 mg/dL (ref 0.50–1.35)
Chloride: 106 mmol/L (ref 96–112)
GFR calc Af Amer: 72 mL/min — ABNORMAL LOW (ref >90)
GFR calc non Af Amer: 62 mL/min — ABNORMAL LOW (ref >90)
GLUCOSE: 148 mg/dL — AB (ref 70–99)
Potassium: 3.5 mmol/L (ref 3.5–5.1)
Sodium: 137 mmol/L (ref 135–145)

## 2014-05-10 LAB — CBC
HCT: 39 % (ref 39.0–52.0)
Hemoglobin: 12.6 g/dL — ABNORMAL LOW (ref 13.0–17.0)
MCH: 26.7 pg (ref 26.0–34.0)
MCHC: 32.3 g/dL (ref 30.0–36.0)
MCV: 82.6 fL (ref 78.0–100.0)
PLATELETS: 147 10*3/uL — AB (ref 150–400)
RBC: 4.72 MIL/uL (ref 4.22–5.81)
RDW: 17 % — ABNORMAL HIGH (ref 11.5–15.5)
WBC: 9 10*3/uL (ref 4.0–10.5)

## 2014-05-10 LAB — CLOSTRIDIUM DIFFICILE BY PCR: Toxigenic C. Difficile by PCR: NEGATIVE

## 2014-05-10 LAB — PROTIME-INR
INR: 2.36 — AB (ref 0.00–1.49)
PROTHROMBIN TIME: 26 s — AB (ref 11.6–15.2)

## 2014-05-10 LAB — TSH: TSH: 1.088 u[IU]/mL (ref 0.350–4.500)

## 2014-05-10 MED ORDER — INSULIN NPH (HUMAN) (ISOPHANE) 100 UNIT/ML ~~LOC~~ SUSP
35.0000 [IU] | Freq: Two times a day (BID) | SUBCUTANEOUS | Status: DC
Start: 2014-05-10 — End: 2014-06-06

## 2014-05-10 MED ORDER — VENLAFAXINE HCL ER 37.5 MG PO CP24
37.5000 mg | ORAL_CAPSULE | Freq: Every day | ORAL | Status: DC
  Administered 2014-05-10 (×2): 37.5 mg via ORAL
  Filled 2014-05-10 (×3): qty 1

## 2014-05-10 MED ORDER — WARFARIN SODIUM 5 MG PO TABS
5.0000 mg | ORAL_TABLET | Freq: Every day | ORAL | Status: DC
  Administered 2014-05-10: 5 mg via ORAL
  Filled 2014-05-10 (×3): qty 1

## 2014-05-10 MED ORDER — SODIUM CHLORIDE 0.9 % IV BOLUS (SEPSIS): 500.0000 mL | Freq: Once | INTRAVENOUS | Status: AC | PRN

## 2014-05-10 MED ORDER — LORAZEPAM 0.5 MG PO TABS
0.2500 mg | ORAL_TABLET | Freq: Every evening | ORAL | Status: DC | PRN
  Administered 2014-05-10: 0.25 mg via ORAL
  Filled 2014-05-10: qty 1

## 2014-05-10 MED ORDER — SODIUM CHLORIDE 0.9 % IJ SOLN
3.0000 mL | Freq: Two times a day (BID) | INTRAMUSCULAR | Status: DC
  Administered 2014-05-10 – 2014-05-11 (×4): 3 mL via INTRAVENOUS

## 2014-05-10 MED ORDER — ONDANSETRON HCL 4 MG PO TABS: 4.0000 mg | ORAL_TABLET | Freq: Four times a day (QID) | ORAL | Status: DC | PRN

## 2014-05-10 MED ORDER — ATORVASTATIN CALCIUM 40 MG PO TABS
40.0000 mg | ORAL_TABLET | Freq: Every day | ORAL | Status: DC
  Administered 2014-05-10: 40 mg via ORAL
  Filled 2014-05-10 (×2): qty 1

## 2014-05-10 MED ORDER — PANTOPRAZOLE SODIUM 40 MG PO TBEC
40.0000 mg | DELAYED_RELEASE_TABLET | Freq: Every day | ORAL | Status: DC
  Administered 2014-05-10 – 2014-05-11 (×2): 40 mg via ORAL
  Filled 2014-05-10 (×2): qty 1

## 2014-05-10 MED ORDER — BISOPROLOL FUMARATE 5 MG PO TABS: 2.5000 mg | ORAL_TABLET | Freq: Every day | ORAL | Status: DC

## 2014-05-10 MED ORDER — WARFARIN - PHARMACIST DOSING INPATIENT: Freq: Every day | Status: DC

## 2014-05-10 MED ORDER — ACETAMINOPHEN 325 MG PO TABS
650.0000 mg | ORAL_TABLET | Freq: Four times a day (QID) | ORAL | Status: DC
  Administered 2014-05-10 – 2014-05-11 (×5): 650 mg via ORAL
  Filled 2014-05-10 (×5): qty 2

## 2014-05-10 MED ORDER — ONDANSETRON HCL 4 MG/2ML IJ SOLN: 4.0000 mg | Freq: Four times a day (QID) | INTRAMUSCULAR | Status: DC | PRN

## 2014-05-10 MED ORDER — INSULIN NPH (HUMAN) (ISOPHANE) 100 UNIT/ML ~~LOC~~ SUSP
15.0000 [IU] | Freq: Two times a day (BID) | SUBCUTANEOUS | Status: DC
  Administered 2014-05-10 – 2014-05-11 (×3): 15 [IU] via SUBCUTANEOUS
  Filled 2014-05-10: qty 10

## 2014-05-10 MED ORDER — LORAZEPAM 0.5 MG PO TABS
0.5000 mg | ORAL_TABLET | ORAL | Status: DC | PRN
  Administered 2014-05-10: 0.5 mg via ORAL
  Filled 2014-05-10: qty 1

## 2014-05-10 MED ORDER — OMEPRAZOLE 40 MG PO CPDR: 40.0000 mg | DELAYED_RELEASE_CAPSULE | Freq: Every day | ORAL | Status: AC

## 2014-05-10 MED ORDER — TRAMADOL HCL 50 MG PO TABS
50.0000 mg | ORAL_TABLET | Freq: Four times a day (QID) | ORAL | Status: DC | PRN
  Administered 2014-05-10 – 2014-05-11 (×2): 50 mg via ORAL
  Filled 2014-05-10 (×2): qty 1

## 2014-05-10 MED ORDER — INSULIN ASPART 100 UNIT/ML ~~LOC~~ SOLN
0.0000 [IU] | Freq: Three times a day (TID) | SUBCUTANEOUS | Status: DC
  Administered 2014-05-10: 3 [IU] via SUBCUTANEOUS
  Administered 2014-05-10 – 2014-05-11 (×4): 2 [IU] via SUBCUTANEOUS

## 2014-05-10 NOTE — Progress Notes (Signed)
Utilization review completed.  

## 2014-05-10 NOTE — Evaluation (Signed)
Physical Therapy Evaluation Patient Details Name: [name] MRN: [id number] DOB: July 11, 1940 Today's Date: 05/10/2014   History of Present Illness  74 Y/O M with pmx of CHF, DM2, HLD,Depression,CAD presented with hx of dizziness and weakness for the last 2-3 days. He also mentioned he has been having diarrhea and vomiting prior to his feeling this way  Clinical Impression  Pt admitted with above complications. Pt currently with functional limitations due to the deficits listed below (see PT Problem List). States he needed some assistance with ADLs prior to admission but now feels much weaker. Require min guard to min assist for transfers and ambulation. Feel he would benefit from short term SNF before returning home, in order to improve his strength/endurance and independence. Pt will benefit from skilled PT to increase their independence and safety with mobility to allow discharge to the venue listed below.       Follow Up Recommendations SNF;Supervision for mobility/OOB    Equipment Recommendations  None recommended by PT    Recommendations for Other Services       Precautions / Restrictions Precautions Precautions: Fall Restrictions Weight Bearing Restrictions: No      Mobility  Bed Mobility Overal bed mobility:  (Pt received up in recliner upon OT arrival)                Transfers Overall transfer level: Needs assistance Equipment used: Straight cane Transfers: Sit to/from [employer] Transfers Sit to Stand: Min guard Stand pivot transfers: Min guard       General transfer comment: Pt required Min guard assist during functional mobility, initial sit to stand from recliner and transfers in room today, he states that he has been weak since this illness.  Ambulation/Gait Ambulation/Gait assistance: Min assist Ambulation Distance (Feet): 120 Feet Assistive device: Rolling walker (2 wheeled) Gait Pattern/deviations: Step-through  pattern;Trendelenburg;Antalgic;Drifts right/left;Trunk flexed Gait velocity: decreased   General Gait Details: Moderately antalgic gait pattern with min assist for stability but min guard for majority of bout. Slightly buckling noted but able to correct with UEs through use of RW.  Cues for upright posture, forward gaze, and educated on safe use of DME with rolling walker.  Stairs            Wheelchair Mobility    Modified Rankin (Stroke Patients Only)       Balance Overall balance assessment: Needs assistance Sitting-balance support: No upper extremity supported;Feet supported Sitting balance-[name]: Good     Standing balance support: No upper extremity supported;During functional activity Standing balance-[name]: Fair Standing balance comment: prior to sitting, pt able to stand briefly without UE support                             Pertinent Vitals/Pain Pain Assessment: 0-10 Pain Score: 4  Pain Location: shoulders and neck Pain Descriptors / Indicators: Aching Pain Intervention(s): Monitored during session;Repositioned;Premedicated before session    Home Living Family/patient expects to be discharged to:: Private residence Living Arrangements: Spouse/significant other;Other relatives Available Help at Discharge: Family (Pt states that his wife is not in good health) Type of Home: House Home Access: Stairs to enter Entrance Stairs-Rails: None Entrance Stairs-Number of Steps: 2 Home Layout: One level Home Equipment: [place] - 2 wheels;Walker - 4 wheels;Cane - single point;Bedside commode;Tub bench;Wheelchair - power      Prior Function Level of Independence: Needs assistance   Gait / Transfers Assistance Needed: uses cane for majority of ambulation  in home  ADL's / Homemaking Assistance Needed: States wife assists with bath/dress  Comments: used cane mostly for mobility, sometimes rollator     Hand Dominance   Dominant Hand: Right     Extremity/Trunk Assessment   Upper Extremity Assessment: Generalized weakness;RUE deficits/detail;LUE deficits/detail RUE Deficits / Details: Old RTC injury/arthritis, impaired shoulder flexion and inability to reach behind his head; R > impairment than L noted. Wife washes hair for him.     LUE Deficits / Details: Old RTC injury/arthritis, impaired shoulder flexion and inability to reach behind his head; R > impairment than L noted. Wife washes hair for him.   Lower Extremity Assessment: Generalized weakness         Communication   Communication: No difficulties  Cognition Arousal/Alertness: Awake/alert Behavior During Therapy: WFL for tasks assessed/performed Overall Cognitive Status: Within Functional Limits for tasks assessed                      General Comments General comments (skin integrity, edema, etc.): Patient states he cannot use a RW at home because of limited space. He will need to be able to ambulate with his cane.    Exercises General Exercises - Lower Extremity Ankle Circles/Pumps: AROM;Both;10 reps;Seated Quad Sets: Strengthening;Both;10 reps;Seated Gluteal Sets: Strengthening;Both;10 reps;Seated Long Arc Quad: Strengthening;Both;10 reps;Seated Hip Flexion/Marching: Strengthening;Both;10 reps;Seated      Assessment/Plan    PT Assessment Patient needs continued PT services  PT Diagnosis Difficulty walking;Abnormality of gait;Generalized weakness   PT Problem List Decreased strength;Decreased range of motion;Decreased activity tolerance;Decreased mobility;Decreased balance;Decreased knowledge of use of DME;Pain  PT Treatment Interventions DME instruction;Gait training;Stair training;Functional mobility training;Therapeutic activities;Therapeutic exercise;Neuromuscular re-education;Balance training;Patient/family education;Modalities   PT Goals (Current goals can be found in the Care Plan section) Acute Rehab PT Goals Patient Stated Goal: Go home PT  Goal Formulation: With patient Time For Goal Achievement: 05/24/14 Potential to Achieve Goals: Good    Frequency Min 3X/week   Barriers to discharge Decreased caregiver support States he often has to help his wife with her ADLs    Co-evaluation               End of Session Equipment Utilized During Treatment: Gait belt Activity Tolerance: Patient tolerated treatment well Patient left: in chair;with call bell/phone within reach Nurse Communication: Mobility status         Time: 0936-1005 PT Time Calculation (min) (ACUTE ONLY): 29 min   Charges:   PT Evaluation $Initial PT Evaluation Tier I: 1 Procedure PT Treatments $Gait Training: 8-22 mins   PT G CodesEllouise Newer 05/10/2014, 10:40 AM Elayne Snare, [place]

## 2014-05-10 NOTE — Discharge Instructions (Addendum)

## 2014-05-10 NOTE — Discharge Summary (Signed)
[facility] Discharge Summary  Patient name: [name] Medical record number: [id number] Date of birth: [birth year] Age: 74 y.o. 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  Abdomen: S, NT, Nd, +BS Extremities: WWP, Changes consistent with poor blood flow to the periphery. No rashes, no lesions of the lower extremities, No ulcerations.  Skin: Cracked, dry, and discolored skin consistent with poor blood flow to the BL Lower extremities.  Neuro: AAOx3, No focal deficits  Brief Hospital Course:  [name] is a 74 y.o. male who presented with worsening generalized weakness, lightheadedness/dizziness for past 3 days, suspected to be due to dehydration with recent diarrhea. 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  # Hypotension / Generalized Weakness, in setting of known orthostatic hypotension - Pt. Was admitted with blood pressures to 64/36 on admission in the ED. He was symptomatic with dizziness and weakness in the ED as well. Upon chart review, he does have  some light headedness with known orthostasis at baseline. However, his current low blood pressures were in the setting of recent diarrhea that has since resolved likely contributing to even lower blood pressures. He was given gentle IV fluid rehydration during his admission given his history of CHF. His blood pressure medications were also held during this admission. Orthostatic blood pressures were positive by pulse with pulse going from 88 to 112. His blood pressures responded nicely to IV fluid rehydration, and he was found to be safe and stable for discharge to SNF for physical therapy and outpatient rehabilitation.   # AKI - Pt. Admitted with recent history of diarrhea that has since resolved. He was found to have an elevated creatinine to 1.8 with a baseline around 1.1. His AKI was found to be prerenal in etiology. He was started on IV fluids due to the above problem. His creatinine improved to 0.94 two days after admission, and by discharge his AKI was resolved.   Other Medical Problems Managed During this Hospitalization:  Chronic Systolic CHF - Echo 05/158 with EF 40-45%. Given gentle IV fluids for above problems, but without evidence of peripheral edema or pulmonary findings consistent with fluid overload on exam. Home Torsemide was held during this hospitalization and will be resumed at discharge.  Hx of DVT / PE - Pt. Chronically anticoagulated on Coumadin per pharm with INR of 2.45 on admission. Well managed. Coumadin continued here.  Osteoarthritis of R Shoulder, and Back - Scheduled Tylenol 645m q6hrs. Here with Tramadol 552mq6hrs as needed for break through pain. Pain well controlled here. On Tylenol 4 with codeine at home for pain control.  DMII  - Pt. With Hgb A1C of 8.7 in 11/2013. A1C was ordered here, and was 7.4. He was continued on his home NPH insulin here however with  a reduced dose to 15 units BID wc here from 35 units BID. Sliding Scale Insulin was ordered for breakthrough glucose  control.  COPD - No interventions during this hospitalization.  HTN - On lisinopril, Demadex, and Zebeta. These were held on admission due to hypotension. Continue to titrate to tolerance as an outpatient.  BPH - Held home flomax here. Continue at discharge.  Chronic Anxiety / Depression - Continued home ativan 0.51m nightly, as well as Effexor 37.5 mg each night.  GERD - Continued on daily PPI here.   Issues for Follow Up:  1. Follow up Orthostasis and improvement in blood pressure control.  2. Follow up titration of blood pressure meds.  3. Follow up fluid status post- IV fluids   Significant Procedures: None  Significant Labs and Imaging:   Recent Labs Lab 05/09/14 1631 05/09/14 1715 05/10/14 0525 05/11/14 0500  WBC 13.0*  --  9.0 9.2  HGB 15.0 14.6 12.6* 13.7  HCT 44.7 43.0 39.0 42.1  PLT 207  --  147* 163    Recent Labs Lab 05/09/14 1631 05/09/14 1715 05/10/14 0525 05/11/14 0500  NA 136 139 137 139  K 4.1 4.7 3.5 4.2  CL 102 100 106 108  CO2 23  --  28 24  GLUCOSE 148* 139* 148* 133*  BUN 24* 34* 19 14  CREATININE 1.93* 1.80* 1.12 0.94  CALCIUM 8.8  --  8.3* 9.0  ALKPHOS 70  --   --   --   AST 22  --   --   --   ALT 20  --   --   --   ALBUMIN 3.9  --   --   --     INR > 2.45 > 2.36 C.Diff - Negative  TSH - 1.088  Results/Tests Pending at Time of Discharge: None  Discharge Medications:    Medication List    STOP taking these medications        zoster vaccine live (PF) 19400 UNT/0.65ML injection  Commonly known as:  ZOSTAVAX      TAKE these medications        acetaminophen-codeine 300-60 MG per tablet  Commonly known as:  TYLENOL #4  Take 1 tablet by mouth every 8 (eight) hours as needed for moderate pain.     bisoprolol 5 MG tablet  Commonly known as:  ZEBETA  Take 0.5 tablets (2.5 mg total) by mouth daily.     glucose blood test strip  Use as instructed.  One Step Ultra 2 strips.     insulin NPH Human 100 UNIT/ML injection  Commonly  known as:  HUMULIN N,NOVOLIN N  Inject 0.35 mLs (35 Units total) into the skin 2 (two) times daily before a meal.     Insulin Syringe-Needle U-100 30G X 1/2" 0.5 ML Misc  Commonly known as:  B-D INS SYRINGE 0.5CC/30GX1/2"  For use with insulin     lidocaine 5 %  Commonly known as:  LIDODERM  Place 1 patch onto the skin daily. Remove & Discard patch within 12 hours or as directed by MD     lisinopril 5 MG tablet  Commonly known as:  PRINIVIL,ZESTRIL  Take 1 tablet (5 mg total) by mouth daily.     meclizine 25 MG tablet  Commonly known as:  ANTIVERT  TAKE ONE TABLET BY MOUTH THREE TIMES DAILY AS NEEDED FOR  DIZZINESS     NEEDLE (DISP) 30 G 30G X 1" Misc  Commonly known as:  B-D  DISP NEEDLE 30GX1"  For using with insulin.     nitroGLYCERIN 0.4 MG SL tablet  Commonly known as:  NITROSTAT  Place 1 tablet (0.4 mg total) under the tongue as needed. For chest pains     omeprazole 40 MG capsule  Commonly known as:  PRILOSEC  Take 1 capsule (40 mg total) by mouth daily.     ONE TOUCH ULTRA 2 W/DEVICE Kit  Please supply patient with 1 glucometer device to use 4 times daily to check blood sugars.  Dx code: 250.0     simvastatin 80 MG tablet  Commonly known as:  ZOCOR  TAKE ONE TABLET BY MOUTH AT BEDTIME     tamsulosin 0.4 MG Caps capsule  Commonly known as:  FLOMAX  Take 1 capsule (0.4 mg total) by mouth daily after supper.     torsemide 20 MG tablet  Commonly known as:  DEMADEX  TAKE ONE TABLET BY MOUTH EVERY DAY     traMADol 50 MG tablet  Commonly known as:  ULTRAM  TAKE ONE TO TWO TABLETS BY MOUTH EVERY 8 HOURS AS NEEDED     TYLENOL ARTHRITIS PAIN PO  Take 1-2 tablets by mouth 2 (two) times daily as needed (for arthritis pain).     venlafaxine XR 37.5 MG 24 hr capsule  Commonly known as:  EFFEXOR-XR  TAKE ONE CAPSULE BY MOUTH ONCE DAILY     warfarin 5 MG tablet  Commonly known as:  COUMADIN  Take 5 mg by mouth daily at 6 PM.      ASK your doctor about these  medications        LORazepam 0.5 MG tablet  Commonly known as:  ATIVAN  Take 0.5 tablets (0.25 mg total) by mouth at bedtime as needed for anxiety.        Discharge Instructions: Please refer to Patient Instructions section of EMR for full details.  Patient was counseled important signs and symptoms that should prompt return to medical care, changes in medications, dietary instructions, activity restrictions, and follow up appointments.   Follow-Up Appointments: Follow-up Information    Follow up with [facility] .   Why:  See discharge specific instructions      Aquilla Hacker, MD 05/11/2014, 8:42 AM PGY-1, [place]

## 2014-05-10 NOTE — Evaluation (Signed)
Occupational Therapy Evaluation Patient Details Name: [name] MRN: [id number] DOB: Dec 25, 1940 Today's Date: 05/10/2014    History of Present Illness 74 Y/O M with pmx of CHF, DM2, HLD,Depression,CAD presented with hx of dizziness and weakness for the last 2-3 days. He also mentioned he has been having diarrhea and vomiting prior to his feeling this way   Clinical Impression   Pt is a 74 y/o male whom was admitted as above,  presents w/ deficits in ability to perform ADL and self care tasks as well as functional mobiity related to ADL's (see OT Problem list below), he should benefit from acute OT to address deficits and assist in maximizing independence. Pt states that his wife is not in good health but that she assists w/ bathing/dressing. He demonstrates overall deconditioning, Will follow acutely.    Follow Up Recommendations  SNF;Supervision/Assistance - 24 hour    Equipment Recommendations  Other (comment) (Defer to next venue)    Recommendations for Other Services       Precautions / Restrictions Precautions Precautions: Fall Restrictions Weight Bearing Restrictions: No      Mobility Bed Mobility Overal bed mobility:  (Pt received up in recliner upon OT arrival)                Transfers Overall transfer level: Needs assistance Equipment used: Straight cane Transfers: Sit to/from [employer] Transfers Sit to Stand: Min guard Stand pivot transfers: Min guard       General transfer comment: Pt required Min guard assist during functional mobility, initial sit to stand from recliner and transfers in room today, he states that he has been weak since this illness.    Balance Overall balance assessment: Needs assistance Sitting-balance support: No upper extremity supported;Feet supported Sitting balance-[name]: Good     Standing balance support: During functional activity;No upper extremity supported Standing balance-[name]:  Fair Standing balance comment: Pt able to stand briefly w/o UE support but was noted to reach for therapist hand to steady after ~15-20 seconds                            ADL Overall ADL's : Needs assistance/impaired Eating/Feeding: Modified independent;Sitting   Grooming: Wash/dry hands;Wash/dry face;Set up;Sitting   Upper Body Bathing: Sitting;Set up;Supervision/ safety   Lower Body Bathing: Minimal assistance;Sit to/from stand   Upper Body Dressing : Set up;Sitting   Lower Body Dressing: Minimal assistance;Sit to/from stand   Toilet Transfer: Min Armed forces technical officer Details (indicate cue type and reason): Using SPC during ambulation in room Toileting- Clothing Manipulation and Hygiene: Supervision/safety;Min guard;Sit to/from stand       Functional mobility during ADLs: Min guard;Cane General ADL Comments: Pt was educated in role of OT. He presents w/ deficits in ability to perform ADL and self care tasks as well as functional mobiity related to ADL's, he should benefit from acute OT to address deficits and assist in maximizing independence. Pt states that his wife is not in good health but that she assists w/ bathing/dressing. Pt participated in ADL retraining session ambulating to sink for simulated grooming and transfer to 3:1; overall deconditioning noted. Will follow acutely.     Vision  Pt wears glasses for Reading, reports no changes from baseline.   Perception     Praxis      Pertinent Vitals/Pain Pain Assessment: 0-10 Pain Score: 4  Pain Location: shoulders and neck Pain Descriptors / Indicators: Aching Pain Intervention(s): Monitored  during session;Repositioned;Premedicated before session     Hand Dominance Right   Extremity/Trunk Assessment Upper Extremity Assessment Upper Extremity Assessment: Generalized weakness;RUE deficits/detail;LUE deficits/detail RUE Deficits / Details: Old RTC injury/arthritis,  impaired shoulder flexion and inability to reach behind his head; R > impairment than L noted. Wife washes hair for him. RUE Coordination: decreased gross motor LUE Deficits / Details: Old RTC injury/arthritis, impaired shoulder flexion and inability to reach behind his head; R > impairment than L noted. Wife washes hair for him. LUE Coordination: decreased gross motor   Lower Extremity Assessment Lower Extremity Assessment: Generalized weakness       Communication Communication Communication: No difficulties   Cognition Arousal/Alertness: Awake/alert Behavior During Therapy: WFL for tasks assessed/performed Overall Cognitive Status: Within Functional Limits for tasks assessed                     General Comments       Exercises Exercises: General Lower Extremity     Shoulder Instructions      Home Living Family/patient expects to be discharged to:: Private residence Living Arrangements: Spouse/significant other;Other relatives Available Help at Discharge: Family (Pt states that his wife is not in good health) Type of Home: House Home Access: Stairs to enter [employer] of Steps: 2 Entrance Stairs-Rails: None Home Layout: One level     Bathroom Shower/Tub: Tub/shower unit;Curtain   Bathroom Toilet: Handicapped height     Home Equipment: Environmental consultant - 2 wheels;Walker - 4 wheels;Cane - single point;Bedside commode;Tub bench;Wheelchair - power          Prior Functioning/Environment Level of Independence: Needs assistance  Gait / Transfers Assistance Needed: uses cane for majority of ambulation in home ADL's / Homemaking Assistance Needed: States wife assists with bath/dress   Comments: used cane mostly for mobility, sometimes rollator    OT Diagnosis: Generalized weakness   OT Problem List: Decreased strength;Decreased activity tolerance;Impaired balance (sitting and/or standing);Decreased knowledge of precautions;Decreased knowledge of use of DME  or AE;Impaired UE functional use   OT Treatment/Interventions: Self-care/ADL training;Energy conservation;DME and/or AE instruction;Patient/family education;Therapeutic activities;Balance training    OT Goals(Current goals can be found in the care plan section) Acute Rehab OT Goals Patient Stated Goal: Go home Time For Goal Achievement: 05/24/14 Potential to Achieve Goals: Good  OT Frequency: Min 2X/week   Barriers to D/C: Decreased caregiver support  Pt states that his wife assists with bathing and dressing but also states that she is not in good health       Co-evaluation              End of Session Equipment Utilized During Treatment: Gait belt;Other (comment) [facility]) Nurse Communication: Mobility status  Activity Tolerance: Patient tolerated treatment well Patient left: in chair;with call bell/phone within reach   Time: 1011-1030 OT Time Calculation (min): 19 min Charges:  OT General Charges $OT Visit: 1 Procedure OT Evaluation $Initial OT Evaluation Tier I: 1 Procedure G-Codes:    Almyra Deforest, OTR/L 05/10/2014, 10:43 AM

## 2014-05-10 NOTE — Progress Notes (Signed)
ANTICOAGULATION CONSULT NOTE - Follow Up Consult  Pharmacy Consult for Warfarin  Indication: Hx VTE  Allergies  Allergen Reactions  . Vancomycin Other (See Comments)    "red man syndrome"  . Diazepam Anxiety    REACTION: makes patient cry    Patient Measurements: Height: 5\' 8"  (172.7 cm) Weight: 253 lb 12 oz (115.1 kg) IBW/kg (Calculated) : 68.4   Vital Signs: Temp: 97.5 F (36.4 C) (02/16 2110) Temp Source: Oral (02/16 2110) BP: 98/46 mmHg (02/16 2350) Pulse Rate: 88 (02/16 2110)  Labs:  Recent Labs  05/09/14 1631 05/09/14 1715  HGB 15.0 14.6  HCT 44.7 43.0  PLT 207  --   LABPROT 26.4*  --   INR 2.40*  --   CREATININE 1.93* 1.80*  TROPONINI <0.03  --     Estimated Creatinine Clearance: 43.7 mL/min (by C-G formula based on Cr of 1.8).  Assessment: Warfarin for hx VTE, INR 2.4 on admit, CBC good, elevated Scr.   Goal of Therapy:  INR 2-3 Monitor platelets by anticoagulation protocol: Yes   Plan:  -Continue home warfarin dose (5mg /day) -Daily PT/INR -Monitor for bleeding  Narda Bonds 05/10/2014,12:34 AM

## 2014-05-10 NOTE — Progress Notes (Signed)
ANTICOAGULATION CONSULT NOTE - Follow Up Consult  Pharmacy Consult for coumadin Indication: hx PE  Allergies  Allergen Reactions  . Vancomycin Other (See Comments)    "red man syndrome"  . Diazepam Anxiety    REACTION: makes patient cry    Patient Measurements: Height: 5\' 8"  (172.7 cm) Weight: 254 lb 6.6 oz (115.4 kg) IBW/kg (Calculated) : 68.4   Vital Signs: Temp: 98 F (36.7 C) (02/17 0933) Temp Source: Oral (02/17 0933) BP: 119/45 mmHg (02/17 0933) Pulse Rate: 81 (02/17 0933)  Labs:  Recent Labs  05/09/14 1631 05/09/14 1715 05/10/14 0525  HGB 15.0 14.6 12.6*  HCT 44.7 43.0 39.0  PLT 207  --  147*  LABPROT 26.4*  --   --   INR 2.40*  --   --   CREATININE 1.93* 1.80* 1.12  TROPONINI <0.03  --   --     Estimated Creatinine Clearance: 70.3 mL/min (by C-G formula based on Cr of 1.12).  Assessment: Patient is a 74 y.o m on coumadin PTA for hx PE.  InR is therapeutic at 2.36 today.  No bleeding documented.   Goal of Therapy:  INR 2-3    Plan:  - continue home coumadin dose of 5mg  daily  Addis Tuohy P 05/10/2014,10:00 AM

## 2014-05-10 NOTE — Clinical Social Work Psychosocial (Signed)
Clinical Social Work Department BRIEF PSYCHOSOCIAL ASSESSMENT 05/10/2014  Patient:  [name], [name]     Account Number:  [id number]     Admit date:  05/09/2014  Clinical Social Worker:  Dian Queen  Date/Time:  05/10/2014 02:20 PM  Referred by:  Physician  Date Referred:  05/10/2014 Referred for  SNF Placement   Other Referral:   Interview type:  Patient Other interview type:    PSYCHOSOCIAL DATA Living Status:  WIFE Admitted from facility:   Level of care:   Primary support name:  Dyson Sevey Primary support relationship to patient:  SPOUSE Degree of support available:   Patient lives with his wife who can provide some support.    CURRENT CONCERNS Current Concerns  Post-Acute Placement   Other Concerns:    SOCIAL WORK ASSESSMENT / PLAN Patient is a 74 year old male who lives with his wife. Patient is alert and oriented x4 and friendly.  Patient was explained role of CSW and the process for finding a SNF once he is ready to discharge.  Patient stated he has not been to rehab before, and would like to talk to his wife about it.  Patient was explained what is to be expected at a SNF for rehab, and informed about the difference of home health vs SNF.  Patient stated he did not have any questions, and was given the list of SNFs in the area.   Assessment/plan status:   Other assessment/ plan:   Information/referral to community resources:    PATIENT'S/FAMILY'S RESPONSE TO PLAN OF CARE: Patient did not express any concerns about going to a SNF and did not have any other questions.   Jones Broom. [place], MSW, [place] 05/10/2014 2:28 PM

## 2014-05-10 NOTE — Progress Notes (Signed)
Family Medicine Teaching Service Daily Progress Note Intern Pager: [phone]  Patient name: [name] Medical record number: [id number] Date of birth: Mar 07, 1941 Age: 74 y.o. Gender: male  Primary Care Provider: [name], MD Consultants: None Code Status: Full  Pt Overview and Major Events to Date:  2/16 - 2/17 - Admitted for hypotension and AKI in the setting of recent diarrhea / dehydration.   Assessment and Plan: [name] is a 74 y.o. male presenting with worsening generalized weakness, lightheadedness/dizziness for past 3 days, suspected to be due dehydration with recent diarrhea. 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  # Hypotension / Generalized Weakness, in setting of known orthostatic hypotension - Improved - On admission, BP 64/36 improved rapidly s/p 1L bolus to SBP 90s. Suspected due to dehydration with recent diarrhea and presumed GI illness (+sick contact at home with similar diarrhea). Elevated Cr and BUN suggestive of pre-renal etiology, supports dehydration, mild clinical dehydration on exam. Additionally continued home diuretics with torsemide and poor recent PO intake. Known history of orthostatic hypotension (recently lowered anti-HTNs in clinic on 05/05/14). Unlikely infectious etiology, exam non-focal, WBC 13, CXR neg, UA neg, Lactic acid 2.3 > 0.9. CT Abd negative for acute cause of abd pain or diarrhea. >>> Hypotension resolved with IVF.  - Admit to FPTS, telemetry - telemetry monitoring, cont pulse ox, O2 PRN - BP's >> to baseline 119 / 45 - S/P 2.5L IVF. No MIVF. Taking good PO.  - SLIV and cautious with IVF given systolic CHF. May do 500cc bolus PRN if fluid status stable - Hold home anti-HTN meds - Orthostatics with pulse positive. 88 > 104. BP's 105/49 > 104 / 53 - PT/ OT  -  f/u AM labs BMET / CBC, TSH - Check C Diff PCR  # AKI, suspected due to dehydration - Baseline Cr about 1.1, elevated on admit to 1.80, elevated BUN suggestive of pre-renal etiology - Cr > 1.12 on discharge. AKI resolved.  - s/p 2.5L IVF boluses for hypotension - SLIV but may resume diet - Avoid nephrotoxic medications (hold home ACEi, Torsemide, no NSAIDs)  # Chronic Systolic CHF - Last ECHO 06/2593 with reduced EF 40-45% - Clinically appears mild dehydrated / dry on exam, wt 253 (down 2 lbs since last OV < 1 week ago), lungs clear, CXR with some vasc congestion but no edema. - S/p 2L IVF boluses - SLIV and cautious with IVF given systolic CHF. May do 500cc bolus PRN if fluid status stable - Hold home diuretic (Torsemide) - Strict I/Os, daily wts - Consider repeat ECHO  # Chronic H/o DVT/PE, on anticoagulation - Continue home coumadin (consult per pharm)  # Osteoarthritis, R-shoulder and Back - Stable chronic pain - Ordered Tylenol 650mg  q 6 hr scheduled (normally takes Tylenol 4 with codeine at home, holding) - ordered Tramadol 50mg  q 6 hr PRN  # Type 2 Diabetes - Last HgbA1c 8.7 (11/2013) - Check A1c in AM - continue home NPH insulin, reduced dose to 15u BID wc (from 35u BID) - SSI - Check CBG q 4 hr ac  # COPD - Stable. No wheezing on exam. - Monitor  # HTN - Hypotensive on admission - Holding home anti-HTN meds  # BPH - Hold home Flomax  # Chronic Anxiety / Depression, Chronic Pain - Stable mood. - Continue home Ativan 0.25mg  qhs - Continue  home Effexor 37.5 mg qhs  # GERD - continue daily PPI  FEN/GI: SLIV / Heart healthy / carb mod diet Prophylaxis: chronic anticoagulation with coumadin per pharm  Disposition: Home vs. SNF today.   Subjective:  Pt. Feeling better this morning, but still somewhat lightheaded with standing. He has not had any further nasuea or vomiting. He says that he is considering going home vs. Going to SNF. He will let us know what  he decides later this afternoon.   Objective: Temp:  [97.3 F (36.3 C)-97.9 F (36.6 C)] 97.3 F (36.3 C) (02/17 0300) Pulse Rate:  [72-109] 88 (02/17 0300) Resp:  [14-21] 18 (02/17 0300) BP: (48-110)/(0-67) 98/46 mmHg (02/16 2350) SpO2:  [90 %-98 %] 98 % (02/17 0300) Weight:  [253 lb 12 oz (115.1 kg)-254 lb 6.6 oz (115.4 kg)] 254 lb 6.6 oz (115.4 kg) (02/17 0300) Physical Exam: General: NAD, AAox3 Cardiovascular: RRR, No MGR, Normal S1/S2, 2+ distal pulses without peripheral edema Respiratory: CTA Bilaterally without crackles, rales, or wheezes, Appropriate rate, Unlabored.  Abdomen: S, NT, Nd, +BS Extremities: WWP, Changes consistent with poor blood flow to the periphery. No rashes, no lesions of the lower extremities, No ulcerations.  Skin: Cracked, dry, and discolored skin consistent with poor blood flow to the BL Lower extremities.  Neuro: AAOx3, No focal deficits.   Laboratory:  Recent Labs Lab 05/09/14 1631 05/09/14 1715 05/10/14 0525  WBC 13.0*  --  9.0  HGB 15.0 14.6 12.6*  HCT 44.7 43.0 39.0  PLT 207  --  147*    Recent Labs Lab 05/09/14 1631 05/09/14 1715 05/10/14 0525  NA 136 139 137  K 4.1 4.7 3.5  CL 102 100 106  CO2 23  --  28  BUN 24* 34* 19  CREATININE 1.93* 1.80* 1.12  CALCIUM 8.8  --  8.3*  PROT 6.7  --   --   BILITOT 0.6  --   --   ALKPHOS 70  --   --   ALT 20  --   --   AST 22  --   --   GLUCOSE 148* 139* 148*   Lactic Acid 2.34 >> 0.91  Troponin-I - negative, (Trop-poc, negative)  BNP - 102.4  UA - neg nitrite, neg leuks, neg hgb, rare bacteria, rare squam  2/16 CXR Portable 1v FINDINGS: Heart size and pulmonary vascularity are normal. There is slight interstitial accentuation at the left lung base with chronic slight elevation of the left hemidiaphragm. CABG.  Previous lower anterior cervical fusion. Severe arthritis of the right shoulder.  IMPRESSION: No acute abnormalities  2/16 CT Abd / Pelvis IMPRESSION: Stable  fat containing mass at inferior pole RIGHT kidney consistent with an angiomyolipoma.  Extensive atherosclerotic disease.  Sigmoid diverticulosis without evidence of diverticulitis.  Unable to exclude gastric wall thickening.  RIGHT inguinal hernia containing fat.   Aquilla Hacker, MD 05/10/2014, 9:17 AM PGY-1, [place] Intern pager: [phone], text pages welcome

## 2014-05-11 DIAGNOSIS — R197 Diarrhea, unspecified: Secondary | ICD-10-CM | POA: Diagnosis not present

## 2014-05-11 DIAGNOSIS — J449 Chronic obstructive pulmonary disease, unspecified: Secondary | ICD-10-CM | POA: Diagnosis not present

## 2014-05-11 DIAGNOSIS — I10 Essential (primary) hypertension: Secondary | ICD-10-CM | POA: Diagnosis not present

## 2014-05-11 DIAGNOSIS — R42 Dizziness and giddiness: Secondary | ICD-10-CM | POA: Diagnosis not present

## 2014-05-11 DIAGNOSIS — I952 Hypotension due to drugs: Secondary | ICD-10-CM | POA: Diagnosis not present

## 2014-05-11 DIAGNOSIS — M19011 Primary osteoarthritis, right shoulder: Secondary | ICD-10-CM | POA: Diagnosis not present

## 2014-05-11 DIAGNOSIS — Z86711 Personal history of pulmonary embolism: Secondary | ICD-10-CM | POA: Diagnosis not present

## 2014-05-11 DIAGNOSIS — I5022 Chronic systolic (congestive) heart failure: Secondary | ICD-10-CM | POA: Diagnosis not present

## 2014-05-11 DIAGNOSIS — R488 Other symbolic dysfunctions: Secondary | ICD-10-CM | POA: Diagnosis not present

## 2014-05-11 DIAGNOSIS — Z9181 History of falling: Secondary | ICD-10-CM | POA: Diagnosis not present

## 2014-05-11 DIAGNOSIS — E860 Dehydration: Secondary | ICD-10-CM | POA: Diagnosis not present

## 2014-05-11 DIAGNOSIS — M159 Polyosteoarthritis, unspecified: Secondary | ICD-10-CM | POA: Diagnosis not present

## 2014-05-11 DIAGNOSIS — R531 Weakness: Secondary | ICD-10-CM | POA: Diagnosis not present

## 2014-05-11 DIAGNOSIS — E118 Type 2 diabetes mellitus with unspecified complications: Secondary | ICD-10-CM | POA: Diagnosis not present

## 2014-05-11 DIAGNOSIS — W19XXXS Unspecified fall, sequela: Secondary | ICD-10-CM | POA: Diagnosis not present

## 2014-05-11 DIAGNOSIS — M6281 Muscle weakness (generalized): Secondary | ICD-10-CM | POA: Diagnosis not present

## 2014-05-11 DIAGNOSIS — R2681 Unsteadiness on feet: Secondary | ICD-10-CM | POA: Diagnosis not present

## 2014-05-11 DIAGNOSIS — I9589 Other hypotension: Secondary | ICD-10-CM | POA: Diagnosis not present

## 2014-05-11 DIAGNOSIS — N179 Acute kidney failure, unspecified: Secondary | ICD-10-CM | POA: Diagnosis not present

## 2014-05-11 DIAGNOSIS — R1312 Dysphagia, oropharyngeal phase: Secondary | ICD-10-CM | POA: Diagnosis not present

## 2014-05-11 DIAGNOSIS — I951 Orthostatic hypotension: Secondary | ICD-10-CM | POA: Diagnosis not present

## 2014-05-11 DIAGNOSIS — Z7901 Long term (current) use of anticoagulants: Secondary | ICD-10-CM | POA: Diagnosis not present

## 2014-05-11 DIAGNOSIS — H8113 Benign paroxysmal vertigo, bilateral: Secondary | ICD-10-CM | POA: Diagnosis not present

## 2014-05-11 LAB — GLUCOSE, CAPILLARY
GLUCOSE-CAPILLARY: 133 mg/dL — AB (ref 70–99)
GLUCOSE-CAPILLARY: 138 mg/dL — AB (ref 70–99)

## 2014-05-11 LAB — BASIC METABOLIC PANEL
Anion gap: 7 (ref 5–15)
BUN: 14 mg/dL (ref 6–23)
CO2: 24 mmol/L (ref 19–32)
Calcium: 9 mg/dL (ref 8.4–10.5)
Chloride: 108 mmol/L (ref 96–112)
Creatinine, Ser: 0.94 mg/dL (ref 0.50–1.35)
GFR calc Af Amer: >90 mL/min (ref >90)
GFR calc non Af Amer: 80 mL/min — ABNORMAL LOW (ref >90)
GLUCOSE: 133 mg/dL — AB (ref 70–99)
POTASSIUM: 4.2 mmol/L (ref 3.5–5.1)
Sodium: 139 mmol/L (ref 135–145)

## 2014-05-11 LAB — CBC WITH DIFFERENTIAL/PLATELET
BASOS ABS: 0 10*3/uL (ref 0.0–0.1)
Basophils Relative: 0 % (ref 0–1)
EOS PCT: 3 % (ref 0–5)
Eosinophils Absolute: 0.2 10*3/uL (ref 0.0–0.7)
HCT: 42.1 % (ref 39.0–52.0)
Hemoglobin: 13.7 g/dL (ref 13.0–17.0)
LYMPHS PCT: 20 % (ref 12–46)
Lymphs Abs: 1.8 10*3/uL (ref 0.7–4.0)
MCH: 26.6 pg (ref 26.0–34.0)
MCHC: 32.5 g/dL (ref 30.0–36.0)
MCV: 81.6 fL (ref 78.0–100.0)
Monocytes Absolute: 0.9 10*3/uL (ref 0.1–1.0)
Monocytes Relative: 10 % (ref 3–12)
NEUTROS ABS: 6.1 10*3/uL (ref 1.7–7.7)
Neutrophils Relative %: 67 % (ref 43–77)
Platelets: 163 10*3/uL (ref 150–400)
RBC: 5.16 MIL/uL (ref 4.22–5.81)
RDW: 16.9 % — ABNORMAL HIGH (ref 11.5–15.5)
WBC: 9.2 10*3/uL (ref 4.0–10.5)

## 2014-05-11 LAB — PROTIME-INR
INR: 2.13 — AB (ref 0.00–1.49)
PROTHROMBIN TIME: 24 s — AB (ref 11.6–15.2)

## 2014-05-11 LAB — HEMOGLOBIN A1C
HEMOGLOBIN A1C: 7.4 % — AB (ref 4.8–5.6)
MEAN PLASMA GLUCOSE: 166 mg/dL

## 2014-05-11 MED ORDER — SODIUM CHLORIDE 0.9 % IJ SOLN
3.0000 mL | Freq: Two times a day (BID) | INTRAMUSCULAR | Status: DC
  Administered 2014-05-11: 3 mL via INTRAVENOUS

## 2014-05-11 MED ORDER — LORAZEPAM 0.5 MG PO TABS: 0.2500 mg | ORAL_TABLET | Freq: Every day | ORAL | Status: DC

## 2014-05-11 MED ORDER — SODIUM CHLORIDE 0.9 % IJ SOLN: 3.0000 mL | INTRAMUSCULAR | Status: DC | PRN

## 2014-05-11 MED ORDER — ATORVASTATIN CALCIUM 40 MG PO TABS: 40.0000 mg | ORAL_TABLET | Freq: Every day | ORAL | Status: DC

## 2014-05-11 NOTE — Care Management Note (Signed)
    Page 1 of 1   05/11/2014     5:15:07 PM CARE MANAGEMENT NOTE 05/11/2014  Patient:  [name], [name]   Account Number:  [id number]  Date Initiated:  05/11/2014  Documentation initiated by:  Marvetta Gibbons  Subjective/Objective Assessment:   Pt admitted with AKI     Action/Plan:   PTA pt lived at home, active with [facility] for HH-RN/PT- d/c plan for SNF   Anticipated DC Date:  05/11/2014   Anticipated DC Plan:  SKILLED NURSING FACILITY  In-house referral  Clinical Social Worker         Choice offered to / List presented to:             Status of service:  Completed, signed off Medicare Important Message given?  NA - LOS <3 / Initial given by admissions (If response is "NO", the following Medicare IM given date fields will be blank) Date Medicare IM given:   Medicare IM given by:   Date Additional Medicare IM given:   Additional Medicare IM given by:    Discharge Disposition:  [place]  Per UR Regulation:  Reviewed for med. necessity/level of care/duration of stay  If discussed at [place] of Stay Meetings, dates discussed:    Comments:

## 2014-05-11 NOTE — Progress Notes (Signed)
ANTICOAGULATION CONSULT NOTE - Follow Up Consult  Pharmacy Consult for coumadin Indication: hx PE  Allergies  Allergen Reactions  . Vancomycin Other (See Comments)    "red man syndrome"  . Diazepam Anxiety    REACTION: makes patient cry    Patient Measurements: Height: 5\' 8"  (172.7 cm) Weight: 247 lb 12.8 oz (112.4 kg) IBW/kg (Calculated) : 68.4   Vital Signs: Temp: 98 F (36.7 C) (02/18 0440) Temp Source: Oral (02/18 0440) BP: 129/61 mmHg (02/18 0440) Pulse Rate: 70 (02/18 0440)  Labs:  Recent Labs  05/09/14 1631 05/09/14 1715 05/10/14 0525 05/10/14 1152 05/11/14 0500  HGB 15.0 14.6 12.6*  --  13.7  HCT 44.7 43.0 39.0  --  42.1  PLT 207  --  147*  --  163  LABPROT 26.4*  --   --  26.0* 24.0*  INR 2.40*  --   --  2.36* 2.13*  CREATININE 1.93* 1.80* 1.12  --  0.94  TROPONINI <0.03  --   --   --   --     Estimated Creatinine Clearance: 82.6 mL/min (by C-G formula based on Cr of 0.94).  Assessment: Patient is a 74 y.o M on coumadin for hx DVT with plan to d/c to SNF today.  INR is at goal with 2.13.  Goal of Therapy:  INR 2-3    Plan:  - continue home coumadin dose of 5mg  daily - f/u with patient in AM if he's not discharged to SNF today  Esgar Barnick P 05/11/2014,1:16 PM

## 2014-05-11 NOTE — Clinical Social Work Note (Signed)
Patient to be d/c'ed today to [facility].  Patient and family agreeable to plans will transport via ems RN to call report.  Patient expressed being nervous about going to a SNF, CSW explained to him what would happen while he is there for short term rehab, and he said he's okay now.  Evette Cristal, MSW, [place]

## 2014-05-12 ENCOUNTER — Non-Acute Institutional Stay (SKILLED_NURSING_FACILITY): Payer: Medicare Other | Admitting: Internal Medicine

## 2014-05-12 ENCOUNTER — Encounter: Payer: Self-pay | Admitting: Internal Medicine

## 2014-05-12 DIAGNOSIS — M1990 Unspecified osteoarthritis, unspecified site: Secondary | ICD-10-CM | POA: Insufficient documentation

## 2014-05-12 DIAGNOSIS — Z86711 Personal history of pulmonary embolism: Secondary | ICD-10-CM | POA: Diagnosis not present

## 2014-05-12 DIAGNOSIS — I10 Essential (primary) hypertension: Secondary | ICD-10-CM

## 2014-05-12 DIAGNOSIS — M19011 Primary osteoarthritis, right shoulder: Secondary | ICD-10-CM

## 2014-05-12 DIAGNOSIS — Z7901 Long term (current) use of anticoagulants: Secondary | ICD-10-CM

## 2014-05-12 DIAGNOSIS — E118 Type 2 diabetes mellitus with unspecified complications: Secondary | ICD-10-CM

## 2014-05-12 DIAGNOSIS — F32A Depression, unspecified: Secondary | ICD-10-CM

## 2014-05-12 DIAGNOSIS — F329 Major depressive disorder, single episode, unspecified: Secondary | ICD-10-CM

## 2014-05-12 DIAGNOSIS — N179 Acute kidney failure, unspecified: Secondary | ICD-10-CM

## 2014-05-12 DIAGNOSIS — F411 Generalized anxiety disorder: Secondary | ICD-10-CM

## 2014-05-12 DIAGNOSIS — I5022 Chronic systolic (congestive) heart failure: Secondary | ICD-10-CM

## 2014-05-12 DIAGNOSIS — N400 Enlarged prostate without lower urinary tract symptoms: Secondary | ICD-10-CM

## 2014-05-12 DIAGNOSIS — I70209 Unspecified atherosclerosis of native arteries of extremities, unspecified extremity: Secondary | ICD-10-CM

## 2014-05-12 DIAGNOSIS — E1151 Type 2 diabetes mellitus with diabetic peripheral angiopathy without gangrene: Secondary | ICD-10-CM | POA: Insufficient documentation

## 2014-05-12 NOTE — Progress Notes (Signed)
MRN: [id number] Name: [name]  Sex: male Age: 74 y.o. DOB: 1940/06/15  [place] #: [facility] Facility/Room: 112 Level Of Care: SNF Provider: Inocencio Homes D Emergency Contacts: Extended Emergency Contact Information Primary Emergency Contact: [place], Swor Address: [address] [place], [place] Mobile Phone: [phone] Relation: Spouse  Code Status: FULL  Allergies: Vancomycin and Diazepam  Chief Complaint  Patient presents with  . New Admit To SNF    HPI: Patient is 74 y.o. male who is admitted for generalized weakness for OT/PT after being hospitalized for hypotension and pre-renal azotemia.  Past Medical History  Diagnosis Date  . Depression   . Diabetes mellitus   . COPD (chronic obstructive pulmonary disease)   . Adhesive capsulitis   . MRSA bacteremia     2011 - possible endocarditis, received 6 weeks IV treatment  . Hyperlipidemia   . Anxiety   . GERD (gastroesophageal reflux disease)   . CAD (coronary artery disease)   . 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 CVA (cerebral infarction) Questionable history  . Diverticulosis   . Osteomyelitis 2012    Sternoclavicular joint   . Chronic back pain   . Angina   . Myocardial infarction   . CHF (congestive heart failure)   . Shortness of breath   . Sleep apnea   . Chronic kidney disease     hx of BPH  . Neuromuscular disorder     HX of diabetic periferal neuropathy  . Hypertension   . Peripheral vascular disease   . On home oxygen therapy     "1.5L prn" (04/12/2014)    Past Surgical History  Procedure Laterality Date  . Septic arthritis      Removal of infected CABG wire by Dr Arlyce Dice - 2011  . Coronary artery bypass graft  1992  . Fracture surgery  1980s    Hip  . Spine surgery  2002    Cervical fusion vertebroplasty C3-4-5  . Coronary stent placement  1999, 2002  . Doppler echocardiography  Sept 2011    EF 50-55% with some  impaired diastolic relaxation  . Colonoscopy  2006     Multiple polyps removed, repeat in 5 years  . Right iliopopliteal bypass - [place]  . Femoral-popliteal bypass graft  2008    Left  . Cardiac catheterization  October 18, 2010    Known obstructive disease, no further blockages  . Cardiac catheterization  03/17/2005    EF 35-40%  . US echocardiography  03/04/2006    EF 50-55%  . Amputation  03/16/2011    Procedure: AMPUTATION DIGIT;  Surgeon: Angelia Mould, MD;  Location: [facility] OR;  Service: Vascular;  Laterality: Left;  Great toe      Medication List       This list is accurate as of: 05/12/14  8:45 PM.  Always use your most recent med list.               acetaminophen-codeine 300-60 MG per tablet  Commonly known as:  TYLENOL #4  Take 1 tablet by mouth every 8 (eight) hours as needed for moderate pain.     atorvastatin 40 MG tablet  Commonly known as:  LIPITOR  Take 1 tablet (40 mg total) by mouth daily at 6 PM.     bisoprolol 5 MG tablet  Commonly known as:  ZEBETA  Take 0.5 tablets (2.5 mg total) by mouth daily.     glucose blood test strip  Use as instructed.  One Step Ultra 2 strips.     insulin NPH Human 100 UNIT/ML injection  Commonly known as:  HUMULIN N,NOVOLIN N  Inject 0.35 mLs (35 Units total) into the skin 2 (two) times daily before a meal.     Insulin Syringe-Needle U-100 30G X 1/2" 0.5 ML Misc  Commonly known as:  B-D INS SYRINGE 0.5CC/30GX1/2"  For use with insulin     lidocaine 5 %  Commonly known as:  LIDODERM  Place 1 patch onto the skin daily. Remove & Discard patch within 12 hours or as directed by MD     lisinopril 5 MG tablet  Commonly known as:  PRINIVIL,ZESTRIL  Take 1 tablet (5 mg total) by mouth daily.     LORazepam 0.5 MG tablet  Commonly known as:  ATIVAN  Take 0.5 tablets (0.25 mg total) by mouth at bedtime.     meclizine 25 MG tablet  Commonly known as:  ANTIVERT  TAKE ONE TABLET BY MOUTH THREE TIMES DAILY AS NEEDED  FOR  DIZZINESS     NEEDLE (DISP) 30 G 30G X 1" Misc  Commonly known as:  B-D DISP NEEDLE 30GX1"  For using with insulin.     nitroGLYCERIN 0.4 MG SL tablet  Commonly known as:  NITROSTAT  Place 1 tablet (0.4 mg total) under the tongue as needed. For chest pains     omeprazole 40 MG capsule  Commonly known as:  PRILOSEC  Take 1 capsule (40 mg total) by mouth daily.     ONE TOUCH ULTRA 2 W/DEVICE Kit  Please supply patient with 1 glucometer device to use 4 times daily to check blood sugars.  Dx code: 250.0     tamsulosin 0.4 MG Caps capsule  Commonly known as:  FLOMAX  Take 1 capsule (0.4 mg total) by mouth daily after supper.     torsemide 20 MG tablet  Commonly known as:  DEMADEX  TAKE ONE TABLET BY MOUTH EVERY DAY     traMADol 50 MG tablet  Commonly known as:  ULTRAM  TAKE ONE TO TWO TABLETS BY MOUTH EVERY 8 HOURS AS NEEDED     TYLENOL ARTHRITIS PAIN PO  Take 1-2 tablets by mouth 2 (two) times daily as needed (for arthritis pain).     venlafaxine XR 37.5 MG 24 hr capsule  Commonly known as:  EFFEXOR-XR  TAKE ONE CAPSULE BY MOUTH ONCE DAILY     warfarin 5 MG tablet  Commonly known as:  COUMADIN  Take 5 mg by mouth daily at 6 PM.        No orders of the defined types were placed in this encounter.    Immunization History  Administered Date(s) Administered  . Influenza Split 12/12/2010  . Influenza Whole 12/22/2005, 12/14/2007  . Influenza,inj,Quad PF,36+ Mos 01/20/2013, 12/09/2013  . Pneumococcal Conjugate-13 10/10/2013  . Pneumococcal Polysaccharide-23 11/23/1999, 03/25/2003, 05/25/2011  . Td 10/23/2003    History  Substance Use Topics  . Smoking status: Former Smoker    Quit date: 06/19/2000  . Smokeless tobacco: Never Used  . Alcohol Use: No    Family history is noncontributory    Review of Systems  DATA OBTAINED: from patient, nurse GENERAL:  no fevers, fatigue, appetite changes SKIN: No itching, rash or wounds EYES: No eye pain, redness,  discharge EARS: No earache, tinnitus, change in hearing NOSE: No congestion,  drainage or bleeding  MOUTH/THROAT: No mouth or tooth pain, No sore throat RESPIRATORY: No cough, wheezing, SOB CARDIAC: No chest pain, palpitations, lower extremity edema  GI: No abdominal pain, No N/V/D or constipation, No heartburn or reflux  GU: No dysuria, frequency or urgency, or incontinence  MUSCULOSKELETAL: No unrelieved bone/joint pain NEUROLOGIC: No headache, dizziness or focal weakness PSYCHIATRIC: No overt anxiety or sadness, No behavior issue.   There were no vitals filed for this visit.  Physical Exam  GENERAL APPEARANCE: Alert, conversant,  No acute distress.  SKIN: No diaphoresis rash HEAD: Normocephalic, atraumatic  EYES: Conjunctiva/lids clear. Pupils round, reactive. EOMs intact.  EARS: External exam WNL, canals clear. Hearing grossly normal.  NOSE: No deformity or discharge.  MOUTH/THROAT: Lips w/o lesions  RESPIRATORY: Breathing is even, unlabored. Lung sounds are clear   CARDIOVASCULAR: Heart RRR no murmurs, rubs or gallops. trace peripheral edema.   GASTROINTESTINAL: Abdomen is soft, non-tender, not distended w/ normal bowel sounds. GENITOURINARY: Bladder non tender, not distended  MUSCULOSKELETAL: No abnormal joints or musculature NEUROLOGIC:  Cranial nerves 2-12 grossly intact. Moves all extremities  PSYCHIATRIC: Mood and affect appropriate to situation, no behavioral issues  Patient Active Problem List   Diagnosis Date Noted  . OA (osteoarthritis) 05/12/2014  . Personal history of PE (pulmonary embolism) 05/12/2014  . DM type 2, controlled, with complication 82/95/6213  . Dehydration   . Hypotension 05/10/2014  . Prerenal acute renal failure 05/09/2014  . Altered mental status   . Delirium 04/12/2014  . Unsteady gait 04/12/2014  . Orthostatic hypotension 05/16/2013  . Generalized weakness 04/30/2013  . Left hip pain 12/10/2012  . At high risk for falls 07/19/2012  .  Diarrhea 04/06/2012  . Vitamin D deficiency 03/12/2012  . Falls 02/17/2012  . Bradycardia 01/22/2012  . Hemorrhoid 01/12/2012  . GERD (gastroesophageal reflux disease) 11/10/2011  . Knee pain, bilateral 10/14/2011  . Leg edema 09/20/2011  . Fatigue 07/04/2011  . Peripheral vascular disease, unspecified 04/16/2011  . DM (diabetes mellitus), type 2, uncontrolled 04/16/2011  . Vancomycin adverse reaction 03/13/2011  . COPD (chronic obstructive pulmonary disease) 02/27/2011  . Stasis dermatitis 12/04/2010  . BPH (benign prostatic hyperplasia) 11/01/2010  . Long term current use of anticoagulant therapy 05/03/2010  . FROZEN RIGHT SHOULDER 02/25/2010  . DIABETIC PERIPHERAL NEUROPATHY 12/20/2009  . Depression 07/08/2006  . Essential hypertension 07/08/2006  . HYPERCHOLESTEROLEMIA 05/21/2006  . Anxiety state 05/21/2006  . CORONARY, ARTERIOSCLEROSIS 05/21/2006  . Chronic systolic heart failure 08/65/7846  . REFLUX ESOPHAGITIS 05/21/2006  . APNEA, SLEEP 05/21/2006    CBC    Component Value Date/Time   WBC 9.2 05/11/2014 0500   RBC 5.16 05/11/2014 0500   HGB 13.7 05/11/2014 0500   HCT 42.1 05/11/2014 0500   PLT 163 05/11/2014 0500   MCV 81.6 05/11/2014 0500   LYMPHSABS 1.8 05/11/2014 0500   MONOABS 0.9 05/11/2014 0500   EOSABS 0.2 05/11/2014 0500   BASOSABS 0.0 05/11/2014 0500    CMP     Component Value Date/Time   NA 139 05/11/2014 0500   K 4.2 05/11/2014 0500   CL 108 05/11/2014 0500   CO2 24 05/11/2014 0500   GLUCOSE 133* 05/11/2014 0500   BUN 14 05/11/2014 0500   CREATININE 0.94 05/11/2014 0500   CREATININE 1.11 06/14/2013 1221   CALCIUM 9.0 05/11/2014 0500   PROT 6.7 05/09/2014 1631   ALBUMIN 3.9 05/09/2014 1631   AST 22 05/09/2014 1631   ALT 20 05/09/2014 1631   ALKPHOS 70 05/09/2014  1631   BILITOT 0.6 05/09/2014 1631   GFRNONAA 80* 05/11/2014 0500   GFRAA >90 05/11/2014 0500    Assessment and Plan  Hypotension known orthostatic hypotension - Pt. Was  admitted with blood pressures to 64/36 on admission in the ED. He was symptomatic with dizziness and weakness in the ED as well. Upon chart review, he does have some light headedness with known orthostasis at baseline. However, his current low blood pressures were in the setting of recent diarrhea that has since resolved likely contributing to even lower blood pressures. He was given gentle IV fluid rehydration during his admission given his history of CHF. His blood pressure medications were also held during this admission. Orthostatic blood pressures were positive by pulse with pulse going from 88 to 112. His blood pressures responded nicely to IV fluid rehydration, and he was found to be safe and stable for discharge to SNF for physical therapy and outpatient rehabilitation.    Prerenal acute renal failure - Pt. Admitted with recent history of diarrhea that has since resolved. He was found to have an elevated creatinine to 1.8 with a baseline around 1.1. His AKI was found to be prerenal in etiology. He was started on IV fluids due to the above problem. His creatinine improved to 0.94 two days after admission, and by discharge his AKI was resolved.    Chronic systolic heart failure - Echo 04/2013 with EF 40-45%. Given gentle IV fluids for above problems, but without evidence of peripheral edema or pulmonary findings consistent with fluid overload on exam. Home Torsemide was held during this hospitalization and will be resumed at discharge.    OA (osteoarthritis) R shoulder and back- Scheduled Tylenol 660m q6hrs. Here with Tramadol 589mq6hrs as needed for break through pain. Pain well controlled here. On Tylenol 4 with codeine at home for pain control   Long term current use of anticoagulant therapy Hx PE;continue coumadin   Personal history of PE (pulmonary embolism) Pt on chronic coumadin   DM type 2, controlled, with complication - Pt. With Hgb A1C of 8.7 in 11/2013. A1C was ordered  here, and was 7.4. He was continued on his home NPH insulin here however with a reduced dose to 15 units BID wc here from 35 units BID. Sliding Scale Insulin was ordered for breakthrough glucose control   Essential hypertension On lisinopril, Demadex, and Zebeta. These were held on admission due to hypotension. Continue to titrate to tolerance as an outpatient   BPH (benign prostatic hyperplasia) Continue flomax   Depression Continue effexor   Anxiety state Continue lorazepam     ALHennie DuosMD

## 2014-05-12 NOTE — Assessment & Plan Note (Signed)
On lisinopril, Demadex, and Zebeta. These were held on admission due to hypotension. Continue to titrate to tolerance as an outpatient

## 2014-05-12 NOTE — Assessment & Plan Note (Signed)
R shoulder and back- Scheduled Tylenol 650mg  q6hrs. Here with Tramadol 50mg  q6hrs as needed for break through pain. Pain well controlled here. On Tylenol 4 with codeine at home for pain control

## 2014-05-12 NOTE — Assessment & Plan Note (Signed)
Continue flomax  

## 2014-05-12 NOTE — Assessment & Plan Note (Signed)
Pt on chronic coumadin

## 2014-05-12 NOTE — Assessment & Plan Note (Signed)
known orthostatic hypotension - Pt. Was admitted with blood pressures to 64/36 on admission in the ED. He was symptomatic with dizziness and weakness in the ED as well. Upon chart review, he does have some light headedness with known orthostasis at baseline. However, his current low blood pressures were in the setting of recent diarrhea that has since resolved likely contributing to even lower blood pressures. He was given gentle IV fluid rehydration during his admission given his history of CHF. His blood pressure medications were also held during this admission. Orthostatic blood pressures were positive by pulse with pulse going from 88 to 112. His blood pressures responded nicely to IV fluid rehydration, and he was found to be safe and stable for discharge to SNF for physical therapy and outpatient rehabilitation.

## 2014-05-12 NOTE — Assessment & Plan Note (Signed)
Continue lorazepam 

## 2014-05-12 NOTE — Assessment & Plan Note (Signed)
-   Pt. Admitted with recent history of diarrhea that has since resolved. He was found to have an elevated creatinine to 1.8 with a baseline around 1.1. His AKI was found to be prerenal in etiology. He was started on IV fluids due to the above problem. His creatinine improved to 0.94 two days after admission, and by discharge his AKI was resolved.

## 2014-05-12 NOTE — Assessment & Plan Note (Signed)
Hx PE;continue coumadin

## 2014-05-12 NOTE — Assessment & Plan Note (Signed)
Continue effexor  

## 2014-05-12 NOTE — Assessment & Plan Note (Signed)
-   Echo 04/2013 with EF 40-45%. Given gentle IV fluids for above problems, but without evidence of peripheral edema or pulmonary findings consistent with fluid overload on exam. Home Torsemide was held during this hospitalization and will be resumed at discharge.

## 2014-05-12 NOTE — Assessment & Plan Note (Signed)
-   Pt. With Hgb A1C of 8.7 in 11/2013. A1C was ordered here, and was 7.4. He was continued on his home NPH insulin here however with a reduced dose to 15 units BID wc here from 35 units BID. Sliding Scale Insulin was ordered for breakthrough glucose control

## 2014-05-15 LAB — CULTURE, BLOOD (ROUTINE X 2)
CULTURE: NO GROWTH
Culture: NO GROWTH

## 2014-05-16 ENCOUNTER — Ambulatory Visit: Payer: Medicare Other | Admitting: Family Medicine

## 2014-05-22 ENCOUNTER — Ambulatory Visit: Payer: Medicare Other

## 2014-05-26 ENCOUNTER — Non-Acute Institutional Stay (SKILLED_NURSING_FACILITY): Payer: Medicare Other | Admitting: Internal Medicine

## 2014-05-26 ENCOUNTER — Encounter: Payer: Self-pay | Admitting: Internal Medicine

## 2014-05-26 DIAGNOSIS — I5022 Chronic systolic (congestive) heart failure: Secondary | ICD-10-CM | POA: Diagnosis not present

## 2014-05-26 DIAGNOSIS — H8110 Benign paroxysmal vertigo, unspecified ear: Secondary | ICD-10-CM | POA: Insufficient documentation

## 2014-05-26 DIAGNOSIS — I951 Orthostatic hypotension: Secondary | ICD-10-CM

## 2014-05-26 DIAGNOSIS — H8113 Benign paroxysmal vertigo, bilateral: Secondary | ICD-10-CM

## 2014-05-26 DIAGNOSIS — I952 Hypotension due to drugs: Secondary | ICD-10-CM

## 2014-05-26 NOTE — Progress Notes (Signed)
MRN: [id number] Name: [name]  Sex: male Age: 74 y.o. DOB: Jan 16, 1941  [facility] #: [facility] Facility/Room:112 Level Of Care: SNF Provider: Inocencio Homes D Emergency Contacts: Extended Emergency Contact Information Primary Emergency Contact: Orson, Rho Address: [place], [place] Mobile Phone: [phone] Relation: Spouse  Code Status: FULL  Allergies: Vancomycin and Diazepam  Chief Complaint  Patient presents with  . Acute Visit    HPI: Patient is 74 y.o. male with CHF who the weekend nurse reports to be hypotensive at times.  Past Medical History  Diagnosis Date  . Depression   . Diabetes mellitus   . COPD (chronic obstructive pulmonary disease)   . Adhesive capsulitis   . MRSA bacteremia     2011 - possible endocarditis, received 6 weeks IV treatment  . Hyperlipidemia   . Anxiety   . GERD (gastroesophageal reflux disease)   . CAD (coronary artery disease)   . 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 CVA (cerebral infarction) Questionable history  . Diverticulosis   . Osteomyelitis 2012    Sternoclavicular joint   . Chronic back pain   . Angina   . Myocardial infarction   . CHF (congestive heart failure)   . Shortness of breath   . Sleep apnea   . Chronic kidney disease     hx of BPH  . Neuromuscular disorder     HX of diabetic periferal neuropathy  . Hypertension   . Peripheral vascular disease   . On home oxygen therapy     "1.5L prn" (04/12/2014)    Past Surgical History  Procedure Laterality Date  . Septic arthritis      Removal of infected CABG wire by Dr Arlyce Dice - 2011  . Coronary artery bypass graft  1992  . Fracture surgery  1980s    Hip  . Spine surgery  2002    Cervical fusion vertebroplasty C3-4-5  . Coronary stent placement  1999, 2002  . Doppler echocardiography  Sept 2011    EF 50-55% with some impaired diastolic relaxation  . Colonoscopy  2006      Multiple polyps removed, repeat in 5 years  . Right iliopopliteal bypass - [place]  . Femoral-popliteal bypass graft  2008    Left  . Cardiac catheterization  October 18, 2010    Known obstructive disease, no further blockages  . Cardiac catheterization  03/17/2005    EF 35-40%  . US echocardiography  03/04/2006    EF 50-55%  . Amputation  03/16/2011    Procedure: AMPUTATION DIGIT;  Surgeon: Angelia Mould, MD;  Location: [facility] OR;  Service: Vascular;  Laterality: Left;  Great toe      Medication List       This list is accurate as of: 05/26/14  2:44 PM.  Always use your most recent med list.               acetaminophen-codeine 300-60 MG per tablet  Commonly known as:  TYLENOL #4  Take 1 tablet by mouth every 8 (eight) hours as needed for moderate pain.     atorvastatin 40 MG tablet  Commonly known as:  LIPITOR  Take 1 tablet (40 mg total) by mouth daily at 6 PM.     bisoprolol 5 MG tablet  Commonly known as:  ZEBETA  Take 0.5 tablets (2.5 mg total) by  mouth daily.     glucose blood test strip  Use as instructed.  One Step Ultra 2 strips.     insulin NPH Human 100 UNIT/ML injection  Commonly known as:  HUMULIN N,NOVOLIN N  Inject 0.35 mLs (35 Units total) into the skin 2 (two) times daily before a meal.     Insulin Syringe-Needle U-100 30G X 1/2" 0.5 ML Misc  Commonly known as:  B-D INS SYRINGE 0.5CC/30GX1/2"  For use with insulin     lidocaine 5 %  Commonly known as:  LIDODERM  Place 1 patch onto the skin daily. Remove & Discard patch within 12 hours or as directed by MD     lisinopril 5 MG tablet  Commonly known as:  PRINIVIL,ZESTRIL  Take 1 tablet (5 mg total) by mouth daily.     LORazepam 0.5 MG tablet  Commonly known as:  ATIVAN  Take 0.5 tablets (0.25 mg total) by mouth at bedtime.     meclizine 25 MG tablet  Commonly known as:  ANTIVERT  TAKE ONE TABLET BY MOUTH THREE TIMES DAILY AS NEEDED FOR  DIZZINESS     NEEDLE (DISP) 30 G 30G X 1" Misc   Commonly known as:  B-D DISP NEEDLE 30GX1"  For using with insulin.     nitroGLYCERIN 0.4 MG SL tablet  Commonly known as:  NITROSTAT  Place 1 tablet (0.4 mg total) under the tongue as needed. For chest pains     omeprazole 40 MG capsule  Commonly known as:  PRILOSEC  Take 1 capsule (40 mg total) by mouth daily.     ONE TOUCH ULTRA 2 W/DEVICE Kit  Please supply patient with 1 glucometer device to use 4 times daily to check blood sugars.  Dx code: 250.0     tamsulosin 0.4 MG Caps capsule  Commonly known as:  FLOMAX  Take 1 capsule (0.4 mg total) by mouth daily after supper.     torsemide 20 MG tablet  Commonly known as:  DEMADEX  TAKE ONE TABLET BY MOUTH EVERY DAY     traMADol 50 MG tablet  Commonly known as:  ULTRAM  TAKE ONE TO TWO TABLETS BY MOUTH EVERY 8 HOURS AS NEEDED     TYLENOL ARTHRITIS PAIN PO  Take 1-2 tablets by mouth 2 (two) times daily as needed (for arthritis pain).     venlafaxine XR 37.5 MG 24 hr capsule  Commonly known as:  EFFEXOR-XR  TAKE ONE CAPSULE BY MOUTH ONCE DAILY     warfarin 5 MG tablet  Commonly known as:  COUMADIN  Take 5 mg by mouth daily at 6 PM.        No orders of the defined types were placed in this encounter.    Immunization History  Administered Date(s) Administered  . Influenza Split 12/12/2010  . Influenza Whole 12/22/2005, 12/14/2007  . Influenza,inj,Quad PF,36+ Mos 01/20/2013, 12/09/2013  . Pneumococcal Conjugate-13 10/10/2013  . Pneumococcal Polysaccharide-23 11/23/1999, 03/25/2003, 05/25/2011  . Td 10/23/2003    History  Substance Use Topics  . Smoking status: Former Smoker    Quit date: 06/19/2000  . Smokeless tobacco: Never Used  . Alcohol Use: No    Review of Systems  DATA OBTAINED: from patient, nurse GENERAL:  no fevers, fatigue, appetite changes SKIN: No itching, rash HEENT: No complaint RESPIRATORY: No cough, wheezing, SOB CARDIAC: No chest pain, palpitations, lower extremity edema;WE nurse report  BP 79/44 after going to [facility] and having 2 BM; BP was improved lying  down; weekday nurse does not report BP that low on her shift GI: No abdominal pain, No N/V/D or constipation, No heartburn or reflux  GU: No dysuria, frequency or urgency, or incontinence  MUSCULOSKELETAL: No unrelieved bone/joint pain NEUROLOGIC: No headache,sometimes dizziness for which pt gets antivert  PSYCHIATRIC: No overt anxiety or sadness  Filed Vitals:   05/26/14 1318  BP: 103/47  Pulse: 67  Temp: 97.1 F (36.2 C)  Resp: 18    Physical Exam  GENERAL APPEARANCE: Alert, conversant, No acute distress  SKIN: No diaphoresis rash, or wounds HEENT: Unremarkable RESPIRATORY: Breathing is even, unlabored. Lung sounds are clear   CARDIOVASCULAR: Heart RRR no murmurs, rubs or gallops. trace peripheral edema, improved  GASTROINTESTINAL: Abdomen is soft, non-tender, not distended w/ normal bowel sounds.  GENITOURINARY: Bladder non tender, not distended  MUSCULOSKELETAL: No abnormal joints or musculature NEUROLOGIC: Cranial nerves 2-12 grossly intact. Moves all extremities PSYCHIATRIC: Mood and affect appropriate to situation, no behavioral issues  Patient Active Problem List   Diagnosis Date Noted  . Benign paroxysmal positional vertigo 05/26/2014  . OA (osteoarthritis) 05/12/2014  . Personal history of PE (pulmonary embolism) 05/12/2014  . DM type 2, controlled, with complication 45/05/8880  . Dehydration   . Hypotension 05/10/2014  . Prerenal acute renal failure 05/09/2014  . Altered mental status   . Delirium 04/12/2014  . Unsteady gait 04/12/2014  . Orthostatic hypotension 05/16/2013  . Generalized weakness 04/30/2013  . Left hip pain 12/10/2012  . At high risk for falls 07/19/2012  . Diarrhea 04/06/2012  . Vitamin D deficiency 03/12/2012  . Falls 02/17/2012  . Bradycardia 01/22/2012  . Hemorrhoid 01/12/2012  . GERD (gastroesophageal reflux disease) 11/10/2011  . Knee pain, bilateral 10/14/2011  .  Leg edema 09/20/2011  . Fatigue 07/04/2011  . Peripheral vascular disease, unspecified 04/16/2011  . DM (diabetes mellitus), type 2, uncontrolled 04/16/2011  . Vancomycin adverse reaction 03/13/2011  . COPD (chronic obstructive pulmonary disease) 02/27/2011  . Stasis dermatitis 12/04/2010  . BPH (benign prostatic hyperplasia) 11/01/2010  . Long term current use of anticoagulant therapy 05/03/2010  . FROZEN RIGHT SHOULDER 02/25/2010  . DIABETIC PERIPHERAL NEUROPATHY 12/20/2009  . Depression 07/08/2006  . Essential hypertension 07/08/2006  . HYPERCHOLESTEROLEMIA 05/21/2006  . Anxiety state 05/21/2006  . CORONARY, ARTERIOSCLEROSIS 05/21/2006  . Chronic systolic heart failure 80/05/4915  . REFLUX ESOPHAGITIS 05/21/2006  . APNEA, SLEEP 05/21/2006    CBC    Component Value Date/Time   WBC 9.2 05/11/2014 0500   RBC 5.16 05/11/2014 0500   HGB 13.7 05/11/2014 0500   HCT 42.1 05/11/2014 0500   PLT 163 05/11/2014 0500   MCV 81.6 05/11/2014 0500   LYMPHSABS 1.8 05/11/2014 0500   MONOABS 0.9 05/11/2014 0500   EOSABS 0.2 05/11/2014 0500   BASOSABS 0.0 05/11/2014 0500    CMP     Component Value Date/Time   NA 139 05/11/2014 0500   K 4.2 05/11/2014 0500   CL 108 05/11/2014 0500   CO2 24 05/11/2014 0500   GLUCOSE 133* 05/11/2014 0500   BUN 14 05/11/2014 0500   CREATININE 0.94 05/11/2014 0500   CREATININE 1.11 06/14/2013 1221   CALCIUM 9.0 05/11/2014 0500   PROT 6.7 05/09/2014 1631   ALBUMIN 3.9 05/09/2014 1631   AST 22 05/09/2014 1631   ALT 20 05/09/2014 1631   ALKPHOS 70 05/09/2014 1631   BILITOT 0.6 05/09/2014 1631   GFRNONAA 80* 05/11/2014 0500   GFRAA >90 05/11/2014 0500    Assessment and Plan  Hypotension H/o hypotension and orthostatic hypotension. Incident described by weekend nurse was from defecation. Pt's BP is always borderline, though. I can decrease Lisinopril to 2.5 mg daily.   Orthostatic hypotension Has this as well and incident over weekend, defecation  hypotension    Benign paroxysmal positional vertigo To make the hypotension more interesting pt also has episodes of vertigo, for which he has antivert, which resolves symptoms   Chronic systolic heart failure Lungs are clear, trace edema only, stable at this time     Hennie Duos, MD

## 2014-05-26 NOTE — Assessment & Plan Note (Signed)
Has this as well and incident over weekend, defecation hypotension

## 2014-05-26 NOTE — Assessment & Plan Note (Signed)
To make the hypotension more interesting pt also has episodes of vertigo, for which he has antivert, which resolves symptoms

## 2014-05-26 NOTE — Assessment & Plan Note (Signed)
Lungs are clear, trace edema only, stable at this time

## 2014-05-26 NOTE — Assessment & Plan Note (Signed)
H/o hypotension and orthostatic hypotension. Incident described by weekend nurse was from defecation. Pt's BP is always borderline, though. I can decrease Lisinopril to 2.5 mg daily.

## 2014-06-01 ENCOUNTER — Non-Acute Institutional Stay (SKILLED_NURSING_FACILITY): Payer: Medicare Other | Admitting: Internal Medicine

## 2014-06-01 DIAGNOSIS — I5022 Chronic systolic (congestive) heart failure: Secondary | ICD-10-CM

## 2014-06-01 DIAGNOSIS — Z7901 Long term (current) use of anticoagulants: Secondary | ICD-10-CM

## 2014-06-01 DIAGNOSIS — I10 Essential (primary) hypertension: Secondary | ICD-10-CM

## 2014-06-01 DIAGNOSIS — I9589 Other hypotension: Secondary | ICD-10-CM | POA: Diagnosis not present

## 2014-06-01 DIAGNOSIS — E118 Type 2 diabetes mellitus with unspecified complications: Secondary | ICD-10-CM

## 2014-06-01 DIAGNOSIS — M159 Polyosteoarthritis, unspecified: Secondary | ICD-10-CM

## 2014-06-01 NOTE — Progress Notes (Signed)
Patient ID: [name], male   DOB: Apr 14, 1940, 74 y.o.   MRN: [id number]   this is a discharge note.  Level care skilled.  [facility].   Chief Complaint  Patient presents with  .  discharge note    HPI: Patient is 74 y.o. male who is admitted for generalized weakness for OT/PT after being hospitalized for hypotension and pre-renal azotemia.--He appears to have gained some strength appears to ambulate fairly well now with a walker continues to complain of right shoulder pain which is chronic.  He does have a history of hypotension October Alexander saw him recently-most recent hypotensive episode was thought secondary to having a bowel movement.  Dr. Sheppard Coil did decrease his lisinopril this appears to have helped.  Most recent blood pressure 120/62-116/71-124/48-the lowest one I see is 102/55-he has been asymptomatic.  This is complicated somewhat with a history of vertical for which he receives Antivert.  Patient is a type II diabetic he is on Novolin N 35 units twice a day CBGs in the morning appear to be largely in the mid 100s-later in the day at 4:30 appears to be more mid 100s occasionally above 200 currently patient has no acute complaints she is looking forward to going home-he has a supportive wife although apparently she also has medical issues.  Talking with patient apparently daughter will be involved.  He is on chronic Coumadin with a history of pulmonary embolism this will have to be monitored closely as an outpatient INR is pending-this has been stable during his stay here and therapeutic  Of note patient has been seen by psychiatric nurse practitioner this week and started on Remeron apparently for insomnia he does have a history of depression as well and continues on Effexor  Past Medical History  Diagnosis Date  . Depression   . Diabetes mellitus   . COPD (chronic obstructive pulmonary disease)   . Adhesive capsulitis   . MRSA bacteremia     2011  - possible endocarditis, received 6 weeks IV treatment  . Hyperlipidemia   . Anxiety   . GERD (gastroesophageal reflux disease)   . CAD (coronary artery disease)   . 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 CVA (cerebral infarction) Questionable history  . Diverticulosis   . Osteomyelitis 2012    Sternoclavicular joint   . Chronic back pain   . Angina   . Myocardial infarction   . CHF (congestive heart failure)   . Shortness of breath   . Sleep apnea   . Chronic kidney disease     hx of BPH  . Neuromuscular disorder     HX of diabetic periferal neuropathy  . Hypertension   . Peripheral vascular disease   . On home oxygen therapy     "1.5L prn" (04/12/2014)    Past Surgical History  Procedure Laterality Date  . Septic arthritis      Removal of infected CABG wire by Dr Arlyce Dice - 2011  . Coronary artery bypass graft  1992  . Fracture surgery  1980s    Hip  . Spine surgery  2002    Cervical fusion vertebroplasty C3-4-5  . Coronary stent placement  1999, 2002  . Doppler echocardiography  Sept 2011    EF 50-55% with some impaired diastolic relaxation  . Colonoscopy  2006     Multiple polyps removed, repeat in 5 years  . Right iliopopliteal bypass - [place]  . Femoral-popliteal  bypass graft  2008    Left  . Cardiac catheterization  October 18, 2010    Known obstructive disease, no further blockages  . Cardiac catheterization  03/17/2005    EF 35-40%  . US echocardiography  03/04/2006    EF 50-55%  . Amputation  03/16/2011    Procedure: AMPUTATION DIGIT;  Surgeon: Angelia Mould, MD;  Location: [facility] OR;  Service: Vascular;  Laterality: Left;  Great toe      Medication List       .         Remeron15 mg by mouth daily at bedtime       acetaminophen-codeine 300-60 MG per tablet  Commonly known as:  TYLENOL #4  Take 1 tablet by mouth every 8 (eight) hours as needed for moderate pain.     atorvastatin 40 MG tablet  Commonly known as:   LIPITOR  Take 1 tablet (40 mg total) by mouth daily at 6 PM.     bisoprolol 5 MG tablet  Commonly known as:  ZEBETA  Take 0.5 tablets (2.5 mg total) by mouth daily.     glucose blood test strip  Use as instructed.  One Step Ultra 2 strips.     insulin NPH Human 100 UNIT/ML injection  Commonly known as:  HUMULIN N,NOVOLIN N  Inject 0.15 mLs (15 Units total) into the skin 2 (two) times daily before a meal.     Insulin Syringe-Needle U-100 30G X 1/2" 0.5 ML Misc  Commonly known as:  B-D INS SYRINGE 0.5CC/30GX1/2"  For use with insulin     lidocaine 5 %  Commonly known as:  LIDODERM  Place 1 patch onto the skin daily. Remove & Discard patch within 12 hours or as directed by MD     lisinopril 2.5 MG tablet  Commonly known as:  PRINIVIL,ZESTRIL  Take 1 tablet (2.5 mg total) by mouth daily.     LORazepam 0.5 MG tablet  Commonly known as:  ATIVAN  Take 0.5 tablets (0.25 mg total) by mouth at bedtime.     meclizine 25 MG tablet  Commonly known as:  ANTIVERT  TAKE ONE TABLET BY MOUTH THREE TIMES DAILY AS NEEDED FOR  DIZZINESS     NEEDLE (DISP) 30 G 30G X 1" Misc  Commonly known as:  B-D DISP NEEDLE 30GX1"  For using with insulin.     nitroGLYCERIN 0.4 MG SL tablet  Commonly known as:  NITROSTAT  Place 1 tablet (0.4 mg total) under the tongue as needed. For chest pains     omeprazole 40 MG capsule  Commonly known as:  PRILOSEC  Take 1 capsule (40 mg total) by mouth daily.     ONE TOUCH ULTRA 2 W/DEVICE Kit  Please supply patient with 1 glucometer device to use 4 times daily to check blood sugars.  Dx code: 250.0     tamsulosin 0.4 MG Caps capsule  Commonly known as:  FLOMAX  Take 1 capsule (0.4 mg total) by mouth daily after supper.     torsemide 20 MG tablet  Commonly known as:  DEMADEX  TAKE ONE TABLET BY MOUTH EVERY DAY     traMADol 50 MG tablet  Commonly known as:  ULTRAM  TAKE ONE TO TWO TABLETS BY MOUTH EVERY 8 HOURS AS NEEDED     TYLENOL ARTHRITIS PAIN PO    Take 1-2 tablets by mouth 2 (two) times daily as needed (for arthritis pain).     venlafaxine XR 37.5  MG 24 hr capsule  Commonly known as:  EFFEXOR-XR  TAKE ONE CAPSULE BY MOUTH ONCE DAILY     warfarin 5 MG tablet  Commonly known as:  COUMADIN  Take 5 mg by mouth daily at 6 PM.        No orders of the defined types were placed in this encounter.    Immunization History  Administered Date(s) Administered  . Influenza Split 12/12/2010  . Influenza Whole 12/22/2005, 12/14/2007  . Influenza,inj,Quad PF,36+ Mos 01/20/2013, 12/09/2013  . Pneumococcal Conjugate-13 10/10/2013  . Pneumococcal Polysaccharide-23 11/23/1999, 03/25/2003, 05/25/2011  . Td 10/23/2003    History  Substance Use Topics  . Smoking status: Former Smoker    Quit date: 06/19/2000  . Smokeless tobacco: Never Used  . Alcohol Use: No    Family history is noncontributory    Review of Systems  DATA OBTAINED: from patient, nurse GENERAL:  no fevers, fatigue, appetite changes SKIN: No itching, rash or wounds EYES: No eye pain, redness, discharge EARS: No earache, tinnitus, change in hearing NOSE: No congestion, drainage or bleeding  MOUTH/THROAT: No mouth or tooth pain, No sore throat RESPIRATORY: No cough, wheezing, SOB CARDIAC: No chest pain, palpitations, lower extremity edema  GI: No abdominal pain, No N/V/D or constipation, No heartburn or reflux  GU: No dysuria, frequency or urgency, or incontinence  MUSCULOSKELETAL: No unrelieved bone/joint pain--continues to complain of right shoulder discomfort which is chronic NEUROLOGIC: No headache, dizziness or focal weakness PSYCHIATRIC: No overt anxiety or sadness, No behavior issue.  Marland Kitchen  Physical Exam Temperature 99.1 pulse 87 respirations 18 blood pressure 120/62 GENERAL APPEARANCE: Alert, conversant,  No acute distress.  SKIN: No diaphoresis rash HEAD: Normocephalic, atraumatic  EYES: Conjunctiva/lids clear. Pupils round, reactive. EOMs intact.   EARS: External exam WNL, canals clear. Hearing grossly normal.  NOSE: No deformity or discharge.  MOUTH/THROAT: Lips w/o lesions  RESPIRATORY: Breathing is even, unlabored. Lung sounds are clear   CARDIOVASCULAR: Heart RRR--distant heart sounds-- no murmurs, rubs or gallops. trace peripheral edema.   GASTROINTESTINAL: Abdomen is soft, non-tender, not distended w/ normal bowel sounds.   MUSCULOSKELETAL: No abnormal joints or musculature--continues with right shoulder discomfort could not appreciate any deformity-he is able to stand without assistance and ambulate fairly well with a walker-grip strength is intact and strong bilaterally NEUROLOGIC:  Cranial nerves 2-12 grossly intact. Moves all extremities  PSYCHIATRIC: Mood and affect appropriate to situation, no behavioral issues  Patient Active Problem List   Diagnosis Date Noted  . OA (osteoarthritis) 05/12/2014  . Personal history of PE (pulmonary embolism) 05/12/2014  . DM type 2, controlled, with complication 50/27/7412  . Dehydration   . Hypotension 05/10/2014  . Prerenal acute renal failure 05/09/2014  . Altered mental status   . Delirium 04/12/2014  . Unsteady gait 04/12/2014  . Orthostatic hypotension 05/16/2013  . Generalized weakness 04/30/2013  . Left hip pain 12/10/2012  . At high risk for falls 07/19/2012  . Diarrhea 04/06/2012  . Vitamin D deficiency 03/12/2012  . Falls 02/17/2012  . Bradycardia 01/22/2012  . Hemorrhoid 01/12/2012  . GERD (gastroesophageal reflux disease) 11/10/2011  . Knee pain, bilateral 10/14/2011  . Leg edema 09/20/2011  . Fatigue 07/04/2011  . Peripheral vascular disease, unspecified 04/16/2011  . DM (diabetes mellitus), type 2, uncontrolled 04/16/2011  . Vancomycin adverse reaction 03/13/2011  . COPD (chronic obstructive pulmonary disease) 02/27/2011  . Stasis dermatitis 12/04/2010  . BPH (benign prostatic hyperplasia) 11/01/2010  . Long term current use of anticoagulant therapy  05/03/2010  . FROZEN RIGHT SHOULDER 02/25/2010  . DIABETIC PERIPHERAL NEUROPATHY 12/20/2009  . Depression 07/08/2006  . Essential hypertension 07/08/2006  . HYPERCHOLESTEROLEMIA 05/21/2006  . Anxiety state 05/21/2006  . CORONARY, ARTERIOSCLEROSIS 05/21/2006  . Chronic systolic heart failure 66/44/0347  . REFLUX ESOPHAGITIS 05/21/2006  . APNEA, SLEEP 05/21/2006    CBC    Component Value Date/Time   WBC 9.2 05/11/2014 0500   RBC 5.16 05/11/2014 0500   HGB 13.7 05/11/2014 0500   HCT 42.1 05/11/2014 0500   PLT 163 05/11/2014 0500   MCV 81.6 05/11/2014 0500   LYMPHSABS 1.8 05/11/2014 0500   MONOABS 0.9 05/11/2014 0500   EOSABS 0.2 05/11/2014 0500   BASOSABS 0.0 05/11/2014 0500    CMP     Component Value Date/Time   NA 139 05/11/2014 0500   K 4.2 05/11/2014 0500   CL 108 05/11/2014 0500   CO2 24 05/11/2014 0500   GLUCOSE 133* 05/11/2014 0500   BUN 14 05/11/2014 0500   CREATININE 0.94 05/11/2014 0500   CREATININE 1.11 06/14/2013 1221   CALCIUM 9.0 05/11/2014 0500   PROT 6.7 05/09/2014 1631   ALBUMIN 3.9 05/09/2014 1631   AST 22 05/09/2014 1631   ALT 20 05/09/2014 1631   ALKPHOS 70 05/09/2014 1631   BILITOT 0.6 05/09/2014 1631   GFRNONAA 80* 05/11/2014 0500   GFRAA >90 05/11/2014 0500    Assessment and Plan  Hypotension known orthostatic hypotension - Pt. Was admitted with blood pressures to 64/36 on admission in the ED. He was symptomatic with dizziness and weakness in the ED as well. Upon chart review, he does have some light headedness with known orthostasis at baseline. However, his current low blood pressures were in the setting of recent diarrhea that has since resolved likely contributing to even lower blood pressures. He was given gentle IV fluid rehydration during hospitaladmission given his history of CHF. His blood pressure medications were also held during t admission   His blood pressures responded nicely to IV fluid rehydration, and he was found to be safe  and stable for discharge to SNF for physical therapy and outpatient rehabilitation.--She has completed he will need continued PT and OT at home.  Blood pressures appear to be improved as stated above    Prerenal acute renal failure - Pt. Admitted with recent history of diarrhea that has since resolved. He was found to have an elevated creatinine to 1.8 with a baseline around 1.1. His AKI was found to be prerenal in etiology. He was started on IV fluids due to the above problem. His creatinine improved to 0.94 two days after admission, and by discharge his AKI was resolved--we will recheck basic metabolic panel.    Chronic systolic heart failure - Echo 04/2013 with EF 40-45%. Given gentle IV fluids for above problems, but without evidence of peripheral edema or pulmonary findings consistent with fluid overload on exam. Home Torsemide was held during this hospitalization and resumed at discharge. --This has not been an significant issue during his stay here will update a metabolic panel   OA (osteoarthritis) R shoulder and back- Scheduled Tylenol 644m q6hrs. Here with Tramadol [id number] as needed for break through pain.  On Tylenol 4 with codeine at home for pain control--this has been a chronic situation   Long term current use of anticoagulant therapy Hx PE;continue coumadin--update INR tomorrow-this also will have to be followed closely by home health on discharge   Personal history of PE (pulmonary embolism) Pt on chronic  coumadin   DM type 2, controlled, with complication - Pt. With Hgb A1C of 8.7 in 11/2013. A1C was ordered here, and was 7.4. He was continued on his home NPH insulin here however with a reduced dose to 15 units BID wc here from 35 units BID. Sliding Scale Insulin was ordered for breakthrough glucose control --as stated above this is been relatively stable   Essential hypertension On lisinopril, Demadex, and Zebeta. -His lisinopril was decreased to 2.5 mg by Dr.  Sheppard Coil this appears to have helped   BPH (benign prostatic hyperplasia) Continue flomax   Depression Continue effexor--of note he has just been seen by psych and Remeron was added apparently for insomnia complaints   Anxiety state Continue lorazepam    Patient will be going home he will need PT OT  for strengthening and home health support for his numerous medical issues including diabetes and follow-up CHF and follow anticoagulation management- INR will have to be monitored closely once he goes home and INR is pending for tomorrow.  [id number] note greater than 30 minutes spent on this discharge summary  He will be going home to his wife apparently his daughter also is quite involved.

## 2014-06-03 ENCOUNTER — Encounter: Payer: Self-pay | Admitting: Internal Medicine

## 2014-06-06 ENCOUNTER — Encounter: Payer: Self-pay | Admitting: Family Medicine

## 2014-06-06 ENCOUNTER — Ambulatory Visit (INDEPENDENT_AMBULATORY_CARE_PROVIDER_SITE_OTHER): Payer: Medicare Other | Admitting: Family Medicine

## 2014-06-06 ENCOUNTER — Ambulatory Visit (INDEPENDENT_AMBULATORY_CARE_PROVIDER_SITE_OTHER): Payer: Medicare Other | Admitting: *Deleted

## 2014-06-06 VITALS — BP 117/57 | HR 86 | Temp 97.8°F | Ht 68.0 in | Wt 254.4 lb

## 2014-06-06 DIAGNOSIS — R531 Weakness: Secondary | ICD-10-CM

## 2014-06-06 DIAGNOSIS — Z7901 Long term (current) use of anticoagulants: Secondary | ICD-10-CM

## 2014-06-06 DIAGNOSIS — E118 Type 2 diabetes mellitus with unspecified complications: Secondary | ICD-10-CM | POA: Diagnosis not present

## 2014-06-06 DIAGNOSIS — W19XXXS Unspecified fall, sequela: Secondary | ICD-10-CM | POA: Diagnosis not present

## 2014-06-06 DIAGNOSIS — J449 Chronic obstructive pulmonary disease, unspecified: Secondary | ICD-10-CM

## 2014-06-06 DIAGNOSIS — R2681 Unsteadiness on feet: Secondary | ICD-10-CM | POA: Diagnosis not present

## 2014-06-06 LAB — POCT INR: INR: 2.1

## 2014-06-06 MED ORDER — INSULIN ASPART 100 UNIT/ML ~~LOC~~ SOLN: SUBCUTANEOUS | Status: DC

## 2014-06-06 MED ORDER — SIMVASTATIN 80 MG PO TABS: 80.0000 mg | ORAL_TABLET | Freq: Every day | ORAL | Status: DC

## 2014-06-06 MED ORDER — INSULIN NPH (HUMAN) (ISOPHANE) 100 UNIT/ML ~~LOC~~ SUSP: 15.0000 [IU] | Freq: Two times a day (BID) | SUBCUTANEOUS | Status: DC

## 2014-06-06 MED ORDER — LISINOPRIL 5 MG PO TABS: 2.5000 mg | ORAL_TABLET | Freq: Every day | ORAL | Status: DC

## 2014-06-06 NOTE — Patient Instructions (Signed)
It was good to see you again today.    I think the sliding scale is good for the insulin.    I will put in the orders for home health physical therapy.

## 2014-06-06 NOTE — Progress Notes (Signed)
Subjective:    [name] is a 74 y.o. male who presents to [facility] today for DC from nursing home:  1.  Patient went to nursing home for weakness and dehydration.  Was at [facility].  Discharged today.  Does not have any paperwork with him.  States he did very well there, PT multiple times a day.  Eating was better.  Feels better overall.  Balance is better.   2.  Diabetes:  Currently on Novolog 15 units BID.  They added sliding scale at the nursing home.   No adverse effects from medication.  No hypoglycemic events.  Measures blood sugars at home every:  Day or so.   Lab Results  Component Value Date   HGBA1C 7.4* 05/10/2014   3.  COPD:  Declines any long-acting inhalers.  Hasn't had an actual flare for at least over a year.    ROS as above per HPI, otherwise neg.  Pertinently, no chest pain, palpitations, SOB, Fever, Chills, Abd pain, N/V/D.   The following portions of the patient's history were reviewed and updated as appropriate: allergies, current medications, past medical history, family and social history, and problem list. Patient is a nonsmoker.    PMH reviewed.  Past Medical History  Diagnosis Date  . Depression   . Diabetes mellitus   . COPD (chronic obstructive pulmonary disease)   . Adhesive capsulitis   . MRSA bacteremia     2011 - possible endocarditis, received 6 weeks IV treatment  . Hyperlipidemia   . Anxiety   . GERD (gastroesophageal reflux disease)   . CAD (coronary artery disease)   . 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 CVA (cerebral infarction) Questionable history  . Diverticulosis   . Osteomyelitis 2012    Sternoclavicular joint   . Chronic back pain   . Angina   . Myocardial infarction   . CHF (congestive heart failure)   . Shortness of breath   . Sleep apnea   . Chronic kidney disease     hx of BPH  . Neuromuscular disorder     HX of diabetic periferal neuropathy  . Hypertension   . Peripheral vascular  disease   . On home oxygen therapy     "1.5L prn" (04/12/2014)   Past Surgical History  Procedure Laterality Date  . Septic arthritis      Removal of infected CABG wire by Dr Arlyce Dice - 2011  . Coronary artery bypass graft  1992  . Fracture surgery  1980s    Hip  . Spine surgery  2002    Cervical fusion vertebroplasty C3-4-5  . Coronary stent placement  1999, 2002  . Doppler echocardiography  Sept 2011    EF 50-55% with some impaired diastolic relaxation  . Colonoscopy  2006     Multiple polyps removed, repeat in 5 years  . Right iliopopliteal bypass - [facility]  . Femoral-popliteal bypass graft  2008    Left  . Cardiac catheterization  October 18, 2010    Known obstructive disease, no further blockages  . Cardiac catheterization  03/17/2005    EF 35-40%  . US echocardiography  03/04/2006    EF 50-55%  . Amputation  03/16/2011    Procedure: AMPUTATION DIGIT;  Surgeon: Angelia Mould, MD;  [facility];  Service: Vascular;  Laterality: Left;  Great toe    Medications reviewed. Current Outpatient Prescriptions  Medication Sig Dispense Refill  .  Acetaminophen (TYLENOL ARTHRITIS PAIN PO) Take 1-2 tablets by mouth 2 (two) times daily as needed (for arthritis pain).    Marland Kitchen acetaminophen-codeine (TYLENOL #4) 300-60 MG per tablet Take 1 tablet by mouth every 8 (eight) hours as needed for moderate pain. 30 tablet 2  . atorvastatin (LIPITOR) 40 MG tablet Take 1 tablet (40 mg total) by mouth daily at 6 PM. 30 tablet 0  . bisoprolol (ZEBETA) 5 MG tablet Take 0.5 tablets (2.5 mg total) by mouth daily. 30 tablet 0  . Blood Glucose Monitoring Suppl (ONE TOUCH ULTRA 2) W/DEVICE KIT Please supply patient with 1 glucometer device to use 4 times daily to check blood sugars.  Dx code: 250.0 1 each 1  . glucose blood test strip Use as instructed.  One Step Ultra 2 strips. 100 each 12  . insulin aspart (NOVOLOG) 100 UNIT/ML injection Use sliding scale as prescribed:  0 - 150 0 units; 151 - 200 2  units, 201 - 250 4 units; 251 - 200 6 units, 301 - 350 8 units 10 mL 11  . insulin NPH Human (HUMULIN N,NOVOLIN N) 100 UNIT/ML injection Inject 0.15 mLs (15 Units total) into the skin 2 (two) times daily before a meal. 10 mL 11  . Insulin Syringe-Needle U-100 (B-D INS SYRINGE 0.5CC/30GX1/2") 30G X 1/2" 0.5 ML MISC For use with insulin 100 each 0  . lidocaine (LIDODERM) 5 % Place 1 patch onto the skin daily. Remove & Discard patch within 12 hours or as directed by MD 30 patch 0  . lisinopril (PRINIVIL,ZESTRIL) 5 MG tablet Take 0.5 tablets (2.5 mg total) by mouth daily. 30 tablet 1  . LORazepam (ATIVAN) 0.5 MG tablet Take 0.5 tablets (0.25 mg total) by mouth at bedtime. 20 tablet 0  . meclizine (ANTIVERT) 25 MG tablet TAKE ONE TABLET BY MOUTH THREE TIMES DAILY AS NEEDED FOR  DIZZINESS 30 tablet 0  . mirtazapine (REMERON) 15 MG tablet Take 15 mg by mouth at bedtime.    Marland Kitchen NEEDLE, DISP, 30 G (B-D DISP NEEDLE 30GX1") 30G X 1" MISC For using with insulin. 100 each 0  . nitroGLYCERIN (NITROSTAT) 0.4 MG SL tablet Place 1 tablet (0.4 mg total) under the tongue as needed. For chest pains 30 tablet 6  . omeprazole (PRILOSEC) 40 MG capsule Take 1 capsule (40 mg total) by mouth daily. 30 capsule 3  . simvastatin (ZOCOR) 80 MG tablet Take 1 tablet (80 mg total) by mouth daily. 30 tablet 6  . tamsulosin (FLOMAX) 0.4 MG CAPS capsule Take 1 capsule (0.4 mg total) by mouth daily after supper. 90 capsule 1  . torsemide (DEMADEX) 20 MG tablet TAKE ONE TABLET BY MOUTH EVERY DAY 60 tablet 2  . venlafaxine XR (EFFEXOR-XR) 37.5 MG 24 hr capsule TAKE ONE CAPSULE BY MOUTH ONCE DAILY 30 capsule 11  . warfarin (COUMADIN) 5 MG tablet Take 5 mg by mouth daily at 6 PM.      No current facility-administered medications for this visit.     Objective:   Physical Exam BP 117/57 mmHg  Pulse 86  Temp(Src) 97.8 F (36.6 C) (Oral)  Ht _0  (1.727 m)  Wt 254 lb 6.4 oz (115.395 kg)  BMI 38.69 kg/m2 Gen:  Alert, cooperative  patient who appears stated age in no acute distress.  Vital signs reviewed. HEENT: EOMI,  MMM Cardiac:  Regular rate and rhythm  Pulm:  Clear to auscultation bilaterally  Abd:  Soft/nondistended/nontender.   Exts: +2 edema to mid-shins.  Pitting.  No open sores.   Neuro:  Ambulates with cane.  Balance is good today.   No results found for this or any previous visit (from the past 72 hour(s)).

## 2014-06-07 DIAGNOSIS — M6281 Muscle weakness (generalized): Secondary | ICD-10-CM | POA: Diagnosis not present

## 2014-06-07 DIAGNOSIS — E1165 Type 2 diabetes mellitus with hyperglycemia: Secondary | ICD-10-CM | POA: Diagnosis not present

## 2014-06-07 DIAGNOSIS — I2510 Atherosclerotic heart disease of native coronary artery without angina pectoris: Secondary | ICD-10-CM | POA: Diagnosis not present

## 2014-06-07 DIAGNOSIS — F329 Major depressive disorder, single episode, unspecified: Secondary | ICD-10-CM | POA: Diagnosis not present

## 2014-06-07 DIAGNOSIS — E1140 Type 2 diabetes mellitus with diabetic neuropathy, unspecified: Secondary | ICD-10-CM | POA: Diagnosis not present

## 2014-06-07 DIAGNOSIS — G8929 Other chronic pain: Secondary | ICD-10-CM | POA: Diagnosis not present

## 2014-06-07 DIAGNOSIS — I5022 Chronic systolic (congestive) heart failure: Secondary | ICD-10-CM | POA: Diagnosis not present

## 2014-06-07 DIAGNOSIS — R2689 Other abnormalities of gait and mobility: Secondary | ICD-10-CM | POA: Diagnosis not present

## 2014-06-07 DIAGNOSIS — F411 Generalized anxiety disorder: Secondary | ICD-10-CM | POA: Diagnosis not present

## 2014-06-07 DIAGNOSIS — J449 Chronic obstructive pulmonary disease, unspecified: Secondary | ICD-10-CM | POA: Diagnosis not present

## 2014-06-07 NOTE — Assessment & Plan Note (Signed)
No recent exacerbations.  Discussed having a rescue inhaler on hand at home.  Declined today.   Able to walk around neighborhood without becoming short of breath. Declined long-acting inhalers as well.

## 2014-06-07 NOTE — Assessment & Plan Note (Signed)
Plan to refer for home health PT today.

## 2014-06-07 NOTE — Assessment & Plan Note (Signed)
Better today. Agree that sliding scale is good for him.   FU in 1 month to check CBG log.

## 2014-06-07 NOTE — Assessment & Plan Note (Signed)
Improved since time at nursing home.   He could still use further home health PT to help keep strength and reduce risk of falls.

## 2014-06-12 ENCOUNTER — Telehealth: Payer: Self-pay | Admitting: *Deleted

## 2014-06-12 NOTE — Telephone Encounter (Signed)
Physical Therapist from [facility] called to inform MD that they visited patient today to start care but patient has decided to hold off on PT right now. FYI to PCP

## 2014-06-13 ENCOUNTER — Telehealth: Payer: Self-pay | Admitting: Family Medicine

## 2014-06-13 ENCOUNTER — Telehealth: Payer: Self-pay | Admitting: *Deleted

## 2014-06-13 DIAGNOSIS — E1140 Type 2 diabetes mellitus with diabetic neuropathy, unspecified: Secondary | ICD-10-CM | POA: Diagnosis not present

## 2014-06-13 DIAGNOSIS — I2510 Atherosclerotic heart disease of native coronary artery without angina pectoris: Secondary | ICD-10-CM | POA: Diagnosis not present

## 2014-06-13 DIAGNOSIS — G8929 Other chronic pain: Secondary | ICD-10-CM | POA: Diagnosis not present

## 2014-06-13 DIAGNOSIS — E1165 Type 2 diabetes mellitus with hyperglycemia: Secondary | ICD-10-CM | POA: Diagnosis not present

## 2014-06-13 DIAGNOSIS — F329 Major depressive disorder, single episode, unspecified: Secondary | ICD-10-CM | POA: Diagnosis not present

## 2014-06-13 DIAGNOSIS — J449 Chronic obstructive pulmonary disease, unspecified: Secondary | ICD-10-CM | POA: Diagnosis not present

## 2014-06-13 DIAGNOSIS — I5022 Chronic systolic (congestive) heart failure: Secondary | ICD-10-CM | POA: Diagnosis not present

## 2014-06-13 DIAGNOSIS — R2689 Other abnormalities of gait and mobility: Secondary | ICD-10-CM | POA: Diagnosis not present

## 2014-06-13 DIAGNOSIS — M6281 Muscle weakness (generalized): Secondary | ICD-10-CM | POA: Diagnosis not present

## 2014-06-13 DIAGNOSIS — F411 Generalized anxiety disorder: Secondary | ICD-10-CM | POA: Diagnosis not present

## 2014-06-13 NOTE — Telephone Encounter (Signed)
Mallory from [facility] called to request verbal orders for OT for the patient at 2 times a week for 3 weeks and also would like to use a pulse oximeter. Please call Mallory at [phone]. jw

## 2014-06-13 NOTE — Telephone Encounter (Signed)
Carol from [facility] called.  She needs verbal orders to recertify pt for home health nursing. They assist pt with pain management, medication management, and DM management.

## 2014-06-14 NOTE — Telephone Encounter (Signed)
[name] informed.  [name], [name]

## 2014-06-14 NOTE — Telephone Encounter (Signed)
Please call and provide verbal orders.

## 2014-06-15 NOTE — Telephone Encounter (Signed)
Spoke with Mallory and gave verbal ok. Also she will report is O2 gets under 90% per Dr. Mingo Amber

## 2014-06-15 NOTE — Telephone Encounter (Signed)
[name] is calling back to see if the doctor was going to give verbal orders for the patient. jw

## 2014-06-16 DIAGNOSIS — M6281 Muscle weakness (generalized): Secondary | ICD-10-CM | POA: Diagnosis not present

## 2014-06-16 DIAGNOSIS — I5022 Chronic systolic (congestive) heart failure: Secondary | ICD-10-CM | POA: Diagnosis not present

## 2014-06-16 DIAGNOSIS — E1165 Type 2 diabetes mellitus with hyperglycemia: Secondary | ICD-10-CM | POA: Diagnosis not present

## 2014-06-16 DIAGNOSIS — E1140 Type 2 diabetes mellitus with diabetic neuropathy, unspecified: Secondary | ICD-10-CM | POA: Diagnosis not present

## 2014-06-16 DIAGNOSIS — I2510 Atherosclerotic heart disease of native coronary artery without angina pectoris: Secondary | ICD-10-CM | POA: Diagnosis not present

## 2014-06-16 DIAGNOSIS — R2689 Other abnormalities of gait and mobility: Secondary | ICD-10-CM | POA: Diagnosis not present

## 2014-06-16 DIAGNOSIS — F329 Major depressive disorder, single episode, unspecified: Secondary | ICD-10-CM | POA: Diagnosis not present

## 2014-06-16 DIAGNOSIS — J449 Chronic obstructive pulmonary disease, unspecified: Secondary | ICD-10-CM | POA: Diagnosis not present

## 2014-06-16 DIAGNOSIS — F411 Generalized anxiety disorder: Secondary | ICD-10-CM | POA: Diagnosis not present

## 2014-06-16 DIAGNOSIS — G8929 Other chronic pain: Secondary | ICD-10-CM | POA: Diagnosis not present

## 2014-06-19 DIAGNOSIS — F411 Generalized anxiety disorder: Secondary | ICD-10-CM | POA: Diagnosis not present

## 2014-06-19 DIAGNOSIS — J449 Chronic obstructive pulmonary disease, unspecified: Secondary | ICD-10-CM | POA: Diagnosis not present

## 2014-06-19 DIAGNOSIS — G8929 Other chronic pain: Secondary | ICD-10-CM | POA: Diagnosis not present

## 2014-06-19 DIAGNOSIS — E1140 Type 2 diabetes mellitus with diabetic neuropathy, unspecified: Secondary | ICD-10-CM | POA: Diagnosis not present

## 2014-06-19 DIAGNOSIS — F329 Major depressive disorder, single episode, unspecified: Secondary | ICD-10-CM | POA: Diagnosis not present

## 2014-06-19 DIAGNOSIS — M6281 Muscle weakness (generalized): Secondary | ICD-10-CM | POA: Diagnosis not present

## 2014-06-19 DIAGNOSIS — R2689 Other abnormalities of gait and mobility: Secondary | ICD-10-CM | POA: Diagnosis not present

## 2014-06-19 DIAGNOSIS — I2510 Atherosclerotic heart disease of native coronary artery without angina pectoris: Secondary | ICD-10-CM | POA: Diagnosis not present

## 2014-06-19 DIAGNOSIS — I5022 Chronic systolic (congestive) heart failure: Secondary | ICD-10-CM | POA: Diagnosis not present

## 2014-06-19 DIAGNOSIS — E1165 Type 2 diabetes mellitus with hyperglycemia: Secondary | ICD-10-CM | POA: Diagnosis not present

## 2014-06-20 ENCOUNTER — Ambulatory Visit (INDEPENDENT_AMBULATORY_CARE_PROVIDER_SITE_OTHER): Payer: Medicare Other | Admitting: *Deleted

## 2014-06-20 DIAGNOSIS — Z7901 Long term (current) use of anticoagulants: Secondary | ICD-10-CM | POA: Diagnosis not present

## 2014-06-20 DIAGNOSIS — I5022 Chronic systolic (congestive) heart failure: Secondary | ICD-10-CM | POA: Diagnosis not present

## 2014-06-20 DIAGNOSIS — E1140 Type 2 diabetes mellitus with diabetic neuropathy, unspecified: Secondary | ICD-10-CM | POA: Diagnosis not present

## 2014-06-20 DIAGNOSIS — E1165 Type 2 diabetes mellitus with hyperglycemia: Secondary | ICD-10-CM | POA: Diagnosis not present

## 2014-06-20 DIAGNOSIS — G8929 Other chronic pain: Secondary | ICD-10-CM | POA: Diagnosis not present

## 2014-06-20 DIAGNOSIS — F329 Major depressive disorder, single episode, unspecified: Secondary | ICD-10-CM | POA: Diagnosis not present

## 2014-06-20 DIAGNOSIS — M6281 Muscle weakness (generalized): Secondary | ICD-10-CM | POA: Diagnosis not present

## 2014-06-20 DIAGNOSIS — J449 Chronic obstructive pulmonary disease, unspecified: Secondary | ICD-10-CM | POA: Diagnosis not present

## 2014-06-20 DIAGNOSIS — F411 Generalized anxiety disorder: Secondary | ICD-10-CM | POA: Diagnosis not present

## 2014-06-20 DIAGNOSIS — I2510 Atherosclerotic heart disease of native coronary artery without angina pectoris: Secondary | ICD-10-CM | POA: Diagnosis not present

## 2014-06-20 DIAGNOSIS — R2689 Other abnormalities of gait and mobility: Secondary | ICD-10-CM | POA: Diagnosis not present

## 2014-06-20 LAB — POCT INR: INR: 1.9

## 2014-06-21 ENCOUNTER — Telehealth: Payer: Self-pay | Admitting: Family Medicine

## 2014-06-21 NOTE — Telephone Encounter (Signed)
Need verbal order for home therapy.  Please contact at earliest convenience.

## 2014-06-22 DIAGNOSIS — R2689 Other abnormalities of gait and mobility: Secondary | ICD-10-CM | POA: Diagnosis not present

## 2014-06-22 DIAGNOSIS — G8929 Other chronic pain: Secondary | ICD-10-CM | POA: Diagnosis not present

## 2014-06-22 DIAGNOSIS — I2510 Atherosclerotic heart disease of native coronary artery without angina pectoris: Secondary | ICD-10-CM | POA: Diagnosis not present

## 2014-06-22 DIAGNOSIS — F329 Major depressive disorder, single episode, unspecified: Secondary | ICD-10-CM | POA: Diagnosis not present

## 2014-06-22 DIAGNOSIS — E1140 Type 2 diabetes mellitus with diabetic neuropathy, unspecified: Secondary | ICD-10-CM | POA: Diagnosis not present

## 2014-06-22 DIAGNOSIS — I5022 Chronic systolic (congestive) heart failure: Secondary | ICD-10-CM | POA: Diagnosis not present

## 2014-06-22 DIAGNOSIS — F411 Generalized anxiety disorder: Secondary | ICD-10-CM | POA: Diagnosis not present

## 2014-06-22 DIAGNOSIS — J449 Chronic obstructive pulmonary disease, unspecified: Secondary | ICD-10-CM | POA: Diagnosis not present

## 2014-06-22 DIAGNOSIS — E1165 Type 2 diabetes mellitus with hyperglycemia: Secondary | ICD-10-CM | POA: Diagnosis not present

## 2014-06-22 DIAGNOSIS — M6281 Muscle weakness (generalized): Secondary | ICD-10-CM | POA: Diagnosis not present

## 2014-06-22 NOTE — Telephone Encounter (Signed)
Attempted to contact Evelina Dun of [facility] [place]. No answer.

## 2014-06-23 ENCOUNTER — Other Ambulatory Visit: Payer: Self-pay | Admitting: Internal Medicine

## 2014-06-23 DIAGNOSIS — M6281 Muscle weakness (generalized): Secondary | ICD-10-CM | POA: Diagnosis not present

## 2014-06-23 DIAGNOSIS — E1140 Type 2 diabetes mellitus with diabetic neuropathy, unspecified: Secondary | ICD-10-CM | POA: Diagnosis not present

## 2014-06-23 DIAGNOSIS — I5022 Chronic systolic (congestive) heart failure: Secondary | ICD-10-CM | POA: Diagnosis not present

## 2014-06-23 DIAGNOSIS — F411 Generalized anxiety disorder: Secondary | ICD-10-CM | POA: Diagnosis not present

## 2014-06-23 DIAGNOSIS — J449 Chronic obstructive pulmonary disease, unspecified: Secondary | ICD-10-CM | POA: Diagnosis not present

## 2014-06-23 DIAGNOSIS — R2689 Other abnormalities of gait and mobility: Secondary | ICD-10-CM | POA: Diagnosis not present

## 2014-06-23 DIAGNOSIS — F329 Major depressive disorder, single episode, unspecified: Secondary | ICD-10-CM | POA: Diagnosis not present

## 2014-06-23 DIAGNOSIS — G8929 Other chronic pain: Secondary | ICD-10-CM | POA: Diagnosis not present

## 2014-06-23 DIAGNOSIS — I2510 Atherosclerotic heart disease of native coronary artery without angina pectoris: Secondary | ICD-10-CM | POA: Diagnosis not present

## 2014-06-23 DIAGNOSIS — R296 Repeated falls: Secondary | ICD-10-CM

## 2014-06-23 DIAGNOSIS — E1165 Type 2 diabetes mellitus with hyperglycemia: Secondary | ICD-10-CM | POA: Diagnosis not present

## 2014-06-23 HISTORY — DX: Repeated falls: R29.6

## 2014-06-23 NOTE — Telephone Encounter (Signed)
Attempted to contact Evelina Dun, no answer, left message to return my call.

## 2014-06-26 ENCOUNTER — Telehealth: Payer: Self-pay | Admitting: Family Medicine

## 2014-06-26 DIAGNOSIS — M6281 Muscle weakness (generalized): Secondary | ICD-10-CM | POA: Diagnosis not present

## 2014-06-26 DIAGNOSIS — J449 Chronic obstructive pulmonary disease, unspecified: Secondary | ICD-10-CM | POA: Diagnosis not present

## 2014-06-26 DIAGNOSIS — E1165 Type 2 diabetes mellitus with hyperglycemia: Secondary | ICD-10-CM | POA: Diagnosis not present

## 2014-06-26 DIAGNOSIS — F411 Generalized anxiety disorder: Secondary | ICD-10-CM | POA: Diagnosis not present

## 2014-06-26 DIAGNOSIS — I5022 Chronic systolic (congestive) heart failure: Secondary | ICD-10-CM | POA: Diagnosis not present

## 2014-06-26 DIAGNOSIS — I2510 Atherosclerotic heart disease of native coronary artery without angina pectoris: Secondary | ICD-10-CM | POA: Diagnosis not present

## 2014-06-26 DIAGNOSIS — G8929 Other chronic pain: Secondary | ICD-10-CM | POA: Diagnosis not present

## 2014-06-26 DIAGNOSIS — E1140 Type 2 diabetes mellitus with diabetic neuropathy, unspecified: Secondary | ICD-10-CM | POA: Diagnosis not present

## 2014-06-26 DIAGNOSIS — R2689 Other abnormalities of gait and mobility: Secondary | ICD-10-CM | POA: Diagnosis not present

## 2014-06-26 DIAGNOSIS — F329 Major depressive disorder, single episode, unspecified: Secondary | ICD-10-CM | POA: Diagnosis not present

## 2014-06-26 NOTE — Telephone Encounter (Signed)
Calling to let us know pt fell last Saturday, he was not hurt but wanted to let us know

## 2014-06-26 NOTE — Telephone Encounter (Signed)
Requesting verbal orders for home health

## 2014-06-27 ENCOUNTER — Telehealth: Payer: Self-pay | Admitting: Family Medicine

## 2014-06-27 DIAGNOSIS — E1140 Type 2 diabetes mellitus with diabetic neuropathy, unspecified: Secondary | ICD-10-CM | POA: Diagnosis not present

## 2014-06-27 DIAGNOSIS — I2510 Atherosclerotic heart disease of native coronary artery without angina pectoris: Secondary | ICD-10-CM | POA: Diagnosis not present

## 2014-06-27 DIAGNOSIS — I5022 Chronic systolic (congestive) heart failure: Secondary | ICD-10-CM | POA: Diagnosis not present

## 2014-06-27 DIAGNOSIS — F329 Major depressive disorder, single episode, unspecified: Secondary | ICD-10-CM | POA: Diagnosis not present

## 2014-06-27 DIAGNOSIS — J449 Chronic obstructive pulmonary disease, unspecified: Secondary | ICD-10-CM | POA: Diagnosis not present

## 2014-06-27 DIAGNOSIS — R2689 Other abnormalities of gait and mobility: Secondary | ICD-10-CM | POA: Diagnosis not present

## 2014-06-27 DIAGNOSIS — E1165 Type 2 diabetes mellitus with hyperglycemia: Secondary | ICD-10-CM | POA: Diagnosis not present

## 2014-06-27 DIAGNOSIS — M6281 Muscle weakness (generalized): Secondary | ICD-10-CM | POA: Diagnosis not present

## 2014-06-27 DIAGNOSIS — G8929 Other chronic pain: Secondary | ICD-10-CM | POA: Diagnosis not present

## 2014-06-27 DIAGNOSIS — F411 Generalized anxiety disorder: Secondary | ICD-10-CM | POA: Diagnosis not present

## 2014-06-27 NOTE — Telephone Encounter (Signed)
Mallory called and would like to extend the patients OT 2 times for 2 weeks. She would also like to be able to use a Hot Pack on the patients Neck and shoulders before exercise. Please call Mallory at [phone] with the verbal orders, jw

## 2014-06-27 NOTE — Telephone Encounter (Signed)
Spoke with Janett Billow and informed her of below with Sara's assistance. Katharina Caper, Josselin Gaulin D

## 2014-06-27 NOTE — Telephone Encounter (Signed)
Appreciate call.  Is falling worse?  If so, he should make an appointment.

## 2014-06-27 NOTE — Telephone Encounter (Signed)
Needs to verify diagnois of congestive heart failure Also needs most recent set of vital signs

## 2014-06-27 NOTE — Telephone Encounter (Signed)
Please call and OK these orders.

## 2014-06-28 ENCOUNTER — Other Ambulatory Visit: Payer: Self-pay | Admitting: *Deleted

## 2014-06-28 DIAGNOSIS — F329 Major depressive disorder, single episode, unspecified: Secondary | ICD-10-CM | POA: Diagnosis not present

## 2014-06-28 DIAGNOSIS — R2689 Other abnormalities of gait and mobility: Secondary | ICD-10-CM | POA: Diagnosis not present

## 2014-06-28 DIAGNOSIS — M6281 Muscle weakness (generalized): Secondary | ICD-10-CM | POA: Diagnosis not present

## 2014-06-28 DIAGNOSIS — F411 Generalized anxiety disorder: Secondary | ICD-10-CM | POA: Diagnosis not present

## 2014-06-28 DIAGNOSIS — I2510 Atherosclerotic heart disease of native coronary artery without angina pectoris: Secondary | ICD-10-CM | POA: Diagnosis not present

## 2014-06-28 DIAGNOSIS — E1165 Type 2 diabetes mellitus with hyperglycemia: Secondary | ICD-10-CM | POA: Diagnosis not present

## 2014-06-28 DIAGNOSIS — J449 Chronic obstructive pulmonary disease, unspecified: Secondary | ICD-10-CM | POA: Diagnosis not present

## 2014-06-28 DIAGNOSIS — E1140 Type 2 diabetes mellitus with diabetic neuropathy, unspecified: Secondary | ICD-10-CM | POA: Diagnosis not present

## 2014-06-28 DIAGNOSIS — I5022 Chronic systolic (congestive) heart failure: Secondary | ICD-10-CM | POA: Diagnosis not present

## 2014-06-28 DIAGNOSIS — G8929 Other chronic pain: Secondary | ICD-10-CM | POA: Diagnosis not present

## 2014-06-28 MED ORDER — WARFARIN SODIUM 5 MG PO TABS: 5.0000 mg | ORAL_TABLET | Freq: Every day | ORAL | Status: DC

## 2014-06-28 NOTE — Telephone Encounter (Signed)
Gave verbal ok per Dr, Mingo Amber

## 2014-06-30 DIAGNOSIS — F329 Major depressive disorder, single episode, unspecified: Secondary | ICD-10-CM | POA: Diagnosis not present

## 2014-06-30 DIAGNOSIS — M6281 Muscle weakness (generalized): Secondary | ICD-10-CM | POA: Diagnosis not present

## 2014-06-30 DIAGNOSIS — E1165 Type 2 diabetes mellitus with hyperglycemia: Secondary | ICD-10-CM | POA: Diagnosis not present

## 2014-06-30 DIAGNOSIS — F411 Generalized anxiety disorder: Secondary | ICD-10-CM | POA: Diagnosis not present

## 2014-06-30 DIAGNOSIS — E1140 Type 2 diabetes mellitus with diabetic neuropathy, unspecified: Secondary | ICD-10-CM | POA: Diagnosis not present

## 2014-06-30 DIAGNOSIS — I5022 Chronic systolic (congestive) heart failure: Secondary | ICD-10-CM | POA: Diagnosis not present

## 2014-06-30 DIAGNOSIS — J449 Chronic obstructive pulmonary disease, unspecified: Secondary | ICD-10-CM | POA: Diagnosis not present

## 2014-06-30 DIAGNOSIS — G8929 Other chronic pain: Secondary | ICD-10-CM | POA: Diagnosis not present

## 2014-06-30 DIAGNOSIS — I2510 Atherosclerotic heart disease of native coronary artery without angina pectoris: Secondary | ICD-10-CM | POA: Diagnosis not present

## 2014-06-30 DIAGNOSIS — R2689 Other abnormalities of gait and mobility: Secondary | ICD-10-CM | POA: Diagnosis not present

## 2014-07-04 DIAGNOSIS — M6281 Muscle weakness (generalized): Secondary | ICD-10-CM | POA: Diagnosis not present

## 2014-07-04 DIAGNOSIS — R2689 Other abnormalities of gait and mobility: Secondary | ICD-10-CM | POA: Diagnosis not present

## 2014-07-04 DIAGNOSIS — G8929 Other chronic pain: Secondary | ICD-10-CM | POA: Diagnosis not present

## 2014-07-04 DIAGNOSIS — I5022 Chronic systolic (congestive) heart failure: Secondary | ICD-10-CM | POA: Diagnosis not present

## 2014-07-04 DIAGNOSIS — I2510 Atherosclerotic heart disease of native coronary artery without angina pectoris: Secondary | ICD-10-CM | POA: Diagnosis not present

## 2014-07-04 DIAGNOSIS — J449 Chronic obstructive pulmonary disease, unspecified: Secondary | ICD-10-CM | POA: Diagnosis not present

## 2014-07-04 DIAGNOSIS — F329 Major depressive disorder, single episode, unspecified: Secondary | ICD-10-CM | POA: Diagnosis not present

## 2014-07-04 DIAGNOSIS — F411 Generalized anxiety disorder: Secondary | ICD-10-CM | POA: Diagnosis not present

## 2014-07-04 DIAGNOSIS — E1165 Type 2 diabetes mellitus with hyperglycemia: Secondary | ICD-10-CM | POA: Diagnosis not present

## 2014-07-04 DIAGNOSIS — E1140 Type 2 diabetes mellitus with diabetic neuropathy, unspecified: Secondary | ICD-10-CM | POA: Diagnosis not present

## 2014-07-06 ENCOUNTER — Other Ambulatory Visit: Payer: Self-pay | Admitting: Family Medicine

## 2014-07-06 DIAGNOSIS — G8929 Other chronic pain: Secondary | ICD-10-CM | POA: Diagnosis not present

## 2014-07-06 DIAGNOSIS — R2689 Other abnormalities of gait and mobility: Secondary | ICD-10-CM | POA: Diagnosis not present

## 2014-07-06 DIAGNOSIS — E1165 Type 2 diabetes mellitus with hyperglycemia: Secondary | ICD-10-CM | POA: Diagnosis not present

## 2014-07-06 DIAGNOSIS — M6281 Muscle weakness (generalized): Secondary | ICD-10-CM | POA: Diagnosis not present

## 2014-07-06 DIAGNOSIS — J449 Chronic obstructive pulmonary disease, unspecified: Secondary | ICD-10-CM | POA: Diagnosis not present

## 2014-07-06 DIAGNOSIS — F411 Generalized anxiety disorder: Secondary | ICD-10-CM | POA: Diagnosis not present

## 2014-07-06 DIAGNOSIS — I2510 Atherosclerotic heart disease of native coronary artery without angina pectoris: Secondary | ICD-10-CM | POA: Diagnosis not present

## 2014-07-06 DIAGNOSIS — E1140 Type 2 diabetes mellitus with diabetic neuropathy, unspecified: Secondary | ICD-10-CM | POA: Diagnosis not present

## 2014-07-06 DIAGNOSIS — I5022 Chronic systolic (congestive) heart failure: Secondary | ICD-10-CM | POA: Diagnosis not present

## 2014-07-06 DIAGNOSIS — F329 Major depressive disorder, single episode, unspecified: Secondary | ICD-10-CM | POA: Diagnosis not present

## 2014-07-06 MED ORDER — ACETAMINOPHEN-CODEINE #4 300-60 MG PO TABS: 1.0000 | ORAL_TABLET | Freq: Three times a day (TID) | ORAL | Status: DC | PRN

## 2014-07-06 MED ORDER — LORAZEPAM 0.5 MG PO TABS: 0.2500 mg | ORAL_TABLET | Freq: Every day | ORAL | Status: DC

## 2014-07-06 NOTE — Progress Notes (Signed)
Saw Butch Penny, patient's wife, in clinic for an appointment.  She mentioned that Ed was out of his pain and anxiety medications.  Will send this in now.  Printed and put in "to be faxed" box.

## 2014-07-10 ENCOUNTER — Other Ambulatory Visit: Payer: Self-pay | Admitting: *Deleted

## 2014-07-10 DIAGNOSIS — J449 Chronic obstructive pulmonary disease, unspecified: Secondary | ICD-10-CM | POA: Diagnosis not present

## 2014-07-10 DIAGNOSIS — I2510 Atherosclerotic heart disease of native coronary artery without angina pectoris: Secondary | ICD-10-CM | POA: Diagnosis not present

## 2014-07-10 DIAGNOSIS — F329 Major depressive disorder, single episode, unspecified: Secondary | ICD-10-CM | POA: Diagnosis not present

## 2014-07-10 DIAGNOSIS — G8929 Other chronic pain: Secondary | ICD-10-CM | POA: Diagnosis not present

## 2014-07-10 DIAGNOSIS — F411 Generalized anxiety disorder: Secondary | ICD-10-CM | POA: Diagnosis not present

## 2014-07-10 DIAGNOSIS — M6281 Muscle weakness (generalized): Secondary | ICD-10-CM | POA: Diagnosis not present

## 2014-07-10 DIAGNOSIS — I5022 Chronic systolic (congestive) heart failure: Secondary | ICD-10-CM | POA: Diagnosis not present

## 2014-07-10 DIAGNOSIS — R2689 Other abnormalities of gait and mobility: Secondary | ICD-10-CM | POA: Diagnosis not present

## 2014-07-10 DIAGNOSIS — E1165 Type 2 diabetes mellitus with hyperglycemia: Secondary | ICD-10-CM | POA: Diagnosis not present

## 2014-07-10 DIAGNOSIS — E1140 Type 2 diabetes mellitus with diabetic neuropathy, unspecified: Secondary | ICD-10-CM | POA: Diagnosis not present

## 2014-07-10 MED ORDER — MIRTAZAPINE 15 MG PO TABS: 15.0000 mg | ORAL_TABLET | Freq: Every day | ORAL | Status: DC

## 2014-07-10 MED ORDER — TORSEMIDE 20 MG PO TABS: ORAL_TABLET | ORAL | Status: DC

## 2014-07-10 MED ORDER — LISINOPRIL 5 MG PO TABS: 2.5000 mg | ORAL_TABLET | Freq: Every day | ORAL | Status: DC

## 2014-07-11 ENCOUNTER — Telehealth: Payer: Self-pay | Admitting: Family Medicine

## 2014-07-11 DIAGNOSIS — F411 Generalized anxiety disorder: Secondary | ICD-10-CM | POA: Diagnosis not present

## 2014-07-11 DIAGNOSIS — I5022 Chronic systolic (congestive) heart failure: Secondary | ICD-10-CM | POA: Diagnosis not present

## 2014-07-11 DIAGNOSIS — R2689 Other abnormalities of gait and mobility: Secondary | ICD-10-CM | POA: Diagnosis not present

## 2014-07-11 DIAGNOSIS — I2510 Atherosclerotic heart disease of native coronary artery without angina pectoris: Secondary | ICD-10-CM | POA: Diagnosis not present

## 2014-07-11 DIAGNOSIS — M6281 Muscle weakness (generalized): Secondary | ICD-10-CM | POA: Diagnosis not present

## 2014-07-11 DIAGNOSIS — E1165 Type 2 diabetes mellitus with hyperglycemia: Secondary | ICD-10-CM | POA: Diagnosis not present

## 2014-07-11 DIAGNOSIS — F329 Major depressive disorder, single episode, unspecified: Secondary | ICD-10-CM | POA: Diagnosis not present

## 2014-07-11 DIAGNOSIS — G8929 Other chronic pain: Secondary | ICD-10-CM | POA: Diagnosis not present

## 2014-07-11 DIAGNOSIS — E1140 Type 2 diabetes mellitus with diabetic neuropathy, unspecified: Secondary | ICD-10-CM | POA: Diagnosis not present

## 2014-07-11 DIAGNOSIS — J449 Chronic obstructive pulmonary disease, unspecified: Secondary | ICD-10-CM | POA: Diagnosis not present

## 2014-07-11 NOTE — Telephone Encounter (Signed)
Calling to f/u from last week, says she called to report a fall pt had on 4/8, never heard back. Also wants to report that pt is in chronic pain, especially during OT, is currently prescribed tylenol #4 but isn't having any relief, Mallory wasn't sure if MD wanted to change that.

## 2014-07-13 ENCOUNTER — Other Ambulatory Visit: Payer: Self-pay | Admitting: Internal Medicine

## 2014-07-13 DIAGNOSIS — M6281 Muscle weakness (generalized): Secondary | ICD-10-CM | POA: Diagnosis not present

## 2014-07-13 DIAGNOSIS — I5022 Chronic systolic (congestive) heart failure: Secondary | ICD-10-CM | POA: Diagnosis not present

## 2014-07-13 DIAGNOSIS — F411 Generalized anxiety disorder: Secondary | ICD-10-CM | POA: Diagnosis not present

## 2014-07-13 DIAGNOSIS — F329 Major depressive disorder, single episode, unspecified: Secondary | ICD-10-CM | POA: Diagnosis not present

## 2014-07-13 DIAGNOSIS — J449 Chronic obstructive pulmonary disease, unspecified: Secondary | ICD-10-CM | POA: Diagnosis not present

## 2014-07-13 DIAGNOSIS — R2689 Other abnormalities of gait and mobility: Secondary | ICD-10-CM | POA: Diagnosis not present

## 2014-07-13 DIAGNOSIS — E1165 Type 2 diabetes mellitus with hyperglycemia: Secondary | ICD-10-CM | POA: Diagnosis not present

## 2014-07-13 DIAGNOSIS — G8929 Other chronic pain: Secondary | ICD-10-CM | POA: Diagnosis not present

## 2014-07-13 DIAGNOSIS — E1140 Type 2 diabetes mellitus with diabetic neuropathy, unspecified: Secondary | ICD-10-CM | POA: Diagnosis not present

## 2014-07-13 DIAGNOSIS — I2510 Atherosclerotic heart disease of native coronary artery without angina pectoris: Secondary | ICD-10-CM | POA: Diagnosis not present

## 2014-07-13 NOTE — Telephone Encounter (Signed)
Needs to be seen if still falling and in that much pain.  Please have Ed schedule an appt to see me.

## 2014-07-13 NOTE — Telephone Encounter (Signed)
LVM for Mallory/[facility] to return call to inform her of below. Katharina Caper, April D

## 2014-07-13 NOTE — Telephone Encounter (Signed)
Spoke with Mallory/[facility] and she stated that she was on her way to see the pt and would inform him that he needs to make an appt with dr. Katharina Caper, Kaycen Whitworth D

## 2014-07-14 ENCOUNTER — Other Ambulatory Visit: Payer: Self-pay | Admitting: *Deleted

## 2014-07-14 ENCOUNTER — Telehealth: Payer: Self-pay | Admitting: Family Medicine

## 2014-07-14 DIAGNOSIS — I5022 Chronic systolic (congestive) heart failure: Secondary | ICD-10-CM | POA: Diagnosis not present

## 2014-07-14 DIAGNOSIS — G8929 Other chronic pain: Secondary | ICD-10-CM | POA: Diagnosis not present

## 2014-07-14 DIAGNOSIS — E1140 Type 2 diabetes mellitus with diabetic neuropathy, unspecified: Secondary | ICD-10-CM | POA: Diagnosis not present

## 2014-07-14 DIAGNOSIS — I2510 Atherosclerotic heart disease of native coronary artery without angina pectoris: Secondary | ICD-10-CM | POA: Diagnosis not present

## 2014-07-14 DIAGNOSIS — J449 Chronic obstructive pulmonary disease, unspecified: Secondary | ICD-10-CM | POA: Diagnosis not present

## 2014-07-14 DIAGNOSIS — M6281 Muscle weakness (generalized): Secondary | ICD-10-CM | POA: Diagnosis not present

## 2014-07-14 DIAGNOSIS — E1165 Type 2 diabetes mellitus with hyperglycemia: Secondary | ICD-10-CM | POA: Diagnosis not present

## 2014-07-14 DIAGNOSIS — R2689 Other abnormalities of gait and mobility: Secondary | ICD-10-CM | POA: Diagnosis not present

## 2014-07-14 DIAGNOSIS — F329 Major depressive disorder, single episode, unspecified: Secondary | ICD-10-CM | POA: Diagnosis not present

## 2014-07-14 DIAGNOSIS — F411 Generalized anxiety disorder: Secondary | ICD-10-CM | POA: Diagnosis not present

## 2014-07-14 MED ORDER — MECLIZINE HCL 25 MG PO TABS: ORAL_TABLET | ORAL | Status: DC

## 2014-07-14 NOTE — Telephone Encounter (Signed)
Mr. Garnet Koyanagi the Phys Therapist reporting that Mrs. Wiechmann has had several falls within last week. Also had one this am.  Reporting sob and vertigo.  Out of meds.  Unknown which one.  Pt mentioned that he would like to be placed in a nursing facility.  Need Social Worker consult also.

## 2014-07-14 NOTE — Telephone Encounter (Signed)
LVM for pt to call back to inform him of message below. Katharina Caper, [name]

## 2014-07-14 NOTE — Telephone Encounter (Signed)
As per the note from earlier this week, [name] needs to come in and be seen by someone.  If he continues to fall, he should be seen at the ER.  Please call and communicate this with him.  Thanks!

## 2014-07-17 ENCOUNTER — Encounter (HOSPITAL_COMMUNITY): Payer: Self-pay | Admitting: Emergency Medicine

## 2014-07-17 ENCOUNTER — Emergency Department (HOSPITAL_COMMUNITY): Payer: Medicare Other

## 2014-07-17 ENCOUNTER — Emergency Department (HOSPITAL_COMMUNITY)
Admission: EM | Admit: 2014-07-17 | Discharge: 2014-07-17 | Disposition: A | Discharge status: Home / Self Care | Payer: Medicare Other | Attending: Emergency Medicine | Admitting: Emergency Medicine

## 2014-07-17 DIAGNOSIS — Z9889 Other specified postprocedural states: Secondary | ICD-10-CM | POA: Insufficient documentation

## 2014-07-17 DIAGNOSIS — I252 Old myocardial infarction: Secondary | ICD-10-CM | POA: Diagnosis not present

## 2014-07-17 DIAGNOSIS — E1140 Type 2 diabetes mellitus with diabetic neuropathy, unspecified: Secondary | ICD-10-CM | POA: Diagnosis not present

## 2014-07-17 DIAGNOSIS — S098XXA Other specified injuries of head, initial encounter: Secondary | ICD-10-CM | POA: Diagnosis not present

## 2014-07-17 DIAGNOSIS — G4730 Sleep apnea, unspecified: Secondary | ICD-10-CM | POA: Diagnosis not present

## 2014-07-17 DIAGNOSIS — Z9981 Dependence on supplemental oxygen: Secondary | ICD-10-CM | POA: Diagnosis not present

## 2014-07-17 DIAGNOSIS — F419 Anxiety disorder, unspecified: Secondary | ICD-10-CM | POA: Insufficient documentation

## 2014-07-17 DIAGNOSIS — Z7901 Long term (current) use of anticoagulants: Secondary | ICD-10-CM | POA: Diagnosis not present

## 2014-07-17 DIAGNOSIS — Z87891 Personal history of nicotine dependence: Secondary | ICD-10-CM | POA: Insufficient documentation

## 2014-07-17 DIAGNOSIS — E785 Hyperlipidemia, unspecified: Secondary | ICD-10-CM | POA: Insufficient documentation

## 2014-07-17 DIAGNOSIS — Y998 Other external cause status: Secondary | ICD-10-CM | POA: Diagnosis not present

## 2014-07-17 DIAGNOSIS — Y9289 Other specified places as the place of occurrence of the external cause: Secondary | ICD-10-CM | POA: Diagnosis not present

## 2014-07-17 DIAGNOSIS — Z8614 Personal history of Methicillin resistant Staphylococcus aureus infection: Secondary | ICD-10-CM | POA: Diagnosis not present

## 2014-07-17 DIAGNOSIS — Z86711 Personal history of pulmonary embolism: Secondary | ICD-10-CM | POA: Diagnosis not present

## 2014-07-17 DIAGNOSIS — Z23 Encounter for immunization: Secondary | ICD-10-CM | POA: Insufficient documentation

## 2014-07-17 DIAGNOSIS — Z79899 Other long term (current) drug therapy: Secondary | ICD-10-CM | POA: Insufficient documentation

## 2014-07-17 DIAGNOSIS — I509 Heart failure, unspecified: Secondary | ICD-10-CM | POA: Diagnosis not present

## 2014-07-17 DIAGNOSIS — N189 Chronic kidney disease, unspecified: Secondary | ICD-10-CM | POA: Diagnosis not present

## 2014-07-17 DIAGNOSIS — Z9861 Coronary angioplasty status: Secondary | ICD-10-CM | POA: Diagnosis not present

## 2014-07-17 DIAGNOSIS — R296 Repeated falls: Secondary | ICD-10-CM | POA: Diagnosis not present

## 2014-07-17 DIAGNOSIS — Z8673 Personal history of transient ischemic attack (TIA), and cerebral infarction without residual deficits: Secondary | ICD-10-CM | POA: Insufficient documentation

## 2014-07-17 DIAGNOSIS — Y9389 Activity, other specified: Secondary | ICD-10-CM | POA: Insufficient documentation

## 2014-07-17 DIAGNOSIS — F329 Major depressive disorder, single episode, unspecified: Secondary | ICD-10-CM | POA: Diagnosis not present

## 2014-07-17 DIAGNOSIS — Z951 Presence of aortocoronary bypass graft: Secondary | ICD-10-CM | POA: Diagnosis not present

## 2014-07-17 DIAGNOSIS — W19XXXA Unspecified fall, initial encounter: Secondary | ICD-10-CM

## 2014-07-17 DIAGNOSIS — G8929 Other chronic pain: Secondary | ICD-10-CM | POA: Diagnosis not present

## 2014-07-17 DIAGNOSIS — W1839XA Other fall on same level, initial encounter: Secondary | ICD-10-CM | POA: Diagnosis not present

## 2014-07-17 DIAGNOSIS — S0181XA Laceration without foreign body of other part of head, initial encounter: Secondary | ICD-10-CM | POA: Diagnosis not present

## 2014-07-17 DIAGNOSIS — I129 Hypertensive chronic kidney disease with stage 1 through stage 4 chronic kidney disease, or unspecified chronic kidney disease: Secondary | ICD-10-CM | POA: Diagnosis not present

## 2014-07-17 DIAGNOSIS — J449 Chronic obstructive pulmonary disease, unspecified: Secondary | ICD-10-CM | POA: Insufficient documentation

## 2014-07-17 DIAGNOSIS — R000 Tachycardia, unspecified: Secondary | ICD-10-CM | POA: Insufficient documentation

## 2014-07-17 DIAGNOSIS — I25119 Atherosclerotic heart disease of native coronary artery with unspecified angina pectoris: Secondary | ICD-10-CM | POA: Diagnosis not present

## 2014-07-17 DIAGNOSIS — E669 Obesity, unspecified: Secondary | ICD-10-CM | POA: Diagnosis not present

## 2014-07-17 DIAGNOSIS — Z794 Long term (current) use of insulin: Secondary | ICD-10-CM | POA: Insufficient documentation

## 2014-07-17 DIAGNOSIS — N400 Enlarged prostate without lower urinary tract symptoms: Secondary | ICD-10-CM | POA: Insufficient documentation

## 2014-07-17 DIAGNOSIS — S0993XA Unspecified injury of face, initial encounter: Secondary | ICD-10-CM | POA: Diagnosis present

## 2014-07-17 DIAGNOSIS — K219 Gastro-esophageal reflux disease without esophagitis: Secondary | ICD-10-CM | POA: Insufficient documentation

## 2014-07-17 DIAGNOSIS — R51 Headache: Secondary | ICD-10-CM | POA: Diagnosis not present

## 2014-07-17 DIAGNOSIS — S0121XA Laceration without foreign body of nose, initial encounter: Secondary | ICD-10-CM | POA: Diagnosis not present

## 2014-07-17 LAB — CBC WITH DIFFERENTIAL/PLATELET
BASOS ABS: 0 10*3/uL (ref 0.0–0.1)
Basophils Relative: 0 % (ref 0–1)
EOS ABS: 0.2 10*3/uL (ref 0.0–0.7)
Eosinophils Relative: 2 % (ref 0–5)
HCT: 40.1 % (ref 39.0–52.0)
HEMOGLOBIN: 13.2 g/dL (ref 13.0–17.0)
LYMPHS ABS: 1.8 10*3/uL (ref 0.7–4.0)
Lymphocytes Relative: 18 % (ref 12–46)
MCH: 26.7 pg (ref 26.0–34.0)
MCHC: 32.9 g/dL (ref 30.0–36.0)
MCV: 81.2 fL (ref 78.0–100.0)
MONOS PCT: 10 % (ref 3–12)
Monocytes Absolute: 1 10*3/uL (ref 0.1–1.0)
Neutro Abs: 7.2 10*3/uL (ref 1.7–7.7)
Neutrophils Relative %: 70 % (ref 43–77)
Platelets: 168 10*3/uL (ref 150–400)
RBC: 4.94 MIL/uL (ref 4.22–5.81)
RDW: 16.2 % — ABNORMAL HIGH (ref 11.5–15.5)
WBC: 10.3 10*3/uL (ref 4.0–10.5)

## 2014-07-17 LAB — BASIC METABOLIC PANEL
Anion gap: 12 (ref 5–15)
BUN: 17 mg/dL (ref 6–23)
CHLORIDE: 102 mmol/L (ref 96–112)
CO2: 23 mmol/L (ref 19–32)
CREATININE: 1.11 mg/dL (ref 0.50–1.35)
Calcium: 8.8 mg/dL (ref 8.4–10.5)
GFR calc non Af Amer: 63 mL/min — ABNORMAL LOW (ref >90)
GFR, EST AFRICAN AMERICAN: 73 mL/min — AB (ref >90)
GLUCOSE: 233 mg/dL — AB (ref 70–99)
Potassium: 3.7 mmol/L (ref 3.5–5.1)
SODIUM: 137 mmol/L (ref 135–145)

## 2014-07-17 LAB — PROTIME-INR
INR: 2.49 — AB (ref 0.00–1.49)
PROTHROMBIN TIME: 27.1 s — AB (ref 11.6–15.2)

## 2014-07-17 MED ORDER — TETANUS-DIPHTH-ACELL PERTUSSIS 5-2.5-18.5 LF-MCG/0.5 IM SUSP
0.5000 mL | Freq: Once | INTRAMUSCULAR | Status: AC
  Administered 2014-07-17: 0.5 mL via INTRAMUSCULAR
  Filled 2014-07-17: qty 0.5

## 2014-07-17 MED ORDER — HYDROMORPHONE HCL 1 MG/ML IJ SOLN
1.0000 mg | Freq: Once | INTRAMUSCULAR | Status: AC
Start: 2014-07-17 — End: 2014-07-17
  Administered 2014-07-17: 1 mg via INTRAMUSCULAR
  Filled 2014-07-17: qty 1

## 2014-07-17 NOTE — ED Notes (Signed)
From home via [facility], mechanical fall with no LOC, no trauma, c/o pain to forehead, hx of chronic neck/back pain, no c-collar on arrival,   105/70 109 CBG 242

## 2014-07-17 NOTE — Telephone Encounter (Signed)
Spoke with pt wife and she stated that he is falling a lot and that she had have EMS carry him to hospital this AM, I gave her the message below and we made an appt for him to come in and see Dr. Mingo Amber on 07/18/2014. Katharina Caper, Olaf Mesa D

## 2014-07-17 NOTE — Discharge Instructions (Signed)
Fall Prevention and Home Safety [name], your CT scans did not show any significant injury. See your primary care physician within 3 days for close follow-up. If symptoms worsen come back to emergency department immediately. Thank you. Falls cause injuries and can affect all age groups. It is possible to prevent falls.  HOW TO PREVENT FALLS  Wear shoes with rubber soles that do not have an opening for your toes.  Keep the inside and outside of your house well lit.  Use night lights throughout your home.  Remove clutter from floors.  Clean up floor spills.  Remove throw rugs or fasten them to the floor with carpet tape.  Do not place electrical cords across pathways.  Put grab bars by your tub, shower, and toilet. Do not use towel bars as grab bars.  Put handrails on both sides of the stairway. Fix loose handrails.  Do not climb on stools or stepladders, if possible.  Do not wax your floors.  Repair uneven or unsafe sidewalks, walkways, or stairs.  Keep items you use a lot within reach.  Be aware of pets.  Keep emergency numbers next to the telephone.  Put smoke detectors in your home and near bedrooms. Ask your doctor what other things you can do to prevent falls. Document Released: 01/04/2009 Document Revised: 09/09/2011 Document Reviewed: 06/10/2011 [facility] Patient Information 2015 [place], [place]. This information is not intended to replace advice given to you by your health care provider. Make sure you discuss any questions you have with your health care provider.

## 2014-07-17 NOTE — ED Provider Notes (Signed)
CSN: [id number]     Arrival date & time 07/17/14  0448 History   First MD Initiated Contact with Patient 07/17/14 0457     Chief Complaint  Patient presents with  . Fall     (Consider location/radiation/quality/duration/timing/severity/associated sxs/prior Treatment) HPI  [name] is a 74 y.o. male with past medical history of diabetes, COPD, hyperlipidemia, pulmonary embolism on Coumadin presenting today after a fall. Patient states this is a mechanical fall as he was turning on his walker. He denies any prodromal symptoms. There's no chest pain, headache, abdominal pain or back pain. This occurred immediately prior to arrival. He is currently having pain over his forehead and nose. He is a small laceration of his nose. Patient also has chronic back pain which has not worsened. He denies being on the floor for a long period of time. He has no further complaints.  10 Systems reviewed and are negative for acute change except as noted in the HPI.     Past Medical History  Diagnosis Date  . Depression   . Diabetes mellitus   . COPD (chronic obstructive pulmonary disease)   . Adhesive capsulitis   . MRSA bacteremia     2011 - possible endocarditis, received 6 weeks IV treatment  . Hyperlipidemia   . Anxiety   . GERD (gastroesophageal reflux disease)   . CAD (coronary artery disease)   . 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 CVA (cerebral infarction) Questionable history  . Diverticulosis   . Osteomyelitis 2012    Sternoclavicular joint   . Chronic back pain   . Angina   . Myocardial infarction   . CHF (congestive heart failure)   . Shortness of breath   . Sleep apnea   . Chronic kidney disease     hx of BPH  . Neuromuscular disorder     HX of diabetic periferal neuropathy  . Hypertension   . Peripheral vascular disease   . On home oxygen therapy     "1.5L prn" (04/12/2014)   Past Surgical History  Procedure Laterality Date  . Septic  arthritis      Removal of infected CABG wire by Dr Arlyce Dice - 2011  . Coronary artery bypass graft  1992  . Fracture surgery  1980s    Hip  . Spine surgery  2002    Cervical fusion vertebroplasty C3-4-5  . Coronary stent placement  1999, 2002  . Doppler echocardiography  Sept 2011    EF 50-55% with some impaired diastolic relaxation  . Colonoscopy  2006     Multiple polyps removed, repeat in 5 years  . Right iliopopliteal bypass - [place]  . Femoral-popliteal bypass graft  2008    Left  . Cardiac catheterization  October 18, 2010    Known obstructive disease, no further blockages  . Cardiac catheterization  03/17/2005    EF 35-40%  . US echocardiography  03/04/2006    EF 50-55%  . Amputation  03/16/2011    Procedure: AMPUTATION DIGIT;  Surgeon: Angelia Mould, MD;  Location: [facility] OR;  Service: Vascular;  Laterality: Left;  Great toe   Family History  Problem Relation Age of Onset  . Heart disease Father   . Heart disease Mother    History  Substance Use Topics  . Smoking status: Former Smoker    Quit date: 06/19/2000  . Smokeless tobacco: Never Used  . Alcohol Use: No    Review  of Systems    Allergies  Vancomycin and Diazepam  Home Medications   Prior to Admission medications   Medication Sig Start Date End Date Taking? Authorizing Provider  Acetaminophen (TYLENOL ARTHRITIS PAIN PO) Take 1-2 tablets by mouth 2 (two) times daily as needed (for arthritis pain).    Historical Provider, MD  acetaminophen-codeine (TYLENOL #4) 300-60 MG per tablet Take 1 tablet by mouth every 8 (eight) hours as needed for moderate pain. 07/06/14   Alveda Reasons, MD  atorvastatin (LIPITOR) 40 MG tablet Take 1 tablet (40 mg total) by mouth daily at 6 PM. 05/11/14   Aquilla Hacker, MD  bisoprolol (ZEBETA) 5 MG tablet Take 0.5 tablets (2.5 mg total) by mouth daily. 05/10/14   Alveda Reasons, MD  Blood Glucose Monitoring Suppl (ONE TOUCH ULTRA 2) W/DEVICE KIT Please supply patient with  1 glucometer device to use 4 times daily to check blood sugars.  Dx code: 250.0 10/19/13   Alveda Reasons, MD  glucose blood test strip Use as instructed.  One Step Ultra 2 strips. 02/20/14   Alveda Reasons, MD  insulin aspart (NOVOLOG) 100 UNIT/ML injection Use sliding scale as prescribed:  0 - 150 0 units; 151 - 200 2 units, 201 - 250 4 units; 251 - 200 6 units, 301 - 350 8 units 06/06/14   Alveda Reasons, MD  insulin NPH Human (HUMULIN N,NOVOLIN N) 100 UNIT/ML injection Inject 0.15 mLs (15 Units total) into the skin 2 (two) times daily before a meal. 06/06/14   Alveda Reasons, MD  Insulin Syringe-Needle U-100 (B-D INS SYRINGE 0.5CC/30GX1/2") 30G X 1/2" 0.5 ML MISC For use with insulin 01/20/14   Alveda Reasons, MD  lidocaine (LIDODERM) 5 % Place 1 patch onto the skin daily. Remove & Discard patch within 12 hours or as directed by MD 05/05/14   Alveda Reasons, MD  lisinopril (PRINIVIL,ZESTRIL) 5 MG tablet Take 0.5 tablets (2.5 mg total) by mouth daily. 07/10/14   Alveda Reasons, MD  LORazepam (ATIVAN) 0.5 MG tablet Take 0.5 tablets (0.25 mg total) by mouth at bedtime. 07/06/14   Alveda Reasons, MD  meclizine (ANTIVERT) 25 MG tablet TAKE ONE TABLET BY MOUTH THREE TIMES DAILY AS NEEDED FOR  DIZZINESS 07/14/14   Alveda Reasons, MD  mirtazapine (REMERON) 15 MG tablet Take 1 tablet (15 mg total) by mouth at bedtime. 07/10/14   Alveda Reasons, MD  NEEDLE, DISP, 30 G (B-D DISP NEEDLE 30GX1") 30G X 1" MISC For using with insulin. 01/20/14   Alveda Reasons, MD  nitroGLYCERIN (NITROSTAT) 0.4 MG SL tablet Place 1 tablet (0.4 mg total) under the tongue as needed. For chest pains 10/19/13   Alveda Reasons, MD  omeprazole (PRILOSEC) 40 MG capsule Take 1 capsule (40 mg total) by mouth daily. 05/10/14   Alveda Reasons, MD  simvastatin (ZOCOR) 80 MG tablet Take 1 tablet (80 mg total) by mouth daily. 06/06/14   Alveda Reasons, MD  tamsulosin (FLOMAX) 0.4 MG CAPS capsule Take 1 capsule (0.4 mg total)  by mouth daily after supper. 10/19/13   Alveda Reasons, MD  torsemide (DEMADEX) 20 MG tablet TAKE ONE TABLET BY MOUTH EVERY DAY 07/10/14   Alveda Reasons, MD  venlafaxine XR (EFFEXOR-XR) 37.5 MG 24 hr capsule TAKE ONE CAPSULE BY MOUTH ONCE DAILY 05/05/14   Alveda Reasons, MD  warfarin (COUMADIN) 5 MG tablet Take 1 tablet (5 mg total) by mouth daily  at 6 PM. 06/28/14   Alveda Reasons, MD   BP 125/58 mmHg  Pulse 113  Temp(Src) 98.6 F (37 C)  Resp 22  Ht '5\' 8"'  (1.727 m)  Wt 254 lb (115.214 kg)  BMI 38.63 kg/m2  SpO2 94% Physical Exam  Constitutional: He is oriented to person, place, and time. Vital signs are normal. He appears well-developed and well-nourished.  Non-toxic appearance. He does not appear ill. No distress.  Obese male  HENT:  Nose: Nose normal.  Mouth/Throat: Oropharynx is clear and moist. No oropharyngeal exudate.  Irregularly-shaped nasal bridge is tender to palpation. 0.5 cm lacerations seen, it is superficial with no active hemorrhage.  Eyes: Conjunctivae and EOM are normal. Pupils are equal, round, and reactive to light. No scleral icterus.  Neck: Normal range of motion. Neck supple. No tracheal deviation, no edema, no erythema and normal range of motion present. No thyroid mass and no thyromegaly present.  No midline cervical spine tenderness.  Cardiovascular: Regular rhythm, S1 normal, S2 normal, normal heart sounds, intact distal pulses and normal pulses.  Exam reveals no gallop and no friction rub.   No murmur heard. Pulses:      Radial pulses are 2+ on the right side, and 2+ on the left side.       Dorsalis pedis pulses are 2+ on the right side, and 2+ on the left side.  Tachycardic  Pulmonary/Chest: Effort normal and breath sounds normal. No respiratory distress. He has no wheezes. He has no rhonchi. He has no rales.  Abdominal: Soft. Normal appearance and bowel sounds are normal. He exhibits no distension, no ascites and no mass. There is no  hepatosplenomegaly. There is no tenderness. There is no rebound, no guarding and no CVA tenderness.  Musculoskeletal: Normal range of motion. He exhibits no edema or tenderness.  Status post left great toe amputation.  Lymphadenopathy:    He has no cervical adenopathy.  Neurological: He is alert and oriented to person, place, and time. He has normal strength. No cranial nerve deficit or sensory deficit. He exhibits normal muscle tone.  Normal strength and sensation 4 extremities.  Skin: Skin is warm, dry and intact. No petechiae and no rash noted. He is not diaphoretic. No erythema. No pallor.  Psychiatric: He has a normal mood and affect. His behavior is normal. Judgment normal.  Nursing note and vitals reviewed.   ED Course  Procedures (including critical care time) Labs Review Labs Reviewed  CBC WITH DIFFERENTIAL/PLATELET - Abnormal; Notable for the following:    RDW 16.2 (*)    All other components within normal limits  BASIC METABOLIC PANEL - Abnormal; Notable for the following:    Glucose, Bld 233 (*)    GFR calc non Af Amer 63 (*)    GFR calc Af Amer 73 (*)    All other components within normal limits  PROTIME-INR - Abnormal; Notable for the following:    Prothrombin Time 27.1 (*)    INR 2.49 (*)    All other components within normal limits    Imaging Review Ct Head Wo Contrast  07/17/2014   CLINICAL DATA:  Multiple recent falls. Worsening right shoulder pain. Chronic neck pain and diffuse headache. Small right frontal laceration and abrasions at the nose. Initial encounter.  EXAM: CT HEAD WITHOUT CONTRAST  CT MAXILLOFACIAL WITHOUT CONTRAST  CT CERVICAL SPINE WITHOUT CONTRAST  TECHNIQUE: Multidetector CT imaging of the head, cervical spine, and maxillofacial structures were performed using the standard protocol without  intravenous contrast. Multiplanar CT image reconstructions of the cervical spine and maxillofacial structures were also generated.  COMPARISON:  None.   FINDINGS: CT HEAD FINDINGS  There is no evidence of acute infarction, mass lesion, or intra- or extra-axial hemorrhage on CT.  Prominence of the ventricles and sulci reflects moderate cortical volume loss. Mild cerebellar atrophy is noted. Chronic infarct is noted at the left parietal and occipital lobes, with associated encephalomalacia.  The brainstem and fourth ventricle are within normal limits. The basal ganglia are unremarkable in appearance. The cerebral hemispheres demonstrate grossly normal gray-white differentiation. No mass effect or midline shift is seen.  There is no evidence of fracture; visualized osseous structures are unremarkable in appearance. The orbits are within normal limits. The paranasal sinuses and mastoid air cells are well-aerated. No significant soft tissue abnormalities are seen.  CT MAXILLOFACIAL FINDINGS  There is no evidence of fracture or dislocation. The maxilla and mandible appear intact. The nasal bone is unremarkable in appearance. There is complete absence of the dentition.  The orbits are intact bilaterally. The visualized paranasal sinuses and mastoid air cells are well-aerated.  Known soft tissue abrasions at the nose and right frontal region are not well characterized. Calcification is noted along the carotid bifurcations and common carotid arteries. The parapharyngeal fat planes are preserved. The nasopharynx, oropharynx and hypopharynx are unremarkable in appearance. The visualized portions of the valleculae and piriform sinuses are grossly unremarkable.  The parotid and submandibular glands are within normal limits. No cervical lymphadenopathy is seen.  CT CERVICAL SPINE FINDINGS  There is no evidence of acute fracture or subluxation. The patient is status post anterior cervical spinal fusion at C4-C6, with underlying chronic loss of height at C5. Mild underlying degenerative change is seen. Underlying facet disease is noted. Vertebral bodies demonstrate normal  alignment. Prevertebral soft tissues are within normal limits. Chronic small degenerative erosions are seen at the dens.  The thyroid gland is unremarkable in appearance. The visualized lung apices are clear. No significant soft tissue abnormalities are seen.  IMPRESSION: 1. No evidence of traumatic intracranial injury or fracture. 2. No evidence of fracture or dislocation with regard to the maxillofacial structures. 3. No evidence of acute fracture or subluxation along the cervical spine. 4. Moderate cortical volume loss noted. 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 5. Calcification along the carotid bifurcations and common carotid arteries. Carotid ultrasound would be helpful for further evaluation, when and as deemed clinically appropriate. 6. Status post anterior cervical spinal fusion at C4-C6, with underlying chronic loss of height at C5. Mild underlying degenerative change seen.   Electronically Signed   By: Garald Balding M.D.   On: 07/17/2014 06:52   Ct Cervical Spine Wo Contrast  07/17/2014   CLINICAL DATA:  Multiple recent falls. Worsening right shoulder pain. Chronic neck pain and diffuse headache. Small right frontal laceration and abrasions at the nose. Initial encounter.  EXAM: CT HEAD WITHOUT CONTRAST  CT MAXILLOFACIAL WITHOUT CONTRAST  CT CERVICAL SPINE WITHOUT CONTRAST  TECHNIQUE: Multidetector CT imaging of the head, cervical spine, and maxillofacial structures were performed using the standard protocol without intravenous contrast. Multiplanar CT image reconstructions of the cervical spine and maxillofacial structures were also generated.  COMPARISON:  None.  FINDINGS: CT HEAD FINDINGS  There is no evidence of acute infarction, mass lesion, or intra- or extra-axial hemorrhage on CT.  Prominence of the ventricles and sulci reflects moderate cortical volume loss. Mild cerebellar atrophy is noted. Chronic infarct  is noted at the left parietal and  occipital lobes, with associated encephalomalacia.  The brainstem and fourth ventricle are within normal limits. The basal ganglia are unremarkable in appearance. The cerebral hemispheres demonstrate grossly normal gray-white differentiation. No mass effect or midline shift is seen.  There is no evidence of fracture; visualized osseous structures are unremarkable in appearance. The orbits are within normal limits. The paranasal sinuses and mastoid air cells are well-aerated. No significant soft tissue abnormalities are seen.  CT MAXILLOFACIAL FINDINGS  There is no evidence of fracture or dislocation. The maxilla and mandible appear intact. The nasal bone is unremarkable in appearance. There is complete absence of the dentition.  The orbits are intact bilaterally. The visualized paranasal sinuses and mastoid air cells are well-aerated.  Known soft tissue abrasions at the nose and right frontal region are not well characterized. Calcification is noted along the carotid bifurcations and common carotid arteries. The parapharyngeal fat planes are preserved. The nasopharynx, oropharynx and hypopharynx are unremarkable in appearance. The visualized portions of the valleculae and piriform sinuses are grossly unremarkable.  The parotid and submandibular glands are within normal limits. No cervical lymphadenopathy is seen.  CT CERVICAL SPINE FINDINGS  There is no evidence of acute fracture or subluxation. The patient is status post anterior cervical spinal fusion at C4-C6, with underlying chronic loss of height at C5. Mild underlying degenerative change is seen. Underlying facet disease is noted. Vertebral bodies demonstrate normal alignment. Prevertebral soft tissues are within normal limits. Chronic small degenerative erosions are seen at the dens.  The thyroid gland is unremarkable in appearance. The visualized lung apices are clear. No significant soft tissue abnormalities are seen.  IMPRESSION: 1. No evidence of  traumatic intracranial injury or fracture. 2. No evidence of fracture or dislocation with regard to the maxillofacial structures. 3. No evidence of acute fracture or subluxation along the cervical spine. 4. Moderate cortical volume loss noted. 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 5. Calcification along the carotid bifurcations and common carotid arteries. Carotid ultrasound would be helpful for further evaluation, when and as deemed clinically appropriate. 6. Status post anterior cervical spinal fusion at C4-C6, with underlying chronic loss of height at C5. Mild underlying degenerative change seen.   Electronically Signed   By: Garald Balding M.D.   On: 07/17/2014 06:52   Ct Maxillofacial Wo Cm  07/17/2014   CLINICAL DATA:  Multiple recent falls. Worsening right shoulder pain. Chronic neck pain and diffuse headache. Small right frontal laceration and abrasions at the nose. Initial encounter.  EXAM: CT HEAD WITHOUT CONTRAST  CT MAXILLOFACIAL WITHOUT CONTRAST  CT CERVICAL SPINE WITHOUT CONTRAST  TECHNIQUE: Multidetector CT imaging of the head, cervical spine, and maxillofacial structures were performed using the standard protocol without intravenous contrast. Multiplanar CT image reconstructions of the cervical spine and maxillofacial structures were also generated.  COMPARISON:  None.  FINDINGS: CT HEAD FINDINGS  There is no evidence of acute infarction, mass lesion, or intra- or extra-axial hemorrhage on CT.  Prominence of the ventricles and sulci reflects moderate cortical volume loss. Mild cerebellar atrophy is noted. Chronic infarct is noted at the left parietal and occipital lobes, with associated encephalomalacia.  The brainstem and fourth ventricle are within normal limits. The basal ganglia are unremarkable in appearance. The cerebral hemispheres demonstrate grossly normal gray-white differentiation. No mass effect or midline shift is seen.  There is no  evidence of fracture; visualized osseous structures are unremarkable in appearance. The orbits  are within normal limits. The paranasal sinuses and mastoid air cells are well-aerated. No significant soft tissue abnormalities are seen.  CT MAXILLOFACIAL FINDINGS  There is no evidence of fracture or dislocation. The maxilla and mandible appear intact. The nasal bone is unremarkable in appearance. There is complete absence of the dentition.  The orbits are intact bilaterally. The visualized paranasal sinuses and mastoid air cells are well-aerated.  Known soft tissue abrasions at the nose and right frontal region are not well characterized. Calcification is noted along the carotid bifurcations and common carotid arteries. The parapharyngeal fat planes are preserved. The nasopharynx, oropharynx and hypopharynx are unremarkable in appearance. The visualized portions of the valleculae and piriform sinuses are grossly unremarkable.  The parotid and submandibular glands are within normal limits. No cervical lymphadenopathy is seen.  CT CERVICAL SPINE FINDINGS  There is no evidence of acute fracture or subluxation. The patient is status post anterior cervical spinal fusion at C4-C6, with underlying chronic loss of height at C5. Mild underlying degenerative change is seen. Underlying facet disease is noted. Vertebral bodies demonstrate normal alignment. Prevertebral soft tissues are within normal limits. Chronic small degenerative erosions are seen at the dens.  The thyroid gland is unremarkable in appearance. The visualized lung apices are clear. No significant soft tissue abnormalities are seen.  IMPRESSION: 1. No evidence of traumatic intracranial injury or fracture. 2. No evidence of fracture or dislocation with regard to the maxillofacial structures. 3. No evidence of acute fracture or subluxation along the cervical spine. 4. Moderate cortical volume loss noted. Chronic infarct at the left parietal occipital lobes, with  associated encephalomalacia. 5. Calcification along the carotid bifurcations and common carotid arteries. Carotid ultrasound would be helpful for further evaluation, when and as deemed clinically appropriate. 6. Status post anterior cervical spinal fusion at C4-C6, with underlying chronic loss of height at C5. Mild underlying degenerative change seen.   Electronically Signed   By: Garald Balding M.D.   On: 07/17/2014 06:52     EKG Interpretation   Date/Time:  Monday July 17 2014 05:01:39 EDT Ventricular Rate:  109 PR Interval:  130 QRS Duration: 124 QT Interval:  354 QTC Calculation: 477 R Axis:   14 Text Interpretation:  Sinus or ectopic atrial tachycardia Left bundle  branch block Baseline wander in lead(s) V6 Confirmed by Glynn Octave [phone]) on 07/17/2014 5:58:54 AM      MDM   Final diagnoses:  Fall  Fall   Patient presents emergency department after mechanical fall. Will obtain CT scan of head and neck and face as the patient is on blood thinners. Tetanus shot was updated. Patient was given Dilaudid intramuscularly for pain control. Laboratory studies also pending.  Laboratory studies are unremarkable and do not reveal a cause for the fall. EKG is unchanged from previous. CT scan does not show any significant injury. At this time the patient's vital signs remain within his normal limits, tachycardia has resolved spontaneously. Patient is safe for discharge with primary care follow-up within 3 days.    Everlene Balls, MD 07/17/14 [phone]

## 2014-07-17 NOTE — ED Notes (Signed)
Patient transported to CT 

## 2014-07-18 ENCOUNTER — Other Ambulatory Visit: Payer: Self-pay

## 2014-07-18 ENCOUNTER — Observation Stay (HOSPITAL_COMMUNITY)
Admission: AD | Admit: 2014-07-18 | Discharge: 2014-07-21 | Disposition: A | Discharge status: Skilled Nursing Facility | Payer: Medicare Other | Source: Ambulatory Visit | Attending: Family Medicine | Admitting: Family Medicine

## 2014-07-18 ENCOUNTER — Ambulatory Visit: Payer: Medicare Other

## 2014-07-18 ENCOUNTER — Encounter: Payer: Self-pay | Admitting: Family Medicine

## 2014-07-18 ENCOUNTER — Encounter (HOSPITAL_COMMUNITY): Payer: Self-pay | Admitting: General Practice

## 2014-07-18 ENCOUNTER — Ambulatory Visit (INDEPENDENT_AMBULATORY_CARE_PROVIDER_SITE_OTHER): Payer: Medicare Other | Admitting: Family Medicine

## 2014-07-18 VITALS — BP 120/56 | HR 93 | Temp 98.1°F | Ht 68.0 in | Wt 258.2 lb

## 2014-07-18 DIAGNOSIS — I2782 Chronic pulmonary embolism: Secondary | ICD-10-CM | POA: Insufficient documentation

## 2014-07-18 DIAGNOSIS — R2681 Unsteadiness on feet: Secondary | ICD-10-CM | POA: Diagnosis not present

## 2014-07-18 DIAGNOSIS — N400 Enlarged prostate without lower urinary tract symptoms: Secondary | ICD-10-CM | POA: Diagnosis not present

## 2014-07-18 DIAGNOSIS — I5022 Chronic systolic (congestive) heart failure: Secondary | ICD-10-CM

## 2014-07-18 DIAGNOSIS — W19XXXA Unspecified fall, initial encounter: Secondary | ICD-10-CM | POA: Diagnosis not present

## 2014-07-18 DIAGNOSIS — Z7901 Long term (current) use of anticoagulants: Secondary | ICD-10-CM | POA: Diagnosis not present

## 2014-07-18 DIAGNOSIS — J45909 Unspecified asthma, uncomplicated: Secondary | ICD-10-CM | POA: Diagnosis not present

## 2014-07-18 DIAGNOSIS — E860 Dehydration: Secondary | ICD-10-CM

## 2014-07-18 DIAGNOSIS — E1142 Type 2 diabetes mellitus with diabetic polyneuropathy: Secondary | ICD-10-CM | POA: Insufficient documentation

## 2014-07-18 DIAGNOSIS — R197 Diarrhea, unspecified: Secondary | ICD-10-CM | POA: Diagnosis not present

## 2014-07-18 DIAGNOSIS — I10 Essential (primary) hypertension: Secondary | ICD-10-CM | POA: Insufficient documentation

## 2014-07-18 DIAGNOSIS — I951 Orthostatic hypotension: Secondary | ICD-10-CM | POA: Insufficient documentation

## 2014-07-18 DIAGNOSIS — Z89412 Acquired absence of left great toe: Secondary | ICD-10-CM | POA: Diagnosis not present

## 2014-07-18 DIAGNOSIS — Z66 Do not resuscitate: Secondary | ICD-10-CM | POA: Insufficient documentation

## 2014-07-18 DIAGNOSIS — E1151 Type 2 diabetes mellitus with diabetic peripheral angiopathy without gangrene: Secondary | ICD-10-CM | POA: Diagnosis present

## 2014-07-18 DIAGNOSIS — W19XXXS Unspecified fall, sequela: Secondary | ICD-10-CM

## 2014-07-18 DIAGNOSIS — I70209 Unspecified atherosclerosis of native arteries of extremities, unspecified extremity: Secondary | ICD-10-CM

## 2014-07-18 DIAGNOSIS — F329 Major depressive disorder, single episode, unspecified: Secondary | ICD-10-CM | POA: Insufficient documentation

## 2014-07-18 DIAGNOSIS — Z794 Long term (current) use of insulin: Secondary | ICD-10-CM | POA: Diagnosis not present

## 2014-07-18 DIAGNOSIS — R208 Other disturbances of skin sensation: Secondary | ICD-10-CM | POA: Diagnosis not present

## 2014-07-18 DIAGNOSIS — I509 Heart failure, unspecified: Secondary | ICD-10-CM | POA: Diagnosis not present

## 2014-07-18 DIAGNOSIS — R5381 Other malaise: Secondary | ICD-10-CM | POA: Diagnosis not present

## 2014-07-18 DIAGNOSIS — E785 Hyperlipidemia, unspecified: Secondary | ICD-10-CM | POA: Insufficient documentation

## 2014-07-18 DIAGNOSIS — R600 Localized edema: Secondary | ICD-10-CM | POA: Diagnosis not present

## 2014-07-18 HISTORY — DX: Repeated falls: R29.6

## 2014-07-18 LAB — COMPREHENSIVE METABOLIC PANEL
ALT: 14 U/L (ref 0–53)
ANION GAP: 9 (ref 5–15)
AST: 17 U/L (ref 0–37)
Albumin: 3.3 g/dL — ABNORMAL LOW (ref 3.5–5.2)
Alkaline Phosphatase: 58 U/L (ref 39–117)
BUN: 11 mg/dL (ref 6–23)
CO2: 27 mmol/L (ref 19–32)
Calcium: 8.7 mg/dL (ref 8.4–10.5)
Chloride: 102 mmol/L (ref 96–112)
Creatinine, Ser: 0.96 mg/dL (ref 0.50–1.35)
GFR calc Af Amer: >90 mL/min (ref >90)
GFR, EST NON AFRICAN AMERICAN: 79 mL/min — AB (ref >90)
Glucose, Bld: 198 mg/dL — ABNORMAL HIGH (ref 70–99)
Potassium: 3.8 mmol/L (ref 3.5–5.1)
Sodium: 138 mmol/L (ref 135–145)
Total Bilirubin: 0.4 mg/dL (ref 0.3–1.2)
Total Protein: 5.9 g/dL — ABNORMAL LOW (ref 6.0–8.3)

## 2014-07-18 LAB — CBC WITH DIFFERENTIAL/PLATELET
Basophils Absolute: 0 10*3/uL (ref 0.0–0.1)
Basophils Relative: 0 % (ref 0–1)
Eosinophils Absolute: 0.2 10*3/uL (ref 0.0–0.7)
Eosinophils Relative: 2 % (ref 0–5)
HCT: 39 % (ref 39.0–52.0)
HEMOGLOBIN: 12.6 g/dL — AB (ref 13.0–17.0)
LYMPHS PCT: 21 % (ref 12–46)
Lymphs Abs: 2.1 10*3/uL (ref 0.7–4.0)
MCH: 26.8 pg (ref 26.0–34.0)
MCHC: 32.3 g/dL (ref 30.0–36.0)
MCV: 82.8 fL (ref 78.0–100.0)
MONOS PCT: 8 % (ref 3–12)
Monocytes Absolute: 0.9 10*3/uL (ref 0.1–1.0)
NEUTROS ABS: 7 10*3/uL (ref 1.7–7.7)
Neutrophils Relative %: 69 % (ref 43–77)
PLATELETS: 158 10*3/uL (ref 150–400)
RBC: 4.71 MIL/uL (ref 4.22–5.81)
RDW: 16.4 % — ABNORMAL HIGH (ref 11.5–15.5)
WBC: 10.2 10*3/uL (ref 4.0–10.5)

## 2014-07-18 LAB — GLUCOSE, CAPILLARY
GLUCOSE-CAPILLARY: 171 mg/dL — AB (ref 70–99)
Glucose-Capillary: 143 mg/dL — ABNORMAL HIGH (ref 70–99)
Glucose-Capillary: 137 mg/dL — ABNORMAL HIGH (ref 70–99)

## 2014-07-18 LAB — SEDIMENTATION RATE: Sed Rate: 25 mm/hr — ABNORMAL HIGH (ref 0–16)

## 2014-07-18 LAB — BRAIN NATRIURETIC PEPTIDE: B NATRIURETIC PEPTIDE 5: 115.7 pg/mL — AB (ref 0.0–100.0)

## 2014-07-18 LAB — TROPONIN I

## 2014-07-18 MED ORDER — BISOPROLOL FUMARATE 5 MG PO TABS
2.5000 mg | ORAL_TABLET | Freq: Every day | ORAL | Status: DC
  Administered 2014-07-18 – 2014-07-21 (×4): 2.5 mg via ORAL
  Filled 2014-07-18 (×4): qty 0.5

## 2014-07-18 MED ORDER — ACETAMINOPHEN 325 MG PO TABS: 650.0000 mg | ORAL_TABLET | Freq: Four times a day (QID) | ORAL | Status: DC | PRN

## 2014-07-18 MED ORDER — SODIUM CHLORIDE 0.9 % IJ SOLN
3.0000 mL | Freq: Two times a day (BID) | INTRAMUSCULAR | Status: DC
  Administered 2014-07-19 – 2014-07-21 (×3): 3 mL via INTRAVENOUS

## 2014-07-18 MED ORDER — ATORVASTATIN CALCIUM 40 MG PO TABS
40.0000 mg | ORAL_TABLET | Freq: Every day | ORAL | Status: DC
  Administered 2014-07-18 – 2014-07-20 (×3): 40 mg via ORAL
  Filled 2014-07-18 (×4): qty 1

## 2014-07-18 MED ORDER — WARFARIN SODIUM 5 MG PO TABS
5.0000 mg | ORAL_TABLET | Freq: Every day | ORAL | Status: DC
Start: 2014-07-18 — End: 2014-07-21
  Administered 2014-07-18 – 2014-07-20 (×3): 5 mg via ORAL
  Filled 2014-07-18 (×4): qty 1

## 2014-07-18 MED ORDER — PANTOPRAZOLE SODIUM 40 MG PO TBEC
80.0000 mg | DELAYED_RELEASE_TABLET | Freq: Every day | ORAL | Status: DC
  Administered 2014-07-18 – 2014-07-21 (×4): 80 mg via ORAL
  Filled 2014-07-18 (×4): qty 2

## 2014-07-18 MED ORDER — TAMSULOSIN HCL 0.4 MG PO CAPS
0.4000 mg | ORAL_CAPSULE | Freq: Every day | ORAL | Status: DC
  Administered 2014-07-18 – 2014-07-20 (×3): 0.4 mg via ORAL
  Filled 2014-07-18 (×4): qty 1

## 2014-07-18 MED ORDER — INSULIN GLARGINE 100 UNIT/ML ~~LOC~~ SOLN
15.0000 [IU] | Freq: Every day | SUBCUTANEOUS | Status: DC
  Administered 2014-07-18 – 2014-07-19 (×2): 15 [IU] via SUBCUTANEOUS
  Filled 2014-07-18 (×4): qty 0.15

## 2014-07-18 MED ORDER — SODIUM CHLORIDE 0.9 % IV SOLN: INTRAVENOUS | Status: DC

## 2014-07-18 MED ORDER — LORAZEPAM 0.5 MG PO TABS
0.2500 mg | ORAL_TABLET | Freq: Every day | ORAL | Status: DC
  Administered 2014-07-18 – 2014-07-19 (×2): 0.25 mg via ORAL
  Filled 2014-07-18 (×3): qty 1

## 2014-07-18 MED ORDER — ACETAMINOPHEN 650 MG RE SUPP: 650.0000 mg | Freq: Four times a day (QID) | RECTAL | Status: DC | PRN

## 2014-07-18 MED ORDER — MORPHINE SULFATE 2 MG/ML IJ SOLN: 1.0000 mg | INTRAMUSCULAR | Status: DC | PRN

## 2014-07-18 MED ORDER — ONDANSETRON HCL 4 MG/2ML IJ SOLN: 4.0000 mg | Freq: Four times a day (QID) | INTRAMUSCULAR | Status: DC | PRN

## 2014-07-18 MED ORDER — MIRTAZAPINE 15 MG PO TABS
15.0000 mg | ORAL_TABLET | Freq: Every day | ORAL | Status: DC
  Administered 2014-07-18 – 2014-07-19 (×2): 15 mg via ORAL
  Filled 2014-07-18 (×4): qty 1

## 2014-07-18 MED ORDER — WARFARIN - PHYSICIAN DOSING INPATIENT
Freq: Every day | Status: DC
Start: 2014-07-18 — End: 2014-07-21
  Administered 2014-07-18 – 2014-07-19 (×2): 1

## 2014-07-18 MED ORDER — SODIUM CHLORIDE 0.9 % IV BOLUS (SEPSIS): 500.0000 mL | Freq: Once | INTRAVENOUS | Status: DC

## 2014-07-18 MED ORDER — VENLAFAXINE HCL ER 37.5 MG PO CP24
37.5000 mg | ORAL_CAPSULE | Freq: Every day | ORAL | Status: DC
  Administered 2014-07-19 – 2014-07-21 (×3): 37.5 mg via ORAL
  Filled 2014-07-18 (×4): qty 1

## 2014-07-18 MED ORDER — OXYCODONE-ACETAMINOPHEN 5-325 MG PO TABS
1.0000 | ORAL_TABLET | ORAL | Status: DC | PRN
Start: 2014-07-18 — End: 2014-07-20
  Administered 2014-07-18 – 2014-07-19 (×5): 1 via ORAL
  Filled 2014-07-18 (×5): qty 1

## 2014-07-18 MED ORDER — ONDANSETRON HCL 4 MG PO TABS: 4.0000 mg | ORAL_TABLET | Freq: Four times a day (QID) | ORAL | Status: DC | PRN

## 2014-07-18 MED ORDER — LISINOPRIL 2.5 MG PO TABS
2.5000 mg | ORAL_TABLET | Freq: Every day | ORAL | Status: DC
  Administered 2014-07-18 – 2014-07-21 (×4): 2.5 mg via ORAL
  Filled 2014-07-18 (×4): qty 1

## 2014-07-18 MED ORDER — GABAPENTIN 300 MG PO CAPS
300.0000 mg | ORAL_CAPSULE | Freq: Two times a day (BID) | ORAL | Status: DC
Start: 2014-07-18 — End: 2014-07-19
  Administered 2014-07-18: 300 mg via ORAL
  Filled 2014-07-18 (×3): qty 1

## 2014-07-18 MED ORDER — NITROGLYCERIN 0.4 MG SL SUBL: 0.4000 mg | SUBLINGUAL_TABLET | SUBLINGUAL | Status: DC | PRN

## 2014-07-18 MED ORDER — INSULIN ASPART 100 UNIT/ML ~~LOC~~ SOLN
0.0000 [IU] | Freq: Three times a day (TID) | SUBCUTANEOUS | Status: DC
  Administered 2014-07-18: 2 [IU] via SUBCUTANEOUS
  Administered 2014-07-19 (×2): 3 [IU] via SUBCUTANEOUS
  Administered 2014-07-19: 2 [IU] via SUBCUTANEOUS
  Administered 2014-07-20: 5 [IU] via SUBCUTANEOUS
  Administered 2014-07-20 (×2): 3 [IU] via SUBCUTANEOUS
  Administered 2014-07-21: 5 [IU] via SUBCUTANEOUS
  Administered 2014-07-21: 2 [IU] via SUBCUTANEOUS

## 2014-07-18 MED ORDER — MECLIZINE HCL 25 MG PO TABS: 25.0000 mg | ORAL_TABLET | Freq: Three times a day (TID) | ORAL | Status: DC | PRN

## 2014-07-18 NOTE — Assessment & Plan Note (Signed)
Reason for admission. Start fluids -- stop once he's taking good PO.  Hx/o HF as above.  Worsened/caused by ongoing loose bowels.

## 2014-07-18 NOTE — Progress Notes (Signed)
Subjective:    [name] is a 74 y.o. male who presents to [facility] today:  1.  Gait instability/falls:  Increasing at home.  I last saw [name] about 1 month ago.  He was doing very well then. However, since then, states that falling has increased.  Has been evaluated in [name] after most recent, negative CT of head and neck.  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  Has 8/10 pain in Left hip and Left knee for "some time."    Also with diarrhea for past 3-4 weeks, also worsening.  Now with incontinence.  States has diarrhea 3 x in AM and 3 in PM.  Occasionally at night.  With some associated Left sided abdominal pain as well.    Has also had increasing shortness of breath when he has to ambulate, such as here in clinic.  However pain in leg limits amount of ambulation.  Denies any chest pain, palpitations.  Slow increase in weight at home, not really reflected in weights in clinic.  Noted increasing LE edema.  Continues to take his diuretic.  Wife states he has episodes of confusion, not related to taking any medications.    ROS as above per HPI, otherwise neg.    The following portions of the patient's history were reviewed and updated as appropriate: allergies, current medications, past medical history, family and social history, and problem list. Patient is a nonsmoker.    PMH reviewed, no changes.   Medications reviewed. Current Outpatient Prescriptions  Medication Sig Dispense Refill  . Acetaminophen (TYLENOL ARTHRITIS PAIN PO) Take 1-2 tablets by mouth 2 (two) times daily as needed (for arthritis pain).    Marland Kitchen acetaminophen-codeine (TYLENOL #4) 300-60 MG per tablet Take 1 tablet by mouth every 8 (eight) hours as needed for moderate pain. 45 tablet 2  . atorvastatin (LIPITOR) 40 MG tablet Take 1 tablet (40 mg total) by mouth daily at 6 PM. 30 tablet 0  . bisoprolol (ZEBETA) 5 MG tablet Take 0.5 tablets (2.5 mg total) by mouth daily.  30 tablet 0  . Blood Glucose Monitoring Suppl (ONE TOUCH ULTRA 2) W/DEVICE KIT Please supply patient with 1 glucometer device to use 4 times daily to check blood sugars.  Dx code: 250.0 1 each 1  . glucose blood test strip Use as instructed.  One Step Ultra 2 strips. 100 each 12  . insulin aspart (NOVOLOG) 100 UNIT/ML injection Use sliding scale as prescribed:  0 - 150 0 units; 151 - 200 2 units, 201 - 250 4 units; 251 - 200 6 units, 301 - 350 8 units 10 mL 11  . insulin NPH Human (HUMULIN N,NOVOLIN N) 100 UNIT/ML injection Inject 0.15 mLs (15 Units total) into the skin 2 (two) times daily before a meal. 10 mL 11  . Insulin Syringe-Needle U-100 (B-D INS SYRINGE 0.5CC/30GX1/2") 30G X 1/2" 0.5 ML MISC For use with insulin 100 each 0  . lidocaine (LIDODERM) 5 % Place 1 patch onto the skin daily. Remove & Discard patch within 12 hours or as directed by MD (Patient not taking: Reported on 07/17/2014) 30 patch 0  . lisinopril (PRINIVIL,ZESTRIL) 5 MG tablet Take 0.5 tablets (2.5 mg total) by mouth daily. 30 tablet 1  . LORazepam (ATIVAN) 0.5 MG tablet Take 0.5 tablets (0.25 mg total) by mouth at bedtime. 30 tablet 1  . meclizine (ANTIVERT) 25 MG tablet TAKE ONE TABLET BY MOUTH THREE TIMES  DAILY AS NEEDED FOR  DIZZINESS 30 tablet 0  . mirtazapine (REMERON) 15 MG tablet Take 1 tablet (15 mg total) by mouth at bedtime. 30 tablet 6  . NEEDLE, DISP, 30 G (B-D DISP NEEDLE 30GX1") 30G X 1" MISC For using with insulin. 100 each 0  . nitroGLYCERIN (NITROSTAT) 0.4 MG SL tablet Place 1 tablet (0.4 mg total) under the tongue as needed. For chest pains 30 tablet 6  . omeprazole (PRILOSEC) 40 MG capsule Take 1 capsule (40 mg total) by mouth daily. 30 capsule 3  . simvastatin (ZOCOR) 80 MG tablet Take 1 tablet (80 mg total) by mouth daily. 30 tablet 6  . tamsulosin (FLOMAX) 0.4 MG CAPS capsule Take 1 capsule (0.4 mg total) by mouth daily after supper. 90 capsule 1  . torsemide (DEMADEX) 20 MG tablet TAKE ONE TABLET BY  MOUTH EVERY DAY 60 tablet 2  . venlafaxine XR (EFFEXOR-XR) 37.5 MG 24 hr capsule TAKE ONE CAPSULE BY MOUTH ONCE DAILY 30 capsule 11  . warfarin (COUMADIN) 5 MG tablet Take 1 tablet (5 mg total) by mouth daily at 6 PM. 90 tablet 3   No current facility-administered medications for this visit.     Objective:   Physical Exam BP 120/56 mmHg  Pulse 93  Temp(Src) 98.1 F (36.7 C) (Oral)  Ht _0  (1.727 m)  Wt 258 lb 3.2 oz (117.119 kg)  BMI 39.27 kg/m2  SpO2 93% Gen:  Alert, cooperative patient who appears stated age in no acute distress.  Vital signs reviewed.  Somewhat disheveled appearing today.  HEENT:  [place]/AT.  EOMI, PERRL.  Dry mucus membranes, tonsils non-erythematous, non-edematous.  External ears WNL, Bilateral TM's normal without retraction, redness or bulging.  No cerumen in canals.  Cardiac:  Regular rate and rhythm, distant HS.  Pulm:  Has wheezing throughout, worse at bases.   Abd: Soft/obese.  Has TTP Left lower quadrant.  Exts: Chronic venous stasis changes BL LE's.  +4 pitting edema almost to knees.   MSK:  Pain with external and internal rotation of Left hip.   Neuro:  Long-standing hyperalgesia legs and shoulders again noted today.  Alert and oriented x 4.  No sensation below Knee on Left, no sensation below mid-shin on right. Ambulating with walker Skin:  Cut across nose, face.  Bruising across BL falls from arm.  Purpura noted BL arms as well.   Results for orders placed or performed during the hospital encounter of 07/17/14 (from the past 72 hour(s))  CBC with Differential/Platelet     Status: Abnormal   Collection Time: 07/17/14  5:18 AM  Result Value Ref Range   WBC 10.3 4.0 - 10.5 K/uL   RBC 4.94 4.22 - 5.81 MIL/uL   Hemoglobin 13.2 13.0 - 17.0 g/dL   HCT 40.1 39.0 - 52.0 %   MCV 81.2 78.0 - 100.0 fL   MCH 26.7 26.0 - 34.0 pg   MCHC 32.9 30.0 - 36.0 g/dL   RDW 16.2 (H) 11.5 - 15.5 %   Platelets 168 150 - 400 K/uL   Neutrophils Relative % 70 43 - 77 %    Neutro Abs 7.2 1.7 - 7.7 K/uL   Lymphocytes Relative 18 12 - 46 %   Lymphs Abs 1.8 0.7 - 4.0 K/uL   Monocytes Relative 10 3 - 12 %   Monocytes Absolute 1.0 0.1 - 1.0 K/uL   Eosinophils Relative 2 0 - 5 %   Eosinophils Absolute 0.2 0.0 - 0.7 K/uL  Basophils Relative 0 0 - 1 %   Basophils Absolute 0.0 0.0 - 0.1 K/uL  Basic metabolic panel     Status: Abnormal   Collection Time: 07/17/14  5:18 AM  Result Value Ref Range   Sodium 137 135 - 145 mmol/L   Potassium 3.7 3.5 - 5.1 mmol/L   Chloride 102 96 - 112 mmol/L   CO2 23 19 - 32 mmol/L   Glucose, Bld 233 (H) 70 - 99 mg/dL   BUN 17 6 - 23 mg/dL   Creatinine, Ser 1.11 0.50 - 1.35 mg/dL   Calcium 8.8 8.4 - 10.5 mg/dL   GFR calc non Af Amer 63 (L) >90 mL/min   GFR calc Af Amer 73 (L) >90 mL/min    Comment: (NOTE) The eGFR has been calculated using the CKD EPI equation. This calculation has not been validated in all clinical situations. eGFR's persistently <90 mL/min signify possible Chronic Kidney Disease.    Anion gap 12 5 - 15  Protime-INR     Status: Abnormal   Collection Time: 07/17/14  5:18 AM  Result Value Ref Range   Prothrombin Time 27.1 (H) 11.6 - 15.2 seconds   INR 2.49 (H) 0.00 - 1.49

## 2014-07-18 NOTE — Assessment & Plan Note (Signed)
Dyspnea worsening. LE edema worsening.  Recommend repeat Echo/check BNP first on admit.

## 2014-07-18 NOTE — Assessment & Plan Note (Signed)
Multifactorial, but worsening in past few weeks. Has terrible peripheral neuropathy.  Also Left great toe amputation.  Also with mechanical gait abnormality.  Known longstanding orthostatic hypotension -- BUT not today.   Think dehydration likely playing into this as well. Wife is completely exhausted.  Only one to care for him at home.  Unable to lift him off the ground herself.  Asking for full-time aide at home.

## 2014-07-18 NOTE — Assessment & Plan Note (Signed)
Subacute Check C dif.

## 2014-07-18 NOTE — Assessment & Plan Note (Signed)
Need goals of care discussion around this.  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   Broached today, family didn't seem to understand implications either way.  Continue conversation.

## 2014-07-18 NOTE — H&P (Signed)
[facility] Admission History and Physical Service Pager: [phone]  Patient name: [name] Medical record number: [id number] Date of birth: [birth year] Age: 74 y.o. Gender: male  Primary Care Provider: [name], MD Consultants: None Code Status: DNR  Chief Complaint: Dehydration, Falls  Assessment and Plan: [name] is a 74 y.o. male presenting with dehydration and multiple recent falls. PMH is significant for diabetes with peripheral neuropathy, COPD, hyperlipidemia, h/o PE (currently on coumadin), CHF, HTN, anxiety/depression, BPH, BPPV  # Dehydration: Suspected to be exacerbated by  3-4 week history of diarrhea. Will need gentle fluids due to history of heart failure.  - Admit to [place], [place] attending - Follow up BMP, CBC - NS '@125'  x12hr - 500cc NS bolus - Monitor fluid status due to history of CHF with decreased EF. Holding Torsemide, but can restart pending improvement in fluid status.  # Diarrhea: Noted x3-4 weeks. Associated with left sided abdominal pain. Suspect C. diff colitis as he was recently in nosocomial environment. No history of travel or exposures. Weight is stable/up arguing against malabsorption. No symptoms or signs of systemic inflammation.  - Follow up C. Difficile, Stool Pathogen Panel - Enteric precautions - If above is negative, consider more broad differential  # Falls: Multifactorial: Peripheral neuropathy, orthostatic hypotension (likely worsened by dehydration due to persistent diarrhea) and some deconditioning all contributing. Possibly also vertigo and history of great toe amputation, though the dehydration and possibly deconditioning are the major changes coincident with increased falls. Does not seem syncopal. Negative head and neck CT on 4/25. Peripheral neuropathy noted with no sensation below knee on left and mid-shin on right. History of left great toe amputation. History of  orthostatic hypotension, but none noted in [facility] clinic on 4/26. INR 2.49 on 4/25. History of BPPV treated with Meclizine. - Follow up orthostatic vital signs - Telemetry - Follow up Troponin - Follow up EKG - Gabapentin 329m BID. Titrate up to TID dosing. - PT/OT - Will need to resume home health services at discharge  # History of PE on coumadin. - Discuss goals of care. Currently on Coumadin with history of multiple PEs. Increased risk of bleeding with recent falls. - Continue Coumadin home dose and INR monitoring for now, but consider discontinuing pending discussion above  # Heart Failure with reduced EF: Increased shortness of breath and lower extremity edema. Weight 259 pounds today, up from 254 pounds in March 2016.  Last echocardiogram in 04/2013 with EF 40-45%, mildly to moderately reduced systolic function, severe hypokinesis of basal-midinferolateral and inferior myocardium, normal diastolic dysfunction. Home medications include Bisoprolol 2.560mdaily, Lisinopril 2.74m37maily, Torsemide 28m42m Follow up BNP - Follow up Echocardiogram. - Holding Torsemide. Consider restarting pending improvement in fluid status - Continue Bisoprolol and Lisinopril  # Chronic Hyperalgesia: Notes significant tenderness with touch all over body. Chronic. - Tylenol 650mg61mr PRN mild pain, Morphine 1mg q40m PRN moderate pain - Continue to monitor  # Hyperlipidemia: Home medication Simvastatin 80mg d62m. Will place on Atorvastatin 40mg du174m hospitalization. # Diabetes with Peripheral Neuropathy: Home medications include Novolin sliding scale and Humulin 15units BID. Will initiate Moderate Insulin Sliding Scale and Lantus 15 units. Will initiate Gabapentin 300mg BID61mHypertension: Continue Bisoprolol 2.74mg daily59misinopril 2.74mg daily.62m 121/73 at admission. Continue to monitor. # Anxiety/Depression: Continue Ativan 0.74mg at bed,11mmeron 174mg, Effexo11m.74mg # BPH: Co1mnue Flomax  FEN/GI:  Carb Modified/Heart Healthy Diet, NS @ 125x12hr Prophylaxis: Coumadin  Disposition:  Admit to [facility] Medicine Teaching Service, [place] attending  History of Present Illness: [name] is a 74 y.o. male presenting with falls and dehydration.  Initially seen in [facility] clinic this morning . [facility] by [facility] Medicine Teaching Service. Notes increase in falls over the last several months. Described falls as his left leg giving out. Notes history of hip replacement on left side more than ten years ago. States falls occur when walking and also when rising from seated to standing positions. Usually walks with walker. Also noted diarrhea x3-4 weeks and left sided abdominal pain. Denies chest pain. Denies worsening shortness of breath with ambulation or edema. Denies orthopnea. Does complain of some shortness of breath associated with pain. Denies cramping. Has been eating well with good PO intake. No fevers, weight loss, blood or mucus in stool. No recent travel or sick contacts.   Recently presented to the Emergency Department on 4/25 due to a mechanical fall as he was turning with his walker. Denied prodromal symptoms. Noted pain over forehead and nose with a small laceration observed over nose. Negative head and neck CT. Discharged with plan for PCP follow up.  Review Of Systems: Per HPI  Otherwise 12 point review of systems was performed and was unremarkable.  Patient Active Problem List   Diagnosis Date Noted  . Dehydration 07/18/2014  . Diarrhea 07/18/2014  . Benign paroxysmal positional vertigo 05/26/2014  . OA (osteoarthritis) 05/12/2014  . DM type 2, controlled, with complication 66/44/0347  . Unsteady gait 04/12/2014  . Orthostatic hypotension 05/16/2013  . Generalized weakness 04/30/2013  . At high risk for falls 07/19/2012  . Vitamin D deficiency 03/12/2012  . Falls 02/17/2012  . Bradycardia 01/22/2012  . Hemorrhoid 01/12/2012  . GERD  (gastroesophageal reflux disease) 11/10/2011  . Leg edema 09/20/2011  . Peripheral vascular disease, unspecified 04/16/2011  . Vancomycin adverse reaction 03/13/2011  . COPD (chronic obstructive pulmonary disease) 02/27/2011  . Stasis dermatitis 12/04/2010  . BPH (benign prostatic hyperplasia) 11/01/2010  . Long term current use of anticoagulant therapy 05/03/2010  . FROZEN RIGHT SHOULDER 02/25/2010  . DIABETIC PERIPHERAL NEUROPATHY 12/20/2009  . Depression 07/08/2006  . Essential hypertension 07/08/2006  . HYPERCHOLESTEROLEMIA 05/21/2006  . Anxiety state 05/21/2006  . CORONARY, ARTERIOSCLEROSIS 05/21/2006  . Chronic systolic heart failure 42/59/5638  . REFLUX ESOPHAGITIS 05/21/2006  . APNEA, SLEEP 05/21/2006   Past Medical History: Past Medical History  Diagnosis Date  . Depression   . Diabetes mellitus   . COPD (chronic obstructive pulmonary disease)   . Adhesive capsulitis   . MRSA bacteremia     2011 - possible endocarditis, received 6 weeks IV treatment  . Hyperlipidemia   . Anxiety   . GERD (gastroesophageal reflux disease)   . CAD (coronary artery disease)   . 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 CVA (cerebral infarction) Questionable history  . Diverticulosis   . Osteomyelitis 2012    Sternoclavicular joint   . Chronic back pain   . Angina   . Myocardial infarction   . CHF (congestive heart failure)   . Shortness of breath   . Sleep apnea   . Chronic kidney disease     hx of BPH  . Neuromuscular disorder     HX of diabetic periferal neuropathy  . Hypertension   . Peripheral vascular disease   . On home oxygen therapy     "1.5L prn" (04/12/2014)   Past  Surgical History: Past Surgical History  Procedure Laterality Date  . Septic arthritis      Removal of infected CABG wire by Dr Arlyce Dice - 2011  . Coronary artery bypass graft  1992  . Fracture surgery  1980s    Hip  . Spine surgery  2002    Cervical fusion vertebroplasty  C3-4-5  . Coronary stent placement  1999, 2002  . Doppler echocardiography  Sept 2011    EF 50-55% with some impaired diastolic relaxation  . Colonoscopy  2006     Multiple polyps removed, repeat in 5 years  . Right iliopopliteal bypass - [place]  . Femoral-popliteal bypass graft  2008    Left  . Cardiac catheterization  October 18, 2010    Known obstructive disease, no further blockages  . Cardiac catheterization  03/17/2005    EF 35-40%  . US echocardiography  03/04/2006    EF 50-55%  . Amputation  03/16/2011    Procedure: AMPUTATION DIGIT;  Surgeon: Angelia Mould, MD;  Location[facility] OR;  Service: Vascular;  Laterality: Left;  Great toe   Social History: History  Substance Use Topics  . Smoking status: Former Smoker    Quit date: 06/19/2000  . Smokeless tobacco: Never Used  . Alcohol Use: No   Please also refer to relevant sections of EMR.  Family History: Family History  Problem Relation Age of Onset  . Heart disease Father   . Heart disease Mother    Allergies and Medications: Allergies  Allergen Reactions  . Vancomycin Other (See Comments)    "red man syndrome"  . Diazepam Anxiety    REACTION: makes patient cry   No current facility-administered medications on file prior to encounter.   Current Outpatient Prescriptions on File Prior to Encounter  Medication Sig Dispense Refill  . Acetaminophen (TYLENOL ARTHRITIS PAIN PO) Take 1-2 tablets by mouth 2 (two) times daily as needed (for arthritis pain).    Marland Kitchen acetaminophen-codeine (TYLENOL #4) 300-60 MG per tablet Take 1 tablet by mouth every 8 (eight) hours as needed for moderate pain. 45 tablet 2  . atorvastatin (LIPITOR) 40 MG tablet Take 1 tablet (40 mg total) by mouth daily at 6 PM. 30 tablet 0  . bisoprolol (ZEBETA) 5 MG tablet Take 0.5 tablets (2.5 mg total) by mouth daily. 30 tablet 0  . Blood Glucose Monitoring Suppl (ONE TOUCH ULTRA 2) W/DEVICE KIT Please supply patient with 1 glucometer device to use  4 times daily to check blood sugars.  Dx code: 250.0 1 each 1  . glucose blood test strip Use as instructed.  One Step Ultra 2 strips. 100 each 12  . insulin aspart (NOVOLOG) 100 UNIT/ML injection Use sliding scale as prescribed:  0 - 150 0 units; 151 - 200 2 units, 201 - 250 4 units; 251 - 200 6 units, 301 - 350 8 units 10 mL 11  . insulin NPH Human (HUMULIN N,NOVOLIN N) 100 UNIT/ML injection Inject 0.15 mLs (15 Units total) into the skin 2 (two) times daily before a meal. 10 mL 11  . Insulin Syringe-Needle U-100 (B-D INS SYRINGE 0.5CC/30GX1/2") 30G X 1/2" 0.5 ML MISC For use with insulin 100 each 0  . lidocaine (LIDODERM) 5 % Place 1 patch onto the skin daily. Remove & Discard patch within 12 hours or as directed by MD (Patient not taking: Reported on 07/17/2014) 30 patch 0  . lisinopril (PRINIVIL,ZESTRIL) 5 MG tablet Take 0.5 tablets (2.5 mg total) by mouth  daily. 30 tablet 1  . LORazepam (ATIVAN) 0.5 MG tablet Take 0.5 tablets (0.25 mg total) by mouth at bedtime. 30 tablet 1  . meclizine (ANTIVERT) 25 MG tablet TAKE ONE TABLET BY MOUTH THREE TIMES DAILY AS NEEDED FOR  DIZZINESS 30 tablet 0  . mirtazapine (REMERON) 15 MG tablet Take 1 tablet (15 mg total) by mouth at bedtime. 30 tablet 6  . NEEDLE, DISP, 30 G (B-D DISP NEEDLE 30GX1") 30G X 1" MISC For using with insulin. 100 each 0  . nitroGLYCERIN (NITROSTAT) 0.4 MG SL tablet Place 1 tablet (0.4 mg total) under the tongue as needed. For chest pains 30 tablet 6  . omeprazole (PRILOSEC) 40 MG capsule Take 1 capsule (40 mg total) by mouth daily. 30 capsule 3  . simvastatin (ZOCOR) 80 MG tablet Take 1 tablet (80 mg total) by mouth daily. 30 tablet 6  . tamsulosin (FLOMAX) 0.4 MG CAPS capsule Take 1 capsule (0.4 mg total) by mouth daily after supper. 90 capsule 1  . torsemide (DEMADEX) 20 MG tablet TAKE ONE TABLET BY MOUTH EVERY DAY 60 tablet 2  . venlafaxine XR (EFFEXOR-XR) 37.5 MG 24 hr capsule TAKE ONE CAPSULE BY MOUTH ONCE DAILY 30 capsule 11  .  warfarin (COUMADIN) 5 MG tablet Take 1 tablet (5 mg total) by mouth daily at 6 PM. 90 tablet 3    Objective: BP 121/73 mmHg  Pulse 87  Temp(Src) 98.2 F (36.8 C) (Oral)  Resp 20  SpO2 95% Exam: General: 74yo male resting comfortably in bed in no apparent distress HEENT: Moist mucous membranes (just finished eating and drinking meal), scabbed lacerations noted on nose and forehead secondary to previous falls Cardiovascular: S1 and S2 noted, no murmurs/rubs/gallops, regular rate and rhythm Respiratory: Clear to auscultation bilaterally, no wheezes or crackles, appeared short of breath with pain during exam Abdomen: Soft and non-distended, bowel sounds noted, mild diffuse tenderness Extremities: Tenderness with palpation over arms and legs with patient jumping off of bed at slightest touch, lack of sensation in right foot and left leg from knee distally Skin: Lacerations noted on forehead and nose secondary to fall, multiple bruises over arms and legs secondary to falls, purpura noted on upper extremities. Neuro: AAOx3, no focal deficits, decreased sensation in legs  Labs and Imaging: CBC BMET   Recent Labs Lab 07/17/14 0518  WBC 10.3  HGB 13.2  HCT 40.1  PLT 168    Recent Labs Lab 07/17/14 0518  NA 137  K 3.7  CL 102  CO2 23  BUN 17  CREATININE 1.11  GLUCOSE 233*  CALCIUM 8.8    - INR 2.49 - PTT 27.1  Ct Head Wo Contrast  07/17/2014   CLINICAL DATA:  Multiple recent falls. Worsening right shoulder pain. Chronic neck pain and diffuse headache. Small right frontal laceration and abrasions at the nose. Initial encounter.  EXAM: CT HEAD WITHOUT CONTRAST  CT MAXILLOFACIAL WITHOUT CONTRAST  CT CERVICAL SPINE WITHOUT CONTRAST  TECHNIQUE: Multidetector CT imaging of the head, cervical spine, and maxillofacial structures were performed using the standard protocol without intravenous contrast. Multiplanar CT image reconstructions of the cervical spine and maxillofacial structures  were also generated.  COMPARISON:  None.  FINDINGS: CT HEAD FINDINGS  There is no evidence of acute infarction, mass lesion, or intra- or extra-axial hemorrhage on CT.  Prominence of the ventricles and sulci reflects moderate cortical volume loss. Mild cerebellar atrophy is noted. Chronic infarct is noted at the left parietal and occipital lobes, with  associated encephalomalacia.  The brainstem and fourth ventricle are within normal limits. The basal ganglia are unremarkable in appearance. The cerebral hemispheres demonstrate grossly normal gray-white differentiation. No mass effect or midline shift is seen.  There is no evidence of fracture; visualized osseous structures are unremarkable in appearance. The orbits are within normal limits. The paranasal sinuses and mastoid air cells are well-aerated. No significant soft tissue abnormalities are seen.  CT MAXILLOFACIAL FINDINGS  There is no evidence of fracture or dislocation. The maxilla and mandible appear intact. The nasal bone is unremarkable in appearance. There is complete absence of the dentition.  The orbits are intact bilaterally. The visualized paranasal sinuses and mastoid air cells are well-aerated.  Known soft tissue abrasions at the nose and right frontal region are not well characterized. Calcification is noted along the carotid bifurcations and common carotid arteries. The parapharyngeal fat planes are preserved. The nasopharynx, oropharynx and hypopharynx are unremarkable in appearance. The visualized portions of the valleculae and piriform sinuses are grossly unremarkable.  The parotid and submandibular glands are within normal limits. No cervical lymphadenopathy is seen.  CT CERVICAL SPINE FINDINGS  There is no evidence of acute fracture or subluxation. The patient is status post anterior cervical spinal fusion at C4-C6, with underlying chronic loss of height at C5. Mild underlying degenerative change is seen. Underlying facet disease is noted.  Vertebral bodies demonstrate normal alignment. Prevertebral soft tissues are within normal limits. Chronic small degenerative erosions are seen at the dens.  The thyroid gland is unremarkable in appearance. The visualized lung apices are clear. No significant soft tissue abnormalities are seen.  IMPRESSION: 1. No evidence of traumatic intracranial injury or fracture. 2. No evidence of fracture or dislocation with regard to the maxillofacial structures. 3. No evidence of acute fracture or subluxation along the cervical spine. 4. Moderate cortical volume loss noted. 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 5. Calcification along the carotid bifurcations and common carotid arteries. Carotid ultrasound would be helpful for further evaluation, when and as deemed clinically appropriate. 6. Status post anterior cervical spinal fusion at C4-C6, with underlying chronic loss of height at C5. Mild underlying degenerative change seen.   Electronically Signed   By: Garald Balding M.D.   On: 07/17/2014 06:52   Ct Cervical Spine Wo Contrast  07/17/2014   CLINICAL DATA:  Multiple recent falls. Worsening right shoulder pain. Chronic neck pain and diffuse headache. Small right frontal laceration and abrasions at the nose. Initial encounter.  EXAM: CT HEAD WITHOUT CONTRAST  CT MAXILLOFACIAL WITHOUT CONTRAST  CT CERVICAL SPINE WITHOUT CONTRAST  TECHNIQUE: Multidetector CT imaging of the head, cervical spine, and maxillofacial structures were performed using the standard protocol without intravenous contrast. Multiplanar CT image reconstructions of the cervical spine and maxillofacial structures were also generated.  COMPARISON:  None.  FINDINGS: CT HEAD FINDINGS  There is no evidence of acute infarction, mass lesion, or intra- or extra-axial hemorrhage on CT.  Prominence of the ventricles and sulci reflects moderate cortical volume loss. Mild cerebellar atrophy is noted. Chronic infarct  is noted at the left parietal and occipital lobes, with associated encephalomalacia.  The brainstem and fourth ventricle are within normal limits. The basal ganglia are unremarkable in appearance. The cerebral hemispheres demonstrate grossly normal gray-white differentiation. No mass effect or midline shift is seen.  There is no evidence of fracture; visualized osseous structures are unremarkable in appearance. The orbits are within normal limits. The paranasal sinuses and mastoid air  cells are well-aerated. No significant soft tissue abnormalities are seen.  CT MAXILLOFACIAL FINDINGS  There is no evidence of fracture or dislocation. The maxilla and mandible appear intact. The nasal bone is unremarkable in appearance. There is complete absence of the dentition.  The orbits are intact bilaterally. The visualized paranasal sinuses and mastoid air cells are well-aerated.  Known soft tissue abrasions at the nose and right frontal region are not well characterized. Calcification is noted along the carotid bifurcations and common carotid arteries. The parapharyngeal fat planes are preserved. The nasopharynx, oropharynx and hypopharynx are unremarkable in appearance. The visualized portions of the valleculae and piriform sinuses are grossly unremarkable.  The parotid and submandibular glands are within normal limits. No cervical lymphadenopathy is seen.  CT CERVICAL SPINE FINDINGS  There is no evidence of acute fracture or subluxation. The patient is status post anterior cervical spinal fusion at C4-C6, with underlying chronic loss of height at C5. Mild underlying degenerative change is seen. Underlying facet disease is noted. Vertebral bodies demonstrate normal alignment. Prevertebral soft tissues are within normal limits. Chronic small degenerative erosions are seen at the dens.  The thyroid gland is unremarkable in appearance. The visualized lung apices are clear. No significant soft tissue abnormalities are seen.   IMPRESSION: 1. No evidence of traumatic intracranial injury or fracture. 2. No evidence of fracture or dislocation with regard to the maxillofacial structures. 3. No evidence of acute fracture or subluxation along the cervical spine. 4. Moderate cortical volume loss noted. 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 5. Calcification along the carotid bifurcations and common carotid arteries. Carotid ultrasound would be helpful for further evaluation, when and as deemed clinically appropriate. 6. Status post anterior cervical spinal fusion at C4-C6, with underlying chronic loss of height at C5. Mild underlying degenerative change seen.   Electronically Signed   By: Garald Balding M.D.   On: 07/17/2014 06:52   Ct Maxillofacial Wo Cm  07/17/2014   CLINICAL DATA:  Multiple recent falls. Worsening right shoulder pain. Chronic neck pain and diffuse headache. Small right frontal laceration and abrasions at the nose. Initial encounter.  EXAM: CT HEAD WITHOUT CONTRAST  CT MAXILLOFACIAL WITHOUT CONTRAST  CT CERVICAL SPINE WITHOUT CONTRAST  TECHNIQUE: Multidetector CT imaging of the head, cervical spine, and maxillofacial structures were performed using the standard protocol without intravenous contrast. Multiplanar CT image reconstructions of the cervical spine and maxillofacial structures were also generated.  COMPARISON:  None.  FINDINGS: CT HEAD FINDINGS  There is no evidence of acute infarction, mass lesion, or intra- or extra-axial hemorrhage on CT.  Prominence of the ventricles and sulci reflects moderate cortical volume loss. Mild cerebellar atrophy is noted. Chronic infarct is noted at the left parietal and occipital lobes, with associated encephalomalacia.  The brainstem and fourth ventricle are within normal limits. The basal ganglia are unremarkable in appearance. The cerebral hemispheres demonstrate grossly normal gray-white differentiation. No mass effect or midline  shift is seen.  There is no evidence of fracture; visualized osseous structures are unremarkable in appearance. The orbits are within normal limits. The paranasal sinuses and mastoid air cells are well-aerated. No significant soft tissue abnormalities are seen.  CT MAXILLOFACIAL FINDINGS  There is no evidence of fracture or dislocation. The maxilla and mandible appear intact. The nasal bone is unremarkable in appearance. There is complete absence of the dentition.  The orbits are intact bilaterally. The visualized paranasal sinuses and mastoid air cells are well-aerated.  Known soft  tissue abrasions at the nose and right frontal region are not well characterized. Calcification is noted along the carotid bifurcations and common carotid arteries. The parapharyngeal fat planes are preserved. The nasopharynx, oropharynx and hypopharynx are unremarkable in appearance. The visualized portions of the valleculae and piriform sinuses are grossly unremarkable.  The parotid and submandibular glands are within normal limits. No cervical lymphadenopathy is seen.  CT CERVICAL SPINE FINDINGS  There is no evidence of acute fracture or subluxation. The patient is status post anterior cervical spinal fusion at C4-C6, with underlying chronic loss of height at C5. Mild underlying degenerative change is seen. Underlying facet disease is noted. Vertebral bodies demonstrate normal alignment. Prevertebral soft tissues are within normal limits. Chronic small degenerative erosions are seen at the dens.  The thyroid gland is unremarkable in appearance. The visualized lung apices are clear. No significant soft tissue abnormalities are seen.  IMPRESSION: 1. No evidence of traumatic intracranial injury or fracture. 2. No evidence of fracture or dislocation with regard to the maxillofacial structures. 3. No evidence of acute fracture or subluxation along the cervical spine. 4. Moderate cortical volume loss noted. Chronic infarct at the left  parietal occipital lobes, with associated encephalomalacia. 5. Calcification along the carotid bifurcations and common carotid arteries. Carotid ultrasound would be helpful for further evaluation, when and as deemed clinically appropriate. 6. Status post anterior cervical spinal fusion at C4-C6, with underlying chronic loss of height at C5. Mild underlying degenerative change seen.   Electronically Signed   By: Garald Balding M.D.   On: 07/17/2014 06:52   Lorna Few, DO 07/18/2014, 1:08 PM PGY-1, [place] [facility] pager: [phone], text pages welcome   I have seen and evaluated the patient with Dr. Gerlean Ren. I am in agreement with the note above in its revised form. My additions are in red.  Kashlynn Kundert B. Bonner Puna, MD, PGY-2 07/18/2014 5:34 PM

## 2014-07-18 NOTE — Progress Notes (Signed)
[name] [id number] Admitted to 8B01: 07/18/2014 1:35 PM Attending Provider: Lind Covert, MD    [name] is a 74 y.o. male patient direct admit from home with wife awake, alert  & orientated  X 3,  Prior, VSS - Blood pressure 121/73, pulse 87, temperature 98.2 F (36.8 C), temperature source Oral, resp. rate 20, weight 117.663 kg (259 lb 6.4 oz), SpO2 95 %., R/A, no c/o shortness of breath, no c/o chest pain, no distress noted. Non- Tele.  Attempted  IV site x2.  IV team consulted  Allergies:   Allergies  Allergen Reactions  . Vancomycin Other (See Comments)    "red man syndrome"  . Diazepam Anxiety    REACTION: makes patient cry     Past Medical History  Diagnosis Date  . Depression   . Diabetes mellitus   . COPD (chronic obstructive pulmonary disease)   . Adhesive capsulitis   . MRSA bacteremia     2011 - possible endocarditis, received 6 weeks IV treatment  . Hyperlipidemia   . Anxiety   . GERD (gastroesophageal reflux disease)   . CAD (coronary artery disease)   . 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 CVA (cerebral infarction) Questionable history  . Diverticulosis   . Osteomyelitis 2012    Sternoclavicular joint   . Chronic back pain   . Angina   . Myocardial infarction   . CHF (congestive heart failure)   . Shortness of breath   . Sleep apnea   . Chronic kidney disease     hx of BPH  . Neuromuscular disorder     HX of diabetic periferal neuropathy  . Hypertension   . Peripheral vascular disease   . On home oxygen therapy     "1.5L prn" (04/12/2014)    History:  Will obtained from the patient.  Pt orientation to unit, room and routine. Information packet given to patient/family and safety video watched.  Admission INP armband ID verified with patient/family, and in place. SR up x 2, fall risk assessment complete with Patient and family verbalizing understanding of risks associated with falls. Pt verbalizes an  understanding of how to use the call bell and to call for help before getting out of bed.  Skin, clean-dry- intact with abrasions to face on nose & above right eyebrow, as well as right foot.    Will cont to monitor and assist as needed.  Lindalou Hose, RN 07/18/2014 1:35 PM

## 2014-07-18 NOTE — Progress Notes (Signed)
Notified Dr Elson Clan  OF patient presence.

## 2014-07-19 DIAGNOSIS — F329 Major depressive disorder, single episode, unspecified: Secondary | ICD-10-CM | POA: Diagnosis not present

## 2014-07-19 DIAGNOSIS — I509 Heart failure, unspecified: Secondary | ICD-10-CM | POA: Diagnosis not present

## 2014-07-19 DIAGNOSIS — J45909 Unspecified asthma, uncomplicated: Secondary | ICD-10-CM | POA: Diagnosis not present

## 2014-07-19 DIAGNOSIS — E785 Hyperlipidemia, unspecified: Secondary | ICD-10-CM | POA: Diagnosis not present

## 2014-07-19 DIAGNOSIS — E1142 Type 2 diabetes mellitus with diabetic polyneuropathy: Secondary | ICD-10-CM | POA: Diagnosis not present

## 2014-07-19 DIAGNOSIS — R208 Other disturbances of skin sensation: Secondary | ICD-10-CM | POA: Diagnosis not present

## 2014-07-19 DIAGNOSIS — W19XXXS Unspecified fall, sequela: Secondary | ICD-10-CM

## 2014-07-19 DIAGNOSIS — Z66 Do not resuscitate: Secondary | ICD-10-CM | POA: Diagnosis not present

## 2014-07-19 DIAGNOSIS — R5381 Other malaise: Secondary | ICD-10-CM | POA: Diagnosis not present

## 2014-07-19 DIAGNOSIS — R2681 Unsteadiness on feet: Secondary | ICD-10-CM

## 2014-07-19 DIAGNOSIS — N400 Enlarged prostate without lower urinary tract symptoms: Secondary | ICD-10-CM | POA: Diagnosis not present

## 2014-07-19 DIAGNOSIS — R197 Diarrhea, unspecified: Secondary | ICD-10-CM | POA: Diagnosis not present

## 2014-07-19 DIAGNOSIS — E860 Dehydration: Secondary | ICD-10-CM | POA: Diagnosis not present

## 2014-07-19 DIAGNOSIS — I10 Essential (primary) hypertension: Secondary | ICD-10-CM | POA: Diagnosis not present

## 2014-07-19 DIAGNOSIS — I2782 Chronic pulmonary embolism: Secondary | ICD-10-CM | POA: Diagnosis not present

## 2014-07-19 DIAGNOSIS — R600 Localized edema: Secondary | ICD-10-CM

## 2014-07-19 DIAGNOSIS — R0600 Dyspnea, unspecified: Secondary | ICD-10-CM | POA: Diagnosis not present

## 2014-07-19 DIAGNOSIS — I951 Orthostatic hypotension: Secondary | ICD-10-CM | POA: Diagnosis not present

## 2014-07-19 DIAGNOSIS — Z794 Long term (current) use of insulin: Secondary | ICD-10-CM | POA: Diagnosis not present

## 2014-07-19 DIAGNOSIS — Z89412 Acquired absence of left great toe: Secondary | ICD-10-CM | POA: Diagnosis not present

## 2014-07-19 DIAGNOSIS — Z7901 Long term (current) use of anticoagulants: Secondary | ICD-10-CM | POA: Diagnosis not present

## 2014-07-19 LAB — BASIC METABOLIC PANEL
Anion gap: 8 (ref 5–15)
BUN: 11 mg/dL (ref 6–23)
CALCIUM: 9 mg/dL (ref 8.4–10.5)
CHLORIDE: 106 mmol/L (ref 96–112)
CO2: 24 mmol/L (ref 19–32)
CREATININE: 0.92 mg/dL (ref 0.50–1.35)
GFR calc Af Amer: >90 mL/min (ref >90)
GFR calc non Af Amer: 80 mL/min — ABNORMAL LOW (ref >90)
Glucose, Bld: 163 mg/dL — ABNORMAL HIGH (ref 70–99)
Potassium: 4.4 mmol/L (ref 3.5–5.1)
Sodium: 138 mmol/L (ref 135–145)

## 2014-07-19 LAB — CBC
HCT: 43.2 % (ref 39.0–52.0)
Hemoglobin: 14.1 g/dL (ref 13.0–17.0)
MCH: 26.9 pg (ref 26.0–34.0)
MCHC: 32.6 g/dL (ref 30.0–36.0)
MCV: 82.3 fL (ref 78.0–100.0)
PLATELETS: 153 10*3/uL (ref 150–400)
RBC: 5.25 MIL/uL (ref 4.22–5.81)
RDW: 16.4 % — AB (ref 11.5–15.5)
WBC: 9.4 10*3/uL (ref 4.0–10.5)

## 2014-07-19 LAB — PROTIME-INR
INR: 1.62 — ABNORMAL HIGH (ref 0.00–1.49)
PROTHROMBIN TIME: 19.4 s — AB (ref 11.6–15.2)

## 2014-07-19 LAB — VITAMIN B12: Vitamin B-12: 224 pg/mL (ref 211–911)

## 2014-07-19 LAB — GLUCOSE, CAPILLARY
Glucose-Capillary: 162 mg/dL — ABNORMAL HIGH (ref 70–99)
Glucose-Capillary: 136 mg/dL — ABNORMAL HIGH (ref 70–99)
Glucose-Capillary: 216 mg/dL — ABNORMAL HIGH (ref 70–99)
Glucose-Capillary: 186 mg/dL — ABNORMAL HIGH (ref 70–99)

## 2014-07-19 MED ORDER — GABAPENTIN 300 MG PO CAPS
300.0000 mg | ORAL_CAPSULE | Freq: Three times a day (TID) | ORAL | Status: DC
  Administered 2014-07-20 – 2014-07-21 (×3): 300 mg via ORAL
  Filled 2014-07-19 (×6): qty 1

## 2014-07-19 MED ORDER — GABAPENTIN 300 MG PO CAPS
300.0000 mg | ORAL_CAPSULE | Freq: Two times a day (BID) | ORAL | Status: AC
  Administered 2014-07-19 (×2): 300 mg via ORAL
  Filled 2014-07-19 (×2): qty 1

## 2014-07-19 NOTE — Evaluation (Signed)
Physical Therapy Evaluation Patient Details Name: [name] MRN: [id number] DOB: Feb 19, 1941 Today's Date: 07/19/2014   History of Present Illness  [name] is a 74 y.o. male presenting with dehydration and multiple recent falls. PMH is significant for diabetes with peripheral neuropathy, COPD, hyperlipidemia, h/o PE (currently on coumadin), CHF, HTN, anxiety/depression, BPH, BPPV  Clinical Impression  Pt admitted with the above complications. Pt currently with functional limitations due to the deficits listed below (see PT Problem List). Requires min assist to safely ambulate with a rolling walker. Denies any dizziness this therapy session however reports he has frequent bouts at home and has fallen approximately 7-8 times in the past month. Reports he did very well with Rehab at a SNF recently but has since had a progressive decline with his stability. He provides care for his wife who is in poor health. Would likely benefit most from SNF to prevent further falls and progress his balance prior to returning home to care for his wife. Pt will benefit from skilled PT to increase their independence and safety with mobility to allow discharge to the venue listed below.       Follow Up Recommendations SNF;Supervision for mobility/OOB    Equipment Recommendations  None recommended by PT    Recommendations for Other Services OT consult     Precautions / Restrictions Precautions Precautions: Fall Restrictions Weight Bearing Restrictions: No      Mobility  Bed Mobility Overal bed mobility: Needs Assistance Bed Mobility: Supine to Sit;Sit to Supine     Supine to sit: Min guard Sit to supine: Min guard   General bed mobility comments: Min guard for safety. Some difficulty scooting to edge of bed. Vc for technique.  Transfers Overall transfer level: Needs assistance Equipment used: Rolling walker (2 wheeled) Transfers: Sit to/from Stand Sit to Stand: Min assist          General transfer comment: Min assist for stability. Slow to rise. VC for hand placement. using back of knees for stability on bed.   Ambulation/Gait Ambulation/Gait assistance: Min assist Ambulation Distance (Feet): 65 Feet Assistive device: Rolling walker (2 wheeled) Gait Pattern/deviations: Step-through pattern;Decreased stride length;Antalgic;Drifts right/left;Trunk flexed (staggering anteriorly) Gait velocity: slow   General Gait Details: Slow and guarded with intermittent staggering anteriorly. Min assist for stability and walker control at times. VC for upright posture. No buckling noted. Denies dizziness.  Stairs            Wheelchair Mobility    Modified Rankin (Stroke Patients Only)       Balance Overall balance assessment: Needs assistance;History of Falls Sitting-balance support: No upper extremity supported;Feet supported Sitting balance-[name]: Fair     Standing balance support: Bilateral upper extremity supported Standing balance-[name]: Poor                               Pertinent Vitals/Pain Pain Assessment: 0-10 Pain Score: 5  Pain Location: neck, shoulders, Rt leg Pain Descriptors / Indicators: Dull;Sharp (dull RLE, Sharp neck) Pain Intervention(s): Monitored during session;Repositioned    Home Living Family/patient expects to be discharged to:: Private residence Living Arrangements: Spouse/significant other Available Help at Discharge: Family (States he has to care for his wife) Type of Home: House Home Access: Stairs to enter Entrance Stairs-Rails: None Entrance Stairs-Number of Steps: 2 Home Layout: One level Home Equipment: Environmental consultant - 2 wheels;Walker - 4 wheels;Cane - single point;Bedside commode;Tub bench;Wheelchair - power (pwr w/c  does not work)      Prior Function Level of Independence: Secondary school teacher / Transfers Assistance Needed: uses rollator for mobility in house  ADL's / Homemaking Assistance  Needed: wife assists to was legs and back        Hand Dominance   Dominant Hand: Right    Extremity/Trunk Assessment   Upper Extremity Assessment: Defer to OT evaluation           Lower Extremity Assessment: Generalized weakness (Rt knee pain TTP)         Communication   Communication: No difficulties  Cognition Arousal/Alertness: Awake/alert Behavior During Therapy: WFL for tasks assessed/performed Overall Cognitive Status: Within Functional Limits for tasks assessed                      General Comments General comments (skin integrity, edema, etc.): Complains of significant Rt knee pain, very tender to palpation. States he falls frequently at home, typically upon standing and he feels dizzy before falls occur. Denies feeling dizzy when he lies supine.    Exercises General Exercises - Lower Extremity Ankle Circles/Pumps: AROM;Both;15 reps;Supine Heel Slides: AAROM;Right;5 reps;Supine Straight Leg Raises: AAROM;Both;10 reps;Supine      Assessment/Plan    PT Assessment Patient needs continued PT services  PT Diagnosis Difficulty walking;Abnormality of gait;Generalized weakness;Acute pain   PT Problem List Decreased strength;Decreased activity tolerance;Decreased balance;Decreased mobility;Decreased coordination;Decreased knowledge of use of DME;Pain;Obesity  PT Treatment Interventions DME instruction;Gait training;Functional mobility training;Therapeutic activities;Therapeutic exercise;Balance training;Neuromuscular re-education;Patient/family education;Modalities   PT Goals (Current goals can be found in the Care Plan section) Acute Rehab PT Goals Patient Stated Goal: Stop hurting PT Goal Formulation: With patient Time For Goal Achievement: 08/02/14 Potential to Achieve Goals: Fair    Frequency Min 3X/week   Barriers to discharge Decreased caregiver support cares for wife who is in poor health    Co-evaluation               End of Session  Equipment Utilized During Treatment: Gait belt Activity Tolerance: Patient limited by pain Patient left: in bed;with call bell/phone within reach;with bed alarm set Nurse Communication: Mobility status         Time: 4920-1007 PT Time Calculation (min) (ACUTE ONLY): 32 min   Charges:   PT Evaluation $Initial PT Evaluation Tier I: 1 Procedure PT Treatments $Gait Training: 8-22 mins   PT G CodesEllouise [name] 07/19/2014, 12:35 PM [name], [place]

## 2014-07-19 NOTE — Progress Notes (Signed)
  Echocardiogram 2D Echocardiogram has been performed.  [name] 07/19/2014, 11:46 AM

## 2014-07-19 NOTE — Progress Notes (Signed)
PT Cancellation Note  Patient Details Name: [name] MRN: [id number] DOB: May 25, 1940   Cancelled Treatment:    Reason Eval/Treat Not Completed: Patient at procedure or test/unavailable  Patient having in-room procedure performed at this time. Will follow up as schedule/time allows.   Ellouise Newer 07/19/2014, 10:27 AM Elayne Snare, [place]

## 2014-07-19 NOTE — Progress Notes (Signed)
Family Medicine Teaching Service Daily Progress Note Intern Pager: [phone]  Patient name: [name] Medical record number: [id number] Date of birth: [birth year] Age: 74 y.o. Gender: male  Primary Care Provider: [name], MD Consultants: None Code Status: DNR  Assessment and Plan: 74 y.o. male presenting with dehydration and multiple recent falls. PMH is significant for diabetes with peripheral neuropathy, COPD, hyperlipidemia, h/o PE (currently on coumadin), CHF, HTN, anxiety/depression, BPH, BPPV  # Dehydration: Suspected to be exacerbated by 3-4 week history of diarrhea. Will need gentle fluids due to history of heart failure.  - Nursing and IV team unable to establish IV. Encouraging fluid intake - Monitor fluid status due to history of CHF with decreased EF. Holding Torsemide, but can restart pending improvement in fluid status.  # Diarrhea: Noted x3-4 weeks. Associated with left sided abdominal pain.  Mildly decreased rectal tone, so may be associated with diabetic neuropathy. Increased risk of C. diff colitis as he was recently in nosocomial environment. No history of travel or exposures. Weight is stable/up arguing against malabsorption. No symptoms or signs of systemic inflammation. Colonoscopy 2006 with polyps, instructed to follow up in 5 years. - Follow up C. Difficile, Stool Pathogen Panel - Enteric precautions - If above is negative, consider more broad differential  # Falls: Multifactorial: Peripheral neuropathy, orthostatic hypotension (likely worsened by dehydration due to persistent diarrhea) and some deconditioning all contributing. Possibly also vertigo and history of great toe amputation, though the dehydration and possibly deconditioning are the major changes coincident with increased falls. Does not seem syncopal. Negative head and neck CT on 4/25. Peripheral neuropathy noted with no sensation below knee on left and mid-shin on right. History of left great toe  amputation. History of orthostatic hypotension, but none noted in [facility] on 4/26. INR 2.49 on 4/25. History of BPPV treated with Meclizine. - Orthostatic Vital Signs off of medications:  - Lying 109/56, HR 88  - Sitting 116/51, HR 96  - Standing 66mn 137/77, HR 103 - Orthostatic Vital Signs once medications restarted:  - Lying 113/52, HR 70  - Sitting 121/53, HR 74  - Standing 010m 129/68, HR 94  - Standing 28m26m132/110, HR 96 - Telemetry - Troponin negative - Follow up Echocardiogram - Will note obtain spinal imaging at this time. Denies saddle anesthesia and no documented incidents of incontinence. - Gabapentin 300m70mD - PT/OT - Will need to resume home health services at discharge  # History of PE on coumadin. - Discuss goals of care. Currently on Coumadin with history of multiple PEs. Increased risk of bleeding with recent falls. - Continue Coumadin home dose and INR monitoring for now, but consider discontinuing pending discussion above  # Heart Failure with reduced EF: Increased shortness of breath and lower extremity edema. Weight 259 pounds today, up from 254 pounds in March 2016. Last echocardiogram in 04/2013 with EF 40-45%, mildly to moderately reduced systolic function, severe hypokinesis of basal-midinferolateral and inferior myocardium, normal diastolic dysfunction. Home medications include Bisoprolol 2.5mg 66mly, Lisinopril 2.5mg d328my, Torsemide 20mg. 49mP- 115.7 - Follow up Echocardiogram. - Holding Torsemide. Consider restarting pending improvement in fluid status - Continue Bisoprolol and Lisinopril  # Chronic Hyperalgesia: Notes significant tenderness with touch all over body. Chronic. - Tylenol 650mg q646mRN mild pain, Percocet 5-325  q4hr PRN - Continue to monitor  # Hyperlipidemia: Home medication Simvastatin 80mg dai628mWill place on Atorvastatin 40mg duri17mospitalization. # Diabetes with Peripheral Neuropathy: Home medications include Novolin  sliding scale  and Humulin 15units BID. Will initiate Moderate Insulin Sliding Scale and Lantus 15 units. Will initiate Gabapentin 316m BID. # Hypertension: Continue Bisoprolol 2.5751mdaily, Lisinopril 2.51m109maily. BP 121/73 at admission. Continue to monitor. # Anxiety/Depression: Continue Ativan 0.51mg14m bed, Remeron 151mg60mfexor 37.51mg #62mH: Continue Flomax  FEN/GI: Carb Modified/Heart Healthy Diet, NS @ 125x12hr Prophylaxis: Coumadin  Disposition: Admit to Family Medicine Teaching Service, Ch[place] attending  Subjective:  Notes dizziness with standing this morning. Continues to note pain everywhere except for chest. States Percocet helps for a short amount of time; unable to recall how long medication lasts. Last colonoscopy since 2006. Denies saddle anesthesia. States numbness in feet has been constant and not intermittent.  Objective: Temp:  [98.1 F (36.7 C)-98.3 F (36.8 C)] 98.3 F (36.8 C) (04/27 0509) Pulse Rate:  [67-93] 70 (04/27 0509) Resp:  [18-20] 18 (04/27 0509) BP: (102-143)/(48-73) 102/59 mmHg (04/27 0509) SpO2:  [93 %-96 %] 95 % (04/27 0509) Weight:  [258 lb 3.2 oz (117.119 kg)-259 lb 6.4 oz (117.663 kg)] 259 lb 6.4 oz (117.663 kg) (04/26 1300) Physical Exam: General: 73yo m63yositting up and eating breakfast Cardiovascular: S1 and S2 noted, no murmurs, regular rate and rhythm Respiratory: Clear to auscultation bilaterally, no wheezes, no increased work of breathing Abdomen: Soft and non-distended, no masses palpated Extremities: Edema noted Rectal: Mildly decreased tone, soft brown stool noted without blood  Laboratory:  Recent Labs Lab 07/17/14 0518 07/18/14 2033  WBC 10.3 10.2  HGB 13.2 12.6*  HCT 40.1 39.0  PLT 168 158    Recent Labs Lab 07/17/14 0518 07/18/14 2033  NA 137 138  K 3.7 3.8  CL 102 102  CO2 23 27  BUN 17 11  CREATININE 1.11 0.96  CALCIUM 8.8 8.7  PROT  --  5.9*  BILITOT  --  0.4  ALKPHOS  --  58  ALT  --  14  AST  --  17   GLUCOSE 233* 198*  - BNP 115.7 - Troponin negative - Vitamin B12 224 - ESR 25 - INR 2.49  RaleigLorna Few/27/2016, 8:22 AM PGY-1, [facility] pager: [phone] pages welcome

## 2014-07-19 NOTE — Evaluation (Signed)
Occupational Therapy Evaluation Patient Details Name: [name] MRN: [id number] DOB: 1940/09/05 Today's Date: 07/19/2014    History of Present Illness [name] is a 74 y.o. male presenting with dehydration and multiple recent falls. PMH is significant for diabetes with peripheral neuropathy, COPD, hyperlipidemia, h/o PE (currently on coumadin), CHF, HTN, anxiety/depression, BPH, BPPV   Clinical Impression   PTA pt reports that he ambulated with use of rollator at home and was independent with ADLs with some assist from wife for shower transfer and washing his legs. Pt is limited by pain "all over" and weakness. He requires min A for functional mobility and assist for LB ADLs. Pt will benefit from acute OT to progress to Supervision level. Recommend SNF for ST Rehab at d/c due to decreased caregiver support and hx of falls recently. If pt declines and chooses to d/c home, recommend HHOT, HHaide at d/c.     Follow Up Recommendations  SNF;Supervision/Assistance - 24 hour    Equipment Recommendations  None recommended by OT    Recommendations for Other Services       Precautions / Restrictions Precautions Precautions: Fall Restrictions Weight Bearing Restrictions: No      Mobility Bed Mobility Overal bed mobility: Modified Independent Bed Mobility: Supine to Sit;Sit to Supine     Supine to sit: Min guard Sit to supine: Min guard   General bed mobility comments: Min guard for safety. Some difficulty scooting to edge of bed. Vc for technique.  Transfers Overall transfer level: Needs assistance Equipment used: Rolling walker (2 wheeled) Transfers: Sit to/from Stand Sit to Stand: Min assist         General transfer comment: Min assist for stability. Slow to rise. VC for hand placement. using back of knees for stability on bed.     Balance Overall balance assessment: Needs assistance;History of Falls Sitting-balance support: No upper extremity supported;Feet  supported Sitting balance-[name]: Fair     Standing balance support: Bilateral upper extremity supported Standing balance-[name]: Poor                              ADL Overall ADL's : Needs assistance/impaired Eating/Feeding: Independent;Sitting   Grooming: Set up;Sitting   Upper Body Bathing: Set up;Sitting   Lower Body Bathing: Minimal assistance;Sit to/from stand   Upper Body Dressing : Set up;Sitting   Lower Body Dressing: Moderate assistance;Sit to/from stand   Toilet Transfer: Minimal assistance;Ambulation;RW           Functional mobility during ADLs: Minimal assistance;Rolling walker General ADL Comments: Pt limited by generalized weakness and "whole body" pain. Pt irritable initially, however responded well to kindness and was participative in session. Able to ambulate to room door and back to bed.      Vision Additional Comments: No change from baseline          Pertinent Vitals/Pain Pain Assessment: Faces Pain Score: 5  Faces Pain Scale: Hurts even more Pain Location: neck, shoulders, back of right leg Pain Descriptors / Indicators: Aching;Dull;Sharp Pain Intervention(s): Limited activity within patient's tolerance;Monitored during session;Repositioned     Hand Dominance Right   Extremity/Trunk Assessment Upper Extremity Assessment Upper Extremity Assessment: RUE deficits/detail;LUE deficits/detail RUE Deficits / Details: shoulder arthritis with ROM about 95*; painful with forward flexion LUE Deficits / Details: shoulder arthritis but ROM about 120* and pt reports this is his stronger arm   Lower Extremity Assessment Lower Extremity Assessment: Defer to PT  evaluation   Cervical / Trunk Assessment Cervical / Trunk Assessment: Normal   Communication Communication Communication: No difficulties   Cognition Arousal/Alertness: Awake/alert Behavior During Therapy: WFL for tasks assessed/performed (irritable) Overall Cognitive  Status: Within Functional Limits for tasks assessed                     General Comments       Exercises Exercises: Other exercises Other Exercises Other Exercises: pt participated in seated shoulder forward flexion x5 Bilaterally within limits of pain.    Shoulder Instructions      Home Living Family/patient expects to be discharged to:: Private residence Living Arrangements: Spouse/significant other Available Help at Discharge: Family (States he cares for his wife) Type of Home: House Home Access: Stairs to enter Technical brewer of Steps: 2 Entrance Stairs-Rails: None Home Layout: One level     Bathroom Shower/Tub: Occupational psychologist: Handicapped height     Home Equipment: Environmental consultant - 2 wheels;Walker - 4 wheels;Cane - single point;Bedside commode;Tub bench          Prior Functioning/Environment Level of Independence: Needs assistance  Gait / Transfers Assistance Needed: uses rollator for mobility in house ADL's / Homemaking Assistance Needed: wife assists to wash legs and back and for shower transfer        OT Diagnosis: Generalized weakness;Acute pain   OT Problem List: Decreased strength;Decreased range of motion;Decreased activity tolerance;Impaired balance (sitting and/or standing);Impaired UE functional use;Pain   OT Treatment/Interventions: Self-care/ADL training;Therapeutic exercise;Energy conservation;DME and/or AE instruction;Therapeutic activities;Patient/family education;Balance training    OT Goals(Current goals can be found in the care plan section) Acute Rehab OT Goals Patient Stated Goal: Stop hurting OT Goal Formulation: With patient Time For Goal Achievement: 08/02/14 Potential to Achieve Goals: Good ADL Goals Pt Will Perform Grooming: with supervision;standing Pt Will Perform Lower Body Bathing: with supervision;sit to/from stand Pt Will Perform Lower Body Dressing: with supervision;sit to/from stand Pt Will  Transfer to Toilet: with supervision;ambulating;bedside commode Pt Will Perform Toileting - Clothing Manipulation and hygiene: with supervision;sit to/from stand  OT Frequency: Min 2X/week    End of Session Equipment Utilized During Treatment: Rolling walker Nurse Communication: Mobility status  Activity Tolerance: Patient limited by pain Patient left: in bed;with call bell/phone within reach;with bed alarm set   Time: 7903-8333 OT Time Calculation (min): 21 min Charges:  OT General Charges $OT Visit: 1 Procedure OT Evaluation $Initial OT Evaluation Tier I: 1 Procedure G-Codes:    Juluis Rainier 27-Jul-2014, 2:18 PM  Cyndie Chime, OTR/L Occupational Therapist [phone] (pager)

## 2014-07-19 NOTE — Progress Notes (Signed)
OT Cancellation Note  Patient Details Name: GRANVEL PROUDFOOT MRN: [id number] DOB: 06/10/1940   Cancelled Treatment:    Reason Eval/Treat Not Completed: Other (comment) Pt was eating lunch and irritated with OT arrival. OT offered to return after meal and pt agreed. Will re-attempt as available.   Villa Herb M   Cyndie Chime, OTR/L Occupational Therapist [phone] (pager)  07/19/2014, 1:24 PM

## 2014-07-19 NOTE — Discharge Summary (Signed)
[facility] Discharge Summary  Patient name: [name] Medical record number: [id number] Date of birth: March 21, 1941 Age: 74 y.o. Gender: male Date of Admission: 07/18/2014  Date of Discharge: 07/21/2014  Admitting Physician: Lind Covert, MD  Primary Care Provider: [name], MD Consultants: None  Indication for Hospitalization: Multiple Falls, Dehydration  Discharge Diagnoses/Problem List:  Dehydration  Disposition: Discharge to SNF  Discharge Condition: Stable  Discharge Exam:   Temp: [97.9 F (36.6 C)-98.3 F (36.8 C)] 98.2 F (36.8 C) (04/29 0558) Pulse Rate: [77] 77 (04/28 1409) Resp: [16-18] 16 (04/29 0558) BP: (114-131)/(55-62) 131/62 mmHg (04/29 0558) SpO2: [92 %-96 %] 96 % (04/29 0558) Physical Exam: General: NAD, sitting up and eating breakfast Cardiovascular: S1 and S2 noted, no murmurs, regular rate and rhythm Respiratory: Clear to auscultation bilaterally, no wheezes, no increased work of breathing Abdomen: Soft and non-distended, no masses palpated Extremities: Edema noted  Brief Hospital Course:  Mr. [name] is a 74yo male who initially presented to the [facility] and was directly admitted to the [facility] Medicine Teaching Service on 07/18/14 after his PCP noted history of increased stability with multiple recent falls and noted Mr. [name] to appear dehydrated on exam. IV team was unable to initiate IV after multiple attempts. Mr. [name] was noted to be taking good PO and fluid intake was encouraged throughout hospitalization. Torsemide held at admission. Reports history of diarrhea x3-4 weeks prior to hospitalization. C. Difficile and. GI Pathogen Panel showed unable to be collected secondary to pt's diarrhea resolved. No diarrhea noted during hospitalization. Rectal exam with mildly decreased tone, suspected to be secondary to severe diabetic neuropathy. No saddle anesthesia or incontinence noted.    Workup of increasing falls initiated at admission. Suspected to be multifactorial. Significant peripheral neuropathy secondary to diabetes noted, with no sensation in left leg below knee in none in right leg below mid-calf. Orthostatics normal. History of BPPV treated with meclizine. Troponins negative. Echocardiogram showed EF 40-45%, moderate to severe LVH, left ventricular diastolic dysfunction, moderately dilated left atrium. PT/OT consulted during hospitalization and SNF placement recommended so Mr. [name] can receive the rehab he needs to increase strength and safety with ambulation.  Mr. [name] discharged to SNF on 07/21/2014 following improvement in dehydration.  Issues for Follow Up:  1. Continue to monitor gait. History of multiple recent falls 2. Coumadin 3. Monitor depression.   Significant Procedures: None  Significant Labs and Imaging:   Recent Labs Lab 07/17/14 0518 07/18/14 2033 07/19/14 1105  WBC 10.3 10.2 9.4  HGB 13.2 12.6* 14.1  HCT 40.1 39.0 43.2  PLT 168 158 153    Recent Labs Lab 07/17/14 0518 07/18/14 2033 07/19/14 1105  NA 137 138 138  K 3.7 3.8 4.4  CL 102 102 106  CO2 '23 27 24  ' GLUCOSE 233* 198* 163*  BUN '17 11 11  ' CREATININE 1.11 0.96 0.92  CALCIUM 8.8 8.7 9.0  ALKPHOS  --  58  --   AST  --  17  --   ALT  --  14  --   ALBUMIN  --  3.3*  --   - BNP 115.7 - Troponin negative - Vitamin B12 224 - ESR 25 - INR 1.62  Results/Tests Pending at Time of Discharge: None  Discharge Medications:    Medication List    STOP taking these medications        atorvastatin 40 MG tablet  Commonly known as:  LIPITOR  TAKE these medications        acetaminophen 650 MG CR tablet  Commonly known as:  TYLENOL  Take 1,300 mg by mouth every 8 (eight) hours as needed for pain.     acetaminophen-codeine 300-60 MG per tablet  Commonly known as:  TYLENOL #4  Take 1 tablet by mouth every 8 (eight) hours as needed for moderate pain.      bisoprolol 5 MG tablet  Commonly known as:  ZEBETA  Take 0.5 tablets (2.5 mg total) by mouth daily.     glucose blood test strip  Use as instructed.  One Step Ultra 2 strips.     insulin aspart 100 UNIT/ML injection  Commonly known as:  NOVOLOG  Use sliding scale as prescribed:  0 - 150 0 units; 151 - 200 2 units, 201 - 250 4 units; 251 - 200 6 units, 301 - 350 8 units     insulin NPH Human 100 UNIT/ML injection  Commonly known as:  HUMULIN N,NOVOLIN N  Inject 0.15 mLs (15 Units total) into the skin 2 (two) times daily before a meal.     Insulin Syringe-Needle U-100 30G X 1/2" 0.5 ML Misc  Commonly known as:  B-D INS SYRINGE 0.5CC/30GX1/2"  For use with insulin     lidocaine 5 %  Commonly known as:  LIDODERM  Place 1 patch onto the skin daily. Remove & Discard patch within 12 hours or as directed by MD     lisinopril 2.5 MG tablet  Commonly known as:  PRINIVIL,ZESTRIL  Take 2.5 mg by mouth daily.     lisinopril 5 MG tablet  Commonly known as:  PRINIVIL,ZESTRIL  Take 0.5 tablets (2.5 mg total) by mouth daily.     LORazepam 0.5 MG tablet  Commonly known as:  ATIVAN  Take 0.5 tablets (0.25 mg total) by mouth at bedtime.     meclizine 25 MG tablet  Commonly known as:  ANTIVERT  TAKE ONE TABLET BY MOUTH THREE TIMES DAILY AS NEEDED FOR  DIZZINESS     mirtazapine 15 MG tablet  Commonly known as:  REMERON  Take 1 tablet (15 mg total) by mouth at bedtime.     NEEDLE (DISP) 30 G 30G X 1" Misc  Commonly known as:  B-D DISP NEEDLE 30GX1"  For using with insulin.     nitroGLYCERIN 0.4 MG SL tablet  Commonly known as:  NITROSTAT  Place 1 tablet (0.4 mg total) under the tongue as needed. For chest pains     omeprazole 40 MG capsule  Commonly known as:  PRILOSEC  Take 1 capsule (40 mg total) by mouth daily.     ONE TOUCH ULTRA 2 W/DEVICE Kit  Please supply patient with 1 glucometer device to use 4 times daily to check blood sugars.  Dx code: 250.0     simvastatin 80 MG  tablet  Commonly known as:  ZOCOR  Take 1 tablet (80 mg total) by mouth daily.     tamsulosin 0.4 MG Caps capsule  Commonly known as:  FLOMAX  Take 1 capsule (0.4 mg total) by mouth daily after supper.     torsemide 20 MG tablet  Commonly known as:  DEMADEX  TAKE ONE TABLET BY MOUTH EVERY DAY     venlafaxine XR 37.5 MG 24 hr capsule  Commonly known as:  EFFEXOR-XR  TAKE ONE CAPSULE BY MOUTH ONCE DAILY      ASK your doctor about these medications  warfarin 5 MG tablet  Commonly known as:  COUMADIN  Take 1 tablet (5 mg total) by mouth daily at 6 PM.        Discharge Instructions: Please refer to Patient Instructions section of EMR for full details.  Patient was counseled important signs and symptoms that should prompt return to medical care, changes in medications, dietary instructions, activity restrictions, and follow up appointments.   Follow-Up Appointments:   Lorna Few, DO 07/19/2014, 11:22 PM PGY-1, [place]

## 2014-07-20 DIAGNOSIS — E860 Dehydration: Secondary | ICD-10-CM | POA: Diagnosis not present

## 2014-07-20 DIAGNOSIS — E785 Hyperlipidemia, unspecified: Secondary | ICD-10-CM | POA: Diagnosis not present

## 2014-07-20 DIAGNOSIS — R600 Localized edema: Secondary | ICD-10-CM | POA: Diagnosis not present

## 2014-07-20 DIAGNOSIS — R5381 Other malaise: Secondary | ICD-10-CM | POA: Diagnosis not present

## 2014-07-20 DIAGNOSIS — E1142 Type 2 diabetes mellitus with diabetic polyneuropathy: Secondary | ICD-10-CM | POA: Diagnosis not present

## 2014-07-20 DIAGNOSIS — Z794 Long term (current) use of insulin: Secondary | ICD-10-CM | POA: Diagnosis not present

## 2014-07-20 DIAGNOSIS — R197 Diarrhea, unspecified: Secondary | ICD-10-CM | POA: Diagnosis not present

## 2014-07-20 DIAGNOSIS — R208 Other disturbances of skin sensation: Secondary | ICD-10-CM | POA: Diagnosis not present

## 2014-07-20 DIAGNOSIS — J45909 Unspecified asthma, uncomplicated: Secondary | ICD-10-CM | POA: Diagnosis not present

## 2014-07-20 DIAGNOSIS — R2681 Unsteadiness on feet: Secondary | ICD-10-CM | POA: Diagnosis not present

## 2014-07-20 DIAGNOSIS — I2782 Chronic pulmonary embolism: Secondary | ICD-10-CM | POA: Diagnosis not present

## 2014-07-20 DIAGNOSIS — F329 Major depressive disorder, single episode, unspecified: Secondary | ICD-10-CM | POA: Diagnosis not present

## 2014-07-20 DIAGNOSIS — Z7901 Long term (current) use of anticoagulants: Secondary | ICD-10-CM | POA: Diagnosis not present

## 2014-07-20 DIAGNOSIS — I951 Orthostatic hypotension: Secondary | ICD-10-CM | POA: Diagnosis not present

## 2014-07-20 DIAGNOSIS — N400 Enlarged prostate without lower urinary tract symptoms: Secondary | ICD-10-CM | POA: Diagnosis not present

## 2014-07-20 DIAGNOSIS — Z89412 Acquired absence of left great toe: Secondary | ICD-10-CM | POA: Diagnosis not present

## 2014-07-20 DIAGNOSIS — I509 Heart failure, unspecified: Secondary | ICD-10-CM | POA: Diagnosis not present

## 2014-07-20 DIAGNOSIS — Z66 Do not resuscitate: Secondary | ICD-10-CM | POA: Diagnosis not present

## 2014-07-20 DIAGNOSIS — I10 Essential (primary) hypertension: Secondary | ICD-10-CM | POA: Diagnosis not present

## 2014-07-20 LAB — GLUCOSE, CAPILLARY
Glucose-Capillary: 152 mg/dL — ABNORMAL HIGH (ref 70–99)
Glucose-Capillary: 202 mg/dL — ABNORMAL HIGH (ref 70–99)
Glucose-Capillary: 193 mg/dL — ABNORMAL HIGH (ref 70–99)
Glucose-Capillary: 198 mg/dL — ABNORMAL HIGH (ref 70–99)

## 2014-07-20 LAB — PROTIME-INR
INR: 1.82 — ABNORMAL HIGH (ref 0.00–1.49)
PROTHROMBIN TIME: 21.3 s — AB (ref 11.6–15.2)

## 2014-07-20 MED ORDER — TORSEMIDE 20 MG PO TABS
20.0000 mg | ORAL_TABLET | Freq: Every day | ORAL | Status: DC
  Administered 2014-07-20 – 2014-07-21 (×2): 20 mg via ORAL
  Filled 2014-07-20 (×2): qty 1

## 2014-07-20 MED ORDER — HYDROCODONE-ACETAMINOPHEN 5-325 MG PO TABS
1.0000 | ORAL_TABLET | Freq: Two times a day (BID) | ORAL | Status: DC | PRN
  Administered 2014-07-20 – 2014-07-21 (×2): 1 via ORAL
  Filled 2014-07-20 (×3): qty 1

## 2014-07-20 NOTE — Clinical Social Work Note (Signed)
Clinical Social Work Assessment  Patient Details  Name: [name] MRN: [id number] Date of Birth: January 22, 1941  Date of referral:  07/20/14               Reason for consult:  Discharge Planning                Permission sought to share information with:  Family Supports, Customer service manager Permission granted to share information::  Yes, Verbal Permission Granted  Name::     Chartered certified accountant::     Relationship::     Contact Information:     Housing/Transportation Living arrangements for the past 2 months:  [place], [place] of Information:  Patient, Spouse Patient Interpreter Needed:  None Criminal Activity/Legal Involvement Pertinent to Current Situation/Hospitalization:  No - Comment as needed Significant Relationships:  Spouse Lives with:  Spouse Do you feel safe going back to the place where you live?  Yes Need for family participation in patient care:  Yes (Comment)  Care giving concerns:  Patient's wife is in agreement with the patient going to SNF at discharge as she feels his needs are too great for return home at this time.   Social Worker assessment / plan:  CSW met with patient at bedside and spoke with wife by phone to complete assessment. Patient lives with wife at home. Wife would like for patient to go to SNF, but patient states he needs to discuss it further with his wife. Patient states that if he does go to a facility he will want to [facility] where he has been in the past.   Employment status:  Retired Nurse, adult PT Recommendations:  [place] / Referral to community resources:  [place]  Patient/Family's Response to care:  Patient and wife are agreeable with plan to DC to SNF once medically stable.  Patient/Family's Understanding of and Emotional Response to Diagnosis, Current Treatment, and Prognosis:  Patient and wife seem to have  fair insight into reason for admission. Both verbalize their understanding of the patient's needs post discharge.   Emotional Assessment Appearance:  Appears stated age Attitude/Demeanor/Rapport:  Other (Appropriate) Affect (typically observed):  Accepting, Appropriate, Calm, Stable Orientation:  Oriented to Self, Oriented to Place, Oriented to  Time, Oriented to Situation Alcohol / Substance use:  Other (Hx of tobacco use) Psych involvement (Current and /or in the community):  No (Comment)  Discharge Needs  Concerns to be addressed:  Discharge Planning Concerns Readmission within the last 30 days:  No Current discharge risk:  Physical Impairment Barriers to Discharge:  Barriers Resolved  Liz Beach MSW, [place], [place], [phone]

## 2014-07-20 NOTE — Care Management Note (Addendum)
    Page 1 of 1   07/21/2014     2:28:22 PM CARE MANAGEMENT NOTE 07/21/2014  Patient:  [name], [name]   Account Number:  [id number]  Date Initiated:  07/20/2014  Documentation initiated by:  Tomi Bamberger  Subjective/Objective Assessment:   dx dehydration  admit- lives with spouse     Action/Plan:   Anticipated DC Date:  07/21/2014   Anticipated DC Plan:  SKILLED NURSING FACILITY  In-house referral  Clinical Social Worker      DC Planning Services  CM consult      Choice offered to / List presented to:             Status of service:  Completed, signed off Medicare Important Message given?  YES (If response is "NO", the following Medicare IM given date fields will be blank) Date Medicare IM given:  07/20/2014 Medicare IM given by:  Tomi Bamberger Date Additional Medicare IM given:   Additional Medicare IM given by:    Discharge Disposition:  [place]  Per UR Regulation:  Reviewed for med. necessity/level of care/duration of stay  If discussed at [place] of Stay Meetings, dates discussed:    Comments:  07/20/14 [place], BSN [phone] patient for dc to snf, CSW following.

## 2014-07-20 NOTE — Progress Notes (Signed)
Family Medicine Teaching Service Daily Progress Note Intern Pager: [phone]  Patient name: [name] Medical record number: [id number] Date of birth: 1940/07/22 Age: 74 y.o. Gender: male  Primary Care Provider: [name], MD Consultants: None Code Status: DNR  Assessment and Plan: 74 y.o. male presenting with dehydration and multiple recent falls. PMH is significant for diabetes with peripheral neuropathy, COPD, hyperlipidemia, h/o PE (currently on coumadin), CHF, HTN, anxiety/depression, BPH, BPPV  # Dehydration: Suspected to be exacerbated by 3-4 week history of diarrhea.  - Nursing and IV team unable to establish IV. Encouraging fluid intake - Monitor fluid status due to history of CHF with decreased EF. Holding Torsemide, but can restart pending improvement in fluid status. - Encourage PO intake - Consider restarting home torsemide--Torsemide restarted   # Diarrhea: Resolved. Noted x3-4 weeks prior to hospitalization. Associated with left sided abdominal pain.  Mildly decreased rectal tone, so may be associated with diabetic neuropathy. Increased risk of C. diff colitis as he was recently in nosocomial environment. No history of travel or exposures. Colonoscopy 2006 with polyps, instructed to follow up in 5 years. None noted in hospital - Follow up C. Difficile, Stool Pathogen Panel - Enteric precautions--discontinuing. No diarrhea noted during hospitalization  # Falls: Multifactorial: Peripheral neuropathy, orthostatic hypotension (likely worsened by dehydration due to persistent diarrhea) and some deconditioning all contributing. Possibly also vertigo and history of great toe amputation, though the dehydration and possibly deconditioning are the major changes coincident with increased falls. Does not seem syncopal. Negative head and neck CT on 4/25. Peripheral neuropathy noted with no sensation below knee on left and mid-shin on right. History of left great toe amputation.  History of orthostatic hypotension, but none noted in [facility] clinic on 4/26. INR 2.49 on 4/25. History of BPPV treated with Meclizine. - Orthostatic Vital Signs off of medications:  - Lying 109/56, HR 88  - Sitting 116/51, HR 96  - Standing 46mn 137/77, HR 103 - Orthostatic Vital Signs once medications restarted:  - Lying 113/52, HR 70  - Sitting 121/53, HR 74  - Standing 07m 129/68, HR 94  - Standing 56m72m132/110, HR 96 - Telemetry - Troponin negative - Echocardiogram- EF 40-45%, severe hypokinesis of inferior and inferolateral segments (similar to prior study), moderate to severe LVH consistent with left ventricular diastolic dysfunction, moderately dilated left atrium - Will note obtain spinal imaging at this time. Denies saddle anesthesia and no documented incidents of incontinence. - Gabapentin 300m556mD - PT/OT - Will need to resume home health services at discharge  # Heart Failure with reduced EF: Increased shortness of breath and lower extremity edema. Weight 259 pounds today, up from 254 pounds in March 2016. Last echocardiogram in 04/2013 with EF 40-45%, mildly to moderately reduced systolic function, severe hypokinesis of basal-midinferolateral and inferior myocardium, normal diastolic dysfunction. Home medications include Bisoprolol 2.5mg 47mly, Lisinopril 2.5mg d52my, Torsemide 20mg. 36mP- 115.7 - Echocardiogram- EF 40-45%, severe hypokinesis of inferior and inferolateral segments (similar to prior study), moderate to severe LVH consistent with left ventricular diastolic dysfunction, moderately dilated left atrium - Holding Torsemide. -Restarting today - Continue Bisoprolol and Lisinopril  # Chronic Hyperalgesia: Notes significant tenderness with touch all over body. Chronic. - Tylenol 650mg q61mRN mild pain, Percocet 5-325  q12hr PRN - Continue to monitor  FEN/GI: Carb Modified/Heart Healthy Diet, NS @ 125x12hr Prophylaxis: Coumadin  Disposition: Admit to Family  [place]. SW for SNF Placement. Discharge pending placement.  Subjective:  Feeling  better today. Tolerating PO. Ambulating with walker.  Objective: Temp:  [97.8 F (36.6 C)-97.9 F (36.6 C)] 97.9 F (36.6 C) (04/28 0529) Pulse Rate:  [66] 66 (04/27 1456) Resp:  [15-18] 15 (04/28 0529) BP: (112-124)/(44-87) 124/50 mmHg (04/28 0529) SpO2:  [96 %-97 %] 97 % (04/28 0529) Physical Exam: General: 74yo male resting comfortably in no apparent distress Cardiovascular: S1 and S2 noted, no murmurs, regular rate and rhythm Respiratory: Clear to auscultation bilaterally, no wheezes, no increased work of breathing Abdomen: Soft and non-distended, no masses palpated Extremities: Edema noted  Laboratory:  Recent Labs Lab 07/17/14 0518 07/18/14 2033 07/19/14 1105  WBC 10.3 10.2 9.4  HGB 13.2 12.6* 14.1  HCT 40.1 39.0 43.2  PLT 168 158 153    Recent Labs Lab 07/17/14 0518 07/18/14 2033 07/19/14 1105  NA 137 138 138  K 3.7 3.8 4.4  CL 102 102 106  CO2 _0 BUN _1 CREATININE 1.11 0.96 0.92  CALCIUM 8.8 8.7 9.0  PROT  --  5.9*  --   BILITOT  --  0.4  --   ALKPHOS  --  58  --   ALT  --  14  --   AST  --  17  --   GLUCOSE 233* 198* 163*  - BNP 115.7 - Troponin negative - Vitamin B12 224 - ESR 25 - INR [address] [place], DO 07/20/2014, 9:18 AM PGY-1, [place] Intern pager: [phone], text pages welcome

## 2014-07-20 NOTE — Clinical Social Work Placement (Signed)
   CLINICAL SOCIAL WORK PLACEMENT  NOTE  Date:  07/20/2014  Patient Details  Name: [name] MRN: [id number] Date of Birth: 27-Dec-1940  Clinical Social Work is seeking post-discharge placement for this patient at the [place] level of care (*CSW will initial, date and re-position this form in  chart as items are completed):  Yes   Patient/family provided with [place] Work Department's list of facilities offering this level of care within the geographic area requested by the patient (or if unable, by the patient's family).  Yes   Patient/family informed of their freedom to choose among providers that offer the needed level of care, that participate in Medicare, Medicaid or managed care program needed by the patient, have an available bed and are willing to accept the patient.  Yes   Patient/family informed of [place]'s ownership interest in [facility] and [facility], as well as of the fact that they are under no obligation to receive care at these facilities.  PASRR submitted to EDS on       PASRR number received on       Existing PASRR number confirmed on 07/20/14     FL2 transmitted to all facilities in geographic area requested by pt/family on       FL2 transmitted to all facilities within larger geographic area on 07/20/14     Patient informed that his/her managed care company has contracts with or will negotiate with certain facilities, including the following:            Patient/family informed of bed offers received.  Patient chooses bed at       Physician recommends and patient chooses bed at      Patient to be transferred to   on  .  Patient to be transferred to facility by       Patient family notified on   of transfer.  Name of family member notified:        PHYSICIAN       Additional Comment:    _______________________________________________ Liz Beach MSW, [place], [place], [phone]

## 2014-07-21 DIAGNOSIS — I739 Peripheral vascular disease, unspecified: Secondary | ICD-10-CM | POA: Diagnosis not present

## 2014-07-21 DIAGNOSIS — M25552 Pain in left hip: Secondary | ICD-10-CM | POA: Diagnosis not present

## 2014-07-21 DIAGNOSIS — W010XXA Fall on same level from slipping, tripping and stumbling without subsequent striking against object, initial encounter: Secondary | ICD-10-CM | POA: Diagnosis not present

## 2014-07-21 DIAGNOSIS — G4730 Sleep apnea, unspecified: Secondary | ICD-10-CM | POA: Diagnosis not present

## 2014-07-21 DIAGNOSIS — Z9181 History of falling: Secondary | ICD-10-CM | POA: Diagnosis not present

## 2014-07-21 DIAGNOSIS — N289 Disorder of kidney and ureter, unspecified: Secondary | ICD-10-CM | POA: Diagnosis not present

## 2014-07-21 DIAGNOSIS — M47816 Spondylosis without myelopathy or radiculopathy, lumbar region: Secondary | ICD-10-CM | POA: Diagnosis not present

## 2014-07-21 DIAGNOSIS — Y998 Other external cause status: Secondary | ICD-10-CM | POA: Diagnosis not present

## 2014-07-21 DIAGNOSIS — J449 Chronic obstructive pulmonary disease, unspecified: Secondary | ICD-10-CM | POA: Diagnosis not present

## 2014-07-21 DIAGNOSIS — Y9389 Activity, other specified: Secondary | ICD-10-CM | POA: Diagnosis not present

## 2014-07-21 DIAGNOSIS — Z8673 Personal history of transient ischemic attack (TIA), and cerebral infarction without residual deficits: Secondary | ICD-10-CM | POA: Diagnosis not present

## 2014-07-21 DIAGNOSIS — I252 Old myocardial infarction: Secondary | ICD-10-CM | POA: Diagnosis not present

## 2014-07-21 DIAGNOSIS — M6281 Muscle weakness (generalized): Secondary | ICD-10-CM | POA: Diagnosis not present

## 2014-07-21 DIAGNOSIS — R259 Unspecified abnormal involuntary movements: Secondary | ICD-10-CM | POA: Diagnosis not present

## 2014-07-21 DIAGNOSIS — I951 Orthostatic hypotension: Secondary | ICD-10-CM | POA: Diagnosis not present

## 2014-07-21 DIAGNOSIS — G8929 Other chronic pain: Secondary | ICD-10-CM | POA: Diagnosis not present

## 2014-07-21 DIAGNOSIS — I25119 Atherosclerotic heart disease of native coronary artery with unspecified angina pectoris: Secondary | ICD-10-CM | POA: Diagnosis not present

## 2014-07-21 DIAGNOSIS — I10 Essential (primary) hypertension: Secondary | ICD-10-CM | POA: Diagnosis not present

## 2014-07-21 DIAGNOSIS — Z794 Long term (current) use of insulin: Secondary | ICD-10-CM | POA: Diagnosis not present

## 2014-07-21 DIAGNOSIS — Z66 Do not resuscitate: Secondary | ICD-10-CM | POA: Diagnosis not present

## 2014-07-21 DIAGNOSIS — M542 Cervicalgia: Secondary | ICD-10-CM | POA: Diagnosis not present

## 2014-07-21 DIAGNOSIS — I129 Hypertensive chronic kidney disease with stage 1 through stage 4 chronic kidney disease, or unspecified chronic kidney disease: Secondary | ICD-10-CM | POA: Diagnosis not present

## 2014-07-21 DIAGNOSIS — R5381 Other malaise: Secondary | ICD-10-CM | POA: Diagnosis not present

## 2014-07-21 DIAGNOSIS — Z79899 Other long term (current) drug therapy: Secondary | ICD-10-CM | POA: Diagnosis not present

## 2014-07-21 DIAGNOSIS — Z7901 Long term (current) use of anticoagulants: Secondary | ICD-10-CM | POA: Diagnosis not present

## 2014-07-21 DIAGNOSIS — Z951 Presence of aortocoronary bypass graft: Secondary | ICD-10-CM | POA: Diagnosis not present

## 2014-07-21 DIAGNOSIS — Z9889 Other specified postprocedural states: Secondary | ICD-10-CM | POA: Diagnosis not present

## 2014-07-21 DIAGNOSIS — E118 Type 2 diabetes mellitus with unspecified complications: Secondary | ICD-10-CM | POA: Diagnosis not present

## 2014-07-21 DIAGNOSIS — Z9981 Dependence on supplemental oxygen: Secondary | ICD-10-CM | POA: Diagnosis not present

## 2014-07-21 DIAGNOSIS — E669 Obesity, unspecified: Secondary | ICD-10-CM | POA: Diagnosis not present

## 2014-07-21 DIAGNOSIS — I2782 Chronic pulmonary embolism: Secondary | ICD-10-CM | POA: Diagnosis not present

## 2014-07-21 DIAGNOSIS — R52 Pain, unspecified: Secondary | ICD-10-CM | POA: Diagnosis not present

## 2014-07-21 DIAGNOSIS — F329 Major depressive disorder, single episode, unspecified: Secondary | ICD-10-CM | POA: Diagnosis not present

## 2014-07-21 DIAGNOSIS — R488 Other symbolic dysfunctions: Secondary | ICD-10-CM | POA: Diagnosis not present

## 2014-07-21 DIAGNOSIS — R2681 Unsteadiness on feet: Secondary | ICD-10-CM | POA: Diagnosis not present

## 2014-07-21 DIAGNOSIS — Z89412 Acquired absence of left great toe: Secondary | ICD-10-CM | POA: Diagnosis not present

## 2014-07-21 DIAGNOSIS — W19XXXS Unspecified fall, sequela: Secondary | ICD-10-CM | POA: Diagnosis not present

## 2014-07-21 DIAGNOSIS — I5022 Chronic systolic (congestive) heart failure: Secondary | ICD-10-CM | POA: Diagnosis not present

## 2014-07-21 DIAGNOSIS — I509 Heart failure, unspecified: Secondary | ICD-10-CM | POA: Diagnosis not present

## 2014-07-21 DIAGNOSIS — S0990XA Unspecified injury of head, initial encounter: Secondary | ICD-10-CM | POA: Diagnosis not present

## 2014-07-21 DIAGNOSIS — Z9861 Coronary angioplasty status: Secondary | ICD-10-CM | POA: Diagnosis not present

## 2014-07-21 DIAGNOSIS — S79912A Unspecified injury of left hip, initial encounter: Secondary | ICD-10-CM | POA: Diagnosis not present

## 2014-07-21 DIAGNOSIS — E1140 Type 2 diabetes mellitus with diabetic neuropathy, unspecified: Secondary | ICD-10-CM | POA: Diagnosis not present

## 2014-07-21 DIAGNOSIS — S3992XA Unspecified injury of lower back, initial encounter: Secondary | ICD-10-CM | POA: Diagnosis not present

## 2014-07-21 DIAGNOSIS — R278 Other lack of coordination: Secondary | ICD-10-CM | POA: Diagnosis not present

## 2014-07-21 DIAGNOSIS — E860 Dehydration: Secondary | ICD-10-CM | POA: Diagnosis not present

## 2014-07-21 DIAGNOSIS — E1142 Type 2 diabetes mellitus with diabetic polyneuropathy: Secondary | ICD-10-CM | POA: Diagnosis not present

## 2014-07-21 DIAGNOSIS — G9389 Other specified disorders of brain: Secondary | ICD-10-CM | POA: Diagnosis not present

## 2014-07-21 DIAGNOSIS — Z8614 Personal history of Methicillin resistant Staphylococcus aureus infection: Secondary | ICD-10-CM | POA: Diagnosis not present

## 2014-07-21 DIAGNOSIS — J45909 Unspecified asthma, uncomplicated: Secondary | ICD-10-CM | POA: Diagnosis not present

## 2014-07-21 DIAGNOSIS — N400 Enlarged prostate without lower urinary tract symptoms: Secondary | ICD-10-CM | POA: Diagnosis not present

## 2014-07-21 DIAGNOSIS — Z86711 Personal history of pulmonary embolism: Secondary | ICD-10-CM | POA: Diagnosis not present

## 2014-07-21 DIAGNOSIS — E785 Hyperlipidemia, unspecified: Secondary | ICD-10-CM | POA: Diagnosis not present

## 2014-07-21 DIAGNOSIS — R600 Localized edema: Secondary | ICD-10-CM | POA: Diagnosis not present

## 2014-07-21 DIAGNOSIS — Z87891 Personal history of nicotine dependence: Secondary | ICD-10-CM | POA: Diagnosis not present

## 2014-07-21 DIAGNOSIS — M545 Low back pain: Secondary | ICD-10-CM | POA: Diagnosis not present

## 2014-07-21 DIAGNOSIS — K219 Gastro-esophageal reflux disease without esophagitis: Secondary | ICD-10-CM | POA: Diagnosis not present

## 2014-07-21 DIAGNOSIS — S199XXA Unspecified injury of neck, initial encounter: Secondary | ICD-10-CM | POA: Diagnosis not present

## 2014-07-21 DIAGNOSIS — R208 Other disturbances of skin sensation: Secondary | ICD-10-CM | POA: Diagnosis not present

## 2014-07-21 DIAGNOSIS — Y92122 Bedroom in nursing home as the place of occurrence of the external cause: Secondary | ICD-10-CM | POA: Diagnosis not present

## 2014-07-21 DIAGNOSIS — E329 Disease of thymus, unspecified: Secondary | ICD-10-CM | POA: Diagnosis not present

## 2014-07-21 DIAGNOSIS — R197 Diarrhea, unspecified: Secondary | ICD-10-CM | POA: Diagnosis not present

## 2014-07-21 DIAGNOSIS — F419 Anxiety disorder, unspecified: Secondary | ICD-10-CM | POA: Diagnosis not present

## 2014-07-21 DIAGNOSIS — N189 Chronic kidney disease, unspecified: Secondary | ICD-10-CM | POA: Diagnosis not present

## 2014-07-21 LAB — PROTIME-INR
INR: 2.25 — AB (ref 0.00–1.49)
Prothrombin Time: 25 seconds — ABNORMAL HIGH (ref 11.6–15.2)

## 2014-07-21 LAB — GLUCOSE, CAPILLARY
GLUCOSE-CAPILLARY: 147 mg/dL — AB (ref 70–99)
Glucose-Capillary: 227 mg/dL — ABNORMAL HIGH (ref 70–99)

## 2014-07-21 MED ORDER — LORAZEPAM 0.5 MG PO TABS: 0.2500 mg | ORAL_TABLET | Freq: Every day | ORAL | Status: DC

## 2014-07-21 NOTE — Progress Notes (Signed)
Physical Therapy Treatment Patient Details Name: [name] MRN: [id number] DOB: [birth year] Today's Date: 07/21/2014    History of Present Illness RAYHAN GROLEAU is a 74 y.o. male presenting with dehydration and multiple recent falls. PMH is significant for diabetes with peripheral neuropathy, COPD, hyperlipidemia, h/o PE (currently on coumadin), CHF, HTN, anxiety/depression, BPH, BPPV    PT Comments    Patient making improvements with mobility and gait.  Continues to be limited by pain.  Agree with SNF at discharge for continued therapy.  Follow Up Recommendations  SNF;Supervision for mobility/OOB     Equipment Recommendations  None recommended by PT    Recommendations for Other Services       Precautions / Restrictions Precautions Precautions: Fall Restrictions Weight Bearing Restrictions: No    Mobility  Bed Mobility               General bed mobility comments: Patient in chair as PT entered room.  Transfers Overall transfer level: Needs assistance Equipment used: Rolling walker (2 wheeled) Transfers: Sit to/from Stand Sit to Stand: Min assist         General transfer comment: Verbal cues for hand placement.  Increased time for transition.  Assist for balance/safety  Ambulation/Gait Ambulation/Gait assistance: Min assist Ambulation Distance (Feet): 100 Feet Assistive device: Rolling walker (2 wheeled) Gait Pattern/deviations: Step-through pattern;Decreased stance time - left;Decreased step length - right;Decreased stride length;Antalgic;Trunk flexed Gait velocity: slow Gait velocity interpretation: Below normal speed for age/gender General Gait Details: Verbal cues to stand upright during gait.  Patient with flexed posture.  Noted antalgic gait, with pain LLE.  Assist for balance/safety.  Fatigues quickly.   Stairs            Wheelchair Mobility    Modified Rankin (Stroke Patients Only)       Balance           Standing balance  support: Bilateral upper extremity supported Standing balance-[name]: Poor                      Cognition Arousal/Alertness: Awake/alert Behavior During Therapy: WFL for tasks assessed/performed Overall Cognitive Status: Within Functional Limits for tasks assessed                      Exercises      General Comments        Pertinent Vitals/Pain Pain Assessment: 0-10 Pain Score: 7  Pain Location: LLE, neck and shoulders Pain Descriptors / Indicators: Aching;Sore Pain Intervention(s): Limited activity within patient's tolerance;Repositioned    Home Living                      Prior Function            PT Goals (current goals can now be found in the care plan section) Progress towards PT goals: Progressing toward goals    Frequency  Min 3X/week    PT Plan Current plan remains appropriate    Co-evaluation             End of Session Equipment Utilized During Treatment: Gait belt Activity Tolerance: Patient limited by pain Patient left: with call bell/phone within reach;in bed (sitting EOB)     Time: 1121-1130 PT Time Calculation (min) (ACUTE ONLY): 9 min  Charges:  $Gait Training: 8-22 mins                    G Codes:  Despina Pole 07/21/2014, 12:50 PM Carita Pian. Sanjuana Kava, [place] Pager [phone]

## 2014-07-21 NOTE — Discharge Instructions (Signed)

## 2014-07-21 NOTE — Clinical Social Work Placement (Signed)
   CLINICAL SOCIAL WORK PLACEMENT  NOTE  Date:  07/21/2014  Patient Details  Name: [name] MRN: [id number] Date of Birth: 16-Mar-1941  Clinical Social Work is seeking post-discharge placement for this patient at the [place] level of care (*CSW will initial, date and re-position this form in  chart as items are completed):  Yes   Patient/family provided with [place] Work Department's list of facilities offering this level of care within the geographic area requested by the patient (or if unable, by the patient's family).  Yes   Patient/family informed of their freedom to choose among providers that offer the needed level of care, that participate in Medicare, Medicaid or managed care program needed by the patient, have an available bed and are willing to accept the patient.  Yes   Patient/family informed of [place]'s ownership interest in [facility] and [facility], as well as of the fact that they are under no obligation to receive care at these facilities.  PASRR submitted to EDS on       PASRR number received on       Existing PASRR number confirmed on 07/20/14     FL2 transmitted to all facilities in geographic area requested by pt/family on       FL2 transmitted to all facilities within larger geographic area on 07/20/14     Patient informed that his/her managed care company has contracts with or will negotiate with certain facilities, including the following:        Yes   Patient/family informed of bed offers received.  Patient chooses bed at [facility]     Physician recommends and patient chooses bed at      Patient to be transferred to [facility] on 07/21/14.  Patient to be transferred to facility by ambulance     Patient family notified on 07/21/14 of transfer.  Name of family member notified:  Butch Penny     PHYSICIAN Please prepare priority discharge summary, including  medications, Please prepare prescriptions, Please sign FL2, Please sign DNR     Additional Comment:   Per MD patient ready for DC to [facility]. RN, patient, patient's family, and facility notified of DC. RN given number for report. DC packet on chart. Ambulance transport requested for patient. CSW signing off.  _______________________________________________ Rigoberto Noel, LCSW 07/21/2014, 3:13 PM

## 2014-07-21 NOTE — Progress Notes (Signed)
Physical Therapy Note  Addendum for missed G Code    08-02-2014 1100  PT G-Codes **NOT FOR INPATIENT CLASS**  Functional Assessment Tool Used clinical observation  Functional Limitation Mobility: Walking and moving around  Mobility: Walking and Moving Around Current Status [phone]) CI  Mobility: Walking and Moving Around Goal Status [phone]) CI    Camille Bal [place], [place]

## 2014-07-21 NOTE — Progress Notes (Signed)
Patient was discharged to nursing home [facility]) by MD order; discharged instructions  review and sent to facility with care notes and prescriptions; IV DIC; skin intact; facility was called and report was given to nurse who is going to receive the patient; patient will be transported to facility via EMS.

## 2014-07-21 NOTE — Progress Notes (Signed)
Family Medicine Teaching Service Daily Progress Note Intern Pager: [phone]  Patient name: [name] Medical record number: [id number] Date of birth: 1941-02-19 Age: 74 y.o. Gender: male  Primary Care Provider: [name], MD Consultants: None Code Status: DNR  Assessment and Plan: 74 y.o. male presenting with dehydration and multiple recent falls. PMH is significant for diabetes with peripheral neuropathy, COPD, hyperlipidemia, h/o PE (currently on coumadin), CHF, HTN, anxiety/depression, BPH, BPPV  # Dehydration: Suspected to be exacerbated by 3-4 week history of diarrhea. Will need gentle fluids due to history of heart failure.  - Nursing and IV team unable to establish IV. Encouraging fluid intake - Monitor fluid status due to history of CHF with decreased EF. Holding Torsemide, but can restart pending improvement in fluid status.  # Diarrhea: Noted x3-4 weeks. Associated with left sided abdominal pain.  Mildly decreased rectal tone, so may be associated with diabetic neuropathy. Increased risk of C. diff colitis as he was recently in nosocomial environment. No history of travel or exposures. Weight is stable/up arguing against malabsorption. No symptoms or signs of systemic inflammation. Colonoscopy 2006 with polyps, instructed to follow up in 5 years. - Follow up Stool Pathogen Panel - Enteric precautions - If above is negative, consider more broad differential  # Falls: Multifactorial: Peripheral neuropathy, orthostatic hypotension (likely worsened by dehydration due to persistent diarrhea) and some deconditioning all contributing. Possibly also vertigo and history of great toe amputation, though the dehydration and possibly deconditioning are the major changes coincident with increased falls. Does not seem syncopal. Negative head and neck CT on 4/25. Peripheral neuropathy noted with no sensation below knee on left and mid-shin on right. History of left great toe amputation.  History of orthostatic hypotension, but none noted in [facility] clinic on 4/26. INR 2.49 on 4/25. History of BPPV treated with Meclizine. - Orthostatic Vital Signs off of medications:  - Lying 109/56, HR 88  - Sitting 116/51, HR 96  - Standing 39mn 137/77, HR 103 - Orthostatic Vital Signs once medications restarted:  - Lying 113/52, HR 70  - Sitting 121/53, HR 74  - Standing 061m 129/68, HR 94  - Standing 1m66m132/110, HR 96 - Telemetry - Troponin negative - Follow up Echocardiogram - Will note obtain spinal imaging at this time. Denies saddle anesthesia and no documented incidents of incontinence. - Gabapentin 300m34mD - PT/OT - Will need to resume home health services at discharge  # History of PE on coumadin. - Discuss goals of care. Currently on Coumadin with history of multiple PEs. Increased risk of bleeding with recent falls. - Continue Coumadin home dose and INR monitoring for now, but consider discontinuing pending discussion above  # Heart Failure with reduced EF: Increased shortness of breath and lower extremity edema. Weight 259 pounds today, up from 254 pounds in March 2016. Last echocardiogram in 04/2013 with EF 40-45%, mildly to moderately reduced systolic function, severe hypokinesis of basal-midinferolateral and inferior myocardium, normal diastolic dysfunction. Home medications include Bisoprolol 2.5mg 74mly, Lisinopril 2.5mg d61my, Torsemide 20mg. 77mP- 115.7 - Follow up Echocardiogram. - Holding Torsemide. Consider restarting pending improvement in fluid status - Continue Bisoprolol and Lisinopril  # Chronic Hyperalgesia: Notes significant tenderness with touch all over body. Chronic. - Tylenol 650mg q629mRN mild pain, Percocet 5-325  q4hr PRN - Continue to monitor  # Hyperlipidemia: Home medication Simvastatin 80mg dai62mWill place on Atorvastatin 40mg duri56mospitalization.  # Diabetes with Peripheral Neuropathy: Home medications include Novolin sliding scale and  Humulin 15units BID. Will initiate Moderate Insulin Sliding Scale and Lantus 15 units. Will initiate Gabapentin 371m BID. # Hypertension: Continue Bisoprolol 2.532mdaily, Lisinopril 2.30m57maily. BP 121/73 at admission. Continue to monitor. # Anxiety/Depression: Continue Ativan 0.30mg38m bed, Remeron 130mg36mfexor 37.30mg #30mH: Continue Flomax  FEN/GI: Carb Modified/Heart Healthy Diet, NS @ 125x12hr Prophylaxis: Coumadin  Disposition: Admit to Family Medicine Teaching Service, [place]  Subjective:  Feels improved diarrhea, tolerating PO  Objective: Temp:  [97.9 F (36.6 C)-98.3 F (36.8 C)] 98.2 F (36.8 C) (04/29 0558) Pulse Rate:  [77] 77 (04/28 1409) Resp:  [16-18] 16 (04/29 0558) BP: (114-131)/(55-62) 131/62 mmHg (04/29 0558) SpO2:  [92 %-96 %] 96 % (04/29 0558) Physical Exam: General: NAD, sitting up and eating breakfast Cardiovascular: S1 and S2 noted, no murmurs, regular rate and rhythm Respiratory: Clear to auscultation bilaterally, no wheezes, no increased work of breathing Abdomen: Soft and non-distended, no masses palpated Extremities: Edema noted   Laboratory:  Recent Labs Lab 07/17/14 0518 07/18/14 2033 07/19/14 1105  WBC 10.3 10.2 9.4  HGB 13.2 12.6* 14.1  HCT 40.1 39.0 43.2  PLT 168 158 153    Recent Labs Lab 07/17/14 0518 07/18/14 2033 07/19/14 1105  NA 137 138 138  K 3.7 3.8 4.4  CL 102 102 106  CO2 '23 27 24  ' BUN '17 11 11  ' CREATININE 1.11 0.96 0.92  CALCIUM 8.8 8.7 9.0  PROT  --  5.9*  --   BILITOT  --  0.4  --   ALKPHOS  --  58  --   ALT  --  14  --   AST  --  17  --   GLUCOSE 233* 198* 163*  - BNP 115.7 - Troponin negative - Vitamin B12 224 - ESR 25 - INR 2.49  4/27 Echo: Unchanged from previous echo 05/01/2014  AlyssaVeatrice Bourbon/29/2016, 8:48 AM PGY-1, [facility] pager: [phone] pages welcome

## 2014-07-25 ENCOUNTER — Non-Acute Institutional Stay (SKILLED_NURSING_FACILITY): Payer: Medicare Other | Admitting: Internal Medicine

## 2014-07-25 ENCOUNTER — Encounter: Payer: Self-pay | Admitting: Internal Medicine

## 2014-07-25 DIAGNOSIS — I5022 Chronic systolic (congestive) heart failure: Secondary | ICD-10-CM

## 2014-07-25 DIAGNOSIS — E118 Type 2 diabetes mellitus with unspecified complications: Secondary | ICD-10-CM | POA: Diagnosis not present

## 2014-07-25 DIAGNOSIS — R600 Localized edema: Secondary | ICD-10-CM | POA: Diagnosis not present

## 2014-07-25 DIAGNOSIS — W19XXXS Unspecified fall, sequela: Secondary | ICD-10-CM

## 2014-07-25 DIAGNOSIS — I951 Orthostatic hypotension: Secondary | ICD-10-CM | POA: Diagnosis not present

## 2014-07-25 NOTE — Assessment & Plan Note (Signed)
Apparently a chronic problem;will monitor along with weight

## 2014-07-25 NOTE — Assessment & Plan Note (Signed)
Not reported to be an issue ;A1c 7.4;continue insulin;pt is on ACE and statin

## 2014-07-25 NOTE — Assessment & Plan Note (Signed)
Reported stable this hospitalization

## 2014-07-25 NOTE — Assessment & Plan Note (Addendum)
Suspected to be multifactorial. Significant peripheral neuropathy secondary to diabetes noted, with no sensation in left leg below knee in none in right leg below mid-calf. Orthostatics normal. History of BPPV treated with meclizine. Troponins negative. Echocardiogram showed EF 40-45%, moderate to severe LVH, left ventricular diastolic dysfunction, moderately dilated left ; admitted to SNF for OT/PT

## 2014-07-25 NOTE — Assessment & Plan Note (Signed)
2/2 dehydration from presumed diarrhea in the weeks prior but none in hosp;C diff and GI path neg;IV not accessed, po's were pushedd and demadex stopped

## 2014-07-25 NOTE — Progress Notes (Signed)
MRN: [id number] Name: [name]  Sex: male Age: 74 y.o. DOB: 1940-10-31  [place] [facility]: [facility] Facility/Room:108 Level Of Care: SNF Provider: Inocencio Homes D Emergency Contacts: Extended Emergency Contact Information Primary Emergency Contact: Jesten, Cappuccio Address: [place], [place] Mobile Phone: [phone] Relation: Spouse  Code Status:   Allergies: Vancomycin and Diazepam  Chief Complaint  Patient presents with  . New Admit To SNF    HPI: Patient is 74 y.o. male who has been falling at home lately, was admitted to hospital for dehydration and is now admitted to SNF for OT/PT  Past Medical History  Diagnosis Date  . Depression   . Diabetes mellitus   . COPD (chronic obstructive pulmonary disease)   . Adhesive capsulitis   . MRSA bacteremia     2011 - possible endocarditis, received 6 weeks IV treatment  . Hyperlipidemia   . Anxiety   . GERD (gastroesophageal reflux disease)   . CAD (coronary artery disease)   . 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 CVA (cerebral infarction) Questionable history  . Diverticulosis   . Osteomyelitis 2012    Sternoclavicular joint   . Chronic back pain   . Angina   . Myocardial infarction   . CHF (congestive heart failure)   . Shortness of breath   . Sleep apnea   . Chronic kidney disease     hx of BPH  . Neuromuscular disorder     HX of diabetic periferal neuropathy  . Hypertension   . Peripheral vascular disease   . On home oxygen therapy     "1.5L prn" (04/12/2014)  . Falls frequently 06/2014    Past Surgical History  Procedure Laterality Date  . Septic arthritis      Removal of infected CABG wire by Dr Arlyce Dice - 2011  . Coronary artery bypass graft  1992  . Fracture surgery  1980s    Hip  . Spine surgery  2002    Cervical fusion vertebroplasty C3-4-5  . Coronary stent placement  1999, 2002  . Doppler echocardiography  Sept 2011     EF 50-55% with some impaired diastolic relaxation  . Colonoscopy  2006     Multiple polyps removed, repeat in 5 years  . Right iliopopliteal bypass - [place]  . Femoral-popliteal bypass graft  2008    Left  . Cardiac catheterization  October 18, 2010    Known obstructive disease, no further blockages  . Cardiac catheterization  03/17/2005    EF 35-40%  . US echocardiography  03/04/2006    EF 50-55%  . Amputation  03/16/2011    Procedure: AMPUTATION DIGIT;  Surgeon: Angelia Mould, MD;  Location: [facility] OR;  Service: Vascular;  Laterality: Left;  Great toe  . Hip arthroplasty Left       Medication List       This list is accurate as of: 07/25/14 11:59 PM.  Always use your most recent med list.               acetaminophen 650 MG CR tablet  Commonly known as:  TYLENOL  Take 1,300 mg by mouth every 8 (eight) hours as needed for pain.     acetaminophen-codeine 300-60 MG per tablet  Commonly known as:  TYLENOL #4  Take 1 tablet by mouth every 8 (eight) hours as needed for moderate  pain.     bisoprolol 5 MG tablet  Commonly known as:  ZEBETA  Take 0.5 tablets (2.5 mg total) by mouth daily.     glucose blood test strip  Use as instructed.  One Step Ultra 2 strips.     insulin aspart 100 UNIT/ML injection  Commonly known as:  NOVOLOG  Use sliding scale as prescribed:  0 - 150 0 units; 151 - 200 2 units, 201 - 250 4 units; 251 - 200 6 units, 301 - 350 8 units     insulin NPH Human 100 UNIT/ML injection  Commonly known as:  HUMULIN N,NOVOLIN N  Inject 0.15 mLs (15 Units total) into the skin 2 (two) times daily before a meal.     Insulin Syringe-Needle U-100 30G X 1/2" 0.5 ML Misc  Commonly known as:  B-D INS SYRINGE 0.5CC/30GX1/2"  For use with insulin     lidocaine 5 %  Commonly known as:  LIDODERM  Place 1 patch onto the skin daily. Remove & Discard patch within 12 hours or as directed by MD     lisinopril 2.5 MG tablet  Commonly known as:  PRINIVIL,ZESTRIL   Take 2.5 mg by mouth daily.     lisinopril 5 MG tablet  Commonly known as:  PRINIVIL,ZESTRIL  Take 0.5 tablets (2.5 mg total) by mouth daily.     LORazepam 0.5 MG tablet  Commonly known as:  ATIVAN  Take 0.5 tablets (0.25 mg total) by mouth at bedtime.     LORazepam 0.5 MG tablet  Commonly known as:  ATIVAN  Take 0.5 tablets (0.25 mg total) by mouth at bedtime.     meclizine 25 MG tablet  Commonly known as:  ANTIVERT  TAKE ONE TABLET BY MOUTH THREE TIMES DAILY AS NEEDED FOR  DIZZINESS     mirtazapine 15 MG tablet  Commonly known as:  REMERON  Take 1 tablet (15 mg total) by mouth at bedtime.     NEEDLE (DISP) 30 G 30G X 1" Misc  Commonly known as:  B-D DISP NEEDLE 30GX1"  For using with insulin.     nitroGLYCERIN 0.4 MG SL tablet  Commonly known as:  NITROSTAT  Place 1 tablet (0.4 mg total) under the tongue as needed. For chest pains     omeprazole 40 MG capsule  Commonly known as:  PRILOSEC  Take 1 capsule (40 mg total) by mouth daily.     ONE TOUCH ULTRA 2 W/DEVICE Kit  Please supply patient with 1 glucometer device to use 4 times daily to check blood sugars.  Dx code: 250.0     simvastatin 80 MG tablet  Commonly known as:  ZOCOR  Take 1 tablet (80 mg total) by mouth daily.     tamsulosin 0.4 MG Caps capsule  Commonly known as:  FLOMAX  Take 1 capsule (0.4 mg total) by mouth daily after supper.     torsemide 20 MG tablet  Commonly known as:  DEMADEX  TAKE ONE TABLET BY MOUTH EVERY DAY     venlafaxine XR 37.5 MG 24 hr capsule  Commonly known as:  EFFEXOR-XR  TAKE ONE CAPSULE BY MOUTH ONCE DAILY     warfarin 5 MG tablet  Commonly known as:  COUMADIN  Take 1 tablet (5 mg total) by mouth daily at 6 PM.        No orders of the defined types were placed in this encounter.    Immunization History  Administered Date(s) Administered  . Influenza  Split 12/12/2010  . Influenza Whole 12/22/2005, 12/14/2007  . Influenza,inj,Quad PF,36+ Mos 01/20/2013,  12/09/2013  . Pneumococcal Conjugate-13 10/10/2013  . Pneumococcal Polysaccharide-23 11/23/1999, 03/25/2003, 05/25/2011  . Td 10/23/2003  . Tdap 07/17/2014    History  Substance Use Topics  . Smoking status: Former Smoker    Quit date: 06/19/2000  . Smokeless tobacco: Never Used  . Alcohol Use: No    Family history is noncontributory    Review of Systems  DATA OBTAINED: from patient, nurse GENERAL:  no fevers, fatigue, appetite changes SKIN: No itching, rash or wounds EYES: No eye pain, redness, discharge EARS: No earache, tinnitus, change in hearing NOSE: No congestion, drainage or bleeding  MOUTH/THROAT: No mouth or tooth pain, No sore throat RESPIRATORY: No cough, wheezing, SOB CARDIAC: No chest pain, palpitations, lower extremity edema  GI: No abdominal pain, No N/V/D or constipation, No heartburn or reflux  GU: No dysuria, frequency or urgency, or incontinence  MUSCULOSKELETAL: No unrelieved bone/joint pain NEUROLOGIC: No headache, dizziness or focal weakness PSYCHIATRIC: No overt anxiety or sadness, No behavior issue.   Filed Vitals:   07/25/14 2004  BP: 106/56  Pulse: 85  Temp: 97.2 F (36.2 C)  Resp: 18    Physical Exam  GENERAL APPEARANCE: Alert, No acute distress.  SKIN: No diaphoresis rash HEAD: Normocephalic, atraumatic  EYES: Conjunctiva/lids clear. Pupils round, reactive. EOMs intact.  EARS: External exam WNL, canals clear. Hearing grossly normal.  NOSE: No deformity or discharge.  MOUTH/THROAT: Lips w/o lesions  RESPIRATORY: Breathing is even, unlabored. Lung sounds are clear   CARDIOVASCULAR: Heart RRR no murmurs, rubs or gallops. No peripheral edema.   GASTROINTESTINAL: Abdomen is soft, non-tender, not distended w/ normal bowel sounds. GENITOURINARY: Bladder non tender, not distended  MUSCULOSKELETAL: No abnormal joints or musculature NEUROLOGIC:  Cranial nerves 2-12 grossly intact PSYCHIATRIC: Mood and affect appropriate to situation, no  behavioral issues  Patient Active Problem List   Diagnosis Date Noted  . Bilateral edema of lower extremity   . Dehydration 07/18/2014  . Diarrhea 07/18/2014  . Benign paroxysmal positional vertigo 05/26/2014  . OA (osteoarthritis) 05/12/2014  . DM type 2, controlled, with complication 07/86/7544  . Unsteady gait 04/12/2014  . Orthostatic hypotension 05/16/2013  . Generalized weakness 04/30/2013  . At high risk for falls 07/19/2012  . Vitamin D deficiency 03/12/2012  . Falls 02/17/2012  . Bradycardia 01/22/2012  . Hemorrhoid 01/12/2012  . GERD (gastroesophageal reflux disease) 11/10/2011  . Leg edema 09/20/2011  . Peripheral vascular disease, unspecified 04/16/2011  . Vancomycin adverse reaction 03/13/2011  . COPD (chronic obstructive pulmonary disease) 02/27/2011  . Stasis dermatitis 12/04/2010  . BPH (benign prostatic hyperplasia) 11/01/2010  . Long term current use of anticoagulant therapy 05/03/2010  . FROZEN RIGHT SHOULDER 02/25/2010  . DIABETIC PERIPHERAL NEUROPATHY 12/20/2009  . Depression 07/08/2006  . Essential hypertension 07/08/2006  . HYPERCHOLESTEROLEMIA 05/21/2006  . Anxiety state 05/21/2006  . CORONARY, ARTERIOSCLEROSIS 05/21/2006  . Chronic systolic heart failure 92/03/69  . REFLUX ESOPHAGITIS 05/21/2006  . APNEA, SLEEP 05/21/2006    CBC    Component Value Date/Time   WBC 9.4 07/19/2014 1105   RBC 5.25 07/19/2014 1105   HGB 14.1 07/19/2014 1105   HCT 43.2 07/19/2014 1105   PLT 153 07/19/2014 1105   MCV 82.3 07/19/2014 1105   LYMPHSABS 2.1 07/18/2014 2033   MONOABS 0.9 07/18/2014 2033   EOSABS 0.2 07/18/2014 2033   BASOSABS 0.0 07/18/2014 2033    CMP     Component Value  Date/Time   NA 138 07/19/2014 1105   K 4.4 07/19/2014 1105   CL 106 07/19/2014 1105   CO2 24 07/19/2014 1105   GLUCOSE 163* 07/19/2014 1105   BUN 11 07/19/2014 1105   CREATININE 0.92 07/19/2014 1105   CREATININE 1.11 06/14/2013 1221   CALCIUM 9.0 07/19/2014 1105   PROT  5.9* 07/18/2014 2033   ALBUMIN 3.3* 07/18/2014 2033   AST 17 07/18/2014 2033   ALT 14 07/18/2014 2033   ALKPHOS 58 07/18/2014 2033   BILITOT 0.4 07/18/2014 2033   GFRNONAA 80* 07/19/2014 1105   GFRAA >90 07/19/2014 1105    Assessment and Plan  Falls Suspected to be multifactorial. Significant peripheral neuropathy secondary to diabetes noted, with no sensation in left leg below knee in none in right leg below mid-calf. Orthostatics normal. History of BPPV treated with meclizine. Troponins negative. Echocardiogram showed EF 40-45%, moderate to severe LVH, left ventricular diastolic dysfunction, moderately dilated left ; admitted to SNF for OT/PT   Orthostatic hypotension 2/2 dehydration from presumed diarrhea in the weeks prior but none in hosp;C diff and GI path neg;IV not accessed, po's were pushedd and demadex stopped   Chronic systolic heart failure Reported stable this hospitalization   DM type 2, controlled, with complication Not reported to be an issue ;A1c 7.4;continue insulin;pt is on ACE and statin   Bilateral edema of lower extremity Apparently a chronic problem;will monitor along with weight     Hennie Duos, MD

## 2014-07-31 ENCOUNTER — Encounter: Payer: Self-pay | Admitting: Internal Medicine

## 2014-07-31 NOTE — Addendum Note (Signed)
Addended by: Inocencio Homes D on: 07/31/2014 09:21 PM   Modules accepted: Orders

## 2014-08-06 ENCOUNTER — Emergency Department (HOSPITAL_COMMUNITY): Payer: Medicare Other

## 2014-08-06 ENCOUNTER — Encounter (HOSPITAL_COMMUNITY): Payer: Self-pay | Admitting: Emergency Medicine

## 2014-08-06 ENCOUNTER — Emergency Department (HOSPITAL_COMMUNITY)
Admission: EM | Admit: 2014-08-06 | Discharge: 2014-08-06 | Disposition: A | Discharge status: Home / Self Care | Payer: Medicare Other | Attending: Emergency Medicine | Admitting: Emergency Medicine

## 2014-08-06 DIAGNOSIS — S79912A Unspecified injury of left hip, initial encounter: Secondary | ICD-10-CM | POA: Diagnosis not present

## 2014-08-06 DIAGNOSIS — I252 Old myocardial infarction: Secondary | ICD-10-CM | POA: Diagnosis not present

## 2014-08-06 DIAGNOSIS — S199XXA Unspecified injury of neck, initial encounter: Secondary | ICD-10-CM | POA: Diagnosis not present

## 2014-08-06 DIAGNOSIS — W010XXA Fall on same level from slipping, tripping and stumbling without subsequent striking against object, initial encounter: Secondary | ICD-10-CM | POA: Insufficient documentation

## 2014-08-06 DIAGNOSIS — E785 Hyperlipidemia, unspecified: Secondary | ICD-10-CM | POA: Diagnosis not present

## 2014-08-06 DIAGNOSIS — I129 Hypertensive chronic kidney disease with stage 1 through stage 4 chronic kidney disease, or unspecified chronic kidney disease: Secondary | ICD-10-CM | POA: Insufficient documentation

## 2014-08-06 DIAGNOSIS — Y92122 Bedroom in nursing home as the place of occurrence of the external cause: Secondary | ICD-10-CM | POA: Insufficient documentation

## 2014-08-06 DIAGNOSIS — Z9981 Dependence on supplemental oxygen: Secondary | ICD-10-CM | POA: Insufficient documentation

## 2014-08-06 DIAGNOSIS — Z7901 Long term (current) use of anticoagulants: Secondary | ICD-10-CM | POA: Diagnosis not present

## 2014-08-06 DIAGNOSIS — Z9861 Coronary angioplasty status: Secondary | ICD-10-CM | POA: Insufficient documentation

## 2014-08-06 DIAGNOSIS — E1140 Type 2 diabetes mellitus with diabetic neuropathy, unspecified: Secondary | ICD-10-CM | POA: Insufficient documentation

## 2014-08-06 DIAGNOSIS — G8929 Other chronic pain: Secondary | ICD-10-CM | POA: Diagnosis not present

## 2014-08-06 DIAGNOSIS — N400 Enlarged prostate without lower urinary tract symptoms: Secondary | ICD-10-CM | POA: Diagnosis not present

## 2014-08-06 DIAGNOSIS — Y9389 Activity, other specified: Secondary | ICD-10-CM | POA: Insufficient documentation

## 2014-08-06 DIAGNOSIS — S3992XA Unspecified injury of lower back, initial encounter: Secondary | ICD-10-CM | POA: Diagnosis not present

## 2014-08-06 DIAGNOSIS — Z951 Presence of aortocoronary bypass graft: Secondary | ICD-10-CM | POA: Diagnosis not present

## 2014-08-06 DIAGNOSIS — M25552 Pain in left hip: Secondary | ICD-10-CM | POA: Diagnosis not present

## 2014-08-06 DIAGNOSIS — E329 Disease of thymus, unspecified: Secondary | ICD-10-CM | POA: Diagnosis not present

## 2014-08-06 DIAGNOSIS — G9389 Other specified disorders of brain: Secondary | ICD-10-CM | POA: Diagnosis not present

## 2014-08-06 DIAGNOSIS — M47816 Spondylosis without myelopathy or radiculopathy, lumbar region: Secondary | ICD-10-CM | POA: Diagnosis not present

## 2014-08-06 DIAGNOSIS — M542 Cervicalgia: Secondary | ICD-10-CM | POA: Diagnosis not present

## 2014-08-06 DIAGNOSIS — G4730 Sleep apnea, unspecified: Secondary | ICD-10-CM | POA: Diagnosis not present

## 2014-08-06 DIAGNOSIS — Z79899 Other long term (current) drug therapy: Secondary | ICD-10-CM | POA: Insufficient documentation

## 2014-08-06 DIAGNOSIS — J449 Chronic obstructive pulmonary disease, unspecified: Secondary | ICD-10-CM | POA: Diagnosis not present

## 2014-08-06 DIAGNOSIS — I25119 Atherosclerotic heart disease of native coronary artery with unspecified angina pectoris: Secondary | ICD-10-CM | POA: Insufficient documentation

## 2014-08-06 DIAGNOSIS — Z8614 Personal history of Methicillin resistant Staphylococcus aureus infection: Secondary | ICD-10-CM | POA: Diagnosis not present

## 2014-08-06 DIAGNOSIS — Z87891 Personal history of nicotine dependence: Secondary | ICD-10-CM | POA: Diagnosis not present

## 2014-08-06 DIAGNOSIS — K219 Gastro-esophageal reflux disease without esophagitis: Secondary | ICD-10-CM | POA: Diagnosis not present

## 2014-08-06 DIAGNOSIS — F419 Anxiety disorder, unspecified: Secondary | ICD-10-CM | POA: Diagnosis not present

## 2014-08-06 DIAGNOSIS — Z86711 Personal history of pulmonary embolism: Secondary | ICD-10-CM | POA: Insufficient documentation

## 2014-08-06 DIAGNOSIS — N189 Chronic kidney disease, unspecified: Secondary | ICD-10-CM | POA: Insufficient documentation

## 2014-08-06 DIAGNOSIS — Z8673 Personal history of transient ischemic attack (TIA), and cerebral infarction without residual deficits: Secondary | ICD-10-CM | POA: Diagnosis not present

## 2014-08-06 DIAGNOSIS — Y998 Other external cause status: Secondary | ICD-10-CM | POA: Insufficient documentation

## 2014-08-06 DIAGNOSIS — M545 Low back pain: Secondary | ICD-10-CM | POA: Diagnosis not present

## 2014-08-06 DIAGNOSIS — E669 Obesity, unspecified: Secondary | ICD-10-CM | POA: Diagnosis not present

## 2014-08-06 DIAGNOSIS — Z794 Long term (current) use of insulin: Secondary | ICD-10-CM | POA: Diagnosis not present

## 2014-08-06 DIAGNOSIS — Z9889 Other specified postprocedural states: Secondary | ICD-10-CM | POA: Insufficient documentation

## 2014-08-06 DIAGNOSIS — W19XXXA Unspecified fall, initial encounter: Secondary | ICD-10-CM

## 2014-08-06 DIAGNOSIS — S0990XA Unspecified injury of head, initial encounter: Secondary | ICD-10-CM | POA: Diagnosis not present

## 2014-08-06 LAB — PROTIME-INR
INR: 2.16 — AB (ref 0.00–1.49)
PROTHROMBIN TIME: 24.3 s — AB (ref 11.6–15.2)

## 2014-08-06 NOTE — ED Notes (Signed)
[facility] called to transport pt back to facility 

## 2014-08-06 NOTE — ED Notes (Signed)
Green top placed in minilab, lavender, and light green tube sent to main lab with PT INR.

## 2014-08-06 NOTE — Discharge Instructions (Signed)
No significant injury was found from your fall.  Please follow up with your doctor for further care.  Fall Prevention in Hospitals As a hospital patient, your condition and the treatments you receive can increase your risk for falls. Some additional risk factors for falls in a hospital include:  Being in an unfamiliar environment.  Being on bed rest.  Your surgery.  Taking certain medicines.  Your tubing requirements, such as intravenous (IV) therapy or catheters. It is important that you learn how to decrease fall risks while at the hospital. Below are important tips that can help prevent falls. SAFETY TIPS FOR PREVENTING FALLS Talk about your risk of falling.  Ask your caregiver why you are at risk for falling. Is it your medicine, illness, tubing placement, or something else?  Make a plan with your caregiver to keep you safe from falls.  Ask your caregiver or pharmacist about side effect of your medicines. Some medicines can make you dizzy or affect your coordination. Ask for help.  Ask for help before getting out of bed. You may need to press your call button.  Ask for assistance in getting you safely to the toilet.  Ask for a walker or cane to be put at your bedside. Ask that most of the side rails on your bed be placed up before your caregiver leaves the room.  Ask family or friends to sit with you.  Ask for things that are out of your reach, such as your glasses, hearing aids, telephone, bedside table, or call button. Follow these tips to avoid falling:  Stay lying or seated, rather than standing, while waiting for help.  Wear rubber-soled slippers or shoes whenever you walk in the hospital.  Avoid quick, sudden movements.  Change positions slowly.  Sit on the side of your bed before standing.  Stand up slowly and wait before you start to walk.  Let your caregiver know if there is a spill on the floor.  Pay careful attention to the medical equipment,  electrical cords, and tubes around you.  When you need help, use your call button by your bed or in the bathroom. Wait for one of your caregivers to help you.  If you feel dizzy or unsure of your footing, return to bed and wait for assistance.  Avoid being distracted by the TV, telephone, or another person in your room.  Do not lean or support yourself on rolling objects, such as IV poles or bedside tables. Document Released: 03/07/2000 Document Revised: 02/25/2012 Document Reviewed: 11/16/2011 [facility] Patient Information 2015 [place], [place]. This information is not intended to replace advice given to you by your health care provider. Make sure you discuss any questions you have with your health care provider.

## 2014-08-06 NOTE — ED Notes (Signed)
PA at bedside.

## 2014-08-06 NOTE — ED Notes (Addendum)
Pt returned from CT °

## 2014-08-06 NOTE — ED Notes (Signed)
[facility] here to transport pt back to [facility] 

## 2014-08-06 NOTE — ED Notes (Signed)
Pt in XRAY 
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
## 2014-08-06 NOTE — ED Notes (Signed)
Pt transported to CT ?

## 2014-08-06 NOTE — ED Notes (Addendum)
Pt from [facility] c/o a fall while transferring from bed to wheelchair. Pt reports that he slipped while transferring.  Pt denies LOC or hitting head. Pt is on Warfarin. He reports "hurting all over" but denies new pain but c/o of left hip pain upon palpation to left hip. Old surgical scar noted to left hip. Weak pedal pulses and moderate radial pulses.Pt did not want to come to hospital. No obvious deformity.

## 2014-08-08 ENCOUNTER — Telehealth: Payer: Self-pay

## 2014-08-09 NOTE — Telephone Encounter (Signed)
Called back x2. Unable to leave message

## 2014-08-11 NOTE — Telephone Encounter (Signed)
Pt's wife called back. He went from hospital to rehab. Thinks he will be discharged next week but not sure. [phone]

## 2014-08-11 NOTE — Telephone Encounter (Signed)
LMTCB x 1 

## 2014-08-14 ENCOUNTER — Non-Acute Institutional Stay (SKILLED_NURSING_FACILITY): Payer: Medicare Other | Admitting: Internal Medicine

## 2014-08-14 ENCOUNTER — Encounter: Payer: Self-pay | Admitting: Internal Medicine

## 2014-08-14 DIAGNOSIS — I10 Essential (primary) hypertension: Secondary | ICD-10-CM | POA: Diagnosis not present

## 2014-08-14 DIAGNOSIS — I739 Peripheral vascular disease, unspecified: Secondary | ICD-10-CM | POA: Diagnosis not present

## 2014-08-14 DIAGNOSIS — R600 Localized edema: Secondary | ICD-10-CM

## 2014-08-14 DIAGNOSIS — E118 Type 2 diabetes mellitus with unspecified complications: Secondary | ICD-10-CM

## 2014-08-14 DIAGNOSIS — R2681 Unsteadiness on feet: Secondary | ICD-10-CM

## 2014-08-14 DIAGNOSIS — Z7901 Long term (current) use of anticoagulants: Secondary | ICD-10-CM

## 2014-08-14 NOTE — Progress Notes (Signed)
Patient ID: [name], male   DOB: 12/15/1940, 74 y.o.   MRN: [id number] MRN: [id number] Name: [name]  Sex: male Age: 74 y.o. DOB: [birth year]  [place] #: [facility] Facility/Room:108 Level Of Care: SNF Provider: Wille Celeste Emergency Contacts: Extended Emergency Contact Information Primary Emergency Contact: Lianne Moris Address: [place], [place] Mobile Phone: [phone] Relation: Spouse  Code Status:   Allergies: Vancomycin and Diazepam  Chief complaint-discharge note  HPI: Patient is 74 y.o. male who has been falling at home lately, was admitted to hospital for dehydration and wasadmitted to SNF for OT/PT He has a history of significant peripheral neuropathy secondary to diabetes-continues without sensation in his lower legs bilaterally-he also has a history of BP be treated with when necessary Antivert.  He has worked with therapy but continues with significant weakness and fall risk-he is largely ambulating in a wheelchair.  His stay here however has been relatively unremarkable he is on chronic Coumadin with a history of multiple pulmonary embolisms INR has been therapeutic update INR is pending this will need to be followed by home health and primary care provider as an outpatient. His blood sugars appear to be under decent control on NPH 15 mg twice a day morning sugars appear to be largely in the mid to higher 100s-this appears to be the case at noon as well although there occasionally is blood sugars above 200 At 4:30 sugars are somewhat higher in the higher 100s to low 200s.  He also continues on NovoLog sliding scale.  Tonight he has no acute complaints she will be going home with his wife.  Will need PT and OT as well as home health support   Past Medical History  Diagnosis Date  . Depression   . Diabetes mellitus   . COPD (chronic obstructive pulmonary disease)   . Adhesive capsulitis   .  MRSA bacteremia     2011 - possible endocarditis, received 6 weeks IV treatment  . Hyperlipidemia   . Anxiety   . GERD (gastroesophageal reflux disease)   . CAD (coronary artery disease)   . 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 CVA (cerebral infarction) Questionable history  . Diverticulosis   . Osteomyelitis 2012    Sternoclavicular joint   . Chronic back pain   . Angina   . Myocardial infarction   . CHF (congestive heart failure)   . Shortness of breath   . Sleep apnea   . Chronic kidney disease     hx of BPH  . Neuromuscular disorder     HX of diabetic periferal neuropathy  . Hypertension   . Peripheral vascular disease   . On home oxygen therapy     "1.5L prn" (04/12/2014)  . Falls frequently 06/2014    Past Surgical History  Procedure Laterality Date  . Septic arthritis      Removal of infected CABG wire by Dr Arlyce Dice - 2011  . Coronary artery bypass graft  1992  . Fracture surgery  1980s    Hip  . Spine surgery  2002    Cervical fusion vertebroplasty C3-4-5  . Coronary stent placement  1999, 2002  . Doppler echocardiography  Sept 2011    EF 50-55% with some impaired diastolic relaxation  . Colonoscopy  2006     Multiple polyps removed, repeat in 5 years  .  Right iliopopliteal bypass - [facility]  . Femoral-popliteal bypass graft  2008    Left  . Cardiac catheterization  October 18, 2010    Known obstructive disease, no further blockages  . Cardiac catheterization  03/17/2005    EF 35-40%  . US echocardiography  03/04/2006    EF 50-55%  . Amputation  03/16/2011    Procedure: AMPUTATION DIGIT;  Surgeon: Angelia Mould, MD;  Location: [facility] OR;  Service: Vascular;  Laterality: Left;  Great toe  . Hip arthroplasty Left       Medication List       This list is accurate as of: 08/14/14  5:55 PM.  Always use your most recent med list.               acetaminophen 650 MG CR tablet  Commonly known as:  TYLENOL  Take 650 mg by  mouth 2 (two) times daily as needed (for arthritis pain).     acetaminophen 325 MG tablet  Commonly known as:  TYLENOL  Take 650 mg by mouth every 6 (six) hours as needed for mild pain.     acetaminophen-codeine 300-60 MG per tablet  Commonly known as:  TYLENOL #4  Take 1 tablet by mouth every 8 (eight) hours as needed for moderate pain.     bisoprolol 5 MG tablet  Commonly known as:  ZEBETA  Take 0.5 tablets (2.5 mg total) by mouth daily.     glucose blood test strip  Use as instructed.  One Step Ultra 2 strips.     insulin aspart 100 UNIT/ML injection  Commonly known as:  NOVOLOG  Use sliding scale as prescribed:  0 - 150 0 units; 151 - 200 2 units, 201 - 250 4 units; 251 - 200 6 units, 301 - 350 8 units     insulin NPH Human 100 UNIT/ML injection  Commonly known as:  HUMULIN N,NOVOLIN N  Inject 0.15 mLs (15 Units total) into the skin 2 (two) times daily before a meal.     Insulin Syringe-Needle U-100 30G X 1/2" 0.5 ML Misc  Commonly known as:  B-D INS SYRINGE 0.5CC/30GX1/2"  For use with insulin     lidocaine 5 %  Commonly known as:  LIDODERM  Place 1 patch onto the skin daily. Remove & Discard patch within 12 hours or as directed by MD     lisinopril 2.5 MG tablet  Commonly known as:  PRINIVIL,ZESTRIL  Take 2.5 mg by mouth daily.     lisinopril 5 MG tablet  Commonly known as:  PRINIVIL,ZESTRIL  Take 0.5 tablets (2.5 mg total) by mouth daily.     LORazepam 0.5 MG tablet  Commonly known as:  ATIVAN  Take 0.5 tablets (0.25 mg total) by mouth at bedtime.     LORazepam 0.5 MG tablet  Commonly known as:  ATIVAN  Take 0.5 tablets (0.25 mg total) by mouth at bedtime.     meclizine 25 MG tablet  Commonly known as:  ANTIVERT  TAKE ONE TABLET BY MOUTH THREE TIMES DAILY AS NEEDED FOR  DIZZINESS     mirtazapine 15 MG tablet  Commonly known as:  REMERON  Take 1 tablet (15 mg total) by mouth at bedtime.     NEEDLE (DISP) 30 G 30G X 1" Misc  Commonly known as:  B-D DISP  NEEDLE 30GX1"  For using with insulin.     nitroGLYCERIN 0.4 MG SL tablet  Commonly known as:  NITROSTAT  Place 1 tablet (0.4 mg total) under the tongue as needed. For chest pains     omeprazole 40 MG capsule  Commonly known as:  PRILOSEC  Take 1 capsule (40 mg total) by mouth daily.     ONE TOUCH ULTRA 2 W/DEVICE Kit  Please supply patient with 1 glucometer device to use 4 times daily to check blood sugars.  Dx code: 250.0     simvastatin 80 MG tablet  Commonly known as:  ZOCOR  Take 1 tablet (80 mg total) by mouth daily.     tamsulosin 0.4 MG Caps capsule  Commonly known as:  FLOMAX  Take 1 capsule (0.4 mg total) by mouth daily after supper.     torsemide 20 MG tablet  Commonly known as:  DEMADEX  TAKE ONE TABLET BY MOUTH EVERY DAY     venlafaxine XR 37.5 MG 24 hr capsule  Commonly known as:  EFFEXOR-XR  TAKE ONE CAPSULE BY MOUTH ONCE DAILY     warfarin 5 MG tablet  Commonly known as:  COUMADIN  Take 1 tablet (5 mg total) by mouth daily at 6 PM.        No orders of the defined types were placed in this encounter.    Immunization History  Administered Date(s) Administered  . Influenza Split 12/12/2010  . Influenza Whole 12/22/2005, 12/14/2007  . Influenza,inj,Quad PF,36+ Mos 01/20/2013, 12/09/2013  . Pneumococcal Conjugate-13 10/10/2013  . Pneumococcal Polysaccharide-23 11/23/1999, 03/25/2003, 05/25/2011  . Td 10/23/2003  . Tdap 07/17/2014    History  Substance Use Topics  . Smoking status: Former Smoker    Quit date: 06/19/2000  . Smokeless tobacco: Never Used  . Alcohol Use: No    Family history is noncontributory    Review of Systems  DATA OBTAINED: from patient, nurse GENERAL:  no fevers, fatigue, appetite changes SKIN: No itching, rash or wounds EYES: No eye pain, redness, discharge EARS: No earache, tinnitus, change in hearing NOSE: No congestion, drainage or bleeding  MOUTH/THROAT: No mouth or tooth pain, No sore throat RESPIRATORY: No  cough, wheezing, SOB CARDIAC: No chest pain, palpitations, lower extremity edema  GI: No abdominal pain, No N/V/D or constipation, No heartburn or reflux  GU: No dysuria, frequency or urgency, or incontinence  MUSCULOSKELETAL: Has bilateral shoulder pain this is chronic-apparently medications including Tylenol with codeine is helping some NEUROLOGIC: No headache, dizziness or focal weakness PSYCHIATRIC: No overt anxiety or sadness, No behavior issue.   Filed Vitals:   08/14/14 1747  BP: 115/62  Pulse: 72  Temp: 96.8 F (36 C)  Resp: 20    Physical Exam  GENERAL APPEARANCE: Alert, No acute distress.  SKIN: No diaphoresis rash HEAD: Normocephalic, atraumatic  EYES: Conjunctiva/lids clear. Pupils round, reactive. EOMs intact.  EARS: External exam WNL, canals clear. Hearing grossly normal.  NOSE: No deformity or discharge.  MOUTH/THROAT: L oropharynx clear mucous membranes moist  RESPIRATORY: Breathing is even, unlabored. Lung sounds are clear somewhat shallow air entry   CARDIOVASCULAR: Heart RRR no murmurs, rubs or gallops. No peripheral edema.   GASTROINTESTINAL: Abdomen is soft, non-tender, not distended w/ normal bowel sounds. MUSCULOSKELETAL: No abnormal joints or musculature--tenderness to palpation of the shoulder areas bilaterally this is not new-continues with lower extremity weakness again has decreased touch sensation especially on the left lower legshe is largely ambulatory in a wheelchair NEUROLOGIC:  Cranial nerves 2-12 grossly intact--speech is clear-again he has significant lower extremity peripheral neuropathy PSYCHIATRIC: Mood and affect appropriate to situation, no behavioral issues  Patient Active Problem List   Diagnosis Date Noted  . Bilateral edema of lower extremity   . Dehydration 07/18/2014  . Diarrhea 07/18/2014  . Benign paroxysmal positional vertigo 05/26/2014  . OA (osteoarthritis) 05/12/2014  . DM type 2, controlled, with complication 32/67/1245  .  Unsteady gait 04/12/2014  . Orthostatic hypotension 05/16/2013  . Generalized weakness 04/30/2013  . At high risk for falls 07/19/2012  . Vitamin D deficiency 03/12/2012  . Falls 02/17/2012  . Bradycardia 01/22/2012  . Hemorrhoid 01/12/2012  . GERD (gastroesophageal reflux disease) 11/10/2011  . Leg edema 09/20/2011  . Peripheral vascular disease 04/16/2011  . Vancomycin adverse reaction 03/13/2011  . COPD (chronic obstructive pulmonary disease) 02/27/2011  . Stasis dermatitis 12/04/2010  . BPH (benign prostatic hyperplasia) 11/01/2010  . Long term current use of anticoagulant therapy 05/03/2010  . FROZEN RIGHT SHOULDER 02/25/2010  . DIABETIC PERIPHERAL NEUROPATHY 12/20/2009  . Depression 07/08/2006  . Essential hypertension 07/08/2006  . HYPERCHOLESTEROLEMIA 05/21/2006  . Anxiety state 05/21/2006  . CORONARY, ARTERIOSCLEROSIS 05/21/2006  . Chronic systolic heart failure 80/99/8338  . REFLUX ESOPHAGITIS 05/21/2006  . APNEA, SLEEP 05/21/2006    CBC    Component Value Date/Time   WBC 9.4 07/19/2014 1105   RBC 5.25 07/19/2014 1105   HGB 14.1 07/19/2014 1105   HCT 43.2 07/19/2014 1105   PLT 153 07/19/2014 1105   MCV 82.3 07/19/2014 1105   LYMPHSABS 2.1 07/18/2014 2033   MONOABS 0.9 07/18/2014 2033   EOSABS 0.2 07/18/2014 2033   BASOSABS 0.0 07/18/2014 2033    CMP     Component Value Date/Time   NA 138 07/19/2014 1105   K 4.4 07/19/2014 1105   CL 106 07/19/2014 1105   CO2 24 07/19/2014 1105   GLUCOSE 163* 07/19/2014 1105   BUN 11 07/19/2014 1105   CREATININE 0.92 07/19/2014 1105   CREATININE 1.11 06/14/2013 1221   CALCIUM 9.0 07/19/2014 1105   PROT 5.9* 07/18/2014 2033   ALBUMIN 3.3* 07/18/2014 2033   AST 17 07/18/2014 2033   ALT 14 07/18/2014 2033   ALKPHOS 58 07/18/2014 2033   BILITOT 0.4 07/18/2014 2033   GFRNONAA 80* 07/19/2014 1105   GFRAA >90 07/19/2014 1105    Assessment and Plan  #1-history of falls suspected to be multifactorial-again he has  significant peripheral neuropathy secondary to diabetes-he will need continued PT and OT. Close follow-up-.  He is on numerous pain medications including Tylenol with Codeine.  #2-history of diabetes type 2-at this point will not change his medication   Since he  is about to go home continues on NPH twice a day as well as NovoLog sliding scale -.  #3-history of multiple pulmonary embolisms on chronic Coumadin-INR is pending again this will have to be followed as an outpatient with INR drawn by home health primary care provider notified of results.  follow-up   With PCP   [id number] systolic heart failure-he is on low-dose Demadex-will update a metabolic panel before discharge clinically he appears to be stable.  #5-history of vertigo continues on Antivert when necessary apparently this has not been an issue during his stay here.  #6-history of shoulder pain-again he is receiving Tylenol with codeine-apparently this is chronic.-Also has a topical gel  #7 hypertension-this appears to be relatively stable recent blood pressures 115/62-112/82-he continues on low-dose lisinopril 2.5 mg a day--as well as Zebeta  #8-history of BPH he continues on Flomax apparently this has not been an issue during his stay here.  #9 history depression  he is on Effexor-he is also on Remeron apparently Remeron was started previously by psychiatric nurse practitioner for insomnia.  Of note patient will need a CBC and metabolic panel before discharge-will need PT OT and home health support when he goes home as well as expedient follow-up of primary care provider.  [id number] note greater than 30 minutes spent on this discharge summary prescriptions have been written        ,

## 2014-08-14 NOTE — Telephone Encounter (Signed)
Called and spoke to pt's wife. Pt is to be discharge from SNF on 5/25 and to call back then to make an appt with pt. WCB.

## 2014-08-17 DIAGNOSIS — I10 Essential (primary) hypertension: Secondary | ICD-10-CM | POA: Diagnosis not present

## 2014-08-17 DIAGNOSIS — I5022 Chronic systolic (congestive) heart failure: Secondary | ICD-10-CM | POA: Diagnosis not present

## 2014-08-17 DIAGNOSIS — I2510 Atherosclerotic heart disease of native coronary artery without angina pectoris: Secondary | ICD-10-CM | POA: Diagnosis not present

## 2014-08-17 DIAGNOSIS — Z5181 Encounter for therapeutic drug level monitoring: Secondary | ICD-10-CM | POA: Diagnosis not present

## 2014-08-17 DIAGNOSIS — J449 Chronic obstructive pulmonary disease, unspecified: Secondary | ICD-10-CM | POA: Diagnosis not present

## 2014-08-17 DIAGNOSIS — M1990 Unspecified osteoarthritis, unspecified site: Secondary | ICD-10-CM | POA: Diagnosis not present

## 2014-08-17 DIAGNOSIS — F419 Anxiety disorder, unspecified: Secondary | ICD-10-CM | POA: Diagnosis not present

## 2014-08-17 DIAGNOSIS — Z7901 Long term (current) use of anticoagulants: Secondary | ICD-10-CM | POA: Diagnosis not present

## 2014-08-17 DIAGNOSIS — N400 Enlarged prostate without lower urinary tract symptoms: Secondary | ICD-10-CM | POA: Diagnosis not present

## 2014-08-17 DIAGNOSIS — E1140 Type 2 diabetes mellitus with diabetic neuropathy, unspecified: Secondary | ICD-10-CM | POA: Diagnosis not present

## 2014-08-17 DIAGNOSIS — F329 Major depressive disorder, single episode, unspecified: Secondary | ICD-10-CM | POA: Diagnosis not present

## 2014-08-17 DIAGNOSIS — I739 Peripheral vascular disease, unspecified: Secondary | ICD-10-CM | POA: Diagnosis not present

## 2014-08-17 DIAGNOSIS — G8929 Other chronic pain: Secondary | ICD-10-CM | POA: Diagnosis not present

## 2014-08-18 ENCOUNTER — Telehealth: Payer: Self-pay | Admitting: Family Medicine

## 2014-08-18 NOTE — Telephone Encounter (Signed)
[facility]: Needs to talk to dr Mingo Amber about home health orders and discuss plan of care

## 2014-08-18 NOTE — Telephone Encounter (Signed)
Is there a number available to talk with the [place] representative?  Or does this note mean that [name] is calling because [name] would like to talk with me?  If unable to find answer, please call [name] and ask.  I'd be happy to talk with him if that's what he desires.

## 2014-08-18 NOTE — Telephone Encounter (Signed)
lmtcb for pt to schedule a HFU.

## 2014-08-22 ENCOUNTER — Telehealth: Payer: Self-pay | Admitting: Family Medicine

## 2014-08-22 NOTE — Telephone Encounter (Signed)
lmtcb X2 to schedule hfu

## 2014-08-22 NOTE — Telephone Encounter (Signed)
Needs refill on test strips [facility] on [address]

## 2014-08-22 NOTE — Telephone Encounter (Signed)
Mandy called to get verbal orders for the patient. She is looking for 1 time a week for 5 weeks for skilled nursing, medication management, PTINR . She called the INR on 08/17/14 for the patient it was 2.3 and he is taking 5 mil daily. She never heard back from our office and is wondering was she supposed keep his medication the same. jw

## 2014-08-23 MED ORDER — GLUCOSE BLOOD VI STRP: ORAL_STRIP | Status: DC

## 2014-08-23 NOTE — Telephone Encounter (Signed)
lmtcb x3 

## 2014-08-23 NOTE — Telephone Encounter (Signed)
I received a fax in my box asking this very question.  I signed the "keep medicine the same" area and placed in the to be faxed box.  He should remain on 5 mg Coumadin.

## 2014-08-23 NOTE — Telephone Encounter (Signed)
Clarified with Dr. Mingo Amber that he is ok with Verbal orders for skilled nursing as well.   LMOVM of Mandy informing her of below. [place], Salome Spotted

## 2014-08-24 ENCOUNTER — Emergency Department (HOSPITAL_COMMUNITY): Payer: Medicare Other

## 2014-08-24 ENCOUNTER — Telehealth: Payer: Self-pay | Admitting: Family Medicine

## 2014-08-24 ENCOUNTER — Emergency Department (HOSPITAL_COMMUNITY)
Admission: EM | Admit: 2014-08-24 | Discharge: 2014-08-24 | Disposition: A | Discharge status: Home / Self Care | Payer: Medicare Other | Attending: Emergency Medicine | Admitting: Emergency Medicine

## 2014-08-24 ENCOUNTER — Encounter (HOSPITAL_COMMUNITY): Payer: Self-pay | Admitting: Emergency Medicine

## 2014-08-24 DIAGNOSIS — Z794 Long term (current) use of insulin: Secondary | ICD-10-CM | POA: Insufficient documentation

## 2014-08-24 DIAGNOSIS — I25119 Atherosclerotic heart disease of native coronary artery with unspecified angina pectoris: Secondary | ICD-10-CM | POA: Insufficient documentation

## 2014-08-24 DIAGNOSIS — Z8673 Personal history of transient ischemic attack (TIA), and cerebral infarction without residual deficits: Secondary | ICD-10-CM | POA: Diagnosis not present

## 2014-08-24 DIAGNOSIS — Z9889 Other specified postprocedural states: Secondary | ICD-10-CM | POA: Diagnosis not present

## 2014-08-24 DIAGNOSIS — Z79899 Other long term (current) drug therapy: Secondary | ICD-10-CM | POA: Diagnosis not present

## 2014-08-24 DIAGNOSIS — K219 Gastro-esophageal reflux disease without esophagitis: Secondary | ICD-10-CM | POA: Diagnosis not present

## 2014-08-24 DIAGNOSIS — Z9861 Coronary angioplasty status: Secondary | ICD-10-CM | POA: Diagnosis not present

## 2014-08-24 DIAGNOSIS — Y998 Other external cause status: Secondary | ICD-10-CM | POA: Insufficient documentation

## 2014-08-24 DIAGNOSIS — Z9981 Dependence on supplemental oxygen: Secondary | ICD-10-CM | POA: Insufficient documentation

## 2014-08-24 DIAGNOSIS — E119 Type 2 diabetes mellitus without complications: Secondary | ICD-10-CM | POA: Insufficient documentation

## 2014-08-24 DIAGNOSIS — Z951 Presence of aortocoronary bypass graft: Secondary | ICD-10-CM | POA: Diagnosis not present

## 2014-08-24 DIAGNOSIS — G8929 Other chronic pain: Secondary | ICD-10-CM | POA: Insufficient documentation

## 2014-08-24 DIAGNOSIS — F419 Anxiety disorder, unspecified: Secondary | ICD-10-CM | POA: Diagnosis not present

## 2014-08-24 DIAGNOSIS — Z86711 Personal history of pulmonary embolism: Secondary | ICD-10-CM | POA: Insufficient documentation

## 2014-08-24 DIAGNOSIS — N189 Chronic kidney disease, unspecified: Secondary | ICD-10-CM | POA: Diagnosis not present

## 2014-08-24 DIAGNOSIS — J449 Chronic obstructive pulmonary disease, unspecified: Secondary | ICD-10-CM | POA: Diagnosis not present

## 2014-08-24 DIAGNOSIS — Z471 Aftercare following joint replacement surgery: Secondary | ICD-10-CM | POA: Diagnosis not present

## 2014-08-24 DIAGNOSIS — Y9289 Other specified places as the place of occurrence of the external cause: Secondary | ICD-10-CM | POA: Diagnosis not present

## 2014-08-24 DIAGNOSIS — W010XXA Fall on same level from slipping, tripping and stumbling without subsequent striking against object, initial encounter: Secondary | ICD-10-CM | POA: Diagnosis not present

## 2014-08-24 DIAGNOSIS — Y9389 Activity, other specified: Secondary | ICD-10-CM | POA: Insufficient documentation

## 2014-08-24 DIAGNOSIS — Z9181 History of falling: Secondary | ICD-10-CM | POA: Insufficient documentation

## 2014-08-24 DIAGNOSIS — S79912A Unspecified injury of left hip, initial encounter: Secondary | ICD-10-CM | POA: Diagnosis not present

## 2014-08-24 DIAGNOSIS — M25559 Pain in unspecified hip: Secondary | ICD-10-CM

## 2014-08-24 DIAGNOSIS — W19XXXA Unspecified fall, initial encounter: Secondary | ICD-10-CM

## 2014-08-24 DIAGNOSIS — Z8619 Personal history of other infectious and parasitic diseases: Secondary | ICD-10-CM | POA: Diagnosis not present

## 2014-08-24 DIAGNOSIS — S299XXA Unspecified injury of thorax, initial encounter: Secondary | ICD-10-CM | POA: Diagnosis not present

## 2014-08-24 DIAGNOSIS — E785 Hyperlipidemia, unspecified: Secondary | ICD-10-CM | POA: Insufficient documentation

## 2014-08-24 DIAGNOSIS — M25552 Pain in left hip: Secondary | ICD-10-CM

## 2014-08-24 DIAGNOSIS — I129 Hypertensive chronic kidney disease with stage 1 through stage 4 chronic kidney disease, or unspecified chronic kidney disease: Secondary | ICD-10-CM | POA: Insufficient documentation

## 2014-08-24 DIAGNOSIS — Z96642 Presence of left artificial hip joint: Secondary | ICD-10-CM | POA: Diagnosis not present

## 2014-08-24 DIAGNOSIS — I252 Old myocardial infarction: Secondary | ICD-10-CM | POA: Insufficient documentation

## 2014-08-24 DIAGNOSIS — Z7901 Long term (current) use of anticoagulants: Secondary | ICD-10-CM | POA: Insufficient documentation

## 2014-08-24 DIAGNOSIS — Z87891 Personal history of nicotine dependence: Secondary | ICD-10-CM | POA: Insufficient documentation

## 2014-08-24 DIAGNOSIS — I509 Heart failure, unspecified: Secondary | ICD-10-CM | POA: Insufficient documentation

## 2014-08-24 DIAGNOSIS — R079 Chest pain, unspecified: Secondary | ICD-10-CM | POA: Diagnosis not present

## 2014-08-24 LAB — COMPREHENSIVE METABOLIC PANEL
ALK PHOS: 63 U/L (ref 38–126)
ALT: 13 U/L — ABNORMAL LOW (ref 17–63)
AST: 16 U/L (ref 15–41)
Albumin: 4.2 g/dL (ref 3.5–5.0)
Anion gap: 14 (ref 5–15)
BILIRUBIN TOTAL: 0.5 mg/dL (ref 0.3–1.2)
BUN: 16 mg/dL (ref 6–20)
CO2: 27 mmol/L (ref 22–32)
Calcium: 9.1 mg/dL (ref 8.9–10.3)
Chloride: 104 mmol/L (ref 101–111)
Creatinine, Ser: 1.09 mg/dL (ref 0.61–1.24)
GFR calc non Af Amer: >60 mL/min (ref >60)
GLUCOSE: 144 mg/dL — AB (ref 65–99)
Potassium: 4 mmol/L (ref 3.5–5.1)
Sodium: 145 mmol/L (ref 135–145)
TOTAL PROTEIN: 7.2 g/dL (ref 6.5–8.1)

## 2014-08-24 LAB — CBC WITH DIFFERENTIAL/PLATELET
Basophils Absolute: 0 10*3/uL (ref 0.0–0.1)
Basophils Relative: 0 % (ref 0–1)
EOS ABS: 0.2 10*3/uL (ref 0.0–0.7)
EOS PCT: 2 % (ref 0–5)
HCT: 43.1 % (ref 39.0–52.0)
Hemoglobin: 13.5 g/dL (ref 13.0–17.0)
Lymphocytes Relative: 16 % (ref 12–46)
Lymphs Abs: 1.8 10*3/uL (ref 0.7–4.0)
MCH: 25.9 pg — AB (ref 26.0–34.0)
MCHC: 31.3 g/dL (ref 30.0–36.0)
MCV: 82.6 fL (ref 78.0–100.0)
MONO ABS: 0.9 10*3/uL (ref 0.1–1.0)
Monocytes Relative: 8 % (ref 3–12)
Neutro Abs: 8.1 10*3/uL — ABNORMAL HIGH (ref 1.7–7.7)
Neutrophils Relative %: 74 % (ref 43–77)
PLATELETS: 164 10*3/uL (ref 150–400)
RBC: 5.22 MIL/uL (ref 4.22–5.81)
RDW: 16.4 % — AB (ref 11.5–15.5)
WBC: 11 10*3/uL — ABNORMAL HIGH (ref 4.0–10.5)

## 2014-08-24 LAB — I-STAT TROPONIN, ED: Troponin i, poc: 0.01 ng/mL (ref 0.00–0.08)

## 2014-08-24 LAB — URINALYSIS, ROUTINE W REFLEX MICROSCOPIC
BILIRUBIN URINE: NEGATIVE
Glucose, UA: NEGATIVE mg/dL
Hgb urine dipstick: NEGATIVE
Ketones, ur: NEGATIVE mg/dL
Leukocytes, UA: NEGATIVE
NITRITE: NEGATIVE
Protein, ur: NEGATIVE mg/dL
Specific Gravity, Urine: 1.008 (ref 1.005–1.030)
Urobilinogen, UA: 0.2 mg/dL (ref 0.0–1.0)
pH: 6.5 (ref 5.0–8.0)

## 2014-08-24 LAB — CK: Total CK: 79 U/L (ref 49–397)

## 2014-08-24 LAB — PROTIME-INR
INR: 1.86 — ABNORMAL HIGH (ref 0.00–1.49)
PROTHROMBIN TIME: 21.4 s — AB (ref 11.6–15.2)

## 2014-08-24 MED ORDER — SODIUM CHLORIDE 0.9 % IV BOLUS (SEPSIS)
500.0000 mL | Freq: Once | INTRAVENOUS | Status: AC
  Administered 2014-08-24: 500 mL via INTRAVENOUS

## 2014-08-24 NOTE — ED Notes (Signed)
Bed: WA08 Expected date:  Expected time:  Means of arrival:  Comments: EMS- 74yo M, fall, joint pain

## 2014-08-24 NOTE — ED Notes (Signed)
Pt reports he was attempting to get in bed last night and slipped off the low mattress to the floor. The patient has been receiving home health, and has poor mobility from arthritis and CHF. He reports that he feels normal today but that his chronic pain in his shoulders is worse. His wife got him blankets and he slept on the floor and they called for assistance to get him up this AM. [facility] brought him in partly due to [facility] RN stating that he has become weaker and weaker since d/c from SNF where he was for rehab. Arrives in NAD with VSS.

## 2014-08-24 NOTE — Telephone Encounter (Signed)
Called spoke with patient's spouse  She sadly informed me that pt is "on his way back to the hospital" because he had fallen again and she was unable to help him up Pt had just returned home from SNF on 5.25.16  Advised spouse to please give our office a call once pt is discharged to schedule HFU Spouse voiced her understanding Will sign off d/t multiple attempts at scheduling HFU and pt's likely readmission

## 2014-08-24 NOTE — ED Notes (Signed)
Nurse currently starting IV 

## 2014-08-24 NOTE — Telephone Encounter (Signed)
Arville Go called to inform Dr. Mingo Amber that the patient fell last night and stayed on the floor all night until they showed up and EMS was called and he is currently at [facility]. [name]

## 2014-08-24 NOTE — ED Provider Notes (Signed)
CSN: [id number]     Arrival date & time 08/24/14  1041 History   First MD Initiated Contact with Patient 08/24/14 1114     Chief Complaint  Patient presents with  . Fall     (Consider location/radiation/quality/duration/timing/severity/associated sxs/prior Treatment) HPI Comments: Patient is a 74 year old male with extensive past medical history including diabetes, coronary artery disease, COPD, CVA, hypertension. He presents for evaluation of a fall which occurred yesterday evening. He is apparently trying to get in the bed yesterday when he slipped and ended up on the floor. He denies having struck his head. He does report some pain in his right hip, however this does not seem worse than what he is experienced in the past. He states that he was unable to get up off the floor his wife was unable to assist him. He apparently spent the evening on the floor for his wife called EMS this morning and he was brought here.  He recently was admitted for weakness and dehydration. He was discharged to a skilled nursing facility where he was there for rehabilitation for approximately one month. He recently arrived home prior to this fall.  Patient is a 74 y.o. male presenting with fall. The history is provided by the patient.  Fall This is a new problem. Episode onset: Last night. The problem occurs constantly. The problem has not changed since onset.Pertinent negatives include no chest pain and no abdominal pain. Nothing aggravates the symptoms. Nothing relieves the symptoms. He has tried nothing for the symptoms. The treatment provided no relief.    Past Medical History  Diagnosis Date  . Depression   . Diabetes mellitus   . COPD (chronic obstructive pulmonary disease)   . Adhesive capsulitis   . MRSA bacteremia     2011 - possible endocarditis, received 6 weeks IV treatment  . Hyperlipidemia   . Anxiety   . GERD (gastroesophageal reflux disease)   . CAD (coronary artery disease)   . PE  (pulmonary embolism) 10-15 years ago    Lifelong Coumadin  . CVA (cerebral infarction) Questionable history  . Diverticulosis   . Osteomyelitis 2012    Sternoclavicular joint   . Chronic back pain   . Angina   . Myocardial infarction   . CHF (congestive heart failure)   . Shortness of breath   . Sleep apnea   . Chronic kidney disease     hx of BPH  . Neuromuscular disorder     HX of diabetic periferal neuropathy  . Hypertension   . Peripheral vascular disease   . On home oxygen therapy     "1.5L prn" (04/12/2014)  . Falls frequently 06/2014   Past Surgical History  Procedure Laterality Date  . Septic arthritis      Removal of infected CABG wire by Dr Arlyce Dice - 2011  . Coronary artery bypass graft  1992  . Fracture surgery  1980s    Hip  . Spine surgery  2002    Cervical fusion vertebroplasty C3-4-5  . Coronary stent placement  1999, 2002  . Doppler echocardiography  Sept 2011    EF 50-55% with some impaired diastolic relaxation  . Colonoscopy  2006     Multiple polyps removed, repeat in 5 years  . Right iliopopliteal bypass - [place]  . Femoral-popliteal bypass graft  2008    Left  . Cardiac catheterization  October 18, 2010    Known obstructive disease, no further blockages  . Cardiac catheterization  03/17/2005  EF 35-40%  . US echocardiography  03/04/2006    EF 50-55%  . 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 Hip arthroplasty Left    Family History  Problem Relation Age of Onset  . Heart disease Father   . Heart disease Mother    History  Substance Use Topics  . Smoking status: Former Smoker    Quit date: 06/19/2000  . Smokeless tobacco: Never Used  . Alcohol Use: No    Review of Systems  Cardiovascular: Negative for chest pain.  Gastrointestinal: Negative for abdominal pain.  All other systems reviewed and are  negative.     Allergies  Vancomycin and Diazepam  Home Medications   Prior to Admission medications   Medication Sig Start Date End Date Taking? Authorizing Provider  acetaminophen (TYLENOL) 325 MG tablet Take 650 mg by mouth every 6 (six) hours as needed for mild pain.    Historical Provider, MD  acetaminophen (TYLENOL) 650 MG CR tablet Take 650 mg by mouth 2 (two) times daily as needed (for arthritis pain).     Historical Provider, MD  acetaminophen-codeine (TYLENOL #4) 300-60 MG per tablet Take 1 tablet by mouth every 8 (eight) hours as needed for moderate pain. 07/06/14   Alveda Reasons, MD  bisoprolol (ZEBETA) 5 MG tablet Take 0.5 tablets (2.5 mg total) by mouth daily. 05/10/14   Alveda Reasons, MD  Blood Glucose Monitoring Suppl (ONE TOUCH ULTRA 2) W/DEVICE KIT Please supply patient with 1 glucometer device to use 4 times daily to check blood sugars.  Dx code: 250.0 10/19/13   Alveda Reasons, MD  glucose blood test strip Use as instructed.  One Step Ultra 2 strips. 08/23/14   Alveda Reasons, MD  insulin aspart (NOVOLOG) 100 UNIT/ML injection Use sliding scale as prescribed:  0 - 150 0 units; 151 - 200 2 units, 201 - 250 4 units; 251 - 200 6 units, 301 - 350 8 units Patient taking differently: Inject 0-10 Units into the skin 3 (three) times daily. Use sliding scale as prescribed:0-150=0 units, 151-200=2 units, 201-250=4 units, 251-300=6 units, 301-350=8 units, 351-400=10 units, if CBG is greater than 401 call MD 06/06/14   Alveda Reasons, MD  insulin NPH Human (HUMULIN N,NOVOLIN N) 100 UNIT/ML injection Inject 0.15 mLs (15 Units total) into the skin 2 (two) times daily before a meal. Patient taking differently: Inject 15 Units into the skin 2 (two) times daily. Inject 15 units twice daily 06/06/14   Alveda Reasons, MD  Insulin Syringe-Needle U-100 (B-D INS SYRINGE 0.5CC/30GX1/2") 30G X 1/2" 0.5 ML MISC For use with insulin 01/20/14   Alveda Reasons, MD  lidocaine (LIDODERM) 5 %  Place 1 patch onto the skin daily. Remove & Discard patch within 12 hours or as directed by MD 05/05/14   Alveda Reasons, MD  lisinopril (PRINIVIL,ZESTRIL) 2.5 MG tablet Take 2.5 mg by mouth daily.  06/06/14   Historical Provider, MD  lisinopril (PRINIVIL,ZESTRIL) 5 MG tablet Take 0.5 tablets (2.5 mg total) by mouth daily. Patient not taking: Reported on 07/18/2014 07/10/14   Alveda Reasons, MD  LORazepam (ATIVAN) 0.5 MG tablet Take 0.5 tablets (0.25 mg total) by mouth at bedtime. 07/06/14   Alveda Reasons, MD  LORazepam (ATIVAN) 0.5 MG tablet Take 0.5 tablets (0.25 mg total) by mouth at bedtime. Patient not taking:  Reported on 08/06/2014 07/21/14   Veatrice Bourbon, MD  meclizine (ANTIVERT) 25 MG tablet TAKE ONE TABLET BY MOUTH THREE TIMES DAILY AS NEEDED FOR  DIZZINESS Patient taking differently: Take 25 mg by mouth 3 (three) times daily as needed for dizziness.  07/14/14   Alveda Reasons, MD  mirtazapine (REMERON) 15 MG tablet Take 1 tablet (15 mg total) by mouth at bedtime. 07/10/14   Alveda Reasons, MD  NEEDLE, DISP, 30 G (B-D DISP NEEDLE 30GX1") 30G X 1" MISC For using with insulin. 01/20/14   Alveda Reasons, MD  nitroGLYCERIN (NITROSTAT) 0.4 MG SL tablet Place 1 tablet (0.4 mg total) under the tongue as needed. For chest pains Patient taking differently: Place 0.4 mg under the tongue as needed for chest pain.  10/19/13   Alveda Reasons, MD  omeprazole (PRILOSEC) 40 MG capsule Take 1 capsule (40 mg total) by mouth daily. 05/10/14   Alveda Reasons, MD  simvastatin (ZOCOR) 80 MG tablet Take 1 tablet (80 mg total) by mouth daily. 06/06/14   Alveda Reasons, MD  tamsulosin (FLOMAX) 0.4 MG CAPS capsule Take 1 capsule (0.4 mg total) by mouth daily after supper. 10/19/13   Alveda Reasons, MD  torsemide (DEMADEX) 20 MG tablet TAKE ONE TABLET BY MOUTH EVERY DAY Patient taking differently: Take 20 mg by mouth daily.  07/10/14   Alveda Reasons, MD  venlafaxine XR (EFFEXOR-XR) 37.5 MG 24 hr capsule  TAKE ONE CAPSULE BY MOUTH ONCE DAILY Patient taking differently: Take 37.5 mg by mouth daily.  05/05/14   Alveda Reasons, MD  warfarin (COUMADIN) 5 MG tablet Take 1 tablet (5 mg total) by mouth daily at 6 PM. 06/28/14   Alveda Reasons, MD   BP 122/106 mmHg  Pulse 70  Temp(Src) 98.6 F (37 C)  SpO2 94% Physical Exam  Constitutional: He is oriented to person, place, and time. He appears well-developed and well-nourished. No distress.  HENT:  Head: Normocephalic and atraumatic.  Mouth/Throat: Oropharynx is clear and moist.  Eyes: EOM are normal. Pupils are equal, round, and reactive to light.  Neck: Normal range of motion. Neck supple.  Cardiovascular: Normal rate, regular rhythm and normal heart sounds.   No murmur heard. Pulmonary/Chest: Effort normal and breath sounds normal. No respiratory distress. He has no wheezes.  Abdominal: Soft. Bowel sounds are normal. He exhibits no distension. There is no tenderness.  Musculoskeletal: Normal range of motion. He exhibits no edema.  Lymphadenopathy:    He has no cervical adenopathy.  Neurological: He is alert and oriented to person, place, and time.  Skin: Skin is warm and dry. He is not diaphoretic.  Nursing note and vitals reviewed.   ED Course  Procedures (including critical care time) Labs Review Labs Reviewed  COMPREHENSIVE METABOLIC PANEL  CBC WITH DIFFERENTIAL/PLATELET  PROTIME-INR  URINALYSIS, ROUTINE W REFLEX MICROSCOPIC (NOT AT [facility])  CK  I-STAT TROPOININ, ED    Imaging Review No results found.  ED ECG REPORT   Date: 08/24/2014  Rate: 63  Rhythm: normal sinus rhythm  QRS Axis: normal  Intervals: normal  ST/T Wave abnormalities: normal  Conduction Disutrbances:left bundle branch block  Narrative Interpretation:   Old EKG Reviewed: unchanged  I have personally reviewed the EKG tracing and agree with the computerized printout as noted.   MDM   Final diagnoses:  Fall    Patient presents here with  complaints of frequent falls. He reports chronic pain in his left leg  that is making it difficult for him to get up. His x-rays are negative and laboratory studies today are otherwise unremarkable. Patient reports having difficulty getting around at home. I've discussed the possibility of admission with the family medicine resident and his attending, Dr. McDiarmid, both of whom do not feel as though the patient meets inpatient criteria. I have informed the patient of this who then became angry, tore off his EKG and pulse ox leads, and demanded to leave.    Veryl Speak, MD 08/24/14 [phone]

## 2014-08-24 NOTE — Discharge Instructions (Signed)
Continue your medications as before.  Follow-up with your primary Dr. in the next week to be rechecked.   Hip Pain Your hip is the joint between your upper legs and your lower pelvis. The bones, cartilage, tendons, and muscles of your hip joint perform a lot of work each day supporting your body weight and allowing you to move around. Hip pain can range from a minor ache to severe pain in one or both of your hips. Pain may be felt on the inside of the hip joint near the groin, or the outside near the buttocks and upper thigh. You may have swelling or stiffness as well.  HOME CARE INSTRUCTIONS   Take medicines only as directed by your health care provider.  Apply ice to the injured area:  Put ice in a plastic bag.  Place a towel between your skin and the bag.  Leave the ice on for 15-20 minutes at a time, 3-4 times a day.  Keep your leg raised (elevated) when possible to lessen swelling.  Avoid activities that cause pain.  Follow specific exercises as directed by your health care provider.  Sleep with a pillow between your legs on your most comfortable side.  Record how often you have hip pain, the location of the pain, and what it feels like. SEEK MEDICAL CARE IF:   You are unable to put weight on your leg.  Your hip is red or swollen or very tender to touch.  Your pain or swelling continues or worsens after 1 week.  You have increasing difficulty walking.  You have a fever. SEEK IMMEDIATE MEDICAL CARE IF:   You have fallen.  You have a sudden increase in pain and swelling in your hip. MAKE SURE YOU:   Understand these instructions.  Will watch your condition.  Will get help right away if you are not doing well or get worse. Document Released: 08/28/2009 Document Revised: 07/25/2013 Document Reviewed: 11/04/2012 [facility] Patient Information 2015 [place], [place]. This information is not intended to replace advice given to you by your health care provider. Make sure  you discuss any questions you have with your health care provider.

## 2014-08-25 NOTE — Telephone Encounter (Signed)
Thank you for the update.  I'm sorry [name] fell.  I haven't seen him since he left the SNF though I've told Butch Penny to bring him in.

## 2014-08-29 ENCOUNTER — Ambulatory Visit (INDEPENDENT_AMBULATORY_CARE_PROVIDER_SITE_OTHER): Payer: Medicare Other | Admitting: *Deleted

## 2014-08-29 ENCOUNTER — Encounter: Payer: Self-pay | Admitting: Family Medicine

## 2014-08-29 ENCOUNTER — Telehealth: Payer: Self-pay | Admitting: Family Medicine

## 2014-08-29 ENCOUNTER — Telehealth: Payer: Self-pay

## 2014-08-29 ENCOUNTER — Ambulatory Visit (INDEPENDENT_AMBULATORY_CARE_PROVIDER_SITE_OTHER): Payer: Medicare Other | Admitting: Family Medicine

## 2014-08-29 VITALS — BP 100/43 | Temp 97.9°F | Ht 68.0 in | Wt 255.1 lb

## 2014-08-29 DIAGNOSIS — W19XXXS Unspecified fall, sequela: Secondary | ICD-10-CM

## 2014-08-29 DIAGNOSIS — Z7901 Long term (current) use of anticoagulants: Secondary | ICD-10-CM

## 2014-08-29 DIAGNOSIS — M7500 Adhesive capsulitis of unspecified shoulder: Secondary | ICD-10-CM

## 2014-08-29 DIAGNOSIS — H8113 Benign paroxysmal vertigo, bilateral: Secondary | ICD-10-CM | POA: Diagnosis not present

## 2014-08-29 DIAGNOSIS — E118 Type 2 diabetes mellitus with unspecified complications: Secondary | ICD-10-CM | POA: Diagnosis not present

## 2014-08-29 DIAGNOSIS — I951 Orthostatic hypotension: Secondary | ICD-10-CM

## 2014-08-29 LAB — POCT INR: INR: 3.4

## 2014-08-29 MED ORDER — ACETAMINOPHEN-CODEINE #4 300-60 MG PO TABS: 1.0000 | ORAL_TABLET | Freq: Three times a day (TID) | ORAL | Status: DC | PRN

## 2014-08-29 MED ORDER — MECLIZINE HCL 25 MG PO TABS: 25.0000 mg | ORAL_TABLET | Freq: Three times a day (TID) | ORAL | Status: DC | PRN

## 2014-08-29 NOTE — Progress Notes (Signed)
Subjective:    [name] is a 74 y.o. male who presents to [facility] today for several issues:  1.  FU from recent SNF placement:  Patient admitted from clinic due to persistent falls at home back in April.  Found to be multifactorial in nature: poor balance, hypotension, peripheral neuropathy.  DC'ed to SNF for rehab.  Underwent strengthening.  Able to go home last week.  Still receiving home rehab 2-3 x weekly.    2.  Diabetes:  Currently on NPH twice a day at 15 units BID plus Novolog sliding scale.    No adverse effects from medication.  No hypoglycemic events.  No paresthesia or peripheral nerve pain.  Measures blood sugars at home every:     Lab Results  Component Value Date   HGBA1C 7.4* 05/10/2014   3.  Left shoulder pain: Chronic.  No worsening.  Taking Tylenol #4 with relief.  Unable to perform ADLs without analgesics.  No fevers or redness.  Patient's main complaint for today.   Prev health:  Overdue for colonoscopy, which he has consistently refused.  Again today.   ROS as above per HPI, otherwise neg.    The following portions of the patient's history were reviewed and updated as appropriate: allergies, current medications, past medical history, family and social history, and problem list. Patient is a nonsmoker.    PMH reviewed.  Past Medical History  Diagnosis Date  . Depression   . Diabetes mellitus   . COPD (chronic obstructive pulmonary disease)   . Adhesive capsulitis   . MRSA bacteremia     2011 - possible endocarditis, received 6 weeks IV treatment  . Hyperlipidemia   . Anxiety   . GERD (gastroesophageal reflux disease)   . CAD (coronary artery disease)   . 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 CVA (cerebral infarction) Questionable history  . Diverticulosis   . Osteomyelitis 2012    Sternoclavicular joint   . Chronic back pain   . Angina   . Myocardial infarction   . CHF (congestive heart failure)   . Shortness of breath   .  Sleep apnea   . Chronic kidney disease     hx of BPH  . Neuromuscular disorder     HX of diabetic periferal neuropathy  . Hypertension   . Peripheral vascular disease   . On home oxygen therapy     "1.5L prn" (04/12/2014)  . Falls frequently 06/2014   Past Surgical History  Procedure Laterality Date  . Septic arthritis      Removal of infected CABG wire by Dr Arlyce Dice - 2011  . Coronary artery bypass graft  1992  . Fracture surgery  1980s    Hip  . Spine surgery  2002    Cervical fusion vertebroplasty C3-4-5  . Coronary stent placement  1999, 2002  . Doppler echocardiography  Sept 2011    EF 50-55% with some impaired diastolic relaxation  . Colonoscopy  2006     Multiple polyps removed, repeat in 5 years  . Right iliopopliteal bypass - [place]  . Femoral-popliteal bypass graft  2008    Left  . Cardiac catheterization  October 18, 2010    Known obstructive disease, no further blockages  . Cardiac catheterization  03/17/2005    EF 35-40%  . US echocardiography  03/04/2006    EF 50-55%  . Amputation  03/16/2011    Procedure: AMPUTATION DIGIT;  Surgeon: Harrell Gave  Nicole Cella, MD;  Location: [facility] OR;  Service: Vascular;  Laterality: Left;  Great toe  . Hip arthroplasty Left     Medications reviewed. Current Outpatient Prescriptions  Medication Sig Dispense Refill  . acetaminophen (TYLENOL) 325 MG tablet Take 650 mg by mouth every 6 (six) hours as needed for mild pain.    Marland Kitchen acetaminophen (TYLENOL) 650 MG CR tablet Take 650 mg by mouth 2 (two) times daily as needed (for arthritis pain).     Marland Kitchen acetaminophen-codeine (TYLENOL #4) 300-60 MG per tablet Take 1 tablet by mouth every 8 (eight) hours as needed for moderate pain. 45 tablet 2  . bisoprolol (ZEBETA) 5 MG tablet Take 0.5 tablets (2.5 mg total) by mouth daily. 30 tablet 0  . Blood Glucose Monitoring Suppl (ONE TOUCH ULTRA 2) W/DEVICE KIT Please supply patient with 1 glucometer device to use 4 times daily to check blood sugars.  Dx  code: 250.0 1 each 1  . glucose blood test strip Use as instructed.  One Step Ultra 2 strips. 100 each 12  . insulin aspart (NOVOLOG) 100 UNIT/ML injection Use sliding scale as prescribed:  0 - 150 0 units; 151 - 200 2 units, 201 - 250 4 units; 251 - 200 6 units, 301 - 350 8 units (Patient taking differently: Inject 0-10 Units into the skin 3 (three) times daily. Use sliding scale as prescribed:0-150=0 units, 151-200=2 units, 201-250=4 units, 251-300=6 units, 301-350=8 units, 351-400=10 units, if CBG is greater than 401 call MD) 10 mL 11  . insulin NPH Human (HUMULIN N,NOVOLIN N) 100 UNIT/ML injection Inject 0.15 mLs (15 Units total) into the skin 2 (two) times daily before a meal. (Patient taking differently: Inject 15 Units into the skin 2 (two) times daily. Inject 15 units twice daily) 10 mL 11  . Insulin Syringe-Needle U-100 (B-D INS SYRINGE 0.5CC/30GX1/2") 30G X 1/2" 0.5 ML MISC For use with insulin 100 each 0  . lidocaine (LIDODERM) 5 % Place 1 patch onto the skin daily. Remove & Discard patch within 12 hours or as directed by MD (Patient not taking: Reported on 08/24/2014) 30 patch 0  . lisinopril (PRINIVIL,ZESTRIL) 2.5 MG tablet Take 2.5 mg by mouth daily.     Marland Kitchen LORazepam (ATIVAN) 0.5 MG tablet Take 0.5 tablets (0.25 mg total) by mouth at bedtime. (Patient taking differently: Take 0.5 mg by mouth at bedtime. ) 30 tablet 1  . meclizine (ANTIVERT) 25 MG tablet TAKE ONE TABLET BY MOUTH THREE TIMES DAILY AS NEEDED FOR  DIZZINESS (Patient taking differently: Take 25 mg by mouth 3 (three) times daily as needed for dizziness. ) 30 tablet 0  . mirtazapine (REMERON) 15 MG tablet Take 1 tablet (15 mg total) by mouth at bedtime. 30 tablet 6  . NEEDLE, DISP, 30 G (B-D DISP NEEDLE 30GX1") 30G X 1" MISC For using with insulin. 100 each 0  . nitroGLYCERIN (NITROSTAT) 0.4 MG SL tablet Place 1 tablet (0.4 mg total) under the tongue as needed. For chest pains (Patient taking differently: Place 0.4 mg under the tongue  as needed for chest pain. ) 30 tablet 6  . omeprazole (PRILOSEC) 40 MG capsule Take 1 capsule (40 mg total) by mouth daily. 30 capsule 3  . simvastatin (ZOCOR) 80 MG tablet Take 1 tablet (80 mg total) by mouth daily. 30 tablet 6  . tamsulosin (FLOMAX) 0.4 MG CAPS capsule Take 1 capsule (0.4 mg total) by mouth daily after supper. 90 capsule 1  . torsemide (DEMADEX) 20 MG  tablet TAKE ONE TABLET BY MOUTH EVERY DAY 60 tablet 2  . venlafaxine XR (EFFEXOR-XR) 37.5 MG 24 hr capsule TAKE ONE CAPSULE BY MOUTH ONCE DAILY 30 capsule 11  . warfarin (COUMADIN) 5 MG tablet Take 1 tablet (5 mg total) by mouth daily at 6 PM. 90 tablet 3   No current facility-administered medications for this visit.     Objective:   Physical Exam BP 100/43 mmHg  Temp(Src) 97.9 F (36.6 C) (Oral)  Ht '5\' 8"'  (1.727 m)  Wt 255 lb 1.6 oz (115.713 kg)  BMI 38.80 kg/m2 Gen:  Alert, cooperative patient who appears stated age in no acute distress.  Vital signs reviewed.  Sitting in wheelchair.  HEENT: EOMI,  MMM Cardiac:  Regular rate and rhythm  Pulm:  Clear to auscultation bilaterally    Abd:  Obese/soft/NT Exts: +1 edema with chronic venous stasis changes noted BL.  MSK: TTP throughout BL shoulders.  Little ability to participate in ROM testing due to pain.    No results found. However, due to the size of the patient record, not all encounters were searched. Please check Results Review for a complete set of results.

## 2014-08-29 NOTE — Telephone Encounter (Signed)
-----   Message from Domenic Moras, [place] sent at 08/06/2014  7:26 AM EDT ----- Regarding: Order for [place]  Patient Name: [name], [name]([id number]) Sex: Male DOB: 12-10-1940    PCP: [facility] [place]   Types of orders made on 08/06/2014: Consult, Imaging, Lab, Medications, Nursing  Order Date:08/06/2014 Ordering [place] [[id number]] Attending Ray Church, MD [phone] Authorizing Provider: Domenic Moras, PA-C [[id number]] Department:[facility]-EMERGENCY DEPT[[id number]]  Order Specific Information Order: COPD clinic follow up [Custom: [id number]]  Order #: [id number]  Qty: 1   Priority: Routine  Class: Hospital Performed     Where does the patient receive follow up COPD care -> [facility]       Follow up in -> 5-7 days     Released on: 08/06/2014  7:26 AM     Priority: Routine  Class: Hospital Performed     Where does the patient receive follow up COPD care -> [facility]       Follow up in -> 5-7 days     Released on: 08/06/2014  7:26 AM

## 2014-08-29 NOTE — Telephone Encounter (Signed)
Pt called and needs a refill on his Meclizine called in. jw

## 2014-08-29 NOTE — Patient Instructions (Addendum)
I have sent in the Meclizine for you.  Refill for codeine today.  A home health nurse will be contacting you from [facility].  They may be able to help with home health aide.  Stop the Bisoprolol to help with falling as well.   Come back to see me in 3 weeks but sooner if you start falling again.

## 2014-08-30 ENCOUNTER — Telehealth: Payer: Self-pay | Admitting: Family Medicine

## 2014-08-30 NOTE — Telephone Encounter (Signed)
Cindee Salt called to get verbal orders for the patients PT 2 times a week for 6 weeks. He is also reporting that the patient fell 2 times last night. Patient is also requesting a referral to see his ortho doctor. Lastly they would like a prescription sent in to [facility] for a trapeze for the patients hospital bed. jw

## 2014-08-30 NOTE — Telephone Encounter (Signed)
Completed this at visit yesterday.

## 2014-08-30 NOTE — Telephone Encounter (Signed)
Will forward to MD. Jazmin Hartsell,CMA  

## 2014-08-31 NOTE — Assessment & Plan Note (Addendum)
Very difficult to treat.  See prior OV notes.  Multifactorial in nature.   Needs to continue with PT/OT, but not sure how much this will actually help with preventing future falls.   Think he would do well with home health aide.  Will reach out to [place] worker to see if he qualifies for this.

## 2014-08-31 NOTE — Telephone Encounter (Signed)
Please call to give verbal order for PT 2 times weekly for next 6 weeks.  Can the trapeze be a verbal order or does this need to be written?

## 2014-08-31 NOTE — Assessment & Plan Note (Signed)
Holding Bisoprolol today.  Still hypotensive even on low dose 2.5 mg.   May hold indefinitely.  Will recheck at next visit in 2-3 weeks.

## 2014-08-31 NOTE — Assessment & Plan Note (Signed)
Refill for Tylenol 4, which he has been on for years. Risk benefit of pain relief versus slightly increased risk of falling.  Think other medical issues are contributing more to fall than codeine, and his QOL suffers greatly without analgesia.

## 2014-08-31 NOTE — Telephone Encounter (Signed)
Erick called back and would also like to have Education officer, museum added. The trapeze needs to be written and faxed to [phone]. jw

## 2014-08-31 NOTE — Telephone Encounter (Signed)
Will forward this updated message to MD. Johnney Ou

## 2014-08-31 NOTE — Assessment & Plan Note (Signed)
Meclizine refill today. Contributes to falls.

## 2014-08-31 NOTE — Telephone Encounter (Signed)
Called and spoke to pt. Informed pt of the necessity of being seen by a pulmonologist since his recent ED visit. Pt stated his PCP handles his COPD and he does not want to schedule an appt. Advised pt the need for him to be seen by a pulmonologist. Pt continued to refused to schedule an appt. Advised pt if he changes his mind to then call back. Pt verbalized understanding and denied any further questions or concerns at this time.

## 2014-08-31 NOTE — Assessment & Plan Note (Signed)
A1C more or less controlled.   No change to therapy.  Has been in institutional setting.   Real test will be maintaining A1C at acceptable levels with home diet.

## 2014-09-04 ENCOUNTER — Encounter (HOSPITAL_COMMUNITY): Payer: Self-pay | Admitting: Emergency Medicine

## 2014-09-04 ENCOUNTER — Emergency Department (HOSPITAL_COMMUNITY)
Admission: EM | Admit: 2014-09-04 | Discharge: 2014-09-04 | Disposition: A | Discharge status: Home / Self Care | Payer: Medicare Other | Attending: Emergency Medicine | Admitting: Emergency Medicine

## 2014-09-04 ENCOUNTER — Emergency Department (HOSPITAL_COMMUNITY): Payer: Medicare Other

## 2014-09-04 ENCOUNTER — Telehealth: Payer: Self-pay | Admitting: Family Medicine

## 2014-09-04 DIAGNOSIS — Z86711 Personal history of pulmonary embolism: Secondary | ICD-10-CM | POA: Insufficient documentation

## 2014-09-04 DIAGNOSIS — G8929 Other chronic pain: Secondary | ICD-10-CM | POA: Insufficient documentation

## 2014-09-04 DIAGNOSIS — M25551 Pain in right hip: Secondary | ICD-10-CM

## 2014-09-04 DIAGNOSIS — Z9981 Dependence on supplemental oxygen: Secondary | ICD-10-CM | POA: Insufficient documentation

## 2014-09-04 DIAGNOSIS — S79912A Unspecified injury of left hip, initial encounter: Secondary | ICD-10-CM | POA: Insufficient documentation

## 2014-09-04 DIAGNOSIS — Z8614 Personal history of Methicillin resistant Staphylococcus aureus infection: Secondary | ICD-10-CM | POA: Insufficient documentation

## 2014-09-04 DIAGNOSIS — I25119 Atherosclerotic heart disease of native coronary artery with unspecified angina pectoris: Secondary | ICD-10-CM | POA: Diagnosis not present

## 2014-09-04 DIAGNOSIS — F329 Major depressive disorder, single episode, unspecified: Secondary | ICD-10-CM | POA: Diagnosis not present

## 2014-09-04 DIAGNOSIS — E119 Type 2 diabetes mellitus without complications: Secondary | ICD-10-CM | POA: Insufficient documentation

## 2014-09-04 DIAGNOSIS — Z951 Presence of aortocoronary bypass graft: Secondary | ICD-10-CM | POA: Diagnosis not present

## 2014-09-04 DIAGNOSIS — Z8669 Personal history of other diseases of the nervous system and sense organs: Secondary | ICD-10-CM | POA: Diagnosis not present

## 2014-09-04 DIAGNOSIS — Z8673 Personal history of transient ischemic attack (TIA), and cerebral infarction without residual deficits: Secondary | ICD-10-CM | POA: Insufficient documentation

## 2014-09-04 DIAGNOSIS — Z794 Long term (current) use of insulin: Secondary | ICD-10-CM | POA: Insufficient documentation

## 2014-09-04 DIAGNOSIS — Z87891 Personal history of nicotine dependence: Secondary | ICD-10-CM | POA: Insufficient documentation

## 2014-09-04 DIAGNOSIS — Z7901 Long term (current) use of anticoagulants: Secondary | ICD-10-CM | POA: Diagnosis not present

## 2014-09-04 DIAGNOSIS — Z9861 Coronary angioplasty status: Secondary | ICD-10-CM | POA: Diagnosis not present

## 2014-09-04 DIAGNOSIS — K219 Gastro-esophageal reflux disease without esophagitis: Secondary | ICD-10-CM | POA: Diagnosis not present

## 2014-09-04 DIAGNOSIS — Z79899 Other long term (current) drug therapy: Secondary | ICD-10-CM | POA: Insufficient documentation

## 2014-09-04 DIAGNOSIS — I509 Heart failure, unspecified: Secondary | ICD-10-CM | POA: Insufficient documentation

## 2014-09-04 DIAGNOSIS — Z9889 Other specified postprocedural states: Secondary | ICD-10-CM | POA: Diagnosis not present

## 2014-09-04 DIAGNOSIS — Y998 Other external cause status: Secondary | ICD-10-CM | POA: Insufficient documentation

## 2014-09-04 DIAGNOSIS — E785 Hyperlipidemia, unspecified: Secondary | ICD-10-CM | POA: Insufficient documentation

## 2014-09-04 DIAGNOSIS — S79911A Unspecified injury of right hip, initial encounter: Secondary | ICD-10-CM | POA: Insufficient documentation

## 2014-09-04 DIAGNOSIS — Y92002 Bathroom of unspecified non-institutional (private) residence single-family (private) house as the place of occurrence of the external cause: Secondary | ICD-10-CM | POA: Insufficient documentation

## 2014-09-04 DIAGNOSIS — I129 Hypertensive chronic kidney disease with stage 1 through stage 4 chronic kidney disease, or unspecified chronic kidney disease: Secondary | ICD-10-CM | POA: Insufficient documentation

## 2014-09-04 DIAGNOSIS — J449 Chronic obstructive pulmonary disease, unspecified: Secondary | ICD-10-CM | POA: Insufficient documentation

## 2014-09-04 DIAGNOSIS — I252 Old myocardial infarction: Secondary | ICD-10-CM | POA: Diagnosis not present

## 2014-09-04 DIAGNOSIS — N189 Chronic kidney disease, unspecified: Secondary | ICD-10-CM | POA: Insufficient documentation

## 2014-09-04 DIAGNOSIS — W1839XA Other fall on same level, initial encounter: Secondary | ICD-10-CM | POA: Insufficient documentation

## 2014-09-04 DIAGNOSIS — M25552 Pain in left hip: Secondary | ICD-10-CM

## 2014-09-04 DIAGNOSIS — R296 Repeated falls: Secondary | ICD-10-CM

## 2014-09-04 DIAGNOSIS — Y9389 Activity, other specified: Secondary | ICD-10-CM | POA: Diagnosis not present

## 2014-09-04 DIAGNOSIS — W19XXXA Unspecified fall, initial encounter: Secondary | ICD-10-CM

## 2014-09-04 MED ORDER — FENTANYL CITRATE (PF) 100 MCG/2ML IJ SOLN
50.0000 ug | Freq: Once | INTRAMUSCULAR | Status: AC
  Administered 2014-09-04: 50 ug via INTRAVENOUS
  Filled 2014-09-04: qty 2

## 2014-09-04 MED ORDER — SODIUM CHLORIDE 0.9 % IV BOLUS (SEPSIS)
500.0000 mL | Freq: Once | INTRAVENOUS | Status: AC
  Administered 2014-09-04: 500 mL via INTRAVENOUS

## 2014-09-04 NOTE — ED Notes (Signed)
Pt's wife getting a ride with daughter-- pt going home with PTAR-- moved to rm C24 to wait for [facility].  Pt and family spoke with case manager regarding Medicaid and SNF -- see case manager's note. Report given to Janett Billow, RN-- pt's family received discharge instructions.

## 2014-09-04 NOTE — Consult Note (Signed)
Family Medicine Teaching Service ED Consult Note Service Pager: [phone]  Patient name: [name] Medical record number: [id number] Date of birth: 1940-12-03 Age: 74 y.o. Gender: male  Primary Care Provider: [name], MD Requesting Provider: ED - Addison Lank, MD  Chief Complaint: recurrent falls, generalized weakness  Assessment and Plan: [name] is a 74 y.o. male presenting with recurrent falls and generalized weakness, gradual worsening over past 6 months. PMH is significant for known chronic deconditioning, DM2 complicated with peripheral neuropathy, orthostatic hypotension, poor balance, chronic pain syndrome (back pain with arthritis), h/o CAD, GERD, PE (on chronic anticoagulation), COPD, HTN, HLD, depression.  # Recurrent falls, chronic Multifactorial chronic problem in setting of generalized deconditioning and weakness, peripheral neuropathy (complication of DM2), orthostatic hypotension (likely component of autonomic dysfunction), poor balance, chronic pain limiting activity, these problems have been chronically worsening over past >6 months. Significant recent course with hospitalization April 2016 with dehydration and frequent falls, discharged to SNF-short term rehab with minimal improvement on str training, discharged home with [facility] PT regularly (2-3x weekly) but overall without much improvement. Now continues to have falls, family difficult to provide care at home. - Fall last night without head injury, no focal neuro deficits. X-rays done in ED negative for hip fracture - BPs improved in ED after initial 500cc bolus - Emphasized importance of using assistance devices, including bedside commode, pt is fall risk at anytime he is ambulating - Agree with limiting medications that increase fall risk. Caution with diuretics, Torsemide would only use PRN - Can continue Meclizine if helping - Patient and wife agreeable to pursuing long-term SNF placement for his safety and  continued care - Discussed case with CM / CSW regarding eligibility and plan to pursue long-term SNF. Will need medicaid application and increased [place] assistance temporarily until arrangements made. - Recommend discharge to home with close follow-up - Patient was evaluated in ED with attending Dr. Andria Frames  Disposition: Discharge to home with Home Health  History of Present Illness: [name] is a 74 y.o. male presenting with fall last night with chronic history of recurrent falls and gradual worsening deconditioning.  Recently discharged from hospital in April 2016, went to [facility] for rehab, said he had difficulties even during rehab, discharged home and has since not had significant improvement overall.  Currently mostly limited to bed for extended periods of time, able to walk several feet to bathroom and often does not use bedside commode. Fall episode last night in bathroom, no LOC, did not hit head. Difficulty getting up, required family to call assistance, ultimately called EMS.  Lives at home with wife and daughter-in-law, provide regular support but are not always available for 24 hr support/assistance. Currently getting [facility] therapy and RN.  Denies any fevers/chills, chest pain / tightness, dyspnea, abd pain, n/v, constipation, diarrhea, syncope, palpitations, focal weakness  Review Of Systems: Per HPI with the following additions: none Otherwise 12 point review of systems was performed and was unremarkable.  Patient Active Problem List   Diagnosis Date Noted  . Bilateral edema of lower extremity   . Dehydration 07/18/2014  . Diarrhea 07/18/2014  . Benign paroxysmal positional vertigo 05/26/2014  . OA (osteoarthritis) 05/12/2014  . DM type 2, controlled, with complication 56/38/7564  . Unsteady gait 04/12/2014  . Orthostatic hypotension 05/16/2013  . Generalized weakness 04/30/2013  . At high risk for falls 07/19/2012  . Falls 02/17/2012  . Bradycardia 01/22/2012   . Hemorrhoid 01/12/2012  . GERD (gastroesophageal  reflux disease) 11/10/2011  . Leg edema 09/20/2011  . Peripheral vascular disease 04/16/2011  . COPD (chronic obstructive pulmonary disease) 02/27/2011  . Stasis dermatitis 12/04/2010  . BPH (benign prostatic hyperplasia) 11/01/2010  . Long term current use of anticoagulant therapy 05/03/2010  . FROZEN RIGHT SHOULDER 02/25/2010  . DIABETIC PERIPHERAL NEUROPATHY 12/20/2009  . Depression 07/08/2006  . Essential hypertension 07/08/2006  . HYPERCHOLESTEROLEMIA 05/21/2006  . Anxiety state 05/21/2006  . CORONARY, ARTERIOSCLEROSIS 05/21/2006  . Chronic systolic heart failure 42/35/3614  . REFLUX ESOPHAGITIS 05/21/2006  . APNEA, SLEEP 05/21/2006   Past Medical History: Past Medical History  Diagnosis Date  . Depression   . Diabetes mellitus   . COPD (chronic obstructive pulmonary disease)   . Adhesive capsulitis   . MRSA bacteremia     2011 - possible endocarditis, received 6 weeks IV treatment  . Hyperlipidemia   . Anxiety   . GERD (gastroesophageal reflux disease)   . CAD (coronary artery disease)   . 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 CVA (cerebral infarction) Questionable history  . Diverticulosis   . Osteomyelitis 2012    Sternoclavicular joint   . Chronic back pain   . Angina   . Myocardial infarction   . CHF (congestive heart failure)   . Shortness of breath   . Sleep apnea   . Chronic kidney disease     hx of BPH  . Neuromuscular disorder     HX of diabetic periferal neuropathy  . Hypertension   . Peripheral vascular disease   . On home oxygen therapy     "1.5L prn" (04/12/2014)  . Falls frequently 06/2014   Past Surgical History: Past Surgical History  Procedure Laterality Date  . Septic arthritis      Removal of infected CABG wire by Dr Arlyce Dice - 2011  . Coronary artery bypass graft  1992  . Fracture surgery  1980s    Hip  . Spine surgery  2002    Cervical fusion  vertebroplasty C3-4-5  . Coronary stent placement  1999, 2002  . Doppler echocardiography  Sept 2011    EF 50-55% with some impaired diastolic relaxation  . Colonoscopy  2006     Multiple polyps removed, repeat in 5 years  . Right iliopopliteal bypass - [place]  . Femoral-popliteal bypass graft  2008    Left  . Cardiac catheterization  October 18, 2010    Known obstructive disease, no further blockages  . Cardiac catheterization  03/17/2005    EF 35-40%  . US echocardiography  03/04/2006    EF 50-55%  . Amputation  03/16/2011    Procedure: AMPUTATION DIGIT;  Surgeon: Angelia Mould, MD;  Location: [facility] OR;  Service: Vascular;  Laterality: Left;  Great toe  . Hip arthroplasty Left    Social History: History  Substance Use Topics  . Smoking status: Former Smoker    Quit date: 06/19/2000  . Smokeless tobacco: Never Used  . Alcohol Use: No   Additional social history: none  Please also refer to relevant sections of EMR.  Family History: Family History  Problem Relation Age of Onset  . Heart disease Father   . Heart disease Mother    Allergies and Medications: Allergies  Allergen Reactions  . Vancomycin Other (See Comments)    "red man syndrome"  . Diazepam Anxiety    REACTION: makes patient cry   No current facility-administered medications on file prior to encounter.  Current Outpatient Prescriptions on File Prior to Encounter  Medication Sig Dispense Refill  . acetaminophen-codeine (TYLENOL #4) 300-60 MG per tablet Take 1-2 tablets by mouth every 8 (eight) hours as needed for moderate pain. 60 tablet 2  . bisoprolol (ZEBETA) 5 MG tablet Take 0.5 tablets (2.5 mg total) by mouth daily. 30 tablet 0  . Blood Glucose Monitoring Suppl (ONE TOUCH ULTRA 2) W/DEVICE KIT Please supply patient with 1 glucometer device to use 4 times daily to check blood sugars.  Dx code: 250.0 1 each 1  . glucose blood test strip Use as instructed.  One Step Ultra 2 strips. 100 each 12  .  insulin aspart (NOVOLOG) 100 UNIT/ML injection Use sliding scale as prescribed:  0 - 150 0 units; 151 - 200 2 units, 201 - 250 4 units; 251 - 200 6 units, 301 - 350 8 units (Patient taking differently: Inject 0-10 Units into the skin 3 (three) times daily. Use sliding scale as prescribed:0-150=0 units, 151-200=2 units, 201-250=4 units, 251-300=6 units, 301-350=8 units, 351-400=10 units, if CBG is greater than 401 call MD) 10 mL 11  . insulin NPH Human (HUMULIN N,NOVOLIN N) 100 UNIT/ML injection Inject 0.15 mLs (15 Units total) into the skin 2 (two) times daily before a meal. 10 mL 11  . Insulin Syringe-Needle U-100 (B-D INS SYRINGE 0.5CC/30GX1/2") 30G X 1/2" 0.5 ML MISC For use with insulin 100 each 0  . lisinopril (PRINIVIL,ZESTRIL) 2.5 MG tablet Take 2.5 mg by mouth daily.     Marland Kitchen LORazepam (ATIVAN) 0.5 MG tablet Take 0.5 tablets (0.25 mg total) by mouth at bedtime. 30 tablet 1  . meclizine (ANTIVERT) 25 MG tablet Take 1 tablet (25 mg total) by mouth 3 (three) times daily as needed for dizziness. 45 tablet 1  . mirtazapine (REMERON) 15 MG tablet Take 1 tablet (15 mg total) by mouth at bedtime. 30 tablet 6  . NEEDLE, DISP, 30 G (B-D DISP NEEDLE 30GX1") 30G X 1" MISC For using with insulin. 100 each 0  . nitroGLYCERIN (NITROSTAT) 0.4 MG SL tablet Place 1 tablet (0.4 mg total) under the tongue as needed. For chest pains (Patient taking differently: Place 0.4 mg under the tongue as needed for chest pain. ) 30 tablet 6  . omeprazole (PRILOSEC) 40 MG capsule Take 1 capsule (40 mg total) by mouth daily. 30 capsule 3  . simvastatin (ZOCOR) 80 MG tablet Take 1 tablet (80 mg total) by mouth daily. (Patient taking differently: Take 80 mg by mouth daily at 6 PM. ) 30 tablet 6  . tamsulosin (FLOMAX) 0.4 MG CAPS capsule Take 1 capsule (0.4 mg total) by mouth daily after supper. 90 capsule 1  . torsemide (DEMADEX) 20 MG tablet TAKE ONE TABLET BY MOUTH EVERY DAY 60 tablet 2  . venlafaxine XR (EFFEXOR-XR) 37.5 MG 24 hr  capsule TAKE ONE CAPSULE BY MOUTH ONCE DAILY 30 capsule 11  . warfarin (COUMADIN) 5 MG tablet Take 1 tablet (5 mg total) by mouth daily at 6 PM. 90 tablet 3  . lidocaine (LIDODERM) 5 % Place 1 patch onto the skin daily. Remove & Discard patch within 12 hours or as directed by MD (Patient not taking: Reported on 08/24/2014) 30 patch 0    Objective: BP 104/48 mmHg  Pulse 112  Temp(Src) 98.5 F (36.9 C) (Oral)  Resp 15  SpO2 90% Exam: General: chronically ill-appearing, obese, some discomfort due to pain, cooperative, NAD HEENT: NCAT, PERRL, EOMI, oropharynx clear, mild dry MM Neck: supple, no  JVD Heart: Tachycardic, regular rhythm Lungs - CTAB, no wheezing, crackles, or rhonchi. Normal work of breathing. Not on O2 Abd - obese, soft, NTND, no masses, +active BS MSK - No obvious deformities b/l shoulders / hips or leg length. Generalized tenderness to Right shoulder anterior and Left hip / lower ext generally, chronic limited ROM but intact strength Ext - Stable chronic trace edema, peripheral pulses intact  Skin - warm, dry, no rashes Neuro - awake, alert, oriented, grossly non-focal, intact muscle strength 5/5 bilateral ext. Did not test ambulation given limited ambulation at baseline.   Labs and Imaging: CBC BMET  No results for input(s): WBC, HGB, HCT, PLT in the last 168 hours. No results for input(s): NA, K, CL, CO2, BUN, CREATININE, GLUCOSE, CALCIUM in the last 168 hours.   Left Hip / Femur X-ray IMPRESSION: Left hip arthroplasty with mild acetabular protrusio.  No fracture or dislocation is seen.  No interval change.   Olin Hauser, DO 09/04/2014, 5:06 PM PGY-2, [place] Intern pager: [phone], text pages welcome

## 2014-09-04 NOTE — ED Notes (Signed)
Pt to ED via [facility] from home, with c/o fall last night in bathroom, had to be picked up by son - in - law, was able to walk 5-6 steps to bed. Was unable to get out of bed today. Pt was in ED at [facility] last week for same thing.

## 2014-09-04 NOTE — ED Provider Notes (Signed)
CSN: [id number]     Arrival date & time 09/04/14  1133 History   First MD Initiated Contact with Patient 09/04/14 1134     Chief Complaint  Patient presents with  . Fall     (Consider location/radiation/quality/duration/timing/severity/associated sxs/prior Treatment) Patient is a 74 y.o. male presenting with fall.  Fall This is a recurrent problem. The current episode started yesterday. The problem occurs daily. The problem has been gradually worsening. Associated symptoms include fatigue. Pertinent negatives include no abdominal pain, chest pain, chills, congestion, coughing, diaphoresis, fever, headaches, nausea, neck pain, numbness, rash, sore throat, visual change, vomiting or weakness. Nothing aggravates the symptoms. He has tried nothing for the symptoms.    Past Medical History  Diagnosis Date  . Depression   . Diabetes mellitus   . COPD (chronic obstructive pulmonary disease)   . Adhesive capsulitis   . MRSA bacteremia     2011 - possible endocarditis, received 6 weeks IV treatment  . Hyperlipidemia   . Anxiety   . GERD (gastroesophageal reflux disease)   . CAD (coronary artery disease)   . 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 CVA (cerebral infarction) Questionable history  . Diverticulosis   . Osteomyelitis 2012    Sternoclavicular joint   . Chronic back pain   . Angina   . Myocardial infarction   . CHF (congestive heart failure)   . Shortness of breath   . Sleep apnea   . Chronic kidney disease     hx of BPH  . Neuromuscular disorder     HX of diabetic periferal neuropathy  . Hypertension   . Peripheral vascular disease   . On home oxygen therapy     "1.5L prn" (04/12/2014)  . Falls frequently 06/2014   Past Surgical History  Procedure Laterality Date  . Septic arthritis      Removal of infected CABG wire by Dr Arlyce Dice - 2011  . Coronary artery bypass graft  1992  . Fracture surgery  1980s    Hip  . Spine surgery  2002   Cervical fusion vertebroplasty C3-4-5  . Coronary stent placement  1999, 2002  . Doppler echocardiography  Sept 2011    EF 50-55% with some impaired diastolic relaxation  . Colonoscopy  2006     Multiple polyps removed, repeat in 5 years  . Right iliopopliteal bypass - [place]  . Femoral-popliteal bypass graft  2008    Left  . Cardiac catheterization  October 18, 2010    Known obstructive disease, no further blockages  . Cardiac catheterization  03/17/2005    EF 35-40%  . US echocardiography  03/04/2006    EF 50-55%  . Amputation  03/16/2011    Procedure: AMPUTATION DIGIT;  Surgeon: Angelia Mould, MD;  Location: [facility] OR;  Service: Vascular;  Laterality: Left;  Great toe  . Hip arthroplasty Left    Family History  Problem Relation Age of Onset  . Heart disease Father   . Heart disease Mother    History  Substance Use Topics  . Smoking status: Former Smoker    Quit date: 06/19/2000  . Smokeless tobacco: Never Used  . Alcohol Use: No    Review of Systems  Constitutional: Positive for fatigue. Negative for fever, chills, diaphoresis and appetite change.  HENT: Negative for congestion, ear pain, facial swelling, mouth sores and sore throat.   Eyes: Negative for visual disturbance.  Respiratory: Negative for cough, chest tightness  and shortness of breath.   Cardiovascular: Negative for chest pain and palpitations.  Gastrointestinal: Negative for nausea, vomiting, abdominal pain, diarrhea and blood in stool.  Endocrine: Negative for cold intolerance and heat intolerance.  Genitourinary: Negative for frequency, decreased urine volume and difficulty urinating.  Musculoskeletal: Positive for gait problem. Negative for back pain, neck pain and neck stiffness.  Skin: Negative for rash.  Neurological: Negative for dizziness, weakness, light-headedness, numbness and headaches.  All other systems reviewed and are negative.     Allergies  Vancomycin and Diazepam  Home  Medications   Prior to Admission medications   Medication Sig Start Date End Date Taking? Authorizing Provider  acetaminophen (TYLENOL) 650 MG CR tablet Take 650 mg by mouth every 8 (eight) hours as needed for pain.   Yes Historical Provider, MD  acetaminophen-codeine (TYLENOL #4) 300-60 MG per tablet Take 1-2 tablets by mouth every 8 (eight) hours as needed for moderate pain. 08/29/14  Yes Alveda Reasons, MD  bisoprolol (ZEBETA) 5 MG tablet Take 0.5 tablets (2.5 mg total) by mouth daily. 05/10/14  Yes Alveda Reasons, MD  Blood Glucose Monitoring Suppl (ONE TOUCH ULTRA 2) W/DEVICE KIT Please supply patient with 1 glucometer device to use 4 times daily to check blood sugars.  Dx code: 250.0 10/19/13  Yes Alveda Reasons, MD  glucose blood test strip Use as instructed.  One Step Ultra 2 strips. 08/23/14  Yes Alveda Reasons, MD  insulin aspart (NOVOLOG) 100 UNIT/ML injection Use sliding scale as prescribed:  0 - 150 0 units; 151 - 200 2 units, 201 - 250 4 units; 251 - 200 6 units, 301 - 350 8 units Patient taking differently: Inject 0-10 Units into the skin 3 (three) times daily. Use sliding scale as prescribed:0-150=0 units, 151-200=2 units, 201-250=4 units, 251-300=6 units, 301-350=8 units, 351-400=10 units, if CBG is greater than 401 call MD 06/06/14  Yes Alveda Reasons, MD  insulin NPH Human (HUMULIN N,NOVOLIN N) 100 UNIT/ML injection Inject 0.15 mLs (15 Units total) into the skin 2 (two) times daily before a meal. 06/06/14  Yes Alveda Reasons, MD  Insulin Syringe-Needle U-100 (B-D INS SYRINGE 0.5CC/30GX1/2") 30G X 1/2" 0.5 ML MISC For use with insulin 01/20/14  Yes Alveda Reasons, MD  lisinopril (PRINIVIL,ZESTRIL) 2.5 MG tablet Take 2.5 mg by mouth daily.  06/06/14  Yes Historical Provider, MD  LORazepam (ATIVAN) 0.5 MG tablet Take 0.5 tablets (0.25 mg total) by mouth at bedtime. 07/06/14  Yes Alveda Reasons, MD  meclizine (ANTIVERT) 25 MG tablet Take 1 tablet (25 mg total) by mouth 3 (three)  times daily as needed for dizziness. 08/29/14  Yes Alveda Reasons, MD  mirtazapine (REMERON) 15 MG tablet Take 1 tablet (15 mg total) by mouth at bedtime. 07/10/14  Yes Alveda Reasons, MD  NEEDLE, DISP, 30 G (B-D DISP NEEDLE 30GX1") 30G X 1" MISC For using with insulin. 01/20/14  Yes Alveda Reasons, MD  nitroGLYCERIN (NITROSTAT) 0.4 MG SL tablet Place 1 tablet (0.4 mg total) under the tongue as needed. For chest pains Patient taking differently: Place 0.4 mg under the tongue as needed for chest pain.  10/19/13  Yes Alveda Reasons, MD  omeprazole (PRILOSEC) 40 MG capsule Take 1 capsule (40 mg total) by mouth daily. 05/10/14  Yes Alveda Reasons, MD  simvastatin (ZOCOR) 80 MG tablet Take 1 tablet (80 mg total) by mouth daily. Patient taking differently: Take 80 mg by mouth daily at 6  PM.  06/06/14  Yes Alveda Reasons, MD  tamsulosin (FLOMAX) 0.4 MG CAPS capsule Take 1 capsule (0.4 mg total) by mouth daily after supper. 10/19/13  Yes Alveda Reasons, MD  torsemide (DEMADEX) 20 MG tablet TAKE ONE TABLET BY MOUTH EVERY DAY 07/10/14  Yes Alveda Reasons, MD  venlafaxine XR (EFFEXOR-XR) 37.5 MG 24 hr capsule TAKE ONE CAPSULE BY MOUTH ONCE DAILY 05/05/14  Yes Alveda Reasons, MD  warfarin (COUMADIN) 5 MG tablet Take 1 tablet (5 mg total) by mouth daily at 6 PM. 06/28/14  Yes Alveda Reasons, MD  lidocaine (LIDODERM) 5 % Place 1 patch onto the skin daily. Remove & Discard patch within 12 hours or as directed by MD Patient not taking: Reported on 08/24/2014 05/05/14   Alveda Reasons, MD   BP 104/52 mmHg  Pulse 113  Temp(Src) 98.5 F (36.9 C) (Oral)  Resp 20  SpO2 90% Physical Exam  Constitutional: He is oriented to person, place, and time. He appears well-nourished. No distress.  HENT:  Head: Normocephalic and atraumatic.  Right Ear: External ear normal.  Left Ear: External ear normal.  Eyes: Pupils are equal, round, and reactive to light. Right eye exhibits no discharge. Left eye exhibits no  discharge. No scleral icterus.  Neck: Normal range of motion. Neck supple.  Cardiovascular: Normal rate.  Exam reveals no gallop and no friction rub.   No murmur heard. Pulmonary/Chest: Effort normal and breath sounds normal. No stridor. No respiratory distress. He has no wheezes. He has no rales. He exhibits no tenderness.  Abdominal: Soft. He exhibits no distension and no mass. There is no tenderness. There is no rebound and no guarding.  Musculoskeletal: He exhibits no edema.       Right hip: He exhibits tenderness.       Left hip: He exhibits tenderness.       Right upper leg: He exhibits tenderness.       Left upper leg: He exhibits tenderness.  BLE edema  Neurological: He is alert and oriented to person, place, and time. He has normal strength. No cranial nerve deficit or sensory deficit. GCS eye subscore is 4. GCS verbal subscore is 5. GCS motor subscore is 6.  Skin: Skin is warm and dry. No rash noted. He is not diaphoretic. No erythema.    ED Course  Procedures (including critical care time) Labs Review Labs Reviewed - No data to display  Imaging Review Dg Hip Unilat With Pelvis 2-3 Views Left  09/04/2014   CLINICAL DATA:  Fall, upper leg pain  EXAM: LEFT HIP (WITH PELVIS) 2-3 VIEWS  COMPARISON:  08/24/2014  FINDINGS: Left hip arthroplasty.  Cerclage wire fixation of the femoral stem.  Mild acetabular protrusio.  Associated metallic fragments/shrapnel overlying the joint space.  No evidence of hardware fracture or loosening.  No fracture or dislocation is seen.  Overall appearance is unchanged.  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  No fracture or dislocation is seen.  No interval change.   Electronically Signed   By: Julian Hy M.D.   On: 09/04/2014 13:20   Dg Femur Min 2 Views Left  09/04/2014   CLINICAL DATA:  Status post fall 09/03/2014. Left upper leg pain. Initial encounter.  EXAM: LEFT FEMUR 2 VIEWS  COMPARISON:  Plain films of the right  hip this same day.  FINDINGS: No acute bony or joint abnormality is identified. There is partial visualization of the femoral stem of the patient's  left hip replacement.  IMPRESSION: No acute abnormality.   Electronically Signed   By: Inge Rise M.D.   On: 09/04/2014 14:01   Dg Femur, Min 2 Views Right  09/04/2014   CLINICAL DATA:  Fall, upper leg pain  EXAM: RIGHT FEMUR 2 VIEWS  COMPARISON:  None.  FINDINGS: No fracture or dislocation is seen.  Hip joint space is preserved.  Degenerative changes of the knee.  Surgical clips related to saphenous vein harvest.  IMPRESSION: No fracture or dislocation is seen.   Electronically Signed   By: Julian Hy M.D.   On: 09/04/2014 13:17     EKG Interpretation None      MDM   74 year old gentleman with a past medical history listed above presents after a mechanical fall last night. Patient has been seen here multiple times for frequent falls and has close follow-up with PCP trying to arrange for additional home health care. Patient complains of bilateral hip pain following the fall. He reports that he was unable to get up today because of the pain. Patient spends most of the time in bed and gets up only to have a bowel movement. He denies any head trauma during or after the fall last night. No focal deficits. Rest of the history of present illness and exam as above. Plain films obtained to assess for acute injury; these were negative. Patient was noted to have low blood pressure with systolics in the high 31D that responded to 500 cc bolus. Patient has been hemodynamically stable since. Family medicine was consulted for evaluation given the patient's recurrent frequent falls and inability to perform ADLs at home. They have evaluated the patient and did not see reason for inpatient admission however would like social work to evaluate the patient and try to arrange for home health.   Caseworker has [place] the patient and contacted the Education officer, museum who  will arrange for close follow-up at home and assistance applying for long-term Medicaid. Patient clinically safe for discharge. Patient discharged in good condition with strict return precautions. Patient to follow-up with PCP and as above for continued assessment for long-term placement.  Patient seen in conjunction with Dr. Alvino Chapel.  Sibyl Parr, M.D. Resident  Final diagnoses:  Fall  Hip pain, bilateral  Frequent falls        Addison Lank, MD 09/04/14 Lane, MD 09/07/14 [phone]

## 2014-09-04 NOTE — Care Management (Signed)
ED CM met with patient and wife at bedside concerning recommendations for possible SNF vs. [facility] [facility] service, after reviewing patient's record. ED CM  Patient has had 4 ED visits had 3 hospitalizations in the past 6 months. Discussed care goals with patient and wife, wife states, she is not capable of caring for him at home due to her health issues, the are seeking long term care. Patient has fallen as per wife twice in 2 days.  CM explained that on completion of ED evaluation patient does not meet criteria for admission, HH was offered wife states, that they are receiving [facility] services weekly with [facility]. Explained that we would send referral  for increase of HH supportive services, patient and wife agreeable. Referral faxed to [facility] [phone] RN, PT, SW. No DME needs identified. Updated  Dr.P  Cardena and K. Cobb RN  on discharged plan they are agreeable with plan. No further ED CM needs.

## 2014-09-04 NOTE — Discharge Instructions (Signed)

## 2014-09-04 NOTE — Telephone Encounter (Signed)
Ms. Olean Ree, a nurse with [facility] is calling to inform PCP that the patient has fallen and is currently being transported to [facility] hospital. Thank you, Fonda Kinder, ASA

## 2014-09-05 NOTE — Telephone Encounter (Signed)
I am out of town and will complete this tomorrow.

## 2014-09-08 ENCOUNTER — Telehealth: Payer: Self-pay | Admitting: Family Medicine

## 2014-09-08 NOTE — Telephone Encounter (Signed)
Assisting pt with nursing home placement, family can no longer manage his care at home, need verbal approval to go back out to do another visit with the family, will be dropping off fl2 form to be signed asap

## 2014-09-10 NOTE — Telephone Encounter (Signed)
I'm out this week.  Dr. Ree Kida will be covering my box for every day except Monday.  If it comes in Monday please ask a preceptor to sign it if they don't mind, otherwise ask Dr. Ree Kida.  Thanks

## 2014-09-11 ENCOUNTER — Telehealth: Payer: Self-pay | Admitting: Family Medicine

## 2014-09-11 NOTE — Telephone Encounter (Signed)
Note to nursing staff - please contact the SW with [facility] to let them know that Dr. Mingo Amber is out of the office this week and will complete the form when he returns next week, thanks.

## 2014-09-11 NOTE — Telephone Encounter (Signed)
Patient's social worker through [employer] is dropping off a form to be completed for the patient's nursing home placement. Please contact the social worker, [name], at [phone] once it is completed and ready for pick up. Thank you, Fonda Kinder, ASA

## 2014-09-12 ENCOUNTER — Ambulatory Visit: Payer: Medicare Other

## 2014-09-12 NOTE — Telephone Encounter (Signed)
Spoke with [name] and she voiced understanding on needing to wait for pcp to complete form.  She would like to get the ok to go ahead and go back to patient's home to check in with family. Concepcion Kirkpatrick,CMA

## 2014-09-12 NOTE — Telephone Encounter (Signed)
[name] is aware of this. Mussa Groesbeck,CMA

## 2014-09-12 NOTE — Telephone Encounter (Signed)
Nursing staff - please call [name] and give her the ok to return to the patient's home, thanks.

## 2014-09-13 ENCOUNTER — Encounter (HOSPITAL_COMMUNITY): Payer: Self-pay | Admitting: *Deleted

## 2014-09-13 ENCOUNTER — Emergency Department (HOSPITAL_COMMUNITY): Payer: Medicare Other

## 2014-09-13 ENCOUNTER — Telehealth: Payer: Self-pay | Admitting: Family Medicine

## 2014-09-13 ENCOUNTER — Emergency Department (HOSPITAL_COMMUNITY)
Admission: EM | Admit: 2014-09-13 | Discharge: 2014-09-13 | Disposition: A | Discharge status: Home / Self Care | Payer: Medicare Other | Attending: Emergency Medicine | Admitting: Emergency Medicine

## 2014-09-13 DIAGNOSIS — Z8673 Personal history of transient ischemic attack (TIA), and cerebral infarction without residual deficits: Secondary | ICD-10-CM | POA: Insufficient documentation

## 2014-09-13 DIAGNOSIS — Z8669 Personal history of other diseases of the nervous system and sense organs: Secondary | ICD-10-CM | POA: Diagnosis not present

## 2014-09-13 DIAGNOSIS — J449 Chronic obstructive pulmonary disease, unspecified: Secondary | ICD-10-CM | POA: Insufficient documentation

## 2014-09-13 DIAGNOSIS — I25119 Atherosclerotic heart disease of native coronary artery with unspecified angina pectoris: Secondary | ICD-10-CM | POA: Diagnosis not present

## 2014-09-13 DIAGNOSIS — Z87891 Personal history of nicotine dependence: Secondary | ICD-10-CM | POA: Diagnosis not present

## 2014-09-13 DIAGNOSIS — Y998 Other external cause status: Secondary | ICD-10-CM | POA: Diagnosis not present

## 2014-09-13 DIAGNOSIS — Z9861 Coronary angioplasty status: Secondary | ICD-10-CM | POA: Diagnosis not present

## 2014-09-13 DIAGNOSIS — Y9389 Activity, other specified: Secondary | ICD-10-CM | POA: Diagnosis not present

## 2014-09-13 DIAGNOSIS — Z86711 Personal history of pulmonary embolism: Secondary | ICD-10-CM | POA: Insufficient documentation

## 2014-09-13 DIAGNOSIS — Z8614 Personal history of Methicillin resistant Staphylococcus aureus infection: Secondary | ICD-10-CM | POA: Diagnosis not present

## 2014-09-13 DIAGNOSIS — I509 Heart failure, unspecified: Secondary | ICD-10-CM | POA: Diagnosis not present

## 2014-09-13 DIAGNOSIS — E1140 Type 2 diabetes mellitus with diabetic neuropathy, unspecified: Secondary | ICD-10-CM | POA: Diagnosis not present

## 2014-09-13 DIAGNOSIS — M19011 Primary osteoarthritis, right shoulder: Secondary | ICD-10-CM | POA: Insufficient documentation

## 2014-09-13 DIAGNOSIS — N189 Chronic kidney disease, unspecified: Secondary | ICD-10-CM | POA: Insufficient documentation

## 2014-09-13 DIAGNOSIS — I252 Old myocardial infarction: Secondary | ICD-10-CM | POA: Diagnosis not present

## 2014-09-13 DIAGNOSIS — Z951 Presence of aortocoronary bypass graft: Secondary | ICD-10-CM | POA: Diagnosis not present

## 2014-09-13 DIAGNOSIS — Z9889 Other specified postprocedural states: Secondary | ICD-10-CM | POA: Insufficient documentation

## 2014-09-13 DIAGNOSIS — Y92009 Unspecified place in unspecified non-institutional (private) residence as the place of occurrence of the external cause: Secondary | ICD-10-CM | POA: Insufficient documentation

## 2014-09-13 DIAGNOSIS — S79912A Unspecified injury of left hip, initial encounter: Secondary | ICD-10-CM | POA: Diagnosis not present

## 2014-09-13 DIAGNOSIS — W1839XA Other fall on same level, initial encounter: Secondary | ICD-10-CM | POA: Diagnosis not present

## 2014-09-13 DIAGNOSIS — E785 Hyperlipidemia, unspecified: Secondary | ICD-10-CM | POA: Diagnosis not present

## 2014-09-13 DIAGNOSIS — I129 Hypertensive chronic kidney disease with stage 1 through stage 4 chronic kidney disease, or unspecified chronic kidney disease: Secondary | ICD-10-CM | POA: Diagnosis not present

## 2014-09-13 DIAGNOSIS — Z7901 Long term (current) use of anticoagulants: Secondary | ICD-10-CM | POA: Diagnosis not present

## 2014-09-13 DIAGNOSIS — K219 Gastro-esophageal reflux disease without esophagitis: Secondary | ICD-10-CM | POA: Diagnosis not present

## 2014-09-13 DIAGNOSIS — F419 Anxiety disorder, unspecified: Secondary | ICD-10-CM | POA: Insufficient documentation

## 2014-09-13 DIAGNOSIS — Z794 Long term (current) use of insulin: Secondary | ICD-10-CM | POA: Insufficient documentation

## 2014-09-13 DIAGNOSIS — G8929 Other chronic pain: Secondary | ICD-10-CM | POA: Diagnosis not present

## 2014-09-13 DIAGNOSIS — Z79899 Other long term (current) drug therapy: Secondary | ICD-10-CM | POA: Diagnosis not present

## 2014-09-13 DIAGNOSIS — W19XXXA Unspecified fall, initial encounter: Secondary | ICD-10-CM

## 2014-09-13 DIAGNOSIS — Z9981 Dependence on supplemental oxygen: Secondary | ICD-10-CM | POA: Diagnosis not present

## 2014-09-13 DIAGNOSIS — M25552 Pain in left hip: Secondary | ICD-10-CM

## 2014-09-13 LAB — CBC
HCT: 41.7 % (ref 39.0–52.0)
Hemoglobin: 13.3 g/dL (ref 13.0–17.0)
MCH: 26.1 pg (ref 26.0–34.0)
MCHC: 31.9 g/dL (ref 30.0–36.0)
MCV: 81.8 fL (ref 78.0–100.0)
PLATELETS: 188 10*3/uL (ref 150–400)
RBC: 5.1 MIL/uL (ref 4.22–5.81)
RDW: 16.2 % — AB (ref 11.5–15.5)
WBC: 10.4 10*3/uL (ref 4.0–10.5)

## 2014-09-13 LAB — BASIC METABOLIC PANEL
Anion gap: 8 (ref 5–15)
BUN: 23 mg/dL — ABNORMAL HIGH (ref 6–20)
CALCIUM: 8.7 mg/dL — AB (ref 8.9–10.3)
CO2: 26 mmol/L (ref 22–32)
Chloride: 104 mmol/L (ref 101–111)
Creatinine, Ser: 1.12 mg/dL (ref 0.61–1.24)
GFR calc Af Amer: >60 mL/min (ref >60)
GFR calc non Af Amer: >60 mL/min (ref >60)
GLUCOSE: 226 mg/dL — AB (ref 65–99)
Potassium: 4.3 mmol/L (ref 3.5–5.1)
SODIUM: 138 mmol/L (ref 135–145)

## 2014-09-13 LAB — URINALYSIS, ROUTINE W REFLEX MICROSCOPIC
Bilirubin Urine: NEGATIVE
Glucose, UA: 250 mg/dL — AB
Hgb urine dipstick: NEGATIVE
Ketones, ur: NEGATIVE mg/dL
Leukocytes, UA: NEGATIVE
Nitrite: NEGATIVE
PH: 6 (ref 5.0–8.0)
Protein, ur: NEGATIVE mg/dL
Specific Gravity, Urine: 1.023 (ref 1.005–1.030)
UROBILINOGEN UA: 0.2 mg/dL (ref 0.0–1.0)

## 2014-09-13 MED ORDER — SODIUM CHLORIDE 0.9 % IV BOLUS (SEPSIS)
1000.0000 mL | Freq: Once | INTRAVENOUS | Status: AC
  Administered 2014-09-13: 1000 mL via INTRAVENOUS

## 2014-09-13 NOTE — ED Provider Notes (Signed)
CSN: [id number]     Arrival date & time 09/13/14  0945 History   First MD Initiated Contact with Patient 09/13/14 1006     Chief Complaint  Patient presents with  . Fall  . Hip Pain  . Shoulder Pain     (Consider location/radiation/quality/duration/timing/severity/associated sxs/prior Treatment) HPI  Pt presenting with c/o left hip and right shoulder pain. He has been having difficulties at home for the past several weeks after getting out of rehab- he has difficulty walking and frequent falls.  He and wife state his most recent fall was approx 10 days ago.  Denies striking head.  He has crhonic pain in hips and legs nad shoulder.  His PMD and Education officer, museum with [facility] are working to place him at this time into a SNF.  Wife called EMS today as she felt she could not help him at home any further.  No fever/chills. No vomiting.  He has been eating and drinking.  There are no other associated systemic symptoms, there are no other alleviating or modifying factors.   Past Medical History  Diagnosis Date  . Depression   . Diabetes mellitus   . COPD (chronic obstructive pulmonary disease)   . Adhesive capsulitis   . MRSA bacteremia     2011 - possible endocarditis, received 6 weeks IV treatment  . Hyperlipidemia   . Anxiety   . GERD (gastroesophageal reflux disease)   . CAD (coronary artery disease)   . 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 CVA (cerebral infarction) Questionable history  . Diverticulosis   . Osteomyelitis 2012    Sternoclavicular joint   . Chronic back pain   . Angina   . Myocardial infarction   . CHF (congestive heart failure)   . Shortness of breath   . Sleep apnea   . Chronic kidney disease     hx of BPH  . Neuromuscular disorder     HX of diabetic periferal neuropathy  . Hypertension   . Peripheral vascular disease   . On home oxygen therapy     "1.5L prn" (04/12/2014)  . Falls frequently 06/2014   Past Surgical History   Procedure Laterality Date  . Septic arthritis      Removal of infected CABG wire by Dr Arlyce Dice - 2011  . Coronary artery bypass graft  1992  . Fracture surgery  1980s    Hip  . Spine surgery  2002    Cervical fusion vertebroplasty C3-4-5  . Coronary stent placement  1999, 2002  . Doppler echocardiography  Sept 2011    EF 50-55% with some impaired diastolic relaxation  . Colonoscopy  2006     Multiple polyps removed, repeat in 5 years  . Right iliopopliteal bypass - [place]  . Femoral-popliteal bypass graft  2008    Left  . Cardiac catheterization  October 18, 2010    Known obstructive disease, no further blockages  . Cardiac catheterization  03/17/2005    EF 35-40%  . US echocardiography  03/04/2006    EF 50-55%  . Amputation  03/16/2011    Procedure: AMPUTATION DIGIT;  Surgeon: Angelia Mould, MD;  Location: [facility] OR;  Service: Vascular;  Laterality: Left;  Great toe  . Hip arthroplasty Left    Family History  Problem Relation Age of Onset  . Heart disease Father   . Heart disease Mother    History  Substance Use Topics  . Smoking status:  Former Smoker    Quit date: 06/19/2000  . Smokeless tobacco: Never Used  . Alcohol Use: No    Review of Systems  ROS reviewed and all otherwise negative except for mentioned in HPI    Allergies  Vancomycin and Diazepam  Home Medications   Prior to Admission medications   Medication Sig Start Date End Date Taking? Authorizing Provider  acetaminophen (TYLENOL) 650 MG CR tablet Take 650 mg by mouth every 8 (eight) hours as needed for pain.   Yes Historical Provider, MD  acetaminophen-codeine (TYLENOL #4) 300-60 MG per tablet Take 1-2 tablets by mouth every 8 (eight) hours as needed for moderate pain. 08/29/14  Yes Alveda Reasons, MD  bisoprolol (ZEBETA) 5 MG tablet Take 0.5 tablets (2.5 mg total) by mouth daily. 05/10/14  Yes Alveda Reasons, MD  Blood Glucose Monitoring Suppl (ONE TOUCH ULTRA 2) W/DEVICE KIT Please supply  patient with 1 glucometer device to use 4 times daily to check blood sugars.  Dx code: 250.0 10/19/13  Yes Alveda Reasons, MD  glucose blood test strip Use as instructed.  One Step Ultra 2 strips. 08/23/14  Yes Alveda Reasons, MD  insulin aspart (NOVOLOG) 100 UNIT/ML injection Use sliding scale as prescribed:  0 - 150 0 units; 151 - 200 2 units, 201 - 250 4 units; 251 - 200 6 units, 301 - 350 8 units Patient taking differently: Inject 0-10 Units into the skin 3 (three) times daily. Use sliding scale as prescribed:0-150=0 units, 151-200=2 units, 201-250=4 units, 251-300=6 units, 301-350=8 units, 351-400=10 units, if CBG is greater than 401 call MD 06/06/14  Yes Alveda Reasons, MD  insulin NPH Human (HUMULIN N,NOVOLIN N) 100 UNIT/ML injection Inject 0.15 mLs (15 Units total) into the skin 2 (two) times daily before a meal. 06/06/14  Yes Alveda Reasons, MD  Insulin Syringe-Needle U-100 (B-D INS SYRINGE 0.5CC/30GX1/2") 30G X 1/2" 0.5 ML MISC For use with insulin 01/20/14  Yes Alveda Reasons, MD  lisinopril (PRINIVIL,ZESTRIL) 2.5 MG tablet Take 2.5 mg by mouth daily.  06/06/14  Yes Historical Provider, MD  LORazepam (ATIVAN) 0.5 MG tablet Take 0.5 tablets (0.25 mg total) by mouth at bedtime. 07/06/14  Yes Alveda Reasons, MD  meclizine (ANTIVERT) 25 MG tablet Take 1 tablet (25 mg total) by mouth 3 (three) times daily as needed for dizziness. 08/29/14  Yes Alveda Reasons, MD  mirtazapine (REMERON) 15 MG tablet Take 1 tablet (15 mg total) by mouth at bedtime. 07/10/14  Yes Alveda Reasons, MD  NEEDLE, DISP, 30 G (B-D DISP NEEDLE 30GX1") 30G X 1" MISC For using with insulin. 01/20/14  Yes Alveda Reasons, MD  nitroGLYCERIN (NITROSTAT) 0.4 MG SL tablet Place 1 tablet (0.4 mg total) under the tongue as needed. For chest pains Patient taking differently: Place 0.4 mg under the tongue as needed for chest pain.  10/19/13  Yes Alveda Reasons, MD  omeprazole (PRILOSEC) 40 MG capsule Take 1 capsule (40 mg total)  by mouth daily. 05/10/14  Yes Alveda Reasons, MD  simvastatin (ZOCOR) 80 MG tablet Take 1 tablet (80 mg total) by mouth daily. Patient taking differently: Take 80 mg by mouth daily at 6 PM.  06/06/14  Yes Alveda Reasons, MD  tamsulosin (FLOMAX) 0.4 MG CAPS capsule Take 1 capsule (0.4 mg total) by mouth daily after supper. 10/19/13  Yes Alveda Reasons, MD  torsemide (DEMADEX) 20 MG tablet TAKE ONE TABLET BY MOUTH EVERY DAY Patient  taking differently: Take 20 mg by mouth daily.  07/10/14  Yes Alveda Reasons, MD  venlafaxine XR (EFFEXOR-XR) 37.5 MG 24 hr capsule TAKE ONE CAPSULE BY MOUTH ONCE DAILY Patient taking differently: Take 37.5 mg by mouth daily.  05/05/14  Yes Alveda Reasons, MD  warfarin (COUMADIN) 5 MG tablet Take 1 tablet (5 mg total) by mouth daily at 6 PM. 06/28/14  Yes Alveda Reasons, MD  lidocaine (LIDODERM) 5 % Place 1 patch onto the skin daily. Remove & Discard patch within 12 hours or as directed by MD Patient not taking: Reported on 08/24/2014 05/05/14   Alveda Reasons, MD   BP 114/57 mmHg  Pulse 85  Temp(Src) 97.2 F (36.2 C) (Oral)  Resp 18  SpO2 96%  Vitals reviewed Physical Exam  Physical Examination: General appearance - alert, well appearing, and in no distress Mental status - alert, oriented to person, place, and time Eyes - no conjunctival injection, no scleral icterus Mouth - mucous membranes moist, pharynx normal without lesions Chest - clear to auscultation, no wheezes, rales or rhonchi, symmetric air entry Heart - normal rate, regular rhythm, normal S1, S2, no murmurs, rubs, clicks or gallops Abdomen - soft, nontender, nondistended, no masses or organomegaly Neurological - alert, oriented, normal speech Musculoskeletal - ttp over left hip joint, ttp over right shoulder, no joint tenderness, deformity or swelling Extremities - peripheral pulses normal, no pedal edema, no clubbing or cyanosis Skin - normal coloration and turgor, no rashes  ED Course   Procedures (including critical care time) Labs Review Labs Reviewed  CBC - Abnormal; Notable for the following:    RDW 16.2 (*)    All other components within normal limits  BASIC METABOLIC PANEL - Abnormal; Notable for the following:    Glucose, Bld 226 (*)    BUN 23 (*)    Calcium 8.7 (*)    All other components within normal limits  URINALYSIS, ROUTINE W REFLEX MICROSCOPIC (NOT AT [facility]) - Abnormal; Notable for the following:    Glucose, UA 250 (*)    All other components within normal limits    Imaging Review Dg Shoulder Right  09/13/2014   CLINICAL DATA:  Fall.  Shoulder pain  EXAM: RIGHT SHOULDER - 2+ VIEW  COMPARISON:  03/16/2011  FINDINGS: Advanced degenerative change in the glenohumeral joint with extensive spurring and joint space narrowing. Narrowing of the acromial humeral distance compatible with rotator cuff disease. Mild degenerative change in the [facility] joint  Negative for fracture.  IMPRESSION: Advanced degenerative change in the shoulder joint. Negative for fracture.   Electronically Signed   By: Franchot Gallo M.D.   On: 09/13/2014 10:32   Dg Hip Unilat With Pelvis 2-3 Views Left  09/13/2014   CLINICAL DATA:  Fall last week with worsening left hip pain.  EXAM: LEFT HIP (WITH PELVIS) 2-3 VIEWS  COMPARISON:  09/04/2014  FINDINGS: Changes of left hip arthroplasty again noted with mild protrusio acetabuli, stable. Extensive metallic fragments/shrapnel overlying the left hip joint space. No acute fracture, subluxation or dislocation. No change since prior study.  IMPRESSION: No acute bony abnormality.   Electronically Signed   By: Rolm Baptise M.D.   On: 09/13/2014 10:34     EKG Interpretation None      MDM   Final diagnoses:  Fall, initial encounter  Pain in left hip  Osteoarthritis of right shoulder, unspecified osteoarthritis type    Pt presenting with pain from fall at home- he has had  chronic difficulty with walking and generalized weakness at home with multiple  falls.  Xrays show some chronic changes but no acute findings.   Xray images viewed and interpreted by me as well.  Prior records reviewed and considered during this visit .  Pt has been seen by family medicine team during prior visits and not thought to meet criteria for admission.  They are currently working with Education officer, museum for SNF placement.  Case management and social work have seen patient today.   I have signed FL2 to help the process along for them.      12:31 PM case management has seen patient, she is going to call social work to help with SNF placement that is already in the works on an outpatient basis.    2:05 PM i have signed FL2 form for the patient.   Alfonzo Beers, MD 09/13/14 [phone]

## 2014-09-13 NOTE — ED Notes (Signed)
Pt currently refusing lunch tray.  This RN placed the tray at bedside and informed the Pt to "hit the call light" when he's hungry.

## 2014-09-13 NOTE — ED Notes (Signed)
Pt back from x-ray.

## 2014-09-13 NOTE — ED Notes (Signed)
Pt to xray

## 2014-09-13 NOTE — ED Notes (Signed)
Wife at bedside now. Reports she is unable to take care of husband anymore. Reports he has not gotten out of bed in 2 weeks. Pt before 2 weeks ago was in rehab came home and then fell and has been bed bound since then.

## 2014-09-13 NOTE — Care Management Note (Addendum)
Case Management Note  Patient Details  Name: REFUGIO VANDEVOORDE MRN: [id number] Date of Birth: 11/19/1940  Subjective/Objective:         Pt with [facility] health services after d/c from [facility] snf 3 weeks ago, to home with wife, (care giver with her own medical issues).  Pt returned to [facility] ED after finding out his pcp is out of office and unable to sign forms for [facility] SW.  Pt reports he wants to go to snf When asked about [facility] he asked "what are they going to fo for me now?"  Pt with hip surgery in 1992 plus with knee and shoulder issues States he has not walked in a long time Wife reports after recent snf d/c home he walked to kitchen and fell.  Pt reports not really ambulating well since 1992.  Pt is agreeing to snf placement Already with home health services. Pt and wife tearful in room when confirming wife not able to care for pt at home Wife previously was memory care CNA until she was no longer able to work. Married for 30+ years. Gleaned from discussion with pt and wife that pt had not been very active during PT sessions offered in snf or at home. Wife states she is not even able to get pt to bedside commode Wife stated HHPT stated "there is not more they can do" for pt  CM noted continued to lift his head up off flat stretcher as Cm spoke with him. Cm offered to raise his head up but pt refused stating he preferred to be flat.  Pt stated he wanted for someone to find something to work in his imaging reports to make him better.  Reports having 3 in last few months but nothing found and he is still in pain.    Action/Plan:   CM reviewed in details medicare guidelines, home health [facility]) (length of stay in home, types of [facility] staff available, coverage, primary caregiver, up to 24 hrs before services may be started) and Private duty nursing (PDN-coverage, length of stay in the home types of staff available). CM reviewed availability of [place] SW to assist pcp to get pt to snf (if desired  disposition) from the community level. CM provided pt/family with a list of [place] and PDN Wife states "they have already been in" Spoke with Deneise Lever at [facility] to get [facility] SW # [place] [phone] Cm discussed with EDP who is willing to assist with to [place].  Also advocated pt wanted to eat and drink now  1307 Cm spoke with [name] of [facility].  Pt owns money at [facility], been there x 2 ? Skilled days left but aware that at Day 21 there is a $160 co-pay Reports pcp office covering MD does not want to sign FL2 that is at Dr Mingo Amber office (pcp) without seeing pt.   SW was trying get to home try to assist with medicaid but pt brought to [facility] ED  1316 CM updated ED SW, Cyril Mourning PT order entered PT contacted  1322 spoke with staff at [phone] to call in PT referral   Expected Discharge Date:   09/13/14               Expected Discharge Plan:     In-House Referral:   ED SW  Discharge planning Services    home health services active  Post Acute Care Choice:    home health services  active Choice offered to:   pt  DME Arranged:   Has bedside commode DME Agency:     HH Arranged:    HHRN, SW, PT [place] Agency:   [facility]  Status of Service:   completed   Additional Comments:  7035 made [facility] aware pt being d/c back home [place], PT Sw to be resumed..EDP signed FL2 Pt needs HHSW to assist with Kelle Darting, Verlin Dike, RN 09/13/2014, 12:58 PM

## 2014-09-13 NOTE — ED Notes (Signed)
Spoke to [employer], patient is still on the list for [employer].

## 2014-09-13 NOTE — ED Notes (Signed)
Nurse is getting blood 

## 2014-09-13 NOTE — ED Notes (Signed)
Case Manager at bedside

## 2014-09-13 NOTE — Discharge Instructions (Signed)
Return to the ED with any concerns including vomiting and not able to keep down liquids, difficulty breathing, fever/chills, decreased level of alertness/lethargy, or any other alarming symptoms  I have filled out the [facility] form that you needed for the social worker to continue working on your skilled nursing facility placement

## 2014-09-13 NOTE — ED Notes (Signed)
[facility] arrived to pick up patient at this time.

## 2014-09-13 NOTE — Progress Notes (Signed)
EDCM discussed patient with EDP who has added OT and nursing aide to home health services.  [facility] referral placed.  [facility] informed patient regarding home health services and [facility] referral.  Patient waiting for transport home.

## 2014-09-13 NOTE — ED Notes (Signed)
Pt alert and oriented x4. Respirations even and unlabored, bilateral symmetrical rise and fall of chest. Skin warm and dry. In no acute distress. Denies needs.   

## 2014-09-13 NOTE — Telephone Encounter (Signed)
[facility]: Pt is completely non ambulatory. Is in extreme pain. EMS was called and pt was transported to ED

## 2014-09-13 NOTE — ED Notes (Signed)
CSW at bedside.

## 2014-09-13 NOTE — ED Notes (Signed)
Bed: PO14 Expected date:  Expected time:  Means of arrival:  Comments: Ems- fall

## 2014-09-13 NOTE — Progress Notes (Deleted)
CSW reached out to [place] secretary to ensure that [facility] has received the "LOG", which will help pay for services in order for the pt to go there. CSW is awaiting confirmation.  Once verified CSW will call [facility] to arrange transportation for the pt to be admitted to [facility].  Willette Brace [phone] ED CSW 09/13/2014 3:47 PM

## 2014-09-13 NOTE — ED Notes (Signed)
Bed: WHALD Expected date:  Expected time:  Means of arrival:  Comments: 

## 2014-09-13 NOTE — ED Notes (Addendum)
Per ems pt is from home, hx of multiple recent falls. Last fall 1.5 weeks ago, since then pt has been laid up in bed. Home health nurse called ems today. Pt c/o left hip and right shoulder pain.   Upon rn assessment pt alert and oriented x4, left hip and right shoulder pain 6/10. Pt reports he fell a little over 1 week ago.

## 2014-09-14 NOTE — Telephone Encounter (Signed)
Thank you for the update.  [place] the [place] but it sounds like Ed is not doing well at home any longer.  There was supposedly an FL2 form at the office that was to be signed on Monday.

## 2014-09-15 ENCOUNTER — Other Ambulatory Visit: Payer: Self-pay | Admitting: Internal Medicine

## 2014-09-18 ENCOUNTER — Non-Acute Institutional Stay (SKILLED_NURSING_FACILITY): Payer: Medicare Other | Admitting: Adult Health

## 2014-09-18 ENCOUNTER — Encounter: Payer: Self-pay | Admitting: *Deleted

## 2014-09-18 DIAGNOSIS — Z7901 Long term (current) use of anticoagulants: Secondary | ICD-10-CM

## 2014-09-18 DIAGNOSIS — I25810 Atherosclerosis of coronary artery bypass graft(s) without angina pectoris: Secondary | ICD-10-CM

## 2014-09-18 DIAGNOSIS — I10 Essential (primary) hypertension: Secondary | ICD-10-CM

## 2014-09-18 DIAGNOSIS — N400 Enlarged prostate without lower urinary tract symptoms: Secondary | ICD-10-CM | POA: Diagnosis not present

## 2014-09-18 DIAGNOSIS — E7800 Pure hypercholesterolemia, unspecified: Secondary | ICD-10-CM

## 2014-09-18 DIAGNOSIS — E118 Type 2 diabetes mellitus with unspecified complications: Secondary | ICD-10-CM | POA: Diagnosis not present

## 2014-09-18 DIAGNOSIS — I5022 Chronic systolic (congestive) heart failure: Secondary | ICD-10-CM

## 2014-09-18 DIAGNOSIS — K219 Gastro-esophageal reflux disease without esophagitis: Secondary | ICD-10-CM | POA: Diagnosis not present

## 2014-09-18 DIAGNOSIS — M159 Polyosteoarthritis, unspecified: Secondary | ICD-10-CM | POA: Diagnosis not present

## 2014-09-18 NOTE — Telephone Encounter (Signed)
Forms placed in provider box for completion. Katharina Caper, Tania Steinhauser D, [place]

## 2014-09-19 ENCOUNTER — Encounter: Payer: Self-pay | Admitting: Adult Health

## 2014-09-19 ENCOUNTER — Non-Acute Institutional Stay (SKILLED_NURSING_FACILITY): Payer: Medicare Other | Admitting: Internal Medicine

## 2014-09-19 ENCOUNTER — Encounter: Payer: Self-pay | Admitting: Internal Medicine

## 2014-09-19 ENCOUNTER — Other Ambulatory Visit: Payer: Self-pay | Admitting: *Deleted

## 2014-09-19 DIAGNOSIS — F418 Other specified anxiety disorders: Secondary | ICD-10-CM | POA: Diagnosis not present

## 2014-09-19 DIAGNOSIS — I10 Essential (primary) hypertension: Secondary | ICD-10-CM

## 2014-09-19 DIAGNOSIS — I739 Peripheral vascular disease, unspecified: Secondary | ICD-10-CM

## 2014-09-19 DIAGNOSIS — M159 Polyosteoarthritis, unspecified: Secondary | ICD-10-CM

## 2014-09-19 DIAGNOSIS — Z7901 Long term (current) use of anticoagulants: Secondary | ICD-10-CM | POA: Diagnosis not present

## 2014-09-19 DIAGNOSIS — E118 Type 2 diabetes mellitus with unspecified complications: Secondary | ICD-10-CM | POA: Diagnosis not present

## 2014-09-19 DIAGNOSIS — J449 Chronic obstructive pulmonary disease, unspecified: Secondary | ICD-10-CM

## 2014-09-19 DIAGNOSIS — I5022 Chronic systolic (congestive) heart failure: Secondary | ICD-10-CM

## 2014-09-19 DIAGNOSIS — R2681 Unsteadiness on feet: Secondary | ICD-10-CM

## 2014-09-19 DIAGNOSIS — R600 Localized edema: Secondary | ICD-10-CM | POA: Diagnosis not present

## 2014-09-19 NOTE — Progress Notes (Signed)
Patient ID: [name], male   DOB: 06/19/1940, 74 y.o.   MRN: [id number]    HISTORY AND PHYSICAL   DATE: 09/19/14  Location:  [facility]    Place of Service: SNF [id number])   Extended Emergency Contact Information Primary Emergency Contact: Kobey, Sides Address: [place], [place] Mobile Phone: [phone] Relation: Spouse  Advanced Directive information  DNR  Chief Complaint  Patient presents with  . 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 He was having difficulty at home and had multiple falls. He has chronic pain in hips , legs and shoulders.  Today he c/o "pain all over" and begins to cry stating "I miss my wife" he then goes on to say that she visits him daily but he really wants to go home. No nursing issues. No falls. He is a poor historian due to psych issue (depression). Hx obtained from chart  Depression/anxiety - takes lorazepam, remeron and effexor er  DM - on humulin N and novolog. CBG 178 today  PE/PAD - on coumadin  CAD/CHF - stable on bisoprolol, lisinopril, zocor, torsemide. Has not needed SLNTG  COPD - stable and no recent exacerbations  Hyperlipidemia - stable on statin  Chronic back pain - stable on lidoderm and tylenol w codeine  GERD - controlled with omeprazole  BPH - stable on flomax  Past Medical History  Diagnosis Date  . Depression   . Diabetes mellitus   . COPD (chronic obstructive pulmonary disease)   . Adhesive capsulitis   . MRSA bacteremia     2011 - possible endocarditis, received 6 weeks IV treatment  . Hyperlipidemia   . Anxiety   . GERD (gastroesophageal reflux disease)   . CAD (coronary artery disease)   . PE (pulmonary embolism) 10-15 years ago   Lifelong Coumadin  . CVA (cerebral infarction) Questionable history  . Diverticulosis   . Osteomyelitis 2012    Sternoclavicular joint   . Chronic back pain   . Angina   . Myocardial infarction   . CHF (congestive heart failure)   . Shortness of breath   . Sleep apnea   . Chronic kidney disease     hx of BPH  . Neuromuscular disorder     HX of diabetic periferal neuropathy  . Hypertension   . Peripheral vascular disease   . On home oxygen therapy     "1.5L prn" (04/12/2014)  . Falls frequently 06/2014    Past Surgical History  Procedure Laterality Date  . Septic arthritis      Removal of infected CABG wire by Dr Arlyce Dice - 2011  . Coronary artery bypass graft  1992  . Fracture surgery  1980s    Hip  . Spine surgery  2002    Cervical fusion vertebroplasty C3-4-5  . Coronary stent placement  1999, 2002  . Doppler echocardiography  Sept 2011    EF 50-55% with some impaired diastolic relaxation  . Colonoscopy  2006     Multiple polyps removed, repeat in 5 years  . Right iliopopliteal bypass - [facility]  . Femoral-popliteal bypass graft  2008    Left  . Cardiac catheterization  October 18, 2010    Known obstructive disease, no further blockages  . Cardiac catheterization  03/17/2005    EF 35-40%  . US echocardiography  03/04/2006    EF 50-55%  . Amputation  03/16/2011    Procedure: AMPUTATION DIGIT;  Surgeon: Angelia Mould, MD;  [facility] OR;  Service: Vascular;  Laterality: Left;  Great toe  . Hip arthroplasty Left     Patient Care Team: Alveda Reasons, MD as PCP - General Darlin Coco, MD (Cardiology) Clent Jacks, MD (Ophthalmology)  History   Social History  . Marital Status: Married    Spouse Name: Butch Penny  . Number of Children: 3  . Years of Education: 12   Occupational History  . Retired-machinest/welder    Social History Main Topics  . Smoking status: Former Smoker    Quit date: 06/19/2000  . Smokeless tobacco: Never Used  . Alcohol  Use: No  . Drug Use: No  . Sexual Activity: No   Other Topics Concern  . Not on file   Social History Narrative   Health Care POA: TBD   Emergency Contact: wife, Butch Penny [phone]   End of Life Plan: pt does not have AD and not interested.   Who lives with you: Butch Penny and step-daughter-Betty   Any pets: none   Diet: Pt has a varied diet of protein, starch and vegetables.   Exercise: Pt does not have regular exercise routine.   Seatbelts: Pt reports wearing seatbelt when in vehicles.    Hobbies: listening to car races.               reports that he quit smoking about 14 years ago. He has never used smokeless tobacco. He reports that he does not drink alcohol or use illicit drugs.  Family History  Problem Relation Age of Onset  . Heart disease Father   . Heart disease Mother    Family Status  Relation Status Death Age  . Father Deceased   . Mother Deceased     Immunization History  Administered Date(s) Administered  . Influenza Split 12/12/2010  . Influenza Whole 12/22/2005, 12/14/2007  . Influenza,inj,Quad PF,36+ Mos 01/20/2013, 12/09/2013  . Pneumococcal Conjugate-13 10/10/2013  . Pneumococcal Polysaccharide-23 11/23/1999, 03/25/2003, 05/25/2011  . Td 10/23/2003  . Tdap 07/17/2014    Allergies  Allergen Reactions  . Vancomycin Other (See Comments)    "red man syndrome"  . Diazepam Anxiety    REACTION: makes patient cry    Medications: Patient's Medications  New Prescriptions   No medications on file  Previous Medications   ACETAMINOPHEN (TYLENOL) 650 MG CR TABLET    Take 650 mg by mouth every 8 (eight) hours as needed for pain.   ACETAMINOPHEN-CODEINE (TYLENOL #4) 300-60 MG PER TABLET    Take 1-2 tablets by mouth every 8 (eight) hours as needed for moderate pain.   BISOPROLOL (ZEBETA) 5 MG TABLET    Take 0.5 tablets (2.5 mg total) by mouth daily.   BLOOD GLUCOSE MONITORING SUPPL (ONE TOUCH ULTRA 2) W/DEVICE KIT    Please supply patient with 1 glucometer  device to use 4 times daily to check blood sugars.  Dx code: 250.0   GLUCOSE BLOOD TEST STRIP    Use as instructed.  One Step Ultra 2 strips.   INSULIN ASPART (NOVOLOG) 100 UNIT/ML INJECTION    Use sliding scale as prescribed:  0 - 150 0 units; 151 - 200 2 units, 201 - 250 4 units; 251 - 200  6 units, 301 - 350 8 units   INSULIN NPH HUMAN (HUMULIN N,NOVOLIN N) 100 UNIT/ML INJECTION    Inject 0.15 mLs (15 Units total) into the skin 2 (two) times daily before a meal.   INSULIN SYRINGE-NEEDLE U-100 (B-D INS SYRINGE 0.5CC/30GX1/2") 30G X 1/2" 0.5 ML MISC    For use with insulin   LIDOCAINE (LIDODERM) 5 %    Place 1 patch onto the skin daily. Remove & Discard patch within 12 hours or as directed by MD   LISINOPRIL (PRINIVIL,ZESTRIL) 2.5 MG TABLET    Take 2.5 mg by mouth daily.    LORAZEPAM (ATIVAN) 0.5 MG TABLET    Take 0.5 tablets (0.25 mg total) by mouth at bedtime.   MECLIZINE (ANTIVERT) 25 MG TABLET    Take 1 tablet (25 mg total) by mouth 3 (three) times daily as needed for dizziness.   MIRTAZAPINE (REMERON) 15 MG TABLET    Take 1 tablet (15 mg total) by mouth at bedtime.   NEEDLE, DISP, 30 G (B-D DISP NEEDLE 30GX1") 30G X 1" MISC    For using with insulin.   NITROGLYCERIN (NITROSTAT) 0.4 MG SL TABLET    Place 1 tablet (0.4 mg total) under the tongue as needed. For chest pains   OMEPRAZOLE (PRILOSEC) 40 MG CAPSULE    Take 1 capsule (40 mg total) by mouth daily.   SIMVASTATIN (ZOCOR) 80 MG TABLET    Take 1 tablet (80 mg total) by mouth daily.   TAMSULOSIN (FLOMAX) 0.4 MG CAPS CAPSULE    Take 1 capsule (0.4 mg total) by mouth daily after supper.   TORSEMIDE (DEMADEX) 20 MG TABLET    TAKE ONE TABLET BY MOUTH EVERY DAY   VENLAFAXINE XR (EFFEXOR-XR) 37.5 MG 24 HR CAPSULE    TAKE ONE CAPSULE BY MOUTH ONCE DAILY   WARFARIN (COUMADIN) 5 MG TABLET    Take 1 tablet (5 mg total) by mouth daily at 6 PM.  Modified Medications   No medications on file  Discontinued Medications   No medications on file     Review of Systems  Unable to perform ROS: Psychiatric disorder    Filed Vitals:   09/19/14 1838  BP: 126/72  Pulse: 78  Temp: 97.7 F (36.5 C)  Weight: 248 lb (112.492 kg)   Body mass index is 37.72 kg/(m^2).  Physical Exam  Constitutional: He appears well-developed and well-nourished.  Looks distraught in NAD. Tearful at bedside, sitting in w/c  HENT:  Mouth/Throat: Oropharynx is clear and moist.  Eyes: Pupils are equal, round, and reactive to light. No scleral icterus.  Neck: Neck supple. Carotid bruit is not present. No thyromegaly present.  Cardiovascular: Normal rate, regular rhythm and intact distal pulses.  Exam reveals no friction rub. Gallop: 1/6 SEM.   Murmur heard. +1 pitting R>L LE edema. No calf TTP  Pulmonary/Chest: Effort normal and breath sounds normal. He has no wheezes. He has no rales. He exhibits no tenderness.  Abdominal: Soft. Bowel sounds are normal. He exhibits no distension, no abdominal bruit, no pulsatile midline mass and no mass. There is tenderness (generally). There is no rebound and no guarding.  Musculoskeletal: He exhibits edema and tenderness.  Lymphadenopathy:    He has no cervical adenopathy.  Neurological: He is alert.  Skin: Skin is warm and dry. No rash noted.  Psychiatric: His behavior is normal. He exhibits a depressed mood.     Labs reviewed: Admission on 09/13/2014, Discharged on 09/13/2014  Component Date Value Ref  Range Status  . WBC 09/13/2014 10.4  4.0 - 10.5 K/uL Final  . RBC 09/13/2014 5.10  4.22 - 5.81 MIL/uL Final  . Hemoglobin 09/13/2014 13.3  13.0 - 17.0 g/dL Final  . HCT 09/13/2014 41.7  39.0 - 52.0 % Final  . MCV 09/13/2014 81.8  78.0 - 100.0 fL Final  . MCH 09/13/2014 26.1  26.0 - 34.0 pg Final  . MCHC 09/13/2014 31.9  30.0 - 36.0 g/dL Final  . RDW 09/13/2014 16.2* 11.5 - 15.5 % Final  . Platelets 09/13/2014 188  150 - 400 K/uL Final  . Sodium 09/13/2014 138  135 - 145 mmol/L Final  . Potassium 09/13/2014  4.3  3.5 - 5.1 mmol/L Final  . Chloride 09/13/2014 104  101 - 111 mmol/L Final  . CO2 09/13/2014 26  22 - 32 mmol/L Final  . Glucose, Bld 09/13/2014 226* 65 - 99 mg/dL Final  . BUN 09/13/2014 23* 6 - 20 mg/dL Final  . Creatinine, Ser 09/13/2014 1.12  0.61 - 1.24 mg/dL Final  . Calcium 09/13/2014 8.7* 8.9 - 10.3 mg/dL Final  . GFR calc non Af Amer 09/13/2014 >60  >60 mL/min Final  . GFR calc Af Amer 09/13/2014 >60  >60 mL/min Final   Comment: (NOTE) The eGFR has been calculated using the CKD EPI equation. This calculation has not been validated in all clinical situations. eGFR's persistently <60 mL/min signify possible Chronic Kidney Disease.   . Anion gap 09/13/2014 8  5 - 15 Final  . Color, Urine 09/13/2014 YELLOW  YELLOW Final  . APPearance 09/13/2014 CLEAR  CLEAR Final  . Specific Gravity, Urine 09/13/2014 1.023  1.005 - 1.030 Final  . pH 09/13/2014 6.0  5.0 - 8.0 Final  . Glucose, UA 09/13/2014 250* NEGATIVE mg/dL Final  . Hgb urine dipstick 09/13/2014 NEGATIVE  NEGATIVE Final  . Bilirubin Urine 09/13/2014 NEGATIVE  NEGATIVE Final  . Ketones, ur 09/13/2014 NEGATIVE  NEGATIVE mg/dL Final  . Protein, ur 09/13/2014 NEGATIVE  NEGATIVE mg/dL Final  . Urobilinogen, UA 09/13/2014 0.2  0.0 - 1.0 mg/dL Final  . Nitrite 09/13/2014 NEGATIVE  NEGATIVE Final  . Leukocytes, UA 09/13/2014 NEGATIVE  NEGATIVE Final   MICROSCOPIC NOT DONE ON URINES WITH NEGATIVE PROTEIN, BLOOD, LEUKOCYTES, NITRITE, OR GLUCOSE <1000 mg/dL.  Anti-coag visit on 08/29/2014  Component Date Value Ref Range Status  . INR 08/29/2014 3.4   Final  Admission on 08/24/2014, Discharged on 08/24/2014  Component Date Value Ref Range Status  . Sodium 08/24/2014 145  135 - 145 mmol/L Final  . Potassium 08/24/2014 4.0  3.5 - 5.1 mmol/L Final  . Chloride 08/24/2014 104  101 - 111 mmol/L Final  . CO2 08/24/2014 27  22 - 32 mmol/L Final  . Glucose, Bld 08/24/2014 144* 65 - 99 mg/dL Final  . BUN 08/24/2014 16  6 - 20 mg/dL Final   . Creatinine, Ser 08/24/2014 1.09  0.61 - 1.24 mg/dL Final  . Calcium 08/24/2014 9.1  8.9 - 10.3 mg/dL Final  . Total Protein 08/24/2014 7.2  6.5 - 8.1 g/dL Final  . Albumin 08/24/2014 4.2  3.5 - 5.0 g/dL Final  . AST 08/24/2014 16  15 - 41 U/L Final  . ALT 08/24/2014 13* 17 - 63 U/L Final  . Alkaline Phosphatase 08/24/2014 63  38 - 126 U/L Final  . Total Bilirubin 08/24/2014 0.5  0.3 - 1.2 mg/dL Final  . GFR calc non Af Amer 08/24/2014 >60  >60 mL/min Final  . GFR calc  Af Amer 08/24/2014 >60  >60 mL/min Final   Comment: (NOTE) The eGFR has been calculated using the CKD EPI equation. This calculation has not been validated in all clinical situations. eGFR's persistently <60 mL/min signify possible Chronic Kidney Disease.   . Anion gap 08/24/2014 14  5 - 15 Final  . WBC 08/24/2014 11.0* 4.0 - 10.5 K/uL Final  . RBC 08/24/2014 5.22  4.22 - 5.81 MIL/uL Final  . Hemoglobin 08/24/2014 13.5  13.0 - 17.0 g/dL Final  . HCT 08/24/2014 43.1  39.0 - 52.0 % Final  . MCV 08/24/2014 82.6  78.0 - 100.0 fL Final  . MCH 08/24/2014 25.9* 26.0 - 34.0 pg Final  . MCHC 08/24/2014 31.3  30.0 - 36.0 g/dL Final  . RDW 08/24/2014 16.4* 11.5 - 15.5 % Final  . Platelets 08/24/2014 164  150 - 400 K/uL Final  . Neutrophils Relative % 08/24/2014 74  43 - 77 % Final  . Neutro Abs 08/24/2014 8.1* 1.7 - 7.7 K/uL Final  . Lymphocytes Relative 08/24/2014 16  12 - 46 % Final  . Lymphs Abs 08/24/2014 1.8  0.7 - 4.0 K/uL Final  . Monocytes Relative 08/24/2014 8  3 - 12 % Final  . Monocytes Absolute 08/24/2014 0.9  0.1 - 1.0 K/uL Final  . Eosinophils Relative 08/24/2014 2  0 - 5 % Final  . Eosinophils Absolute 08/24/2014 0.2  0.0 - 0.7 K/uL Final  . Basophils Relative 08/24/2014 0  0 - 1 % Final  . Basophils Absolute 08/24/2014 0.0  0.0 - 0.1 K/uL Final  . Prothrombin Time 08/24/2014 21.4* 11.6 - 15.2 seconds Final  . INR 08/24/2014 1.86* 0.00 - 1.49 Final  . Troponin i, poc 08/24/2014 0.01  0.00 - 0.08 ng/mL Final   . Comment 3 08/24/2014          Final   Comment: Due to the release kinetics of cTnI, a negative result within the first hours of the onset of symptoms does not rule out myocardial infarction with certainty. If myocardial infarction is still suspected, repeat the test at appropriate intervals.   . Color, Urine 08/24/2014 YELLOW  YELLOW Final  . APPearance 08/24/2014 CLEAR  CLEAR Final  . Specific Gravity, Urine 08/24/2014 1.008  1.005 - 1.030 Final  . pH 08/24/2014 6.5  5.0 - 8.0 Final  . Glucose, UA 08/24/2014 NEGATIVE  NEGATIVE mg/dL Final  . Hgb urine dipstick 08/24/2014 NEGATIVE  NEGATIVE Final  . Bilirubin Urine 08/24/2014 NEGATIVE  NEGATIVE Final  . Ketones, ur 08/24/2014 NEGATIVE  NEGATIVE mg/dL Final  . Protein, ur 08/24/2014 NEGATIVE  NEGATIVE mg/dL Final  . Urobilinogen, UA 08/24/2014 0.2  0.0 - 1.0 mg/dL Final  . Nitrite 08/24/2014 NEGATIVE  NEGATIVE Final  . Leukocytes, UA 08/24/2014 NEGATIVE  NEGATIVE Final   MICROSCOPIC NOT DONE ON URINES WITH NEGATIVE PROTEIN, BLOOD, LEUKOCYTES, NITRITE, OR GLUCOSE <1000 mg/dL.  Marland Kitchen Total CK 08/24/2014 79  49 - 397 U/L Final  Admission on 08/06/2014, Discharged on 08/06/2014  Component Date Value Ref Range Status  . Prothrombin Time 08/06/2014 24.3* 11.6 - 15.2 seconds Final  . INR 08/06/2014 2.16* 0.00 - 1.49 Final  Admission on 07/18/2014, Discharged on 07/21/2014  Component Date Value Ref Range Status  . Glucose-Capillary 07/18/2014 143* 70 - 99 mg/dL Final  . Sodium 07/18/2014 138  135 - 145 mmol/L Final  . Potassium 07/18/2014 3.8  3.5 - 5.1 mmol/L Final  . Chloride 07/18/2014 102  96 - 112 mmol/L Final  .  CO2 07/18/2014 27  19 - 32 mmol/L Final  . Glucose, Bld 07/18/2014 198* 70 - 99 mg/dL Final  . BUN 07/18/2014 11  6 - 23 mg/dL Final  . Creatinine, Ser 07/18/2014 0.96  0.50 - 1.35 mg/dL Final  . Calcium 07/18/2014 8.7  8.4 - 10.5 mg/dL Final  . Total Protein 07/18/2014 5.9* 6.0 - 8.3 g/dL Final  . Albumin 07/18/2014 3.3*  3.5 - 5.2 g/dL Final  . AST 07/18/2014 17  0 - 37 U/L Final  . ALT 07/18/2014 14  0 - 53 U/L Final  . Alkaline Phosphatase 07/18/2014 58  39 - 117 U/L Final  . Total Bilirubin 07/18/2014 0.4  0.3 - 1.2 mg/dL Final  . GFR calc non Af Amer 07/18/2014 79* >90 mL/min Final  . GFR calc Af Amer 07/18/2014 >90  >90 mL/min Final   Comment: (NOTE) The eGFR has been calculated using the CKD EPI equation. This calculation has not been validated in all clinical situations. eGFR's persistently <90 mL/min signify possible Chronic Kidney Disease.   . Anion gap 07/18/2014 9  5 - 15 Final  . WBC 07/18/2014 10.2  4.0 - 10.5 K/uL Final  . RBC 07/18/2014 4.71  4.22 - 5.81 MIL/uL Final  . Hemoglobin 07/18/2014 12.6* 13.0 - 17.0 g/dL Final  . HCT 07/18/2014 39.0  39.0 - 52.0 % Final  . MCV 07/18/2014 82.8  78.0 - 100.0 fL Final  . MCH 07/18/2014 26.8  26.0 - 34.0 pg Final  . MCHC 07/18/2014 32.3  30.0 - 36.0 g/dL Final  . RDW 07/18/2014 16.4* 11.5 - 15.5 % Final  . Platelets 07/18/2014 158  150 - 400 K/uL Final  . Neutrophils Relative % 07/18/2014 69  43 - 77 % Final  . Neutro Abs 07/18/2014 7.0  1.7 - 7.7 K/uL Final  . Lymphocytes Relative 07/18/2014 21  12 - 46 % Final  . Lymphs Abs 07/18/2014 2.1  0.7 - 4.0 K/uL Final  . Monocytes Relative 07/18/2014 8  3 - 12 % Final  . Monocytes Absolute 07/18/2014 0.9  0.1 - 1.0 K/uL Final  . Eosinophils Relative 07/18/2014 2  0 - 5 % Final  . Eosinophils Absolute 07/18/2014 0.2  0.0 - 0.7 K/uL Final  . Basophils Relative 07/18/2014 0  0 - 1 % Final  . Basophils Absolute 07/18/2014 0.0  0.0 - 0.1 K/uL Final  . Troponin I 07/18/2014 <0.03  <0.031 ng/mL Final   Comment:        NO INDICATION OF MYOCARDIAL INJURY.   . B Natriuretic Peptide 07/18/2014 115.7* 0.0 - 100.0 pg/mL Final  . Sed Rate 07/18/2014 25* 0 - 16 mm/hr Final  . Vitamin B-12 07/18/2014 224  211 - 911 pg/mL Final   Performed at [facility]  . Sodium 07/19/2014 138  135 - 145 mmol/L  Final  . Potassium 07/19/2014 4.4  3.5 - 5.1 mmol/L Final  . Chloride 07/19/2014 106  96 - 112 mmol/L Final  . CO2 07/19/2014 24  19 - 32 mmol/L Final  . Glucose, Bld 07/19/2014 163* 70 - 99 mg/dL Final  . BUN 07/19/2014 11  6 - 23 mg/dL Final  . Creatinine, Ser 07/19/2014 0.92  0.50 - 1.35 mg/dL Final  . Calcium 07/19/2014 9.0  8.4 - 10.5 mg/dL Final  . GFR calc non Af Amer 07/19/2014 80* >90 mL/min Final  . GFR calc Af Amer 07/19/2014 >90  >90 mL/min Final   Comment: (NOTE) The eGFR has been calculated  using the CKD EPI equation. This calculation has not been validated in all clinical situations. eGFR's persistently <90 mL/min signify possible Chronic Kidney Disease.   . Anion gap 07/19/2014 8  5 - 15 Final  . WBC 07/19/2014 9.4  4.0 - 10.5 K/uL Final  . RBC 07/19/2014 5.25  4.22 - 5.81 MIL/uL Final  . Hemoglobin 07/19/2014 14.1  13.0 - 17.0 g/dL Final  . HCT 07/19/2014 43.2  39.0 - 52.0 % Final  . MCV 07/19/2014 82.3  78.0 - 100.0 fL Final  . MCH 07/19/2014 26.9  26.0 - 34.0 pg Final  . MCHC 07/19/2014 32.6  30.0 - 36.0 g/dL Final  . RDW 07/19/2014 16.4* 11.5 - 15.5 % Final  . Platelets 07/19/2014 153  150 - 400 K/uL Final  . Prothrombin Time 07/19/2014 19.4* 11.6 - 15.2 seconds Final  . INR 07/19/2014 1.62* 0.00 - 1.49 Final  . Glucose-Capillary 07/18/2014 137* 70 - 99 mg/dL Final  . Glucose-Capillary 07/18/2014 171* 70 - 99 mg/dL Final  . Glucose-Capillary 07/19/2014 162* 70 - 99 mg/dL Final  . Glucose-Capillary 07/19/2014 136* 70 - 99 mg/dL Final  . Glucose-Capillary 07/19/2014 216* 70 - 99 mg/dL Final  . Prothrombin Time 07/20/2014 21.3* 11.6 - 15.2 seconds Final  . INR 07/20/2014 1.82* 0.00 - 1.49 Final  . Glucose-Capillary 07/19/2014 186* 70 - 99 mg/dL Final  . Glucose-Capillary 07/20/2014 152* 70 - 99 mg/dL Final  . Glucose-Capillary 07/20/2014 202* 70 - 99 mg/dL Final  . Glucose-Capillary 07/20/2014 193* 70 - 99 mg/dL Final  . Prothrombin Time 07/21/2014 25.0* 11.6 -  15.2 seconds Final  . INR 07/21/2014 2.25* 0.00 - 1.49 Final  . Glucose-Capillary 07/20/2014 198* 70 - 99 mg/dL Final  . Glucose-Capillary 07/21/2014 147* 70 - 99 mg/dL Final  . Glucose-Capillary 07/21/2014 227* 70 - 99 mg/dL Final  Admission on 07/17/2014, Discharged on 07/17/2014  Component Date Value Ref Range Status  . WBC 07/17/2014 10.3  4.0 - 10.5 K/uL Final  . RBC 07/17/2014 4.94  4.22 - 5.81 MIL/uL Final  . Hemoglobin 07/17/2014 13.2  13.0 - 17.0 g/dL Final  . HCT 07/17/2014 40.1  39.0 - 52.0 % Final  . MCV 07/17/2014 81.2  78.0 - 100.0 fL Final  . MCH 07/17/2014 26.7  26.0 - 34.0 pg Final  . MCHC 07/17/2014 32.9  30.0 - 36.0 g/dL Final  . RDW 07/17/2014 16.2* 11.5 - 15.5 % Final  . Platelets 07/17/2014 168  150 - 400 K/uL Final  . Neutrophils Relative % 07/17/2014 70  43 - 77 % Final  . Neutro Abs 07/17/2014 7.2  1.7 - 7.7 K/uL Final  . Lymphocytes Relative 07/17/2014 18  12 - 46 % Final  . Lymphs Abs 07/17/2014 1.8  0.7 - 4.0 K/uL Final  . Monocytes Relative 07/17/2014 10  3 - 12 % Final  . Monocytes Absolute 07/17/2014 1.0  0.1 - 1.0 K/uL Final  . Eosinophils Relative 07/17/2014 2  0 - 5 % Final  . Eosinophils Absolute 07/17/2014 0.2  0.0 - 0.7 K/uL Final  . Basophils Relative 07/17/2014 0  0 - 1 % Final  . Basophils Absolute 07/17/2014 0.0  0.0 - 0.1 K/uL Final  . Sodium 07/17/2014 137  135 - 145 mmol/L Final  . Potassium 07/17/2014 3.7  3.5 - 5.1 mmol/L Final  . Chloride 07/17/2014 102  96 - 112 mmol/L Final  . CO2 07/17/2014 23  19 - 32 mmol/L Final  . Glucose, Bld 07/17/2014 233* 70 -  99 mg/dL Final  . BUN 07/17/2014 17  6 - 23 mg/dL Final  . Creatinine, Ser 07/17/2014 1.11  0.50 - 1.35 mg/dL Final  . Calcium 07/17/2014 8.8  8.4 - 10.5 mg/dL Final  . GFR calc non Af Amer 07/17/2014 63* >90 mL/min Final  . GFR calc Af Amer 07/17/2014 73* >90 mL/min Final   Comment: (NOTE) The eGFR has been calculated using the CKD EPI equation. This calculation has not been  validated in all clinical situations. eGFR's persistently <90 mL/min signify possible Chronic Kidney Disease.   . Anion gap 07/17/2014 12  5 - 15 Final  . Prothrombin Time 07/17/2014 27.1* 11.6 - 15.2 seconds Final  . INR 07/17/2014 2.49* 0.00 - 1.49 Final  Anti-coag visit on 06/20/2014  Component Date Value Ref Range Status  . INR 06/20/2014 1.9   Final    Dg Chest 1 View  08/24/2014   CLINICAL DATA:  Pain following recent fall  EXAM: CHEST  1 VIEW  COMPARISON:  May 09, 2014  FINDINGS: There is mild scarring in the left base region. There is no edema or consolidation. Heart is borderline prominent with pulmonary vascularity within normal limits. Patient is status post coronary artery bypass grafting. There is postoperative change in the lower cervical spine. No pneumothorax. No adenopathy. There is arthropathy in the right shoulder, stable.  IMPRESSION: No edema or consolidation. Mild scarring left base. No change in cardiac silhouette.   Electronically Signed   By: Lowella Grip III M.D.   On: 08/24/2014 13:23   Dg Shoulder Right  09/13/2014   CLINICAL DATA:  Fall.  Shoulder pain  EXAM: RIGHT SHOULDER - 2+ VIEW  COMPARISON:  03/16/2011  FINDINGS: Advanced degenerative change in the glenohumeral joint with extensive spurring and joint space narrowing. Narrowing of the acromial humeral distance compatible with rotator cuff disease. Mild degenerative change in the [facility] joint  Negative for fracture.  IMPRESSION: Advanced degenerative change in the shoulder joint. Negative for fracture.   Electronically Signed   By: Franchot Gallo M.D.   On: 09/13/2014 10:32   Dg Hip Unilat With Pelvis 2-3 Views Left  09/13/2014   CLINICAL DATA:  Fall last week with worsening left hip pain.  EXAM: LEFT HIP (WITH PELVIS) 2-3 VIEWS  COMPARISON:  09/04/2014  FINDINGS: Changes of left hip arthroplasty again noted with mild protrusio acetabuli, stable. Extensive metallic fragments/shrapnel overlying the left hip  joint space. No acute fracture, subluxation or dislocation. No change since prior study.  IMPRESSION: No acute bony abnormality.   Electronically Signed   By: Rolm Baptise M.D.   On: 09/13/2014 10:34   Dg Hip Unilat With Pelvis 2-3 Views Left  09/04/2014   CLINICAL DATA:  Fall, upper leg pain  EXAM: LEFT HIP (WITH PELVIS) 2-3 VIEWS  COMPARISON:  08/24/2014  FINDINGS: Left hip arthroplasty.  Cerclage wire fixation of the femoral stem.  Mild acetabular protrusio.  Associated metallic fragments/shrapnel overlying the joint space.  No evidence of hardware fracture or loosening.  No fracture or dislocation is seen.  Overall appearance is unchanged.  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  No fracture or dislocation is seen.  No interval change.   Electronically Signed   By: Julian Hy M.D.   On: 09/04/2014 13:20   Dg Hip Unilat With Pelvis 2-3 Views Left  08/24/2014   CLINICAL DATA:  74 year old male with a history of fall.  EXAM: LEFT HIP (WITH PELVIS) 2-3 VIEWS  COMPARISON:  08/06/2014  FINDINGS: Bony pelvic ring appears intact. No acute bony abnormality identified.  Unremarkable appearance of the proximal right femur. Right hip projects normally over the acetabula.  Surgical changes of left total hip arthroplasty. Metallic shrapnel again projects in the soft tissues of the left hip. Similar appearance of acetabular protrusio.  Surgical clips adjacent to the proximal right femur.  Vascular calcifications.  No perihardware fracture.  IMPRESSION: Negative for acute bony abnormality.  Surgical changes of left total hip arthroplasty, with overlying metallic shrapnel. Appearance is similar to comparison with mild acetabular protrusio.  Signed,  Dulcy Fanny. Earleen Newport, DO  [facility]  [facility]   Electronically Signed   By: Corrie Mckusick D.O.   On: 08/24/2014 13:18   Dg Femur Min 2 Views Left  09/04/2014   CLINICAL DATA:  Status post fall  09/03/2014. Left upper leg pain. Initial encounter.  EXAM: LEFT FEMUR 2 VIEWS  COMPARISON:  Plain films of the right hip this same day.  FINDINGS: No acute bony or joint abnormality is identified. There is partial visualization of the femoral stem of the patient's left hip replacement.  IMPRESSION: No acute abnormality.   Electronically Signed   By: Inge Rise M.D.   On: 09/04/2014 14:01   Dg Femur, Min 2 Views Right  09/04/2014   CLINICAL DATA:  Fall, upper leg pain  EXAM: RIGHT FEMUR 2 VIEWS  COMPARISON:  None.  FINDINGS: No fracture or dislocation is seen.  Hip joint space is preserved.  Degenerative changes of the knee.  Surgical clips related to saphenous vein harvest.  IMPRESSION: No fracture or dislocation is seen.   Electronically Signed   By: Julian Hy M.D.   On: 09/04/2014 13:17     Assessment/Plan   ICD-9-CM ICD-10-CM   1. Depression with anxiety - borderline controlled 300.4 F41.8   2. Osteoarthritis of multiple joints, unspecified osteoarthritis type - pain stable 715.89 M15.9   3. Unsteady gait 781.2 R26.81    with frequent falls  4. Essential hypertension - stable 401.9 I10   5. Bilateral edema of lower extremity - stable 782.3 R60.0   6. Peripheral vascular disease - stable 443.9 I73.9   7. DM type 2, controlled, with complication- stable 945.03 E11.8   8. Chronic systolic heart failure - stable 428.22 I50.22   9. Chronic obstructive pulmonary disease, unspecified COPD, unspecified chronic bronchitis type - stable 496 J44.9   10. Long term current use of anticoagulant therapy- V58.61 Z79.01    due to hx PE and CAD   --may need psych eval  --cont pain regimen as ordered  --cont current meds as ordered  --PT/OT as ordered  --follow INR closely. Next draw 09/26/14  --GOAL: short term rehab and d/c home when medically appropriate. Communicated with pt and nursing.  --will follow  Niema Carrara S. Perlie Gold  [facility] [address] [place], [place] [address] [phone] Cell (Monday-Friday 8 AM - 5 PM) [phone] After 5 PM and follow prompts

## 2014-09-19 NOTE — Telephone Encounter (Signed)
Completed and placed in Tamika's box. 

## 2014-09-19 NOTE — Telephone Encounter (Signed)
Left voice message for [name], Medical Social Worker that form is complete and ready for pick up.  Per voice message Ms. Trammell will be out of the office the week of 09/18/14.  Derl Barrow, RN

## 2014-09-19 NOTE — Patient Outreach (Signed)
[name]) Care Management  09/19/2014  [name] 15-May-1940 [id number]   [name] made an initial attempt to try and contact patient today to perform phone assessment, as well as assess and assist with social work needs and services, without success. A HIPAA compliant message was left on voicemail for patient.  [name] is currently awaiting a return call. After thorough review of patient's EMR (Electronic Medical Record) in [name], [name] noted that patient presented to the Emergency Department at [name] on June 22nd, brought in from home by EMS (Emergency Medical Services), after patient reported multiple recent falls.  In addition, patient admitted that he has been practically bed-bound since his last fall, a week and a half prior, experiencing pain and discomfort in his hip and shoulder.  Patient was thoroughly examined, but not admitted, being sent home with home health services through [facility], arranged by [name], Nurse Case Manager in the Emergency Department.  While awaiting examination at [name], patient also received a visit from [name], Social Work Case Freight forwarder in the Emergency Department, after patient requested placement into a skilled nursing facility.  [name] was able to begin the placement process for patient, securing patient a bed at [name].  Patient did not actually receive skilled nursing placement at [name] until today (June 28th).  Arrangements were made by [name], Home Health Social Worker with [name], while patient was residing at home. [name] was able to converse with [name], who reports that patient's wife, [name] is tired, complaining of no longer being able to adequately care for patient at home.  [name] is thinking long-term placement for patient, but patient believes that he is at [name] to receive  short-term rehabilitative services, with the goal of returning home.  [name] has contacted the Admissions Director/Licensed Clinical Social Worker at [name], to request that [name] be notified when patient's Goals of Care Meeting is scheduled, as [name] would like to make arrangements to be present.  [name] has also requested that [name] receive an invitation to attend patient's Discharge Planning Meeting, to ensure that all discharge arrangements are in place before patient is released from the facility.  [name] is hopeful that with extensive physical and occupational therapies, patient will continue to improve upon strength; gait; mobility, safety and ambulation techniques, enabling patient to perform all activities of daily living independently before returning home.  [name] will continue to follow patient while residing at [name].  [name], BSW, MSW, [facility] [place], [place], [place] [address] [name] [phone]

## 2014-09-19 NOTE — Progress Notes (Signed)
Patient ID: [name], male   DOB: 03/10/1941, 74 y.o.   MRN: [id number]  Armandina Gemma living [place]     Allergies  Allergen Reactions  . Vancomycin Other (See Comments)    "red man syndrome"  . Diazepam Anxiety    REACTION: makes patient cry       Chief Complaint  Patient presents with  . Hospitalization Follow-up    HPI:  He had been having increased difficulty at home. He has had several falls home without injury present. He does have joint pain in hips knees and shoulders. He was taken to the ED for placement for short term rehab as his wife could not provide his care at home. He is here for short term rehab with his foal to return back home.    Past Medical History  Diagnosis Date  . Depression   . Diabetes mellitus   . COPD (chronic obstructive pulmonary disease)   . Adhesive capsulitis   . MRSA bacteremia     2011 - possible endocarditis, received 6 weeks IV treatment  . Hyperlipidemia   . Anxiety   . GERD (gastroesophageal reflux disease)   . CAD (coronary artery disease)   . 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 CVA (cerebral infarction) Questionable history  . Diverticulosis   . Osteomyelitis 2012    Sternoclavicular joint   . Chronic back pain   . Angina   . Myocardial infarction   . CHF (congestive heart failure)   . Shortness of breath   . Sleep apnea   . Chronic kidney disease     hx of BPH  . Neuromuscular disorder     HX of diabetic periferal neuropathy  . Hypertension   . Peripheral vascular disease   . On home oxygen therapy     "1.5L prn" (04/12/2014)  . Falls frequently 06/2014    Past Surgical History  Procedure Laterality Date  . Septic arthritis      Removal of infected CABG wire by Dr Arlyce Dice - 2011  . Coronary artery bypass graft  1992  . Fracture surgery  1980s    Hip  . Spine surgery  2002    Cervical fusion vertebroplasty C3-4-5  . Coronary stent placement  1999, 2002  . Doppler  echocardiography  Sept 2011    EF 50-55% with some impaired diastolic relaxation  . Colonoscopy  2006     Multiple polyps removed, repeat in 5 years  . Right iliopopliteal bypass - [place]  . Femoral-popliteal bypass graft  2008    Left  . Cardiac catheterization  October 18, 2010    Known obstructive disease, no further blockages  . Cardiac catheterization  03/17/2005    EF 35-40%  . US echocardiography  03/04/2006    EF 50-55%  . Amputation  03/16/2011    Procedure: AMPUTATION DIGIT;  Surgeon: Angelia Mould, MD;  Location: [facility] OR;  Service: Vascular;  Laterality: Left;  Great toe  . Hip arthroplasty Left     VITAL SIGNS BP 138/79 mmHg  Pulse 79  Ht 5\' 8"  (1.727 m)  Wt 252 lb (114.306 kg)  BMI 38.33 kg/m2   Outpatient Encounter Prescriptions as of 09/18/2014  Medication Sig  . acetaminophen (TYLENOL) 650 MG CR tablet Take 650 mg by mouth every 8 (eight) hours as needed for pain.  Marland Kitchen acetaminophen-codeine (TYLENOL #4) 300-60 MG per tablet Take 1 tablets by mouth every 8 (eight) hours as  needed for moderate pain.  . bisoprolol (ZEBETA) 5 MG tablet Take 0.5 tablets (2.5 mg total) by mouth daily.  . insulin aspart (NOVOLOG) 100 UNIT/ML injection  Inject 5 Units into the skin 3 (three) times daily with meals. For cbg >=150)  . insulin NPH Human (HUMULIN N,NOVOLIN N) 100 UNIT/ML injection Inject 0.15 mLs (15 Units total) into the skin 2 (two) times daily before a meal.  . lidocaine (LIDODERM) 5 % Place 1 patch onto the skin daily. Remove & Discard patch within 12 hours or as directed by MD  . lisinopril (PRINIVIL,ZESTRIL) 2.5 MG tablet Take 2.5 mg by mouth daily.   Marland Kitchen LORazepam (ATIVAN) 0.5 MG tablet Take 0.5 tablets (0.25 mg total) by mouth at bedtime.  . meclizine (ANTIVERT) 25 MG tablet Take 1 tablet (25 mg total) by mouth 3 (three) times daily as needed for dizziness.  . mirtazapine (REMERON) 15 MG tablet Take 1 tablet (15 mg total) by mouth at bedtime.  . nitroGLYCERIN  (NITROSTAT) 0.4 MG SL tablet  Place 0.4 mg under the tongue as needed for chest pain. )  . omeprazole (PRILOSEC) 40 MG capsule Take 1 capsule (40 mg total) by mouth daily.  . simvastatin (ZOCOR) 80 MG tablet  Take 80 mg by mouth daily at 6 PM. )  . tamsulosin (FLOMAX) 0.4 MG CAPS capsule Take 1 capsule (0.4 mg total) by mouth daily after supper.  . torsemide (DEMADEX) 20 MG tablet  Take 20 mg by mouth daily. )  . venlafaxine XR (EFFEXOR-XR) 37.5 MG 24 hr capsule : Take 37.5 mg by mouth daily. )  . warfarin (COUMADIN) 5 MG tablet Take 1 tablet (5 mg total) by mouth daily at 6 PM.      SIGNIFICANT DIAGNOSTIC EXAMS  09-04-14: bilateral hip and pelvic x-ray: Left hip arthroplasty with mild acetabular protrusio. No fracture or dislocation is seen. No interval change.  09-04-14: right femur x-ray: No fracture or dislocation is seen.   09-04-14: left femur x-ray: No acute abnormality.  09-13-14: left hip and pelvic x-ray: No acute bony abnormality.   09-13-14: right shoulder x-ray: Advanced degenerative change in the shoulder joint. Negative for fracture.    LABS REVIEWED:   05-10-14: hgb a1c 7.4 09-13-14: wbc 10.4; hgb 13.3; hct 41.7; mcv 81.8; plt 188; glucose 226; bun 23; creat 1.12; k+4.2; na++138  09-18-14: inr 2.55: coumadin 5 mg daily     Review of Systems  Constitutional: Negative for malaise/fatigue.  Respiratory: Negative for cough and shortness of breath.   Cardiovascular: Negative for chest pain, palpitations and leg swelling.  Gastrointestinal: Negative for heartburn, abdominal pain and constipation.  Musculoskeletal: Positive for myalgias and joint pain.  Skin: Negative.   Neurological: Negative for headaches.  Psychiatric/Behavioral: Negative for depression. The patient is not nervous/anxious.      Physical Exam  Constitutional: He is oriented to person, place, and time. No distress.  Obese   Neck: Neck supple. No JVD present. No thyromegaly present.  Cardiovascular:  Normal rate, regular rhythm and intact distal pulses.   Respiratory: Effort normal and breath sounds normal. No respiratory distress.  GI: Soft. Bowel sounds are normal. He exhibits no distension. There is no tenderness.  Musculoskeletal:  Is able to move all extremities Trace lower extremity edema   Neurological: He is alert and oriented to person, place, and time.  Skin: Skin is warm and dry. He is not diaphoretic.  Lower extremities discolored   Psychiatric: He has a normal mood and affect.  ASSESSMENT/ PLAN:  1. Chronic systolic heart failure: will conitnue demadex 20 mg daily will monitor   2. Hypertension: will continue zebeta 2.5 mg daily; lisinopril 2.5 mg daily and will monitor  3. Diabetes: his hgb a1c is 7.4; will continue novolog 5 units prior to meals for cbg >=150; will continue NPH 15 units twice daily with meals;   4. CAD: status post cabg and stents; no complaints of chest pain present; will continue zebeta 2.5 mg daily; and prn ntg  5. Gerd: continue prilosec 40 mg daily   6. CVA: is neurologically stable; is on long term coumadin therapy   7. PE/inr management: he is presently stable; on long term coumadin therapy; for his inr of 2.55 will continue coumadin 5 mg daily and will check inr in one week   8. Dyslipidemia: will continue zocor 80 mg daily   9. BPH: will continue flomax 0.4 mg daily   10. Osteoarthritis: has pain in numerous joints; will continue lidoderm patch as directed; will continue tylenol #4 every 8 hours as needed;   11. Anxiety: will continue effextor xr 37.5 mg daily; and ativan 0.25 mg nightly and will monitor  12. physical deconditioning: will continue therapy as indicated to improve upon strength; gait; mobility and adl retraining  Time spent with patient 50 minutes.       Ok Edwards NP [facility]  Contact [phone] Monday through Friday 8am- 5pm  After hours call [phone]

## 2014-09-21 NOTE — Patient Outreach (Signed)
[facility]) Care Management  09/21/2014  [name] 08-31-1940 [id number]   Referral received from patients primary MD. Per EPIC note [name], social worker with [facility] care management is involved with patient.  Patient has is presently at [facility] center [place].  [name] will follow patient.  No contact needed to patient from this RNCM at this time.  Quinn Plowman RN,BSN,CCM [place] Coordinator [phone]

## 2014-09-28 ENCOUNTER — Encounter: Payer: Self-pay | Admitting: *Deleted

## 2014-09-28 ENCOUNTER — Other Ambulatory Visit: Payer: Self-pay | Admitting: *Deleted

## 2014-09-28 ENCOUNTER — Ambulatory Visit: Payer: Medicare Other | Admitting: *Deleted

## 2014-09-28 NOTE — Patient Outreach (Signed)
[facility]) Care Management  [facility] Social Work  09/28/2014  [name] [birth year] [id number]   Current Medications:  Current Outpatient Prescriptions  Medication Sig Dispense Refill  . acetaminophen (TYLENOL) 650 MG CR tablet Take 650 mg by mouth every 8 (eight) hours as needed for pain.    Marland Kitchen acetaminophen-codeine (TYLENOL #4) 300-60 MG per tablet Take 1-2 tablets by mouth every 8 (eight) hours as needed for moderate pain. 60 tablet 2  . bisoprolol (ZEBETA) 5 MG tablet Take 0.5 tablets (2.5 mg total) by mouth daily. 30 tablet 0  . Blood Glucose Monitoring Suppl (ONE TOUCH ULTRA 2) W/DEVICE KIT Please supply patient with 1 glucometer device to use 4 times daily to check blood sugars.  Dx code: 250.0 1 each 1  . glucose blood test strip Use as instructed.  One Step Ultra 2 strips. 100 each 12  . insulin aspart (NOVOLOG) 100 UNIT/ML injection Use sliding scale as prescribed:  0 - 150 0 units; 151 - 200 2 units, 201 - 250 4 units; 251 - 200 6 units, 301 - 350 8 units (Patient taking differently: Inject 5 Units into the skin 3 (three) times daily with meals. For cbg >=150) 10 mL 11  . insulin NPH Human (HUMULIN N,NOVOLIN N) 100 UNIT/ML injection Inject 0.15 mLs (15 Units total) into the skin 2 (two) times daily before a meal. 10 mL 11  . Insulin Syringe-Needle U-100 (B-D INS SYRINGE 0.5CC/30GX1/2") 30G X 1/2" 0.5 ML MISC For use with insulin 100 each 0  . lidocaine (LIDODERM) 5 % Place 1 patch onto the skin daily. Remove & Discard patch within 12 hours or as directed by MD 30 patch 0  . lisinopril (PRINIVIL,ZESTRIL) 2.5 MG tablet Take 2.5 mg by mouth daily.     Marland Kitchen LORazepam (ATIVAN) 0.5 MG tablet Take 0.5 tablets (0.25 mg total) by mouth at bedtime. 30 tablet 1  . meclizine (ANTIVERT) 25 MG tablet Take 1 tablet (25 mg total) by mouth 3 (three) times daily as needed for dizziness. 45 tablet 1  . mirtazapine (REMERON) 15 MG tablet Take 1 tablet (15 mg total) by mouth at bedtime.  30 tablet 6  . NEEDLE, DISP, 30 G (B-D DISP NEEDLE 30GX1") 30G X 1" MISC For using with insulin. 100 each 0  . nitroGLYCERIN (NITROSTAT) 0.4 MG SL tablet Place 1 tablet (0.4 mg total) under the tongue as needed. For chest pains (Patient taking differently: Place 0.4 mg under the tongue as needed for chest pain. ) 30 tablet 6  . omeprazole (PRILOSEC) 40 MG capsule Take 1 capsule (40 mg total) by mouth daily. 30 capsule 3  . simvastatin (ZOCOR) 80 MG tablet Take 1 tablet (80 mg total) by mouth daily. (Patient taking differently: Take 80 mg by mouth daily at 6 PM. ) 30 tablet 6  . tamsulosin (FLOMAX) 0.4 MG CAPS capsule Take 1 capsule (0.4 mg total) by mouth daily after supper. 90 capsule 1  . torsemide (DEMADEX) 20 MG tablet TAKE ONE TABLET BY MOUTH EVERY DAY (Patient taking differently: Take 20 mg by mouth daily. ) 60 tablet 2  . venlafaxine XR (EFFEXOR-XR) 37.5 MG 24 hr capsule TAKE ONE CAPSULE BY MOUTH ONCE DAILY (Patient taking differently: Take 37.5 mg by mouth daily. ) 30 capsule 11  . warfarin (COUMADIN) 5 MG tablet Take 1 tablet (5 mg total) by mouth daily at 6 PM. 90 tablet 3   No current facility-administered medications for this visit.  Functional Status:  In your present state of health, do you have any difficulty performing the following activities: 09/28/2014 07/21/2014  Hearing? N Y  Vision? N N  Difficulty concentrating or making decisions? N N  Walking or climbing stairs? Y N  Dressing or bathing? Y N  Doing errands, shopping? Y N  Preparing Food and eating ? Y -  Using the Toilet? Y -  In the past six months, have you accidently leaked urine? N -  Do you have problems with loss of bowel control? N -  Managing your Medications? Y -  Managing your Finances? Y -  Housekeeping or managing your Housekeeping? Y -    Fall/Depression Screening:  PHQ 2/9 Scores 09/28/2014 08/29/2014 06/06/2014 05/05/2014 04/21/2014 04/12/2014 01/20/2014  PHQ - 2 Score 0 0 0 0 0 0 0  PHQ- 9 Score - - -  - - - -  Exception Documentation Patient refusal - - - - - -    Assessment:   CSW was able to meet with patient today at [facility] [place] where patient currently resides to receive rehabilitative services, to perform the initial visit.  CSW introduced self, explained role and types of services provided through [employer].  CSW then obtained verbal consent from patient to speak with his wife, [name] to discuss discharge planning needs for patient, if necessary.  CSW also obtained two HIPAA compliant identifiers from patient, which included patient's name and date of birth. Patient admitted to [place] that he has fallen "numerous times"over the past year, often several times in one day.  Patient indicated that he is working hard with his physical therapist and occupational therapist at [facility] because his goal is to be able to return home to live with [name] and perform activities of daily living independently.  Patient reported that his son is planning to stay with him and his wife once patient is released from the facility.  CSW obtained verbal consent from patient to speak with the admissions coordinator/licensed clinical social worker at [facility] to discuss discharge planning arrangements for patient. CSW was able to briefly converse with patient's attending nurse at [facility], as admissions coordinator/licensed clinical social worker was unavailable.  Patient's attending nurse reports that patient's goals of care meeting has already been performed and that it has been established that patient will return home to live with home health services and durable medical equipment at time of release from the facility.  CSW was able to leave a HIPAA compliant message for admissions coordinator/licensed clinical social worker to request that she assist patient  with completion of an Adult Medicaid application and submit it to the [place] for processing.  CSW explained that if they are unable to do so, to please alert CSW so that CSW can make arrangements to complete the necessary documents. CSW and patient were unable to identify any additional social work needs at this time; therefore, it was agreed upon that [facility] would perform a case closure on patient.  CSW provided patient with CSW's contact information, encouraging patient to contact CSW directly if social work needs arise in the future.  Patient voiced understanding and was agreeable to this plan.  CSW was also able to converse with Mrs. Crewe who confirms that patient will be returning home to live upon discharge from [facility].  Plan:   CSW will no longer make arrangements to contact patient by phone or in person, as all goals of treatment have been met from social work standpoint and no additional social work needs have been identified at this time. CSW will submit a case closure request to Lurline Del, Care Management Assistant with [facility], in the form of an [employer].   CSW will fax a correspondence letter to patient's Primary Care Physician, Dr. Esmond Camper to report social work involvement with patient.  [name], BSW, MSW, [facility] [place], [place], [place] [address] [email or web address] [phone]

## 2014-10-02 NOTE — Patient Outreach (Signed)
[facility]) Care Management  10/02/2014  [name] 09-22-1940 [id number]   Notification from [name], LCSW to close case a patient states he has no needs for [facility] Care Management.  [name]. [place], [place] [facility] [place] Assistant Phone: [phone] Fax: [phone]

## 2014-10-20 ENCOUNTER — Non-Acute Institutional Stay (SKILLED_NURSING_FACILITY): Payer: Medicare Other | Admitting: Adult Health

## 2014-10-20 DIAGNOSIS — J449 Chronic obstructive pulmonary disease, unspecified: Secondary | ICD-10-CM

## 2014-10-20 DIAGNOSIS — E118 Type 2 diabetes mellitus with unspecified complications: Secondary | ICD-10-CM

## 2014-10-20 DIAGNOSIS — K219 Gastro-esophageal reflux disease without esophagitis: Secondary | ICD-10-CM | POA: Diagnosis not present

## 2014-10-20 DIAGNOSIS — I10 Essential (primary) hypertension: Secondary | ICD-10-CM

## 2014-10-20 DIAGNOSIS — I5022 Chronic systolic (congestive) heart failure: Secondary | ICD-10-CM | POA: Diagnosis not present

## 2014-10-20 DIAGNOSIS — N400 Enlarged prostate without lower urinary tract symptoms: Secondary | ICD-10-CM | POA: Diagnosis not present

## 2014-10-20 DIAGNOSIS — E7800 Pure hypercholesterolemia, unspecified: Secondary | ICD-10-CM

## 2014-10-20 DIAGNOSIS — I25810 Atherosclerosis of coronary artery bypass graft(s) without angina pectoris: Secondary | ICD-10-CM | POA: Diagnosis not present

## 2014-10-23 ENCOUNTER — Other Ambulatory Visit: Payer: Self-pay | Admitting: *Deleted

## 2014-10-23 MED ORDER — HYDROCODONE-ACETAMINOPHEN 7.5-325 MG PO TABS: ORAL_TABLET | ORAL | Status: DC

## 2014-10-23 NOTE — Telephone Encounter (Signed)
[facility] 

## 2014-12-01 ENCOUNTER — Non-Acute Institutional Stay (SKILLED_NURSING_FACILITY): Payer: Medicare Other | Admitting: Adult Health

## 2014-12-01 DIAGNOSIS — K219 Gastro-esophageal reflux disease without esophagitis: Secondary | ICD-10-CM | POA: Diagnosis not present

## 2014-12-01 DIAGNOSIS — I10 Essential (primary) hypertension: Secondary | ICD-10-CM

## 2014-12-01 DIAGNOSIS — J449 Chronic obstructive pulmonary disease, unspecified: Secondary | ICD-10-CM

## 2014-12-01 DIAGNOSIS — I5022 Chronic systolic (congestive) heart failure: Secondary | ICD-10-CM | POA: Diagnosis not present

## 2014-12-01 DIAGNOSIS — I25810 Atherosclerosis of coronary artery bypass graft(s) without angina pectoris: Secondary | ICD-10-CM | POA: Diagnosis not present

## 2014-12-01 DIAGNOSIS — E118 Type 2 diabetes mellitus with unspecified complications: Secondary | ICD-10-CM | POA: Diagnosis not present

## 2014-12-01 DIAGNOSIS — N400 Enlarged prostate without lower urinary tract symptoms: Secondary | ICD-10-CM | POA: Diagnosis not present

## 2014-12-20 ENCOUNTER — Other Ambulatory Visit: Payer: Self-pay

## 2014-12-20 MED ORDER — HYDROCODONE-ACETAMINOPHEN 7.5-325 MG PO TABS: ORAL_TABLET | ORAL | Status: DC

## 2014-12-20 NOTE — Telephone Encounter (Signed)
RX faxed to [facility] @ [phone], phone number [phone] 

## 2014-12-22 ENCOUNTER — Non-Acute Institutional Stay (SKILLED_NURSING_FACILITY): Payer: Medicare Other | Admitting: Adult Health

## 2014-12-22 DIAGNOSIS — M79641 Pain in right hand: Secondary | ICD-10-CM | POA: Diagnosis not present

## 2014-12-22 DIAGNOSIS — M25531 Pain in right wrist: Secondary | ICD-10-CM | POA: Diagnosis not present

## 2014-12-25 ENCOUNTER — Non-Acute Institutional Stay (SKILLED_NURSING_FACILITY): Payer: Medicare Other | Admitting: Adult Health

## 2014-12-25 DIAGNOSIS — K219 Gastro-esophageal reflux disease without esophagitis: Secondary | ICD-10-CM

## 2014-12-25 DIAGNOSIS — E1169 Type 2 diabetes mellitus with other specified complication: Secondary | ICD-10-CM

## 2014-12-25 DIAGNOSIS — E1149 Type 2 diabetes mellitus with other diabetic neurological complication: Secondary | ICD-10-CM

## 2014-12-25 DIAGNOSIS — F32A Depression, unspecified: Secondary | ICD-10-CM

## 2014-12-25 DIAGNOSIS — J449 Chronic obstructive pulmonary disease, unspecified: Secondary | ICD-10-CM

## 2014-12-25 DIAGNOSIS — F329 Major depressive disorder, single episode, unspecified: Secondary | ICD-10-CM

## 2014-12-25 DIAGNOSIS — M159 Polyosteoarthritis, unspecified: Secondary | ICD-10-CM

## 2014-12-25 DIAGNOSIS — M150 Primary generalized (osteo)arthritis: Secondary | ICD-10-CM | POA: Diagnosis not present

## 2014-12-25 DIAGNOSIS — E785 Hyperlipidemia, unspecified: Secondary | ICD-10-CM | POA: Diagnosis not present

## 2014-12-25 DIAGNOSIS — Z7901 Long term (current) use of anticoagulants: Secondary | ICD-10-CM

## 2014-12-25 DIAGNOSIS — I5022 Chronic systolic (congestive) heart failure: Secondary | ICD-10-CM | POA: Diagnosis not present

## 2014-12-25 DIAGNOSIS — I10 Essential (primary) hypertension: Secondary | ICD-10-CM

## 2015-01-21 ENCOUNTER — Encounter: Payer: Self-pay | Admitting: Adult Health

## 2015-01-21 NOTE — Progress Notes (Signed)
Patient ID: [name], male   DOB: 1940-10-22, 74 y.o.   MRN: [id number]   Facility: [facility]      Allergies  Allergen Reactions  . Vancomycin Other (See Comments)    "red man syndrome"  . Diazepam Anxiety    REACTION: makes patient cry    Chief Complaint  Patient presents with  . Medical Management of Chronic Issues    HPI:  He is a resident of this facility being seen for the management of his chronic illnesses. He continues to have generalized joint pain; which is not being relieved with his current regimen. His pain is worse with therapy and at night. We discussed his regimen and will make changes.    Past Medical History  Diagnosis Date  . Depression   . Diabetes mellitus   . COPD (chronic obstructive pulmonary disease) ([place])   . Adhesive capsulitis   . MRSA bacteremia     2011 - possible endocarditis, received 6 weeks IV treatment  . Hyperlipidemia   . Anxiety   . GERD (gastroesophageal reflux disease)   . CAD (coronary artery disease)   . 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 CVA (cerebral infarction) Questionable history  . Diverticulosis   . Osteomyelitis ([place]) 2012    Sternoclavicular joint   . Chronic back pain   . Angina   . Myocardial infarction ([place])   . CHF (congestive heart failure) ([place])   . Shortness of breath   . Sleep apnea   . Chronic kidney disease     hx of BPH  . Neuromuscular disorder ([place])     HX of diabetic periferal neuropathy  . Hypertension   . Peripheral vascular disease ([place])   . On home oxygen therapy     "1.5L prn" (04/12/2014)  . Falls frequently 06/2014    Past Surgical History  Procedure Laterality Date  . Septic arthritis      Removal of infected CABG wire by Dr Arlyce Dice - 2011  . Coronary artery bypass graft  1992  . Fracture surgery  1980s    Hip  . Spine surgery  2002    Cervical fusion vertebroplasty C3-4-5  . Coronary stent placement  1999, 2002  . Doppler  echocardiography  Sept 2011    EF 50-55% with some impaired diastolic relaxation  . Colonoscopy  2006     Multiple polyps removed, repeat in 5 years  . Right iliopopliteal bypass - [place]  . Femoral-popliteal bypass graft  2008    Left  . Cardiac catheterization  October 18, 2010    Known obstructive disease, no further blockages  . Cardiac catheterization  03/17/2005    EF 35-40%  . US echocardiography  03/04/2006    EF 50-55%  . Amputation  03/16/2011    Procedure: AMPUTATION DIGIT;  Surgeon: Angelia Mould, MD;  Location: [facility] OR;  Service: Vascular;  Laterality: Left;  Great toe  . Hip arthroplasty Left     VITAL SIGNS BP 110/52 mmHg  Pulse 74  Ht 5\' 8"  (1.727 m)  Wt 256 lb (116.121 kg)  BMI 38.93 kg/m2  Patient's Medications  New Prescriptions   No medications on file  Previous Medications   ACETAMINOPHEN-CODEINE (TYLENOL #4) 300-60 MG PER TABLET    Take 1 tablets by mouth every 8 (eight) hours as needed for moderate pain.   BISOPROLOL (ZEBETA) 5 MG TABLET    Take 0.5 tablets (2.5 mg  total) by mouth daily.   INSULIN ASPART (NOVOLOG) 100 UNIT/ML INJECTION 5 units with meals for cbg >=150   INSULIN NPH HUMAN (HUMULIN N,NOVOLIN N) 100 UNIT/ML INJECTION    Inject 0.15 mLs (15 Units total) into the skin 2 (two) times daily before a meal.   LIDOCAINE (LIDODERM) 5 %    Place 1 patch onto the skin daily. Remove & Discard patch within 12 hours or as directed by MD   LISINOPRIL (PRINIVIL,ZESTRIL) 2.5 MG TABLET    Take 5 mg by mouth daily.    LORAZEPAM (ATIVAN) 0.5 MG TABLET    Take 0.5 tablets (0.25 mg total) by mouth at bedtime.   MECLIZINE (ANTIVERT) 25 MG TABLET    Take 1 tablet (25 mg total) by mouth 3 (three) times daily as needed for dizziness.   MIRTAZAPINE (REMERON) 15 MG TABLET    Take 1 tablet (15 mg total) by mouth at bedtime.   NITROGLYCERIN (NITROSTAT) 0.4 MG SL TABLET    Place 1 tablet (0.4 mg total) under the tongue as needed. For chest pains   OMEPRAZOLE  (PRILOSEC) 40 MG CAPSULE    Take 1 capsule (40 mg total) by mouth daily.   SIMVASTATIN (ZOCOR) 80 MG TABLET    Take 1 tablet (80 mg total) by mouth daily.   TAMSULOSIN (FLOMAX) 0.4 MG CAPS CAPSULE    Take 1 capsule (0.4 mg total) by mouth daily after supper.   TORSEMIDE (DEMADEX) 20 MG TABLET    TAKE ONE TABLET BY MOUTH EVERY DAY   VENLAFAXINE XR (EFFEXOR-XR) 37.5 MG 24 HR CAPSULE    TAKE ONE CAPSULE BY MOUTH ONCE DAILY   WARFARIN (COUMADIN) 5 MG TABLET    Take 4mg  by mouth daily at 6 PM.  Modified Medications   No medications on file  Discontinued Medications     SIGNIFICANT DIAGNOSTIC EXAMS  09-04-14: bilateral hip and pelvic x-ray: Left hip arthroplasty with mild acetabular protrusio. No fracture or dislocation is seen. No interval change.  09-04-14: right femur x-ray: No fracture or dislocation is seen.   09-04-14: left femur x-ray: No acute abnormality.  09-13-14: left hip and pelvic x-ray: No acute bony abnormality.   09-13-14: right shoulder x-ray: Advanced degenerative change in the shoulder joint. Negative for fracture.    LABS REVIEWED:   05-10-14: hgb a1c 7.4 09-13-14: wbc 10.4; hgb 13.3; hct 41.7; mcv 81.8; plt 188; glucose 226; bun 23; creat 1.12; k+4.2; na++138  09-18-14: inr 2.55: coumadin 5 mg daily      Review of Systems Constitutional: Negative for malaise/fatigue.  Respiratory: Negative for cough and shortness of breath.   Cardiovascular: Negative for chest pain, palpitations and leg swelling.  Gastrointestinal: Negative for heartburn, abdominal pain and constipation.  Musculoskeletal: Positive for myalgias and joint pain.  Skin: Negative.   Neurological: Negative for headaches.  Psychiatric/Behavioral: Negative for depression. The patient is not nervous/anxious   Physical Exam Constitutional: He is oriented to person, place, and time. No distress.  Obese   Neck: Neck supple. No JVD present. No thyromegaly present.  Cardiovascular: Normal rate, regular  rhythm and intact distal pulses.   Respiratory: Effort normal and breath sounds normal. No respiratory distress.  GI: Soft. Bowel sounds are normal. He exhibits no distension. There is no tenderness.  Musculoskeletal:  Is able to move all extremities Trace lower extremity edema   Neurological: He is alert and oriented to person, place, and time.  Skin: Skin is warm and dry. He is not diaphoretic.  Lower extremities discolored   Psychiatric: He has a normal mood and affect.       ASSESSMENT/ PLAN:  1. Chronic systolic heart failure: will conitnue demadex 20 mg daily will monitor   2. Hypertension: will continue zebeta 2.5 mg daily; lisinopril 2.5 mg daily and will monitor  3. Diabetes: his hgb a1c is 7.4; will continue novolog 5 units prior to meals for cbg >=150; will increase  NPH  To 18 units twice daily with meals;   4. CAD: status post cabg and stents; no complaints of chest pain present; will continue zebeta 2.5 mg daily; and prn ntg  5. Gerd: continue prilosec 40 mg daily   6. CVA: is neurologically stable; is on long term coumadin therapy   7. PE/inr management: he is presently stable; on long term coumadin therapy;  will continue coumadin 4 mg daily inr due 10-24-14   8. Dyslipidemia: will continue zocor 80 mg daily   9. BPH: will continue flomax 0.4 mg daily   10. Osteoarthritis: has pain in numerous joints; will continue lidoderm patch as directed; will will stop the tylenol #3 and will begin vicodin 7.5/325 mg prior to therapy and at night with tid prn.   11. Anxiety: will continue effextor xr 37.5 mg daily; and ativan 0.25 mg nightly and will monitor    Ok Edwards NP [facility]  Contact [phone] Monday through Friday 8am- 5pm  After hours call [phone]

## 2015-01-25 ENCOUNTER — Encounter: Payer: Self-pay | Admitting: Adult Health

## 2015-01-25 NOTE — Progress Notes (Signed)
Patient ID: [name], male   DOB: 07-07-1940, 74 y.o.   MRN: [id number]   Facility: [facility]      Allergies  Allergen Reactions  . Vancomycin Other (See Comments)    "red man syndrome"  . Diazepam Anxiety    REACTION: makes patient cry    Chief Complaint  Patient presents with  . Medical Management of Chronic Issues    HPI:  He is a long term resident of this facility being seen for the management of his chronic illnesses. He is complaining of leg pain at night; and is not sleeping at night. There are no nursing concerns at this time. There is no significant change at this time.    Past Medical History  Diagnosis Date  . Depression   . Diabetes mellitus   . COPD (chronic obstructive pulmonary disease) ([place])   . Adhesive capsulitis   . MRSA bacteremia     2011 - possible endocarditis, received 6 weeks IV treatment  . Hyperlipidemia   . Anxiety   . GERD (gastroesophageal reflux disease)   . CAD (coronary artery disease)   . 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 CVA (cerebral infarction) Questionable history  . Diverticulosis   . Osteomyelitis ([place]) 2012    Sternoclavicular joint   . Chronic back pain   . Angina   . Myocardial infarction ([place])   . CHF (congestive heart failure) ([place])   . Shortness of breath   . Sleep apnea   . Chronic kidney disease     hx of BPH  . Neuromuscular disorder ([place])     HX of diabetic periferal neuropathy  . Hypertension   . Peripheral vascular disease ([place])   . On home oxygen therapy     "1.5L prn" (04/12/2014)  . Falls frequently 06/2014    Past Surgical History  Procedure Laterality Date  . Septic arthritis      Removal of infected CABG wire by Dr Arlyce Dice - 2011  . Coronary artery bypass graft  1992  . Fracture surgery  1980s    Hip  . Spine surgery  2002    Cervical fusion vertebroplasty C3-4-5  . Coronary stent placement  1999, 2002  . Doppler echocardiography  Sept 2011      EF 50-55% with some impaired diastolic relaxation  . Colonoscopy  2006     Multiple polyps removed, repeat in 5 years  . Right iliopopliteal bypass - [place]  . Femoral-popliteal bypass graft  2008    Left  . Cardiac catheterization  October 18, 2010    Known obstructive disease, no further blockages  . Cardiac catheterization  03/17/2005    EF 35-40%  . US echocardiography  03/04/2006    EF 50-55%  . Amputation  03/16/2011    Procedure: AMPUTATION DIGIT;  Surgeon: Angelia Mould, MD;  Location: [facility] OR;  Service: Vascular;  Laterality: Left;  Great toe  . Hip arthroplasty Left     VITAL SIGNS BP 110/62 mmHg  Pulse 78  Ht 5\' 8"  (1.727 m)  Wt 268 lb (121.564 kg)  BMI 40.76 kg/m2  Patient's Medications  New Prescriptions   No medications on file  Previous Medications   ACETAMINOPHEN-CODEINE (TYLENOL #4) 300-60 MG PER TABLET    Take 1-2 tablets by mouth every 8 (eight) hours as needed for moderate pain.   BISOPROLOL (ZEBETA) 5 MG TABLET    Take 0.5 tablets (2.5 mg  total) by mouth daily.   HYDROCODONE-ACETAMINOPHEN (NORCO) 7.5-325 MG TABLET Take 1 nightly and every 8 hours as needed    INSULIN ASPART (NOVOLOG) 100 UNIT/ML INJECTION    5 units with meals with an additional 5 units for cbg >=150   INSULIN NPH HUMAN (HUMULIN N,NOVOLIN N) 100 UNIT/ML INJECTION    Inject  18 Units total into the skin 2 (two) times daily before a meal.   LIDOCAINE (LIDODERM) 5 %    Place 1 patch onto the skin daily. Remove & Discard patch within 12 hours or as directed by MD   LISINOPRIL (PRINIVIL,ZESTRIL) 2.5 MG TABLET    Take 5 mg by mouth daily.    LORAZEPAM (ATIVAN) 0.5 MG TABLET    Take 0.5 tablets (0.25 mg total) by mouth at bedtime.   MECLIZINE (ANTIVERT) 25 MG TABLET    Take 1 tablet (25 mg total) by mouth 3 (three) times daily as needed for dizziness.   MIRTAZAPINE (REMERON) 15 MG TABLET    Take 1 tablet (15 mg total) by mouth at bedtime.   NITROGLYCERIN (NITROSTAT) 0.4 MG SL TABLET     Place 1 tablet (0.4 mg total) under the tongue as needed. For chest pains   OMEPRAZOLE (PRILOSEC) 40 MG CAPSULE    Take 1 capsule (40 mg total) by mouth daily.   SIMVASTATIN (ZOCOR) 80 MG TABLET    Take 1 tablet (80 mg total) by mouth daily.   TAMSULOSIN (FLOMAX) 0.4 MG CAPS CAPSULE    Take 1 capsule (0.4 mg total) by mouth daily after supper.   TORSEMIDE (DEMADEX) 20 MG TABLET    TAKE ONE TABLET BY MOUTH EVERY DAY   VENLAFAXINE XR (EFFEXOR-XR) 37.5 MG 24 HR CAPSULE    TAKE ONE CAPSULE BY MOUTH ONCE DAILY   WARFARIN (COUMADIN) 5 MG TABLET    Take 1 tablet (5 mg total) by mouth daily at 6 PM.  Modified Medications   No medications on file  Discontinued Medications   No medications on file     SIGNIFICANT DIAGNOSTIC EXAMS   09-04-14: bilateral hip and pelvic x-ray: Left hip arthroplasty with mild acetabular protrusio. No fracture or dislocation is seen. No interval change.  09-04-14: right femur x-ray: No fracture or dislocation is seen.   09-04-14: left femur x-ray: No acute abnormality.  09-13-14: left hip and pelvic x-ray: No acute bony abnormality.   09-13-14: right shoulder x-ray: Advanced degenerative change in the shoulder joint. Negative for fracture.    LABS REVIEWED:   05-10-14: hgb a1c 7.4 09-13-14: wbc 10.4; hgb 13.3; hct 41.7; mcv 81.8; plt 188; glucose 226; bun 23; creat 1.12; k+4.2; na++138  09-18-14: inr 2.55: coumadin 5 mg daily  11-06-14: inr 1.9      Review of Systems Constitutional: Negative for malaise/fatigue.  Respiratory: Negative for cough and shortness of breath.   Cardiovascular: Negative for chest pain, palpitations and leg swelling.  Gastrointestinal: Negative for heartburn, abdominal pain and constipation.  Musculoskeletal: Positive for myalgias and joint pain.  Skin: Negative.   Neurological: Negative for headaches.  Psychiatric/Behavioral: Negative for depression. The patient is not nervous/anxious    Physical Exam Constitutional: He is  oriented to person, place, and time. No distress.  Obese   Neck: Neck supple. No JVD present. No thyromegaly present.  Cardiovascular: Normal rate, regular rhythm and intact distal pulses.   Respiratory: Effort normal and breath sounds normal. No respiratory distress.  GI: Soft. Bowel sounds are normal. He exhibits no distension. There is  no tenderness.  Musculoskeletal:  Is able to move all extremities Trace lower extremity edema   Neurological: He is alert and oriented to person, place, and time.  Skin: Skin is warm and dry. He is not diaphoretic.  Lower extremities discolored   Psychiatric: He has a normal mood and affect.       ASSESSMENT/ PLAN:  1. Chronic systolic heart failure: will conitnue demadex 20 mg daily will monitor   2. Hypertension: will continue zebeta 2.5 mg daily; lisinopril 2.5 mg daily and will monitor  3. Diabetes: his hgb a1c is 7.4; will continue novolog 5 units prior to meals with an additional 5 units  for cbg >=150; will continue  NPH   18 units twice daily with meals;   4. CAD: status post cabg and stents; no complaints of chest pain present; will continue zebeta 2.5 mg daily; and prn ntg  5. Gerd: continue prilosec 40 mg daily   6. CVA: is neurologically stable; is on long term coumadin therapy   7. PE/inr management: he is presently stable; on long term coumadin therapy;  will continue coumadin 4.5  mg daily   8. Dyslipidemia: will continue zocor 80 mg daily   9. BPH: will continue flomax 0.4 mg daily   10. Osteoarthritis: has pain in numerous joints; will continue lidoderm patch as directed; will continue  vicodin 7.5/325 mg prior to therapy and at night with tid prn.   11. Anxiety: will continue effextor xr 37.5 mg daily; and ativan 0.25 mg nightly and will monitor  Will begin melatonin 3 mg nightly for sleep   Will check cbc; cmp; lipids hgb a1c     Ok Edwards NP [facility]  Contact [phone] Monday through Friday  8am- 5pm  After hours call [phone]

## 2015-02-06 ENCOUNTER — Non-Acute Institutional Stay (SKILLED_NURSING_FACILITY): Payer: Medicare Other | Admitting: Internal Medicine

## 2015-02-06 DIAGNOSIS — T45511A Poisoning by anticoagulants, accidental (unintentional), initial encounter: Secondary | ICD-10-CM

## 2015-02-06 DIAGNOSIS — D689 Coagulation defect, unspecified: Secondary | ICD-10-CM | POA: Diagnosis not present

## 2015-02-06 DIAGNOSIS — F32A Depression, unspecified: Secondary | ICD-10-CM

## 2015-02-06 DIAGNOSIS — I739 Peripheral vascular disease, unspecified: Secondary | ICD-10-CM | POA: Diagnosis not present

## 2015-02-06 DIAGNOSIS — M150 Primary generalized (osteo)arthritis: Secondary | ICD-10-CM | POA: Diagnosis not present

## 2015-02-06 DIAGNOSIS — D6832 Hemorrhagic disorder due to extrinsic circulating anticoagulants: Secondary | ICD-10-CM

## 2015-02-06 DIAGNOSIS — J449 Chronic obstructive pulmonary disease, unspecified: Secondary | ICD-10-CM

## 2015-02-06 DIAGNOSIS — F329 Major depressive disorder, single episode, unspecified: Secondary | ICD-10-CM

## 2015-02-06 DIAGNOSIS — E1149 Type 2 diabetes mellitus with other diabetic neurological complication: Secondary | ICD-10-CM | POA: Diagnosis not present

## 2015-02-06 DIAGNOSIS — I10 Essential (primary) hypertension: Secondary | ICD-10-CM | POA: Diagnosis not present

## 2015-02-06 DIAGNOSIS — M159 Polyosteoarthritis, unspecified: Secondary | ICD-10-CM

## 2015-02-06 DIAGNOSIS — K219 Gastro-esophageal reflux disease without esophagitis: Secondary | ICD-10-CM | POA: Diagnosis not present

## 2015-02-06 DIAGNOSIS — T45515A Adverse effect of anticoagulants, initial encounter: Principal | ICD-10-CM

## 2015-02-06 DIAGNOSIS — I5022 Chronic systolic (congestive) heart failure: Secondary | ICD-10-CM | POA: Diagnosis not present

## 2015-02-07 ENCOUNTER — Encounter: Payer: Self-pay | Admitting: Internal Medicine

## 2015-02-07 NOTE — Progress Notes (Signed)
Patient ID: [name], male   DOB: February 15, 1941, 74 y.o.   MRN: [id number]    DATE: 02/06/15  Location:  [facility]    Place of Service: [phone])   Extended Emergency Contact Information Primary Emergency Contact: [name], [name] Address: [place], [place] Mobile Phone: [phone] Relation: Spouse  Advanced Directive information  DNR  Chief Complaint  Patient presents with  . Medical Management of Chronic Issues    HPI: 74 yo male short term rehab resident seen today for f/u. He c/o right clavicular pain > back pain. No recent falls. No nursing issues. He is a poor historian due to pysch d/o. Hx obtained from chart  Depression/anxiety - takes lorazepam, remeron and effexor er  DM - on humulin N and novolog. CBG 178 today  Hx PE/PAD - on coumadin. INR 4.67 today and continues to fluctuate. No bleeding or easy bruising  CAD/CHF - stable on bisoprolol, lisinopril, zocor, torsemide. Has not needed SLNTG  COPD - stable and no recent exacerbations  Hyperlipidemia - stable on statin  Chronic back pain - stable on lidoderm and tylenol w codeine  GERD - controlled with omeprazole  BPH - stable on flomax  Past Medical History  Diagnosis Date  . Depression   . Diabetes mellitus   . COPD (chronic obstructive pulmonary disease) ([place])   . Adhesive capsulitis   . MRSA bacteremia     2011 - possible endocarditis, received 6 weeks IV treatment  . Hyperlipidemia   . Anxiety   . GERD (gastroesophageal reflux disease)   . CAD (coronary artery disease)   . 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 CVA (cerebral infarction) Questionable history  . Diverticulosis   . Osteomyelitis ([place]) 2012    Sternoclavicular joint   . Chronic back pain   . Angina   . Myocardial infarction ([place])   . CHF (congestive heart failure) ([place])   . Shortness of breath   . Sleep apnea   . Chronic  kidney disease     hx of BPH  . Neuromuscular disorder ([place])     HX of diabetic periferal neuropathy  . Hypertension   . Peripheral vascular disease ([place])   . On home oxygen therapy     "1.5L prn" (04/12/2014)  . Falls frequently 06/2014    Past Surgical History  Procedure Laterality Date  . Septic arthritis      Removal of infected CABG wire by Dr [name] - 2011  . Coronary artery bypass graft  1992  . Fracture surgery  1980s    Hip  . Spine surgery  2002    Cervical fusion vertebroplasty C3-4-5  . Coronary stent placement  1999, 2002  . Doppler echocardiography  Sept 2011    EF 50-55% with some impaired diastolic relaxation  . Colonoscopy  2006     Multiple polyps removed, repeat in 5 years  . Right iliopopliteal bypass - [place]  . Femoral-popliteal bypass graft  2008    Left  . Cardiac catheterization  October 18, 2010    Known obstructive disease, no further blockages  . Cardiac catheterization  03/17/2005    EF 35-40%  . US echocardiography  03/04/2006    EF 50-55%  . 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 Hip arthroplasty Left     Patient Care Team: Todd Reasons, MD as PCP - General Todd Coco, MD (Cardiology) Todd Jacks, MD (Ophthalmology)  Social History   Social History  . Marital Status: Married    Spouse Name: [name]  . Number of Children: 3  . Years of Education: 12   Occupational History  . Retired-machinest/welder    Social History Main Topics  . Smoking status: Former Smoker    Quit date: 06/19/2000  . Smokeless tobacco: Never Used  . Alcohol Use: No  . Drug Use: No  . Sexual Activity: No   Other Topics Concern  . Not on file   Social History Narrative   Health Care POA: TBD   Emergency Contact: wife, [name] [phone]   End of Life Plan: pt does not have AD and not interested.   Who lives with you: [name]  and step-daughter-[name]   Any pets: none   Diet: Pt has a varied diet of protein, starch and vegetables.   Exercise: Pt does not have regular exercise routine.   Seatbelts: Pt reports wearing seatbelt when in vehicles.    Hobbies: listening to car races.               reports that he quit smoking about 14 years ago. He has never used smokeless tobacco. He reports that he does not drink alcohol or use illicit drugs.  Immunization History  Administered Date(s) Administered  . Influenza Split 12/12/2010  . Influenza Whole 12/22/2005, 12/14/2007  . Influenza,inj,Quad PF,36+ Mos 01/20/2013, 12/09/2013  . Influenza-Unspecified 12/15/2014  . Pneumococcal Conjugate-13 10/10/2013  . Pneumococcal Polysaccharide-23 11/23/1999, 03/25/2003, 05/25/2011  . Td 10/23/2003  . Tdap 07/17/2014    Allergies  Allergen Reactions  . Vancomycin Other (See Comments)    "red man syndrome"  . Diazepam Anxiety    REACTION: makes patient cry    Medications: Patient's Medications  New Prescriptions   No medications on file  Previous Medications   ACETAMINOPHEN-CODEINE (TYLENOL #4) 300-60 MG PER TABLET    Take 1-2 tablets by mouth every 8 (eight) hours as needed for moderate pain.   BISOPROLOL (ZEBETA) 5 MG TABLET    Take 0.5 tablets (2.5 mg total) by mouth daily.   HYDROCODONE-ACETAMINOPHEN (NORCO) 7.5-325 MG TABLET    Take one tablet by mouth at bedtime and take one tablet by mouth once daily prior to therapy and take one tablet by mouth three times daily as needed for pain. Not to exceed 3015m of APAP form all sources/24hr from all sources   INSULIN ASPART (NOVOLOG) 100 UNIT/ML INJECTION    Use sliding scale as prescribed:  0 - 150 0 units; 151 - 200 2 units, 201 - 250 4 units; 251 - 200 6 units, 301 - 350 8 units   INSULIN NPH HUMAN (HUMULIN N,NOVOLIN N) 100 UNIT/ML INJECTION    Inject 0.15 mLs (15 Units total) into the skin 2 (two) times daily before a meal.   LIDOCAINE (LIDODERM) 5 %    Place 1  patch onto the skin daily. Remove & Discard patch within 12 hours or as directed by MD   LISINOPRIL (PRINIVIL,ZESTRIL) 2.5 MG TABLET    Take 5 mg by mouth daily.    LORAZEPAM (ATIVAN) 0.5 MG TABLET    Take 0.5 tablets (0.25 mg total) by mouth at bedtime.   MECLIZINE (ANTIVERT) 25 MG TABLET    Take 1 tablet (25 mg  total) by mouth 3 (three) times daily as needed for dizziness.   MIRTAZAPINE (REMERON) 15 MG TABLET    Take 1 tablet (15 mg total) by mouth at bedtime.   NITROGLYCERIN (NITROSTAT) 0.4 MG SL TABLET    Place 1 tablet (0.4 mg total) under the tongue as needed. For chest pains   OMEPRAZOLE (PRILOSEC) 40 MG CAPSULE    Take 1 capsule (40 mg total) by mouth daily.   SIMVASTATIN (ZOCOR) 80 MG TABLET    Take 1 tablet (80 mg total) by mouth daily.   TAMSULOSIN (FLOMAX) 0.4 MG CAPS CAPSULE    Take 1 capsule (0.4 mg total) by mouth daily after supper.   TORSEMIDE (DEMADEX) 20 MG TABLET    TAKE ONE TABLET BY MOUTH EVERY DAY   VENLAFAXINE XR (EFFEXOR-XR) 37.5 MG 24 HR CAPSULE    TAKE ONE CAPSULE BY MOUTH ONCE DAILY   WARFARIN (COUMADIN) 5 MG TABLET    Take 1 tablet (5 mg total) by mouth daily at 6 PM.  Modified Medications   No medications on file  Discontinued Medications   No medications on file    Review of Systems  Unable to perform ROS: Psychiatric disorder    Filed Vitals:   02/06/15 2327  BP: 137/62  Pulse: 69  Temp: 98.8 F (37.1 C)  Weight: 272 lb (123.378 kg)   Body mass index is 41.37 kg/(m^2).  Physical Exam  Constitutional: He appears well-developed and well-nourished.  HENT:  Mouth/Throat: Oropharynx is clear and moist.  Eyes: Pupils are equal, round, and reactive to light. No scleral icterus.  Neck: Neck supple. Carotid bruit is not present. No thyromegaly present.  Cardiovascular: Normal rate, regular rhythm and intact distal pulses.  Exam reveals no gallop and no friction rub.   Murmur (1/6 SEM) heard. +1 pitting LE edema with chronic venous stasis changes. No calf  TTP  Pulmonary/Chest: Effort normal and breath sounds normal. He has no wheezes. He has no rales. He exhibits no tenderness.  Abdominal: Soft. Bowel sounds are normal. He exhibits no distension, no abdominal bruit, no pulsatile midline mass and no mass. There is no tenderness. There is no rebound and no guarding.  Musculoskeletal: He exhibits edema and tenderness.  Right clavicular TTP with reduced ROM at right shoulder  Lymphadenopathy:    He has no cervical adenopathy.  Neurological: He is alert.  Skin: Skin is warm and dry. No rash noted.  Psychiatric: He has a normal mood and affect. His behavior is normal.     Labs reviewed: No visits with results within 3 Month(s) from this visit. Latest known visit with results is:  Admission on 09/13/2014, Discharged on 09/13/2014  Component Date Value Ref Range Status  . WBC 09/13/2014 10.4  4.0 - 10.5 K/uL Final  . RBC 09/13/2014 5.10  4.22 - 5.81 MIL/uL Final  . Hemoglobin 09/13/2014 13.3  13.0 - 17.0 g/dL Final  . HCT 09/13/2014 41.7  39.0 - 52.0 % Final  . MCV 09/13/2014 81.8  78.0 - 100.0 fL Final  . MCH 09/13/2014 26.1  26.0 - 34.0 pg Final  . MCHC 09/13/2014 31.9  30.0 - 36.0 g/dL Final  . RDW 09/13/2014 16.2* 11.5 - 15.5 % Final  . Platelets 09/13/2014 188  150 - 400 K/uL Final  . Sodium 09/13/2014 138  135 - 145 mmol/L Final  . Potassium 09/13/2014 4.3  3.5 - 5.1 mmol/L Final  . Chloride 09/13/2014 104  101 - 111 mmol/L Final  . CO2  09/13/2014 26  22 - 32 mmol/L Final  . Glucose, Bld 09/13/2014 226* 65 - 99 mg/dL Final  . BUN 09/13/2014 23* 6 - 20 mg/dL Final  . Creatinine, Ser 09/13/2014 1.12  0.61 - 1.24 mg/dL Final  . Calcium 09/13/2014 8.7* 8.9 - 10.3 mg/dL Final  . GFR calc non Af Amer 09/13/2014 >60  >60 mL/min Final  . GFR calc Af Amer 09/13/2014 >60  >60 mL/min Final   Comment: (NOTE) The eGFR has been calculated using the CKD EPI equation. This calculation has not been validated in all clinical situations. eGFR's  persistently <60 mL/min signify possible Chronic Kidney Disease.   . Anion gap 09/13/2014 8  5 - 15 Final  . Color, Urine 09/13/2014 YELLOW  YELLOW Final  . APPearance 09/13/2014 CLEAR  CLEAR Final  . Specific Gravity, Urine 09/13/2014 1.023  1.005 - 1.030 Final  . pH 09/13/2014 6.0  5.0 - 8.0 Final  . Glucose, UA 09/13/2014 250* NEGATIVE mg/dL Final  . Hgb urine dipstick 09/13/2014 NEGATIVE  NEGATIVE Final  . Bilirubin Urine 09/13/2014 NEGATIVE  NEGATIVE Final  . Ketones, ur 09/13/2014 NEGATIVE  NEGATIVE mg/dL Final  . Protein, ur 09/13/2014 NEGATIVE  NEGATIVE mg/dL Final  . Urobilinogen, UA 09/13/2014 0.2  0.0 - 1.0 mg/dL Final  . Nitrite 09/13/2014 NEGATIVE  NEGATIVE Final  . Leukocytes, UA 09/13/2014 NEGATIVE  NEGATIVE Final   MICROSCOPIC NOT DONE ON URINES WITH NEGATIVE PROTEIN, BLOOD, LEUKOCYTES, NITRITE, OR GLUCOSE <1000 mg/dL.    No results found.   Assessment/Plan   ICD-9-CM ICD-10-CM   1. Warfarin-induced coagulopathy (HCC) 286.9 T45.511A    E934.2 D68.9   2. Peripheral vascular disease (HCC) 443.9 I73.9   3. Primary osteoarthritis involving multiple joints 715.09 M15.0   4. Chronic systolic heart failure (HCC) 428.22 I50.22   5. Essential hypertension 401.9 I10   6. Type II diabetes mellitus with neurological manifestations ([place]) 250.60 E11.49   7. Depression 311 F32.9   8. Gastroesophageal reflux disease without esophagitis 530.81 K21.9   9. Chronic obstructive pulmonary disease, unspecified COPD type (HCC) 496 J44.9     Hold coumadin x 2 days. Repeat INR on 02/08/15. T/c xeralto due to difficulty in stabilizing INR  Cont other meds as ordered  Weight up 19 lbs since June 2016. No signs of HF. Probably due to sedentary lifestyle. Encourage exercise as tolerated. Weight daily  PT/OT/ST as indicated  Will follow  Nicola Heinemann S. Perlie Gold  [facility] [address] [place], [place] [address] [phone] Cell  (Monday-Friday 8 AM - 5 PM) [phone] After 5 PM and follow prompts

## 2015-02-08 ENCOUNTER — Encounter: Payer: Self-pay | Admitting: Adult Health

## 2015-02-08 NOTE — Progress Notes (Signed)
Patient ID: [name], male   DOB: 1940/05/30, 74 y.o.   MRN: [id number]   Facility: [facility]      Allergies  Allergen Reactions  . Vancomycin Other (See Comments)    "red man syndrome"  . Diazepam Anxiety    REACTION: makes patient cry    Chief Complaint  Patient presents with  . Acute Visit    right hand and wrist pain     HPI:  He is complaining of right wrist and hand pain. The pain has been present for the past several days with some swelling present. There is no redness or warmth present. He does have some pain with movement. He denies any known trauma or falls.    Past Medical History  Diagnosis Date  . Depression   . Diabetes mellitus   . COPD (chronic obstructive pulmonary disease) ([place])   . Adhesive capsulitis   . MRSA bacteremia     2011 - possible endocarditis, received 6 weeks IV treatment  . Hyperlipidemia   . Anxiety   . GERD (gastroesophageal reflux disease)   . CAD (coronary artery disease)   . 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 CVA (cerebral infarction) Questionable history  . Diverticulosis   . Osteomyelitis ([place]) 2012    Sternoclavicular joint   . Chronic back pain   . Angina   . Myocardial infarction ([place])   . CHF (congestive heart failure) ([place])   . Shortness of breath   . Sleep apnea   . Chronic kidney disease     hx of BPH  . Neuromuscular disorder ([place])     HX of diabetic periferal neuropathy  . Hypertension   . Peripheral vascular disease ([place])   . On home oxygen therapy     "1.5L prn" (04/12/2014)  . Falls frequently 06/2014    Past Surgical History  Procedure Laterality Date  . Septic arthritis      Removal of infected CABG wire by Dr Arlyce Dice - 2011  . Coronary artery bypass graft  1992  . Fracture surgery  1980s    Hip  . Spine surgery  2002    Cervical fusion vertebroplasty C3-4-5  . Coronary stent placement  1999, 2002  . Doppler echocardiography  Sept 2011    EF 50-55% with some impaired  diastolic relaxation  . Colonoscopy  2006     Multiple polyps removed, repeat in 5 years  . Right iliopopliteal bypass - [place]  . Femoral-popliteal bypass graft  2008    Left  . Cardiac catheterization  October 18, 2010    Known obstructive disease, no further blockages  . Cardiac catheterization  03/17/2005    EF 35-40%  . US echocardiography  03/04/2006    EF 50-55%  . Amputation  03/16/2011    Procedure: AMPUTATION DIGIT;  Surgeon: Angelia Mould, MD;  Location: [facility] OR;  Service: Vascular;  Laterality: Left;  Great toe  . Hip arthroplasty Left     VITAL SIGNS BP 134/76 mmHg  Pulse 74  Ht 5\' 8"  (1.727 m)  Wt 276 lb (125.193 kg)  BMI 41.98 kg/m2  Patient's Medications  New Prescriptions   No medications on file  Previous Medications   ACETAMINOPHEN-CODEINE (TYLENOL #4) 300-60 MG PER TABLET    Take 1-2 tablets by mouth every 8 (eight) hours as needed for moderate pain.   BISOPROLOL (ZEBETA) 5 MG TABLET    Take 0.5 tablets (2.5 mg total)  by mouth daily.   HYDROCODONE-ACETAMINOPHEN (NORCO) 7.5-325 MG TABLET    Take one tablet by mouth at bedtime and take one tablet by mouth once daily prior to therapy and take one tablet by mouth three times daily as needed for pain. Not to exceed 3000mg  of APAP form all sources/24hr from all sources   INSULIN ASPART (NOVOLOG) 100 UNIT/ML INJECTION    Use sliding scale as prescribed:  0 - 150 0 units; 151 - 200 2 units, 201 - 250 4 units; 251 - 200 6 units, 301 - 350 8 units   INSULIN NPH HUMAN (HUMULIN N,NOVOLIN N) 100 UNIT/ML INJECTION    Inject 0.15 mLs (15 Units total) into the skin 2 (two) times daily before a meal.   LIDOCAINE (LIDODERM) 5 %    Place 1 patch onto the skin daily. Remove & Discard patch within 12 hours or as directed by MD   LISINOPRIL (PRINIVIL,ZESTRIL) 2.5 MG TABLET    Take 5 mg by mouth daily.    LORAZEPAM (ATIVAN) 0.5 MG TABLET    Take 0.5 tablets (0.25 mg total) by mouth at bedtime.   MECLIZINE (ANTIVERT) 25 MG  TABLET    Take 1 tablet (25 mg total) by mouth 3 (three) times daily as needed for dizziness.   MIRTAZAPINE (REMERON) 15 MG TABLET    Take 1 tablet (15 mg total) by mouth at bedtime.   NITROGLYCERIN (NITROSTAT) 0.4 MG SL TABLET    Place 1 tablet (0.4 mg total) under the tongue as needed. For chest pains   OMEPRAZOLE (PRILOSEC) 40 MG CAPSULE    Take 1 capsule (40 mg total) by mouth daily.   SIMVASTATIN (ZOCOR) 80 MG TABLET    Take 1 tablet (80 mg total) by mouth daily.   TAMSULOSIN (FLOMAX) 0.4 MG CAPS CAPSULE    Take 1 capsule (0.4 mg total) by mouth daily after supper.   TORSEMIDE (DEMADEX) 20 MG TABLET    TAKE ONE TABLET BY MOUTH EVERY DAY   VENLAFAXINE XR (EFFEXOR-XR) 37.5 MG 24 HR CAPSULE    TAKE ONE CAPSULE BY MOUTH ONCE DAILY   WARFARIN (COUMADIN) 5 MG TABLET    Take 1 tablet (5 mg total) by mouth daily at 6 PM.  Modified Medications   No medications on file  Discontinued Medications   No medications on file     SIGNIFICANT DIAGNOSTIC EXAMS   09-04-14: bilateral hip and pelvic x-ray: Left hip arthroplasty with mild acetabular protrusio. No fracture or dislocation is seen. No interval change.  09-04-14: right femur x-ray: No fracture or dislocation is seen.   09-04-14: left femur x-ray: No acute abnormality.  09-13-14: left hip and pelvic x-ray: No acute bony abnormality.   09-13-14: right shoulder x-ray: Advanced degenerative change in the shoulder joint. Negative for fracture.    LABS REVIEWED:   05-10-14: hgb a1c 7.4 09-13-14: wbc 10.4; hgb 13.3; hct 41.7; mcv 81.8; plt 188; glucose 226; bun 23; creat 1.12; k+4.2; na++138  09-18-14: inr 2.55: coumadin 5 mg daily  11-06-14: inr 1.9      Review of Systems Constitutional: Negative for malaise/fatigue.  Respiratory: Negative for cough and shortness of breath.   Cardiovascular: Negative for chest pain, palpitations and leg swelling.  Gastrointestinal: Negative for heartburn, abdominal pain and constipation.  Musculoskeletal:  Positive for myalgias and joint pain.  Skin: Negative.   Neurological: Negative for headaches.  Psychiatric/Behavioral: Negative for depression. The patient is not nervous/anxious    Physical Exam Constitutional: He is oriented  to person, place, and time. No distress.  Obese   Neck: Neck supple. No JVD present. No thyromegaly present.  Cardiovascular: Normal rate, regular rhythm and intact distal pulses.   Respiratory: Effort normal and breath sounds normal. No respiratory distress.  GI: Soft. Bowel sounds are normal. He exhibits no distension. There is no tenderness.  Musculoskeletal:  Is able to move all extremities Trace lower extremity edema   Right wrist is slightly swollen He is able to move hand; fingers; wrist  Neurological: He is alert and oriented to person, place, and time.  Skin: Skin is warm and dry. He is not diaphoretic.  Lower extremities discolored   Psychiatric: He has a normal mood and affect.    ASSESSMENT/ PLAN:  1. Right wrist/hand pain: will being aleve 2 tabs twice daily and will get an x-ray of her right hand and wrist     Ok Edwards NP [facility]  Contact [phone] Monday through Friday 8am- 5pm  After hours call [phone]

## 2015-02-15 ENCOUNTER — Encounter: Payer: Self-pay | Admitting: Adult Health

## 2015-02-15 DIAGNOSIS — E119 Type 2 diabetes mellitus without complications: Secondary | ICD-10-CM | POA: Insufficient documentation

## 2015-02-15 DIAGNOSIS — E1169 Type 2 diabetes mellitus with other specified complication: Secondary | ICD-10-CM | POA: Insufficient documentation

## 2015-02-15 DIAGNOSIS — M869 Osteomyelitis, unspecified: Secondary | ICD-10-CM

## 2015-02-15 MED ORDER — INSULIN NPH (HUMAN) (ISOPHANE) 100 UNIT/ML ~~LOC~~ SUSP: 22.0000 [IU] | Freq: Two times a day (BID) | SUBCUTANEOUS | Status: DC

## 2015-02-15 MED ORDER — INSULIN ASPART 100 UNIT/ML ~~LOC~~ SOLN: 8.0000 [IU] | Freq: Three times a day (TID) | SUBCUTANEOUS | Status: DC

## 2015-02-15 MED ORDER — VENLAFAXINE HCL ER 37.5 MG PO CP24: 75.0000 mg | ORAL_CAPSULE | Freq: Every day | ORAL | Status: DC

## 2015-02-15 NOTE — Progress Notes (Signed)
Patient ID: [name], male   DOB: 1940-06-06, 74 y.o.   MRN: [id number]    Facility:  [facility]       Allergies  Allergen Reactions  . Vancomycin Other (See Comments)    "red man syndrome"  . Diazepam Anxiety    REACTION: makes patient cry    Chief Complaint  Patient presents with  . Medical Management of Chronic Issues    HPI:  He is a long term resident of this facility being seen for the management of his chronic illnesses. Overall there is little change in his status. He continues to have right hand pain; but he has just started taking the aleve. His cbg's are elevated and will require medication adjustment.    Past Medical History  Diagnosis Date  . Depression   . Diabetes mellitus   . COPD (chronic obstructive pulmonary disease) ([place])   . Adhesive capsulitis   . MRSA bacteremia     2011 - possible endocarditis, received 6 weeks IV treatment  . Hyperlipidemia   . Anxiety   . GERD (gastroesophageal reflux disease)   . CAD (coronary artery disease)   . 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 CVA (cerebral infarction) Questionable history  . Diverticulosis   . Osteomyelitis ([place]) 2012    Sternoclavicular joint   . Chronic back pain   . Angina   . Myocardial infarction ([place])   . CHF (congestive heart failure) ([place])   . Shortness of breath   . Sleep apnea   . Chronic kidney disease     hx of BPH  . Neuromuscular disorder ([place])     HX of diabetic periferal neuropathy  . Hypertension   . Peripheral vascular disease ([place])   . On home oxygen therapy     "1.5L prn" (04/12/2014)  . Falls frequently 06/2014    Past Surgical History  Procedure Laterality Date  . Septic arthritis      Removal of infected CABG wire by Dr Arlyce Dice - 2011  . Coronary artery bypass graft  1992  . Fracture surgery  1980s    Hip  . Spine surgery  2002    Cervical fusion vertebroplasty C3-4-5  . Coronary stent placement  1999, 2002  . Doppler echocardiography   Sept 2011    EF 50-55% with some impaired diastolic relaxation  . Colonoscopy  2006     Multiple polyps removed, repeat in 5 years  . Right iliopopliteal bypass - [place]  . Femoral-popliteal bypass graft  2008    Left  . Cardiac catheterization  October 18, 2010    Known obstructive disease, no further blockages  . Cardiac catheterization  03/17/2005    EF 35-40%  . US echocardiography  03/04/2006    EF 50-55%  . Amputation  03/16/2011    Procedure: AMPUTATION DIGIT;  Surgeon: Angelia Mould, MD;  Location: [facility] OR;  Service: Vascular;  Laterality: Left;  Great toe  . Hip arthroplasty Left     VITAL SIGNS BP 134/76 mmHg  Pulse 74  Ht 5\' 8"  (1.727 m)  Wt 275 lb (124.739 kg)  BMI 41.82 kg/m2  Patient's Medications  New Prescriptions   No medications on file  Previous Medications   BISOPROLOL (ZEBETA) 5 MG TABLET    Take 0.5 tablets (2.5 mg total) by mouth daily.   HYDROCODONE-ACETAMINOPHEN (NORCO) 7.5-325 MG TABLET    Take one tablet by mouth at bedtime and take one  tablet by mouth once daily prior to therapy and take one tablet by mouth three times daily as needed for pain. Not to exceed 3000mg  of APAP form all sources/24hr from all sources   INSULIN ASPART (NOVOLOG) 100 UNIT/ML INJECTION    Use sliding scale as prescribed:  0 - 150 0 units; 151 - 200 2 units, 201 - 250 4 units; 251 - 200 6 units, 301 - 350 8 units   INSULIN NPH HUMAN (HUMULIN N,NOVOLIN N) 100 UNIT/ML INJECTION    Inject 0.15 mLs (15 Units total) into the skin 2 (two) times daily before a meal.   LIDOCAINE (LIDODERM) 5 %    Place 1 patch onto the skin daily. Remove & Discard patch within 12 hours or as directed by MD   LISINOPRIL (PRINIVIL,ZESTRIL) 2.5 MG TABLET    Take 2.5 mg by mouth daily.    LORAZEPAM (ATIVAN) 0.5 MG TABLET    Take 0.5 tablets (0.25 mg total) by mouth at bedtime.   MECLIZINE (ANTIVERT) 25 MG TABLET    Take 1 tablet (25 mg total) by mouth 3 (three) times daily as needed for dizziness.    MELATONIN 3 MG TABS    Take 3 mg by mouth at bedtime.   NAPROXEN SODIUM (ANAPROX) 220 MG TABLET    Take 440 mg by mouth 2 (two) times daily with a meal.   NITROGLYCERIN (NITROSTAT) 0.4 MG SL TABLET    Place 1 tablet (0.4 mg total) under the tongue as needed. For chest pains   OMEPRAZOLE (PRILOSEC) 40 MG CAPSULE    Take 1 capsule (40 mg total) by mouth daily.   SIMVASTATIN (ZOCOR) 80 MG TABLET    Take 1 tablet (80 mg total) by mouth daily.   TAMSULOSIN (FLOMAX) 0.4 MG CAPS CAPSULE    Take 1 capsule (0.4 mg total) by mouth daily after supper.   TORSEMIDE (DEMADEX) 20 MG TABLET    TAKE ONE TABLET BY MOUTH EVERY DAY   VENLAFAXINE XR (EFFEXOR-XR) 37.5 MG 24 HR CAPSULE    TAKE ONE CAPSULE BY MOUTH ONCE DAILY   WARFARIN (COUMADIN) 5 MG TABLET    Take 1 tablet (5 mg total) by mouth daily at 6 PM.  Modified Medications   No medications on file  Discontinued Medications     SIGNIFICANT DIAGNOSTIC EXAMS   09-04-14: bilateral hip and pelvic x-ray: Left hip arthroplasty with mild acetabular protrusio. No fracture or dislocation is seen. No interval change.  09-04-14: right femur x-ray: No fracture or dislocation is seen.   09-04-14: left femur x-ray: No acute abnormality.  09-13-14: left hip and pelvic x-ray: No acute bony abnormality.   09-13-14: right shoulder x-ray: Advanced degenerative change in the shoulder joint. Negative for Fracture.  12-22-14: right hand and right wrist x-ray: no acute process; right hand osteoarthritis present.     LABS REVIEWED:   05-10-14: hgb a1c 7.4 09-13-14: wbc 10.4; hgb 13.3; hct 41.7; mcv 81.8; plt 188; glucose 226; bun 23; creat 1.12; k+4.2; na++138  09-18-14: inr 2.55: coumadin 5 mg daily  11-06-14: inr 1.9  12-25-14: inr 3.03      Review of Systems Constitutional: Negative for malaise/fatigue.  Respiratory: Negative for cough and shortness of breath.   Cardiovascular: Negative for chest pain, palpitations and leg swelling.  Gastrointestinal: Negative for  heartburn, abdominal pain and constipation.  Musculoskeletal: Positive for myalgias and joint pain.  Skin: Negative.   Neurological: Negative for headaches.  Psychiatric/Behavioral: has depression. The patient is not nervous/anxious  Physical Exam Constitutional: He is oriented to person, place, and time. No distress.  Obese   Neck: Neck supple. No JVD present. No thyromegaly present.  Cardiovascular: Normal rate, regular rhythm and intact distal pulses.   Respiratory: Effort normal and breath sounds normal. No respiratory distress.  GI: Soft. Bowel sounds are normal. He exhibits no distension. There is no tenderness.  Musculoskeletal:  Is able to move all extremities Trace lower extremity edema   Neurological: He is alert and oriented to person, place, and time.  Skin: Skin is warm and dry. He is not diaphoretic.  Lower extremities discolored   Psychiatric: He has a normal mood and affect.       ASSESSMENT/ PLAN:  1. Chronic systolic heart failure: will conitnue demadex 20 mg daily will monitor   2. Hypertension: will continue zebeta 2.5 mg daily; lisinopril 2.5 mg daily and will monitor  3. Diabetes: his hgb a1c is 7.4; will increase to novolog 8 units prior to meals with an additional 5 units  for cbg >=150; will continue  NPH  22 units twice daily with meals;   4. CAD: status post cabg and stents; no complaints of chest pain present; will continue zebeta 2.5 mg daily; and prn ntg  5. Gerd: continue prilosec 40 mg daily   6. CVA: is neurologically stable; is on long term coumadin therapy   7. PE/inr management: he is presently stable; on long term coumadin therapy;  will continue coumadin 5  mg daily   8. Dyslipidemia: will continue zocor 80 mg daily   9. BPH: will continue flomax 0.4 mg daily   10. Osteoarthritis: has pain in numerous joints; will continue lidoderm patch as directed to shoulder; will continue  vicodin 7.5/325 mg prior to therapy and at night with  tid prn. Will aleve 2 tabs twice daily for his right hand pain; will have therapy evaluate and treat as indicated.   11. Anxiety: will  Increase effextor xr 75 mg daily; and will continue  ativan 0.25 mg nightly and will monitor and  melatonin 3 mg nightly for sleep       Ok Edwards NP [facility]  Contact [phone] Monday through Friday 8am- 5pm  After hours call [phone]

## 2015-02-23 LAB — TSH: TSH: 4.03 u[IU]/mL (ref <=5.90)

## 2015-03-29 ENCOUNTER — Non-Acute Institutional Stay (SKILLED_NURSING_FACILITY): Payer: Medicare Other | Admitting: Adult Health

## 2015-03-29 DIAGNOSIS — I5022 Chronic systolic (congestive) heart failure: Secondary | ICD-10-CM | POA: Diagnosis not present

## 2015-03-29 DIAGNOSIS — J449 Chronic obstructive pulmonary disease, unspecified: Secondary | ICD-10-CM

## 2015-03-29 DIAGNOSIS — I10 Essential (primary) hypertension: Secondary | ICD-10-CM | POA: Diagnosis not present

## 2015-03-29 DIAGNOSIS — N400 Enlarged prostate without lower urinary tract symptoms: Secondary | ICD-10-CM

## 2015-03-29 DIAGNOSIS — E1169 Type 2 diabetes mellitus with other specified complication: Secondary | ICD-10-CM

## 2015-03-29 DIAGNOSIS — F329 Major depressive disorder, single episode, unspecified: Secondary | ICD-10-CM | POA: Diagnosis not present

## 2015-03-29 DIAGNOSIS — E785 Hyperlipidemia, unspecified: Secondary | ICD-10-CM | POA: Diagnosis not present

## 2015-03-29 DIAGNOSIS — E1149 Type 2 diabetes mellitus with other diabetic neurological complication: Secondary | ICD-10-CM | POA: Diagnosis not present

## 2015-03-29 DIAGNOSIS — Z7901 Long term (current) use of anticoagulants: Secondary | ICD-10-CM | POA: Diagnosis not present

## 2015-03-29 DIAGNOSIS — E559 Vitamin D deficiency, unspecified: Secondary | ICD-10-CM

## 2015-03-29 DIAGNOSIS — F32A Depression, unspecified: Secondary | ICD-10-CM

## 2015-04-04 LAB — BASIC METABOLIC PANEL
BUN: 19 mg/dL (ref 4–21)
CREATININE: 1.1 mg/dL (ref 0.6–1.3)
Glucose: 136 mg/dL
Potassium: 3.7 mmol/L (ref 3.4–5.3)
Sodium: 142 mmol/L (ref 137–147)

## 2015-04-05 ENCOUNTER — Non-Acute Institutional Stay (SKILLED_NURSING_FACILITY): Payer: Medicare Other | Admitting: Adult Health

## 2015-04-05 DIAGNOSIS — J302 Other seasonal allergic rhinitis: Secondary | ICD-10-CM | POA: Diagnosis not present

## 2015-04-23 ENCOUNTER — Non-Acute Institutional Stay (SKILLED_NURSING_FACILITY): Payer: Medicare Other | Admitting: Adult Health

## 2015-04-23 DIAGNOSIS — L97929 Non-pressure chronic ulcer of unspecified part of left lower leg with unspecified severity: Secondary | ICD-10-CM

## 2015-04-23 DIAGNOSIS — I83029 Varicose veins of left lower extremity with ulcer of unspecified site: Secondary | ICD-10-CM | POA: Diagnosis not present

## 2015-04-23 DIAGNOSIS — L03116 Cellulitis of left lower limb: Secondary | ICD-10-CM

## 2015-04-25 LAB — POCT INR
INR: 2 — AB (ref 0.9–1.1)
INR: 2 — AB (ref <=1.1)

## 2015-04-25 LAB — PROTIME-INR: PROTIME: 23.3 s — AB (ref 10.0–13.8)

## 2015-05-03 ENCOUNTER — Encounter: Payer: Self-pay | Admitting: Adult Health

## 2015-05-03 DIAGNOSIS — E559 Vitamin D deficiency, unspecified: Secondary | ICD-10-CM | POA: Insufficient documentation

## 2015-05-03 MED ORDER — TORSEMIDE 20 MG PO TABS: 20.0000 mg | ORAL_TABLET | Freq: Two times a day (BID) | ORAL | Status: DC

## 2015-05-03 MED ORDER — VITAMIN D (ERGOCALCIFEROL) 1.25 MG (50000 UNIT) PO CAPS: 50000.0000 [IU] | ORAL_CAPSULE | ORAL | Status: AC

## 2015-05-03 NOTE — Progress Notes (Signed)
Patient ID: [name], male   DOB: 1940-12-14, 75 y.o.   MRN: [id number]    Facility: [facility]       Allergies  Allergen Reactions  . Vancomycin Other (See Comments)    "red man syndrome"  . Diazepam Anxiety    REACTION: makes patient cry    Chief Complaint  Patient presents with  . Medical Management of Chronic Issues    HPI:  He is a long term resident of this facility being seen for the management of his chronic illnesses. His weight is fluctuating current weight is 276 pounds; weight on 03-21-15 was 268 pounds. He does have a cough with shortness of breath and is having difficulty sleeping at night.     Past Medical History  Diagnosis Date  . Depression   . Diabetes mellitus   . COPD (chronic obstructive pulmonary disease) ([place])   . Adhesive capsulitis   . MRSA bacteremia     2011 - possible endocarditis, received 6 weeks IV treatment  . Hyperlipidemia   . Anxiety   . GERD (gastroesophageal reflux disease)   . CAD (coronary artery disease)   . 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 CVA (cerebral infarction) Questionable history  . Diverticulosis   . Osteomyelitis ([place]) 2012    Sternoclavicular joint   . Chronic back pain   . Angina   . Myocardial infarction ([place])   . CHF (congestive heart failure) ([place])   . Shortness of breath   . Sleep apnea   . Chronic kidney disease     hx of BPH  . Neuromuscular disorder ([place])     HX of diabetic periferal neuropathy  . Hypertension   . Peripheral vascular disease ([place])   . On home oxygen therapy     "1.5L prn" (04/12/2014)  . Falls frequently 06/2014    Past Surgical History  Procedure Laterality Date  . Septic arthritis      Removal of infected CABG wire by Dr Arlyce Dice - 2011  . Coronary artery bypass graft  1992  . Fracture surgery  1980s    Hip  . Spine surgery  2002    Cervical fusion vertebroplasty C3-4-5  . Coronary stent placement  1999, 2002  . Doppler  echocardiography  Sept 2011    EF 50-55% with some impaired diastolic relaxation  . Colonoscopy  2006     Multiple polyps removed, repeat in 5 years  . Right iliopopliteal bypass - [place]  . Femoral-popliteal bypass graft  2008    Left  . Cardiac catheterization  October 18, 2010    Known obstructive disease, no further blockages  . Cardiac catheterization  03/17/2005    EF 35-40%  . US echocardiography  03/04/2006    EF 50-55%  . Amputation  03/16/2011    Procedure: AMPUTATION DIGIT;  Surgeon: Angelia Mould, MD;  Location[facility] OR;  Service: Vascular;  Laterality: Left;  Great toe  . Hip arthroplasty Left     VITAL SIGNS BP 128/70 mmHg  Pulse 76  Ht 5\' 8"  (1.727 m)  Wt 276 lb (125.193 kg)  BMI 41.98 kg/m2  SpO2 94%  Patient's Medications  New Prescriptions   No medications on file  Previous Medications   BISOPROLOL (ZEBETA) 5 MG TABLET    Take 0.5 tablets (2.5 mg total) by mouth daily.   DOCUSATE SODIUM (COLACE) 100 MG CAPSULE    Take 100 mg by mouth 2 (  two) times daily.   DULOXETINE (CYMBALTA) 60 MG CAPSULE    Take 60 mg by mouth daily.   HYDROCODONE-ACETAMINOPHEN (NORCO) 7.5-325 MG TABLET    Take one tablet by mouth at bedtime and take one tablet by mouth once daily prior to therapy and take one tablet by mouth three times daily as needed for pain. Not to exceed 3000mg  of APAP form all sources/24hr from all sources   INSULIN ASPART (NOVOLOG) 100 UNIT/ML INJECTION    Inject 8-13 Units into the skin 3 (three) times daily with meals. Give 8 units with meals with an additional 5 units >150   INSULIN NPH HUMAN (HUMULIN N,NOVOLIN N) 100 UNIT/ML INJECTION    Inject 0.22 mLs (22 Units total) into the skin 2 (two) times daily before a meal.   LIDOCAINE (LIDODERM) 5 %    Place 1 patch onto the skin daily. Remove & Discard patch within 12 hours or as directed by MD   LISINOPRIL (PRINIVIL,ZESTRIL) 2.5 MG TABLET    Take 2.5 mg by mouth daily.    LORAZEPAM (ATIVAN) 0.5 MG TABLET     Take 0.5 tablets (0.25 mg total) by mouth at bedtime.   MECLIZINE (ANTIVERT) 25 MG TABLET    Take 1 tablet (25 mg total) by mouth 3 (three) times daily as needed for dizziness.   MELATONIN 3 MG TABS    Take 3 mg by mouth at bedtime.   NALOXEGOL OXALATE (MOVANTIK) 25 MG TABS TABLET    Take 25 mg by mouth daily.   NAPROXEN SODIUM (ANAPROX) 220 MG TABLET    Take 440 mg by mouth 2 (two) times daily with a meal.   NITROGLYCERIN (NITROSTAT) 0.4 MG SL TABLET    Place 1 tablet (0.4 mg total) under the tongue as needed. For chest pains   OMEPRAZOLE (PRILOSEC) 40 MG CAPSULE    Take 1 capsule (40 mg total) by mouth daily.   SIMVASTATIN (ZOCOR) 80 MG TABLET    Take 1 tablet (80 mg total) by mouth daily.   TAMSULOSIN (FLOMAX) 0.4 MG CAPS CAPSULE    Take 1 capsule (0.4 mg total) by mouth daily after supper.   TORSEMIDE (DEMADEX) 20 MG TABLET    TAKE ONE TABLET BY MOUTH EVERY DAY   TRAZODONE (DESYREL) 100 MG TABLET    Take 50 mg by mouth at bedtime.   WARFARIN (COUMADIN) 5 MG TABLET    Take 1 tablet (5 mg total) by mouth daily at 6 PM.  Modified Medications   No medications on file  Discontinued Medications   VENLAFAXINE XR (EFFEXOR-XR) 37.5 MG 24 HR CAPSULE    Take 2 capsules (75 mg total) by mouth daily with breakfast.     SIGNIFICANT DIAGNOSTIC EXAMS   09-04-14: bilateral hip and pelvic x-ray: Left hip arthroplasty with mild acetabular protrusio. No fracture or dislocation is seen. No interval change.  09-04-14: right femur x-ray: No fracture or dislocation is seen.   09-04-14: left femur x-ray: No acute abnormality.  09-13-14: left hip and pelvic x-ray: No acute bony abnormality.   09-13-14: right shoulder x-ray: Advanced degenerative change in the shoulder joint. Negative for Fracture.  12-22-14: right hand and right wrist x-ray: no acute process; right hand osteoarthritis present.     LABS REVIEWED:   05-10-14: hgb a1c 7.4 09-13-14: wbc 10.4; hgb 13.3; hct 41.7; mcv 81.8; plt 188; glucose 226;  bun 23; creat 1.12; k+4.2; na++138  09-18-14: inr 2.55: coumadin 5 mg daily  11-06-14: inr 1.9  12-25-14: inr 3.03  02-23-15: tsh 4.032; vit d 11  03-08-15: wbc 7.7; hgb 11.5; hct 35.7; mcv 79.7; plt 156; glucose 115; bun 19; creat 0.96; k+ 4.6; na++146; liver normal albumin 3.4 INR 1.66      Review of Systems Constitutional: Negative for malaise/fatigue.  Respiratory: has cough and shortness of breath    Cardiovascular: Negative for chest pain, palpitations and leg swelling.  Gastrointestinal: Negative for heartburn, abdominal pain and constipation.  Musculoskeletal: Positive for myalgias and joint pain.  Skin: Negative.   Neurological: Negative for headaches.  Psychiatric/Behavioral: has depression. The patient is not nervous/anxious    Physical Exam Constitutional: He is oriented to person, place, and time. No distress.  Obese   Neck: Neck supple. No JVD present. No thyromegaly present.  Cardiovascular: Normal rate, regular rhythm and intact distal pulses.   Respiratory: Effort normal and breath sounds normal. No respiratory distress.  GI: Soft. Bowel sounds are normal. He exhibits no distension. There is no tenderness.  Musculoskeletal:  Is able to move all extremities 1+ lower extremity edema   Neurological: He is alert and oriented to person, place, and time.  Skin: Skin is warm and dry. He is not diaphoretic.  Lower extremities discolored   Psychiatric: He has a normal mood and affect.       ASSESSMENT/ PLAN:  1. Chronic systolic heart failure: will increase demadex to 20 mg twice daily will check bmp in 2 weeks.    2. Hypertension: will continue zebeta 2.5 mg daily; lisinopril 2.5 mg daily and will monitor  3. Diabetes: his hgb a1c is 7.4; will increase to novolog 8 units prior to meals with an additional 5 units  for cbg >=150; will continue  NPH  22 units twice daily with meals;   4. CAD: status post cabg and stents; no complaints of chest pain present; will  continue zebeta 2.5 mg daily; and prn ntg  5. Gerd: continue prilosec 40 mg daily   6. CVA: is neurologically stable; is on long term coumadin therapy   7. PE/inr management: he is presently stable; on long term coumadin therapy;  will continue coumadin 5  mg daily   8. Dyslipidemia: will continue zocor 80 mg daily   9. BPH: will continue flomax 0.4 mg daily   10. Osteoarthritis: has pain in numerous joints; will continue lidoderm patch as directed to shoulder; will continue  vicodin 7.5/325 mg prior to therapy and at night with tid prn. Will aleve 2 tabs twice daily for his right hand pain;   11. Anxiety: will continue trazodone 50 mg nightly   ativan 0.25 mg nightly and will monitor and  melatonin 3 mg nightly for sleep   12. Vit D def: will begin vit d 50,000 units weekly        Ok Edwards NP [facility]  Contact [phone] Monday through Friday 8am- 5pm  After hours call [phone]

## 2015-05-04 ENCOUNTER — Encounter: Payer: Self-pay | Admitting: Adult Health

## 2015-05-04 ENCOUNTER — Non-Acute Institutional Stay (SKILLED_NURSING_FACILITY): Payer: Medicare Other | Admitting: Adult Health

## 2015-05-04 DIAGNOSIS — E1149 Type 2 diabetes mellitus with other diabetic neurological complication: Secondary | ICD-10-CM | POA: Diagnosis not present

## 2015-05-04 DIAGNOSIS — J449 Chronic obstructive pulmonary disease, unspecified: Secondary | ICD-10-CM

## 2015-05-04 DIAGNOSIS — Z7901 Long term (current) use of anticoagulants: Secondary | ICD-10-CM | POA: Diagnosis not present

## 2015-05-04 DIAGNOSIS — E785 Hyperlipidemia, unspecified: Secondary | ICD-10-CM | POA: Diagnosis not present

## 2015-05-04 DIAGNOSIS — F329 Major depressive disorder, single episode, unspecified: Secondary | ICD-10-CM | POA: Diagnosis not present

## 2015-05-04 DIAGNOSIS — I10 Essential (primary) hypertension: Secondary | ICD-10-CM | POA: Diagnosis not present

## 2015-05-04 DIAGNOSIS — I5022 Chronic systolic (congestive) heart failure: Secondary | ICD-10-CM | POA: Diagnosis not present

## 2015-05-04 DIAGNOSIS — K5903 Drug induced constipation: Secondary | ICD-10-CM | POA: Diagnosis not present

## 2015-05-04 DIAGNOSIS — R600 Localized edema: Secondary | ICD-10-CM | POA: Diagnosis not present

## 2015-05-04 DIAGNOSIS — E1169 Type 2 diabetes mellitus with other specified complication: Secondary | ICD-10-CM | POA: Diagnosis not present

## 2015-05-04 DIAGNOSIS — T402X5A Adverse effect of other opioids, initial encounter: Secondary | ICD-10-CM | POA: Diagnosis not present

## 2015-05-04 DIAGNOSIS — F32A Depression, unspecified: Secondary | ICD-10-CM

## 2015-05-04 NOTE — Progress Notes (Signed)
Patient ID: [name], male   DOB: [birth year], 75 y.o.   MRN: [id number]   Facility: [facility]       Allergies  Allergen Reactions  . Vancomycin Other (See Comments)    "red man syndrome"  . Diazepam Anxiety    REACTION: makes patient cry    Chief Complaint  Patient presents with  . Medical Management of Chronic Issues    Routine visit     HPI:  He is a long term resident of this facility being seen for the management of his chronic illnesses. Overall his status remains without significant change. He is not voicing any concerns today. There are no nursing concerns today.     Past Medical History  Diagnosis Date  . Depression   . Diabetes mellitus   . COPD (chronic obstructive pulmonary disease) ([place] )   . Adhesive capsulitis   . MRSA bacteremia     2011 - possible endocarditis, received 6 weeks IV treatment  . Hyperlipidemia   . Anxiety   . GERD (gastroesophageal reflux disease)   . CAD (coronary artery disease)   . 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 CVA (cerebral infarction) Questionable history  . Diverticulosis   . Osteomyelitis ([place]) 2012    Sternoclavicular joint   . Chronic back pain   . Angina   . Myocardial infarction ([place])   . CHF (congestive heart failure) ([place])   . Shortness of breath   . Sleep apnea   . Chronic kidney disease     hx of BPH  . Neuromuscular disorder ([place])     HX of diabetic periferal neuropathy  . Hypertension   . Peripheral vascular disease ([place])   . On home oxygen therapy     "1.5L prn" (04/12/2014)  . Falls frequently 06/2014    Past Surgical History  Procedure Laterality Date  . Septic arthritis      Removal of infected CABG wire by Dr Arlyce Dice - 2011  . Coronary artery bypass graft  1992  . Fracture surgery  1980s    Hip  . Spine surgery  2002    Cervical fusion vertebroplasty C3-4-5  . Coronary stent placement  1999, 2002  . Doppler echocardiography  Sept 2011    EF 50-55% with  some impaired diastolic relaxation  . Colonoscopy  2006     Multiple polyps removed, repeat in 5 years  . Right iliopopliteal bypass - [place]  . Femoral-popliteal bypass graft  2008    Left  . Cardiac catheterization  October 18, 2010    Known obstructive disease, no further blockages  . Cardiac catheterization  03/17/2005    EF 35-40%  . US echocardiography  03/04/2006    EF 50-55%  . Amputation  03/16/2011    Procedure: AMPUTATION DIGIT;  Surgeon: Angelia Mould, MD;  Location: [facility] OR;  Service: Vascular;  Laterality: Left;  Great toe  . Hip arthroplasty Left     VITAL SIGNS BP 113/50 mmHg  Pulse 68  Temp(Src) 98.5 F (36.9 C) (Oral)  Resp 20  Ht 5\' 8"  (1.727 m)  Wt 278 lb (126.1 kg)  BMI 42.28 kg/m2  Patient's Medications  New Prescriptions   No medications on file  Previous Medications   BISOPROLOL (ZEBETA) 5 MG TABLET    Take 0.5 tablets (2.5 mg total) by mouth daily.   CEPHALEXIN (KEFLEX) 500 MG CAPSULE    Take 500 mg by mouth 2 (  two) times daily. For cellulitis until 05/07/15   COLLAGENASE (SANTYL) OINTMENT    Apply 1 application topically daily.   DOCUSATE SODIUM (COLACE) 100 MG CAPSULE    Take 100 mg by mouth 2 (two) times daily.   DULOXETINE (CYMBALTA) 60 MG CAPSULE    Take 60 mg by mouth daily.   HYDROCODONE-ACETAMINOPHEN (NORCO) 7.5-325 MG TABLET    Take one tablet by mouth at bedtime and take one tablet by mouth once daily prior to therapy and take one tablet by mouth three times daily as needed for pain. Not to exceed 3000mg  of APAP form all sources/24hr from all sources   INSULIN ASPART (NOVOLOG) 100 UNIT/ML INJECTION    Per sliding scale 0-149 = 0 units, 150 + = 5 units   INSULIN LISPRO (HUMALOG KWIKPEN) 100 UNIT/ML KIWKPEN    Inject 8 Units into the skin 3 (three) times daily before meals.   INSULIN NPH HUMAN (HUMULIN N,NOVOLIN N) 100 UNIT/ML INJECTION    Inject 0.22 mLs (22 Units total) into the skin 2 (two) times daily before a meal.   LIDOCAINE  (LIDODERM) 5 %    Apply to shoulder topically two times daily for pain. Apply in the am and remove in pm. Remove & Discard patch within 12 hours or as directed by MD   LISINOPRIL (PRINIVIL,ZESTRIL) 2.5 MG TABLET    Take 2.5 mg by mouth daily.    LORAZEPAM (ATIVAN) 0.5 MG TABLET    Take 0.5 tablets (0.25 mg total) by mouth at bedtime.   MECLIZINE (ANTIVERT) 25 MG TABLET    Take 1 tablet (25 mg total) by mouth 3 (three) times daily as needed for dizziness.   NALOXEGOL OXALATE (MOVANTIK) 25 MG TABS TABLET    Take 25 mg by mouth daily.   NAPROXEN SODIUM (ANAPROX) 220 MG TABLET    Take 440 mg by mouth 2 (two) times daily with a meal.   NITROGLYCERIN (NITROSTAT) 0.4 MG SL TABLET    Place 1 tablet (0.4 mg total) under the tongue as needed. For chest pains   OMEPRAZOLE (PRILOSEC) 40 MG CAPSULE    Take 1 capsule (40 mg total) by mouth daily.   PROPYLENE GLYCOL (SYSTANE BALANCE) 0.6 % SOLN    Apply 1 drop to eye 2 (two) times daily. Both eyes   SACCHAROMYCES BOULARDII (FLORASTOR PO)    Take by mouth. 100000 million units by mouth two times a day for cellulitis until 05/07/15   SIMVASTATIN (ZOCOR) 80 MG TABLET    Take 1 tablet (80 mg total) by mouth daily.   TAMSULOSIN (FLOMAX) 0.4 MG CAPS CAPSULE    Take 1 capsule (0.4 mg total) by mouth daily after supper.   TORSEMIDE (DEMADEX) 20 MG TABLET    Take 1 tablet (20 mg total) by mouth 2 (two) times daily.   TRAZODONE (DESYREL) 100 MG TABLET    Take 50 mg by mouth at bedtime.   VITAMIN D, ERGOCALCIFEROL, (DRISDOL) 50000 UNITS CAPS CAPSULE    Take 1 capsule (50,000 Units total) by mouth every 7 (seven) days.   WARFARIN SODIUM (COUMADIN PO)    Take 4.5 mg by mouth daily.  Modified Medications   No medications on file  Discontinued Medications     SIGNIFICANT DIAGNOSTIC EXAMS   09-04-14: bilateral hip and pelvic x-ray: Left hip arthroplasty with mild acetabular protrusio. No fracture or dislocation is seen. No interval change.  09-04-14: right femur x-ray: No  fracture or dislocation is seen.   09-04-14: left  femur x-ray: No acute abnormality.  09-13-14: left hip and pelvic x-ray: No acute bony abnormality.   09-13-14: right shoulder x-ray: Advanced degenerative change in the shoulder joint. Negative for Fracture.  12-22-14: right hand and right wrist x-ray: no acute process; right hand osteoarthritis present.     LABS REVIEWED:   05-10-14: hgb a1c 7.4 09-13-14: wbc 10.4; hgb 13.3; hct 41.7; mcv 81.8; plt 188; glucose 226; bun 23; creat 1.12; k+4.2; na++138  09-18-14: inr 2.55: coumadin 5 mg daily  11-06-14: inr 1.9  12-25-14: inr 3.03  02-23-15: tsh 4.032; vit d 11  03-08-15: wbc 7.7; hgb 11.5; hct 35.7; mcv 79.7; plt 156; glucose 115; bun 19; creat 0.96; k+ 4.6; na++146; liver normal albumin 3.4 INR 1.66  04-04-15: glucose 136; bun 19; creat 1.02; k+ 3.7; na++142     Review of Systems Constitutional: Negative for malaise/fatigue.  Respiratory: no cough or shortness of breath  Cardiovascular: Negative for chest pain, palpitations and leg swelling.  Gastrointestinal: Negative for heartburn, abdominal pain and constipation.  Musculoskeletal: Positive for myalgias and joint pain.  Skin: Negative.   Neurological: Negative for headaches.  Psychiatric/Behavioral: . The patient is not nervous/anxious    Physical Exam Constitutional: He is oriented to person, place, and time. No distress.  Obese   Neck: Neck supple. No JVD present. No thyromegaly present.  Cardiovascular: Normal rate, regular rhythm and intact distal pulses.   Respiratory: Effort normal and breath sounds normal. No respiratory distress.  GI: Soft. Bowel sounds are normal. He exhibits no distension. There is no tenderness.  Musculoskeletal:  Is able to move all extremities 1+ lower extremity edema   Neurological: He is alert and oriented to person, place, and time.  Skin: Skin is warm and dry. He is not diaphoretic.  Lower extremities discolored   Psychiatric: He has a  normal mood and affect.       ASSESSMENT/ PLAN:  1. Chronic systolic heart failure: will continue demadex  20 mg twice daily     2. Hypertension: will continue zebeta 2.5 mg daily; lisinopril 2.5 mg daily and will monitor  3. Diabetes: his hgb a1c is 7.4; will continue novolog 8 units prior to meals with an additional 5 units  for cbg >=150; will continue  NPH  22 units twice daily with meals;   4. CAD: status post cabg and stents; no complaints of chest pain present; will continue zebeta 2.5 mg daily; and prn ntg  5. Gerd: continue prilosec 40 mg daily   6. CVA: is neurologically stable; is on long term coumadin therapy   7. PE/inr management: he is presently stable; on long term coumadin therapy;  will continue coumadin 5  mg daily   8. Dyslipidemia: will continue zocor 80 mg daily   9. BPH: will continue flomax 0.4 mg daily   10. Osteoarthritis: has pain in numerous joints; will continue lidoderm patch as directed to shoulder; will continue  vicodin 7.5/325 mg prior to therapy and at night with tid prn. Will continue aleve 2 tabs twice daily for his right hand pain;   11. Anxiety: will continue trazodone 50 mg nightly   ativan 0.25 mg nightly and will monitor and  melatonin 3 mg nightly for sleep   12. Vit D def: will continue vit d 50,000 units weekly   13. 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

## 2015-05-06 DIAGNOSIS — T402X5A Adverse effect of other opioids, initial encounter: Secondary | ICD-10-CM | POA: Insufficient documentation

## 2015-05-06 DIAGNOSIS — K5903 Drug induced constipation: Secondary | ICD-10-CM | POA: Insufficient documentation

## 2015-05-22 ENCOUNTER — Encounter: Payer: Self-pay | Admitting: Adult Health

## 2015-05-22 DIAGNOSIS — L03116 Cellulitis of left lower limb: Secondary | ICD-10-CM | POA: Insufficient documentation

## 2015-05-22 DIAGNOSIS — L97929 Non-pressure chronic ulcer of unspecified part of left lower leg with unspecified severity: Secondary | ICD-10-CM

## 2015-05-22 DIAGNOSIS — I83029 Varicose veins of left lower extremity with ulcer of unspecified site: Secondary | ICD-10-CM | POA: Insufficient documentation

## 2015-05-22 DIAGNOSIS — J309 Allergic rhinitis, unspecified: Secondary | ICD-10-CM | POA: Insufficient documentation

## 2015-05-22 NOTE — Progress Notes (Signed)
Patient ID: [name], male   DOB: 1940-08-04, 75 y.o.   MRN: [id number]   Facility: [facility]       Allergies  Allergen Reactions  . Vancomycin Other (See Comments)    "red man syndrome"  . Diazepam Anxiety    REACTION: makes patient cry    Chief Complaint  Patient presents with  . Acute Visit    sinus congestion     HPI:  He is complaining of sinus congestion with cough has no sputum production; has no post nasal drip and has no shortness of breath present. There are no reports of fever present. There are no reports of changes in appetite.    Past Medical History  Diagnosis Date  . Depression   . Diabetes mellitus   . COPD (chronic obstructive pulmonary disease) ([place])   . Adhesive capsulitis   . MRSA bacteremia     2011 - possible endocarditis, received 6 weeks IV treatment  . Hyperlipidemia   . Anxiety   . GERD (gastroesophageal reflux disease)   . CAD (coronary artery disease)   . 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 CVA (cerebral infarction) Questionable history  . Diverticulosis   . Osteomyelitis ([place]) 2012    Sternoclavicular joint   . Chronic back pain   . Angina   . Myocardial infarction ([place])   . CHF (congestive heart failure) ([place])   . Shortness of breath   . Sleep apnea   . Chronic kidney disease     hx of BPH  . Neuromuscular disorder ([place])     HX of diabetic periferal neuropathy  . Hypertension   . Peripheral vascular disease ([place])   . On home oxygen therapy     "1.5L prn" (04/12/2014)  . Falls frequently 06/2014    Past Surgical History  Procedure Laterality Date  . Septic arthritis      Removal of infected CABG wire by Dr Arlyce Dice - 2011  . Coronary artery bypass graft  1992  . Fracture surgery  1980s    Hip  . Spine surgery  2002    Cervical fusion vertebroplasty C3-4-5  . Coronary stent placement  1999, 2002  . Doppler echocardiography  Sept 2011    EF 50-55% with some impaired diastolic  relaxation  . Colonoscopy  2006     Multiple polyps removed, repeat in 5 years  . Right iliopopliteal bypass - [place]  . Femoral-popliteal bypass graft  2008    Left  . Cardiac catheterization  October 18, 2010    Known obstructive disease, no further blockages  . Cardiac catheterization  03/17/2005    EF 35-40%  . US echocardiography  03/04/2006    EF 50-55%  . Amputation  03/16/2011    Procedure: AMPUTATION DIGIT;  Surgeon: Angelia Mould, MD;  Location: [facility] OR;  Service: Vascular;  Laterality: Left;  Great toe  . Hip arthroplasty Left     VITAL SIGNS BP 143/53 mmHg  Pulse 60  Ht 5\' 8"  (1.727 m)  Wt 278 lb (126.1 kg)  BMI 42.28 kg/m2  Patient's Medications  New Prescriptions   No medications on file  Previous Medications   BISOPROLOL (ZEBETA) 5 MG TABLET    Take 0.5 tablets (2.5 mg total) by mouth daily.   CEPHALEXIN (KEFLEX) 500 MG CAPSULE    Take 500 mg by mouth 2 (two) times daily. For cellulitis until 05/07/15   COLLAGENASE (SANTYL) OINTMENT  Apply 1 application topically daily.   DOCUSATE SODIUM (COLACE) 100 MG CAPSULE    Take 100 mg by mouth 2 (two) times daily.   DULOXETINE (CYMBALTA) 60 MG CAPSULE    Take 60 mg by mouth daily.   HYDROCODONE-ACETAMINOPHEN (NORCO) 7.5-325 MG TABLET    Take one tablet by mouth at bedtime and take one tablet by mouth once daily prior to therapy and take one tablet by mouth three times daily as needed for pain. Not to exceed 3000mg  of APAP form all sources/24hr from all sources   INSULIN ASPART (NOVOLOG) 100 UNIT/ML INJECTION    Per sliding scale 0-149 = 0 units, 150 + = 5 units   INSULIN LISPRO (HUMALOG KWIKPEN) 100 UNIT/ML KIWKPEN    Inject 8 Units into the skin 3 (three) times daily before meals.   INSULIN NPH HUMAN (HUMULIN N,NOVOLIN N) 100 UNIT/ML INJECTION    Inject 0.22 mLs (22 Units total) into the skin 2 (two) times daily before a meal.   LIDOCAINE (LIDODERM) 5 %    Apply to shoulder topically two times daily for pain. Apply  in the am and remove in pm. Remove & Discard patch within 12 hours or as directed by MD   LISINOPRIL (PRINIVIL,ZESTRIL) 2.5 MG TABLET    Take 2.5 mg by mouth daily.    LORAZEPAM (ATIVAN) 0.5 MG TABLET    Take 0.5 tablets (0.25 mg total) by mouth at bedtime.   MECLIZINE (ANTIVERT) 25 MG TABLET    Take 1 tablet (25 mg total) by mouth 3 (three) times daily as needed for dizziness.   NALOXEGOL OXALATE (MOVANTIK) 25 MG TABS TABLET    Take 25 mg by mouth daily.   NAPROXEN SODIUM (ANAPROX) 220 MG TABLET    Take 440 mg by mouth 2 (two) times daily with a meal.   NITROGLYCERIN (NITROSTAT) 0.4 MG SL TABLET    Place 1 tablet (0.4 mg total) under the tongue as needed. For chest pains   OMEPRAZOLE (PRILOSEC) 40 MG CAPSULE    Take 1 capsule (40 mg total) by mouth daily.   PROPYLENE GLYCOL (SYSTANE BALANCE) 0.6 % SOLN    Apply 1 drop to eye 2 (two) times daily. Both eyes   SACCHAROMYCES BOULARDII (FLORASTOR PO)    Take by mouth. 100000 million units by mouth two times a day for cellulitis until 05/07/15   SIMVASTATIN (ZOCOR) 80 MG TABLET    Take 1 tablet (80 mg total) by mouth daily.   TAMSULOSIN (FLOMAX) 0.4 MG CAPS CAPSULE    Take 1 capsule (0.4 mg total) by mouth daily after supper.   TORSEMIDE (DEMADEX) 20 MG TABLET    Take 1 tablet (20 mg total) by mouth 2 (two) times daily.   TRAZODONE (DESYREL) 100 MG TABLET    Take 50 mg by mouth at bedtime.   VITAMIN D, ERGOCALCIFEROL, (DRISDOL) 50000 UNITS CAPS CAPSULE    Take 1 capsule (50,000 Units total) by mouth every 7 (seven) days.   WARFARIN SODIUM (COUMADIN PO)    Take 4.5 mg by mouth daily.  Modified Medications   No medications on file  Discontinued Medications   No medications on file     SIGNIFICANT DIAGNOSTIC EXAMS   09-04-14: bilateral hip and pelvic x-ray: Left hip arthroplasty with mild acetabular protrusio. No fracture or dislocation is seen. No interval change.  09-04-14: right femur x-ray: No fracture or dislocation is seen.   09-04-14: left  femur x-ray: No acute abnormality.  09-13-14: left hip  and pelvic x-ray: No acute bony abnormality.   09-13-14: right shoulder x-ray: Advanced degenerative change in the shoulder joint. Negative for Fracture.  12-22-14: right hand and right wrist x-ray: no acute process; right hand osteoarthritis present.     LABS REVIEWED:   05-10-14: hgb a1c 7.4 09-13-14: wbc 10.4; hgb 13.3; hct 41.7; mcv 81.8; plt 188; glucose 226; bun 23; creat 1.12; k+4.2; na++138  09-18-14: inr 2.55: coumadin 5 mg daily  11-06-14: inr 1.9  12-25-14: inr 3.03  02-23-15: tsh 4.032; vit d 11  03-08-15: wbc 7.7; hgb 11.5; hct 35.7; mcv 79.7; plt 156; glucose 115; bun 19; creat 0.96; k+ 4.6; na++146; liver normal albumin 3.4 INR 1.66      Review of Systems Constitutional: Negative for malaise/fatigue.  Respiratory: has cough no shortness of breath present.    Cardiovascular: Negative for chest pain, palpitations and leg swelling.  Gastrointestinal: Negative for heartburn, abdominal pain and constipation.  Musculoskeletal:  Negative for joint pain or muscle pain .  Skin: Negative.   Neurological: Negative for headaches.  Psychiatric/Behavioral: has depression. The patient is not nervous/anxious    Physical Exam Constitutional: He is oriented to person, place, and time. No distress.  Obese   Neck: Neck supple. No JVD present. No thyromegaly present.  Cardiovascular: Normal rate, regular rhythm and intact distal pulses.   Respiratory: Effort normal and breath sounds normal. No respiratory distress.  GI: Soft. Bowel sounds are normal. He exhibits no distension. There is no tenderness.  Musculoskeletal:  Is able to move all extremities 1+ lower extremity edema   Neurological: He is alert and oriented to person, place, and time.  Skin: Skin is warm and dry. He is not diaphoretic.  Lower extremities discolored   Psychiatric: He has a normal mood and affect.       ASSESSMENT/ PLAN:  1. Allergic rhinitis with  sinus congestion: will being claritin 10 mg daily for 2 weeks then prn; mucinex 600 mg twice daily for 2 weeks then prn and flonase nightly for 3 weeks then daily as needed     Ok Edwards NP [facility]  Contact [phone] Monday through Friday 8am- 5pm  After hours call [phone]

## 2015-05-22 NOTE — Progress Notes (Signed)
Patient ID: [name], male   DOB: 07/02/1940, 75 y.o.   MRN: [id number]    Facility: [facility]       Allergies  Allergen Reactions  . Vancomycin Other (See Comments)    "red man syndrome"  . Diazepam Anxiety    REACTION: makes patient cry    Chief Complaint  Patient presents with  . Acute Visit    left lower leg wound     HPI:  He has 3 small wounds on his left lower leg which is hot and inflamed. There is slough present in the wound beds. He is concerned that there is infection developing. There are no reports of fever present. He does tell me that his leg is bothering him.   Past Medical History  Diagnosis Date  . Depression   . Diabetes mellitus   . COPD (chronic obstructive pulmonary disease) ([place])   . Adhesive capsulitis   . MRSA bacteremia     2011 - possible endocarditis, received 6 weeks IV treatment  . Hyperlipidemia   . Anxiety   . GERD (gastroesophageal reflux disease)   . CAD (coronary artery disease)   . 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 CVA (cerebral infarction) Questionable history  . Diverticulosis   . Osteomyelitis ([place]) 2012    Sternoclavicular joint   . Chronic back pain   . Angina   . Myocardial infarction ([place])   . CHF (congestive heart failure) ([place])   . Shortness of breath   . Sleep apnea   . Chronic kidney disease     hx of BPH  . Neuromuscular disorder ([place])     HX of diabetic periferal neuropathy  . Hypertension   . Peripheral vascular disease ([place])   . On home oxygen therapy     "1.5L prn" (04/12/2014)  . Falls frequently 06/2014    Past Surgical History  Procedure Laterality Date  . Septic arthritis      Removal of infected CABG wire by Dr Arlyce Dice - 2011  . Coronary artery bypass graft  1992  . Fracture surgery  1980s    Hip  . Spine surgery  2002    Cervical fusion vertebroplasty C3-4-5  . Coronary stent placement  1999, 2002  . Doppler echocardiography  Sept 2011    EF 50-55% with  some impaired diastolic relaxation  . Colonoscopy  2006     Multiple polyps removed, repeat in 5 years  . Right iliopopliteal bypass - [place]  . Femoral-popliteal bypass graft  2008    Left  . Cardiac catheterization  October 18, 2010    Known obstructive disease, no further blockages  . Cardiac catheterization  03/17/2005    EF 35-40%  . US echocardiography  03/04/2006    EF 50-55%  . Amputation  03/16/2011    Procedure: AMPUTATION DIGIT;  Surgeon: Angelia Mould, MD;  Location: [facility] OR;  Service: Vascular;  Laterality: Left;  Great toe  . Hip arthroplasty Left     VITAL SIGNS BP 132/78 mmHg  Pulse 80  Ht 5\' 8"  (1.727 m)  Wt 278 lb (126.1 kg)  BMI 42.28 kg/m2  Patient's Medications  New Prescriptions   No medications on file  Previous Medications   BISOPROLOL (ZEBETA) 5 MG TABLET    Take 0.5 tablets (2.5 mg total) by mouth daily.   CEPHALEXIN (KEFLEX) 500 MG CAPSULE    Take 500 mg by mouth 2 (two) times  daily. For cellulitis until 05/07/15   COLLAGENASE (SANTYL) OINTMENT    Apply 1 application topically daily.   DOCUSATE SODIUM (COLACE) 100 MG CAPSULE    Take 100 mg by mouth 2 (two) times daily.   DULOXETINE (CYMBALTA) 60 MG CAPSULE    Take 60 mg by mouth daily.   HYDROCODONE-ACETAMINOPHEN (NORCO) 7.5-325 MG TABLET    Take one tablet by mouth at bedtime and take one tablet by mouth once daily prior to therapy and take one tablet by mouth three times daily as needed for pain. Not to exceed 3000mg  of APAP form all sources/24hr from all sources   INSULIN ASPART (NOVOLOG) 100 UNIT/ML INJECTION    Per sliding scale 0-149 = 0 units, 150 + = 5 units   INSULIN LISPRO (HUMALOG KWIKPEN) 100 UNIT/ML KIWKPEN    Inject 8 Units into the skin 3 (three) times daily before meals.   INSULIN NPH HUMAN (HUMULIN N,NOVOLIN N) 100 UNIT/ML INJECTION    Inject 0.22 mLs (22 Units total) into the skin 2 (two) times daily before a meal.   LIDOCAINE (LIDODERM) 5 %    Apply to shoulder topically two  times daily for pain. Apply in the am and remove in pm. Remove & Discard patch within 12 hours or as directed by MD   LISINOPRIL (PRINIVIL,ZESTRIL) 2.5 MG TABLET    Take 2.5 mg by mouth daily.    LORAZEPAM (ATIVAN) 0.5 MG TABLET    Take 0.5 tablets (0.25 mg total) by mouth at bedtime.   MECLIZINE (ANTIVERT) 25 MG TABLET    Take 1 tablet (25 mg total) by mouth 3 (three) times daily as needed for dizziness.   NALOXEGOL OXALATE (MOVANTIK) 25 MG TABS TABLET    Take 25 mg by mouth daily.   NAPROXEN SODIUM (ANAPROX) 220 MG TABLET    Take 440 mg by mouth 2 (two) times daily with a meal.   NITROGLYCERIN (NITROSTAT) 0.4 MG SL TABLET    Place 1 tablet (0.4 mg total) under the tongue as needed. For chest pains   OMEPRAZOLE (PRILOSEC) 40 MG CAPSULE    Take 1 capsule (40 mg total) by mouth daily.   PROPYLENE GLYCOL (SYSTANE BALANCE) 0.6 % SOLN    Apply 1 drop to eye 2 (two) times daily. Both eyes   SACCHAROMYCES BOULARDII (FLORASTOR PO)    Take by mouth. 100000 million units by mouth two times a day for cellulitis until 05/07/15   SIMVASTATIN (ZOCOR) 80 MG TABLET    Take 1 tablet (80 mg total) by mouth daily.   TAMSULOSIN (FLOMAX) 0.4 MG CAPS CAPSULE    Take 1 capsule (0.4 mg total) by mouth daily after supper.   TORSEMIDE (DEMADEX) 20 MG TABLET    Take 1 tablet (20 mg total) by mouth 2 (two) times daily.   TRAZODONE (DESYREL) 100 MG TABLET    Take 50 mg by mouth at bedtime.   VITAMIN D, ERGOCALCIFEROL, (DRISDOL) 50000 UNITS CAPS CAPSULE    Take 1 capsule (50,000 Units total) by mouth every 7 (seven) days.   WARFARIN SODIUM (COUMADIN PO)    Take 4.5 mg by mouth daily.  Modified Medications   No medications on file  Discontinued Medications   No medications on file     SIGNIFICANT DIAGNOSTIC EXAMS   09-04-14: bilateral hip and pelvic x-ray: Left hip arthroplasty with mild acetabular protrusio. No fracture or dislocation is seen. No interval change.  09-04-14: right femur x-ray: No fracture or dislocation  is seen.  09-04-14: left femur x-ray: No acute abnormality.  09-13-14: left hip and pelvic x-ray: No acute bony abnormality.   09-13-14: right shoulder x-ray: Advanced degenerative change in the shoulder joint. Negative for Fracture.  12-22-14: right hand and right wrist x-ray: no acute process; right hand osteoarthritis present.     LABS REVIEWED:   05-10-14: hgb a1c 7.4 09-13-14: wbc 10.4; hgb 13.3; hct 41.7; mcv 81.8; plt 188; glucose 226; bun 23; creat 1.12; k+4.2; na++138  09-18-14: inr 2.55: coumadin 5 mg daily  11-06-14: inr 1.9  12-25-14: inr 3.03  02-23-15: tsh 4.032; vit d 11  03-08-15: wbc 7.7; hgb 11.5; hct 35.7; mcv 79.7; plt 156; glucose 115; bun 19; creat 0.96; k+ 4.6; na++146; liver normal albumin 3.4 INR 1.66  04-23-15: inr: 1.98 on 4.5 mg coumadin      Review of Systems Constitutional: Negative for malaise/fatigue.  Respiratory: no cougg no shortness of breath present.    Cardiovascular: Negative for chest pain, palpitations and leg swelling.  Gastrointestinal: Negative for heartburn, abdominal pain and constipation.  Musculoskeletal:  Negative for joint pain or muscle pain .  Skin: Negative.   Neurological: Negative for headaches.  Psychiatric/Behavioral: no depression  The patient is not nervous/anxious    Physical Exam Constitutional: He is oriented to person, place, and time. No distress.  Obese   Neck: Neck supple. No JVD present. No thyromegaly present.  Cardiovascular: Normal rate, regular rhythm and intact distal pulses.   Respiratory: Effort normal and breath sounds normal. No respiratory distress.  GI: Soft. Bowel sounds are normal. He exhibits no distension. There is no tenderness.  Musculoskeletal:  Is able to move all extremities 1+ lower extremity edema   Neurological: He is alert and oriented to person, place, and time.  Skin: Skin is warm and dry. He is not diaphoretic.  Lower extremities discolored 3 small wounds left lower leg with slough  present Psychiatric: He has a normal mood and affect.     ASSESSMENT/ PLAN:  1. Left lower leg ulcerations 2. Left lower leg cellulitis   Will begin keflex 500 mg twice daily for 2 weeks with florastor twice daily will check inr 3 times weekly while on abt.      Ok Edwards NP [facility]  Contact [phone] Monday through Friday 8am- 5pm  After hours call [phone]

## 2015-06-06 ENCOUNTER — Non-Acute Institutional Stay (SKILLED_NURSING_FACILITY): Payer: Medicare Other | Admitting: Adult Health

## 2015-06-06 ENCOUNTER — Encounter: Payer: Self-pay | Admitting: Adult Health

## 2015-06-06 DIAGNOSIS — K5903 Drug induced constipation: Secondary | ICD-10-CM

## 2015-06-06 DIAGNOSIS — K219 Gastro-esophageal reflux disease without esophagitis: Secondary | ICD-10-CM | POA: Diagnosis not present

## 2015-06-06 DIAGNOSIS — I5022 Chronic systolic (congestive) heart failure: Secondary | ICD-10-CM | POA: Diagnosis not present

## 2015-06-06 DIAGNOSIS — I10 Essential (primary) hypertension: Secondary | ICD-10-CM | POA: Diagnosis not present

## 2015-06-06 DIAGNOSIS — T402X5A Adverse effect of other opioids, initial encounter: Secondary | ICD-10-CM | POA: Diagnosis not present

## 2015-06-06 DIAGNOSIS — J449 Chronic obstructive pulmonary disease, unspecified: Secondary | ICD-10-CM | POA: Diagnosis not present

## 2015-06-06 DIAGNOSIS — Z7901 Long term (current) use of anticoagulants: Secondary | ICD-10-CM | POA: Diagnosis not present

## 2015-06-06 DIAGNOSIS — E1169 Type 2 diabetes mellitus with other specified complication: Secondary | ICD-10-CM

## 2015-06-06 DIAGNOSIS — E1149 Type 2 diabetes mellitus with other diabetic neurological complication: Secondary | ICD-10-CM | POA: Diagnosis not present

## 2015-06-06 DIAGNOSIS — E785 Hyperlipidemia, unspecified: Secondary | ICD-10-CM

## 2015-06-06 NOTE — Progress Notes (Signed)
Patient ID: [name], male   DOB: 02/15/1941, 75 y.o.   MRN: [id number]   Facility: [facility]       Allergies  Allergen Reactions  . Vancomycin Other (See Comments)    "red man syndrome"  . Diazepam Anxiety    REACTION: makes patient cry    Chief Complaint  Patient presents with  . Medical Management of Chronic Issues    Follow-up    HPI: He is a long term resident of this facility being seen for the management of his chronic illnesses.  His status is stable. He tells me that he is feeling good and has no complaints. There are no nursing concerns at this time.    Past Medical History  Diagnosis Date  . Depression   . Diabetes mellitus   . COPD (chronic obstructive pulmonary disease) ([place])   . Adhesive capsulitis   . MRSA bacteremia     2011 - possible endocarditis, received 6 weeks IV treatment  . Hyperlipidemia   . Anxiety   . GERD (gastroesophageal reflux disease)   . CAD (coronary artery disease)   . 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 CVA (cerebral infarction) Questionable history  . Diverticulosis   . Osteomyelitis ([place]) 2012    Sternoclavicular joint   . Chronic back pain   . Angina   . Myocardial infarction ([name])   . CHF (congestive heart failure) ([place])   . Shortness of breath   . Sleep apnea   . Chronic kidney disease     hx of BPH  . Neuromuscular disorder ([place])     HX of diabetic periferal neuropathy  . Hypertension   . Peripheral vascular disease ([place])   . On home oxygen therapy     "1.5L prn" (04/12/2014)  . Falls frequently 06/2014    Past Surgical History  Procedure Laterality Date  . Septic arthritis      Removal of infected CABG wire by Dr Arlyce Dice - 2011  . Coronary artery bypass graft  1992  . Fracture surgery  1980s    Hip  . Spine surgery  2002    Cervical fusion vertebroplasty C3-4-5  . Coronary stent placement  1999, 2002  . Doppler echocardiography  Sept 2011    EF 50-55% with some impaired  diastolic relaxation  . Colonoscopy  2006     Multiple polyps removed, repeat in 5 years  . Right iliopopliteal bypass - [place]  . Femoral-popliteal bypass graft  2008    Left  . Cardiac catheterization  October 18, 2010    Known obstructive disease, no further blockages  . Cardiac catheterization  03/17/2005    EF 35-40%  . US echocardiography  03/04/2006    EF 50-55%  . Amputation  03/16/2011    Procedure: AMPUTATION DIGIT;  Surgeon: Angelia Mould, MD;  Location: [facility] OR;  Service: Vascular;  Laterality: Left;  Great toe  . Hip arthroplasty Left     VITAL SIGNS BP 107/57 mmHg  Pulse 60  Temp(Src) 98 F (36.7 C) (Oral)  Resp 20  Ht 5\' 7"  (1.702 m)  Wt 278 lb (126.1 kg)  BMI 43.53 kg/m2  Patient's Medications  New Prescriptions   No medications on file  Previous Medications   BISOPROLOL (ZEBETA) 5 MG TABLET    Take 0.5 tablets (2.5 mg total) by mouth daily.   CEPHALEXIN (KEFLEX) 500 MG CAPSULE    Take 500 mg by mouth  2 (two) times daily. For cellulitis until 05/07/15   COLLAGENASE (SANTYL) OINTMENT    Apply 1 application topically daily.   DOCUSATE SODIUM (COLACE) 100 MG CAPSULE    Take 100 mg by mouth 2 (two) times daily.   DULOXETINE (CYMBALTA) 60 MG CAPSULE    Take 60 mg by mouth daily.   HYDROCODONE-ACETAMINOPHEN (NORCO) 7.5-325 MG TABLET    Take one tablet by mouth at bedtime and take one tablet by mouth once daily prior to therapy and take one tablet by mouth three times daily as needed for pain. Not to exceed 3000mg  of APAP form all sources/24hr from all sources   INSULIN ASPART (NOVOLOG) 100 UNIT/ML INJECTION    Per sliding scale 0-149 = 0 units, 150 + = 5 units   INSULIN LISPRO (HUMALOG KWIKPEN) 100 UNIT/ML KIWKPEN    Inject 8 Units into the skin 3 (three) times daily before meals.   INSULIN NPH HUMAN (HUMULIN N,NOVOLIN N) 100 UNIT/ML INJECTION    Inject 0.22 mLs (22 Units total) into the skin 2 (two) times daily before a meal.   LIDOCAINE (LIDODERM) 5 %     Apply to shoulder topically two times daily for pain. Apply in the am and remove in pm. Remove & Discard patch within 12 hours or as directed by MD   LISINOPRIL (PRINIVIL,ZESTRIL) 2.5 MG TABLET    Take 2.5 mg by mouth daily.    LORAZEPAM (ATIVAN) 0.5 MG TABLET    Take 0.5 tablets (0.25 mg total) by mouth at bedtime.   MECLIZINE (ANTIVERT) 25 MG TABLET    Take 1 tablet (25 mg total) by mouth 3 (three) times daily as needed for dizziness.   NALOXEGOL OXALATE (MOVANTIK) 25 MG TABS TABLET    Take 25 mg by mouth daily.   NAPROXEN SODIUM (ANAPROX) 220 MG TABLET    Take 440 mg by mouth 2 (two) times daily with a meal.   NITROGLYCERIN (NITROSTAT) 0.4 MG SL TABLET    Place 1 tablet (0.4 mg total) under the tongue as needed. For chest pains   OMEPRAZOLE (PRILOSEC) 40 MG CAPSULE    Take 1 capsule (40 mg total) by mouth daily.   PROPYLENE GLYCOL (SYSTANE BALANCE) 0.6 % SOLN    Apply 1 drop to eye 2 (two) times daily. Both eyes   SIMVASTATIN (ZOCOR) 80 MG TABLET    Take 1 tablet (80 mg total) by mouth daily.   TAMSULOSIN (FLOMAX) 0.4 MG CAPS CAPSULE    Take 1 capsule (0.4 mg total) by mouth daily after supper.   TORSEMIDE (DEMADEX) 20 MG TABLET    Take 1 tablet (20 mg total) by mouth 2 (two) times daily.   TRAZODONE (DESYREL) 100 MG TABLET    Take 50 mg by mouth at bedtime.   VITAMIN D, ERGOCALCIFEROL, (DRISDOL) 50000 UNITS CAPS CAPSULE    Take 1 capsule (50,000 Units total) by mouth every 7 (seven) days.   WARFARIN SODIUM (COUMADIN PO)    Take 5 mg by mouth daily.   Modified Medications   No medications on file  Discontinued Medications     SIGNIFICANT DIAGNOSTIC EXAMS 09-04-14: bilateral hip and pelvic x-ray: Left hip arthroplasty with mild acetabular protrusio. No fracture or dislocation is seen. No interval change.  09-04-14: right femur x-ray: No fracture or dislocation is seen.   09-04-14: left femur x-ray: No acute abnormality.  09-13-14: left hip and pelvic x-ray: No acute bony abnormality.     09-13-14: right shoulder x-ray: Advanced degenerative  change in the shoulder joint. Negative for Fracture.  12-22-14: right hand and right wrist x-ray: no acute process; right hand osteoarthritis present.     LABS REVIEWED:   09-13-14: wbc 10.4; hgb 13.3; hct 41.7; mcv 81.8; plt 188; glucose 226; bun 23; creat 1.12; k+4.2; na++138  09-18-14: inr 2.55: coumadin 5 mg daily  11-06-14: inr 1.9  12-25-14: inr 3.03  02-23-15: tsh 4.032; vit d 11  03-08-15: wbc 7.7; hgb 11.5; hct 35.7; mcv 79.7; plt 156; glucose 115; bun 19; creat 0.96; k+ 4.6; na++146; liver normal albumin 3.4 INR 1.66  04-04-15: glucose 136; bun 19; creat 1.02; k+ 3.7; na++142 06-04-15: INR 2.77      Review of Systems Constitutional: Negative for malaise/fatigue.  Respiratory: no cough or shortness of breath  Cardiovascular: Negative for chest pain, palpitations and leg swelling.  Gastrointestinal: Negative for heartburn, abdominal pain and constipation.  Musculoskeletal: negative for pain .  Skin: Negative.   Neurological: Negative for headaches.  Psychiatric/Behavioral: . The patient is not nervous/anxious    Physical Exam Constitutional: He is oriented to person, place, and time. No distress.  Obese   Neck: Neck supple. No JVD present. No thyromegaly present.  Cardiovascular: Normal rate, regular rhythm and intact distal pulses.   Respiratory: Effort normal and breath sounds normal. No respiratory distress.  GI: Soft. Bowel sounds are normal. He exhibits no distension. There is no tenderness.  Musculoskeletal:  Is able to move all extremities 1+ lower extremity edema   Neurological: He is alert and oriented to person, place, and time.  Skin: Skin is warm and dry. He is not diaphoretic.  Lower extremities discolored   Psychiatric: He has a normal mood and affect.       ASSESSMENT/ PLAN:  1. Chronic systolic heart failure: will continue demadex  20 mg twice daily     2. Hypertension: will continue zebeta  2.5 mg daily; lisinopril 2.5 mg daily and will monitor  3. Diabetes: his hgb a1c is 7.4; will continue novolog 8 units prior to meals with an additional 5 units  for cbg >=150; will continue  NPH  22 units twice daily with meals;   4. CAD: status post cabg and stents; no complaints of chest pain present; will continue zebeta 2.5 mg daily; and prn ntg  5. Gerd: continue prilosec 40 mg daily   6. CVA: is neurologically stable; is on long term coumadin therapy inr is 2.77   7. PE/inr management: he is presently stable; on long term coumadin therapy;  will continue coumadin 5  mg daily   8. Dyslipidemia: will continue zocor 80 mg daily   9. BPH: will continue flomax 0.4 mg daily   10. Osteoarthritis: has pain in numerous joints; will continue lidoderm patch as directed to shoulder; will continue  vicodin 7.5/325 mg prior to therapy and at night with tid prn. Will continue aleve 2 tabs twice daily for his right hand pain;   11. Anxiety: will continue trazodone 50 mg nightly   ativan 0.25 mg nightly and will monitor and  melatonin 3 mg nightly for sleep   12. Vit D def: will continue vit d 50,000 units weekly   13. 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

## 2015-06-11 LAB — POCT INR: INR: 3.1 — AB (ref 0.9–1.1)

## 2015-06-11 LAB — PROTIME-INR: PROTIME: 32.3 s — AB (ref 10.0–13.8)

## 2015-06-18 ENCOUNTER — Non-Acute Institutional Stay (SKILLED_NURSING_FACILITY): Payer: Medicare Other | Admitting: Adult Health

## 2015-06-18 ENCOUNTER — Encounter: Payer: Self-pay | Admitting: Adult Health

## 2015-06-18 DIAGNOSIS — E1142 Type 2 diabetes mellitus with diabetic polyneuropathy: Secondary | ICD-10-CM | POA: Insufficient documentation

## 2015-06-18 DIAGNOSIS — M150 Primary generalized (osteo)arthritis: Secondary | ICD-10-CM | POA: Diagnosis not present

## 2015-06-18 DIAGNOSIS — Z7901 Long term (current) use of anticoagulants: Secondary | ICD-10-CM

## 2015-06-18 DIAGNOSIS — M159 Polyosteoarthritis, unspecified: Secondary | ICD-10-CM

## 2015-06-18 DIAGNOSIS — I2782 Chronic pulmonary embolism: Secondary | ICD-10-CM | POA: Diagnosis not present

## 2015-06-18 LAB — BASIC METABOLIC PANEL: GLUCOSE: 130 mg/dL

## 2015-06-18 MED ORDER — GABAPENTIN 300 MG PO CAPS: 300.0000 mg | ORAL_CAPSULE | Freq: Every day | ORAL | Status: DC

## 2015-06-18 NOTE — Progress Notes (Signed)
Patient ID: [name], male   DOB: 1941/01/19, 75 y.o.   MRN: [id number]   Facility: [facility]       Allergies  Allergen Reactions  . Vancomycin Other (See Comments)    "red man syndrome"  . Diazepam Anxiety    REACTION: makes patient cry    Chief Complaint  Patient presents with  . Acute Visit    Leg Pain    HPI:  He is complaining of bilateral lower extremity pain. He tells me that he has pain all the time and the current regimen is ineffective for management of his pain. The best his pain is managed is 5/10. He states the pain is achy and painful in nature; but did tell me that his right leg has some tingling present.    Past Medical History  Diagnosis Date  . Depression   . Diabetes mellitus   . COPD (chronic obstructive pulmonary disease) ([place])   . Adhesive capsulitis   . MRSA bacteremia     2011 - possible endocarditis, received 6 weeks IV treatment  . Hyperlipidemia   . Anxiety   . GERD (gastroesophageal reflux disease)   . CAD (coronary artery disease)   . 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 CVA (cerebral infarction) Questionable history  . Diverticulosis   . Osteomyelitis ([place]) 2012    Sternoclavicular joint   . Chronic back pain   . Angina   . Myocardial infarction ([place])   . CHF (congestive heart failure) ([place])   . Shortness of breath   . Sleep apnea   . Chronic kidney disease     hx of BPH  . Neuromuscular disorder ([place])     HX of diabetic periferal neuropathy  . Hypertension   . Peripheral vascular disease ([place])   . On home oxygen therapy     "1.5L prn" (04/12/2014)  . Falls frequently 06/2014    Past Surgical History  Procedure Laterality Date  . Septic arthritis      Removal of infected CABG wire by Dr Arlyce Dice - 2011  . Coronary artery bypass graft  1992  . Fracture surgery  1980s    Hip  . Spine surgery  2002    Cervical fusion vertebroplasty C3-4-5  . Coronary stent placement  1999, 2002  . Doppler  echocardiography  Sept 2011    EF 50-55% with some impaired diastolic relaxation  . Colonoscopy  2006     Multiple polyps removed, repeat in 5 years  . Right iliopopliteal bypass - [place]  . Femoral-popliteal bypass graft  2008    Left  . Cardiac catheterization  October 18, 2010    Known obstructive disease, no further blockages  . Cardiac catheterization  03/17/2005    EF 35-40%  . US echocardiography  03/04/2006    EF 50-55%  . Amputation  03/16/2011    Procedure: AMPUTATION DIGIT;  Surgeon: Angelia Mould, MD;  [facility];  Service: Vascular;  Laterality: Left;  Great toe  . Hip arthroplasty Left     VITAL SIGNS BP 108/53 mmHg  Pulse 70  Temp(Src) 97.1 F (36.2 C) (Oral)  Resp 22  Ht 5\' 7"  (1.702 m)  Wt 285 lb 4 oz (129.389 kg)  BMI 44.67 kg/m2  Patient's Medications  New Prescriptions   No medications on file  Previous Medications   BISOPROLOL (ZEBETA) 5 MG TABLET    Take 0.5 tablets (2.5 mg total) by  mouth daily.   DOCUSATE SODIUM (COLACE) 100 MG CAPSULE    Take 100 mg by mouth 2 (two) times daily.   DULOXETINE (CYMBALTA) 60 MG CAPSULE    Take 60 mg by mouth daily.   HYDROCODONE-ACETAMINOPHEN (NORCO) 7.5-325 MG TABLET    Take one tablet by mouth at bedtime and take one tablet by mouth once daily prior to therapy and take one tablet by mouth three times daily as needed for pain. Not to exceed 3000mg  of APAP form all sources/24hr from all sources   INSULIN ASPART (NOVOLOG) 100 UNIT/ML INJECTION    Per sliding scale 0-149 = 0 units, 150 + = 5 units   INSULIN LISPRO (HUMALOG KWIKPEN) 100 UNIT/ML KIWKPEN    Inject 8 Units into the skin 3 (three) times daily before meals.   INSULIN NPH HUMAN (HUMULIN N,NOVOLIN N) 100 UNIT/ML INJECTION    Inject 0.22 mLs (22 Units total) into the skin 2 (two) times daily before a meal.   LIDOCAINE (LIDODERM) 5 %    Apply to shoulder topically two times daily for pain. Apply in the am and remove in pm. Remove & Discard patch within  12 hours or as directed by MD   LISINOPRIL (PRINIVIL,ZESTRIL) 2.5 MG TABLET    Take 2.5 mg by mouth daily.    LORAZEPAM (ATIVAN) 0.5 MG TABLET    Take 0.5 tablets (0.25 mg total) by mouth at bedtime.   MECLIZINE (ANTIVERT) 25 MG TABLET    Take 1 tablet (25 mg total) by mouth 3 (three) times daily as needed for dizziness.   NALOXEGOL OXALATE (MOVANTIK) 25 MG TABS TABLET    Take 25 mg by mouth daily.   NAPROXEN SODIUM (ANAPROX) 220 MG TABLET    Take 440 mg by mouth 2 (two) times daily with a meal.   NITROGLYCERIN (NITROSTAT) 0.4 MG SL TABLET    Place 1 tablet (0.4 mg total) under the tongue as needed. For chest pains   OMEPRAZOLE (PRILOSEC) 40 MG CAPSULE    Take 1 capsule (40 mg total) by mouth daily.   PROPYLENE GLYCOL (SYSTANE BALANCE) 0.6 % SOLN    Apply 1 drop to eye 2 (two) times daily. Both eyes   SIMVASTATIN (ZOCOR) 80 MG TABLET    Take 1 tablet (80 mg total) by mouth daily.   TAMSULOSIN (FLOMAX) 0.4 MG CAPS CAPSULE    Take 1 capsule (0.4 mg total) by mouth daily after supper.   TORSEMIDE (DEMADEX) 20 MG TABLET    Take 1 tablet (20 mg total) by mouth 2 (two) times daily.   TRAZODONE (DESYREL) 100 MG TABLET    Take 100 mg by mouth at bedtime.    VITAMIN D, ERGOCALCIFEROL, (DRISDOL) 50000 UNITS CAPS CAPSULE    Take 1 capsule (50,000 Units total) by mouth every 7 (seven) days.   WARFARIN SODIUM (COUMADIN PO)    Take 5 mg by mouth every other day. Alternate with 4.5 mg every other day  Modified Medications   No medications on file  Discontinued Medications   CEPHALEXIN (KEFLEX) 500 MG CAPSULE    Take 500 mg by mouth 2 (two) times daily. Reported on 06/18/2015   COLLAGENASE (SANTYL) OINTMENT    Apply 1 application topically daily. Reported on 06/18/2015     SIGNIFICANT DIAGNOSTIC EXAMS  09-04-14: bilateral hip and pelvic x-ray: Left hip arthroplasty with mild acetabular protrusio. No fracture or dislocation is seen. No interval change.  09-04-14: right femur x-ray: No fracture or dislocation  is seen.  09-04-14: left femur x-ray: No acute abnormality.  09-13-14: left hip and pelvic x-ray: No acute bony abnormality.   09-13-14: right shoulder x-ray: Advanced degenerative change in the shoulder joint. Negative for Fracture.  12-22-14: right hand and right wrist x-ray: no acute process; right hand osteoarthritis present.     LABS REVIEWED:   09-13-14: wbc 10.4; hgb 13.3; hct 41.7; mcv 81.8; plt 188; glucose 226; bun 23; creat 1.12; k+4.2; na++138  09-18-14: inr 2.55: coumadin 5 mg daily  11-06-14: inr 1.9  12-25-14: inr 3.03  02-23-15: tsh 4.032; vit d 11  03-08-15: wbc 7.7; hgb 11.5; hct 35.7; mcv 79.7; plt 156; glucose 115; bun 19; creat 0.96; k+ 4.6; na++146; liver normal albumin 3.4 INR 1.66  04-04-15: glucose 136; bun 19; creat 1.02; k+ 3.7; na++142 06-04-15: INR 2.77  06-18-15: INR 2.41      Review of Systems Constitutional: Negative for malaise/fatigue.  Respiratory: no cough or shortness of breath  Cardiovascular: Negative for chest pain, palpitations and leg swelling.  Gastrointestinal: Negative for heartburn, abdominal pain and constipation.  Musculoskeletal: bilateral leg pain.  Skin: Negative.   Neurological: Negative for headaches.  Psychiatric/Behavioral: . The patient is not nervous/anxious    Physical Exam Constitutional: He is oriented to person, place, and time. No distress.  Obese   Neck: Neck supple. No JVD present. No thyromegaly present.  Cardiovascular: Normal rate, regular rhythm and intact distal pulses.   Respiratory: Effort normal and breath sounds normal. No respiratory distress.  GI: Soft. Bowel sounds are normal. He exhibits no distension. There is no tenderness.  Musculoskeletal:  Is able to move all extremities 1+ lower extremity edema   Neurological: He is alert and oriented to person, place, and time.  Skin: Skin is warm and dry. He is not diaphoretic.  Lower extremities discolored Coban dressing to right lower leg    Psychiatric:  He has a normal mood and affect.       ASSESSMENT/ PLAN:  1. PE/inr management: he is presently stable; on long term coumadin therapy;  will continue coumadin 5 mg alternating with 4.5  daily   2. Osteoarthritis: has pain in numerous joints; will continue lidoderm patch as directed to shoulder; will continue  vicodin 7.5/325 mg prior to therapy and at night with tid prn. Will continue aleve 2 tabs twice daily for his right hand pain;   3. 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    Will check cbc; cmp; lipids and hgb a1c urine for micro-albumin    Ok Edwards NP [facility]  Contact [phone] Monday through Friday 8am- 5pm  After hours call [phone]

## 2015-07-09 LAB — POCT INR: INR: 1.8 — AB (ref 0.9–1.1)

## 2015-07-09 LAB — PROTIME-INR: Protime: 21 seconds — AB (ref 10.0–13.8)

## 2015-07-12 ENCOUNTER — Non-Acute Institutional Stay (SKILLED_NURSING_FACILITY): Payer: Medicare Other | Admitting: Adult Health

## 2015-07-12 ENCOUNTER — Encounter: Payer: Self-pay | Admitting: Adult Health

## 2015-07-12 DIAGNOSIS — I10 Essential (primary) hypertension: Secondary | ICD-10-CM

## 2015-07-12 DIAGNOSIS — I5022 Chronic systolic (congestive) heart failure: Secondary | ICD-10-CM

## 2015-07-12 DIAGNOSIS — J449 Chronic obstructive pulmonary disease, unspecified: Secondary | ICD-10-CM | POA: Diagnosis not present

## 2015-07-12 DIAGNOSIS — K5903 Drug induced constipation: Secondary | ICD-10-CM | POA: Diagnosis not present

## 2015-07-12 DIAGNOSIS — E1169 Type 2 diabetes mellitus with other specified complication: Secondary | ICD-10-CM | POA: Diagnosis not present

## 2015-07-12 DIAGNOSIS — Z7901 Long term (current) use of anticoagulants: Secondary | ICD-10-CM

## 2015-07-12 DIAGNOSIS — F329 Major depressive disorder, single episode, unspecified: Secondary | ICD-10-CM | POA: Diagnosis not present

## 2015-07-12 DIAGNOSIS — E785 Hyperlipidemia, unspecified: Secondary | ICD-10-CM

## 2015-07-12 DIAGNOSIS — I2782 Chronic pulmonary embolism: Secondary | ICD-10-CM | POA: Diagnosis not present

## 2015-07-12 DIAGNOSIS — T402X5A Adverse effect of other opioids, initial encounter: Secondary | ICD-10-CM

## 2015-07-12 DIAGNOSIS — F32A Depression, unspecified: Secondary | ICD-10-CM

## 2015-07-12 DIAGNOSIS — E1149 Type 2 diabetes mellitus with other diabetic neurological complication: Secondary | ICD-10-CM | POA: Diagnosis not present

## 2015-07-12 DIAGNOSIS — E1142 Type 2 diabetes mellitus with diabetic polyneuropathy: Secondary | ICD-10-CM

## 2015-07-12 NOTE — Progress Notes (Signed)
Patient ID: [name], male   DOB: 10-09-1940, 75 y.o.   MRN: [id number]   Facility: [facility]       Allergies  Allergen Reactions  . Vancomycin Other (See Comments)    "red man syndrome"  . Diazepam Anxiety    REACTION: makes patient cry    Chief Complaint  Patient presents with  . Medical Management of Chronic Issues    Follow up    HPI:  He is a long term resident of this facility being seen for the management of his chronic illnesses. He continues to have issues with pain management and depression.  He is able to go sit outside with his wife on the front porch. There are no nursing concerns at this time.    Past Medical History  Diagnosis Date  . Depression   . Diabetes mellitus   . COPD (chronic obstructive pulmonary disease) ([place])   . Adhesive capsulitis   . MRSA bacteremia     2011 - possible endocarditis, received 6 weeks IV treatment  . Hyperlipidemia   . Anxiety   . GERD (gastroesophageal reflux disease)   . CAD (coronary artery disease)   . 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 CVA (cerebral infarction) Questionable history  . Diverticulosis   . Osteomyelitis ([place]) 2012    Sternoclavicular joint   . Chronic back pain   . Angina   . Myocardial infarction ([place])   . CHF (congestive heart failure) ([place])   . Shortness of breath   . Sleep apnea   . Chronic kidney disease     hx of BPH  . Neuromuscular disorder ([place])     HX of diabetic periferal neuropathy  . Hypertension   . Peripheral vascular disease ([place])   . On home oxygen therapy     "1.5L prn" (04/12/2014)  . Falls frequently 06/2014    Past Surgical History  Procedure Laterality Date  . Septic arthritis      Removal of infected CABG wire by Dr Arlyce Dice - 2011  . Coronary artery bypass graft  1992  . Fracture surgery  1980s    Hip  . Spine surgery  2002    Cervical fusion vertebroplasty C3-4-5  . Coronary stent placement  1999, 2002  . Doppler  echocardiography  Sept 2011    EF 50-55% with some impaired diastolic relaxation  . Colonoscopy  2006     Multiple polyps removed, repeat in 5 years  . Right iliopopliteal bypass - [place]  . Femoral-popliteal bypass graft  2008    Left  . Cardiac catheterization  October 18, 2010    Known obstructive disease, no further blockages  . Cardiac catheterization  03/17/2005    EF 35-40%  . US echocardiography  03/04/2006    EF 50-55%  . Amputation  03/16/2011    Procedure: AMPUTATION DIGIT;  Surgeon: Angelia Mould, MD;  Location: [facility] OR;  Service: Vascular;  Laterality: Left;  Great toe  . Hip arthroplasty Left     VITAL SIGNS BP 126/70 mmHg  Pulse 86  Temp(Src) 98.2 F (36.8 C) (Oral)  Resp 18  Ht 5\' 7"  (1.702 m)  Wt 282 lb (127.914 kg)  BMI 44.16 kg/m2  SpO2 98%  Patient's Medications  New Prescriptions   No medications on file  Previous Medications   BISOPROLOL (ZEBETA) 5 MG TABLET    Take 0.5 tablets (2.5 mg total) by mouth daily.  DOCUSATE SODIUM (COLACE) 100 MG CAPSULE    Take 100 mg by mouth 2 (two) times daily.   DULOXETINE (CYMBALTA) 60 MG CAPSULE    Take 60 mg by mouth daily.   GABAPENTIN (NEURONTIN) 300 MG CAPSULE    Take 1 capsule (300 mg total) by mouth at bedtime.   HYDROCODONE-ACETAMINOPHEN (NORCO) 7.5-325 MG TABLET    Take one tablet by mouth at bedtime and take one tablet by mouth once daily prior to therapy and take one tablet by mouth three times daily as needed for pain. Not to exceed 3000mg  of APAP form all sources/24hr from all sources   INSULIN ASPART (NOVOLOG) 100 UNIT/ML INJECTION    Per sliding scale 0-149 = 0 units, 150 + = 5 units   INSULIN LISPRO (HUMALOG KWIKPEN) 100 UNIT/ML KIWKPEN    Inject 8 Units into the skin 3 (three) times daily before meals.   INSULIN NPH HUMAN (HUMULIN N,NOVOLIN N) 100 UNIT/ML INJECTION    Inject 0.22 mLs (22 Units total) into the skin 2 (two) times daily before a meal.   LIDOCAINE (LIDODERM) 5 %    Apply to shoulder  topically two times daily for pain. Apply in the am and remove in pm. Remove & Discard patch within 12 hours or as directed by MD   LISINOPRIL (PRINIVIL,ZESTRIL) 2.5 MG TABLET    Take 2.5 mg by mouth daily.    LORAZEPAM (ATIVAN) 0.5 MG TABLET    Take 0.5 tablets (0.25 mg total) by mouth at bedtime.   MECLIZINE (ANTIVERT) 25 MG TABLET    Take 1 tablet (25 mg total) by mouth 3 (three) times daily as needed for dizziness.   NALOXEGOL OXALATE (MOVANTIK) 25 MG TABS TABLET    Take 25 mg by mouth daily.   NAPROXEN SODIUM (ANAPROX) 220 MG TABLET    Take 440 mg by mouth 2 (two) times daily with a meal.   NITROGLYCERIN (NITROSTAT) 0.4 MG SL TABLET    Place 1 tablet (0.4 mg total) under the tongue as needed. For chest pains   OMEPRAZOLE (PRILOSEC) 40 MG CAPSULE    Take 1 capsule (40 mg total) by mouth daily.   PROPYLENE GLYCOL (SYSTANE BALANCE) 0.6 % SOLN    Apply 1 drop to eye 2 (two) times daily. Both eyes   SIMVASTATIN (ZOCOR) 80 MG TABLET    Take 1 tablet (80 mg total) by mouth daily.   TAMSULOSIN (FLOMAX) 0.4 MG CAPS CAPSULE    Take 1 capsule (0.4 mg total) by mouth daily after supper.   TORSEMIDE (DEMADEX) 20 MG TABLET    Take 1 tablet (20 mg total) by mouth 2 (two) times daily.   TRAZODONE (DESYREL) 100 MG TABLET    Take 100 mg by mouth at bedtime.    VITAMIN D, ERGOCALCIFEROL, (DRISDOL) 50000 UNITS CAPS CAPSULE    Take 1 capsule (50,000 Units total) by mouth every 7 (seven) days.   WARFARIN SODIUM (COUMADIN PO)    Take 5 mg by mouth every other day.    WOUND DRESSINGS (MEDIHONEY WOUND/BURN DRESSING EX)    Apply topically as needed.  Modified Medications   No medications on file  Discontinued Medications   No medications on file     SIGNIFICANT DIAGNOSTIC EXAMS  09-04-14: bilateral hip and pelvic x-ray: Left hip arthroplasty with mild acetabular protrusio. No fracture or dislocation is seen. No interval change.  09-04-14: right femur x-ray: No fracture or dislocation is seen.   09-04-14: left  femur x-ray: No  acute abnormality.  09-13-14: left hip and pelvic x-ray: No acute bony abnormality.   09-13-14: right shoulder x-ray: Advanced degenerative change in the shoulder joint. Negative for Fracture.  12-22-14: right hand and right wrist x-ray: no acute process; right hand osteoarthritis present.     LABS REVIEWED:   09-13-14: wbc 10.4; hgb 13.3; hct 41.7; mcv 81.8; plt 188; glucose 226; bun 23; creat 1.12; k+4.2; na++138  09-18-14: inr 2.55: coumadin 5 mg daily  11-06-14: inr 1.9  12-25-14: inr 3.03  02-23-15: tsh 4.032; vit d 11  03-08-15: wbc 7.7; hgb 11.5; hct 35.7; mcv 79.7; plt 156; glucose 115; bun 19; creat 0.96; k+ 4.6; na++146; liver normal albumin 3.4 INR 1.66  04-04-15: glucose 136; bun 19; creat 1.02; k+ 3.7; na++142 06-04-15: INR 2.77  06-18-15: INR 2.41  06-25-15: wbc 8.0; hgb 12.1; hct 37.9 ;mcv 78.6; plt 159; glucose 122; bun 20; creat 0.93; k+ 4.3; na++142 liver normal albumin 3.6; chol 151; ldl 87; trig 162; hdl 32; hgb a1c 7.4     Review of Systems Constitutional: Negative for malaise/fatigue.  Respiratory: no cough or shortness of breath  Cardiovascular: Negative for chest pain, palpitations and leg swelling.  Gastrointestinal: Negative for heartburn, abdominal pain and constipation.  Musculoskeletal: bilateral leg pain.  Skin: Negative.   Neurological: Negative for headaches.  Psychiatric/Behavioral: . The patient is not nervous/anxious    Physical Exam Constitutional: He is oriented to person, place, and time. No distress.  Obese   Neck: Neck supple. No JVD present. No thyromegaly present.  Cardiovascular: Normal rate, regular rhythm and intact distal pulses.   Respiratory: Effort normal and breath sounds normal. No respiratory distress.  GI: Soft. Bowel sounds are normal. He exhibits no distension. There is no tenderness.  Musculoskeletal:  Is able to move all extremities 1+ lower extremity edema   Neurological: He is alert and oriented to person,  place, and time.  Skin: Skin is warm and dry. He is not diaphoretic.  Lower extremities discolored Coban dressing to right lower leg    Psychiatric: He has a normal mood and affect.       ASSESSMENT/ PLAN:  1. Chronic systolic heart failure: will continue demadex  20 mg twice daily     2. Hypertension: will continue zebeta 2.5 mg daily; lisinopril 2.5 mg daily and will monitor  3. Diabetes: his hgb a1c is 7.4; will continue novolog 8 units prior to meals with an additional 5 units  for cbg >=150; will continue  NPH  22 units twice daily with meals;   4. CAD: status post cabg and stents; no complaints of chest pain present; will continue zebeta 2.5 mg daily; and prn ntg  5. Gerd: continue prilosec 40 mg daily   6. CVA: is neurologically stable; is on long term coumadin therapy    7. PE/inr management: he is presently stable; on long term coumadin therapy;  will continue coumadin    8. Dyslipidemia: will continue zocor 80 mg daily ldl is 87  9. BPH: will continue flomax 0.4 mg daily   10. Osteoarthritis: has pain in numerous joints; will continue lidoderm patch as directed to shoulder; will continue  vicodin 7.5/325 mg prior to therapy and at night with tid prn. Will continue aleve 2 tabs twice daily for his right hand pain;   11. Anxiety: will continue trazodone 50 mg nightly   ativan 0.25 mg nightly and will monitor and  melatonin 3 mg nightly for sleep  Will increase cymbalta to  90 mg daily also takes this medication for pain management.   12. Vit D def: will continue vit d 50,000 units weekly   13. 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 Diabetic peripheral neuropathy: will increase his neurontin to 300 mg twice daily and will increase cymbalta to 90 mg daily and will monitor his status.    Will get urine for micro-albumin      Ok Edwards NP [facility]  Contact [phone] Monday through Friday 8am- 5pm    After hours call [phone]

## 2015-07-20 ENCOUNTER — Other Ambulatory Visit: Payer: Self-pay | Admitting: *Deleted

## 2015-07-20 DIAGNOSIS — I7025 Atherosclerosis of native arteries of other extremities with ulceration: Secondary | ICD-10-CM

## 2015-07-23 ENCOUNTER — Non-Acute Institutional Stay (SKILLED_NURSING_FACILITY): Payer: Medicare Other | Admitting: Adult Health

## 2015-07-23 ENCOUNTER — Encounter: Payer: Self-pay | Admitting: Adult Health

## 2015-07-23 DIAGNOSIS — E1142 Type 2 diabetes mellitus with diabetic polyneuropathy: Secondary | ICD-10-CM

## 2015-07-23 DIAGNOSIS — M150 Primary generalized (osteo)arthritis: Secondary | ICD-10-CM | POA: Diagnosis not present

## 2015-07-23 DIAGNOSIS — M545 Low back pain, unspecified: Secondary | ICD-10-CM | POA: Insufficient documentation

## 2015-07-23 DIAGNOSIS — M159 Polyosteoarthritis, unspecified: Secondary | ICD-10-CM

## 2015-07-23 NOTE — Progress Notes (Signed)
Patient ID: [name], male   DOB: March 15, 1941, 75 y.o.   MRN: [id number]   Facility: [facility]     CODE STATUS: DNR  Allergies  Allergen Reactions  . Vancomycin Other (See Comments)    "red man syndrome"  . Diazepam Anxiety    REACTION: makes patient cry    Chief Complaint  Patient presents with  . Acute Visit    Pain Management    HPI:  He is having pain in bilateral lower legs; right knee and left hip. He is having lower back spasms as well.  He had a fall over this past weekend without obvious injury at this time. He states he is unable to get out of bed today due to his pain. He would like his pain more effectively treated. I have left a message for his wife.   Social History   Social History  . Marital Status: Married    Spouse Name: Butch Penny  . Number of Children: 3  . Years of Education: 12   Occupational History  . Retired-machinest/welder    Social History Main Topics  . Smoking status: Former Smoker    Quit date: 06/19/2000  . Smokeless tobacco: Never Used  . Alcohol Use: No  . Drug Use: No  . Sexual Activity: No   Other Topics Concern  . Not on file   Social History Narrative   Health Care POA: TBD   Emergency Contact: wife, Butch Penny [phone]   End of Life Plan: pt does not have AD and not interested.   Who lives with you: Butch Penny and step-daughter-Betty   Any pets: none   Diet: Pt has a varied diet of protein, starch and vegetables.   Exercise: Pt does not have regular exercise routine.   Seatbelts: Pt reports wearing seatbelt when in vehicles.    Hobbies: listening to car races.             Family History  Problem Relation Age of Onset  . Heart disease Father   . Heart disease Mother     Past Medical History  Diagnosis Date  . Depression   . Diabetes mellitus   . COPD (chronic obstructive pulmonary disease) ([place])   . Adhesive capsulitis   . MRSA bacteremia     2011 - possible endocarditis, received 6 weeks IV treatment  .  Hyperlipidemia   . Anxiety   . GERD (gastroesophageal reflux disease)   . CAD (coronary artery disease)   . 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 CVA (cerebral infarction) Questionable history  . Diverticulosis   . Osteomyelitis ([place]) 2012    Sternoclavicular joint   . Chronic back pain   . Angina   . Myocardial infarction ([place])   . CHF (congestive heart failure) ([place])   . Shortness of breath   . Sleep apnea   . Chronic kidney disease     hx of BPH  . Neuromuscular disorder ([place])     HX of diabetic periferal neuropathy  . Hypertension   . Peripheral vascular disease ([place])   . On home oxygen therapy     "1.5L prn" (04/12/2014)  . Falls frequently 06/2014    Past Surgical History  Procedure Laterality Date  . Septic arthritis      Removal of infected CABG wire by Dr Arlyce Dice - 2011  . Coronary artery bypass graft  1992  . Fracture surgery  1980s    Hip  . Spine  surgery  2002    Cervical fusion vertebroplasty C3-4-5  . Coronary stent placement  1999, 2002  . Doppler echocardiography  Sept 2011    EF 50-55% with some impaired diastolic relaxation  . Colonoscopy  2006     Multiple polyps removed, repeat in 5 years  . Right iliopopliteal bypass - [place]  . Femoral-popliteal bypass graft  2008    Left  . Cardiac catheterization  October 18, 2010    Known obstructive disease, no further blockages  . Cardiac catheterization  03/17/2005    EF 35-40%  . US echocardiography  03/04/2006    EF 50-55%  . 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 Hip arthroplasty Left     VITAL SIGNS BP 115/70 mmHg  Pulse 96  Temp(Src) 97.5 F (36.4 C) (Oral)  Resp 18  Ht 5\' 7"  (1.702 m)  Wt 282 lb 1 oz (127.943 kg)  BMI 44.17 kg/m2  SpO2 96%  Patient's Medications  New Prescriptions   No medications on file  Previous Medications    BISOPROLOL (ZEBETA) 5 MG TABLET    Take 0.5 tablets (2.5 mg total) by mouth daily.   DOCUSATE SODIUM (COLACE) 100 MG CAPSULE    Take 100 mg by mouth 2 (two) times daily.   DULOXETINE (CYMBALTA) 30 MG CAPSULE    Take 90 mg by mouth daily.   GABAPENTIN (NEURONTIN) 300 MG CAPSULE    Take 1 capsule (300 mg total) by mouth at bedtime.   HYDROCODONE-ACETAMINOPHEN (NORCO) 7.5-325 MG TABLET    Take one tablet by mouth at bedtime and take one tablet by mouth once daily prior to therapy and take one tablet by mouth three times daily as needed for pain. Not to exceed 3000mg  of APAP form all sources/24hr from all sources   INSULIN LISPRO (HUMALOG KWIKPEN) 100 UNIT/ML KIWKPEN    Inject 8 Units into the skin 3 (three) times daily before meals.   INSULIN NPH HUMAN (HUMULIN N,NOVOLIN N) 100 UNIT/ML INJECTION    Inject 0.22 mLs (22 Units total) into the skin 2 (two) times daily before a meal.   LIDOCAINE (LIDODERM) 5 %    Apply to shoulder topically two times daily for pain. Apply in the am and remove in pm. Remove & Discard patch within 12 hours or as directed by MD   LISINOPRIL (PRINIVIL,ZESTRIL) 2.5 MG TABLET    Take 2.5 mg by mouth daily.    LORAZEPAM (ATIVAN) 0.5 MG TABLET    Take 0.5 tablets (0.25 mg total) by mouth at bedtime.   MECLIZINE (ANTIVERT) 25 MG TABLET    Take 1 tablet (25 mg total) by mouth 3 (three) times daily as needed for dizziness.   NALOXEGOL OXALATE (MOVANTIK) 25 MG TABS TABLET    Take 25 mg by mouth daily.   NAPROXEN SODIUM (ANAPROX) 220 MG TABLET    Take 440 mg by mouth 2 (two) times daily with a meal.   NITROGLYCERIN (NITROSTAT) 0.4 MG SL TABLET    Place 1 tablet (0.4 mg total) under the tongue as needed. For chest pains   OMEPRAZOLE (PRILOSEC) 40 MG CAPSULE    Take 1 capsule (40 mg total) by mouth daily.   PROPYLENE GLYCOL (SYSTANE BALANCE) 0.6 % SOLN    Apply 1 drop to eye 2 (two) times daily. Both eyes   SIMVASTATIN (ZOCOR) 80  MG TABLET    Take 1 tablet (80 mg total) by mouth daily.    TAMSULOSIN (FLOMAX) 0.4 MG CAPS CAPSULE    Take 1 capsule (0.4 mg total) by mouth daily after supper.   TORSEMIDE (DEMADEX) 20 MG TABLET    Take 1 tablet (20 mg total) by mouth 2 (two) times daily.   TRAZODONE (DESYREL) 100 MG TABLET    Take 100 mg by mouth at bedtime.    VITAMIN D, ERGOCALCIFEROL, (DRISDOL) 50000 UNITS CAPS CAPSULE    Take 1 capsule (50,000 Units total) by mouth every 7 (seven) days.   WARFARIN SODIUM (COUMADIN PO)    Take 5 mg by mouth every other day.    WOUND DRESSINGS (MEDIHONEY WOUND/BURN DRESSING EX)    Apply topically as needed.  Modified Medications   No medications on file  Discontinued Medications     SIGNIFICANT DIAGNOSTIC EXAMS  09-04-14: bilateral hip and pelvic x-ray: Left hip arthroplasty with mild acetabular protrusio. No fracture or dislocation is seen. No interval change.  09-04-14: right femur x-ray: No fracture or dislocation is seen.   09-04-14: left femur x-ray: No acute abnormality.  09-13-14: left hip and pelvic x-ray: No acute bony abnormality.   09-13-14: right shoulder x-ray: Advanced degenerative change in the shoulder joint. Negative for Fracture.  12-22-14: right hand and right wrist x-ray: no acute process; right hand osteoarthritis present.     LABS REVIEWED:   09-13-14: wbc 10.4; hgb 13.3; hct 41.7; mcv 81.8; plt 188; glucose 226; bun 23; creat 1.12; k+4.2; na++138  09-18-14: inr 2.55: coumadin 5 mg daily  11-06-14: inr 1.9  12-25-14: inr 3.03  02-23-15: tsh 4.032; vit d 11  03-08-15: wbc 7.7; hgb 11.5; hct 35.7; mcv 79.7; plt 156; glucose 115; bun 19; creat 0.96; k+ 4.6; na++146; liver normal albumin 3.4 INR 1.66  04-04-15: glucose 136; bun 19; creat 1.02; k+ 3.7; na++142 06-04-15: INR 2.77  06-18-15: INR 2.41  Urine for micro-albumin 0.7  06-25-15: wbc 8.0; hgb 12.1; hct 37.9 ;mcv 78.6; plt 159; glucose 122; bun 20; creat 0.93; k+ 4.3; na++142 liver normal albumin 3.6; chol 151; ldl 87; trig 162; hdl 32; hgb a1c 7.4     Review of  Systems Constitutional: Negative for malaise/fatigue.  Respiratory: no cough or shortness of breath  Cardiovascular: Negative for chest pain, palpitations and leg swelling.  Gastrointestinal: Negative for heartburn, abdominal pain and constipation.  Musculoskeletal: bilateral leg pain.right knee left hip pain  and back spasms  Skin: Negative.   Neurological: Negative for headaches.  Psychiatric/Behavioral: . The patient is not nervous/anxious    Physical Exam Constitutional: He is oriented to person, place, and time. No distress.  Obese   Neck: Neck supple. No JVD present. No thyromegaly present.  Cardiovascular: Normal rate, regular rhythm and intact distal pulses.   Respiratory: Effort normal and breath sounds normal. No respiratory distress.  GI: Soft. Bowel sounds are normal. He exhibits no distension. There is no tenderness.  Musculoskeletal:  Is able to move all extremities 1+ lower extremity edema   Neurological: He is alert and oriented to person, place, and time.  Skin: Skin is warm and dry. He is not diaphoretic.  Lower extremities discolored Coban dressing to bilateral lower legs Right knee and bilateral lower legs very tender to touch    Psychiatric: He has a normal mood and affect.       ASSESSMENT/ PLAN:  1. Osteoarthritis: has pain in numerous joints; will continue lidoderm patch as directed  to shoulder;  Will continue cymbalta 90 mg daily will continue aleve 2 tabs twice daily   will change his vicodin 7.5/325 mg twice daily routinely and twice daily as needed; will x-ray right knee due to his fall and will monitor   2. Back pain: chronic in nature; will begin robaxin 500 mg every 6 hours and will have therapy see patient for pain management.   3. Diabetic peripheral neuropathy: will continue cymbalta  90 mg daily will increase neurontin to 300 mg twice daily for one week then 300 mg three times daily           Ok Edwards NP [facility]   Contact [phone] Monday through Friday 8am- 5pm  After hours call [phone]

## 2015-07-26 ENCOUNTER — Non-Acute Institutional Stay (SKILLED_NURSING_FACILITY): Payer: Medicare Other | Admitting: Adult Health

## 2015-07-26 DIAGNOSIS — N179 Acute kidney failure, unspecified: Secondary | ICD-10-CM

## 2015-07-26 DIAGNOSIS — J189 Pneumonia, unspecified organism: Secondary | ICD-10-CM | POA: Diagnosis not present

## 2015-07-26 LAB — BASIC METABOLIC PANEL
BUN: 37 mg/dL — AB (ref 4–21)
CREATININE: 1.6 mg/dL — AB (ref 0.6–1.3)
Glucose: 161 mg/dL
Potassium: 4.3 mmol/L (ref 3.4–5.3)
SODIUM: 136 mmol/L — AB (ref 137–147)

## 2015-07-26 LAB — CBC AND DIFFERENTIAL
HCT: 37 % — AB (ref 41–53)
Hemoglobin: 12.3 g/dL — AB (ref 13.5–17.5)
PLATELETS: 106 10*3/uL — AB (ref 150–399)
WBC: 7.7 10*3/mL

## 2015-07-27 ENCOUNTER — Encounter: Payer: Self-pay | Admitting: Vascular Surgery

## 2015-07-30 ENCOUNTER — Non-Acute Institutional Stay (SKILLED_NURSING_FACILITY): Payer: Medicare Other | Admitting: Adult Health

## 2015-07-30 ENCOUNTER — Encounter: Payer: Self-pay | Admitting: Adult Health

## 2015-07-30 DIAGNOSIS — M5441 Lumbago with sciatica, right side: Secondary | ICD-10-CM | POA: Diagnosis not present

## 2015-07-30 DIAGNOSIS — M159 Polyosteoarthritis, unspecified: Secondary | ICD-10-CM

## 2015-07-30 DIAGNOSIS — M5440 Lumbago with sciatica, unspecified side: Secondary | ICD-10-CM | POA: Insufficient documentation

## 2015-07-30 DIAGNOSIS — I2782 Chronic pulmonary embolism: Secondary | ICD-10-CM

## 2015-07-30 DIAGNOSIS — M150 Primary generalized (osteo)arthritis: Secondary | ICD-10-CM

## 2015-07-30 DIAGNOSIS — M5442 Lumbago with sciatica, left side: Secondary | ICD-10-CM

## 2015-07-30 NOTE — Progress Notes (Signed)
Patient ID: [name], male   DOB: February 08, 1941, 75 y.o.   MRN: [id number]   Facility[facility]     CODE STATUS: DNR  Allergies  Allergen Reactions  . Vancomycin Other (See Comments)    "red man syndrome"  . Diazepam Anxiety    REACTION: makes patient cry    Chief Complaint  Patient presents with  . Acute Visit    HPI:  He is presently being treated with augmentin for respiratory infection. He is off the 02 at this time. He tells me that he is feeling better. He still has issues with pain management. He has tenderness along his sciatic nerve area on both buttocks. We discussed his coumadin therapy and the difficulty managing his INR. He is willing to start zarelto instead of coumadin.   Past Medical History  Diagnosis Date  . Depression   . Diabetes mellitus   . COPD (chronic obstructive pulmonary disease) ([place])   . Adhesive capsulitis   . MRSA bacteremia     2011 - possible endocarditis, received 6 weeks IV treatment  . Hyperlipidemia   . Anxiety   . GERD (gastroesophageal reflux disease)   . CAD (coronary artery disease)   . 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 CVA (cerebral infarction) Questionable history  . Diverticulosis   . Osteomyelitis ([place]) 2012    Sternoclavicular joint   . Chronic back pain   . Angina   . Myocardial infarction ([place])   . CHF (congestive heart failure) ([place])   . Shortness of breath   . Sleep apnea   . Chronic kidney disease     hx of BPH  . Neuromuscular disorder ([place])     HX of diabetic periferal neuropathy  . Hypertension   . Peripheral vascular disease ([place])   . On home oxygen therapy     "1.5L prn" (04/12/2014)  . Falls frequently 06/2014    Past Surgical History  Procedure Laterality Date  . Septic arthritis      Removal of infected CABG wire by Dr Arlyce Dice - 2011  . Coronary artery bypass graft  1992  . Fracture surgery  1980s    Hip  . Spine surgery  2002    Cervical fusion  vertebroplasty C3-4-5  . Coronary stent placement  1999, 2002  . Doppler echocardiography  Sept 2011    EF 50-55% with some impaired diastolic relaxation  . Colonoscopy  2006     Multiple polyps removed, repeat in 5 years  . Right iliopopliteal bypass - [place]  . Femoral-popliteal bypass graft  2008    Left  . Cardiac catheterization  October 18, 2010    Known obstructive disease, no further blockages  . Cardiac catheterization  03/17/2005    EF 35-40%  . US echocardiography  03/04/2006    EF 50-55%  . 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 Hip arthroplasty Left     Social History   Social History  . Marital Status: Married    Spouse Name: Butch Penny  . Number of Children: 3  . Years of Education: 12   Occupational History  . Retired-machinest/welder    Social History Main Topics  . Smoking status: Former Smoker    Quit date: 06/19/2000  . Smokeless tobacco: Never Used  . Alcohol Use: No  .  Drug Use: No  . Sexual Activity: No   Other Topics Concern  . Not on file   Social History Narrative   Health Care POA: TBD   Emergency Contact: wife, Butch Penny [phone]   End of Life Plan: pt does not have AD and not interested.   Who lives with you: Butch Penny and step-daughter-Betty   Any pets: none   Diet: Pt has a varied diet of protein, starch and vegetables.   Exercise: Pt does not have regular exercise routine.   Seatbelts: Pt reports wearing seatbelt when in vehicles.    Hobbies: listening to car races.             Family History  Problem Relation Age of Onset  . Heart disease Father   . Heart disease Mother       VITAL SIGNS BP 94/52 mmHg  Pulse 63  Temp(Src) 96.9 F (36.1 C) (Oral)  Resp 18  Ht 5\' 7"  (1.702 m)  Wt 272 lb 6 oz (123.548 kg)  BMI 42.65 kg/m2  SpO2 92%  Patient's Medications  New Prescriptions   No medications on file    Previous Medications   AMOXICILLIN-CLAVULANATE (AUGMENTIN) 875-125 MG TABLET    Take 1 tablet by mouth 2 (two) times daily.   BISOPROLOL (ZEBETA) 5 MG TABLET    Take 0.5 tablets (2.5 mg total) by mouth daily.   DOCUSATE SODIUM (COLACE) 100 MG CAPSULE    Take 100 mg by mouth 2 (two) times daily.   DULOXETINE (CYMBALTA) 30 MG CAPSULE    Take 90 mg by mouth daily.   GABAPENTIN (NEURONTIN) 300 MG CAPSULE    Take 1 capsule (300 mg total) by mouth at bedtime.   HYDROCODONE-ACETAMINOPHEN (NORCO) 7.5-325 MG TABLET    Take 1 tablet by mouth every 6 hours routinely    INSULIN ASPART (NOVOLOG) 100 UNIT/ML INJECTION    Reported on 07/23/2015   INSULIN LISPRO (HUMALOG KWIKPEN) 100 UNIT/ML KIWKPEN    Inject 8 Units into the skin 3 (three) times daily before meals.   INSULIN NPH HUMAN (HUMULIN N,NOVOLIN N) 100 UNIT/ML INJECTION    Inject 0.22 mLs (22 Units total) into the skin 2 (two) times daily before a meal.   IPRATROPIUM-ALBUTEROL (DUONEB) 0.5-2.5 (3) MG/3ML SOLN    Take 3 mLs by nebulization every 6 (six) hours as needed.   LIDOCAINE (LIDODERM) 5 %    Apply to shoulder topically two times daily for pain. Apply in the am and remove in pm. Remove & Discard patch within 12 hours or as directed by MD   LISINOPRIL (PRINIVIL,ZESTRIL) 2.5 MG TABLET    Take 2.5 mg by mouth daily.    LORAZEPAM (ATIVAN) 0.5 MG TABLET    Take 0.5 tablets (0.25 mg total) by mouth at bedtime.   MECLIZINE (ANTIVERT) 25 MG TABLET    Take 1 tablet (25 mg total) by mouth 3 (three) times daily as needed for dizziness.   METHOCARBAMOL (ROBAXIN) 500 MG TABLET    Take 500 mg by mouth every 6 (six) hours as needed for muscle spasms.   NALOXEGOL OXALATE (MOVANTIK) 25 MG TABS TABLET    Take 25 mg by mouth daily.   NAPROXEN SODIUM (ANAPROX) 220 MG TABLET    Take 440 mg by mouth 2 (two) times daily with a meal.   NITROGLYCERIN (NITROSTAT) 0.4 MG SL TABLET    Place 1 tablet (0.4 mg total) under the tongue as needed. For chest pains   OMEPRAZOLE  (PRILOSEC) 40 MG  CAPSULE    Take 1 capsule (40 mg total) by mouth daily.   PROPYLENE GLYCOL (SYSTANE BALANCE) 0.6 % SOLN    Apply 1 drop to eye 2 (two) times daily. Both eyes   SACCHAROMYCES BOULARDII (FLORASTOR) 250 MG CAPSULE    Take 250 mg by mouth 2 (two) times daily.   SIMVASTATIN (ZOCOR) 80 MG TABLET    Take 1 tablet (80 mg total) by mouth daily.   TAMSULOSIN (FLOMAX) 0.4 MG CAPS CAPSULE    Take 1 capsule (0.4 mg total) by mouth daily after supper.   TORSEMIDE (DEMADEX) 20 MG TABLET    Take 1 tablet (20 mg total) by mouth 2 (two) times daily.   TRAZODONE (DESYREL) 100 MG TABLET    Take 100 mg by mouth at bedtime.    VITAMIN D, ERGOCALCIFEROL, (DRISDOL) 50000 UNITS CAPS CAPSULE    Take 1 capsule (50,000 Units total) by mouth every 7 (seven) days.   WOUND DRESSINGS (MEDIHONEY WOUND/BURN DRESSING EX)    Apply topically as needed.  Modified Medications   No medications on file  Discontinued Medications   HYDROCODONE-ACETAMINOPHEN (NORCO) 7.5-325 MG TABLET    Take one tablet by mouth at bedtime and take one tablet by mouth once daily prior to therapy and take one tablet by mouth three times daily as needed for pain. Not to exceed 3000mg  of APAP form all sources/24hr from all sources   SULFAMETHOXAZOLE-TRIMETHOPRIM (BACTRIM,SEPTRA) 400-80 MG TABLET    Take 1 tablet by mouth 2 (two) times daily. Reported on 07/30/2015   WARFARIN SODIUM (COUMADIN PO)    Take 5 mg by mouth every other day. Reported on 07/30/2015     SIGNIFICANT DIAGNOSTIC EXAMS  09-04-14: bilateral hip and pelvic x-ray: Left hip arthroplasty with mild acetabular protrusio. No fracture or dislocation is seen. No interval change.  09-04-14: right femur x-ray: No fracture or dislocation is seen.   09-04-14: left femur x-ray: No acute abnormality.  09-13-14: left hip and pelvic x-ray: No acute bony abnormality.   09-13-14: right shoulder x-ray: Advanced degenerative change in the shoulder joint. Negative for Fracture.  12-22-14: right  hand and right wrist x-ray: no acute process; right hand osteoarthritis present.   07-26-15: chest x-ray: bilateral lower lobe atelectasis.   07-30-15: chest x-ray: no acute cardiopulmonary pathology     LABS REVIEWED:   09-13-14: wbc 10.4; hgb 13.3; hct 41.7; mcv 81.8; plt 188; glucose 226; bun 23; creat 1.12; k+4.2; na++138  09-18-14: inr 2.55: coumadin 5 mg daily  11-06-14: inr 1.9  12-25-14: inr 3.03  02-23-15: tsh 4.032; vit d 11  03-08-15: wbc 7.7; hgb 11.5; hct 35.7; mcv 79.7; plt 156; glucose 115; bun 19; creat 0.96; k+ 4.6; na++146; liver normal albumin 3.4 INR 1.66  04-04-15: glucose 136; bun 19; creat 1.02; k+ 3.7; na++142 06-04-15: INR 2.77  06-18-15: INR 2.41  Urine for micro-albumin 0.7  06-25-15: wbc 8.0; hgb 12.1; hct 37.9 ;mcv 78.6; plt 159; glucose 122; bun 20; creat 0.93; k+ 4.3; na++142 liver normal albumin 3.6; chol 151; ldl 87; trig 162; hdl 32; hgb a1c 7.4 07-26-15: wbc 7.7; hgb 12.;3 hct 37.2; mcv 77.3; plt 106; glucose 161; bun 37; creat 1.75; k+ 4.3; na++136  07-30-15: wbc 12.8; hgb 10.7; hct 3.2; mcv 77.4; plt 174; g glucose 120; bun 43; creat 1.22; k+ 4.3; na++134 inr >10 (question reading of inr)      Review of Systems Constitutional: Negative for malaise/fatigue.  Respiratory: no cough or shortness of breath  Cardiovascular: Negative for chest pain, palpitations and leg swelling.  Gastrointestinal: Negative for heartburn, abdominal pain and constipation.  Musculoskeletal: bilateral leg pain.right knee left hip pain  and back spasms  Skin: Negative.   Neurological: Negative for headaches.  Psychiatric/Behavioral: . The patient is not nervous/anxious    Physical Exam Constitutional: He is oriented to person, place, and time. No distress.  Obese   Neck: Neck supple. No JVD present. No thyromegaly present.  Cardiovascular: Normal rate, regular rhythm and intact distal pulses.   Respiratory: Effort normal and breath sounds normal. No respiratory distress.  GI: Soft.  Bowel sounds are normal. He exhibits no distension. There is no tenderness.  Musculoskeletal:  Is able to move all extremities 1+ lower extremity edema   Neurological: He is alert and oriented to person, place, and time.  Skin: Skin is warm and dry. He is not diaphoretic.  Lower extremities discolored Coban dressing to bilateral lower legs Right knee and bilateral lower legs very tender to touch    Psychiatric: He has a normal mood and affect.       ASSESSMENT/ PLAN:  1. Osteoarthritis: has pain in numerous joints; will continue lidoderm patch as directed to shoulder;  Will continue cymbalta 90 mg daily will continue aleve 2 tabs twice daily   will continue  vicodin 7.5/325 mg every 6 hours routinely. Will begin him on prednisone 20 mg daily for 5 days   2. Back pain: chronic in nature; will continue  robaxin 500 mg every 6 hours   3. Respiratory infection: will continue augmentin and will continue to monitor his status.   4. PE/CVA: will stop his  Coumadin therapy at this time; when his INR is <3 will begin xarelto 20 mg nightly and will monitor              Ok Edwards NP [facility]  Contact [phone] Monday through Friday 8am- 5pm  After hours call [phone]

## 2015-08-01 LAB — PROTIME-INR: Protime: 35.5 seconds — AB (ref 10.0–13.8)

## 2015-08-01 LAB — POCT INR: INR: 3.5 — AB (ref 0.9–1.1)

## 2015-08-03 ENCOUNTER — Ambulatory Visit (HOSPITAL_COMMUNITY): Payer: Medicare Other

## 2015-08-03 ENCOUNTER — Encounter: Payer: Self-pay | Admitting: Vascular Surgery

## 2015-08-03 ENCOUNTER — Ambulatory Visit (INDEPENDENT_AMBULATORY_CARE_PROVIDER_SITE_OTHER): Payer: Medicare Other | Admitting: Vascular Surgery

## 2015-08-03 VITALS — BP 87/59 | HR 98 | Temp 97.8°F | Ht 67.0 in | Wt 270.0 lb

## 2015-08-03 DIAGNOSIS — I7025 Atherosclerosis of native arteries of other extremities with ulceration: Secondary | ICD-10-CM | POA: Diagnosis not present

## 2015-08-03 NOTE — Progress Notes (Signed)
Referred by:  Alveda Reasons, MD [address] [place], [place] [id number]  Reason for referral: bilateral leg wounds  History of Present Illness  [name] is a 75 y.o. (1940-04-05) male who presents with chief complaint: wounds in both legs.  Onset of symptom occurred ~4 weeks ago.  Pt associates his leg wounds with his wheelchair and trauma to his legs from the wheelchair.  Pt denies any pain except with manipulating his wounds.  He notes some serosanguinous drainage from his wound.  He is on anticoagulation and does not ambulate.  Both legs have chronically been swellen for years.  His memory is limited and he is unable to detail his prior operations.  Patient has attempted to treat this pain with rest.  The patient has no rest pain symptoms also.  This patient has had known PE.  Atherosclerotic risk factors include: DM, HTN and prior smoking.  After review of his old office charts: 1.  L pop artery aneurysm resection, L tb TE, L fem-pop bypass to tib BPG w/ cephalic vein with Dr. Justine Null (3/31/8) 2.  L CEA w/ Dr. Scot Dock (12/13/97) 3.  L EIA to pop vein bypass by Dr. Truman Hayward (06/15/81) 4.  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    Past Medical History  Diagnosis Date  . Depression   . Diabetes mellitus   . COPD (chronic obstructive pulmonary disease) ([place])   . Adhesive capsulitis   . MRSA bacteremia     2011 - possible endocarditis, received 6 weeks IV treatment  . Hyperlipidemia   . Anxiety   . GERD (gastroesophageal reflux disease)   . CAD (coronary artery disease)   . 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 CVA (cerebral infarction) Questionable history  . Diverticulosis   . Osteomyelitis ([place]) 2012    Sternoclavicular joint   . Chronic back pain   . Angina   . Myocardial infarction ([place])   . CHF (congestive heart failure) ([place])   .  Shortness of breath   . Sleep apnea   . Chronic kidney disease     hx of BPH  . Neuromuscular disorder ([place])     HX of diabetic periferal neuropathy  . Hypertension   . Peripheral vascular disease ([place])   . On home oxygen therapy     "1.5L prn" (04/12/2014)  . Falls frequently 06/2014    Past Surgical History  Procedure Laterality Date  . Septic arthritis      Removal of infected CABG wire by Dr Arlyce Dice - 2011  . Coronary artery bypass graft  1992  . Fracture surgery  1980s    Hip  . Spine surgery  2002    Cervical fusion vertebroplasty C3-4-5  . Coronary stent placement  1999, 2002  . Doppler echocardiography  Sept 2011    EF 50-55% with some impaired diastolic relaxation  . Colonoscopy  2006     Multiple polyps removed, repeat in 5 years  . Right iliopopliteal bypass - [place]  . Femoral-popliteal bypass graft  2008    Left  . Cardiac catheterization  October 18, 2010    Known obstructive disease, no further blockages  . Cardiac catheterization  03/17/2005    EF 35-40%  . US echocardiography  03/04/2006    EF 50-55%  . Amputation  03/16/2011    Procedure:  AMPUTATION DIGIT;  Surgeon: Angelia Mould, MD;  Location: [facility];  Service: Vascular;  Laterality: Left;  Great toe  . Hip arthroplasty Left     Social History   Social History  . Marital Status: Married    Spouse Name: Butch Penny  . Number of Children: 3  . Years of Education: 12   Occupational History  . Retired-machinest/welder    Social History Main Topics  . Smoking status: Former Smoker    Quit date: 06/19/2000  . Smokeless tobacco: Never Used  . Alcohol Use: No  . Drug Use: No  . Sexual Activity: No   Other Topics Concern  . Not on file   Social History Narrative   Health Care POA: TBD   Emergency Contact: wife, Butch Penny [phone]   End of Life Plan: pt does not have AD and not interested.   Who lives with you: Butch Penny and step-daughter-Betty   Any pets: none   Diet: Pt has a varied diet of  protein, starch and vegetables.   Exercise: Pt does not have regular exercise routine.   Seatbelts: Pt reports wearing seatbelt when in vehicles.    Hobbies: listening to car races.              Family History  Problem Relation Age of Onset  . Heart disease Father   . Heart disease Mother     Current Outpatient Prescriptions  Medication Sig Dispense Refill  . amoxicillin-clavulanate (AUGMENTIN) 875-125 MG tablet Take 1 tablet by mouth 2 (two) times daily.    . bisoprolol (ZEBETA) 5 MG tablet Take 0.5 tablets (2.5 mg total) by mouth daily. 30 tablet 0  . docusate sodium (COLACE) 100 MG capsule Take 100 mg by mouth 2 (two) times daily.    . DULoxetine (CYMBALTA) 30 MG capsule Take 90 mg by mouth daily.    Marland Kitchen gabapentin (NEURONTIN) 300 MG capsule Take 1 capsule (300 mg total) by mouth at bedtime. (Patient taking differently: Take 300 mg by mouth 3 (three) times daily. ) 90 capsule 3  . HYDROcodone-acetaminophen (NORCO) 7.5-325 MG tablet Take 1 tablet by mouth every 6 (six) hours as needed for moderate pain.    Marland Kitchen insulin aspart (NOVOLOG) 100 UNIT/ML injection Reported on 07/23/2015    . insulin lispro (HUMALOG KWIKPEN) 100 UNIT/ML KiwkPen Inject 8 Units into the skin 3 (three) times daily before meals.    . insulin NPH Human (HUMULIN N,NOVOLIN N) 100 UNIT/ML injection Inject 0.22 mLs (22 Units total) into the skin 2 (two) times daily before a meal. 10 mL 11  . ipratropium-albuterol (DUONEB) 0.5-2.5 (3) MG/3ML SOLN Take 3 mLs by nebulization every 6 (six) hours as needed.    . lidocaine (LIDODERM) 5 % Apply to shoulder topically two times daily for pain. Apply in the am and remove in pm. Remove & Discard patch within 12 hours or as directed by MD    . lisinopril (PRINIVIL,ZESTRIL) 2.5 MG tablet Take 2.5 mg by mouth daily.     Marland Kitchen LORazepam (ATIVAN) 0.5 MG tablet Take 0.5 tablets (0.25 mg total) by mouth at bedtime. 30 tablet 1  . meclizine (ANTIVERT) 25 MG tablet Take 1 tablet (25 mg total) by  mouth 3 (three) times daily as needed for dizziness. 45 tablet 1  . methocarbamol (ROBAXIN) 500 MG tablet Take 500 mg by mouth every 6 (six) hours as needed for muscle spasms.    . naloxegol oxalate (MOVANTIK) 25 MG TABS tablet Take 25 mg by mouth daily.    Marland Kitchen  naproxen sodium (ANAPROX) 220 MG tablet Take 440 mg by mouth 2 (two) times daily with a meal.    . nitroGLYCERIN (NITROSTAT) 0.4 MG SL tablet Place 1 tablet (0.4 mg total) under the tongue as needed. For chest pains (Patient taking differently: Place 0.4 mg under the tongue as needed for chest pain. ) 30 tablet 6  . omeprazole (PRILOSEC) 40 MG capsule Take 1 capsule (40 mg total) by mouth daily. 30 capsule 3  . Propylene Glycol (SYSTANE BALANCE) 0.6 % SOLN Apply 1 drop to eye 2 (two) times daily. Both eyes    . saccharomyces boulardii (FLORASTOR) 250 MG capsule Take 250 mg by mouth 2 (two) times daily.    . simvastatin (ZOCOR) 80 MG tablet Take 1 tablet (80 mg total) by mouth daily. 30 tablet 6  . tamsulosin (FLOMAX) 0.4 MG CAPS capsule Take 1 capsule (0.4 mg total) by mouth daily after supper. 90 capsule 1  . torsemide (DEMADEX) 20 MG tablet Take 1 tablet (20 mg total) by mouth 2 (two) times daily. 60 tablet 2  . traZODone (DESYREL) 100 MG tablet Take 100 mg by mouth at bedtime.     . Vitamin D, Ergocalciferol, (DRISDOL) 50000 units CAPS capsule Take 1 capsule (50,000 Units total) by mouth every 7 (seven) days. 30 capsule prn  . Wound Dressings (MEDIHONEY WOUND/BURN DRESSING EX) Apply topically as needed.     No current facility-administered medications for this visit.  Warfarin   Allergies  Allergen Reactions  . Vancomycin Other (See Comments)    "red man syndrome"  . Diazepam Anxiety    REACTION: makes patient cry     REVIEW OF SYSTEMS:  (Positives checked otherwise negative)  CARDIOVASCULAR:   [ ]  chest pain,  [ ]  chest pressure,  [ ]  palpitations,  [x]  shortness of breath when laying flat,  [x]  shortness of breath with  exertion,   [x]  pain in feet when walking,  [x]  pain in feet when laying flat, [x]  history of blood clot in veins (DVT),  [x]  history of phlebitis,  [x]  swelling in legs,  [x]  varicose veins  PULMONARY:   [ ]  productive cough,  [ ]  asthma,  [ ]  wheezing  NEUROLOGIC:   [ ]  weakness in arms or legs,  [ ]  numbness in arms or legs,  [ ]  difficulty speaking or slurred speech,  [ ]  temporary loss of vision in one eye,  [ ]  dizziness  HEMATOLOGIC:   [ ]  bleeding problems,  [ ]  problems with blood clotting too easily  MUSCULOSKEL:   [ ]  joint pain, [ ]  joint swelling  GASTROINTEST:   [ ]  vomiting blood,  [ ]  blood in stool     GENITOURINARY:   [ ]  burning with urination,  [ ]  blood in urine  PSYCHIATRIC:   [ ]  history of major depression  INTEGUMENTARY:   [ ]  rashes,  [x]  ulcers  CONSTITUTIONAL:   [ ]  fever,  [ ]  chills   For VQI Use Only  PRE-ADM LIVING: Home  AMB STATUS: Ambulatory  CAD Sx: History of MI, but no symptoms No MI within 6 months  PRIOR CHF: Asymptomatic, History of CHF  STRESS TEST: [x]  No, [ ]  Normal, [ ]  + ischemia, [ ]  + MI, [ ]  Both   Physical Examination  Filed Vitals:   08/03/15 1022  BP: 87/59  Pulse: 98  Temp: 97.8 F (36.6 C)  TempSrc: Oral  Height: 5\' 7"  (1.702 m)  Weight: 270  lb (122.471 kg)  SpO2: 96%   Body mass index is 42.28 kg/(m^2).  General: A&O x 3, morbidly obeses, slumped over  Head: [place]/AT  Ear/Nose/Throat: Hearing grossly intact, nares w/o erythema or drainage, oropharynx w/o Erythema/Exudate, Mallampati score: 3  Eyes: PERRLA, EOMI  Neck: Supple,  nuchal rigidity, no palpable LAD  Pulmonary: Sym exp, good air movt, CTAB, no rales, rhonchi, & wheezing  Cardiac: RRR, Nl S1, S2, no Murmurs, rubs or gallops  Vascular: Vessel Right Left  Radial Not Palpable Not Palpable  Ulnar Not Palpable Not Palpable  Brachial Not Palpable Not Palpable  Carotid Palpable, without bruit Palpable, without bruit    Aorta Not palpable due to obesity N/A  Femoral Not Palpable due to pannus overlying groin Not Palpable due to pannus overlying groin  Popliteal Not palpable Not palpable  PT Not Palpable Not Palpable  DP Not Palpable Not Palpable   Gastrointestinal: soft, NTND, -G/R, - HSM, - masses, - CVAT B  Musculoskeletal: M/S 5/5 BUE, limited PROM testing in both legs due to discomfort with manipulation , BLE edema 1-2+, small area of skin avulsion in R calf, small area of skin avulsion in medial R calf and 4-5 cm area in lateral calf, both feet cool  Neurologic: CN 2-12 intact , Pain and light touch intact in extremities except both feet which are anesthetic , Motor exam as listed above  Psychiatric: Judgment intact, Mood & affect appropriate for pt's clinical situation, limited memory  Dermatologic: See M/S exam for extremity exam, no rashes otherwise noted  Lymph : No Cervical, Axillary, or Inguinal lymphadenopathy    Medical Decision Making  DEVANTA JUBY is a 75 y.o. male who presents with: BLE CVI (C6), known LLE PAD likely moderate to severe   This patient appears to have multiple active high grade medical problems.  I doubt he would be a candidate for any further surgical interventions.  I would focus on wound care in this patient primarily.  Would start with dry dressing to both calf wounds and apply ACE bandages.  I have ordered BLE ABI and BLE venous reflux duplexes to determine severity of his arterial and venous disease.  His arterial disease will likely be under diagnosed with the ABI given his known DM.  I discussed in depth with the patient the nature of atherosclerosis, and emphasized the importance of maximal medical management including strict control of blood pressure, blood glucose, and lipid levels, antiplatelet agents, obtaining regular exercise, and cessation of smoking.  The patient is aware that without maximal medical management the underlying atherosclerotic  disease process will progress, limiting the benefit of any interventions. The patient is currently on a statin:  Zocor. The patient is currently not on an anti-platelet: as on Warfarin.  Thank you for allowing [place] to participate in this patient's care.   Adele Barthel, MD [facility] Office: [phone] Pager: [phone]  08/03/2015, 11:17 AM

## 2015-08-06 ENCOUNTER — Other Ambulatory Visit: Payer: Self-pay | Admitting: *Deleted

## 2015-08-06 DIAGNOSIS — R600 Localized edema: Secondary | ICD-10-CM

## 2015-08-06 NOTE — Progress Notes (Signed)
Location:  [place] of Service:  SNF (31)    Allergies  Allergen Reactions  . Vancomycin Other (See Comments)    "red man syndrome"  . Diazepam Anxiety    REACTION: makes patient cry    Chief Complaint  Patient presents with  . Acute Visit    patient status     HPI:  He is more short of breath; has required use of 02. There are no reports of fever present. He states that he is coughing but not coughing up anything. He states that he is just feeling bad.   Past Medical History  Diagnosis Date  . Depression   . Diabetes mellitus   . COPD (chronic obstructive pulmonary disease) ([place])   . Adhesive capsulitis   . MRSA bacteremia     2011 - possible endocarditis, received 6 weeks IV treatment  . Hyperlipidemia   . Anxiety   . GERD (gastroesophageal reflux disease)   . CAD (coronary artery disease)   . 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 CVA (cerebral infarction) Questionable history  . Diverticulosis   . Osteomyelitis ([place]) 2012    Sternoclavicular joint   . Chronic back pain   . Angina   . Myocardial infarction ([place])   . CHF (congestive heart failure) ([place])   . Shortness of breath   . Sleep apnea   . Chronic kidney disease     hx of BPH  . Neuromuscular disorder ([place])     HX of diabetic periferal neuropathy  . Hypertension   . Peripheral vascular disease ([place])   . On home oxygen therapy     "1.5L prn" (04/12/2014)  . Falls frequently 06/2014    Past Surgical History  Procedure Laterality Date  . Septic arthritis      Removal of infected CABG wire by Dr Arlyce Dice - 2011  . Coronary artery bypass graft  1992  . Fracture surgery  1980s    Hip  . Spine surgery  2002    Cervical fusion vertebroplasty C3-4-5  . Coronary stent placement  1999, 2002  . Doppler echocardiography  Sept 2011    EF 50-55% with some impaired diastolic relaxation  . Colonoscopy  2006     Multiple polyps removed, repeat in  5 years  . Right iliopopliteal bypass - [place]  . Femoral-popliteal bypass graft  2008    Left  . Cardiac catheterization  October 18, 2010    Known obstructive disease, no further blockages  . Cardiac catheterization  03/17/2005    EF 35-40%  . US echocardiography  03/04/2006    EF 50-55%  . Amputation  03/16/2011    Procedure: AMPUTATION DIGIT;  Surgeon: Angelia Mould, MD;  Location: [facility] OR;  Service: Vascular;  Laterality: Left;  Great toe  . Hip arthroplasty Left     Social History   Social History  . Marital Status: Married    Spouse Name: Butch Penny  . Number of Children: 3  . Years of Education: 12   Occupational History  . Retired-machinest/welder    Social History Main Topics  . Smoking status: Former Smoker    Quit date: 06/19/2000  . Smokeless tobacco: Never Used  . Alcohol Use: No  . Drug Use: No  . Sexual Activity: No   Other Topics Concern  . Not on file   Social History Narrative   Health Care POA: TBD  Emergency Contact: wife, Butch Penny [phone]   End of Life Plan: pt does not have AD and not interested.   Who lives with you: Butch Penny and step-daughter-Betty   Any pets: none   Diet: Pt has a varied diet of protein, starch and vegetables.   Exercise: Pt does not have regular exercise routine.   Seatbelts: Pt reports wearing seatbelt when in vehicles.    Hobbies: listening to car races.             Family History  Problem Relation Age of Onset  . Heart disease Father   . Heart disease Mother       VITAL SIGNS BP 132/80 mmHg  Pulse 90  Ht 5\' 7"  (1.702 m)  Wt 282 lb (127.914 kg)  BMI 44.16 kg/m2  Patient's Medications  New Prescriptions   No medications on file  Previous Medications   AMOXICILLIN-CLAVULANATE (AUGMENTIN) 875-125 MG TABLET    Take 1 tablet by mouth 2 (two) times daily.   BISOPROLOL (ZEBETA) 5 MG TABLET    Take 0.5 tablets (2.5 mg total) by mouth daily.   DOCUSATE SODIUM (COLACE) 100 MG CAPSULE    Take 100 mg by mouth 2  (two) times daily.   DULOXETINE (CYMBALTA) 30 MG CAPSULE    Take 90 mg by mouth daily.   GABAPENTIN (NEURONTIN) 300 MG CAPSULE    Take 1 capsule (300 mg total) by mouth at bedtime.   HYDROCODONE-ACETAMINOPHEN (NORCO) 7.5-325 MG TABLET    Take 1 tablet by mouth every 6 (six) hours as needed for moderate pain.   INSULIN ASPART (NOVOLOG) 100 UNIT/ML INJECTION    Reported on 07/23/2015   INSULIN LISPRO (HUMALOG KWIKPEN) 100 UNIT/ML KIWKPEN    Inject 8 Units into the skin 3 (three) times daily before meals.   INSULIN NPH HUMAN (HUMULIN N,NOVOLIN N) 100 UNIT/ML INJECTION    Inject 0.22 mLs (22 Units total) into the skin 2 (two) times daily before a meal.   IPRATROPIUM-ALBUTEROL (DUONEB) 0.5-2.5 (3) MG/3ML SOLN    Take 3 mLs by nebulization every 6 (six) hours as needed.   LIDOCAINE (LIDODERM) 5 %    Apply to shoulder topically two times daily for pain. Apply in the am and remove in pm. Remove & Discard patch within 12 hours or as directed by MD   LISINOPRIL (PRINIVIL,ZESTRIL) 2.5 MG TABLET    Take 2.5 mg by mouth daily.    LORAZEPAM (ATIVAN) 0.5 MG TABLET    Take 0.5 tablets (0.25 mg total) by mouth at bedtime.   MECLIZINE (ANTIVERT) 25 MG TABLET    Take 1 tablet (25 mg total) by mouth 3 (three) times daily as needed for dizziness.   METHOCARBAMOL (ROBAXIN) 500 MG TABLET    Take 500 mg by mouth every 6 (six) hours as needed for muscle spasms.   NALOXEGOL OXALATE (MOVANTIK) 25 MG TABS TABLET    Take 25 mg by mouth daily.   NAPROXEN SODIUM (ANAPROX) 220 MG TABLET    Take 440 mg by mouth 2 (two) times daily with a meal.   NITROGLYCERIN (NITROSTAT) 0.4 MG SL TABLET    Place 1 tablet (0.4 mg total) under the tongue as needed. For chest pains   OMEPRAZOLE (PRILOSEC) 40 MG CAPSULE    Take 1 capsule (40 mg total) by mouth daily.   PROPYLENE GLYCOL (SYSTANE BALANCE) 0.6 % SOLN    Apply 1 drop to eye 2 (two) times daily. Both eyes   SACCHAROMYCES BOULARDII (FLORASTOR) 250 MG CAPSULE  Take 250 mg by mouth 2 (two)  times daily.   SIMVASTATIN (ZOCOR) 80 MG TABLET    Take 1 tablet (80 mg total) by mouth daily.   TAMSULOSIN (FLOMAX) 0.4 MG CAPS CAPSULE    Take 1 capsule (0.4 mg total) by mouth daily after supper.   TORSEMIDE (DEMADEX) 20 MG TABLET    Take 40 mg total) by mouth 2 (two) times daily.   TRAZODONE (DESYREL) 100 MG TABLET    Take 100 mg by mouth at bedtime.    VITAMIN D, ERGOCALCIFEROL, (DRISDOL) 50000 UNITS CAPS CAPSULE    Take 1 capsule (50,000 Units total) by mouth every 7 (seven) days.   WOUND DRESSINGS (MEDIHONEY WOUND/BURN DRESSING EX)    Apply topically as needed.  Modified Medications   No medications on file  Discontinued Medications   No medications on file     SIGNIFICANT DIAGNOSTIC EXAMS   09-04-14: bilateral hip and pelvic x-ray: Left hip arthroplasty with mild acetabular protrusio. No fracture or dislocation is seen. No interval change.  09-04-14: right femur x-ray: No fracture or dislocation is seen.   09-04-14: left femur x-ray: No acute abnormality.  09-13-14: left hip and pelvic x-ray: No acute bony abnormality.   09-13-14: right shoulder x-ray: Advanced degenerative change in the shoulder joint. Negative for Fracture.  12-22-14: right hand and right wrist x-ray: no acute process; right hand osteoarthritis present.   07-26-15: chest x-ray: mild reticular opacities in bilateral lower lobes bilateral lower lobe atelectasis.     LABS REVIEWED:   09-13-14: wbc 10.4; hgb 13.3; hct 41.7; mcv 81.8; plt 188; glucose 226; bun 23; creat 1.12; k+4.2; na++138  09-18-14: inr 2.55: coumadin 5 mg daily  11-06-14: inr 1.9  12-25-14: inr 3.03  02-23-15: tsh 4.032; vit d 11  03-08-15: wbc 7.7; hgb 11.5; hct 35.7; mcv 79.7; plt 156; glucose 115; bun 19; creat 0.96; k+ 4.6; na++146; liver normal albumin 3.4 INR 1.66  04-04-15: glucose 136; bun 19; creat 1.02; k+ 3.7; na++142 06-04-15: INR 2.77  06-18-15: INR 2.41  Urine for micro-albumin 0.7  06-25-15: wbc 8.0; hgb 12.1; hct 37.9 ;mcv 78.6; plt  159; glucose 122; bun 20; creat 0.93; k+ 4.3; na++142 liver normal albumin 3.6; chol 151; ldl 87; trig 162; hdl 32; hgb a1c 7.4 07-26-15: wbc 7.7; hgb 12.3; hct 37.2; mcv 77.;3 plt 106; glucose 161; bun 37; creat 1.57; k+ 4.3; na++136      Review of Systems Constitutional: Negative for malaise/fatigue.  Respiratory: no cough or shortness of breath  Cardiovascular: Negative for chest pain, palpitations and leg swelling.  Gastrointestinal: Negative for heartburn, abdominal pain and constipation.  Musculoskeletal: bilateral leg pain.right knee left hip pain  and back spasms  Skin: Negative.   Neurological: Negative for headaches.  Psychiatric/Behavioral: . The patient is not nervous/anxious    Physical Exam Constitutional: He is oriented to person, place, and time. No distress.  Obese   Neck: Neck supple. No JVD present. No thyromegaly present.  Cardiovascular: Normal rate, regular rhythm and intact distal pulses.   Respiratory: Effort normal and breath sounds normal. No respiratory distress.  GI: Soft. Bowel sounds are normal. He exhibits no distension. There is no tenderness.  Musculoskeletal:  Is able to move all extremities 1+ lower extremity edema   Neurological: He is alert and oriented to person, place, and time.  Skin: Skin is warm and dry. He is not diaphoretic.  Lower extremities discolored Coban dressing to bilateral lower legs Right knee and bilateral lower  legs very tender to touch    Psychiatric: He has a normal mood and affect.       ASSESSMENT/ PLAN:  1. Pneumonia: will begin augmentin 875 mg twice daily for 2 weeks with florastor. Will stop his septra at this time. Will begin duoneb every 6 hours for one week then every 6 hours as needed. Will check inr 3 times weekly while on abt  2. Acute renal failure will hold demadex for 24 hours then 20 mg daily   Will check uric acid level; will repeat chest x-ray cbc; bmp on 07-30-15.    Ok Edwards NP [facility]  Adult Medicine  Contact [phone] Monday through Friday 8am- 5pm  After hours call [phone]

## 2015-08-07 ENCOUNTER — Ambulatory Visit (HOSPITAL_COMMUNITY)
Admission: RE | Admit: 2015-08-07 | Discharge: 2015-08-07 | Disposition: A | Discharge status: Home / Self Care | Payer: Medicare Other | Source: Ambulatory Visit | Attending: Vascular Surgery | Admitting: Vascular Surgery

## 2015-08-07 ENCOUNTER — Ambulatory Visit (INDEPENDENT_AMBULATORY_CARE_PROVIDER_SITE_OTHER)
Admission: RE | Admit: 2015-08-07 | Discharge: 2015-08-07 | Disposition: A | Discharge status: Home / Self Care | Payer: Medicare Other | Source: Ambulatory Visit | Attending: Vascular Surgery | Admitting: Vascular Surgery

## 2015-08-07 DIAGNOSIS — L97909 Non-pressure chronic ulcer of unspecified part of unspecified lower leg with unspecified severity: Secondary | ICD-10-CM | POA: Insufficient documentation

## 2015-08-07 DIAGNOSIS — I7025 Atherosclerosis of native arteries of other extremities with ulceration: Secondary | ICD-10-CM

## 2015-08-07 DIAGNOSIS — R600 Localized edema: Secondary | ICD-10-CM

## 2015-08-10 ENCOUNTER — Ambulatory Visit (INDEPENDENT_AMBULATORY_CARE_PROVIDER_SITE_OTHER): Payer: Medicare Other | Admitting: Family

## 2015-08-10 ENCOUNTER — Encounter: Payer: Self-pay | Admitting: Family

## 2015-08-10 VITALS — BP 121/68 | HR 93 | Temp 97.6°F | Resp 18

## 2015-08-10 DIAGNOSIS — E1151 Type 2 diabetes mellitus with diabetic peripheral angiopathy without gangrene: Secondary | ICD-10-CM | POA: Diagnosis not present

## 2015-08-10 DIAGNOSIS — I6522 Occlusion and stenosis of left carotid artery: Secondary | ICD-10-CM

## 2015-08-10 DIAGNOSIS — I87339 Chronic venous hypertension (idiopathic) with ulcer and inflammation of unspecified lower extremity: Secondary | ICD-10-CM

## 2015-08-10 DIAGNOSIS — I779 Disorder of arteries and arterioles, unspecified: Secondary | ICD-10-CM | POA: Diagnosis not present

## 2015-08-10 DIAGNOSIS — L97909 Non-pressure chronic ulcer of unspecified part of unspecified lower leg with unspecified severity: Principal | ICD-10-CM

## 2015-08-10 NOTE — Progress Notes (Signed)
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 (09-22-1940) male patient of Dr. Bridgett Larsson who presents with chief complaint: wounds in both legs. Onset of symptom occurred about April 2017;  associates his leg wounds with his wheelchair and trauma to his legs from the wheelchair. Pt denies any pain except with manipulating his wounds. He notes some serosanguinous drainage from his left lateral calf wound. He is on anticoagulation and does not ambulate. Both legs have chronically been swellen for years. His memory is limited and he is unable to detail his prior operations. Patient has attempted to treat this pain with rest. The patient has no rest pain symptoms also. This patient has had known PE, he is on Xarelto. Atherosclerotic risk factors include: DM, CAD with hx of CABG x 2 separate surgeries, CHF, HTN, morbid obesity, chronic pain with use of analgesics, sedentary lifestyle with limited ability to walk, and prior smoking.  After review of his old office charts: 1. L pop artery aneurysm resection, L tb TE, L fem-pop bypass to tib BPG w/ cephalic vein with Dr. Justine Null (3/31/8) 2. L CEA w/ Dr. Scot Dock (12/13/97) 3. L EIA to pop vein bypass by Dr. Truman Hayward (06/15/81) 4. 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   Dr. Bridgett Larsson last saw pt on 08/03/15. At that time pt presented with: BLE CVI (C6), known LLE PAD likely moderate to severe This patient appears to have multiple active high grade medical problems.  I doubt he would be a candidate for any further surgical interventions.  I would focus on wound care in this patient primarily. Would start with dry dressing to both calf wounds and apply ACE bandages.  I have ordered BLE ABI and BLE venous reflux duplexes to determine severity of his arterial and  venous disease. His arterial disease will likely be under diagnosed with the ABI given his known DM.   Pt is a resident of [place]. He states that both of his lower legs dressings are changed every 2-3 days. The dressings appear to contain zinc oxide. Pt states he was treated for wound care at [facility] in the past with good results.  The patient's PMH, PSH, SH, and FamHx are unchanged from 08/03/15.  Current Outpatient Prescriptions  Medication Sig Dispense Refill  . bisoprolol (ZEBETA) 5 MG tablet Take 0.5 tablets (2.5 mg total) by mouth daily. 30 tablet 0  . docusate sodium (COLACE) 100 MG capsule Take 100 mg by mouth 2 (two) times daily.    . DULoxetine (CYMBALTA) 30 MG capsule Take 90 mg by mouth daily.    Marland Kitchen gabapentin (NEURONTIN) 300 MG capsule Take 1 capsule (300 mg total) by mouth at bedtime. (Patient taking differently: Take 300 mg by mouth 3 (three) times daily. ) 90 capsule 3  . HYDROcodone-acetaminophen (NORCO) 7.5-325 MG tablet Take 1 tablet by mouth every 6 (six) hours as needed for moderate pain.    Marland Kitchen insulin aspart (NOVOLOG) 100 UNIT/ML injection Reported on 07/23/2015    . insulin lispro (HUMALOG KWIKPEN) 100 UNIT/ML KiwkPen Inject 8 Units into the skin 3 (three) times daily before meals.    . insulin NPH Human (HUMULIN N,NOVOLIN N) 100 UNIT/ML injection Inject 0.22 mLs (22 Units total) into the skin 2 (two) times daily before a meal. 10 mL 11  .  ipratropium-albuterol (DUONEB) 0.5-2.5 (3) MG/3ML SOLN Take 3 mLs by nebulization every 6 (six) hours as needed.    . lidocaine (LIDODERM) 5 % Apply to shoulder topically two times daily for pain. Apply in the am and remove in pm. Remove & Discard patch within 12 hours or as directed by MD    . lisinopril (PRINIVIL,ZESTRIL) 2.5 MG tablet Take 2.5 mg by mouth daily.     Marland Kitchen LORazepam (ATIVAN) 0.5 MG tablet Take 0.5 tablets (0.25 mg total) by mouth at bedtime. 30 tablet 1  . meclizine (ANTIVERT) 25 MG tablet Take 1 tablet (25 mg total) by  mouth 3 (three) times daily as needed for dizziness. 45 tablet 1  . methocarbamol (ROBAXIN) 500 MG tablet Take 500 mg by mouth every 6 (six) hours as needed for muscle spasms.    . naloxegol oxalate (MOVANTIK) 25 MG TABS tablet Take 25 mg by mouth daily.    . naproxen sodium (ANAPROX) 220 MG tablet Take 440 mg by mouth 2 (two) times daily with a meal.    . nitroGLYCERIN (NITROSTAT) 0.4 MG SL tablet Place 1 tablet (0.4 mg total) under the tongue as needed. For chest pains (Patient taking differently: Place 0.4 mg under the tongue as needed for chest pain. ) 30 tablet 6  . omeprazole (PRILOSEC) 40 MG capsule Take 1 capsule (40 mg total) by mouth daily. 30 capsule 3  . Propylene Glycol (SYSTANE BALANCE) 0.6 % SOLN Apply 1 drop to eye 2 (two) times daily. Both eyes    . saccharomyces boulardii (FLORASTOR) 250 MG capsule Take 250 mg by mouth 2 (two) times daily.    . simvastatin (ZOCOR) 80 MG tablet Take 1 tablet (80 mg total) by mouth daily. 30 tablet 6  . tamsulosin (FLOMAX) 0.4 MG CAPS capsule Take 1 capsule (0.4 mg total) by mouth daily after supper. 90 capsule 1  . torsemide (DEMADEX) 20 MG tablet Take 1 tablet (20 mg total) by mouth 2 (two) times daily. 60 tablet 2  . traZODone (DESYREL) 100 MG tablet Take 100 mg by mouth at bedtime.     . Vitamin D, Ergocalciferol, (DRISDOL) 50000 units CAPS capsule Take 1 capsule (50,000 Units total) by mouth every 7 (seven) days. 30 capsule prn  . Wound Dressings (MEDIHONEY WOUND/BURN DRESSING EX) Apply topically as needed.     No current facility-administered medications for this visit.    Allergies  Allergen Reactions  . Vancomycin Other (See Comments)    "red man syndrome"  . Diazepam Anxiety    REACTION: makes patient cry    On ROS today: See HPI for pertinent positives and negatives   Physical Examination  Filed Vitals:   08/10/15 0850  BP: 121/68  Pulse: 93  Temp: 97.6 F (36.4 C)  TempSrc: Oral  Resp: 18  SpO2: 96%   There is no  weight on file to calculate BMI.  General: A&O x 3, morbidly obese male, seated in wheelchair  Head: [place]/AT  Eyes: PERRLA  Neck: Supple,no palpable LAD, soft mass at right posterior neck, appearance of lipoma  Pulmonary: Sym exp, limited air movt,  no rales or rhonchi, + wheezing  Cardiac: RRR, Nl S1, S2, no detected murmurs, rubs or gallops  Vascular: Vessel Right Left  Radial Not Palpable Not Palpable  Ulnar Not Palpable Not Palpable  Brachial Not Palpable Not Palpable  Carotid Palpable, with bruit Palpable, with bruit  Aorta Not palpable due to obesity N/A  Femoral Not Palpable due to pannus overlying  groin Not Palpable due to pannus overlying groin  Popliteal Not palpable Not palpable  PT Not Palpable Not Palpable  DP Not Palpable Not Palpable   Gastrointestinal: soft, NTND, -G/R, - HSM, - palpable masses, large panus, - CVAT B  Musculoskeletal: M/S 5/5 BUE, limited PROM testing in both legs due to discomfort with manipulation , BLE edema 1-2+,  small area of skin avulsion in medial L calf and 4-5 cm area in lateral calf, several small shallow non draining venous stasis ulcers. Both feet cool  Neurologic: CN 2-12 intact , Pain and light touch intact in extremities except both feet which are anesthetic , Motor exam as listed above  Psychiatric: Judgment intact, Mood & affect appropriate for pt's clinical situation, limited memory  Dermatologic: See M/S exam for extremity exam, no rashes otherwise noted           Non-Invasive Vascular Imaging(08/07/15):  ABI  R: 1.29 (1.04, 10/13/11), DP: biphasic, PT: biphasic, TBI: not performed  L: 1.29 (0.65), DP: monophasic, PT: monophasic, TBI: ray amputation    LOWER EXTREMITY VENOUS DUPLEX REFLUX EVALUATION    INDICATION: Edema     INTERVENTION(S): Patient states previous vein harvesting for heart and vascular surgeries    DUPLEX EXAM: The reflux portion of the duplex exam was performed  with the patient in reverse Trendelenburg and the examination bed at a 30-45 degree angle.        RIGHT VEIN LEFT  Reflux > 500 ms Thrombosis  Reflux > 500 ms Thrombosis  NA  Common Femoral Vein NA   N N Femoral Vein N N  N N Popliteal Vein N N  NA N Saphenofemoral Junction NA NA  Not Visualized  Great Saphenous Vein Not Visualized   N N Small Saphenous Vein N N    Thrombosis:  N = None, A = Acute, C = Chronic, O = Obstructive, P = Partially Obstructive                                        Reflux:  N = None, Y = Yes       See attached diagram(s) for additional diagnostic information.    NOTE:  Incompetence is defined as continuous reflux > 500 milliseconds with distal augmentation maneuvers or valsalva     ADDITIONAL FINDINGS: . Unable to adequately evaluate the bilateral common femoral and proximal femoral veins due to the patient's exam having to be performed upright in a wheelchair. . The bilateral great saphenous veins were not visualized which is consistent with the patient's stated history of vein harvesting.    IMPRESSION: 1. No evidence of deep vein thrombosis noted in the bilateral mid to distal femoral or popliteal veins. 2. No evidence of superficial vein thrombosis noted in the bilateral lower extremities. 3. No venous incompetence is noted in the visualized vein of the bilateral lower extremities.    Outside Studies/Documentation 5 pages of outside documents were reviewed including: med list, problem list.   Medical Decision Making  [name] is a 75 y.o. male who presents with: BLE critical limb ischemia with wounds, BLE CVI (C6). 08/07/15 bilateral LE venous duplex: Unable to adequately evaluate the bilateral common femoral and proximal femoral veins due to the patient's exam having to be performed upright in a wheelchair. The bilateral great saphenous veins were not visualized which is consistent with the patient's stated history of  vein harvesting.   No  evidence of deep vein thrombosis noted in the bilateral mid to distal femoral or popliteal veins.   No evidence of superficial vein thrombosis noted in the bilateral lower extremities.   No venous incompetence is noted in the visualized vein of the bilateral lower extremities  Dr. Bridgett Larsson spoke with and evaluated pt. Una boots bilateral lower legs x 1 week. Referral to wound care center at [facility] to treat bilateral lower legs venous stasis ulcers, left worse than right Return in 2 months for evaluation of venous stasis ulcers, repeat ABI's, and carotid duplex, see me on a day that Dr. Bridgett Larsson is in the office.    I discussed in depth with the patient the nature of atherosclerosis, and emphasized the importance of maximal medical management including strict control of blood pressure, blood glucose, and lipid levels, antiplatelet agents, obtaining regular exercise, and cessation of smoking.    The patient is aware that without maximal medical management the underlying atherosclerotic disease process will progress, limiting the benefit of any interventions. The patient is currently on a statin. The patient is currently on Xarelto  Thank you for allowing [place] to participate in this patient's care.   Adele Barthel, MD [facility] Office: [phone] Pager: [phone]  08/10/2015, 9:06 AM

## 2015-08-10 NOTE — Patient Instructions (Signed)
Venous Ulcer A venous ulcer is a shallow sore on your lower leg that is caused by poor circulation in your veins. This condition used to be called stasis ulcer.  Veins have valves that help return blood to the heart. If these valves do not work properly, it can cause blood to flow backward and to back up into the veins near the skin. When that happens, blood can pool in your lower legs. The blood can then leak out of your veins, which can irritate your skin. This may cause a break in your skin that becomes a venous ulcer. Venous ulcer is the most common type of lower leg ulcer. You may have venous ulcers on one leg or on both legs. The area where this condition most commonly develops is around the ankles. A venous ulcer may last for a long time (chronic ulcer) or it may return repeatedly (recurrent ulcer). CAUSES Any condition that causes poor circulation to your legs can lead to a venous ulcer.  RISK FACTORS This condition is more likely to develop in:  People who are 65 years of age or older.  People who are overweight.  People who are not active.  People who have had a leg ulcer in the past.  People who have clots in their lower leg veins (deep vein thrombosis).  People who have inflammation of their leg veins (phlebitis).  Women who have given birth.  People who smoke. SYMPTOMS  The most common symptom of this condition is an open sore near your ankle. Other symptoms may include:  Swelling.  Thickening of the skin.  Fluid leaking from the ulcer.  Bleeding.  Itching.  Pain and swelling that gets worse when you stand up and feels better when you raise your leg.  Blotchy skin.  Darkening of the skin. DIAGNOSIS  Your health care provider may suspect a venous ulcer based on your medical history and your risk factors. Your health care provider will check the skin on your legs. Other tests may be done to learn more about the ulcer and to determine the best way to treat it.  Tests that may be done include:  Measuring the blood pressure in your arms and legs.  Using sound waves (ultrasound) to measure the blood flow in your leg veins. TREATMENT You may need to try several different types of treatment to get your venous ulcer to heal. Healing may take a long time. Treatment may include:  Keeping your leg raised (elevated).  Wearing a type of bandage or stocking to compress the veins of your leg (compression therapy). Venous wounds are not likely to heal or to stay healed without compression.  Taking medicines to improve blood flow.  Taking antibiotic medicines to treat infection.  Cleaning your ulcer and removing any dead tissue from the wound (debridement).  Placing various types of medicated bandage (dressings) or medicated wraps on your ulcer. This helps the ulcer to heal and helps to prevent infection. Surgery is sometimes needed to close the wound using a piece of skin taken from another area of your body (graft). You may need surgery if other treatments are not working or if your ulcer is very deep. HOME CARE INSTRUCTIONS Wound Care  Follow instructions from your health care provider about:  How to take care of your wound.  When and how you should change your bandage (dressing).  When you should remove your dressing. If your dressing is dry and sticks to your leg when you try to remove   it, moisten or wet the dressing with saline solution or water so that the dressing can be removed without harming your skin or wound tissue.  Check your wound every day for signs of infection. Have a caregiver do this for you if you are not able to do it yourself. Check for:  More redness, swelling, or pain. More fluid or blood.  Pus, warmth, or a bad smell. Medicines   Take over-the-counter and prescription medicines only as told by your health care provider.  If you were prescribed an antibiotic medicine, take it or apply it as told by your health care  provider. Do not stop taking or using the antibiotic even if your condition improves. Activity  Do not stand or sit in one position for a long period of time. Rest with your legs raised during the day. If possible, keep your legs above your heart for 30 minutes, 3-4 times a day, or as told by your health care provider.  Do not sit with your legs crossed.   Walk often to increase the blood flow in your legs.Ask your health care provider what level of activity is safe for you.  If you are taking a long ride in a car or plane, take a break to walk around at least once every two hours, or as often as your health care provider recommends. Ask your health care provider if you should take aspirin before long trips.  General Instructions  Wear elastic stockings, compression stockings, or support hose as told by your health care provider. This is very important.  Raise the foot of your bed as told by your health care provider.  Do not smoke.  Keep all follow-up visits as told by your health care provider. This is important. SEEK MEDICAL CARE IF:   You have a fever.   Your ulcer is getting larger or is not healing.   Your pain gets worse.   You have more redness or swelling around your ulcer.  You have more fluid, blood, or pus coming from your ulcer after it has been cleaned by you or your health care provider.  You have warmth or a bad smell coming from your ulcer.   This information is not intended to replace advice given to you by your health care provider. Make sure you discuss any questions you have with your health care provider.   Document Released: 12/03/2000 Document Revised: 11/29/2014 Document Reviewed: 07/19/2014 Elsevier Interactive Patient Education 2016 Elsevier Inc.    Peripheral Vascular Disease Peripheral vascular disease (PVD) is a disease of the blood vessels that are not part of your heart and brain. A simple term for PVD is poor circulation. In most cases,  PVD narrows the blood vessels that carry blood from your heart to the rest of your body. This can result in a decreased supply of blood to your arms, legs, and internal organs, like your stomach or kidneys. However, it most often affects a person's lower legs and feet. There are two types of PVD.  Organic PVD. This is the more common type. It is caused by damage to the structure of blood vessels.  Functional PVD. This is caused by conditions that make blood vessels contract and tighten (spasm). Without treatment, PVD tends to get worse over time. PVD can also lead to acute ischemic limb. This is when an arm or limb suddenly has trouble getting enough blood. This is a medical emergency. CAUSES Each type of PVD has many different causes. The most  common cause of PVD is buildup of a fatty material (plaque) inside of your arteries (atherosclerosis). Small amounts of plaque can break off from the walls of the blood vessels and become lodged in a smaller artery. This blocks blood flow and can cause acute ischemic limb. Other common causes of PVD include:  Blood clots that form inside of blood vessels.  Injuries to blood vessels.  Diseases that cause inflammation of blood vessels or cause blood vessel spasms.  Health behaviors and health history that increase your risk of developing PVD. RISK FACTORS  You may have a greater risk of PVD if you:  Have a family history of PVD.  Have certain medical conditions, including:  High cholesterol.  Diabetes.  High blood pressure (hypertension).  Coronary heart disease.  Past problems with blood clots.  Past injury, such as burns or a broken bone. These may have damaged blood vessels in your limbs.  Buerger disease. This is caused by inflamed blood vessels in your hands and feet.  Some forms of arthritis.  Rare birth defects that affect the arteries in your legs.  Use tobacco.  Do not get enough exercise.  Are obese.  Are age 42 or  older. SIGNS AND SYMPTOMS  PVD may cause many different symptoms. Your symptoms depend on what part of your body is not getting enough blood. Some common signs and symptoms include:  Cramps in your lower legs. This may be a symptom of poor leg circulation (claudication).  Pain and weakness in your legs while you are physically active that goes away when you rest (intermittent claudication).  Leg pain when at rest.  Leg numbness, tingling, or weakness.  Coldness in a leg or foot, especially when compared with the other leg.  Skin or hair changes. These can include:  Hair loss.  Shiny skin.  Pale or bluish skin.  Thick toenails.  Inability to get or maintain an erection (erectile dysfunction). People with PVD are more prone to developing ulcers and sores on their toes, feet, or legs. These may take longer than normal to heal. DIAGNOSIS Your health care provider may diagnose PVD from your signs and symptoms. The health care provider will also do a physical exam. You may have tests to find out what is causing your PVD and determine its severity. Tests may include:  Blood pressure recordings from your arms and legs and measurements of the strength of your pulses (pulse volume recordings).  Imaging studies using sound waves to take pictures of the blood flow through your blood vessels (Doppler ultrasound).  Injecting a dye into your blood vessels before having imaging studies using:  X-rays (angiogram or arteriogram).  Computer-generated X-rays (CT angiogram).  A powerful electromagnetic field and a computer (magnetic resonance angiogram or MRA). TREATMENT Treatment for PVD depends on the cause of your condition and the severity of your symptoms. It also depends on your age. Underlying causes need to be treated and controlled. These include long-lasting (chronic) conditions, such as diabetes, high cholesterol, and high blood pressure. You may need to first try making lifestyle  changes and taking medicines. Surgery may be needed if these do not work. Lifestyle changes may include:  Quitting smoking.  Exercising regularly.  Following a low-fat, low-cholesterol diet. Medicines may include:  Blood thinners to prevent blood clots.  Medicines to improve blood flow.  Medicines to improve your blood cholesterol levels. Surgical procedures may include:  A procedure that uses an inflated balloon to open a blocked artery and improve  blood flow (angioplasty).  A procedure to put in a tube (stent) to keep a blocked artery open (stent implant).  Surgery to reroute blood flow around a blocked artery (peripheral bypass surgery).  Surgery to remove dead tissue from an infected wound on the affected limb.  Amputation. This is surgical removal of the affected limb. This may be necessary in cases of acute ischemic limb that are not improved through medical or surgical treatments. HOME CARE INSTRUCTIONS  Take medicines only as directed by your health care provider.  Do not use any tobacco products, including cigarettes, chewing tobacco, or electronic cigarettes. If you need help quitting, ask your health care provider.  Lose weight if you are overweight, and maintain a healthy weight as directed by your health care provider.  Eat a diet that is low in fat and cholesterol. If you need help, ask your health care provider.  Exercise regularly. Ask your health care provider to suggest some good activities for you.  Use compression stockings or other mechanical devices as directed by your health care provider.  Take good care of your feet.  Wear comfortable shoes that fit well.  Check your feet often for any cuts or sores. SEEK MEDICAL CARE IF:  You have cramps in your legs while walking.  You have leg pain when you are at rest.  You have coldness in a leg or foot.  Your skin changes.  You have erectile dysfunction.  You have cuts or sores on your feet that  are not healing. SEEK IMMEDIATE MEDICAL CARE IF:  Your arm or leg turns cold and blue.  Your arms or legs become red, warm, swollen, painful, or numb.  You have chest pain or trouble breathing.  You suddenly have weakness in your face, arm, or leg.  You become very confused or lose the ability to speak.  You suddenly have a very bad headache or lose your vision.   This information is not intended to replace advice given to you by your health care provider. Make sure you discuss any questions you have with your health care provider.   Document Released: 04/17/2004 Document Revised: 03/31/2014 Document Reviewed: 08/18/2013 Elsevier Interactive Patient Education [employer].

## 2015-08-13 LAB — POCT INR: INR: 1.7 — AB (ref 0.9–1.1)

## 2015-08-13 LAB — PROTIME-INR: PROTIME: 19.9 s — AB (ref 10.0–13.8)

## 2015-08-16 NOTE — Addendum Note (Signed)
Addended by: Mena Goes on: 08/16/2015 01:15 PM   Modules accepted: Orders

## 2015-08-17 NOTE — Addendum Note (Signed)
Addended by: Reola Calkins on: 08/17/2015 12:38 PM   Modules accepted: Orders

## 2015-08-21 ENCOUNTER — Non-Acute Institutional Stay (SKILLED_NURSING_FACILITY): Payer: Medicare Other | Admitting: Internal Medicine

## 2015-08-21 ENCOUNTER — Encounter: Payer: Self-pay | Admitting: Internal Medicine

## 2015-08-21 DIAGNOSIS — R1310 Dysphagia, unspecified: Secondary | ICD-10-CM | POA: Diagnosis not present

## 2015-08-21 DIAGNOSIS — I8311 Varicose veins of right lower extremity with inflammation: Secondary | ICD-10-CM

## 2015-08-21 DIAGNOSIS — E1149 Type 2 diabetes mellitus with other diabetic neurological complication: Secondary | ICD-10-CM | POA: Diagnosis not present

## 2015-08-21 DIAGNOSIS — I5022 Chronic systolic (congestive) heart failure: Secondary | ICD-10-CM

## 2015-08-21 DIAGNOSIS — I10 Essential (primary) hypertension: Secondary | ICD-10-CM | POA: Diagnosis not present

## 2015-08-21 DIAGNOSIS — M159 Polyosteoarthritis, unspecified: Secondary | ICD-10-CM

## 2015-08-21 DIAGNOSIS — I2782 Chronic pulmonary embolism: Secondary | ICD-10-CM | POA: Diagnosis not present

## 2015-08-21 DIAGNOSIS — I8312 Varicose veins of left lower extremity with inflammation: Secondary | ICD-10-CM

## 2015-08-21 DIAGNOSIS — M5442 Lumbago with sciatica, left side: Secondary | ICD-10-CM | POA: Diagnosis not present

## 2015-08-21 DIAGNOSIS — M5441 Lumbago with sciatica, right side: Secondary | ICD-10-CM | POA: Diagnosis not present

## 2015-08-21 DIAGNOSIS — I872 Venous insufficiency (chronic) (peripheral): Secondary | ICD-10-CM

## 2015-08-21 DIAGNOSIS — M150 Primary generalized (osteo)arthritis: Secondary | ICD-10-CM

## 2015-08-21 DIAGNOSIS — F332 Major depressive disorder, recurrent severe without psychotic features: Secondary | ICD-10-CM | POA: Diagnosis not present

## 2015-08-21 DIAGNOSIS — F411 Generalized anxiety disorder: Secondary | ICD-10-CM | POA: Diagnosis not present

## 2015-08-21 NOTE — Progress Notes (Signed)
DATE: 08/21/15  Location:  [facility] Room Number: 159 B Place of Service: SNF (31)   Extended Emergency Contact Information Primary Emergency Contact: Lianne Moris Address: [place], [place] Mobile Phone: [phone] Relation: Spouse  Advanced Directive information Does patient have an advance directive?: Yes, Type of Advance Directive: Out of facility DNR (pink MOST or yellow form), Does patient want to make changes to advanced directive?: No - Patient declined  Chief Complaint  Patient presents with  . Medical Management of Chronic Issues    Routine Visit    HPI:  75 yo male long term resident seen today for f/u. He c/o generalized pain 8/10 on scale, constant. He states his "nerve pill" gets stuck in his throat each time he takes it. He is taking 1/2 tab of lorazepam. He has no issues with swallowing his other medications. He was tx with Augmentin for HCAP earlier this month. He sees vascula sx for venous ulcer on LE with chronic stasis dermatitis. He is a poor historian due to psych d/o. Hx obtained from chart.  Chronic systolic heart failure - stable on demadex  20 mg twice daily; takes ACEI    Hypertension - BP stable on zebeta 2.5 mg daily; lisinopril 2.5 mg daily  DM - CBG 128 today. A1c 7.4. He takes novolog 8 units prior to meals with an additional 5 units  for cbg >=150; NPH  22 units twice daily with meals. He has neuropathy and takes neurontin 300 mg TID and cymbalta 90 mg daily. He is on ACEI  CAD - he is s/p CABG and stents.  no complaints of chest pain present; takes zebeta 2.5 mg daily; lisinopril 2.5mg  daily; has not req'd prn ntg  GERD - stable on prilosec 40 mg daily   Hx CVA - stable. Takes xeralto  Hx PE - coumadin changed to xeralto earlier this month due to difficulty with INR stability. No bleeding   Venous stasis dermatitis/LE ulcer/PAD - followed by  vascular sx. Facility wound care following venous ulcer  Hyperlipidemia - stable on zocor 80 mg daily. LDL 87  BPH - sx's stable on flomax 0.4 mg daily   Osteoarthritis/chronic pain -  Uses lidoderm patch as directed to shoulder; norco 7.5/325 mg prior to therapy and at night with tid prn; aleve 2 tabs twice daily for his right hand pain. He was tx with 5 days of prednisone earlier this month. He is taking robaxin for muscle spasms  MDD/Anxiety/insomnia - mood stable on trazodone 50 mg nightly; takes ativan 0.25 mg nightly; melatonin 3 mg nightly for sleep; cymbalta 90 mg daily  Vit D deficiency - stable on vit d 50,000 units weekly   Constipation - due to long term narcotic use. Takes movantic 25 mg daily   Past Medical History  Diagnosis Date  . Depression   . Diabetes mellitus   . COPD (chronic obstructive pulmonary disease) ([place])   . Adhesive capsulitis   . MRSA bacteremia     2011 - possible endocarditis, received 6 weeks IV treatment  . Hyperlipidemia   . Anxiety   . GERD (gastroesophageal reflux disease)   . CAD (coronary artery disease)   . 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 CVA (cerebral infarction) Questionable history  . Diverticulosis   . Osteomyelitis ([place]) 2012    Sternoclavicular  joint   . Chronic back pain   . Angina   . Myocardial infarction ([place])   . CHF (congestive heart failure) ([place])   . Shortness of breath   . Sleep apnea   . Chronic kidney disease     hx of BPH  . Neuromuscular disorder ([place])     HX of diabetic periferal neuropathy  . Hypertension   . Peripheral vascular disease ([place])   . On home oxygen therapy     "1.5L prn" (04/12/2014)  . Falls frequently 06/2014    Past Surgical History  Procedure Laterality Date  . Septic arthritis      Removal of infected CABG wire by Dr Arlyce Dice - 2011  . Coronary artery bypass graft  1992  . Fracture surgery  1980s    Hip  . Spine surgery  2002    Cervical fusion  vertebroplasty C3-4-5  . Coronary stent placement  1999, 2002  . Doppler echocardiography  Sept 2011    EF 50-55% with some impaired diastolic relaxation  . Colonoscopy  2006     Multiple polyps removed, repeat in 5 years  . Right iliopopliteal bypass - [place]  . Femoral-popliteal bypass graft  2008    Left  . Cardiac catheterization  October 18, 2010    Known obstructive disease, no further blockages  . Cardiac catheterization  03/17/2005    EF 35-40%  . US echocardiography  03/04/2006    EF 50-55%  . Amputation  03/16/2011    Procedure: AMPUTATION DIGIT;  Surgeon: Angelia Mould, MD;  Location: [facility] OR;  Service: Vascular;  Laterality: Left;  Great toe  . Hip arthroplasty Left     Patient Care Team: Alveda Reasons, MD as PCP - General Darlin Coco, MD (Cardiology) Clent Jacks, MD (Ophthalmology)  Social History   Social History  . Marital Status: Married    Spouse Name: Butch Penny  . Number of Children: 3  . Years of Education: 12   Occupational History  . Retired-machinest/welder    Social History Main Topics  . Smoking status: Former Smoker    Quit date: 06/19/2000  . Smokeless tobacco: Never Used  . Alcohol Use: No  . Drug Use: No  . Sexual Activity: No   Other Topics Concern  . Not on file   Social History Narrative   Health Care POA: TBD   Emergency Contact: wife, Butch Penny [phone]   End of Life Plan: pt does not have AD and not interested.   Who lives with you: Butch Penny and step-daughter-Betty   Any pets: none   Diet: Pt has a varied diet of protein, starch and vegetables.   Exercise: Pt does not have regular exercise routine.   Seatbelts: Pt reports wearing seatbelt when in vehicles.    Hobbies: listening to car races.               reports that he quit smoking about 15 years ago. He has never used smokeless tobacco. He reports that he does not drink alcohol or use illicit drugs.  Family History  Problem Relation Age of Onset  . Heart  disease Father   . Heart disease Mother     Immunization History  Administered Date(s) Administered  . Influenza Split 12/12/2010  . Influenza Whole 12/22/2005, 12/14/2007  . Influenza,inj,Quad PF,36+ Mos 01/20/2013, 12/09/2013  . Influenza-Unspecified 12/15/2014  . Pneumococcal Conjugate-13 10/10/2013  . Pneumococcal Polysaccharide-23 11/23/1999, 03/25/2003, 05/25/2011  . Td 10/23/2003  . Tdap 07/17/2014  Allergies  Allergen Reactions  . Vancomycin Other (See Comments)    "red man syndrome"  . Diazepam Anxiety    REACTION: makes patient cry    Medications: Patient's Medications  New Prescriptions   No medications on file  Previous Medications   BISOPROLOL (ZEBETA) 5 MG TABLET    Take 0.5 tablets (2.5 mg total) by mouth daily.   DOCUSATE SODIUM (COLACE) 100 MG CAPSULE    Take 100 mg by mouth 2 (two) times daily.   DULOXETINE (CYMBALTA) 30 MG CAPSULE    Take 90 mg by mouth daily.   GABAPENTIN (NEURONTIN) 300 MG CAPSULE    Take 300 mg by mouth 3 (three) times daily.   HYDROCODONE-ACETAMINOPHEN (NORCO) 7.5-325 MG TABLET    Take 1 tablet by mouth every 6 (six) hours as needed for moderate pain.   INSULIN LISPRO (HUMALOG KWIKPEN) 100 UNIT/ML KIWKPEN    Inject 8 Units into the skin 3 (three) times daily before meals.   INSULIN NPH HUMAN (HUMULIN N,NOVOLIN N) 100 UNIT/ML INJECTION    Inject 0.22 mLs (22 Units total) into the skin 2 (two) times daily before a meal.   IPRATROPIUM-ALBUTEROL (DUONEB) 0.5-2.5 (3) MG/3ML SOLN    Take 3 mLs by nebulization every 6 (six) hours as needed.   LIDOCAINE (LIDODERM) 5 %    Apply to shoulder topically two times daily for pain. Apply in the am and remove in pm. Remove & Discard patch within 12 hours or as directed by MD   LISINOPRIL (PRINIVIL,ZESTRIL) 2.5 MG TABLET    Take 2.5 mg by mouth daily.    LORAZEPAM (ATIVAN) 0.5 MG TABLET    Take 0.5 tablets (0.25 mg total) by mouth at bedtime.   MECLIZINE (ANTIVERT) 25 MG TABLET    Take 1 tablet (25 mg  total) by mouth 3 (three) times daily as needed for dizziness.   METHOCARBAMOL (ROBAXIN) 500 MG TABLET    Take 500 mg by mouth every 6 (six) hours as needed for muscle spasms.   NALOXEGOL OXALATE (MOVANTIK) 25 MG TABS TABLET    Take 25 mg by mouth daily.   NAPROXEN SODIUM (ANAPROX) 220 MG TABLET    Take 440 mg by mouth 2 (two) times daily with a meal.   NITROGLYCERIN (NITROSTAT) 0.4 MG SL TABLET    Place 1 tablet (0.4 mg total) under the tongue as needed. For chest pains   OMEPRAZOLE (PRILOSEC) 40 MG CAPSULE    Take 1 capsule (40 mg total) by mouth daily.   PROPYLENE GLYCOL (SYSTANE BALANCE) 0.6 % SOLN    Apply 1 drop to eye 2 (two) times daily. Both eyes   RIVAROXABAN (XARELTO) 20 MG TABS TABLET    Take 20 mg by mouth daily with supper.   SIMVASTATIN (ZOCOR) 80 MG TABLET    Take 1 tablet (80 mg total) by mouth daily.   TAMSULOSIN (FLOMAX) 0.4 MG CAPS CAPSULE    Take 1 capsule (0.4 mg total) by mouth daily after supper.   TORSEMIDE (DEMADEX) 20 MG TABLET    Take 1 tablet (20 mg total) by mouth 2 (two) times daily.   TRAZODONE (DESYREL) 100 MG TABLET    Take 100 mg by mouth at bedtime.    VITAMIN D, ERGOCALCIFEROL, (DRISDOL) 50000 UNITS CAPS CAPSULE    Take 1 capsule (50,000 Units total) by mouth every 7 (seven) days.  Modified Medications   No medications on file  Discontinued Medications   GABAPENTIN (NEURONTIN) 300 MG CAPSULE  Take 1 capsule (300 mg total) by mouth at bedtime.   INSULIN ASPART (NOVOLOG) 100 UNIT/ML INJECTION    Reported on 08/21/2015   WOUND DRESSINGS (MEDIHONEY WOUND/BURN DRESSING EX)    Apply topically as needed. Reported on 08/21/2015    Review of Systems  Unable to perform ROS: Psychiatric disorder    Filed Vitals:   08/21/15 1007  BP: 99/52  Pulse: 91  Temp: 97.7 F (36.5 C)  TempSrc: Oral  Resp: 18  Height: 5\' 7"  (1.702 m)  Weight: 266 lb 8 oz (120.884 kg)  SpO2: 94%   Body mass index is 41.73 kg/(m^2).  Physical Exam  Constitutional: He appears  well-developed and well-nourished.  Sitting in w/c in NAD  HENT:  Mouth/Throat: Oropharynx is clear and moist.  Eyes: Pupils are equal, round, and reactive to light. No scleral icterus.  Neck: Neck supple. Carotid bruit is not present. No thyromegaly present.  Cardiovascular: Normal rate, regular rhythm and intact distal pulses.  Exam reveals no gallop and no friction rub.   Murmur (1/6 SEM) heard. +1 pitting BLE edema with chronic venous stasis changes. No calf TTP  Pulmonary/Chest: Effort normal and breath sounds normal. He has no wheezes. He has no rales. He exhibits no tenderness.  Abdominal: Soft. Bowel sounds are normal. He exhibits no distension, no abdominal bruit, no pulsatile midline mass and no mass. There is no tenderness. There is no rebound and no guarding.  Musculoskeletal: He exhibits edema and tenderness.  Right clavicular TTP with reduced ROM at right shoulder  Lymphadenopathy:    He has no cervical adenopathy.  Neurological: He is alert.  Skin: Skin is warm and dry. No rash noted.  B/l leg/foot dsg c/d/i  Psychiatric: He has a normal mood and affect. His behavior is normal.     Labs reviewed: Nursing Home on 07/23/2015  Component Date Value Ref Range Status  . INR 07/09/2015 1.8* 0.9 - 1.1 Final  . Protime 07/09/2015 21.0* 10.0 - 13.8 seconds Final  Nursing Home on 06/18/2015  Component Date Value Ref Range Status  . INR 06/11/2015 3.1* 0.9 - 1.1 Final  . Protime 06/11/2015 32.3* 10.0 - 13.8 seconds Final  . Glucose 06/18/2015 130   Final  Nursing Home on 06/06/2015  Component Date Value Ref Range Status  . INR 04/25/2015 2.0* 0.9 - 1.1 Final  . Protime 04/25/2015 23.3* 10.0 - 13.8 seconds Final    No results found.   Assessment/Plan   ICD-9-CM ICD-10-CM   1. Primary osteoarthritis involving multiple joints 715.09 M15.0   2. Bilateral low back pain with sciatica, sciatica laterality unspecified 724.3 M54.41     M54.42   3. Chronic systolic heart  failure (HCC) 428.22 I50.22   4. Other chronic pulmonary embolism without acute cor pulmonale (HCC)  I27.82   5. Essential hypertension 401.9 I10   6. Type II diabetes mellitus with neurological manifestations ([place]) 250.60 E11.49   7. Severe episode of recurrent major depressive disorder, without psychotic features ([place]) 296.33 F33.2   8. Venous stasis dermatitis of both lower extremities 454.1 I83.11     I83.12   9. GAD (generalized anxiety disorder) 300.02 F41.1   10. Dysphagia 787.20 R13.10     Change ativan 0.5mg  to whole tablet daily prn  Cont other meds as ordered  PT/OT/ST as ordered  F/u with specialists as ordered  Wound care as ordered  Will follow  [facility] [place], [place] [address] [phone] Cell (Monday-Friday 8 AM - 5 PM) [phone] After 5 PM and follow prompts

## 2015-08-28 ENCOUNTER — Encounter (HOSPITAL_BASED_OUTPATIENT_CLINIC_OR_DEPARTMENT_OTHER): Payer: Medicare Other | Attending: Surgery

## 2015-08-28 DIAGNOSIS — J449 Chronic obstructive pulmonary disease, unspecified: Secondary | ICD-10-CM | POA: Diagnosis not present

## 2015-08-28 DIAGNOSIS — I87313 Chronic venous hypertension (idiopathic) with ulcer of bilateral lower extremity: Secondary | ICD-10-CM | POA: Insufficient documentation

## 2015-08-28 DIAGNOSIS — L89610 Pressure ulcer of right heel, unstageable: Secondary | ICD-10-CM | POA: Insufficient documentation

## 2015-08-28 DIAGNOSIS — Z951 Presence of aortocoronary bypass graft: Secondary | ICD-10-CM | POA: Diagnosis not present

## 2015-08-28 DIAGNOSIS — I70244 Atherosclerosis of native arteries of left leg with ulceration of heel and midfoot: Secondary | ICD-10-CM | POA: Insufficient documentation

## 2015-08-28 DIAGNOSIS — L97511 Non-pressure chronic ulcer of other part of right foot limited to breakdown of skin: Secondary | ICD-10-CM | POA: Insufficient documentation

## 2015-08-28 DIAGNOSIS — L97821 Non-pressure chronic ulcer of other part of left lower leg limited to breakdown of skin: Secondary | ICD-10-CM | POA: Insufficient documentation

## 2015-08-28 DIAGNOSIS — Z89432 Acquired absence of left foot: Secondary | ICD-10-CM | POA: Insufficient documentation

## 2015-08-28 DIAGNOSIS — I70234 Atherosclerosis of native arteries of right leg with ulceration of heel and midfoot: Secondary | ICD-10-CM | POA: Diagnosis not present

## 2015-08-28 DIAGNOSIS — I509 Heart failure, unspecified: Secondary | ICD-10-CM | POA: Diagnosis not present

## 2015-08-28 DIAGNOSIS — Z86711 Personal history of pulmonary embolism: Secondary | ICD-10-CM | POA: Diagnosis not present

## 2015-08-28 DIAGNOSIS — Z794 Long term (current) use of insulin: Secondary | ICD-10-CM | POA: Diagnosis not present

## 2015-08-28 DIAGNOSIS — I110 Hypertensive heart disease with heart failure: Secondary | ICD-10-CM | POA: Diagnosis not present

## 2015-08-28 DIAGNOSIS — Z79891 Long term (current) use of opiate analgesic: Secondary | ICD-10-CM | POA: Diagnosis not present

## 2015-08-28 DIAGNOSIS — Z7901 Long term (current) use of anticoagulants: Secondary | ICD-10-CM | POA: Diagnosis not present

## 2015-08-28 DIAGNOSIS — Z87891 Personal history of nicotine dependence: Secondary | ICD-10-CM | POA: Insufficient documentation

## 2015-08-28 DIAGNOSIS — E10621 Type 1 diabetes mellitus with foot ulcer: Secondary | ICD-10-CM | POA: Diagnosis not present

## 2015-08-28 DIAGNOSIS — I2510 Atherosclerotic heart disease of native coronary artery without angina pectoris: Secondary | ICD-10-CM | POA: Diagnosis not present

## 2015-08-28 DIAGNOSIS — Z993 Dependence on wheelchair: Secondary | ICD-10-CM | POA: Diagnosis not present

## 2015-08-28 DIAGNOSIS — L97521 Non-pressure chronic ulcer of other part of left foot limited to breakdown of skin: Secondary | ICD-10-CM | POA: Insufficient documentation

## 2015-08-28 DIAGNOSIS — L89622 Pressure ulcer of left heel, stage 2: Secondary | ICD-10-CM | POA: Diagnosis not present

## 2015-08-28 DIAGNOSIS — Z79899 Other long term (current) drug therapy: Secondary | ICD-10-CM | POA: Insufficient documentation

## 2015-08-28 DIAGNOSIS — Z6841 Body Mass Index (BMI) 40.0 and over, adult: Secondary | ICD-10-CM | POA: Insufficient documentation

## 2015-09-04 DIAGNOSIS — E10621 Type 1 diabetes mellitus with foot ulcer: Secondary | ICD-10-CM | POA: Diagnosis not present

## 2015-09-11 DIAGNOSIS — E10621 Type 1 diabetes mellitus with foot ulcer: Secondary | ICD-10-CM | POA: Diagnosis not present

## 2015-09-13 ENCOUNTER — Non-Acute Institutional Stay (SKILLED_NURSING_FACILITY): Payer: Medicare Other | Admitting: Adult Health

## 2015-09-13 ENCOUNTER — Encounter: Payer: Self-pay | Admitting: Adult Health

## 2015-09-13 DIAGNOSIS — I739 Peripheral vascular disease, unspecified: Secondary | ICD-10-CM

## 2015-09-13 DIAGNOSIS — E1142 Type 2 diabetes mellitus with diabetic polyneuropathy: Secondary | ICD-10-CM

## 2015-09-13 DIAGNOSIS — I7025 Atherosclerosis of native arteries of other extremities with ulceration: Secondary | ICD-10-CM | POA: Diagnosis not present

## 2015-09-13 NOTE — Progress Notes (Signed)
Patient ID: [name], male   DOB: 03-29-1940, 75 y.o.   MRN: [id number]   Location:  [facility] Room Number: 159-B Place of Service:  SNF (31)   CODE STATUS: DNR  Allergies  Allergen Reactions  . Vancomycin Other (See Comments)    "red man syndrome"  . Diazepam Anxiety    REACTION: makes patient cry    Chief Complaint  Patient presents with  . Acute Visit    Wound check    HPI:  I have been asked to review his leg wounds. He does have venous stasis ulcers. He does have pain present in his bilateral lower extremities. He has increased pain with dressing changes. There are no signs of infection present.   Past Medical History  Diagnosis Date  . Depression   . Diabetes mellitus   . COPD (chronic obstructive pulmonary disease) ([place])   . Adhesive capsulitis   . MRSA bacteremia     2011 - possible endocarditis, received 6 weeks IV treatment  . Hyperlipidemia   . Anxiety   . GERD (gastroesophageal reflux disease)   . CAD (coronary artery disease)   . 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 CVA (cerebral infarction) Questionable history  . Diverticulosis   . Osteomyelitis ([place]) 2012    Sternoclavicular joint   . Chronic back pain   . Angina   . Myocardial infarction ([place])   . CHF (congestive heart failure) ([place])   . Shortness of breath   . Sleep apnea   . Chronic kidney disease     hx of BPH  . Neuromuscular disorder ([place])     HX of diabetic periferal neuropathy  . Hypertension   . Peripheral vascular disease ([place])   . On home oxygen therapy     "1.5L prn" (04/12/2014)  . Falls frequently 06/2014    Past Surgical History  Procedure Laterality Date  . Septic arthritis      Removal of infected CABG wire by Dr Arlyce Dice - 2011  . Coronary artery bypass graft  1992  . Fracture surgery  1980s    Hip  . Spine surgery  2002    Cervical fusion vertebroplasty C3-4-5  . Coronary stent placement  1999, 2002   . Doppler echocardiography  Sept 2011    EF 50-55% with some impaired diastolic relaxation  . Colonoscopy  2006     Multiple polyps removed, repeat in 5 years  . Right iliopopliteal bypass - [place]  . Femoral-popliteal bypass graft  2008    Left  . Cardiac catheterization  October 18, 2010    Known obstructive disease, no further blockages  . Cardiac catheterization  03/17/2005    EF 35-40%  . US echocardiography  03/04/2006    EF 50-55%  . Amputation  03/16/2011    Procedure: AMPUTATION DIGIT;  Surgeon: Angelia Mould, MD;  Location: [facility] OR;  Service: Vascular;  Laterality: Left;  Great toe  . Hip arthroplasty Left     Social History   Social History  . Marital Status: Married    Spouse Name: Butch Penny  . Number of Children: 3  . Years of Education: 12   Occupational History  . Retired-machinest/welder    Social History Main Topics  . Smoking status: Former Smoker    Quit date: 06/19/2000  . Smokeless tobacco: Never Used  . Alcohol Use: No  . Drug Use: No  . Sexual Activity:  No   Other Topics Concern  . Not on file   Social History Narrative   Health Care POA: TBD   Emergency Contact: wife, Butch Penny [phone]   End of Life Plan: pt does not have AD and not interested.   Who lives with you: Butch Penny and step-daughter-Betty   Any pets: none   Diet: Pt has a varied diet of protein, starch and vegetables.   Exercise: Pt does not have regular exercise routine.   Seatbelts: Pt reports wearing seatbelt when in vehicles.    Hobbies: listening to car races.             Family History  Problem Relation Age of Onset  . Heart disease Father   . Heart disease Mother       VITAL SIGNS BP 119/56 mmHg  Pulse 95  Temp(Src) 97.5 F (36.4 C) (Oral)  Resp 20  Ht 5\' 7"  (1.702 m)  Wt 274 lb 2 oz (124.342 kg)  BMI 42.92 kg/m2  SpO2 93%  Patient's Medications  New Prescriptions   No medications on file  Previous Medications   BISOPROLOL (ZEBETA) 5 MG TABLET    Take  0.5 tablets (2.5 mg total) by mouth daily.   DOCUSATE SODIUM (COLACE) 100 MG CAPSULE    Take 100 mg by mouth 2 (two) times daily.   DULOXETINE (CYMBALTA) 30 MG CAPSULE    Take 90 mg by mouth daily.   GABAPENTIN (NEURONTIN) 300 MG CAPSULE    Take 300 mg by mouth 3 (three) times daily.   HYDROCODONE-ACETAMINOPHEN (NORCO) 7.5-325 MG TABLET    Take 1 tablet by mouth every 6 (six) hours as needed for moderate pain.   INSULIN LISPRO (HUMALOG KWIKPEN) 100 UNIT/ML KIWKPEN    Inject 8 Units into the skin 3 (three) times daily before meals. An additional if CBG is 0-149=0 units, 150-400= 5 units before meals and at bedtime   INSULIN NPH HUMAN (HUMULIN N,NOVOLIN N) 100 UNIT/ML INJECTION    Inject 0.22 mLs (22 Units total) into the skin 2 (two) times daily before a meal.   IPRATROPIUM-ALBUTEROL (DUONEB) 0.5-2.5 (3) MG/3ML SOLN    Take 3 mLs by nebulization every 6 (six) hours as needed.   LIDOCAINE (LIDODERM) 5 %    Apply to shoulder topically two times daily for pain. Apply in the am and remove in pm. Remove & Discard patch within 12 hours or as directed by MD   LISINOPRIL (PRINIVIL,ZESTRIL) 2.5 MG TABLET    Take 2.5 mg by mouth daily.    LORAZEPAM (ATIVAN) 0.5 MG TABLET    Take 0.5 tablets (0.25 mg total) by mouth at bedtime.   MECLIZINE (ANTIVERT) 25 MG TABLET    Take 1 tablet (25 mg total) by mouth 3 (three) times daily as needed for dizziness.   METHOCARBAMOL (ROBAXIN) 500 MG TABLET    Take 500 mg by mouth every 6 (six) hours as needed for muscle spasms.   NALOXEGOL OXALATE (MOVANTIK) 25 MG TABS TABLET    Take 25 mg by mouth daily.   NAPROXEN SODIUM (ANAPROX) 220 MG TABLET    Take 440 mg by mouth 2 (two) times daily with a meal.   NITROGLYCERIN (NITROSTAT) 0.4 MG SL TABLET    Place 1 tablet (0.4 mg total) under the tongue as needed. For chest pains   OMEPRAZOLE (PRILOSEC) 40 MG CAPSULE    Take 1 capsule (40 mg total) by mouth daily.   PROPYLENE GLYCOL (SYSTANE BALANCE) 0.6 % SOLN  Apply 1 drop to eye 2  (two) times daily. Both eyes   RIVAROXABAN (XARELTO) 20 MG TABS TABLET    Take 20 mg by mouth daily with supper.   SIMVASTATIN (ZOCOR) 80 MG TABLET    Take 1 tablet (80 mg total) by mouth daily.   TAMSULOSIN (FLOMAX) 0.4 MG CAPS CAPSULE    Take 1 capsule (0.4 mg total) by mouth daily after supper.   TORSEMIDE (DEMADEX) 20 MG TABLET    Take 1 tablet (20 mg total) by mouth 2 (two) times daily.   TRAZODONE (DESYREL) 100 MG TABLET    Take 100 mg by mouth at bedtime.    VITAMIN D, ERGOCALCIFEROL, (DRISDOL) 50000 UNITS CAPS CAPSULE    Take 1 capsule (50,000 Units total) by mouth every 7 (seven) days.  Modified Medications   No medications on file  Discontinued Medications   No medications on file     SIGNIFICANT DIAGNOSTIC EXAMS  09-04-14: bilateral hip and pelvic x-ray: Left hip arthroplasty with mild acetabular protrusio. No fracture or dislocation is seen. No interval change.  09-04-14: right femur x-ray: No fracture or dislocation is seen.   09-04-14: left femur x-ray: No acute abnormality.  09-13-14: left hip and pelvic x-ray: No acute bony abnormality.   09-13-14: right shoulder x-ray: Advanced degenerative change in the shoulder joint. Negative for Fracture.  12-22-14: right hand and right wrist x-ray: no acute process; right hand osteoarthritis present.   07-26-15: chest x-ray: bilateral lower lobe atelectasis.   07-30-15: chest x-ray: no acute cardiopulmonary pathology     LABS REVIEWED:   11-06-14: inr 1.9  12-25-14: inr 3.03  02-23-15: tsh 4.032; vit d 11  03-08-15: wbc 7.7; hgb 11.5; hct 35.7; mcv 79.7; plt 156; glucose 115; bun 19; creat 0.96; k+ 4.6; na++146; liver normal albumin 3.4 INR 1.66  04-04-15: glucose 136; bun 19; creat 1.02; k+ 3.7; na++142 06-04-15: INR 2.77  06-18-15: INR 2.41  Urine for micro-albumin 0.7  06-25-15: wbc 8.0; hgb 12.1; hct 37.9 ;mcv 78.6; plt 159; glucose 122; bun 20; creat 0.93; k+ 4.3; na++142 liver normal albumin 3.6; chol 151; ldl 87; trig 162; hdl  32; hgb a1c 7.4 07-26-15: wbc 7.7; hgb 12.;3 hct 37.2; mcv 77.3; plt 106; glucose 161; bun 37; creat 1.75; k+ 4.3; na++136  07-30-15: wbc 12.8; hgb 10.7; hct 3.2; mcv 77.4; plt 174; g glucose 120; bun 43; creat 1.22; k+ 4.3; na++134 inr >10 (question reading of inr)      Review of Systems Constitutional: Negative for malaise/fatigue.  Respiratory: no cough or shortness of breath  Cardiovascular: Negative for chest pain, palpitations and leg swelling.  Gastrointestinal: Negative for heartburn, abdominal pain and constipation.  Musculoskeletal: bilateral leg pain.right knee left hip pain  and back spasms  Skin: Negative.   Neurological: Negative for headaches.  Psychiatric/Behavioral: . The patient is not nervous/anxious    Physical Exam Constitutional: He is oriented to person, place, and time. No distress.  Obese   Neck: Neck supple. No JVD present. No thyromegaly present.  Cardiovascular: Normal rate, regular rhythm and intact distal pulses.   Respiratory: Effort normal and breath sounds normal. No respiratory distress.  GI: Soft. Bowel sounds are normal. He exhibits no distension. There is no tenderness.  Musculoskeletal:  Is able to move all extremities 1+ lower extremity edema   Neurological: He is alert and oriented to person, place, and time.  Skin: Skin is warm and dry. He is not diaphoretic.  Lower extremities discolored Right knee  and bilateral lower legs very tender to touch Right heel unstaged: 1.6 x 2.4 cm calcium alginate Left lateral shin 0.6 x 0.4 x 0.05 cm calcium alginate Left lateral lower leg: 3 x 2.8 x 0.1 cm Left dorsal foot: 3 x 2.6 x 0.1 cm santyl Right foot medial: 1.1 x 0.8 cm calcium alginate    Psychiatric: He has a normal mood and affect.       ASSESSMENT/ PLAN:  1. Diabetic peripheral neuropathy: will continue cymbalta  90 mg daily will increase neurontin to 400 mg three times daily   2. PVD with venous ulcerations: will continue current  treatment and will continue to monitor his status.      Ok Edwards NP [facility]  Contact [phone] Monday through Friday 8am- 5pm  After hours call [phone]

## 2015-09-18 DIAGNOSIS — E10621 Type 1 diabetes mellitus with foot ulcer: Secondary | ICD-10-CM | POA: Diagnosis not present

## 2015-09-20 ENCOUNTER — Non-Acute Institutional Stay (SKILLED_NURSING_FACILITY): Payer: Medicare Other | Admitting: Internal Medicine

## 2015-09-20 ENCOUNTER — Encounter: Payer: Self-pay | Admitting: Internal Medicine

## 2015-09-20 DIAGNOSIS — I8312 Varicose veins of left lower extremity with inflammation: Secondary | ICD-10-CM

## 2015-09-20 DIAGNOSIS — F332 Major depressive disorder, recurrent severe without psychotic features: Secondary | ICD-10-CM

## 2015-09-20 DIAGNOSIS — I8311 Varicose veins of right lower extremity with inflammation: Secondary | ICD-10-CM

## 2015-09-20 DIAGNOSIS — I2782 Chronic pulmonary embolism: Secondary | ICD-10-CM

## 2015-09-20 DIAGNOSIS — Z0000 Encounter for general adult medical examination without abnormal findings: Secondary | ICD-10-CM

## 2015-09-20 DIAGNOSIS — F411 Generalized anxiety disorder: Secondary | ICD-10-CM | POA: Diagnosis not present

## 2015-09-20 DIAGNOSIS — J449 Chronic obstructive pulmonary disease, unspecified: Secondary | ICD-10-CM | POA: Diagnosis not present

## 2015-09-20 DIAGNOSIS — I872 Venous insufficiency (chronic) (peripheral): Secondary | ICD-10-CM

## 2015-09-20 DIAGNOSIS — I10 Essential (primary) hypertension: Secondary | ICD-10-CM | POA: Diagnosis not present

## 2015-09-20 DIAGNOSIS — E1142 Type 2 diabetes mellitus with diabetic polyneuropathy: Secondary | ICD-10-CM

## 2015-09-20 DIAGNOSIS — I5022 Chronic systolic (congestive) heart failure: Secondary | ICD-10-CM

## 2015-09-20 DIAGNOSIS — M150 Primary generalized (osteo)arthritis: Secondary | ICD-10-CM

## 2015-09-20 DIAGNOSIS — M159 Polyosteoarthritis, unspecified: Secondary | ICD-10-CM

## 2015-09-20 NOTE — Progress Notes (Signed)
DATE: 09/20/15  Location:  Nursing Home Location: [place] Room Number: [id number] Place of Service: SNF (31)   Extended Emergency Contact Information Primary Emergency Contact: Jarvell, Pyon Address: [address], [place] Mobile Phone: [phone] Relation: Spouse  Advanced Directive information Does patient have an advance directive?: Yes, Type of Advance Directive: Out of facility DNR (pink MOST or yellow form), Does patient want to make changes to advanced directive?: No - Patient declined  Chief Complaint  Patient presents with  . Annual Exam    Nursing home Yearly Exam    HPI:  75 yo male long term resident seen today for annual exam. He has pain in right leg and b/l lower back pain. He is on chronic pain meds. He c/o 2-3 week hx cough not relieved with prn cough med. He has not used prn nebs. No f/c. No N/V. No known sick contacts  Chronic systolic heart failure - stable on demadex  20 mg twice daily; takes ACEI    Hypertension - BP stable on zebeta 2.5 mg daily; lisinopril 2.5 mg daily  DM - CBG 135 today. A1c 7.4. He takes novolog 8 units prior to meals with an additional 5 units  for cbg >=150; NPH  22 units twice daily with meals. He has neuropathy and takes neurontin 400 mg TID and cymbalta 90 mg daily. He is on ACEI  CAD - he is s/p CABG and stents.  no complaints of chest pain present; takes zebeta 2.5 mg daily; lisinopril 2.5mg  daily; has not req'd prn ntg  GERD - stable on prilosec 40 mg daily   Hx CVA - stable. Takes xeralto  Hx PE - coumadin changed to xeralto earlier this month due to difficulty with INR stability. No bleeding   Venous stasis dermatitis/LE ulcer/PAD - followed by vascular sx. Facility wound care following venous ulcer  Hyperlipidemia - stable on zocor 80 mg daily. LDL 87  BPH - sx's stable on flomax 0.4 mg daily   Osteoarthritis/chronic pain -  Uses  lidoderm patch as directed to shoulder; norco 7.5/325 mg prior to therapy and at night with tid prn; aleve 2 tabs twice daily for his right hand pain. He was tx with 5 days of prednisone earlier this month. He is taking robaxin for muscle spasms  MDD/Anxiety/insomnia - mood stable on trazodone 50 mg nightly; takes ativan 0.25 mg nightly; melatonin 3 mg nightly for sleep; cymbalta 90 mg daily  Vit D deficiency - stable on vit d 50,000 units weekly   Constipation - due to long term narcotic use. Takes movantic 25 mg daily     Past Medical History  Diagnosis Date  . Depression   . Diabetes mellitus   . COPD (chronic obstructive pulmonary disease) ([place])   . Adhesive capsulitis   . MRSA bacteremia     2011 - possible endocarditis, received 6 weeks IV treatment  . Hyperlipidemia   . Anxiety   . GERD (gastroesophageal reflux disease)   . CAD (coronary artery disease)   . 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 CVA (cerebral infarction) Questionable history  . Diverticulosis   . Osteomyelitis ([place]) 2012    Sternoclavicular joint   . Chronic back pain   . Angina   . Myocardial infarction ([place])   . CHF (congestive heart failure) ([place])   .  Shortness of breath   . Sleep apnea   . Chronic kidney disease     hx of BPH  . Neuromuscular disorder ([place])     HX of diabetic periferal neuropathy  . Hypertension   . Peripheral vascular disease ([place])   . On home oxygen therapy     "1.5L prn" (04/12/2014)  . Falls frequently 06/2014    Past Surgical History  Procedure Laterality Date  . Septic arthritis      Removal of infected CABG wire by Dr Arlyce Dice - 2011  . Coronary artery bypass graft  1992  . Fracture surgery  1980s    Hip  . Spine surgery  2002    Cervical fusion vertebroplasty C3-4-5  . Coronary stent placement  1999, 2002  . Doppler echocardiography  Sept 2011    EF 50-55% with some impaired diastolic relaxation  . Colonoscopy  2006     Multiple polyps  removed, repeat in 5 years  . Right iliopopliteal bypass - [place]  . Femoral-popliteal bypass graft  2008    Left  . Cardiac catheterization  October 18, 2010    Known obstructive disease, no further blockages  . Cardiac catheterization  03/17/2005    EF 35-40%  . US echocardiography  03/04/2006    EF 50-55%  . Amputation  03/16/2011    Procedure: AMPUTATION DIGIT;  Surgeon: Angelia Mould, MD;  Location: [facility] OR;  Service: Vascular;  Laterality: Left;  Great toe  . Hip arthroplasty Left     Patient Care Team: Alveda Reasons, MD as PCP - General Darlin Coco, MD (Cardiology) Clent Jacks, MD (Ophthalmology)  Social History   Social History  . Marital Status: Married    Spouse Name: Butch Penny  . Number of Children: 3  . Years of Education: 12   Occupational History  . Retired-machinest/welder    Social History Main Topics  . Smoking status: Former Smoker    Quit date: 06/19/2000  . Smokeless tobacco: Never Used  . Alcohol Use: No  . Drug Use: No  . Sexual Activity: No   Other Topics Concern  . Not on file   Social History Narrative   Health Care POA: TBD   Emergency Contact: wife, Butch Penny [phone]   End of Life Plan: pt does not have AD and not interested.   Who lives with you: Butch Penny and step-daughter-Betty   Any pets: none   Diet: Pt has a varied diet of protein, starch and vegetables.   Exercise: Pt does not have regular exercise routine.   Seatbelts: Pt reports wearing seatbelt when in vehicles.    Hobbies: listening to car races.               reports that he quit smoking about 15 years ago. He has never used smokeless tobacco. He reports that he does not drink alcohol or use illicit drugs.  Family History  Problem Relation Age of Onset  . Heart disease Father   . Heart disease Mother    Family Status  Relation Status Death Age  . Father Deceased   . Mother Deceased     Immunization History  Administered Date(s) Administered  .  Influenza Split 12/12/2010  . Influenza Whole 12/22/2005, 12/14/2007  . Influenza,inj,Quad PF,36+ Mos 01/20/2013, 12/09/2013  . Influenza-Unspecified 12/15/2014  . Pneumococcal Conjugate-13 10/10/2013  . Pneumococcal Polysaccharide-23 11/23/1999, 03/25/2003, 05/25/2011  . Td 10/23/2003  . Tdap 07/17/2014    Allergies  Allergen Reactions  . Vancomycin  Other (See Comments)    "red man syndrome"  . Diazepam Anxiety    REACTION: makes patient cry   Health Maintenance reviewed.   Health Maintenance  Topic Date Due  . COLONOSCOPY  09/10/2009  . ZOSTAVAX  03/25/2023 (Originally 12/13/1998)  . INFLUENZA VACCINE  10/23/2015  . HEMOGLOBIN A1C  12/25/2015  . OPHTHALMOLOGY EXAM  03/12/2016  . FOOT EXAM  05/16/2016  . TETANUS/TDAP  07/16/2024  . PNA vac Low Risk Adult  Completed     Medications: Patient's Medications  New Prescriptions   No medications on file  Previous Medications   BISOPROLOL (ZEBETA) 5 MG TABLET    Take 0.5 tablets (2.5 mg total) by mouth daily.   DOCUSATE SODIUM (COLACE) 100 MG CAPSULE    Take 100 mg by mouth 2 (two) times daily.   DULOXETINE (CYMBALTA) 30 MG CAPSULE    Take 90 mg by mouth daily.   GABAPENTIN (NEURONTIN) 400 MG CAPSULE    Take 400 mg by mouth 3 (three) times daily.   HYDROCODONE-ACETAMINOPHEN (NORCO) 7.5-325 MG TABLET    Take 1 tablet by mouth every 6 (six) hours as needed for moderate pain.   INSULIN LISPRO (HUMALOG KWIKPEN) 100 UNIT/ML KIWKPEN    Inject 8 Units into the skin 3 (three) times daily before meals. An additional if CBG is 0-149=0 units, 150-400= 5 units before meals and at bedtime   INSULIN NPH HUMAN (HUMULIN N,NOVOLIN N) 100 UNIT/ML INJECTION    Inject 0.22 mLs (22 Units total) into the skin 2 (two) times daily before a meal.   IPRATROPIUM-ALBUTEROL (DUONEB) 0.5-2.5 (3) MG/3ML SOLN    Take 3 mLs by nebulization every 6 (six) hours as needed.   LIDOCAINE (LIDODERM) 5 %    Apply to shoulder topically two times daily for pain. Apply in  the am and remove in pm. Remove & Discard patch within 12 hours or as directed by MD   LISINOPRIL (PRINIVIL,ZESTRIL) 2.5 MG TABLET    Take 2.5 mg by mouth daily.    LORAZEPAM (ATIVAN) 0.5 MG TABLET    Take 0.5 tablets (0.25 mg total) by mouth at bedtime.   MECLIZINE (ANTIVERT) 25 MG TABLET    Take 1 tablet (25 mg total) by mouth 3 (three) times daily as needed for dizziness.   METHOCARBAMOL (ROBAXIN) 500 MG TABLET    Take 500 mg by mouth every 6 (six) hours as needed for muscle spasms.   MULTIPLE VITAMINS-MINERALS (DECUBI-VITE PO)    Take by mouth daily.   NALOXEGOL OXALATE (MOVANTIK) 25 MG TABS TABLET    Take 25 mg by mouth daily.   NAPROXEN SODIUM (ANAPROX) 220 MG TABLET    Take 440 mg by mouth 2 (two) times daily with a meal.   NITROGLYCERIN (NITROSTAT) 0.4 MG SL TABLET    Place 1 tablet (0.4 mg total) under the tongue as needed. For chest pains   OMEPRAZOLE (PRILOSEC) 40 MG CAPSULE    Take 1 capsule (40 mg total) by mouth daily.   PROPYLENE GLYCOL (SYSTANE BALANCE) 0.6 % SOLN    Apply 1 drop to eye 2 (two) times daily. Both eyes   RIVAROXABAN (XARELTO) 20 MG TABS TABLET    Take 20 mg by mouth daily with supper.   SIMVASTATIN (ZOCOR) 80 MG TABLET    Take 1 tablet (80 mg total) by mouth daily.   SKIN PROTECTANTS, MISC. (CALAZIME SKIN PROTECTANT EX)    Apple to sacrum and right buttock topically every day shift for excoriation  TAMSULOSIN (FLOMAX) 0.4 MG CAPS CAPSULE    Take 1 capsule (0.4 mg total) by mouth daily after supper.   TORSEMIDE (DEMADEX) 20 MG TABLET    Take 20 mg by mouth daily.   TRAZODONE (DESYREL) 100 MG TABLET    Take 100 mg by mouth at bedtime.    VITAMIN D, ERGOCALCIFEROL, (DRISDOL) 50000 UNITS CAPS CAPSULE    Take 1 capsule (50,000 Units total) by mouth every 7 (seven) days.  Modified Medications   No medications on file  Discontinued Medications   GABAPENTIN (NEURONTIN) 300 MG CAPSULE    Take 400 mg by mouth 3 (three) times daily. Reported on 09/20/2015   TORSEMIDE  (DEMADEX) 20 MG TABLET    Take 1 tablet (20 mg total) by mouth 2 (two) times daily.    Review of Systems  Respiratory: Positive for cough.   Cardiovascular: Positive for leg swelling.  Musculoskeletal: Positive for back pain, joint swelling, arthralgias, gait problem and neck pain.  Skin: Positive for wound.  Psychiatric/Behavioral: Positive for dysphoric mood.  All other systems reviewed and are negative.   Filed Vitals:   09/20/15 1110  BP: 128/64  Pulse: 88  Temp: 98.7 F (37.1 C)  TempSrc: Oral  Resp: 22  Height: 5\' 7"  (1.702 m)  Weight: 275 lb 1.6 oz (124.785 kg)  SpO2: 94%   Body mass index is 43.08 kg/(m^2).  Physical Exam  Constitutional: He is oriented to person, place, and time. He appears well-developed and well-nourished. No distress.  Sitting in w/c in NAD  HENT:  Head: Normocephalic and atraumatic.  Right Ear: Hearing, tympanic membrane, external ear and ear canal normal.  Left Ear: Hearing, tympanic membrane, external ear and ear canal normal.  Mouth/Throat: Uvula is midline, oropharynx is clear and moist and mucous membranes are normal. He does not have dentures.  Eyes: Conjunctivae, EOM and lids are normal. Pupils are equal, round, and reactive to light. No scleral icterus.  Neck: Trachea normal and normal range of motion. Neck supple. Carotid bruit is not present. No thyroid mass and no thyromegaly present.  Cardiovascular: Normal rate, regular rhythm and intact distal pulses.  Exam reveals no gallop and no friction rub.   Murmur (1/6 SEM) heard. No carotid bruit b/l. +1 pitting LE edema b/l. No calf TTP.   Pulmonary/Chest: Effort normal. He has wheezes (end expiratory with prolonged expiratory phase with cough). He has no rhonchi. He has no rales. Right breast exhibits no inverted nipple, no mass, no nipple discharge, no skin change and no tenderness. Left breast exhibits no inverted nipple, no mass, no nipple discharge, no skin change and no tenderness.  Breasts are symmetrical.  Abdominal: Soft. Normal appearance, normal aorta and bowel sounds are normal. He exhibits no pulsatile midline mass and no mass. There is no hepatosplenomegaly. There is no tenderness. There is no rigidity, no rebound and no guarding. No hernia.  obese  Musculoskeletal: He exhibits edema and tenderness.  Lymphadenopathy:       Head (right side): No posterior auricular adenopathy present.       Head (left side): No posterior auricular adenopathy present.    He has no cervical adenopathy.       Right: No supraclavicular adenopathy present.       Left: No supraclavicular adenopathy present.  Neurological: He is alert and oriented to person, place, and time. He has normal strength. No cranial nerve deficit. Gait normal.  Skin: Skin is warm, dry and intact. No rash noted. Nails show  no clubbing.  B/l LE dsg c/d/i - improving per wound care  Psychiatric: He has a normal mood and affect. His speech is normal and behavior is normal. Thought content normal. Cognition and memory are normal.     Labs reviewed: Nursing Home on 09/20/2015  Component Date Value Ref Range Status  . INR 08/13/2015 1.7* 0.9 - 1.1 Final  . Protime 08/13/2015 19.9* 10.0 - 13.8 seconds Final  . INR 08/01/2015 3.5* 0.9 - 1.1 Final  . Hemoglobin 07/26/2015 12.3* 13.5 - 17.5 g/dL Final  . HCT 07/26/2015 37* 41 - 53 % Final  . Platelets 07/26/2015 106* 150 - 399 K/L Final  . WBC 07/26/2015 7.7   Final  . Protime 08/01/2015 35.5* 10.0 - 13.8 seconds Final  . Glucose 07/26/2015 161   Final  . BUN 07/26/2015 37* 4 - 21 mg/dL Final  . Creatinine 07/26/2015 1.6* 0.6 - 1.3 mg/dL Final  . Potassium 07/26/2015 4.3  3.4 - 5.3 mmol/L Final  . Sodium 07/26/2015 136* 137 - 147 mmol/L Final  Nursing Home on 07/23/2015  Component Date Value Ref Range Status  . INR 07/09/2015 1.8* 0.9 - 1.1 Final  . Protime 07/09/2015 21.0* 10.0 - 13.8 seconds Final    No results found.   Assessment/Plan   ICD-9-CM  ICD-10-CM   1. Well adult exam V70.0 Z00.00   2. Chronic obstructive pulmonary disease, unspecified COPD type (HCC) - mild exacerbation 496 J44.9   3. Chronic systolic heart failure (HCC) 428.22 I50.22   4. Primary osteoarthritis involving multiple joints 715.09 M15.0   5. Diabetic peripheral neuropathy associated with type 2 diabetes mellitus (HCC) 250.60 E11.42    357.2    6. Essential hypertension 401.9 I10   7. Severe episode of recurrent major depressive disorder, without psychotic features ([place]) 296.33 F33.2   8. GAD (generalized anxiety disorder) 300.02 F41.1   9. Venous stasis dermatitis of both lower extremities 454.1 I83.11     I83.12   10. Other chronic pulmonary embolism without acute cor pulmonale (HCC)  I27.82    Check A1c, TSH and lipid panel  Pt is UTD on health maintenance. Vaccinations are UTD. Pt maintains a mostly healthy lifestyle. Encouraged him to exercise 30-45 minutes 4-5 times per week as tolerated. Eat a well balanced diet. Avoid smoking. Limit alcohol intake. Wear seatbelt when riding in the car. Wear sun block (SPF >50) when spending extended times outside.  Change duonebs to TID x 1 wk -->BID x 1 wk-->daily x 1 wk-->q6hrs prn SOB, wheeze or cough  Cont other meds as ordered  PT/OT/ST as indicated  F/u with facility wound care as ordered   F/u with specialists as scheduled  Will follow  Berdena Cisek S. Perlie Gold  [facility] [address] [place], [place] [address] [phone] Cell (Monday-Friday 8 AM - 5 PM) [phone] After 5 PM and follow prompts

## 2015-09-21 LAB — LIPID PANEL
CHOLESTEROL: 104 mg/dL (ref 0–200)
HDL: 28 mg/dL — AB (ref 35–70)
LDL Cholesterol: 56 mg/dL
Triglycerides: 100 mg/dL (ref 40–160)

## 2015-10-02 ENCOUNTER — Encounter (HOSPITAL_BASED_OUTPATIENT_CLINIC_OR_DEPARTMENT_OTHER): Payer: Medicare Other | Attending: Surgery

## 2015-10-02 DIAGNOSIS — I509 Heart failure, unspecified: Secondary | ICD-10-CM | POA: Diagnosis not present

## 2015-10-02 DIAGNOSIS — I2510 Atherosclerotic heart disease of native coronary artery without angina pectoris: Secondary | ICD-10-CM | POA: Insufficient documentation

## 2015-10-02 DIAGNOSIS — L97821 Non-pressure chronic ulcer of other part of left lower leg limited to breakdown of skin: Secondary | ICD-10-CM | POA: Diagnosis not present

## 2015-10-02 DIAGNOSIS — E11622 Type 2 diabetes mellitus with other skin ulcer: Secondary | ICD-10-CM | POA: Insufficient documentation

## 2015-10-02 DIAGNOSIS — E1151 Type 2 diabetes mellitus with diabetic peripheral angiopathy without gangrene: Secondary | ICD-10-CM | POA: Insufficient documentation

## 2015-10-02 DIAGNOSIS — L97511 Non-pressure chronic ulcer of other part of right foot limited to breakdown of skin: Secondary | ICD-10-CM | POA: Diagnosis not present

## 2015-10-02 DIAGNOSIS — L89613 Pressure ulcer of right heel, stage 3: Secondary | ICD-10-CM | POA: Diagnosis not present

## 2015-10-02 DIAGNOSIS — L89620 Pressure ulcer of left heel, unstageable: Secondary | ICD-10-CM | POA: Diagnosis not present

## 2015-10-02 DIAGNOSIS — E11621 Type 2 diabetes mellitus with foot ulcer: Secondary | ICD-10-CM | POA: Insufficient documentation

## 2015-10-02 DIAGNOSIS — I87312 Chronic venous hypertension (idiopathic) with ulcer of left lower extremity: Secondary | ICD-10-CM | POA: Insufficient documentation

## 2015-10-02 DIAGNOSIS — I70234 Atherosclerosis of native arteries of right leg with ulceration of heel and midfoot: Secondary | ICD-10-CM | POA: Insufficient documentation

## 2015-10-02 DIAGNOSIS — J449 Chronic obstructive pulmonary disease, unspecified: Secondary | ICD-10-CM | POA: Insufficient documentation

## 2015-10-02 DIAGNOSIS — I110 Hypertensive heart disease with heart failure: Secondary | ICD-10-CM | POA: Diagnosis not present

## 2015-10-09 DIAGNOSIS — E11621 Type 2 diabetes mellitus with foot ulcer: Secondary | ICD-10-CM | POA: Diagnosis not present

## 2015-10-11 ENCOUNTER — Encounter: Payer: Self-pay | Admitting: Family

## 2015-10-12 ENCOUNTER — Encounter (HOSPITAL_COMMUNITY): Payer: Medicare Other

## 2015-10-12 ENCOUNTER — Ambulatory Visit: Payer: Medicare Other | Admitting: Family

## 2015-10-16 DIAGNOSIS — E11621 Type 2 diabetes mellitus with foot ulcer: Secondary | ICD-10-CM | POA: Diagnosis not present

## 2015-10-19 ENCOUNTER — Ambulatory Visit (HOSPITAL_COMMUNITY)
Admission: RE | Admit: 2015-10-19 | Discharge: 2015-10-19 | Disposition: A | Discharge status: Home / Self Care | Payer: Medicare Other | Source: Ambulatory Visit | Attending: Family | Admitting: Family

## 2015-10-19 ENCOUNTER — Encounter (HOSPITAL_COMMUNITY): Payer: Self-pay

## 2015-10-19 ENCOUNTER — Emergency Department (HOSPITAL_COMMUNITY): Payer: Medicare Other

## 2015-10-19 ENCOUNTER — Inpatient Hospital Stay (HOSPITAL_COMMUNITY)
Admission: EM | Admit: 2015-10-19 | Discharge: 2015-10-29 | DRG: 291 | Disposition: A | Discharge status: Skilled Nursing Facility | Payer: Medicare Other | Attending: Internal Medicine | Admitting: Internal Medicine

## 2015-10-19 ENCOUNTER — Other Ambulatory Visit: Payer: Self-pay

## 2015-10-19 ENCOUNTER — Other Ambulatory Visit (HOSPITAL_COMMUNITY): Payer: Self-pay

## 2015-10-19 ENCOUNTER — Ambulatory Visit (INDEPENDENT_AMBULATORY_CARE_PROVIDER_SITE_OTHER): Payer: Medicare Other | Admitting: Family

## 2015-10-19 ENCOUNTER — Encounter: Payer: Self-pay | Admitting: Family

## 2015-10-19 ENCOUNTER — Ambulatory Visit (INDEPENDENT_AMBULATORY_CARE_PROVIDER_SITE_OTHER)
Admission: RE | Admit: 2015-10-19 | Discharge: 2015-10-19 | Disposition: A | Discharge status: Home / Self Care | Payer: Medicare Other | Source: Ambulatory Visit | Attending: Family | Admitting: Family

## 2015-10-19 VITALS — BP 126/58 | HR 101 | Temp 98.7°F | Resp 24 | Ht 68.0 in | Wt 282.0 lb

## 2015-10-19 DIAGNOSIS — E1149 Type 2 diabetes mellitus with other diabetic neurological complication: Secondary | ICD-10-CM | POA: Diagnosis present

## 2015-10-19 DIAGNOSIS — E1151 Type 2 diabetes mellitus with diabetic peripheral angiopathy without gangrene: Secondary | ICD-10-CM | POA: Diagnosis not present

## 2015-10-19 DIAGNOSIS — F411 Generalized anxiety disorder: Secondary | ICD-10-CM | POA: Diagnosis present

## 2015-10-19 DIAGNOSIS — I130 Hypertensive heart and chronic kidney disease with heart failure and stage 1 through stage 4 chronic kidney disease, or unspecified chronic kidney disease: Principal | ICD-10-CM | POA: Diagnosis present

## 2015-10-19 DIAGNOSIS — I739 Peripheral vascular disease, unspecified: Secondary | ICD-10-CM | POA: Diagnosis not present

## 2015-10-19 DIAGNOSIS — D509 Iron deficiency anemia, unspecified: Secondary | ICD-10-CM | POA: Diagnosis present

## 2015-10-19 DIAGNOSIS — L8990 Pressure ulcer of unspecified site, unspecified stage: Secondary | ICD-10-CM | POA: Insufficient documentation

## 2015-10-19 DIAGNOSIS — I2510 Atherosclerotic heart disease of native coronary artery without angina pectoris: Secondary | ICD-10-CM

## 2015-10-19 DIAGNOSIS — F419 Anxiety disorder, unspecified: Secondary | ICD-10-CM | POA: Insufficient documentation

## 2015-10-19 DIAGNOSIS — J441 Chronic obstructive pulmonary disease with (acute) exacerbation: Secondary | ICD-10-CM | POA: Diagnosis present

## 2015-10-19 DIAGNOSIS — K219 Gastro-esophageal reflux disease without esophagitis: Secondary | ICD-10-CM | POA: Diagnosis present

## 2015-10-19 DIAGNOSIS — I5023 Acute on chronic systolic (congestive) heart failure: Secondary | ICD-10-CM | POA: Diagnosis not present

## 2015-10-19 DIAGNOSIS — I252 Old myocardial infarction: Secondary | ICD-10-CM

## 2015-10-19 DIAGNOSIS — Z881 Allergy status to other antibiotic agents status: Secondary | ICD-10-CM

## 2015-10-19 DIAGNOSIS — Z96642 Presence of left artificial hip joint: Secondary | ICD-10-CM | POA: Diagnosis present

## 2015-10-19 DIAGNOSIS — E1165 Type 2 diabetes mellitus with hyperglycemia: Secondary | ICD-10-CM | POA: Diagnosis present

## 2015-10-19 DIAGNOSIS — R296 Repeated falls: Secondary | ICD-10-CM | POA: Diagnosis present

## 2015-10-19 DIAGNOSIS — I509 Heart failure, unspecified: Secondary | ICD-10-CM | POA: Insufficient documentation

## 2015-10-19 DIAGNOSIS — I10 Essential (primary) hypertension: Secondary | ICD-10-CM | POA: Diagnosis not present

## 2015-10-19 DIAGNOSIS — I6522 Occlusion and stenosis of left carotid artery: Secondary | ICD-10-CM

## 2015-10-19 DIAGNOSIS — G4730 Sleep apnea, unspecified: Secondary | ICD-10-CM

## 2015-10-19 DIAGNOSIS — R0600 Dyspnea, unspecified: Secondary | ICD-10-CM | POA: Diagnosis present

## 2015-10-19 DIAGNOSIS — Z888 Allergy status to other drugs, medicaments and biological substances status: Secondary | ICD-10-CM

## 2015-10-19 DIAGNOSIS — I872 Venous insufficiency (chronic) (peripheral): Secondary | ICD-10-CM | POA: Diagnosis present

## 2015-10-19 DIAGNOSIS — F329 Major depressive disorder, single episode, unspecified: Secondary | ICD-10-CM | POA: Diagnosis present

## 2015-10-19 DIAGNOSIS — Z955 Presence of coronary angioplasty implant and graft: Secondary | ICD-10-CM

## 2015-10-19 DIAGNOSIS — E7800 Pure hypercholesterolemia, unspecified: Secondary | ICD-10-CM | POA: Diagnosis present

## 2015-10-19 DIAGNOSIS — Z951 Presence of aortocoronary bypass graft: Secondary | ICD-10-CM

## 2015-10-19 DIAGNOSIS — I87339 Chronic venous hypertension (idiopathic) with ulcer and inflammation of unspecified lower extremity: Secondary | ICD-10-CM

## 2015-10-19 DIAGNOSIS — Z8673 Personal history of transient ischemic attack (TIA), and cerebral infarction without residual deficits: Secondary | ICD-10-CM

## 2015-10-19 DIAGNOSIS — E1122 Type 2 diabetes mellitus with diabetic chronic kidney disease: Secondary | ICD-10-CM | POA: Diagnosis present

## 2015-10-19 DIAGNOSIS — Z9981 Dependence on supplemental oxygen: Secondary | ICD-10-CM | POA: Diagnosis not present

## 2015-10-19 DIAGNOSIS — Z87891 Personal history of nicotine dependence: Secondary | ICD-10-CM

## 2015-10-19 DIAGNOSIS — Z86711 Personal history of pulmonary embolism: Secondary | ICD-10-CM

## 2015-10-19 DIAGNOSIS — G8929 Other chronic pain: Secondary | ICD-10-CM | POA: Diagnosis present

## 2015-10-19 DIAGNOSIS — E876 Hypokalemia: Secondary | ICD-10-CM

## 2015-10-19 DIAGNOSIS — M7989 Other specified soft tissue disorders: Secondary | ICD-10-CM | POA: Diagnosis not present

## 2015-10-19 DIAGNOSIS — E785 Hyperlipidemia, unspecified: Secondary | ICD-10-CM

## 2015-10-19 DIAGNOSIS — J449 Chronic obstructive pulmonary disease, unspecified: Secondary | ICD-10-CM | POA: Diagnosis present

## 2015-10-19 DIAGNOSIS — I779 Disorder of arteries and arterioles, unspecified: Secondary | ICD-10-CM

## 2015-10-19 DIAGNOSIS — Z79899 Other long term (current) drug therapy: Secondary | ICD-10-CM

## 2015-10-19 DIAGNOSIS — F32A Depression, unspecified: Secondary | ICD-10-CM | POA: Diagnosis present

## 2015-10-19 DIAGNOSIS — L89613 Pressure ulcer of right heel, stage 3: Secondary | ICD-10-CM | POA: Diagnosis present

## 2015-10-19 DIAGNOSIS — M549 Dorsalgia, unspecified: Secondary | ICD-10-CM | POA: Diagnosis present

## 2015-10-19 DIAGNOSIS — L97909 Non-pressure chronic ulcer of unspecified part of unspecified lower leg with unspecified severity: Secondary | ICD-10-CM | POA: Diagnosis present

## 2015-10-19 DIAGNOSIS — N189 Chronic kidney disease, unspecified: Secondary | ICD-10-CM | POA: Insufficient documentation

## 2015-10-19 DIAGNOSIS — I255 Ischemic cardiomyopathy: Secondary | ICD-10-CM | POA: Diagnosis present

## 2015-10-19 DIAGNOSIS — I2782 Chronic pulmonary embolism: Secondary | ICD-10-CM | POA: Diagnosis present

## 2015-10-19 DIAGNOSIS — Z7901 Long term (current) use of anticoagulants: Secondary | ICD-10-CM | POA: Diagnosis not present

## 2015-10-19 DIAGNOSIS — J9621 Acute and chronic respiratory failure with hypoxia: Secondary | ICD-10-CM | POA: Diagnosis present

## 2015-10-19 DIAGNOSIS — Z89412 Acquired absence of left great toe: Secondary | ICD-10-CM

## 2015-10-19 DIAGNOSIS — Z981 Arthrodesis status: Secondary | ICD-10-CM

## 2015-10-19 DIAGNOSIS — N400 Enlarged prostate without lower urinary tract symptoms: Secondary | ICD-10-CM | POA: Diagnosis present

## 2015-10-19 DIAGNOSIS — Z66 Do not resuscitate: Secondary | ICD-10-CM | POA: Diagnosis present

## 2015-10-19 DIAGNOSIS — J9620 Acute and chronic respiratory failure, unspecified whether with hypoxia or hypercapnia: Secondary | ICD-10-CM | POA: Diagnosis not present

## 2015-10-19 DIAGNOSIS — M79609 Pain in unspecified limb: Secondary | ICD-10-CM | POA: Diagnosis not present

## 2015-10-19 DIAGNOSIS — E119 Type 2 diabetes mellitus without complications: Secondary | ICD-10-CM | POA: Diagnosis present

## 2015-10-19 DIAGNOSIS — Z7951 Long term (current) use of inhaled steroids: Secondary | ICD-10-CM

## 2015-10-19 DIAGNOSIS — R0603 Acute respiratory distress: Secondary | ICD-10-CM

## 2015-10-19 DIAGNOSIS — R0902 Hypoxemia: Secondary | ICD-10-CM | POA: Diagnosis not present

## 2015-10-19 DIAGNOSIS — I5043 Acute on chronic combined systolic (congestive) and diastolic (congestive) heart failure: Secondary | ICD-10-CM | POA: Diagnosis not present

## 2015-10-19 DIAGNOSIS — L89620 Pressure ulcer of left heel, unstageable: Secondary | ICD-10-CM | POA: Diagnosis present

## 2015-10-19 DIAGNOSIS — Z794 Long term (current) use of insulin: Secondary | ICD-10-CM

## 2015-10-19 DIAGNOSIS — Z8249 Family history of ischemic heart disease and other diseases of the circulatory system: Secondary | ICD-10-CM

## 2015-10-19 DIAGNOSIS — E11622 Type 2 diabetes mellitus with other skin ulcer: Secondary | ICD-10-CM | POA: Diagnosis present

## 2015-10-19 LAB — BASIC METABOLIC PANEL
ANION GAP: 7 (ref 5–15)
BUN: 15 mg/dL (ref 6–20)
CHLORIDE: 98 mmol/L — AB (ref 101–111)
CO2: 29 mmol/L (ref 22–32)
Calcium: 8.4 mg/dL — ABNORMAL LOW (ref 8.9–10.3)
Creatinine, Ser: 0.93 mg/dL (ref 0.61–1.24)
Glucose, Bld: 123 mg/dL — ABNORMAL HIGH (ref 65–99)
POTASSIUM: 4.8 mmol/L (ref 3.5–5.1)
SODIUM: 134 mmol/L — AB (ref 135–145)

## 2015-10-19 LAB — CBC WITH DIFFERENTIAL/PLATELET
BASOS ABS: 0 10*3/uL (ref 0.0–0.1)
Basophils Relative: 0 %
EOS PCT: 1 %
Eosinophils Absolute: 0.1 10*3/uL (ref 0.0–0.7)
HCT: 31 % — ABNORMAL LOW (ref 39.0–52.0)
HEMOGLOBIN: 8.7 g/dL — AB (ref 13.0–17.0)
LYMPHS ABS: 1.4 10*3/uL (ref 0.7–4.0)
Lymphocytes Relative: 10 %
MCH: 19.9 pg — AB (ref 26.0–34.0)
MCHC: 28.1 g/dL — ABNORMAL LOW (ref 30.0–36.0)
MCV: 70.9 fL — ABNORMAL LOW (ref 78.0–100.0)
MONO ABS: 1.1 10*3/uL — AB (ref 0.1–1.0)
Monocytes Relative: 8 %
NEUTROS PCT: 81 %
Neutro Abs: 11.2 10*3/uL — ABNORMAL HIGH (ref 1.7–7.7)
PLATELETS: 336 10*3/uL (ref 150–400)
RBC: 4.37 MIL/uL (ref 4.22–5.81)
RDW: 17.8 % — ABNORMAL HIGH (ref 11.5–15.5)
WBC: 13.8 10*3/uL — AB (ref 4.0–10.5)

## 2015-10-19 LAB — VAS US CAROTID
LCCADDIAS: 17 cm/s
LEFT ECA DIAS: 9 cm/s
LICADDIAS: -26 cm/s
LICAPDIAS: 11 cm/s
Left CCA dist sys: 68 cm/s
Left CCA prox dias: 19 cm/s
Left CCA prox sys: 83 cm/s
Left ICA dist sys: -64 cm/s
Left ICA prox sys: 47 cm/s
RCCADSYS: -86 cm/s
RCCAPDIAS: 13 cm/s
RCCAPSYS: 91 cm/s
RIGHT CCA MID DIAS: 19 cm/s
RIGHT ECA DIAS: 1 cm/s

## 2015-10-19 LAB — I-STAT TROPONIN, ED: TROPONIN I, POC: 0 ng/mL (ref 0.00–0.08)

## 2015-10-19 LAB — PROTIME-INR
INR: 1.39
PROTHROMBIN TIME: 17.2 s — AB (ref 11.4–15.2)

## 2015-10-19 LAB — BRAIN NATRIURETIC PEPTIDE: B NATRIURETIC PEPTIDE 5: 446.6 pg/mL — AB (ref 0.0–100.0)

## 2015-10-19 MED ORDER — IOPAMIDOL (ISOVUE-370) INJECTION 76%
INTRAVENOUS | Status: AC
  Administered 2015-10-19: 100 mL
  Filled 2015-10-19: qty 100

## 2015-10-19 MED ORDER — FUROSEMIDE 10 MG/ML IJ SOLN
40.0000 mg | Freq: Once | INTRAMUSCULAR | Status: AC
  Administered 2015-10-19: 40 mg via INTRAVENOUS
  Filled 2015-10-19: qty 4

## 2015-10-19 MED ORDER — HYDROCODONE-ACETAMINOPHEN 5-325 MG PO TABS
1.0000 | ORAL_TABLET | Freq: Once | ORAL | Status: AC
  Administered 2015-10-19: 1 via ORAL
  Filled 2015-10-19: qty 1

## 2015-10-19 NOTE — ED Provider Notes (Signed)
[facility] DEPT Provider Note   CSN: [id number] Arrival date & time: 10/19/15  1549  First Provider Contact:  First MD Initiated Contact with Patient 10/19/15 1552      History   Chief Complaint Chief Complaint  Patient presents with  . Shortness of Breath  . Cough    HPI [name] is a 75 y.o. male.  The history is provided by the patient and medical records. No language interpreter was used.  Shortness of Breath  This is a new problem. The average episode lasts 2 weeks. The problem occurs intermittently.The current episode started more than 1 week ago. The problem has been gradually worsening. Associated symptoms include cough, sputum production and leg swelling. Pertinent negatives include no fever, no sore throat, no ear pain, no wheezing, no orthopnea, no chest pain, no syncope, no vomiting and no abdominal pain. He has tried nothing for the symptoms. The treatment provided no relief. Associated medical issues include COPD, PE and heart failure.  Cough  Associated symptoms include shortness of breath. Pertinent negatives include no chest pain, no chills, no ear pain, no sore throat, no wheezing and no eye redness. His past medical history is significant for COPD.    Past Medical History:  Diagnosis Date  . Adhesive capsulitis   . Angina   . Anxiety   . CAD (coronary artery disease)   . CHF (congestive heart failure) ([place])   . Chronic back pain   . Chronic kidney disease    hx of BPH  . COPD (chronic obstructive pulmonary disease) ([place])   . CVA (cerebral infarction) Questionable history  . Depression   . Diabetes mellitus   . Diverticulosis   . Falls frequently 06/2014  . GERD (gastroesophageal reflux disease)   . Hyperlipidemia   . Hypertension   . 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 Myocardial infarction ([place])   . Neuromuscular disorder ([place])    HX of diabetic periferal neuropathy  . On home oxygen therapy     "1.5L prn" (04/12/2014)  . Osteomyelitis ([place]) 2012   Sternoclavicular joint   . PE (pulmonary embolism) 10-15 years ago   Lifelong Coumadin  . Peripheral vascular disease ([place])   . Shortness of breath   . Sleep apnea     Patient Active Problem List   Diagnosis Date Noted  . Acute on chronic systolic (congestive) heart failure ([place]) 10/19/2015  . Acute on chronic respiratory failure with hypoxia ([place]) 10/19/2015  . Microcytic anemia 10/19/2015  . Atherosclerosis of native arteries of the extremities with ulceration ([place]) 08/03/2015  . Low back pain with sciatica 07/30/2015  . 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 Diabetic peripheral neuropathy associated with type 2 diabetes mellitus ([place]) 06/18/2015  . Allergic rhinitis 05/22/2015  . Cellulitis of left lower leg 05/22/2015  . Venous ulcer of left leg ([place]) 05/22/2015  . Constipation due to opioid therapy 05/06/2015  . Vitamin D deficiency 05/03/2015  . Type II diabetes mellitus with neurological manifestations ([place]) 02/15/2015  . Dyslipidemia associated with type 2 diabetes mellitus ([place]) 02/15/2015  . Bilateral edema of lower extremity   . Dehydration 07/18/2014  . Diarrhea 07/18/2014  . Benign paroxysmal positional vertigo 05/26/2014  . OA (osteoarthritis) 05/12/2014  . Unsteady gait 04/12/2014  . Orthostatic hypotension 05/16/2013  . Generalized weakness 04/30/2013  . At high risk for falls 07/19/2012  . Falls 02/17/2012  . Bradycardia 01/22/2012  . Hemorrhoid 01/12/2012  .  GERD (gastroesophageal reflux disease) 11/10/2011  . Leg edema 09/20/2011  . Peripheral vascular disease ([place]) 04/16/2011  . COPD (chronic obstructive pulmonary disease) ([place]) 02/27/2011  . Stasis dermatitis 12/04/2010  . BPH (benign prostatic hyperplasia) 11/01/2010  . Long term current use of anticoagulant therapy 05/03/2010  . FROZEN RIGHT SHOULDER 02/25/2010  . Depression 07/08/2006  . Essential hypertension 07/08/2006  .  HYPERCHOLESTEROLEMIA 05/21/2006  . Anxiety state 05/21/2006  . Coronary atherosclerosis 05/21/2006  . Chronic systolic heart failure ([place]) 05/21/2006  . APNEA, SLEEP 05/21/2006    Past Surgical History:  Procedure Laterality Date  . AMPUTATION  03/16/2011   Procedure: AMPUTATION DIGIT;  Surgeon: Angelia Mould, MD;  Location: [facility] OR;  Service: Vascular;  Laterality: Left;  Great toe  . CARDIAC CATHETERIZATION  October 18, 2010   Known obstructive disease, no further blockages  . CARDIAC CATHETERIZATION  03/17/2005   EF 35-40%  . COLONOSCOPY  2006    Multiple polyps removed, repeat in 5 years  . CORONARY ARTERY BYPASS GRAFT  1992  . [place], 2002  . DOPPLER ECHOCARDIOGRAPHY  Sept 2011   EF 50-55% with some impaired diastolic relaxation  . FEMORAL-POPLITEAL BYPASS GRAFT  2008   Left  . FRACTURE SURGERY  1980s   Hip  . HIP ARTHROPLASTY Left   . Right iliopopliteal bypass - [place]  . septic arthritis     Removal of infected CABG wire by Dr Arlyce Dice - 2011  . SPINE SURGERY  2002   Cervical fusion vertebroplasty C3-4-5  . US ECHOCARDIOGRAPHY  03/04/2006   EF 50-55%       Home Medications    Prior to Admission medications   Medication Sig Start Date End Date Taking? Authorizing Provider  bisoprolol (ZEBETA) 5 MG tablet Take 0.5 tablets (2.5 mg total) by mouth daily. 05/10/14   Alveda Reasons, MD  collagenase (SANTYL) ointment Apply 1 application topically daily.    Historical Provider, MD  docusate sodium (COLACE) 100 MG capsule Take 100 mg by mouth 2 (two) times daily.    Historical Provider, MD  DULoxetine (CYMBALTA) 30 MG capsule Take 90 mg by mouth daily.    Historical Provider, MD  gabapentin (NEURONTIN) 400 MG capsule Take 400 mg by mouth 3 (three) times daily.    Historical Provider, MD  HYDROcodone-acetaminophen (NORCO) 7.5-325 MG tablet Take 1 tablet by mouth every 6 (six) hours as needed for moderate pain.    Historical Provider, MD    insulin lispro (HUMALOG KWIKPEN) 100 UNIT/ML KiwkPen Inject 8 Units into the skin 3 (three) times daily before meals. An additional if CBG is 0-149=0 units, 150-400= 5 units before meals and at bedtime    Historical Provider, MD  insulin NPH Human (HUMULIN N,NOVOLIN N) 100 UNIT/ML injection Inject 0.22 mLs (22 Units total) into the skin 2 (two) times daily before a meal. 02/15/15   Gerlene Fee, NP  ipratropium-albuterol (DUONEB) 0.5-2.5 (3) MG/3ML SOLN Take 3 mLs by nebulization every 6 (six) hours as needed.    Historical Provider, MD  lidocaine (LIDODERM) 5 % Apply to shoulder topically two times daily for pain. Apply in the am and remove in pm. Remove & Discard patch within 12 hours or as directed by MD    Historical Provider, MD  lisinopril (PRINIVIL,ZESTRIL) 2.5 MG tablet Take 2.5 mg by mouth daily.  06/06/14   Historical Provider, MD  LORazepam (ATIVAN) 0.5 MG tablet Take 0.5 tablets (0.25 mg total) by mouth  at bedtime. 07/06/14   Alveda Reasons, MD  lubiprostone (AMITIZA) 8 MCG capsule Take 8 mcg by mouth 2 (two) times daily with a meal.    Historical Provider, MD  meclizine (ANTIVERT) 25 MG tablet Take 1 tablet (25 mg total) by mouth 3 (three) times daily as needed for dizziness. 08/29/14   Alveda Reasons, MD  methocarbamol (ROBAXIN) 500 MG tablet Take 500 mg by mouth every 6 (six) hours as needed for muscle spasms.    Historical Provider, MD  Multiple Vitamins-Minerals (DECUBI-VITE PO) Take by mouth daily.    Historical Provider, MD  naloxegol oxalate (MOVANTIK) 25 MG TABS tablet Take 25 mg by mouth daily.    Historical Provider, MD  naproxen sodium (ANAPROX) 220 MG tablet Take 440 mg by mouth 2 (two) times daily with a meal.    Historical Provider, MD  nitroGLYCERIN (NITROSTAT) 0.4 MG SL tablet Place 1 tablet (0.4 mg total) under the tongue as needed. For chest pains 10/19/13   Alveda Reasons, MD  omeprazole (PRILOSEC) 40 MG capsule Take 1 capsule (40 mg total) by mouth daily. 05/10/14    Alveda Reasons, MD  Propylene Glycol (SYSTANE BALANCE) 0.6 % SOLN Apply 1 drop to eye 2 (two) times daily. Both eyes    Historical Provider, MD  rivaroxaban (XARELTO) 20 MG TABS tablet Take 20 mg by mouth daily with supper.    Historical Provider, MD  simvastatin (ZOCOR) 80 MG tablet Take 1 tablet (80 mg total) by mouth daily. 06/06/14   Alveda Reasons, MD  Skin Protectants, Misc. (CALAZIME SKIN PROTECTANT EX) Apple to sacrum and right buttock topically every day shift for excoriation    Historical Provider, MD  tamsulosin (FLOMAX) 0.4 MG CAPS capsule Take 1 capsule (0.4 mg total) by mouth daily after supper. 10/19/13   Alveda Reasons, MD  torsemide (DEMADEX) 20 MG tablet Take 20 mg by mouth daily.    Historical Provider, MD  traZODone (DESYREL) 100 MG tablet Take 100 mg by mouth at bedtime.     Historical Provider, MD  Vitamin D, Ergocalciferol, (DRISDOL) 50000 units CAPS capsule Take 1 capsule (50,000 Units total) by mouth every 7 (seven) days. 05/03/15   Gerlene Fee, NP    Family History Family History  Problem Relation Age of Onset  . Heart disease Father   . Heart disease Mother     Social History Social History  Substance Use Topics  . Smoking status: Former Smoker    Quit date: 06/19/2000  . Smokeless tobacco: Never Used  . Alcohol use No     Allergies   Vancomycin and Diazepam   Review of Systems Review of Systems  Constitutional: Positive for activity change and fatigue. Negative for appetite change, chills and fever.  HENT: Positive for congestion. Negative for dental problem, ear pain, sore throat and trouble swallowing.   Eyes: Negative for discharge, redness and itching.  Respiratory: Positive for cough, sputum production and shortness of breath. Negative for wheezing.   Cardiovascular: Positive for leg swelling. Negative for chest pain, palpitations, orthopnea and syncope.  Gastrointestinal: Negative for abdominal distention, abdominal pain and vomiting.    Genitourinary: Negative for difficulty urinating and dysuria.  Musculoskeletal: Negative for arthralgias and back pain.  Skin: Negative for color change and pallor.  Psychiatric/Behavioral: Negative for agitation and behavioral problems.  All other systems reviewed and are negative.    Physical Exam Updated Vital Signs BP 101/60   Pulse 92   Resp 20  SpO2 99%   Physical Exam  Constitutional: He is oriented to person, place, and time. No distress.  HENT:  Head: Normocephalic and atraumatic.  Eyes: EOM are normal. Pupils are equal, round, and reactive to light.  Neck: Normal range of motion. Neck supple.  Cardiovascular: Regular rhythm and normal pulses.  Tachycardia present.   Pulmonary/Chest: No respiratory distress. He has rales. He exhibits no tenderness.  Abdominal: Soft. Bowel sounds are normal. He exhibits no distension. There is no tenderness. There is no rebound and no guarding.  Musculoskeletal: Normal range of motion. He exhibits edema. He exhibits no tenderness.  Neurological: He is alert and oriented to person, place, and time.  Skin: Skin is warm and dry. Capillary refill takes less than 2 seconds. He is not diaphoretic.  Psychiatric: He has a normal mood and affect. His behavior is normal.  Nursing note and vitals reviewed.    ED Treatments / Results  Labs (all labs ordered are listed, but only abnormal results are displayed) Labs Reviewed  CBC WITH DIFFERENTIAL/PLATELET - Abnormal; Notable for the following:       Result Value   WBC 13.8 (*)    Hemoglobin 8.7 (*)    HCT 31.0 (*)    MCV 70.9 (*)    MCH 19.9 (*)    MCHC 28.1 (*)    RDW 17.8 (*)    Neutro Abs 11.2 (*)    Monocytes Absolute 1.1 (*)    All other components within normal limits  BASIC METABOLIC PANEL - Abnormal; Notable for the following:    Sodium 134 (*)    Chloride 98 (*)    Glucose, Bld 123 (*)    Calcium 8.4 (*)    All other components within normal limits  PROTIME-INR - Abnormal;  Notable for the following:    Prothrombin Time 17.2 (*)    All other components within normal limits  BRAIN NATRIURETIC PEPTIDE - Abnormal; Notable for the following:    B Natriuretic Peptide 446.6 (*)    All other components within normal limits  I-STAT TROPOININ, ED  I-STAT TROPOININ, ED  POC OCCULT BLOOD, ED  Randolm Idol, ED    EKG  EKG Interpretation None       Radiology Dg Chest 2 View  Result Date: 10/19/2015 CLINICAL DATA:  Productive cough. EXAM: CHEST  2 VIEW COMPARISON:  08/24/2014 FINDINGS: Postsurgical changes from CABG is stable. Cardiomediastinal silhouette is mildly enlarged. Mediastinal contours appear intact. There is no evidence of focal airspace consolidation, pleural effusion or pneumothorax. Mild pulmonary vascular congestion. Osseous structures are without acute abnormality. Soft tissues are grossly normal. IMPRESSION: Mildly enlarged cardiac silhouette. Mild pulmonary vascular congestion. Electronically Signed   By: Fidela Salisbury M.D.   On: 10/19/2015 17:29  Ct Angio Chest Pe W And/or Wo Contrast  Result Date: 10/19/2015 CLINICAL DATA:  Shortness of Breath with productive cough EXAM: CT ANGIOGRAPHY CHEST WITH CONTRAST TECHNIQUE: Multidetector CT imaging of the chest was performed using the standard protocol during bolus administration of intravenous contrast. Multiplanar CT image reconstructions and MIPs were obtained to evaluate the vascular anatomy. CONTRAST:  80 mL Isovue 370. COMPARISON:  None. FINDINGS: Mediastinum/Lymph Nodes: The thoracic inlet is within normal limits. No significant hilar or mediastinal adenopathy is noted. Cardiovascular: Pulmonary artery demonstrates a normal branching pattern. No findings to suggest pulmonary emboli are seen. Aortic calcifications are noted without aneurysmal dilatation or dissection. Changes consistent with coronary bypass grafting are noted. Multiple coronary stents are noted within the bypass  grafts. Native  coronary calcifications are seen. No right heart strain is noted. No pericardial effusion is seen. Lungs/Pleura: Bilateral pleural effusions right greater than left are seen. Bilateral dependent atelectatic changes are noted. No significant pulmonary edema is noted. Upper abdomen: Visualized upper abdomen is within normal limits. Musculoskeletal: Degenerative changes of the thoracic spine are noted. Review of the MIP images confirms the above findings. IMPRESSION: No evidence of pulmonary emboli. Findings of prior coronary bypass grafting with stenting. No acute abnormality noted. Electronically Signed   By: Inez Catalina M.D.   On: 10/19/2015 21:41   Procedures Procedures (including critical care time)  Medications Ordered in ED Medications  iopamidol (ISOVUE-370) 76 % injection (100 mLs  Contrast Given 10/19/15 2119)  HYDROcodone-acetaminophen (NORCO/VICODIN) 5-325 MG per tablet 1 tablet (1 tablet Oral Given 10/19/15 2336)  furosemide (LASIX) injection 40 mg (40 mg Intravenous Given 10/19/15 2336)     Initial Impression / Assessment and Plan / ED Course  I have reviewed the triage vital signs and the nursing notes.  Pertinent labs & imaging results that were available during my care of the patient were reviewed by me and considered in my medical decision making (see chart for details).  Clinical Course    Patient is a 75 year old gentleman with extensive past medical history including CAD, COPD, CHF, PE on Coumadin who presents via EMS for evaluation treatment shortness of breath, hypoxia and 88% on room air. Patient was seen in his vascular surgery clinic today and refer to the ED.  Patient states the symptoms are present for roughly 1 month, gradually worsening. He present to the nursing home. Patient versus productive cough of green sputum. Patient denies any chest pain but does endorse some shortness of breath worse with lying flat.   Upon arrival patient was sinus tachycardia at 106, EKG  similar to prior. He is satting 97% on 2 L of oxygen. Patient has rales on lung exam, no wheezing. Patient with diffuse lower extremity edema.  Differential diagnosis includes pneumonia versus CHF exacerbation for CAD. Patient on Coumadin, doubt extension of pulmonary embolism.  Plan to check chest x-ray, BMP, CBC, troponin 2, BNP, blood cultures.  We'll reevaluate with totality of labs to determine need for antibiotics.  White blood cell count mildly elevated at 13.8. INR subtherapeutic at 1.39. Chest x-ray with mild pulmonary congestion. Will obtain Hemoccult blood test, CT PE and reevaluate patient.   CT PE study without acute findings. Patient reevaluated and was desaturating to 85-86% with a good waveform on room air.  Labs consistent with elevated BNP, chest x-ray with mild pulmonary congestion.  We'll admit to hospitalist due to new oxygen requirement, CHF exacerbation. Ordered 40 mg IV Lasix.  Patient admitted with no further emergency department events. Updated patient and his wife on plan of care.  Discussed case with my attending, Dr. Vanita Panda.    Final Clinical Impressions(s) / ED Diagnoses   Final diagnoses:  Dyspnea  Acute on chronic congestive heart failure, unspecified congestive heart failure type [facility])    New Prescriptions New Prescriptions   No medications on file     Vira Blanco, MD 10/19/15 [place], MD 10/21/15 1622

## 2015-10-19 NOTE — ED Notes (Signed)
Admitting physician at the bedside.

## 2015-10-19 NOTE — ED Triage Notes (Signed)
Per EMS: Pt from vascular MD, called for SOB. Pt complaining of SOB x 1 month. States coughing up green sputum. Pt 88% on RA. Given 2L O2, 97%. Pt denies any chest pain.

## 2015-10-19 NOTE — Progress Notes (Signed)
[facility] [place]   CC: Follow up peripheral artery occlusive disease  History of Present Illness [name] is a 75 y.o. male patient of Dr. Bridgett Larsson who presents with chief complaint: wounds in both legs. Onset of symptom occurred about April 2017;  associates his leg wounds with his wheelchair and trauma to his legs from the wheelchair. Pt denies any pain except with manipulating his wounds.   He is a resident of [facility] nursing facility. He is on anticoagulation and does not ambulate. Both legs have chronically been swollen for years. His memory is limited and he is unable to detail his prior operations. Patient has attempted to treat this pain with rest. The patient has no rest pain symptoms also. This patient has had known PE, he is on Xarelto. Atherosclerotic risk factors include: DM, CAD with hx of CABG x 2 separate surgeries, CHF, HTN, morbid obesity, chronic pain with use of analgesics, sedentary lifestyle with limited ability to walk, and prior smoking.  The wound care center is now managing and evaluating the wounds in his lower legs; he has unna boots on both lower legs. Pt refused to allow the ultrasonographer to removed the unna boot compression dressings in his lower legs in order to perform ABI's.   After review of his old office charts: 1. L pop artery aneurysm resection, L tb TE, L fem-pop bypass to tib BPG w/ cephalic vein with Dr. Justine Null (3/31/8) 2. L CEA w/ Dr. Scot Dock (12/13/97) 3. L EIA to pop vein bypass by Dr. Truman Hayward (06/15/81) 4. 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   Dr. Bridgett Larsson last saw pt on 08/03/15. At that time pt presented with: BLE CVI (C6), known LLE PAD likely moderate to severe This patient appears to have multiple active high grade medical problems.  I doubt he would be a candidate for any further surgical  interventions.  I would focus on wound care in this patient primarily. Would start with dry dressing to both calf wounds and apply ACE bandages.  I have ordered BLE ABI and BLE venous reflux duplexes to determine severity of his arterial and venous disease. His arterial disease will likely be under diagnosed with the ABI given his known DM.     Pt Diabetic: Yes Pt smoker: former smoker, quit in 1992   Pt meds include: Statin :Yes Betablocker: Yes ASA: No Other anticoagulants/antiplatelets: Xarelto   Past Medical History:  Diagnosis Date  . Adhesive capsulitis   . Angina   . Anxiety   . CAD (coronary artery disease)   . CHF (congestive heart failure) ([place])   . Chronic back pain   . Chronic kidney disease    hx of BPH  . COPD (chronic obstructive pulmonary disease) ([place])   . CVA (cerebral infarction) Questionable history  . Depression   . Diabetes mellitus   . Diverticulosis   . Falls frequently 06/2014  . GERD (gastroesophageal reflux disease)   . Hyperlipidemia   . Hypertension   . 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 Myocardial infarction ([place])   . Neuromuscular disorder ([place])    HX of diabetic periferal neuropathy  . On home oxygen therapy    "1.5L prn" (04/12/2014)  . Osteomyelitis ([place]) 2012   Sternoclavicular joint   . PE (pulmonary embolism) 10-15 years ago   Lifelong Coumadin  .  Peripheral vascular disease ([place])   . Shortness of breath   . Sleep apnea     Social History Social History  Substance Use Topics  . Smoking status: Former Smoker    Quit date: 06/19/2000  . Smokeless tobacco: Never Used  . Alcohol use No    Family History Family History  Problem Relation Age of Onset  . Heart disease Father   . Heart disease Mother     Past Surgical History:  Procedure Laterality Date  . AMPUTATION  03/16/2011   Procedure: AMPUTATION DIGIT;  Surgeon: Angelia Mould, MD;  Location: [facility] OR;   Service: Vascular;  Laterality: Left;  Great toe  . CARDIAC CATHETERIZATION  October 18, 2010   Known obstructive disease, no further blockages  . CARDIAC CATHETERIZATION  03/17/2005   EF 35-40%  . COLONOSCOPY  2006    Multiple polyps removed, repeat in 5 years  . CORONARY ARTERY BYPASS GRAFT  1992  . [place], 2002  . DOPPLER ECHOCARDIOGRAPHY  Sept 2011   EF 50-55% with some impaired diastolic relaxation  . FEMORAL-POPLITEAL BYPASS GRAFT  2008   Left  . FRACTURE SURGERY  1980s   Hip  . HIP ARTHROPLASTY Left   . Right iliopopliteal bypass - [place]  . septic arthritis     Removal of infected CABG wire by Dr Arlyce Dice - 2011  . SPINE SURGERY  2002   Cervical fusion vertebroplasty C3-4-5  . US ECHOCARDIOGRAPHY  03/04/2006   EF 50-55%    Allergies  Allergen Reactions  . Vancomycin Other (See Comments)    "red man syndrome"  . Diazepam Anxiety    REACTION: makes patient cry    Current Outpatient Prescriptions  Medication Sig Dispense Refill  . bisoprolol (ZEBETA) 5 MG tablet Take 0.5 tablets (2.5 mg total) by mouth daily. 30 tablet 0  . collagenase (SANTYL) ointment Apply 1 application topically daily.    Marland Kitchen docusate sodium (COLACE) 100 MG capsule Take 100 mg by mouth 2 (two) times daily.    . DULoxetine (CYMBALTA) 30 MG capsule Take 90 mg by mouth daily.    Marland Kitchen gabapentin (NEURONTIN) 400 MG capsule Take 400 mg by mouth 3 (three) times daily.    Marland Kitchen HYDROcodone-acetaminophen (NORCO) 7.5-325 MG tablet Take 1 tablet by mouth every 6 (six) hours as needed for moderate pain.    Marland Kitchen insulin lispro (HUMALOG KWIKPEN) 100 UNIT/ML KiwkPen Inject 8 Units into the skin 3 (three) times daily before meals. An additional if CBG is 0-149=0 units, 150-400= 5 units before meals and at bedtime    . insulin NPH Human (HUMULIN N,NOVOLIN N) 100 UNIT/ML injection Inject 0.22 mLs (22 Units total) into the skin 2 (two) times daily before a meal. 10 mL 11  . ipratropium-albuterol (DUONEB)  0.5-2.5 (3) MG/3ML SOLN Take 3 mLs by nebulization every 6 (six) hours as needed.    . lidocaine (LIDODERM) 5 % Apply to shoulder topically two times daily for pain. Apply in the am and remove in pm. Remove & Discard patch within 12 hours or as directed by MD    . lisinopril (PRINIVIL,ZESTRIL) 2.5 MG tablet Take 2.5 mg by mouth daily.     Marland Kitchen LORazepam (ATIVAN) 0.5 MG tablet Take 0.5 tablets (0.25 mg total) by mouth at bedtime. 30 tablet 1  . lubiprostone (AMITIZA) 8 MCG capsule Take 8 mcg by mouth 2 (two) times daily with a meal.    . meclizine (ANTIVERT) 25 MG tablet  Take 1 tablet (25 mg total) by mouth 3 (three) times daily as needed for dizziness. 45 tablet 1  . methocarbamol (ROBAXIN) 500 MG tablet Take 500 mg by mouth every 6 (six) hours as needed for muscle spasms.    . Multiple Vitamins-Minerals (DECUBI-VITE PO) Take by mouth daily.    . naloxegol oxalate (MOVANTIK) 25 MG TABS tablet Take 25 mg by mouth daily.    . naproxen sodium (ANAPROX) 220 MG tablet Take 440 mg by mouth 2 (two) times daily with a meal.    . nitroGLYCERIN (NITROSTAT) 0.4 MG SL tablet Place 1 tablet (0.4 mg total) under the tongue as needed. For chest pains 30 tablet 6  . omeprazole (PRILOSEC) 40 MG capsule Take 1 capsule (40 mg total) by mouth daily. 30 capsule 3  . Propylene Glycol (SYSTANE BALANCE) 0.6 % SOLN Apply 1 drop to eye 2 (two) times daily. Both eyes    . rivaroxaban (XARELTO) 20 MG TABS tablet Take 20 mg by mouth daily with supper.    . simvastatin (ZOCOR) 80 MG tablet Take 1 tablet (80 mg total) by mouth daily. 30 tablet 6  . Skin Protectants, Misc. (CALAZIME SKIN PROTECTANT EX) [place] to sacrum and right buttock topically every day shift for excoriation    . tamsulosin (FLOMAX) 0.4 MG CAPS capsule Take 1 capsule (0.4 mg total) by mouth daily after supper. 90 capsule 1  . torsemide (DEMADEX) 20 MG tablet Take 20 mg by mouth daily.    . traZODone (DESYREL) 100 MG tablet Take 100 mg by mouth at bedtime.     .  Vitamin D, Ergocalciferol, (DRISDOL) 50000 units CAPS capsule Take 1 capsule (50,000 Units total) by mouth every 7 (seven) days. 30 capsule prn   No current facility-administered medications for this visit.     ROS: See HPI for pertinent positives and negatives.   Physical Examination  Vitals:   10/19/15 1354 10/19/15 1355  BP: (!) 105/57 (!) 126/58  Pulse: 90 (!) 101  Resp: (!) 24   Temp: 98.7 F (37.1 C)   TempSrc: Oral   SpO2: 92%   Weight: 282 lb (127.9 kg)   Height: 5\' 8"  (1.727 m)    Body mass index is 42.88 kg/m.  General: A&O x 3, morbidly obese male, seated in wheelchair  Head: [place]/AT  Eyes: PERRLA  Neck: Supple,no palpable LAD, soft mass at right posterior neck, appearance of lipoma  Pulmonary: Sym exp, respirations are labored at rest, limited air movt,  + rales in all posterior fields, no rhonchi, + expiratory wheezing  Cardiac: RRR, Nl S1, S2, no detected murmurs, rubs or gallops  Vascular: Vessel Right Left  Radial faintly Palpable Faintly Palpable  Ulnar Not Palpable Not Palpable  Brachial  Palpable  Palpable  Carotid Palpable, with bruit Palpable, with bruit  Aorta Not palpable due to obesity N/A  Femoral Not Palpable due to pannus overlying groin Not Palpable due to pannus overlying groin  Popliteal Not palpable Not palpable  PT Unna boot in place [employer] boot in place  DP Unna boot in place [place] boot in place   Gastrointestinal: soft, NTND, -G/R, - HSM, - palpable masses, large panus, - CVAT B  Musculoskeletal: M/S 4/5 BUE, limited PROM testing in both legs due to discomfort with manipulation , unna boots in place both lower legs, Both feet cool, ulcers or gangrene in toes. Both hands and forearms with 1+ pitting and non pitting edema  Neurologic: CN 2-12 intact , Pain and  light touch intact in extremities except both feet which are anesthetic , Motor exam as listed above  Psychiatric: Judgment intact,  Mood & affect appropriate for pt's clinical situation, limited memory  Dermatologic: See M/S exam for extremity exam, no rashes otherwise noted    Non-Invasive Vascular Imaging: DATE: 10/19/2015  CEREBROVASCULAR DUPLEX EVALUATION    INDICATION: Carotid endarterectomy     PREVIOUS INTERVENTION(S): Left carotid endarterectomy in 1999    DUPLEX EXAM:     RIGHT  LEFT  Peak Systolic Velocities (cm/s) End Diastolic Velocities (cm/s) Plaque LOCATION Peak Systolic Velocities (cm/s) End Diastolic Velocities (cm/s) Plaque  91 13 HT CCA PROXIMAL 83 19 HT, CP  89 19 HT CCA MID 86 19 HT, CP  118 31 HT CCA DISTAL 68 17 HT  92 0  ECA 58 9   106 28 HT, CP ICA PROXIMAL 47 11 HT, CP  85 23 HT ICA MID 90 26   86 23  ICA DISTAL 63 26      ICA / CCA Ratio (PSV)   Antegrade Vertebral Flow Antegrade   Brachial Systolic Pressure (mmHg)   Triphasic) Subclavian Artery Waveforms Triphasic    Plaque Morphology:  HM = Homogeneous, HT = Heterogeneous, CP = Calcific Plaque, SP = Smooth Plaque, IP = Irregular Plaque     ADDITIONAL FINDINGS: Study performed with the patient sitting upright in a wheelchair.    IMPRESSION: 1. Patent left carotid endarterectomy site with no left internal carotid artery stenosis. 2. Doppler velocities suggest a 1-39% stenosis of the right proximal internal carotid artery.    Compared to the previous exam:  No previous exam.     ASSESSMENT: [name] is a 75 y.o. male who is s/p 1. L pop artery aneurysm resection, L tb TE, L fem-pop bypass to tib BPG w/ cephalic vein with Dr. Justine Null (3/31/8) 2. L CEA w/ Dr. Scot Dock (12/13/97) 3. L EIA to pop vein bypass by Dr. Truman Hayward (06/15/81) 4. 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  He has multiple high grade medical problems.  At his May 2017 visit the venous duplex showed no evidence of DVT. He was referred to [facility] wound care center to manage both lower legs venous stasis ulcers.   He returns today for  evaluation of venous stasis ulcers, repeat ABI's, and carotid duplex. The wound care center is now managing and evaluating the wounds in his lower legs; he has unna boots on both lower legs. Pt refused to allow the ultrasonographer to removed the unna boot compression dressings in his lower legs in order to perform ABI's.   Carotid duplex today indicates left carotid endarterectomy site with no left internal carotid artery stenosis and 1-39% stenosis of the right proximal internal carotid artery.  He is in moderate respiratory distress, SaO2 on RA is 92%, almost no air movement in all posterior fields with rales in upper posterior fields, + expiratory wheezes.  I spoke with Dr. Gildardo Cranker, [facility], who evaluated pt last month. Dr. Eulas Post said that pt decompensates quickly, and he should be evaluated at [facility] ED.  Will request medical transport.  I spoke with the triage nurse at [facility] ED. Pt transported via medical transport to [facility] ED.   Face to face time with patient was 25 minutes. Over 50% of this time was spent on counseling and coordination of care.     PLAN:  Dr. Eulas Post said that pt decompensates quickly, and he should be evaluated at [facility]  ED.  Since he drove himself, he should not drive; will request medical transport.  Based on the patient's vascular studies and examination, pt will return to clinic in 1 year with ABI's and carotid duplex.   I discussed in depth with the patient the nature of atherosclerosis, and emphasized the importance of maximal medical management including strict control of blood pressure, blood glucose, and lipid levels, obtaining regular exercise, and continued cessation of smoking.  The patient is aware that without maximal medical management the underlying atherosclerotic disease process will progress, limiting the benefit of any interventions.  The patient was given information about PAD including signs, symptoms, treatment, what symptoms should  prompt the patient to seek immediate medical care, and risk reduction measures to take.  Clemon Chambers, RN, MSN, FNP-C [facility] [employer] Phone: [phone]  Clinic MD: Bridgett Larsson  10/19/15 2:04 PM

## 2015-10-19 NOTE — Patient Instructions (Signed)
Peripheral Vascular Disease  Peripheral vascular disease (PVD) is a disease of the blood vessels that are not part of your heart and brain. A simple term for PVD is poor circulation. In most cases, PVD narrows the blood vessels that carry blood from your heart to the rest of your body. This can result in a decreased supply of blood to your arms, legs, and internal organs, like your stomach or kidneys. However, it most often affects a person's lower legs and feet.  There are two types of PVD.  · Organic PVD. This is the more common type. It is caused by damage to the structure of blood vessels.  · Functional PVD. This is caused by conditions that make blood vessels contract and tighten (spasm).  Without treatment, PVD tends to get worse over time.  PVD can also lead to acute ischemic limb. This is when an arm or limb suddenly has trouble getting enough blood. This is a medical emergency.  CAUSES  Each type of PVD has many different causes. The most common cause of PVD is buildup of a fatty material (plaque) inside of your arteries (atherosclerosis). Small amounts of plaque can break off from the walls of the blood vessels and become lodged in a smaller artery. This blocks blood flow and can cause acute ischemic limb.  Other common causes of PVD include:  · Blood clots that form inside of blood vessels.  · Injuries to blood vessels.  · Diseases that cause inflammation of blood vessels or cause blood vessel spasms.  · Health behaviors and health history that increase your risk of developing PVD.  RISK FACTORS   You may have a greater risk of PVD if you:  · Have a family history of PVD.  · Have certain medical conditions, including:    High cholesterol.    Diabetes.    High blood pressure (hypertension).    Coronary heart disease.    Past problems with blood clots.    Past injury, such as burns or a broken bone. These may have damaged blood vessels in your limbs.    Buerger disease. This is caused by inflamed blood  vessels in your hands and feet.    Some forms of arthritis.    Rare birth defects that affect the arteries in your legs.  · Use tobacco.  · Do not get enough exercise.  · Are obese.  · Are age 50 or older.  SIGNS AND SYMPTOMS   PVD may cause many different symptoms. Your symptoms depend on what part of your body is not getting enough blood. Some common signs and symptoms include:  · Cramps in your lower legs. This may be a symptom of poor leg circulation (claudication).  · Pain and weakness in your legs while you are physically active that goes away when you rest (intermittent claudication).  · Leg pain when at rest.  · Leg numbness, tingling, or weakness.  · Coldness in a leg or foot, especially when compared with the other leg.  · Skin or hair changes. These can include:    Hair loss.    Shiny skin.    Pale or bluish skin.    Thick toenails.  · Inability to get or maintain an erection (erectile dysfunction).  People with PVD are more prone to developing ulcers and sores on their toes, feet, or legs. These may take longer than normal to heal.  DIAGNOSIS  Your health care provider may diagnose PVD from your signs and symptoms.   The health care provider will also do a physical exam. You may have tests to find out what is causing your PVD and determine its severity. Tests may include:  · Blood pressure recordings from your arms and legs and measurements of the strength of your pulses (pulse volume recordings).  · Imaging studies using sound waves to take pictures of the blood flow through your blood vessels (Doppler ultrasound).  · Injecting a dye into your blood vessels before having imaging studies using:    X-rays (angiogram or arteriogram).    Computer-generated X-rays (CT angiogram).    A powerful electromagnetic field and a computer (magnetic resonance angiogram or MRA).  TREATMENT  Treatment for PVD depends on the cause of your condition and the severity of your symptoms. It also depends on your age. Underlying  causes need to be treated and controlled. These include long-lasting (chronic) conditions, such as diabetes, high cholesterol, and high blood pressure. You may need to first try making lifestyle changes and taking medicines. Surgery may be needed if these do not work.  Lifestyle changes may include:  · Quitting smoking.  · Exercising regularly.  · Following a low-fat, low-cholesterol diet.  Medicines may include:  · Blood thinners to prevent blood clots.  · Medicines to improve blood flow.  · Medicines to improve your blood cholesterol levels.  Surgical procedures may include:  · A procedure that uses an inflated balloon to open a blocked artery and improve blood flow (angioplasty).  · A procedure to put in a tube (stent) to keep a blocked artery open (stent implant).  · Surgery to reroute blood flow around a blocked artery (peripheral bypass surgery).  · Surgery to remove dead tissue from an infected wound on the affected limb.  · Amputation. This is surgical removal of the affected limb. This may be necessary in cases of acute ischemic limb that are not improved through medical or surgical treatments.  HOME CARE INSTRUCTIONS  · Take medicines only as directed by your health care provider.  · Do not use any tobacco products, including cigarettes, chewing tobacco, or electronic cigarettes.  If you need help quitting, ask your health care provider.  · Lose weight if you are overweight, and maintain a healthy weight as directed by your health care provider.  · Eat a diet that is low in fat and cholesterol. If you need help, ask your health care provider.  · Exercise regularly. Ask your health care provider to suggest some good activities for you.  · Use compression stockings or other mechanical devices as directed by your health care provider.  · Take good care of your feet.    Wear comfortable shoes that fit well.    Check your feet often for any cuts or sores.  SEEK MEDICAL CARE IF:  · You have cramps in your legs  while walking.  · You have leg pain when you are at rest.  · You have coldness in a leg or foot.  · Your skin changes.  · You have erectile dysfunction.  · You have cuts or sores on your feet that are not healing.  SEEK IMMEDIATE MEDICAL CARE IF:  · Your arm or leg turns cold and blue.  · Your arms or legs become red, warm, swollen, painful, or numb.  · You have chest pain or trouble breathing.  · You suddenly have weakness in your face, arm, or leg.  · You become very confused or lose the ability to speak.  ·   You suddenly have a very bad headache or lose your vision.     This information is not intended to replace advice given to you by your health care provider. Make sure you discuss any questions you have with your health care provider.     Document Released: 04/17/2004 Document Revised: 03/31/2014 Document Reviewed: 08/18/2013  [employer] Interactive Patient Education ©2016 [employer]    Venous Stasis or Chronic Venous Insufficiency  Chronic venous insufficiency, also called venous stasis, is a condition that affects the veins in the legs. The condition prevents blood from being pumped through these veins effectively. Blood may no longer be pumped effectively from the legs back to the heart. This condition can range from mild to severe. With proper treatment, you should be able to continue with an active life.  CAUSES   Chronic venous insufficiency occurs when the vein walls become stretched, weakened, or damaged or when valves within the vein are damaged. Some common causes of this include:  · High blood pressure inside the veins (venous hypertension).  · Increased blood pressure in the leg veins from long periods of sitting or standing.  · A blood clot that blocks blood flow in a vein (deep vein thrombosis).  · Inflammation of a superficial vein (phlebitis) that causes a blood clot to form.  RISK FACTORS  Various things can make you more likely to develop chronic venous insufficiency, including:  · Family  history of this condition.  · Obesity.  · Pregnancy.  · Sedentary lifestyle.  · Smoking.  · Jobs requiring long periods of standing or sitting in one place.  · Being a certain age. Women in their 40s and 50s and men in their 70s are more likely to develop this condition.  SIGNS AND SYMPTOMS   Symptoms may include:   · Varicose veins.  · Skin breakdown or ulcers.  · Reddened or discolored skin on the leg.  · Brown, smooth, tight, and painful skin just above the ankle, usually on the inside surface (lipodermatosclerosis).  · Swelling.  DIAGNOSIS   To diagnose this condition, your health care provider will take a medical history and do a physical exam. The following tests may be ordered to confirm the diagnosis:  · Duplex ultrasound--A procedure that produces a picture of a blood vessel and nearby organs and also provides information on blood flow through the blood vessel.  · Plethysmography--A procedure that tests blood flow.  · A venogram, or venography--A procedure used to look at the veins using X-ray and dye.  TREATMENT  The goals of treatment are to help you return to an active life and to minimize pain or disability. Treatment will depend on the severity of the condition. Medical procedures may be needed for severe cases. Treatment options may include:   · Use of compression stockings. These can help with symptoms and lower the chances of the problem getting worse, but they do not cure the problem.  · Sclerotherapy--A procedure involving an injection of a material that "dissolves" the damaged veins. Other veins in the network of blood vessels take over the function of the damaged veins.  · Surgery to remove the vein or cut off blood flow through the vein (vein stripping or laser ablation surgery).  · Surgery to repair a valve.  HOME CARE INSTRUCTIONS   · Wear compression stockings as directed by your health care provider.  · Only take over-the-counter or prescription medicines for pain, discomfort, or fever as  directed by your health   care provider.  · Follow up with your health care provider as directed.  SEEK MEDICAL CARE IF:   · You have redness, swelling, or increasing pain in the affected area.  · You see a red streak or line that extends up or down from the affected area.  · You have a breakdown or loss of skin in the affected area, even if the breakdown is small.  · You have an injury to the affected area.  SEEK IMMEDIATE MEDICAL CARE IF:   · You have an injury and open wound in the affected area.  · Your pain is severe and does not improve with medicine.  · You have sudden numbness or weakness in the foot or ankle below the affected area, or you have trouble moving your foot or ankle.  · You have a fever or persistent symptoms for more than 2-3 days.  · You have a fever and your symptoms suddenly get worse.  MAKE SURE YOU:   · Understand these instructions.  · Will watch your condition.  · Will get help right away if you are not doing well or get worse.     This information is not intended to replace advice given to you by your health care provider. Make sure you discuss any questions you have with your health care provider.     Document Released: 07/14/2006 Document Revised: 12/29/2012 Document Reviewed: 11/15/2012  [employer] Interactive Patient Education ©2016 [employer]

## 2015-10-19 NOTE — H&P (Signed)
History and Physical    [name] [id number] DOB: 08-26-1940 DOA: 10/19/2015  Referring MD/NP/PA:   PCP: [name], MD   Patient coming from:  The patient is coming from SNF  At baseline, pt is dependent for most of ADL.   Chief Complaint: SOB and cough  HPI: [name] is a 75 y.o. male with medical history significant of sCHF with EF 40-45%, hypertension, PVD, diabetes mellitus, hyperlipidemia, COPD, GERD, depression, anxiety, CAD, s/p of CABG, PE on Xarelto, stroke, BPH, who presents with shortness of breath.   Patient reports that he has been having shortness of breath for nearly a month, which has been progressively getting worse. He also has cough with greenish colored sputum production. He has mild intermittent chest pain. It is pleuritic and is aggravated by coughing. No fever or chills. Patient denies nausea, vomiting, abdominal pain, diarrhea. He states that he has mild dysuria sometimes, but no burning or increased urinary frequency. Patient does not have unilateral weakness, rashes. He denies any rectal bleeding, dark stool or melena.   ED Course: pt was found to have BNP 446, negative troponin, INR 1.39, WBC 13.8, hemoglobin dropped from 12.3-->8.7, electrolytes and renal function okay, pending urinalysis, pending FOBT, chest x-ray showed mild vascular congestion. CT angiogram is negative for PE. Patient is admitted to telemetry bed as inpatient for further eval and treatment.  Review of Systems:   General: no fevers, chills, no changes in body weight, has poor appetite, has fatigue HEENT: no blurry vision, hearing changes or sore throat Pulm: has dyspnea, coughing, wheezing CV: has chest pain, no palpitations Abd: no nausea, vomiting, abdominal pain, diarrhea, constipation GU: has dysuria, no burning on urination, increased urinary frequency, hematuria  Ext: has leg edema. Neuro: no unilateral weakness, numbness, or tingling, no vision change or hearing  loss Skin: no rash. Has chronic wound in both legs. MSK: No muscle spasm, no deformity, no limitation of range of movement in spin Heme: No easy bruising.  Travel history: No recent long distant travel.  Allergy:  Allergies  Allergen Reactions  . Vancomycin Other (See Comments)    "red man syndrome"  . Diazepam Anxiety    REACTION: makes patient cry    Past Medical History:  Diagnosis Date  . Adhesive capsulitis   . Angina   . Anxiety   . CAD (coronary artery disease)   . CHF (congestive heart failure) ([place])   . Chronic back pain   . Chronic kidney disease    hx of BPH  . COPD (chronic obstructive pulmonary disease) ([place])   . CVA (cerebral infarction) Questionable history  . Depression   . Diabetes mellitus   . Diverticulosis   . Falls frequently 06/2014  . GERD (gastroesophageal reflux disease)   . Hyperlipidemia   . Hypertension   . 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 Myocardial infarction ([place])   . Neuromuscular disorder ([place])    HX of diabetic periferal neuropathy  . On home oxygen therapy    "1.5L prn" (04/12/2014)  . Osteomyelitis ([place]) 2012   Sternoclavicular joint   . PE (pulmonary embolism) 10-15 years ago   Lifelong Coumadin  . Peripheral vascular disease ([place])   . Shortness of breath   . Sleep apnea     Past Surgical History:  Procedure Laterality Date  . AMPUTATION  03/16/2011   Procedure: AMPUTATION DIGIT;  Surgeon: Angelia Mould, MD;  Location: [facility];  Service: Vascular;  Laterality: Left;  Great toe  . CARDIAC CATHETERIZATION  October 18, 2010   Known obstructive disease, no further blockages  . CARDIAC CATHETERIZATION  03/17/2005   EF 35-40%  . COLONOSCOPY  2006    Multiple polyps removed, repeat in 5 years  . CORONARY ARTERY BYPASS GRAFT  1992  . [place], 2002  . DOPPLER ECHOCARDIOGRAPHY  Sept 2011   EF 50-55% with some impaired diastolic relaxation  .  FEMORAL-POPLITEAL BYPASS GRAFT  2008   Left  . FRACTURE SURGERY  1980s   Hip  . HIP ARTHROPLASTY Left   . Right iliopopliteal bypass - [place]  . septic arthritis     Removal of infected CABG wire by Dr Arlyce Dice - 2011  . SPINE SURGERY  2002   Cervical fusion vertebroplasty C3-4-5  . US ECHOCARDIOGRAPHY  03/04/2006   EF 50-55%    Social History:  reports that he quit smoking about 15 years ago. He has never used smokeless tobacco. He reports that he does not drink alcohol or use drugs.  Family History:  Family History  Problem Relation Age of Onset  . Heart disease Father   . Heart disease Mother      Prior to Admission medications   Medication Sig Start Date End Date Taking? Authorizing Provider  bisoprolol (ZEBETA) 5 MG tablet Take 0.5 tablets (2.5 mg total) by mouth daily. 05/10/14   Alveda Reasons, MD  collagenase (SANTYL) ointment Apply 1 application topically daily.    Historical Provider, MD  docusate sodium (COLACE) 100 MG capsule Take 100 mg by mouth 2 (two) times daily.    Historical Provider, MD  DULoxetine (CYMBALTA) 30 MG capsule Take 90 mg by mouth daily.    Historical Provider, MD  gabapentin (NEURONTIN) 400 MG capsule Take 400 mg by mouth 3 (three) times daily.    Historical Provider, MD  HYDROcodone-acetaminophen (NORCO) 7.5-325 MG tablet Take 1 tablet by mouth every 6 (six) hours as needed for moderate pain.    Historical Provider, MD  insulin lispro (HUMALOG KWIKPEN) 100 UNIT/ML KiwkPen Inject 8 Units into the skin 3 (three) times daily before meals. An additional if CBG is 0-149=0 units, 150-400= 5 units before meals and at bedtime    Historical Provider, MD  insulin NPH Human (HUMULIN N,NOVOLIN N) 100 UNIT/ML injection Inject 0.22 mLs (22 Units total) into the skin 2 (two) times daily before a meal. 02/15/15   Gerlene Fee, NP  ipratropium-albuterol (DUONEB) 0.5-2.5 (3) MG/3ML SOLN Take 3 mLs by nebulization every 6 (six) hours as needed.    Historical  Provider, MD  lidocaine (LIDODERM) 5 % Apply to shoulder topically two times daily for pain. Apply in the am and remove in pm. Remove & Discard patch within 12 hours or as directed by MD    Historical Provider, MD  lisinopril (PRINIVIL,ZESTRIL) 2.5 MG tablet Take 2.5 mg by mouth daily.  06/06/14   Historical Provider, MD  LORazepam (ATIVAN) 0.5 MG tablet Take 0.5 tablets (0.25 mg total) by mouth at bedtime. 07/06/14   Alveda Reasons, MD  lubiprostone (AMITIZA) 8 MCG capsule Take 8 mcg by mouth 2 (two) times daily with a meal.    Historical Provider, MD  meclizine (ANTIVERT) 25 MG tablet Take 1 tablet (25 mg total) by mouth 3 (three) times daily as needed for dizziness. 08/29/14   Alveda Reasons, MD  methocarbamol (ROBAXIN) 500 MG tablet Take 500 mg by mouth  every 6 (six) hours as needed for muscle spasms.    Historical Provider, MD  Multiple Vitamins-Minerals (DECUBI-VITE PO) Take by mouth daily.    Historical Provider, MD  naloxegol oxalate (MOVANTIK) 25 MG TABS tablet Take 25 mg by mouth daily.    Historical Provider, MD  naproxen sodium (ANAPROX) 220 MG tablet Take 440 mg by mouth 2 (two) times daily with a meal.    Historical Provider, MD  nitroGLYCERIN (NITROSTAT) 0.4 MG SL tablet Place 1 tablet (0.4 mg total) under the tongue as needed. For chest pains 10/19/13   Alveda Reasons, MD  omeprazole (PRILOSEC) 40 MG capsule Take 1 capsule (40 mg total) by mouth daily. 05/10/14   Alveda Reasons, MD  Propylene Glycol (SYSTANE BALANCE) 0.6 % SOLN Apply 1 drop to eye 2 (two) times daily. Both eyes    Historical Provider, MD  rivaroxaban (XARELTO) 20 MG TABS tablet Take 20 mg by mouth daily with supper.    Historical Provider, MD  simvastatin (ZOCOR) 80 MG tablet Take 1 tablet (80 mg total) by mouth daily. 06/06/14   Alveda Reasons, MD  Skin Protectants, Misc. (CALAZIME SKIN PROTECTANT EX) Apple to sacrum and right buttock topically every day shift for excoriation    Historical Provider, MD  tamsulosin  (FLOMAX) 0.4 MG CAPS capsule Take 1 capsule (0.4 mg total) by mouth daily after supper. 10/19/13   Alveda Reasons, MD  torsemide (DEMADEX) 20 MG tablet Take 20 mg by mouth daily.    Historical Provider, MD  traZODone (DESYREL) 100 MG tablet Take 100 mg by mouth at bedtime.     Historical Provider, MD  Vitamin D, Ergocalciferol, (DRISDOL) 50000 units CAPS capsule Take 1 capsule (50,000 Units total) by mouth every 7 (seven) days. 05/03/15   Gerlene Fee, NP    Physical Exam: Vitals:   10/19/15 2300 10/19/15 2315 10/19/15 2330 10/20/15 0128  BP: 107/60 110/59 101/60 (!) 95/57  Pulse: 96 94 92 (!) 101  Resp: 22 25 20  (!) 21  Temp:    98.2 F (36.8 C)  TempSrc:    Oral  SpO2: (!) 86% (!) 87% 99% 96%  Weight:    131.1 kg (289 lb)   General: Not in acute distress HEENT:       Eyes: PERRL, EOMI, no scleral icterus.       ENT: No discharge from the ears and nose, no pharynx injection, no tonsillar enlargement.        Neck: positive JVD, no bruit, no mass felt. Heme: No neck lymph node enlargement. Cardiac: S1/S2, RRR, No murmurs, No gallops or rubs. Pulm: has wheezing bilaterally. No rales or rubs. Abd: Soft, nondistended, nontender, no rebound pain, no organomegaly, BS present. GU: No hematuria Ext: 2+ pitting leg edema bilaterally. DP/PT pulse present bilaterally. Musculoskeletal: No joint deformities, No joint redness or warmth, no limitation of ROM in spin. Skin: No rashes. Has chronic wound in both legs. Neuro: Alert, oriented X3, cranial nerves II-XII grossly intact, moves all extremities normally. Psych: Patient is not psychotic, no suicidal or hemocidal ideation.  Labs on Admission: I have personally reviewed following labs and imaging studies  CBC:  Recent Labs Lab 10/19/15 1833 10/20/15 0119  WBC 13.8* 13.4*  NEUTROABS 11.2*  --   HGB 8.7* 8.0*  HCT 31.0* 29.3*  MCV 70.9* 71.1*  PLT 336 [id number]   Basic Metabolic Panel:  Recent Labs Lab 10/19/15 1833  NA 134*  K  4.8  CL 98*  CO2 29  GLUCOSE 123*  BUN 15  CREATININE 0.93  CALCIUM 8.4*   GFR: Estimated Creatinine Clearance: 89.4 mL/min (by C-G formula based on SCr of 0.93 mg/dL). Liver Function Tests: No results for input(s): AST, ALT, ALKPHOS, BILITOT, PROT, ALBUMIN in the last 168 hours. No results for input(s): LIPASE, AMYLASE in the last 168 hours. No results for input(s): AMMONIA in the last 168 hours. Coagulation Profile:  Recent Labs Lab 10/19/15 1833  INR 1.39   Cardiac Enzymes: No results for input(s): CKTOTAL, CKMB, CKMBINDEX, TROPONINI in the last 168 hours. BNP (last 3 results) No results for input(s): PROBNP in the last 8760 hours. HbA1C: No results for input(s): HGBA1C in the last 72 hours. CBG: No results for input(s): GLUCAP in the last 168 hours. Lipid Profile: No results for input(s): CHOL, HDL, LDLCALC, TRIG, CHOLHDL, LDLDIRECT in the last 72 hours. Thyroid Function Tests: No results for input(s): TSH, T4TOTAL, FREET4, T3FREE, THYROIDAB in the last 72 hours. Anemia Panel: No results for input(s): VITAMINB12, FOLATE, FERRITIN, TIBC, IRON, RETICCTPCT in the last 72 hours. Urine analysis:    Component Value Date/Time   COLORURINE YELLOW 09/13/2014 0945   APPEARANCEUR CLEAR 09/13/2014 0945   LABSPEC 1.023 09/13/2014 0945   PHURINE 6.0 09/13/2014 0945   GLUCOSEU 250 (A) 09/13/2014 0945   HGBUR NEGATIVE 09/13/2014 0945   HGBUR negative 03/06/2010 1419   BILIRUBINUR NEGATIVE 09/13/2014 0945   KETONESUR NEGATIVE 09/13/2014 0945   PROTEINUR NEGATIVE 09/13/2014 0945   UROBILINOGEN 0.2 09/13/2014 0945   NITRITE NEGATIVE 09/13/2014 0945   LEUKOCYTESUR NEGATIVE 09/13/2014 0945   Sepsis Labs: @LABRCNTIP (procalcitonin:4,lacticidven:4) )No results found for this or any previous visit (from the past 240 hour(s)).   Radiological Exams on Admission: Dg Chest 2 View  Result Date: 10/19/2015 CLINICAL DATA:  Productive cough. EXAM: CHEST  2 VIEW COMPARISON:  08/24/2014  FINDINGS: Postsurgical changes from CABG is stable. Cardiomediastinal silhouette is mildly enlarged. Mediastinal contours appear intact. There is no evidence of focal airspace consolidation, pleural effusion or pneumothorax. Mild pulmonary vascular congestion. Osseous structures are without acute abnormality. Soft tissues are grossly normal. IMPRESSION: Mildly enlarged cardiac silhouette. Mild pulmonary vascular congestion. Electronically Signed   By: Fidela Salisbury M.D.   On: 10/19/2015 17:29  Ct Angio Chest Pe W And/or Wo Contrast  Result Date: 10/19/2015 CLINICAL DATA:  Shortness of Breath with productive cough EXAM: CT ANGIOGRAPHY CHEST WITH CONTRAST TECHNIQUE: Multidetector CT imaging of the chest was performed using the standard protocol during bolus administration of intravenous contrast. Multiplanar CT image reconstructions and MIPs were obtained to evaluate the vascular anatomy. CONTRAST:  80 mL Isovue 370. COMPARISON:  None. FINDINGS: Mediastinum/Lymph Nodes: The thoracic inlet is within normal limits. No significant hilar or mediastinal adenopathy is noted. Cardiovascular: Pulmonary artery demonstrates a normal branching pattern. No findings to suggest pulmonary emboli are seen. Aortic calcifications are noted without aneurysmal dilatation or dissection. Changes consistent with coronary bypass grafting are noted. Multiple coronary stents are noted within the bypass grafts. Native coronary calcifications are seen. No right heart strain is noted. No pericardial effusion is seen. Lungs/Pleura: Bilateral pleural effusions right greater than left are seen. Bilateral dependent atelectatic changes are noted. No significant pulmonary edema is noted. Upper abdomen: Visualized upper abdomen is within normal limits. Musculoskeletal: Degenerative changes of the thoracic spine are noted. Review of the MIP images confirms the above findings. IMPRESSION: No evidence of pulmonary emboli. Findings of prior  coronary bypass grafting with stenting. No acute abnormality noted. Electronically Signed  By: Inez Catalina M.D.   On: 10/19/2015 21:41    EKG: Independently reviewed. Sinus rhythm, QTC 522, old left bundle blockage  Assessment/Plan Principal Problem:   Acute on chronic respiratory failure with hypoxia (HCC) Active Problems:   HYPERCHOLESTEROLEMIA   Anxiety state   Depression   Essential hypertension   Coronary atherosclerosis   Long term current use of anticoagulant therapy   BPH (benign prostatic hyperplasia)   COPD exacerbation (HCC)   Peripheral vascular disease (HCC)   GERD (gastroesophageal reflux disease)   Type II diabetes mellitus with neurological manifestations (HCC)   Chronic pulmonary embolism (HCC)   Acute on chronic systolic (congestive) heart failure (HCC)   Microcytic anemia   Acute on chronic respiratory failure with hypoxia ([place]): Most likely due to combination of COPD and CHF exacerbation. - will admit to tele bed as in pt. -Treat CHF and COPD exacerbation as below  Acute on chronic systolic (congestive) heart failure ([place]): 2-D echo 07/19/14 showed EF 40-45 percent. Patient has elevated BNP 446, bilateral leg edema and positive JVD, consistent with CHF exacerbation. -Lasix 40 mg tid by IV -trop x 3 -2d echo -will continue home Zebeta, lisinopril  -Daily weights -strict I/O's -Low salt diet  COPD exacerbation: Patient has a productive cough, and bilateral wheezing on auscultation, consistent with a COPD exacerbation. -Nebulizers: scheduled Duoneb and prn albuterol nebs -Solu-Medrol 60 mg IV bid -Oral azithromycin for 5 days.  -Mucinex for cough  -Follow up blood culture x2, sputum culture, respiratory virus panel  Microcytic anemia: Hgb dropped from 12.3 on 07/26/15 to 8.0. He did not notice rectal bleeding. He is on Xarelto, he could have mild GI bleeding.  INR 1.39. -will check FOBT -Hold Xarelto until FOBT negative -check anemia panel -if FOBT is  positive, please call GI.  Hx of EP: pt is on Xarelto currently. CTA of chest is negative for PE today. -Hold Xarelto due to possible GI bleeding -Observe respiratory function closely  HLD: Last LDL was 58 on 10/19/10 -Lipitor  Depression and anxiety: Stable, no suicidal or homicidal ideations. -Continue home medications: symbalta, Ativan  GERD: -Protonix  HTN: bp soft -continue Zebeta and lisinopril (put holding parameter)  BPH: stable - Continue Flomax  DM-II: Last A1c 7.4,  not well controled. Patient is NPH-humulin taking at home -will decrease NPH humulin dose from 22 to 15 units bid. -SSI  PAD and chronic leg wound: pt has been followed with [facility]. He was seen on 10/18/16 by NP, Barnabas Lister. Per clinic note, wound care center is now managing and evaluating the wounds in his lower legs; he has unna boots on both lower legs. Pt refused to allow the ultrasonographer to removed the unna boot compression dressings in his lower legs in order to perform ABI's. Per Dr. Bridgett Larsson, Pt is not a candidate for any further surgical interventions, need to focus on wound care in this patient primarily.  Per Vinnie Level Nickle's note, pt has following hx of PAD: 1. L pop artery aneurysm resection, L tb TE, L fem-pop bypass to tib BPG w/ cephalic vein with Dr. Justine Null (3/31/8) 2. L CEA w/ Dr. Scot Dock (12/13/97) 3. L EIA to pop vein bypass by Dr. Truman [place] (06/15/81) 4. 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  DVT ppx: pt has possible GI bleeding, cannot use heparin or Lovenox for DVT prophylaxis, patient has chronic wound in both legs, cannot use SCD Code Status: DNR  Family Communication: None at bed side.  Disposition Plan:  Anticipate discharge back to previous SNF environment Consults called: None Admission status: Inpatient/tele   Date of Service 10/20/2015    Ivor Costa [facility] Pager [phone]  If  7PM-7AM, please contact night-coverage [email or web address] Password [email or web address] 10/20/2015, 3:33 AM

## 2015-10-20 ENCOUNTER — Encounter (HOSPITAL_COMMUNITY): Payer: Self-pay

## 2015-10-20 DIAGNOSIS — E7800 Pure hypercholesterolemia, unspecified: Secondary | ICD-10-CM

## 2015-10-20 DIAGNOSIS — I509 Heart failure, unspecified: Secondary | ICD-10-CM | POA: Insufficient documentation

## 2015-10-20 DIAGNOSIS — I2782 Chronic pulmonary embolism: Secondary | ICD-10-CM

## 2015-10-20 LAB — RESPIRATORY PANEL BY PCR
Adenovirus: NOT DETECTED
Bordetella pertussis: NOT DETECTED
CORONAVIRUS OC43-RVPPCR: NOT DETECTED
Chlamydophila pneumoniae: NOT DETECTED
Coronavirus 229E: NOT DETECTED
Coronavirus HKU1: NOT DETECTED
Coronavirus NL63: NOT DETECTED
INFLUENZA A H1 2009-RVPPR: NOT DETECTED
INFLUENZA A H1-RVPPCR: NOT DETECTED
Influenza A H3: NOT DETECTED
Influenza A: NOT DETECTED
Influenza B: NOT DETECTED
METAPNEUMOVIRUS-RVPPCR: NOT DETECTED
MYCOPLASMA PNEUMONIAE-RVPPCR: NOT DETECTED
PARAINFLUENZA VIRUS 1-RVPPCR: NOT DETECTED
PARAINFLUENZA VIRUS 2-RVPPCR: NOT DETECTED
Parainfluenza Virus 3: NOT DETECTED
Parainfluenza Virus 4: NOT DETECTED
RESPIRATORY SYNCYTIAL VIRUS-RVPPCR: NOT DETECTED
Rhinovirus / Enterovirus: NOT DETECTED

## 2015-10-20 LAB — URINALYSIS, ROUTINE W REFLEX MICROSCOPIC
Bilirubin Urine: NEGATIVE
Glucose, UA: NEGATIVE mg/dL
Hgb urine dipstick: NEGATIVE
Ketones, ur: NEGATIVE mg/dL
Leukocytes, UA: NEGATIVE
Nitrite: NEGATIVE
Protein, ur: NEGATIVE mg/dL
Specific Gravity, Urine: 1.018 (ref 1.005–1.030)
pH: 6 (ref 5.0–8.0)

## 2015-10-20 LAB — CBC
HCT: 29.3 % — ABNORMAL LOW (ref 39.0–52.0)
HCT: 31.5 % — ABNORMAL LOW (ref 39.0–52.0)
HEMATOCRIT: 31.8 % — AB (ref 39.0–52.0)
HEMATOCRIT: 29.2 % — AB (ref 39.0–52.0)
HEMOGLOBIN: 8.9 g/dL — AB (ref 13.0–17.0)
HEMOGLOBIN: 9 g/dL — AB (ref 13.0–17.0)
HEMOGLOBIN: 8.4 g/dL — AB (ref 13.0–17.0)
Hemoglobin: 8 g/dL — ABNORMAL LOW (ref 13.0–17.0)
MCH: 19.4 pg — ABNORMAL LOW (ref 26.0–34.0)
MCH: 19.8 pg — ABNORMAL LOW (ref 26.0–34.0)
MCH: 19.8 pg — ABNORMAL LOW (ref 26.0–34.0)
MCH: 20.2 pg — AB (ref 26.0–34.0)
MCHC: 27.3 g/dL — ABNORMAL LOW (ref 30.0–36.0)
MCHC: 28.3 g/dL — ABNORMAL LOW (ref 30.0–36.0)
MCHC: 28.3 g/dL — ABNORMAL LOW (ref 30.0–36.0)
MCHC: 28.8 g/dL — AB (ref 30.0–36.0)
MCV: 71.1 fL — ABNORMAL LOW (ref 78.0–100.0)
MCV: 70 fL — ABNORMAL LOW (ref 78.0–100.0)
MCV: 70 fL — AB (ref 78.0–100.0)
MCV: 70.2 fL — ABNORMAL LOW (ref 78.0–100.0)
PLATELETS: 301 10*3/uL (ref 150–400)
PLATELETS: 321 10*3/uL (ref 150–400)
Platelets: 305 10*3/uL (ref 150–400)
Platelets: 311 10*3/uL (ref 150–400)
RBC: 4.12 MIL/uL — ABNORMAL LOW (ref 4.22–5.81)
RBC: 4.5 MIL/uL (ref 4.22–5.81)
RBC: 4.54 MIL/uL (ref 4.22–5.81)
RBC: 4.16 MIL/uL — ABNORMAL LOW (ref 4.22–5.81)
RDW: 17.9 % — AB (ref 11.5–15.5)
RDW: 18 % — ABNORMAL HIGH (ref 11.5–15.5)
RDW: 17.8 % — ABNORMAL HIGH (ref 11.5–15.5)
RDW: 18 % — ABNORMAL HIGH (ref 11.5–15.5)
WBC: 13.4 10*3/uL — AB (ref 4.0–10.5)
WBC: 12.6 10*3/uL — AB (ref 4.0–10.5)
WBC: 13 10*3/uL — AB (ref 4.0–10.5)
WBC: 14.8 10*3/uL — ABNORMAL HIGH (ref 4.0–10.5)

## 2015-10-20 LAB — GLUCOSE, CAPILLARY
GLUCOSE-CAPILLARY: 175 mg/dL — AB (ref 65–99)
GLUCOSE-CAPILLARY: 202 mg/dL — AB (ref 65–99)
Glucose-Capillary: 213 mg/dL — ABNORMAL HIGH (ref 65–99)
Glucose-Capillary: 225 mg/dL — ABNORMAL HIGH (ref 65–99)

## 2015-10-20 LAB — OCCULT BLOOD X 1 CARD TO LAB, STOOL: Fecal Occult Bld: NEGATIVE

## 2015-10-20 LAB — TYPE AND SCREEN
ABO/RH(D): A POS
Antibody Screen: NEGATIVE

## 2015-10-20 LAB — MRSA PCR SCREENING: MRSA by PCR: NEGATIVE

## 2015-10-20 MED ORDER — ENSURE ENLIVE PO LIQD
237.0000 mL | ORAL | Status: DC
  Administered 2015-10-20 – 2015-10-24 (×4): 237 mL via ORAL

## 2015-10-20 MED ORDER — RIVAROXABAN 20 MG PO TABS: 20.0000 mg | ORAL_TABLET | Freq: Every day | ORAL | Status: DC

## 2015-10-20 MED ORDER — DOCUSATE SODIUM 100 MG PO CAPS
100.0000 mg | ORAL_CAPSULE | Freq: Two times a day (BID) | ORAL | Status: DC
  Administered 2015-10-20 – 2015-10-29 (×19): 100 mg via ORAL
  Filled 2015-10-20 (×20): qty 1

## 2015-10-20 MED ORDER — NITROGLYCERIN 0.4 MG SL SUBL: 0.4000 mg | SUBLINGUAL_TABLET | SUBLINGUAL | Status: DC | PRN

## 2015-10-20 MED ORDER — PANTOPRAZOLE SODIUM 40 MG PO TBEC
40.0000 mg | DELAYED_RELEASE_TABLET | Freq: Every day | ORAL | Status: DC
  Administered 2015-10-20 – 2015-10-29 (×10): 40 mg via ORAL
  Filled 2015-10-20 (×10): qty 1

## 2015-10-20 MED ORDER — ATORVASTATIN CALCIUM 40 MG PO TABS
40.0000 mg | ORAL_TABLET | Freq: Every day | ORAL | Status: DC
  Administered 2015-10-20 – 2015-10-28 (×9): 40 mg via ORAL
  Filled 2015-10-20 (×7): qty 1

## 2015-10-20 MED ORDER — LUBIPROSTONE 8 MCG PO CAPS
8.0000 ug | ORAL_CAPSULE | Freq: Two times a day (BID) | ORAL | Status: DC
  Administered 2015-10-20 – 2015-10-29 (×19): 8 ug via ORAL
  Filled 2015-10-20 (×20): qty 1

## 2015-10-20 MED ORDER — METHYLPREDNISOLONE SODIUM SUCC 125 MG IJ SOLR
60.0000 mg | Freq: Two times a day (BID) | INTRAMUSCULAR | Status: DC
  Administered 2015-10-20 – 2015-10-23 (×9): 60 mg via INTRAVENOUS
  Filled 2015-10-20 (×9): qty 2

## 2015-10-20 MED ORDER — BISOPROLOL FUMARATE 5 MG PO TABS
2.5000 mg | ORAL_TABLET | Freq: Every day | ORAL | Status: DC
  Administered 2015-10-20 – 2015-10-23 (×4): 2.5 mg via ORAL
  Filled 2015-10-20 (×3): qty 1

## 2015-10-20 MED ORDER — LORAZEPAM 0.5 MG PO TABS
0.2500 mg | ORAL_TABLET | Freq: Every day | ORAL | Status: DC
  Administered 2015-10-20 – 2015-10-28 (×10): 0.25 mg via ORAL
  Filled 2015-10-20 (×10): qty 1

## 2015-10-20 MED ORDER — VITAMIN D (ERGOCALCIFEROL) 1.25 MG (50000 UNIT) PO CAPS
50000.0000 [IU] | ORAL_CAPSULE | ORAL | Status: DC
  Administered 2015-10-24: 50000 [IU] via ORAL
  Filled 2015-10-20: qty 1

## 2015-10-20 MED ORDER — AZITHROMYCIN 500 MG PO TABS
500.0000 mg | ORAL_TABLET | Freq: Once | ORAL | Status: AC
  Administered 2015-10-20: 500 mg via ORAL
  Filled 2015-10-20: qty 1

## 2015-10-20 MED ORDER — IPRATROPIUM-ALBUTEROL 0.5-2.5 (3) MG/3ML IN SOLN
3.0000 mL | Freq: Four times a day (QID) | RESPIRATORY_TRACT | Status: DC
  Administered 2015-10-20 – 2015-10-21 (×5): 3 mL via RESPIRATORY_TRACT
  Filled 2015-10-20 (×5): qty 3

## 2015-10-20 MED ORDER — COLLAGENASE 250 UNIT/GM EX OINT
1.0000 "application " | TOPICAL_OINTMENT | Freq: Every day | CUTANEOUS | Status: DC
  Administered 2015-10-22 – 2015-10-23 (×2): 1 via TOPICAL
  Filled 2015-10-20: qty 30

## 2015-10-20 MED ORDER — MECLIZINE HCL 25 MG PO TABS: 25.0000 mg | ORAL_TABLET | Freq: Three times a day (TID) | ORAL | Status: DC | PRN

## 2015-10-20 MED ORDER — INSULIN ASPART 100 UNIT/ML ~~LOC~~ SOLN: 0.0000 [IU] | Freq: Three times a day (TID) | SUBCUTANEOUS | Status: DC

## 2015-10-20 MED ORDER — IPRATROPIUM-ALBUTEROL 0.5-2.5 (3) MG/3ML IN SOLN
3.0000 mL | RESPIRATORY_TRACT | Status: DC
  Administered 2015-10-20: 3 mL via RESPIRATORY_TRACT
  Filled 2015-10-20: qty 3

## 2015-10-20 MED ORDER — TRAZODONE HCL 100 MG PO TABS
100.0000 mg | ORAL_TABLET | Freq: Every day | ORAL | Status: DC
  Administered 2015-10-20 – 2015-10-28 (×10): 100 mg via ORAL
  Filled 2015-10-20 (×10): qty 1

## 2015-10-20 MED ORDER — NALOXEGOL OXALATE 25 MG PO TABS
25.0000 mg | ORAL_TABLET | Freq: Every day | ORAL | Status: DC
  Administered 2015-10-20 – 2015-10-28 (×9): 25 mg via ORAL
  Filled 2015-10-20 (×10): qty 1

## 2015-10-20 MED ORDER — METHOCARBAMOL 500 MG PO TABS
500.0000 mg | ORAL_TABLET | Freq: Four times a day (QID) | ORAL | Status: DC | PRN
  Administered 2015-10-25 – 2015-10-29 (×2): 500 mg via ORAL
  Filled 2015-10-20 (×2): qty 1

## 2015-10-20 MED ORDER — TAMSULOSIN HCL 0.4 MG PO CAPS
0.4000 mg | ORAL_CAPSULE | Freq: Every day | ORAL | Status: DC
  Administered 2015-10-20 – 2015-10-28 (×9): 0.4 mg via ORAL
  Filled 2015-10-20 (×8): qty 1

## 2015-10-20 MED ORDER — ALBUTEROL SULFATE (2.5 MG/3ML) 0.083% IN NEBU: 2.5000 mg | INHALATION_SOLUTION | RESPIRATORY_TRACT | Status: DC | PRN

## 2015-10-20 MED ORDER — INSULIN ASPART 100 UNIT/ML ~~LOC~~ SOLN
0.0000 [IU] | Freq: Three times a day (TID) | SUBCUTANEOUS | Status: DC
  Administered 2015-10-20: 3 [IU] via SUBCUTANEOUS
  Administered 2015-10-20: 2 [IU] via SUBCUTANEOUS
  Administered 2015-10-20 – 2015-10-21 (×4): 3 [IU] via SUBCUTANEOUS
  Administered 2015-10-22: 5 [IU] via SUBCUTANEOUS
  Administered 2015-10-22 (×2): 3 [IU] via SUBCUTANEOUS
  Administered 2015-10-23: 2 [IU] via SUBCUTANEOUS
  Administered 2015-10-23 (×2): 5 [IU] via SUBCUTANEOUS
  Administered 2015-10-24: 3 [IU] via SUBCUTANEOUS
  Administered 2015-10-24 (×3): 5 [IU] via SUBCUTANEOUS
  Administered 2015-10-25 – 2015-10-27 (×6): 2 [IU] via SUBCUTANEOUS
  Administered 2015-10-27: 3 [IU] via SUBCUTANEOUS
  Administered 2015-10-28: 5 [IU] via SUBCUTANEOUS
  Administered 2015-10-28 (×2): 2 [IU] via SUBCUTANEOUS
  Administered 2015-10-29: 5 [IU] via SUBCUTANEOUS
  Administered 2015-10-29: 2 [IU] via SUBCUTANEOUS

## 2015-10-20 MED ORDER — SODIUM CHLORIDE 0.9% FLUSH: 3.0000 mL | INTRAVENOUS | Status: DC | PRN

## 2015-10-20 MED ORDER — INSULIN NPH (HUMAN) (ISOPHANE) 100 UNIT/ML ~~LOC~~ SUSP
15.0000 [IU] | Freq: Two times a day (BID) | SUBCUTANEOUS | Status: DC
  Administered 2015-10-20 – 2015-10-23 (×7): 15 [IU] via SUBCUTANEOUS
  Filled 2015-10-20 (×2): qty 10

## 2015-10-20 MED ORDER — GABAPENTIN 400 MG PO CAPS
400.0000 mg | ORAL_CAPSULE | Freq: Three times a day (TID) | ORAL | Status: DC
  Administered 2015-10-20 – 2015-10-29 (×28): 400 mg via ORAL
  Filled 2015-10-20 (×23): qty 1
  Filled 2015-10-20: qty 4
  Filled 2015-10-20 (×4): qty 1

## 2015-10-20 MED ORDER — SODIUM CHLORIDE 0.9 % IV SOLN: 250.0000 mL | INTRAVENOUS | Status: DC | PRN

## 2015-10-20 MED ORDER — PRO-STAT SUGAR FREE PO LIQD
30.0000 mL | Freq: Two times a day (BID) | ORAL | Status: DC
  Administered 2015-10-20 – 2015-10-25 (×10): 30 mL via ORAL
  Filled 2015-10-20 (×10): qty 30

## 2015-10-20 MED ORDER — HYDROCODONE-ACETAMINOPHEN 7.5-325 MG PO TABS
1.0000 | ORAL_TABLET | Freq: Four times a day (QID) | ORAL | Status: DC | PRN
  Administered 2015-10-20 – 2015-10-29 (×13): 1 via ORAL
  Filled 2015-10-20 (×15): qty 1

## 2015-10-20 MED ORDER — ZOLPIDEM TARTRATE 5 MG PO TABS
5.0000 mg | ORAL_TABLET | Freq: Every evening | ORAL | Status: DC | PRN
  Administered 2015-10-25: 5 mg via ORAL
  Filled 2015-10-20: qty 1

## 2015-10-20 MED ORDER — ONDANSETRON HCL 4 MG/2ML IJ SOLN: 4.0000 mg | Freq: Four times a day (QID) | INTRAMUSCULAR | Status: DC | PRN

## 2015-10-20 MED ORDER — NAPROXEN 250 MG PO TABS
500.0000 mg | ORAL_TABLET | Freq: Two times a day (BID) | ORAL | Status: DC
  Administered 2015-10-20 – 2015-10-25 (×11): 500 mg via ORAL
  Filled 2015-10-20 (×13): qty 2

## 2015-10-20 MED ORDER — DM-GUAIFENESIN ER 30-600 MG PO TB12
1.0000 | ORAL_TABLET | Freq: Two times a day (BID) | ORAL | Status: DC
  Administered 2015-10-20 – 2015-10-28 (×18): 1 via ORAL
  Filled 2015-10-20 (×18): qty 1

## 2015-10-20 MED ORDER — POLYVINYL ALCOHOL 1.4 % OP SOLN
1.0000 [drp] | Freq: Two times a day (BID) | OPHTHALMIC | Status: DC
  Administered 2015-10-20 – 2015-10-28 (×17): 1 [drp] via OPHTHALMIC
  Filled 2015-10-20: qty 15

## 2015-10-20 MED ORDER — DULOXETINE HCL 60 MG PO CPEP
90.0000 mg | ORAL_CAPSULE | Freq: Every day | ORAL | Status: DC
  Administered 2015-10-20 – 2015-10-29 (×10): 90 mg via ORAL
  Filled 2015-10-20 (×10): qty 1

## 2015-10-20 MED ORDER — SODIUM CHLORIDE 0.9% FLUSH
3.0000 mL | Freq: Two times a day (BID) | INTRAVENOUS | Status: DC
  Administered 2015-10-20 – 2015-10-29 (×20): 3 mL via INTRAVENOUS

## 2015-10-20 MED ORDER — FUROSEMIDE 10 MG/ML IJ SOLN
40.0000 mg | Freq: Three times a day (TID) | INTRAMUSCULAR | Status: DC
  Administered 2015-10-20 – 2015-10-23 (×13): 40 mg via INTRAVENOUS
  Filled 2015-10-20 (×13): qty 4

## 2015-10-20 MED ORDER — PROSIGHT PO TABS
1.0000 | ORAL_TABLET | Freq: Every day | ORAL | Status: DC
  Administered 2015-10-20 – 2015-10-29 (×10): 1 via ORAL
  Filled 2015-10-20 (×10): qty 1

## 2015-10-20 MED ORDER — LIDOCAINE 5 % EX PTCH
1.0000 | MEDICATED_PATCH | CUTANEOUS | Status: DC
  Administered 2015-10-20 – 2015-10-29 (×10): 1 via TRANSDERMAL
  Filled 2015-10-20 (×10): qty 1

## 2015-10-20 MED ORDER — AZITHROMYCIN 250 MG PO TABS
250.0000 mg | ORAL_TABLET | Freq: Every day | ORAL | Status: AC
  Administered 2015-10-21 – 2015-10-24 (×4): 250 mg via ORAL
  Filled 2015-10-20 (×4): qty 1

## 2015-10-20 MED ORDER — ACETAMINOPHEN 325 MG PO TABS
650.0000 mg | ORAL_TABLET | ORAL | Status: DC | PRN
  Administered 2015-10-29: 650 mg via ORAL
  Filled 2015-10-20: qty 2

## 2015-10-20 MED ORDER — LISINOPRIL 2.5 MG PO TABS
2.5000 mg | ORAL_TABLET | Freq: Every day | ORAL | Status: DC
  Administered 2015-10-20 – 2015-10-29 (×10): 2.5 mg via ORAL
  Filled 2015-10-20 (×10): qty 1

## 2015-10-20 NOTE — Progress Notes (Signed)
PROGRESS NOTE    [name]  [id number] DOB: 06-23-1940 DOA: 10/19/2015 PCP: [name], MD   Brief Narrative:  HPI on 10/19/2015 by Dr. Leory Plowman is a 75 y.o. male with medical history significant of sCHF with EF 40-45%, hypertension, PVD, diabetes mellitus, hyperlipidemia, COPD, GERD, depression, anxiety, CAD, s/p of CABG, PE on Xarelto, stroke, BPH, who presents with shortness of breath.   Patient reports that he has been having shortness of breath for nearly a month, which has been progressively getting worse. He also has cough with greenish colored sputum production. He has mild intermittent chest pain. It is pleuritic and is aggravated by coughing. No fever or chills. Patient denies nausea, vomiting, abdominal pain, diarrhea. He states that he has mild dysuria sometimes, but no burning or increased urinary frequency. Patient does not have unilateral weakness, rashes. He denies any rectal bleeding, dark stool or melena.   Assessment & Plan   Acute on chronic respiratory failure with hypoxia -Most likely due to combination of COPD and CHF exacerbation. -Treatment and plan below  Acute on chronic systolic  Heart failure -Echocardiogram 07/19/14 showed EF 40-45% -BNP 446, +JVD, LE edema upon admission -CXR -Contiue IV lasix 40mg  TID -Monitor intake/output, daily weights -Echocardiogram pending  -Continue lisinopril and bisoprolol  COPD exacerbation  -Cough has been ongoing for 4 weeks per patient.  -Upon admission, patient had bilateral wheezing on auscultation -Nebulizers: scheduled Duoneb and prn albuterol nebs -Continue Solu-Medrol, azithromycin, mucinex -Blood and sputum cultures pending -Respiratory viral panel pending   Microcytic anemia -Basline hemoglobin 12-13 (waas 12.3 on 07/26/15) -Currently Hemoglobin 8.9 (was 8.7 upon admission) -Xarelto held -INR 1.39 -FOBT and anemia panel ordered  History of PE -paient was on xarelto -CTA chest  neg for PE   -Monitor respiratory status closely  Hyperlipidemia -Last LDL was 58 on 10/19/10 -Continue statin  Depression and anxiety -Continue Cymbalta and Ativan  GERD -Continue Protonix  Essential hypertension -Continue Zebeta and lisinopril (with holding parameters)  BPH -Continue Flomax  Diabetes Mellitus, type II -Last A1c 7.4 -Continue NPH, ISS, CBG monitoring  PAD and chronic leg wound -Patient has been followed with [facility]. He was seen on 10/18/16 by NP, Barnabas Lister. Per clinic note, wound care center is now managing and evaluating the wounds in his lower legs; he has unna boots on both lower legs. Pt refused to allow the ultrasonographer to removed the unna boot compression dressings in his lower legs in order to perform ABI's. Per Dr. Bridgett Larsson, Pt is not a candidate for any further surgical interventions, need to focus on wound care in this patient primarily. -Wound care consulted  DVT Prophylaxis  None (given possible GI bleed, no chemical DVT prophylaxis, LE wounds, no SCDs)  Code Status: DNR  Family Communication: None at bedside  Disposition Plan: Admitted.   Consultants None  Procedures  None  Antibiotics   Anti-infectives    Start     Dose/Rate Route Frequency Ordered Stop   10/21/15 1000  azithromycin (ZITHROMAX) tablet 250 mg     250 mg Oral Daily 10/20/15 0013 10/25/15 0959   10/20/15 0015  azithromycin (ZITHROMAX) tablet 500 mg     500 mg Oral  Once 10/20/15 0013 10/20/15 0202      Subjective:   [name] seen and examined today. Patient feels his breathing has improved since admission. Continues to have cough. Denies chest pain, abdominal pain, nausea/vomiting, diarrhea,constipation, dizziness, headache.    Objective:  Vitals:   10/20/15 0128 10/20/15 0625 10/20/15 0810 10/20/15 1124  BP: (!) 95/57 (!) 113/54  (!) 103/56  Pulse: (!) 101 96  95  Resp: (!) 21 20    Temp: 98.2 F (36.8 C)  98.3 F (36.8 C)    TempSrc: Oral Oral    SpO2: 96% 93% 95% 93%  Weight: 131.1 kg (289 lb)       Intake/Output Summary (Last 24 hours) at 10/20/15 1140 Last data filed at 10/20/15 0935  Gross per 24 hour  Intake              120 ml  Output              900 ml  Net             -780 ml   Filed Weights   10/20/15 0128  Weight: 131.1 kg (289 lb)    Exam  General: Well developed, well nourished, chronically ill, NAD  HEENT: NCAT, mucous membranes moist.   Neck: Supple, + JVD, no masses  Cardiovascular: S1 S2 auscultated, no rubs, murmurs or gallops. Regular rate and rhythm.  Respiratory: Diminished but clear, with equal chest rise  Abdomen: Soft, nontender, nondistended, + bowel sounds  Extremities: warm dry without cyanosis clubbing. LE edema  Neuro: AAOx3, nonfocal  Skin: UNNA boots in place on LE  Psych: Normal affect and demeanor with intact judgement and insight   Data Reviewed: I have personally reviewed following labs and imaging studies  CBC:  Recent Labs Lab 10/19/15 1833 10/20/15 0119 10/20/15 0601  WBC 13.8* 13.4* 12.6*  NEUTROABS 11.2*  --   --   HGB 8.7* 8.0* 8.9*  HCT 31.0* 29.3* 31.5*  MCV 70.9* 71.1* 70.0*  PLT [id number]   Basic Metabolic Panel:  Recent Labs Lab 10/19/15 1833  NA 134*  K 4.8  CL 98*  CO2 29  GLUCOSE 123*  BUN 15  CREATININE 0.93  CALCIUM 8.4*   GFR: Estimated Creatinine Clearance: 89.4 mL/min (by C-G formula based on SCr of 0.93 mg/dL). Liver Function Tests: No results for input(s): AST, ALT, ALKPHOS, BILITOT, PROT, ALBUMIN in the last 168 hours. No results for input(s): LIPASE, AMYLASE in the last 168 hours. No results for input(s): AMMONIA in the last 168 hours. Coagulation Profile:  Recent Labs Lab 10/19/15 1833  INR 1.39   Cardiac Enzymes: No results for input(s): CKTOTAL, CKMB, CKMBINDEX, TROPONINI in the last 168 hours. BNP (last 3 results) No results for input(s): PROBNP in the last 8760  hours. HbA1C: No results for input(s): HGBA1C in the last 72 hours. CBG:  Recent Labs Lab 10/20/15 0613  GLUCAP 175*   Lipid Profile: No results for input(s): CHOL, HDL, LDLCALC, TRIG, CHOLHDL, LDLDIRECT in the last 72 hours. Thyroid Function Tests: No results for input(s): TSH, T4TOTAL, FREET4, T3FREE, THYROIDAB in the last 72 hours. Anemia Panel: No results for input(s): VITAMINB12, FOLATE, FERRITIN, TIBC, IRON, RETICCTPCT in the last 72 hours. Urine analysis:    Component Value Date/Time   COLORURINE YELLOW 09/13/2014 0945   APPEARANCEUR CLEAR 09/13/2014 0945   LABSPEC 1.023 09/13/2014 0945   PHURINE 6.0 09/13/2014 0945   GLUCOSEU 250 (A) 09/13/2014 0945   HGBUR NEGATIVE 09/13/2014 0945   HGBUR negative 03/06/2010 1419   BILIRUBINUR NEGATIVE 09/13/2014 0945   KETONESUR NEGATIVE 09/13/2014 0945   PROTEINUR NEGATIVE 09/13/2014 0945   UROBILINOGEN 0.2 09/13/2014 0945   NITRITE NEGATIVE 09/13/2014 0945   LEUKOCYTESUR NEGATIVE 09/13/2014 0945  Sepsis Labs: @LABRCNTIP (procalcitonin:4,lacticidven:4)  ) Recent Results (from the past 240 hour(s))  MRSA PCR Screening     Status: None   Collection Time: 10/20/15  8:44 AM  Result Value Ref Range Status   MRSA by PCR NEGATIVE NEGATIVE Final    Comment:        The GeneXpert MRSA Assay (FDA approved for NASAL specimens only), is one component of a comprehensive MRSA colonization surveillance program. It is not intended to diagnose MRSA infection nor to guide or monitor treatment for MRSA infections.       Radiology Studies: Dg Chest 2 View  Result Date: 10/19/2015 CLINICAL DATA:  Productive cough. EXAM: CHEST  2 VIEW COMPARISON:  08/24/2014 FINDINGS: Postsurgical changes from CABG is stable. Cardiomediastinal silhouette is mildly enlarged. Mediastinal contours appear intact. There is no evidence of focal airspace consolidation, pleural effusion or pneumothorax. Mild pulmonary vascular congestion. Osseous structures are  without acute abnormality. Soft tissues are grossly normal. IMPRESSION: Mildly enlarged cardiac silhouette. Mild pulmonary vascular congestion. Electronically Signed   By: Fidela Salisbury M.D.   On: 10/19/2015 17:29  Ct Angio Chest Pe W And/or Wo Contrast  Result Date: 10/19/2015 CLINICAL DATA:  Shortness of Breath with productive cough EXAM: CT ANGIOGRAPHY CHEST WITH CONTRAST TECHNIQUE: Multidetector CT imaging of the chest was performed using the standard protocol during bolus administration of intravenous contrast. Multiplanar CT image reconstructions and MIPs were obtained to evaluate the vascular anatomy. CONTRAST:  80 mL Isovue 370. COMPARISON:  None. FINDINGS: Mediastinum/Lymph Nodes: The thoracic inlet is within normal limits. No significant hilar or mediastinal adenopathy is noted. Cardiovascular: Pulmonary artery demonstrates a normal branching pattern. No findings to suggest pulmonary emboli are seen. Aortic calcifications are noted without aneurysmal dilatation or dissection. Changes consistent with coronary bypass grafting are noted. Multiple coronary stents are noted within the bypass grafts. Native coronary calcifications are seen. No right heart strain is noted. No pericardial effusion is seen. Lungs/Pleura: Bilateral pleural effusions right greater than left are seen. Bilateral dependent atelectatic changes are noted. No significant pulmonary edema is noted. Upper abdomen: Visualized upper abdomen is within normal limits. Musculoskeletal: Degenerative changes of the thoracic spine are noted. Review of the MIP images confirms the above findings. IMPRESSION: No evidence of pulmonary emboli. Findings of prior coronary bypass grafting with stenting. No acute abnormality noted. Electronically Signed   By: Inez Catalina M.D.   On: 10/19/2015 21:41    Scheduled Meds: . atorvastatin  40 mg Oral q1800  . [START ON 10/21/2015] azithromycin  250 mg Oral Daily  . bisoprolol  2.5 mg Oral Daily  .  collagenase  1 application Topical Daily  . dextromethorphan-guaiFENesin  1 tablet Oral BID  . docusate sodium  100 mg Oral BID  . DULoxetine  90 mg Oral Daily  . furosemide  40 mg Intravenous TID  . gabapentin  400 mg Oral TID  . insulin aspart  0-9 Units Subcutaneous TID WC  . insulin NPH Human  15 Units Subcutaneous BID AC & HS  . ipratropium-albuterol  3 mL Nebulization QID  . lidocaine  1 patch Transdermal Q24H  . lisinopril  2.5 mg Oral Daily  . LORazepam  0.25 mg Oral QHS  . lubiprostone  8 mcg Oral BID WC  . methylPREDNISolone (SOLU-MEDROL) injection  60 mg Intravenous Q12H  . multivitamin  1 tablet Oral Daily  . naloxegol oxalate  25 mg Oral Daily  . naproxen  500 mg Oral BID WC  . pantoprazole  40  mg Oral Daily  . polyvinyl alcohol  1 drop Both Eyes BID  . sodium chloride flush  3 mL Intravenous Q12H  . tamsulosin  0.4 mg Oral QPC supper  . traZODone  100 mg Oral QHS  . [START ON 10/24/2015] Vitamin D (Ergocalciferol)  50,000 Units Oral Q Wed   Continuous Infusions:    LOS: 1 day   Time Spent in minutes   30 minutes  Deniro Laymon D.O. on 10/20/2015 at 11:40 AM  Between 7am to 7pm - Pager - [phone]  After 7pm go to [email or web address] - password [email or web address]  And look for the night coverage person covering for me after hours  [facility] Office  [phone]

## 2015-10-20 NOTE — Progress Notes (Signed)
Pt has multiple wounds to lower extremities, unna boots in place. Awaiting Wound care consult

## 2015-10-20 NOTE — Progress Notes (Signed)
Initial Nutrition Assessment  DOCUMENTATION CODES:  Morbid obesity  INTERVENTION:  Will order 30 mL Prostat BID, each supplement provides 100 kcal and 15 grams of protein.  Ensure Enlive q24 hrs, each supplement provides 350 kcal and 20 grams of protein  NUTRITION DIAGNOSIS:  Increased nutrient needs related to wound healing + Acute exacerbations of chronic illnessess as evidenced by estimated nutritional requirements for these conditions  GOAL:  Patient will meet greater than or equal to 90% of their needs  MONITOR:  PO intake, Supplement acceptance, Labs, Weight trends  REASON FOR ASSESSMENT:  Consult Assessment of nutrition requirement/status  ASSESSMENT:  75 y/o male PMHx CHF, HTN, PVD, DM,  HTN, HLD, COPD, GERD, Depression/ANXIETY, CAD s/p CABG, PE, CVA. Presents with SOB x1 month, intermittent chest pain, green sputum. Poor appetite and fatigue noted. Admitted for acute on chronic resp failure likely due to COPD/CHF exacerbations.   Pt reports that for roughly 2 months his appetite hasn't been as good. He is not sure why. He lives at a nursing facility and says he has been consuming less of his meals than usual. He did not know if he was on any type of therapeutic diet, though his wife comments that she believes he is on a low sodium diet. He says his BG levels are typically 130-150  Pt says his UBW is roughly 270 lbs. Per chart review, his weight is stable.   Denies any N/V/D. Does report constipation. He is on a bowel regimen at facility. He has colace ordered now  Pt said he didn't eat much of his breakfast. RD advised pt to consume a supplement as his PO intake is sub optimal. He agreed  Labs reviewed: CBGs: 123, 175. WBC:12.6 Meds: colace, lasix, insulin, methyprednisone, mvi, Vit D, amitizia    Recent Labs Lab 10/19/15 1833  NA 134*  K 4.8  CL 98*  CO2 29  BUN 15  CREATININE 0.93  CALCIUM 8.4*  GLUCOSE 123*    Diet Order:  Diet renal/carb modified with  fluid restriction Diet-HS Snack? Nothing; Room service appropriate? Yes; Fluid consistency: Thin; Fluid restriction: 1800 mL Fluid  Skin: 2x PUs, one to sacrum, other location not listed. Both were not staged. MD noted chronic leg wounds on both legs.   Last BM:  7/28  Height:  Ht Readings from Last 1 Encounters:  10/19/15 5\' 8"  (1.727 m)   Weight:  Wt Readings from Last 1 Encounters:  10/20/15 289 lb (131.1 kg)   Wt Readings from Last 10 Encounters:  10/20/15 289 lb (131.1 kg)  10/19/15 282 lb (127.9 kg)  09/20/15 275 lb 1.6 oz (124.8 kg)  09/13/15 274 lb 2 oz (124.3 kg)  08/21/15 266 lb 8 oz (120.9 kg)  08/03/15 270 lb (122.5 kg)  07/30/15 272 lb 6 oz (123.5 kg)  07/26/15 282 lb (127.9 kg)  07/23/15 282 lb 1 oz (127.9 kg)  07/12/15 282 lb (127.9 kg)  Admit weight: 282 lbs  Ideal Body Weight:  70 kg  BMI:  Body mass index is 43.94 kg/m.  Estimated Nutritional Needs:  Kcal:  1900-2100 (15-16 kcal/kg bw) Protein:  105-120 g (1.5-1.7 g/kg ibw) Fluid:  1.9-2.1 liters  EDUCATION NEEDS:  No education needs identified at this time  Burtis Junes RD, LDN, [facility] Pager: [phone] 10/20/2015 12:17 PM

## 2015-10-20 NOTE — Consult Note (Signed)
[place] Nurse wound consult note Reason for Consult:Bilateral LE wounds.  Chronic, non-healing. Patient followed by the outpatient wound care center (according to patient). May be receiving care at SNF [facility]).  Wife is not in room at this time although she has been here today. Seen by vascular (VVS) in their office by NP on Friday, 11/19/15. Patient cries out in pain whenever LEs or any other body part is touched, even when provided advance warning prior to lifting LEs for examination. 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  The majority of this ulcer's depth is still obscured by the presence of black eschar. Right heel: 2cm round x 0.2cm with lateral extension at 9 o'clock measuring 1cm x 2cm x 0.1cm Left anterior foot, medial aspect: 5cm x 4cm x 0.4cm.  60% red, 30% yellow, 10% dark purple Left LE at medial incision: 3cm x 2cm with wound bed 100% yellow slough, firmly adherent Wound bed: As described above. Drainage (amount, consistency, odor) Moderate amount of light yellow to yellow wound exudate from all wounds. Musty odor. Periwound:Intact, dry. Dressing procedure/placement/frequency: I am reluctant to have ortho tech replace the modified Unna's Boot (I removed a dry boot, not an [employer]) as I feel patient could benefit from more frequent wound care, specifically daily. Wound care orders are provided for daily care following saline cleanse using calcium alginate dressing Kellie Simmering [phone]) topped with gauze, followed by wrapping of LEs from metatarsal head to knee (heel inclusive). This will be topped with an ACE bandage wrapped in a similar manner.  I have asked for heels to be floated while in bed and chair.  I have provided a therapeutic sleep surface with low air loss feature as patient has had loose stools and exhibits moisture associated skin  damage in the intragluteal cleft (incontinence associated dermatitis). Turning side to side is still important and this is stressed to both Advice worker. Patient to resume care as overseen by VVS for these chronic ulcerations at the SNF at which he lives upon discharge. [place] nursing team will not follow, but will remain available to this patient, the nursing and medical teams.  Please re-consult if needed. Thanks, Maudie Flakes, MSN, RN, [place], Arther Abbott  Pager# [phone]

## 2015-10-20 NOTE — Evaluation (Signed)
Physical Therapy Evaluation Patient Details Name: OLUWAFEMI LOURY MRN: [id number] DOB: [birth year] Today's Date: 10/20/2015   History of Present Illness  ISHAAQ MUDGETT is a 75 y.o. male with medical history significant of sCHF, hypertension, PVD, diabetes mellitus, hyperlipidemia, COPD, GERD, depression, anxiety, CAD, s/p of CABG, PE on Xarelto, stroke, BPH, who presents with shortness of breath.  Admitted with Acute on chronic respiratory failure with hypoxia  Clinical Impression  Pt admitted with above diagnosis. Pt currently with functional limitations due to the deficits listed below (see PT Problem List). Per wife, patient has been making good progress with rehab at [facility] and is able to take several steps. Mr. Mccollin shows determination and was able to perform bed mobility and transfer tasks today with min-mod assist using a rolling walker for support. He will benefit from continued rehab at [facility] upon d/c. Pt will benefit from skilled PT to increase their independence and safety with mobility to allow discharge to the venue listed below.       Follow Up Recommendations SNF    Equipment Recommendations  None recommended by PT    Recommendations for Other Services       Precautions / Restrictions Precautions Precautions: Fall Required Braces or Orthoses: Other Brace/Splint Other Brace/Splint: PRAFO BIL      Mobility  Bed Mobility Overal bed mobility: Needs Assistance Bed Mobility: Rolling;Sidelying to Sit Rolling: Min assist Sidelying to sit: Mod assist;HOB elevated       General bed mobility comments: Min assist to roll onto Rt side with guidance and use of UEs on rail. Mod assist for truncal assist to rise EOB and use of bed pad to assist pt lightly to EOB.  Transfers Overall transfer level: Needs assistance Equipment used: Rolling walker (2 wheeled) Transfers: Sit to/from [facility] Sit to Stand: Mod assist Stand pivot transfers: Min assist        General transfer comment: Mod assist for boost to stand from bed and BSCx2. Cues for hand placement and anterior weight shift to rise. Min assist for balance with pivot to [facility] and recliner from bed. Mild instability upon standing with posterior lean at times.  Ambulation/Gait                Stairs            Wheelchair Mobility    Modified Rankin (Stroke Patients Only)       Balance Overall balance assessment: Needs assistance Sitting-balance support: Single extremity supported Sitting balance-[name]: Poor     Standing balance support: Bilateral upper extremity supported Standing balance-[name]: Poor                               Pertinent Vitals/Pain Pain Assessment: No/denies pain (Only when RLE moved.)    Home Living Family/patient expects to be discharged to:: Skilled nursing facility Living Arrangements: Other (Comment) (SNF) Available Help at Discharge: [place] Type of Home: [place] Equipment: Gilford Rile - 2 wheels Additional Comments: Pt is from SNF where he has resided for the past year.    Prior Function Level of Independence: Needs assistance   Gait / Transfers Assistance Needed: Wife reports he has been working with rehab at [facility] and is able to take several small steps at this time.  ADL's / Homemaking Assistance Needed: Dependent with ADLs.  Hand Dominance        Extremity/Trunk Assessment   Upper Extremity Assessment: Defer to OT evaluation           Lower Extremity Assessment: Generalized weakness (BIL Unna boots)         Communication   Communication: No difficulties  Cognition Arousal/Alertness: Awake/alert Behavior During Therapy: WFL for tasks assessed/performed Overall Cognitive Status: Within Functional Limits for tasks assessed                      General Comments General comments (skin integrity, edema, etc.): Pt had small bowel  movement on BSC after transfer.    Exercises General Exercises - Lower Extremity Ankle Circles/Pumps: AROM;Both;5 reps;Seated      Assessment/Plan    PT Assessment Patient needs continued PT services  PT Diagnosis Difficulty walking;Generalized weakness;Acute pain   PT Problem List Decreased strength;Decreased range of motion;Decreased activity tolerance;Decreased balance;Decreased mobility;Decreased knowledge of use of DME;Cardiopulmonary status limiting activity;Obesity;Pain  PT Treatment Interventions DME instruction;Gait training;Functional mobility training;Therapeutic activities;Therapeutic exercise;Balance training;Neuromuscular re-education;Patient/family education   PT Goals (Current goals can be found in the Care Plan section) Acute Rehab PT Goals Patient Stated Goal: Get stronger PT Goal Formulation: With patient/family Time For Goal Achievement: 11/03/15 Potential to Achieve Goals: Fair    Frequency Min 2X/week   Barriers to discharge Decreased caregiver support lives with wife, however plans to return to SNF    Co-evaluation               End of Session Equipment Utilized During Treatment: Gait belt;Oxygen Activity Tolerance: Patient limited by fatigue Patient left: in chair;with call bell/phone within reach;with chair alarm set;with family/visitor present;Other (comment) (respiratory in room) Nurse Communication: Mobility status         Time: [id number] PT Time Calculation (min) (ACUTE ONLY): 25 min   Charges:   PT Evaluation $PT Eval High Complexity: 1 Procedure PT Treatments $Therapeutic Activity: 8-22 mins   PT G Codes:        Ellouise Newer 10/20/2015, 1:35 PM  Elayne Snare, [place] (weekend pager)

## 2015-10-21 ENCOUNTER — Inpatient Hospital Stay (HOSPITAL_COMMUNITY): Payer: Medicare Other

## 2015-10-21 LAB — BLOOD CULTURE ID PANEL (REFLEXED)
ACINETOBACTER BAUMANNII: NOT DETECTED
CANDIDA KRUSEI: NOT DETECTED
CARBAPENEM RESISTANCE: NOT DETECTED
Candida albicans: NOT DETECTED
Candida glabrata: NOT DETECTED
Candida parapsilosis: NOT DETECTED
Candida tropicalis: NOT DETECTED
ENTEROBACTERIACEAE SPECIES: NOT DETECTED
Enterobacter cloacae complex: NOT DETECTED
Enterococcus species: NOT DETECTED
Escherichia coli: NOT DETECTED
Haemophilus influenzae: NOT DETECTED
KLEBSIELLA OXYTOCA: NOT DETECTED
Klebsiella pneumoniae: NOT DETECTED
Listeria monocytogenes: NOT DETECTED
Methicillin resistance: NOT DETECTED
NEISSERIA MENINGITIDIS: NOT DETECTED
PSEUDOMONAS AERUGINOSA: NOT DETECTED
Proteus species: NOT DETECTED
STAPHYLOCOCCUS AUREUS BCID: NOT DETECTED
STAPHYLOCOCCUS SPECIES: DETECTED — AB
STREPTOCOCCUS AGALACTIAE: NOT DETECTED
STREPTOCOCCUS SPECIES: NOT DETECTED
Serratia marcescens: NOT DETECTED
Streptococcus pneumoniae: NOT DETECTED
Streptococcus pyogenes: NOT DETECTED
Vancomycin resistance: NOT DETECTED

## 2015-10-21 LAB — CBC
HCT: 28.1 % — ABNORMAL LOW (ref 39.0–52.0)
HCT: 28.2 % — ABNORMAL LOW (ref 39.0–52.0)
HEMATOCRIT: 32.4 % — AB (ref 39.0–52.0)
HEMOGLOBIN: 8.2 g/dL — AB (ref 13.0–17.0)
HEMOGLOBIN: 9 g/dL — AB (ref 13.0–17.0)
Hemoglobin: 8.1 g/dL — ABNORMAL LOW (ref 13.0–17.0)
MCH: 19.7 pg — ABNORMAL LOW (ref 26.0–34.0)
MCH: 20 pg — ABNORMAL LOW (ref 26.0–34.0)
MCH: 20 pg — ABNORMAL LOW (ref 26.0–34.0)
MCHC: 27.8 g/dL — ABNORMAL LOW (ref 30.0–36.0)
MCHC: 28.8 g/dL — ABNORMAL LOW (ref 30.0–36.0)
MCHC: 29.1 g/dL — AB (ref 30.0–36.0)
MCV: 68.8 fL — ABNORMAL LOW (ref 78.0–100.0)
MCV: 69.4 fL — ABNORMAL LOW (ref 78.0–100.0)
MCV: 70.7 fL — AB (ref 78.0–100.0)
PLATELETS: 293 10*3/uL (ref 150–400)
PLATELETS: 298 10*3/uL (ref 150–400)
Platelets: 299 10*3/uL (ref 150–400)
RBC: 4.05 MIL/uL — AB (ref 4.22–5.81)
RBC: 4.1 MIL/uL — AB (ref 4.22–5.81)
RBC: 4.58 MIL/uL (ref 4.22–5.81)
RDW: 17.9 % — ABNORMAL HIGH (ref 11.5–15.5)
RDW: 18 % — AB (ref 11.5–15.5)
RDW: 18.1 % — ABNORMAL HIGH (ref 11.5–15.5)
WBC: 13.4 10*3/uL — AB (ref 4.0–10.5)
WBC: 13.7 10*3/uL — AB (ref 4.0–10.5)
WBC: 15.3 10*3/uL — AB (ref 4.0–10.5)

## 2015-10-21 LAB — GLUCOSE, CAPILLARY
GLUCOSE-CAPILLARY: 213 mg/dL — AB (ref 65–99)
GLUCOSE-CAPILLARY: 189 mg/dL — AB (ref 65–99)
Glucose-Capillary: 240 mg/dL — ABNORMAL HIGH (ref 65–99)
Glucose-Capillary: 201 mg/dL — ABNORMAL HIGH (ref 65–99)

## 2015-10-21 LAB — BASIC METABOLIC PANEL
ANION GAP: 10 (ref 5–15)
BUN: 27 mg/dL — ABNORMAL HIGH (ref 6–20)
CALCIUM: 8.3 mg/dL — AB (ref 8.9–10.3)
CO2: 26 mmol/L (ref 22–32)
Chloride: 98 mmol/L — ABNORMAL LOW (ref 101–111)
Creatinine, Ser: 1.1 mg/dL (ref 0.61–1.24)
Glucose, Bld: 213 mg/dL — ABNORMAL HIGH (ref 65–99)
Potassium: 4.9 mmol/L (ref 3.5–5.1)
Sodium: 134 mmol/L — ABNORMAL LOW (ref 135–145)

## 2015-10-21 LAB — FERRITIN: FERRITIN: 38 ng/mL (ref 24–336)

## 2015-10-21 LAB — RETICULOCYTES
RBC.: 4.05 MIL/uL — ABNORMAL LOW (ref 4.22–5.81)
RETIC COUNT ABSOLUTE: 81 10*3/uL (ref 19.0–186.0)
RETIC CT PCT: 2 % (ref 0.4–3.1)

## 2015-10-21 LAB — VITAMIN B12: VITAMIN B 12: 301 pg/mL (ref 180–914)

## 2015-10-21 LAB — IRON AND TIBC
IRON: 12 ug/dL — AB (ref 45–182)
Saturation Ratios: 3 % — ABNORMAL LOW (ref 17.9–39.5)
TIBC: 349 ug/dL (ref 250–450)
UIBC: 337 ug/dL

## 2015-10-21 LAB — FOLATE: Folate: 17.4 ng/mL (ref >5.9)

## 2015-10-21 MED ORDER — IPRATROPIUM-ALBUTEROL 0.5-2.5 (3) MG/3ML IN SOLN
3.0000 mL | Freq: Three times a day (TID) | RESPIRATORY_TRACT | Status: DC
  Administered 2015-10-21 – 2015-10-27 (×18): 3 mL via RESPIRATORY_TRACT
  Filled 2015-10-21 (×19): qty 3

## 2015-10-21 MED ORDER — CYANOCOBALAMIN 1000 MCG/ML IJ SOLN
1000.0000 ug | Freq: Once | INTRAMUSCULAR | Status: AC
  Administered 2015-10-21: 1000 ug via INTRAMUSCULAR
  Filled 2015-10-21: qty 1

## 2015-10-21 MED ORDER — VANCOMYCIN HCL 10 G IV SOLR
2000.0000 mg | Freq: Once | INTRAVENOUS | Status: AC
  Administered 2015-10-21: 2000 mg via INTRAVENOUS
  Filled 2015-10-21: qty 2000

## 2015-10-21 MED ORDER — RIVAROXABAN 20 MG PO TABS
20.0000 mg | ORAL_TABLET | Freq: Every day | ORAL | Status: DC
  Administered 2015-10-21 – 2015-10-28 (×8): 20 mg via ORAL
  Filled 2015-10-21 (×8): qty 1

## 2015-10-21 MED ORDER — SODIUM CHLORIDE 0.9 % IV SOLN
510.0000 mg | Freq: Once | INTRAVENOUS | Status: AC
  Administered 2015-10-21: 510 mg via INTRAVENOUS
  Filled 2015-10-21: qty 17

## 2015-10-21 MED ORDER — BENZONATATE 100 MG PO CAPS
100.0000 mg | ORAL_CAPSULE | Freq: Three times a day (TID) | ORAL | Status: DC
  Administered 2015-10-21 – 2015-10-26 (×15): 100 mg via ORAL
  Filled 2015-10-21 (×15): qty 1

## 2015-10-21 MED ORDER — VANCOMYCIN HCL IN DEXTROSE 1-5 GM/200ML-% IV SOLN
1000.0000 mg | Freq: Two times a day (BID) | INTRAVENOUS | Status: DC
  Filled 2015-10-21: qty 200

## 2015-10-21 MED ORDER — CEFAZOLIN SODIUM-DEXTROSE 2-4 GM/100ML-% IV SOLN
2.0000 g | Freq: Once | INTRAVENOUS | Status: AC
  Administered 2015-10-21: 2 g via INTRAVENOUS
  Filled 2015-10-21: qty 100

## 2015-10-21 NOTE — Progress Notes (Signed)
PHARMACY - PHYSICIAN COMMUNICATION CRITICAL VALUE ALERT - BLOOD CULTURE IDENTIFICATION (BCID)  Results for orders placed or performed during the hospital encounter of 10/19/15  Blood Culture ID Panel (Reflexed) (Collected: 10/20/2015  6:15 AM)  Result Value Ref Range   Enterococcus species NOT DETECTED NOT DETECTED   Vancomycin resistance NOT DETECTED NOT DETECTED   Listeria monocytogenes NOT DETECTED NOT DETECTED   Staphylococcus species DETECTED (A) NOT DETECTED   Staphylococcus aureus NOT DETECTED NOT DETECTED   Methicillin resistance NOT DETECTED NOT DETECTED   Streptococcus species NOT DETECTED NOT DETECTED   Streptococcus agalactiae NOT DETECTED NOT DETECTED   Streptococcus pneumoniae NOT DETECTED NOT DETECTED   Streptococcus pyogenes NOT DETECTED NOT DETECTED   Acinetobacter baumannii NOT DETECTED NOT DETECTED   Enterobacteriaceae species NOT DETECTED NOT DETECTED   Enterobacter cloacae complex NOT DETECTED NOT DETECTED   Escherichia coli NOT DETECTED NOT DETECTED   Klebsiella oxytoca NOT DETECTED NOT DETECTED   Klebsiella pneumoniae NOT DETECTED NOT DETECTED   Proteus species NOT DETECTED NOT DETECTED   Serratia marcescens NOT DETECTED NOT DETECTED   Carbapenem resistance NOT DETECTED NOT DETECTED   Haemophilus influenzae NOT DETECTED NOT DETECTED   Neisseria meningitidis NOT DETECTED NOT DETECTED   Pseudomonas aeruginosa NOT DETECTED NOT DETECTED   Candida albicans NOT DETECTED NOT DETECTED   Candida glabrata NOT DETECTED NOT DETECTED   Candida krusei NOT DETECTED NOT DETECTED   Candida parapsilosis NOT DETECTED NOT DETECTED   Candida tropicalis NOT DETECTED NOT DETECTED    Name of physician (or Provider) Contacted: Pincus Sanes, PA  Changes to prescribed antibiotics required: Ancef 2gm IV now for one dose. Mickel Baas will discuss with attending MD to see if Ancef should continue since CONS in 1/2 blood cx, could be contaminant.  Sherlon Handing, PharmD, BCPS Clinical  pharmacist, pager [phone] 10/21/2015  6:21 AM

## 2015-10-21 NOTE — Progress Notes (Signed)
PROGRESS NOTE    [name]  [id number] DOB: March 13, 1941 DOA: 10/19/2015 PCP: [name], MD   Brief Narrative:  HPI on 10/19/2015 by Dr. Leory Plowman is a 75 y.o. male with medical history significant of sCHF with EF 40-45%, hypertension, PVD, diabetes mellitus, hyperlipidemia, COPD, GERD, depression, anxiety, CAD, s/p of CABG, PE on Xarelto, stroke, BPH, who presents with shortness of breath.   Patient reports that he has been having shortness of breath for nearly a month, which has been progressively getting worse. He also has cough with greenish colored sputum production. He has mild intermittent chest pain. It is pleuritic and is aggravated by coughing. No fever or chills. Patient denies nausea, vomiting, abdominal pain, diarrhea. He states that he has mild dysuria sometimes, but no burning or increased urinary frequency. Patient does not have unilateral weakness, rashes. He denies any rectal bleeding, dark stool or melena.   Assessment & Plan   Acute on chronic respiratory failure with hypoxia -Most likely due to combination of COPD and CHF exacerbation. -Treatment and plan below  Acute on chronic systolic  Heart failure -Echocardiogram 07/19/14 showed EF 40-45% -BNP 446, +JVD, LE edema upon admission -CXR mild pulm vascular congestion -Contiue IV lasix 40mg  TID -Monitor intake/output, daily weights -Echocardiogram pending  -Continue lisinopril and bisoprolol  COPD exacerbation  -Cough has been ongoing for 4 weeks per patient.  -Upon admission, patient had bilateral wheezing on auscultation -Nebulizers: scheduled Duoneb and prn albuterol nebs -Continue Solu-Medrol, azithromycin, mucinex -Blood cultures from 10/20/2015 show 1/2 GPC -Respiratory viral panel unremarkable   ?Bacteremia vs contamination -Blood cultures from 10/20/2015 show 1/2 GPC -Repeat Blood cultures -Started on vanc per pharmacy  Microcytic anemia -Basline hemoglobin 12-13 (waas  12.3 on 07/26/15) -Currently Hemoglobin 8.2 (was 8.7 upon admission) -INR 1.39 -FOBT negative -Anemia panel: Iron 12, ferritin 38, B12 301 -Will give dose of ferrahem and B12 -IMA globin has remained stable at 8.2, well restart Xarelto per pharmacy -Continue to monitor CBC closely  History of PE -paient was on xarelto -CTA chest neg for PE   -Monitor respiratory status closely  Hyperlipidemia -Last LDL was 58 on 10/19/10 -Continue statin  Depression and anxiety -Continue Cymbalta and Ativan  GERD -Continue Protonix  Essential hypertension -Continue Zebeta and lisinopril (with holding parameters)  BPH -Continue Flomax  Diabetes Mellitus, type II -Last A1c 7.4 -Continue NPH, ISS, CBG monitoring  PAD and chronic leg wound -Patient has been followed with [facility]. He was seen on 10/18/16 by NP, Barnabas Lister. Per clinic note, wound care center is now managing and evaluating the wounds in his lower legs; he has unna boots on both lower legs. Pt refused to allow the ultrasonographer to removed the unna boot compression dressings in his lower legs in order to perform ABI's. Per Dr. Bridgett Larsson, Pt is not a candidate for any further surgical interventions, need to focus on wound care in this patient primarily. -Wound care consulted  Upper extremity edema -Per patient, he did have an ultrasound of his arms done prior to admission. I do see where a carotid ultrasound has been completed however do not see results of this test. -Will restart patient on Xarelto, and try to locate results of vascular study. -Keep arms elevated.  DVT Prophylaxis  Xarelto  Code Status: DNR  Family Communication: None at bedside  Disposition Plan: Admitted. Continue current treatement  Consultants None  Procedures  None  Antibiotics   Anti-infectives  Start     Dose/Rate Route Frequency Ordered Stop   10/21/15 2000  vancomycin (VANCOCIN) IVPB 1000 mg/200 mL  premix     1,000 mg 100 mL/hr over 120 Minutes Intravenous Every 12 hours 10/21/15 0730     10/21/15 1000  azithromycin (ZITHROMAX) tablet 250 mg     250 mg Oral Daily 10/20/15 0013 10/25/15 0959   10/21/15 0745  vancomycin (VANCOCIN) 2,000 mg in sodium chloride 0.9 % 500 mL IVPB     2,000 mg 125 mL/hr over 240 Minutes Intravenous  Once 10/21/15 0730     10/21/15 0700  ceFAZolin (ANCEF) IVPB 2g/100 mL premix     2 g 200 mL/hr over 30 Minutes Intravenous  Once 10/21/15 0621 10/21/15 0730   10/20/15 0015  azithromycin (ZITHROMAX) tablet 500 mg     500 mg Oral  Once 10/20/15 0013 10/20/15 0202      Subjective:   [name] seen and examined today. Patient feels his breathing has improved since admission. Complains of upper extremity tightness, especially in the right hand. States and ultrasound was done on the day of admission.  Denies chest pain, abdominal pain, nausea/vomiting, diarrhea,constipation, dizziness, headache.    Objective:   Vitals:   10/20/15 2007 10/20/15 2051 10/21/15 0352 10/21/15 0912  BP:  (!) 92/45 (!) 97/49   Pulse:  76 68   Resp:  20 18   Temp:  98.4 F (36.9 C) 97.7 F (36.5 C)   TempSrc:  Oral Oral   SpO2: 95% 95% 95% (!) 88%  Weight:   127.1 kg (280 lb 4.8 oz)     Intake/Output Summary (Last 24 hours) at 10/21/15 1128 Last data filed at 10/21/15 0841  Gross per 24 hour  Intake              840 ml  Output              875 ml  Net              -35 ml   Filed Weights   10/20/15 0128 10/21/15 0352  Weight: 131.1 kg (289 lb) 127.1 kg (280 lb 4.8 oz)    Exam  General: Well developed, well nourished, chronically ill, NAD  HEENT: NCAT, mucous membranes moist.   Neck: Supple, + JVD, no masses  Cardiovascular: S1 S2 auscultated, no rubs, murmurs or gallops. Regular rate and rhythm.  Respiratory: Diminished but clear, with equal chest rise  Abdomen: Soft, nontender, nondistended, + bowel sounds  Extremities: warm dry without cyanosis  clubbing. LE edema. 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 Upper extremity edema, R>L  Neuro: AAOx3, nonfocal  Skin: UNNA boots in place on LE  Psych: Normal affect and demeanor with intact judgement and insight   Data Reviewed: I have personally reviewed following labs and imaging studies  CBC:  Recent Labs Lab 10/19/15 1833  10/20/15 0601 10/20/15 1146 10/20/15 1803 10/21/15 0018 10/21/15 0708  WBC 13.8*  < > 12.6* 13.0* 14.8* 13.4* 13.7*  NEUTROABS 11.2*  --   --   --   --   --   --   HGB 8.7*  < > 8.9* 9.0* 8.4* 8.1* 8.2*  HCT 31.0*  < > 31.5* 31.8* 29.2* 28.1* 28.2*  MCV 70.9*  < > 70.0* 70.0* 70.2* 69.4* 68.8*  PLT 336  < > 305 321 311 293 299  < > = values in this interval not displayed. Basic Metabolic Panel:  Recent Labs Lab 10/19/15 1833 10/21/15 [id number]  NA 134* 134*  K 4.8 4.9  CL 98* 98*  CO2 29 26  GLUCOSE 123* 213*  BUN 15 27*  CREATININE 0.93 1.10  CALCIUM 8.4* 8.3*   GFR: Estimated Creatinine Clearance: 74.3 mL/min (by C-G formula based on SCr of 1.1 mg/dL). Liver Function Tests: No results for input(s): AST, ALT, ALKPHOS, BILITOT, PROT, ALBUMIN in the last 168 hours. No results for input(s): LIPASE, AMYLASE in the last 168 hours. No results for input(s): AMMONIA in the last 168 hours. Coagulation Profile:  Recent Labs Lab 10/19/15 1833  INR 1.39   Cardiac Enzymes: No results for input(s): CKTOTAL, CKMB, CKMBINDEX, TROPONINI in the last 168 hours. BNP (last 3 results) No results for input(s): PROBNP in the last 8760 hours. HbA1C: No results for input(s): HGBA1C in the last 72 hours. CBG:  Recent Labs Lab 10/20/15 0613 10/20/15 1125 10/20/15 1611 10/20/15 2116 10/21/15 0538  GLUCAP 175* 202* 213* 225* 213*   Lipid Profile: No results for input(s): CHOL, HDL, LDLCALC, TRIG, CHOLHDL, LDLDIRECT in the last 72 hours. Thyroid Function Tests: No results for input(s): TSH, T4TOTAL, FREET4, T3FREE, THYROIDAB in the last 72 hours. Anemia  Panel:  Recent Labs  10/21/15 0018  VITAMINB12 301  FOLATE 17.4  FERRITIN 38  TIBC 349  IRON 12*  RETICCTPCT 2.0   Urine analysis:    Component Value Date/Time   COLORURINE YELLOW 10/20/2015 [place] 10/20/2015 1655   LABSPEC 1.018 10/20/2015 1655   PHURINE 6.0 10/20/2015 1655   GLUCOSEU NEGATIVE 10/20/2015 1655   HGBUR NEGATIVE 10/20/2015 1655   HGBUR negative 03/06/2010 1419   BILIRUBINUR NEGATIVE 10/20/2015 1655   KETONESUR NEGATIVE 10/20/2015 1655   PROTEINUR NEGATIVE 10/20/2015 1655   UROBILINOGEN 0.2 09/13/2014 0945   NITRITE NEGATIVE 10/20/2015 1655   LEUKOCYTESUR NEGATIVE 10/20/2015 1655   Sepsis Labs: @LABRCNTIP (procalcitonin:4,lacticidven:4)  ) Recent Results (from the past 240 hour(s))  Respiratory Panel by PCR     Status: None   Collection Time: 10/20/15  2:12 AM  Result Value Ref Range Status   Adenovirus NOT DETECTED NOT DETECTED Final   Coronavirus 229E NOT DETECTED NOT DETECTED Final   Coronavirus HKU1 NOT DETECTED NOT DETECTED Final   Coronavirus NL63 NOT DETECTED NOT DETECTED Final   Coronavirus OC43 NOT DETECTED NOT DETECTED Final   Metapneumovirus NOT DETECTED NOT DETECTED Final   Rhinovirus / Enterovirus NOT DETECTED NOT DETECTED Final   Influenza A NOT DETECTED NOT DETECTED Final   Influenza A H1 NOT DETECTED NOT DETECTED Final   Influenza A H1 2009 NOT DETECTED NOT DETECTED Final   Influenza A H3 NOT DETECTED NOT DETECTED Final   Influenza B NOT DETECTED NOT DETECTED Final   Parainfluenza Virus 1 NOT DETECTED NOT DETECTED Final   Parainfluenza Virus 2 NOT DETECTED NOT DETECTED Final   Parainfluenza Virus 3 NOT DETECTED NOT DETECTED Final   Parainfluenza Virus 4 NOT DETECTED NOT DETECTED Final   Respiratory Syncytial Virus NOT DETECTED NOT DETECTED Final   Bordetella pertussis NOT DETECTED NOT DETECTED Final   Chlamydophila pneumoniae NOT DETECTED NOT DETECTED Final   Mycoplasma pneumoniae NOT DETECTED NOT DETECTED Final   Culture, blood (Routine X 2) w Reflex to ID Panel     Status: None (Preliminary result)   Collection Time: 10/20/15  6:15 AM  Result Value Ref Range Status   Specimen Description BLOOD RIGHT HAND  Final   Special Requests IN PEDIATRIC BOTTLE 2ML  Final   Culture  Setup Time  Final    PED GRAM POSITIVE COCCI IN CLUSTERS CRITICAL RESULT CALLED TO, READ BACK BY AND VERIFIED WITH: TO KAMEND(PHARD) BY TCLEVELAND 10/21/2015 AT 5:44AM Organism ID to follow    Culture PENDING  Incomplete   Report Status PENDING  Incomplete  Blood Culture ID Panel (Reflexed)     Status: Abnormal   Collection Time: 10/20/15  6:15 AM  Result Value Ref Range Status   Enterococcus species NOT DETECTED NOT DETECTED Final   Vancomycin resistance NOT DETECTED NOT DETECTED Final   Listeria monocytogenes NOT DETECTED NOT DETECTED Final   Staphylococcus species DETECTED (A) NOT DETECTED Final    Comment: CRITICAL RESULT CALLED TO, READ BACK BY AND VERIFIED WITH: TO KAMENED(PHARD) BY TCLEVELAND 10/21/2015 AT 5:44AM    Staphylococcus aureus NOT DETECTED NOT DETECTED Final   Methicillin resistance NOT DETECTED NOT DETECTED Final   Streptococcus species NOT DETECTED NOT DETECTED Final   Streptococcus agalactiae NOT DETECTED NOT DETECTED Final   Streptococcus pneumoniae NOT DETECTED NOT DETECTED Final   Streptococcus pyogenes NOT DETECTED NOT DETECTED Final   Acinetobacter baumannii NOT DETECTED NOT DETECTED Final   Enterobacteriaceae species NOT DETECTED NOT DETECTED Final   Enterobacter cloacae complex NOT DETECTED NOT DETECTED Final   Escherichia coli NOT DETECTED NOT DETECTED Final   Klebsiella oxytoca NOT DETECTED NOT DETECTED Final   Klebsiella pneumoniae NOT DETECTED NOT DETECTED Final   Proteus species NOT DETECTED NOT DETECTED Final   Serratia marcescens NOT DETECTED NOT DETECTED Final   Carbapenem resistance NOT DETECTED NOT DETECTED Final   Haemophilus influenzae NOT DETECTED NOT DETECTED Final   Neisseria  meningitidis NOT DETECTED NOT DETECTED Final   Pseudomonas aeruginosa NOT DETECTED NOT DETECTED Final   Candida albicans NOT DETECTED NOT DETECTED Final   Candida glabrata NOT DETECTED NOT DETECTED Final   Candida krusei NOT DETECTED NOT DETECTED Final   Candida parapsilosis NOT DETECTED NOT DETECTED Final   Candida tropicalis NOT DETECTED NOT DETECTED Final  MRSA PCR Screening     Status: None   Collection Time: 10/20/15  8:44 AM  Result Value Ref Range Status   MRSA by PCR NEGATIVE NEGATIVE Final    Comment:        The GeneXpert MRSA Assay (FDA approved for NASAL specimens only), is one component of a comprehensive MRSA colonization surveillance program. It is not intended to diagnose MRSA infection nor to guide or monitor treatment for MRSA infections.       Radiology Studies: Dg Chest 2 View  Result Date: 10/19/2015 CLINICAL DATA:  Productive cough. EXAM: CHEST  2 VIEW COMPARISON:  08/24/2014 FINDINGS: Postsurgical changes from CABG is stable. Cardiomediastinal silhouette is mildly enlarged. Mediastinal contours appear intact. There is no evidence of focal airspace consolidation, pleural effusion or pneumothorax. Mild pulmonary vascular congestion. Osseous structures are without acute abnormality. Soft tissues are grossly normal. IMPRESSION: Mildly enlarged cardiac silhouette. Mild pulmonary vascular congestion. Electronically Signed   By: Fidela Salisbury M.D.   On: 10/19/2015 17:29  Ct Angio Chest Pe W And/or Wo Contrast  Result Date: 10/19/2015 CLINICAL DATA:  Shortness of Breath with productive cough EXAM: CT ANGIOGRAPHY CHEST WITH CONTRAST TECHNIQUE: Multidetector CT imaging of the chest was performed using the standard protocol during bolus administration of intravenous contrast. Multiplanar CT image reconstructions and MIPs were obtained to evaluate the vascular anatomy. CONTRAST:  80 mL Isovue 370. COMPARISON:  None. FINDINGS: Mediastinum/Lymph Nodes: The thoracic inlet  is within normal limits. No significant hilar or mediastinal adenopathy is  noted. Cardiovascular: Pulmonary artery demonstrates a normal branching pattern. No findings to suggest pulmonary emboli are seen. Aortic calcifications are noted without aneurysmal dilatation or dissection. Changes consistent with coronary bypass grafting are noted. Multiple coronary stents are noted within the bypass grafts. Native coronary calcifications are seen. No right heart strain is noted. No pericardial effusion is seen. Lungs/Pleura: Bilateral pleural effusions right greater than left are seen. Bilateral dependent atelectatic changes are noted. No significant pulmonary edema is noted. Upper abdomen: Visualized upper abdomen is within normal limits. Musculoskeletal: Degenerative changes of the thoracic spine are noted. Review of the MIP images confirms the above findings. IMPRESSION: No evidence of pulmonary emboli. Findings of prior coronary bypass grafting with stenting. No acute abnormality noted. Electronically Signed   By: Inez Catalina M.D.   On: 10/19/2015 21:41    Scheduled Meds: . atorvastatin  40 mg Oral q1800  . azithromycin  250 mg Oral Daily  . bisoprolol  2.5 mg Oral Daily  . collagenase  1 application Topical Daily  . dextromethorphan-guaiFENesin  1 tablet Oral BID  . docusate sodium  100 mg Oral BID  . DULoxetine  90 mg Oral Daily  . feeding supplement (ENSURE ENLIVE)  237 mL Oral Q24H  . feeding supplement (PRO-STAT SUGAR FREE 64)  30 mL Oral BID  . furosemide  40 mg Intravenous TID  . gabapentin  400 mg Oral TID  . insulin aspart  0-9 Units Subcutaneous TID WC  . insulin NPH Human  15 Units Subcutaneous BID AC & HS  . ipratropium-albuterol  3 mL Nebulization TID  . lidocaine  1 patch Transdermal Q24H  . lisinopril  2.5 mg Oral Daily  . LORazepam  0.25 mg Oral QHS  . lubiprostone  8 mcg Oral BID WC  . methylPREDNISolone (SOLU-MEDROL) injection  60 mg Intravenous Q12H  . multivitamin  1 tablet  Oral Daily  . naloxegol oxalate  25 mg Oral Daily  . naproxen  500 mg Oral BID WC  . pantoprazole  40 mg Oral Daily  . polyvinyl alcohol  1 drop Both Eyes BID  . rivaroxaban  20 mg Oral Q supper  . sodium chloride flush  3 mL Intravenous Q12H  . tamsulosin  0.4 mg Oral QPC supper  . traZODone  100 mg Oral QHS  . vancomycin  2,000 mg Intravenous Once  . vancomycin  1,000 mg Intravenous Q12H  . [START ON 10/24/2015] Vitamin D (Ergocalciferol)  50,000 Units Oral Q Wed   Continuous Infusions:    LOS: 2 days   Time Spent in minutes   30 minutes  Jatara Huettner D.O. on 10/21/2015 at 11:28 AM  Between 7am to 7pm - Pager - [phone]  After 7pm go to [email or web address] - password [email or web address]  And look for the night coverage person covering for me after hours  [facility] Office  [phone]

## 2015-10-21 NOTE — Progress Notes (Signed)
ANTICOAGULATION CONSULT NOTE - Initial Consult  Pharmacy Consult for Xarelto Indication: pulmonary embolus  Allergies  Allergen Reactions  . Vancomycin Other (See Comments)    "red man syndrome"  . Diazepam Anxiety    REACTION: makes patient cry    Patient Measurements: Weight: 280 lb 4.8 oz (127.1 kg) (bed scale)  Vital Signs: Temp: 97.7 F (36.5 C) (07/30 0352) Temp Source: Oral (07/30 0352) BP: 97/49 (07/30 0352) Pulse Rate: 68 (07/30 0352)  Labs:  Recent Labs  10/19/15 1833  10/20/15 1146 10/20/15 1803 10/21/15 0018  HGB 8.7*  < > 9.0* 8.4* 8.1*  HCT 31.0*  < > 31.8* 29.2* 28.1*  PLT 336  < > 321 311 293  LABPROT 17.2*  --   --   --   --   INR 1.39  --   --   --   --   CREATININE 0.93  --   --   --   --   < > = values in this interval not displayed.  Estimated Creatinine Clearance: 87.8 mL/min (by C-G formula based on SCr of 0.93 mg/dL).   Medical History: Past Medical History:  Diagnosis Date  . Adhesive capsulitis   . Angina   . Anxiety   . CAD (coronary artery disease)   . CHF (congestive heart failure) ([place])   . Chronic back pain   . Chronic kidney disease    hx of BPH  . COPD (chronic obstructive pulmonary disease) ([place])   . CVA (cerebral infarction) Questionable history  . Depression   . Diabetes mellitus   . Diverticulosis   . Falls frequently 06/2014  . GERD (gastroesophageal reflux disease)   . Hyperlipidemia   . Hypertension   . 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 Myocardial infarction ([place])   . Neuromuscular disorder ([place])    HX of diabetic periferal neuropathy  . On home oxygen therapy    "1.5L prn" (04/12/2014)  . Osteomyelitis ([place]) 2012   Sternoclavicular joint   . PE (pulmonary embolism) 10-15 years ago   Lifelong Coumadin  . Peripheral vascular disease ([place])   . Shortness of breath   . Sleep apnea     Assessment: -Home xarelto: 20mg  daily with supper -Held on admission for  FOBT, now negative -H/H/Plts 8.1/28.1/293  Goal of Therapy:  Monitor platelets by anticoagulation protocol: Yes   Plan:  -Restart home dose of 20mg  with supper this evening  -Will monitor CBC and renal function  Myer Peer Grayland Ormond), PharmD  [id number] Pharmacy Resident Pager: [phone] 10/21/2015 8:55 AM

## 2015-10-21 NOTE — Progress Notes (Addendum)
Pharmacy Antibiotic Note  [name] is a 75 y.o. male admitted on 10/19/2015, now with bacteremia.  Pharmacy has been consulted for vancomycin dosing.  Note pt has had reaction to vanc in the past, will slow infusion rates by half.  Plan: Vancomycin 2000mg  x1 then 1000mg  IV every 12 hours.  Goal trough 15-20 mcg/mL.  Weight: 280 lb 4.8 oz (127.1 kg) (bed scale)  Temp (24hrs), Avg:98.1 F (36.7 C), Min:97.7 F (36.5 C), Max:98.4 F (36.9 C)   Recent Labs Lab 10/19/15 1833 10/20/15 0119 10/20/15 0601 10/20/15 1146 10/20/15 1803 10/21/15 0018  WBC 13.8* 13.4* 12.6* 13.0* 14.8* 13.4*  CREATININE 0.93  --   --   --   --   --     Estimated Creatinine Clearance: 87.8 mL/min (by C-G formula based on SCr of 0.93 mg/dL).    Allergies  Allergen Reactions  . Vancomycin Other (See Comments)    "red man syndrome"  . Diazepam Anxiety    REACTION: makes patient cry     Thank you for allowing pharmacy to be a part of this patient's care.  Wynona Neat, PharmD, BCPS  10/21/2015 7:28 AM

## 2015-10-22 ENCOUNTER — Inpatient Hospital Stay (HOSPITAL_COMMUNITY): Payer: Medicare Other

## 2015-10-22 DIAGNOSIS — I5023 Acute on chronic systolic (congestive) heart failure: Secondary | ICD-10-CM

## 2015-10-22 LAB — CBC
HEMATOCRIT: 32 % — AB (ref 39.0–52.0)
HEMOGLOBIN: 8.9 g/dL — AB (ref 13.0–17.0)
MCH: 19.7 pg — ABNORMAL LOW (ref 26.0–34.0)
MCHC: 27.8 g/dL — AB (ref 30.0–36.0)
MCV: 71 fL — AB (ref 78.0–100.0)
Platelets: 282 10*3/uL (ref 150–400)
RBC: 4.51 MIL/uL (ref 4.22–5.81)
RDW: 18 % — ABNORMAL HIGH (ref 11.5–15.5)
WBC: 13.2 10*3/uL — AB (ref 4.0–10.5)

## 2015-10-22 LAB — BASIC METABOLIC PANEL
Anion gap: 10 (ref 5–15)
BUN: 31 mg/dL — AB (ref 6–20)
CHLORIDE: 98 mmol/L — AB (ref 101–111)
CO2: 28 mmol/L (ref 22–32)
CREATININE: 1.16 mg/dL (ref 0.61–1.24)
Calcium: 8.5 mg/dL — ABNORMAL LOW (ref 8.9–10.3)
GFR calc Af Amer: >60 mL/min (ref >60)
GFR calc non Af Amer: 59 mL/min — ABNORMAL LOW (ref >60)
Glucose, Bld: 221 mg/dL — ABNORMAL HIGH (ref 65–99)
POTASSIUM: 4.5 mmol/L (ref 3.5–5.1)
SODIUM: 136 mmol/L (ref 135–145)

## 2015-10-22 LAB — GLUCOSE, CAPILLARY
GLUCOSE-CAPILLARY: 221 mg/dL — AB (ref 65–99)
GLUCOSE-CAPILLARY: 279 mg/dL — AB (ref 65–99)
GLUCOSE-CAPILLARY: 267 mg/dL — AB (ref 65–99)
Glucose-Capillary: 209 mg/dL — ABNORMAL HIGH (ref 65–99)

## 2015-10-22 LAB — ECHOCARDIOGRAM COMPLETE
HEIGHTINCHES: 68 in
WEIGHTICAEL: 4512 [oz_av]

## 2015-10-22 MED ORDER — PERFLUTREN LIPID MICROSPHERE
1.0000 mL | INTRAVENOUS | Status: AC | PRN
  Administered 2015-10-22: 2 mL via INTRAVENOUS
  Filled 2015-10-22: qty 10

## 2015-10-22 MED ORDER — PERFLUTREN LIPID MICROSPHERE
INTRAVENOUS | Status: AC
  Administered 2015-10-22: 2 mL via INTRAVENOUS
  Filled 2015-10-22: qty 10

## 2015-10-22 NOTE — Progress Notes (Signed)
PROGRESS NOTE    [name]  [id number] DOB: 1941-03-22 DOA: 10/19/2015 PCP: Todd Sabal, MD   Brief Narrative:  HPI on 10/19/2015 by Dr. Leory [name] is a 75 y.o. male with medical history significant of sCHF with EF 40-45%, hypertension, PVD, diabetes mellitus, hyperlipidemia, COPD, GERD, depression, anxiety, CAD, s/p of CABG, PE on Xarelto, stroke, BPH, who presents with shortness of breath.   Patient reports that he has been having shortness of breath for nearly a month, which has been progressively getting worse. He also has cough with greenish colored sputum production. He has mild intermittent chest pain. It is pleuritic and is aggravated by coughing. No fever or chills. Patient denies nausea, vomiting, abdominal pain, diarrhea. He states that he has mild dysuria sometimes, but no burning or increased urinary frequency. Patient does not have unilateral weakness, rashes. He denies any rectal bleeding, dark stool or melena.   Assessment & Plan   Acute on chronic respiratory failure with hypoxia -Most likely due to combination of COPD and CHF exacerbation. -Treatment and plan below  Acute on chronic systolic  Heart failure -Echocardiogram 07/19/14 showed EF 40-45% -BNP 446, +JVD, LE edema upon admission -CXR mild pulm vascular congestion -Contiue IV lasix 40mg  TID -Monitor intake/output, daily weights -Echocardiogram pending  -Continue lisinopril and bisoprolol  COPD exacerbation  -Cough has been ongoing for 4 weeks per patient.  -Upon admission, patient had bilateral wheezing on auscultation -Nebulizers: scheduled Duoneb and prn albuterol nebs -Continue Solu-Medrol, azithromycin, mucinex -Blood cultures from 10/20/2015 show 1/2 GPC -Respiratory viral panel unremarkable   ?Bacteremia vs contamination -Blood cultures from 10/20/2015 show 1/2 GPC -Repeat Blood cultures pending   Microcytic anemia -Basline hemoglobin 12-13 (waas 12.3 on  07/26/15) -Currently Hemoglobin 8.9 (was 8.7 upon admission) -INR 1.39 -FOBT negative -Anemia panel: Iron 12, ferritin 38, B12 301 -Will give dose of ferrahem and B12 -IMA globin has remained stable at 8.2, well restart Xarelto per pharmacy -Continue to monitor CBC closely  History of PE -paient was on xarelto -CTA chest neg for PE   -Monitor respiratory status closely -Restarted xarelto  Hyperlipidemia -Last LDL was 58 on 10/19/10 -Continue statin  Depression and anxiety -Continue Cymbalta and Ativan  GERD -Continue Protonix  Essential hypertension -Continue Zebeta and lisinopril (with holding parameters)  BPH -Continue Flomax  Diabetes Mellitus, type II -Last A1c 7.4 -Continue NPH, ISS, CBG monitoring  PAD and chronic leg wound -Patient has been followed with [facility]. He was seen on 10/18/16 by NP, [name]. Per clinic note, wound care center is now managing and evaluating the wounds in his lower legs; he has unna boots on both lower legs. Pt refused to allow the ultrasonographer to removed the unna boot compression dressings in his lower legs in order to perform ABI's. Per Dr. Bridgett [name], Pt is not a candidate for any further surgical interventions, need to focus on wound care in this patient primarily. -Wound care consulted  Upper extremity edema -Per patient, he did have an ultrasound of his arms done prior to admission. I do see where a carotid ultrasound has been completed however do not see results of this test. -Will restart patient on Xarelto, and try to locate results of vascular study. -Keep arms elevated.  DVT Prophylaxis  Xarelto  Code Status: DNR  Family Communication: None at bedside  Disposition Plan: Admitted. Continue current treatment. Pending repeat blood cultures.   Consultants None  Procedures  None  Antibiotics  Anti-infectives    Start     Dose/Rate Route Frequency Ordered Stop   10/21/15  2000  vancomycin (VANCOCIN) IVPB 1000 mg/200 mL premix  Status:  Discontinued     1,000 mg 100 mL/hr over 120 Minutes Intravenous Every 12 hours 10/21/15 0730 10/21/15 1146   10/21/15 1000  azithromycin (ZITHROMAX) tablet 250 mg     250 mg Oral Daily 10/20/15 0013 10/25/15 0959   10/21/15 0745  vancomycin (VANCOCIN) 2,000 mg in sodium chloride 0.9 % 500 mL IVPB     2,000 mg 125 mL/hr over 240 Minutes Intravenous  Once 10/21/15 0730 10/21/15 1329   10/21/15 0700  ceFAZolin (ANCEF) IVPB 2g/100 mL premix     2 g 200 mL/hr over 30 Minutes Intravenous  Once 10/21/15 0621 10/21/15 0730   10/20/15 0015  azithromycin (ZITHROMAX) tablet 500 mg     500 mg Oral  Once 10/20/15 0013 10/20/15 0202      Subjective:   [name] seen and examined today. Patient feels his breathing has improved since admission. Still complains of upper extremity tightness specially the right hand.  Denies chest pain, abdominal pain, nausea/vomiting, diarrhea,constipation, dizziness, headache.    Objective:   Vitals:   10/21/15 2005 10/21/15 2244 10/22/15 0345 10/22/15 0854  BP:  (!) 101/56 (!) 103/49   Pulse:  78 78   Resp:  20 19   Temp:      TempSrc:      SpO2: 93% 94% 93% 92%  Weight:   127.9 kg (282 lb)     Intake/Output Summary (Last 24 hours) at 10/22/15 1022 Last data filed at 10/22/15 0904  Gross per 24 hour  Intake             1200 ml  Output             1525 ml  Net             -325 ml   Filed Weights   10/20/15 0128 10/21/15 0352 10/22/15 0345  Weight: 131.1 kg (289 lb) 127.1 kg (280 lb 4.8 oz) 127.9 kg (282 lb)    Exam  General: Well developed, well nourished, chronically ill, NAD  HEENT: NCAT, mucous membranes moist.   Cardiovascular: S1 S2 auscultated, no murmurs, RRR  Respiratory: Diminished but clear, with equal chest rise  Abdomen: Soft, nontender, nondistended, + bowel sounds  Extremities: warm dry without cyanosis clubbing. LE edema. 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 Upper  extremity edema, R>L  Neuro: AAOx3, nonfocal  Skin: UNNA boots in place on LE  Psych: Normal affect and demeanor with intact judgement and insight, pleasant   Data Reviewed: I have personally reviewed following labs and imaging studies  CBC:  Recent Labs Lab 10/19/15 1833  10/20/15 1803 10/21/15 0018 10/21/15 0708 10/21/15 1006 10/22/15 0810  WBC 13.8*  < > 14.8* 13.4* 13.7* 15.3* 13.2*  NEUTROABS 11.2*  --   --   --   --   --   --   HGB 8.7*  < > 8.4* 8.1* 8.2* 9.0* 8.9*  HCT 31.0*  < > 29.2* 28.1* 28.2* 32.4* 32.0*  MCV 70.9*  < > 70.2* 69.4* 68.8* 70.7* 71.0*  PLT 336  < > 311 293 299 298 282  < > = values in this interval not displayed. Basic Metabolic Panel:  Recent Labs Lab 10/19/15 1833 10/21/15 0708 10/22/15 0317  NA 134* 134* 136  K 4.8 4.9 4.5  CL 98* 98* 98*  CO2 29 26  28  GLUCOSE 123* 213* 221*  BUN 15 27* 31*  CREATININE 0.93 1.10 1.16  CALCIUM 8.4* 8.3* 8.5*   GFR: Estimated Creatinine Clearance: 70.7 mL/min (by C-G formula based on SCr of 1.16 mg/dL). Liver Function Tests: No results for input(s): AST, ALT, ALKPHOS, BILITOT, PROT, ALBUMIN in the last 168 hours. No results for input(s): LIPASE, AMYLASE in the last 168 hours. No results for input(s): AMMONIA in the last 168 hours. Coagulation Profile:  Recent Labs Lab 10/19/15 1833  INR 1.39   Cardiac Enzymes: No results for input(s): CKTOTAL, CKMB, CKMBINDEX, TROPONINI in the last 168 hours. BNP (last 3 results) No results for input(s): PROBNP in the last 8760 hours. HbA1C: No results for input(s): HGBA1C in the last 72 hours. CBG:  Recent Labs Lab 10/21/15 0538 10/21/15 1127 10/21/15 1622 10/21/15 2235 10/22/15 0620  GLUCAP 213* 240* 201* 189* 221*   Lipid Profile: No results for input(s): CHOL, HDL, LDLCALC, TRIG, CHOLHDL, LDLDIRECT in the last 72 hours. Thyroid Function Tests: No results for input(s): TSH, T4TOTAL, FREET4, T3FREE, THYROIDAB in the last 72 hours. Anemia  Panel:  Recent Labs  10/21/15 0018  VITAMINB12 301  FOLATE 17.4  FERRITIN 38  TIBC 349  IRON 12*  RETICCTPCT 2.0   Urine analysis:    Component Value Date/Time   COLORURINE YELLOW 10/20/2015 1655   APPEARANCEUR CLEAR 10/20/2015 1655   LABSPEC 1.018 10/20/2015 1655   PHURINE 6.0 10/20/2015 1655   GLUCOSEU NEGATIVE 10/20/2015 1655   HGBUR NEGATIVE 10/20/2015 1655   HGBUR negative 03/06/2010 1419   BILIRUBINUR NEGATIVE 10/20/2015 1655   KETONESUR NEGATIVE 10/20/2015 1655   PROTEINUR NEGATIVE 10/20/2015 1655   UROBILINOGEN 0.2 09/13/2014 0945   NITRITE NEGATIVE 10/20/2015 1655   LEUKOCYTESUR NEGATIVE 10/20/2015 1655   Sepsis Labs: @LABRCNTIP (procalcitonin:4,lacticidven:4)  ) Recent Results (from the past 240 hour(s))  Respiratory Panel by PCR     Status: None   Collection Time: 10/20/15  2:12 AM  Result Value Ref Range Status   Adenovirus NOT DETECTED NOT DETECTED Final   Coronavirus 229E NOT DETECTED NOT DETECTED Final   Coronavirus HKU1 NOT DETECTED NOT DETECTED Final   Coronavirus NL63 NOT DETECTED NOT DETECTED Final   Coronavirus OC43 NOT DETECTED NOT DETECTED Final   Metapneumovirus NOT DETECTED NOT DETECTED Final   Rhinovirus / Enterovirus NOT DETECTED NOT DETECTED Final   Influenza A NOT DETECTED NOT DETECTED Final   Influenza A H1 NOT DETECTED NOT DETECTED Final   Influenza A H1 2009 NOT DETECTED NOT DETECTED Final   Influenza A H3 NOT DETECTED NOT DETECTED Final   Influenza B NOT DETECTED NOT DETECTED Final   Parainfluenza Virus 1 NOT DETECTED NOT DETECTED Final   Parainfluenza Virus 2 NOT DETECTED NOT DETECTED Final   Parainfluenza Virus 3 NOT DETECTED NOT DETECTED Final   Parainfluenza Virus 4 NOT DETECTED NOT DETECTED Final   Respiratory Syncytial Virus NOT DETECTED NOT DETECTED Final   Bordetella pertussis NOT DETECTED NOT DETECTED Final   Chlamydophila pneumoniae NOT DETECTED NOT DETECTED Final   Mycoplasma pneumoniae NOT DETECTED NOT DETECTED Final   Culture, blood (Routine X 2) w Reflex to ID Panel     Status: None (Preliminary result)   Collection Time: 10/20/15  6:15 AM  Result Value Ref Range Status   Specimen Description BLOOD RIGHT HAND  Final   Special Requests IN PEDIATRIC BOTTLE 2ML  Final   Culture  Setup Time   Final    PED GRAM POSITIVE COCCI IN  CLUSTERS CRITICAL RESULT CALLED TO, READ BACK BY AND VERIFIED WITH: TO KAMEND(PHARD) BY TCLEVELAND 10/21/2015 AT 5:44AM Organism ID to follow    Culture NO GROWTH 1 DAY  Final   Report Status PENDING  Incomplete  Blood Culture ID Panel (Reflexed)     Status: Abnormal   Collection Time: 10/20/15  6:15 AM  Result Value Ref Range Status   Enterococcus species NOT DETECTED NOT DETECTED Final   Vancomycin resistance NOT DETECTED NOT DETECTED Final   Listeria monocytogenes NOT DETECTED NOT DETECTED Final   Staphylococcus species DETECTED (A) NOT DETECTED Final    Comment: CRITICAL RESULT CALLED TO, READ BACK BY AND VERIFIED WITH: TO KAMENED(PHARD) BY TCLEVELAND 10/21/2015 AT 5:44AM    Staphylococcus aureus NOT DETECTED NOT DETECTED Final   Methicillin resistance NOT DETECTED NOT DETECTED Final   Streptococcus species NOT DETECTED NOT DETECTED Final   Streptococcus agalactiae NOT DETECTED NOT DETECTED Final   Streptococcus pneumoniae NOT DETECTED NOT DETECTED Final   Streptococcus pyogenes NOT DETECTED NOT DETECTED Final   Acinetobacter baumannii NOT DETECTED NOT DETECTED Final   Enterobacteriaceae species NOT DETECTED NOT DETECTED Final   Enterobacter cloacae complex NOT DETECTED NOT DETECTED Final   Escherichia coli NOT DETECTED NOT DETECTED Final   Klebsiella oxytoca NOT DETECTED NOT DETECTED Final   Klebsiella pneumoniae NOT DETECTED NOT DETECTED Final   Proteus species NOT DETECTED NOT DETECTED Final   Serratia marcescens NOT DETECTED NOT DETECTED Final   Carbapenem resistance NOT DETECTED NOT DETECTED Final   Haemophilus influenzae NOT DETECTED NOT DETECTED Final    Neisseria meningitidis NOT DETECTED NOT DETECTED Final   Pseudomonas aeruginosa NOT DETECTED NOT DETECTED Final   Candida albicans NOT DETECTED NOT DETECTED Final   Candida glabrata NOT DETECTED NOT DETECTED Final   Candida krusei NOT DETECTED NOT DETECTED Final   Candida parapsilosis NOT DETECTED NOT DETECTED Final   Candida tropicalis NOT DETECTED NOT DETECTED Final  Culture, blood (Routine X 2) w Reflex to ID Panel     Status: None (Preliminary result)   Collection Time: 10/20/15  6:18 AM  Result Value Ref Range Status   Specimen Description BLOOD RIGHT ARM  Final   Special Requests IN PEDIATRIC BOTTLE 1ML  Final   Culture NO GROWTH 1 DAY  Final   Report Status PENDING  Incomplete  MRSA PCR Screening     Status: None   Collection Time: 10/20/15  8:44 AM  Result Value Ref Range Status   MRSA by PCR NEGATIVE NEGATIVE Final    Comment:        The GeneXpert MRSA Assay (FDA approved for NASAL specimens only), is one component of a comprehensive MRSA colonization surveillance program. It is not intended to diagnose MRSA infection nor to guide or monitor treatment for MRSA infections.       Radiology Studies: No results found.   Scheduled Meds: . atorvastatin  40 mg Oral q1800  . azithromycin  250 mg Oral Daily  . benzonatate  100 mg Oral TID  . bisoprolol  2.5 mg Oral Daily  . collagenase  1 application Topical Daily  . dextromethorphan-guaiFENesin  1 tablet Oral BID  . docusate sodium  100 mg Oral BID  . DULoxetine  90 mg Oral Daily  . feeding supplement (ENSURE ENLIVE)  237 mL Oral Q24H  . feeding supplement (PRO-STAT SUGAR FREE 64)  30 mL Oral BID  . furosemide  40 mg Intravenous TID  . gabapentin  400 mg Oral TID  .  insulin aspart  0-9 Units Subcutaneous TID WC  . insulin NPH Human  15 Units Subcutaneous BID AC & HS  . ipratropium-albuterol  3 mL Nebulization TID  . lidocaine  1 patch Transdermal Q24H  . lisinopril  2.5 mg Oral Daily  . LORazepam  0.25 mg Oral  QHS  . lubiprostone  8 mcg Oral BID WC  . methylPREDNISolone (SOLU-MEDROL) injection  60 mg Intravenous Q12H  . multivitamin  1 tablet Oral Daily  . naloxegol oxalate  25 mg Oral Daily  . naproxen  500 mg Oral BID WC  . pantoprazole  40 mg Oral Daily  . polyvinyl alcohol  1 drop Both Eyes BID  . rivaroxaban  20 mg Oral Q supper  . sodium chloride flush  3 mL Intravenous Q12H  . tamsulosin  0.4 mg Oral QPC supper  . traZODone  100 mg Oral QHS  . [START ON 10/24/2015] Vitamin D (Ergocalciferol)  50,000 Units Oral Q Wed   Continuous Infusions:    LOS: 3 days   Time Spent in minutes   30 minutes  Valeri Sula D.O. on 10/22/2015 at 10:22 AM  Between 7am to 7pm - Pager - [phone]  After 7pm go to [email or web address] - password [email or web address]  And look for the night coverage person covering for me after hours  [facility] Office  [phone]

## 2015-10-22 NOTE — Clinical Social Work Note (Signed)
Clinical Social Work Assessment  Patient Details  Name: [name] MRN: [id number] Date of Birth: [birth year]  Date of referral:  10/22/15               Reason for consult:  Discharge Planning                Permission sought to share information with:  Facility Sport and exercise psychologist, Family Supports Permission granted to share information::  Yes, Verbal Permission Granted  Name::     Sahand Sposito  Agency::  [facility]  Relationship::  Spouse  Contact Information:  [phone]  Housing/Transportation Living arrangements for the past 2 months:  [place] of Information:  Medical Team, Spouse Patient Interpreter Needed:  None Criminal Activity/Legal Involvement Pertinent to Current Situation/Hospitalization:  No - Comment as needed Significant Relationships:  Spouse Lives with:  Facility Resident Do you feel safe going back to the place where you live?  Yes Need for family participation in patient care:  Yes (Comment)  Care giving concerns:  Patient is from [facility].   Social Worker assessment / plan:  Patient not fully oriented. CSW called patient's wife. CSW introduced role and explained that discharge planning would be discussed. Patient's wife confirmed that he is from [facility] and the plan is for him to return. Patient has been there for about 1 year. He will need PTAR. No further concerns. CSW encouraged patient's wife to contact CSW as needed. CSW will continue to follow patient and his wife for support and facilitate discharge back to SNF once medically stable, likely tomorrow.  Employment status:  Retired Nurse, adult PT Recommendations:  [place] / Referral to community resources:  Other (Comment Required) (Plan is to return to [facility].)  Patient/Family's Response to care:  Patient not fully oriented. Patient's wife agreeable to return to [facility]. Patient's wife  supportive and involved in patient's care. Patient's wife appreciated social work intervention.  Patient/Family's Understanding of and Emotional Response to Diagnosis, Current Treatment, and Prognosis:  Patient not fully oriented. Patient understand medical needs that require rehab. Discussed diabetic neuropathy that prevents patient from walking more than a few steps. Patient's wife appears happy with hospital care.  Emotional Assessment Appearance:  Appears stated age Attitude/Demeanor/Rapport:  Unable to Assess Affect (typically observed):  Unable to Assess Orientation:  Oriented to Self, Oriented to Place Alcohol / Substance use:  Never Used Psych involvement (Current and /or in the community):  No (Comment)  Discharge Needs  Concerns to be addressed:  Care Coordination Readmission within the last 30 days:  No Current discharge risk:  Cognitively Impaired, Dependent with Mobility Barriers to Discharge:  No Barriers Identified   Candie Chroman, LCSW 10/22/2015, 3:20 PM

## 2015-10-22 NOTE — Care Management Important Message (Signed)
Important Message  Patient Details  Name: [name] MRN: [id number] Date of Birth: 20-Mar-1941   Medicare Important Message Given:  Yes    Loann Quill 10/22/2015, 3:19 PM

## 2015-10-22 NOTE — Progress Notes (Signed)
Inpatient Diabetes Program Recommendations  AACE/ADA: New Consensus Statement on Inpatient Glycemic Control (2015)  Target Ranges:  Prepandial:   less than 140 mg/dL      Peak postprandial:   less than 180 mg/dL (1-2 hours)      Critically ill patients:  140 - 180 mg/dL   Lab Results  Component Value Date   GLUCAP 221 (H) 10/22/2015   HGBA1C 7.4 (H) 05/10/2014    Review of Glycemic ControlResults for [name], [name] (MRN [id number]) as of 10/22/2015 11:10  Ref. Range 10/21/2015 05:38 10/21/2015 11:27 10/21/2015 16:22 10/21/2015 22:35 10/22/2015 06:20  Glucose-Capillary Latest Ref Range: 65 - 99 mg/dL 213 (H) 240 (H) 201 (H) 189 (H) 221 (H)   Diabetes history: Type 2 diabetes Outpatient Diabetes medications: NPH 22 units bid, Humalog quick pen 8 units tid with meals Current orders for Inpatient glycemic control:  NPH 15 units bid, Novolog sensitive tid with meals,   Inpatient Diabetes Program Recommendations:   Please consider increasing NPH to 18 units bid and add Novolog meal coverage 4 units tid with meals (Hold if patient eats less than 50%).  Thanks, Adah Perl, RN, BC-ADM Inpatient Diabetes Coordinator Pager [phone] (8a-5p)

## 2015-10-22 NOTE — Progress Notes (Signed)
  Echocardiogram 2D Echocardiogram with Definity has been performed.  [name] M 10/22/2015, 12:01 PM

## 2015-10-22 NOTE — Evaluation (Signed)
Occupational Therapy Evaluation Patient Details Name: [name] MRN: [id number] DOB: 11-04-1940 Today's Date: 10/22/2015    History of Present Illness [name] is a 75 y.o. male with medical history significant of sCHF, hypertension, PVD, diabetes mellitus, hyperlipidemia, COPD, GERD, depression, anxiety, CAD, s/p of CABG, PE on Xarelto, stroke, BPH, who presents with shortness of breath.  Admitted with Acute on chronic respiratory failure with hypoxia   Clinical Impression   Pt admitted with above.  He demonstrates the below listed deficits.  He requires mod - total A for ADLs and max A for functional mobility.  He has been receiving therapies at SNF with plans to return there at discharge.  All further OT needs can be met at [facility].  Acute OT will sign off at this time.     Follow Up Recommendations  SNF    Equipment Recommendations  None recommended by OT    Recommendations for Other Services       Precautions / Restrictions Precautions Precautions: Fall Required Braces or Orthoses: Other Brace/Splint Other Brace/Splint: PRAFO BIL; bil UNA boots due to chronic wounds       Mobility Bed Mobility Overal bed mobility: Needs Assistance Bed Mobility: Sidelying to Sit   Sidelying to sit: Mod assist;HOB elevated       General bed mobility comments: required the [facility] to be elevated to assist pt with moving into sitting.  Unable to lift trunk with max A from flat bed   Transfers Overall transfer level: Needs assistance Equipment used: Ambulation equipment used Transfers: Sit to/from Stand Sit to Stand: Max assist         General transfer comment: Pt moved sit to stand x 4 using stedy and max A.  As pt fatigues, he is unable to move into standing position, but after a rest break, was able to do so with max a     Balance Overall balance assessment: Needs assistance Sitting-balance support: Feet supported Sitting balance-[name]: Fair     Standing balance  support: Bilateral upper extremity supported;During functional activity Standing balance-[name]: Poor Standing balance comment: requires bil. UE support and min A                             ADL Overall ADL's : Needs assistance/impaired Eating/Feeding: Independent   Grooming: Wash/dry hands;Wash/dry face;Oral care;Set up;Sitting   Upper Body Bathing: Minimal assitance;Sitting   Lower Body Bathing: Moderate assistance;Sit to/from stand   Upper Body Dressing : Moderate assistance;Sitting   Lower Body Dressing: Total assistance;Sit to/from stand   Toilet Transfer: +2 for safety/equipment;Stand-pivot;BSC;Maximal assistance (using [employer] )   Toileting- Water quality scientist and Hygiene: Total assistance;Sit to/from stand Toileting - Clothing Manipulation Details (indicate cue type and reason): Pt required total A for peri care      Functional mobility during ADLs: Maximal assistance;+2 for safety/equipment       Vision     Perception     Praxis      Pertinent Vitals/Pain Pain Assessment: Faces Faces Pain Scale: Hurts even more Pain Location: Rt UE when touched and peri area during peri care  Pain Descriptors / Indicators: Aching;Sharp;Grimacing Pain Intervention(s): Monitored during session     Hand Dominance Right   Extremity/Trunk Assessment Upper Extremity Assessment Upper Extremity Assessment: RUE deficits/detail;Generalized weakness RUE Deficits / Details: Pt reports long standing Rt shoulder limitations and pain.  Pt is hyepersensitive to even the lightest touch and reports it has  been like that for at least 4 years.  AROM to ~70* shoulder flexion.  Elbow distally [facility]    Lower Extremity Assessment Lower Extremity Assessment: Defer to PT evaluation   Cervical / Trunk Assessment Cervical / Trunk Assessment: Normal   Communication Communication Communication: No difficulties   Cognition Arousal/Alertness: Awake/alert Behavior During Therapy:  WFL for tasks assessed/performed Overall Cognitive Status: Within Functional Limits for tasks assessed                     General Comments       Exercises       Shoulder Instructions      Home Living Family/patient expects to be discharged to:: Skilled nursing facility                                        Prior Functioning/Environment Level of Independence: Needs assistance  Gait / Transfers Assistance Needed: Wife reports he has been working with rehab at [facility] and is able to take several small steps at this time. ADL's / Homemaking Assistance Needed: Dependent with ADLs. Communication / Swallowing Assistance Needed: HOH      OT Diagnosis: Generalized weakness;Acute pain   OT Problem List: Decreased strength;Decreased range of motion;Decreased activity tolerance;Impaired balance (sitting and/or standing);Decreased knowledge of use of DME or AE;Cardiopulmonary status limiting activity;Obesity;Impaired UE functional use;Pain   OT Treatment/Interventions:      OT Goals(Current goals can be found in the care plan section) Acute Rehab OT Goals Patient Stated Goal: Get stronger OT Goal Formulation: With patient/family Time For Goal Achievement: 11/05/15 Potential to Achieve Goals: Good  OT Frequency:     Barriers to D/C:            Co-evaluation              End of Session Equipment Utilized During Treatment: Oxygen Nurse Communication: Mobility status;Need for lift equipment  Activity Tolerance: Patient tolerated treatment well Patient left: in chair;with call bell/phone within reach;with chair alarm set;with family/visitor present   Time: 2774-1287 OT Time Calculation (min): 43 min Charges:  OT General Charges $OT Visit: 1 Procedure OT Evaluation $OT Eval Moderate Complexity: 1 Procedure OT Treatments $Self Care/Home Management : 23-37 mins G-Codes:    [name] October 28, 2015, 1:38 PM

## 2015-10-22 NOTE — Clinical Social Work Note (Signed)
CSW called and left voicemail for patient's wife regarding discharge plans. Patient is from [facility].  [place] Scrape, [place]

## 2015-10-22 NOTE — NC FL2 (Signed)
[place] MEDICAID FL2 LEVEL OF CARE SCREENING TOOL     IDENTIFICATION  Patient Name: [name] Birthdate: 01-25-1941 Sex: male Admission Date (Current Location): 10/19/2015  [facility] [place] Number:  Herbalist and Address:  [facility] [place] [place] [address] [place], [place] [address]      Provider Number: [id number]  Attending Physician Name and Address:  Cristal Ford, DO  Relative Name and Phone Number:       Current Level of Care: Hospital Recommended Level of Care: [place] Prior Approval Number:    Date Approved/Denied:   PASRR Number: [id number]  Discharge Plan: SNF    Current Diagnoses: Patient Active Problem List   Diagnosis Date Noted  . Acute on chronic congestive heart failure ([place])   . Acute on chronic systolic (congestive) heart failure ([place]) 10/19/2015  . Acute on chronic respiratory failure with hypoxia ([place]) 10/19/2015  . Microcytic anemia 10/19/2015  . Atherosclerosis of native arteries of the extremities with ulceration ([place]) 08/03/2015  . Low back pain with sciatica 07/30/2015  . Chronic pulmonary embolism ([place]) 06/18/2015  . Diabetic peripheral neuropathy associated with type 2 diabetes mellitus ([place]) 06/18/2015  . Allergic rhinitis 05/22/2015  . Cellulitis of left lower leg 05/22/2015  . Venous ulcer of left leg ([place]) 05/22/2015  . Constipation due to opioid therapy 05/06/2015  . Vitamin D deficiency 05/03/2015  . Type II diabetes mellitus with neurological manifestations ([place]) 02/15/2015  . Dyslipidemia associated with type 2 diabetes mellitus ([place]) 02/15/2015  . Bilateral edema of lower extremity   . Dehydration 07/18/2014  . Diarrhea 07/18/2014  . Benign paroxysmal positional vertigo 05/26/2014  . OA (osteoarthritis) 05/12/2014  . Unsteady gait 04/12/2014  . Orthostatic hypotension 05/16/2013  . Generalized weakness 04/30/2013  . At high risk for falls 07/19/2012  . Falls  02/17/2012  . Bradycardia 01/22/2012  . Hemorrhoid 01/12/2012  . GERD (gastroesophageal reflux disease) 11/10/2011  . Leg edema 09/20/2011  . Peripheral vascular disease ([place]) 04/16/2011  . COPD exacerbation ([place]) 02/27/2011  . Stasis dermatitis 12/04/2010  . BPH (benign prostatic hyperplasia) 11/01/2010  . Long term current use of anticoagulant therapy 05/03/2010  . FROZEN RIGHT SHOULDER 02/25/2010  . Depression 07/08/2006  . Essential hypertension 07/08/2006  . HYPERCHOLESTEROLEMIA 05/21/2006  . Anxiety state 05/21/2006  . Coronary atherosclerosis 05/21/2006  . Chronic systolic heart failure ([place]) 05/21/2006  . APNEA, SLEEP 05/21/2006    Orientation RESPIRATION BLADDER Height & Weight     Self, Place  O2 (Nasal Canula 2 L) Continent, External catheter Weight: 282 lb (127.9 kg) (bed scale; new bari bed) Height:     BEHAVIORAL SYMPTOMS/MOOD NEUROLOGICAL BOWEL NUTRITION STATUS   (None)  (None) Continent Diet (renal/carb modified with fluid restriction Diet-HS Snack? Nothing; Room service appropriate? Yes; Fluid consistency: Thin; Fluid restriction: 1800 mL Fluid )  AMBULATORY STATUS COMMUNICATION OF NEEDS Skin   Limited Assist Verbally PU Stage and Appropriate Care PU Stage 1 Dressing:  (No Stage listed. Foam daily. Sacrum.)                     Personal Care Assistance Level of Assistance  Bathing, Feeding, Dressing Bathing Assistance: Limited assistance Feeding assistance: Independent Dressing Assistance: Limited assistance     Functional Limitations Info  Sight, Hearing, Speech Sight Info: Adequate Hearing Info: Adequate Speech Info: Adequate    SPECIAL CARE FACTORS FREQUENCY  PT (By licensed PT), Blood pressure, Diabetic urine testing  PT Frequency: 5 x week              Contractures Contractures Info: Not present    Additional Factors Info  Code Status, Allergies, Psychotropic Code Status Info: DNR Allergies Info: Vancomycin,  Diazepam Psychotropic Info: Anxiety, Depression: Cymbalta DR 90 mg PO daily, Ativan 0.25 mg PO QHS, Trazodone 100 mg PO QHS, Ambien 5 mg PO QHS prn.         Current Medications (10/22/2015):  This is the current hospital active medication list Current Facility-Administered Medications  Medication Dose Route Frequency Provider Last Rate Last Dose  . PERFLUTREN LIPID MICROSPHERE injection SUSP           . 0.9 %  sodium chloride infusion  250 mL Intravenous PRN Ivor Costa, MD      . acetaminophen (TYLENOL) tablet 650 mg  650 mg Oral Q4H PRN Ivor Costa, MD      . albuterol (PROVENTIL) (2.5 MG/3ML) 0.083% nebulizer solution 2.5 mg  2.5 mg Nebulization Q2H PRN Ivor Costa, MD      . atorvastatin (LIPITOR) tablet 40 mg  40 mg Oral q1800 Ivor Costa, MD   40 mg at 10/21/15 1800  . azithromycin (ZITHROMAX) tablet 250 mg  250 mg Oral Daily Ivor Costa, MD   250 mg at 10/22/15 1029  . benzonatate (TESSALON) capsule 100 mg  100 mg Oral TID Maryann Mikhail, DO   100 mg at 10/22/15 1029  . bisoprolol (ZEBETA) tablet 2.5 mg  2.5 mg Oral Daily Ivor Costa, MD   2.5 mg at 10/22/15 1030  . collagenase (SANTYL) ointment 1 application  1 application Topical Daily Ivor Costa, MD   1 application at [id number] 1028  . dextromethorphan-guaiFENesin (MUCINEX DM) 30-600 MG per 12 hr tablet 1 tablet  1 tablet Oral BID Ivor Costa, MD   1 tablet at 10/22/15 1029  . docusate sodium (COLACE) capsule 100 mg  100 mg Oral BID Ivor Costa, MD   100 mg at 10/22/15 1030  . DULoxetine (CYMBALTA) DR capsule 90 mg  90 mg Oral Daily Ivor Costa, MD   90 mg at 10/22/15 1029  . feeding supplement (ENSURE ENLIVE) (ENSURE ENLIVE) liquid 237 mL  237 mL Oral Q24H Maryann Mikhail, DO   237 mL at 10/21/15 1230  . feeding supplement (PRO-STAT SUGAR FREE 64) liquid 30 mL  30 mL Oral BID Maryann Mikhail, DO   30 mL at 10/22/15 1030  . furosemide (LASIX) injection 40 mg  40 mg Intravenous TID Ivor Costa, MD   40 mg at 10/22/15 1030  . gabapentin (NEURONTIN) capsule  400 mg  400 mg Oral TID Ivor Costa, MD   400 mg at 10/22/15 1029  . HYDROcodone-acetaminophen (NORCO) 7.5-325 MG per tablet 1 tablet  1 tablet Oral Q6H PRN Ivor Costa, MD   1 tablet at 10/22/15 0349  . insulin aspart (novoLOG) injection 0-9 Units  0-9 Units Subcutaneous TID WC Ivor Costa, MD   3 Units at 10/22/15 [phone]  . insulin NPH Human (HUMULIN N,NOVOLIN N) injection 15 Units  15 Units Subcutaneous BID AC & HS Ivor Costa, MD   15 Units at 10/22/15 [phone]  . ipratropium-albuterol (DUONEB) 0.5-2.5 (3) MG/3ML nebulizer solution 3 mL  3 mL Nebulization TID Ivor Costa, MD   3 mL at 10/22/15 0853  . lidocaine (LIDODERM) 5 % 1 patch  1 patch Transdermal Q24H Ivor Costa, MD   1 patch at 10/22/15 1031  . lisinopril (PRINIVIL,ZESTRIL) tablet 2.5 mg  2.5  mg Oral Daily Ivor Costa, MD   2.5 mg at 10/22/15 1029  . LORazepam (ATIVAN) tablet 0.25 mg  0.25 mg Oral QHS Ivor Costa, MD   0.25 mg at 10/21/15 2138  . lubiprostone (AMITIZA) capsule 8 mcg  8 mcg Oral BID WC Ivor Costa, MD   8 mcg at 10/22/15 0858  . meclizine (ANTIVERT) tablet 25 mg  25 mg Oral TID PRN Ivor Costa, MD      . methocarbamol (ROBAXIN) tablet 500 mg  500 mg Oral Q6H PRN Ivor Costa, MD      . methylPREDNISolone sodium succinate (SOLU-MEDROL) 125 mg/2 mL injection 60 mg  60 mg Intravenous Q12H Ivor Costa, MD   60 mg at 10/22/15 1030  . multivitamin (PROSIGHT) tablet 1 tablet  1 tablet Oral Daily Ivor Costa, MD   1 tablet at 10/22/15 1029  . naloxegol oxalate (MOVANTIK) tablet 25 mg  25 mg Oral Daily Ivor Costa, MD   25 mg at 10/22/15 1032  . naproxen (NAPROSYN) tablet 500 mg  500 mg Oral BID WC Ivor Costa, MD   500 mg at 10/22/15 0858  . nitroGLYCERIN (NITROSTAT) SL tablet 0.4 mg  0.4 mg Sublingual PRN Ivor Costa, MD      . ondansetron [facility]) injection 4 mg  4 mg Intravenous Q6H PRN Ivor Costa, MD      . pantoprazole (PROTONIX) EC tablet 40 mg  40 mg Oral Daily Ivor Costa, MD   40 mg at 10/22/15 1029  . perflutren lipid microspheres (DEFINITY) IV suspension   1-10 mL Intravenous PRN Ivor Costa, MD      . polyvinyl alcohol (LIQUIFILM TEARS) 1.4 % ophthalmic solution 1 drop  1 drop Both Eyes BID Ivor Costa, MD   1 drop at 10/22/15 1031  . rivaroxaban (XARELTO) tablet 20 mg  20 mg Oral Q supper Haze Boyden, RPH   20 mg at 10/21/15 1700  . sodium chloride flush (NS) 0.9 % injection 3 mL  3 mL Intravenous Q12H Ivor Costa, MD   3 mL at 10/22/15 1031  . sodium chloride flush (NS) 0.9 % injection 3 mL  3 mL Intravenous PRN Ivor Costa, MD      . tamsulosin (FLOMAX) capsule 0.4 mg  0.4 mg Oral QPC supper Ivor Costa, MD   0.4 mg at 10/21/15 1800  . traZODone (DESYREL) tablet 100 mg  100 mg Oral QHS Ivor Costa, MD   100 mg at 10/21/15 2139  . [START ON 10/24/2015] Vitamin D (Ergocalciferol) (DRISDOL) capsule 50,000 Units  50,000 Units Oral Q Wed Ivor Costa, MD      . zolpidem (AMBIEN) tablet 5 mg  5 mg Oral QHS PRN Ivor Costa, MD         Discharge Medications: Please see discharge summary for a list of discharge medications.  Relevant Imaging Results:  Relevant Lab Results:   Additional Information SS#: [id number]  Candie Chroman, LCSW

## 2015-10-23 ENCOUNTER — Encounter (HOSPITAL_BASED_OUTPATIENT_CLINIC_OR_DEPARTMENT_OTHER): Payer: Medicare Other

## 2015-10-23 ENCOUNTER — Other Ambulatory Visit (HOSPITAL_COMMUNITY): Payer: Medicare Other

## 2015-10-23 ENCOUNTER — Encounter (HOSPITAL_COMMUNITY): Payer: Self-pay | Admitting: Cardiology

## 2015-10-23 DIAGNOSIS — I2510 Atherosclerotic heart disease of native coronary artery without angina pectoris: Secondary | ICD-10-CM

## 2015-10-23 DIAGNOSIS — I5043 Acute on chronic combined systolic (congestive) and diastolic (congestive) heart failure: Secondary | ICD-10-CM

## 2015-10-23 DIAGNOSIS — I255 Ischemic cardiomyopathy: Secondary | ICD-10-CM

## 2015-10-23 LAB — CBC
HCT: 31.5 % — ABNORMAL LOW (ref 39.0–52.0)
HEMOGLOBIN: 8.8 g/dL — AB (ref 13.0–17.0)
MCH: 19.6 pg — ABNORMAL LOW (ref 26.0–34.0)
MCHC: 27.9 g/dL — ABNORMAL LOW (ref 30.0–36.0)
MCV: 70 fL — ABNORMAL LOW (ref 78.0–100.0)
PLATELETS: 285 10*3/uL (ref 150–400)
RBC: 4.5 MIL/uL (ref 4.22–5.81)
RDW: 17.9 % — ABNORMAL HIGH (ref 11.5–15.5)
WBC: 12 10*3/uL — AB (ref 4.0–10.5)

## 2015-10-23 LAB — CULTURE, BLOOD (ROUTINE X 2)

## 2015-10-23 LAB — BASIC METABOLIC PANEL
ANION GAP: 11 (ref 5–15)
BUN: 34 mg/dL — ABNORMAL HIGH (ref 6–20)
CALCIUM: 8.5 mg/dL — AB (ref 8.9–10.3)
CO2: 26 mmol/L (ref 22–32)
CREATININE: 1.06 mg/dL (ref 0.61–1.24)
Chloride: 96 mmol/L — ABNORMAL LOW (ref 101–111)
GFR calc non Af Amer: >60 mL/min (ref >60)
Glucose, Bld: 261 mg/dL — ABNORMAL HIGH (ref 65–99)
Potassium: 4.7 mmol/L (ref 3.5–5.1)
SODIUM: 133 mmol/L — AB (ref 135–145)

## 2015-10-23 LAB — GLUCOSE, CAPILLARY
GLUCOSE-CAPILLARY: 278 mg/dL — AB (ref 65–99)
GLUCOSE-CAPILLARY: 198 mg/dL — AB (ref 65–99)
Glucose-Capillary: 273 mg/dL — ABNORMAL HIGH (ref 65–99)
Glucose-Capillary: 191 mg/dL — ABNORMAL HIGH (ref 65–99)

## 2015-10-23 MED ORDER — INSULIN NPH (HUMAN) (ISOPHANE) 100 UNIT/ML ~~LOC~~ SUSP
18.0000 [IU] | Freq: Two times a day (BID) | SUBCUTANEOUS | Status: DC
  Administered 2015-10-23 – 2015-10-24 (×2): 18 [IU] via SUBCUTANEOUS
  Filled 2015-10-23: qty 10

## 2015-10-23 MED ORDER — CARVEDILOL 3.125 MG PO TABS
3.1250 mg | ORAL_TABLET | Freq: Two times a day (BID) | ORAL | Status: DC
  Administered 2015-10-23 – 2015-10-29 (×12): 3.125 mg via ORAL
  Filled 2015-10-23 (×12): qty 1

## 2015-10-23 NOTE — Progress Notes (Addendum)
PROGRESS NOTE    [name]  [id number] DOB: 05-Jun-1940 DOA: 10/19/2015 PCP: [name], MD   Brief Narrative:  HPI on 10/19/2015 by Dr. Leory Plowman is a 75 y.o. male with medical history significant of sCHF with EF 40-45%, hypertension, PVD, diabetes mellitus, hyperlipidemia, COPD, GERD, depression, anxiety, CAD, s/p of CABG, PE on Xarelto, stroke, BPH, who presents with shortness of breath.   Patient reports that he has been having shortness of breath for nearly a month, which has been progressively getting worse. He also has cough with greenish colored sputum production. He has mild intermittent chest pain. It is pleuritic and is aggravated by coughing. No fever or chills. Patient denies nausea, vomiting, abdominal pain, diarrhea. He states that he has mild dysuria sometimes, but no burning or increased urinary frequency. Patient does not have unilateral weakness, rashes. He denies any rectal bleeding, dark stool or melena.   Assessment & Plan   Acute on chronic respiratory failure with hypoxia -Most likely due to combination of COPD and CHF exacerbation. -Treatment and plan below -Will ask RN to monitor O2 sats  Acute on chronic systolic  Heart failure -Echocardiogram 07/19/14 showed EF 40-45% -BNP 446, +JVD, LE edema upon admission -CXR mild pulm vascular congestion -Contiue IV lasix 40mg  TID -Monitor intake/output, daily weights. Output over past 24hours 1500cc -Echocardiogram EF [id number], garde 2 diastolic dysfunction -Continue lisinopril and bisoprolol -Will speak to cardiology regarding any further workup/medical management given his 10% change  COPD exacerbation  -Cough has been ongoing for 4 weeks per patient.  -Upon admission, patient had bilateral wheezing on auscultation -Nebulizers: scheduled Duoneb and prn albuterol nebs -Continue Solu-Medrol, azithromycin, mucinex -Blood cultures from 10/20/2015 show 1/2 GPC-SCN -Respiratory viral panel  unremarkable   ?Bacteremia vs contamination -Blood cultures from 10/20/2015 show 1/2 GPC-SCN (likely contamination) -Repeat Blood cultures from 7/30 show no growth to date  Microcytic anemia -Basline hemoglobin 12-13 (waas 12.3 on 07/26/15) -Currently Hemoglobin 8.8 (was 8.7 upon admission) -INR 1.39 -FOBT negative -Anemia panel: Iron 12, ferritin 38, B12 301 -Was given dose of ferrahem and B12 -Continue to monitor CBC closely  History of PE -CTA chest neg for PE   -Monitor respiratory status closely -Restarted xarelto  Hyperlipidemia -Last LDL was 58 on 10/19/10 -Continue statin  Depression and anxiety -Continue Cymbalta and Ativan  GERD -Continue Protonix  Essential hypertension -Continue Zebeta and lisinopril (with holding parameters)  BPH -Continue Flomax  Diabetes Mellitus, type II -Last A1c 7.4 -Continue NPH, ISS, CBG monitoring  PAD and chronic leg wound -Patient has been followed with [facility]. He was seen on 10/18/16 by NP, Barnabas Lister. Per clinic note, wound care center is now managing and evaluating the wounds in his lower legs; he has unna boots on both lower legs. Pt refused to allow the ultrasonographer to removed the unna boot compression dressings in his lower legs in order to perform ABI's. Per Dr. Bridgett Larsson, Pt is not a candidate for any further surgical interventions, need to focus on wound care in this patient primarily. -Wound care consulted  Upper extremity edema -Per patient, he did have an ultrasound of his arms done prior to admission.  -Only carotid doppler done: 1-39% stenosis of RICA, patent left carotid endarterectomy, no Left ICA stenosis -Will order upper ext doppler to rule out DVT as patient was off of Xarelto for a small period of time. -Keep arms elevated.  DVT Prophylaxis  Xarelto  Code Status: DNR  Family  Communication: None at bedside  Disposition Plan: Admitted. Continue current  treatment. Possible discharge to SNF in 24-48 hours  Consultants Cardiology  Procedures  Echocardiogram  Antibiotics   Anti-infectives    Start     Dose/Rate Route Frequency Ordered Stop   10/21/15 2000  vancomycin (VANCOCIN) IVPB 1000 mg/200 mL premix  Status:  Discontinued     1,000 mg 100 mL/hr over 120 Minutes Intravenous Every 12 hours 10/21/15 0730 10/21/15 1146   10/21/15 1000  azithromycin (ZITHROMAX) tablet 250 mg     250 mg Oral Daily 10/20/15 0013 10/25/15 0959   10/21/15 0745  vancomycin (VANCOCIN) 2,000 mg in sodium chloride 0.9 % 500 mL IVPB     2,000 mg 125 mL/hr over 240 Minutes Intravenous  Once 10/21/15 0730 10/21/15 1329   10/21/15 0700  ceFAZolin (ANCEF) IVPB 2g/100 mL premix     2 g 200 mL/hr over 30 Minutes Intravenous  Once 10/21/15 0621 10/21/15 0730   10/20/15 0015  azithromycin (ZITHROMAX) tablet 500 mg     500 mg Oral  Once 10/20/15 0013 10/20/15 0202      Subjective:   [name] seen and examined today. Patient feels his breathing has improved, however, not back to baseline. Continues to have dry cough occassionally. Continues to complain of arm swelling. Denies chest pain, abdominal pain, nausea/vomiting, diarrhea,constipation, dizziness, headache.    Objective:   Vitals:   10/22/15 1337 10/22/15 2015 10/22/15 2044 10/23/15 0337  BP:  (!) 90/38  (!) 102/52  Pulse:  78 78 75  Resp:  19 19 17   Temp:  98.3 F (36.8 C)  98.1 F (36.7 C)  TempSrc:  Oral  Oral  SpO2: 94% 97% 98% 98%  Weight:    128.4 kg (283 lb)    Intake/Output Summary (Last 24 hours) at 10/23/15 1015 Last data filed at 10/23/15 0911  Gross per 24 hour  Intake              960 ml  Output             1500 ml  Net             -540 ml   Filed Weights   10/21/15 0352 10/22/15 0345 10/23/15 0337  Weight: 127.1 kg (280 lb 4.8 oz) 127.9 kg (282 lb) 128.4 kg (283 lb)    Exam  General: Well developed, well nourished, chronically ill, NAD  HEENT: NCAT, mucous membranes  moist.   Cardiovascular: S1 S2 auscultated, no murmurs, RRR  Respiratory: Diminished but clear, with equal chest rise  Abdomen: Soft, nontender, nondistended, + bowel sounds  Extremities: warm dry without cyanosis clubbing. LE edema. 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 Upper extremity edema, R>L  Neuro: AAOx3, nonfocal  Skin: UNNA boots in place on LE  Psych: Normal affect and demeanor with intact judgement and insight, pleasant   Data Reviewed: I have personally reviewed following labs and imaging studies  CBC:  Recent Labs Lab 10/19/15 1833  10/21/15 0018 10/21/15 0708 10/21/15 1006 10/22/15 0810 10/23/15 0442  WBC 13.8*  < > 13.4* 13.7* 15.3* 13.2* 12.0*  NEUTROABS 11.2*  --   --   --   --   --   --   HGB 8.7*  < > 8.1* 8.2* 9.0* 8.9* 8.8*  HCT 31.0*  < > 28.1* 28.2* 32.4* 32.0* 31.5*  MCV 70.9*  < > 69.4* 68.8* 70.7* 71.0* 70.0*  PLT 336  < > 293 299 298 282 285  < > =  values in this interval not displayed. Basic Metabolic Panel:  Recent Labs Lab 10/19/15 1833 10/21/15 0708 10/22/15 0317 10/23/15 0442  NA 134* 134* 136 133*  K 4.8 4.9 4.5 4.7  CL 98* 98* 98* 96*  CO2 29 26 28 26   GLUCOSE 123* 213* 221* 261*  BUN 15 27* 31* 34*  CREATININE 0.93 1.10 1.16 1.06  CALCIUM 8.4* 8.3* 8.5* 8.5*   GFR: Estimated Creatinine Clearance: 77.5 mL/min (by C-G formula based on SCr of 1.06 mg/dL). Liver Function Tests: No results for input(s): AST, ALT, ALKPHOS, BILITOT, PROT, ALBUMIN in the last 168 hours. No results for input(s): LIPASE, AMYLASE in the last 168 hours. No results for input(s): AMMONIA in the last 168 hours. Coagulation Profile:  Recent Labs Lab 10/19/15 1833  INR 1.39   Cardiac Enzymes: No results for input(s): CKTOTAL, CKMB, CKMBINDEX, TROPONINI in the last 168 hours. BNP (last 3 results) No results for input(s): PROBNP in the last 8760 hours. HbA1C: No results for input(s): HGBA1C in the last 72 hours. CBG:  Recent Labs Lab 10/22/15 0620  10/22/15 1158 10/22/15 1627 10/22/15 2118 10/23/15 0611  GLUCAP 221* 209* 279* 267* 273*   Lipid Profile: No results for input(s): CHOL, HDL, LDLCALC, TRIG, CHOLHDL, LDLDIRECT in the last 72 hours. Thyroid Function Tests: No results for input(s): TSH, T4TOTAL, FREET4, T3FREE, THYROIDAB in the last 72 hours. Anemia Panel:  Recent Labs  10/21/15 0018  VITAMINB12 301  FOLATE 17.4  FERRITIN 38  TIBC 349  IRON 12*  RETICCTPCT 2.0   Urine analysis:    Component Value Date/Time   COLORURINE YELLOW 10/20/2015 [place] 10/20/2015 1655   LABSPEC 1.018 10/20/2015 1655   PHURINE 6.0 10/20/2015 1655   GLUCOSEU NEGATIVE 10/20/2015 1655   HGBUR NEGATIVE 10/20/2015 1655   HGBUR negative 03/06/2010 1419   BILIRUBINUR NEGATIVE 10/20/2015 1655   KETONESUR NEGATIVE 10/20/2015 1655   PROTEINUR NEGATIVE 10/20/2015 1655   UROBILINOGEN 0.2 09/13/2014 0945   NITRITE NEGATIVE 10/20/2015 1655   LEUKOCYTESUR NEGATIVE 10/20/2015 1655   Sepsis Labs: @LABRCNTIP (procalcitonin:4,lacticidven:4)  ) Recent Results (from the past 240 hour(s))  Respiratory Panel by PCR     Status: None   Collection Time: 10/20/15  2:12 AM  Result Value Ref Range Status   Adenovirus NOT DETECTED NOT DETECTED Final   Coronavirus 229E NOT DETECTED NOT DETECTED Final   Coronavirus HKU1 NOT DETECTED NOT DETECTED Final   Coronavirus NL63 NOT DETECTED NOT DETECTED Final   Coronavirus OC43 NOT DETECTED NOT DETECTED Final   Metapneumovirus NOT DETECTED NOT DETECTED Final   Rhinovirus / Enterovirus NOT DETECTED NOT DETECTED Final   Influenza A NOT DETECTED NOT DETECTED Final   Influenza A H1 NOT DETECTED NOT DETECTED Final   Influenza A H1 2009 NOT DETECTED NOT DETECTED Final   Influenza A H3 NOT DETECTED NOT DETECTED Final   Influenza B NOT DETECTED NOT DETECTED Final   Parainfluenza Virus 1 NOT DETECTED NOT DETECTED Final   Parainfluenza Virus 2 NOT DETECTED NOT DETECTED Final   Parainfluenza Virus 3  NOT DETECTED NOT DETECTED Final   Parainfluenza Virus 4 NOT DETECTED NOT DETECTED Final   Respiratory Syncytial Virus NOT DETECTED NOT DETECTED Final   Bordetella pertussis NOT DETECTED NOT DETECTED Final   Chlamydophila pneumoniae NOT DETECTED NOT DETECTED Final   Mycoplasma pneumoniae NOT DETECTED NOT DETECTED Final  Culture, blood (Routine X 2) w Reflex to ID Panel     Status: Abnormal   Collection Time:  10/20/15  6:15 AM  Result Value Ref Range Status   Specimen Description BLOOD RIGHT HAND  Final   Special Requests IN PEDIATRIC BOTTLE 2ML  Final   Culture  Setup Time   Final    GRAM POSITIVE COCCI IN CLUSTERS IN PEDIATRIC BOTTLE CRITICAL RESULT CALLED TO, READ BACK BY AND VERIFIED WITH: TO KAMEND(PHARD) BY TCLEVELAND 10/21/2015 AT 5:44AM    Culture (A)  Final    STAPHYLOCOCCUS SPECIES (COAGULASE NEGATIVE) THE SIGNIFICANCE OF ISOLATING THIS ORGANISM FROM A SINGLE SET OF BLOOD CULTURES WHEN MULTIPLE SETS ARE DRAWN IS UNCERTAIN. PLEASE NOTIFY THE MICROBIOLOGY DEPARTMENT WITHIN ONE WEEK IF SPECIATION AND SENSITIVITIES ARE REQUIRED.    Report Status 10/23/2015 FINAL  Final  Blood Culture ID Panel (Reflexed)     Status: Abnormal   Collection Time: 10/20/15  6:15 AM  Result Value Ref Range Status   Enterococcus species NOT DETECTED NOT DETECTED Final   Vancomycin resistance NOT DETECTED NOT DETECTED Final   Listeria monocytogenes NOT DETECTED NOT DETECTED Final   Staphylococcus species DETECTED (A) NOT DETECTED Final    Comment: CRITICAL RESULT CALLED TO, READ BACK BY AND VERIFIED WITH: TO KAMENED(PHARD) BY TCLEVELAND 10/21/2015 AT 5:44AM    Staphylococcus aureus NOT DETECTED NOT DETECTED Final   Methicillin resistance NOT DETECTED NOT DETECTED Final   Streptococcus species NOT DETECTED NOT DETECTED Final   Streptococcus agalactiae NOT DETECTED NOT DETECTED Final   Streptococcus pneumoniae NOT DETECTED NOT DETECTED Final   Streptococcus pyogenes NOT DETECTED NOT DETECTED Final    Acinetobacter baumannii NOT DETECTED NOT DETECTED Final   Enterobacteriaceae species NOT DETECTED NOT DETECTED Final   Enterobacter cloacae complex NOT DETECTED NOT DETECTED Final   Escherichia coli NOT DETECTED NOT DETECTED Final   Klebsiella oxytoca NOT DETECTED NOT DETECTED Final   Klebsiella pneumoniae NOT DETECTED NOT DETECTED Final   Proteus species NOT DETECTED NOT DETECTED Final   Serratia marcescens NOT DETECTED NOT DETECTED Final   Carbapenem resistance NOT DETECTED NOT DETECTED Final   Haemophilus influenzae NOT DETECTED NOT DETECTED Final   Neisseria meningitidis NOT DETECTED NOT DETECTED Final   Pseudomonas aeruginosa NOT DETECTED NOT DETECTED Final   Candida albicans NOT DETECTED NOT DETECTED Final   Candida glabrata NOT DETECTED NOT DETECTED Final   Candida krusei NOT DETECTED NOT DETECTED Final   Candida parapsilosis NOT DETECTED NOT DETECTED Final   Candida tropicalis NOT DETECTED NOT DETECTED Final  Culture, blood (Routine X 2) w Reflex to ID Panel     Status: None (Preliminary result)   Collection Time: 10/20/15  6:18 AM  Result Value Ref Range Status   Specimen Description BLOOD RIGHT ARM  Final   Special Requests IN PEDIATRIC BOTTLE 1ML  Final   Culture NO GROWTH 2 DAYS  Final   Report Status PENDING  Incomplete  MRSA PCR Screening     Status: None   Collection Time: 10/20/15  8:44 AM  Result Value Ref Range Status   MRSA by PCR NEGATIVE NEGATIVE Final    Comment:        The GeneXpert MRSA Assay (FDA approved for NASAL specimens only), is one component of a comprehensive MRSA colonization surveillance program. It is not intended to diagnose MRSA infection nor to guide or monitor treatment for MRSA infections.   Culture, blood (routine x 2)     Status: None (Preliminary result)   Collection Time: 10/21/15 10:17 AM  Result Value Ref Range Status   Specimen Description BLOOD RIGHT HAND  Final   Special Requests IN PEDIATRIC BOTTLE .5CC  Final   Culture NO  GROWTH 1 DAY  Final   Report Status PENDING  Incomplete  Culture, blood (routine x 2)     Status: None (Preliminary result)   Collection Time: 10/21/15 10:24 AM  Result Value Ref Range Status   Specimen Description BLOOD RIGHT HAND  Final   Special Requests IN PEDIATRIC BOTTLE  .5CC  Final   Culture NO GROWTH 1 DAY  Final   Report Status PENDING  Incomplete      Radiology Studies: No results found.   Scheduled Meds: . atorvastatin  40 mg Oral q1800  . azithromycin  250 mg Oral Daily  . benzonatate  100 mg Oral TID  . bisoprolol  2.5 mg Oral Daily  . collagenase  1 application Topical Daily  . dextromethorphan-guaiFENesin  1 tablet Oral BID  . docusate sodium  100 mg Oral BID  . DULoxetine  90 mg Oral Daily  . feeding supplement (ENSURE ENLIVE)  237 mL Oral Q24H  . feeding supplement (PRO-STAT SUGAR FREE 64)  30 mL Oral BID  . furosemide  40 mg Intravenous TID  . gabapentin  400 mg Oral TID  . insulin aspart  0-9 Units Subcutaneous TID WC  . insulin NPH Human  18 Units Subcutaneous BID AC & HS  . ipratropium-albuterol  3 mL Nebulization TID  . lidocaine  1 patch Transdermal Q24H  . lisinopril  2.5 mg Oral Daily  . LORazepam  0.25 mg Oral QHS  . lubiprostone  8 mcg Oral BID WC  . methylPREDNISolone (SOLU-MEDROL) injection  60 mg Intravenous Q12H  . multivitamin  1 tablet Oral Daily  . naloxegol oxalate  25 mg Oral Daily  . naproxen  500 mg Oral BID WC  . pantoprazole  40 mg Oral Daily  . polyvinyl alcohol  1 drop Both Eyes BID  . rivaroxaban  20 mg Oral Q supper  . sodium chloride flush  3 mL Intravenous Q12H  . tamsulosin  0.4 mg Oral QPC supper  . traZODone  100 mg Oral QHS  . [START ON 10/24/2015] Vitamin D (Ergocalciferol)  50,000 Units Oral Q Wed   Continuous Infusions:    LOS: 4 days   Time Spent in minutes   30 minutes  Jaydrian Corpening D.O. on 10/23/2015 at 10:15 AM  Between 7am to 7pm - Pager - [phone]  After 7pm go to [email or web address] - password  [email or web address]  And look for the night coverage person covering for me after hours  [facility] Office  [phone]

## 2015-10-23 NOTE — Consult Note (Signed)
Reason for Consult: decrease in EF  Referring Physician: Dr. Ree Kida   PCP:  [name], MD  Primary Cardiologist: Dr. Nori Riis is an 75 y.o. male.    Chief Complaint: admitted 7/28 with SOB   HPI: Asked to see 40 yoM- prior pt of Dr. Mare Ferrari but not seen by him since 2014.  He currently lives in a SNF and is mostly dependent for ADL.  He presented with SOB and Cough 10/19/15.    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 Hx of CABG, last cath 10/18/10 with STEMI and no new changes in cath.  He has severe double-vessel CAD with occluded RCA and LCX and occluded grafts to these, vessels.  Patent LAD and diagonal, with minimal disease, Moderate Lt ventricular systolic dysfunction.    He had been on long-term Coumadin because of angina decubitus and  PE as well as CVA- his coumadin was changed to xarelto due to difficulty keeping  INR stable.  In addition the patient has exogenous obesity, hypercholesterolemia, diabetes, osteoarthritis, and venous stasis dermatitis. He has adult onset diabetes mellitus. He has COPD. he no longer smokes. He has PAD with L pop aneurysm resection, L fem-pop bypass to tib, BPG with cephalic vein, LCEA, LEIA to pop vein bypass, Lt 1st ray amputation.  Now with wounds on both lower extremities-venous stasis ulcers.  On admit 10/19/15 he was diagnosed with acute on chronic resp. Failure with hypoxia, with COPD and CHF. BNP 446- He is being diuresed and COPD treated with nebs, steroids and ABX.  His Hgb down to 8 when in May 2017 it was 12.3.  xarelto held.  For his PAD Per Dr. Bridgett Larsson, "Pt is not a candidate for any further surgical interventions, need to focus on wound care in this patient primarily."  Pt is DNR.  Previous echo 06/2014 with EF 40-45%.  Mod to severe LVH.  Diastolic dysfunction.aortic sclerosis.  Now with EF 30-35%, mild LVH, difficult images  even with Definity, sevee hypokinesis of the inf and inf lat walls.  G2DD.  Mod MR. PA pressure 39 mmHg- mild pulmonary hypertension.   His tropoin is neg. EKG ST with LBBB- which has developed over last year.       Past Medical History:  Diagnosis Date  . Adhesive capsulitis   . Angina   . Anxiety   . CAD (coronary artery disease)   . CHF (congestive heart failure) ([place])   . Chronic back pain   . Chronic kidney disease    hx of BPH  . COPD (chronic obstructive pulmonary disease) ([place])   . CVA (cerebral infarction) Questionable history  . Depression   . Diabetes mellitus   . Diverticulosis   . Falls frequently 06/2014  . GERD (gastroesophageal reflux disease)   . Hyperlipidemia   . Hypertension   . 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 Myocardial infarction ([place])   . Neuromuscular disorder ([place])    HX of diabetic periferal neuropathy  . On home oxygen therapy    "1.5L prn" (04/12/2014)  . Osteomyelitis ([place]) 2012   Sternoclavicular joint   . PE (pulmonary embolism) 10-15 years ago   Lifelong Coumadin  . Peripheral vascular disease ([place])   . Shortness of breath   . Sleep apnea     Past Surgical History:  Procedure Laterality Date  .  AMPUTATION  03/16/2011   Procedure: AMPUTATION DIGIT;  Surgeon: Angelia Mould, MD;  Location: [facility] OR;  Service: Vascular;  Laterality: Left;  Great toe  . CARDIAC CATHETERIZATION  October 18, 2010   Known obstructive disease, no further blockages  . CARDIAC CATHETERIZATION  03/17/2005   EF 35-40%  . COLONOSCOPY  2006    Multiple polyps removed, repeat in 5 years  . CORONARY ARTERY BYPASS GRAFT  1992  . [place], 2002  . DOPPLER ECHOCARDIOGRAPHY  Sept 2011   EF 50-55% with some impaired diastolic relaxation  . FEMORAL-POPLITEAL BYPASS GRAFT  2008   Left  . FRACTURE SURGERY  1980s   Hip  . HIP ARTHROPLASTY Left   . Right iliopopliteal bypass - [place]  .  septic arthritis     Removal of infected CABG wire by Dr Arlyce Dice - 2011  . SPINE SURGERY  2002   Cervical fusion vertebroplasty C3-4-5  . US ECHOCARDIOGRAPHY  03/04/2006   EF 50-55%    Family History  Problem Relation Age of Onset  . Heart disease Father   . Heart disease Mother    Social History:  reports that he quit smoking about 15 years ago. He has never used smokeless tobacco. He reports that he does not drink alcohol or use drugs.  Allergies:  Allergies  Allergen Reactions  . Vancomycin Other (See Comments)    "red man syndrome"  . Diazepam Anxiety    REACTION: makes patient cry    OUTPATIENT MEDICATIONS: No current facility-administered medications on file prior to encounter.    Current Outpatient Prescriptions on File Prior to Encounter  Medication Sig Dispense Refill  . bisoprolol (ZEBETA) 5 MG tablet Take 0.5 tablets (2.5 mg total) by mouth daily. 30 tablet 0  . docusate sodium (COLACE) 100 MG capsule Take 100 mg by mouth 2 (two) times daily.    . DULoxetine (CYMBALTA) 30 MG capsule Take 90 mg by mouth daily.    Marland Kitchen gabapentin (NEURONTIN) 400 MG capsule Take 400 mg by mouth 3 (three) times daily.    Marland Kitchen HYDROcodone-acetaminophen (NORCO) 7.5-325 MG tablet Take 1 tablet by mouth every 6 (six) hours.     . insulin lispro (HUMALOG KWIKPEN) 100 UNIT/ML KiwkPen Inject 8 Units into the skin 3 (three) times daily before meals. An additional if CBG is 0-149=0 units, 150-400= 5 units before meals and at bedtime    . insulin NPH Human (HUMULIN N,NOVOLIN N) 100 UNIT/ML injection Inject 0.22 mLs (22 Units total) into the skin 2 (two) times daily before a meal. 10 mL 11  . lidocaine (LIDODERM) 5 % Place 1 patch onto the skin daily.     Marland Kitchen lisinopril (PRINIVIL,ZESTRIL) 2.5 MG tablet Take 2.5 mg by mouth daily.     Marland Kitchen LORazepam (ATIVAN) 0.5 MG tablet Take 0.5 tablets (0.25 mg total) by mouth at bedtime. (Patient taking differently: Take 0.25 mg by mouth daily as needed for anxiety. ) 30  tablet 1  . lubiprostone (AMITIZA) 8 MCG capsule Take 8 mcg by mouth 2 (two) times daily with a meal.    . meclizine (ANTIVERT) 25 MG tablet Take 1 tablet (25 mg total) by mouth 3 (three) times daily as needed for dizziness. 45 tablet 1  . methocarbamol (ROBAXIN) 500 MG tablet Take 500 mg by mouth 4 (four) times daily.     . Multiple Vitamins-Minerals (DECUBI-VITE PO) Take 1 tablet by mouth daily.     . naproxen sodium (ANAPROX)  220 MG tablet Take 440 mg by mouth 2 (two) times daily with a meal.    . nitroGLYCERIN (NITROSTAT) 0.4 MG SL tablet Place 1 tablet (0.4 mg total) under the tongue as needed. For chest pains 30 tablet 6  . omeprazole (PRILOSEC) 40 MG capsule Take 1 capsule (40 mg total) by mouth daily. 30 capsule 3  . Propylene Glycol (SYSTANE BALANCE) 0.6 % SOLN Place 1 drop into both eyes 2 (two) times daily.     . rivaroxaban (XARELTO) 20 MG TABS tablet Take 20 mg by mouth daily with supper.    . simvastatin (ZOCOR) 80 MG tablet Take 1 tablet (80 mg total) by mouth daily. 30 tablet 6  . tamsulosin (FLOMAX) 0.4 MG CAPS capsule Take 1 capsule (0.4 mg total) by mouth daily after supper. 90 capsule 1  . torsemide (DEMADEX) 20 MG tablet Take 20 mg by mouth daily.    . traZODone (DESYREL) 100 MG tablet Take 100 mg by mouth at bedtime.     . Vitamin D, Ergocalciferol, (DRISDOL) 50000 units CAPS capsule Take 1 capsule (50,000 Units total) by mouth every 7 (seven) days. 30 capsule prn   Current Medications:  Scheduled Meds: . atorvastatin  40 mg Oral q1800  . azithromycin  250 mg Oral Daily  . benzonatate  100 mg Oral TID  . bisoprolol  2.5 mg Oral Daily  . collagenase  1 application Topical Daily  . dextromethorphan-guaiFENesin  1 tablet Oral BID  . docusate sodium  100 mg Oral BID  . DULoxetine  90 mg Oral Daily  . feeding supplement (ENSURE ENLIVE)  237 mL Oral Q24H  . feeding supplement (PRO-STAT SUGAR FREE 64)  30 mL Oral BID  . furosemide  40 mg Intravenous TID  . gabapentin  400  mg Oral TID  . insulin aspart  0-9 Units Subcutaneous TID WC  . insulin NPH Human  18 Units Subcutaneous BID AC & HS  . ipratropium-albuterol  3 mL Nebulization TID  . lidocaine  1 patch Transdermal Q24H  . lisinopril  2.5 mg Oral Daily  . LORazepam  0.25 mg Oral QHS  . lubiprostone  8 mcg Oral BID WC  . methylPREDNISolone (SOLU-MEDROL) injection  60 mg Intravenous Q12H  . multivitamin  1 tablet Oral Daily  . naloxegol oxalate  25 mg Oral Daily  . naproxen  500 mg Oral BID WC  . pantoprazole  40 mg Oral Daily  . polyvinyl alcohol  1 drop Both Eyes BID  . rivaroxaban  20 mg Oral Q supper  . sodium chloride flush  3 mL Intravenous Q12H  . tamsulosin  0.4 mg Oral QPC supper  . traZODone  100 mg Oral QHS  . [START ON 10/24/2015] Vitamin D (Ergocalciferol)  50,000 Units Oral Q Wed   Continuous Infusions:  PRN Meds:.sodium chloride, acetaminophen, albuterol, HYDROcodone-acetaminophen, meclizine, methocarbamol, nitroGLYCERIN, ondansetron (ZOFRAN) IV, sodium chloride flush, zolpidem   Results for orders placed or performed during the hospital encounter of 10/19/15 (from the past 48 hour(s))  Glucose, capillary     Status: Abnormal   Collection Time: 10/21/15  4:22 PM  Result Value Ref Range   Glucose-Capillary 201 (H) 65 - 99 mg/dL   Comment 1 Notify RN   Glucose, capillary     Status: Abnormal   Collection Time: 10/21/15 10:35 PM  Result Value Ref Range   Glucose-Capillary 189 (H) 65 - 99 mg/dL   Comment 1 Notify RN    Comment 2 Document in  Chart   Basic metabolic panel     Status: Abnormal   Collection Time: 10/22/15  3:17 AM  Result Value Ref Range   Sodium 136 135 - 145 mmol/L   Potassium 4.5 3.5 - 5.1 mmol/L   Chloride 98 (L) 101 - 111 mmol/L   CO2 28 22 - 32 mmol/L   Glucose, Bld 221 (H) 65 - 99 mg/dL   BUN 31 (H) 6 - 20 mg/dL   Creatinine, Ser 1.16 0.61 - 1.24 mg/dL   Calcium 8.5 (L) 8.9 - 10.3 mg/dL   GFR calc non Af Amer 59 (L) >60 mL/min   GFR calc Af Amer >60 >60  mL/min    Comment: (NOTE) The eGFR has been calculated using the CKD EPI equation. This calculation has not been validated in all clinical situations. eGFR's persistently <60 mL/min signify possible Chronic Kidney Disease.    Anion gap 10 5 - 15  Glucose, capillary     Status: Abnormal   Collection Time: 10/22/15  6:20 AM  Result Value Ref Range   Glucose-Capillary 221 (H) 65 - 99 mg/dL  CBC     Status: Abnormal   Collection Time: 10/22/15  8:10 AM  Result Value Ref Range   WBC 13.2 (H) 4.0 - 10.5 K/uL   RBC 4.51 4.22 - 5.81 MIL/uL   Hemoglobin 8.9 (L) 13.0 - 17.0 g/dL   HCT 32.0 (L) 39.0 - 52.0 %   MCV 71.0 (L) 78.0 - 100.0 fL   MCH 19.7 (L) 26.0 - 34.0 pg   MCHC 27.8 (L) 30.0 - 36.0 g/dL   RDW 18.0 (H) 11.5 - 15.5 %   Platelets 282 150 - 400 K/uL  Glucose, capillary     Status: Abnormal   Collection Time: 10/22/15 11:58 AM  Result Value Ref Range   Glucose-Capillary 209 (H) 65 - 99 mg/dL   Comment 1 Notify RN   Glucose, capillary     Status: Abnormal   Collection Time: 10/22/15  4:27 PM  Result Value Ref Range   Glucose-Capillary 279 (H) 65 - 99 mg/dL   Comment 1 Notify RN   Glucose, capillary     Status: Abnormal   Collection Time: 10/22/15  9:18 PM  Result Value Ref Range   Glucose-Capillary 267 (H) 65 - 99 mg/dL  Basic metabolic panel     Status: Abnormal   Collection Time: 10/23/15  4:42 AM  Result Value Ref Range   Sodium 133 (L) 135 - 145 mmol/L   Potassium 4.7 3.5 - 5.1 mmol/L   Chloride 96 (L) 101 - 111 mmol/L   CO2 26 22 - 32 mmol/L   Glucose, Bld 261 (H) 65 - 99 mg/dL   BUN 34 (H) 6 - 20 mg/dL   Creatinine, Ser 1.06 0.61 - 1.24 mg/dL   Calcium 8.5 (L) 8.9 - 10.3 mg/dL   GFR calc non Af Amer >60 >60 mL/min   GFR calc Af Amer >60 >60 mL/min    Comment: (NOTE) The eGFR has been calculated using the CKD EPI equation. This calculation has not been validated in all clinical situations. eGFR's persistently <60 mL/min signify possible Chronic  Kidney Disease.    Anion gap 11 5 - 15  CBC     Status: Abnormal   Collection Time: 10/23/15  4:42 AM  Result Value Ref Range   WBC 12.0 (H) 4.0 - 10.5 K/uL   RBC 4.50 4.22 - 5.81 MIL/uL   Hemoglobin 8.8 (L) 13.0 - 17.0 g/dL  HCT 31.5 (L) 39.0 - 52.0 %   MCV 70.0 (L) 78.0 - 100.0 fL   MCH 19.6 (L) 26.0 - 34.0 pg   MCHC 27.9 (L) 30.0 - 36.0 g/dL   RDW 17.9 (H) 11.5 - 15.5 %   Platelets 285 150 - 400 K/uL  Glucose, capillary     Status: Abnormal   Collection Time: 10/23/15  6:11 AM  Result Value Ref Range   Glucose-Capillary 273 (H) 65 - 99 mg/dL  Glucose, capillary     Status: Abnormal   Collection Time: 10/23/15 12:12 PM  Result Value Ref Range   Glucose-Capillary 191 (H) 65 - 99 mg/dL   Comment 1 Notify RN    *Note: Due to a large number of results and/or encounters for the requested time period, some results have not been displayed. A complete set of results can be found in Results Review.   No results found.   ECHO:  ------------------------------------------------------------------- Study Conclusions  - Left ventricle: The cavity size was mildly dilated. Wall   thickness was increased in a pattern of mild LVH. Systolic   function was moderately to severely reduced. The estimated   ejection fraction was in the range of 30% to 35%. Difficult   images even with Definity. There appears to be severe hypokinesis   of the inferior and inferolateral walls. Features are consistent   with a pseudonormal left ventricular filling pattern, with   concomitant abnormal relaxation and increased filling pressure   (grade 2 diastolic dysfunction). - Aortic valve: Trileaflet; moderately calcified leaflets. There   was no stenosis. - Mitral valve: Moderately calcified annulus. Mildly calcified   leaflets . There was moderate regurgitation. - Left atrium: The atrium was mildly dilated. - Right ventricle: Poorly visualized. The cavity size was normal.   Wall thickness was normal. -  Tricuspid valve: Peak RV-RA gradient (S): 24 mm Hg. - Pulmonary arteries: PA peak pressure: 39 mm Hg (S). - Systemic veins: IVC measured 2.6 cm with < 50% respirophasic   variation, suggesting RA pressure 15 mmHg.  Impressions:  - Mildly dilated LV with mild LV hypertrophy. Difficult images even   with Definity, but EF appears to be 30-35% with severe   hypokinesis of the inferior and inferolateral walls. Moderate   diastolic dysfunction. The RV was poorly visualized, probably   mildly decreased systolic function. Moderate mitral   regurgitation. Dilated IVC with mild pulmonary hypertension.  ROS: General:no colds or fevers, + weight increase from 08/2015 of 7-8 lbs Skin:no rashes + ulcers lower ext- room smells of gangrene HEENT:no blurred vision, no congestion CV:see HPI PUL:see HPI GI:no diarrhea constipation or melena, no indigestion GU:no hematuria, no dysuria MS:no joint pain, no claudication Neuro:no syncope, no lightheadedness Endo:+ diabetes, no thyroid disease  Blood pressure (!) 104/41, pulse 75, temperature 97.8 F (36.6 C), temperature source Oral, resp. rate (!) 21, weight 283 lb (128.4 kg), SpO2 91 %.  Wt Readings from Last 3 Encounters:  10/23/15 283 lb (128.4 kg)  10/19/15 282 lb (127.9 kg)  09/20/15 275 lb 1.6 oz (124.8 kg)    PE: per Dr. Stanford Breed   Assessment/Plan Principal Problem:   Acute on chronic respiratory failure with hypoxia ([place]) Active Problems:   HYPERCHOLESTEROLEMIA   Anxiety state   Depression   Essential hypertension   Coronary atherosclerosis   Long term current use of anticoagulant therapy   BPH (benign prostatic hyperplasia)   COPD exacerbation ([facility])   Peripheral vascular disease ([facility])   GERD (gastroesophageal reflux  disease)   Type II diabetes mellitus with neurological manifestations ([facility])   Chronic pulmonary embolism ([facility])   Acute on chronic systolic (congestive) heart failure ([facility])   Microcytic anemia   Acute systolic  and diastolic HF in combination with COPD --he is neg 1440 since admit ---on lasix 40 IV TID, -- wt down from 289 to 283 since admit -- unable to adjust meds with soft BP--he is on ACE low dose  ICM with decrease in EF not a candidate for ICD.  --will change bisoprolol to coreg.    LBBB has developed over last year  Hyperlipidemia continue statin   COPD exacerbation per IM on Nebs and steroids  Anticoagulation for hx PE and CVA  Lower ext venous stasis ulcers per IM  PAD followed by Dr. Bridgett Larsson no further interventions can be done.   CAD with hx of CABG 1992 with native and VG disease.  Occluded native RCA and LCX  And occluded grafts to these vessels.  Patent LAD and diag with minimal disease.         Cecilie Kicks  Nurse Practitioner Certified [place] Pager [phone] or after 5pm or weekends call [phone] 10/23/2015, 1:09 PM  As above, patient seen and examined. Briefly he is a 75 year old male with past medical history of coronary artery disease status post coronary artery bypass graft, ischemic cardiomyopathy, chronic combined systolic/diastolic congestive heart failure, peripheral vascular disease, prior pulmonary embolus and CVA on chronic anticoagulation, diabetes mellitus, COPD, no CODE BLUE status for evaluation of acute on chronic combined systolic/diastolic congestive heart failure. Patient is a difficult historian. He resides in a nursing home. He presented because of what he describes as generalized weakness, bilateral lower extremity pain, and cough productive of yellowish sputum. He has chronic dyspnea on exertion but states it is unchanged. He denies chest pain or syncope. He has chronic pedal edema. He was admitted and started on diuretics and antibiotics. Echocardiogram showed ejection fraction 30-35%, moderate mitral regurgitation and cardiology asked to evaluate. ECG sinus tach with LBBB. BNP 446.  On exam patient is morbidly obese. He does  have peripheral edema.  1 acute on chronic combined systolic/diastolic congestive heart failure-he is volume overloaded. Plan to continue present dose of Lasix. Follow renal function. 2 ischemic cardiomyopathy-change bisoprolol to carvedilol 3.125 mg twice a day. Advance as tolerated by blood pressure. Continue ACE inhibitor. Add spironolactone 25 mg daily. Patient would not be a candidate for ICD. 3 peripheral vascular disease-continue statin. Management per vascular surgery. 4 coronary artery disease-continue statin. Not on aspirin as he is chronically anticoagulated. 5 hyperlipidemia-continue statin. 6 COPD/URI-Antibiotics and pulmonary toilet per primary service.  Kirk Ruths, MD

## 2015-10-23 NOTE — Progress Notes (Signed)
Physical Therapy Treatment Patient Details Name: [name] MRN: [id number] DOB: 1940/07/18 Today's Date: 10/23/2015    History of Present Illness [name] is a 75 y.o. male with medical history significant of sCHF, hypertension, PVD, diabetes mellitus, hyperlipidemia, COPD, GERD, depression, anxiety, CAD, s/p of CABG, PE on Xarelto, stroke, BPH, who presents with shortness of breath.  Admitted with Acute on chronic respiratory failure with hypoxia    PT Comments    Patient in bed soaked with urine. Patient aware, however had not called nursing. Assisted patient to sit EOB and assisted with changing gown. While standing with RW, performed pericare. Patient then fatigued quickly during transfer with abrupt "flop" onto edge of seat. "I got too tired and had to sit." Total time on his feet ~60 seconds.    Follow Up Recommendations  SNF     Equipment Recommendations  None recommended by PT    Recommendations for Other Services       Precautions / Restrictions Precautions Precautions: Fall Required Braces or Orthoses: Other Brace/Splint Other Brace/Splint: PRAFO BIL    Mobility  Bed Mobility Overal bed mobility: Needs Assistance Bed Mobility: Supine to Sit     Supine to sit: Mod assist;HOB elevated     General bed mobility comments: Mod assist for truncal assist to rise EOB and use of bed pad to assist pt lightly to EOB.  Transfers Overall transfer level: Needs assistance Equipment used: Rolling walker (2 wheeled) Transfers: Sit to/from Stand Sit to Stand: Mod assist;+2 physical assistance;+2 safety/equipment Stand pivot transfers: Min assist;+2 safety/equipment       General transfer comment: Mod assist for boost to stand from bed. Cues for hand placement and anterior weight shift to rise. Min assist for balance with pivot and to get hips back onto chair as pt sat abruptly midway through pivot.  Ambulation/Gait             General Gait Details: pt  reports he has not been able to ambulate for 1 year (only pivots)   Stairs            Wheelchair Mobility    Modified Rankin (Stroke Patients Only)       Balance     Sitting balance-[name]: Fair       Standing balance-[name]: Poor                      Cognition Arousal/Alertness: Awake/alert Behavior During Therapy: WFL for tasks assessed/performed Overall Cognitive Status: Within Functional Limits for tasks assessed                      Exercises      General Comments        Pertinent Vitals/Pain Pain Assessment: Faces Faces Pain Scale: Hurts whole lot Pain Location: Rt shoulder with any movement or palpation Pain Descriptors / Indicators: Discomfort;Grimacing;Guarding Pain Intervention(s): Limited activity within patient's tolerance;Monitored during session    Home Living                      Prior Function            PT Goals (current goals can now be found in the care plan section) Acute Rehab PT Goals Patient Stated Goal: Get stronger Time For Goal Achievement: 11/03/15 Progress towards PT goals: Progressing toward goals    Frequency  Min 2X/week    PT Plan Current plan remains appropriate    Co-evaluation  End of Session Equipment Utilized During Treatment: Gait belt;Oxygen Activity Tolerance: Patient limited by fatigue Patient left: in chair;with call bell/phone within reach;with chair alarm set;Other (comment) (respiratory in room)     Time: [id number] PT Time Calculation (min) (ACUTE ONLY): 27 min  Charges:  $Therapeutic Activity: 23-37 mins                    G Codes:      Jennylee Uehara 03-Nov-2015, 3:49 PM  Pager [phone]

## 2015-10-24 ENCOUNTER — Other Ambulatory Visit: Payer: Self-pay | Admitting: *Deleted

## 2015-10-24 ENCOUNTER — Inpatient Hospital Stay (HOSPITAL_COMMUNITY): Payer: Medicare Other

## 2015-10-24 DIAGNOSIS — R0600 Dyspnea, unspecified: Secondary | ICD-10-CM

## 2015-10-24 DIAGNOSIS — I25810 Atherosclerosis of coronary artery bypass graft(s) without angina pectoris: Secondary | ICD-10-CM

## 2015-10-24 DIAGNOSIS — I739 Peripheral vascular disease, unspecified: Secondary | ICD-10-CM

## 2015-10-24 DIAGNOSIS — M79609 Pain in unspecified limb: Secondary | ICD-10-CM

## 2015-10-24 DIAGNOSIS — I6523 Occlusion and stenosis of bilateral carotid arteries: Secondary | ICD-10-CM

## 2015-10-24 DIAGNOSIS — M7989 Other specified soft tissue disorders: Secondary | ICD-10-CM

## 2015-10-24 DIAGNOSIS — L8990 Pressure ulcer of unspecified site, unspecified stage: Secondary | ICD-10-CM | POA: Insufficient documentation

## 2015-10-24 LAB — CBC
HEMATOCRIT: 31.7 % — AB (ref 39.0–52.0)
Hemoglobin: 8.8 g/dL — ABNORMAL LOW (ref 13.0–17.0)
MCH: 19.5 pg — ABNORMAL LOW (ref 26.0–34.0)
MCHC: 27.8 g/dL — ABNORMAL LOW (ref 30.0–36.0)
MCV: 70.3 fL — AB (ref 78.0–100.0)
Platelets: 317 10*3/uL (ref 150–400)
RBC: 4.51 MIL/uL (ref 4.22–5.81)
RDW: 18.2 % — AB (ref 11.5–15.5)
WBC: 12.5 10*3/uL — AB (ref 4.0–10.5)

## 2015-10-24 LAB — BASIC METABOLIC PANEL
ANION GAP: 9 (ref 5–15)
BUN: 35 mg/dL — ABNORMAL HIGH (ref 6–20)
CO2: 30 mmol/L (ref 22–32)
Calcium: 8.5 mg/dL — ABNORMAL LOW (ref 8.9–10.3)
Chloride: 97 mmol/L — ABNORMAL LOW (ref 101–111)
Creatinine, Ser: 1.12 mg/dL (ref 0.61–1.24)
GFR calc Af Amer: >60 mL/min (ref >60)
GFR calc non Af Amer: >60 mL/min (ref >60)
GLUCOSE: 271 mg/dL — AB (ref 65–99)
POTASSIUM: 4.3 mmol/L (ref 3.5–5.1)
Sodium: 136 mmol/L (ref 135–145)

## 2015-10-24 LAB — GLUCOSE, CAPILLARY
GLUCOSE-CAPILLARY: 275 mg/dL — AB (ref 65–99)
GLUCOSE-CAPILLARY: 262 mg/dL — AB (ref 65–99)
Glucose-Capillary: 274 mg/dL — ABNORMAL HIGH (ref 65–99)
Glucose-Capillary: 234 mg/dL — ABNORMAL HIGH (ref 65–99)

## 2015-10-24 MED ORDER — INSULIN ASPART 100 UNIT/ML ~~LOC~~ SOLN
4.0000 [IU] | Freq: Three times a day (TID) | SUBCUTANEOUS | Status: DC
  Administered 2015-10-24 – 2015-10-29 (×14): 4 [IU] via SUBCUTANEOUS

## 2015-10-24 MED ORDER — METHYLPREDNISOLONE SODIUM SUCC 125 MG IJ SOLR
60.0000 mg | Freq: Every day | INTRAMUSCULAR | Status: DC
  Administered 2015-10-24 – 2015-10-25 (×2): 60 mg via INTRAVENOUS
  Filled 2015-10-24 (×2): qty 2

## 2015-10-24 MED ORDER — SPIRONOLACTONE 25 MG PO TABS
25.0000 mg | ORAL_TABLET | Freq: Every day | ORAL | Status: DC
  Administered 2015-10-24 – 2015-10-29 (×6): 25 mg via ORAL
  Filled 2015-10-24 (×6): qty 1

## 2015-10-24 MED ORDER — FUROSEMIDE 10 MG/ML IJ SOLN
80.0000 mg | Freq: Two times a day (BID) | INTRAMUSCULAR | Status: DC
  Administered 2015-10-24 – 2015-10-27 (×7): 80 mg via INTRAVENOUS
  Filled 2015-10-24 (×8): qty 8

## 2015-10-24 MED ORDER — INSULIN NPH (HUMAN) (ISOPHANE) 100 UNIT/ML ~~LOC~~ SUSP
20.0000 [IU] | Freq: Two times a day (BID) | SUBCUTANEOUS | Status: DC
Start: 2015-10-24 — End: 2015-10-27
  Administered 2015-10-24 – 2015-10-27 (×5): 20 [IU] via SUBCUTANEOUS

## 2015-10-24 NOTE — Progress Notes (Signed)
    Subjective:  Denies CP; no change in chronic dyspnea and weakness.   Objective:  Vitals:   10/23/15 1214 10/23/15 1947 10/23/15 2055 10/24/15 0648  BP: (!) 104/41 (!) 100/54  (!) 111/59  Pulse: 75 66 80 68  Resp: (!) 21 18 18 18   Temp: 97.8 F (36.6 C) 98.2 F (36.8 C)  97.8 F (36.6 C)  TempSrc: Oral Oral  Oral  SpO2: 91% 98% 97% 96%  Weight:    274 lb (124.3 kg)    Intake/Output from previous day:  Intake/Output Summary (Last 24 hours) at 10/24/15 0929 Last data filed at 10/24/15 0816  Gross per 24 hour  Intake              720 ml  Output              500 ml  Net              220 ml    Physical Exam: Physical exam: Well-developed obese in no acute distress; chronically ill appearing.  Skin is warm and dry.  HEENT is normal.  Neck is supple.  Chest diminished BS bases Cardiovascular exam is regular rate and rhythm.  Abdominal exam nontender or distended. No masses palpated. + abdominal wall edema Extremities show 2+ edema. Lower ext wrapped neuro grossly intact    Lab Results: Basic Metabolic Panel:  Recent Labs  10/23/15 0442 10/24/15 0533  NA 133* 136  K 4.7 4.3  CL 96* 97*  CO2 26 30  GLUCOSE 261* 271*  BUN 34* 35*  CREATININE 1.06 1.12  CALCIUM 8.5* 8.5*   CBC:  Recent Labs  10/23/15 0442 10/24/15 0533  WBC 12.0* 12.5*  HGB 8.8* 8.8*  HCT 31.5* 31.7*  MCV 70.0* 70.3*  PLT 285 317     Assessment/Plan:  1 acute on chronic combined systolic/diastolic congestive heart failure-he remains volume overloaded. I/O +520.  Weight 274. Increase lasix to 80 BID. Follow renal function. 2 ischemic cardiomyopathy-continue carvedilol 3.125 mg twice a day. Advance as tolerated by blood pressure. Continue ACE inhibitor. Continue spironolactone 25 mg daily. Patient would not be a candidate for ICD. 3 peripheral vascular disease-continue statin. Management per vascular surgery. 4 coronary artery disease-continue statin. Not on aspirin as he is  chronically anticoagulated. 5 hyperlipidemia-continue statin. 6 COPD/URI-Antibiotics and pulmonary toilet per primary service.  Kirk Ruths 10/24/2015, 9:29 AM

## 2015-10-24 NOTE — Progress Notes (Addendum)
Inpatient Diabetes Program Recommendations  AACE/ADA: New Consensus Statement on Inpatient Glycemic Control (2015)  Target Ranges:  Prepandial:   less than 140 mg/dL      Peak postprandial:   less than 180 mg/dL (1-2 hours)      Critically ill patients:  140 - 180 mg/dL   Results for [name], [name] (MRN [id number]) as of 10/24/2015 12:16  Ref. Range 10/23/2015 06:11 10/23/2015 12:12 10/23/2015 16:51 10/23/2015 20:59 10/24/2015 06:19 10/24/2015 11:29  Glucose-Capillary Latest Ref Range: 65 - 99 mg/dL 273 (H) 191 (H) 278 (H) 198 (H) 274 (H) 234 (H)   Review of Glycemic Control  Diabetes history: DM2 Outpatient Diabetes medications: NPH 22 units BID, Humalog 8 units TID with meals Current orders for Inpatient glycemic control: NPH 18 units BID, Novolog 0-9 units TID with meals  Inpatient Diabetes Program Recommendations: Insulin - Basal: Please consider increasing NPH to 20 units BID. Insulin - Meal Coverage: Please consider ordering Novolog 4 units TID with meals for meal coverage if patient eats at least 50% of meals.  Thanks, Barnie Alderman, RN, MSN, CDE Diabetes Coordinator Inpatient Diabetes Program [phone] (Team Pager from [place] to [place]) [phone] ([place] office) [phone] [facility] office) [phone] [facility] office)

## 2015-10-24 NOTE — Progress Notes (Signed)
*  Preliminary Results* Bilateral upper extremity venous duplex completed. Study was technically limited and difficult due to poor patient cooperation, patient body habitus, patient cooperation, and depth of vessels. Visualized veins of bilateral upper extremities are negative for obvious acute deep and superficial vein thrombosis.  10/24/2015 8:42 AM  Maudry Mayhew, B.S., RVT, RDCS, RDMS

## 2015-10-24 NOTE — Progress Notes (Signed)
PROGRESS NOTE    [name]  [id number] DOB: 1940/10/05 DOA: 10/19/2015 PCP: Todd Sabal, MD   Brief Narrative:  HPI on 10/19/2015 by Dr. Leory [name] is a 75 y.o. male with medical history significant of sCHF with EF 40-45%, hypertension, PVD, diabetes mellitus, hyperlipidemia, COPD, GERD, depression, anxiety, CAD, s/p of CABG, PE on Xarelto, stroke, BPH, who presents with shortness of breath.   Patient reports that he has been having shortness of breath for nearly a month, which has been progressively getting worse. He also has cough with greenish colored sputum production. He has mild intermittent chest pain. It is pleuritic and is aggravated by coughing. No fever or chills. Patient denies nausea, vomiting, abdominal pain, diarrhea. He states that he has mild dysuria sometimes, but no burning or increased urinary frequency. Patient does not have unilateral weakness, rashes. He denies any rectal bleeding, dark stool or melena.   Assessment & Plan   Acute on chronic respiratory failure with hypoxia -Most likely due to combination of COPD and CHF exacerbation. -Treatment and plan below -Will ask RN to monitor O2 sats as patient states he does not use oxygen at SNF.  Acute on chronic systolic  Heart failure -Echocardiogram 07/19/14 showed EF 40-45% -BNP 446, +JVD, LE edema upon admission -CXR mild pulm vascular congestion -Monitor intake/output, daily weights. Output over past 24hours 200 cc (?question accuracy). Weight has dropped about 15lbs since admission -Echocardiogram EF [id number], garde 2 diastolic dysfunction  -Continue lisinopril  -Cardiology consulted and appreciated, increased lasix to 80mg  BID, added spironolactone, and switched to coreg  COPD exacerbation  -Cough has been ongoing for 4 weeks per patient.  -Upon admission, patient had bilateral wheezing on auscultation -Nebulizers: scheduled Duoneb and prn albuterol nebs -Continue Solu-Medrol,  azithromycin, mucinex -Blood cultures from 10/20/2015 show 1/2 GPC-SCN -Respiratory viral panel unremarkable   ?Bacteremia vs contamination -Blood cultures from 10/20/2015 show 1/2 GPC-SCN (likely contamination) -Repeat Blood cultures from 7/30 show no growth to date  Microcytic anemia -Basline hemoglobin 12-13 (waas 12.3 on 07/26/15) -Currently Hemoglobin 8.8 (was 8.7 upon admission) -INR 1.39 -FOBT negative -Anemia panel: Iron 12, ferritin 38, B12 301 -Was given dose of ferrahem and B12 -Continue to monitor CBC closely  History of PE -CTA chest neg for PE   -Monitor respiratory status closely -Continue xarelto  Hyperlipidemia -Last LDL was 58 on 10/19/10 -Continue statin  Depression and anxiety -Continue Cymbalta and Ativan  GERD -Continue Protonix  Essential hypertension -Continue Zebeta and lisinopril (with holding parameters)  BPH -Continue Flomax  Diabetes Mellitus, type II -Last A1c 7.4 -Continue NPH, ISS, CBG monitoring  PAD and chronic leg wound -Patient has been followed with [facility]. He was seen on 10/18/16 by NP, [name]. Per clinic note, wound care center is now managing and evaluating the wounds in his lower legs; he has unna boots on both lower legs. Pt refused to allow the ultrasonographer to removed the unna boot compression dressings in his lower legs in order to perform ABI's. Per Dr. Bridgett [name], Pt is not a candidate for any further surgical interventions, need to focus on wound care in this patient primarily. -Wound care consulted  Upper extremity edema -Per patient, he did have an ultrasound of his arms done prior to admission.  -Only carotid doppler done: 1-39% stenosis of RICA, patent left carotid endarterectomy, no Left ICA stenosis -Upper ext doppler negative for DVT -Keep arms elevated.  DVT Prophylaxis  Xarelto  Code Status: DNR  Family Communication: None at bedside  Disposition Plan:  Admitted.  Lasix increased. Continue to monitor   Consultants Cardiology  Procedures  Echocardiogram Upper ext doppler  Antibiotics   Anti-infectives    Start     Dose/Rate Route Frequency Ordered Stop   10/21/15 2000  vancomycin (VANCOCIN) IVPB 1000 mg/200 mL premix  Status:  Discontinued     1,000 mg 100 mL/hr over 120 Minutes Intravenous Every 12 hours 10/21/15 0730 10/21/15 1146   10/21/15 1000  azithromycin (ZITHROMAX) tablet 250 mg     250 mg Oral Daily 10/20/15 0013 10/24/15 1040   10/21/15 0745  vancomycin (VANCOCIN) 2,000 mg in sodium chloride 0.9 % 500 mL IVPB     2,000 mg 125 mL/hr over 240 Minutes Intravenous  Once 10/21/15 0730 10/21/15 1329   10/21/15 0700  ceFAZolin (ANCEF) IVPB 2g/100 mL premix     2 g 200 mL/hr over 30 Minutes Intravenous  Once 10/21/15 0621 10/21/15 0730   10/20/15 0015  azithromycin (ZITHROMAX) tablet 500 mg     500 mg Oral  Once 10/20/15 0013 10/20/15 0202      Subjective:   [name] seen and examined today. Patient feels his breathing has mildly improved, however not back to his baseline. Denies chest pain, abdominal pain, nausea/vomiting, diarrhea,constipation, dizziness, headache.  Continues to have upper arm swelling, but feels that his improved mildly.   Objective:   Vitals:   10/23/15 2055 10/24/15 0648 10/24/15 0933 10/24/15 1129  BP:  (!) 111/59  (!) 116/45  Pulse: 80 68  65  Resp: 18 18  18   Temp:  97.8 F (36.6 C)  98.3 F (36.8 C)  TempSrc:  Oral  Oral  SpO2: 97% 96% 95% 97%  Weight:  124.3 kg (274 lb)      Intake/Output Summary (Last 24 hours) at 10/24/15 1136 Last data filed at 10/24/15 1127  Gross per 24 hour  Intake              963 ml  Output              700 ml  Net              263 ml   Filed Weights   10/22/15 0345 10/23/15 0337 10/24/15 0648  Weight: 127.9 kg (282 lb) 128.4 kg (283 lb) 124.3 kg (274 lb)    Exam  General: Well developed, well nourished, chronically ill, NAD  HEENT: NCAT, mucous  membranes moist.   Cardiovascular: S1 S2 auscultated, no murmurs, RRR  Respiratory: Diminished, but clear, no wheezing   Abdomen: Soft, nontender, nondistended, + bowel sounds  Extremities: warm dry without cyanosis clubbing. +1-2LE edema. 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 Upper extremity edema, R>L  Neuro: AAOx3, nonfocal  Skin: UNNA boots in place on LE  Psych: Normal affect and demeanor with intact judgement and insight, pleasant   Data Reviewed: I have personally reviewed following labs and imaging studies  CBC:  Recent Labs Lab 10/19/15 1833  10/21/15 0708 10/21/15 1006 10/22/15 0810 10/23/15 0442 10/24/15 0533  WBC 13.8*  < > 13.7* 15.3* 13.2* 12.0* 12.5*  NEUTROABS 11.2*  --   --   --   --   --   --   HGB 8.7*  < > 8.2* 9.0* 8.9* 8.8* 8.8*  HCT 31.0*  < > 28.2* 32.4* 32.0* 31.5* 31.7*  MCV 70.9*  < > 68.8* 70.7* 71.0* 70.0* 70.3*  PLT 336  < > 299 298 282 285 317  < > =  values in this interval not displayed. Basic Metabolic Panel:  Recent Labs Lab 10/19/15 1833 10/21/15 0708 10/22/15 0317 10/23/15 0442 10/24/15 0533  NA 134* 134* 136 133* 136  K 4.8 4.9 4.5 4.7 4.3  CL 98* 98* 98* 96* 97*  CO2 29 26 28 26 30   GLUCOSE 123* 213* 221* 261* 271*  BUN 15 27* 31* 34* 35*  CREATININE 0.93 1.10 1.16 1.06 1.12  CALCIUM 8.4* 8.3* 8.5* 8.5* 8.5*   GFR: Estimated Creatinine Clearance: 72.1 mL/min (by C-G formula based on SCr of 1.12 mg/dL). Liver Function Tests: No results for input(s): AST, ALT, ALKPHOS, BILITOT, PROT, ALBUMIN in the last 168 hours. No results for input(s): LIPASE, AMYLASE in the last 168 hours. No results for input(s): AMMONIA in the last 168 hours. Coagulation Profile:  Recent Labs Lab 10/19/15 1833  INR 1.39   Cardiac Enzymes: No results for input(s): CKTOTAL, CKMB, CKMBINDEX, TROPONINI in the last 168 hours. BNP (last 3 results) No results for input(s): PROBNP in the last 8760 hours. HbA1C: No results for input(s): HGBA1C in the last 72  hours. CBG:  Recent Labs Lab 10/23/15 0611 10/23/15 1212 10/23/15 1651 10/23/15 2059 10/24/15 0619  GLUCAP 273* 191* 278* 198* 274*   Lipid Profile: No results for input(s): CHOL, HDL, LDLCALC, TRIG, CHOLHDL, LDLDIRECT in the last 72 hours. Thyroid Function Tests: No results for input(s): TSH, T4TOTAL, FREET4, T3FREE, THYROIDAB in the last 72 hours. Anemia Panel: No results for input(s): VITAMINB12, FOLATE, FERRITIN, TIBC, IRON, RETICCTPCT in the last 72 hours. Urine analysis:    Component Value Date/Time   COLORURINE YELLOW 10/20/2015 [place] 10/20/2015 1655   LABSPEC 1.018 10/20/2015 1655   PHURINE 6.0 10/20/2015 1655   GLUCOSEU NEGATIVE 10/20/2015 1655   HGBUR NEGATIVE 10/20/2015 1655   HGBUR negative 03/06/2010 1419   BILIRUBINUR NEGATIVE 10/20/2015 1655   KETONESUR NEGATIVE 10/20/2015 1655   PROTEINUR NEGATIVE 10/20/2015 1655   UROBILINOGEN 0.2 09/13/2014 0945   NITRITE NEGATIVE 10/20/2015 1655   LEUKOCYTESUR NEGATIVE 10/20/2015 1655   Sepsis Labs: @LABRCNTIP (procalcitonin:4,lacticidven:4)  ) Recent Results (from the past 240 hour(s))  Respiratory Panel by PCR     Status: None   Collection Time: 10/20/15  2:12 AM  Result Value Ref Range Status   Adenovirus NOT DETECTED NOT DETECTED Final   Coronavirus 229E NOT DETECTED NOT DETECTED Final   Coronavirus HKU1 NOT DETECTED NOT DETECTED Final   Coronavirus NL63 NOT DETECTED NOT DETECTED Final   Coronavirus OC43 NOT DETECTED NOT DETECTED Final   Metapneumovirus NOT DETECTED NOT DETECTED Final   Rhinovirus / Enterovirus NOT DETECTED NOT DETECTED Final   Influenza A NOT DETECTED NOT DETECTED Final   Influenza A H1 NOT DETECTED NOT DETECTED Final   Influenza A H1 2009 NOT DETECTED NOT DETECTED Final   Influenza A H3 NOT DETECTED NOT DETECTED Final   Influenza B NOT DETECTED NOT DETECTED Final   Parainfluenza Virus 1 NOT DETECTED NOT DETECTED Final   Parainfluenza Virus 2 NOT DETECTED NOT DETECTED  Final   Parainfluenza Virus 3 NOT DETECTED NOT DETECTED Final   Parainfluenza Virus 4 NOT DETECTED NOT DETECTED Final   Respiratory Syncytial Virus NOT DETECTED NOT DETECTED Final   Bordetella pertussis NOT DETECTED NOT DETECTED Final   Chlamydophila pneumoniae NOT DETECTED NOT DETECTED Final   Mycoplasma pneumoniae NOT DETECTED NOT DETECTED Final  Culture, blood (Routine X 2) w Reflex to ID Panel     Status: Abnormal   Collection Time: 10/20/15  6:15 AM  Result Value Ref Range Status   Specimen Description BLOOD RIGHT HAND  Final   Special Requests IN PEDIATRIC BOTTLE 2ML  Final   Culture  Setup Time   Final    GRAM POSITIVE COCCI IN CLUSTERS IN PEDIATRIC BOTTLE CRITICAL RESULT CALLED TO, READ BACK BY AND VERIFIED WITH: TO KAMEND(PHARD) BY TCLEVELAND 10/21/2015 AT 5:44AM    Culture (A)  Final    STAPHYLOCOCCUS SPECIES (COAGULASE NEGATIVE) THE SIGNIFICANCE OF ISOLATING THIS ORGANISM FROM A SINGLE SET OF BLOOD CULTURES WHEN MULTIPLE SETS ARE DRAWN IS UNCERTAIN. PLEASE NOTIFY THE MICROBIOLOGY DEPARTMENT WITHIN ONE WEEK IF SPECIATION AND SENSITIVITIES ARE REQUIRED.    Report Status 10/23/2015 FINAL  Final  Blood Culture ID Panel (Reflexed)     Status: Abnormal   Collection Time: 10/20/15  6:15 AM  Result Value Ref Range Status   Enterococcus species NOT DETECTED NOT DETECTED Final   Vancomycin resistance NOT DETECTED NOT DETECTED Final   Listeria monocytogenes NOT DETECTED NOT DETECTED Final   Staphylococcus species DETECTED (A) NOT DETECTED Final    Comment: CRITICAL RESULT CALLED TO, READ BACK BY AND VERIFIED WITH: TO KAMENED(PHARD) BY TCLEVELAND 10/21/2015 AT 5:44AM    Staphylococcus aureus NOT DETECTED NOT DETECTED Final   Methicillin resistance NOT DETECTED NOT DETECTED Final   Streptococcus species NOT DETECTED NOT DETECTED Final   Streptococcus agalactiae NOT DETECTED NOT DETECTED Final   Streptococcus pneumoniae NOT DETECTED NOT DETECTED Final   Streptococcus pyogenes NOT  DETECTED NOT DETECTED Final   Acinetobacter baumannii NOT DETECTED NOT DETECTED Final   Enterobacteriaceae species NOT DETECTED NOT DETECTED Final   Enterobacter cloacae complex NOT DETECTED NOT DETECTED Final   Escherichia coli NOT DETECTED NOT DETECTED Final   Klebsiella oxytoca NOT DETECTED NOT DETECTED Final   Klebsiella pneumoniae NOT DETECTED NOT DETECTED Final   Proteus species NOT DETECTED NOT DETECTED Final   Serratia marcescens NOT DETECTED NOT DETECTED Final   Carbapenem resistance NOT DETECTED NOT DETECTED Final   Haemophilus influenzae NOT DETECTED NOT DETECTED Final   Neisseria meningitidis NOT DETECTED NOT DETECTED Final   Pseudomonas aeruginosa NOT DETECTED NOT DETECTED Final   Candida albicans NOT DETECTED NOT DETECTED Final   Candida glabrata NOT DETECTED NOT DETECTED Final   Candida krusei NOT DETECTED NOT DETECTED Final   Candida parapsilosis NOT DETECTED NOT DETECTED Final   Candida tropicalis NOT DETECTED NOT DETECTED Final  Culture, blood (Routine X 2) w Reflex to ID Panel     Status: None (Preliminary result)   Collection Time: 10/20/15  6:18 AM  Result Value Ref Range Status   Specimen Description BLOOD RIGHT ARM  Final   Special Requests IN PEDIATRIC BOTTLE 1ML  Final   Culture NO GROWTH 3 DAYS  Final   Report Status PENDING  Incomplete  MRSA PCR Screening     Status: None   Collection Time: 10/20/15  8:44 AM  Result Value Ref Range Status   MRSA by PCR NEGATIVE NEGATIVE Final    Comment:        The GeneXpert MRSA Assay (FDA approved for NASAL specimens only), is one component of a comprehensive MRSA colonization surveillance program. It is not intended to diagnose MRSA infection nor to guide or monitor treatment for MRSA infections.   Culture, blood (routine x 2)     Status: None (Preliminary result)   Collection Time: 10/21/15 10:17 AM  Result Value Ref Range Status   Specimen Description BLOOD RIGHT HAND  Final  Special Requests IN PEDIATRIC  BOTTLE .5CC  Final   Culture NO GROWTH 2 DAYS  Final   Report Status PENDING  Incomplete  Culture, blood (routine x 2)     Status: None (Preliminary result)   Collection Time: 10/21/15 10:24 AM  Result Value Ref Range Status   Specimen Description BLOOD RIGHT HAND  Final   Special Requests IN PEDIATRIC BOTTLE  .5CC  Final   Culture NO GROWTH 2 DAYS  Final   Report Status PENDING  Incomplete      Radiology Studies: No results found.   Scheduled Meds: . atorvastatin  40 mg Oral q1800  . benzonatate  100 mg Oral TID  . carvedilol  3.125 mg Oral BID WC  . collagenase  1 application Topical Daily  . dextromethorphan-guaiFENesin  1 tablet Oral BID  . docusate sodium  100 mg Oral BID  . DULoxetine  90 mg Oral Daily  . feeding supplement (ENSURE ENLIVE)  237 mL Oral Q24H  . feeding supplement (PRO-STAT SUGAR FREE 64)  30 mL Oral BID  . furosemide  80 mg Intravenous BID  . gabapentin  400 mg Oral TID  . insulin aspart  0-9 Units Subcutaneous TID WC  . insulin NPH Human  18 Units Subcutaneous BID AC & HS  . ipratropium-albuterol  3 mL Nebulization TID  . lidocaine  1 patch Transdermal Q24H  . lisinopril  2.5 mg Oral Daily  . LORazepam  0.25 mg Oral QHS  . lubiprostone  8 mcg Oral BID WC  . methylPREDNISolone (SOLU-MEDROL) injection  60 mg Intravenous Daily  . multivitamin  1 tablet Oral Daily  . naloxegol oxalate  25 mg Oral Daily  . naproxen  500 mg Oral BID WC  . pantoprazole  40 mg Oral Daily  . polyvinyl alcohol  1 drop Both Eyes BID  . rivaroxaban  20 mg Oral Q supper  . sodium chloride flush  3 mL Intravenous Q12H  . spironolactone  25 mg Oral Daily  . tamsulosin  0.4 mg Oral QPC supper  . traZODone  100 mg Oral QHS  . Vitamin D (Ergocalciferol)  50,000 Units Oral Q Wed   Continuous Infusions:    LOS: 5 days   Time Spent in minutes   30 minutes  Nashae Maudlin D.O. on 10/24/2015 at 11:36 AM  Between 7am to 7pm - Pager - [phone]  After 7pm go to  [email or web address] - password [email or web address]  And look for the night coverage person covering for me after hours  [facility] Office  [phone]

## 2015-10-25 DIAGNOSIS — I739 Peripheral vascular disease, unspecified: Secondary | ICD-10-CM

## 2015-10-25 DIAGNOSIS — I5023 Acute on chronic systolic (congestive) heart failure: Secondary | ICD-10-CM

## 2015-10-25 DIAGNOSIS — J441 Chronic obstructive pulmonary disease with (acute) exacerbation: Secondary | ICD-10-CM

## 2015-10-25 DIAGNOSIS — K219 Gastro-esophageal reflux disease without esophagitis: Secondary | ICD-10-CM

## 2015-10-25 DIAGNOSIS — J9621 Acute and chronic respiratory failure with hypoxia: Secondary | ICD-10-CM

## 2015-10-25 DIAGNOSIS — E1149 Type 2 diabetes mellitus with other diabetic neurological complication: Secondary | ICD-10-CM

## 2015-10-25 LAB — CBC
HEMATOCRIT: 34.6 % — AB (ref 39.0–52.0)
HEMOGLOBIN: 9.6 g/dL — AB (ref 13.0–17.0)
MCH: 19.7 pg — ABNORMAL LOW (ref 26.0–34.0)
MCHC: 27.7 g/dL — ABNORMAL LOW (ref 30.0–36.0)
MCV: 70.9 fL — ABNORMAL LOW (ref 78.0–100.0)
Platelets: 343 10*3/uL (ref 150–400)
RBC: 4.88 MIL/uL (ref 4.22–5.81)
RDW: 18.7 % — ABNORMAL HIGH (ref 11.5–15.5)
WBC: 14.5 10*3/uL — ABNORMAL HIGH (ref 4.0–10.5)

## 2015-10-25 LAB — CULTURE, BLOOD (ROUTINE X 2): CULTURE: NO GROWTH

## 2015-10-25 LAB — GLUCOSE, CAPILLARY
GLUCOSE-CAPILLARY: 115 mg/dL — AB (ref 65–99)
Glucose-Capillary: 157 mg/dL — ABNORMAL HIGH (ref 65–99)
Glucose-Capillary: 171 mg/dL — ABNORMAL HIGH (ref 65–99)
Glucose-Capillary: 147 mg/dL — ABNORMAL HIGH (ref 65–99)

## 2015-10-25 LAB — BASIC METABOLIC PANEL
ANION GAP: 10 (ref 5–15)
BUN: 34 mg/dL — ABNORMAL HIGH (ref 6–20)
CALCIUM: 8.7 mg/dL — AB (ref 8.9–10.3)
CO2: 32 mmol/L (ref 22–32)
Chloride: 96 mmol/L — ABNORMAL LOW (ref 101–111)
Creatinine, Ser: 0.87 mg/dL (ref 0.61–1.24)
Glucose, Bld: 148 mg/dL — ABNORMAL HIGH (ref 65–99)
POTASSIUM: 3.9 mmol/L (ref 3.5–5.1)
Sodium: 138 mmol/L (ref 135–145)

## 2015-10-25 MED ORDER — PREDNISONE 50 MG PO TABS
50.0000 mg | ORAL_TABLET | Freq: Every day | ORAL | Status: DC
  Administered 2015-10-26: 50 mg via ORAL
  Filled 2015-10-25: qty 1

## 2015-10-25 MED ORDER — GLUCERNA SHAKE PO LIQD
237.0000 mL | ORAL | Status: DC
  Administered 2015-10-25 – 2015-10-28 (×3): 237 mL via ORAL

## 2015-10-25 MED ORDER — PRO-STAT SUGAR FREE PO LIQD
30.0000 mL | Freq: Three times a day (TID) | ORAL | Status: DC
Start: 2015-10-25 — End: 2015-10-29
  Administered 2015-10-25 – 2015-10-29 (×10): 30 mL via ORAL
  Filled 2015-10-25 (×10): qty 30

## 2015-10-25 MED ORDER — BISACODYL 10 MG RE SUPP
10.0000 mg | Freq: Once | RECTAL | Status: AC
  Administered 2015-10-25: 10 mg via RECTAL
  Filled 2015-10-25: qty 1

## 2015-10-25 MED ORDER — FERROUS SULFATE 325 (65 FE) MG PO TABS
325.0000 mg | ORAL_TABLET | Freq: Every day | ORAL | Status: DC
  Administered 2015-10-26 – 2015-10-29 (×4): 325 mg via ORAL
  Filled 2015-10-25 (×5): qty 1

## 2015-10-25 NOTE — Care Management Important Message (Signed)
Important Message  Patient Details  Name: [name] MRN: [id number] Date of Birth: 1940-03-26   Medicare Important Message Given:  Yes    Loann Quill 10/25/2015, 10:48 AM

## 2015-10-25 NOTE — Progress Notes (Addendum)
Nutrition Follow-up  DOCUMENTATION CODES:   Morbid obesity  INTERVENTION:  Continue 30 ml Pro-stat TID, each dose provides 15 grams of protein and 100 kcal.  Provide Glucerna Shake once daily, provides 220 kcal and 10 grams of protein Recommend changing diet to Heart Healthy/Carb Modified Recommend providing 500 mg of Vitamin C BID  NUTRITION DIAGNOSIS:   Increased nutrient needs related to wound healing (Acute exacerbations of chronic illnessess) as evidenced by estimated needs.  Ongoing  GOAL:   Patient will meet greater than or equal to 90% of their needs  Being met  MONITOR:   PO intake, Supplement acceptance, Labs, Weight trends  REASON FOR ASSESSMENT:   Consult Assessment of nutrition requirement/status  ASSESSMENT:   75 y.o. male with medical history significant of sCHF with EF 40-45%, hypertension, PVD, diabetes mellitus, hyperlipidemia, COPD, GERD, depression, anxiety, CAD, s/p of CABG, PE on Xarelto, stroke, BPH, who presents with shortness of breath.   Pt states that his appetite is fair and he is eating 75% of most meals. States that he is getting foods that he doesn't like. Per nursing notes, pt is eating 50 to 100% of meals. Pt states he is taking his supplements well. Encouraged pt to continue with supplements to support wound healing.   Labs: glucose ranging 115 to 275 mg/dL, low hemoglobin, low chloride, low calcium  Diet Order:  Diet renal/carb modified with fluid restriction Diet-HS Snack? Nothing; Room service appropriate? Yes; Fluid consistency: Thin; Fluid restriction: 1200 mL Fluid  Skin:  Wound (see comment) (stage III PU on R heel and leg, untsageable PU on L heel)  Last BM:  7/31  Height:   Ht Readings from Last 1 Encounters:  10/24/15 '5\' 8"'  (1.727 m)    Weight:   Wt Readings from Last 1 Encounters:  10/25/15 272 lb (123.4 kg)    Ideal Body Weight:  70 kg  BMI:  Body mass index is 41.36 kg/m.  Estimated Nutritional Needs:    Kcal:  1900-2100 (15-16 kcal/kg bw)  Protein:  105-120 g (1.5-1.7 g/kg ibw)  Fluid:  1.9-2.1 liters  EDUCATION NEEDS:   No education needs identified at this time  Scarlette Ar RD, LDN Inpatient Clinical Dietitian Pager: [phone] After Hours Pager: [phone]

## 2015-10-25 NOTE — Consult Note (Signed)
   [facility] CM Inpatient Consult   10/25/2015  [name] 06-Jul-1940 [id number]  Patient was discussed in progression meeting this morning.  Patient is a resident at a skilled nursing facility. Patient admitted with  acute on chronic combined systolic/diastolic congestive heart failure-he remains volume overloaded.  Will not pursue [place] [facility] at this time as he will return to his facility.  Came by the room but the patient was sound asleep and did not disturb.  For questions or further follow up, please contact:  Natividad Brood, RN BSN [facility] Liaison  [phone] business mobile phone Toll free office [phone]

## 2015-10-25 NOTE — Progress Notes (Signed)
    Subjective:  Denies CP; dyspnea improving  Objective:  Vitals:   10/24/15 2025 10/24/15 2054 10/25/15 0430 10/25/15 0434  BP:  (!) 103/53 (!) 95/39 (!) 99/41  Pulse:  71 (!) 57   Resp:  18 16   Temp:  98.1 F (36.7 C) 97.6 F (36.4 C)   TempSrc:  Oral Oral   SpO2: 95% 97% 96%   Weight:   272 lb (123.4 kg)   Height:        Intake/Output from previous day:  Intake/Output Summary (Last 24 hours) at 10/25/15 0850 Last data filed at 10/25/15 [id number]  Gross per 24 hour  Intake             1203 ml  Output             1875 ml  Net             -672 ml    Physical Exam: Physical exam: Well-developed obese in no acute distress; chronically ill appearing.  Skin is warm and dry.  HEENT is normal.  Neck is supple.  Chest CTA anteriorly Cardiovascular exam is regular rate and rhythm.  Abdominal exam nontender or distended. No masses palpated. + abdominal wall edema Extremities show 2+ edema. Lower ext wrapped neuro grossly intact    Lab Results: Basic Metabolic Panel:  Recent Labs  10/24/15 0533 10/25/15 0418  NA 136 138  K 4.3 3.9  CL 97* 96*  CO2 30 32  GLUCOSE 271* 148*  BUN 35* 34*  CREATININE 1.12 0.87  CALCIUM 8.5* 8.7*   CBC:  Recent Labs  10/24/15 0533 10/25/15 0418  WBC 12.5* 14.5*  HGB 8.8* 9.6*  HCT 31.7* 34.6*  MCV 70.3* 70.9*  PLT 317 343     Assessment/Plan:  1 acute on chronic combined systolic/diastolic congestive heart failure-he remains volume overloaded. I/O -972.  Weight 272. Continue lasix 80 BID. Follow renal function. 2 ischemic cardiomyopathy-continue carvedilol 3.125 mg twice a day. Advance as tolerated by blood pressure. Continue ACE inhibitor. Continue spironolactone 25 mg daily. Patient would not be a candidate for ICD. 3 peripheral vascular disease-continue statin. Management per vascular surgery. 4 coronary artery disease-continue statin. Not on aspirin as he is chronically anticoagulated. 5 hyperlipidemia-continue  statin. 6 COPD/URI-Antibiotics and pulmonary toilet per primary service.  Kirk Ruths 10/25/2015, 8:50 AM

## 2015-10-25 NOTE — Progress Notes (Signed)
PROGRESS NOTE  [name] [id number] DOB: [birth year] DOA: 10/19/2015 PCP: [name], MD  Brief History:  75 y.o.malewith medical history significant of sCHF with EF 40-45%, hypertension, PVD,diabetes mellitus, hyperlipidemia, COPD, GERD, depression, anxiety, CAD, s/p of CABG, PE on Xarelto, stroke, BPH, who presents with shortness of breath.   Patient reports that he has been having shortness of breath for nearly a month, which has been progressively getting worse. He also has cough with greenish colored sputum production. He has mild intermittent chest pain. It is pleuritic and is aggravated by coughing. No fever or chills. Patient denies nausea, vomiting, abdominal pain, diarrhea. He states that he has mild dysuria sometimes, but no burning or increased urinary frequency. Patient does not have unilateral weakness, rashes. He denies any rectal bleeding, dark stool or melena.   Assessment/Plan: Acute on chronic respiratory failure with hypoxia -Most likely due to combination of COPD and CHF exacerbation. -Treatment and plan below -Will ask RN to monitor O2 sats as patient states he does not use oxygen at SNF. -wean oxygen for sat >92%  Acute on chronic systolic  Heart failure -Echocardiogram 07/19/14 showed EF 40-45% -BNP 446, +JVD, LE edema upon admission -CXR mild pulm vascular congestion -NEG 3.9 L for the admission -NEG 10 lbs since admission -Echocardiogram EF [id number], garde 2 diastolic dysfunction  -Continue lisinopril  -remains clinically fluid overloaed -Cardiology consult appreciated, increased lasix to 80mg  BID, added spironolactone, and switched to coreg -d/c naprosyn  COPDexacerbation -Cough has been ongoing for 4 weeks per patient.  -Upon admission, patient had bilateral wheezing on auscultation -Nebulizers: scheduled Duoneb and prn albuterol nebs -finished 5 days azithromycin -wean solu-medrol-->prednisone -Continue mucinex -Respiratory  viral panel unremarkable   Bacteremia--contaminnt -Blood cultures from 10/20/2015 show 1/2 CoNS (likely contamination) -Repeat Blood cultures from 7/30 show no growth to date  Microcytic anemia -Basline hemoglobin 12-13 (waas 12.3 on 07/26/15) -Currently Hemoglobin 8.8 (was 8.7 upon admission) -INR 1.39 -FOBT negative -Anemia panel: iron sat 3 %, Iron 12, ferritin 38, B12 301 -Was given dose of ferrahem and B12 -start ferrous sulfate -Continue to monitor CBC closely  History of PE -CTA chest neg for PE  -Monitor respiratory status closely -Continue xarelto  Hyperlipidemia -Last LDL was 58 on 10/19/10 -Continue statin  Depression and anxiety -Continue Cymbalta and Ativan  GERD -Continue Protonix  Essential hypertension -Change Zebeta-->coreg  -lisinopril (withholding parameters)  BPH -Continue Flomax  Diabetes Mellitus, type II -Last A1c 7.4 -Continue NPH 20 units bid, ISS, CBG monitoring -will not change insulin as anticipate improvement in CBG with weaning steroids  PAD and chronic leg wound -Patient has been followed with [facility]. He was seen on 10/18/16 by NP, Barnabas Lister. Per clinic note, wound care center is now managing and evaluating the wounds in his lower legs; he has unna boots on both lower legs. Pt refused to allow the ultrasonographer to removed the unna boot compression dressings in his lower legs in order to perform ABI's. Per Dr. Bridgett Larsson, Pt is not a candidate for any further surgical interventions, need to focus on wound care in this patient primarily. -Wound care consulted  Upper extremity edema -Per patient, he did have an ultrasound of his arms done prior to admission.  -Only carotid doppler done: 1-39% stenosis of RICA, patent left carotid endarterectomy, no Left ICA stenosis -Upper ext doppler negative for DVT -Keep arms elevated.   Disposition Plan:   Home in  2-3 days  Family Communication:    Wife updated at bedside 8/3--Total time spent 35 minutes.  Greater than 50% spent face to face counseling and coordinating care.   Consultants:  Cardiology  Code Status:  DNR  DVT Prophylaxis:  Xarelto   Procedures: As Listed in Progress Note Above  Antibiotics: None    Subjective: Patient denies fevers, chills, headache, chest pain, dyspnea, nausea, vomiting, diarrhea, abdominal pain, dysuria, hematuria, hematochezia, and melena.   Objective: Vitals:   10/24/15 2054 10/25/15 0430 10/25/15 0434 10/25/15 1156  BP: (!) 103/53 (!) 95/39 (!) 99/41 (!) 107/55  Pulse: 71 (!) 57  79  Resp: 18 16  20   Temp: 98.1 F (36.7 C) 97.6 F (36.4 C)  98.2 F (36.8 C)  TempSrc: Oral Oral  Oral  SpO2: 97% 96%  91%  Weight:  123.4 kg (272 lb)    Height:        Intake/Output Summary (Last 24 hours) at 10/25/15 1814 Last data filed at 10/25/15 1351  Gross per 24 hour  Intake              963 ml  Output             3175 ml  Net            -2212 ml   Weight change: -0.907 kg (-2 lb) Exam:   General:  Pt is alert, follows commands appropriately, not in acute distress  HEENT: No icterus, No thrush, No neck mass, Gutierrez/AT  Cardiovascular: RRR, S1/S2, no rubs, no gallops  Respiratory: Bibasilar crackles. No wheezing. Good air movement  Abdomen: Soft/+BS, non tender, non distended, no guarding  Extremities: 1 + LE edema, No lymphangitis, No petechiae, No rashes, no synovitis   Data Reviewed: I have personally reviewed following labs and imaging studies Basic Metabolic Panel:  Recent Labs Lab 10/21/15 0708 10/22/15 0317 10/23/15 0442 10/24/15 0533 10/25/15 0418  NA 134* 136 133* 136 138  K 4.9 4.5 4.7 4.3 3.9  CL 98* 98* 96* 97* 96*  CO2 26 28 26 30  32  GLUCOSE 213* 221* 261* 271* 148*  BUN 27* 31* 34* 35* 34*  CREATININE 1.10 1.16 1.06 1.12 0.87  CALCIUM 8.3* 8.5* 8.5* 8.5* 8.7*   Liver Function Tests: No results for input(s): AST, ALT, ALKPHOS, BILITOT, PROT,  ALBUMIN in the last 168 hours. No results for input(s): LIPASE, AMYLASE in the last 168 hours. No results for input(s): AMMONIA in the last 168 hours. Coagulation Profile:  Recent Labs Lab 10/19/15 1833  INR 1.39   CBC:  Recent Labs Lab 10/19/15 1833  10/21/15 1006 10/22/15 0810 10/23/15 0442 10/24/15 0533 10/25/15 0418  WBC 13.8*  < > 15.3* 13.2* 12.0* 12.5* 14.5*  NEUTROABS 11.2*  --   --   --   --   --   --   HGB 8.7*  < > 9.0* 8.9* 8.8* 8.8* 9.6*  HCT 31.0*  < > 32.4* 32.0* 31.5* 31.7* 34.6*  MCV 70.9*  < > 70.7* 71.0* 70.0* 70.3* 70.9*  PLT 336  < > 298 282 285 317 343  < > = values in this interval not displayed. Cardiac Enzymes: No results for input(s): CKTOTAL, CKMB, CKMBINDEX, TROPONINI in the last 168 hours. BNP: Invalid input(s): POCBNP CBG:  Recent Labs Lab 10/24/15 1611 10/24/15 2112 10/25/15 0646 10/25/15 1125 10/25/15 1642  GLUCAP 275* 262* 115* 157* 171*   HbA1C: No results for input(s): HGBA1C in the last 72 hours. Urine  analysis:    Component Value Date/Time   COLORURINE YELLOW 10/20/2015 1655   APPEARANCEUR CLEAR 10/20/2015 1655   LABSPEC 1.018 10/20/2015 1655   PHURINE 6.0 10/20/2015 1655   GLUCOSEU NEGATIVE 10/20/2015 1655   HGBUR NEGATIVE 10/20/2015 1655   HGBUR negative 03/06/2010 1419   BILIRUBINUR NEGATIVE 10/20/2015 1655   KETONESUR NEGATIVE 10/20/2015 1655   PROTEINUR NEGATIVE 10/20/2015 1655   UROBILINOGEN 0.2 09/13/2014 0945   NITRITE NEGATIVE 10/20/2015 1655   LEUKOCYTESUR NEGATIVE 10/20/2015 1655   Sepsis Labs: @LABRCNTIP (procalcitonin:4,lacticidven:4) ) Recent Results (from the past 240 hour(s))  Respiratory Panel by PCR     Status: None   Collection Time: 10/20/15  2:12 AM  Result Value Ref Range Status   Adenovirus NOT DETECTED NOT DETECTED Final   Coronavirus 229E NOT DETECTED NOT DETECTED Final   Coronavirus HKU1 NOT DETECTED NOT DETECTED Final   Coronavirus NL63 NOT DETECTED NOT DETECTED Final   Coronavirus  OC43 NOT DETECTED NOT DETECTED Final   Metapneumovirus NOT DETECTED NOT DETECTED Final   Rhinovirus / Enterovirus NOT DETECTED NOT DETECTED Final   Influenza A NOT DETECTED NOT DETECTED Final   Influenza A H1 NOT DETECTED NOT DETECTED Final   Influenza A H1 2009 NOT DETECTED NOT DETECTED Final   Influenza A H3 NOT DETECTED NOT DETECTED Final   Influenza B NOT DETECTED NOT DETECTED Final   Parainfluenza Virus 1 NOT DETECTED NOT DETECTED Final   Parainfluenza Virus 2 NOT DETECTED NOT DETECTED Final   Parainfluenza Virus 3 NOT DETECTED NOT DETECTED Final   Parainfluenza Virus 4 NOT DETECTED NOT DETECTED Final   Respiratory Syncytial Virus NOT DETECTED NOT DETECTED Final   Bordetella pertussis NOT DETECTED NOT DETECTED Final   Chlamydophila pneumoniae NOT DETECTED NOT DETECTED Final   Mycoplasma pneumoniae NOT DETECTED NOT DETECTED Final  Culture, blood (Routine X 2) w Reflex to ID Panel     Status: Abnormal   Collection Time: 10/20/15  6:15 AM  Result Value Ref Range Status   Specimen Description BLOOD RIGHT HAND  Final   Special Requests IN PEDIATRIC BOTTLE 2ML  Final   Culture  Setup Time   Final    GRAM POSITIVE COCCI IN CLUSTERS IN PEDIATRIC BOTTLE CRITICAL RESULT CALLED TO, READ BACK BY AND VERIFIED WITH: TO KAMEND(PHARD) BY TCLEVELAND 10/21/2015 AT 5:44AM    Culture (A)  Final    STAPHYLOCOCCUS SPECIES (COAGULASE NEGATIVE) THE SIGNIFICANCE OF ISOLATING THIS ORGANISM FROM A SINGLE SET OF BLOOD CULTURES WHEN MULTIPLE SETS ARE DRAWN IS UNCERTAIN. PLEASE NOTIFY THE MICROBIOLOGY DEPARTMENT WITHIN ONE WEEK IF SPECIATION AND SENSITIVITIES ARE REQUIRED.    Report Status 10/23/2015 FINAL  Final  Blood Culture ID Panel (Reflexed)     Status: Abnormal   Collection Time: 10/20/15  6:15 AM  Result Value Ref Range Status   Enterococcus species NOT DETECTED NOT DETECTED Final   Vancomycin resistance NOT DETECTED NOT DETECTED Final   Listeria monocytogenes NOT DETECTED NOT DETECTED Final    Staphylococcus species DETECTED (A) NOT DETECTED Final    Comment: CRITICAL RESULT CALLED TO, READ BACK BY AND VERIFIED WITH: TO KAMENED(PHARD) BY TCLEVELAND 10/21/2015 AT 5:44AM    Staphylococcus aureus NOT DETECTED NOT DETECTED Final   Methicillin resistance NOT DETECTED NOT DETECTED Final   Streptococcus species NOT DETECTED NOT DETECTED Final   Streptococcus agalactiae NOT DETECTED NOT DETECTED Final   Streptococcus pneumoniae NOT DETECTED NOT DETECTED Final   Streptococcus pyogenes NOT DETECTED NOT DETECTED Final   Acinetobacter baumannii NOT  DETECTED NOT DETECTED Final   Enterobacteriaceae species NOT DETECTED NOT DETECTED Final   Enterobacter cloacae complex NOT DETECTED NOT DETECTED Final   Escherichia coli NOT DETECTED NOT DETECTED Final   Klebsiella oxytoca NOT DETECTED NOT DETECTED Final   Klebsiella pneumoniae NOT DETECTED NOT DETECTED Final   Proteus species NOT DETECTED NOT DETECTED Final   Serratia marcescens NOT DETECTED NOT DETECTED Final   Carbapenem resistance NOT DETECTED NOT DETECTED Final   Haemophilus influenzae NOT DETECTED NOT DETECTED Final   Neisseria meningitidis NOT DETECTED NOT DETECTED Final   Pseudomonas aeruginosa NOT DETECTED NOT DETECTED Final   Candida albicans NOT DETECTED NOT DETECTED Final   Candida glabrata NOT DETECTED NOT DETECTED Final   Candida krusei NOT DETECTED NOT DETECTED Final   Candida parapsilosis NOT DETECTED NOT DETECTED Final   Candida tropicalis NOT DETECTED NOT DETECTED Final  Culture, blood (Routine X 2) w Reflex to ID Panel     Status: None   Collection Time: 10/20/15  6:18 AM  Result Value Ref Range Status   Specimen Description BLOOD RIGHT ARM  Final   Special Requests IN PEDIATRIC BOTTLE 1ML  Final   Culture NO GROWTH 5 DAYS  Final   Report Status 10/25/2015 FINAL  Final  MRSA PCR Screening     Status: None   Collection Time: 10/20/15  8:44 AM  Result Value Ref Range Status   MRSA by PCR NEGATIVE NEGATIVE Final     Comment:        The GeneXpert MRSA Assay (FDA approved for NASAL specimens only), is one component of a comprehensive MRSA colonization surveillance program. It is not intended to diagnose MRSA infection nor to guide or monitor treatment for MRSA infections.   Culture, blood (routine x 2)     Status: None (Preliminary result)   Collection Time: 10/21/15 10:17 AM  Result Value Ref Range Status   Specimen Description BLOOD RIGHT HAND  Final   Special Requests IN PEDIATRIC BOTTLE .5CC  Final   Culture NO GROWTH 4 DAYS  Final   Report Status PENDING  Incomplete  Culture, blood (routine x 2)     Status: None (Preliminary result)   Collection Time: 10/21/15 10:24 AM  Result Value Ref Range Status   Specimen Description BLOOD RIGHT HAND  Final   Special Requests IN PEDIATRIC BOTTLE  .5CC  Final   Culture NO GROWTH 4 DAYS  Final   Report Status PENDING  Incomplete     Scheduled Meds: . atorvastatin  40 mg Oral q1800  . benzonatate  100 mg Oral TID  . carvedilol  3.125 mg Oral BID WC  . collagenase  1 application Topical Daily  . dextromethorphan-guaiFENesin  1 tablet Oral BID  . docusate sodium  100 mg Oral BID  . DULoxetine  90 mg Oral Daily  . feeding supplement (GLUCERNA SHAKE)  237 mL Oral Q24H  . feeding supplement (PRO-STAT SUGAR FREE 64)  30 mL Oral TID WC  . furosemide  80 mg Intravenous BID  . gabapentin  400 mg Oral TID  . insulin aspart  0-9 Units Subcutaneous TID WC  . insulin aspart  4 Units Subcutaneous TID WC  . insulin NPH Human  20 Units Subcutaneous BID AC & HS  . ipratropium-albuterol  3 mL Nebulization TID  . lidocaine  1 patch Transdermal Q24H  . lisinopril  2.5 mg Oral Daily  . LORazepam  0.25 mg Oral QHS  . lubiprostone  8 mcg Oral BID  WC  . multivitamin  1 tablet Oral Daily  . naloxegol oxalate  25 mg Oral Daily  . pantoprazole  40 mg Oral Daily  . polyvinyl alcohol  1 drop Both Eyes BID  . [START ON 10/26/2015] predniSONE  50 mg Oral Q breakfast  .  rivaroxaban  20 mg Oral Q supper  . sodium chloride flush  3 mL Intravenous Q12H  . spironolactone  25 mg Oral Daily  . tamsulosin  0.4 mg Oral QPC supper  . traZODone  100 mg Oral QHS  . Vitamin D (Ergocalciferol)  50,000 Units Oral Q Wed   Continuous Infusions:   Procedures/Studies: Dg Chest 2 View  Result Date: 10/19/2015 CLINICAL DATA:  Productive cough. EXAM: CHEST  2 VIEW COMPARISON:  08/24/2014 FINDINGS: Postsurgical changes from CABG is stable. Cardiomediastinal silhouette is mildly enlarged. Mediastinal contours appear intact. There is no evidence of focal airspace consolidation, pleural effusion or pneumothorax. Mild pulmonary vascular congestion. Osseous structures are without acute abnormality. Soft tissues are grossly normal. IMPRESSION: Mildly enlarged cardiac silhouette. Mild pulmonary vascular congestion. Electronically Signed   By: Fidela Salisbury M.D.   On: 10/19/2015 17:29  Ct Angio Chest Pe W And/or Wo Contrast  Result Date: 10/19/2015 CLINICAL DATA:  Shortness of Breath with productive cough EXAM: CT ANGIOGRAPHY CHEST WITH CONTRAST TECHNIQUE: Multidetector CT imaging of the chest was performed using the standard protocol during bolus administration of intravenous contrast. Multiplanar CT image reconstructions and MIPs were obtained to evaluate the vascular anatomy. CONTRAST:  80 mL Isovue 370. COMPARISON:  None. FINDINGS: Mediastinum/Lymph Nodes: The thoracic inlet is within normal limits. No significant hilar or mediastinal adenopathy is noted. Cardiovascular: Pulmonary artery demonstrates a normal branching pattern. No findings to suggest pulmonary emboli are seen. Aortic calcifications are noted without aneurysmal dilatation or dissection. Changes consistent with coronary bypass grafting are noted. Multiple coronary stents are noted within the bypass grafts. Native coronary calcifications are seen. No right heart strain is noted. No pericardial effusion is seen.  Lungs/Pleura: Bilateral pleural effusions right greater than left are seen. Bilateral dependent atelectatic changes are noted. No significant pulmonary edema is noted. Upper abdomen: Visualized upper abdomen is within normal limits. Musculoskeletal: Degenerative changes of the thoracic spine are noted. Review of the MIP images confirms the above findings. IMPRESSION: No evidence of pulmonary emboli. Findings of prior coronary bypass grafting with stenting. No acute abnormality noted. Electronically Signed   By: Inez Catalina M.D.   On: 10/19/2015 21:41   Sanjay Broadfoot, DO  [facility] Pager [phone]  If 7PM-7AM, please contact night-coverage [email or web address] Password [email or web address] 10/25/2015, 6:14 PM   LOS: 6 days

## 2015-10-26 DIAGNOSIS — D509 Iron deficiency anemia, unspecified: Secondary | ICD-10-CM

## 2015-10-26 DIAGNOSIS — I10 Essential (primary) hypertension: Secondary | ICD-10-CM

## 2015-10-26 DIAGNOSIS — F329 Major depressive disorder, single episode, unspecified: Secondary | ICD-10-CM

## 2015-10-26 LAB — GLUCOSE, CAPILLARY
GLUCOSE-CAPILLARY: 87 mg/dL (ref 65–99)
GLUCOSE-CAPILLARY: 176 mg/dL — AB (ref 65–99)
Glucose-Capillary: 158 mg/dL — ABNORMAL HIGH (ref 65–99)
Glucose-Capillary: 179 mg/dL — ABNORMAL HIGH (ref 65–99)
Glucose-Capillary: 113 mg/dL — ABNORMAL HIGH (ref 65–99)

## 2015-10-26 LAB — CULTURE, BLOOD (ROUTINE X 2)
CULTURE: NO GROWTH
Culture: NO GROWTH

## 2015-10-26 LAB — BASIC METABOLIC PANEL
ANION GAP: 11 (ref 5–15)
BUN: 34 mg/dL — ABNORMAL HIGH (ref 6–20)
CHLORIDE: 91 mmol/L — AB (ref 101–111)
CO2: 37 mmol/L — AB (ref 22–32)
Calcium: 9 mg/dL (ref 8.9–10.3)
Creatinine, Ser: 0.98 mg/dL (ref 0.61–1.24)
GFR calc non Af Amer: >60 mL/min (ref >60)
Glucose, Bld: 121 mg/dL — ABNORMAL HIGH (ref 65–99)
POTASSIUM: 3.8 mmol/L (ref 3.5–5.1)
Sodium: 139 mmol/L (ref 135–145)

## 2015-10-26 LAB — OCCULT BLOOD X 1 CARD TO LAB, STOOL: Fecal Occult Bld: NEGATIVE

## 2015-10-26 MED ORDER — PREDNISONE 20 MG PO TABS
40.0000 mg | ORAL_TABLET | Freq: Every day | ORAL | Status: DC
  Administered 2015-10-27: 40 mg via ORAL
  Filled 2015-10-26: qty 2

## 2015-10-26 NOTE — Progress Notes (Signed)
PROGRESS NOTE  [name] [id number] DOB: Feb 27, 1941 DOA: 10/19/2015 PCP: [name], MD  Brief History:  75 y.o.malewith medical history significant of sCHF with EF 40-45%, hypertension, PVD,diabetes mellitus, hyperlipidemia, COPD, GERD, depression, anxiety, CAD, s/p of CABG, PE on Xarelto, stroke, BPH, who presents with shortness of breath.   Patient reports that he has been having shortness of breath for nearly a month, which has been progressively getting worse. He also has cough with greenish colored sputum production. He also c/o mild intermittent chest pain. It is pleuritic and is aggravated by coughing. No fever or chills. Patient denies nausea, vomiting, abdominal pain, diarrhea. He states that he has mild dysuria sometimes, but no burning or increased urinary frequency. Patient does not have unilateral weakness, rashes. CXR showed pulmonary edema and pt was clinically fluid overloaded.  He was started on IV lasix which was increased to 80 mg bid IVwith good clinical effect Assessment/Plan:  Acute on chronic respiratory failure with hypoxia -Most likely due to combination of COPD and CHF exacerbation. -Treatment and plan below -Will ask RN to monitor O2 sats as patient states he does not use oxygen at SNF. -wean oxygen for sat >92%  Acute on chronic systolic Heart failure -Echocardiogram 07/19/14 showed EF 40-45% -BNP 446, +JVD, LE edema upon admission -10/19/15 CXR mild pulm vascular congestion -NEG 5 L for the admission -NEG 11 lbs since admission -Echocardiogram EF [id number], garde 2 diastolic dysfunction  -Continue lisinopril, coreg, aldactone -remains clinically fluid overloaed -Cardiology consult appreciated, increased lasix to 80mg  BID, added spironolactone, and switched to coreg -d/c naprosyn  COPDexacerbation -Cough has been ongoing for 4 weeks per patient.  -Upon admission, patient had bilateral wheezing on auscultation -Nebulizers: scheduled  Duoneb and prn albuterol nebs -finished 5 days azithromycin -wean solu-medrol-->prednisone 40 mg  -Continue mucinex -Respiratory viral panel unremarkable   Bacteremia--contaminnt -Blood cultures from 10/20/2015 show 1/2 CoNS (likely contamination) -Repeat Blood cultures from 7/30 show no growth to date  Microcytic anemia -Basline hemoglobin 12-13 (waas 12.3 on 07/26/15) -Currently Hemoglobin 8.8 (was 8.7 upon admission) -INR 1.39 -FOBT negative -Anemia panel: iron sat 3 %, Iron 12, ferritin 38, B12 301 -Was given dose of ferrahem and B12 -start ferrous sulfate -Continue to monitor CBC closely  History of PE -CTA chest neg for PE  -Monitor respiratory status closely -Continuexarelto  Hyperlipidemia -Last LDL was 58 on 10/19/10 -Continue statin  Depression and anxiety -Continue Cymbalta and Ativan  GERD -Continue Protonix  Essential hypertension -Change Zebeta-->coreg  -lisinopril (withholding parameters)  BPH -Continue Flomax  Diabetes Mellitus, type II -Last A1c 7.4 -Continue NPH 20 units bid, ISS, CBG monitoring -will not change insulin as anticipate improvement in CBG with weaning steroids  PAD and chronic leg wound -Patient has been followed with [facility]. He was seen on 10/18/16 by NP, Barnabas Lister. Per clinic note, wound care center is now managing and evaluating the wounds in his lower legs; he has unna boots on both lower legs. Pt refused to allow the ultrasonographer to removed the unna boot compression dressings in his lower legs in order to perform ABI's. Per Dr. Bridgett Larsson, Pt is not a candidate for any further surgical interventions, need to focus on wound care in this patient primarily. -Wound care consulted  Upper extremity edema -Per patient, he did have an ultrasound of his arms done prior to admission.  -Only carotid doppler done: 1-39% stenosis of RICA, patent left carotid  endarterectomy, no Left ICA  stenosis -Upper ext doppler negative for DVT -Keep arms elevated.   Disposition Plan:   Home in 2-3 days  Family Communication:   Wife updated at bedside 8/4   Consultants:  Cardiology  Code Status:  DNR  DVT Prophylaxis:  Xarelto   Procedures: As Listed in Progress Note Above  Antibiotics: None          Subjective: Pt c/o pain all over--neck, arms, legs.  Breathing better but still dyspnea with exertion.  No cp, nv/d.  +BM.  No abd pain, dysuria  Objective: Vitals:   10/25/15 1937 10/25/15 2245 10/26/15 0519 10/26/15 1136  BP:  100/62 (!) 115/49 (!) 104/47  Pulse:  64 60 85  Resp:  20  18  Temp:  98.7 F (37.1 C) 98 F (36.7 C) 98.5 F (36.9 C)  TempSrc:  Oral Oral Oral  SpO2: 92% 94% 96% 90%  Weight:   122.9 kg (271 lb)   Height:        Intake/Output Summary (Last 24 hours) at 10/26/15 1347 Last data filed at 10/26/15 1136  Gross per 24 hour  Intake              360 ml  Output             2200 ml  Net            -1840 ml   Weight change: -0.454 kg (-1 lb) Exam:   General:  Pt is alert, follows commands appropriately, not in acute distress  HEENT: No icterus, No thrush, No neck mass, [place]/AT  Cardiovascular: RRR, S1/S2, no rubs, no gallops  Respiratory:bibasilar crackles, no wheeze  Abdomen: Soft/+BS, non tender, non distended, no guarding  Extremities: 1 + LE edema, No lymphangitis, No petechiae, No rashes, no synovitis   Data Reviewed: I have personally reviewed following labs and imaging studies Basic Metabolic Panel:  Recent Labs Lab 10/22/15 0317 10/23/15 0442 10/24/15 0533 10/25/15 0418 10/26/15 0305  NA 136 133* 136 138 139  K 4.5 4.7 4.3 3.9 3.8  CL 98* 96* 97* 96* 91*  CO2 28 26 30  32 37*  GLUCOSE 221* 261* 271* 148* 121*  BUN 31* 34* 35* 34* 34*  CREATININE 1.16 1.06 1.12 0.87 0.98  CALCIUM 8.5* 8.5* 8.5* 8.7* 9.0   Liver Function Tests: No results for input(s): AST, ALT, ALKPHOS, BILITOT, PROT, ALBUMIN in  the last 168 hours. No results for input(s): LIPASE, AMYLASE in the last 168 hours. No results for input(s): AMMONIA in the last 168 hours. Coagulation Profile:  Recent Labs Lab 10/19/15 1833  INR 1.39   CBC:  Recent Labs Lab 10/19/15 1833  10/21/15 1006 10/22/15 0810 10/23/15 0442 10/24/15 0533 10/25/15 0418  WBC 13.8*  < > 15.3* 13.2* 12.0* 12.5* 14.5*  NEUTROABS 11.2*  --   --   --   --   --   --   HGB 8.7*  < > 9.0* 8.9* 8.8* 8.8* 9.6*  HCT 31.0*  < > 32.4* 32.0* 31.5* 31.7* 34.6*  MCV 70.9*  < > 70.7* 71.0* 70.0* 70.3* 70.9*  PLT 336  < > 298 282 285 317 343  < > = values in this interval not displayed. Cardiac Enzymes: No results for input(s): CKTOTAL, CKMB, CKMBINDEX, TROPONINI in the last 168 hours. BNP: Invalid input(s): POCBNP CBG:  Recent Labs Lab 10/25/15 1642 10/25/15 2159 10/25/15 2243 10/26/15 0553 10/26/15 1130  GLUCAP 171* 147* 158* 87 176*  HbA1C: No results for input(s): HGBA1C in the last 72 hours. Urine analysis:    Component Value Date/Time   COLORURINE YELLOW 10/20/2015 [place] 10/20/2015 1655   LABSPEC 1.018 10/20/2015 1655   PHURINE 6.0 10/20/2015 1655   GLUCOSEU NEGATIVE 10/20/2015 1655   HGBUR NEGATIVE 10/20/2015 1655   HGBUR negative 03/06/2010 1419   BILIRUBINUR NEGATIVE 10/20/2015 1655   KETONESUR NEGATIVE 10/20/2015 1655   PROTEINUR NEGATIVE 10/20/2015 1655   UROBILINOGEN 0.2 09/13/2014 0945   NITRITE NEGATIVE 10/20/2015 1655   LEUKOCYTESUR NEGATIVE 10/20/2015 1655   Sepsis Labs: @LABRCNTIP (procalcitonin:4,lacticidven:4) ) Recent Results (from the past 240 hour(s))  Respiratory Panel by PCR     Status: None   Collection Time: 10/20/15  2:12 AM  Result Value Ref Range Status   Adenovirus NOT DETECTED NOT DETECTED Final   Coronavirus 229E NOT DETECTED NOT DETECTED Final   Coronavirus HKU1 NOT DETECTED NOT DETECTED Final   Coronavirus NL63 NOT DETECTED NOT DETECTED Final   Coronavirus OC43 NOT  DETECTED NOT DETECTED Final   Metapneumovirus NOT DETECTED NOT DETECTED Final   Rhinovirus / Enterovirus NOT DETECTED NOT DETECTED Final   Influenza A NOT DETECTED NOT DETECTED Final   Influenza A H1 NOT DETECTED NOT DETECTED Final   Influenza A H1 2009 NOT DETECTED NOT DETECTED Final   Influenza A H3 NOT DETECTED NOT DETECTED Final   Influenza B NOT DETECTED NOT DETECTED Final   Parainfluenza Virus 1 NOT DETECTED NOT DETECTED Final   Parainfluenza Virus 2 NOT DETECTED NOT DETECTED Final   Parainfluenza Virus 3 NOT DETECTED NOT DETECTED Final   Parainfluenza Virus 4 NOT DETECTED NOT DETECTED Final   Respiratory Syncytial Virus NOT DETECTED NOT DETECTED Final   Bordetella pertussis NOT DETECTED NOT DETECTED Final   Chlamydophila pneumoniae NOT DETECTED NOT DETECTED Final   Mycoplasma pneumoniae NOT DETECTED NOT DETECTED Final  Culture, blood (Routine X 2) w Reflex to ID Panel     Status: Abnormal   Collection Time: 10/20/15  6:15 AM  Result Value Ref Range Status   Specimen Description BLOOD RIGHT HAND  Final   Special Requests IN PEDIATRIC BOTTLE 2ML  Final   Culture  Setup Time   Final    GRAM POSITIVE COCCI IN CLUSTERS IN PEDIATRIC BOTTLE CRITICAL RESULT CALLED TO, READ BACK BY AND VERIFIED WITH: TO KAMEND(PHARD) BY TCLEVELAND 10/21/2015 AT 5:44AM    Culture (A)  Final    STAPHYLOCOCCUS SPECIES (COAGULASE NEGATIVE) THE SIGNIFICANCE OF ISOLATING THIS ORGANISM FROM A SINGLE SET OF BLOOD CULTURES WHEN MULTIPLE SETS ARE DRAWN IS UNCERTAIN. PLEASE NOTIFY THE MICROBIOLOGY DEPARTMENT WITHIN ONE WEEK IF SPECIATION AND SENSITIVITIES ARE REQUIRED.    Report Status 10/23/2015 FINAL  Final  Blood Culture ID Panel (Reflexed)     Status: Abnormal   Collection Time: 10/20/15  6:15 AM  Result Value Ref Range Status   Enterococcus species NOT DETECTED NOT DETECTED Final   Vancomycin resistance NOT DETECTED NOT DETECTED Final   Listeria monocytogenes NOT DETECTED NOT DETECTED Final    Staphylococcus species DETECTED (A) NOT DETECTED Final    Comment: CRITICAL RESULT CALLED TO, READ BACK BY AND VERIFIED WITH: TO KAMENED(PHARD) BY TCLEVELAND 10/21/2015 AT 5:44AM    Staphylococcus aureus NOT DETECTED NOT DETECTED Final   Methicillin resistance NOT DETECTED NOT DETECTED Final   Streptococcus species NOT DETECTED NOT DETECTED Final   Streptococcus agalactiae NOT DETECTED NOT DETECTED Final   Streptococcus pneumoniae NOT DETECTED NOT DETECTED Final  Streptococcus pyogenes NOT DETECTED NOT DETECTED Final   Acinetobacter baumannii NOT DETECTED NOT DETECTED Final   Enterobacteriaceae species NOT DETECTED NOT DETECTED Final   Enterobacter cloacae complex NOT DETECTED NOT DETECTED Final   Escherichia coli NOT DETECTED NOT DETECTED Final   Klebsiella oxytoca NOT DETECTED NOT DETECTED Final   Klebsiella pneumoniae NOT DETECTED NOT DETECTED Final   Proteus species NOT DETECTED NOT DETECTED Final   Serratia marcescens NOT DETECTED NOT DETECTED Final   Carbapenem resistance NOT DETECTED NOT DETECTED Final   Haemophilus influenzae NOT DETECTED NOT DETECTED Final   Neisseria meningitidis NOT DETECTED NOT DETECTED Final   Pseudomonas aeruginosa NOT DETECTED NOT DETECTED Final   Candida albicans NOT DETECTED NOT DETECTED Final   Candida glabrata NOT DETECTED NOT DETECTED Final   Candida krusei NOT DETECTED NOT DETECTED Final   Candida parapsilosis NOT DETECTED NOT DETECTED Final   Candida tropicalis NOT DETECTED NOT DETECTED Final  Culture, blood (Routine X 2) w Reflex to ID Panel     Status: None   Collection Time: 10/20/15  6:18 AM  Result Value Ref Range Status   Specimen Description BLOOD RIGHT ARM  Final   Special Requests IN PEDIATRIC BOTTLE 1ML  Final   Culture NO GROWTH 5 DAYS  Final   Report Status 10/25/2015 FINAL  Final  MRSA PCR Screening     Status: None   Collection Time: 10/20/15  8:44 AM  Result Value Ref Range Status   MRSA by PCR NEGATIVE NEGATIVE Final     Comment:        The GeneXpert MRSA Assay (FDA approved for NASAL specimens only), is one component of a comprehensive MRSA colonization surveillance program. It is not intended to diagnose MRSA infection nor to guide or monitor treatment for MRSA infections.   Culture, blood (routine x 2)     Status: None   Collection Time: 10/21/15 10:17 AM  Result Value Ref Range Status   Specimen Description BLOOD RIGHT HAND  Final   Special Requests IN PEDIATRIC BOTTLE .5CC  Final   Culture NO GROWTH 5 DAYS  Final   Report Status 10/26/2015 FINAL  Final  Culture, blood (routine x 2)     Status: None   Collection Time: 10/21/15 10:24 AM  Result Value Ref Range Status   Specimen Description BLOOD RIGHT HAND  Final   Special Requests IN PEDIATRIC BOTTLE  .5CC  Final   Culture NO GROWTH 5 DAYS  Final   Report Status 10/26/2015 FINAL  Final     Scheduled Meds: . atorvastatin  40 mg Oral q1800  . benzonatate  100 mg Oral TID  . carvedilol  3.125 mg Oral BID WC  . dextromethorphan-guaiFENesin  1 tablet Oral BID  . docusate sodium  100 mg Oral BID  . DULoxetine  90 mg Oral Daily  . feeding supplement (GLUCERNA SHAKE)  237 mL Oral Q24H  . feeding supplement (PRO-STAT SUGAR FREE 64)  30 mL Oral TID WC  . ferrous sulfate  325 mg Oral Q breakfast  . furosemide  80 mg Intravenous BID  . gabapentin  400 mg Oral TID  . insulin aspart  0-9 Units Subcutaneous TID WC  . insulin aspart  4 Units Subcutaneous TID WC  . insulin NPH Human  20 Units Subcutaneous BID AC & HS  . ipratropium-albuterol  3 mL Nebulization TID  . lidocaine  1 patch Transdermal Q24H  . lisinopril  2.5 mg Oral Daily  . LORazepam  0.25 mg Oral QHS  . lubiprostone  8 mcg Oral BID WC  . multivitamin  1 tablet Oral Daily  . naloxegol oxalate  25 mg Oral Daily  . pantoprazole  40 mg Oral Daily  . polyvinyl alcohol  1 drop Both Eyes BID  . predniSONE  50 mg Oral Q breakfast  . rivaroxaban  20 mg Oral Q supper  . sodium chloride  flush  3 mL Intravenous Q12H  . spironolactone  25 mg Oral Daily  . tamsulosin  0.4 mg Oral QPC supper  . traZODone  100 mg Oral QHS  . Vitamin D (Ergocalciferol)  50,000 Units Oral Q Wed   Continuous Infusions:   Procedures/Studies: Dg Chest 2 View  Result Date: 10/19/2015 CLINICAL DATA:  Productive cough. EXAM: CHEST  2 VIEW COMPARISON:  08/24/2014 FINDINGS: Postsurgical changes from CABG is stable. Cardiomediastinal silhouette is mildly enlarged. Mediastinal contours appear intact. There is no evidence of focal airspace consolidation, pleural effusion or pneumothorax. Mild pulmonary vascular congestion. Osseous structures are without acute abnormality. Soft tissues are grossly normal. IMPRESSION: Mildly enlarged cardiac silhouette. Mild pulmonary vascular congestion. Electronically Signed   By: Fidela Salisbury M.D.   On: 10/19/2015 17:29  Ct Angio Chest Pe W And/or Wo Contrast  Result Date: 10/19/2015 CLINICAL DATA:  Shortness of Breath with productive cough EXAM: CT ANGIOGRAPHY CHEST WITH CONTRAST TECHNIQUE: Multidetector CT imaging of the chest was performed using the standard protocol during bolus administration of intravenous contrast. Multiplanar CT image reconstructions and MIPs were obtained to evaluate the vascular anatomy. CONTRAST:  80 mL Isovue 370. COMPARISON:  None. FINDINGS: Mediastinum/Lymph Nodes: The thoracic inlet is within normal limits. No significant hilar or mediastinal adenopathy is noted. Cardiovascular: Pulmonary artery demonstrates a normal branching pattern. No findings to suggest pulmonary emboli are seen. Aortic calcifications are noted without aneurysmal dilatation or dissection. Changes consistent with coronary bypass grafting are noted. Multiple coronary stents are noted within the bypass grafts. Native coronary calcifications are seen. No right heart strain is noted. No pericardial effusion is seen. Lungs/Pleura: Bilateral pleural effusions right greater than  left are seen. Bilateral dependent atelectatic changes are noted. No significant pulmonary edema is noted. Upper abdomen: Visualized upper abdomen is within normal limits. Musculoskeletal: Degenerative changes of the thoracic spine are noted. Review of the MIP images confirms the above findings. IMPRESSION: No evidence of pulmonary emboli. Findings of prior coronary bypass grafting with stenting. No acute abnormality noted. Electronically Signed   By: Inez Catalina M.D.   On: 10/19/2015 21:41   Derel Mcglasson, DO  [facility] Pager [phone]  If 7PM-7AM, please contact night-coverage [email or web address] Password [email or web address] 10/26/2015, 1:47 PM   LOS: 7 days

## 2015-10-26 NOTE — Progress Notes (Signed)
    Subjective:  Denies CP; dyspnea improving  Objective:  Vitals:   10/25/15 1156 10/25/15 1937 10/25/15 2245 10/26/15 0519  BP: (!) 107/55  100/62 (!) 115/49  Pulse: 79  64 60  Resp: 20  20   Temp: 98.2 F (36.8 C)  98.7 F (37.1 C) 98 F (36.7 C)  TempSrc: Oral  Oral Oral  SpO2: 91% 92% 94% 96%  Weight:    271 lb (122.9 kg)  Height:        Intake/Output from previous day:  Intake/Output Summary (Last 24 hours) at 10/26/15 [id number] Last data filed at 10/26/15 0518  Gross per 24 hour  Intake              243 ml  Output             3050 ml  Net            -2807 ml    Physical Exam: Physical exam: Well-developed obese in no acute distress; chronically ill appearing.  Skin is warm and dry.  HEENT is normal.  Neck is supple.  Chest CTA anteriorly Cardiovascular exam is regular rate and rhythm.  Abdominal exam nontender or distended. No masses palpated. + abdominal wall edema Extremities show 2+ edema. Lower ext wrapped neuro grossly intact    Lab Results: Basic Metabolic Panel:  Recent Labs  10/25/15 0418 10/26/15 0305  NA 138 139  K 3.9 3.8  CL 96* 91*  CO2 32 37*  GLUCOSE 148* 121*  BUN 34* 34*  CREATININE 0.87 0.98  CALCIUM 8.7* 9.0   CBC:  Recent Labs  10/24/15 0533 10/25/15 0418  WBC 12.5* 14.5*  HGB 8.8* 9.6*  HCT 31.7* 34.6*  MCV 70.3* 70.9*  PLT 317 343     Assessment/Plan:  1 acute on chronic combined systolic/diastolic congestive heart failure-he remains volume overloaded. I/O -2567.  Weight 271. Continue lasix 80 BID. Follow renal function. 2 ischemic cardiomyopathy-continue carvedilol 3.125 mg twice a day. Advance as tolerated by blood pressure. Continue ACE inhibitor. Continue spironolactone 25 mg daily. Patient would not be a candidate for ICD. 3 peripheral vascular disease-continue statin. Management per vascular surgery. 4 coronary artery disease-continue statin. Not on aspirin as he is chronically anticoagulated. 5  hyperlipidemia-continue statin. 6 COPD/URI-Antibiotics and pulmonary toilet per primary service.  Kirk Ruths 10/26/2015, 9:52 AM

## 2015-10-26 NOTE — Progress Notes (Signed)
Pt slept on and off overnight, tab Norco provided for the one time complain of rt shoulder pain, suppository provided for c/o constipation, pt has a loose one episode of BM this am, unable to collect it, he was incontinent, no any other complaints of SOB and distress, will continue to monitor the patient.

## 2015-10-26 NOTE — Clinical Social Work Note (Signed)
CSW continues to follow for discharge needs and updating SNF. Plan is still for [facility]. Patient might discharge over the weekend.  Dayton Scrape, [place]

## 2015-10-26 NOTE — Progress Notes (Signed)
Physical Therapy Treatment Patient Details Name: [name] MRN: [id number] DOB: 1940-12-12 Today's Date: 10/26/2015    History of Present Illness [name] is a 75 y.o. male with medical history significant of sCHF, hypertension, PVD, diabetes mellitus, hyperlipidemia, COPD, GERD, depression, anxiety, CAD, s/p of CABG, PE on Xarelto, stroke, BPH, who presents with shortness of breath.  Admitted with Acute on chronic respiratory failure with hypoxia    PT Comments    Patient with incr Rt knee pain prior to initiating PT. Patient unable to achieve standing today, even with bed elevated and use of Denna Haggard.   Follow Up Recommendations  SNF     Equipment Recommendations  None recommended by PT    Recommendations for Other Services       Precautions / Restrictions Precautions Precautions: Fall Required Braces or Orthoses: Other Brace/Splint Other Brace/Splint: PRAFO BIL with walking soles    Mobility  Bed Mobility Overal bed mobility: Needs Assistance Bed Mobility: Supine to Sit     Supine to sit: Mod assist;HOB elevated     General bed mobility comments: Mod assist for truncal assist to rise EOB and use of bed pad to assist pt lightly to EOB.  Transfers Overall transfer level: Needs assistance   Transfers: Sit to/from Stand Sit to Stand: +2 physical assistance;+2 safety/equipment;Max assist         General transfer comment: Attempted standing from elevated bed (due to knee pain and weakness) and with 2 person assist pt was only able to crouch and unable to get Denna Haggard seat flaps under patient. Returned to sitting on EOB with pt very close to edge. Attempted to lower bed to prevent pt sliding off EOB, however pt could not tolerate the incr knee flexion. Nurse tech in to assist and with 3 person assist, scooted pt's hips back onto bed.  Ambulation/Gait             General Gait Details: pt reports he has not been able to ambulate for 1 year (only  pivots)   [employer] (Stroke Patients Only)       Balance Overall balance assessment: Needs assistance Sitting-balance support: Bilateral upper extremity supported;Feet supported Sitting balance-[name]: Poor Sitting balance - Comments: Pt shifting laterally side to side with need for UE support and appears unsteady                            Cognition Arousal/Alertness: Awake/alert Behavior During Therapy: WFL for tasks assessed/performed Overall Cognitive Status: Within Functional Limits for tasks assessed                      Exercises General Exercises - Lower Extremity Long Arc Quad: AROM;Both;5 reps;Seated (2 sets; incomplete ROM due to weakness) Heel Slides: AROM;Both;10 reps;Supine (with PRAFOs to protect heels)    General Comments General comments (skin integrity, edema, etc.): Wife present      Pertinent Vitals/Pain Pain Assessment: Faces Pain Location: Rt knee even with gentle palpation Pain Descriptors / Indicators: Grimacing Pain Intervention(s): Limited activity within patient's tolerance;Monitored during session    Home Living                      Prior Function            PT Goals (current goals can now be  found in the care plan section) Acute Rehab PT Goals Patient Stated Goal: Get stronger Time For Goal Achievement: 11/03/15 Progress towards PT goals: Not progressing toward goals - comment    Frequency  Min 2X/week    PT Plan Current plan remains appropriate    Co-evaluation             End of Session Equipment Utilized During Treatment: Gait belt;Oxygen Activity Tolerance: Patient limited by fatigue;Patient limited by pain Patient left: with call bell/phone within reach;in bed;with family/visitor present     Time: [id number] PT Time Calculation (min) (ACUTE ONLY): 35 min  Charges:  $Therapeutic Exercise: 8-22 mins $Therapeutic Activity: 8-22  mins                    G Codes:      [name] 2015/11/23, 1:41 PM  Pager [phone]

## 2015-10-27 DIAGNOSIS — Z7901 Long term (current) use of anticoagulants: Secondary | ICD-10-CM

## 2015-10-27 LAB — BASIC METABOLIC PANEL
Anion gap: 11 (ref 5–15)
BUN: 31 mg/dL — AB (ref 6–20)
CHLORIDE: 88 mmol/L — AB (ref 101–111)
CO2: 36 mmol/L — ABNORMAL HIGH (ref 22–32)
Calcium: 8.8 mg/dL — ABNORMAL LOW (ref 8.9–10.3)
Creatinine, Ser: 0.98 mg/dL (ref 0.61–1.24)
GFR calc Af Amer: >60 mL/min (ref >60)
GFR calc non Af Amer: >60 mL/min (ref >60)
GLUCOSE: 287 mg/dL — AB (ref 65–99)
POTASSIUM: 3.2 mmol/L — AB (ref 3.5–5.1)
SODIUM: 135 mmol/L (ref 135–145)

## 2015-10-27 LAB — GLUCOSE, CAPILLARY
GLUCOSE-CAPILLARY: 216 mg/dL — AB (ref 65–99)
GLUCOSE-CAPILLARY: 172 mg/dL — AB (ref 65–99)
GLUCOSE-CAPILLARY: 122 mg/dL — AB (ref 65–99)
Glucose-Capillary: 199 mg/dL — ABNORMAL HIGH (ref 65–99)

## 2015-10-27 LAB — MAGNESIUM: MAGNESIUM: 2.3 mg/dL (ref 1.7–2.4)

## 2015-10-27 MED ORDER — INSULIN NPH (HUMAN) (ISOPHANE) 100 UNIT/ML ~~LOC~~ SUSP
24.0000 [IU] | Freq: Two times a day (BID) | SUBCUTANEOUS | Status: DC
  Administered 2015-10-28 – 2015-10-29 (×3): 24 [IU] via SUBCUTANEOUS
  Filled 2015-10-27: qty 10

## 2015-10-27 MED ORDER — PREDNISONE 10 MG PO TABS
30.0000 mg | ORAL_TABLET | Freq: Every day | ORAL | Status: DC
  Administered 2015-10-28: 30 mg via ORAL
  Filled 2015-10-27: qty 1

## 2015-10-27 MED ORDER — IPRATROPIUM-ALBUTEROL 0.5-2.5 (3) MG/3ML IN SOLN: 3.0000 mL | RESPIRATORY_TRACT | Status: DC | PRN

## 2015-10-27 MED ORDER — POTASSIUM CHLORIDE CRYS ER 20 MEQ PO TBCR
40.0000 meq | EXTENDED_RELEASE_TABLET | Freq: Once | ORAL | Status: AC
  Administered 2015-10-27: 40 meq via ORAL
  Filled 2015-10-27: qty 2

## 2015-10-27 NOTE — Progress Notes (Signed)
Resting comfortably in bed. Dressing change to bed done . Ttolerated procedure well

## 2015-10-27 NOTE — Progress Notes (Signed)
    Subjective:  Denies CP; dyspnea improving  Objective:  Vitals:   10/26/15 2054 10/27/15 0010 10/27/15 0520 10/27/15 0750  BP: (!) 102/52 117/64 (!) 113/52   Pulse: 82 88 94   Resp: 18 19 18    Temp: 98.2 F (36.8 C) 98.3 F (36.8 C) 98.6 F (37 C)   TempSrc: Oral Oral Oral   SpO2: 96% 95% 96% 97%  Weight:   283 lb 8.2 oz (128.6 kg)   Height:        Intake/Output from previous day:  Intake/Output Summary (Last 24 hours) at 10/27/15 0858 Last data filed at 10/27/15 [id number]  Gross per 24 hour  Intake              480 ml  Output              800 ml  Net             -320 ml    Physical Exam: Physical exam: Well-developed obese in no acute distress; chronically ill appearing.  Skin is warm and dry.  HEENT is normal.  Neck is supple.  Chest CTA anteriorly Cardiovascular exam is regular rate and rhythm.  Abdominal exam nontender or distended. No masses palpated. + abdominal wall edema Extremities show trace edema. Lower exts wrapped neuro grossly intact    Lab Results: Basic Metabolic Panel:  Recent Labs  10/26/15 0305 10/27/15 0338  NA 139 135  K 3.8 3.2*  CL 91* 88*  CO2 37* 36*  GLUCOSE 121* 287*  BUN 34* 31*  CREATININE 0.98 0.98  CALCIUM 9.0 8.8*  MG  --  2.3   CBC:  Recent Labs  10/25/15 0418  WBC 14.5*  HGB 9.6*  HCT 34.6*  MCV 70.9*  PLT 343     Assessment/Plan:  1 acute on chronic combined systolic/diastolic congestive heart failure-volume status improving. I/O -320.  Weight ? 283. Continue lasix 80 BID. Follow renal function. Hopefully can transition to oral lasix in 24-48 hrs. 2 ischemic cardiomyopathy-continue carvedilol 3.125 mg twice a day. Advance as tolerated by blood pressure. Continue ACE inhibitor. Continue spironolactone 25 mg daily. Patient would not be a candidate for ICD. 3 peripheral vascular disease-continue statin. Management per vascular surgery. 4 coronary artery disease-continue statin. Not on aspirin as he is  chronically anticoagulated. 5 hyperlipidemia-continue statin. 6 COPD/URI-Antibiotics and pulmonary toilet per primary service. 7 Hypokalemia-supplement  Kirk Ruths 10/27/2015, 8:58 AM

## 2015-10-27 NOTE — Progress Notes (Signed)
PROGRESS NOTE  [name] [id number] DOB: 09-18-1940 DOA: 10/19/2015 PCP: [name], MD Brief History:  75 y.o.malewith medical history significant of sCHF with EF 40-45%, hypertension, PVD,diabetes mellitus, hyperlipidemia, COPD, GERD, depression, anxiety, CAD, s/p of CABG, PE on Xarelto, stroke, BPH, who presents with shortness of breath.   Patient reports that he has been having shortness of breath for nearly a month, which has been progressively getting worse. He also has cough with greenish colored sputum production. He also c/o mild intermittent chest pain. It is pleuritic and is aggravated by coughing. No fever or chills. Patient denies nausea, vomiting, abdominal pain, diarrhea. He states that he has mild dysuria sometimes, but no burning or increased urinary frequency. Patient does not have unilateral weakness, rashes. CXR showed pulmonary edema and pt was clinically fluid overloaded.  He was started on IV lasix which was increased to 80 mg bid IVwith good clinical effect Assessment/Plan:  Acute on chronic respiratory failure with hypoxia -Most likely due to combination of COPD and CHF exacerbation. -Treatment and plan below -Will ask RN to monitor O2 sats as patient states he does not use oxygen at SNF. -wean oxygen for sat >92%  Acute on chronic systolic Heart failure -Echocardiogram 07/19/14 showed EF 40-45% -BNP 446, +JVD, LE edema upon admission -10/19/15 CXR mild pulm vascular congestion -NEG 5 L for the admission -NEG 11 lbs since admission -Echocardiogram EF [id number], garde 2 diastolic dysfunction  -Continue lisinopril, coreg, aldactone -remains clinically fluid overloaed -Cardiology consult appreciated, increased lasix to 80mg  BID, added spironolactone, and switched to coreg -d/c naprosyn  COPDexacerbation -Cough has been ongoing for 4 weeks per patient.  -Upon admission, patient had bilateral wheezing on auscultation -Nebulizers: scheduled  Duoneb and prn albuterol nebs -finished 5 days azithromycin -wean solu-medrol-->prednisone 30 mg  -Continue mucinex -Respiratory viral panel unremarkable   Bacteremia--contaminnt -Blood cultures from 10/20/2015 show 1/2 CoNS(likely contamination) -Repeat Blood cultures from 7/30 show no growth to date  Microcytic anemia -Basline hemoglobin 12-13 (waas 12.3 on 07/26/15) -Currently Hemoglobin 8.8 (was 8.7 upon admission) -INR 1.39 -FOBT negative -Anemia panel: iron sat 3 %, Iron 12, ferritin 38, B12 301 -Was given dose of ferrahem and B12 -start ferrous sulfate -Continue to monitor CBC closely  History of PE -CTA chest neg for PE  -Monitor respiratory status closely -Continuexarelto  Hyperlipidemia -Last LDL was 58 on 10/19/10 -Continue statin  Depression and anxiety -Continue Cymbalta and Ativan  GERD -Continue Protonix  Essential hypertension -ChangeZebeta-->coreg  -lisinopril (withholding parameters)  BPH -Continue Flomax  Diabetes Mellitus, type II -Last A1c 7.4 -Increase NPH 24 units bid, ISS, CBG monitoring  PAD and chronic leg wound -Patient has been followed with [facility]. He was seen on 10/18/16 by NP, Barnabas Lister. Per clinic note, wound care center is now managing and evaluating the wounds in his lower legs; he has unna boots on both lower legs. Pt refused to allow the ultrasonographer to removed the unna boot compression dressings in his lower legs in order to perform ABI's. Per Dr. Bridgett Larsson, Pt is not a candidate for any further surgical interventions, need to focus on wound care in this patient primarily. -Wound care consulted  Upper extremity edema -Per patient, he did have an ultrasound of his arms done prior to admission.  -Only carotid doppler done: 1-39% stenosis of RICA, patent left carotid endarterectomy, no Left ICA stenosis -Upper ext doppler negative for DVT -Keep arms elevated.  Disposition  Plan: Home in 2-3 days  Family Communication: Wife updatedat bedside 8/4   Consultants: Cardiology  Code Status: DNR  DVT Prophylaxis: Xarelto    Subjective: Patient complains of intermittent bilateral lower extremity pain. Denies any fevers, chest, chest pain, shortness breath, nausea, vomiting, diarrhea, dysuria, hematuria, occasional, melena.  Objective: Vitals:   10/27/15 0010 10/27/15 0520 10/27/15 0750 10/27/15 1228  BP: 117/64 (!) 113/52  (!) 102/56  Pulse: 88 94  83  Resp: 19 18  20   Temp: 98.3 F (36.8 C) 98.6 F (37 C)  98.6 F (37 C)  TempSrc: Oral Oral  Oral  SpO2: 95% 96% 97% 95%  Weight:  128.6 kg (283 lb 8.2 oz)    Height:        Intake/Output Summary (Last 24 hours) at 10/27/15 1817 Last data filed at 10/27/15 1355  Gross per 24 hour  Intake              480 ml  Output              350 ml  Net              130 ml   Weight change: 5.675 kg (12 lb 8.2 oz) Exam:   General:  Pt is alert, follows commands appropriately, not in acute distress  HEENT: No icterus, No thrush, No neck mass, [place]/AT  Cardiovascular: RRR, S1/S2, no rubs, no gallops  Respiratory: Bibasilar crackles. No wheezing. Good air movement.  Abdomen: Soft/+BS, non tender, non distended, no guarding  Extremities: 1 + LE edema, No lymphangitis, No petechiae, No rashes, no synovitis   Data Reviewed: I have personally reviewed following labs and imaging studies Basic Metabolic Panel:  Recent Labs Lab 10/23/15 0442 10/24/15 0533 10/25/15 0418 10/26/15 0305 10/27/15 0338  NA 133* 136 138 139 135  K 4.7 4.3 3.9 3.8 3.2*  CL 96* 97* 96* 91* 88*  CO2 26 30 32 37* 36*  GLUCOSE 261* 271* 148* 121* 287*  BUN 34* 35* 34* 34* 31*  CREATININE 1.06 1.12 0.87 0.98 0.98  CALCIUM 8.5* 8.5* 8.7* 9.0 8.8*  MG  --   --   --   --  2.3   Liver Function Tests: No results for input(s): AST, ALT, ALKPHOS, BILITOT, PROT, ALBUMIN in the last 168 hours. No results for input(s):  LIPASE, AMYLASE in the last 168 hours. No results for input(s): AMMONIA in the last 168 hours. Coagulation Profile: No results for input(s): INR, PROTIME in the last 168 hours. CBC:  Recent Labs Lab 10/21/15 1006 10/22/15 0810 10/23/15 0442 10/24/15 0533 10/25/15 0418  WBC 15.3* 13.2* 12.0* 12.5* 14.5*  HGB 9.0* 8.9* 8.8* 8.8* 9.6*  HCT 32.4* 32.0* 31.5* 31.7* 34.6*  MCV 70.7* 71.0* 70.0* 70.3* 70.9*  PLT 298 282 285 317 343   Cardiac Enzymes: No results for input(s): CKTOTAL, CKMB, CKMBINDEX, TROPONINI in the last 168 hours. BNP: Invalid input(s): POCBNP CBG:  Recent Labs Lab 10/26/15 1618 10/26/15 2144 10/27/15 0829 10/27/15 1139 10/27/15 1633  GLUCAP 179* 113* 199* 216* 172*   HbA1C: No results for input(s): HGBA1C in the last 72 hours. Urine analysis:    Component Value Date/Time   COLORURINE YELLOW 10/20/2015 1655   APPEARANCEUR CLEAR 10/20/2015 1655   LABSPEC 1.018 10/20/2015 1655   PHURINE 6.0 10/20/2015 1655   GLUCOSEU NEGATIVE 10/20/2015 1655   HGBUR NEGATIVE 10/20/2015 1655   HGBUR negative 03/06/2010 1419   BILIRUBINUR NEGATIVE 10/20/2015 1655   KETONESUR NEGATIVE  10/20/2015 [place] 10/20/2015 1655   UROBILINOGEN 0.2 09/13/2014 0945   NITRITE NEGATIVE 10/20/2015 1655   LEUKOCYTESUR NEGATIVE 10/20/2015 1655   Sepsis Labs: @LABRCNTIP (procalcitonin:4,lacticidven:4) ) Recent Results (from the past 240 hour(s))  Respiratory Panel by PCR     Status: None   Collection Time: 10/20/15  2:12 AM  Result Value Ref Range Status   Adenovirus NOT DETECTED NOT DETECTED Final   Coronavirus 229E NOT DETECTED NOT DETECTED Final   Coronavirus HKU1 NOT DETECTED NOT DETECTED Final   Coronavirus NL63 NOT DETECTED NOT DETECTED Final   Coronavirus OC43 NOT DETECTED NOT DETECTED Final   Metapneumovirus NOT DETECTED NOT DETECTED Final   Rhinovirus / Enterovirus NOT DETECTED NOT DETECTED Final   Influenza A NOT DETECTED NOT DETECTED Final    Influenza A H1 NOT DETECTED NOT DETECTED Final   Influenza A H1 2009 NOT DETECTED NOT DETECTED Final   Influenza A H3 NOT DETECTED NOT DETECTED Final   Influenza B NOT DETECTED NOT DETECTED Final   Parainfluenza Virus 1 NOT DETECTED NOT DETECTED Final   Parainfluenza Virus 2 NOT DETECTED NOT DETECTED Final   Parainfluenza Virus 3 NOT DETECTED NOT DETECTED Final   Parainfluenza Virus 4 NOT DETECTED NOT DETECTED Final   Respiratory Syncytial Virus NOT DETECTED NOT DETECTED Final   Bordetella pertussis NOT DETECTED NOT DETECTED Final   Chlamydophila pneumoniae NOT DETECTED NOT DETECTED Final   Mycoplasma pneumoniae NOT DETECTED NOT DETECTED Final  Culture, blood (Routine X 2) w Reflex to ID Panel     Status: Abnormal   Collection Time: 10/20/15  6:15 AM  Result Value Ref Range Status   Specimen Description BLOOD RIGHT HAND  Final   Special Requests IN PEDIATRIC BOTTLE 2ML  Final   Culture  Setup Time   Final    GRAM POSITIVE COCCI IN CLUSTERS IN PEDIATRIC BOTTLE CRITICAL RESULT CALLED TO, READ BACK BY AND VERIFIED WITH: TO KAMEND(PHARD) BY TCLEVELAND 10/21/2015 AT 5:44AM    Culture (A)  Final    STAPHYLOCOCCUS SPECIES (COAGULASE NEGATIVE) THE SIGNIFICANCE OF ISOLATING THIS ORGANISM FROM A SINGLE SET OF BLOOD CULTURES WHEN MULTIPLE SETS ARE DRAWN IS UNCERTAIN. PLEASE NOTIFY THE MICROBIOLOGY DEPARTMENT WITHIN ONE WEEK IF SPECIATION AND SENSITIVITIES ARE REQUIRED.    Report Status 10/23/2015 FINAL  Final  Blood Culture ID Panel (Reflexed)     Status: Abnormal   Collection Time: 10/20/15  6:15 AM  Result Value Ref Range Status   Enterococcus species NOT DETECTED NOT DETECTED Final   Vancomycin resistance NOT DETECTED NOT DETECTED Final   Listeria monocytogenes NOT DETECTED NOT DETECTED Final   Staphylococcus species DETECTED (A) NOT DETECTED Final    Comment: CRITICAL RESULT CALLED TO, READ BACK BY AND VERIFIED WITH: TO KAMENED(PHARD) BY TCLEVELAND 10/21/2015 AT 5:44AM    Staphylococcus  aureus NOT DETECTED NOT DETECTED Final   Methicillin resistance NOT DETECTED NOT DETECTED Final   Streptococcus species NOT DETECTED NOT DETECTED Final   Streptococcus agalactiae NOT DETECTED NOT DETECTED Final   Streptococcus pneumoniae NOT DETECTED NOT DETECTED Final   Streptococcus pyogenes NOT DETECTED NOT DETECTED Final   Acinetobacter baumannii NOT DETECTED NOT DETECTED Final   Enterobacteriaceae species NOT DETECTED NOT DETECTED Final   Enterobacter cloacae complex NOT DETECTED NOT DETECTED Final   Escherichia coli NOT DETECTED NOT DETECTED Final   Klebsiella oxytoca NOT DETECTED NOT DETECTED Final   Klebsiella pneumoniae NOT DETECTED NOT DETECTED Final   Proteus species NOT DETECTED NOT DETECTED Final  Serratia marcescens NOT DETECTED NOT DETECTED Final   Carbapenem resistance NOT DETECTED NOT DETECTED Final   Haemophilus influenzae NOT DETECTED NOT DETECTED Final   Neisseria meningitidis NOT DETECTED NOT DETECTED Final   Pseudomonas aeruginosa NOT DETECTED NOT DETECTED Final   Candida albicans NOT DETECTED NOT DETECTED Final   Candida glabrata NOT DETECTED NOT DETECTED Final   Candida krusei NOT DETECTED NOT DETECTED Final   Candida parapsilosis NOT DETECTED NOT DETECTED Final   Candida tropicalis NOT DETECTED NOT DETECTED Final  Culture, blood (Routine X 2) w Reflex to ID Panel     Status: None   Collection Time: 10/20/15  6:18 AM  Result Value Ref Range Status   Specimen Description BLOOD RIGHT ARM  Final   Special Requests IN PEDIATRIC BOTTLE 1ML  Final   Culture NO GROWTH 5 DAYS  Final   Report Status 10/25/2015 FINAL  Final  MRSA PCR Screening     Status: None   Collection Time: 10/20/15  8:44 AM  Result Value Ref Range Status   MRSA by PCR NEGATIVE NEGATIVE Final    Comment:        The GeneXpert MRSA Assay (FDA approved for NASAL specimens only), is one component of a comprehensive MRSA colonization surveillance program. It is not intended to diagnose  MRSA infection nor to guide or monitor treatment for MRSA infections.   Culture, blood (routine x 2)     Status: None   Collection Time: 10/21/15 10:17 AM  Result Value Ref Range Status   Specimen Description BLOOD RIGHT HAND  Final   Special Requests IN PEDIATRIC BOTTLE .5CC  Final   Culture NO GROWTH 5 DAYS  Final   Report Status 10/26/2015 FINAL  Final  Culture, blood (routine x 2)     Status: None   Collection Time: 10/21/15 10:24 AM  Result Value Ref Range Status   Specimen Description BLOOD RIGHT HAND  Final   Special Requests IN PEDIATRIC BOTTLE  .5CC  Final   Culture NO GROWTH 5 DAYS  Final   Report Status 10/26/2015 FINAL  Final     Scheduled Meds: . atorvastatin  40 mg Oral q1800  . carvedilol  3.125 mg Oral BID WC  . dextromethorphan-guaiFENesin  1 tablet Oral BID  . docusate sodium  100 mg Oral BID  . DULoxetine  90 mg Oral Daily  . feeding supplement (GLUCERNA SHAKE)  237 mL Oral Q24H  . feeding supplement (PRO-STAT SUGAR FREE 64)  30 mL Oral TID WC  . ferrous sulfate  325 mg Oral Q breakfast  . furosemide  80 mg Intravenous BID  . gabapentin  400 mg Oral TID  . insulin aspart  0-9 Units Subcutaneous TID WC  . insulin aspart  4 Units Subcutaneous TID WC  . insulin NPH Human  20 Units Subcutaneous BID AC & HS  . lidocaine  1 patch Transdermal Q24H  . lisinopril  2.5 mg Oral Daily  . LORazepam  0.25 mg Oral QHS  . lubiprostone  8 mcg Oral BID WC  . multivitamin  1 tablet Oral Daily  . naloxegol oxalate  25 mg Oral Daily  . pantoprazole  40 mg Oral Daily  . polyvinyl alcohol  1 drop Both Eyes BID  . predniSONE  40 mg Oral Q breakfast  . rivaroxaban  20 mg Oral Q supper  . sodium chloride flush  3 mL Intravenous Q12H  . spironolactone  25 mg Oral Daily  . tamsulosin  0.4  mg Oral QPC supper  . traZODone  100 mg Oral QHS  . Vitamin D (Ergocalciferol)  50,000 Units Oral Q Wed   Continuous Infusions:   Procedures/Studies: Dg Chest 2 View  Result Date:  10/19/2015 CLINICAL DATA:  Productive cough. EXAM: CHEST  2 VIEW COMPARISON:  08/24/2014 FINDINGS: Postsurgical changes from CABG is stable. Cardiomediastinal silhouette is mildly enlarged. Mediastinal contours appear intact. There is no evidence of focal airspace consolidation, pleural effusion or pneumothorax. Mild pulmonary vascular congestion. Osseous structures are without acute abnormality. Soft tissues are grossly normal. IMPRESSION: Mildly enlarged cardiac silhouette. Mild pulmonary vascular congestion. Electronically Signed   By: Fidela Salisbury M.D.   On: 10/19/2015 17:29  Ct Angio Chest Pe W And/or Wo Contrast  Result Date: 10/19/2015 CLINICAL DATA:  Shortness of Breath with productive cough EXAM: CT ANGIOGRAPHY CHEST WITH CONTRAST TECHNIQUE: Multidetector CT imaging of the chest was performed using the standard protocol during bolus administration of intravenous contrast. Multiplanar CT image reconstructions and MIPs were obtained to evaluate the vascular anatomy. CONTRAST:  80 mL Isovue 370. COMPARISON:  None. FINDINGS: Mediastinum/Lymph Nodes: The thoracic inlet is within normal limits. No significant hilar or mediastinal adenopathy is noted. Cardiovascular: Pulmonary artery demonstrates a normal branching pattern. No findings to suggest pulmonary emboli are seen. Aortic calcifications are noted without aneurysmal dilatation or dissection. Changes consistent with coronary bypass grafting are noted. Multiple coronary stents are noted within the bypass grafts. Native coronary calcifications are seen. No right heart strain is noted. No pericardial effusion is seen. Lungs/Pleura: Bilateral pleural effusions right greater than left are seen. Bilateral dependent atelectatic changes are noted. No significant pulmonary edema is noted. Upper abdomen: Visualized upper abdomen is within normal limits. Musculoskeletal: Degenerative changes of the thoracic spine are noted. Review of the MIP images confirms  the above findings. IMPRESSION: No evidence of pulmonary emboli. Findings of prior coronary bypass grafting with stenting. No acute abnormality noted. Electronically Signed   By: Inez Catalina M.D.   On: 10/19/2015 21:41   Caliann Leckrone, DO  [facility] Pager [phone]  If 7PM-7AM, please contact night-coverage [email or web address] Password [email or web address] 10/27/2015, 6:17 PM   LOS: 8 days

## 2015-10-28 LAB — BASIC METABOLIC PANEL
Anion gap: 14 (ref 5–15)
Anion gap: 8 (ref 5–15)
BUN: 33 mg/dL — AB (ref 6–20)
BUN: 29 mg/dL — AB (ref 6–20)
CALCIUM: 8.4 mg/dL — AB (ref 8.9–10.3)
CALCIUM: 8.6 mg/dL — AB (ref 8.9–10.3)
CO2: 29 mmol/L (ref 22–32)
CO2: 35 mmol/L — ABNORMAL HIGH (ref 22–32)
CREATININE: 0.9 mg/dL (ref 0.61–1.24)
CREATININE: 0.88 mg/dL (ref 0.61–1.24)
Chloride: 91 mmol/L — ABNORMAL LOW (ref 101–111)
Chloride: 90 mmol/L — ABNORMAL LOW (ref 101–111)
GFR calc Af Amer: >60 mL/min (ref >60)
GFR calc non Af Amer: >60 mL/min (ref >60)
Glucose, Bld: 158 mg/dL — ABNORMAL HIGH (ref 65–99)
Glucose, Bld: 269 mg/dL — ABNORMAL HIGH (ref 65–99)
Potassium: 5.8 mmol/L — ABNORMAL HIGH (ref 3.5–5.1)
Potassium: 4.3 mmol/L (ref 3.5–5.1)
SODIUM: 133 mmol/L — AB (ref 135–145)
Sodium: 134 mmol/L — ABNORMAL LOW (ref 135–145)

## 2015-10-28 LAB — GLUCOSE, CAPILLARY
GLUCOSE-CAPILLARY: 175 mg/dL — AB (ref 65–99)
GLUCOSE-CAPILLARY: 271 mg/dL — AB (ref 65–99)
Glucose-Capillary: 173 mg/dL — ABNORMAL HIGH (ref 65–99)
Glucose-Capillary: 153 mg/dL — ABNORMAL HIGH (ref 65–99)

## 2015-10-28 MED ORDER — PREDNISONE 20 MG PO TABS
20.0000 mg | ORAL_TABLET | Freq: Every day | ORAL | Status: DC
  Administered 2015-10-29: 20 mg via ORAL
  Filled 2015-10-28: qty 1

## 2015-10-28 MED ORDER — FUROSEMIDE 80 MG PO TABS
80.0000 mg | ORAL_TABLET | Freq: Two times a day (BID) | ORAL | Status: DC
  Administered 2015-10-28 – 2015-10-29 (×2): 80 mg via ORAL
  Filled 2015-10-28 (×2): qty 1

## 2015-10-28 NOTE — Progress Notes (Signed)
    Subjective:  Denies CP; denies dyspnea  Objective:  Vitals:   10/27/15 1228 10/27/15 2049 10/28/15 0458 10/28/15 0645  BP: (!) 102/56 (!) 95/51 (!) 100/54   Pulse: 83 (!) 101 95   Resp: 20 18 20    Temp: 98.6 F (37 C) 98.1 F (36.7 C) 97.8 F (36.6 C)   TempSrc: Oral Oral Oral   SpO2: 95% 94% 98%   Weight:    257 lb (116.6 kg)  Height:        Intake/Output from previous day:  Intake/Output Summary (Last 24 hours) at 10/28/15 1018 Last data filed at 10/28/15 0911  Gross per 24 hour  Intake              940 ml  Output              275 ml  Net              665 ml    Physical Exam: Physical exam: Well-developed obese in no acute distress; chronically ill appearing.  Skin is warm and dry.  HEENT is normal.  Neck is supple.  Chest CTA anteriorly Cardiovascular exam is regular rate and rhythm.  Abdominal exam nontender or distended. No masses palpated. + abdominal wall edema (improving) Extremities show trace edema. Lower exts wrapped neuro grossly intact    Lab Results: Basic Metabolic Panel:  Recent Labs  10/27/15 0338 10/28/15 0407  NA 135 134*  K 3.2* 5.8*  CL 88* 91*  CO2 36* 29  GLUCOSE 287* 158*  BUN 31* 33*  CREATININE 0.98 0.90  CALCIUM 8.8* 8.4*  MG 2.3  --      Assessment/Plan:  1 acute on chronic combined systolic/diastolic congestive heart failure-volume status improving. I/O -+425.  Weight 257. Change lasix to 80 by mouth BID. Follow renal function.  2 ischemic cardiomyopathy-continue carvedilol 3.125 mg twice a day. Advance as tolerated by blood pressure. Continue ACE inhibitor. Continue spironolactone 25 mg daily. Patient would not be a candidate for ICD. 3 peripheral vascular disease-continue statin. Management per vascular surgery. 4 coronary artery disease-continue statin. Not on aspirin as he is chronically anticoagulated. 5 hyperlipidemia-continue statin. 6 COPD/URI-Antibiotics and pulmonary toilet per primary service. 7  Hyperkalemia-K 5.8 this AM; ? Hemolyzed; recheck; if remains elevated would need to hold ACEI and spironolactone.  Kirk Ruths 10/28/2015, 10:18 AM

## 2015-10-28 NOTE — Progress Notes (Signed)
PROGRESS NOTE  Todd SCHOEFF [id number] DOB: 1940/11/30 DOA: 10/19/2015 PCP: [name], MD Brief History: 75 y.o.malewith medical history significant of sCHF with EF 40-45%, hypertension, PVD,diabetes mellitus, hyperlipidemia, COPD, GERD, depression, anxiety, CAD, s/p of CABG, PE on Xarelto, stroke, BPH, who presents with shortness of breath.   Patient reports that he has been having shortness of breath for nearly a month, which has been progressively getting worse. He also has cough with greenish colored sputum production. He also c/omild intermittent chest pain. It is pleuritic and is aggravated by coughing. No fever or chills. Patient denies nausea, vomiting, abdominal pain, diarrhea. He states that he has mild dysuria sometimes, but no burning or increased urinary frequency. Patient does not have unilateral weakness, rashes. CXR showed pulmonary edema and pt was clinically fluid overloaded. He was started on IV lasix which was increased to 80 mg bid IVwith good clinical effect.  Assessment/Plan:  Acute on chronic respiratory failure with hypoxia -Most likely due to combination of COPD and CHF exacerbation. -Treatment and plan below -Will ask RN to monitor O2 sats as patient states he does not use oxygen at SNF. -wean oxygen for sat >92%  Acute on chronic systolic Heart failure -Echocardiogram 07/19/14 showed EF 40-45% -BNP 446, +JVD, LE edema upon admission -10/19/15 CXR mild pulm vascular congestion -NEG 5L for the admission -question weight accuracy--NEG 26 lbs last 24 hours -Echocardiogram EF [id number], garde 2 diastolic dysfunction  -Continue lisinopril, coreg, aldactone -Cardiology consult appreciated, increased lasix to IV 80mg  BID on 8/2, added spironolactone, and switched to coreg -d/c naprosyn -lasix to po on 10/28/15  COPDexacerbation -Cough has been ongoing for 4 weeks per patient.  -Upon admission, patient had bilateral wheezing on  auscultation -Nebulizers: scheduled Duoneb and prn albuterol nebs -finished 5 days azithromycin -wean solu-medrol-->prednisone 20 mg  -Continue mucinex -Respiratory viral panel unremarkable   Bacteremia--contaminnt -Blood cultures from 10/20/2015 show 1/2 CoNS(likely contamination) -Repeat Blood cultures from 7/30 show no growth to date  Microcytic anemia -Basline hemoglobin 12-13 (waas 12.3 on 07/26/15) -Currently Hemoglobin 8.8 (was 8.7 upon admission) -INR 1.39 -FOBT negative -Anemia panel: iron sat 3 %, Iron 12, ferritin 38, B12 301 -Was given dose of ferrahem and B12 -start ferrous sulfate -Continue to monitor CBC closely  History of PE -CTA chest neg for PE  -Monitor respiratory status closely -Continuexarelto  Diabetes Mellitus, type II -Last A1c 7.4 -Increase NPH 24 units bid, ISS, CBG monitoring -pt refusing evening dose of NPH last 2 nights  Hyperlipidemia -Last LDL was 92 on 03/10/13 -Continue statin -am lipid panel  Depression and anxiety -Continue Cymbalta and Ativan  GERD -Continue Protonix  Essential hypertension -ChangeZebeta-->coreg  -lisinopril (withholding parameters)  BPH -Continue Flomax    PAD and chronic leg wound -Patient has been followed with [facility]. He was seen on 10/18/16 by NP, Barnabas Lister. Per clinic note, wound care center is now managing and evaluating the wounds in his lower legs; he has unna boots on both lower legs. Pt refused to allow the ultrasonographer to removed the unna boot compression dressings in his lower legs in order to perform ABI's. Per Dr. Bridgett Larsson, Pt is not a candidate for any further surgical interventions, need to focus on wound care in this patient primarily. -Wound care reconsulted for d/c recommendations   Upper extremity edema -Per patient, he did have an ultrasound of his arms done prior to admission.  -Only carotid doppler done:  1-39% stenosis of RICA,  patent left carotid endarterectomy, no Left ICA stenosis -Upper ext doppler negative for DVT -Keep arms elevated.   Disposition Plan: SNF 8/7 or 8/8 if cleared by cardiology Family Communication: Wife updatedat bedside 8/4   Consultants: Cardiology  Code Status: DNR  DVT Prophylaxis: Xarelto    Subjective: Patient denies fevers, chills, headache, chest pain, dyspnea, nausea, vomiting, diarrhea, abdominal pain, dysuria, hematuria, hematochezia, and melena.   Objective: Vitals:   10/28/15 0458 10/28/15 0645 10/28/15 1050 10/28/15 1317  BP: (!) 100/54  102/68 (!) 109/53  Pulse: 95   82  Resp: 20   20  Temp: 97.8 F (36.6 C)   98.2 F (36.8 C)  TempSrc: Oral   Oral  SpO2: 98%   91%  Weight:  116.6 kg (257 lb)    Height:        Intake/Output Summary (Last 24 hours) at 10/28/15 1707 Last data filed at 10/28/15 1638  Gross per 24 hour  Intake              940 ml  Output             1250 ml  Net             -310 ml   Weight change: -12.025 kg (-26 lb 8.2 oz) Exam:   General:  Pt is alert, follows commands appropriately, not in acute distress  HEENT: No icterus, No thrush, No neck mass, [place]/AT  Cardiovascular: RRR, S1/S2, no rubs, no gallops  Respiratory: Bibasilar crackles. No wheeze. Good air movement  Abdomen: Soft/+BS, non tender, non distended, no guarding  Extremities: 1 + LE edema, No lymphangitis, No petechiae, No rashes, no synovitis   Data Reviewed: I have personally reviewed following labs and imaging studies Basic Metabolic Panel:  Recent Labs Lab 10/25/15 0418 10/26/15 0305 10/27/15 0338 10/28/15 0407 10/28/15 1037  NA 138 139 135 134* 133*  K 3.9 3.8 3.2* 5.8* 4.3  CL 96* 91* 88* 91* 90*  CO2 32 37* 36* 29 35*  GLUCOSE 148* 121* 287* 158* 269*  BUN 34* 34* 31* 33* 29*  CREATININE 0.87 0.98 0.98 0.90 0.88  CALCIUM 8.7* 9.0 8.8* 8.4* 8.6*  MG  --   --  2.3  --   --    Liver Function Tests: No results for input(s): AST,  ALT, ALKPHOS, BILITOT, PROT, ALBUMIN in the last 168 hours. No results for input(s): LIPASE, AMYLASE in the last 168 hours. No results for input(s): AMMONIA in the last 168 hours. Coagulation Profile: No results for input(s): INR, PROTIME in the last 168 hours. CBC:  Recent Labs Lab 10/22/15 0810 10/23/15 0442 10/24/15 0533 10/25/15 0418  WBC 13.2* 12.0* 12.5* 14.5*  HGB 8.9* 8.8* 8.8* 9.6*  HCT 32.0* 31.5* 31.7* 34.6*  MCV 71.0* 70.0* 70.3* 70.9*  PLT 282 285 317 343   Cardiac Enzymes: No results for input(s): CKTOTAL, CKMB, CKMBINDEX, TROPONINI in the last 168 hours. BNP: Invalid input(s): POCBNP CBG:  Recent Labs Lab 10/27/15 1633 10/27/15 2048 10/28/15 0550 10/28/15 1135 10/28/15 1636  GLUCAP 172* 122* 175* 271* 173*   HbA1C: No results for input(s): HGBA1C in the last 72 hours. Urine analysis:    Component Value Date/Time   COLORURINE YELLOW 10/20/2015 [place] 10/20/2015 1655   LABSPEC 1.018 10/20/2015 1655   PHURINE 6.0 10/20/2015 1655   GLUCOSEU NEGATIVE 10/20/2015 1655   HGBUR NEGATIVE 10/20/2015 1655   HGBUR negative 03/06/2010 1419  BILIRUBINUR NEGATIVE 10/20/2015 [place] 10/20/2015 1655   PROTEINUR NEGATIVE 10/20/2015 1655   UROBILINOGEN 0.2 09/13/2014 0945   NITRITE NEGATIVE 10/20/2015 1655   LEUKOCYTESUR NEGATIVE 10/20/2015 1655   Sepsis Labs: @LABRCNTIP (procalcitonin:4,lacticidven:4) ) Recent Results (from the past 240 hour(s))  Respiratory Panel by PCR     Status: None   Collection Time: 10/20/15  2:12 AM  Result Value Ref Range Status   Adenovirus NOT DETECTED NOT DETECTED Final   Coronavirus 229E NOT DETECTED NOT DETECTED Final   Coronavirus HKU1 NOT DETECTED NOT DETECTED Final   Coronavirus NL63 NOT DETECTED NOT DETECTED Final   Coronavirus OC43 NOT DETECTED NOT DETECTED Final   Metapneumovirus NOT DETECTED NOT DETECTED Final   Rhinovirus / Enterovirus NOT DETECTED NOT DETECTED Final   Influenza  A NOT DETECTED NOT DETECTED Final   Influenza A H1 NOT DETECTED NOT DETECTED Final   Influenza A H1 2009 NOT DETECTED NOT DETECTED Final   Influenza A H3 NOT DETECTED NOT DETECTED Final   Influenza B NOT DETECTED NOT DETECTED Final   Parainfluenza Virus 1 NOT DETECTED NOT DETECTED Final   Parainfluenza Virus 2 NOT DETECTED NOT DETECTED Final   Parainfluenza Virus 3 NOT DETECTED NOT DETECTED Final   Parainfluenza Virus 4 NOT DETECTED NOT DETECTED Final   Respiratory Syncytial Virus NOT DETECTED NOT DETECTED Final   Bordetella pertussis NOT DETECTED NOT DETECTED Final   Chlamydophila pneumoniae NOT DETECTED NOT DETECTED Final   Mycoplasma pneumoniae NOT DETECTED NOT DETECTED Final  Culture, blood (Routine X 2) w Reflex to ID Panel     Status: Abnormal   Collection Time: 10/20/15  6:15 AM  Result Value Ref Range Status   Specimen Description BLOOD RIGHT HAND  Final   Special Requests IN PEDIATRIC BOTTLE 2ML  Final   Culture  Setup Time   Final    GRAM POSITIVE COCCI IN CLUSTERS IN PEDIATRIC BOTTLE CRITICAL RESULT CALLED TO, READ BACK BY AND VERIFIED WITH: TO KAMEND(PHARD) BY TCLEVELAND 10/21/2015 AT 5:44AM    Culture (A)  Final    STAPHYLOCOCCUS SPECIES (COAGULASE NEGATIVE) THE SIGNIFICANCE OF ISOLATING THIS ORGANISM FROM A SINGLE SET OF BLOOD CULTURES WHEN MULTIPLE SETS ARE DRAWN IS UNCERTAIN. PLEASE NOTIFY THE MICROBIOLOGY DEPARTMENT WITHIN ONE WEEK IF SPECIATION AND SENSITIVITIES ARE REQUIRED.    Report Status 10/23/2015 FINAL  Final  Blood Culture ID Panel (Reflexed)     Status: Abnormal   Collection Time: 10/20/15  6:15 AM  Result Value Ref Range Status   Enterococcus species NOT DETECTED NOT DETECTED Final   Vancomycin resistance NOT DETECTED NOT DETECTED Final   Listeria monocytogenes NOT DETECTED NOT DETECTED Final   Staphylococcus species DETECTED (A) NOT DETECTED Final    Comment: CRITICAL RESULT CALLED TO, READ BACK BY AND VERIFIED WITH: TO KAMENED(PHARD) BY TCLEVELAND  10/21/2015 AT 5:44AM    Staphylococcus aureus NOT DETECTED NOT DETECTED Final   Methicillin resistance NOT DETECTED NOT DETECTED Final   Streptococcus species NOT DETECTED NOT DETECTED Final   Streptococcus agalactiae NOT DETECTED NOT DETECTED Final   Streptococcus pneumoniae NOT DETECTED NOT DETECTED Final   Streptococcus pyogenes NOT DETECTED NOT DETECTED Final   Acinetobacter baumannii NOT DETECTED NOT DETECTED Final   Enterobacteriaceae species NOT DETECTED NOT DETECTED Final   Enterobacter cloacae complex NOT DETECTED NOT DETECTED Final   Escherichia coli NOT DETECTED NOT DETECTED Final   Klebsiella oxytoca NOT DETECTED NOT DETECTED Final   Klebsiella pneumoniae NOT DETECTED NOT DETECTED Final  Proteus species NOT DETECTED NOT DETECTED Final   Serratia marcescens NOT DETECTED NOT DETECTED Final   Carbapenem resistance NOT DETECTED NOT DETECTED Final   Haemophilus influenzae NOT DETECTED NOT DETECTED Final   Neisseria meningitidis NOT DETECTED NOT DETECTED Final   Pseudomonas aeruginosa NOT DETECTED NOT DETECTED Final   Candida albicans NOT DETECTED NOT DETECTED Final   Candida glabrata NOT DETECTED NOT DETECTED Final   Candida krusei NOT DETECTED NOT DETECTED Final   Candida parapsilosis NOT DETECTED NOT DETECTED Final   Candida tropicalis NOT DETECTED NOT DETECTED Final  Culture, blood (Routine X 2) w Reflex to ID Panel     Status: None   Collection Time: 10/20/15  6:18 AM  Result Value Ref Range Status   Specimen Description BLOOD RIGHT ARM  Final   Special Requests IN PEDIATRIC BOTTLE 1ML  Final   Culture NO GROWTH 5 DAYS  Final   Report Status 10/25/2015 FINAL  Final  MRSA PCR Screening     Status: None   Collection Time: 10/20/15  8:44 AM  Result Value Ref Range Status   MRSA by PCR NEGATIVE NEGATIVE Final    Comment:        The GeneXpert MRSA Assay (FDA approved for NASAL specimens only), is one component of a comprehensive MRSA colonization surveillance program.  It is not intended to diagnose MRSA infection nor to guide or monitor treatment for MRSA infections.   Culture, blood (routine x 2)     Status: None   Collection Time: 10/21/15 10:17 AM  Result Value Ref Range Status   Specimen Description BLOOD RIGHT HAND  Final   Special Requests IN PEDIATRIC BOTTLE .5CC  Final   Culture NO GROWTH 5 DAYS  Final   Report Status 10/26/2015 FINAL  Final  Culture, blood (routine x 2)     Status: None   Collection Time: 10/21/15 10:24 AM  Result Value Ref Range Status   Specimen Description BLOOD RIGHT HAND  Final   Special Requests IN PEDIATRIC BOTTLE  .5CC  Final   Culture NO GROWTH 5 DAYS  Final   Report Status 10/26/2015 FINAL  Final     Scheduled Meds: . atorvastatin  40 mg Oral q1800  . carvedilol  3.125 mg Oral BID WC  . dextromethorphan-guaiFENesin  1 tablet Oral BID  . docusate sodium  100 mg Oral BID  . DULoxetine  90 mg Oral Daily  . feeding supplement (GLUCERNA SHAKE)  237 mL Oral Q24H  . feeding supplement (PRO-STAT SUGAR FREE 64)  30 mL Oral TID WC  . ferrous sulfate  325 mg Oral Q breakfast  . furosemide  80 mg Oral BID  . gabapentin  400 mg Oral TID  . insulin aspart  0-9 Units Subcutaneous TID WC  . insulin aspart  4 Units Subcutaneous TID WC  . insulin NPH Human  24 Units Subcutaneous BID AC & HS  . lidocaine  1 patch Transdermal Q24H  . lisinopril  2.5 mg Oral Daily  . LORazepam  0.25 mg Oral QHS  . lubiprostone  8 mcg Oral BID WC  . multivitamin  1 tablet Oral Daily  . naloxegol oxalate  25 mg Oral Daily  . pantoprazole  40 mg Oral Daily  . polyvinyl alcohol  1 drop Both Eyes BID  . predniSONE  30 mg Oral Q breakfast  . rivaroxaban  20 mg Oral Q supper  . sodium chloride flush  3 mL Intravenous Q12H  . spironolactone  25 mg Oral Daily  . tamsulosin  0.4 mg Oral QPC supper  . traZODone  100 mg Oral QHS  . Vitamin D (Ergocalciferol)  50,000 Units Oral Q Wed   Continuous Infusions:   Procedures/Studies: Dg Chest 2  View  Result Date: 10/19/2015 CLINICAL DATA:  Productive cough. EXAM: CHEST  2 VIEW COMPARISON:  08/24/2014 FINDINGS: Postsurgical changes from CABG is stable. Cardiomediastinal silhouette is mildly enlarged. Mediastinal contours appear intact. There is no evidence of focal airspace consolidation, pleural effusion or pneumothorax. Mild pulmonary vascular congestion. Osseous structures are without acute abnormality. Soft tissues are grossly normal. IMPRESSION: Mildly enlarged cardiac silhouette. Mild pulmonary vascular congestion. Electronically Signed   By: Fidela Salisbury M.D.   On: 10/19/2015 17:29  Ct Angio Chest Pe W And/or Wo Contrast  Result Date: 10/19/2015 CLINICAL DATA:  Shortness of Breath with productive cough EXAM: CT ANGIOGRAPHY CHEST WITH CONTRAST TECHNIQUE: Multidetector CT imaging of the chest was performed using the standard protocol during bolus administration of intravenous contrast. Multiplanar CT image reconstructions and MIPs were obtained to evaluate the vascular anatomy. CONTRAST:  80 mL Isovue 370. COMPARISON:  None. FINDINGS: Mediastinum/Lymph Nodes: The thoracic inlet is within normal limits. No significant hilar or mediastinal adenopathy is noted. Cardiovascular: Pulmonary artery demonstrates a normal branching pattern. No findings to suggest pulmonary emboli are seen. Aortic calcifications are noted without aneurysmal dilatation or dissection. Changes consistent with coronary bypass grafting are noted. Multiple coronary stents are noted within the bypass grafts. Native coronary calcifications are seen. No right heart strain is noted. No pericardial effusion is seen. Lungs/Pleura: Bilateral pleural effusions right greater than left are seen. Bilateral dependent atelectatic changes are noted. No significant pulmonary edema is noted. Upper abdomen: Visualized upper abdomen is within normal limits. Musculoskeletal: Degenerative changes of the thoracic spine are noted. Review of the  MIP images confirms the above findings. IMPRESSION: No evidence of pulmonary emboli. Findings of prior coronary bypass grafting with stenting. No acute abnormality noted. Electronically Signed   By: Inez Catalina M.D.   On: 10/19/2015 21:41   Elijah Phommachanh, DO  [facility] Pager [phone]  If 7PM-7AM, please contact night-coverage [email or web address] Password [email or web address] 10/28/2015, 5:07 PM   LOS: 9 days

## 2015-10-29 DIAGNOSIS — E876 Hypokalemia: Secondary | ICD-10-CM

## 2015-10-29 DIAGNOSIS — R0902 Hypoxemia: Secondary | ICD-10-CM

## 2015-10-29 DIAGNOSIS — J9620 Acute and chronic respiratory failure, unspecified whether with hypoxia or hypercapnia: Secondary | ICD-10-CM

## 2015-10-29 LAB — CBC
HCT: 38.3 % — ABNORMAL LOW (ref 39.0–52.0)
HEMOGLOBIN: 11 g/dL — AB (ref 13.0–17.0)
MCH: 20.6 pg — ABNORMAL LOW (ref 26.0–34.0)
MCHC: 28.7 g/dL — AB (ref 30.0–36.0)
MCV: 71.7 fL — ABNORMAL LOW (ref 78.0–100.0)
Platelets: 310 10*3/uL (ref 150–400)
RBC: 5.34 MIL/uL (ref 4.22–5.81)
RDW: 21.7 % — ABNORMAL HIGH (ref 11.5–15.5)
WBC: 17.1 10*3/uL — AB (ref 4.0–10.5)

## 2015-10-29 LAB — LIPID PANEL
CHOLESTEROL: 107 mg/dL (ref 0–200)
HDL: 34 mg/dL — AB (ref >40)
LDL Cholesterol: 48 mg/dL (ref 0–99)
TRIGLYCERIDES: 124 mg/dL (ref <150)
Total CHOL/HDL Ratio: 3.1 RATIO
VLDL: 25 mg/dL (ref 0–40)

## 2015-10-29 LAB — BASIC METABOLIC PANEL
ANION GAP: 7 (ref 5–15)
BUN: 26 mg/dL — ABNORMAL HIGH (ref 6–20)
CALCIUM: 8.5 mg/dL — AB (ref 8.9–10.3)
CO2: 34 mmol/L — ABNORMAL HIGH (ref 22–32)
Chloride: 91 mmol/L — ABNORMAL LOW (ref 101–111)
Creatinine, Ser: 0.88 mg/dL (ref 0.61–1.24)
GLUCOSE: 183 mg/dL — AB (ref 65–99)
Potassium: 3.3 mmol/L — ABNORMAL LOW (ref 3.5–5.1)
SODIUM: 132 mmol/L — AB (ref 135–145)

## 2015-10-29 LAB — GLUCOSE, CAPILLARY
Glucose-Capillary: 161 mg/dL — ABNORMAL HIGH (ref 65–99)
Glucose-Capillary: 268 mg/dL — ABNORMAL HIGH (ref 65–99)

## 2015-10-29 MED ORDER — POTASSIUM CHLORIDE CRYS ER 20 MEQ PO TBCR
40.0000 meq | EXTENDED_RELEASE_TABLET | Freq: Once | ORAL | Status: AC
  Administered 2015-10-29: 40 meq via ORAL
  Filled 2015-10-29: qty 2

## 2015-10-29 MED ORDER — INSULIN NPH (HUMAN) (ISOPHANE) 100 UNIT/ML ~~LOC~~ SUSP: 24.0000 [IU] | Freq: Two times a day (BID) | SUBCUTANEOUS | 0 refills | Status: DC

## 2015-10-29 MED ORDER — GLUCERNA SHAKE PO LIQD: 237.0000 mL | ORAL | 0 refills | Status: DC

## 2015-10-29 MED ORDER — SPIRONOLACTONE 25 MG PO TABS: 25.0000 mg | ORAL_TABLET | Freq: Every day | ORAL | 0 refills | Status: DC

## 2015-10-29 MED ORDER — FERROUS SULFATE 325 (65 FE) MG PO TABS: 325.0000 mg | ORAL_TABLET | Freq: Every day | ORAL | 0 refills | Status: AC

## 2015-10-29 MED ORDER — COLLAGENASE 250 UNIT/GM EX OINT
TOPICAL_OINTMENT | Freq: Every day | CUTANEOUS | Status: DC
  Filled 2015-10-29: qty 30

## 2015-10-29 MED ORDER — LORAZEPAM 0.5 MG PO TABS: 0.2500 mg | ORAL_TABLET | Freq: Every day | ORAL | 0 refills | Status: DC | PRN

## 2015-10-29 MED ORDER — HYDROCODONE-ACETAMINOPHEN 7.5-325 MG PO TABS: 1.0000 | ORAL_TABLET | Freq: Four times a day (QID) | ORAL | 0 refills | Status: DC | PRN

## 2015-10-29 MED ORDER — CARVEDILOL 3.125 MG PO TABS: 3.1250 mg | ORAL_TABLET | Freq: Two times a day (BID) | ORAL | 0 refills | Status: DC

## 2015-10-29 MED ORDER — FUROSEMIDE 80 MG PO TABS: 80.0000 mg | ORAL_TABLET | Freq: Two times a day (BID) | ORAL | 0 refills | Status: DC

## 2015-10-29 NOTE — Care Management Important Message (Signed)
Important Message  Patient Details  Name: [name] MRN: [id number] Date of Birth: May 01, 1940   Medicare Important Message Given:  Yes    Loann Quill 10/29/2015, 8:33 AM

## 2015-10-29 NOTE — Clinical Social Work Note (Signed)
Clinical Social Worker facilitated patient discharge including contacting patient family and facility to confirm patient discharge plans.  Clinical information faxed to facility and family agreeable with plan.  CSW arranged ambulance transport via [facility] to [place].  RN to call report prior to discharge.  Clinical Social Worker will sign off for now as social work intervention is no longer needed. Please consult us again if new need arises.  Jesse Barnell Shieh, LCSW [phone] 

## 2015-10-29 NOTE — Progress Notes (Signed)
Subjective:  Sleepy, denies SOB.  Objective:  Vitals:   10/28/15 1050 10/28/15 1317 10/28/15 2032 10/29/15 0440  BP: 102/68 (!) 109/53 (!) 108/53 (!) 104/53  Pulse:  82 92 89  Resp:  20 18 18   Temp:  98.2 F (36.8 C) 98.1 F (36.7 C) 97.6 F (36.4 C)  TempSrc:  Oral Oral Oral  SpO2:  91% 95% 93%  Weight:    259 lb (117.5 kg)  Height:        Intake/Output from previous day:  Intake/Output Summary (Last 24 hours) at 10/29/15 1019 Last data filed at 10/29/15 0916  Gross per 24 hour  Intake             1080 ml  Output             1825 ml  Net             -745 ml   Scheduled Meds: . atorvastatin  40 mg Oral q1800  . carvedilol  3.125 mg Oral BID WC  . docusate sodium  100 mg Oral BID  . DULoxetine  90 mg Oral Daily  . feeding supplement (GLUCERNA SHAKE)  237 mL Oral Q24H  . feeding supplement (PRO-STAT SUGAR FREE 64)  30 mL Oral TID WC  . ferrous sulfate  325 mg Oral Q breakfast  . furosemide  80 mg Oral BID  . gabapentin  400 mg Oral TID  . insulin aspart  0-9 Units Subcutaneous TID WC  . insulin aspart  4 Units Subcutaneous TID WC  . insulin NPH Human  24 Units Subcutaneous BID AC & HS  . lidocaine  1 patch Transdermal Q24H  . lisinopril  2.5 mg Oral Daily  . LORazepam  0.25 mg Oral QHS  . lubiprostone  8 mcg Oral BID WC  . multivitamin  1 tablet Oral Daily  . naloxegol oxalate  25 mg Oral Daily  . pantoprazole  40 mg Oral Daily  . polyvinyl alcohol  1 drop Both Eyes BID  . predniSONE  20 mg Oral Q breakfast  . rivaroxaban  20 mg Oral Q supper  . sodium chloride flush  3 mL Intravenous Q12H  . spironolactone  25 mg Oral Daily  . tamsulosin  0.4 mg Oral QPC supper  . traZODone  100 mg Oral QHS  . Vitamin D (Ergocalciferol)  50,000 Units Oral Q Wed   Continuous Infusions:  PRN Meds:.sodium chloride, acetaminophen, albuterol, HYDROcodone-acetaminophen, ipratropium-albuterol, meclizine, methocarbamol, nitroGLYCERIN, ondansetron (ZOFRAN) IV, sodium chloride  flush, zolpidem   Physical Exam: Physical exam: Well-developed obese in no acute distress; chronically ill appearing.  Skin is warm and dry.  HEENT is normal.  Neck is supple.  Chest CTA B/L Cardiovascular exam is regular rate and rhythm.  Abdominal exam nontender or distended. No masses palpated. + abdominal wall edema (improving) Extremities show trace edema. Lower exts no edema B/L neuro grossly intact  Lab Results: Basic Metabolic Panel:  Recent Labs  10/27/15 0338  10/28/15 1037 10/29/15 0328  NA 135  < > 133* 132*  K 3.2*  < > 4.3 3.3*  CL 88*  < > 90* 91*  CO2 36*  < > 35* 34*  GLUCOSE 287*  < > 269* 183*  BUN 31*  < > 29* 26*  CREATININE 0.98  < > 0.88 0.88  CALCIUM 8.8*  < > 8.6* 8.5*  MG 2.3  --   --   --   < > = values in  this interval not displayed.   Assessment/Plan:   1 acute on chronic combined systolic/diastolic congestive heart failure-volume status improving. I/O -700 mL.  Weight 257. ? Baseline, continue lasix to 80 by mouth BID. Normal renal function.  2 ischemic cardiomyopathy-continue carvedilol 3.125 mg twice a day. Advance as tolerated by blood pressure. Continue ACE inhibitor. Continue spironolactone 25 mg daily. Patient would not be a candidate for ICD. 3 peripheral vascular disease-continue statin. Management per vascular surgery. 4 coronary artery disease-continue statin. Not on aspirin as he is chronically anticoagulated. 5 hyperlipidemia-continue statin. 6 COPD/URI-Antibiotics and pulmonary toilet per primary service. 7 Hypokalemia this am - 3.3, we will replace.  He could be discharged from cardiac standpoint. Continue the same dose of diuretics.   [name] 10/29/2015, 10:19 AM

## 2015-10-29 NOTE — Discharge Summary (Signed)
Physician Discharge Summary  [name] [id number] DOB: 03/25/1940 DOA: 10/19/2015  PCP: [name], MD  Admit date: 10/19/2015 Discharge date: 10/29/2015  Admitted From: SNF Disposition:  SNF  Recommendations for Outpatient Follow-up:  1. Follow up with PCP in 1-2 weeks 2. Please obtain BMP/CBC in one week 3. Wound CARE-->Dressing procedure/placement/frequency: Float heels to reduce pressure.  Foam dressing to left leg to promote healing. Wound care orders are provided for daily care following saline cleanse using calcium alginate dressing Kellie Simmering [phone]) topped with gauze, followed by wrapping of LEs from metatarsal head to knee (heel inclusive). This will be topped with an ACE bandage wrapped in a similar manner.   Santyl ointment for enzymatic debridement of nonviable tissue to bilat heels daily.      Equipment/Devices: 2 L nasal cannula--wean for saturation >92%  Discharge Condition: stable CODE STATUS: FULL Diet recommendation: Heart Healthy / Carb Modified    Brief/Interim Summary: 75 y.o.malewith medical history significant of sCHF with EF 40-45%, hypertension, PVD,diabetes mellitus, hyperlipidemia, COPD, GERD, depression, anxiety, CAD, s/p of CABG, PE on Xarelto, stroke, BPH, who presents with shortness of breath.   Patient reports that he has been having shortness of breath for nearly a month, which has been progressively getting worse. He also has cough with greenish colored sputum production. He also c/omild intermittent chest pain. It is pleuritic and is aggravated by coughing. No fever or chills. Patient denies nausea, vomiting, abdominal pain, diarrhea. He states that he has mild dysuria sometimes, but no burning or increased urinary frequency. Patient does not have unilateral weakness, rashes. CXR showed pulmonary edema and pt was clinically fluid overloaded. He was started on IV lasix which was increased to 80 mg bid IVwith good clinical effect.   Discharge  Diagnoses:  Acute on chronic respiratory failure with hypoxia -Most likely due to combination of COPD and CHF exacerbation. -Treatment and plan below -Will ask RN to monitor O2 sats as patient states he does not use oxygen at SNF. -wean oxygen for sat >92%  Acute on chronic systolic Heart failure -Echocardiogram 07/19/14 showed EF 40-45% -BNP 446, +JVD, LE edema upon admission -10/19/15 CXR mild pulm vascular congestion -NEG 5.5L for the admission -question weight accuracy--NEG 26 lbs last 24 hours -Echocardiogram EF [id number], garde 2 diastolic dysfunction  -Continue lisinopril, coreg, aldactone -Cardiology consult appreciated, increased lasix to IV 80mg  BID on 8/2, added spironolactone, and switched to coreg -d/c naprosyn -lasix to po on 10/28/15  COPDexacerbation -Cough has been ongoing for 4 weeks per patient.  -Upon admission, patient had bilateral wheezing on auscultation -Nebulizers: scheduled Duoneb and prn albuterol nebs -finished 5 days azithromycin -wean solu-medrol-->prednisone 20 mg--no further prednisone after d/c -Continue mucinex -Respiratory viral panel unremarkable   Bacteremia--contaminnt -Blood cultures from 10/20/2015 show 1/2 CoNS(likely contamination) -Repeat Blood cultures from 7/30 show no growth to date  Microcytic anemia -Basline hemoglobin 12-13 (waas 12.3 on 07/26/15) -Currently Hemoglobin 8.8 (was 8.7 upon admission) -INR 1.39 -FOBT negative -Anemia panel: iron sat 3 %, Iron 12, ferritin 38, B12 301 -Was given dose of ferrahem and B12 -start ferrous sulfate -Continue to monitor CBC closely  History of PE -CTA chest neg for PE  -Monitor respiratory status closely -Continuexarelto  Diabetes Mellitus, type II -Last A1c 7.4 -IncreaseNPH 24units bid, ISS, CBG monitoring -restart lispro 8 units with meals   Hyperlipidemia -Last LDL was 92 on 03/10/13 -Continue statin -10/29/15 LDL 48  Depression and anxiety -Continue Cymbalta and  Ativan  GERD -Continue Protonix  Essential hypertension -  ChangeZebeta-->coreg  -lisinopril BPH -Continue Flomax    PAD and chronic leg wound -Patient has been followed with [facility]. He was seen on 10/18/16 by NP, Barnabas Lister. Per clinic note, wound care center is now managing and evaluating the wounds in his lower legs; he has unna boots on both lower legs. Pt refused to allow the ultrasonographer to removed the unna boot compression dressings in his lower legs in order to perform ABI's. Per Dr. Bridgett Larsson, Pt is not a candidate for any further surgical interventions, need to focus on wound care in this patient primarily. -Wound care reconsulted for d/c recommendations   Upper extremity edema -Per patient, he did have an ultrasound of his arms done prior to admission.  -Only carotid doppler done: 1-39% stenosis of RICA, patent left carotid endarterectomy, no Left ICA stenosis -Upper ext doppler negative for DVT -Keep arms elevated.    Discharge Instructions  Discharge Instructions    Diet - low sodium heart healthy    Complete by:  As directed   Increase activity slowly    Complete by:  As directed       Medication List    STOP taking these medications   bisoprolol 5 MG tablet Commonly known as:  ZEBETA   meclizine 25 MG tablet Commonly known as:  ANTIVERT   methocarbamol 500 MG tablet Commonly known as:  ROBAXIN   naproxen sodium 220 MG tablet Commonly known as:  ANAPROX   permethrin 5 % cream Commonly known as:  ELIMITE   torsemide 20 MG tablet Commonly known as:  DEMADEX     TAKE these medications   carvedilol 3.125 MG tablet Commonly known as:  COREG Take 1 tablet (3.125 mg total) by mouth 2 (two) times daily with a meal.   DECUBI-VITE PO Take 1 tablet by mouth daily.   docusate sodium 100 MG capsule Commonly known as:  COLACE Take 100 mg by mouth 2 (two) times daily.   DULoxetine 30 MG capsule Commonly  known as:  CYMBALTA Take 90 mg by mouth daily.   feeding supplement (GLUCERNA SHAKE) Liqd Take 237 mLs by mouth daily.   ferrous sulfate 325 (65 FE) MG tablet Take 1 tablet (325 mg total) by mouth daily with breakfast.   furosemide 80 MG tablet Commonly known as:  LASIX Take 1 tablet (80 mg total) by mouth 2 (two) times daily.   gabapentin 400 MG capsule Commonly known as:  NEURONTIN Take 400 mg by mouth 3 (three) times daily.   HUMALOG KWIKPEN 100 UNIT/ML KiwkPen Generic drug:  insulin lispro Inject 8 Units into the skin 3 (three) times daily before meals. An additional if CBG is 0-149=0 units, 150-400= 5 units before meals and at bedtime   HYDROcodone-acetaminophen 7.5-325 MG tablet Commonly known as:  NORCO Take 1 tablet by mouth every 6 (six) hours as needed for moderate pain. What changed:  when to take this  reasons to take this   insulin NPH Human 100 UNIT/ML injection Commonly known as:  HUMULIN N,NOVOLIN N Inject 0.24 mLs (24 Units total) into the skin 2 (two) times daily before a meal. What changed:  how much to take   lidocaine 5 % Commonly known as:  LIDODERM Place 1 patch onto the skin daily.   lisinopril 2.5 MG tablet Commonly known as:  PRINIVIL,ZESTRIL Take 2.5 mg by mouth daily.   LORazepam 0.5 MG tablet Commonly known as:  ATIVAN Take 0.5 tablets (0.25 mg total) by mouth  daily as needed for anxiety.   lubiprostone 8 MCG capsule Commonly known as:  AMITIZA Take 8 mcg by mouth 2 (two) times daily with a meal.   nitroGLYCERIN 0.4 MG SL tablet Commonly known as:  NITROSTAT Place 1 tablet (0.4 mg total) under the tongue as needed. For chest pains   omeprazole 40 MG capsule Commonly known as:  PRILOSEC Take 1 capsule (40 mg total) by mouth daily.   PROTEIN PO Take 60 mLs by mouth 2 (two) times daily.   rivaroxaban 20 MG Tabs tablet Commonly known as:  XARELTO Take 20 mg by mouth daily with supper.   simvastatin 80 MG tablet Commonly known  as:  ZOCOR Take 1 tablet (80 mg total) by mouth daily.   spironolactone 25 MG tablet Commonly known as:  ALDACTONE Take 1 tablet (25 mg total) by mouth daily.   SYSTANE BALANCE 0.6 % Soln Generic drug:  Propylene Glycol Place 1 drop into both eyes 2 (two) times daily.   tamsulosin 0.4 MG Caps capsule Commonly known as:  FLOMAX Take 1 capsule (0.4 mg total) by mouth daily after supper.   traZODone 100 MG tablet Commonly known as:  DESYREL Take 100 mg by mouth at bedtime.   Vitamin D (Ergocalciferol) 50000 units Caps capsule Commonly known as:  DRISDOL Take 1 capsule (50,000 Units total) by mouth every 7 (seven) days.       Allergies  Allergen Reactions  . Vancomycin Other (See Comments)    "red man syndrome"  . Diazepam Anxiety    REACTION: makes patient cry    Consultations:  cardiology   Procedures/Studies: Dg Chest 2 View  Result Date: 10/19/2015 CLINICAL DATA:  Productive cough. EXAM: CHEST  2 VIEW COMPARISON:  08/24/2014 FINDINGS: Postsurgical changes from CABG is stable. Cardiomediastinal silhouette is mildly enlarged. Mediastinal contours appear intact. There is no evidence of focal airspace consolidation, pleural effusion or pneumothorax. Mild pulmonary vascular congestion. Osseous structures are without acute abnormality. Soft tissues are grossly normal. IMPRESSION: Mildly enlarged cardiac silhouette. Mild pulmonary vascular congestion. Electronically Signed   By: Fidela Salisbury M.D.   On: 10/19/2015 17:29  Ct Angio Chest Pe W And/or Wo Contrast  Result Date: 10/19/2015 CLINICAL DATA:  Shortness of Breath with productive cough EXAM: CT ANGIOGRAPHY CHEST WITH CONTRAST TECHNIQUE: Multidetector CT imaging of the chest was performed using the standard protocol during bolus administration of intravenous contrast. Multiplanar CT image reconstructions and MIPs were obtained to evaluate the vascular anatomy. CONTRAST:  80 mL Isovue 370. COMPARISON:  None. FINDINGS:  Mediastinum/Lymph Nodes: The thoracic inlet is within normal limits. No significant hilar or mediastinal adenopathy is noted. Cardiovascular: Pulmonary artery demonstrates a normal branching pattern. No findings to suggest pulmonary emboli are seen. Aortic calcifications are noted without aneurysmal dilatation or dissection. Changes consistent with coronary bypass grafting are noted. Multiple coronary stents are noted within the bypass grafts. Native coronary calcifications are seen. No right heart strain is noted. No pericardial effusion is seen. Lungs/Pleura: Bilateral pleural effusions right greater than left are seen. Bilateral dependent atelectatic changes are noted. No significant pulmonary edema is noted. Upper abdomen: Visualized upper abdomen is within normal limits. Musculoskeletal: Degenerative changes of the thoracic spine are noted. Review of the MIP images confirms the above findings. IMPRESSION: No evidence of pulmonary emboli. Findings of prior coronary bypass grafting with stenting. No acute abnormality noted. Electronically Signed   By: Inez Catalina M.D.   On: 10/19/2015 21:41  Discharge Exam: Vitals:   10/29/15 0440 10/29/15 1138  BP: (!) 104/53 (!) 111/57  Pulse: 89 82  Resp: 18 16  Temp: 97.6 F (36.4 C) 97.9 F (36.6 C)   Vitals:   10/28/15 1317 10/28/15 2032 10/29/15 0440 10/29/15 1138  BP: (!) 109/53 (!) 108/53 (!) 104/53 (!) 111/57  Pulse: 82 92 89 82  Resp: 20 18 18 16   Temp: 98.2 F (36.8 C) 98.1 F (36.7 C) 97.6 F (36.4 C) 97.9 F (36.6 C)  TempSrc: Oral Oral Oral Oral  SpO2: 91% 95% 93% 94%  Weight:   117.5 kg (259 lb)   Height:        General: Pt is alert, awake, not in acute distress Cardiovascular: RRR, S1/S2 +, no rubs, no gallops Respiratory: bibasilar rales Abdominal: Soft, NT, ND, bowel sounds + Extremities: trace LE edema, no cyanosis   The results of significant diagnostics from this hospitalization (including imaging,  microbiology, ancillary and laboratory) are listed below for reference.    Significant Diagnostic Studies: Dg Chest 2 View  Result Date: 10/19/2015 CLINICAL DATA:  Productive cough. EXAM: CHEST  2 VIEW COMPARISON:  08/24/2014 FINDINGS: Postsurgical changes from CABG is stable. Cardiomediastinal silhouette is mildly enlarged. Mediastinal contours appear intact. There is no evidence of focal airspace consolidation, pleural effusion or pneumothorax. Mild pulmonary vascular congestion. Osseous structures are without acute abnormality. Soft tissues are grossly normal. IMPRESSION: Mildly enlarged cardiac silhouette. Mild pulmonary vascular congestion. Electronically Signed   By: Fidela Salisbury M.D.   On: 10/19/2015 17:29  Ct Angio Chest Pe W And/or Wo Contrast  Result Date: 10/19/2015 CLINICAL DATA:  Shortness of Breath with productive cough EXAM: CT ANGIOGRAPHY CHEST WITH CONTRAST TECHNIQUE: Multidetector CT imaging of the chest was performed using the standard protocol during bolus administration of intravenous contrast. Multiplanar CT image reconstructions and MIPs were obtained to evaluate the vascular anatomy. CONTRAST:  80 mL Isovue 370. COMPARISON:  None. FINDINGS: Mediastinum/Lymph Nodes: The thoracic inlet is within normal limits. No significant hilar or mediastinal adenopathy is noted. Cardiovascular: Pulmonary artery demonstrates a normal branching pattern. No findings to suggest pulmonary emboli are seen. Aortic calcifications are noted without aneurysmal dilatation or dissection. Changes consistent with coronary bypass grafting are noted. Multiple coronary stents are noted within the bypass grafts. Native coronary calcifications are seen. No right heart strain is noted. No pericardial effusion is seen. Lungs/Pleura: Bilateral pleural effusions right greater than left are seen. Bilateral dependent atelectatic changes are noted. No significant pulmonary edema is noted. Upper abdomen: Visualized  upper abdomen is within normal limits. Musculoskeletal: Degenerative changes of the thoracic spine are noted. Review of the MIP images confirms the above findings. IMPRESSION: No evidence of pulmonary emboli. Findings of prior coronary bypass grafting with stenting. No acute abnormality noted. Electronically Signed   By: Inez Catalina M.D.   On: 10/19/2015 21:41    Microbiology: Recent Results (from the past 240 hour(s))  Respiratory Panel by PCR     Status: None   Collection Time: 10/20/15  2:12 AM  Result Value Ref Range Status   Adenovirus NOT DETECTED NOT DETECTED Final   Coronavirus 229E NOT DETECTED NOT DETECTED Final   Coronavirus HKU1 NOT DETECTED NOT DETECTED Final   Coronavirus NL63 NOT DETECTED NOT DETECTED Final   Coronavirus OC43 NOT DETECTED NOT DETECTED Final   Metapneumovirus NOT DETECTED NOT DETECTED Final   Rhinovirus / Enterovirus NOT DETECTED NOT DETECTED Final   Influenza A NOT DETECTED NOT DETECTED Final  Influenza A H1 NOT DETECTED NOT DETECTED Final   Influenza A H1 2009 NOT DETECTED NOT DETECTED Final   Influenza A H3 NOT DETECTED NOT DETECTED Final   Influenza B NOT DETECTED NOT DETECTED Final   Parainfluenza Virus 1 NOT DETECTED NOT DETECTED Final   Parainfluenza Virus 2 NOT DETECTED NOT DETECTED Final   Parainfluenza Virus 3 NOT DETECTED NOT DETECTED Final   Parainfluenza Virus 4 NOT DETECTED NOT DETECTED Final   Respiratory Syncytial Virus NOT DETECTED NOT DETECTED Final   Bordetella pertussis NOT DETECTED NOT DETECTED Final   Chlamydophila pneumoniae NOT DETECTED NOT DETECTED Final   Mycoplasma pneumoniae NOT DETECTED NOT DETECTED Final  Culture, blood (Routine X 2) w Reflex to ID Panel     Status: Abnormal   Collection Time: 10/20/15  6:15 AM  Result Value Ref Range Status   Specimen Description BLOOD RIGHT HAND  Final   Special Requests IN PEDIATRIC BOTTLE 2ML  Final   Culture  Setup Time   Final    GRAM POSITIVE COCCI IN CLUSTERS IN PEDIATRIC  BOTTLE CRITICAL RESULT CALLED TO, READ BACK BY AND VERIFIED WITH: TO KAMEND(PHARD) BY TCLEVELAND 10/21/2015 AT 5:44AM    Culture (A)  Final    STAPHYLOCOCCUS SPECIES (COAGULASE NEGATIVE) THE SIGNIFICANCE OF ISOLATING THIS ORGANISM FROM A SINGLE SET OF BLOOD CULTURES WHEN MULTIPLE SETS ARE DRAWN IS UNCERTAIN. PLEASE NOTIFY THE MICROBIOLOGY DEPARTMENT WITHIN ONE WEEK IF SPECIATION AND SENSITIVITIES ARE REQUIRED.    Report Status 10/23/2015 FINAL  Final  Blood Culture ID Panel (Reflexed)     Status: Abnormal   Collection Time: 10/20/15  6:15 AM  Result Value Ref Range Status   Enterococcus species NOT DETECTED NOT DETECTED Final   Vancomycin resistance NOT DETECTED NOT DETECTED Final   Listeria monocytogenes NOT DETECTED NOT DETECTED Final   Staphylococcus species DETECTED (A) NOT DETECTED Final    Comment: CRITICAL RESULT CALLED TO, READ BACK BY AND VERIFIED WITH: TO KAMENED(PHARD) BY TCLEVELAND 10/21/2015 AT 5:44AM    Staphylococcus aureus NOT DETECTED NOT DETECTED Final   Methicillin resistance NOT DETECTED NOT DETECTED Final   Streptococcus species NOT DETECTED NOT DETECTED Final   Streptococcus agalactiae NOT DETECTED NOT DETECTED Final   Streptococcus pneumoniae NOT DETECTED NOT DETECTED Final   Streptococcus pyogenes NOT DETECTED NOT DETECTED Final   Acinetobacter baumannii NOT DETECTED NOT DETECTED Final   Enterobacteriaceae species NOT DETECTED NOT DETECTED Final   Enterobacter cloacae complex NOT DETECTED NOT DETECTED Final   Escherichia coli NOT DETECTED NOT DETECTED Final   Klebsiella oxytoca NOT DETECTED NOT DETECTED Final   Klebsiella pneumoniae NOT DETECTED NOT DETECTED Final   Proteus species NOT DETECTED NOT DETECTED Final   Serratia marcescens NOT DETECTED NOT DETECTED Final   Carbapenem resistance NOT DETECTED NOT DETECTED Final   Haemophilus influenzae NOT DETECTED NOT DETECTED Final   Neisseria meningitidis NOT DETECTED NOT DETECTED Final   Pseudomonas aeruginosa  NOT DETECTED NOT DETECTED Final   Candida albicans NOT DETECTED NOT DETECTED Final   Candida glabrata NOT DETECTED NOT DETECTED Final   Candida krusei NOT DETECTED NOT DETECTED Final   Candida parapsilosis NOT DETECTED NOT DETECTED Final   Candida tropicalis NOT DETECTED NOT DETECTED Final  Culture, blood (Routine X 2) w Reflex to ID Panel     Status: None   Collection Time: 10/20/15  6:18 AM  Result Value Ref Range Status   Specimen Description BLOOD RIGHT ARM  Final   Special Requests IN PEDIATRIC BOTTLE  1ML  Final   Culture NO GROWTH 5 DAYS  Final   Report Status 10/25/2015 FINAL  Final  MRSA PCR Screening     Status: None   Collection Time: 10/20/15  8:44 AM  Result Value Ref Range Status   MRSA by PCR NEGATIVE NEGATIVE Final    Comment:        The GeneXpert MRSA Assay (FDA approved for NASAL specimens only), is one component of a comprehensive MRSA colonization surveillance program. It is not intended to diagnose MRSA infection nor to guide or monitor treatment for MRSA infections.   Culture, blood (routine x 2)     Status: None   Collection Time: 10/21/15 10:17 AM  Result Value Ref Range Status   Specimen Description BLOOD RIGHT HAND  Final   Special Requests IN PEDIATRIC BOTTLE .5CC  Final   Culture NO GROWTH 5 DAYS  Final   Report Status 10/26/2015 FINAL  Final  Culture, blood (routine x 2)     Status: None   Collection Time: 10/21/15 10:24 AM  Result Value Ref Range Status   Specimen Description BLOOD RIGHT HAND  Final   Special Requests IN PEDIATRIC BOTTLE  .5CC  Final   Culture NO GROWTH 5 DAYS  Final   Report Status 10/26/2015 FINAL  Final     Labs: Basic Metabolic Panel:  Recent Labs Lab 10/26/15 0305 10/27/15 0338 10/28/15 0407 10/28/15 1037 10/29/15 0328  NA 139 135 134* 133* 132*  K 3.8 3.2* 5.8* 4.3 3.3*  CL 91* 88* 91* 90* 91*  CO2 37* 36* 29 35* 34*  GLUCOSE 121* 287* 158* 269* 183*  BUN 34* 31* 33* 29* 26*  CREATININE 0.98 0.98 0.90 0.88  0.88  CALCIUM 9.0 8.8* 8.4* 8.6* 8.5*  MG  --  2.3  --   --   --    Liver Function Tests: No results for input(s): AST, ALT, ALKPHOS, BILITOT, PROT, ALBUMIN in the last 168 hours. No results for input(s): LIPASE, AMYLASE in the last 168 hours. No results for input(s): AMMONIA in the last 168 hours. CBC:  Recent Labs Lab 10/23/15 0442 10/24/15 0533 10/25/15 0418 10/29/15 0328  WBC 12.0* 12.5* 14.5* 17.1*  HGB 8.8* 8.8* 9.6* 11.0*  HCT 31.5* 31.7* 34.6* 38.3*  MCV 70.0* 70.3* 70.9* 71.7*  PLT 285 317 343 310   Cardiac Enzymes: No results for input(s): CKTOTAL, CKMB, CKMBINDEX, TROPONINI in the last 168 hours. BNP: Invalid input(s): POCBNP CBG:  Recent Labs Lab 10/28/15 1135 10/28/15 1636 10/28/15 2136 10/29/15 0623 10/29/15 1140  GLUCAP 271* 173* 153* 161* 268*    Time coordinating discharge:  Greater than 30 minutes  Signed:  Ashad Fawbush, DO [facility] Pager: [phone] 10/29/2015, 1:17 PM

## 2015-10-29 NOTE — Consult Note (Addendum)
[place] Nurse wound re-consult note Initial WOC consult performed on 7/29; refer to previous progress notes.  Requested to re-assess wounds and adjust topical treatment before the patient is transferred to a SNF. Reason for Consult: Left heel unstageable pressure injury; 8X7cm, 90% soft eschar, 10% yellow slough at the wound edges, some tan drainage, strong odor Left anterior foot with stage 3 pressure injury; 5X4X.2cm, 90% red, 10% yellow interspersed throughout, no odor, mod amt yellow drainage Left upper leg with full thickness wound; 1X1.5X.1cm, 90% red, 10% yellow interspersed throughout, no odor, small amt yellow drainage Right heel with 50% red stage 3 pressure injury; 50% yellow slough unstageable pressure injury; 1.8X1.8X.2cm, small amt tan drainage, no odor. Pressure Ulcer POA: Yes Dressing procedure/placement/frequency: Float heels to reduce pressure.  Foam dressing to left leg to promote healing.  Continue calcium alginate to left anterior foot to absorb drainage and promote healing.  Santyl ointment for enzymatic debridement of nonviable tissue to bilat heels.   Please re-consult if further assistance is needed.  Thank-you,  Julien Girt MSN, [place], [place], [place], [place]

## 2015-10-31 ENCOUNTER — Inpatient Hospital Stay (HOSPITAL_COMMUNITY)
Admission: EM | Admit: 2015-10-31 | Discharge: 2015-11-10 | DRG: 853 | Disposition: A | Discharge status: Skilled Nursing Facility | Payer: Medicare Other | Attending: Internal Medicine | Admitting: Internal Medicine

## 2015-10-31 ENCOUNTER — Emergency Department (HOSPITAL_COMMUNITY): Payer: Medicare Other

## 2015-10-31 ENCOUNTER — Encounter (HOSPITAL_COMMUNITY): Payer: Self-pay

## 2015-10-31 DIAGNOSIS — E1149 Type 2 diabetes mellitus with other diabetic neurological complication: Secondary | ICD-10-CM | POA: Diagnosis present

## 2015-10-31 DIAGNOSIS — Z79899 Other long term (current) drug therapy: Secondary | ICD-10-CM

## 2015-10-31 DIAGNOSIS — E11621 Type 2 diabetes mellitus with foot ulcer: Secondary | ICD-10-CM | POA: Diagnosis not present

## 2015-10-31 DIAGNOSIS — Z8673 Personal history of transient ischemic attack (TIA), and cerebral infarction without residual deficits: Secondary | ICD-10-CM | POA: Diagnosis not present

## 2015-10-31 DIAGNOSIS — Z7901 Long term (current) use of anticoagulants: Secondary | ICD-10-CM | POA: Diagnosis not present

## 2015-10-31 DIAGNOSIS — L97509 Non-pressure chronic ulcer of other part of unspecified foot with unspecified severity: Secondary | ICD-10-CM

## 2015-10-31 DIAGNOSIS — Z951 Presence of aortocoronary bypass graft: Secondary | ICD-10-CM

## 2015-10-31 DIAGNOSIS — E118 Type 2 diabetes mellitus with unspecified complications: Secondary | ICD-10-CM

## 2015-10-31 DIAGNOSIS — L89620 Pressure ulcer of left heel, unstageable: Secondary | ICD-10-CM | POA: Diagnosis present

## 2015-10-31 DIAGNOSIS — E1152 Type 2 diabetes mellitus with diabetic peripheral angiopathy with gangrene: Secondary | ICD-10-CM | POA: Diagnosis present

## 2015-10-31 DIAGNOSIS — L97529 Non-pressure chronic ulcer of other part of left foot with unspecified severity: Secondary | ICD-10-CM

## 2015-10-31 DIAGNOSIS — L89610 Pressure ulcer of right heel, unstageable: Secondary | ICD-10-CM | POA: Diagnosis present

## 2015-10-31 DIAGNOSIS — Z6841 Body Mass Index (BMI) 40.0 and over, adult: Secondary | ICD-10-CM | POA: Diagnosis not present

## 2015-10-31 DIAGNOSIS — E1169 Type 2 diabetes mellitus with other specified complication: Secondary | ICD-10-CM | POA: Diagnosis present

## 2015-10-31 DIAGNOSIS — E876 Hypokalemia: Secondary | ICD-10-CM | POA: Diagnosis not present

## 2015-10-31 DIAGNOSIS — E119 Type 2 diabetes mellitus without complications: Secondary | ICD-10-CM | POA: Diagnosis present

## 2015-10-31 DIAGNOSIS — I5022 Chronic systolic (congestive) heart failure: Secondary | ICD-10-CM | POA: Diagnosis not present

## 2015-10-31 DIAGNOSIS — Z8614 Personal history of Methicillin resistant Staphylococcus aureus infection: Secondary | ICD-10-CM

## 2015-10-31 DIAGNOSIS — F419 Anxiety disorder, unspecified: Secondary | ICD-10-CM | POA: Diagnosis present

## 2015-10-31 DIAGNOSIS — R296 Repeated falls: Secondary | ICD-10-CM | POA: Diagnosis present

## 2015-10-31 DIAGNOSIS — Z86711 Personal history of pulmonary embolism: Secondary | ICD-10-CM

## 2015-10-31 DIAGNOSIS — N400 Enlarged prostate without lower urinary tract symptoms: Secondary | ICD-10-CM | POA: Diagnosis present

## 2015-10-31 DIAGNOSIS — I70234 Atherosclerosis of native arteries of right leg with ulceration of heel and midfoot: Secondary | ICD-10-CM | POA: Diagnosis not present

## 2015-10-31 DIAGNOSIS — Z87891 Personal history of nicotine dependence: Secondary | ICD-10-CM

## 2015-10-31 DIAGNOSIS — E785 Hyperlipidemia, unspecified: Secondary | ICD-10-CM | POA: Diagnosis present

## 2015-10-31 DIAGNOSIS — L89613 Pressure ulcer of right heel, stage 3: Secondary | ICD-10-CM | POA: Diagnosis present

## 2015-10-31 DIAGNOSIS — M86672 Other chronic osteomyelitis, left ankle and foot: Secondary | ICD-10-CM | POA: Diagnosis present

## 2015-10-31 DIAGNOSIS — I2510 Atherosclerotic heart disease of native coronary artery without angina pectoris: Secondary | ICD-10-CM | POA: Diagnosis present

## 2015-10-31 DIAGNOSIS — E1165 Type 2 diabetes mellitus with hyperglycemia: Secondary | ICD-10-CM | POA: Diagnosis present

## 2015-10-31 DIAGNOSIS — N179 Acute kidney failure, unspecified: Secondary | ICD-10-CM | POA: Diagnosis present

## 2015-10-31 DIAGNOSIS — R6521 Severe sepsis with septic shock: Secondary | ICD-10-CM | POA: Diagnosis present

## 2015-10-31 DIAGNOSIS — A419 Sepsis, unspecified organism: Secondary | ICD-10-CM | POA: Diagnosis present

## 2015-10-31 DIAGNOSIS — I96 Gangrene, not elsewhere classified: Secondary | ICD-10-CM | POA: Diagnosis not present

## 2015-10-31 DIAGNOSIS — E872 Acidosis: Secondary | ICD-10-CM | POA: Diagnosis present

## 2015-10-31 DIAGNOSIS — I130 Hypertensive heart and chronic kidney disease with heart failure and stage 1 through stage 4 chronic kidney disease, or unspecified chronic kidney disease: Secondary | ICD-10-CM | POA: Diagnosis present

## 2015-10-31 DIAGNOSIS — I70262 Atherosclerosis of native arteries of extremities with gangrene, left leg: Secondary | ICD-10-CM | POA: Diagnosis not present

## 2015-10-31 DIAGNOSIS — I5042 Chronic combined systolic (congestive) and diastolic (congestive) heart failure: Secondary | ICD-10-CM | POA: Diagnosis present

## 2015-10-31 DIAGNOSIS — Z89412 Acquired absence of left great toe: Secondary | ICD-10-CM

## 2015-10-31 DIAGNOSIS — L89159 Pressure ulcer of sacral region, unspecified stage: Secondary | ICD-10-CM | POA: Diagnosis present

## 2015-10-31 DIAGNOSIS — M549 Dorsalgia, unspecified: Secondary | ICD-10-CM | POA: Diagnosis present

## 2015-10-31 DIAGNOSIS — Z955 Presence of coronary angioplasty implant and graft: Secondary | ICD-10-CM

## 2015-10-31 DIAGNOSIS — F329 Major depressive disorder, single episode, unspecified: Secondary | ICD-10-CM | POA: Diagnosis present

## 2015-10-31 DIAGNOSIS — G8929 Other chronic pain: Secondary | ICD-10-CM | POA: Diagnosis present

## 2015-10-31 DIAGNOSIS — Z66 Do not resuscitate: Secondary | ICD-10-CM | POA: Diagnosis present

## 2015-10-31 DIAGNOSIS — K219 Gastro-esophageal reflux disease without esophagitis: Secondary | ICD-10-CM | POA: Diagnosis present

## 2015-10-31 DIAGNOSIS — Z96642 Presence of left artificial hip joint: Secondary | ICD-10-CM | POA: Diagnosis present

## 2015-10-31 DIAGNOSIS — I482 Chronic atrial fibrillation: Secondary | ICD-10-CM | POA: Diagnosis present

## 2015-10-31 DIAGNOSIS — J431 Panlobular emphysema: Secondary | ICD-10-CM | POA: Diagnosis present

## 2015-10-31 DIAGNOSIS — L97519 Non-pressure chronic ulcer of other part of right foot with unspecified severity: Secondary | ICD-10-CM

## 2015-10-31 DIAGNOSIS — R4182 Altered mental status, unspecified: Secondary | ICD-10-CM | POA: Diagnosis present

## 2015-10-31 DIAGNOSIS — IMO0002 Reserved for concepts with insufficient information to code with codable children: Secondary | ICD-10-CM | POA: Diagnosis present

## 2015-10-31 DIAGNOSIS — I252 Old myocardial infarction: Secondary | ICD-10-CM

## 2015-10-31 DIAGNOSIS — Z794 Long term (current) use of insulin: Secondary | ICD-10-CM

## 2015-10-31 DIAGNOSIS — M869 Osteomyelitis, unspecified: Secondary | ICD-10-CM

## 2015-10-31 DIAGNOSIS — I70261 Atherosclerosis of native arteries of extremities with gangrene, right leg: Secondary | ICD-10-CM | POA: Diagnosis present

## 2015-10-31 DIAGNOSIS — D509 Iron deficiency anemia, unspecified: Secondary | ICD-10-CM | POA: Diagnosis present

## 2015-10-31 DIAGNOSIS — L89893 Pressure ulcer of other site, stage 3: Secondary | ICD-10-CM | POA: Diagnosis present

## 2015-10-31 DIAGNOSIS — I70244 Atherosclerosis of native arteries of left leg with ulceration of heel and midfoot: Secondary | ICD-10-CM | POA: Diagnosis not present

## 2015-10-31 DIAGNOSIS — E861 Hypovolemia: Secondary | ICD-10-CM | POA: Diagnosis present

## 2015-10-31 DIAGNOSIS — E1122 Type 2 diabetes mellitus with diabetic chronic kidney disease: Secondary | ICD-10-CM | POA: Diagnosis present

## 2015-10-31 LAB — BASIC METABOLIC PANEL
Anion gap: 11 (ref 5–15)
BUN: 31 mg/dL — ABNORMAL HIGH (ref 6–20)
CALCIUM: 9.2 mg/dL (ref 8.9–10.3)
CO2: 30 mmol/L (ref 22–32)
CREATININE: 1.74 mg/dL — AB (ref 0.61–1.24)
Chloride: 90 mmol/L — ABNORMAL LOW (ref 101–111)
GFR calc non Af Amer: 36 mL/min — ABNORMAL LOW (ref >60)
GFR, EST AFRICAN AMERICAN: 42 mL/min — AB (ref >60)
GLUCOSE: 141 mg/dL — AB (ref 65–99)
Potassium: 4.4 mmol/L (ref 3.5–5.1)
Sodium: 131 mmol/L — ABNORMAL LOW (ref 135–145)

## 2015-10-31 LAB — CBC
HEMATOCRIT: 43.7 % (ref 39.0–52.0)
Hemoglobin: 13 g/dL (ref 13.0–17.0)
MCH: 21.5 pg — ABNORMAL LOW (ref 26.0–34.0)
MCHC: 29.7 g/dL — AB (ref 30.0–36.0)
MCV: 72.4 fL — AB (ref 78.0–100.0)
Platelets: 304 10*3/uL (ref 150–400)
RBC: 6.04 MIL/uL — ABNORMAL HIGH (ref 4.22–5.81)
RDW: 22.8 % — AB (ref 11.5–15.5)
WBC: 22.3 10*3/uL — ABNORMAL HIGH (ref 4.0–10.5)

## 2015-10-31 LAB — I-STAT TROPONIN, ED: Troponin i, poc: 0.01 ng/mL (ref 0.00–0.08)

## 2015-10-31 LAB — POC OCCULT BLOOD, ED: Fecal Occult Bld: NEGATIVE

## 2015-10-31 LAB — URINALYSIS, ROUTINE W REFLEX MICROSCOPIC
BILIRUBIN URINE: NEGATIVE
GLUCOSE, UA: NEGATIVE mg/dL
Hgb urine dipstick: NEGATIVE
KETONES UR: NEGATIVE mg/dL
Nitrite: NEGATIVE
PH: 8.5 — AB (ref 5.0–8.0)
Protein, ur: NEGATIVE mg/dL
Specific Gravity, Urine: 1.012 (ref 1.005–1.030)

## 2015-10-31 LAB — I-STAT CG4 LACTIC ACID, ED
Lactic Acid, Venous: 3.16 mmol/L (ref 0.5–1.9)
Lactic Acid, Venous: 3.61 mmol/L (ref 0.5–1.9)

## 2015-10-31 LAB — URINE MICROSCOPIC-ADD ON

## 2015-10-31 MED ORDER — HYDROCODONE-ACETAMINOPHEN 7.5-325 MG PO TABS
1.0000 | ORAL_TABLET | Freq: Four times a day (QID) | ORAL | Status: DC | PRN
  Administered 2015-11-01 – 2015-11-08 (×11): 1 via ORAL
  Filled 2015-10-31 (×13): qty 1

## 2015-10-31 MED ORDER — DOCUSATE SODIUM 100 MG PO CAPS
100.0000 mg | ORAL_CAPSULE | Freq: Two times a day (BID) | ORAL | Status: DC
  Administered 2015-10-31 – 2015-11-08 (×16): 100 mg via ORAL
  Filled 2015-10-31 (×16): qty 1

## 2015-10-31 MED ORDER — LIDOCAINE 5 % EX PTCH
1.0000 | MEDICATED_PATCH | CUTANEOUS | Status: DC
  Administered 2015-11-01 – 2015-11-10 (×10): 1 via TRANSDERMAL
  Filled 2015-10-31 (×10): qty 1

## 2015-10-31 MED ORDER — INSULIN ASPART 100 UNIT/ML ~~LOC~~ SOLN
3.0000 [IU] | Freq: Three times a day (TID) | SUBCUTANEOUS | Status: DC
  Administered 2015-11-01 (×3): 3 [IU] via SUBCUTANEOUS

## 2015-10-31 MED ORDER — SODIUM CHLORIDE 0.9 % IV SOLN
INTRAVENOUS | Status: DC
  Administered 2015-10-31 – 2015-11-08 (×11): via INTRAVENOUS

## 2015-10-31 MED ORDER — SODIUM CHLORIDE 0.9 % IV SOLN
Freq: Once | INTRAVENOUS | Status: AC
  Administered 2015-10-31: 19:00:00 via INTRAVENOUS

## 2015-10-31 MED ORDER — LINEZOLID 600 MG/300ML IV SOLN
600.0000 mg | Freq: Once | INTRAVENOUS | Status: AC
  Administered 2015-10-31: 600 mg via INTRAVENOUS
  Filled 2015-10-31: qty 300

## 2015-10-31 MED ORDER — DULOXETINE HCL 60 MG PO CPEP
90.0000 mg | ORAL_CAPSULE | Freq: Every day | ORAL | Status: DC
  Administered 2015-11-01 – 2015-11-10 (×9): 90 mg via ORAL
  Filled 2015-10-31 (×9): qty 1

## 2015-10-31 MED ORDER — SODIUM CHLORIDE 0.9 % IV BOLUS (SEPSIS)
500.0000 mL | Freq: Once | INTRAVENOUS | Status: AC
  Administered 2015-10-31: 500 mL via INTRAVENOUS

## 2015-10-31 MED ORDER — LIDOCAINE HCL (PF) 1 % IJ SOLN
INTRAMUSCULAR | Status: AC
  Filled 2015-10-31: qty 5

## 2015-10-31 MED ORDER — PIPERACILLIN-TAZOBACTAM 3.375 G IVPB
3.3750 g | Freq: Three times a day (TID) | INTRAVENOUS | Status: DC
  Administered 2015-11-01 – 2015-11-10 (×26): 3.375 g via INTRAVENOUS
  Filled 2015-10-31 (×31): qty 50

## 2015-10-31 MED ORDER — FERROUS SULFATE 325 (65 FE) MG PO TABS
325.0000 mg | ORAL_TABLET | Freq: Every day | ORAL | Status: DC
  Administered 2015-11-01 – 2015-11-10 (×9): 325 mg via ORAL
  Filled 2015-10-31 (×9): qty 1

## 2015-10-31 MED ORDER — LORAZEPAM 0.5 MG PO TABS: 0.2500 mg | ORAL_TABLET | Freq: Every day | ORAL | Status: DC | PRN

## 2015-10-31 MED ORDER — POLYVINYL ALCOHOL 1.4 % OP SOLN
1.0000 [drp] | Freq: Two times a day (BID) | OPHTHALMIC | Status: DC
  Administered 2015-10-31 – 2015-11-10 (×15): 1 [drp] via OPHTHALMIC
  Filled 2015-10-31 (×3): qty 15

## 2015-10-31 MED ORDER — INSULIN ASPART 100 UNIT/ML ~~LOC~~ SOLN
0.0000 [IU] | Freq: Three times a day (TID) | SUBCUTANEOUS | Status: DC
  Administered 2015-11-01 (×2): 5 [IU] via SUBCUTANEOUS
  Administered 2015-11-02: 2 [IU] via SUBCUTANEOUS
  Administered 2015-11-02: 3 [IU] via SUBCUTANEOUS
  Administered 2015-11-02 – 2015-11-03 (×2): 2 [IU] via SUBCUTANEOUS
  Administered 2015-11-04 – 2015-11-05 (×3): 3 [IU] via SUBCUTANEOUS
  Administered 2015-11-05: 2 [IU] via SUBCUTANEOUS
  Administered 2015-11-06: 3 [IU] via SUBCUTANEOUS
  Administered 2015-11-06: 2 [IU] via SUBCUTANEOUS
  Administered 2015-11-07: 3 [IU] via SUBCUTANEOUS
  Administered 2015-11-08 (×2): 2 [IU] via SUBCUTANEOUS
  Administered 2015-11-09 (×3): 3 [IU] via SUBCUTANEOUS
  Administered 2015-11-10 (×2): 2 [IU] via SUBCUTANEOUS

## 2015-10-31 MED ORDER — LUBIPROSTONE 8 MCG PO CAPS
8.0000 ug | ORAL_CAPSULE | Freq: Two times a day (BID) | ORAL | Status: DC
  Administered 2015-11-01 – 2015-11-10 (×16): 8 ug via ORAL
  Filled 2015-10-31 (×20): qty 1

## 2015-10-31 MED ORDER — HEPARIN (PORCINE) IN NACL 100-0.45 UNIT/ML-% IJ SOLN
1300.0000 [IU]/h | INTRAMUSCULAR | Status: DC
  Administered 2015-11-01: 1300 [IU]/h via INTRAVENOUS
  Filled 2015-10-31: qty 250

## 2015-10-31 MED ORDER — ATORVASTATIN CALCIUM 40 MG PO TABS
40.0000 mg | ORAL_TABLET | Freq: Every day | ORAL | Status: DC
  Administered 2015-11-01 – 2015-11-09 (×9): 40 mg via ORAL
  Filled 2015-10-31 (×9): qty 1

## 2015-10-31 MED ORDER — TRAZODONE HCL 100 MG PO TABS
100.0000 mg | ORAL_TABLET | Freq: Every day | ORAL | Status: DC
  Administered 2015-10-31 – 2015-11-07 (×8): 100 mg via ORAL
  Filled 2015-10-31: qty 2
  Filled 2015-10-31 (×2): qty 1
  Filled 2015-10-31: qty 2
  Filled 2015-10-31 (×2): qty 1
  Filled 2015-10-31 (×3): qty 2
  Filled 2015-10-31: qty 1

## 2015-10-31 MED ORDER — GLUCERNA SHAKE PO LIQD
237.0000 mL | ORAL | Status: DC
  Administered 2015-11-01 – 2015-11-10 (×7): 237 mL via ORAL

## 2015-10-31 MED ORDER — INSULIN NPH (HUMAN) (ISOPHANE) 100 UNIT/ML ~~LOC~~ SUSP
18.0000 [IU] | Freq: Two times a day (BID) | SUBCUTANEOUS | Status: DC
  Administered 2015-10-31 – 2015-11-10 (×18): 18 [IU] via SUBCUTANEOUS
  Filled 2015-10-31 (×4): qty 10

## 2015-10-31 MED ORDER — SODIUM CHLORIDE 0.9 % IV BOLUS (SEPSIS)
2500.0000 mL | Freq: Once | INTRAVENOUS | Status: AC
  Administered 2015-10-31: 2500 mL via INTRAVENOUS

## 2015-10-31 MED ORDER — PANTOPRAZOLE SODIUM 40 MG PO TBEC
80.0000 mg | DELAYED_RELEASE_TABLET | Freq: Every day | ORAL | Status: DC
  Administered 2015-10-31 – 2015-11-10 (×10): 80 mg via ORAL
  Filled 2015-10-31 (×10): qty 2

## 2015-10-31 MED ORDER — PIPERACILLIN-TAZOBACTAM 3.375 G IVPB 30 MIN
3.3750 g | Freq: Once | INTRAVENOUS | Status: AC
  Administered 2015-10-31: 3.375 g via INTRAVENOUS
  Filled 2015-10-31: qty 50

## 2015-10-31 MED ORDER — SODIUM CHLORIDE 0.9% FLUSH
3.0000 mL | Freq: Two times a day (BID) | INTRAVENOUS | Status: DC
  Administered 2015-10-31 – 2015-11-09 (×10): 3 mL via INTRAVENOUS

## 2015-10-31 MED ORDER — FENTANYL CITRATE (PF) 100 MCG/2ML IJ SOLN
25.0000 ug | INTRAMUSCULAR | Status: DC | PRN
  Administered 2015-11-01 – 2015-11-10 (×9): 50 ug via INTRAVENOUS
  Administered 2015-11-10: 25 ug via INTRAVENOUS
  Filled 2015-10-31 (×11): qty 2

## 2015-10-31 MED ORDER — VANCOMYCIN HCL 10 G IV SOLR
1250.0000 mg | INTRAVENOUS | Status: DC
  Administered 2015-10-31 – 2015-11-01 (×2): 1250 mg via INTRAVENOUS
  Filled 2015-10-31 (×3): qty 1250

## 2015-10-31 MED ORDER — GABAPENTIN 400 MG PO CAPS
400.0000 mg | ORAL_CAPSULE | Freq: Three times a day (TID) | ORAL | Status: DC
  Administered 2015-10-31 – 2015-11-10 (×28): 400 mg via ORAL
  Filled 2015-10-31 (×28): qty 1

## 2015-10-31 NOTE — ED Triage Notes (Signed)
Per EMS - pt from [facility]. Pt had 2 syncopal episodes this afternoon. Assisted from wheelchair to bed when trying to stand up. BP 118/80 for staff. Initial BP for EMS 68/34. Hr 90bpm. 12-lead LBB, NSR. Recently admitted for CHF exacerbation and resp failure. Has decubitus ulcers on feet .

## 2015-10-31 NOTE — Progress Notes (Signed)
ANTICOAGULATION CONSULT NOTE - Initial Consult  Pharmacy Consult for heparin Indication: atrial fibrillation  Allergies  Allergen Reactions  . Diazepam Anxiety    REACTION: makes patient cry  . Vancomycin Other (See Comments)    "red man syndrome"    Patient Measurements: Height: 5\' 8"  (172.7 cm) Weight: 259 lb (117.5 kg) IBW/kg (Calculated) : 68.4 Heparin Dosing Weight: 95kg  Vital Signs: Temp: 97.4 F (36.3 C) (08/09 1542) Temp Source: Oral (08/09 1542) BP: 74/60 (08/09 2100) Pulse Rate: 92 (08/09 2100)  Labs:  Recent Labs  10/29/15 0328 10/31/15 1608  HGB 11.0* 13.0  HCT 38.3* 43.7  PLT 310 304  CREATININE 0.88 1.74*    Estimated Creatinine Clearance: 45 mL/min (by C-G formula based on SCr of 1.74 mg/dL).   Medical History: Past Medical History:  Diagnosis Date  . Adhesive capsulitis   . Angina   . Anxiety   . CAD (coronary artery disease)   . CHF (congestive heart failure) ([place])   . Chronic back pain   . Chronic kidney disease    hx of BPH  . COPD (chronic obstructive pulmonary disease) ([place])   . CVA (cerebral infarction) Questionable history  . Depression   . Diabetes mellitus   . Diverticulosis   . Falls frequently 06/2014  . GERD (gastroesophageal reflux disease)   . Hyperlipidemia   . Hypertension   . 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 Myocardial infarction ([place])   . Neuromuscular disorder ([place])    HX of diabetic periferal neuropathy  . On home oxygen therapy    "1.5L prn" (04/12/2014)  . Osteomyelitis ([place]) 2012   Sternoclavicular joint   . PE (pulmonary embolism) 10-15 years ago   Lifelong Coumadin  . Peripheral vascular disease ([place])   . Shortness of breath   . Sleep apnea     Medications:  Infusions:  . sodium chloride    . heparin    . [START ON 11/01/2015] piperacillin-tazobactam (ZOSYN)  IV    . vancomycin      Assessment: 70 yom presented to the ED with syncope. He is on  chronic xarelto for history of afib but switching to heparin for now. H/H and platelets are WNL. No bleeding noted.   Goal of Therapy:  Heparin level 0.3-0.7 units/ml aPTT 66-102 seconds Monitor platelets by anticoagulation protocol: Yes   Plan:  - Check baseline aPTT and heparin level - After labs are drawn, start heparin gtt 1300 units/hr - Check an 8 hr aPTT - Daily aPTT, CBC and heparin level  [name], [name] 10/31/2015,10:05 PM

## 2015-10-31 NOTE — Consult Note (Signed)
PULMONARY / CRITICAL CARE MEDICINE   Name: [name] MRN: [id number] DOB: January 21, 1941    ADMISSION DATE:  10/31/2015 CONSULTATION DATE:  10/31/2015  REFERRING MD:  Dr. Cathleen Fears  CHIEF COMPLAINT:  Syncope  HISTORY OF PRESENT ILLNESS:   75 year old male who is morbidly obese and has multiple chronic medical issues including systolic heart failure came to the emergency department on 10/31/2015 after having syncope in his nursing home. He and his wife provides the history jointly. He was just discharged from our hospital 2 days ago after being treated for a COPD exacerbation and congestive heart failure exacerbation. During that time he was treated with aggressive diuresis and his diuretic medicines were adjusted on discharge. He states that on the day of admission he started feeling somewhat weak and had headache and some neck pain. He denied nausea, vomiting, diarrhea or belly pain. He then had an episode of passing out while sitting in a chair in his nursing home so he was sent to [facility] for further evaluation. Here he was noted to have leukocytosis, hypotension, and acute kidney injury. He's got chronic wounds on his legs which he says have not changed recently. He also has sacral decubitus ulcers. His wife says he has not walked in over a year. He confirms to me that he does not want aggressive resuscitation with life support or CPR.  PAST MEDICAL HISTORY :  He  has a past medical history of Adhesive capsulitis; Angina; Anxiety; CAD (coronary artery disease); CHF (congestive heart failure) ([place]); Chronic back pain; Chronic kidney disease; COPD (chronic obstructive pulmonary disease) ([facility]); CVA (cerebral infarction) (Questionable history); Depression; Diabetes mellitus; Diverticulosis; Falls frequently (06/2014); GERD (gastroesophageal reflux disease); Hyperlipidemia; Hypertension; MRSA bacteremia; Myocardial infarction [facility]); Neuromuscular disorder ([place]); On home oxygen therapy;  Osteomyelitis ([place]) (2012); PE (pulmonary embolism) (10-15 years ago); Peripheral vascular disease ([place]); Shortness of breath; and Sleep apnea.  PAST SURGICAL HISTORY: He  has a past surgical history that includes septic arthritis; Coronary artery bypass graft (1992); Fracture surgery (1980s); Spine surgery (2002); Coronary stent placement (1999, 2002); doppler echocardiography (Sept 2011); Colonoscopy (2006 ); Right iliopopliteal bypass - 1983 (1983); Femoral-popliteal Bypass Graft (2008); Cardiac catheterization (October 18, 2010); Cardiac catheterization (03/17/2005); US ECHOCARDIOGRAPHY (03/04/2006); Amputation (03/16/2011); and Hip Arthroplasty (Left).  Allergies  Allergen Reactions  . Vancomycin Other (See Comments)    "red man syndrome"  . Diazepam Anxiety    REACTION: makes patient cry    No current facility-administered medications on file prior to encounter.    Current Outpatient Prescriptions on File Prior to Encounter  Medication Sig  . carvedilol (COREG) 3.125 MG tablet Take 1 tablet (3.125 mg total) by mouth 2 (two) times daily with a meal.  . docusate sodium (COLACE) 100 MG capsule Take 100 mg by mouth 2 (two) times daily.  . furosemide (LASIX) 80 MG tablet Take 1 tablet (80 mg total) by mouth 2 (two) times daily.  Marland Kitchen gabapentin (NEURONTIN) 400 MG capsule Take 400 mg by mouth 3 (three) times daily.  Marland Kitchen HYDROcodone-acetaminophen (NORCO) 7.5-325 MG tablet Take 1 tablet by mouth every 6 (six) hours as needed for moderate pain.  Marland Kitchen insulin NPH Human (HUMULIN N,NOVOLIN N) 100 UNIT/ML injection Inject 0.24 mLs (24 Units total) into the skin 2 (two) times daily before a meal.  . Propylene Glycol (SYSTANE BALANCE) 0.6 % SOLN Place 1 drop into both eyes 2 (two) times daily.   Marland Kitchen PROTEIN PO Take 60 mLs by mouth 3 (three) times daily.   Marland Kitchen  DULoxetine (CYMBALTA) 30 MG capsule Take 90 mg by mouth daily.  . feeding supplement, GLUCERNA SHAKE, (GLUCERNA SHAKE) LIQD Take 237 mLs by mouth daily.  .  ferrous sulfate 325 (65 FE) MG tablet Take 1 tablet (325 mg total) by mouth daily with breakfast.  . insulin lispro (HUMALOG KWIKPEN) 100 UNIT/ML KiwkPen Inject 8 Units into the skin 3 (three) times daily before meals. An additional if CBG is 0-149=0 units, 150-400= 5 units before meals and at bedtime  . lidocaine (LIDODERM) 5 % Place 1 patch onto the skin daily.   Marland Kitchen lisinopril (PRINIVIL,ZESTRIL) 2.5 MG tablet Take 2.5 mg by mouth daily.   Marland Kitchen LORazepam (ATIVAN) 0.5 MG tablet Take 0.5 tablets (0.25 mg total) by mouth daily as needed for anxiety.  Marland Kitchen lubiprostone (AMITIZA) 8 MCG capsule Take 8 mcg by mouth 2 (two) times daily with a meal.  . Multiple Vitamins-Minerals (DECUBI-VITE PO) Take 1 tablet by mouth daily.   . nitroGLYCERIN (NITROSTAT) 0.4 MG SL tablet Place 1 tablet (0.4 mg total) under the tongue as needed. For chest pains  . omeprazole (PRILOSEC) 40 MG capsule Take 1 capsule (40 mg total) by mouth daily.  . rivaroxaban (XARELTO) 20 MG TABS tablet Take 20 mg by mouth daily with supper.  . simvastatin (ZOCOR) 80 MG tablet Take 1 tablet (80 mg total) by mouth daily.  Marland Kitchen spironolactone (ALDACTONE) 25 MG tablet Take 1 tablet (25 mg total) by mouth daily.  . tamsulosin (FLOMAX) 0.4 MG CAPS capsule Take 1 capsule (0.4 mg total) by mouth daily after supper.  . traZODone (DESYREL) 100 MG tablet Take 100 mg by mouth at bedtime.   . Vitamin D, Ergocalciferol, (DRISDOL) 50000 units CAPS capsule Take 1 capsule (50,000 Units total) by mouth every 7 (seven) days.    FAMILY HISTORY:  His indicated that his mother is deceased. He indicated that his father is deceased.    SOCIAL HISTORY: He  reports that he quit smoking about 15 years ago. He has never used smokeless tobacco. He reports that he does not drink alcohol or use drugs.  REVIEW OF SYSTEMS:   Gen: Denies fever, chills, weight change, + fatigue, night sweats HEENT: Denies blurred vision, double vision, hearing loss, tinnitus, sinus congestion,  rhinorrhea, sore throat, neck stiffness, dysphagia PULM: Denies shortness of breath, Rare cough, no sputum production, hemoptysis, wheezing CV: Denies chest pain, + edema, orthopnea, paroxysmal nocturnal dyspnea, palpitations GI: Denies abdominal pain, nausea, vomiting, diarrhea, hematochezia, melena, constipation, change in bowel habits GU: Denies dysuria, hematuria, polyuria, oliguria, urethral discharge Endocrine: Denies hot or cold intolerance, polyuria, polyphagia or appetite change Derm: wound left leg unchanged, no rash, dry skin, scaling or peeling skin change Heme: Denies easy bruising, bleeding, bleeding gums Neuro:+ headache, denies numbness, weakness, slurred speech, loss of memory or consciousness   SUBJECTIVE:  As above  VITAL SIGNS: BP 98/63   Pulse 94   Temp 97.4 F (36.3 C) (Oral)   Resp 13   Ht 5\' 8"  (1.727 m)   Wt 117.5 kg (259 lb)   SpO2 99%   BMI 39.38 kg/m   HEMODYNAMICS:    VENTILATOR SETTINGS:    INTAKE / OUTPUT: I/O last 3 completed shifts: In: -  Out: 50 [Urine:50]  PHYSICAL EXAMINATION: General:  Chronically ill-appearing, morbidly obese, resting comfortably in bed Neuro:  Awake, alert, oriented 4, moves all 4 extremities HEENT:  Normocephalic, very thick neck, oropharynx clear Cardiovascular:  Regular rate and rhythm, distant heart sounds, no clear murmur, cannot  assess JVD secondary to obesity Lungs:  Clear to auscultation bilaterally with normal effort Abdomen:  Bowel sounds positive, nontender nondistended Musculoskeletal:  Normal bulk and tone Skin:  Dusky gray changes to skin over left leg with ulceration over left foot, no surrounding erythema, healthy-appearing tissue underneath, second wound on left leg as well well-dressed, also right heel wound well dressed,  LABS:  BMET  Recent Labs Lab 10/28/15 1037 10/29/15 0328 10/31/15 1608  NA 133* 132* 131*  K 4.3 3.3* 4.4  CL 90* 91* 90*  CO2 35* 34* 30  BUN 29* 26* 31*   CREATININE 0.88 0.88 1.74*  GLUCOSE 269* 183* 141*    Electrolytes  Recent Labs Lab 10/27/15 0338  10/28/15 1037 10/29/15 0328 10/31/15 1608  CALCIUM 8.8*  < > 8.6* 8.5* 9.2  MG 2.3  --   --   --   --   < > = values in this interval not displayed.  CBC  Recent Labs Lab 10/25/15 0418 10/29/15 0328 10/31/15 1608  WBC 14.5* 17.1* 22.3*  HGB 9.6* 11.0* 13.0  HCT 34.6* 38.3* 43.7  PLT 343 310 304    Coag's No results for input(s): APTT, INR in the last 168 hours.  Sepsis Markers  Recent Labs Lab 10/31/15 1652 10/31/15 1915  LATICACIDVEN 3.16* 3.61*    ABG No results for input(s): PHART, PCO2ART, PO2ART in the last 168 hours.  Liver Enzymes No results for input(s): AST, ALT, ALKPHOS, BILITOT, ALBUMIN in the last 168 hours.  Cardiac Enzymes No results for input(s): TROPONINI, PROBNP in the last 168 hours.  Glucose  Recent Labs Lab 10/28/15 0550 10/28/15 1135 10/28/15 1636 10/28/15 2136 10/29/15 0623 10/29/15 1140  GLUCAP 175* 271* 173* 153* 161* 268*    Imaging Ct Head Wo Contrast  Result Date: 10/31/2015 CLINICAL DATA:  On blood thinner.  Headache. EXAM: CT HEAD WITHOUT CONTRAST TECHNIQUE: Contiguous axial images were obtained from the base of the skull through the vertex without intravenous contrast. COMPARISON:  08/06/2014 FINDINGS: Brain: No evidence of acute infarction, hemorrhage, hydrocephalus, or mass lesion/mass effect. Extensive remote left MCA territory infarction, upper division, extending into the posterior border zone. Remote small vessel infarcts in the bilateral cerebellum. Vascular: Atherosclerotic calcifications Skull: Negative for fracture or focal lesion. Sinuses/Orbits: Bilateral cataract resection. Other: None. IMPRESSION: 1. No acute finding or change compared to prior. 2. Remote left MCA territory infarction. Remote small bilateral cerebellar infarcts. Electronically Signed   By: Monte Fantasia M.D.   On: 10/31/2015 17:11   Dg  Chest Port 1 View  Result Date: 10/31/2015 CLINICAL DATA:  Hypotension EXAM: PORTABLE CHEST 1 VIEW COMPARISON:  10/19/2015 FINDINGS: Cardiac shadow is enlarged. Postsurgical changes are again seen. The lungs are well aerated bilaterally. Mild interstitial changes are again seen without acute abnormality. IMPRESSION: No acute abnormality noted. Electronically Signed   By: Inez Catalina M.D.   On: 10/31/2015 18:01     STUDIES:  10/31/2015 CT head no acute intracranial process  CULTURES: 10/31/2015 blood culture 10/31/2015 urine culture  ANTIBIOTICS: 10/31/2015 zosyn >  10/31/2015 doxycycline >   SIGNIFICANT EVENTS:   LINES/TUBES:   DISCUSSION: 75 year old male who is chronically ill with morbid obesity, COPD, multiple wounds, it has been bedbound for a year comes from his nursing home this evening with syncope in the setting of shock, likely septic. He appears to be remarkably volume responsive so I think it's very likely that the aggressive diuresis from his recent hospitalization is contributing to today's hypotension. Today  his hypotension is now resolved that he continues to have a mild lactic acidosis. In fact now on exam he appears to be volume replete. There are no other findings on exam or history to suggest a cause of his lactic acidosis other than his mild renal failure and hypotension. My suspicion is that the next lactic acid will show improvement. Overall, his prognosis is poor considering his multiple comorbid illnesses. I agree completely with his stated wishes to be DO NOT RESUSCITATE.  In terms of possible sources of sepsis on most concerned that he may have osteomyelitis based on the appearance of his left leg wound. The granulation tissue appears healthy, I worry that its lack of healing may have led to osteomyelitis.  ASSESSMENT / PLAN:  PULMONARY A: COPD, not an exacerbation P:   Continue home bronchodilators Titrate O2 as needed to maintain O2 saturation greater than  90%  CARDIOVASCULAR A:  Septic shock, resolving Systolic heart failure P:  Telemetry monitoring Would hold further IV resuscitation after 30 mL of saline per kilogram Hold home antihypertensives and diuretics for today Repeat lactic acid  RENAL A:   Acute kidney injury, likely due to hypovolemia P:   Repeat basic metabolic panel after fluid resuscitation Monitor urine output  GASTROINTESTINAL A:   No acute issues P:   Check LFTs given lactic acid elevation  HEMATOLOGIC A:   History of PE on Xarelto P:  Continue Xarelto Monitor for bleeding  INFECTIOUS A:   Cellulitis, chronic wound infection left leg, worrisome for osteomyelitis P:   Start IV Zosyn considering his diabetes and risk for pseudomonas Start IV doxycycline to cover MRSA Consider MRI of leg versus other imaging to evaluate for osteomyelitis  ENDOCRINE A:   Diabetes mellitus type 2   P:   Would recommend continuing subcutaneous insulin  NEUROLOGIC A:   No acute issues P:      FAMILY  - Updates: Wife updated bedside  CODE STATUS is DO NOT RESUSCITATE, no mechanical ventilation I confirm this with him.  Based on the fact that his shock has resolved pulmonary and critical care medicine will sign off, recommend asking the hospitalist service to admit.  Roselie Awkward, MD [facility] Pager: [phone] Cell: [phone] After 3pm or if no response, call [phone]   10/31/2015, 8:39 PM

## 2015-10-31 NOTE — ED Notes (Signed)
RN noted right neck area around external jugular IV site to be swollen and cold to touch. IV fluids d/c at this time. Admitting physician at bedside to assess the site. Pt. Reporting pain to that area. IV taken out at this time. EDP also advised of this. IV team consult put in.

## 2015-10-31 NOTE — Progress Notes (Signed)
Unable to locate vein for PIV even with ultrasoundl.

## 2015-10-31 NOTE — ED Notes (Addendum)
Unable to get blood cultures. IV team ordered at 1708 STAT order and phlebotomy contacted about getting blood. Delay in abx start due to this. EDP notified.

## 2015-10-31 NOTE — ED Notes (Signed)
IV team reports being unable to find IV access. Phlebotomy at bedside attempting cultures now.

## 2015-10-31 NOTE — ED Provider Notes (Signed)
[facility] DEPT Provider Note   CSN: [id number] Arrival date & time: 10/31/15  1528  First Provider Contact:  First MD Initiated Contact with Patient 10/31/15 1533        History   Chief Complaint Chief Complaint  Patient presents with  . Loss of Consciousness    HPI [name] is a 75 y.o. male.Patient had syncopal event today 2. Brought from nursing home. He presently complains of bilateral leg pain which has been there for "a long time, and headache. He denies shortness of breath denies chest pain. Denies abdominal pain. No other associated symptoms. No treatment prior to coming here. Noted be hypotensive in the field and patient was discharged from here 10/29/2015. He was diuresed and diuretics were increased while here.  HPI  Past Medical History:  Diagnosis Date  . Adhesive capsulitis   . Angina   . Anxiety   . CAD (coronary artery disease)   . CHF (congestive heart failure) ([place])   . Chronic back pain   . Chronic kidney disease    hx of BPH  . COPD (chronic obstructive pulmonary disease) ([place])   . CVA (cerebral infarction) Questionable history  . Depression   . Diabetes mellitus   . Diverticulosis   . Falls frequently 06/2014  . GERD (gastroesophageal reflux disease)   . Hyperlipidemia   . Hypertension   . 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 Myocardial infarction ([place])   . Neuromuscular disorder ([place])    HX of diabetic periferal neuropathy  . On home oxygen therapy    "1.5L prn" (04/12/2014)  . Osteomyelitis ([place]) 2012   Sternoclavicular joint   . PE (pulmonary embolism) 10-15 years ago   Lifelong Coumadin  . Peripheral vascular disease ([place])   . Shortness of breath   . Sleep apnea     Patient Active Problem List   Diagnosis Date Noted  . Hypokalemia   . Pressure ulcer 10/24/2015  . Dyspnea   . Acute on chronic congestive heart failure ([place])   . Acute on chronic systolic (congestive) heart  failure ([place]) 10/19/2015  . Acute on chronic respiratory failure with hypoxia ([place]) 10/19/2015  . Microcytic anemia 10/19/2015  . Atherosclerosis of native arteries of the extremities with ulceration ([place]) 08/03/2015  . Low back pain with sciatica 07/30/2015  . 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 Diabetic peripheral neuropathy associated with type 2 diabetes mellitus ([place]) 06/18/2015  . Allergic rhinitis 05/22/2015  . Cellulitis of left lower leg 05/22/2015  . Venous ulcer of left leg ([place]) 05/22/2015  . Constipation due to opioid therapy 05/06/2015  . Vitamin D deficiency 05/03/2015  . Type II diabetes mellitus with neurological manifestations ([place]) 02/15/2015  . Dyslipidemia associated with type 2 diabetes mellitus ([place]) 02/15/2015  . Bilateral edema of lower extremity   . Dehydration 07/18/2014  . Diarrhea 07/18/2014  . Benign paroxysmal positional vertigo 05/26/2014  . OA (osteoarthritis) 05/12/2014  . Unsteady gait 04/12/2014  . Orthostatic hypotension 05/16/2013  . Generalized weakness 04/30/2013  . At high risk for falls 07/19/2012  . Falls 02/17/2012  . Bradycardia 01/22/2012  . Hemorrhoid 01/12/2012  . GERD (gastroesophageal reflux disease) 11/10/2011  . Leg edema 09/20/2011  . Peripheral vascular disease ([place]) 04/16/2011  . COPD exacerbation ([place]) 02/27/2011  . Stasis dermatitis 12/04/2010  . BPH (benign prostatic hyperplasia) 11/01/2010  . Long term current use of anticoagulant therapy 05/03/2010  . FROZEN RIGHT SHOULDER 02/25/2010  .  Depression 07/08/2006  . Essential hypertension 07/08/2006  . HYPERCHOLESTEROLEMIA 05/21/2006  . Anxiety state 05/21/2006  . Coronary atherosclerosis 05/21/2006  . Chronic systolic heart failure ([place]) 05/21/2006  . APNEA, SLEEP 05/21/2006    Past Surgical History:  Procedure Laterality Date  . AMPUTATION  03/16/2011   Procedure: AMPUTATION DIGIT;  Surgeon: Angelia Mould, MD;  Location: [facility] OR;  Service:  Vascular;  Laterality: Left;  Great toe  . CARDIAC CATHETERIZATION  October 18, 2010   Known obstructive disease, no further blockages  . CARDIAC CATHETERIZATION  03/17/2005   EF 35-40%  . COLONOSCOPY  2006    Multiple polyps removed, repeat in 5 years  . CORONARY ARTERY BYPASS GRAFT  1992  . [place], 2002  . DOPPLER ECHOCARDIOGRAPHY  Sept 2011   EF 50-55% with some impaired diastolic relaxation  . FEMORAL-POPLITEAL BYPASS GRAFT  2008   Left  . FRACTURE SURGERY  1980s   Hip  . HIP ARTHROPLASTY Left   . Right iliopopliteal bypass - [place]  . septic arthritis     Removal of infected CABG wire by Dr Arlyce Dice - 2011  . SPINE SURGERY  2002   Cervical fusion vertebroplasty C3-4-5  . US ECHOCARDIOGRAPHY  03/04/2006   EF 50-55%       Home Medications    Prior to Admission medications   Medication Sig Start Date End Date Taking? Authorizing Provider  carvedilol (COREG) 3.125 MG tablet Take 1 tablet (3.125 mg total) by mouth 2 (two) times daily with a meal. 10/29/15   Orson Eva, MD  docusate sodium (COLACE) 100 MG capsule Take 100 mg by mouth 2 (two) times daily.    Historical Provider, MD  DULoxetine (CYMBALTA) 30 MG capsule Take 90 mg by mouth daily.    Historical Provider, MD  feeding supplement, GLUCERNA SHAKE, (GLUCERNA SHAKE) LIQD Take 237 mLs by mouth daily. 10/29/15   Orson Eva, MD  ferrous sulfate 325 (65 FE) MG tablet Take 1 tablet (325 mg total) by mouth daily with breakfast. 10/29/15   Orson Eva, MD  furosemide (LASIX) 80 MG tablet Take 1 tablet (80 mg total) by mouth 2 (two) times daily. 10/29/15   Orson Eva, MD  gabapentin (NEURONTIN) 400 MG capsule Take 400 mg by mouth 3 (three) times daily.    Historical Provider, MD  HYDROcodone-acetaminophen (NORCO) 7.5-325 MG tablet Take 1 tablet by mouth every 6 (six) hours as needed for moderate pain. 10/29/15   Orson Eva, MD  insulin lispro (HUMALOG KWIKPEN) 100 UNIT/ML KiwkPen Inject 8 Units into the skin 3 (three)  times daily before meals. An additional if CBG is 0-149=0 units, 150-400= 5 units before meals and at bedtime    Historical Provider, MD  insulin NPH Human (HUMULIN N,NOVOLIN N) 100 UNIT/ML injection Inject 0.24 mLs (24 Units total) into the skin 2 (two) times daily before a meal. 10/29/15   Orson Eva, MD  lidocaine (LIDODERM) 5 % Place 1 patch onto the skin daily.     Historical Provider, MD  lisinopril (PRINIVIL,ZESTRIL) 2.5 MG tablet Take 2.5 mg by mouth daily.  06/06/14   Historical Provider, MD  LORazepam (ATIVAN) 0.5 MG tablet Take 0.5 tablets (0.25 mg total) by mouth daily as needed for anxiety. 10/29/15   Orson Eva, MD  lubiprostone (AMITIZA) 8 MCG capsule Take 8 mcg by mouth 2 (two) times daily with a meal.    Historical Provider, MD  Multiple Vitamins-Minerals (DECUBI-VITE PO) Take 1 tablet by  mouth daily.     Historical Provider, MD  nitroGLYCERIN (NITROSTAT) 0.4 MG SL tablet Place 1 tablet (0.4 mg total) under the tongue as needed. For chest pains 10/19/13   Alveda Reasons, MD  omeprazole (PRILOSEC) 40 MG capsule Take 1 capsule (40 mg total) by mouth daily. 05/10/14   Alveda Reasons, MD  Propylene Glycol (SYSTANE BALANCE) 0.6 % SOLN Place 1 drop into both eyes 2 (two) times daily.     Historical Provider, MD  PROTEIN PO Take 60 mLs by mouth 2 (two) times daily.    Historical Provider, MD  rivaroxaban (XARELTO) 20 MG TABS tablet Take 20 mg by mouth daily with supper.    Historical Provider, MD  simvastatin (ZOCOR) 80 MG tablet Take 1 tablet (80 mg total) by mouth daily. 06/06/14   Alveda Reasons, MD  spironolactone (ALDACTONE) 25 MG tablet Take 1 tablet (25 mg total) by mouth daily. 10/29/15   Orson Eva, MD  tamsulosin (FLOMAX) 0.4 MG CAPS capsule Take 1 capsule (0.4 mg total) by mouth daily after supper. 10/19/13   Alveda Reasons, MD  traZODone (DESYREL) 100 MG tablet Take 100 mg by mouth at bedtime.     Historical Provider, MD  Vitamin D, Ergocalciferol, (DRISDOL) 50000 units CAPS capsule  Take 1 capsule (50,000 Units total) by mouth every 7 (seven) days. 05/03/15   Gerlene Fee, NP    Family History Family History  Problem Relation Age of Onset  . Heart disease Father   . Heart disease Mother     Social History Social History  Substance Use Topics  . Smoking status: Former Smoker    Quit date: 06/19/2000  . Smokeless tobacco: Never Used  . Alcohol use No     Allergies   Vancomycin and Diazepam   Review of Systems Review of Systems  Musculoskeletal: Positive for myalgias.       Leg pain. Chronic  Skin:       Chronic wounds on legs  Neurological: Positive for weakness and headaches.  All other systems reviewed and are negative.    Physical Exam Updated Vital Signs BP (!) 85/62 (BP Location: Right Arm)   Pulse 89   Temp 97.4 F (36.3 C) (Oral)   Resp 13   Ht 5\' 8"  (1.727 m)   Wt 259 lb (117.5 kg)   SpO2 100%   BMI 39.38 kg/m   Physical Exam  Constitutional: No distress.  Chronically ill-appearing  HENT:  Head: Normocephalic and atraumatic.  Eyes: Conjunctivae are normal. Pupils are equal, round, and reactive to light.  Neck: Neck supple. No tracheal deviation present. No thyromegaly present.  Cardiovascular: Normal rate and regular rhythm.   No murmur heard. Pulmonary/Chest: Effort normal and breath sounds normal.  Abdominal: Soft. Bowel sounds are normal. He exhibits no distension. There is no tenderness.  Obese  Genitourinary: Rectum normal and penis normal.  Genitourinary Comments: Rectal normal tone soft brown stool no gross blood  Musculoskeletal: Normal range of motion. He exhibits no edema or tenderness.  Neurological: He is alert. Coordination normal.  Skin: Skin is warm and dry. No rash noted.  BiLateral lower extremities in una boots. Una boots were removed to reveal brawny skin changes below the knees bilaterally. Left lower extremity with 2 cm clean-appearing ulcer on shin and 5 cm clean-appearing ulcer on the anterior aspect  of the ankle. DP pulses unobtainable bilaterally. Both feet are warm  Psychiatric: He has a normal mood and affect.  Nursing  note and vitals reviewed.    ED Treatments / Results  Labs (all labs ordered are listed, but only abnormal results are displayed) Labs Reviewed  CBC  BASIC METABOLIC PANEL  URINALYSIS, ROUTINE W REFLEX MICROSCOPIC (NOT AT [facility])  POC OCCULT BLOOD, ED  Randolm Idol, ED   Nursing unable to establish peripheral IV access Angiocath insertion Performed by: Orlie Dakin  Consent: Verbal consent obtained. Risks and benefits: risks, benefits and alternatives were discussed Time out: Immediately prior to procedure a "time out" was called to verify the correct patient, procedure, equipment, support staff and site/side marked as required.  Preparation: Patient was prepped and draped in the usual sterile fashion.  Vein Location: right external jugular vein  Gauge: 20  Normal blood return and flush without difficulty Patient tolerance: Patient tolerated the procedure well with no immediate complications.   Results for orders placed or performed during the hospital encounter of 10/31/15  Blood Culture (routine x 2)  Result Value Ref Range   Specimen Description BLOOD RIGHT HAND    Special Requests BOTTLES DRAWN AEROBIC ONLY 5ML    Culture PENDING    Report Status PENDING   CBC  Result Value Ref Range   WBC 22.3 (H) 4.0 - 10.5 K/uL   RBC 6.04 (H) 4.22 - 5.81 MIL/uL   Hemoglobin 13.0 13.0 - 17.0 g/dL   HCT 43.7 39.0 - 52.0 %   MCV 72.4 (L) 78.0 - 100.0 fL   MCH 21.5 (L) 26.0 - 34.0 pg   MCHC 29.7 (L) 30.0 - 36.0 g/dL   RDW 22.8 (H) 11.5 - 15.5 %   Platelets 304 150 - 400 K/uL  Basic metabolic panel  Result Value Ref Range   Sodium 131 (L) 135 - 145 mmol/L   Potassium 4.4 3.5 - 5.1 mmol/L   Chloride 90 (L) 101 - 111 mmol/L   CO2 30 22 - 32 mmol/L   Glucose, Bld 141 (H) 65 - 99 mg/dL   BUN 31 (H) 6 - 20 mg/dL   Creatinine, Ser 1.74 (H) 0.61 - 1.24  mg/dL   Calcium 9.2 8.9 - 10.3 mg/dL   GFR calc non Af Amer 36 (L) >60 mL/min   GFR calc Af Amer 42 (L) >60 mL/min   Anion gap 11 5 - 15  Urinalysis, Routine w reflex microscopic (not at [facility])  Result Value Ref Range   Color, Urine YELLOW YELLOW   APPearance CLOUDY (A) CLEAR   Specific Gravity, Urine 1.012 1.005 - 1.030   pH 8.5 (H) 5.0 - 8.0   Glucose, UA NEGATIVE NEGATIVE mg/dL   Hgb urine dipstick NEGATIVE NEGATIVE   Bilirubin Urine NEGATIVE NEGATIVE   Ketones, ur NEGATIVE NEGATIVE mg/dL   Protein, ur NEGATIVE NEGATIVE mg/dL   Nitrite NEGATIVE NEGATIVE   Leukocytes, UA SMALL (A) NEGATIVE  Urine microscopic-add on  Result Value Ref Range   Squamous Epithelial / LPF 0-5 (A) NONE SEEN   WBC, UA 0-5 0 - 5 WBC/hpf   RBC / HPF 0-5 0 - 5 RBC/hpf   Bacteria, UA MANY (A) NONE SEEN  POC occult blood, ED  Result Value Ref Range   Fecal Occult Bld NEGATIVE NEGATIVE  I-stat troponin, ED  Result Value Ref Range   Troponin i, poc 0.01 0.00 - 0.08 ng/mL   Comment 3          I-Stat CG4 Lactic Acid, ED  Result Value Ref Range   Lactic Acid, Venous 3.16 (HH) 0.5 - 1.9 mmol/L  Comment NOTIFIED PHYSICIAN   I-Stat CG4 Lactic Acid, ED  Result Value Ref Range   Lactic Acid, Venous 3.61 (HH) 0.5 - 1.9 mmol/L   Comment NOTIFIED PHYSICIAN    *Note: Due to a large number of results and/or encounters for the requested time period, some results have not been displayed. A complete set of results can be found in Results Review.   Dg Chest 2 View  Result Date: 10/19/2015 CLINICAL DATA:  Productive cough. EXAM: CHEST  2 VIEW COMPARISON:  08/24/2014 FINDINGS: Postsurgical changes from CABG is stable. Cardiomediastinal silhouette is mildly enlarged. Mediastinal contours appear intact. There is no evidence of focal airspace consolidation, pleural effusion or pneumothorax. Mild pulmonary vascular congestion. Osseous structures are without acute abnormality. Soft tissues are grossly normal. IMPRESSION:  Mildly enlarged cardiac silhouette. Mild pulmonary vascular congestion. Electronically Signed   By: Fidela Salisbury M.D.   On: 10/19/2015 17:29  Ct Head Wo Contrast  Result Date: 10/31/2015 CLINICAL DATA:  On blood thinner.  Headache. EXAM: CT HEAD WITHOUT CONTRAST TECHNIQUE: Contiguous axial images were obtained from the base of the skull through the vertex without intravenous contrast. COMPARISON:  08/06/2014 FINDINGS: Brain: No evidence of acute infarction, hemorrhage, hydrocephalus, or mass lesion/mass effect. Extensive remote left MCA territory infarction, upper division, extending into the posterior border zone. Remote small vessel infarcts in the bilateral cerebellum. Vascular: Atherosclerotic calcifications Skull: Negative for fracture or focal lesion. Sinuses/Orbits: Bilateral cataract resection. Other: None. IMPRESSION: 1. No acute finding or change compared to prior. 2. Remote left MCA territory infarction. Remote small bilateral cerebellar infarcts. Electronically Signed   By: Monte Fantasia M.D.   On: 10/31/2015 17:11   Ct Angio Chest Pe W And/or Wo Contrast  Result Date: 10/19/2015 CLINICAL DATA:  Shortness of Breath with productive cough EXAM: CT ANGIOGRAPHY CHEST WITH CONTRAST TECHNIQUE: Multidetector CT imaging of the chest was performed using the standard protocol during bolus administration of intravenous contrast. Multiplanar CT image reconstructions and MIPs were obtained to evaluate the vascular anatomy. CONTRAST:  80 mL Isovue 370. COMPARISON:  None. FINDINGS: Mediastinum/Lymph Nodes: The thoracic inlet is within normal limits. No significant hilar or mediastinal adenopathy is noted. Cardiovascular: Pulmonary artery demonstrates a normal branching pattern. No findings to suggest pulmonary emboli are seen. Aortic calcifications are noted without aneurysmal dilatation or dissection. Changes consistent with coronary bypass grafting are noted. Multiple coronary stents are noted within  the bypass grafts. Native coronary calcifications are seen. No right heart strain is noted. No pericardial effusion is seen. Lungs/Pleura: Bilateral pleural effusions right greater than left are seen. Bilateral dependent atelectatic changes are noted. No significant pulmonary edema is noted. Upper abdomen: Visualized upper abdomen is within normal limits. Musculoskeletal: Degenerative changes of the thoracic spine are noted. Review of the MIP images confirms the above findings. IMPRESSION: No evidence of pulmonary emboli. Findings of prior coronary bypass grafting with stenting. No acute abnormality noted. Electronically Signed   By: Inez Catalina M.D.   On: 10/19/2015 21:41  Dg Chest Port 1 View  Result Date: 10/31/2015 CLINICAL DATA:  Hypotension EXAM: PORTABLE CHEST 1 VIEW COMPARISON:  10/19/2015 FINDINGS: Cardiac shadow is enlarged. Postsurgical changes are again seen. The lungs are well aerated bilaterally. Mild interstitial changes are again seen without acute abnormality. IMPRESSION: No acute abnormality noted. Electronically Signed   By: Inez Catalina M.D.   On: 10/31/2015 18:01   EKG  EKG Interpretation  Date/Time:  Wednesday October 31 2015 15:37:52 EDT Ventricular Rate:  91 PR Interval:  QRS Duration: 158 QT Interval:  403 QTC Calculation: 496 R Axis:   -2 Text Interpretation:  Sinus rhythm Left bundle branch block No significant change since last tracing Confirmed by Winfred Leeds  MD, Deontray Hunnicutt [phone]) on 10/31/2015 4:27:10 PM       Radiology No results found.  Procedures Procedures (including critical care time)  Medications Ordered in ED Medications  lidocaine (PF) (XYLOCAINE) 1 % injection (not administered)  sodium chloride 0.9 % bolus 500 mL (500 mLs Intravenous New Bag/Given 10/31/15 1603)  Code sepsis called based on [place]s criteria of elevated lactate and leukocytosis Chest x-ray viewed by me  720 pmRepeat sepsis assessment completed.  7:20 PM patient remains hypotensive after  2 L of normal saline and intravenous antibiotics infused. Critical care consulted for admission Results for orders placed or performed during the hospital encounter of 10/31/15  Blood Culture (routine x 2)  Result Value Ref Range   Specimen Description BLOOD RIGHT HAND    Special Requests BOTTLES DRAWN AEROBIC ONLY 5ML    Culture PENDING    Report Status PENDING   CBC  Result Value Ref Range   WBC 22.3 (H) 4.0 - 10.5 K/uL   RBC 6.04 (H) 4.22 - 5.81 MIL/uL   Hemoglobin 13.0 13.0 - 17.0 g/dL   HCT 43.7 39.0 - 52.0 %   MCV 72.4 (L) 78.0 - 100.0 fL   MCH 21.5 (L) 26.0 - 34.0 pg   MCHC 29.7 (L) 30.0 - 36.0 g/dL   RDW 22.8 (H) 11.5 - 15.5 %   Platelets 304 150 - 400 K/uL  Basic metabolic panel  Result Value Ref Range   Sodium 131 (L) 135 - 145 mmol/L   Potassium 4.4 3.5 - 5.1 mmol/L   Chloride 90 (L) 101 - 111 mmol/L   CO2 30 22 - 32 mmol/L   Glucose, Bld 141 (H) 65 - 99 mg/dL   BUN 31 (H) 6 - 20 mg/dL   Creatinine, Ser 1.74 (H) 0.61 - 1.24 mg/dL   Calcium 9.2 8.9 - 10.3 mg/dL   GFR calc non Af Amer 36 (L) >60 mL/min   GFR calc Af Amer 42 (L) >60 mL/min   Anion gap 11 5 - 15  Urinalysis, Routine w reflex microscopic (not at [facility])  Result Value Ref Range   Color, Urine YELLOW YELLOW   APPearance CLOUDY (A) CLEAR   Specific Gravity, Urine 1.012 1.005 - 1.030   pH 8.5 (H) 5.0 - 8.0   Glucose, UA NEGATIVE NEGATIVE mg/dL   Hgb urine dipstick NEGATIVE NEGATIVE   Bilirubin Urine NEGATIVE NEGATIVE   Ketones, ur NEGATIVE NEGATIVE mg/dL   Protein, ur NEGATIVE NEGATIVE mg/dL   Nitrite NEGATIVE NEGATIVE   Leukocytes, UA SMALL (A) NEGATIVE  Urine microscopic-add on  Result Value Ref Range   Squamous Epithelial / LPF 0-5 (A) NONE SEEN   WBC, UA 0-5 0 - 5 WBC/hpf   RBC / HPF 0-5 0 - 5 RBC/hpf   Bacteria, UA MANY (A) NONE SEEN  POC occult blood, ED  Result Value Ref Range   Fecal Occult Bld NEGATIVE NEGATIVE  I-stat troponin, ED  Result Value Ref Range   Troponin i, poc 0.01 0.00 -  0.08 ng/mL   Comment 3          I-Stat CG4 Lactic Acid, ED  Result Value Ref Range   Lactic Acid, Venous 3.16 (HH) 0.5 - 1.9 mmol/L   Comment NOTIFIED PHYSICIAN   I-Stat CG4 Lactic Acid, ED  Result  Value Ref Range   Lactic Acid, Venous 3.61 (HH) 0.5 - 1.9 mmol/L   Comment NOTIFIED PHYSICIAN    *Note: Due to a large number of results and/or encounters for the requested time period, some results have not been displayed. A complete set of results can be found in Results Review.   Dg Chest 2 View  Result Date: 10/19/2015 CLINICAL DATA:  Productive cough. EXAM: CHEST  2 VIEW COMPARISON:  08/24/2014 FINDINGS: Postsurgical changes from CABG is stable. Cardiomediastinal silhouette is mildly enlarged. Mediastinal contours appear intact. There is no evidence of focal airspace consolidation, pleural effusion or pneumothorax. Mild pulmonary vascular congestion. Osseous structures are without acute abnormality. Soft tissues are grossly normal. IMPRESSION: Mildly enlarged cardiac silhouette. Mild pulmonary vascular congestion. Electronically Signed   By: Fidela Salisbury M.D.   On: 10/19/2015 17:29  Ct Head Wo Contrast  Result Date: 10/31/2015 CLINICAL DATA:  On blood thinner.  Headache. EXAM: CT HEAD WITHOUT CONTRAST TECHNIQUE: Contiguous axial images were obtained from the base of the skull through the vertex without intravenous contrast. COMPARISON:  08/06/2014 FINDINGS: Brain: No evidence of acute infarction, hemorrhage, hydrocephalus, or mass lesion/mass effect. Extensive remote left MCA territory infarction, upper division, extending into the posterior border zone. Remote small vessel infarcts in the bilateral cerebellum. Vascular: Atherosclerotic calcifications Skull: Negative for fracture or focal lesion. Sinuses/Orbits: Bilateral cataract resection. Other: None. IMPRESSION: 1. No acute finding or change compared to prior. 2. Remote left MCA territory infarction. Remote small bilateral cerebellar  infarcts. Electronically Signed   By: Monte Fantasia M.D.   On: 10/31/2015 17:11   Ct Angio Chest Pe W And/or Wo Contrast  Result Date: 10/19/2015 CLINICAL DATA:  Shortness of Breath with productive cough EXAM: CT ANGIOGRAPHY CHEST WITH CONTRAST TECHNIQUE: Multidetector CT imaging of the chest was performed using the standard protocol during bolus administration of intravenous contrast. Multiplanar CT image reconstructions and MIPs were obtained to evaluate the vascular anatomy. CONTRAST:  80 mL Isovue 370. COMPARISON:  None. FINDINGS: Mediastinum/Lymph Nodes: The thoracic inlet is within normal limits. No significant hilar or mediastinal adenopathy is noted. Cardiovascular: Pulmonary artery demonstrates a normal branching pattern. No findings to suggest pulmonary emboli are seen. Aortic calcifications are noted without aneurysmal dilatation or dissection. Changes consistent with coronary bypass grafting are noted. Multiple coronary stents are noted within the bypass grafts. Native coronary calcifications are seen. No right heart strain is noted. No pericardial effusion is seen. Lungs/Pleura: Bilateral pleural effusions right greater than left are seen. Bilateral dependent atelectatic changes are noted. No significant pulmonary edema is noted. Upper abdomen: Visualized upper abdomen is within normal limits. Musculoskeletal: Degenerative changes of the thoracic spine are noted. Review of the MIP images confirms the above findings. IMPRESSION: No evidence of pulmonary emboli. Findings of prior coronary bypass grafting with stenting. No acute abnormality noted. Electronically Signed   By: Inez Catalina M.D.   On: 10/19/2015 21:41  Dg Chest Port 1 View  Result Date: 10/31/2015 CLINICAL DATA:  Hypotension EXAM: PORTABLE CHEST 1 VIEW COMPARISON:  10/19/2015 FINDINGS: Cardiac shadow is enlarged. Postsurgical changes are again seen. The lungs are well aerated bilaterally. Mild interstitial changes are again seen  without acute abnormality. IMPRESSION: No acute abnormality noted. Electronically Signed   By: Inez Catalina M.D.   On: 10/31/2015 18:01   Initial Impression / Assessment and Plan / ED Course  I have reviewed the triage vital signs and the nursing notes.  Pertinent labs & imaging results that were available during  my care of the patient were reviewed by me and considered in my medical decision making (see chart for details).  Clinical Course  .Dr. Lake Bells from critical care service evaluated patient in the ED and the first to hospitalist service. Dr Alcario Drought , hospitalist physicuian was consulted and evaluated patient in ED.  He will be to admit to stepdown unit. Hypotension likely secondary to volume depletion and overdiuresis however patient may be septic. Osteomyelitis of lower extremity source of infection. Plan IV fluids, IV antibiotics Final Clinical Impressions(s) / ED Diagnoses   Diagnoses #1 septic shock  #2 hyperglycemia  #3 acute kidney injury  #4 syncope  Final diagnoses:  None   CRITICAL CARE Performed by: Orlie Dakin Total critical care time: 60 minutes Critical care time was exclusive of separately billable procedures and treating other patients. Critical care was necessary to treat or prevent imminent or life-threatening deterioration. Critical care was time spent personally by me on the following activities: development of treatment plan with patient and/or surrogate as well as nursing, discussions with consultants, evaluation of patient's response to treatment, examination of patient, obtaining history from patient or surrogate, ordering and performing treatments and interventions, ordering and review of laboratory studies, ordering and review of radiographic studies, pulse oximetry and re-evaluation of patient's condition. New Prescriptions New Prescriptions   No medications on file     Orlie Dakin, MD 10/31/15 2155

## 2015-10-31 NOTE — Progress Notes (Signed)
Pharmacy Antibiotic Note  [name] is a 75 y.o. male admitted on 10/31/2015 with r/o osteo.  Pharmacy has been consulted for vancomycin and zosyn dosing. Pt is afebrile and WBC is elevated at 22.3. Scr is elevated above baseline at 1.74. Lactic acid is also elevated at 3.61.   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  Estimated Creatinine Clearance: 45 mL/min (by C-G formula based on SCr of 1.74 mg/dL).    Allergies  Allergen Reactions  . Diazepam Anxiety    REACTION: makes patient cry  . Vancomycin Other (See Comments)    "red man syndrome"    Antimicrobials this admission: Vanc 8/9>> Zosyn 8/9>> Linezolid x 1 8/9  Dose adjustments this admission: N/A  Microbiology results: Pending  Thank you for allowing pharmacy to be a part of this patient's care.  Jye Fariss, Rande Lawman 10/31/2015 10:01 PM

## 2015-10-31 NOTE — H&P (Signed)
History and Physical    [name] [id number] DOB: Jul 24, 1940 DOA: 10/31/2015   PCP: [name], MD Chief Complaint:  Chief Complaint  Patient presents with  . Loss of Consciousness    HPI: [name] is a 75 y.o. male with medical history significant of morbid obesity, DM, systolic CHF, COPD, MRSA bacteremia in 2011.  He was just discharged from our hospital 2 days ago after being treated for a COPD exacerbation and congestive heart failure exacerbation. During that time he was treated with aggressive diuresis and his diuretic medicines were adjusted on discharge. He states that on the day of admission he started feeling somewhat weak and had headache and some neck pain. He denied nausea, vomiting, diarrhea or belly pain. He then had an episode of passing out while sitting in a chair in his nursing home so he was sent to [facility] for further evaluation. Here he was noted to have leukocytosis, hypotension, and acute kidney injury. He's got chronic wounds on his legs which he says have not changed recently. He also has sacral decubitus ulcers. His wife says he has not walked in over a year. He confirms to me that he does not want aggressive resuscitation with life support or CPR.  ED Course: In the ED patient was hypotensive, given IVF resuscitation for sepsis, PCCM came to see him, initially had good BP response with IVF.  But then his EJ access infiltrated and his BP has now dropped to upper [id number] systolic.  They have now gotten another access site to allow [place] to resume IVF.  Also got zosyn and one dose of LZD for sepsis which is possibly related to his ulcers which PCCM suspects is source (especially a left leg ulcer that may have osteomyelitis associated with it).  Review of Systems: As per HPI otherwise 10 point review of systems negative.    Past Medical History:  Diagnosis Date  . Adhesive capsulitis   . Angina   . Anxiety   . CAD (coronary artery disease)   . CHF  (congestive heart failure) ([place])   . Chronic back pain   . Chronic kidney disease    hx of BPH  . COPD (chronic obstructive pulmonary disease) ([place])   . CVA (cerebral infarction) Questionable history  . Depression   . Diabetes mellitus   . Diverticulosis   . Falls frequently 06/2014  . GERD (gastroesophageal reflux disease)   . Hyperlipidemia   . Hypertension   . 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 Myocardial infarction ([place])   . Neuromuscular disorder ([place])    HX of diabetic periferal neuropathy  . On home oxygen therapy    "1.5L prn" (04/12/2014)  . Osteomyelitis ([place]) 2012   Sternoclavicular joint   . PE (pulmonary embolism) 10-15 years ago   Lifelong Coumadin  . Peripheral vascular disease ([place])   . Shortness of breath   . Sleep apnea     Past Surgical History:  Procedure Laterality Date  . AMPUTATION  03/16/2011   Procedure: AMPUTATION DIGIT;  Surgeon: Angelia Mould, MD;  Location: [facility] OR;  Service: Vascular;  Laterality: Left;  Great toe  . CARDIAC CATHETERIZATION  October 18, 2010   Known obstructive disease, no further blockages  . CARDIAC CATHETERIZATION  03/17/2005   EF 35-40%  . COLONOSCOPY  2006    Multiple polyps removed, repeat in 5 years  . CORONARY ARTERY BYPASS GRAFT  1992  .  [place], 2002  . DOPPLER ECHOCARDIOGRAPHY  Sept 2011   EF 50-55% with some impaired diastolic relaxation  . FEMORAL-POPLITEAL BYPASS GRAFT  2008   Left  . FRACTURE SURGERY  1980s   Hip  . HIP ARTHROPLASTY Left   . Right iliopopliteal bypass - [place]  . septic arthritis     Removal of infected CABG wire by Dr Arlyce Dice - 2011  . SPINE SURGERY  2002   Cervical fusion vertebroplasty C3-4-5  . US ECHOCARDIOGRAPHY  03/04/2006   EF 50-55%     reports that he quit smoking about 15 years ago. He has never used smokeless tobacco. He reports that he does not drink alcohol or use drugs.  Allergies  Allergen  Reactions  . Diazepam Anxiety    REACTION: makes patient cry  . Vancomycin Other (See Comments)    "red man syndrome"    Family History  Problem Relation Age of Onset  . Heart disease Father   . Heart disease Mother       Prior to Admission medications   Medication Sig Start Date End Date Taking? Authorizing Provider  carvedilol (COREG) 3.125 MG tablet Take 1 tablet (3.125 mg total) by mouth 2 (two) times daily with a meal. 10/29/15  Yes Orson Eva, MD  docusate sodium (COLACE) 100 MG capsule Take 100 mg by mouth 2 (two) times daily.   Yes Historical Provider, MD  furosemide (LASIX) 80 MG tablet Take 1 tablet (80 mg total) by mouth 2 (two) times daily. 10/29/15  Yes Orson Eva, MD  gabapentin (NEURONTIN) 400 MG capsule Take 400 mg by mouth 3 (three) times daily.   Yes Historical Provider, MD  HYDROcodone-acetaminophen (NORCO) 7.5-325 MG tablet Take 1 tablet by mouth every 6 (six) hours as needed for moderate pain. 10/29/15  Yes Orson Eva, MD  insulin NPH Human (HUMULIN N,NOVOLIN N) 100 UNIT/ML injection Inject 0.24 mLs (24 Units total) into the skin 2 (two) times daily before a meal. 10/29/15  Yes Orson Eva, MD  OXYGEN Inhale 2 L into the lungs as needed (for congestion, cough, and wheezing related to COPD).   Yes Historical Provider, MD  Propylene Glycol (SYSTANE BALANCE) 0.6 % SOLN Place 1 drop into both eyes 2 (two) times daily.    Yes Historical Provider, MD  PROTEIN PO Take 60 mLs by mouth 3 (three) times daily.    Yes Historical Provider, MD  DULoxetine (CYMBALTA) 30 MG capsule Take 90 mg by mouth daily.    Historical Provider, MD  feeding supplement, GLUCERNA SHAKE, (GLUCERNA SHAKE) LIQD Take 237 mLs by mouth daily. 10/29/15   Orson Eva, MD  ferrous sulfate 325 (65 FE) MG tablet Take 1 tablet (325 mg total) by mouth daily with breakfast. 10/29/15   Orson Eva, MD  HUMULIN N KWIKPEN 100 UNIT/ML Kiwkpen 24 Units. 10/29/15   Historical Provider, MD  insulin lispro (HUMALOG KWIKPEN) 100 UNIT/ML  KiwkPen Inject 8 Units into the skin 3 (three) times daily before meals. An additional if CBG is 0-149=0 units, 150-400= 5 units before meals and at bedtime    Historical Provider, MD  lidocaine (LIDODERM) 5 % Place 1 patch onto the skin daily.     Historical Provider, MD  lisinopril (PRINIVIL,ZESTRIL) 2.5 MG tablet Take 2.5 mg by mouth daily.  06/06/14   Historical Provider, MD  LORazepam (ATIVAN) 0.5 MG tablet Take 0.5 tablets (0.25 mg total) by mouth daily as needed for anxiety. 10/29/15   Orson Eva,  MD  lubiprostone (AMITIZA) 8 MCG capsule Take 8 mcg by mouth 2 (two) times daily with a meal.    Historical Provider, MD  Multiple Vitamins-Minerals (DECUBI-VITE PO) Take 1 tablet by mouth daily.     Historical Provider, MD  nitroGLYCERIN (NITROSTAT) 0.4 MG SL tablet Place 1 tablet (0.4 mg total) under the tongue as needed. For chest pains 10/19/13   Alveda Reasons, MD  omeprazole (PRILOSEC) 40 MG capsule Take 1 capsule (40 mg total) by mouth daily. 05/10/14   Alveda Reasons, MD  rivaroxaban (XARELTO) 20 MG TABS tablet Take 20 mg by mouth daily with supper.    Historical Provider, MD  simvastatin (ZOCOR) 80 MG tablet Take 1 tablet (80 mg total) by mouth daily. 06/06/14   Alveda Reasons, MD  spironolactone (ALDACTONE) 25 MG tablet Take 1 tablet (25 mg total) by mouth daily. 10/29/15   Orson Eva, MD  tamsulosin (FLOMAX) 0.4 MG CAPS capsule Take 1 capsule (0.4 mg total) by mouth daily after supper. 10/19/13   Alveda Reasons, MD  traZODone (DESYREL) 100 MG tablet Take 100 mg by mouth at bedtime.     Historical Provider, MD  Vitamin D, Ergocalciferol, (DRISDOL) 50000 units CAPS capsule Take 1 capsule (50,000 Units total) by mouth every 7 (seven) days. 05/03/15   Gerlene Fee, NP    Physical Exam: Vitals:   10/31/15 1945 10/31/15 2000 10/31/15 2030 10/31/15 2100  BP: (!) 100/50 98/63 97/58  (!) 74/60  Pulse: 90 94 91 92  Resp: 15 13 16 12   Temp:      TempSrc:      SpO2: 98% 99% 97% 98%  Weight:        Height:          Constitutional: NAD, calm, comfortable, morbidly obese Eyes: PERRL, lids and conjunctivae normal ENMT: Mucous membranes are moist. Posterior pharynx clear of any exudate or lesions.Normal dentition.  Neck: normal, supple, no masses, no thyromegaly, REJ has infiltrated and now R neck is very edematous and patient says it is tender. Respiratory: clear to auscultation bilaterally, no wheezing, no crackles. Normal respiratory effort. No accessory muscle use.  Cardiovascular: Regular rate and rhythm, no murmurs / rubs / gallops. Chronic BLE edema. 2+ pedal pulses. No carotid bruits.  Abdomen: no tenderness, no masses palpated. No hepatosplenomegaly. Bowel sounds positive.  Musculoskeletal: no clubbing / cyanosis. No joint deformity upper and lower extremities. Good ROM, no contractures. Normal muscle tone.  Skin: BLE ulcers on feet, heel on R foot, and dorsal surface of left foot, left foot ulcer has drainage.  Also has sacral decubitus. Neurologic: CN 2-12 grossly intact. Sensation intact, DTR normal. Strength 5/5 in all 4.  Psychiatric: Normal judgment and insight. Alert and oriented x 3. Normal mood.    Labs on Admission: I have personally reviewed following labs and imaging studies  CBC:  Recent Labs Lab 10/25/15 0418 10/29/15 0328 10/31/15 1608  WBC 14.5* 17.1* 22.3*  HGB 9.6* 11.0* 13.0  HCT 34.6* 38.3* 43.7  MCV 70.9* 71.7* 72.4*  PLT 343 310 [id number]   Basic Metabolic Panel:  Recent Labs Lab 10/27/15 0338 10/28/15 0407 10/28/15 1037 10/29/15 0328 10/31/15 1608  NA 135 134* 133* 132* 131*  K 3.2* 5.8* 4.3 3.3* 4.4  CL 88* 91* 90* 91* 90*  CO2 36* 29 35* 34* 30  GLUCOSE 287* 158* 269* 183* 141*  BUN 31* 33* 29* 26* 31*  CREATININE 0.98 0.90 0.88 0.88 1.74*  CALCIUM 8.8* 8.4* 8.6*  8.5* 9.2  MG 2.3  --   --   --   --    GFR: Estimated Creatinine Clearance: 45 mL/min (by C-G formula based on SCr of 1.74 mg/dL). Liver Function Tests: No results for  input(s): AST, ALT, ALKPHOS, BILITOT, PROT, ALBUMIN in the last 168 hours. No results for input(s): LIPASE, AMYLASE in the last 168 hours. No results for input(s): AMMONIA in the last 168 hours. Coagulation Profile: No results for input(s): INR, PROTIME in the last 168 hours. Cardiac Enzymes: No results for input(s): CKTOTAL, CKMB, CKMBINDEX, TROPONINI in the last 168 hours. BNP (last 3 results) No results for input(s): PROBNP in the last 8760 hours. HbA1C: No results for input(s): HGBA1C in the last 72 hours. CBG:  Recent Labs Lab 10/28/15 1135 10/28/15 1636 10/28/15 2136 10/29/15 0623 10/29/15 1140  GLUCAP 271* 173* 153* 161* 268*   Lipid Profile:  Recent Labs  10/29/15 0328  CHOL 107  HDL 34*  LDLCALC 48  TRIG 124  CHOLHDL 3.1   Thyroid Function Tests: No results for input(s): TSH, T4TOTAL, FREET4, T3FREE, THYROIDAB in the last 72 hours. Anemia Panel: No results for input(s): VITAMINB12, FOLATE, FERRITIN, TIBC, IRON, RETICCTPCT in the last 72 hours. Urine analysis:    Component Value Date/Time   COLORURINE YELLOW 10/31/2015 1608   APPEARANCEUR CLOUDY (A) 10/31/2015 1608   LABSPEC 1.012 10/31/2015 1608   PHURINE 8.5 (H) 10/31/2015 1608   GLUCOSEU NEGATIVE 10/31/2015 1608   HGBUR NEGATIVE 10/31/2015 1608   HGBUR negative 03/06/2010 1419   BILIRUBINUR NEGATIVE 10/31/2015 1608   KETONESUR NEGATIVE 10/31/2015 1608   PROTEINUR NEGATIVE 10/31/2015 1608   UROBILINOGEN 0.2 09/13/2014 0945   NITRITE NEGATIVE 10/31/2015 1608   LEUKOCYTESUR SMALL (A) 10/31/2015 1608   Sepsis Labs: @LABRCNTIP (procalcitonin:4,lacticidven:4) ) Recent Results (from the past 240 hour(s))  Blood Culture (routine x 2)     Status: None (Preliminary result)   Collection Time: 10/31/15  6:30 PM  Result Value Ref Range Status   Specimen Description BLOOD RIGHT HAND  Final   Special Requests BOTTLES DRAWN AEROBIC ONLY 5ML  Final   Culture PENDING  Incomplete   Report Status PENDING   Incomplete     Radiological Exams on Admission: Ct Head Wo Contrast  Result Date: 10/31/2015 CLINICAL DATA:  On blood thinner.  Headache. EXAM: CT HEAD WITHOUT CONTRAST TECHNIQUE: Contiguous axial images were obtained from the base of the skull through the vertex without intravenous contrast. COMPARISON:  08/06/2014 FINDINGS: Brain: No evidence of acute infarction, hemorrhage, hydrocephalus, or mass lesion/mass effect. Extensive remote left MCA territory infarction, upper division, extending into the posterior border zone. Remote small vessel infarcts in the bilateral cerebellum. Vascular: Atherosclerotic calcifications Skull: Negative for fracture or focal lesion. Sinuses/Orbits: Bilateral cataract resection. Other: None. IMPRESSION: 1. No acute finding or change compared to prior. 2. Remote left MCA territory infarction. Remote small bilateral cerebellar infarcts. Electronically Signed   By: Monte Fantasia M.D.   On: 10/31/2015 17:11   Dg Chest Port 1 View  Result Date: 10/31/2015 CLINICAL DATA:  Hypotension EXAM: PORTABLE CHEST 1 VIEW COMPARISON:  10/19/2015 FINDINGS: Cardiac shadow is enlarged. Postsurgical changes are again seen. The lungs are well aerated bilaterally. Mild interstitial changes are again seen without acute abnormality. IMPRESSION: No acute abnormality noted. Electronically Signed   By: Inez Catalina M.D.   On: 10/31/2015 18:01    EKG: Independently reviewed.  Assessment/Plan Principal Problem:   Septic shock (HCC) Active Problems:   Chronic systolic heart failure (  [place])   Long term current use of anticoagulant therapy   Type II diabetes mellitus with neurological manifestations ([place])   AKI (acute kidney injury) ([place])   Diabetic ulcer of left foot ([place])   Diabetic ulcer of right foot ([place])   Septic shock - currently the suspicion is that his source is secondary to osteomyelitis of LLE.  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  Given recurrent drop in BPs, will continue IVF resuscitation at this point.  Holding all BP meds and diuretics  Wound care consult ordered  If clinically improved, may wish to get ortho consult in AM, especially if X ray confirms osteo.  If no osteo on X ray then may want to do MRI vs bone scan to look for osteo of left foot.  Chronic systolic CHF -  Watch for signs of fluid overload with resuscitation.  Currently sating 100% on RA laying flat on his back, so not fluid overloaded clinically currently.  AKI - secondary to septic shock above  Repeat BMP in AM  DM2 -  Reduce NPH to 18 BID from 24 BID  SSI sensitive scale AC/HS  3 units mealtime  Long term anticoagulation -  Will hold Xarelto and use heparin gtt instead    DVT prophylaxis: Heparin gtt Code Status: DNR/DNI Family Communication: Wife at bedside Consults called: PCCM has seen patient in ED Admission status: Admit to inpatient   Etta Quill DO [facility] Pager [phone] from 7PM-7AM  If 7AM-7PM, please contact the day physician for the patient [email or web address] Password [email or web address]  10/31/2015, 9:45 PM

## 2015-11-01 ENCOUNTER — Inpatient Hospital Stay (HOSPITAL_COMMUNITY): Payer: Medicare Other

## 2015-11-01 ENCOUNTER — Encounter (HOSPITAL_COMMUNITY): Payer: Self-pay | Admitting: Radiology

## 2015-11-01 DIAGNOSIS — M869 Osteomyelitis, unspecified: Secondary | ICD-10-CM

## 2015-11-01 DIAGNOSIS — J438 Other emphysema: Secondary | ICD-10-CM

## 2015-11-01 DIAGNOSIS — E1169 Type 2 diabetes mellitus with other specified complication: Secondary | ICD-10-CM

## 2015-11-01 DIAGNOSIS — F411 Generalized anxiety disorder: Secondary | ICD-10-CM

## 2015-11-01 DIAGNOSIS — E1165 Type 2 diabetes mellitus with hyperglycemia: Secondary | ICD-10-CM

## 2015-11-01 DIAGNOSIS — N179 Acute kidney failure, unspecified: Secondary | ICD-10-CM | POA: Diagnosis present

## 2015-11-01 DIAGNOSIS — E118 Type 2 diabetes mellitus with unspecified complications: Secondary | ICD-10-CM

## 2015-11-01 DIAGNOSIS — IMO0002 Reserved for concepts with insufficient information to code with codable children: Secondary | ICD-10-CM | POA: Diagnosis present

## 2015-11-01 DIAGNOSIS — I96 Gangrene, not elsewhere classified: Secondary | ICD-10-CM | POA: Diagnosis present

## 2015-11-01 DIAGNOSIS — F329 Major depressive disorder, single episode, unspecified: Secondary | ICD-10-CM

## 2015-11-01 LAB — HEPATIC FUNCTION PANEL
ALBUMIN: 2.9 g/dL — AB (ref 3.5–5.0)
ALK PHOS: 67 U/L (ref 38–126)
ALT: 18 U/L (ref 17–63)
ALT: 16 U/L — ABNORMAL LOW (ref 17–63)
AST: 18 U/L (ref 15–41)
AST: 17 U/L (ref 15–41)
Albumin: 3.3 g/dL — ABNORMAL LOW (ref 3.5–5.0)
Alkaline Phosphatase: 61 U/L (ref 38–126)
BILIRUBIN DIRECT: 0.3 mg/dL (ref 0.1–0.5)
BILIRUBIN DIRECT: 0.3 mg/dL (ref 0.1–0.5)
BILIRUBIN TOTAL: 1.2 mg/dL (ref 0.3–1.2)
Indirect Bilirubin: 0.9 mg/dL (ref 0.3–0.9)
Indirect Bilirubin: 0.7 mg/dL (ref 0.3–0.9)
Total Bilirubin: 1 mg/dL (ref 0.3–1.2)
Total Protein: 6.5 g/dL (ref 6.5–8.1)
Total Protein: 5.9 g/dL — ABNORMAL LOW (ref 6.5–8.1)

## 2015-11-01 LAB — GLUCOSE, CAPILLARY
GLUCOSE-CAPILLARY: 122 mg/dL — AB (ref 65–99)
GLUCOSE-CAPILLARY: 212 mg/dL — AB (ref 65–99)
GLUCOSE-CAPILLARY: 203 mg/dL — AB (ref 65–99)
Glucose-Capillary: 124 mg/dL — ABNORMAL HIGH (ref 65–99)

## 2015-11-01 LAB — BASIC METABOLIC PANEL
Anion gap: 11 (ref 5–15)
BUN: 35 mg/dL — ABNORMAL HIGH (ref 6–20)
CALCIUM: 8.2 mg/dL — AB (ref 8.9–10.3)
CHLORIDE: 94 mmol/L — AB (ref 101–111)
CO2: 28 mmol/L (ref 22–32)
CREATININE: 1.63 mg/dL — AB (ref 0.61–1.24)
GFR calc Af Amer: 46 mL/min — ABNORMAL LOW (ref >60)
GFR calc non Af Amer: 39 mL/min — ABNORMAL LOW (ref >60)
GLUCOSE: 125 mg/dL — AB (ref 65–99)
Potassium: 4 mmol/L (ref 3.5–5.1)
Sodium: 133 mmol/L — ABNORMAL LOW (ref 135–145)

## 2015-11-01 LAB — CBC
HEMATOCRIT: 39.4 % (ref 39.0–52.0)
HEMOGLOBIN: 11.4 g/dL — AB (ref 13.0–17.0)
MCH: 20.8 pg — ABNORMAL LOW (ref 26.0–34.0)
MCHC: 28.9 g/dL — AB (ref 30.0–36.0)
MCV: 72 fL — AB (ref 78.0–100.0)
Platelets: 223 10*3/uL (ref 150–400)
RBC: 5.47 MIL/uL (ref 4.22–5.81)
RDW: 23.1 % — AB (ref 11.5–15.5)
WBC: 16 10*3/uL — ABNORMAL HIGH (ref 4.0–10.5)

## 2015-11-01 LAB — APTT
aPTT: 40 seconds — ABNORMAL HIGH (ref 24–36)
aPTT: 45 seconds — ABNORMAL HIGH (ref 24–36)

## 2015-11-01 LAB — LIPID PANEL
CHOL/HDL RATIO: 3.2 ratio
CHOLESTEROL: 109 mg/dL (ref 0–200)
HDL: 34 mg/dL — ABNORMAL LOW (ref >40)
LDL Cholesterol: 58 mg/dL (ref 0–99)
Triglycerides: 84 mg/dL (ref <150)
VLDL: 17 mg/dL (ref 0–40)

## 2015-11-01 LAB — MAGNESIUM: Magnesium: 2.1 mg/dL (ref 1.7–2.4)

## 2015-11-01 LAB — HEPARIN LEVEL (UNFRACTIONATED): Heparin Unfractionated: 0.42 IU/mL (ref 0.30–0.70)

## 2015-11-01 LAB — LACTIC ACID, PLASMA
LACTIC ACID, VENOUS: 1.5 mmol/L (ref 0.5–1.9)
Lactic Acid, Venous: 2.2 mmol/L (ref 0.5–1.9)

## 2015-11-01 MED ORDER — CETYLPYRIDINIUM CHLORIDE 0.05 % MT LIQD
7.0000 mL | Freq: Two times a day (BID) | OROMUCOSAL | Status: DC
  Administered 2015-11-02 – 2015-11-10 (×8): 7 mL via OROMUCOSAL

## 2015-11-01 MED ORDER — TAMSULOSIN HCL 0.4 MG PO CAPS
0.4000 mg | ORAL_CAPSULE | Freq: Every day | ORAL | Status: DC
  Administered 2015-11-02 – 2015-11-09 (×8): 0.4 mg via ORAL
  Filled 2015-11-01 (×8): qty 1

## 2015-11-01 MED ORDER — DIPHENHYDRAMINE HCL 50 MG/ML IJ SOLN
25.0000 mg | Freq: Every day | INTRAMUSCULAR | Status: DC
  Administered 2015-11-02: 25 mg via INTRAVENOUS
  Filled 2015-11-01: qty 1

## 2015-11-01 MED ORDER — SODIUM CHLORIDE 0.9 % IV BOLUS (SEPSIS)
500.0000 mL | Freq: Once | INTRAVENOUS | Status: AC
  Administered 2015-11-01: 500 mL via INTRAVENOUS

## 2015-11-01 MED ORDER — COLLAGENASE 250 UNIT/GM EX OINT
TOPICAL_OINTMENT | Freq: Every day | CUTANEOUS | Status: DC
  Administered 2015-11-01: 1 via TOPICAL
  Administered 2015-11-02 – 2015-11-10 (×7): via TOPICAL
  Filled 2015-11-01 (×4): qty 30

## 2015-11-01 MED ORDER — HEPARIN (PORCINE) IN NACL 100-0.45 UNIT/ML-% IJ SOLN
1800.0000 [IU]/h | INTRAMUSCULAR | Status: DC
  Administered 2015-11-01 – 2015-11-02 (×2): 1500 [IU]/h via INTRAVENOUS
  Administered 2015-11-03: 1600 [IU]/h via INTRAVENOUS
  Administered 2015-11-03: 1500 [IU]/h via INTRAVENOUS
  Administered 2015-11-04 – 2015-11-06 (×4): 1800 [IU]/h via INTRAVENOUS
  Filled 2015-11-01 (×9): qty 250

## 2015-11-01 MED ORDER — CETYLPYRIDINIUM CHLORIDE 0.05 % MT LIQD
7.0000 mL | Freq: Two times a day (BID) | OROMUCOSAL | Status: DC
  Administered 2015-11-01 – 2015-11-10 (×12): 7 mL via OROMUCOSAL

## 2015-11-01 MED ORDER — INSULIN ASPART 100 UNIT/ML ~~LOC~~ SOLN
8.0000 [IU] | Freq: Three times a day (TID) | SUBCUTANEOUS | Status: DC
  Administered 2015-11-02 – 2015-11-09 (×23): 8 [IU] via SUBCUTANEOUS

## 2015-11-01 MED ORDER — ALBUMIN HUMAN 25 % IV SOLN
50.0000 g | Freq: Once | INTRAVENOUS | Status: AC
  Administered 2015-11-01: 50 g via INTRAVENOUS
  Filled 2015-11-01: qty 50

## 2015-11-01 NOTE — Consult Note (Addendum)
[place] Nurse wound consult note Pt is familiar to [place] team from recent visit , refer to previous progress notes on 8/7.  Reason for Consult: Left heel unstageable pressure injury has  continued to decline; 9X8cm, 90% soft fluctuant eschar, 10% yellow slough at the wound edges, some tan drainage, strong odor Left anterior foot with stage 3 pressure injury; unchanged since previous assessment, 5.5X4X.2cm, 90% red, 10% yellow interspersed throughout, no odor, mod amt yellow drainage Left calf with several new areas of wounds.  Left outer calf with dark red-black fluid filled blister; 1X1cm, no odor or drainage Left middle calf with previous blister which has ruptured, .5X.5X.1cm, dark reddish -purple, no odor, small amt yellow drainage.  Left inner calf with full thickness wound; 3X1.5X.1cm, 90% red, 10% yellow interspersed, small amt yellow drainage, no odor. Right heel fairly unchanged; 30% red stage 3 pressure injury; 70% yellow slough unstageable pressure injury; 1.2X2.5X.2cm, small amt tan drainage, no odor. Sacrum/buttocks with generalized redness and maceration; appearance consistent with moisture associated skin damage.  Red patchy areas of partial thickness skin loss, partial thickness fissure related to moisture in the gluteal cleft, 1X.1X.1cm, pink and moist.  This is NOT a pressure injury. Pressure Ulcer POA: Yes Dressing procedure/placement/frequency: Float heels to reduce pressure.   Foam dressing to upper left leg and sacrum to promote healing. Continue calcium alginate to left anterior foot and left inner calf to absorb drainage and promote healing. Continue Santyl ointment for enzymatic debridement of nonviable tissue to bilat heels.   Please consider ortho consult for left heel unstageable pressure injury; it is fluctuant and has a strong foul odor.Please re-consult if further assistance is needed.  Thank-you,  Julien Girt MSN, [place], [place], [place], [place]

## 2015-11-01 NOTE — Progress Notes (Signed)
[place] for heparin Indication: atrial fibrillation  Allergies  Allergen Reactions  . Diazepam Anxiety    REACTION: makes patient cry  . Vancomycin Other (See Comments)    "red man syndrome"    Patient Measurements: Height: 5\' 8"  (172.7 cm) Weight: 259 lb 6.4 oz (117.7 kg) IBW/kg (Calculated) : 68.4 Heparin Dosing Weight: 95kg  Vital Signs: Temp: 97.6 F (36.4 C) (08/10 1928) Temp Source: Oral (08/10 1928) BP: 101/51 (08/10 1928) Pulse Rate: 94 (08/10 1612)  Labs:  Recent Labs  10/31/15 1608 11/01/15 0046 11/01/15 0631 11/01/15 0825 11/01/15 1859  HGB 13.0  --  11.4*  --   --   HCT 43.7  --  39.4  --   --   PLT 304  --  223  --   --   APTT  --  40*  --  45*  --   HEPARINUNFRC  --   --   --   --  0.42  CREATININE 1.74*  --  1.63*  --   --      Medications:  Infusions:  . sodium chloride 75 mL/hr at 11/01/15 1857  . heparin 1,500 Units/hr (11/01/15 1700)    Assessment: 67 yom presented to the ED with syncope. He is on chronic xarelto for history of afib but switched to heparin. H/H with slight trend down and platelets are WNL. No bleeding noted.   PTT not drawn this evening; however, HL 0.42 which is within desired range of 0.3 - 0.7 after most rate increase. No bleeding reported.   Goal of Therapy:  Heparin level 0.3-0.7 units/ml Monitor platelets by anticoagulation protocol: Yes   Plan:  - Continue IV heparin at 1500 units/hr - HL in am to serve as confirmatory - Daily CBC and heparin level - Discontinue PTT's   Vincenza Hews, PharmD, BCPS 11/01/2015, 8:48 PM Pager: [phone]

## 2015-11-01 NOTE — Progress Notes (Signed)
[place] for heparin Indication: atrial fibrillation  Allergies  Allergen Reactions  . Diazepam Anxiety    REACTION: makes patient cry  . Vancomycin Other (See Comments)    "red man syndrome"    Patient Measurements: Height: 5\' 8"  ([id number] cm) Weight: 259 lb 6.4 oz (117.7 kg) IBW/kg (Calculated) : 68.4 Heparin Dosing Weight: 95kg  Vital Signs: Temp: 97.6 F (36.4 C) (08/10 1129) Temp Source: Oral (08/10 1129) BP: 102/49 (08/10 1129) Pulse Rate: 93 (08/10 1129)  Labs:  Recent Labs  10/31/15 1608 11/01/15 0046 11/01/15 0631 11/01/15 0825  HGB 13.0  --  11.4*  --   HCT 43.7  --  39.4  --   PLT 304  --  223  --   APTT  --  40*  --  45*  CREATININE 1.74*  --  1.63*  --      Medications:  Infusions:  . sodium chloride 125 mL/hr at 11/01/15 0800  . heparin 1,500 Units/hr (11/01/15 1045)    Assessment: 51 yom presented to the ED with syncope. He is on chronic xarelto for history of afib but switched to heparin. H/H with slight trend down and platelets are WNL. No bleeding noted.   PTT remains below goal this morning.  Heparin infusion running at intended rate, no issues with IV infusion.   Goal of Therapy:  Heparin level 0.3-0.7 units/ml aPTT 66-102 seconds Monitor platelets by anticoagulation protocol: Yes   Plan:  - Increase IV heparin to 1500 units/hr. - Check heparin level and PTT in 6 hrs - Daily aPTT, CBC and heparin level  Uvaldo Rising, BCPS  Clinical Pharmacist Pager [phone]  11/01/2015 11:45 AM

## 2015-11-01 NOTE — Progress Notes (Signed)
MD paged for soft BP's, bolus ordered as well as an order for foley. Pt having bladder pressure but unable to urinate. Bladder scan showed 500 ml. Foley inserted by 2 certified RN's. Will continue to monitor BP, intake and output.

## 2015-11-01 NOTE — Progress Notes (Signed)
PROGRESS NOTE    [name]  [id number] DOB: December 01, 1940 DOA: 10/31/2015 PCP: [name], MD   Brief Narrative:  [name] is a 75 y.o. 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    He was just discharged from our hospital 2 days ago after being treated for a COPD exacerbation and congestive heart failure exacerbation. During that time he was treated with aggressive diuresis and his diuretic medicines were adjusted on discharge. He states that on the day of admission he started feeling somewhat weak and had headache and some neck pain. He denied nausea, vomiting, diarrhea or belly pain. He then had an episode of passing out while sitting in a chair in his nursing home so he was sent to [facility] for further evaluation. Here he was noted to have leukocytosis, hypotension, and acute kidney injury. He's got chronic wounds on his legs which he says have not changed recently. He also has sacral decubitus ulcers. His wife says he has not walked in over a year. He confirms to me that he does not want aggressive resuscitation with life support or CPR.  ED Course: In the ED patient was hypotensive, given IVF resuscitation for sepsis, PCCM came to see him, initially had good BP response with IVF.  But then his EJ access infiltrated and his BP has now dropped to upper [id number] systolic.  They have now gotten another access site to allow [place] to resume IVF.  Also got zosyn and one dose of LZD for sepsis which is possibly related to his ulcers which PCCM suspects is source (especially a left leg ulcer that may have osteomyelitis associated with it).   Subjective: 8/10 A/O 4, felt lightheaded,--> syncope and collapse.  States no feeling in bilateral lower extremities (chronic). Per wife patient's lesions on feet have been worsening for~a year.     Assessment & Plan:   Principal Problem:   Septic shock ([place]) Active Problems:   Chronic systolic CHF (congestive heart failure) ([place])   Long term current use of anticoagulant therapy   Type II diabetes mellitus with neurological manifestations (HCC)   AKI (acute kidney injury) ([place])   Diabetic ulcer of left foot (HCC)   Diabetic ulcer of right foot (HCC)   Acute renal failure (HCC)   Gangrene (HCC)   Chronic atrial fibrillation ([place])   Uncontrolled type 2 diabetes mellitus with complication (HCC)  Septic shock/Bilateral foot Osteomyelitis vs Gangrene - Secondary to osteomyelitis/gangrene bilateral lower feet. X-ray by lateral feet nondiagnostic MRI pending. -Will consult orthopedic surgery on 8/11 for complete.  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 -Benadryl IV 25 mg, 30 minutes prior to vancomycin dose daily  Chronic systolic CHF -Strict in and out -Daily weight -Watch closely for fluid overloady.  HTN -Currently hypotensive see septic shock -We'll hold all BP medication  Chronic atrial fibrillation -Xarelto DC'd (preparation for surgery) -Heparin drip per pharmacy  Acute renal failure -most likely secondary to septic shock Lab Results  Component Value Date   CREATININE 1.63 (H) 11/01/2015   CREATININE 1.74 (H) 10/31/2015   CREATININE 0.88 10/29/2015   COPD -Titrate O2 to maintain SPO2 89-93%  DM Type 2 uncontrolled with complication -[id number] Hemoglobin A1c=  7.4 -Humulin 18 units BID -NovoLog 8 units QAC -Moderate SSI  Anxiety/Depression -Hold Ativan and Trazodone secondary to hypotension    DVT prophylaxis: Heparin drip Code Status: DO NOT RESUSCITATE Family Communication: Wife at bedside Disposition Plan: Completion of workup/orthopedic surgery recommendation   Consultants:    Procedures/Significant Events:  7/31 echocardiogram;- Left ventricle: mild LVH. LVEF = 30% to 35%. severe hypokinesis of the inferior and inferolateral walls. -(grade 2 diastolic dysfunction).- Mitral valve: moderate regurgitation.- Pulmonary arteries: PA peak pressure: 39 mm Hg (S). 8/9 right foot x-ray:Surgical clips are identified in the region the ankle. No acute fracture or subluxation. No cortical erosion. Soft tissue swelling superficial to the calcaneus. 8/9 left foot x-ray:Significant soft tissue swelling. -Soft tissue ulceration superficial to the calcaneus.-Status post great toe amputation.  Cultures 8/9 Blood left/right hand NGTD 8/10 wound culture pending    Antimicrobials: Zosyn 8/9>> Vancomycin 8/9>>    Devices    LINES / TUBES:      Continuous Infusions: . sodium chloride 125 mL/hr at 11/01/15 1227  . heparin 1,500 Units/hr (11/01/15 1700)     Objective: Vitals:   11/01/15 0829 11/01/15 1129 11/01/15 1200 11/01/15 1612  BP: (!) 102/56 (!) 102/49 (!) 87/47 99/81  Pulse: 86 93  94  Resp: 14 16 11 15   Temp: 97.3 F (36.3 C) 97.6 F (36.4 C)  97.4 F (36.3 C)  TempSrc: Oral Oral  Oral  SpO2: 100% 100% 99% 98%  Weight:      Height:        Intake/Output Summary (Last 24 hours) at 11/01/15 1815 Last data filed at 11/01/15 1700  Gross per 24 hour  Intake          5048.75 ml  Output             2925 ml  Net          2123.75 ml   Filed Weights   10/31/15 1542 10/31/15 2315  Weight: 117.5 kg (259 lb) 117.7 kg (259 lb 6.4 oz)    Examination:  General: A/O 4, NAD, No acute respiratory distress Eyes: negative  scleral hemorrhage, negative anisocoria, negative icterus ENT: Negative Runny nose, negative gingival bleeding, Neck:  Negative scars, masses, torticollis, lymphadenopathy, JVD Lungs: Clear to auscultation bilaterally without wheezes or crackles Cardiovascular: Regular rate and rhythm without murmur gallop or rub normal S1 and S2 Abdomen: Morbidly obese, negative abdominal pain, nondistended, positive soft, bowel sounds, no rebound, no ascites, no appreciable mass Extremities: left foot barely palpable DP/PT pulse, large foul-smelling gangrenous appearing ulceration of the heel with Achilles tendon visible/ulcer on dorsal surface grade 3. Right foot ulceration size of a sore dollar grade 3, poor DP/PT pulse Skin: See extremities Psychiatric:  Negative depression, negative anxiety, negative fatigue, negative mania  Central nervous system:  Cranial nerves II through XII intact, tongue/uvula midline, all extremities muscle strength 5/5, bilateral loss of sensation in feet. negative dysarthria, negative expressive aphasia, negative receptive aphasia.  .     Data Reviewed: Care during the described time interval was provided by me .  I have reviewed this patient's available data, including medical history, events of note, physical examination, and all test results as part  of my evaluation. I have personally reviewed and interpreted all radiology studies.  CBC:  Recent Labs Lab 10/29/15 0328 10/31/15 1608 11/01/15 0631  WBC 17.1* 22.3* 16.0*  HGB 11.0* 13.0 11.4*  HCT 38.3* 43.7 39.4  MCV 71.7* 72.4* 72.0*  PLT 310 304 [id number]   Basic Metabolic Panel:  Recent Labs Lab 10/27/15 0338 10/28/15 0407 10/28/15 1037 10/29/15 0328 10/31/15 1608 11/01/15 0631  NA 135 134* 133* 132* 131* 133*  K 3.2* 5.8* 4.3 3.3* 4.4 4.0  CL 88* 91* 90* 91* 90* 94*  CO2 36* 29 35* 34* 30 28  GLUCOSE 287* 158* 269* 183* 141* 125*  BUN 31* 33* 29* 26* 31* 35*  CREATININE 0.98 0.90 0.88 0.88 1.74* 1.63*    CALCIUM 8.8* 8.4* 8.6* 8.5* 9.2 8.2*  MG 2.3  --   --   --   --   --    GFR: Estimated Creatinine Clearance: 48 mL/min (by C-G formula based on SCr of 1.63 mg/dL). Liver Function Tests:  Recent Labs Lab 11/01/15 0046 11/01/15 0631  AST 17 18  ALT 18 16*  ALKPHOS 67 61  BILITOT 1.2 1.0  PROT 6.5 5.9*  ALBUMIN 3.3* 2.9*   No results for input(s): LIPASE, AMYLASE in the last 168 hours. No results for input(s): AMMONIA in the last 168 hours. Coagulation Profile: No results for input(s): INR, PROTIME in the last 168 hours. Cardiac Enzymes: No results for input(s): CKTOTAL, CKMB, CKMBINDEX, TROPONINI in the last 168 hours. BNP (last 3 results) No results for input(s): PROBNP in the last 8760 hours. HbA1C: No results for input(s): HGBA1C in the last 72 hours. CBG:  Recent Labs Lab 10/29/15 1140 10/31/15 2326 11/01/15 0826 11/01/15 1127 11/01/15 1607  GLUCAP 268* 122* 124* 212* 203*   Lipid Profile:  Recent Labs  11/01/15 1134  CHOL 109  HDL 34*  LDLCALC 58  TRIG 84  CHOLHDL 3.2   Thyroid Function Tests: No results for input(s): TSH, T4TOTAL, FREET4, T3FREE, THYROIDAB in the last 72 hours. Anemia Panel: No results for input(s): VITAMINB12, FOLATE, FERRITIN, TIBC, IRON, RETICCTPCT in the last 72 hours. Urine analysis:    Component Value Date/Time   COLORURINE YELLOW 10/31/2015 1608   APPEARANCEUR CLOUDY (A) 10/31/2015 1608   LABSPEC 1.012 10/31/2015 1608   PHURINE 8.5 (H) 10/31/2015 1608   GLUCOSEU NEGATIVE 10/31/2015 1608   HGBUR NEGATIVE 10/31/2015 1608   HGBUR negative 03/06/2010 1419   BILIRUBINUR NEGATIVE 10/31/2015 1608   KETONESUR NEGATIVE 10/31/2015 1608   PROTEINUR NEGATIVE 10/31/2015 1608   UROBILINOGEN 0.2 09/13/2014 0945   NITRITE NEGATIVE 10/31/2015 1608   LEUKOCYTESUR SMALL (A) 10/31/2015 1608   Sepsis Labs: @LABRCNTIP (procalcitonin:4,lacticidven:4)  ) Recent Results (from the past 240 hour(s))  Blood Culture (routine x 2)     Status:  None (Preliminary result)   Collection Time: 10/31/15  6:15 PM  Result Value Ref Range Status   Specimen Description BLOOD LEFT HAND  Final   Special Requests BOTTLES DRAWN AEROBIC AND ANAEROBIC 5ML   Final   Culture NO GROWTH < 24 HOURS  Final   Report Status PENDING  Incomplete  Blood Culture (routine x 2)     Status: None (Preliminary result)   Collection Time: 10/31/15  6:30 PM  Result Value Ref Range Status   Specimen Description BLOOD RIGHT HAND  Final   Special Requests BOTTLES DRAWN AEROBIC ONLY 5ML  Final   Culture NO GROWTH < 24 HOURS  Final   Report Status PENDING  Incomplete         Radiology Studies: Ct Head Wo Contrast  Result Date: 10/31/2015 CLINICAL DATA:  On blood thinner.  Headache. EXAM: CT HEAD WITHOUT CONTRAST TECHNIQUE: Contiguous axial images were obtained from the base of the skull through the vertex without intravenous contrast. COMPARISON:  08/06/2014 FINDINGS: Brain: No evidence of acute infarction, hemorrhage, hydrocephalus, or mass lesion/mass effect. Extensive remote left MCA territory infarction, upper division, extending into the posterior border zone. Remote small vessel infarcts in the bilateral cerebellum. Vascular: Atherosclerotic calcifications Skull: Negative for fracture or focal lesion. Sinuses/Orbits: Bilateral cataract resection. Other: None. IMPRESSION: 1. No acute finding or change compared to prior. 2. Remote left MCA territory infarction. Remote small bilateral cerebellar infarcts. Electronically Signed   By: Monte Fantasia M.D.   On: 10/31/2015 17:11   Dg Chest Port 1 View  Result Date: 10/31/2015 CLINICAL DATA:  Hypotension EXAM: PORTABLE CHEST 1 VIEW COMPARISON:  10/19/2015 FINDINGS: Cardiac shadow is enlarged. Postsurgical changes are again seen. The lungs are well aerated bilaterally. Mild interstitial changes are again seen without acute abnormality. IMPRESSION: No acute abnormality noted. Electronically Signed   By: Inez Catalina M.D.    On: 10/31/2015 18:01   Dg Foot Complete Left  Result Date: 10/31/2015 CLINICAL DATA:  Wound of bilateral heels for weeks. EXAM: LEFT FOOT - COMPLETE 3+ VIEW COMPARISON:  03/13/2011 MRI FINDINGS: Status post amputation of the great toe the level of the distal metatarsal. There is significant soft tissue swelling in the region of the metatarsophalangeal joints. There is soft tissue swelling superficial to the calcaneus. Additionally, foci of air are identified adjacent to the calcaneus consistent with ulceration. IMPRESSION: 1. Significant soft tissue swelling. 2. Soft tissue ulceration superficial to the calcaneus. 3. Status post great toe amputation. Electronically Signed   By: Nolon Nations M.D.   On: 10/31/2015 22:03   Dg Foot Complete Right  Result Date: 10/31/2015 CLINICAL DATA:  Wounds of bilateral heels for weeks. EXAM: RIGHT FOOT COMPLETE - 3+ VIEW COMPARISON:  None. FINDINGS: Surgical clips are identified in the region the ankle. No acute fracture or subluxation. No cortical erosion. Soft tissue swelling superficial to the calcaneus. IMPRESSION: Soft tissue swelling. Electronically Signed   By: Nolon Nations M.D.   On: 10/31/2015 22:01        Scheduled Meds: . [START ON 11/02/2015] antiseptic oral rinse  7 mL Mouth Rinse BID  . antiseptic oral rinse  7 mL Mouth Rinse BID  . atorvastatin  40 mg Oral q1800  . collagenase   Topical Daily  . diphenhydrAMINE  25 mg Intravenous Daily  . docusate sodium  100 mg Oral BID  . DULoxetine  90 mg Oral Daily  . feeding supplement (GLUCERNA SHAKE)  237 mL Oral Q24H  . ferrous sulfate  325 mg Oral Q breakfast  . gabapentin  400 mg Oral TID  . insulin aspart  0-15 Units Subcutaneous TID WC  . [START ON 11/02/2015] insulin aspart  8 Units Subcutaneous TID WC  . insulin NPH Human  18 Units Subcutaneous BID AC & HS  . lidocaine  1 patch Transdermal Q24H  . lubiprostone  8 mcg Oral BID WC  . pantoprazole  80 mg Oral Daily  .  piperacillin-tazobactam (ZOSYN)  IV  3.375 g Intravenous Q8H  . polyvinyl alcohol  1 drop Both Eyes BID  . sodium chloride  500 mL Intravenous Once  . sodium chloride flush  3 mL Intravenous Q12H  . tamsulosin  0.4 mg Oral QPC supper  . traZODone  100 mg Oral QHS  . vancomycin  1,250 mg Intravenous Q24H   Continuous Infusions: . sodium chloride 125 mL/hr at 11/01/15 1227  . heparin 1,500 Units/hr (11/01/15 1700)     LOS: 1 day    Time spent: 40 minutes    Zella Dewan, Geraldo Docker, MD [facility] Pager [phone]   If 7PM-7AM, please contact night-coverage [email or web address] Password [email or web address] 11/01/2015, 6:15 PM

## 2015-11-02 DIAGNOSIS — I70244 Atherosclerosis of native arteries of left leg with ulceration of heel and midfoot: Secondary | ICD-10-CM

## 2015-11-02 DIAGNOSIS — I70234 Atherosclerosis of native arteries of right leg with ulceration of heel and midfoot: Secondary | ICD-10-CM

## 2015-11-02 DIAGNOSIS — M8610 Other acute osteomyelitis, unspecified site: Secondary | ICD-10-CM

## 2015-11-02 LAB — GLUCOSE, CAPILLARY
GLUCOSE-CAPILLARY: 102 mg/dL — AB (ref 65–99)
GLUCOSE-CAPILLARY: 109 mg/dL — AB (ref 65–99)
GLUCOSE-CAPILLARY: 127 mg/dL — AB (ref 65–99)
GLUCOSE-CAPILLARY: 141 mg/dL — AB (ref 65–99)
Glucose-Capillary: 144 mg/dL — ABNORMAL HIGH (ref 65–99)

## 2015-11-02 LAB — CBC WITH DIFFERENTIAL/PLATELET
BASOS ABS: 0 10*3/uL (ref 0.0–0.1)
Basophils Relative: 0 %
Eosinophils Absolute: 0.1 10*3/uL (ref 0.0–0.7)
Eosinophils Relative: 1 %
HEMATOCRIT: 33.1 % — AB (ref 39.0–52.0)
HEMOGLOBIN: 10 g/dL — AB (ref 13.0–17.0)
LYMPHS ABS: 1.3 10*3/uL (ref 0.7–4.0)
LYMPHS PCT: 11 %
MCH: 21.3 pg — ABNORMAL LOW (ref 26.0–34.0)
MCHC: 30.2 g/dL (ref 30.0–36.0)
MCV: 70.4 fL — ABNORMAL LOW (ref 78.0–100.0)
MONOS PCT: 9 %
Monocytes Absolute: 1.1 10*3/uL — ABNORMAL HIGH (ref 0.1–1.0)
NEUTROS PCT: 79 %
Neutro Abs: 9.6 10*3/uL — ABNORMAL HIGH (ref 1.7–7.7)
Platelets: 204 10*3/uL (ref 150–400)
RBC: 4.7 MIL/uL (ref 4.22–5.81)
RDW: 22.7 % — AB (ref 11.5–15.5)
WBC: 12.1 10*3/uL — AB (ref 4.0–10.5)

## 2015-11-02 LAB — COMPREHENSIVE METABOLIC PANEL
ALT: 13 U/L — ABNORMAL LOW (ref 17–63)
ANION GAP: 13 (ref 5–15)
AST: 15 U/L (ref 15–41)
Albumin: 3.2 g/dL — ABNORMAL LOW (ref 3.5–5.0)
Alkaline Phosphatase: 52 U/L (ref 38–126)
BUN: 19 mg/dL (ref 6–20)
CHLORIDE: 99 mmol/L — AB (ref 101–111)
CO2: 26 mmol/L (ref 22–32)
Calcium: 8.5 mg/dL — ABNORMAL LOW (ref 8.9–10.3)
Creatinine, Ser: 0.87 mg/dL (ref 0.61–1.24)
Glucose, Bld: 121 mg/dL — ABNORMAL HIGH (ref 65–99)
POTASSIUM: 4.1 mmol/L (ref 3.5–5.1)
SODIUM: 138 mmol/L (ref 135–145)
Total Bilirubin: 0.8 mg/dL (ref 0.3–1.2)
Total Protein: 6.1 g/dL — ABNORMAL LOW (ref 6.5–8.1)

## 2015-11-02 LAB — HEPARIN LEVEL (UNFRACTIONATED): HEPARIN UNFRACTIONATED: 0.54 [IU]/mL (ref 0.30–0.70)

## 2015-11-02 LAB — MAGNESIUM: MAGNESIUM: 2.2 mg/dL (ref 1.7–2.4)

## 2015-11-02 LAB — HEMOGLOBIN A1C
Hgb A1c MFr Bld: 6.4 % — ABNORMAL HIGH (ref 4.8–5.6)
Mean Plasma Glucose: 137 mg/dL

## 2015-11-02 LAB — LACTIC ACID, PLASMA: Lactic Acid, Venous: 0.7 mmol/L (ref 0.5–1.9)

## 2015-11-02 MED ORDER — DIPHENHYDRAMINE HCL 50 MG/ML IJ SOLN
25.0000 mg | Freq: Two times a day (BID) | INTRAMUSCULAR | Status: DC
  Administered 2015-11-02 – 2015-11-09 (×14): 25 mg via INTRAVENOUS
  Filled 2015-11-02 (×15): qty 1

## 2015-11-02 MED ORDER — LORAZEPAM 0.5 MG PO TABS
0.5000 mg | ORAL_TABLET | Freq: Two times a day (BID) | ORAL | Status: DC | PRN
  Administered 2015-11-03 – 2015-11-10 (×3): 0.5 mg via ORAL
  Filled 2015-11-02 (×4): qty 1

## 2015-11-02 MED ORDER — VANCOMYCIN HCL 10 G IV SOLR
1250.0000 mg | Freq: Two times a day (BID) | INTRAVENOUS | Status: DC
  Administered 2015-11-02 – 2015-11-10 (×16): 1250 mg via INTRAVENOUS
  Filled 2015-11-02 (×19): qty 1250

## 2015-11-02 NOTE — Progress Notes (Signed)
[place] for heparin Indication: atrial fibrillation  Allergies  Allergen Reactions  . Diazepam Anxiety    REACTION: makes patient cry  . Vancomycin Other (See Comments)    "red man syndrome"    Patient Measurements: Height: 5\' 8"  (172.7 cm) Weight: 269 lb 10 oz (122.3 kg) IBW/kg (Calculated) : 68.4 Heparin Dosing Weight: 95kg  Vital Signs: Temp: 98.1 F (36.7 C) (08/11 1230) Temp Source: Oral (08/11 1230) BP: 107/54 (08/11 1230) Pulse Rate: 82 (08/11 1230)  Labs:  Recent Labs  10/31/15 1608 11/01/15 0046 11/01/15 0631 11/01/15 0825 11/01/15 1859 11/02/15 0539  HGB 13.0  --  11.4*  --   --  10.0*  HCT 43.7  --  39.4  --   --  33.1*  PLT 304  --  223  --   --  204  APTT  --  40*  --  45*  --   --   HEPARINUNFRC  --   --   --   --  0.42 0.54  CREATININE 1.74*  --  1.63*  --   --  0.87     Medications:  Infusions:  . sodium chloride 75 mL/hr at 11/02/15 0800  . heparin 1,500 Units/hr (11/02/15 1000)    Assessment: 76 yom presented to the ED with syncope. He is on chronic xarelto for history of afib but switched to heparin. H/H with slight trend down and platelets are WNL. No bleeding noted.   Heparin level at goal today.  No bleeding or complications noted.  Goal of Therapy:  Heparin level 0.3-0.7 units/ml aPTT 66-102 seconds Monitor platelets by anticoagulation protocol: Yes   Plan:  - Continue IV heparin at current rate - Daily CBC and heparin level -F/u plans to resume oral anticoagulation.  Uvaldo Rising, BCPS  Clinical Pharmacist Pager [phone]  11/02/2015 1:05 PM

## 2015-11-02 NOTE — Progress Notes (Signed)
PROGRESS NOTE    [name]  [id number] DOB: Aug 06, 1940 DOA: 10/31/2015 PCP: [name], MD   Brief Narrative:  [name] is a 75 y.o. 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    He was just discharged from our hospital 2 days ago after being treated for a COPD exacerbation and congestive heart failure exacerbation. During that time he was treated with aggressive diuresis and his diuretic medicines were adjusted on discharge. He states that on the day of admission he started feeling somewhat weak and had headache and some neck pain. He denied nausea, vomiting, diarrhea or belly pain. He then had an episode of passing out while sitting in a chair in his nursing home so he was sent to [facility] for further evaluation. Here he was noted to have leukocytosis, hypotension, and acute kidney injury. He's got chronic wounds on his legs which he says have not changed recently. He also has sacral decubitus ulcers. His wife says he has not walked in over a year. He confirms to me that he does not want aggressive resuscitation with life support or CPR.  ED Course: In the ED patient was hypotensive, given IVF resuscitation for sepsis, PCCM came to see him, initially had good BP response with IVF.  But then his EJ access infiltrated and his BP has now dropped to upper [id number] systolic.  They have now gotten another access site to allow [place] to resume IVF.  Also got zosyn and one dose of LZD for sepsis which is possibly related to his ulcers which PCCM suspects is source (especially a left leg ulcer that may have osteomyelitis associated with it).   Subjective: 8/11 A/O 4, pain RLL, states very anxious about  upcoming surgery.      Assessment & Plan:   Principal Problem:   Septic shock ([place]) Active Problems:   Chronic systolic CHF (congestive heart failure) ([place])   Long term current use of anticoagulant therapy   Type II diabetes mellitus with neurological manifestations (HCC)   AKI (acute kidney injury) ([place])   Diabetic ulcer of left foot (HCC)   Diabetic ulcer of right foot (HCC)   Acute renal failure (HCC)   Gangrene (HCC)   Chronic atrial fibrillation ([place])   Uncontrolled type 2 diabetes mellitus with complication (HCC)  Septic shock/Bilateral foot Osteomyelitis vs Gangrene - Secondary to osteomyelitis/gangrene bilateral lower feet. X-ray by lateral feet nondiagnostic MRI pending. -Will consult orthopedic surgery on 8/11 for complete.  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 -Benadryl IV 25 mg, 30 minutes prior to vancomycin dose daily -8/11 Consulted vascular surgery: Dr. Donzetta Matters states will perform left AKA on Monday or Tuesday  Chronic systolic CHF -Strict in and out since admission +3.2 L -Daily weight Filed Weights   10/31/15 1542 10/31/15 2315 11/02/15 0500  Weight: 117.5 kg (259 lb) 117.7 kg (259 lb 6.4 oz) 122.3 kg (269 lb 10  oz)  -Watch closely for fluid overloady.  HTN -Hold all BP medication  Chronic atrial fibrillation -Xarelto DC'd (preparation for surgery) -Heparin drip per pharmacy  Acute renal failure -most likely secondary to septic shock Lab Results  Component Value Date   CREATININE 0.87 11/02/2015   CREATININE 1.63 (H) 11/01/2015   CREATININE 1.74 (H) 10/31/2015   COPD -Titrate O2  to maintain SPO2 89-93%  DM Type 2 uncontrolled with complication -[id number] Hemoglobin A1c= 7.4 -Humulin 18 units BID -NovoLog 8 units QAC -Moderate SSI  Anxiety/Depression -Ativan 0.5 mg BID PRN -Hold Trazodone secondary to borderline BP     DVT prophylaxis: Heparin drip Code Status: DO NOT RESUSCITATE Family Communication: Wife at bedside Disposition Plan: Completion of workup/orthopedic surgery recommendation   Consultants:  Dr.Brandon Dione [place] vascular surgery    Procedures/Significant Events:  7/31 echocardiogram;- Left ventricle: mild LVH. LVEF = 30% to 35%. severe hypokinesis of the inferior and inferolateral walls. -(grade 2 diastolic dysfunction).- Mitral valve: moderate regurgitation.- Pulmonary arteries: PA peak pressure: 39 mm Hg (S). 8/9 right foot x-ray:Surgical clips are identified in the region the ankle. No acute fracture or subluxation. No cortical erosion. Soft tissue swelling superficial to the calcaneus. 8/9 left foot x-ray:Significant soft tissue swelling. -Soft tissue ulceration superficial to the calcaneus.-Status post great toe amputation. 8/10 MRI right foot: Negative abscess or osteomyelitis 8/10 MRI left foot: Positive osteomyelitis--Small bone infarct lateral talus.     Cultures 8/9 Blood left/right hand NGTD 8/10 wound culture pending    Antimicrobials: Zosyn 8/9>> Vancomycin 8/9>>    Devices    LINES / TUBES:      Continuous Infusions: . sodium chloride 75 mL/hr at 11/02/15 1300  . heparin 1,500 Units/hr (11/02/15 1000)     Objective: Vitals:   11/02/15 0800 11/02/15 0900 11/02/15 1230 11/02/15 1617  BP: (!) 119/59 (!) 108/58 (!) 107/54 (!) 108/53  Pulse: 83  82 (!) 101  Resp: (!) 22 17 15  (!) 23  Temp: 97.6 F (36.4 C)  98.1 F (36.7 C) 97.8 F (36.6 C)  TempSrc: Oral  Oral Oral  SpO2: 97% 98% 94% 95%  Weight:      Height:        Intake/Output Summary (Last 24 hours) at 11/02/15 1851 Last data filed  at 11/02/15 1800  Gross per 24 hour  Intake          2571.25 ml  Output             1425 ml  Net          1146.25 ml   Filed Weights   10/31/15 1542 10/31/15 2315 11/02/15 0500  Weight: 117.5 kg (259 lb) 117.7 kg (259 lb 6.4 oz) 122.3 kg (269 lb 10 oz)    Examination:  General: A/O 4, NAD, No acute respiratory distress Eyes: negative scleral hemorrhage, negative anisocoria, negative icterus ENT: Negative Runny nose, negative gingival bleeding, Neck:  Negative scars, masses, torticollis, lymphadenopathy, JVD Lungs: Clear to auscultation bilaterally without wheezes or crackles Cardiovascular: Regular rate and rhythm without murmur gallop or rub normal S1 and S2 Abdomen: Morbidly obese, negative abdominal pain, nondistended, positive soft, bowel sounds, no rebound, no ascites, no appreciable mass Extremities: left foot barely palpable DP/PT pulse, large foul-smelling gangrenous appearing ulceration of the heel with Achilles tendon visible/ulcer on dorsal surface grade 3. Right foot ulceration size of a sore dollar grade 3, poor DP/PT pulse Skin: See extremities Psychiatric:  Negative depression, negative anxiety, negative fatigue, negative mania  Central  nervous system:  Cranial nerves II through XII intact, tongue/uvula midline, all extremities muscle strength 5/5, bilateral loss of sensation in feet. negative dysarthria, negative expressive aphasia, negative receptive aphasia.  .     Data Reviewed: Care during the described time interval was provided by me .  I have reviewed this patient's available data, including medical history, events of note, physical examination, and all test results as part of my evaluation. I have personally reviewed and interpreted all radiology studies.  CBC:  Recent Labs Lab 10/29/15 0328 10/31/15 1608 11/01/15 0631 11/02/15 0539  WBC 17.1* 22.3* 16.0* 12.1*  NEUTROABS  --   --   --  9.6*  HGB 11.0* 13.0 11.4* 10.0*  HCT 38.3* 43.7 39.4 33.1*    MCV 71.7* 72.4* 72.0* 70.4*  PLT 310 304 223 0000000   Basic Metabolic Panel:  Recent Labs Lab 10/27/15 0338  10/28/15 1037 10/29/15 0328 10/31/15 1608 11/01/15 0631 11/01/15 1840 11/02/15 0539  NA 135  < > 133* 132* 131* 133*  --  138  K 3.2*  < > 4.3 3.3* 4.4 4.0  --  4.1  CL 88*  < > 90* 91* 90* 94*  --  99*  CO2 36*  < > 35* 34* 30 28  --  26  GLUCOSE 287*  < > 269* 183* 141* 125*  --  121*  BUN 31*  < > 29* 26* 31* 35*  --  19  CREATININE 0.98  < > 0.88 0.88 1.74* 1.63*  --  0.87  CALCIUM 8.8*  < > 8.6* 8.5* 9.2 8.2*  --  8.5*  MG 2.3  --   --   --   --   --  2.1 2.2  < > = values in this interval not displayed. GFR: Estimated Creatinine Clearance: 92 mL/min (by C-G formula based on SCr of 0.87 mg/dL). Liver Function Tests:  Recent Labs Lab 11/01/15 0046 11/01/15 0631 11/02/15 0539  AST 17 18 15   ALT 18 16* 13*  ALKPHOS 67 61 52  BILITOT 1.2 1.0 0.8  PROT 6.5 5.9* 6.1*  ALBUMIN 3.3* 2.9* 3.2*   No results for input(s): LIPASE, AMYLASE in the last 168 hours. No results for input(s): AMMONIA in the last 168 hours. Coagulation Profile: No results for input(s): INR, PROTIME in the last 168 hours. Cardiac Enzymes: No results for input(s): CKTOTAL, CKMB, CKMBINDEX, TROPONINI in the last 168 hours. BNP (last 3 results) No results for input(s): PROBNP in the last 8760 hours. HbA1C:  Recent Labs  11/01/15 1134  HGBA1C 6.4*   CBG:  Recent Labs Lab 11/01/15 1607 11/02/15 0009 11/02/15 0808 11/02/15 1232 11/02/15 1608  GLUCAP 203* 109* 127* 141* 144*   Lipid Profile:  Recent Labs  11/01/15 1134  CHOL 109  HDL 34*  LDLCALC 58  TRIG 84  CHOLHDL 3.2   Thyroid Function Tests: No results for input(s): TSH, T4TOTAL, FREET4, T3FREE, THYROIDAB in the last 72 hours. Anemia Panel: No results for input(s): VITAMINB12, FOLATE, FERRITIN, TIBC, IRON, RETICCTPCT in the last 72 hours. Urine analysis:    Component Value Date/Time   COLORURINE YELLOW  10/31/2015 1608   APPEARANCEUR CLOUDY (A) 10/31/2015 1608   LABSPEC 1.012 10/31/2015 1608   PHURINE 8.5 (H) 10/31/2015 1608   GLUCOSEU NEGATIVE 10/31/2015 1608   HGBUR NEGATIVE 10/31/2015 1608   HGBUR negative 03/06/2010 1419   BILIRUBINUR NEGATIVE 10/31/2015 1608   KETONESUR NEGATIVE 10/31/2015 1608   PROTEINUR NEGATIVE 10/31/2015 1608   UROBILINOGEN 0.2  09/13/2014 0945   NITRITE NEGATIVE 10/31/2015 1608   LEUKOCYTESUR SMALL (A) 10/31/2015 1608   Sepsis Labs: @LABRCNTIP (procalcitonin:4,lacticidven:4)  ) Recent Results (from the past 240 hour(s))  Blood Culture (routine x 2)     Status: None (Preliminary result)   Collection Time: 10/31/15  6:15 PM  Result Value Ref Range Status   Specimen Description BLOOD LEFT HAND  Final   Special Requests BOTTLES DRAWN AEROBIC AND ANAEROBIC 5ML   Final   Culture NO GROWTH 2 DAYS  Final   Report Status PENDING  Incomplete  Blood Culture (routine x 2)     Status: None (Preliminary result)   Collection Time: 10/31/15  6:30 PM  Result Value Ref Range Status   Specimen Description BLOOD RIGHT HAND  Final   Special Requests BOTTLES DRAWN AEROBIC ONLY 5ML  Final   Culture NO GROWTH 2 DAYS  Final   Report Status PENDING  Incomplete         Radiology Studies: Mr Foot Right Wo Contrast  Result Date: 11/02/2015 CLINICAL DATA:  Soft tissue wound on the heel. EXAM: MRI OF THE RIGHT HIND IN FOOT WITHOUT CONTRAST TECHNIQUE: Multiplanar, multisequence MR imaging was performed. No intravenous contrast was administered. COMPARISON:  Radiographs dated 10/31/2015 FINDINGS: There is a superficial soft tissue ulceration on the posterior aspect of the heel approximately 5 mm deep. There is slight edema in the adjacent subcutaneous fat. There is no abscess or osteomyelitis. No joint effusions. The achilles tendon at the tendons at the medial, lateral and anterior aspects of the ankle and hindfoot appear normal. IMPRESSION: Superficial soft tissue ulceration of  the heel. No underlying abscess or osteomyelitis. Electronically Signed   By: Lorriane Shire M.D.   On: 11/02/2015 08:13   Mr Foot Left Wo Contrast  Result Date: 11/02/2015 CLINICAL DATA:  Diabetic patient with bilateral heel wounds. Septic shock. No known injury. Initial encounter. EXAM: MRI OF THE LEFT FOOT WITHOUT CONTRAST TECHNIQUE: Multiplanar, multisequence MR imaging was performed. No intravenous contrast was administered. COMPARISON:  Plain films left foot 10/31/2015. FINDINGS: A large skin ulceration seen over the dorsal margin of the calcaneus. Marrow edema is seen in the posterior aspect of the calcaneus most notable in the dorsal process and lateral calcaneus. No soft tissue abscess is identified. Mild edema is seen in the distal Achilles tendon eccentric with the medial side. Major ligaments and tendons about the ankle are intact. No joint effusion is identified. A bone small infarct is seen in the lateral talar without fragmentation or collapse. Mild degenerative change at the calcaneocuboid joint is noted. IMPRESSION: Large skin wound over the heel with marrow edema in the posterior calcaneus subjacent to the wound consistent osteomyelitis. Negative for abscess or septic joint. Small bone infarct lateral talus. Electronically Signed   By: Inge Rise M.D.   On: 11/02/2015 08:16   Dg Foot Complete Left  Result Date: 10/31/2015 CLINICAL DATA:  Wound of bilateral heels for weeks. EXAM: LEFT FOOT - COMPLETE 3+ VIEW COMPARISON:  03/13/2011 MRI FINDINGS: Status post amputation of the great toe the level of the distal metatarsal. There is significant soft tissue swelling in the region of the metatarsophalangeal joints. There is soft tissue swelling superficial to the calcaneus. Additionally, foci of air are identified adjacent to the calcaneus consistent with ulceration. IMPRESSION: 1. Significant soft tissue swelling. 2. Soft tissue ulceration superficial to the calcaneus. 3. Status post great  toe amputation. Electronically Signed   By: Nolon Nations M.D.   On:  10/31/2015 22:03   Dg Foot Complete Right  Result Date: 10/31/2015 CLINICAL DATA:  Wounds of bilateral heels for weeks. EXAM: RIGHT FOOT COMPLETE - 3+ VIEW COMPARISON:  None. FINDINGS: Surgical clips are identified in the region the ankle. No acute fracture or subluxation. No cortical erosion. Soft tissue swelling superficial to the calcaneus. IMPRESSION: Soft tissue swelling. Electronically Signed   By: Nolon Nations M.D.   On: 10/31/2015 22:01        Scheduled Meds: . antiseptic oral rinse  7 mL Mouth Rinse BID  . antiseptic oral rinse  7 mL Mouth Rinse BID  . atorvastatin  40 mg Oral q1800  . collagenase   Topical Daily  . diphenhydrAMINE  25 mg Intravenous Q12H  . docusate sodium  100 mg Oral BID  . DULoxetine  90 mg Oral Daily  . feeding supplement (GLUCERNA SHAKE)  237 mL Oral Q24H  . ferrous sulfate  325 mg Oral Q breakfast  . gabapentin  400 mg Oral TID  . insulin aspart  0-15 Units Subcutaneous TID WC  . insulin aspart  8 Units Subcutaneous TID WC  . insulin NPH Human  18 Units Subcutaneous BID AC & HS  . lidocaine  1 patch Transdermal Q24H  . lubiprostone  8 mcg Oral BID WC  . pantoprazole  80 mg Oral Daily  . piperacillin-tazobactam (ZOSYN)  IV  3.375 g Intravenous Q8H  . polyvinyl alcohol  1 drop Both Eyes BID  . sodium chloride flush  3 mL Intravenous Q12H  . tamsulosin  0.4 mg Oral QPC supper  . traZODone  100 mg Oral QHS  . vancomycin  1,250 mg Intravenous Q12H   Continuous Infusions: . sodium chloride 75 mL/hr at 11/02/15 1300  . heparin 1,500 Units/hr (11/02/15 1000)     LOS: 2 days    Time spent: 40 minutes    Lindalee Huizinga, Geraldo Docker, MD [facility] Pager [phone]   If 7PM-7AM, please contact night-coverage [email or web address] Password [facility] 11/02/2015, 6:51 PM

## 2015-11-02 NOTE — Consult Note (Signed)
Hospital Consult    Reason for Consult:  Bilateral heel ulcers Referring Physician:  Dr. Sherral Hammers MRN #:  [id number]  History of Present Illness: This is a 75 y.o. male poor historian with remote history of left external iliac to femoral bypass, left fem pop for aneurysm and L CEA. 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 Scot Dock. Recently evaluated in our office for bilateral heel ulcers and wound care continued without plan for intervention. Now admitted with hypotension and aki now resolved s/p fluid resuscitation. Patient complains only of bilateral leg pain at this time and denies any fevers. Has been told he will likely require at least left lower extremity amputation. He has not walked in a long time but does not have a specific time period. Takes xarelto.  Past Medical History:  Diagnosis Date  . Adhesive capsulitis   . Angina   . Anxiety   . CAD (coronary artery disease)   . CHF (congestive heart failure) ([place])   . Chronic back pain   . Chronic kidney disease    hx of BPH  . COPD (chronic obstructive pulmonary disease) ([place])   . CVA (cerebral infarction) Questionable history  . Depression   . Diabetes mellitus   . Diverticulosis   . Falls frequently 06/2014  . GERD (gastroesophageal reflux disease)   . Hyperlipidemia   . Hypertension   . 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 Myocardial infarction ([place])   . Neuromuscular disorder ([place])    HX of diabetic periferal neuropathy  . On home oxygen therapy    "1.5L prn" (04/12/2014)  . Osteomyelitis ([place]) 2012   Sternoclavicular joint   . PE (pulmonary embolism) 10-15 years ago   Lifelong Coumadin  . Peripheral vascular disease ([place])   . Shortness of breath   . Sleep apnea     Past Surgical History:  Procedure Laterality Date  . AMPUTATION  03/16/2011   Procedure: AMPUTATION DIGIT;  Surgeon: Angelia Mould, MD;  Location: [facility] OR;  Service: Vascular;   Laterality: Left;  Great toe  . CARDIAC CATHETERIZATION  October 18, 2010   Known obstructive disease, no further blockages  . CARDIAC CATHETERIZATION  03/17/2005   EF 35-40%  . COLONOSCOPY  2006    Multiple polyps removed, repeat in 5 years  . CORONARY ARTERY BYPASS GRAFT  1992  . [place], 2002  . DOPPLER ECHOCARDIOGRAPHY  Sept 2011   EF 50-55% with some impaired diastolic relaxation  . FEMORAL-POPLITEAL BYPASS GRAFT  2008   Left  . FRACTURE SURGERY  1980s   Hip  . HIP ARTHROPLASTY Left   . Right iliopopliteal bypass - [place]  . septic arthritis     Removal of infected CABG wire by Dr Arlyce Dice - 2011  . SPINE SURGERY  2002   Cervical fusion vertebroplasty C3-4-5  . US ECHOCARDIOGRAPHY  03/04/2006   EF 50-55%    Allergies  Allergen Reactions  . Diazepam Anxiety    REACTION: makes patient cry  . Vancomycin Other (See Comments)    "red man syndrome"    Prior to Admission medications   Medication Sig Start Date End Date Taking? Authorizing Provider  carvedilol (COREG) 3.125 MG tablet Take 1 tablet (3.125 mg total) by mouth 2 (two) times daily with a meal. 10/29/15  Yes Orson Eva, MD  docusate sodium (COLACE) 100 MG capsule Take 100 mg by mouth 2 (  two) times daily.   Yes Historical Provider, MD  DULoxetine (CYMBALTA) 30 MG capsule Take 90 mg by mouth daily.   Yes Historical Provider, MD  ferrous sulfate 325 (65 FE) MG tablet Take 1 tablet (325 mg total) by mouth daily with breakfast. 10/29/15  Yes Orson Eva, MD  furosemide (LASIX) 80 MG tablet Take 1 tablet (80 mg total) by mouth 2 (two) times daily. 10/29/15  Yes Orson Eva, MD  gabapentin (NEURONTIN) 400 MG capsule Take 400 mg by mouth 3 (three) times daily.   Yes Historical Provider, MD  HYDROcodone-acetaminophen (NORCO) 7.5-325 MG tablet Take 1 tablet by mouth every 6 (six) hours as needed for moderate pain. 10/29/15  Yes Orson Eva, MD  insulin lispro (HUMALOG KWIKPEN) 100 UNIT/ML KiwkPen Inject 8 Units into the  skin 3 (three) times daily before meals. An additional if CBG is 0-149=0 units, 150-400= 5 units before meals and at bedtime   Yes Historical Provider, MD  insulin NPH Human (HUMULIN N,NOVOLIN N) 100 UNIT/ML injection Inject 0.24 mLs (24 Units total) into the skin 2 (two) times daily before a meal. 10/29/15  Yes Orson Eva, MD  lidocaine (LIDODERM) 5 % Place 1 patch onto the skin daily.    Yes Historical Provider, MD  lubiprostone (AMITIZA) 8 MCG capsule Take 8 mcg by mouth 2 (two) times daily with a meal.   Yes Historical Provider, MD  Multiple Vitamins-Minerals (DECUBI-VITE PO) Take 1 tablet by mouth daily.    Yes Historical Provider, MD  nitroGLYCERIN (NITROSTAT) 0.4 MG SL tablet Place 1 tablet (0.4 mg total) under the tongue as needed. For chest pains 10/19/13  Yes Alveda Reasons, MD  omeprazole (PRILOSEC) 40 MG capsule Take 1 capsule (40 mg total) by mouth daily. 05/10/14  Yes Alveda Reasons, MD  OXYGEN Inhale 2 L into the lungs as needed (for congestion, cough, and wheezing related to COPD).   Yes Historical Provider, MD  Propylene Glycol (SYSTANE BALANCE) 0.6 % SOLN Place 1 drop into both eyes 2 (two) times daily.    Yes Historical Provider, MD  PROTEIN PO Take 60 mLs by mouth 3 (three) times daily.    Yes Historical Provider, MD  rivaroxaban (XARELTO) 20 MG TABS tablet Take 20 mg by mouth daily with supper.   Yes Historical Provider, MD  simvastatin (ZOCOR) 80 MG tablet Take 1 tablet (80 mg total) by mouth daily. 06/06/14  Yes Alveda Reasons, MD  spironolactone (ALDACTONE) 25 MG tablet Take 1 tablet (25 mg total) by mouth daily. 10/29/15  Yes Orson Eva, MD  tamsulosin (FLOMAX) 0.4 MG CAPS capsule Take 1 capsule (0.4 mg total) by mouth daily after supper. 10/19/13  Yes Alveda Reasons, MD  traZODone (DESYREL) 100 MG tablet Take 100 mg by mouth at bedtime.    Yes Historical Provider, MD  traZODone (DESYREL) 50 MG tablet Take 100 mg by mouth at bedtime. 09/22/15  Yes Historical Provider, MD  Vitamin  D, Ergocalciferol, (DRISDOL) 50000 units CAPS capsule Take 1 capsule (50,000 Units total) by mouth every 7 (seven) days. 05/03/15  Yes Gerlene Fee, NP  lisinopril (PRINIVIL,ZESTRIL) 2.5 MG tablet Take 2.5 mg by mouth daily.  06/06/14   Historical Provider, MD  LORazepam (ATIVAN) 0.5 MG tablet Take 0.5 tablets (0.25 mg total) by mouth daily as needed for anxiety. 10/29/15   Orson Eva, MD    Social History   Social History  . Marital status: Married    Spouse name: Butch Penny  . Number  of children: 3  . Years of education: 12   Occupational History  . Retired-machinest/welder Retired   Social History Main Topics  . Smoking status: Former Smoker    Quit date: 06/19/2000  . Smokeless tobacco: Never Used  . Alcohol use No  . Drug use: No  . Sexual activity: No   Other Topics Concern  . Not on file   Social History Narrative   Health Care POA: TBD   Emergency Contact: wife, Butch Penny [phone]   End of Life Plan: pt does not have AD and not interested.   Who lives with you: Butch Penny and step-daughter-Betty   Any pets: none   Diet: Pt has a varied diet of protein, starch and vegetables.   Exercise: Pt does not have regular exercise routine.   Seatbelts: Pt reports wearing seatbelt when in vehicles.    Hobbies: listening to car races.               Family History  Problem Relation Age of Onset  . Heart disease Father   . Heart disease Mother     ROS: [x]  Positive   [ ]  Negative   [ ]  All sytems reviewed and are negative  Cardiovascular: []  chest pain/pressure []  palpitations []  SOB lying flat []  DOE []  pain in legs while walking [x]  pain in legs at rest []  pain in legs at night [x]  non-healing ulcers []  hx of DVT [x]  swelling in legs  Pulmonary: []  productive cough []  asthma/wheezing []  home O2  Neurologic: []  weakness in []  arms []  legs []  numbness in []  arms []  legs [x]  hx of CVA []  mini stroke [] difficulty speaking or slurred speech []  temporary loss of vision in  one eye []  dizziness  Hematologic: []  hx of cancer []  bleeding problems []  problems with blood clotting easily  Endocrine:   x[]  diabetes []  thyroid disease  GI []  vomiting blood []  blood in stool  GU: []  CKD/renal failure []  HD--[]  M/W/F or []  T/T/S []  burning with urination []  blood in urine  Psychiatric: []  anxiety []  depression  Musculoskeletal: []  arthritis []  joint pain  Integumentary: []  rashes x[]  ulcers  Constitutional: []  fever []  chills   Physical Examination  Vitals:   11/02/15 0700 11/02/15 0800  BP: (!) 118/56 (!) 119/59  Pulse:  83  Resp: 16 (!) 22  Temp:  97.6 F (36.4 C)   Body mass index is 41 kg/m.  General:  WDWN in NAD Gait: Not observed, per patient does not walk HENT: WNL, normocephalic Pulmonary: normal non-labored breathing Cardiac: regular, without  Murmurs Abdomen:  soft, minimal ttp, no pulsatility Skin: L heal with 4cm round ulceration with frank necrosis, R heel ulcer 1cm with fibrinous exudate  Vascular Exam/Pulses:  Right Left  Femoral 2+ (normal) 2+ (normal)  Popliteal 1+ (weak) 2+ (normal)  DP signal signal  PT signal signal    Neurologic: A&O X 3; Appropriate Affect ; SENSATION: normal; MOTOR FUNCTION:  moving all extremities equally. Speech is fluent/normal  CBC    Component Value Date/Time   WBC 12.1 (H) 11/02/2015 0539   RBC 4.70 11/02/2015 0539   HGB 10.0 (L) 11/02/2015 0539   HCT 33.1 (L) 11/02/2015 0539   PLT 204 11/02/2015 0539   MCV 70.4 (L) 11/02/2015 0539   MCH 21.3 (L) 11/02/2015 0539   MCHC 30.2 11/02/2015 0539   RDW 22.7 (H) 11/02/2015 0539   LYMPHSABS 1.3 11/02/2015 0539   MONOABS 1.1 (H) 11/02/2015 0539   EOSABS  0.1 11/02/2015 0539   BASOSABS 0.0 11/02/2015 0539    BMET    Component Value Date/Time   NA 138 11/02/2015 0539   NA 136 (A) 07/26/2015   K 4.1 11/02/2015 0539   CL 99 (L) 11/02/2015 0539   CO2 26 11/02/2015 0539   GLUCOSE 121 (H) 11/02/2015 0539   BUN 19 11/02/2015  0539   BUN 37 (A) 07/26/2015   CREATININE 0.87 11/02/2015 0539   CREATININE 1.11 06/14/2013 1221   CALCIUM 8.5 (L) 11/02/2015 0539   GFRNONAA >60 11/02/2015 0539   GFRAA >60 11/02/2015 0539    COAGS: Lab Results  Component Value Date   INR 1.39 10/19/2015   INR 1.7 (A) 08/13/2015   INR 3.5 (A) 08/01/2015   PROTIME 19.9 (A) 08/13/2015   PROTIME 35.5 (A) 08/01/2015   PROTIME 21.0 (A) 07/09/2015     Non-Invasive Vascular Imaging:         ASSESSMENT/PLAN: This is a 75 y.o. male bilateral heel ulcers, small on right but significant on left. Previous ABI likely unreliable given diabetes but consistent with current signals that are multiphasic bilaterally. Will likely need Above knee amputation on left but likely can get by with wound care on right. Will plan angiogram early next week by either me or one of my partners with possible intervention. This may difficult given previous LLE bypasses and may require brachial approach. Discussed with patient and Dr. Sherral Hammers. Ok to continue to [employer] and antiplatelet.   Messiah Ahr C. Donzetta Matters, MD [facility] Office: [phone] Pager: [phone]

## 2015-11-02 NOTE — Progress Notes (Signed)
Pharmacy Antibiotic Note  [name] is a 75 y.o. male admitted on 10/31/2015 with r/o osteo.  Pharmacy has been consulted for vancomycin and zosyn dosing. Pt is afebrile and WBC is improving.  Scr rapidly falling.  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) - Zosyn 3.375gm IV Q8H (4 hr inf) - F/u renal fxn, C&S, clinical status and trough at SS  Height: 5\' 8"  (172.7 cm) Weight: 269 lb 10 oz (122.3 kg) IBW/kg (Calculated) : 68.4  Temp (24hrs), Avg:97.8 F (36.6 C), Min:97.4 F (36.3 C), Max:98.1 F (36.7 C)   Recent Labs Lab 10/28/15 1037 10/29/15 0328 10/31/15 1608 10/31/15 1652 10/31/15 1915 11/01/15 0631 11/01/15 1134 11/01/15 1229 11/02/15 0539 11/02/15 0541  WBC  --  17.1* 22.3*  --   --  16.0*  --   --  12.1*  --   CREATININE 0.88 0.88 1.74*  --   --  1.63*  --   --  0.87  --   LATICACIDVEN  --   --   --  3.16* 3.61*  --  1.5 2.2*  --  0.7    Estimated Creatinine Clearance: 92 mL/min (by C-G formula based on SCr of 0.87 mg/dL).    Allergies  Allergen Reactions  . Diazepam Anxiety    REACTION: makes patient cry  . Vancomycin Other (See Comments)    "red man syndrome"    Antimicrobials this admission: Vanc 8/9>> Zosyn 8/9>> Linezolid x 1 8/9  Dose adjustments this admission: 8/11 > increase vancomycin to 1250 mg q 12 hrs  Microbiology results: 8/9 BCx: ngtd 8/9 UCx: pending Wound Cx?   Uvaldo Rising, BCPS  Clinical Pharmacist Pager [phone]  11/02/2015 1:08 PM

## 2015-11-03 DIAGNOSIS — M86672 Other chronic osteomyelitis, left ankle and foot: Secondary | ICD-10-CM | POA: Diagnosis present

## 2015-11-03 DIAGNOSIS — E08621 Diabetes mellitus due to underlying condition with foot ulcer: Secondary | ICD-10-CM

## 2015-11-03 LAB — IRON AND TIBC
IRON: 36 ug/dL — AB (ref 45–182)
SATURATION RATIOS: 15 % — AB (ref 17.9–39.5)
TIBC: 242 ug/dL — AB (ref 250–450)
UIBC: 206 ug/dL

## 2015-11-03 LAB — CBC WITH DIFFERENTIAL/PLATELET
Basophils Absolute: 0 10*3/uL (ref 0.0–0.1)
Basophils Relative: 0 %
EOS PCT: 1 %
Eosinophils Absolute: 0.1 10*3/uL (ref 0.0–0.7)
HEMATOCRIT: 32.4 % — AB (ref 39.0–52.0)
HEMOGLOBIN: 9.3 g/dL — AB (ref 13.0–17.0)
LYMPHS PCT: 13 %
Lymphs Abs: 1.5 10*3/uL (ref 0.7–4.0)
MCH: 20.9 pg — AB (ref 26.0–34.0)
MCHC: 28.7 g/dL — ABNORMAL LOW (ref 30.0–36.0)
MCV: 72.8 fL — AB (ref 78.0–100.0)
MONOS PCT: 9 %
Monocytes Absolute: 1 10*3/uL (ref 0.1–1.0)
Neutro Abs: 8.8 10*3/uL — ABNORMAL HIGH (ref 1.7–7.7)
Neutrophils Relative %: 77 %
PLATELETS: 205 10*3/uL (ref 150–400)
RBC: 4.45 MIL/uL (ref 4.22–5.81)
RDW: 23.1 % — ABNORMAL HIGH (ref 11.5–15.5)
WBC: 11.4 10*3/uL — AB (ref 4.0–10.5)

## 2015-11-03 LAB — GLUCOSE, CAPILLARY
GLUCOSE-CAPILLARY: 146 mg/dL — AB (ref 65–99)
GLUCOSE-CAPILLARY: 93 mg/dL (ref 65–99)
Glucose-Capillary: 111 mg/dL — ABNORMAL HIGH (ref 65–99)
Glucose-Capillary: 116 mg/dL — ABNORMAL HIGH (ref 65–99)
Glucose-Capillary: 110 mg/dL — ABNORMAL HIGH (ref 65–99)
Glucose-Capillary: 105 mg/dL — ABNORMAL HIGH (ref 65–99)

## 2015-11-03 LAB — COMPREHENSIVE METABOLIC PANEL
ALT: 13 U/L — AB (ref 17–63)
AST: 13 U/L — AB (ref 15–41)
Albumin: 3 g/dL — ABNORMAL LOW (ref 3.5–5.0)
Alkaline Phosphatase: 48 U/L (ref 38–126)
Anion gap: 6 (ref 5–15)
BUN: 12 mg/dL (ref 6–20)
CHLORIDE: 100 mmol/L — AB (ref 101–111)
CO2: 30 mmol/L (ref 22–32)
Calcium: 8.2 mg/dL — ABNORMAL LOW (ref 8.9–10.3)
Creatinine, Ser: 0.83 mg/dL (ref 0.61–1.24)
GFR calc Af Amer: >60 mL/min (ref >60)
Glucose, Bld: 117 mg/dL — ABNORMAL HIGH (ref 65–99)
POTASSIUM: 3.9 mmol/L (ref 3.5–5.1)
SODIUM: 136 mmol/L (ref 135–145)
Total Bilirubin: 0.6 mg/dL (ref 0.3–1.2)
Total Protein: 5.5 g/dL — ABNORMAL LOW (ref 6.5–8.1)

## 2015-11-03 LAB — HEPARIN LEVEL (UNFRACTIONATED)
HEPARIN UNFRACTIONATED: 0.29 [IU]/mL — AB (ref 0.30–0.70)
Heparin Unfractionated: 0.27 IU/mL — ABNORMAL LOW (ref 0.30–0.70)

## 2015-11-03 LAB — RETICULOCYTES
RBC.: 4.79 MIL/uL (ref 4.22–5.81)
Retic Count, Absolute: 47.9 10*3/uL (ref 19.0–186.0)
Retic Ct Pct: 1 % (ref 0.4–3.1)

## 2015-11-03 LAB — FERRITIN: Ferritin: 127 ng/mL (ref 24–336)

## 2015-11-03 LAB — FOLATE: FOLATE: 19.9 ng/mL (ref >5.9)

## 2015-11-03 LAB — MAGNESIUM: MAGNESIUM: 2.1 mg/dL (ref 1.7–2.4)

## 2015-11-03 LAB — LACTATE DEHYDROGENASE: LDH: 115 U/L (ref 98–192)

## 2015-11-03 LAB — VITAMIN B12: VITAMIN B 12: 247 pg/mL (ref 180–914)

## 2015-11-03 NOTE — Progress Notes (Signed)
ANTICOAGULATION CONSULT NOTE - Follow Up Consult  Pharmacy Consult for heparin Indication: atrial fibrillation  Labs:  Recent Labs  11/01/15 0046 11/01/15 0631 11/01/15 0825 11/01/15 1859 11/02/15 0539 11/03/15 0251  HGB  --  11.4*  --   --  10.0* 9.3*  HCT  --  39.4  --   --  33.1* 32.4*  PLT  --  223  --   --  204 PENDING  APTT 40*  --  45*  --   --   --   HEPARINUNFRC  --   --   --  0.42 0.54 0.29*  CREATININE  --  1.63*  --   --  0.87 0.83    Assessment: 75yo male now slightly subtherapeutic on heparin after two levels at goal; no gtt issues per RN.  Goal of Therapy:  Heparin level 0.3-0.7 units/ml   Plan:  Will increase heparin gtt slightly to 1600 units/hr and check level in 6hr.  Wynona Neat, PharmD, BCPS  11/03/2015,4:18 AM

## 2015-11-03 NOTE — Progress Notes (Signed)
[place] for heparin Indication: atrial fibrillation  Allergies  Allergen Reactions  . Diazepam Anxiety    REACTION: makes patient cry  . Vancomycin Other (See Comments)    "red man syndrome"    Patient Measurements: Height: 5\' 8"  (172.7 cm) Weight: 269 lb 2.9 oz (122.1 kg) IBW/kg (Calculated) : 68.4 Heparin Dosing Weight: 95kg  Vital Signs: Temp: 98.6 F (37 C) (08/12 1550) Temp Source: Oral (08/12 1550) BP: 105/53 (08/12 1550) Pulse Rate: 92 (08/12 1550)  Labs:  Recent Labs  11/01/15 0046 11/01/15 0631 11/01/15 0825  11/02/15 0539 11/03/15 0251 11/03/15 1154  HGB  --  11.4*  --   --  10.0* 9.3*  --   HCT  --  39.4  --   --  33.1* 32.4*  --   PLT  --  223  --   --  204 205  --   APTT 40*  --  45*  --   --   --   --   HEPARINUNFRC  --   --   --   < > 0.54 0.29* 0.27*  CREATININE  --  1.63*  --   --  0.87 0.83  --   < > = values in this interval not displayed.   Medications:  Infusions:  . sodium chloride 75 mL/hr at 11/03/15 1049  . heparin 1,600 Units/hr (11/03/15 1518)    Assessment: 30 yom presented to the ED with syncope. He is on chronic xarelto for history of afib but switched to heparin. H/H with slight trend down and platelets are WNL. No bleeding noted.   Heparin drip 1600 uts/hr HL 0.27 just below goal.   No bleeding or complications noted.  Goal of Therapy:  Heparin level 0.3-0.7 units/ml aPTT 66-102 seconds Monitor platelets by anticoagulation protocol: Yes   Plan:  Increase IV heparin 1800 uts/hr  - Daily CBC and heparin level -F/u plans to resume oral anticoagulation after AKA next week    Bonnita Nasuti Pharm.D. CPP, BCPS Clinical Pharmacist [phone] 11/03/2015 4:06 PM

## 2015-11-03 NOTE — Progress Notes (Signed)
  Progress Note    11/03/2015 8:02 AM   Subjective:  Having leg pain this a.m.  Vitals:   11/03/15 0427 11/03/15 0454  BP: (!) 103/54   Pulse:    Resp: (!) 21   Temp:  98.7 F (37.1 C)    Physical Exam: Extremities:  Dressings at foot level cdi Abdomen:  Mild distension with minimal ttp, no pulsatile masses  CBC    Component Value Date/Time   WBC 11.4 (H) 11/03/2015 0251   RBC 4.45 11/03/2015 0251   HGB 9.3 (L) 11/03/2015 0251   HCT 32.4 (L) 11/03/2015 0251   PLT 205 11/03/2015 0251   MCV 72.8 (L) 11/03/2015 0251   MCH 20.9 (L) 11/03/2015 0251   MCHC 28.7 (L) 11/03/2015 0251   RDW 23.1 (H) 11/03/2015 0251   LYMPHSABS 1.5 11/03/2015 0251   MONOABS 1.0 11/03/2015 0251   EOSABS 0.1 11/03/2015 0251   BASOSABS 0.0 11/03/2015 0251    BMET    Component Value Date/Time   NA 136 11/03/2015 0251   NA 136 (A) 07/26/2015   K 3.9 11/03/2015 0251   CL 100 (L) 11/03/2015 0251   CO2 30 11/03/2015 0251   GLUCOSE 117 (H) 11/03/2015 0251   BUN 12 11/03/2015 0251   BUN 37 (A) 07/26/2015   CREATININE 0.83 11/03/2015 0251   CREATININE 1.11 06/14/2013 1221   CALCIUM 8.2 (L) 11/03/2015 0251   GFRNONAA >60 11/03/2015 0251   GFRAA >60 11/03/2015 0251    INR    Component Value Date/Time   INR 1.39 10/19/2015 1833     Intake/Output Summary (Last 24 hours) at 11/03/15 0802 Last data filed at 11/03/15 0600  Gross per 24 hour  Intake           2092.5 ml  Output             2550 ml  Net           -457.5 ml     Assessment:  75 y.o. male is with bilateral heel ulcers  Plan: Angiogram with possible intervention early next week, likely Tuesday or Wednesday Will likely require left Above knee amputation with attempt to salvage RLE Local wound care   Savir Blanke C. Donzetta Matters, MD [facility] [place] Office: [phone] Pager: [phone]  11/03/2015 8:02 AM

## 2015-11-03 NOTE — Progress Notes (Signed)
PROGRESS NOTE    [name]  [id number] DOB: Apr 05, 1940 DOA: 10/31/2015 PCP: [name], MD   Brief Narrative:  [name] is a 75 y.o. 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    He was just discharged from our hospital 2 days ago after being treated for a COPD exacerbation and congestive heart failure exacerbation. During that time he was treated with aggressive diuresis and his diuretic medicines were adjusted on discharge. He states that on the day of admission he started feeling somewhat weak and had headache and some neck pain. He denied nausea, vomiting, diarrhea or belly pain. He then had an episode of passing out while sitting in a chair in his nursing home so he was sent to [facility] for further evaluation. Here he was noted to have leukocytosis, hypotension, and acute kidney injury. He's got chronic wounds on his legs which he says have not changed recently. He also has sacral decubitus ulcers. His wife says he has not walked in over a year. He confirms to me that he does not want aggressive resuscitation with life support or CPR.  ED Course: In the ED patient was hypotensive, given IVF resuscitation for sepsis, PCCM came to see him, initially had good BP response with IVF.  But then his EJ access infiltrated and his BP has now dropped to upper [id number] systolic.  They have now gotten another access site to allow [place] to resume IVF.  Also got zosyn and one dose of LZD for sepsis which is possibly related to his ulcers which PCCM suspects is source (especially a left leg ulcer that may have osteomyelitis associated with it).   Subjective: 8/12 A/O 4, resting comfortably.      Assessment &  Plan:   Principal Problem:   Septic shock ([place]) Active Problems:   Chronic systolic CHF (congestive heart failure) ([place])   Long term current use of anticoagulant therapy   Type II diabetes mellitus with neurological manifestations (HCC)   AKI (acute kidney injury) ([place])   Diabetic ulcer of left foot (HCC)   Diabetic ulcer of right foot (HCC)   Acute renal failure (HCC)   Gangrene (HCC)   Chronic atrial fibrillation ([place])   Uncontrolled type 2 diabetes mellitus with complication (HCC)  Septic shock/Bilateral foot Osteomyelitis vs Gangrene - Secondary to osteomyelitis/gangrene bilateral lower feet. X-ray by lateral feet nondiagnostic MRI pending. -Will consult orthopedic surgery on 8/11 for complete.  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 -Benadryl IV 25 mg, 30 minutes prior to vancomycin dose daily -8/11 Consulted vascular surgery: Dr. Donzetta Matters states will perform left AKA on Monday or Tuesday  Chronic systolic CHF -Strict in and out since admission + 2.7 L -Daily weight Filed Weights   10/31/15 2315 11/02/15 0500 11/03/15 0426  Weight: 117.7 kg (259 lb 6.4 oz) 122.3 kg (269 lb 10 oz) 122.1 kg (269 lb 2.9 oz)  -Watch  closely for fluid overloady.  HTN -Hold all BP medication  Chronic atrial fibrillation -Xarelto DC'd (preparation for surgery) -Heparin drip per pharmacy  Acute renal failure -most likely secondary to septic shock Lab Results  Component Value Date   CREATININE 0.83 11/03/2015   CREATININE 0.87 11/02/2015   CREATININE 1.63 (H) 11/01/2015   COPD -Titrate O2 to maintain SPO2 89-93%  DM Type 2  uncontrolled with complication -[id number] Hemoglobin A1c= 7.4 -Humulin 18 units BID -NovoLog 8 units QAC -Moderate SSI  Anxiety/Depression -Ativan 0.5 mg BID PRN -Hold Trazodone secondary to borderline BP   Anemia -Obtain anemia panel    DVT prophylaxis: Heparin drip Code Status: DO NOT RESUSCITATE Family Communication: None Disposition Plan: Completion of workup/orthopedic surgery recommendation   Consultants:  Dr.Brandon Dione [place] vascular surgery    Procedures/Significant Events:  7/31 echocardiogram;- Left ventricle: mild LVH. LVEF = 30% to 35%. severe hypokinesis of the inferior and inferolateral walls. -(grade 2 diastolic dysfunction).- Mitral valve: moderate regurgitation.- Pulmonary arteries: PA peak pressure: 39 mm Hg (S). 8/9 right foot x-ray:Surgical clips are identified in the region the ankle. No acute fracture or subluxation. No cortical erosion. Soft tissue swelling superficial to the calcaneus. 8/9 left foot x-ray:Significant soft tissue swelling. -Soft tissue ulceration superficial to the calcaneus.-Status post great toe amputation. 8/10 MRI right foot: Negative abscess or osteomyelitis 8/10 MRI left foot: Positive osteomyelitis--Small bone infarct lateral talus.     Cultures 8/9 Blood left/right hand NGTD 8/10 wound culture pending    Antimicrobials: Zosyn 8/9>> Vancomycin 8/9>>    Devices    LINES / TUBES:      Continuous Infusions: . sodium chloride 75 mL/hr at 11/02/15 2300  . heparin 1,600 Units/hr (11/03/15 0425)     Objective: Vitals:   11/03/15 0200 11/03/15 0426 11/03/15 0427 11/03/15 0454  BP: (!) 108/47  (!) 103/54   Pulse:      Resp: 20  (!) 21   Temp:    98.7 F (37.1 C)  TempSrc:    Oral  SpO2: 95%  95%   Weight:  122.1 kg (269 lb 2.9 oz)    Height:        Intake/Output Summary (Last 24 hours) at 11/03/15 0934 Last data filed at 11/03/15 0800  Gross per 24 hour  Intake             2172 ml  Output              2550 ml  Net             -378 ml   Filed Weights   10/31/15 2315 11/02/15 0500 11/03/15 0426  Weight: 117.7 kg (259 lb 6.4 oz) 122.3 kg (269 lb 10 oz) 122.1 kg (269 lb 2.9 oz)    Examination:  General: A/O 4, NAD, No acute respiratory distress Eyes: negative scleral hemorrhage, negative anisocoria, negative icterus ENT: Negative Runny nose, negative gingival bleeding, Neck:  Negative scars, masses, torticollis, lymphadenopathy, JVD Lungs: Clear to auscultation bilaterally without wheezes or crackles Cardiovascular: Regular rate and rhythm without murmur gallop or rub normal S1 and S2 Abdomen: Morbidly obese, negative abdominal pain, nondistended, positive soft, bowel sounds, no rebound, no ascites, no appreciable mass Extremities: left foot barely palpable DP/PT pulse, large foul-smelling gangrenous appearing ulceration of the heel with Achilles tendon visible/ulcer on dorsal surface grade 3. Right foot ulceration size of a sore dollar grade 3, poor DP/PT pulse Skin: See extremities Psychiatric:  Negative depression, negative anxiety, negative fatigue, negative mania  Central nervous system:  Cranial nerves II through XII intact, tongue/uvula midline, all extremities muscle strength 5/5, bilateral loss of sensation in feet. negative dysarthria, negative expressive aphasia, negative receptive aphasia.  .     Data Reviewed: Care during the described time interval was provided by me .  I have reviewed this patient's available data, including medical history, events of note, physical examination, and all test results as part of my evaluation. I have personally reviewed and interpreted all radiology studies.  CBC:  Recent Labs Lab 10/29/15 0328 10/31/15 1608 11/01/15 0631 11/02/15 0539 11/03/15 0251  WBC 17.1* 22.3* 16.0* 12.1* 11.4*  NEUTROABS  --   --   --  9.6* 8.8*  HGB 11.0* 13.0 11.4* 10.0* 9.3*  HCT 38.3* 43.7 39.4 33.1* 32.4*  MCV 71.7* 72.4* 72.0* 70.4* 72.8*    PLT 310 304 223 204 [id number]   Basic Metabolic Panel:  Recent Labs Lab 10/29/15 0328 10/31/15 1608 11/01/15 0631 11/01/15 1840 11/02/15 0539 11/03/15 0251  NA 132* 131* 133*  --  138 136  K 3.3* 4.4 4.0  --  4.1 3.9  CL 91* 90* 94*  --  99* 100*  CO2 34* 30 28  --  26 30  GLUCOSE 183* 141* 125*  --  121* 117*  BUN 26* 31* 35*  --  19 12  CREATININE 0.88 1.74* 1.63*  --  0.87 0.83  CALCIUM 8.5* 9.2 8.2*  --  8.5* 8.2*  MG  --   --   --  2.1 2.2 2.1   GFR: Estimated Creatinine Clearance: 96.3 mL/min (by C-G formula based on SCr of 0.83 mg/dL). Liver Function Tests:  Recent Labs Lab 11/01/15 0046 11/01/15 0631 11/02/15 0539 11/03/15 0251  AST 17 18 15  13*  ALT 18 16* 13* 13*  ALKPHOS 67 61 52 48  BILITOT 1.2 1.0 0.8 0.6  PROT 6.5 5.9* 6.1* 5.5*  ALBUMIN 3.3* 2.9* 3.2* 3.0*   No results for input(s): LIPASE, AMYLASE in the last 168 hours. No results for input(s): AMMONIA in the last 168 hours. Coagulation Profile: No results for input(s): INR, PROTIME in the last 168 hours. Cardiac Enzymes: No results for input(s): CKTOTAL, CKMB, CKMBINDEX, TROPONINI in the last 168 hours. BNP (last 3 results) No results for input(s): PROBNP in the last 8760 hours. HbA1C:  Recent Labs  11/01/15 1134  HGBA1C 6.4*   CBG:  Recent Labs Lab 11/02/15 1232 11/02/15 1608 11/02/15 2148 11/03/15 0459 11/03/15 0512  GLUCAP 141* 144* 102* 111* 116*   Lipid Profile:  Recent Labs  11/01/15 1134  CHOL 109  HDL 34*  LDLCALC 58  TRIG 84  CHOLHDL 3.2   Thyroid Function Tests: No results for input(s): TSH, T4TOTAL, FREET4, T3FREE, THYROIDAB in the last 72 hours. Anemia Panel: No results for input(s): VITAMINB12, FOLATE, FERRITIN, TIBC, IRON, RETICCTPCT in the last 72 hours. Urine analysis:    Component Value Date/Time   COLORURINE YELLOW 10/31/2015 1608   APPEARANCEUR CLOUDY (A) 10/31/2015 1608   LABSPEC 1.012 10/31/2015 1608   PHURINE 8.5 (H) 10/31/2015 1608   GLUCOSEU  NEGATIVE 10/31/2015 1608   HGBUR NEGATIVE 10/31/2015 1608   HGBUR negative 03/06/2010 1419   BILIRUBINUR NEGATIVE 10/31/2015 1608   KETONESUR NEGATIVE 10/31/2015 1608   PROTEINUR NEGATIVE 10/31/2015 1608   UROBILINOGEN 0.2 09/13/2014 0945   NITRITE NEGATIVE 10/31/2015 1608   LEUKOCYTESUR SMALL (A) 10/31/2015 1608   Sepsis Labs: @LABRCNTIP (procalcitonin:4,lacticidven:4)  ) Recent Results (from the past 240 hour(s))  Blood Culture (  routine x 2)     Status: None (Preliminary result)   Collection Time: 10/31/15  6:15 PM  Result Value Ref Range Status   Specimen Description BLOOD LEFT HAND  Final   Special Requests BOTTLES DRAWN AEROBIC AND ANAEROBIC 5ML   Final   Culture NO GROWTH 2 DAYS  Final   Report Status PENDING  Incomplete  Blood Culture (routine x 2)     Status: None (Preliminary result)   Collection Time: 10/31/15  6:30 PM  Result Value Ref Range Status   Specimen Description BLOOD RIGHT HAND  Final   Special Requests BOTTLES DRAWN AEROBIC ONLY 5ML  Final   Culture NO GROWTH 2 DAYS  Final   Report Status PENDING  Incomplete         Radiology Studies: Mr Foot Right Wo Contrast  Result Date: 11/02/2015 CLINICAL DATA:  Soft tissue wound on the heel. EXAM: MRI OF THE RIGHT HIND IN FOOT WITHOUT CONTRAST TECHNIQUE: Multiplanar, multisequence MR imaging was performed. No intravenous contrast was administered. COMPARISON:  Radiographs dated 10/31/2015 FINDINGS: There is a superficial soft tissue ulceration on the posterior aspect of the heel approximately 5 mm deep. There is slight edema in the adjacent subcutaneous fat. There is no abscess or osteomyelitis. No joint effusions. The achilles tendon at the tendons at the medial, lateral and anterior aspects of the ankle and hindfoot appear normal. IMPRESSION: Superficial soft tissue ulceration of the heel. No underlying abscess or osteomyelitis. Electronically Signed   By: Lorriane Shire M.D.   On: 11/02/2015 08:13   Mr Foot Left  Wo Contrast  Result Date: 11/02/2015 CLINICAL DATA:  Diabetic patient with bilateral heel wounds. Septic shock. No known injury. Initial encounter. EXAM: MRI OF THE LEFT FOOT WITHOUT CONTRAST TECHNIQUE: Multiplanar, multisequence MR imaging was performed. No intravenous contrast was administered. COMPARISON:  Plain films left foot 10/31/2015. FINDINGS: A large skin ulceration seen over the dorsal margin of the calcaneus. Marrow edema is seen in the posterior aspect of the calcaneus most notable in the dorsal process and lateral calcaneus. No soft tissue abscess is identified. Mild edema is seen in the distal Achilles tendon eccentric with the medial side. Major ligaments and tendons about the ankle are intact. No joint effusion is identified. A bone small infarct is seen in the lateral talar without fragmentation or collapse. Mild degenerative change at the calcaneocuboid joint is noted. IMPRESSION: Large skin wound over the heel with marrow edema in the posterior calcaneus subjacent to the wound consistent osteomyelitis. Negative for abscess or septic joint. Small bone infarct lateral talus. Electronically Signed   By: Inge Rise M.D.   On: 11/02/2015 08:16        Scheduled Meds: . antiseptic oral rinse  7 mL Mouth Rinse BID  . antiseptic oral rinse  7 mL Mouth Rinse BID  . atorvastatin  40 mg Oral q1800  . collagenase   Topical Daily  . diphenhydrAMINE  25 mg Intravenous Q12H  . docusate sodium  100 mg Oral BID  . DULoxetine  90 mg Oral Daily  . feeding supplement (GLUCERNA SHAKE)  237 mL Oral Q24H  . ferrous sulfate  325 mg Oral Q breakfast  . gabapentin  400 mg Oral TID  . insulin aspart  0-15 Units Subcutaneous TID WC  . insulin aspart  8 Units Subcutaneous TID WC  . insulin NPH Human  18 Units Subcutaneous BID AC & HS  . lidocaine  1 patch Transdermal Q24H  . lubiprostone  8 mcg Oral BID WC  . pantoprazole  80 mg Oral Daily  . piperacillin-tazobactam (ZOSYN)  IV  3.375 g  Intravenous Q8H  . polyvinyl alcohol  1 drop Both Eyes BID  . sodium chloride flush  3 mL Intravenous Q12H  . tamsulosin  0.4 mg Oral QPC supper  . traZODone  100 mg Oral QHS  . vancomycin  1,250 mg Intravenous Q12H   Continuous Infusions: . sodium chloride 75 mL/hr at 11/02/15 2300  . heparin 1,600 Units/hr (11/03/15 0425)     LOS: 3 days    Time spent: 40 minutes    WOODS, Geraldo Docker, MD [facility] Pager [phone]   If 7PM-7AM, please contact night-coverage [email or web address] Password [facility] 11/03/2015, 9:34 AM

## 2015-11-04 DIAGNOSIS — J431 Panlobular emphysema: Secondary | ICD-10-CM

## 2015-11-04 LAB — COMPREHENSIVE METABOLIC PANEL
ALBUMIN: 2.8 g/dL — AB (ref 3.5–5.0)
ALT: 12 U/L — AB (ref 17–63)
AST: 13 U/L — AB (ref 15–41)
Alkaline Phosphatase: 45 U/L (ref 38–126)
Anion gap: 11 (ref 5–15)
BUN: 7 mg/dL (ref 6–20)
CHLORIDE: 101 mmol/L (ref 101–111)
CO2: 26 mmol/L (ref 22–32)
CREATININE: 0.7 mg/dL (ref 0.61–1.24)
Calcium: 8.3 mg/dL — ABNORMAL LOW (ref 8.9–10.3)
GFR calc Af Amer: >60 mL/min (ref >60)
GFR calc non Af Amer: >60 mL/min (ref >60)
Glucose, Bld: 97 mg/dL (ref 65–99)
POTASSIUM: 3.7 mmol/L (ref 3.5–5.1)
SODIUM: 138 mmol/L (ref 135–145)
Total Bilirubin: 0.7 mg/dL (ref 0.3–1.2)
Total Protein: 5.2 g/dL — ABNORMAL LOW (ref 6.5–8.1)

## 2015-11-04 LAB — CBC WITH DIFFERENTIAL/PLATELET
Basophils Absolute: 0 10*3/uL (ref 0.0–0.1)
Basophils Relative: 0 %
EOS PCT: 2 %
Eosinophils Absolute: 0.2 10*3/uL (ref 0.0–0.7)
HEMATOCRIT: 34 % — AB (ref 39.0–52.0)
HEMOGLOBIN: 9.4 g/dL — AB (ref 13.0–17.0)
LYMPHS ABS: 1.3 10*3/uL (ref 0.7–4.0)
LYMPHS PCT: 16 %
MCH: 20.7 pg — ABNORMAL LOW (ref 26.0–34.0)
MCHC: 27.6 g/dL — ABNORMAL LOW (ref 30.0–36.0)
MCV: 74.7 fL — AB (ref 78.0–100.0)
MONOS PCT: 10 %
Monocytes Absolute: 0.8 10*3/uL (ref 0.1–1.0)
NEUTROS ABS: 5.7 10*3/uL (ref 1.7–7.7)
Neutrophils Relative %: 72 %
PLATELETS: 158 10*3/uL (ref 150–400)
RBC: 4.55 MIL/uL (ref 4.22–5.81)
RDW: 23.3 % — ABNORMAL HIGH (ref 11.5–15.5)
WBC: 8 10*3/uL (ref 4.0–10.5)

## 2015-11-04 LAB — GLUCOSE, CAPILLARY
GLUCOSE-CAPILLARY: 98 mg/dL (ref 65–99)
GLUCOSE-CAPILLARY: 156 mg/dL — AB (ref 65–99)
Glucose-Capillary: 195 mg/dL — ABNORMAL HIGH (ref 65–99)
Glucose-Capillary: 133 mg/dL — ABNORMAL HIGH (ref 65–99)

## 2015-11-04 LAB — MAGNESIUM: Magnesium: 1.9 mg/dL (ref 1.7–2.4)

## 2015-11-04 LAB — HEPARIN LEVEL (UNFRACTIONATED): HEPARIN UNFRACTIONATED: 0.39 [IU]/mL (ref 0.30–0.70)

## 2015-11-04 LAB — VANCOMYCIN, TROUGH: VANCOMYCIN TR: 18 ug/mL (ref 15–20)

## 2015-11-04 LAB — HAPTOGLOBIN: Haptoglobin: 210 mg/dL — ABNORMAL HIGH (ref 34–200)

## 2015-11-04 NOTE — Progress Notes (Signed)
[place] for heparin Indication: atrial fibrillation  Allergies  Allergen Reactions  . Diazepam Anxiety    REACTION: makes patient cry  . Vancomycin Other (See Comments)    "red man syndrome"    Patient Measurements: Height: 5\' 8"  (172.7 cm) Weight: 265 lb 6.9 oz (120.4 kg) IBW/kg (Calculated) : 68.4 Heparin Dosing Weight: 95kg  Vital Signs: Temp: 97.4 F (36.3 C) (08/13 0801) Temp Source: Oral (08/13 0801) BP: 124/61 (08/13 0801) Pulse Rate: 88 (08/13 0801)  Labs:  Recent Labs  11/02/15 0539 11/03/15 0251 11/03/15 1154 11/04/15 0253  HGB 10.0* 9.3*  --  9.4*  HCT 33.1* 32.4*  --  34.0*  PLT 204 205  --  158  HEPARINUNFRC 0.54 0.29* 0.27* 0.39  CREATININE 0.87 0.83  --  0.70     Medications:  Infusions:  . sodium chloride 75 mL/hr at 11/04/15 0453  . heparin 1,800 Units/hr (11/04/15 0538)    Assessment: 19 yom presented to the ED with syncope. He is on chronic xarelto for history of afib but switched to heparin on admission for procedures. H/H with slight trend down and platelets are WNL. No bleeding noted.   Heparin drip 1800 uts/hr HL 0.39 at goal.   No bleeding or complications noted.  Goal of Therapy:  Heparin level 0.3-0.7 units/ml aPTT 66-102 seconds Monitor platelets by anticoagulation protocol: Yes   Plan:  Continue  IV heparin 1800 uts/hr  - Daily CBC and heparin level -F/u plans to resume oral anticoagulation after AKA next week    Bonnita Nasuti Pharm.D. CPP, BCPS Clinical Pharmacist [phone] 11/04/2015 10:31 AM

## 2015-11-04 NOTE — Progress Notes (Signed)
Pharmacy Antibiotic Note  [name] is a 76 y.o. male admitted on 10/31/2015 with LE ulcers and   osteomyelitis will now require AKA .  Pharmacy has been consulted for vancomycin and zosyn dosing. Pt is afebrile and WBC is improving 12>wnl.  Scr rapidly falling 1.7>0.7.  VT drawn today = 18  All doses charted.    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) - Zosyn 3.375gm IV Q8H EI - F/u renal fxn, C&S, clinical status and trough at SS  Height: 5\' 8"  (172.7 cm) Weight: 265 lb 6.9 oz (120.4 kg) IBW/kg (Calculated) : 68.4  Temp (24hrs), Avg:98 F (36.7 C), Min:97.3 F (36.3 C), Max:98.7 F (37.1 C)   Recent Labs Lab 10/31/15 1608 10/31/15 1652 10/31/15 1915 11/01/15 0631 11/01/15 1134 11/01/15 1229 11/02/15 0539 11/02/15 0541 11/03/15 0251 11/04/15 0253 11/04/15 1158  WBC 22.3*  --   --  16.0*  --   --  12.1*  --  11.4* 8.0  --   CREATININE 1.74*  --   --  1.63*  --   --  0.87  --  0.83 0.70  --   LATICACIDVEN  --  3.16* 3.61*  --  1.5 2.2*  --  0.7  --   --   --   VANCOTROUGH  --   --   --   --   --   --   --   --   --   --  18    Estimated Creatinine Clearance: 99.1 mL/min (by C-G formula based on SCr of 0.8 mg/dL).    Allergies  Allergen Reactions  . Diazepam Anxiety    REACTION: makes patient cry  . Vancomycin Other (See Comments)    "red man syndrome"    Antimicrobials this admission: Vanc 8/9>> Zosyn 8/9>> Linezolid x 1 8/9  Dose adjustments this admission: 8/11 > increase vancomycin to 1250 mg q 12 hrs  Microbiology results: 8/9 BCx: ngtd    Bonnita Nasuti Pharm.D. CPP, BCPS Clinical Pharmacist [phone] 11/04/2015 2:08 PM

## 2015-11-04 NOTE — Progress Notes (Signed)
  Progress Note    11/04/2015 11:38 AM * No surgery date entered *  Subjective:  No complaints today  Vitals:   11/04/15 0400 11/04/15 0801  BP: (!) 107/46 124/61  Pulse:  88  Resp: (!) 21 15  Temp:  97.4 F (36.3 C)    Physical Exam: Extremities:  Dressings at foot level cdi Palpable R femoral pulse Abdomen:  no pulsatile masses   CBC    Component Value Date/Time   WBC 8.0 11/04/2015 0253   RBC 4.55 11/04/2015 0253   HGB 9.4 (L) 11/04/2015 0253   HCT 34.0 (L) 11/04/2015 0253   PLT 158 11/04/2015 0253   MCV 74.7 (L) 11/04/2015 0253   MCH 20.7 (L) 11/04/2015 0253   MCHC 27.6 (L) 11/04/2015 0253   RDW 23.3 (H) 11/04/2015 0253   LYMPHSABS 1.3 11/04/2015 0253   MONOABS 0.8 11/04/2015 0253   EOSABS 0.2 11/04/2015 0253   BASOSABS 0.0 11/04/2015 0253    BMET    Component Value Date/Time   NA 138 11/04/2015 0253   NA 136 (A) 07/26/2015   K 3.7 11/04/2015 0253   CL 101 11/04/2015 0253   CO2 26 11/04/2015 0253   GLUCOSE 97 11/04/2015 0253   BUN 7 11/04/2015 0253   BUN 37 (A) 07/26/2015   CREATININE 0.70 11/04/2015 0253   CREATININE 1.11 06/14/2013 1221   CALCIUM 8.3 (L) 11/04/2015 0253   GFRNONAA >60 11/04/2015 0253   GFRAA >60 11/04/2015 0253    INR    Component Value Date/Time   INR 1.39 10/19/2015 1833     Intake/Output Summary (Last 24 hours) at 11/04/15 1138 Last data filed at 11/04/15 0020  Gross per 24 hour  Intake              900 ml  Output             1476 ml  Net             -576 ml     Assessment:  75 y.o. male is here with bilateral heel ulcers, recovering from sepsis on abx.   Plan: Angiogram with possible intervention early next week, likely Tuesday or Wednesday Will likely require left Above knee amputation with attempt to salvage RLE. Need to discuss this with family as patient seems to be unaware of condition prior to admission. Local wound care   Deklyn Gibbon C. Donzetta Matters, MD [facility] Office:  [phone] Pager: [phone]  11/04/2015 11:38 AM

## 2015-11-04 NOTE — Progress Notes (Signed)
PROGRESS NOTE    [name]  [id number] DOB: [birth year] DOA: 10/31/2015 PCP: [name], MD   Brief Narrative:  [name] is a 75 y.o. 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    He was just discharged from our hospital 2 days ago after being treated for a COPD exacerbation and congestive heart failure exacerbation. During that time he was treated with aggressive diuresis and his diuretic medicines were adjusted on discharge. He states that on the day of admission he started feeling somewhat weak and had headache and some neck pain. He denied nausea, vomiting, diarrhea or belly pain. He then had an episode of passing out while sitting in a chair in his nursing home so he was sent to [facility] for further evaluation. Here he was noted to have leukocytosis, hypotension, and acute kidney injury. He's got chronic wounds on his legs which he says have not changed recently. He also has sacral decubitus ulcers. His wife says he has not walked in over a year. He confirms to me that he does not want aggressive resuscitation with life support or CPR.  ED Course: In the ED patient was hypotensive, given IVF resuscitation for sepsis, PCCM came to see him, initially had good BP response with IVF.  But then his EJ access infiltrated and his BP has now dropped to upper [id number] systolic.  They have now gotten another access site to allow [place] to resume IVF.  Also got zosyn and one dose of LZD for sepsis which is possibly related to his ulcers which PCCM suspects is source (especially a left leg ulcer that may have osteomyelitis associated with it).   Subjective: 8/13 A/O 4, resting comfortably, but anxious concerning  upcoming surgery.      Assessment & Plan:   Principal Problem:   Septic shock ([place]) Active Problems:   Chronic systolic CHF (congestive heart failure) ([place])   Long term current use of anticoagulant therapy   Type II diabetes mellitus with neurological manifestations (HCC)   AKI (acute kidney injury) ([place])   Diabetic ulcer of left foot (HCC)   Ulcer of foot due to diabetes ([place])   Acute renal failure (HCC)   Gangrene (HCC)   Chronic atrial fibrillation ([place])   Uncontrolled type 2 diabetes mellitus with complication (HCC)   Chronic osteomyelitis of left foot (HCC)   Panlobular emphysema (HCC)  Septic shock/Bilateral foot Osteomyelitis vs Gangrene - Secondary to osteomyelitis/gangrene bilateral lower feet. X-ray by lateral feet nondiagnostic MRI pending. -Will consult orthopedic surgery on 8/11 for complete.  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 -Benadryl IV 25 mg, 30 minutes prior to vancomycin dose daily -8/11 Consulted vascular surgery: Dr. Donzetta Matters states will perform arteriogram Tuesday, left AKA on Wednesday or Thursday  Chronic systolic CHF -Strict in and out since admission + 2.2 L -Daily weight Filed Weights   11/02/15 0500 11/03/15 0426 11/04/15 0300  Weight: 122.3 kg (269 lb 10 oz) 122.1 kg (269 lb 2.9 oz) 120.4 kg (265 lb 6.9 oz)  -Watch closely for fluid overloady.  HTN -Hold all BP medication  Chronic atrial fibrillation -Xarelto DC'd (preparation for surgery) -Heparin drip per pharmacy  Acute renal failure -most likely secondary to septic shock Lab Results  Component Value Date    CREATININE 0.70 11/04/2015   CREATININE 0.83 11/03/2015   CREATININE 0.87 11/02/2015  -Resolved  COPD -Titrate O2 to maintain SPO2 89-93%  DM Type 2 uncontrolled with complication -[id number] Hemoglobin A1c= 7.4 -Humulin 18 units BID -NovoLog 8 units QAC -Moderate SSI  Anxiety/Depression -Ativan 0.5 mg BID PRN -Trazodone 100 mg QHS   Anemia -Obtain anemia panel    DVT prophylaxis: Heparin drip Code Status: DO NOT RESUSCITATE Family Communication: None Disposition Plan: Completion of workup/orthopedic surgery recommendation   Consultants:  Dr.Brandon Dione [place] vascular surgery    Procedures/Significant Events:  7/31 echocardiogram;- Left ventricle: mild LVH. LVEF = 30% to 35%. severe hypokinesis of the inferior and inferolateral walls. -(grade 2 diastolic dysfunction).- Mitral valve: moderate regurgitation.- Pulmonary arteries: PA peak pressure: 39 mm Hg (S). 8/9 right foot x-ray:Surgical clips are identified in the region the ankle. No acute fracture or subluxation. No cortical erosion. Soft tissue swelling superficial to the calcaneus. 8/9 left foot x-ray:Significant soft tissue swelling. -Soft tissue ulceration superficial to the calcaneus.-Status post great toe amputation. 8/10 MRI right foot: Negative abscess or osteomyelitis 8/10 MRI left foot: Positive osteomyelitis--Small bone infarct lateral talus.     Cultures 8/9 Blood left/right hand NGTD 8/10 wound culture pending    Antimicrobials: Zosyn 8/9>> Vancomycin 8/9>>    Devices    LINES / TUBES:      Continuous Infusions: . sodium chloride 75 mL/hr at 11/04/15 1827  . heparin 1,800 Units/hr (11/04/15 1827)     Objective: Vitals:   11/04/15 0400 11/04/15 0801 11/04/15 1203 11/04/15 1600  BP: (!) 107/46 124/61 110/60 112/63  Pulse:  88 90   Resp: (!) 21 15 16 19   Temp:  97.4 F (36.3 C) 97.3 F (36.3 C) 97.8 F (36.6 C)  TempSrc:  Oral Axillary Oral  SpO2: 98% 97% 100% 98%    Weight:      Height:        Intake/Output Summary (Last 24 hours) at 11/04/15 1940 Last data filed at 11/04/15 1800  Gross per 24 hour  Intake             2001 ml  Output             2350 ml  Net             -349 ml   Filed Weights   11/02/15 0500 11/03/15 0426 11/04/15 0300  Weight: 122.3 kg (269 lb 10 oz) 122.1 kg (269 lb 2.9 oz) 120.4 kg (265 lb 6.9 oz)    Examination:  General: A/O 4, NAD, No acute respiratory distress Eyes: negative scleral hemorrhage, negative anisocoria, negative icterus ENT: Negative Runny nose, negative gingival bleeding, Neck:  Negative scars, masses, torticollis, lymphadenopathy, JVD Lungs: Clear to auscultation bilaterally without wheezes or crackles Cardiovascular: Regular rate and rhythm without murmur gallop or rub normal S1 and S2 Abdomen: Morbidly obese, negative abdominal pain, nondistended, positive soft, bowel sounds, no rebound, no ascites, no appreciable mass Extremities: left foot barely palpable DP/PT pulse, large foul-smelling gangrenous appearing ulceration of the heel with Achilles tendon visible/ulcer on dorsal surface grade 3. Right foot ulceration size of a  sore dollar grade 3, poor DP/PT pulse Skin: See extremities Psychiatric:  Negative depression, negative anxiety, negative fatigue, negative mania  Central nervous system:  Cranial nerves II through XII intact, tongue/uvula midline, all extremities muscle strength 5/5, bilateral loss of sensation in feet. negative dysarthria, negative expressive aphasia, negative receptive aphasia.  .     Data Reviewed: Care during the described time interval was provided by me .  I have reviewed this patient's available data, including medical history, events of note, physical examination, and all test results as part of my evaluation. I have personally reviewed and interpreted all radiology studies.  CBC:  Recent Labs Lab 10/31/15 1608 11/01/15 0631 11/02/15 0539 11/03/15 0251  11/04/15 0253  WBC 22.3* 16.0* 12.1* 11.4* 8.0  NEUTROABS  --   --  9.6* 8.8* 5.7  HGB 13.0 11.4* 10.0* 9.3* 9.4*  HCT 43.7 39.4 33.1* 32.4* 34.0*  MCV 72.4* 72.0* 70.4* 72.8* 74.7*  PLT 304 223 204 205 0000000   Basic Metabolic Panel:  Recent Labs Lab 10/31/15 1608 11/01/15 0631 11/01/15 1840 11/02/15 0539 11/03/15 0251 11/04/15 0253  NA 131* 133*  --  138 136 138  K 4.4 4.0  --  4.1 3.9 3.7  CL 90* 94*  --  99* 100* 101  CO2 30 28  --  26 30 26   GLUCOSE 141* 125*  --  121* 117* 97  BUN 31* 35*  --  19 12 7   CREATININE 1.74* 1.63*  --  0.87 0.83 0.70  CALCIUM 9.2 8.2*  --  8.5* 8.2* 8.3*  MG  --   --  2.1 2.2 2.1 1.9   GFR: Estimated Creatinine Clearance: 99.1 mL/min (by C-G formula based on SCr of 0.8 mg/dL). Liver Function Tests:  Recent Labs Lab 11/01/15 0046 11/01/15 0631 11/02/15 0539 11/03/15 0251 11/04/15 0253  AST 17 18 15  13* 13*  ALT 18 16* 13* 13* 12*  ALKPHOS 67 61 52 48 45  BILITOT 1.2 1.0 0.8 0.6 0.7  PROT 6.5 5.9* 6.1* 5.5* 5.2*  ALBUMIN 3.3* 2.9* 3.2* 3.0* 2.8*   No results for input(s): LIPASE, AMYLASE in the last 168 hours. No results for input(s): AMMONIA in the last 168 hours. Coagulation Profile: No results for input(s): INR, PROTIME in the last 168 hours. Cardiac Enzymes: No results for input(s): CKTOTAL, CKMB, CKMBINDEX, TROPONINI in the last 168 hours. BNP (last 3 results) No results for input(s): PROBNP in the last 8760 hours. HbA1C: No results for input(s): HGBA1C in the last 72 hours. CBG:  Recent Labs Lab 11/03/15 1548 11/03/15 2126 11/04/15 0805 11/04/15 1207 11/04/15 1626  GLUCAP 110* 105* 98 195* 156*   Lipid Profile: No results for input(s): CHOL, HDL, LDLCALC, TRIG, CHOLHDL, LDLDIRECT in the last 72 hours. Thyroid Function Tests: No results for input(s): TSH, T4TOTAL, FREET4, T3FREE, THYROIDAB in the last 72 hours. Anemia Panel:  Recent Labs  11/03/15 1015  VITAMINB12 247  FOLATE 19.9  FERRITIN 127  TIBC  242*  IRON 36*  RETICCTPCT 1.0   Urine analysis:    Component Value Date/Time   COLORURINE YELLOW 10/31/2015 1608   APPEARANCEUR CLOUDY (A) 10/31/2015 1608   LABSPEC 1.012 10/31/2015 1608   PHURINE 8.5 (H) 10/31/2015 1608   GLUCOSEU NEGATIVE 10/31/2015 1608   HGBUR NEGATIVE 10/31/2015 1608   HGBUR negative 03/06/2010 1419   BILIRUBINUR NEGATIVE 10/31/2015 1608   KETONESUR NEGATIVE 10/31/2015 1608   PROTEINUR NEGATIVE 10/31/2015 1608   UROBILINOGEN 0.2 09/13/2014 0945   NITRITE NEGATIVE  10/31/2015 1608   LEUKOCYTESUR SMALL (A) 10/31/2015 1608   Sepsis Labs: @LABRCNTIP (procalcitonin:4,lacticidven:4)  ) Recent Results (from the past 240 hour(s))  Blood Culture (routine x 2)     Status: None (Preliminary result)   Collection Time: 10/31/15  6:15 PM  Result Value Ref Range Status   Specimen Description BLOOD LEFT HAND  Final   Special Requests BOTTLES DRAWN AEROBIC AND ANAEROBIC 5ML   Final   Culture NO GROWTH 4 DAYS  Final   Report Status PENDING  Incomplete  Blood Culture (routine x 2)     Status: None (Preliminary result)   Collection Time: 10/31/15  6:30 PM  Result Value Ref Range Status   Specimen Description BLOOD RIGHT HAND  Final   Special Requests BOTTLES DRAWN AEROBIC ONLY 5ML  Final   Culture NO GROWTH 4 DAYS  Final   Report Status PENDING  Incomplete         Radiology Studies: No results found.      Scheduled Meds: . antiseptic oral rinse  7 mL Mouth Rinse BID  . antiseptic oral rinse  7 mL Mouth Rinse BID  . atorvastatin  40 mg Oral q1800  . collagenase   Topical Daily  . diphenhydrAMINE  25 mg Intravenous Q12H  . docusate sodium  100 mg Oral BID  . DULoxetine  90 mg Oral Daily  . feeding supplement (GLUCERNA SHAKE)  237 mL Oral Q24H  . ferrous sulfate  325 mg Oral Q breakfast  . gabapentin  400 mg Oral TID  . insulin aspart  0-15 Units Subcutaneous TID WC  . insulin aspart  8 Units Subcutaneous TID WC  . insulin NPH Human  18 Units  Subcutaneous BID AC & HS  . lidocaine  1 patch Transdermal Q24H  . lubiprostone  8 mcg Oral BID WC  . pantoprazole  80 mg Oral Daily  . piperacillin-tazobactam (ZOSYN)  IV  3.375 g Intravenous Q8H  . polyvinyl alcohol  1 drop Both Eyes BID  . sodium chloride flush  3 mL Intravenous Q12H  . tamsulosin  0.4 mg Oral QPC supper  . traZODone  100 mg Oral QHS  . vancomycin  1,250 mg Intravenous Q12H   Continuous Infusions: . sodium chloride 75 mL/hr at 11/04/15 1827  . heparin 1,800 Units/hr (11/04/15 1827)     LOS: 4 days    Time spent: 40 minutes    Valree Feild, Geraldo Docker, MD [facility] Pager [phone]   If 7PM-7AM, please contact night-coverage [email or web address] Password [facility] 11/04/2015, 7:40 PM

## 2015-11-05 ENCOUNTER — Encounter: Payer: Self-pay | Admitting: Adult Health

## 2015-11-05 LAB — COMPREHENSIVE METABOLIC PANEL
ALBUMIN: 2.9 g/dL — AB (ref 3.5–5.0)
ALT: 15 U/L — AB (ref 17–63)
AST: 20 U/L (ref 15–41)
Alkaline Phosphatase: 48 U/L (ref 38–126)
Anion gap: 7 (ref 5–15)
CHLORIDE: 106 mmol/L (ref 101–111)
CO2: 25 mmol/L (ref 22–32)
Calcium: 8.3 mg/dL — ABNORMAL LOW (ref 8.9–10.3)
Creatinine, Ser: 0.65 mg/dL (ref 0.61–1.24)
GFR calc Af Amer: >60 mL/min (ref >60)
Glucose, Bld: 120 mg/dL — ABNORMAL HIGH (ref 65–99)
POTASSIUM: 4.3 mmol/L (ref 3.5–5.1)
SODIUM: 138 mmol/L (ref 135–145)
Total Bilirubin: 0.6 mg/dL (ref 0.3–1.2)
Total Protein: 5.4 g/dL — ABNORMAL LOW (ref 6.5–8.1)

## 2015-11-05 LAB — CBC WITH DIFFERENTIAL/PLATELET
BASOS PCT: 0 %
Basophils Absolute: 0 10*3/uL (ref 0.0–0.1)
EOS PCT: 2 %
Eosinophils Absolute: 0.1 10*3/uL (ref 0.0–0.7)
HEMATOCRIT: 35.2 % — AB (ref 39.0–52.0)
Hemoglobin: 10.5 g/dL — ABNORMAL LOW (ref 13.0–17.0)
LYMPHS PCT: 14 %
Lymphs Abs: 0.8 10*3/uL (ref 0.7–4.0)
MCH: 21.8 pg — AB (ref 26.0–34.0)
MCHC: 29.8 g/dL — ABNORMAL LOW (ref 30.0–36.0)
MCV: 73 fL — AB (ref 78.0–100.0)
MONOS PCT: 7 %
Monocytes Absolute: 0.4 10*3/uL (ref 0.1–1.0)
Neutro Abs: 4.7 10*3/uL (ref 1.7–7.7)
Neutrophils Relative %: 77 %
Platelets: 116 10*3/uL — ABNORMAL LOW (ref 150–400)
RBC: 4.82 MIL/uL (ref 4.22–5.81)
RDW: 23.5 % — ABNORMAL HIGH (ref 11.5–15.5)
WBC: 6 10*3/uL (ref 4.0–10.5)

## 2015-11-05 LAB — GLUCOSE, CAPILLARY
Glucose-Capillary: 117 mg/dL — ABNORMAL HIGH (ref 65–99)
Glucose-Capillary: 176 mg/dL — ABNORMAL HIGH (ref 65–99)
Glucose-Capillary: 134 mg/dL — ABNORMAL HIGH (ref 65–99)
Glucose-Capillary: 259 mg/dL — ABNORMAL HIGH (ref 65–99)

## 2015-11-05 LAB — MAGNESIUM: MAGNESIUM: 1.9 mg/dL (ref 1.7–2.4)

## 2015-11-05 LAB — CULTURE, BLOOD (ROUTINE X 2)
Culture: NO GROWTH
Culture: NO GROWTH

## 2015-11-05 MED ORDER — SODIUM CHLORIDE 0.9% FLUSH
10.0000 mL | INTRAVENOUS | Status: DC | PRN
  Administered 2015-11-07 – 2015-11-09 (×4): 10 mL
  Administered 2015-11-10: 20 mL
  Filled 2015-11-05 (×5): qty 40

## 2015-11-05 MED ORDER — ASPIRIN 325 MG PO TABS: 325.0000 mg | ORAL_TABLET | Freq: Every day | ORAL | Status: DC

## 2015-11-05 MED ORDER — SODIUM CHLORIDE 0.9% FLUSH: 10.0000 mL | Freq: Two times a day (BID) | INTRAVENOUS | Status: DC

## 2015-11-05 MED ORDER — FUROSEMIDE 80 MG PO TABS
80.0000 mg | ORAL_TABLET | Freq: Two times a day (BID) | ORAL | Status: DC
  Administered 2015-11-05 – 2015-11-10 (×9): 80 mg via ORAL
  Filled 2015-11-05 (×9): qty 1

## 2015-11-05 MED ORDER — ASPIRIN EC 81 MG PO TBEC
81.0000 mg | DELAYED_RELEASE_TABLET | Freq: Every day | ORAL | Status: DC
  Administered 2015-11-05 – 2015-11-10 (×5): 81 mg via ORAL
  Filled 2015-11-05 (×5): qty 1

## 2015-11-05 MED ORDER — CYANOCOBALAMIN 1000 MCG/ML IJ SOLN
1000.0000 ug | Freq: Every day | INTRAMUSCULAR | Status: AC
  Administered 2015-11-05 – 2015-11-07 (×2): 1000 ug via SUBCUTANEOUS
  Filled 2015-11-05 (×3): qty 1

## 2015-11-05 NOTE — Progress Notes (Signed)
Peripherally Inserted Central Catheter/Midline Placement  The IV Nurse has discussed with the patient and/or persons authorized to consent for the patient, the purpose of this procedure and the potential benefits and risks involved with this procedure.  The benefits include less needle sticks, lab draws from the catheter and patient may be discharged home with the catheter.  Risks include, but not limited to, infection, bleeding, blood clot (thrombus formation), and puncture of an artery; nerve damage and irregular heat beat.  Alternatives to this procedure were also discussed.  PICC/Midline Placement Documentation        Jemma Rasp, Nicolette Bang 11/05/2015, 3:49 PM

## 2015-11-05 NOTE — Progress Notes (Addendum)
[place] TEAM 1 - Stepdown/ICU TEAM  CASTLE DOUGALL  [id number] DOB: 02-26-1941 DOA: 10/31/2015 PCP: [name], MD    Brief Narrative:  75 y.o.M Hx Depression, Anxiety, Neuromuscular disorder, CVA,  Morbid obesity, DM2, Chronic Systolic CHF, Chronic Atrial Fibrillation, CAD, HTN, HLD, COPD on 1.5 L O2 PRN, OSA, MRSA bacteremia in 2011 w/ Osteomyelitis of the sternoclavicular joint, Frequent Falls, and Chronic Back pain who was just discharged 2 days prior to this admit after being treated for a COPD exacerbation and congestive heart failure exacerbation. During that time he was treated with aggressive diuresis and his diuretics were adjusted on discharge. On the day of his readmission he started feeling weak w/ a headache and neck pain. He then passed out while sitting in a chair in his nursing home so he was sent to [facility].   In the ED he was noted to have leukocytosis, hypotension, and acute kidney injury, as well as chronic wounds on his legs and sacral decubitus ulcers. The patient was hypotensive in the ED and required volume resuscitation.  Subjective: The pt states he is hungry, and is cold.  He denies cp, sob, n/v, or abdom pain.  He does report intermittent cramps affecting B LE.    Assessment & Plan:  Septic shock / Bilateral foot Osteomyelitis vs Gangrene -Vascular Surgery to perform arteriogram Tuesday top determine if R foot salvageable - L AKA on Wednesday or Thursday  Chronic combined systolic and diastolic CHF -EF [id number] via TTE 10/22/15 w/ grade 2 DD - recent d/c weight 117kg - diurese and follow BP - no ACEi/ARB for now due to recent acute renal failure   Filed Weights   11/03/15 0426 11/04/15 0300 11/05/15 0404  Weight: 122.1 kg (269 lb 2.9 oz) 120.4 kg (265 lb 6.9 oz) 122.4 kg (269 lb 14.4 oz)    HTN -BP has been marginal in face of infection - follow trend w/ diuresis   Chronic atrial fibrillation -Xarelto DC'd in preparation for surgery - cont heparin  gtt   Acute renal failure -most likely secondary to septic shock - resolved   Recent Labs Lab 11/01/15 0631 11/02/15 0539 11/03/15 0251 11/04/15 0253 11/05/15 0748  CREATININE 1.63* 0.87 0.83 0.70 0.65    COPD -Titrate O2 to maintain SPO2 89-93% - well compensated at this time  DM2 -2/17 A1c 7.4 - CBG currently reasonably controlled   Anxiety/Depression -well controlled at this time   Microcytic Anemia -no evidence of acute blood loss - all Fe indices low c/w anemia "of chronic disease" likely related to smoldering infection - avoid IV Fe in setting of infection, but can consider post amputation - B12 borderline so will replace x3 days   Obesity - Body mass index is 41.04 kg/m.   DVT prophylaxis: IV heparin  Code Status: NO CODE / DNR Family Communication: spoke w/ wife and daughter at bedside  Disposition Plan: stable for transfer to tele bed while awaiting surgical intervention   Consultants:  Vasc Surgery   Procedures: none  Antimicrobials:  Zosyn 8/9 > Vancomycin 8/9 >  Objective: Blood pressure (!) 112/57, pulse 95, temperature 98.2 F (36.8 C), temperature source Oral, resp. rate (!) 24, height 5\' 8"  (1.727 m), weight 122.4 kg (269 lb 14.4 oz), SpO2 95 %.  Intake/Output Summary (Last 24 hours) at 11/05/15 1431 Last data filed at 11/05/15 1300  Gross per 24 hour  Intake             3386 ml  Output  1375 ml  Net             2011 ml   Filed Weights   11/03/15 0426 11/04/15 0300 11/05/15 0404  Weight: 122.1 kg (269 lb 2.9 oz) 120.4 kg (265 lb 6.9 oz) 122.4 kg (269 lb 14.4 oz)    Examination: General: No acute respiratory distress Lungs: Clear to auscultation bilaterally without wheezes or crackles Cardiovascular: Regular rate and rhythm without murmur gallop or rub normal S1 and S2 Abdomen: Nontender, obese, soft, bowel sounds positive, no rebound, no ascites, no appreciable mass Extremities: 1+ B LE edema - B LE wounds dressed and  dry at time of my exam   CBC:  Recent Labs Lab 11/01/15 0631 11/02/15 0539 11/03/15 0251 11/04/15 0253 11/05/15 0748  WBC 16.0* 12.1* 11.4* 8.0 6.0  NEUTROABS  --  9.6* 8.8* 5.7 4.7  HGB 11.4* 10.0* 9.3* 9.4* 10.5*  HCT 39.4 33.1* 32.4* 34.0* 35.2*  MCV 72.0* 70.4* 72.8* 74.7* 73.0*  PLT 223 204 205 158 [id number]*   Basic Metabolic Panel:  Recent Labs Lab 11/01/15 0631 11/01/15 1840 11/02/15 0539 11/03/15 0251 11/04/15 0253 11/05/15 0748  NA 133*  --  138 136 138 138  K 4.0  --  4.1 3.9 3.7 4.3  CL 94*  --  99* 100* 101 106  CO2 28  --  26 30 26 25   GLUCOSE 125*  --  121* 117* 97 120*  BUN 35*  --  19 12 7  <5*  CREATININE 1.63*  --  0.87 0.83 0.70 0.65  CALCIUM 8.2*  --  8.5* 8.2* 8.3* 8.3*  MG  --  2.1 2.2 2.1 1.9 1.9   GFR: Estimated Creatinine Clearance: 100 mL/min (by C-G formula based on SCr of 0.8 mg/dL).  Liver Function Tests:  Recent Labs Lab 11/01/15 0631 11/02/15 0539 11/03/15 0251 11/04/15 0253 11/05/15 0748  AST 18 15 13* 13* 20  ALT 16* 13* 13* 12* 15*  ALKPHOS 61 52 48 45 48  BILITOT 1.0 0.8 0.6 0.7 0.6  PROT 5.9* 6.1* 5.5* 5.2* 5.4*  ALBUMIN 2.9* 3.2* 3.0* 2.8* 2.9*    HbA1C: Hgb A1c MFr Bld  Date/Time Value Ref Range Status  11/01/2015 11:34 AM 6.4 (H) 4.8 - 5.6 % Final    Comment:    (NOTE)         Pre-diabetes: 5.7 - 6.4         Diabetes: >6.4         Glycemic control for adults with diabetes: <7.0   05/10/2014 05:25 AM 7.4 (H) 4.8 - 5.6 % Final    Comment:    (NOTE)         Pre-diabetes: 5.7 - 6.4         Diabetes: >6.4         Glycemic control for adults with diabetes: <7.0     CBG:  Recent Labs Lab 11/04/15 1207 11/04/15 1626 11/04/15 2116 11/05/15 0804 11/05/15 1222  GLUCAP 195* 156* 133* 117* 176*    Recent Results (from the past 240 hour(s))  Blood Culture (routine x 2)     Status: None   Collection Time: 10/31/15  6:15 PM  Result Value Ref Range Status   Specimen Description BLOOD LEFT HAND  Final    Special Requests BOTTLES DRAWN AEROBIC AND ANAEROBIC 5ML   Final   Culture NO GROWTH 5 DAYS  Final   Report Status 11/05/2015 FINAL  Final  Blood Culture (routine x 2)  Status: None   Collection Time: 10/31/15  6:30 PM  Result Value Ref Range Status   Specimen Description BLOOD RIGHT HAND  Final   Special Requests BOTTLES DRAWN AEROBIC ONLY 5ML  Final   Culture NO GROWTH 5 DAYS  Final   Report Status 11/05/2015 FINAL  Final     Scheduled Meds: . antiseptic oral rinse  7 mL Mouth Rinse BID  . antiseptic oral rinse  7 mL Mouth Rinse BID  . atorvastatin  40 mg Oral q1800  . collagenase   Topical Daily  . diphenhydrAMINE  25 mg Intravenous Q12H  . docusate sodium  100 mg Oral BID  . DULoxetine  90 mg Oral Daily  . feeding supplement (GLUCERNA SHAKE)  237 mL Oral Q24H  . ferrous sulfate  325 mg Oral Q breakfast  . gabapentin  400 mg Oral TID  . insulin aspart  0-15 Units Subcutaneous TID WC  . insulin aspart  8 Units Subcutaneous TID WC  . insulin NPH Human  18 Units Subcutaneous BID AC & HS  . lidocaine  1 patch Transdermal Q24H  . lubiprostone  8 mcg Oral BID WC  . pantoprazole  80 mg Oral Daily  . piperacillin-tazobactam (ZOSYN)  IV  3.375 g Intravenous Q8H  . polyvinyl alcohol  1 drop Both Eyes BID  . sodium chloride flush  3 mL Intravenous Q12H  . tamsulosin  0.4 mg Oral QPC supper  . traZODone  100 mg Oral QHS  . vancomycin  1,250 mg Intravenous Q12H   Continuous Infusions: . sodium chloride 75 mL/hr at 11/05/15 0653  . heparin 1,800 Units/hr (11/05/15 1102)     LOS: 5 days   Cherene Altes, MD [facility] Office  [phone] Pager - Text Page per Shea Evans as per below:  On-Call/Text Page:      [email or web address]      password [email or web address]  If 7PM-7AM, please contact night-coverage [email or web address] Password [facility] 11/05/2015, 2:31 PM

## 2015-11-05 NOTE — Consult Note (Signed)
WOC follow-up: VVS service has consulted for left heel unstageable pressure injury and recommends amputation, according to the EMR; please refer to their team for further plan of care. Please re-consult if further assistance is needed.  Thank-you,  Julien Girt MSN, [place], [place], [place], [place]

## 2015-11-05 NOTE — Clinical Social Work Note (Signed)
Clinical Social Work Assessment  Patient Details  Name: [name] MRN: [id number] Date of Birth: 05-Sep-1940  Date of referral:  11/05/15               Reason for consult:  Facility Placement                Permission sought to share information with:  Facility Sport and exercise psychologist, Family Supports Permission granted to share information::  Yes, Verbal Permission Granted  Name::     [name]::  [name]  Relationship::  Spouse  Contact Information:  [phone]  Housing/Transportation Living arrangements for the past 2 months:  [place] of Information:  Spouse, Patient Patient Interpreter Needed:  None Criminal Activity/Legal Involvement Pertinent to Current Situation/Hospitalization:  No - Comment as needed Significant Relationships:  Spouse Lives with:  Facility Resident Do you feel safe going back to the place where you live?  Yes Need for family participation in patient care:  Yes (Comment)  Care giving concerns:  CSW received referral for possible SNF placement at time of discharge. CSW met with patient and patient's wife regarding patient return to SNF placement at time of discharge. Per patient's wife, patient's wife is currently unable to care for patient at their home given patient's current physical needs and fall risk. Patient and patient's wife expressed understanding of PT recommendation and are agreeable to SNF placement at time of discharge. CSW to continue to follow and assist with discharge planning needs.   Social Worker assessment / plan:  CSW spoke with patient and patient's wife concerning possibility of continuing rehab at [facility].  Employment status:  Retired Nurse, adult PT Recommendations:  Not assessed at this time Information / Referral to community resources:  [place]  Patient/Family's Response to care:  Patient and patient's wife recognize need for rehab and are agreeable to  returning to [facility] once patient is medically stable.  Patient/Family's Understanding of and Emotional Response to Diagnosis, Current Treatment, and Prognosis:  Patient/family is realistic regarding therapy needs and expressed being hopeful for SNF placement. No questions/concerns about plan or treatment.    Emotional Assessment Appearance:  Appears stated age Attitude/Demeanor/Rapport:  Other (Appropriate) Affect (typically observed):  Accepting Orientation:  Oriented to Self, Oriented to Place, Oriented to Situation, Oriented to  Time Alcohol / Substance use:  Not Applicable Psych involvement (Current and /or in the community):  No (Comment)  Discharge Needs  Concerns to be addressed:  Care Coordination Readmission within the last 30 days:  Yes Current discharge risk:  Dependent with Mobility Barriers to Discharge:  Continued Medical Work up   [employer], [place] 11/05/2015, 5:32 PM

## 2015-11-05 NOTE — NC FL2 (Signed)
[place] MEDICAID FL2 LEVEL OF CARE SCREENING TOOL     IDENTIFICATION  Patient Name: [name] Birthdate: [birth year] Sex: male Admission Date (Current Location): 10/31/2015  [facility] [place] Number:  Herbalist and Address:  [facility], [place] [address] [place], [place] [address]      Provider Number: [id number]  Attending Physician Name and Address:  Cherene Altes, MD  Relative Name and Phone Number:       Current Level of Care: Hospital Recommended Level of Care: [place] Prior Approval Number:    Date Approved/Denied:   PASRR Number: [id number]  Discharge Plan: SNF    Current Diagnoses: Patient Active Problem List   Diagnosis Date Noted  . Panlobular emphysema ([place])   . Chronic osteomyelitis of left foot ([place])   . Acute renal failure ([place])   . Gangrene ([place])   . Chronic atrial fibrillation ([place])   . Uncontrolled type 2 diabetes mellitus with complication ([place])   . Septic shock ([place]) 10/31/2015  . AKI (acute kidney injury) ([place]) 10/31/2015  . Diabetic ulcer of left foot ([place]) 10/31/2015  . Ulcer of foot due to diabetes ([place]) 10/31/2015  . Hypokalemia   . Pressure ulcer 10/24/2015  . Dyspnea   . Acute on chronic congestive heart failure ([place])   . Acute on chronic systolic (congestive) heart failure ([place]) 10/19/2015  . Acute on chronic respiratory failure with hypoxia ([place]) 10/19/2015  . Microcytic anemia 10/19/2015  . Atherosclerosis of native arteries of the extremities with ulceration ([place]) 08/03/2015  . Low back pain with sciatica 07/30/2015  . Chronic pulmonary embolism ([place]) 06/18/2015  . Diabetic peripheral neuropathy associated with type 2 diabetes mellitus ([place]) 06/18/2015  . Allergic rhinitis 05/22/2015  . Cellulitis of left lower leg 05/22/2015  . Venous ulcer of left leg ([place]) 05/22/2015  . Constipation due to opioid therapy 05/06/2015  . Vitamin D deficiency 05/03/2015  . Type II  diabetes mellitus with neurological manifestations ([place]) 02/15/2015  . Diabetic osteomyelitis ([place]) 02/15/2015  . Bilateral edema of lower extremity   . Dehydration 07/18/2014  . Diarrhea 07/18/2014  . Benign paroxysmal positional vertigo 05/26/2014  . OA (osteoarthritis) 05/12/2014  . Unsteady gait 04/12/2014  . Orthostatic hypotension 05/16/2013  . Generalized weakness 04/30/2013  . At high risk for falls 07/19/2012  . Falls 02/17/2012  . Bradycardia 01/22/2012  . Hemorrhoid 01/12/2012  . GERD (gastroesophageal reflux disease) 11/10/2011  . Leg edema 09/20/2011  . Peripheral vascular disease ([place]) 04/16/2011  . Osteomyelitis ([place]) 03/13/2011  . Chronic obstructive pulmonary disease ([place]) 02/27/2011  . Stasis dermatitis 12/04/2010  . BPH (benign prostatic hyperplasia) 11/01/2010  . Long term current use of anticoagulant therapy 05/03/2010  . FROZEN RIGHT SHOULDER 02/25/2010  . Depression 07/08/2006  . Essential hypertension 07/08/2006  . HYPERCHOLESTEROLEMIA 05/21/2006  . Anxiety state 05/21/2006  . Coronary atherosclerosis 05/21/2006  . Chronic systolic CHF (congestive heart failure) ([place]) 05/21/2006  . APNEA, SLEEP 05/21/2006    Orientation RESPIRATION BLADDER Height & Weight     Self, Time, Situation, Place  O2 (Nasal cannula 2L) Continent, External catheter (Urinary catheter) Weight: 122.4 kg (269 lb 14.4 oz) Height:  5\' 8"  (172.7 cm)  BEHAVIORAL SYMPTOMS/MOOD NEUROLOGICAL BOWEL NUTRITION STATUS      Incontinent Diet (Please see DC Summary)  AMBULATORY STATUS COMMUNICATION OF NEEDS Skin   Limited Assist Verbally PU Stage and Appropriate Care (PU Stage I on sacrum; unstageable on heel;Stage III on heel and  leg; open wound on leg and foot;)                       Personal Care Assistance Level of Assistance  Bathing, Feeding, Dressing Bathing Assistance: Maximum assistance Feeding assistance: Independent Dressing Assistance: Limited assistance     Functional  Limitations Info             [place]  PT (By licensed PT)     PT Frequency: not yet assessed              Contractures Contractures Info: Not present    Additional Factors Info  Code Status, Allergies, Psychotropic Code Status Info: DNR Allergies Info: Diazepam, Vancomycin           Current Medications (11/05/2015):  This is the current hospital active medication list Current Facility-Administered Medications  Medication Dose Route Frequency Provider Last Rate Last Dose  . 0.9 %  sodium chloride infusion   Intravenous Continuous Cherene Altes, MD 10 mL/hr at 11/05/15 1523    . antiseptic oral rinse (CPC / CETYLPYRIDINIUM CHLORIDE 0.05%) solution 7 mL  7 mL Mouth Rinse BID Etta Quill, DO   7 mL at 11/05/15 1000  . antiseptic oral rinse (CPC / CETYLPYRIDINIUM CHLORIDE 0.05%) solution 7 mL  7 mL Mouth Rinse BID Allie Bossier, MD   7 mL at 11/04/15 2135  . aspirin EC tablet 81 mg  81 mg Oral Daily Cherene Altes, MD   81 mg at 11/05/15 1603  . atorvastatin (LIPITOR) tablet 40 mg  40 mg Oral q1800 Etta Quill, DO   40 mg at 11/05/15 1706  . collagenase (SANTYL) ointment   Topical Daily Kaleen Mask, MD      . cyanocobalamin ((VITAMIN B-12)) injection 1,000 mcg  1,000 mcg Subcutaneous Daily Cherene Altes, MD   1,000 mcg at 11/05/15 1700  . diphenhydrAMINE (BENADRYL) injection 25 mg  25 mg Intravenous Q12H Allie Bossier, MD   25 mg at 11/05/15 1101  . docusate sodium (COLACE) capsule 100 mg  100 mg Oral BID Etta Quill, DO   100 mg at 11/05/15 0900  . DULoxetine (CYMBALTA) DR capsule 90 mg  90 mg Oral Daily Etta Quill, DO   90 mg at 11/05/15 0900  . feeding supplement (GLUCERNA SHAKE) (GLUCERNA SHAKE) liquid 237 mL  237 mL Oral Q24H Etta Quill, DO   237 mL at 11/05/15 1400  . fentaNYL (SUBLIMAZE) injection 25-50 mcg  25-50 mcg Intravenous Q2H PRN Juanito Doom, MD   50 mcg at 11/01/15 2240  . ferrous sulfate tablet 325  mg  325 mg Oral Q breakfast Etta Quill, DO   325 mg at 11/05/15 0854  . furosemide (LASIX) tablet 80 mg  80 mg Oral BID Cherene Altes, MD   80 mg at 11/05/15 1706  . gabapentin (NEURONTIN) capsule 400 mg  400 mg Oral TID Etta Quill, DO   400 mg at 11/05/15 1603  . heparin ADULT infusion 100 units/mL (25000 units/271mL sodium chloride 0.45%)  1,800 Units/hr Intravenous Continuous Allie Bossier, MD 18 mL/hr at 11/05/15 1102 1,800 Units/hr at 11/05/15 1102  . HYDROcodone-acetaminophen (NORCO) 7.5-325 MG per tablet 1 tablet  1 tablet Oral Q6H PRN Etta Quill, DO   1 tablet at 11/05/15 1711  . insulin aspart (novoLOG) injection 0-15 Units  0-15 Units Subcutaneous TID WC Etta Quill, DO  2 Units at 11/05/15 1613  . insulin aspart (novoLOG) injection 8 Units  8 Units Subcutaneous TID WC Allie Bossier, MD   8 Units at 11/05/15 1613  . insulin NPH Human (HUMULIN N,NOVOLIN N) injection 18 Units  18 Units Subcutaneous BID AC & HS Etta Quill, DO   18 Units at 11/05/15 0854  . lidocaine (LIDODERM) 5 % 1 patch  1 patch Transdermal Q24H Etta Quill, DO   1 patch at 11/05/15 0855  . LORazepam (ATIVAN) tablet 0.5 mg  0.5 mg Oral BID PRN Allie Bossier, MD   0.5 mg at 11/03/15 0008  . lubiprostone (AMITIZA) capsule 8 mcg  8 mcg Oral BID WC Etta Quill, DO   8 mcg at 11/05/15 1603  . pantoprazole (PROTONIX) EC tablet 80 mg  80 mg Oral Daily Etta Quill, DO   80 mg at 11/05/15 0900  . piperacillin-tazobactam (ZOSYN) IVPB 3.375 g  3.375 g Intravenous Q8H Rachel L Rumbarger, RPH   3.375 g at 11/05/15 1706  . polyvinyl alcohol (LIQUIFILM TEARS) 1.4 % ophthalmic solution 1 drop  1 drop Both Eyes BID Etta Quill, DO   1 drop at 11/05/15 0900  . sodium chloride flush (NS) 0.9 % injection 10-40 mL  10-40 mL Intracatheter Q12H Cherene Altes, MD      . sodium chloride flush (NS) 0.9 % injection 10-40 mL  10-40 mL Intracatheter PRN Cherene Altes, MD      . sodium chloride  flush (NS) 0.9 % injection 3 mL  3 mL Intravenous Q12H Etta Quill, DO   3 mL at 11/05/15 1000  . tamsulosin (FLOMAX) capsule 0.4 mg  0.4 mg Oral QPC supper Allie Bossier, MD   0.4 mg at 11/05/15 1706  . traZODone (DESYREL) tablet 100 mg  100 mg Oral QHS Etta Quill, DO   100 mg at 11/04/15 2134  . vancomycin (VANCOCIN) 1,250 mg in sodium chloride 0.9 % 250 mL IVPB  1,250 mg Intravenous Q12H Gay Filler Carney, RPH   1,250 mg at 11/05/15 1229     Discharge Medications: Please see discharge summary for a list of discharge medications.  Relevant Imaging Results:  Relevant Lab Results:   Additional Information SS#: [id number]  Benard Halsted, LCSWA

## 2015-11-05 NOTE — Progress Notes (Signed)
Patient ID: [name], male   DOB: [birth year], 75 y.o.   MRN: [id number]   Location:   [facility] Room Number: 159-B Place of Service:  SNF (31)   CODE STATUS: DNR  Allergies  Allergen Reactions  . Diazepam Anxiety    REACTION: makes patient cry  . Vancomycin Other (See Comments)    "red man syndrome"    Chief Complaint  Patient presents with  . Hospitalization Follow-up    Follow up    HPI:    Past Medical History:  Diagnosis Date  . Adhesive capsulitis   . Angina   . Anxiety   . CAD (coronary artery disease)   . CHF (congestive heart failure) ([place])   . Chronic back pain   . Chronic kidney disease    hx of BPH  . COPD (chronic obstructive pulmonary disease) ([place])   . CVA (cerebral infarction) Questionable history  . Depression   . Diabetes mellitus   . Diverticulosis   . Falls frequently 06/2014  . GERD (gastroesophageal reflux disease)   . Hyperlipidemia   . Hypertension   . 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 Myocardial infarction ([place])   . Neuromuscular disorder ([place])    HX of diabetic periferal neuropathy  . On home oxygen therapy    "1.5L prn" (04/12/2014)  . Osteomyelitis ([place]) 2012   Sternoclavicular joint   . PE (pulmonary embolism) 10-15 years ago   Lifelong Coumadin  . Peripheral vascular disease ([place])   . Shortness of breath   . Sleep apnea     Past Surgical History:  Procedure Laterality Date  . AMPUTATION  03/16/2011   Procedure: AMPUTATION DIGIT;  Surgeon: Angelia Mould, MD;  Location[facility];  Service: Vascular;  Laterality: Left;  Great toe  . CARDIAC CATHETERIZATION  October 18, 2010   Known obstructive disease, no further blockages  . CARDIAC CATHETERIZATION  03/17/2005   EF 35-40%  . COLONOSCOPY  2006    Multiple polyps removed, repeat in 5 years  . CORONARY ARTERY BYPASS GRAFT  1992  . [place], 2002  . DOPPLER ECHOCARDIOGRAPHY  Sept  2011   EF 50-55% with some impaired diastolic relaxation  . FEMORAL-POPLITEAL BYPASS GRAFT  2008   Left  . FRACTURE SURGERY  1980s   Hip  . HIP ARTHROPLASTY Left   . Right iliopopliteal bypass - [place]  . septic arthritis     Removal of infected CABG wire by Dr Arlyce Dice - 2011  . SPINE SURGERY  2002   Cervical fusion vertebroplasty C3-4-5  . US ECHOCARDIOGRAPHY  03/04/2006   EF 50-55%    Social History   Social History  . Marital status: Married    Spouse name: Butch Penny  . Number of children: 3  . Years of education: 12   Occupational History  . Retired-machinest/welder Retired   Social History Main Topics  . Smoking status: Former Smoker    Quit date: 06/19/2000  . Smokeless tobacco: Never Used  . Alcohol use No  . Drug use: No  . Sexual activity: No   Other Topics Concern  . Not on file   Social History Narrative   Health Care POA: TBD   Emergency Contact: wife, Butch Penny [phone]   End of Life Plan: pt does not have AD and not interested.   Who lives with you: Butch Penny and step-daughter-Betty   Any pets:  none   Diet: Pt has a varied diet of protein, starch and vegetables.   Exercise: Pt does not have regular exercise routine.   Seatbelts: Pt reports wearing seatbelt when in vehicles.    Hobbies: listening to car races.             Family History  Problem Relation Age of Onset  . Heart disease Father   . Heart disease Mother       VITAL SIGNS BP 114/82   Pulse 98   Temp (!) 96.9 F (36.1 C) (Oral)   Resp 20   Ht 5\' 6"  (1.676 m)   Wt 280 lb (127 kg)   SpO2 94%   BMI 45.19 kg/m   Patient's Medications  New Prescriptions   No medications on file  Previous Medications   CARVEDILOL (COREG) 3.125 MG TABLET    Take 1 tablet (3.125 mg total) by mouth 2 (two) times daily with a meal.   COLLAGENASE (SANTYL) OINTMENT    Apply 1 application topically daily.   DOCUSATE SODIUM (COLACE) 100 MG CAPSULE    Take 100 mg by mouth 2 (two) times daily.   DULOXETINE  (CYMBALTA) 30 MG CAPSULE    Take 90 mg by mouth daily.   FERROUS SULFATE 325 (65 FE) MG TABLET    Take 1 tablet (325 mg total) by mouth daily with breakfast.   FUROSEMIDE (LASIX) 80 MG TABLET    Take 1 tablet (80 mg total) by mouth 2 (two) times daily.   GABAPENTIN (NEURONTIN) 400 MG CAPSULE    Take 400 mg by mouth 3 (three) times daily.   HYDROCODONE-ACETAMINOPHEN (NORCO) 7.5-325 MG TABLET    Take 1 tablet by mouth every 6 (six) hours as needed for moderate pain.   INSULIN LISPRO (HUMALOG) 100 UNIT/ML CARTRIDGE    Inject into the skin. Inject as per sliding scale: if  0-70=0; 71-149 = 8 units, 150-600= 13 units subcutaneously before meals related to DM.   INSULIN NPH HUMAN (HUMULIN N,NOVOLIN N) 100 UNIT/ML INJECTION    Inject 0.24 mLs (24 Units total) into the skin 2 (two) times daily before a meal.   LIDOCAINE (LIDODERM) 5 %    Place 1 patch onto the skin daily.    LISINOPRIL (PRINIVIL,ZESTRIL) 2.5 MG TABLET    Take 2.5 mg by mouth daily.    LORAZEPAM (ATIVAN) 0.5 MG TABLET    Take 0.5 tablets (0.25 mg total) by mouth daily as needed for anxiety.   LUBIPROSTONE (AMITIZA) 8 MCG CAPSULE    Take 8 mcg by mouth 2 (two) times daily with a meal.   MULTIPLE VITAMINS-MINERALS (DECUBI-VITE PO)    Take 1 tablet by mouth daily.    NITROGLYCERIN (NITROSTAT) 0.4 MG SL TABLET    Place 1 tablet (0.4 mg total) under the tongue as needed. For chest pains   OMEPRAZOLE (PRILOSEC) 40 MG CAPSULE    Take 1 capsule (40 mg total) by mouth daily.   OXYGEN    Inhale 2 L into the lungs as needed (for congestion, cough, and wheezing related to COPD).   PROPYLENE GLYCOL (SYSTANE BALANCE) 0.6 % SOLN    Place 1 drop into both eyes 2 (two) times daily.    PROTEIN PO    Take 60 mLs by mouth 3 (three) times daily.    RIVAROXABAN (XARELTO) 20 MG TABS TABLET    Take 20 mg by mouth daily with supper.   SIMVASTATIN (ZOCOR) 80 MG TABLET    Take 1  tablet (80 mg total) by mouth daily.   SKIN PROTECTANTS, MISC. (CALAZIME SKIN PROTECTANT  EX)    Apply topically. Apply to sacrum and right buttock every day shift   SPIRONOLACTONE (ALDACTONE) 25 MG TABLET    Take 1 tablet (25 mg total) by mouth daily.   TAMSULOSIN (FLOMAX) 0.4 MG CAPS CAPSULE    Take 1 capsule (0.4 mg total) by mouth daily after supper.   TRAZODONE (DESYREL) 100 MG TABLET    Take 100 mg by mouth at bedtime.    VITAMIN D, ERGOCALCIFEROL, (DRISDOL) 50000 UNITS CAPS CAPSULE    Take 1 capsule (50,000 Units total) by mouth every 7 (seven) days.  Modified Medications   No medications on file  Discontinued Medications   INSULIN LISPRO (HUMALOG KWIKPEN) 100 UNIT/ML KIWKPEN    Inject 8 Units into the skin 3 (three) times daily before meals. An additional if CBG is 0-149=0 units, 150-400= 5 units before meals and at bedtime   TRAZODONE (DESYREL) 50 MG TABLET    Take 100 mg by mouth at bedtime.     SIGNIFICANT DIAGNOSTIC EXAMS  09-04-14: bilateral hip and pelvic x-ray: Left hip arthroplasty with mild acetabular protrusio. No fracture or dislocation is seen. No interval change.  09-04-14: right femur x-ray: No fracture or dislocation is seen.   09-04-14: left femur x-ray: No acute abnormality.  09-13-14: left hip and pelvic x-ray: No acute bony abnormality.   09-13-14: right shoulder x-ray: Advanced degenerative change in the shoulder joint. Negative for Fracture.  12-22-14: right hand and right wrist x-ray: no acute process; right hand osteoarthritis present.   07-26-15: chest x-ray: bilateral lower lobe atelectasis.   07-30-15: chest x-ray: no acute cardiopulmonary pathology     LABS REVIEWED:   11-06-14: inr 1.9  12-25-14: inr 3.03  02-23-15: tsh 4.032; vit d 11  03-08-15: wbc 7.7; hgb 11.5; hct 35.7; mcv 79.7; plt 156; glucose 115; bun 19; creat 0.96; k+ 4.6; na++146; liver normal albumin 3.4 INR 1.66  04-04-15: glucose 136; bun 19; creat 1.02; k+ 3.7; na++142 06-04-15: INR 2.77  06-18-15: INR 2.41  Urine for micro-albumin 0.7  06-25-15: wbc 8.0; hgb 12.1; hct 37.9 ;mcv  78.6; plt 159; glucose 122; bun 20; creat 0.93; k+ 4.3; na++142 liver normal albumin 3.6; chol 151; ldl 87; trig 162; hdl 32; hgb a1c 7.4 07-26-15: wbc 7.7; hgb 12.;3 hct 37.2; mcv 77.3; plt 106; glucose 161; bun 37; creat 1.75; k+ 4.3; na++136  07-30-15: wbc 12.8; hgb 10.7; hct 3.2; mcv 77.4; plt 174; g glucose 120; bun 43; creat 1.22; k+ 4.3; na++134 inr >10 (question reading of inr)      Review of Systems Constitutional: Negative for malaise/fatigue.  Respiratory: no cough or shortness of breath  Cardiovascular: Negative for chest pain, palpitations and leg swelling.  Gastrointestinal: Negative for heartburn, abdominal pain and constipation.  Musculoskeletal: bilateral leg pain.right knee left hip pain  and back spasms  Skin: Negative.   Neurological: Negative for headaches.  Psychiatric/Behavioral: . The patient is not nervous/anxious    Physical Exam Constitutional: He is oriented to person, place, and time. No distress.  Obese   Neck: Neck supple. No JVD present. No thyromegaly present.  Cardiovascular: Normal rate, regular rhythm and intact distal pulses.   Respiratory: Effort normal and breath sounds normal. No respiratory distress.  GI: Soft. Bowel sounds are normal. He exhibits no distension. There is no tenderness.  Musculoskeletal:  Is able to move all extremities 1+ lower extremity edema  Neurological: He is alert and oriented to person, place, and time.  Skin: Skin is warm and dry. He is not diaphoretic.  Lower extremities discolored Right knee and bilateral lower legs very tender to touch Right heel unstaged: 1.6 x 2.4 cm calcium alginate Left lateral shin 0.6 x 0.4 x 0.05 cm calcium alginate Left lateral lower leg: 3 x 2.8 x 0.1 cm Left dorsal foot: 3 x 2.6 x 0.1 cm santyl Right foot medial: 1.1 x 0.8 cm calcium alginate    Psychiatric: He has a normal mood and affect.       ASSESSMENT/ PLAN:  1. Diabetic peripheral neuropathy: will continue cymbalta  90 mg  daily will increase neurontin to 400 mg three times daily   2. PVD with venous ulcerations: will continue current treatment and will continue to monitor his status.            MD is aware of resident's narcotic use and is in agreement with current plan of care. We will attempt to wean resident as apropriate   Ok Edwards NP [facility] Adult [facility]  Contact [phone] Monday through Friday 8am- 5pm  After hours call [phone]    This encounter was created in error - please disregard.

## 2015-11-05 NOTE — Progress Notes (Signed)
  Progress Note    11/05/2015 7:54 AM * No surgery date entered *  Subjective:  No complaints this a.m.  Vitals:   11/05/15 0000 11/05/15 0404  BP: (!) 104/52 (!) 103/49  Pulse:  91  Resp: 17 17  Temp:  98.1 F (36.7 C)    Physical Exam: Irregular rhythm Non labored breathing Edema resolving ble Palpable left brachial/radial pulses  CBC    Component Value Date/Time   WBC 8.0 11/04/2015 0253   RBC 4.55 11/04/2015 0253   HGB 9.4 (L) 11/04/2015 0253   HCT 34.0 (L) 11/04/2015 0253   PLT 158 11/04/2015 0253   MCV 74.7 (L) 11/04/2015 0253   MCH 20.7 (L) 11/04/2015 0253   MCHC 27.6 (L) 11/04/2015 0253   RDW 23.3 (H) 11/04/2015 0253   LYMPHSABS 1.3 11/04/2015 0253   MONOABS 0.8 11/04/2015 0253   EOSABS 0.2 11/04/2015 0253   BASOSABS 0.0 11/04/2015 0253    BMET    Component Value Date/Time   NA 138 11/04/2015 0253   NA 136 (A) 07/26/2015   K 3.7 11/04/2015 0253   CL 101 11/04/2015 0253   CO2 26 11/04/2015 0253   GLUCOSE 97 11/04/2015 0253   BUN 7 11/04/2015 0253   BUN 37 (A) 07/26/2015   CREATININE 0.70 11/04/2015 0253   CREATININE 1.11 06/14/2013 1221   CALCIUM 8.3 (L) 11/04/2015 0253   GFRNONAA >60 11/04/2015 0253   GFRAA >60 11/04/2015 0253    INR    Component Value Date/Time   INR 1.39 10/19/2015 1833     Intake/Output Summary (Last 24 hours) at 11/05/15 0754 Last data filed at 11/05/15 0408  Gross per 24 hour  Intake             2910 ml  Output             1975 ml  Net              935 ml     Assessment:  75 y.o. male here with sepsis thought 2/2 to left heel ulcer. Plan angiogram tomorrow if schedule allows. Discussed Left AKA with wife and patient yesterday, daughter coming today and will have further discussion. Goal of angiogram with be to salvage Right foot that currently has minimal heel ulceration. Continue heparin for now, also need aspirin and statin if not otherwise contraindicated.    Clevie Prout C. Donzetta Matters, MD [facility] Office: [phone] Pager: [phone]  11/05/2015 7:54 AM

## 2015-11-06 ENCOUNTER — Inpatient Hospital Stay (HOSPITAL_COMMUNITY)
Admission: EM | Disposition: A | Discharge status: Skilled Nursing Facility | Payer: Self-pay | Source: Home / Self Care | Attending: Internal Medicine

## 2015-11-06 ENCOUNTER — Encounter (HOSPITAL_COMMUNITY): Payer: Self-pay | Admitting: Surgery

## 2015-11-06 DIAGNOSIS — I70244 Atherosclerosis of native arteries of left leg with ulceration of heel and midfoot: Secondary | ICD-10-CM

## 2015-11-06 DIAGNOSIS — I70234 Atherosclerosis of native arteries of right leg with ulceration of heel and midfoot: Secondary | ICD-10-CM

## 2015-11-06 HISTORY — PX: PERIPHERAL VASCULAR CATHETERIZATION: SHX172C

## 2015-11-06 LAB — SURGICAL PCR SCREEN
MRSA, PCR: POSITIVE — AB
Staphylococcus aureus: POSITIVE — AB

## 2015-11-06 LAB — COMPREHENSIVE METABOLIC PANEL
ALBUMIN: 2.7 g/dL — AB (ref 3.5–5.0)
ALK PHOS: 47 U/L (ref 38–126)
ALT: 16 U/L — ABNORMAL LOW (ref 17–63)
ANION GAP: 4 — AB (ref 5–15)
AST: 16 U/L (ref 15–41)
BUN: <5 mg/dL — ABNORMAL LOW (ref 6–20)
CALCIUM: 8.4 mg/dL — AB (ref 8.9–10.3)
CHLORIDE: 103 mmol/L (ref 101–111)
CO2: 33 mmol/L — AB (ref 22–32)
Creatinine, Ser: 0.71 mg/dL (ref 0.61–1.24)
GFR calc non Af Amer: >60 mL/min (ref >60)
GLUCOSE: 113 mg/dL — AB (ref 65–99)
Potassium: 3.4 mmol/L — ABNORMAL LOW (ref 3.5–5.1)
SODIUM: 140 mmol/L (ref 135–145)
Total Bilirubin: 0.6 mg/dL (ref 0.3–1.2)
Total Protein: 5.8 g/dL — ABNORMAL LOW (ref 6.5–8.1)

## 2015-11-06 LAB — CBC
HEMATOCRIT: 34.4 % — AB (ref 39.0–52.0)
HEMOGLOBIN: 9.6 g/dL — AB (ref 13.0–17.0)
MCH: 20.9 pg — ABNORMAL LOW (ref 26.0–34.0)
MCHC: 27.9 g/dL — ABNORMAL LOW (ref 30.0–36.0)
MCV: 74.8 fL — ABNORMAL LOW (ref 78.0–100.0)
Platelets: 193 10*3/uL (ref 150–400)
RBC: 4.6 MIL/uL (ref 4.22–5.81)
RDW: 23.7 % — AB (ref 11.5–15.5)
WBC: 7.2 10*3/uL (ref 4.0–10.5)

## 2015-11-06 LAB — GLUCOSE, CAPILLARY
GLUCOSE-CAPILLARY: 96 mg/dL (ref 65–99)
Glucose-Capillary: 125 mg/dL — ABNORMAL HIGH (ref 65–99)
Glucose-Capillary: 180 mg/dL — ABNORMAL HIGH (ref 65–99)
Glucose-Capillary: 97 mg/dL (ref 65–99)

## 2015-11-06 LAB — HEPARIN LEVEL (UNFRACTIONATED)

## 2015-11-06 LAB — POCT ACTIVATED CLOTTING TIME: ACTIVATED CLOTTING TIME: 147 s

## 2015-11-06 SURGERY — ABDOMINAL AORTOGRAM

## 2015-11-06 MED ORDER — MAGNESIUM SULFATE 2 GM/50ML IV SOLN
2.0000 g | Freq: Once | INTRAVENOUS | Status: AC
  Administered 2015-11-06: 2 g via INTRAVENOUS
  Filled 2015-11-06: qty 50

## 2015-11-06 MED ORDER — MIDAZOLAM HCL 2 MG/2ML IJ SOLN
INTRAMUSCULAR | Status: AC
  Filled 2015-11-06: qty 2

## 2015-11-06 MED ORDER — ONDANSETRON HCL 4 MG/2ML IJ SOLN: 4.0000 mg | Freq: Four times a day (QID) | INTRAMUSCULAR | Status: DC | PRN

## 2015-11-06 MED ORDER — METOPROLOL TARTRATE 5 MG/5ML IV SOLN: 2.0000 mg | INTRAVENOUS | Status: DC | PRN

## 2015-11-06 MED ORDER — HYDRALAZINE HCL 20 MG/ML IJ SOLN: 5.0000 mg | INTRAMUSCULAR | Status: DC | PRN

## 2015-11-06 MED ORDER — ACETAMINOPHEN 325 MG PO TABS
325.0000 mg | ORAL_TABLET | ORAL | Status: DC | PRN
  Administered 2015-11-08 – 2015-11-10 (×2): 650 mg via ORAL
  Filled 2015-11-06 (×2): qty 2

## 2015-11-06 MED ORDER — ADULT MULTIVITAMIN W/MINERALS CH
1.0000 | ORAL_TABLET | Freq: Every day | ORAL | Status: DC
  Administered 2015-11-06 – 2015-11-10 (×5): 1 via ORAL
  Filled 2015-11-06 (×4): qty 1

## 2015-11-06 MED ORDER — POTASSIUM CHLORIDE CRYS ER 20 MEQ PO TBCR
40.0000 meq | EXTENDED_RELEASE_TABLET | Freq: Once | ORAL | Status: AC
  Administered 2015-11-06: 40 meq via ORAL
  Filled 2015-11-06: qty 2

## 2015-11-06 MED ORDER — PRO-STAT SUGAR FREE PO LIQD
30.0000 mL | Freq: Two times a day (BID) | ORAL | Status: DC
  Administered 2015-11-06 – 2015-11-10 (×7): 30 mL via ORAL
  Filled 2015-11-06 (×7): qty 30

## 2015-11-06 MED ORDER — ALUM & MAG HYDROXIDE-SIMETH 200-200-20 MG/5ML PO SUSP: 15.0000 mL | ORAL | Status: DC | PRN

## 2015-11-06 MED ORDER — DOCUSATE SODIUM 100 MG PO CAPS: 100.0000 mg | ORAL_CAPSULE | Freq: Every day | ORAL | Status: DC

## 2015-11-06 MED ORDER — CARVEDILOL 3.125 MG PO TABS
3.1250 mg | ORAL_TABLET | Freq: Two times a day (BID) | ORAL | Status: DC
  Administered 2015-11-06 – 2015-11-10 (×7): 3.125 mg via ORAL
  Filled 2015-11-06 (×9): qty 1

## 2015-11-06 MED ORDER — MIDAZOLAM HCL 2 MG/2ML IJ SOLN
INTRAMUSCULAR | Status: DC | PRN
  Administered 2015-11-06 (×2): 1 mg via INTRAVENOUS

## 2015-11-06 MED ORDER — CHLORHEXIDINE GLUCONATE CLOTH 2 % EX PADS
6.0000 | MEDICATED_PAD | Freq: Every day | CUTANEOUS | Status: DC
  Administered 2015-11-06 – 2015-11-10 (×3): 6 via TOPICAL

## 2015-11-06 MED ORDER — IODIXANOL 320 MG/ML IV SOLN
INTRAVENOUS | Status: DC | PRN
  Administered 2015-11-06: 125 mL via INTRAVENOUS

## 2015-11-06 MED ORDER — PHENOL 1.4 % MT LIQD
1.0000 | OROMUCOSAL | Status: DC | PRN
Start: 2015-11-06 — End: 2015-11-10

## 2015-11-06 MED ORDER — LABETALOL HCL 5 MG/ML IV SOLN: 10.0000 mg | INTRAVENOUS | Status: DC | PRN

## 2015-11-06 MED ORDER — MUPIROCIN 2 % EX OINT
1.0000 "application " | TOPICAL_OINTMENT | Freq: Two times a day (BID) | CUTANEOUS | Status: DC
  Administered 2015-11-06 – 2015-11-10 (×8): 1 via NASAL
  Filled 2015-11-06 (×3): qty 22

## 2015-11-06 MED ORDER — LIDOCAINE HCL (PF) 1 % IJ SOLN
INTRAMUSCULAR | Status: DC | PRN
  Administered 2015-11-06: 12 mL

## 2015-11-06 MED ORDER — SODIUM CHLORIDE 0.9 % IV SOLN
INTRAVENOUS | Status: DC
  Administered 2015-11-06: 13:00:00 via INTRAVENOUS

## 2015-11-06 MED ORDER — FENTANYL CITRATE (PF) 100 MCG/2ML IJ SOLN
INTRAMUSCULAR | Status: DC | PRN
  Administered 2015-11-06 (×4): 25 ug via INTRAVENOUS

## 2015-11-06 MED ORDER — FENTANYL CITRATE (PF) 100 MCG/2ML IJ SOLN
INTRAMUSCULAR | Status: AC
  Filled 2015-11-06: qty 2

## 2015-11-06 MED ORDER — HEPARIN (PORCINE) IN NACL 2-0.9 UNIT/ML-% IJ SOLN
INTRAMUSCULAR | Status: AC
Start: 2015-11-06 — End: 2015-11-06
  Filled 2015-11-06: qty 1000

## 2015-11-06 MED ORDER — HEPARIN (PORCINE) IN NACL 100-0.45 UNIT/ML-% IJ SOLN
2700.0000 [IU]/h | INTRAMUSCULAR | Status: DC
  Administered 2015-11-06: 1800 [IU]/h via INTRAVENOUS
  Administered 2015-11-07: 2300 [IU]/h via INTRAVENOUS
  Filled 2015-11-06 (×3): qty 250

## 2015-11-06 MED ORDER — GUAIFENESIN-DM 100-10 MG/5ML PO SYRP: 15.0000 mL | ORAL_SOLUTION | ORAL | Status: DC | PRN

## 2015-11-06 MED ORDER — ACETAMINOPHEN 325 MG RE SUPP: 325.0000 mg | RECTAL | Status: DC | PRN

## 2015-11-06 MED ORDER — HEPARIN SODIUM (PORCINE) 1000 UNIT/ML IJ SOLN
INTRAMUSCULAR | Status: DC | PRN
  Administered 2015-11-06: 1000 [IU] via INTRAVENOUS

## 2015-11-06 MED ORDER — LIDOCAINE HCL (PF) 1 % IJ SOLN
INTRAMUSCULAR | Status: AC
  Filled 2015-11-06: qty 30

## 2015-11-06 SURGICAL SUPPLY — 16 items
CATH OMNI FLUSH 5F 65CM (CATHETERS) ×4 IMPLANT
CATH SOFT-VU 4F 65 STRAIGHT (CATHETERS) ×2 IMPLANT
CATH SOFT-VU STRAIGHT 4F 65CM (CATHETERS) ×2
CATH STRAIGHT 5FR 65CM (CATHETERS) ×4 IMPLANT
COVER PRB 48X5XTLSCP FOLD TPE (BAG) ×2 IMPLANT
COVER PROBE 5X48 (BAG) ×2
DEVICE TORQUE H2O (MISCELLANEOUS) ×4 IMPLANT
DRAPE ZERO GRAVITY STERILE (DRAPES) ×4 IMPLANT
GUIDEWIRE ANGLED .035X150CM (WIRE) ×4 IMPLANT
KIT MICROINTRODUCER STIFF 5F (SHEATH) ×4 IMPLANT
KIT PV (KITS) ×4 IMPLANT
SHEATH PINNACLE 5F 10CM (SHEATH) ×4 IMPLANT
SYR MEDRAD MARK V 150ML (SYRINGE) ×4 IMPLANT
TRANSDUCER W/STOPCOCK (MISCELLANEOUS) ×4 IMPLANT
TRAY PV CATH (CUSTOM PROCEDURE TRAY) ×4 IMPLANT
WIRE BENTSON .035X145CM (WIRE) ×4 IMPLANT

## 2015-11-06 NOTE — Progress Notes (Addendum)
Patient arrived the unit on a hospital bed from the cath lab, assessment completed see flowsheet,placed on tele ccmd notified, bed in lowest position, call light within reach will continue to monitor.

## 2015-11-06 NOTE — Progress Notes (Signed)
Patient will be off floor at 0930 when labs are scheduled to be drawn. Called and asked Barbaraann Rondo about having labs drawn while pt is in cath lab, states he will speak with MD.

## 2015-11-06 NOTE — Progress Notes (Signed)
[place] for heparin Indication: atrial fibrillation  Allergies  Allergen Reactions  . Diazepam Anxiety    REACTION: makes patient cry  . Vancomycin Other (See Comments)    "red man syndrome"    Patient Measurements: Height: 5\' 8"  (172.7 cm) Weight: 265 lb 6.4 oz (120.4 kg) IBW/kg (Calculated) : 68.4 Heparin Dosing Weight: 95kg  Vital Signs: Temp: 98.4 F (36.9 C) (08/15 1156) Temp Source: Oral (08/15 1156) BP: 112/59 (08/15 1156) Pulse Rate: 94 (08/15 1156)  Labs:  Recent Labs  11/04/15 0253 11/05/15 0748 11/06/15 1145  HGB 9.4* 10.5* 9.6*  HCT 34.0* 35.2* 34.4*  PLT 158 116* 193  HEPARINUNFRC 0.39  --  <0.10*  CREATININE 0.70 0.65 0.71     Medications:  Infusions:  . sodium chloride 10 mL/hr at 11/05/15 1523  . sodium chloride 50 mL/hr at 11/06/15 1251  . heparin 1,800 Units/hr (11/06/15 0100)    Assessment: 78 yom presented to the ED with syncope. He is on chronic xarelto for history of afib but switched to heparin on admission for procedures. 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 Pharmacy consulted to resume heparin 6 hours post-sheath removal (removed at 1120 per RN). Platelets were trending down and HIT Ab was checked to r/o HIT. However, platelets have trended back up nicely to 193 today - likely not HIT. Hg low stable, no bleed issues per RN.  Goal of Therapy:  Heparin level 0.3-0.7 units/ml Monitor platelets by anticoagulation protocol: Yes   Plan:  -Restart heparin at previous 1800 units/h at 1730 -6h HL, daily HL/CBC -Monitor s/sx bleeding -F/u ability to restart Xarelto after AKA/surgeries completed    Elicia Lamp, PharmD, BCPS Clinical Pharmacist Pager [phone] 11/06/2015 1:16 PM

## 2015-11-06 NOTE — Progress Notes (Signed)
Initial Nutrition Assessment  DOCUMENTATION CODES:   Morbid obesity  INTERVENTION:   -30 ml Prostat BID, each supplement provides 100 kcals and 15 grams protein -MVI daily  NUTRITION DIAGNOSIS:   Increased nutrient needs related to wound healing as evidenced by estimated needs.  GOAL:   Patient will meet greater than or equal to 90% of their needs  MONITOR:   PO intake, Supplement acceptance, Labs, Weight trends, Skin, I & O's  REASON FOR ASSESSMENT:   Low Braden    ASSESSMENT:    75 y.o. male here with sepsis thought 2/2 to left heel ulcer.  Pt admitted with severe sepsis secondary to bilateral foot osteomyelitis.   Pt down for angiogram at time of visit. No family present.  Unable to complete Nutrition-Focused physical exam at this time. However, NFPE done at previous admission (10/20/15), which was WDL. Do not suspect any changes to exam at this time.   Reviewed wt hx. Wt typically fluctuates on 260-285#.   Per vascular surgery notes, pt may undergo lt AKA at a later date.   Appetite has been good. Meal completion 50-100%. Pt with increased nutrient needs related to wound healing.   Reviewed DM hx. Noted last Hgb A1c (11/01/15) was 6.4, which demonstrates excellent glycemic control.   Labs reviewed: K: 3.4, CBGS: 96-180.   Diet Order:  Diet heart healthy/carb modified Room service appropriate? Yes; Fluid consistency: Thin  Skin:  Wound (see comment) (UN lt heel, ulceration to rt heel)  Last BM:  11/05/15  Height:   Ht Readings from Last 1 Encounters:  11/02/15 5\' 8"  (1.727 m)    Weight:   Wt Readings from Last 1 Encounters:  11/05/15 265 lb 6.4 oz (120.4 kg)    Ideal Body Weight:  70 kg  BMI:  Body mass index is 42.84 kg/m.  Estimated Nutritional Needs:   Kcal:  2100-2300  Protein:  105-120 grams  Fluid:  2.1-2.3 L  EDUCATION NEEDS:   No education needs identified at this time  Rodger Giangregorio A. Jimmye Norman, RD, LDN, CDE Pager: [phone] After  hours Pager: [phone]

## 2015-11-06 NOTE — Interval H&P Note (Signed)
History and Physical Interval Note:  11/06/2015 9:45 AM  [name]  has presented today for surgery, with the diagnosis of bilateral heel ulcers  The various methods of treatment have been discussed with the patient and family. After consideration of risks, benefits and other options for treatment, the patient has consented to  Procedure(s): Abdominal Aortogram w/Lower Extremity (N/A) as a surgical intervention .  The patient's history has been reviewed, patient examined, no change in status, stable for surgery.  I have reviewed the patient's chart and labs.  Questions were answered to the patient's satisfaction.     Annamarie Major

## 2015-11-06 NOTE — H&P (View-Only) (Signed)
  Progress Note    11/05/2015 7:54 AM * No surgery date entered *  Subjective:  No complaints this a.m.  Vitals:   11/05/15 0000 11/05/15 0404  BP: (!) 104/52 (!) 103/49  Pulse:  91  Resp: 17 17  Temp:  98.1 F (36.7 C)    Physical Exam: Irregular rhythm Non labored breathing Edema resolving ble Palpable left brachial/radial pulses  CBC    Component Value Date/Time   WBC 8.0 11/04/2015 0253   RBC 4.55 11/04/2015 0253   HGB 9.4 (L) 11/04/2015 0253   HCT 34.0 (L) 11/04/2015 0253   PLT 158 11/04/2015 0253   MCV 74.7 (L) 11/04/2015 0253   MCH 20.7 (L) 11/04/2015 0253   MCHC 27.6 (L) 11/04/2015 0253   RDW 23.3 (H) 11/04/2015 0253   LYMPHSABS 1.3 11/04/2015 0253   MONOABS 0.8 11/04/2015 0253   EOSABS 0.2 11/04/2015 0253   BASOSABS 0.0 11/04/2015 0253    BMET    Component Value Date/Time   NA 138 11/04/2015 0253   NA 136 (A) 07/26/2015   K 3.7 11/04/2015 0253   CL 101 11/04/2015 0253   CO2 26 11/04/2015 0253   GLUCOSE 97 11/04/2015 0253   BUN 7 11/04/2015 0253   BUN 37 (A) 07/26/2015   CREATININE 0.70 11/04/2015 0253   CREATININE 1.11 06/14/2013 1221   CALCIUM 8.3 (L) 11/04/2015 0253   GFRNONAA >60 11/04/2015 0253   GFRAA >60 11/04/2015 0253    INR    Component Value Date/Time   INR 1.39 10/19/2015 1833     Intake/Output Summary (Last 24 hours) at 11/05/15 0754 Last data filed at 11/05/15 0408  Gross per 24 hour  Intake             2910 ml  Output             1975 ml  Net              935 ml     Assessment:  75 y.o. male here with sepsis thought 2/2 to left heel ulcer. Plan angiogram tomorrow if schedule allows. Discussed Left AKA with wife and patient yesterday, daughter coming today and will have further discussion. Goal of angiogram with be to salvage Right foot that currently has minimal heel ulceration. Continue heparin for now, also need aspirin and statin if not otherwise contraindicated.    Chikita Dogan C. Donzetta Matters, MD [facility] [place] Office: [phone] Pager: [phone]  11/05/2015 7:54 AM
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
## 2015-11-06 NOTE — Progress Notes (Signed)
Informed Dr. Erlinda Hong positive MRSA PCR

## 2015-11-06 NOTE — Progress Notes (Signed)
Pt had heparin infusing in peripheral IV. Pt frequently bending arm, and c/o alarm going off. Heparin switch to PICC, lab tech unable to draw labs due to pt being difficult to find veins. IV team notified, heparin switch back to peripheral IV. Per pharmacy 30 min needs to pass before labs can be completed. IV team notified to collect all labs.

## 2015-11-06 NOTE — Progress Notes (Signed)
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 During that time he was treated with aggressive diuresis and his diuretics were adjusted on discharge. On the day of his readmission he started feeling weak w/ a headache and neck pain. He then passed out while sitting in a chair in his nursing home so he was sent to [facility].   In the ED he was noted to have leukocytosis, hypotension, and acute kidney injury, as well as chronic wounds on his legs and sacral decubitus ulcers. The patient was hypotensive in the ED and required volume resuscitation.  Subjective: Patient returned from arteriogram, he denies pain, no sob at rest, aaox3.    Assessment & Plan:  Septic shock / Bilateral foot Osteomyelitis vs Gangrene -Vascular Surgery to perform arteriogram on 8/15 - L AKA on Thursday  Chronic combined systolic and diastolic CHF -EF [id number] via TTE 10/22/15 w/ grade 2 DD - recent d/c weight 117kg - diurese and follow BP - no ACEi/ARB for now due to recent acute renal failure , restart low dose coreg Close monitor volume status, currently euvolemic  Filed Weights   11/04/15 0300 11/05/15 0404 11/05/15 1905  Weight: 120.4 kg (265 lb 6.9 oz) 122.4 kg (269 lb 14.4 oz) 120.4 kg (265 lb 6.4 oz)    HTN -BP has been marginal in face of infection - follow trend w/ diuresis   Chronic atrial fibrillation -Xarelto DC'd in preparation for surgery - cont heparin gtt   Acute renal failure -most likely secondary  to septic shock - resolved   Recent Labs Lab 11/01/15 0631 11/02/15 0539 11/03/15 0251 11/04/15 0253 11/05/15 0748  CREATININE 1.63* 0.87 0.83 0.70 0.65    COPD -Titrate O2 to maintain SPO2 89-93% - well compensated at this time  DM2 -2/17 A1c 7.4 - CBG currently reasonably controlled   Anxiety/Depression -well controlled at this time   Microcytic Anemia -no evidence of acute blood loss - all Fe indices low c/w anemia "of chronic disease" likely related to smoldering infection - avoid IV Fe in setting of infection, but can consider post amputation - B12 borderline so will replace x3 days   MRSA screening positive: on decolonization protocol   Morbid Obesity - Body mass index is 42.84 kg/m.   DVT prophylaxis: IV heparin  Code Status: NO CODE / DNR Family Communication: patient Disposition Plan: pending, eventually to SNF   Consultants:  Vasc Surgery   Procedures: none  Antimicrobials:  Zosyn 8/9 > Vancomycin 8/9 >  Objective: Blood pressure (!) 109/47, pulse 93, temperature 98.1 F (36.7 C), temperature source Oral, resp. rate 18, height 5\' 8"  (1.727 m), weight 120.4 kg (265 lb 6.4 oz), SpO2 96 %.  Intake/Output Summary (Last 24 hours) at 11/06/15 0802 Last data filed at 11/05/15 1910  Gross per 24 hour  Intake          1451.92 ml  Output             1275 ml  Net  176.92 ml   Filed Weights   11/04/15 0300 11/05/15 0404 11/05/15 1905  Weight: 120.4 kg (265 lb 6.9 oz) 122.4 kg (269 lb 14.4 oz) 120.4 kg (265 lb 6.4 oz)    Examination: General: No acute respiratory distress, aaox3 Lungs: Clear to auscultation bilaterally without wheezes or crackles Cardiovascular: Regular rate and rhythm without murmur gallop or rub normal S1 and S2 Abdomen: Nontender, obese, soft, bowel sounds positive, no rebound, no ascites, no appreciable mass Extremities: on bilateral heal protectors, no significant edema appreciated  CBC:  Recent Labs Lab  11/01/15 0631 11/02/15 0539 11/03/15 0251 11/04/15 0253 11/05/15 0748  WBC 16.0* 12.1* 11.4* 8.0 6.0  NEUTROABS  --  9.6* 8.8* 5.7 4.7  HGB 11.4* 10.0* 9.3* 9.4* 10.5*  HCT 39.4 33.1* 32.4* 34.0* 35.2*  MCV 72.0* 70.4* 72.8* 74.7* 73.0*  PLT 223 204 205 158 [id number]*   Basic Metabolic Panel:  Recent Labs Lab 11/01/15 0631 11/01/15 1840 11/02/15 0539 11/03/15 0251 11/04/15 0253 11/05/15 0748  NA 133*  --  138 136 138 138  K 4.0  --  4.1 3.9 3.7 4.3  CL 94*  --  99* 100* 101 106  CO2 28  --  26 30 26 25   GLUCOSE 125*  --  121* 117* 97 120*  BUN 35*  --  19 12 7  <5*  CREATININE 1.63*  --  0.87 0.83 0.70 0.65  CALCIUM 8.2*  --  8.5* 8.2* 8.3* 8.3*  MG  --  2.1 2.2 2.1 1.9 1.9   GFR: Estimated Creatinine Clearance: 99.1 mL/min (by C-G formula based on SCr of 0.8 mg/dL).  Liver Function Tests:  Recent Labs Lab 11/01/15 0631 11/02/15 0539 11/03/15 0251 11/04/15 0253 11/05/15 0748  AST 18 15 13* 13* 20  ALT 16* 13* 13* 12* 15*  ALKPHOS 61 52 48 45 48  BILITOT 1.0 0.8 0.6 0.7 0.6  PROT 5.9* 6.1* 5.5* 5.2* 5.4*  ALBUMIN 2.9* 3.2* 3.0* 2.8* 2.9*    HbA1C: Hgb A1c MFr Bld  Date/Time Value Ref Range Status  11/01/2015 11:34 AM 6.4 (H) 4.8 - 5.6 % Final    Comment:    (NOTE)         Pre-diabetes: 5.7 - 6.4         Diabetes: >6.4         Glycemic control for adults with diabetes: <7.0   05/10/2014 05:25 AM 7.4 (H) 4.8 - 5.6 % Final    Comment:    (NOTE)         Pre-diabetes: 5.7 - 6.4         Diabetes: >6.4         Glycemic control for adults with diabetes: <7.0     CBG:  Recent Labs Lab 11/04/15 2116 11/05/15 0804 11/05/15 1222 11/05/15 1611 11/05/15 2132  GLUCAP 133* 117* 176* 134* 259*    Recent Results (from the past 240 hour(s))  Blood Culture (routine x 2)     Status: None   Collection Time: 10/31/15  6:15 PM  Result Value Ref Range Status   Specimen Description BLOOD LEFT HAND  Final   Special Requests BOTTLES DRAWN AEROBIC AND ANAEROBIC 5ML    Final   Culture NO GROWTH 5 DAYS  Final   Report Status 11/05/2015 FINAL  Final  Blood Culture (routine x 2)     Status: None   Collection Time: 10/31/15  6:30 PM  Result Value Ref Range Status   Specimen Description BLOOD RIGHT  HAND  Final   Special Requests BOTTLES DRAWN AEROBIC ONLY 5ML  Final   Culture NO GROWTH 5 DAYS  Final   Report Status 11/05/2015 FINAL  Final     Scheduled Meds: . antiseptic oral rinse  7 mL Mouth Rinse BID  . antiseptic oral rinse  7 mL Mouth Rinse BID  . aspirin EC  81 mg Oral Daily  . atorvastatin  40 mg Oral q1800  . carvedilol  3.125 mg Oral BID WC  . collagenase   Topical Daily  . cyanocobalamin  1,000 mcg Subcutaneous Daily  . diphenhydrAMINE  25 mg Intravenous Q12H  . docusate sodium  100 mg Oral BID  . DULoxetine  90 mg Oral Daily  . feeding supplement (GLUCERNA SHAKE)  237 mL Oral Q24H  . ferrous sulfate  325 mg Oral Q breakfast  . furosemide  80 mg Oral BID  . gabapentin  400 mg Oral TID  . insulin aspart  0-15 Units Subcutaneous TID WC  . insulin aspart  8 Units Subcutaneous TID WC  . insulin NPH Human  18 Units Subcutaneous BID AC & HS  . lidocaine  1 patch Transdermal Q24H  . lubiprostone  8 mcg Oral BID WC  . magnesium sulfate 1 - 4 g bolus IVPB  2 g Intravenous Once  . pantoprazole  80 mg Oral Daily  . piperacillin-tazobactam (ZOSYN)  IV  3.375 g Intravenous Q8H  . polyvinyl alcohol  1 drop Both Eyes BID  . sodium chloride flush  10-40 mL Intracatheter Q12H  . sodium chloride flush  3 mL Intravenous Q12H  . tamsulosin  0.4 mg Oral QPC supper  . traZODone  100 mg Oral QHS  . vancomycin  1,250 mg Intravenous Q12H   Continuous Infusions: . sodium chloride 10 mL/hr at 11/05/15 1523  . heparin 1,800 Units/hr (11/06/15 0100)     LOS: 6 days   Florencia Reasons, MD PhD [facility] Office  [phone] Pager - Text Page per Shea Evans as per below:  On-Call/Text Page:      [email or web address]      password [email or web address]  If 7PM-7AM, please contact  night-coverage [email or web address] Password [email or web address] 11/06/2015, 8:02 AM

## 2015-11-06 NOTE — Progress Notes (Addendum)
Pt heart rhythm PVCs, MD notified. VS stable, patient denied SOB and chest pain.

## 2015-11-06 NOTE — Progress Notes (Signed)
   Reviewed images with Dr. Trula Slade and patient will need local wound care to R heel ulcer with protection from further pressure ulceration. Will plan L above knee amputation on Thursday.  Francile Woolford C. Donzetta Matters, MD [facility] [place] Office: [phone] Pager: [phone]

## 2015-11-07 DIAGNOSIS — I482 Chronic atrial fibrillation: Secondary | ICD-10-CM

## 2015-11-07 DIAGNOSIS — M86672 Other chronic osteomyelitis, left ankle and foot: Secondary | ICD-10-CM

## 2015-11-07 LAB — HEPARIN LEVEL (UNFRACTIONATED)
Heparin Unfractionated: 0.1 IU/mL — ABNORMAL LOW (ref 0.30–0.70)
Heparin Unfractionated: 0.17 IU/mL — ABNORMAL LOW (ref 0.30–0.70)
Heparin Unfractionated: <0.1 IU/mL — ABNORMAL LOW (ref 0.30–0.70)

## 2015-11-07 LAB — CBC
HEMATOCRIT: 35.3 % — AB (ref 39.0–52.0)
Hemoglobin: 10.4 g/dL — ABNORMAL LOW (ref 13.0–17.0)
MCH: 21.7 pg — AB (ref 26.0–34.0)
MCHC: 29.5 g/dL — ABNORMAL LOW (ref 30.0–36.0)
MCV: 73.7 fL — AB (ref 78.0–100.0)
Platelets: 151 10*3/uL (ref 150–400)
RBC: 4.79 MIL/uL (ref 4.22–5.81)
RDW: 23.9 % — AB (ref 11.5–15.5)
WBC: 6.8 10*3/uL (ref 4.0–10.5)

## 2015-11-07 LAB — MAGNESIUM: Magnesium: 2 mg/dL (ref 1.7–2.4)

## 2015-11-07 LAB — BASIC METABOLIC PANEL
Anion gap: 6 (ref 5–15)
BUN: 7 mg/dL (ref 6–20)
CALCIUM: 8.6 mg/dL — AB (ref 8.9–10.3)
CHLORIDE: 102 mmol/L (ref 101–111)
CO2: 30 mmol/L (ref 22–32)
CREATININE: 0.67 mg/dL (ref 0.61–1.24)
GFR calc non Af Amer: >60 mL/min (ref >60)
GLUCOSE: 170 mg/dL — AB (ref 65–99)
Potassium: 3.7 mmol/L (ref 3.5–5.1)
Sodium: 138 mmol/L (ref 135–145)

## 2015-11-07 LAB — GLUCOSE, CAPILLARY
GLUCOSE-CAPILLARY: 180 mg/dL — AB (ref 65–99)
GLUCOSE-CAPILLARY: 108 mg/dL — AB (ref 65–99)
Glucose-Capillary: 116 mg/dL — ABNORMAL HIGH (ref 65–99)
Glucose-Capillary: 109 mg/dL — ABNORMAL HIGH (ref 65–99)

## 2015-11-07 LAB — HEPARIN INDUCED PLATELET AB (HIT ANTIBODY): Heparin Induced Plt Ab: 0.494 OD — ABNORMAL HIGH (ref 0.000–0.400)

## 2015-11-07 NOTE — Progress Notes (Signed)
[place] for heparin Indication: atrial fibrillation  Allergies  Allergen Reactions  . Diazepam Anxiety    REACTION: makes patient cry  . Vancomycin Other (See Comments)    "red man syndrome"    Patient Measurements: Height: 5\' 8"  (172.7 cm) Weight: 268 lb 15.4 oz (122 kg) IBW/kg (Calculated) : 68.4 Heparin Dosing Weight: 95kg  Vital Signs: Temp: 98.4 F (36.9 C) (08/16 0838) Temp Source: Oral (08/16 0838) BP: 114/58 (08/16 0838) Pulse Rate: 90 (08/16 0838)  Labs:  Recent Labs  11/05/15 0748 11/06/15 1145 11/07/15 0030 11/07/15 0952  HGB 10.5* 9.6*  --   --   HCT 35.2* 34.4*  --   --   PLT 116* 193  --   --   HEPARINUNFRC  --  <0.10* 0.10* 0.17*  CREATININE 0.65 0.71  --  0.67     Medications:  Infusions:  . sodium chloride 10 mL/hr at 11/05/15 1523  . sodium chloride 50 mL/hr at 11/06/15 1251  . heparin 2,000 Units/hr (11/07/15 0125)    Assessment: 30 yom presented to the ED with syncope. He is on chronic xarelto for history of afib but switched to heparin on admission for procedures. 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 Pharmacy consulted to resume heparin s/p procedure on 8/14. Platelets were trending down and HIT Ab was checked to r/o HIT. However, platelets have trended back up nicely to 193 - likely not HIT. Hg low stable, no bleed or IV line issues per RN.  HL remains subtherapeutic (0.17) after rate increase.  Goal of Therapy:  Heparin level 0.3-0.7 units/ml Monitor platelets by anticoagulation protocol: Yes   Plan:  -Increase heparin to 2300 units/h -6h HL, daily HL/CBC -Monitor s/sx bleeding -AKA for 8/17 - f/u ability to restart Xarelto after AKA/surgeries completed    Elicia Lamp, PharmD, BCPS Clinical Pharmacist Pager [phone] 11/07/2015 11:00 AM

## 2015-11-07 NOTE — Anesthesia Preprocedure Evaluation (Addendum)
Anesthesia Evaluation  Patient identified by MRN, date of birth, ID band Patient awake    Reviewed: Allergy & Precautions, NPO status , Patient's Chart, lab work & pertinent test results  History of Anesthesia Complications Negative for: history of anesthetic complications  Airway Mallampati: II  TM Distance: >3 FB Neck ROM: Full    Dental  (+) Edentulous Upper, Edentulous Lower, Dental Advisory Given   Pulmonary shortness of breath and at rest, sleep apnea and Oxygen sleep apnea , COPD,  oxygen dependent, former smoker (quit 2002),    + rhonchi        Cardiovascular hypertension, Pt. on medications and Pt. on home beta blockers (-) angina+ CAD, + Past MI, + Cardiac Stents, + CABG, + Peripheral Vascular Disease and +CHF   Rhythm:Regular  7/17 ECHO:  EF 30-35%, mod MR   Neuro/Psych PSYCHIATRIC DISORDERS Anxiety Depression  Neuromuscular disease    GI/Hepatic Neg liver ROS, GERD  Medicated,  Endo/Other  diabetes, Insulin DependentMorbid obesity  Renal/GU Renal disease     Musculoskeletal  (+) Arthritis ,   Abdominal   Peds  Hematology  (+) anemia ,   Anesthesia Other Findings   Reproductive/Obstetrics                           Anesthesia Physical Anesthesia Plan  ASA: III  Anesthesia Plan: General   Post-op Pain Management:    Induction: Intravenous  Airway Management Planned: Oral ETT  Additional Equipment: None  Intra-op Plan:   Post-operative Plan: Extubation in OR and Possible Post-op intubation/ventilation  Informed Consent:   Dental advisory given  Plan Discussed with: CRNA and Surgeon  Anesthesia Plan Comments:        Anesthesia Quick Evaluation

## 2015-11-07 NOTE — Progress Notes (Signed)
[place] for heparin Indication: atrial fibrillation  Allergies  Allergen Reactions  . Diazepam Anxiety    REACTION: makes patient cry  . Vancomycin Other (See Comments)    "red man syndrome"    Patient Measurements: Height: 5\' 8"  (172.7 cm) Weight: 268 lb 15.4 oz (122 kg) IBW/kg (Calculated) : 68.4 Heparin Dosing Weight: 95kg  Vital Signs: Temp: 98.5 F (36.9 C) (08/16 1900) Temp Source: Oral (08/16 1900) BP: 110/55 (08/16 1900) Pulse Rate: 100 (08/16 1900)  Labs:  Recent Labs  11/05/15 0748  11/06/15 1145 11/07/15 0030 11/07/15 0952 11/07/15 1730  HGB 10.5*  --  9.6*  --  10.4*  --   HCT 35.2*  --  34.4*  --  35.3*  --   PLT 116*  --  193  --  151  --   HEPARINUNFRC  --   < > <0.10* 0.10* 0.17* <0.10*  CREATININE 0.65  --  0.71  --  0.67  --   < > = values in this interval not displayed.   Medications:  Infusions:  . sodium chloride 10 mL/hr at 11/05/15 1523  . sodium chloride 50 mL/hr at 11/06/15 1251  . heparin 2,300 Units/hr (11/07/15 1706)    Assessment: 53 yom presented to the ED with syncope. He is on chronic xarelto for history of afib but switched to heparin on admission for procedures. 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 Pharmacy consulted to resume heparin s/p procedure on 8/14. Platelets were trending down and HIT Ab was checked to r/o HIT. However, platelets have trended back up nicely to 193 - likely not HIT. Hg low stable, no bleed or IV line issues per RN.  HL still subtherapeutic  Goal of Therapy:  Heparin level 0.3-0.7 units/ml Monitor platelets by anticoagulation protocol: Yes   Plan:  -Increase heparin to 2500 units/h -Daily HL/CBC -Monitor s/sx bleeding -AKA for 8/17 - f/u ability to restart Xarelto after AKA/surgeries completed   Thank you Anette Guarneri, PharmD [phone] 11/07/2015 7:51 PM

## 2015-11-07 NOTE — Progress Notes (Signed)
[facility]                                                                              Patient Demographics  [name], is a 75 y.o. male, DOB - Jun 10, 1940, [id number]  Admit date - 10/31/2015   Admitting Physician Etta Quill, DO  Outpatient Primary MD for the patient is WALDEN,JEFF, MD  Outpatient specialists:   LOS - 7  days    Chief Complaint  Patient presents with  . 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 During that time he was treated with aggressive diuresis and his diuretics were adjusted on discharge. On the day of his readmission he started feeling weak w/ a headache and neck pain. He then passed out while sitting in a chair in his nursing home so he was sent to [facility].   In the ED he was noted to have leukocytosis, hypotension, and acute kidney injury, as well as chronic wounds on his legs and sacral decubitus ulcers. The patient was hypotensive in the ED and required volume resuscitation.   Assessment & Plan   Septic shock / Bilateral foot Osteomyelitis vs Gangrene -Status post angiogram on 8/15 with plan of left AKA on 8/17  Chronic combined systolic and diastolic CHF -EF [id number] via TTE 10/22/15 w/ grade 2 DD  -  no ACEi/ARB for now due to recent acute renal failure , restart low dose coreg - Positive balance of 7.3 L, continue Lasix 80 mg twice a day  HTN -BP has been marginal in face of infection   Chronic atrial fibrillation -Xarelto discontinued  in preparation for surgery  - Continue heparin  drip  Acute renal failure -most likely secondary to septic shock - resolved   COPD -Titrate O2 to maintain SPO2 89-93% - well compensated at this time  DM2 -2/17 A1c 7.4 - CBG currently reasonably controlled   Anxiety/Depression -well controlled at this time   Microcytic Anemia -no evidence of acute blood loss  - Anemia panel consistent with anemia of chronic disease does have component of iron deficiency as well, avoid IV Fe in setting of infection  Morbid obesity  Code Status: DO NOT RESUSCITATE DVT Prophylaxis:  IV heparin Family Communication: Discussed in detail with the patient, all imaging results, lab results explained to the patient   Disposition Plan:   Time Spent in minutes   25 minutes  Procedures:  Angiogram on 8/15  Consultants:   Vascular surgery  Antimicrobials:  Zosyn 8/9 > Vancomycin 8/9 >   Medications  Scheduled Meds: . antiseptic oral rinse  7 mL Mouth Rinse BID  . antiseptic oral rinse  7 mL Mouth Rinse BID  . aspirin EC  81 mg Oral Daily  .  atorvastatin  40 mg Oral q1800  . carvedilol  3.125 mg Oral BID WC  . Chlorhexidine Gluconate Cloth  6 each Topical Q0600  . collagenase   Topical Daily  . cyanocobalamin  1,000 mcg Subcutaneous Daily  . diphenhydrAMINE  25 mg Intravenous Q12H  . docusate sodium  100 mg Oral BID  . DULoxetine  90 mg Oral Daily  . feeding supplement (GLUCERNA SHAKE)  237 mL Oral Q24H  . feeding supplement (PRO-STAT SUGAR FREE 64)  30 mL Oral BID  . ferrous sulfate  325 mg Oral Q breakfast  . furosemide  80 mg Oral BID  . gabapentin  400 mg Oral TID  . insulin aspart  0-15 Units Subcutaneous TID WC  . insulin aspart  8 Units Subcutaneous TID WC  . insulin NPH Human  18 Units Subcutaneous BID AC & HS  . lidocaine  1 patch Transdermal Q24H  . lubiprostone  8 mcg Oral BID WC  . multivitamin with minerals  1 tablet Oral Daily  . mupirocin ointment  1 application Nasal BID  . pantoprazole  80 mg Oral Daily  .  piperacillin-tazobactam (ZOSYN)  IV  3.375 g Intravenous Q8H  . polyvinyl alcohol  1 drop Both Eyes BID  . sodium chloride flush  10-40 mL Intracatheter Q12H  . sodium chloride flush  3 mL Intravenous Q12H  . tamsulosin  0.4 mg Oral QPC supper  . traZODone  100 mg Oral QHS  . vancomycin  1,250 mg Intravenous Q12H   Continuous Infusions: . sodium chloride 10 mL/hr at 11/05/15 1523  . sodium chloride 50 mL/hr at 11/06/15 1251  . heparin 2,000 Units/hr (11/07/15 0125)   PRN Meds:.acetaminophen **OR** acetaminophen, alum & mag hydroxide-simeth, fentaNYL (SUBLIMAZE) injection, guaiFENesin-dextromethorphan, hydrALAZINE, HYDROcodone-acetaminophen, labetalol, LORazepam, metoprolol, ondansetron, phenol, sodium chloride flush   Antibiotics   Anti-infectives    Start     Dose/Rate Route Frequency Ordered Stop   11/02/15 1315  vancomycin (VANCOCIN) 1,250 mg in sodium chloride 0.9 % 250 mL IVPB     1,250 mg 125 mL/hr over 120 Minutes Intravenous Every 12 hours 11/02/15 1305     11/01/15 0100  piperacillin-tazobactam (ZOSYN) IVPB 3.375 g     3.375 g 12.5 mL/hr over 240 Minutes Intravenous Every 8 hours 10/31/15 2133     10/31/15 2200  vancomycin (VANCOCIN) 1,250 mg in sodium chloride 0.9 % 250 mL IVPB  Status:  Discontinued     1,250 mg 125 mL/hr over 120 Minutes Intravenous Every 24 hours 10/31/15 2133 11/02/15 1305   10/31/15 1715  piperacillin-tazobactam (ZOSYN) IVPB 3.375 g     3.375 g 100 mL/hr over 30 Minutes Intravenous  Once 10/31/15 1702 10/31/15 1907   10/31/15 1715  linezolid (ZYVOX) IVPB 600 mg     600 mg 300 mL/hr over 60 Minutes Intravenous  Once 10/31/15 1707 10/31/15 1938        Subjective:   [name] was seen and examined today.  Patient denies dizziness, chest pain, shortness of breath, abdominal pain, N/V/D/C, new weakness, numbess, tingling. No acute events overnight.    Objective:   Vitals:   11/06/15 1848 11/06/15 2010 11/07/15 0503 11/07/15 0838  BP:   110/63 (!) 103/58 (!) 114/58  Pulse:  90 96 90  Resp:  20 20 18   Temp:  98.4 F (36.9 C) 98.3 F (36.8 C) 98.4 F (36.9 C)  TempSrc:  Oral Oral Oral  SpO2:  100% 100% 97%  Weight: 122 kg (268 lb 15.4  oz)     Height:        Intake/Output Summary (Last 24 hours) at 11/07/15 1213 Last data filed at 11/07/15 0500  Gross per 24 hour  Intake          3604.37 ml  Output                0 ml  Net          3604.37 ml     Wt Readings from Last 3 Encounters:  11/06/15 122 kg (268 lb 15.4 oz)  11/05/15 127 kg (280 lb)  10/29/15 117.5 kg (259 lb)     Exam  General: Alert and oriented x 3, NAD  HEENT:    Neck: Supple, no JVD, no masses  Cardiovascular: S1 S2 auscultated, no rubs, murmurs or gallops. Regular rate and rhythm.  Respiratory: Clear to auscultation bilaterally, no wheezing, rales or rhonchi  Gastrointestinal: Soft, nontender, nondistended, + bowel sounds  Ext: Bilateral heel protectors, no significant edema  Neuro:   Skin: No rashes  Psych: Normal affect and demeanor, alert and oriented x3    Data Reviewed:  I have personally reviewed following labs and imaging studies  Micro Results Recent Results (from the past 240 hour(s))  Blood Culture (routine x 2)     Status: None   Collection Time: 10/31/15  6:15 PM  Result Value Ref Range Status   Specimen Description BLOOD LEFT HAND  Final   Special Requests BOTTLES DRAWN AEROBIC AND ANAEROBIC 5ML   Final   Culture NO GROWTH 5 DAYS  Final   Report Status 11/05/2015 FINAL  Final  Blood Culture (routine x 2)     Status: None   Collection Time: 10/31/15  6:30 PM  Result Value Ref Range Status   Specimen Description BLOOD RIGHT HAND  Final   Special Requests BOTTLES DRAWN AEROBIC ONLY 5ML  Final   Culture NO GROWTH 5 DAYS  Final   Report Status 11/05/2015 FINAL  Final  Surgical pcr screen     Status: Abnormal   Collection Time: 11/06/15  4:59 AM  Result Value Ref Range Status   MRSA, PCR POSITIVE (A) NEGATIVE  Final    Comment: RESULT CALLED TO, READ BACK BY AND VERIFIED WITH: S. ROGERS,RN AT 0820 ON [id number] BY S. YARBROUGH    Staphylococcus aureus POSITIVE (A) NEGATIVE Final    Comment:        The Xpert SA Assay (FDA approved for NASAL specimens in patients over 57 years of age), is one component of a comprehensive surveillance program.  Test performance has been validated by [facility] for patients greater than or equal to 72 year old. It is not intended to diagnose infection nor to guide or monitor treatment.     Radiology Reports Dg Chest 2 View  Result Date: 10/19/2015 CLINICAL DATA:  Productive cough. EXAM: CHEST  2 VIEW COMPARISON:  08/24/2014 FINDINGS: Postsurgical changes from CABG is stable. Cardiomediastinal silhouette is mildly enlarged. Mediastinal contours appear intact. There is no evidence of focal airspace consolidation, pleural effusion or pneumothorax. Mild pulmonary vascular congestion. Osseous structures are without acute abnormality. Soft tissues are grossly normal. IMPRESSION: Mildly enlarged cardiac silhouette. Mild pulmonary vascular congestion. Electronically Signed   By: Fidela Salisbury M.D.   On: 10/19/2015 17:29  Ct Head Wo Contrast  Result Date: 10/31/2015 CLINICAL DATA:  On blood thinner.  Headache. EXAM: CT HEAD WITHOUT CONTRAST TECHNIQUE: Contiguous axial images were obtained from the base of the skull  through the vertex without intravenous contrast. COMPARISON:  08/06/2014 FINDINGS: Brain: No evidence of acute infarction, hemorrhage, hydrocephalus, or mass lesion/mass effect. Extensive remote left MCA territory infarction, upper division, extending into the posterior border zone. Remote small vessel infarcts in the bilateral cerebellum. Vascular: Atherosclerotic calcifications Skull: Negative for fracture or focal lesion. Sinuses/Orbits: Bilateral cataract resection. Other: None. IMPRESSION: 1. No acute finding or change compared to prior. 2. Remote left MCA  territory infarction. Remote small bilateral cerebellar infarcts. Electronically Signed   By: Monte Fantasia M.D.   On: 10/31/2015 17:11   Ct Angio Chest Pe W And/or Wo Contrast  Result Date: 10/19/2015 CLINICAL DATA:  Shortness of Breath with productive cough EXAM: CT ANGIOGRAPHY CHEST WITH CONTRAST TECHNIQUE: Multidetector CT imaging of the chest was performed using the standard protocol during bolus administration of intravenous contrast. Multiplanar CT image reconstructions and MIPs were obtained to evaluate the vascular anatomy. CONTRAST:  80 mL Isovue 370. COMPARISON:  None. FINDINGS: Mediastinum/Lymph Nodes: The thoracic inlet is within normal limits. No significant hilar or mediastinal adenopathy is noted. Cardiovascular: Pulmonary artery demonstrates a normal branching pattern. No findings to suggest pulmonary emboli are seen. Aortic calcifications are noted without aneurysmal dilatation or dissection. Changes consistent with coronary bypass grafting are noted. Multiple coronary stents are noted within the bypass grafts. Native coronary calcifications are seen. No right heart strain is noted. No pericardial effusion is seen. Lungs/Pleura: Bilateral pleural effusions right greater than left are seen. Bilateral dependent atelectatic changes are noted. No significant pulmonary edema is noted. Upper abdomen: Visualized upper abdomen is within normal limits. Musculoskeletal: Degenerative changes of the thoracic spine are noted. Review of the MIP images confirms the above findings. IMPRESSION: No evidence of pulmonary emboli. Findings of prior coronary bypass grafting with stenting. No acute abnormality noted. Electronically Signed   By: Inez Catalina M.D.   On: 10/19/2015 21:41  Mr Foot Right Wo Contrast  Result Date: 11/02/2015 CLINICAL DATA:  Soft tissue wound on the heel. EXAM: MRI OF THE RIGHT HIND IN FOOT WITHOUT CONTRAST TECHNIQUE: Multiplanar, multisequence MR imaging was performed. No intravenous  contrast was administered. COMPARISON:  Radiographs dated 10/31/2015 FINDINGS: There is a superficial soft tissue ulceration on the posterior aspect of the heel approximately 5 mm deep. There is slight edema in the adjacent subcutaneous fat. There is no abscess or osteomyelitis. No joint effusions. The achilles tendon at the tendons at the medial, lateral and anterior aspects of the ankle and hindfoot appear normal. IMPRESSION: Superficial soft tissue ulceration of the heel. No underlying abscess or osteomyelitis. Electronically Signed   By: Lorriane Shire M.D.   On: 11/02/2015 08:13   Mr Foot Left Wo Contrast  Result Date: 11/02/2015 CLINICAL DATA:  Diabetic patient with bilateral heel wounds. Septic shock. No known injury. Initial encounter. EXAM: MRI OF THE LEFT FOOT WITHOUT CONTRAST TECHNIQUE: Multiplanar, multisequence MR imaging was performed. No intravenous contrast was administered. COMPARISON:  Plain films left foot 10/31/2015. FINDINGS: A large skin ulceration seen over the dorsal margin of the calcaneus. Marrow edema is seen in the posterior aspect of the calcaneus most notable in the dorsal process and lateral calcaneus. No soft tissue abscess is identified. Mild edema is seen in the distal Achilles tendon eccentric with the medial side. Major ligaments and tendons about the ankle are intact. No joint effusion is identified. A bone small infarct is seen in the lateral talar without fragmentation or collapse. Mild degenerative change at the calcaneocuboid joint is noted. IMPRESSION: Large  skin wound over the heel with marrow edema in the posterior calcaneus subjacent to the wound consistent osteomyelitis. Negative for abscess or septic joint. Small bone infarct lateral talus. Electronically Signed   By: Inge Rise M.D.   On: 11/02/2015 08:16   Dg Chest Port 1 View  Result Date: 10/31/2015 CLINICAL DATA:  Hypotension EXAM: PORTABLE CHEST 1 VIEW COMPARISON:  10/19/2015 FINDINGS: Cardiac shadow  is enlarged. Postsurgical changes are again seen. The lungs are well aerated bilaterally. Mild interstitial changes are again seen without acute abnormality. IMPRESSION: No acute abnormality noted. Electronically Signed   By: Inez Catalina M.D.   On: 10/31/2015 18:01   Dg Foot Complete Left  Result Date: 10/31/2015 CLINICAL DATA:  Wound of bilateral heels for weeks. EXAM: LEFT FOOT - COMPLETE 3+ VIEW COMPARISON:  03/13/2011 MRI FINDINGS: Status post amputation of the great toe the level of the distal metatarsal. There is significant soft tissue swelling in the region of the metatarsophalangeal joints. There is soft tissue swelling superficial to the calcaneus. Additionally, foci of air are identified adjacent to the calcaneus consistent with ulceration. IMPRESSION: 1. Significant soft tissue swelling. 2. Soft tissue ulceration superficial to the calcaneus. 3. Status post great toe amputation. Electronically Signed   By: Nolon Nations M.D.   On: 10/31/2015 22:03   Dg Foot Complete Right  Result Date: 10/31/2015 CLINICAL DATA:  Wounds of bilateral heels for weeks. EXAM: RIGHT FOOT COMPLETE - 3+ VIEW COMPARISON:  None. FINDINGS: Surgical clips are identified in the region the ankle. No acute fracture or subluxation. No cortical erosion. Soft tissue swelling superficial to the calcaneus. IMPRESSION: Soft tissue swelling. Electronically Signed   By: Nolon Nations M.D.   On: 10/31/2015 22:01    Lab Data:  CBC:  Recent Labs Lab 11/02/15 0539 11/03/15 0251 11/04/15 0253 11/05/15 0748 11/06/15 1145 11/07/15 0952  WBC 12.1* 11.4* 8.0 6.0 7.2 6.8  NEUTROABS 9.6* 8.8* 5.7 4.7  --   --   HGB 10.0* 9.3* 9.4* 10.5* 9.6* 10.4*  HCT 33.1* 32.4* 34.0* 35.2* 34.4* 35.3*  MCV 70.4* 72.8* 74.7* 73.0* 74.8* 73.7*  PLT 204 205 158 116* 193 [id number]   Basic Metabolic Panel:  Recent Labs Lab 11/02/15 0539 11/03/15 0251 11/04/15 0253 11/05/15 0748 11/06/15 1145 11/07/15 0952  NA 138 136 138 138 140 138    K 4.1 3.9 3.7 4.3 3.4* 3.7  CL 99* 100* 101 106 103 102  CO2 26 30 26 25  33* 30  GLUCOSE 121* 117* 97 120* 113* 170*  BUN 19 12 7  <5* <5* 7  CREATININE 0.87 0.83 0.70 0.65 0.71 0.67  CALCIUM 8.5* 8.2* 8.3* 8.3* 8.4* 8.6*  MG 2.2 2.1 1.9 1.9  --  2.0   GFR: Estimated Creatinine Clearance: 99.8 mL/min (by C-G formula based on SCr of 0.8 mg/dL). Liver Function Tests:  Recent Labs Lab 11/02/15 0539 11/03/15 0251 11/04/15 0253 11/05/15 0748 11/06/15 1145  AST 15 13* 13* 20 16  ALT 13* 13* 12* 15* 16*  ALKPHOS 52 48 45 48 47  BILITOT 0.8 0.6 0.7 0.6 0.6  PROT 6.1* 5.5* 5.2* 5.4* 5.8*  ALBUMIN 3.2* 3.0* 2.8* 2.9* 2.7*   No results for input(s): LIPASE, AMYLASE in the last 168 hours. No results for input(s): AMMONIA in the last 168 hours. Coagulation Profile: No results for input(s): INR, PROTIME in the last 168 hours. Cardiac Enzymes: No results for input(s): CKTOTAL, CKMB, CKMBINDEX, TROPONINI in the last 168 hours. BNP (last 3 results) No  results for input(s): PROBNP in the last 8760 hours. HbA1C: No results for input(s): HGBA1C in the last 72 hours. CBG:  Recent Labs Lab 11/06/15 1254 11/06/15 1618 11/06/15 2119 11/07/15 0625 11/07/15 1134  GLUCAP 96 180* 97 116* 180*   Lipid Profile: No results for input(s): CHOL, HDL, LDLCALC, TRIG, CHOLHDL, LDLDIRECT in the last 72 hours. Thyroid Function Tests: No results for input(s): TSH, T4TOTAL, FREET4, T3FREE, THYROIDAB in the last 72 hours. Anemia Panel: No results for input(s): VITAMINB12, FOLATE, FERRITIN, TIBC, IRON, RETICCTPCT in the last 72 hours. Urine analysis:    Component Value Date/Time   COLORURINE YELLOW 10/31/2015 1608   APPEARANCEUR CLOUDY (A) 10/31/2015 1608   LABSPEC 1.012 10/31/2015 1608   PHURINE 8.5 (H) 10/31/2015 1608   GLUCOSEU NEGATIVE 10/31/2015 1608   HGBUR NEGATIVE 10/31/2015 1608   HGBUR negative 03/06/2010 1419   BILIRUBINUR NEGATIVE 10/31/2015 1608   KETONESUR NEGATIVE 10/31/2015  1608   PROTEINUR NEGATIVE 10/31/2015 1608   UROBILINOGEN 0.2 09/13/2014 0945   NITRITE NEGATIVE 10/31/2015 1608   LEUKOCYTESUR SMALL (A) 10/31/2015 1608     Sadee Osland M.D. [facility] 11/07/2015, 12:13 PM  Pager: [phone] Between 7am to 7pm - call Pager - [phone]  After 7pm go to [email or web address] - password [email or web address]  Call night coverage person covering after 7pm

## 2015-11-07 NOTE — Progress Notes (Signed)
Pharmacy Antibiotic Note  [name] is a 75 y.o. male admitted on 10/31/2015 with LE ulcers and   osteomyelitis will now require AKA .  Pharmacy has been consulted for vancomycin and zosyn dosing - Day #8. Afeb, wbc wnl. SCr stable wnl. VT therapeutic on current dose 8/13. For L AKA tomorrow.  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) - Zosyn 3.375gm IV Q8H EI - F/u renal fxn, C&S, clinical status, VT prn - L AKA for 8/17 - f/u abx LOT post-procedure  Height: 5\' 8"  (172.7 cm) Weight: 268 lb 15.4 oz (122 kg) IBW/kg (Calculated) : 68.4  Temp (24hrs), Avg:98.3 F (36.8 C), Min:98 F (36.7 C), Max:98.4 F (36.9 C)   Recent Labs Lab 10/31/15 1652 10/31/15 1915  11/01/15 1134 11/01/15 1229 11/02/15 0539 11/02/15 0541 11/03/15 0251 11/04/15 0253 11/04/15 1158 11/05/15 0748 11/06/15 1145  WBC  --   --   < >  --   --  12.1*  --  11.4* 8.0  --  6.0 7.2  CREATININE  --   --   < >  --   --  0.87  --  0.83 0.70  --  0.65 0.71  LATICACIDVEN 3.16* 3.61*  --  1.5 2.2*  --  0.7  --   --   --   --   --   VANCOTROUGH  --   --   --   --   --   --   --   --   --  18  --   --   < > = values in this interval not displayed.  Estimated Creatinine Clearance: 99.8 mL/min (by C-G formula based on SCr of 0.8 mg/dL).    Allergies  Allergen Reactions  . Diazepam Anxiety    REACTION: makes patient cry  . Vancomycin Other (See Comments)    "red man syndrome"    Antimicrobials this admission: Vanc 8/9>> Zosyn 8/9>> Linezolid x 1 8/9  Dose adjustments this admission: 8/11 > increase vancomycin to 1250 mg q 12 hrs  Microbiology results: 8/9 BCx: neg  Elicia Lamp, PharmD, [facility] Clinical Pharmacist Pager [phone] 11/07/2015 9:58 AM

## 2015-11-07 NOTE — Progress Notes (Signed)
Unfractionated Heparin level of 0.10 resulted at 0030. Per pharmacy dosing, heparin infusion rate increased to 2000 units/hr.  2nd RN verification by Teola Bradley.  Next heparin level at 0900.

## 2015-11-07 NOTE — Progress Notes (Addendum)
  Progress Note    11/07/2015 8:04 AM 1 Day Post-Op  Subjective:  No complaints  Afebrile HR 80's-90's NSR 0000000 systolic [id number] 0000000  Vitals:   11/06/15 2010 11/07/15 0503  BP: 110/63 (!) 103/58  Pulse: 90 96  Resp: 20 20  Temp: 98.4 F (36.9 C) 98.3 F (36.8 C)    Physical Exam: Extremities:  Bilateral boots in place; right heel resting on edge of boot. (boot repositioned.   CBC    Component Value Date/Time   WBC 7.2 11/06/2015 1145   RBC 4.60 11/06/2015 1145   HGB 9.6 (L) 11/06/2015 1145   HCT 34.4 (L) 11/06/2015 1145   PLT 193 11/06/2015 1145   MCV 74.8 (L) 11/06/2015 1145   MCH 20.9 (L) 11/06/2015 1145   MCHC 27.9 (L) 11/06/2015 1145   RDW 23.7 (H) 11/06/2015 1145   LYMPHSABS 0.8 11/05/2015 0748   MONOABS 0.4 11/05/2015 0748   EOSABS 0.1 11/05/2015 0748   BASOSABS 0.0 11/05/2015 0748    BMET    Component Value Date/Time   NA 140 11/06/2015 1145   NA 136 (A) 07/26/2015   K 3.4 (L) 11/06/2015 1145   CL 103 11/06/2015 1145   CO2 33 (H) 11/06/2015 1145   GLUCOSE 113 (H) 11/06/2015 1145   BUN <5 (L) 11/06/2015 1145   BUN 37 (A) 07/26/2015   CREATININE 0.71 11/06/2015 1145   CREATININE 1.11 06/14/2013 1221   CALCIUM 8.4 (L) 11/06/2015 1145   GFRNONAA >60 11/06/2015 1145   GFRAA >60 11/06/2015 1145    INR    Component Value Date/Time   INR 1.39 10/19/2015 1833     Intake/Output Summary (Last 24 hours) at 11/07/15 0804 Last data filed at 11/07/15 0500  Gross per 24 hour  Intake          3604.37 ml  Output                0 ml  Net          3604.37 ml     Assessment:  75 y.o. male is s/p:  Procedure Performed:                       1.  Ultrasound-guided access left femoral artery                       2.  Abdominal aortogram                       3.  Bilateral lower extremity runoff                       4.  Second order catheterization                       5.  Conscious sedation (45 minutes)  1 Day Post-Op  Plan: -pt's boot  repositioned as his heel was laying on the edge of the boot.  -dressings not removed as he is eating breakfast -for left AKA tomorrow-will pre-op -hold heparin on call to [place], PA-C [facility] [phone] 11/07/2015 8:04 AM   No revascularization options for RLE with left heel ulcer that led to sepsis. OR tomorrow for Left AKA and minimal debridement of R ulceration.   Gayland Nicol C. Donzetta Matters, MD [facility] [place] Office: [phone] Pager: [phone]

## 2015-11-07 NOTE — Progress Notes (Signed)
ANTICOAGULATION CONSULT NOTE - Follow Up Consult  Pharmacy Consult for Heparin (Xarelto on hold) Indication: atrial fibrillation  Allergies  Allergen Reactions  . Diazepam Anxiety    REACTION: makes patient cry  . Vancomycin Other (See Comments)    "red man syndrome"    Patient Measurements: Height: 5\' 8"  (172.7 cm) Weight: 268 lb 15.4 oz (122 kg) IBW/kg (Calculated) : 68.4  Vital Signs: Temp: 98.4 F (36.9 C) (08/15 2010) Temp Source: Oral (08/15 2010) BP: 110/63 (08/15 2010) Pulse Rate: 90 (08/15 2010)  Labs:  Recent Labs  11/04/15 0253 11/05/15 0748 11/06/15 1145 11/07/15 0030  HGB 9.4* 10.5* 9.6*  --   HCT 34.0* 35.2* 34.4*  --   PLT 158 116* 193  --   HEPARINUNFRC 0.39  --  <0.10* 0.10*  CREATININE 0.70 0.65 0.71  --     Estimated Creatinine Clearance: 99.8 mL/min (by C-G formula based on SCr of 0.8 mg/dL).   Assessment: Heparin while Xarelto on hold for procedures, HL is sub-therapeutic after re-start s/p aortogram. No issues per RN.   Goal of Therapy:  Heparin level 0.3-0.7 units/ml Monitor platelets by anticoagulation protocol: Yes   Plan:  -Increase heparin to 2000 units/hr -0900 HL  Tyqwan Pink 11/07/2015,1:10 AM

## 2015-11-08 ENCOUNTER — Encounter (HOSPITAL_COMMUNITY)
Admission: EM | Disposition: A | Discharge status: Skilled Nursing Facility | Payer: Self-pay | Source: Home / Self Care | Attending: Internal Medicine

## 2015-11-08 ENCOUNTER — Inpatient Hospital Stay (HOSPITAL_COMMUNITY): Payer: Medicare Other | Admitting: Anesthesiology

## 2015-11-08 ENCOUNTER — Encounter (HOSPITAL_COMMUNITY): Payer: Self-pay | Admitting: Certified Registered Nurse Anesthetist

## 2015-11-08 DIAGNOSIS — I70262 Atherosclerosis of native arteries of extremities with gangrene, left leg: Secondary | ICD-10-CM

## 2015-11-08 HISTORY — PX: AMPUTATION: SHX166

## 2015-11-08 LAB — GLUCOSE, CAPILLARY
GLUCOSE-CAPILLARY: 117 mg/dL — AB (ref 65–99)
GLUCOSE-CAPILLARY: 151 mg/dL — AB (ref 65–99)
GLUCOSE-CAPILLARY: 138 mg/dL — AB (ref 65–99)
Glucose-Capillary: 139 mg/dL — ABNORMAL HIGH (ref 65–99)
Glucose-Capillary: 132 mg/dL — ABNORMAL HIGH (ref 65–99)

## 2015-11-08 LAB — CBC
HEMATOCRIT: 34.4 % — AB (ref 39.0–52.0)
HEMOGLOBIN: 9.9 g/dL — AB (ref 13.0–17.0)
MCH: 21.6 pg — ABNORMAL LOW (ref 26.0–34.0)
MCHC: 28.8 g/dL — ABNORMAL LOW (ref 30.0–36.0)
MCV: 74.9 fL — ABNORMAL LOW (ref 78.0–100.0)
Platelets: 158 10*3/uL (ref 150–400)
RBC: 4.59 MIL/uL (ref 4.22–5.81)
RDW: 24 % — ABNORMAL HIGH (ref 11.5–15.5)
WBC: 8.3 10*3/uL (ref 4.0–10.5)

## 2015-11-08 LAB — BASIC METABOLIC PANEL
ANION GAP: 8 (ref 5–15)
BUN: 9 mg/dL (ref 6–20)
CALCIUM: 8.6 mg/dL — AB (ref 8.9–10.3)
CO2: 35 mmol/L — AB (ref 22–32)
Chloride: 96 mmol/L — ABNORMAL LOW (ref 101–111)
Creatinine, Ser: 0.78 mg/dL (ref 0.61–1.24)
GLUCOSE: 123 mg/dL — AB (ref 65–99)
POTASSIUM: 3.1 mmol/L — AB (ref 3.5–5.1)
Sodium: 139 mmol/L (ref 135–145)

## 2015-11-08 LAB — HEPARIN LEVEL (UNFRACTIONATED)
HEPARIN UNFRACTIONATED: 0.27 [IU]/mL — AB (ref 0.30–0.70)
Heparin Unfractionated: 0.59 IU/mL (ref 0.30–0.70)

## 2015-11-08 SURGERY — AMPUTATION, ABOVE KNEE
Anesthesia: General | Site: Leg Upper | Laterality: Left

## 2015-11-08 MED ORDER — PROPOFOL 10 MG/ML IV BOLUS
INTRAVENOUS | Status: DC | PRN
  Administered 2015-11-08: 100 mg via INTRAVENOUS

## 2015-11-08 MED ORDER — ACETAMINOPHEN 325 MG PO TABS: 325.0000 mg | ORAL_TABLET | ORAL | Status: DC | PRN

## 2015-11-08 MED ORDER — OXYCODONE HCL 5 MG PO TABS: 5.0000 mg | ORAL_TABLET | Freq: Once | ORAL | Status: DC | PRN

## 2015-11-08 MED ORDER — PHENYLEPHRINE HCL 10 MG/ML IJ SOLN
INTRAMUSCULAR | Status: DC | PRN
  Administered 2015-11-08 (×3): 80 ug via INTRAVENOUS
  Administered 2015-11-08: 120 ug via INTRAVENOUS

## 2015-11-08 MED ORDER — ACETAMINOPHEN 160 MG/5ML PO SOLN
325.0000 mg | ORAL | Status: DC | PRN
  Filled 2015-11-08: qty 20.3

## 2015-11-08 MED ORDER — MIDAZOLAM HCL 2 MG/2ML IJ SOLN
INTRAMUSCULAR | Status: DC | PRN
  Administered 2015-11-06: 1 mg via INTRAVENOUS

## 2015-11-08 MED ORDER — FENTANYL CITRATE (PF) 100 MCG/2ML IJ SOLN
INTRAMUSCULAR | Status: DC | PRN
  Administered 2015-11-08: 50 ug via INTRAVENOUS
  Administered 2015-11-08: 100 ug via INTRAVENOUS
  Administered 2015-11-08: 50 ug via INTRAVENOUS

## 2015-11-08 MED ORDER — PHENYLEPHRINE 40 MCG/ML (10ML) SYRINGE FOR IV PUSH (FOR BLOOD PRESSURE SUPPORT)
PREFILLED_SYRINGE | INTRAVENOUS | Status: AC
  Filled 2015-11-08: qty 10

## 2015-11-08 MED ORDER — OXYCODONE HCL 5 MG/5ML PO SOLN: 5.0000 mg | Freq: Once | ORAL | Status: DC | PRN

## 2015-11-08 MED ORDER — ONDANSETRON HCL 4 MG/2ML IJ SOLN
INTRAMUSCULAR | Status: AC
  Filled 2015-11-08: qty 2

## 2015-11-08 MED ORDER — HEPARIN (PORCINE) IN NACL 100-0.45 UNIT/ML-% IJ SOLN
2700.0000 [IU]/h | INTRAMUSCULAR | Status: DC
  Administered 2015-11-08 – 2015-11-09 (×3): 2700 [IU]/h via INTRAVENOUS
  Filled 2015-11-08 (×3): qty 250

## 2015-11-08 MED ORDER — OXYCODONE-ACETAMINOPHEN 5-325 MG PO TABS
1.0000 | ORAL_TABLET | ORAL | Status: DC | PRN
  Administered 2015-11-08 – 2015-11-10 (×6): 2 via ORAL
  Filled 2015-11-08 (×6): qty 2

## 2015-11-08 MED ORDER — FENTANYL CITRATE (PF) 100 MCG/2ML IJ SOLN
INTRAMUSCULAR | Status: AC
  Filled 2015-11-08: qty 4

## 2015-11-08 MED ORDER — LIDOCAINE 2% (20 MG/ML) 5 ML SYRINGE
INTRAMUSCULAR | Status: AC
  Filled 2015-11-08: qty 5

## 2015-11-08 MED ORDER — MIDAZOLAM HCL 2 MG/2ML IJ SOLN
INTRAMUSCULAR | Status: AC
  Filled 2015-11-08: qty 2

## 2015-11-08 MED ORDER — SUCCINYLCHOLINE CHLORIDE 200 MG/10ML IV SOSY
PREFILLED_SYRINGE | INTRAVENOUS | Status: AC
  Filled 2015-11-08: qty 10

## 2015-11-08 MED ORDER — PHENYLEPHRINE HCL 10 MG/ML IJ SOLN
INTRAMUSCULAR | Status: DC | PRN
  Administered 2015-11-08: 40 ug/min via INTRAVENOUS

## 2015-11-08 MED ORDER — SODIUM CHLORIDE 0.9 % IV SOLN
INTRAVENOUS | Status: DC
  Administered 2015-11-08: 11:00:00 via INTRAVENOUS

## 2015-11-08 MED ORDER — SUCCINYLCHOLINE CHLORIDE 20 MG/ML IJ SOLN
INTRAMUSCULAR | Status: DC | PRN
  Administered 2015-11-08: 80 mg via INTRAVENOUS

## 2015-11-08 MED ORDER — ONDANSETRON HCL 4 MG/2ML IJ SOLN
INTRAMUSCULAR | Status: DC | PRN
  Administered 2015-11-08: 4 mg via INTRAVENOUS

## 2015-11-08 MED ORDER — FENTANYL CITRATE (PF) 100 MCG/2ML IJ SOLN: 25.0000 ug | INTRAMUSCULAR | Status: DC | PRN

## 2015-11-08 MED ORDER — PROPOFOL 10 MG/ML IV BOLUS
INTRAVENOUS | Status: AC
  Filled 2015-11-08: qty 20

## 2015-11-08 MED ORDER — 0.9 % SODIUM CHLORIDE (POUR BTL) OPTIME
TOPICAL | Status: DC | PRN
  Administered 2015-11-08: 1000 mL

## 2015-11-08 MED ORDER — LIDOCAINE HCL (CARDIAC) 20 MG/ML IV SOLN
INTRAVENOUS | Status: DC | PRN
  Administered 2015-11-08: 100 mg via INTRAVENOUS

## 2015-11-08 MED FILL — Midazolam HCl Inj 2 MG/2ML (Base Equivalent): INTRAMUSCULAR | Qty: 2 | Status: AC

## 2015-11-08 SURGICAL SUPPLY — 47 items
BANDAGE ELASTIC 4 VELCRO ST LF (GAUZE/BANDAGES/DRESSINGS) ×3 IMPLANT
BANDAGE ELASTIC 6 VELCRO ST LF (GAUZE/BANDAGES/DRESSINGS) ×3 IMPLANT
BNDG COHESIVE 6X5 TAN STRL LF (GAUZE/BANDAGES/DRESSINGS) ×3 IMPLANT
BNDG GAUZE ELAST 4 BULKY (GAUZE/BANDAGES/DRESSINGS) ×6 IMPLANT
CANISTER SUCTION 2500CC (MISCELLANEOUS) ×3 IMPLANT
CLIP TI MEDIUM 6 (CLIP) ×3 IMPLANT
COVER SURGICAL LIGHT HANDLE (MISCELLANEOUS) ×3 IMPLANT
DRAIN CHANNEL 19F RND (DRAIN) IMPLANT
DRAPE ORTHO SPLIT 77X108 STRL (DRAPES) ×4
DRAPE PROXIMA HALF (DRAPES) ×3 IMPLANT
DRAPE SURG ORHT 6 SPLT 77X108 (DRAPES) ×2 IMPLANT
DRSG ADAPTIC 3X8 NADH LF (GAUZE/BANDAGES/DRESSINGS) ×3 IMPLANT
ELECT REM PT RETURN 9FT ADLT (ELECTROSURGICAL) ×3
ELECTRODE REM PT RTRN 9FT ADLT (ELECTROSURGICAL) ×1 IMPLANT
EVACUATOR SILICONE 100CC (DRAIN) IMPLANT
GAUZE SPONGE 4X4 12PLY STRL (GAUZE/BANDAGES/DRESSINGS) ×6 IMPLANT
GAUZE SPONGE 4X4 16PLY XRAY LF (GAUZE/BANDAGES/DRESSINGS) ×3 IMPLANT
GLOVE BIO SURGEON STRL SZ7.5 (GLOVE) ×3 IMPLANT
GLOVE BIOGEL PI IND STRL 7.5 (GLOVE) ×1 IMPLANT
GLOVE BIOGEL PI INDICATOR 7.5 (GLOVE) ×2
GLOVE ECLIPSE 7.0 STRL STRAW (GLOVE) ×3 IMPLANT
GLOVE SURG SS PI 7.0 STRL IVOR (GLOVE) ×6 IMPLANT
GOWN STRL REUS W/ TWL LRG LVL3 (GOWN DISPOSABLE) ×2 IMPLANT
GOWN STRL REUS W/ TWL XL LVL3 (GOWN DISPOSABLE) ×3 IMPLANT
GOWN STRL REUS W/TWL LRG LVL3 (GOWN DISPOSABLE) ×4
GOWN STRL REUS W/TWL XL LVL3 (GOWN DISPOSABLE) ×6
KIT BASIN OR (CUSTOM PROCEDURE TRAY) ×3 IMPLANT
KIT ROOM TURNOVER OR (KITS) ×3 IMPLANT
NS IRRIG 1000ML POUR BTL (IV SOLUTION) ×3 IMPLANT
PACK GENERAL/GYN (CUSTOM PROCEDURE TRAY) ×3 IMPLANT
PAD ARMBOARD 7.5X6 YLW CONV (MISCELLANEOUS) ×6 IMPLANT
SAW GIGLI STERILE 20 (MISCELLANEOUS) ×3 IMPLANT
SPONGE GAUZE 4X4 12PLY STER LF (GAUZE/BANDAGES/DRESSINGS) ×6 IMPLANT
STAPLER VISISTAT 35W (STAPLE) ×3 IMPLANT
STOCKINETTE IMPERVIOUS LG (DRAPES) ×3 IMPLANT
SUT ETHILON 3 0 PS 1 (SUTURE) IMPLANT
SUT SILK 0 TIES 10X30 (SUTURE) ×3 IMPLANT
SUT SILK 2 0 (SUTURE)
SUT SILK 2-0 18XBRD TIE 12 (SUTURE) IMPLANT
SUT SILK 3 0 (SUTURE)
SUT SILK 3-0 18XBRD TIE 12 (SUTURE) IMPLANT
SUT VIC AB 2-0 CT1 18 (SUTURE) ×12 IMPLANT
TAPE UMBILICAL COTTON 1/8X30 (MISCELLANEOUS) ×3 IMPLANT
TOWEL OR 17X24 6PK STRL BLUE (TOWEL DISPOSABLE) ×3 IMPLANT
TOWEL OR 17X26 10 PK STRL BLUE (TOWEL DISPOSABLE) ×3 IMPLANT
UNDERPAD 30X30 INCONTINENT (UNDERPADS AND DIAPERS) ×3 IMPLANT
WATER STERILE IRR 1000ML POUR (IV SOLUTION) ×3 IMPLANT

## 2015-11-08 NOTE — Anesthesia Procedure Notes (Signed)
Procedure Name: Intubation Date/Time: 11/08/2015 7:44 AM Performed by: Ollen Bowl Pre-anesthesia Checklist: Patient identified, Emergency Drugs available, Suction available, Patient being monitored and Timeout performed Patient Re-evaluated:Patient Re-evaluated prior to inductionOxygen Delivery Method: Circle system utilized and Simple face mask Preoxygenation: Pre-oxygenation with 100% oxygen Intubation Type: IV induction Ventilation: Mask ventilation without difficulty and Oral airway inserted - appropriate to patient size Laryngoscope Size: Sabra Heck and 3 Grade View: Grade I Tube type: Oral Tube size: 7.5 mm Number of attempts: 1 Airway Equipment and Method: Patient positioned with wedge pillow and Stylet Placement Confirmation: ETT inserted through vocal cords under direct vision,  positive ETCO2 and breath sounds checked- equal and bilateral Secured at: 21 cm Tube secured with: Tape Dental Injury: Teeth and Oropharynx as per pre-operative assessment

## 2015-11-08 NOTE — Op Note (Signed)
    OPERATIVE NOTE   PROCEDURE: Left above-the-knee amputation  PRE-OPERATIVE DIAGNOSIS: left foot gangrene  POST-OPERATIVE DIAGNOSIS: same as above  SURGEON: Molly Savarino C. Donzetta Matters, MD  ASSISTANT(S): Gerri Lins, PA  ANESTHESIA: general  ESTIMATED BLOOD LOSS: 100 cc  FINDING(S): Patent vein bypass, excellent bleeding  SPECIMEN(S):  left above-the-knee amputation  INDICATIONS:   RICKYE CHAI is a 75 y.o. male who presents with leftheel gangrene.  The patient is scheduled for a left above-the-knee amputation.  I discussed in depth with the patient the risks, benefits, and alternatives to this procedure.  The patient is aware that the risk of this operation included but are not limited to:  bleeding, infection, myocardial infarction, stroke, death, failure to heal amputation wound, and possible need for more proximal amputation and likely he will not walk again given it has been 1 year since he last ambulated.  The patient is aware of the risks and agrees proceed forward with the procedure.  DESCRIPTION: After full informed written consent was obtained from the patient, the patient was brought back to the operating room, and placed supine upon the operating table.  He is scheduled antibiotics. The patient was then prepped and draped in the standard fashion for an above-the-knee amputation.   After obtaining adequate anesthesia, the patient was prepped and draped in the standard fashion for a above-the-knee amputation.  I marked out the anterior and posterior flaps for a fish-mouth type of amputation.   I made the incisions for these flaps, and then dissected through the subcutaneous tissue, fascia, and muscles circumferentially. The previous vein bypass was identified and ligated between ties. The neurovascular bundle was clamped and divided and suture ligated.  I elevated  the periosteal tissue 4 cm more proximal than the anterior skin flap.  I then transected the femur with gigli saw.   Then I smoothed out the rough edges of the bone with a rasp. The leg was removed with amputation knife and was passed off the field as the above-the-knee amputation.  At this point, I clamped all visibly bleeding arteries and veins using a combination of suture ligation with Vicryl suture and electrocautery.  The sciatic nerve was stretched, tied with vicryl suture and divided.  The stump was washed off with sterile normal saline and no further active bleeding was noted.  I reapproximated the anterior and posterior fascia  with interrupted stitches of 2-0 Vicryl.  This was completed along the entire length of anterior and posterior fascia until there were no more loose space in the fascial line.  The skin was then  reapproximated with staples.  The stump was washed off and dried.  The incision was dressed with [employer] and  then fluffs were applied.  Kerlix was wrapped around the leg and then gently an ACE wrap was applied.     COMPLICATIONS: none  CONDITION: fair   Dillard Pascal C. Donzetta Matters, MD [facility] Office: [phone] Pager: [phone]  11/08/2015, 9:10 AM

## 2015-11-08 NOTE — Progress Notes (Signed)
[facility]                                                                              Patient Demographics  [name], is a 75 y.o. male, DOB - [birth year], [id number]  Admit date - 10/31/2015   Admitting Physician Etta Quill, DO  Outpatient Primary MD for the patient is WALDEN,JEFF, MD  Outpatient specialists:   LOS - 8  days    Chief Complaint  Patient presents with  . 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 During that time he was treated with aggressive diuresis and his diuretics were adjusted on discharge. On the day of his readmission he started feeling weak w/ a headache and neck pain. He then passed out while sitting in a chair in his nursing home so he was sent to [facility].   In the ED he was noted to have leukocytosis, hypotension, and acute kidney injury, as well as chronic wounds on his legs and sacral decubitus ulcers. The patient was hypotensive in the ED and required volume resuscitation.   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   Chronic combined systolic and diastolic CHF -EF [id number] via TTE 10/22/15 w/ grade 2 DD  -  no ACEi/ARB for now due to recent acute renal failure , restart low dose coreg - Positive balance of 6.7 L, continue Lasix 80 mg twice a day  HTN -BP has been marginal in face of infection   Chronic atrial fibrillation -Xarelto discontinued  in preparation for surgery  - Continue heparin  drip  Acute renal failure -most likely secondary to septic shock - resolved   COPD -Titrate O2 to maintain SPO2 89-93% - well compensated at this time  DM2 -2/17 A1c 7.4 - CBG currently reasonably controlled   Anxiety/Depression -well controlled at this time   Microcytic Anemia -no evidence of acute blood loss  - Anemia panel consistent with anemia of chronic disease does have component of iron deficiency as well, avoid IV Fe in setting of infection  Morbid obesity  Code Status: DO NOT RESUSCITATE DVT Prophylaxis:  IV heparin Family Communication: Discussed in detail with the patient, all imaging results, lab results explained to the patient   Disposition Plan:   Time Spent in minutes   25 minutes  Procedures:  Angiogram on 8/15  Consultants:   Vascular surgery  Antimicrobials:  Zosyn 8/9 > Vancomycin 8/9 >   Medications  Scheduled Meds: . antiseptic oral rinse  7 mL Mouth Rinse BID  . antiseptic oral rinse  7 mL Mouth Rinse BID  . aspirin EC  81 mg Oral Daily  .  atorvastatin  40 mg Oral q1800  . carvedilol  3.125 mg Oral BID WC  . Chlorhexidine Gluconate Cloth  6 each Topical Q0600  . collagenase   Topical Daily  . diphenhydrAMINE  25 mg Intravenous Q12H  . docusate sodium  100 mg Oral BID  . DULoxetine  90 mg Oral Daily  . feeding supplement (GLUCERNA SHAKE)  237 mL Oral Q24H  . feeding supplement (PRO-STAT SUGAR FREE 64)  30 mL Oral BID  . ferrous sulfate  325 mg Oral Q breakfast  . furosemide  80 mg Oral BID  . gabapentin  400 mg Oral TID  . insulin aspart  0-15 Units Subcutaneous TID WC  . insulin aspart  8 Units Subcutaneous TID WC  . insulin NPH Human  18 Units Subcutaneous BID AC & HS  . lidocaine  1 patch Transdermal Q24H  . lubiprostone  8 mcg Oral BID WC  . multivitamin with minerals  1 tablet Oral Daily  . mupirocin ointment  1 application Nasal BID  . pantoprazole  80 mg Oral Daily  . piperacillin-tazobactam (ZOSYN)  IV  3.375 g  Intravenous Q8H  . polyvinyl alcohol  1 drop Both Eyes BID  . sodium chloride flush  10-40 mL Intracatheter Q12H  . sodium chloride flush  3 mL Intravenous Q12H  . tamsulosin  0.4 mg Oral QPC supper  . traZODone  100 mg Oral QHS  . vancomycin  1,250 mg Intravenous Q12H   Continuous Infusions: . sodium chloride 10 mL/hr at 11/05/15 1523  . sodium chloride 50 mL/hr at 11/08/15 1115  . heparin 2,700 Units/hr (11/08/15 1116)   PRN Meds:.acetaminophen **OR** acetaminophen, alum & mag hydroxide-simeth, fentaNYL (SUBLIMAZE) injection, guaiFENesin-dextromethorphan, hydrALAZINE, labetalol, LORazepam, metoprolol, ondansetron, oxyCODONE-acetaminophen, phenol, sodium chloride flush   Antibiotics   Anti-infectives    Start     Dose/Rate Route Frequency Ordered Stop   11/02/15 1315  vancomycin (VANCOCIN) 1,250 mg in sodium chloride 0.9 % 250 mL IVPB     1,250 mg 125 mL/hr over 120 Minutes Intravenous Every 12 hours 11/02/15 1305     11/01/15 0100  piperacillin-tazobactam (ZOSYN) IVPB 3.375 g     3.375 g 12.5 mL/hr over 240 Minutes Intravenous Every 8 hours 10/31/15 2133     10/31/15 2200  vancomycin (VANCOCIN) 1,250 mg in sodium chloride 0.9 % 250 mL IVPB  Status:  Discontinued     1,250 mg 125 mL/hr over 120 Minutes Intravenous Every 24 hours 10/31/15 2133 11/02/15 1305   10/31/15 1715  piperacillin-tazobactam (ZOSYN) IVPB 3.375 g     3.375 g 100 mL/hr over 30 Minutes Intravenous  Once 10/31/15 1702 10/31/15 1907   10/31/15 1715  linezolid (ZYVOX) IVPB 600 mg     600 mg 300 mL/hr over 60 Minutes Intravenous  Once 10/31/15 1707 10/31/15 1938        Subjective:   [name] was seen and examined today.  See after surgery today, no complaints. Patient denies dizziness, chest pain, shortness of breath, abdominal pain, N/V/D/C, new weakness, numbess, tingling. No acute events overnight.    Objective:   Vitals:   11/08/15 0937 11/08/15 0952 11/08/15 1005 11/08/15 1358  BP: 109/61 (!)  99/53 (!) 99/56 (!) 112/44  Pulse: (!) 110 (!) 104 (!) 104 (!) 102  Resp: 19 20 19 18   Temp:   98.4 F (36.9 C) 98.8 F (37.1 C)  TempSrc:    Oral  SpO2: 94% 93% 96% 91%  Weight:  Height:        Intake/Output Summary (Last 24 hours) at 11/08/15 1359 Last data filed at 11/08/15 1314  Gross per 24 hour  Intake          2387.08 ml  Output             3575 ml  Net         -1187.92 ml     Wt Readings from Last 3 Encounters:  11/08/15 118 kg (260 lb 2.3 oz)  11/05/15 127 kg (280 lb)  10/29/15 117.5 kg (259 lb)     Exam  General: Alert and oriented x 3, NAD  HEENT:    Neck: Supple, no JVD, no masses  Cardiovascular: S1 S2 auscultated, no rubs, murmurs or gallops. Regular rate and rhythm.  Respiratory: Clear to auscultation bilaterally, no wheezing, rales or rhonchi  Gastrointestinal: Soft, nontender, nondistended, + bowel sounds  Ext: Left AKA   Neuro:   Skin: No rashes  Psych: Normal affect and demeanor, alert and oriented x3    Data Reviewed:  I have personally reviewed following labs and imaging studies  Micro Results Recent Results (from the past 240 hour(s))  Blood Culture (routine x 2)     Status: None   Collection Time: 10/31/15  6:15 PM  Result Value Ref Range Status   Specimen Description BLOOD LEFT HAND  Final   Special Requests BOTTLES DRAWN AEROBIC AND ANAEROBIC 5ML   Final   Culture NO GROWTH 5 DAYS  Final   Report Status 11/05/2015 FINAL  Final  Blood Culture (routine x 2)     Status: None   Collection Time: 10/31/15  6:30 PM  Result Value Ref Range Status   Specimen Description BLOOD RIGHT HAND  Final   Special Requests BOTTLES DRAWN AEROBIC ONLY 5ML  Final   Culture NO GROWTH 5 DAYS  Final   Report Status 11/05/2015 FINAL  Final  Surgical pcr screen     Status: Abnormal   Collection Time: 11/06/15  4:59 AM  Result Value Ref Range Status   MRSA, PCR POSITIVE (A) NEGATIVE Final    Comment: RESULT CALLED TO, READ BACK BY AND VERIFIED  WITH: S. ROGERS,RN AT 0820 ON [id number] BY S. YARBROUGH    Staphylococcus aureus POSITIVE (A) NEGATIVE Final    Comment:        The Xpert SA Assay (FDA approved for NASAL specimens in patients over 60 years of age), is one component of a comprehensive surveillance program.  Test performance has been validated by [facility] for patients greater than or equal to 33 year old. It is not intended to diagnose infection nor to guide or monitor treatment.     Radiology Reports Dg Chest 2 View  Result Date: 10/19/2015 CLINICAL DATA:  Productive cough. EXAM: CHEST  2 VIEW COMPARISON:  08/24/2014 FINDINGS: Postsurgical changes from CABG is stable. Cardiomediastinal silhouette is mildly enlarged. Mediastinal contours appear intact. There is no evidence of focal airspace consolidation, pleural effusion or pneumothorax. Mild pulmonary vascular congestion. Osseous structures are without acute abnormality. Soft tissues are grossly normal. IMPRESSION: Mildly enlarged cardiac silhouette. Mild pulmonary vascular congestion. Electronically Signed   By: Fidela Salisbury M.D.   On: 10/19/2015 17:29  Ct Head Wo Contrast  Result Date: 10/31/2015 CLINICAL DATA:  On blood thinner.  Headache. EXAM: CT HEAD WITHOUT CONTRAST TECHNIQUE: Contiguous axial images were obtained from the base of the skull through the vertex without intravenous contrast. COMPARISON:  08/06/2014 FINDINGS: Brain: No  evidence of acute infarction, hemorrhage, hydrocephalus, or mass lesion/mass effect. Extensive remote left MCA territory infarction, upper division, extending into the posterior border zone. Remote small vessel infarcts in the bilateral cerebellum. Vascular: Atherosclerotic calcifications Skull: Negative for fracture or focal lesion. Sinuses/Orbits: Bilateral cataract resection. Other: None. IMPRESSION: 1. No acute finding or change compared to prior. 2. Remote left MCA territory infarction. Remote small bilateral cerebellar  infarcts. Electronically Signed   By: Monte Fantasia M.D.   On: 10/31/2015 17:11   Ct Angio Chest Pe W And/or Wo Contrast  Result Date: 10/19/2015 CLINICAL DATA:  Shortness of Breath with productive cough EXAM: CT ANGIOGRAPHY CHEST WITH CONTRAST TECHNIQUE: Multidetector CT imaging of the chest was performed using the standard protocol during bolus administration of intravenous contrast. Multiplanar CT image reconstructions and MIPs were obtained to evaluate the vascular anatomy. CONTRAST:  80 mL Isovue 370. COMPARISON:  None. FINDINGS: Mediastinum/Lymph Nodes: The thoracic inlet is within normal limits. No significant hilar or mediastinal adenopathy is noted. Cardiovascular: Pulmonary artery demonstrates a normal branching pattern. No findings to suggest pulmonary emboli are seen. Aortic calcifications are noted without aneurysmal dilatation or dissection. Changes consistent with coronary bypass grafting are noted. Multiple coronary stents are noted within the bypass grafts. Native coronary calcifications are seen. No right heart strain is noted. No pericardial effusion is seen. Lungs/Pleura: Bilateral pleural effusions right greater than left are seen. Bilateral dependent atelectatic changes are noted. No significant pulmonary edema is noted. Upper abdomen: Visualized upper abdomen is within normal limits. Musculoskeletal: Degenerative changes of the thoracic spine are noted. Review of the MIP images confirms the above findings. IMPRESSION: No evidence of pulmonary emboli. Findings of prior coronary bypass grafting with stenting. No acute abnormality noted. Electronically Signed   By: Inez Catalina M.D.   On: 10/19/2015 21:41  Mr Foot Right Wo Contrast  Result Date: 11/02/2015 CLINICAL DATA:  Soft tissue wound on the heel. EXAM: MRI OF THE RIGHT HIND IN FOOT WITHOUT CONTRAST TECHNIQUE: Multiplanar, multisequence MR imaging was performed. No intravenous contrast was administered. COMPARISON:  Radiographs  dated 10/31/2015 FINDINGS: There is a superficial soft tissue ulceration on the posterior aspect of the heel approximately 5 mm deep. There is slight edema in the adjacent subcutaneous fat. There is no abscess or osteomyelitis. No joint effusions. The achilles tendon at the tendons at the medial, lateral and anterior aspects of the ankle and hindfoot appear normal. IMPRESSION: Superficial soft tissue ulceration of the heel. No underlying abscess or osteomyelitis. Electronically Signed   By: Lorriane Shire M.D.   On: 11/02/2015 08:13   Mr Foot Left Wo Contrast  Result Date: 11/02/2015 CLINICAL DATA:  Diabetic patient with bilateral heel wounds. Septic shock. No known injury. Initial encounter. EXAM: MRI OF THE LEFT FOOT WITHOUT CONTRAST TECHNIQUE: Multiplanar, multisequence MR imaging was performed. No intravenous contrast was administered. COMPARISON:  Plain films left foot 10/31/2015. FINDINGS: A large skin ulceration seen over the dorsal margin of the calcaneus. Marrow edema is seen in the posterior aspect of the calcaneus most notable in the dorsal process and lateral calcaneus. No soft tissue abscess is identified. Mild edema is seen in the distal Achilles tendon eccentric with the medial side. Major ligaments and tendons about the ankle are intact. No joint effusion is identified. A bone small infarct is seen in the lateral talar without fragmentation or collapse. Mild degenerative change at the calcaneocuboid joint is noted. IMPRESSION: Large skin wound over the heel with marrow edema in the posterior calcaneus  subjacent to the wound consistent osteomyelitis. Negative for abscess or septic joint. Small bone infarct lateral talus. Electronically Signed   By: Inge Rise M.D.   On: 11/02/2015 08:16   Dg Chest Port 1 View  Result Date: 10/31/2015 CLINICAL DATA:  Hypotension EXAM: PORTABLE CHEST 1 VIEW COMPARISON:  10/19/2015 FINDINGS: Cardiac shadow is enlarged. Postsurgical changes are again seen.  The lungs are well aerated bilaterally. Mild interstitial changes are again seen without acute abnormality. IMPRESSION: No acute abnormality noted. Electronically Signed   By: Inez Catalina M.D.   On: 10/31/2015 18:01   Dg Foot Complete Left  Result Date: 10/31/2015 CLINICAL DATA:  Wound of bilateral heels for weeks. EXAM: LEFT FOOT - COMPLETE 3+ VIEW COMPARISON:  03/13/2011 MRI FINDINGS: Status post amputation of the great toe the level of the distal metatarsal. There is significant soft tissue swelling in the region of the metatarsophalangeal joints. There is soft tissue swelling superficial to the calcaneus. Additionally, foci of air are identified adjacent to the calcaneus consistent with ulceration. IMPRESSION: 1. Significant soft tissue swelling. 2. Soft tissue ulceration superficial to the calcaneus. 3. Status post great toe amputation. Electronically Signed   By: Nolon Nations M.D.   On: 10/31/2015 22:03   Dg Foot Complete Right  Result Date: 10/31/2015 CLINICAL DATA:  Wounds of bilateral heels for weeks. EXAM: RIGHT FOOT COMPLETE - 3+ VIEW COMPARISON:  None. FINDINGS: Surgical clips are identified in the region the ankle. No acute fracture or subluxation. No cortical erosion. Soft tissue swelling superficial to the calcaneus. IMPRESSION: Soft tissue swelling. Electronically Signed   By: Nolon Nations M.D.   On: 10/31/2015 22:01    Lab Data:  CBC:  Recent Labs Lab 11/02/15 0539 11/03/15 0251 11/04/15 0253 11/05/15 0748 11/06/15 1145 11/07/15 0952 11/08/15 0508  WBC 12.1* 11.4* 8.0 6.0 7.2 6.8 8.3  NEUTROABS 9.6* 8.8* 5.7 4.7  --   --   --   HGB 10.0* 9.3* 9.4* 10.5* 9.6* 10.4* 9.9*  HCT 33.1* 32.4* 34.0* 35.2* 34.4* 35.3* 34.4*  MCV 70.4* 72.8* 74.7* 73.0* 74.8* 73.7* 74.9*  PLT 204 205 158 116* 193 151 0000000   Basic Metabolic Panel:  Recent Labs Lab 11/02/15 0539 11/03/15 0251 11/04/15 0253 11/05/15 0748 11/06/15 1145 11/07/15 0952 11/08/15 0508  NA 138 136 138 138  140 138 139  K 4.1 3.9 3.7 4.3 3.4* 3.7 3.1*  CL 99* 100* 101 106 103 102 96*  CO2 26 30 26 25  33* 30 35*  GLUCOSE 121* 117* 97 120* 113* 170* 123*  BUN 19 12 7  <5* <5* 7 9  CREATININE 0.87 0.83 0.70 0.65 0.71 0.67 0.78  CALCIUM 8.5* 8.2* 8.3* 8.3* 8.4* 8.6* 8.6*  MG 2.2 2.1 1.9 1.9  --  2.0  --    GFR: Estimated Creatinine Clearance: 98 mL/min (by C-G formula based on SCr of 0.8 mg/dL). Liver Function Tests:  Recent Labs Lab 11/02/15 0539 11/03/15 0251 11/04/15 0253 11/05/15 0748 11/06/15 1145  AST 15 13* 13* 20 16  ALT 13* 13* 12* 15* 16*  ALKPHOS 52 48 45 48 47  BILITOT 0.8 0.6 0.7 0.6 0.6  PROT 6.1* 5.5* 5.2* 5.4* 5.8*  ALBUMIN 3.2* 3.0* 2.8* 2.9* 2.7*   No results for input(s): LIPASE, AMYLASE in the last 168 hours. No results for input(s): AMMONIA in the last 168 hours. Coagulation Profile: No results for input(s): INR, PROTIME in the last 168 hours. Cardiac Enzymes: No results for input(s): CKTOTAL, CKMB, CKMBINDEX, TROPONINI  in the last 168 hours. BNP (last 3 results) No results for input(s): PROBNP in the last 8760 hours. HbA1C: No results for input(s): HGBA1C in the last 72 hours. CBG:  Recent Labs Lab 11/07/15 1636 11/07/15 2134 11/08/15 0520 11/08/15 0911 11/08/15 1140  GLUCAP 109* 108* 117* 151* 138*   Lipid Profile: No results for input(s): CHOL, HDL, LDLCALC, TRIG, CHOLHDL, LDLDIRECT in the last 72 hours. Thyroid Function Tests: No results for input(s): TSH, T4TOTAL, FREET4, T3FREE, THYROIDAB in the last 72 hours. Anemia Panel: No results for input(s): VITAMINB12, FOLATE, FERRITIN, TIBC, IRON, RETICCTPCT in the last 72 hours. Urine analysis:    Component Value Date/Time   COLORURINE YELLOW 10/31/2015 1608   APPEARANCEUR CLOUDY (A) 10/31/2015 1608   LABSPEC 1.012 10/31/2015 1608   PHURINE 8.5 (H) 10/31/2015 1608   GLUCOSEU NEGATIVE 10/31/2015 1608   HGBUR NEGATIVE 10/31/2015 1608   HGBUR negative 03/06/2010 1419   BILIRUBINUR NEGATIVE  10/31/2015 1608   KETONESUR NEGATIVE 10/31/2015 1608   PROTEINUR NEGATIVE 10/31/2015 1608   UROBILINOGEN 0.2 09/13/2014 0945   NITRITE NEGATIVE 10/31/2015 1608   LEUKOCYTESUR SMALL (A) 10/31/2015 1608     RAI,RIPUDEEP M.D. [facility] 11/08/2015, 1:59 PM  Pager: [phone] Between 7am to 7pm - call Pager - [phone]  After 7pm go to [email or web address] - password [email or web address]  Call night coverage person covering after 7pm

## 2015-11-08 NOTE — Anesthesia Postprocedure Evaluation (Signed)
Anesthesia Post Note  Patient: [name]  Procedure(s) Performed: Procedure(s) (LRB): AMPUTATION ABOVE KNEE LEFT (Left)  Patient location during evaluation: PACU Anesthesia Type: General Level of consciousness: awake Pain management: pain level controlled Vital Signs Assessment: post-procedure vital signs reviewed and stable Respiratory status: spontaneous breathing Cardiovascular status: stable Postop Assessment: no signs of nausea or vomiting Anesthetic complications: no    Last Vitals:  Vitals:   11/08/15 0952 11/08/15 1005  BP: (!) 99/53 (!) 99/56  Pulse: (!) 104 (!) 104  Resp: 20 19  Temp:  36.9 C    Last Pain:  Vitals:   11/08/15 1005  TempSrc:   PainSc: 0-No pain                 Kadynce Bonds

## 2015-11-08 NOTE — Progress Notes (Signed)
ANTICOAGULATION CONSULT NOTE - Follow Up Consult  Pharmacy Consult for Heparin (Xarelto on hold) Indication: atrial fibrillation  Allergies  Allergen Reactions  . Diazepam Anxiety    REACTION: makes patient cry  . Vancomycin Other (See Comments)    "red man syndrome"    Patient Measurements: Height: 5\' 8"  (172.7 cm) Weight: 260 lb 2.3 oz (118 kg) IBW/kg (Calculated) : 68.4  Vital Signs: Temp: 98.5 F (36.9 C) (08/16 1900) Temp Source: Oral (08/16 1900) BP: 110/55 (08/16 1900) Pulse Rate: 100 (08/16 1900)  Labs:  Recent Labs  11/05/15 0748 11/06/15 1145  11/07/15 0952 11/07/15 1730 11/08/15 0508  HGB 10.5* 9.6*  --  10.4*  --   --   HCT 35.2* 34.4*  --  35.3*  --   --   PLT 116* 193  --  151  --   --   HEPARINUNFRC  --  <0.10*  < > 0.17* <0.10* 0.27*  CREATININE 0.65 0.71  --  0.67  --  0.78  < > = values in this interval not displayed.  Estimated Creatinine Clearance: 98 mL/min (by C-G formula based on SCr of 0.8 mg/dL).   Assessment: Heparin while Xarelto on hold for procedures, HL is sub-therapeutic despite rate increases. No issues per RN. OR this AM.  Goal of Therapy:  Heparin level 0.3-0.7 units/ml Monitor platelets by anticoagulation protocol: Yes   Plan:  -Increase heparin to 2700 units/hr -F/U post-op  Narda Bonds 11/08/2015,6:28 AM

## 2015-11-08 NOTE — Progress Notes (Signed)
[place] for heparin Indication: atrial fibrillation  Allergies  Allergen Reactions  . Diazepam Anxiety    REACTION: makes patient cry  . Vancomycin Other (See Comments)    "red man syndrome"    Patient Measurements: Height: 5\' 8"  (172.7 cm) Weight: 260 lb 2.3 oz (118 kg) IBW/kg (Calculated) : 68.4 Heparin Dosing Weight: 95kg  Vital Signs: Temp: 98.8 F (37.1 C) (08/17 1358) Temp Source: Oral (08/17 1358) BP: 112/44 (08/17 1358) Pulse Rate: 102 (08/17 1358)  Labs:  Recent Labs  11/06/15 1145  11/07/15 0952 11/07/15 1730 11/08/15 0508 11/08/15 2102  HGB 9.6*  --  10.4*  --  9.9*  --   HCT 34.4*  --  35.3*  --  34.4*  --   PLT 193  --  151  --  158  --   HEPARINUNFRC <0.10*  < > 0.17* <0.10* 0.27* 0.59  CREATININE 0.71  --  0.67  --  0.78  --   < > = values in this interval not displayed.   Medications:  Infusions:  . sodium chloride 10 mL/hr at 11/05/15 1523  . sodium chloride 50 mL/hr at 11/08/15 1115  . heparin 2,700 Units/hr (11/08/15 1116)    Assessment: 48 yom presented to the ED with syncope. He is on chronic xarelto for history of afib but switched to heparin on admission for procedures. 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 Pharmacy consulted to resume heparin 2 hours post-AKA at 1100. Hg low stable, no bleed issues per RN.  PM HL back within range  Goal of Therapy:  Heparin level 0.3-0.7 units/ml Monitor platelets by anticoagulation protocol: Yes   Plan:  -Continue heparin at current rate -daily HL/CBC -Monitor s/sx bleeding -F/u ability to restart Xarelto after AKA/surgeries completed  Thank you Anette Guarneri, PharmD [phone]  11/08/2015 9:30 PM

## 2015-11-08 NOTE — Transfer of Care (Signed)
Immediate Anesthesia Transfer of Care Note  Patient: [name]  Procedure(s) Performed: Procedure(s): AMPUTATION ABOVE KNEE LEFT (Left)  Patient Location: PACU  Anesthesia Type:General  Level of Consciousness: awake, alert  and oriented  Airway & Oxygen Therapy: Patient Spontanous Breathing and Patient connected to nasal cannula oxygen  Post-op Assessment: Report given to RN, Post -op Vital signs reviewed and stable and Patient moving all extremities X 4  Post vital signs: Reviewed and stable  Last Vitals:  Vitals:   11/08/15 0545 11/08/15 0907  BP: (!) 114/56 115/61  Pulse: 95 (!) 115  Resp: 20 11  Temp: 36.8 C 37.2 C    Last Pain:  Vitals:   11/08/15 0545  TempSrc: Oral  PainSc:       Patients Stated Pain Goal: 2 ([id number])  Complications: No apparent anesthesia complications

## 2015-11-08 NOTE — Progress Notes (Addendum)
[place] for heparin Indication: atrial fibrillation  Allergies  Allergen Reactions  . Diazepam Anxiety    REACTION: makes patient cry  . Vancomycin Other (See Comments)    "red man syndrome"    Patient Measurements: Height: 5\' 8"  (172.7 cm) Weight: 260 lb 2.3 oz (118 kg) IBW/kg (Calculated) : 68.4 Heparin Dosing Weight: 95kg  Vital Signs: Temp: 98.4 F (36.9 C) (08/17 1005) Temp Source: Oral (08/17 0545) BP: 99/56 (08/17 1005) Pulse Rate: 104 (08/17 1005)  Labs:  Recent Labs  11/06/15 1145  11/07/15 0952 11/07/15 1730 11/08/15 0508  HGB 9.6*  --  10.4*  --  9.9*  HCT 34.4*  --  35.3*  --  34.4*  PLT 193  --  151  --  158  HEPARINUNFRC <0.10*  < > 0.17* <0.10* 0.27*  CREATININE 0.71  --  0.67  --  0.78  < > = values in this interval not displayed.   Medications:  Infusions:  . sodium chloride 10 mL/hr at 11/05/15 1523  . sodium chloride    . heparin      Assessment: 1 yom presented to the ED with syncope. He is on chronic xarelto for history of afib but switched to heparin on admission for procedures. 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 Pharmacy consulted to resume heparin 2 hours post-AKA at 1100. Hg low stable, no bleed issues per RN.  Update: RN notes patient lost IV access for several hours post-op. Heparin resumed ~1330. Rx will move heparin level appropriately.  Goal of Therapy:  Heparin level 0.3-0.7 units/ml Monitor platelets by anticoagulation protocol: Yes   Plan:  -Resume heparin 2h post-AKA at 1100 at previous rate 2700 units/h -8h HL, daily HL/CBC -Monitor s/sx bleeding -F/u ability to restart Xarelto after AKA/surgeries completed  Elicia Lamp, PharmD, BCPS Clinical Pharmacist 11/08/2015 11:01 AM

## 2015-11-08 NOTE — Progress Notes (Signed)
  Progress Note    11/08/2015 7:18 AM Day of Surgery  Subjective:  Ready for surgery  Vitals:   11/07/15 1900 11/08/15 0545  BP: (!) 110/55 (!) 114/56  Pulse: 100 95  Resp: 20 20  Temp: 98.5 F (36.9 C) 98.2 F (36.8 C)    Physical Exam: Cardiac:  irregular Lungs:  Non labored lying flat this a.m. Abd: minimal ttp Ext: bilateral feet dressings cdi  CBC    Component Value Date/Time   WBC 8.3 11/08/2015 0508   RBC 4.59 11/08/2015 0508   HGB 9.9 (L) 11/08/2015 0508   HCT 34.4 (L) 11/08/2015 0508   PLT 158 11/08/2015 0508   MCV 74.9 (L) 11/08/2015 0508   MCH 21.6 (L) 11/08/2015 0508   MCHC 28.8 (L) 11/08/2015 0508   RDW 24.0 (H) 11/08/2015 0508   LYMPHSABS 0.8 11/05/2015 0748   MONOABS 0.4 11/05/2015 0748   EOSABS 0.1 11/05/2015 0748   BASOSABS 0.0 11/05/2015 0748    BMET    Component Value Date/Time   NA 139 11/08/2015 0508   NA 136 (A) 07/26/2015   K 3.1 (L) 11/08/2015 0508   CL 96 (L) 11/08/2015 0508   CO2 35 (H) 11/08/2015 0508   GLUCOSE 123 (H) 11/08/2015 0508   BUN 9 11/08/2015 0508   BUN 37 (A) 07/26/2015   CREATININE 0.78 11/08/2015 0508   CREATININE 1.11 06/14/2013 1221   CALCIUM 8.6 (L) 11/08/2015 0508   GFRNONAA >60 11/08/2015 0508   GFRAA >60 11/08/2015 0508    INR    Component Value Date/Time   INR 1.39 10/19/2015 1833     Intake/Output Summary (Last 24 hours) at 11/08/15 0718 Last data filed at 11/07/15 2030  Gross per 24 hour  Intake          2027.08 ml  Output             3300 ml  Net         -1272.92 ml     Assessment:  75 y.o. male with bilateral heel ulcers. Left heel ulcer is to the bone and was source of sepsis on admission. Angiogram with revascularization options.   Plan: -left above knee amputation today -we have discussed the risks and benefits of this operation together with his wife and daughter. He understands that the risk of all surgery is death and that he could have significant blood loss from a major surgery  possibly requiring transfusion. He is also aware of the risk of non-healing and the unlikelihood of him walking in the future without considerable weight loss and increased strength. He is mostly despondent about having an amputation but is agreeable to proceed.   Brandon C. Donzetta Matters, MD [facility] Office: [phone] Pager: [phone]  11/08/2015 7:18 AM

## 2015-11-09 ENCOUNTER — Telehealth: Payer: Self-pay | Admitting: Vascular Surgery

## 2015-11-09 ENCOUNTER — Encounter (HOSPITAL_COMMUNITY): Payer: Self-pay | Admitting: Vascular Surgery

## 2015-11-09 LAB — GLUCOSE, CAPILLARY
GLUCOSE-CAPILLARY: 177 mg/dL — AB (ref 65–99)
GLUCOSE-CAPILLARY: 179 mg/dL — AB (ref 65–99)
GLUCOSE-CAPILLARY: 156 mg/dL — AB (ref 65–99)
Glucose-Capillary: 166 mg/dL — ABNORMAL HIGH (ref 65–99)

## 2015-11-09 LAB — CBC
HCT: 32.6 % — ABNORMAL LOW (ref 39.0–52.0)
Hemoglobin: 9.1 g/dL — ABNORMAL LOW (ref 13.0–17.0)
MCH: 21 pg — ABNORMAL LOW (ref 26.0–34.0)
MCHC: 27.9 g/dL — ABNORMAL LOW (ref 30.0–36.0)
MCV: 75.1 fL — ABNORMAL LOW (ref 78.0–100.0)
Platelets: 171 10*3/uL (ref 150–400)
RBC: 4.34 MIL/uL (ref 4.22–5.81)
RDW: 24 % — ABNORMAL HIGH (ref 11.5–15.5)
WBC: 11.7 10*3/uL — ABNORMAL HIGH (ref 4.0–10.5)

## 2015-11-09 LAB — HEPARIN LEVEL (UNFRACTIONATED): Heparin Unfractionated: 0.52 IU/mL (ref 0.30–0.70)

## 2015-11-09 LAB — BASIC METABOLIC PANEL
ANION GAP: 10 (ref 5–15)
BUN: 8 mg/dL (ref 6–20)
CO2: 35 mmol/L — ABNORMAL HIGH (ref 22–32)
Calcium: 8.4 mg/dL — ABNORMAL LOW (ref 8.9–10.3)
Chloride: 95 mmol/L — ABNORMAL LOW (ref 101–111)
Creatinine, Ser: 0.83 mg/dL (ref 0.61–1.24)
GLUCOSE: 169 mg/dL — AB (ref 65–99)
POTASSIUM: 3.4 mmol/L — AB (ref 3.5–5.1)
SODIUM: 140 mmol/L (ref 135–145)

## 2015-11-09 MED ORDER — RIVAROXABAN 20 MG PO TABS
20.0000 mg | ORAL_TABLET | Freq: Every day | ORAL | Status: DC
  Administered 2015-11-09: 20 mg via ORAL
  Filled 2015-11-09: qty 1

## 2015-11-09 MED ORDER — POTASSIUM CHLORIDE CRYS ER 20 MEQ PO TBCR
40.0000 meq | EXTENDED_RELEASE_TABLET | Freq: Once | ORAL | Status: AC
  Administered 2015-11-09: 40 meq via ORAL
  Filled 2015-11-09: qty 2

## 2015-11-09 MED ORDER — SENNOSIDES-DOCUSATE SODIUM 8.6-50 MG PO TABS
1.0000 | ORAL_TABLET | Freq: Two times a day (BID) | ORAL | Status: DC
  Administered 2015-11-09 – 2015-11-10 (×3): 1 via ORAL
  Filled 2015-11-09 (×3): qty 1

## 2015-11-09 MED ORDER — BISACODYL 10 MG RE SUPP: 10.0000 mg | Freq: Every day | RECTAL | Status: DC | PRN

## 2015-11-09 MED ORDER — POLYETHYLENE GLYCOL 3350 17 G PO PACK
17.0000 g | PACK | Freq: Every day | ORAL | Status: DC
  Administered 2015-11-09 – 2015-11-10 (×2): 17 g via ORAL
  Filled 2015-11-09 (×2): qty 1

## 2015-11-09 NOTE — Progress Notes (Signed)
  Progress Note    11/09/2015 6:47 AM 1 Day Post-Op  Subjective:  Some pain at LLE amputation site  Vitals:   11/08/15 2200 11/09/15 0425  BP: (!) 117/51 (!) 106/53  Pulse: (!) 109 (!) 109  Resp: 18 16  Temp: 98.7 F (37.1 C) 99.3 F (37.4 C)    Physical Exam: Left ak amp site dressing cdi R foot dressing in place  CBC    Component Value Date/Time   WBC 11.7 (H) 11/09/2015 0445   RBC 4.34 11/09/2015 0445   HGB 9.1 (L) 11/09/2015 0445   HCT 32.6 (L) 11/09/2015 0445   PLT 171 11/09/2015 0445   MCV 75.1 (L) 11/09/2015 0445   MCH 21.0 (L) 11/09/2015 0445   MCHC 27.9 (L) 11/09/2015 0445   RDW 24.0 (H) 11/09/2015 0445   LYMPHSABS 0.8 11/05/2015 0748   MONOABS 0.4 11/05/2015 0748   EOSABS 0.1 11/05/2015 0748   BASOSABS 0.0 11/05/2015 0748    BMET    Component Value Date/Time   NA 140 11/09/2015 0445   NA 136 (A) 07/26/2015   K 3.4 (L) 11/09/2015 0445   CL 95 (L) 11/09/2015 0445   CO2 35 (H) 11/09/2015 0445   GLUCOSE 169 (H) 11/09/2015 0445   BUN 8 11/09/2015 0445   BUN 37 (A) 07/26/2015   CREATININE 0.83 11/09/2015 0445   CREATININE 1.11 06/14/2013 1221   CALCIUM 8.4 (L) 11/09/2015 0445   GFRNONAA >60 11/09/2015 0445   GFRAA >60 11/09/2015 0445    INR    Component Value Date/Time   INR 1.39 10/19/2015 1833     Intake/Output Summary (Last 24 hours) at 11/09/15 0647 Last data filed at 11/09/15 0300  Gross per 24 hour  Intake             1200 ml  Output             1950 ml  Net             -750 ml     Assessment:  75 y.o. male is pod#1 left AKA for heel ulceration  Plan: -inpatient rehab consult -local wound care to R lateral heel ulcer -no need for antibiotics from vascular standpoint   Andrews Tener C. Donzetta Matters, MD [facility] Office: [phone] Pager: [phone]  11/09/2015 6:47 AM

## 2015-11-09 NOTE — Progress Notes (Signed)
[place] for heparin >> restart Xarelto Indication: atrial fibrillation  Allergies  Allergen Reactions  . Diazepam Anxiety    REACTION: makes patient cry  . Vancomycin Other (See Comments)    "red man syndrome"    Patient Measurements: Height: 5\' 8"  (172.7 cm) Weight: 251 lb 5.2 oz (114 kg) IBW/kg (Calculated) : 68.4 Heparin Dosing Weight: 95kg  Vital Signs: Temp: 99.3 F (37.4 C) (08/18 0425) Temp Source: Oral (08/18 0425) BP: 105/62 (08/18 0828) Pulse Rate: 99 (08/18 0828)  Labs:  Recent Labs  11/07/15 0952  11/08/15 0508 11/08/15 2102 11/09/15 0445  HGB 10.4*  --  9.9*  --  9.1*  HCT 35.3*  --  34.4*  --  32.6*  PLT 151  --  158  --  171  HEPARINUNFRC 0.17*  < > 0.27* 0.59 0.52  CREATININE 0.67  --  0.78  --  0.83  < > = values in this interval not displayed.   Medications:  Infusions:  . sodium chloride 10 mL/hr at 11/05/15 1523  . sodium chloride 50 mL/hr at 11/08/15 1115  . heparin 2,700 Units/hr (11/09/15 1232)    Assessment: 50 yom presented to the ED with syncope. He is on chronic xarelto for history of afib but switched to heparin on admission for procedures. 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   Orders to restart Xarelto today.  Goal of Therapy:  Heparin level 0.3-0.7 units/ml Monitor platelets by anticoagulation protocol: Yes   Plan:  Discontinue heparin and associated labs. Xarelto 20mg  PO daily - first dose now.  [employer], Pharm.D., BCPS Clinical Pharmacist Pager [phone] 11/09/2015 1:21 PM

## 2015-11-09 NOTE — Progress Notes (Signed)
Heparin drip paused because IV in right thumb came out, pharmacy notified and IV team consult was ordered.

## 2015-11-09 NOTE — Telephone Encounter (Signed)
Sched appt 9/15 at 11:00. Lm on cell# and spoke to [place] at [employer] to inform pt of appt.

## 2015-11-09 NOTE — Evaluation (Signed)
Physical Therapy Evaluation Patient Details Name: [name] MRN: [id number] DOB: Oct 14, 1940 Today's Date: 11/09/2015   History of Present Illness  Pt adm from SNF with septic shock due to osteomyelitis LLE and underwent L-AKA 8/17. PMH - chf, htn, pvd, dm, copd, cad, cabg, PE, cva  Clinical Impression  Pt admitted with above diagnosis and presents to PT with functional limitations due to deficits listed below (See PT problem list). Pt needs skilled PT to maximize independence and safety to allow discharge to back to SNF for further rehab. Expect progress will be slow due to multiple comorbidities.     Follow Up Recommendations SNF    Equipment Recommendations  None recommended by PT    Recommendations for Other Services       Precautions / Restrictions Precautions Precautions: Fall Required Braces or Orthoses: Other Brace/Splint Other Brace/Splint: PRAFO with walking sole Restrictions Weight Bearing Restrictions: No      Mobility  Bed Mobility Overal bed mobility: Needs Assistance Bed Mobility: Supine to Sit;Sit to Supine Rolling: +2 for physical assistance;Max assist   Supine to sit: +2 for safety/equipment;Total assist Sit to supine: +2 for physical assistance;Max assist   General bed mobility comments: Assist for all aspects  Transfers                    Ambulation/Gait                Stairs            Wheelchair Mobility    Modified Rankin (Stroke Patients Only)       Balance Overall balance assessment: Needs assistance Sitting-balance support: Bilateral upper extremity supported;Feet unsupported Sitting balance-[name]: Poor Sitting balance - Comments: min to mod A to maintain sitting EOB. Pt became light headed after sitting 2-3 minutes and had to be returned to supine.                                     Pertinent Vitals/Pain Pain Assessment: Faces Faces Pain Scale: Hurts even more Pain Location: lt  residual limb Pain Descriptors / Indicators: Grimacing;Guarding Pain Intervention(s): Limited activity within patient's tolerance;Monitored during session;Repositioned    Home Living Family/patient expects to be discharged to:: Skilled nursing facility                      Prior Function Level of Independence: Needs assistance   Gait / Transfers Assistance Needed: During previous adm a few weeks ago pt requiring +2 assist to stand and pivot to chair.  ADL's / Homemaking Assistance Needed: Dependent with ADLs.        Hand Dominance   Dominant Hand: Right    Extremity/Trunk Assessment   Upper Extremity Assessment: Generalized weakness;RUE deficits/detail RUE Deficits / Details: Long standing rt shoulder limitations         Lower Extremity Assessment: LLE deficits/detail;RLE deficits/detail RLE Deficits / Details: <3/5 strength LLE Deficits / Details: AKA     Communication   Communication: HOH  Cognition Arousal/Alertness: Awake/alert Behavior During Therapy: WFL for tasks assessed/performed Overall Cognitive Status: Within Functional Limits for tasks assessed                      General Comments      Exercises        Assessment/Plan    PT Assessment Patient needs continued PT services  PT  Diagnosis Difficulty walking;Generalized weakness;Acute pain   PT Problem List Decreased strength;Decreased activity tolerance;Decreased balance;Decreased mobility;Obesity;Pain  PT Treatment Interventions DME instruction;Functional mobility training;Therapeutic activities;Therapeutic exercise;Balance training;Patient/family education   PT Goals (Current goals can be found in the Care Plan section) Acute Rehab PT Goals Patient Stated Goal: not stated PT Goal Formulation: With patient/family Time For Goal Achievement: 11/23/15 Potential to Achieve Goals: Fair    Frequency Min 2X/week   Barriers to discharge        Co-evaluation                End of Session Equipment Utilized During Treatment: Oxygen Activity Tolerance: Patient limited by fatigue Patient left: in bed;with call bell/phone within reach;with family/visitor present Nurse Communication: Mobility status         Time: [id number] PT Time Calculation (min) (ACUTE ONLY): 13 min   Charges:   PT Evaluation $PT Eval Moderate Complexity: 1 Procedure     PT G Codes:        [name] 2015/12/07, 4:28 PM [facility] PT [phone]

## 2015-11-09 NOTE — Progress Notes (Signed)
Thank you for consult on Mr. [name]. Chart reviewed and note that he was admitted from [facility] with septic shock due to osteomyelitis LLE and underwent L-AKA 8/17. Recommend continued therapy at facility after discharge. Will defer CIR consult.

## 2015-11-09 NOTE — Telephone Encounter (Signed)
-----   Message from Mena Goes, RN sent at 11/09/2015 10:37 AM EDT ----- Regarding: AKA postop 4 weeks   ----- Message ----- From: Alvia Grove, PA-C Sent: 11/09/2015   9:26 AM To: Vvs Charge Pool  S/p left AKA 11/08/15  F/u with Dr. Donzetta Matters in 4 weeks  Thanks Maudie Mercury

## 2015-11-09 NOTE — Progress Notes (Signed)
[facility]                                                                              Patient Demographics  [name], is a 75 y.o. male, DOB - [birth year], [id number]  Admit date - 10/31/2015   Admitting Physician Etta Quill, DO  Outpatient Primary MD for the patient is WALDEN,JEFF, MD  Outpatient specialists:   LOS - 9  days    Chief Complaint  Patient presents with  . 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 During that time he was treated with aggressive diuresis and his diuretics were adjusted on discharge. On the day of his readmission he started feeling weak w/ a headache and neck pain. He then passed out while sitting in a chair in his nursing home so he was sent to [facility].   In the ED he was noted to have leukocytosis, hypotension, and acute kidney injury, as well as chronic wounds on his legs and sacral decubitus ulcers. The patient was hypotensive in the ED and required volume resuscitation.   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  Chronic combined systolic and diastolic CHF -EF [id number] via TTE 10/22/15 w/ grade 2 DD  -  no ACEi/ARB for now due to recent acute renal failure , restart low dose coreg - Positive balance of 5L, continue Lasix 80 mg twice a day  HTN -BP soft  Chronic atrial fibrillation -Restart xarelto, will DC heparin drip once placed on xarelto  - Rate controlled  Acute renal  failure -most likely secondary to septic shock - resolved   COPD -Titrate O2 to maintain SPO2 89-93% - well compensated at this time  DM2 -2/17 A1c 7.4 - CBG currently reasonably controlled   Anxiety/Depression -well controlled at this time   Microcytic Anemia -no evidence of acute blood loss  - Anemia panel consistent with anemia of chronic disease does have component of iron deficiency as well, avoid IV Fe in setting of infection  Morbid obesity Diet and weight control consult  Hypokalemia Replaced  Code Status: DO NOT RESUSCITATE DVT Prophylaxis:  IV heparin Family Communication: Discussed in detail with the patient, all imaging results, lab results explained to the patient   Disposition Plan: Likely skilled nursing facility in a.m.  Time Spent in minutes   25 minutes  Procedures:  Angiogram on 8/15 Left AKA on 8/17  Consultants:   Vascular surgery  Antimicrobials:  Zosyn 8/9 > Vancomycin 8/9 >   Medications  Scheduled Meds: . antiseptic oral rinse  7 mL Mouth Rinse BID  . antiseptic oral rinse  7  mL Mouth Rinse BID  . aspirin EC  81 mg Oral Daily  . atorvastatin  40 mg Oral q1800  . carvedilol  3.125 mg Oral BID WC  . Chlorhexidine Gluconate Cloth  6 each Topical Q0600  . collagenase   Topical Daily  . diphenhydrAMINE  25 mg Intravenous Q12H  . DULoxetine  90 mg Oral Daily  . feeding supplement (GLUCERNA SHAKE)  237 mL Oral Q24H  . feeding supplement (PRO-STAT SUGAR FREE 64)  30 mL Oral BID  . ferrous sulfate  325 mg Oral Q breakfast  . furosemide  80 mg Oral BID  . gabapentin  400 mg Oral TID  . insulin aspart  0-15 Units Subcutaneous TID WC  . insulin aspart  8 Units Subcutaneous TID WC  . insulin NPH Human  18 Units Subcutaneous BID AC & HS  . lidocaine  1 patch Transdermal Q24H  . lubiprostone  8 mcg Oral BID WC  . multivitamin with minerals  1 tablet Oral Daily  . mupirocin ointment  1 application Nasal BID  . pantoprazole  80 mg Oral  Daily  . piperacillin-tazobactam (ZOSYN)  IV  3.375 g Intravenous Q8H  . polyethylene glycol  17 g Oral Daily  . polyvinyl alcohol  1 drop Both Eyes BID  . senna-docusate  1 tablet Oral BID  . sodium chloride flush  10-40 mL Intracatheter Q12H  . sodium chloride flush  3 mL Intravenous Q12H  . tamsulosin  0.4 mg Oral QPC supper  . traZODone  100 mg Oral QHS  . vancomycin  1,250 mg Intravenous Q12H   Continuous Infusions: . sodium chloride 10 mL/hr at 11/05/15 1523  . sodium chloride 50 mL/hr at 11/08/15 1115  . heparin Stopped (11/09/15 1136)   PRN Meds:.acetaminophen **OR** acetaminophen, alum & mag hydroxide-simeth, bisacodyl, fentaNYL (SUBLIMAZE) injection, guaiFENesin-dextromethorphan, hydrALAZINE, labetalol, LORazepam, metoprolol, ondansetron, oxyCODONE-acetaminophen, phenol, sodium chloride flush   Antibiotics   Anti-infectives    Start     Dose/Rate Route Frequency Ordered Stop   11/02/15 1315  vancomycin (VANCOCIN) 1,250 mg in sodium chloride 0.9 % 250 mL IVPB     1,250 mg 125 mL/hr over 120 Minutes Intravenous Every 12 hours 11/02/15 1305     11/01/15 0100  piperacillin-tazobactam (ZOSYN) IVPB 3.375 g     3.375 g 12.5 mL/hr over 240 Minutes Intravenous Every 8 hours 10/31/15 2133     10/31/15 2200  vancomycin (VANCOCIN) 1,250 mg in sodium chloride 0.9 % 250 mL IVPB  Status:  Discontinued     1,250 mg 125 mL/hr over 120 Minutes Intravenous Every 24 hours 10/31/15 2133 11/02/15 1305   10/31/15 1715  piperacillin-tazobactam (ZOSYN) IVPB 3.375 g     3.375 g 100 mL/hr over 30 Minutes Intravenous  Once 10/31/15 1702 10/31/15 1907   10/31/15 1715  linezolid (ZYVOX) IVPB 600 mg     600 mg 300 mL/hr over 60 Minutes Intravenous  Once 10/31/15 1707 10/31/15 1938        Subjective:   [name] was seen and examined today. Postop day #1, still feeling pain in his left leg. Otherwise no fevers.  Patient denies dizziness, chest pain, shortness of breath, abdominal pain,  N/V/D/C, new weakness, numbess, tingling. Did not sleep well due to pain  Objective:   Vitals:   11/08/15 1358 11/08/15 2200 11/09/15 0425 11/09/15 0828  BP: (!) 112/44 (!) 117/51 (!) 106/53 105/62  Pulse: (!) 102 (!) 109 (!) 109 99  Resp: 18 18 16  Temp: 98.8 F (37.1 C) 98.7 F (37.1 C) 99.3 F (37.4 C)   TempSrc: Oral Oral Oral   SpO2: 91% 93% 91%   Weight:   114 kg (251 lb 5.2 oz)   Height:        Intake/Output Summary (Last 24 hours) at 11/09/15 1209 Last data filed at 11/09/15 [id number]  Gross per 24 hour  Intake              720 ml  Output             1875 ml  Net            -1155 ml     Wt Readings from Last 3 Encounters:  11/09/15 114 kg (251 lb 5.2 oz)  11/05/15 127 kg (280 lb)  10/29/15 117.5 kg (259 lb)     Exam  General: Alert and oriented x 3, NAD  HEENT:    Neck: Supple, no JVD, no masses  Cardiovascular: S1 S2clear, RRR  Respiratory: Clear to auscultation bilaterally, no wheezing, rales or rhonchi  Gastrointestinal: Soft, nontender, nondistended, + bowel sounds  Ext: Left AKA , Dressing intact  Neuro:   Skin: No rashes  Psych: Normal affect and demeanor, alert and oriented x3    Data Reviewed:  I have personally reviewed following labs and imaging studies  Micro Results Recent Results (from the past 240 hour(s))  Blood Culture (routine x 2)     Status: None   Collection Time: 10/31/15  6:15 PM  Result Value Ref Range Status   Specimen Description BLOOD LEFT HAND  Final   Special Requests BOTTLES DRAWN AEROBIC AND ANAEROBIC 5ML   Final   Culture NO GROWTH 5 DAYS  Final   Report Status 11/05/2015 FINAL  Final  Blood Culture (routine x 2)     Status: None   Collection Time: 10/31/15  6:30 PM  Result Value Ref Range Status   Specimen Description BLOOD RIGHT HAND  Final   Special Requests BOTTLES DRAWN AEROBIC ONLY 5ML  Final   Culture NO GROWTH 5 DAYS  Final   Report Status 11/05/2015 FINAL  Final  Surgical pcr screen     Status:  Abnormal   Collection Time: 11/06/15  4:59 AM  Result Value Ref Range Status   MRSA, PCR POSITIVE (A) NEGATIVE Final    Comment: RESULT CALLED TO, READ BACK BY AND VERIFIED WITH: S. ROGERS,RN AT 0820 ON [id number] BY S. YARBROUGH    Staphylococcus aureus POSITIVE (A) NEGATIVE Final    Comment:        The Xpert SA Assay (FDA approved for NASAL specimens in patients over 14 years of age), is one component of a comprehensive surveillance program.  Test performance has been validated by [facility] for patients greater than or equal to 13 year old. It is not intended to diagnose infection nor to guide or monitor treatment.     Radiology Reports Dg Chest 2 View  Result Date: 10/19/2015 CLINICAL DATA:  Productive cough. EXAM: CHEST  2 VIEW COMPARISON:  08/24/2014 FINDINGS: Postsurgical changes from CABG is stable. Cardiomediastinal silhouette is mildly enlarged. Mediastinal contours appear intact. There is no evidence of focal airspace consolidation, pleural effusion or pneumothorax. Mild pulmonary vascular congestion. Osseous structures are without acute abnormality. Soft tissues are grossly normal. IMPRESSION: Mildly enlarged cardiac silhouette. Mild pulmonary vascular congestion. Electronically Signed   By: Fidela Salisbury M.D.   On: 10/19/2015 17:29  Ct Head Wo Contrast  Result Date:  10/31/2015 CLINICAL DATA:  On blood thinner.  Headache. EXAM: CT HEAD WITHOUT CONTRAST TECHNIQUE: Contiguous axial images were obtained from the base of the skull through the vertex without intravenous contrast. COMPARISON:  08/06/2014 FINDINGS: Brain: No evidence of acute infarction, hemorrhage, hydrocephalus, or mass lesion/mass effect. Extensive remote left MCA territory infarction, upper division, extending into the posterior border zone. Remote small vessel infarcts in the bilateral cerebellum. Vascular: Atherosclerotic calcifications Skull: Negative for fracture or focal lesion. Sinuses/Orbits: Bilateral  cataract resection. Other: None. IMPRESSION: 1. No acute finding or change compared to prior. 2. Remote left MCA territory infarction. Remote small bilateral cerebellar infarcts. Electronically Signed   By: Monte Fantasia M.D.   On: 10/31/2015 17:11   Ct Angio Chest Pe W And/or Wo Contrast  Result Date: 10/19/2015 CLINICAL DATA:  Shortness of Breath with productive cough EXAM: CT ANGIOGRAPHY CHEST WITH CONTRAST TECHNIQUE: Multidetector CT imaging of the chest was performed using the standard protocol during bolus administration of intravenous contrast. Multiplanar CT image reconstructions and MIPs were obtained to evaluate the vascular anatomy. CONTRAST:  80 mL Isovue 370. COMPARISON:  None. FINDINGS: Mediastinum/Lymph Nodes: The thoracic inlet is within normal limits. No significant hilar or mediastinal adenopathy is noted. Cardiovascular: Pulmonary artery demonstrates a normal branching pattern. No findings to suggest pulmonary emboli are seen. Aortic calcifications are noted without aneurysmal dilatation or dissection. Changes consistent with coronary bypass grafting are noted. Multiple coronary stents are noted within the bypass grafts. Native coronary calcifications are seen. No right heart strain is noted. No pericardial effusion is seen. Lungs/Pleura: Bilateral pleural effusions right greater than left are seen. Bilateral dependent atelectatic changes are noted. No significant pulmonary edema is noted. Upper abdomen: Visualized upper abdomen is within normal limits. Musculoskeletal: Degenerative changes of the thoracic spine are noted. Review of the MIP images confirms the above findings. IMPRESSION: No evidence of pulmonary emboli. Findings of prior coronary bypass grafting with stenting. No acute abnormality noted. Electronically Signed   By: Inez Catalina M.D.   On: 10/19/2015 21:41  Mr Foot Right Wo Contrast  Result Date: 11/02/2015 CLINICAL DATA:  Soft tissue wound on the heel. EXAM: MRI OF THE  RIGHT HIND IN FOOT WITHOUT CONTRAST TECHNIQUE: Multiplanar, multisequence MR imaging was performed. No intravenous contrast was administered. COMPARISON:  Radiographs dated 10/31/2015 FINDINGS: There is a superficial soft tissue ulceration on the posterior aspect of the heel approximately 5 mm deep. There is slight edema in the adjacent subcutaneous fat. There is no abscess or osteomyelitis. No joint effusions. The achilles tendon at the tendons at the medial, lateral and anterior aspects of the ankle and hindfoot appear normal. IMPRESSION: Superficial soft tissue ulceration of the heel. No underlying abscess or osteomyelitis. Electronically Signed   By: Lorriane Shire M.D.   On: 11/02/2015 08:13   Mr Foot Left Wo Contrast  Result Date: 11/02/2015 CLINICAL DATA:  Diabetic patient with bilateral heel wounds. Septic shock. No known injury. Initial encounter. EXAM: MRI OF THE LEFT FOOT WITHOUT CONTRAST TECHNIQUE: Multiplanar, multisequence MR imaging was performed. No intravenous contrast was administered. COMPARISON:  Plain films left foot 10/31/2015. FINDINGS: A large skin ulceration seen over the dorsal margin of the calcaneus. Marrow edema is seen in the posterior aspect of the calcaneus most notable in the dorsal process and lateral calcaneus. No soft tissue abscess is identified. Mild edema is seen in the distal Achilles tendon eccentric with the medial side. Major ligaments and tendons about the ankle are intact. No joint effusion is  identified. A bone small infarct is seen in the lateral talar without fragmentation or collapse. Mild degenerative change at the calcaneocuboid joint is noted. IMPRESSION: Large skin wound over the heel with marrow edema in the posterior calcaneus subjacent to the wound consistent osteomyelitis. Negative for abscess or septic joint. Small bone infarct lateral talus. Electronically Signed   By: Inge Rise M.D.   On: 11/02/2015 08:16   Dg Chest Port 1 View  Result Date:  10/31/2015 CLINICAL DATA:  Hypotension EXAM: PORTABLE CHEST 1 VIEW COMPARISON:  10/19/2015 FINDINGS: Cardiac shadow is enlarged. Postsurgical changes are again seen. The lungs are well aerated bilaterally. Mild interstitial changes are again seen without acute abnormality. IMPRESSION: No acute abnormality noted. Electronically Signed   By: Inez Catalina M.D.   On: 10/31/2015 18:01   Dg Foot Complete Left  Result Date: 10/31/2015 CLINICAL DATA:  Wound of bilateral heels for weeks. EXAM: LEFT FOOT - COMPLETE 3+ VIEW COMPARISON:  03/13/2011 MRI FINDINGS: Status post amputation of the great toe the level of the distal metatarsal. There is significant soft tissue swelling in the region of the metatarsophalangeal joints. There is soft tissue swelling superficial to the calcaneus. Additionally, foci of air are identified adjacent to the calcaneus consistent with ulceration. IMPRESSION: 1. Significant soft tissue swelling. 2. Soft tissue ulceration superficial to the calcaneus. 3. Status post great toe amputation. Electronically Signed   By: Nolon Nations M.D.   On: 10/31/2015 22:03   Dg Foot Complete Right  Result Date: 10/31/2015 CLINICAL DATA:  Wounds of bilateral heels for weeks. EXAM: RIGHT FOOT COMPLETE - 3+ VIEW COMPARISON:  None. FINDINGS: Surgical clips are identified in the region the ankle. No acute fracture or subluxation. No cortical erosion. Soft tissue swelling superficial to the calcaneus. IMPRESSION: Soft tissue swelling. Electronically Signed   By: Nolon Nations M.D.   On: 10/31/2015 22:01    Lab Data:  CBC:  Recent Labs Lab 11/03/15 0251 11/04/15 0253 11/05/15 0748 11/06/15 1145 11/07/15 0952 11/08/15 0508 11/09/15 0445  WBC 11.4* 8.0 6.0 7.2 6.8 8.3 11.7*  NEUTROABS 8.8* 5.7 4.7  --   --   --   --   HGB 9.3* 9.4* 10.5* 9.6* 10.4* 9.9* 9.1*  HCT 32.4* 34.0* 35.2* 34.4* 35.3* 34.4* 32.6*  MCV 72.8* 74.7* 73.0* 74.8* 73.7* 74.9* 75.1*  PLT 205 158 116* 193 151 158 [id number]    Basic Metabolic Panel:  Recent Labs Lab 11/03/15 0251 11/04/15 0253 11/05/15 0748 11/06/15 1145 11/07/15 0952 11/08/15 0508 11/09/15 0445  NA 136 138 138 140 138 139 140  K 3.9 3.7 4.3 3.4* 3.7 3.1* 3.4*  CL 100* 101 106 103 102 96* 95*  CO2 30 26 25  33* 30 35* 35*  GLUCOSE 117* 97 120* 113* 170* 123* 169*  BUN 12 7 <5* <5* 7 9 8   CREATININE 0.83 0.70 0.65 0.71 0.67 0.78 0.83  CALCIUM 8.2* 8.3* 8.3* 8.4* 8.6* 8.6* 8.4*  MG 2.1 1.9 1.9  --  2.0  --   --    GFR: Estimated Creatinine Clearance: 92.7 mL/min (by C-G formula based on SCr of 0.83 mg/dL). Liver Function Tests:  Recent Labs Lab 11/03/15 0251 11/04/15 0253 11/05/15 0748 11/06/15 1145  AST 13* 13* 20 16  ALT 13* 12* 15* 16*  ALKPHOS 48 45 48 47  BILITOT 0.6 0.7 0.6 0.6  PROT 5.5* 5.2* 5.4* 5.8*  ALBUMIN 3.0* 2.8* 2.9* 2.7*   No results for input(s): LIPASE, AMYLASE in the last 168  hours. No results for input(s): AMMONIA in the last 168 hours. Coagulation Profile: No results for input(s): INR, PROTIME in the last 168 hours. Cardiac Enzymes: No results for input(s): CKTOTAL, CKMB, CKMBINDEX, TROPONINI in the last 168 hours. BNP (last 3 results) No results for input(s): PROBNP in the last 8760 hours. HbA1C: No results for input(s): HGBA1C in the last 72 hours. CBG:  Recent Labs Lab 11/08/15 1140 11/08/15 1635 11/08/15 2152 11/09/15 0613 11/09/15 1113  GLUCAP 138* 139* 132* 177* 179*   Lipid Profile: No results for input(s): CHOL, HDL, LDLCALC, TRIG, CHOLHDL, LDLDIRECT in the last 72 hours. Thyroid Function Tests: No results for input(s): TSH, T4TOTAL, FREET4, T3FREE, THYROIDAB in the last 72 hours. Anemia Panel: No results for input(s): VITAMINB12, FOLATE, FERRITIN, TIBC, IRON, RETICCTPCT in the last 72 hours. Urine analysis:    Component Value Date/Time   COLORURINE YELLOW 10/31/2015 1608   APPEARANCEUR CLOUDY (A) 10/31/2015 1608   LABSPEC 1.012 10/31/2015 1608   PHURINE 8.5 (H)  10/31/2015 1608   GLUCOSEU NEGATIVE 10/31/2015 1608   HGBUR NEGATIVE 10/31/2015 1608   HGBUR negative 03/06/2010 1419   BILIRUBINUR NEGATIVE 10/31/2015 1608   KETONESUR NEGATIVE 10/31/2015 1608   PROTEINUR NEGATIVE 10/31/2015 1608   UROBILINOGEN 0.2 09/13/2014 0945   NITRITE NEGATIVE 10/31/2015 1608   LEUKOCYTESUR SMALL (A) 10/31/2015 1608     Laurabelle Gorczyca M.D. [facility] 11/09/2015, 12:09 PM  Pager: [phone] Between 7am to 7pm - call Pager - [phone]  After 7pm go to [email or web address] - password [email or web address]  Call night coverage person covering after 7pm

## 2015-11-10 LAB — BASIC METABOLIC PANEL
ANION GAP: 7 (ref 5–15)
BUN: 10 mg/dL (ref 6–20)
CO2: 35 mmol/L — ABNORMAL HIGH (ref 22–32)
Calcium: 8.5 mg/dL — ABNORMAL LOW (ref 8.9–10.3)
Chloride: 94 mmol/L — ABNORMAL LOW (ref 101–111)
Creatinine, Ser: 0.79 mg/dL (ref 0.61–1.24)
GFR calc Af Amer: >60 mL/min (ref >60)
Glucose, Bld: 146 mg/dL — ABNORMAL HIGH (ref 65–99)
POTASSIUM: 3.7 mmol/L (ref 3.5–5.1)
SODIUM: 136 mmol/L (ref 135–145)

## 2015-11-10 LAB — CBC
HCT: 31.5 % — ABNORMAL LOW (ref 39.0–52.0)
Hemoglobin: 8.9 g/dL — ABNORMAL LOW (ref 13.0–17.0)
MCH: 21.3 pg — ABNORMAL LOW (ref 26.0–34.0)
MCHC: 28.3 g/dL — ABNORMAL LOW (ref 30.0–36.0)
MCV: 75.5 fL — AB (ref 78.0–100.0)
PLATELETS: 164 10*3/uL (ref 150–400)
RBC: 4.17 MIL/uL — AB (ref 4.22–5.81)
RDW: 23.9 % — ABNORMAL HIGH (ref 11.5–15.5)
WBC: 10.5 10*3/uL (ref 4.0–10.5)

## 2015-11-10 LAB — GLUCOSE, CAPILLARY
GLUCOSE-CAPILLARY: 142 mg/dL — AB (ref 65–99)
Glucose-Capillary: 144 mg/dL — ABNORMAL HIGH (ref 65–99)

## 2015-11-10 MED ORDER — HYDROCODONE-ACETAMINOPHEN 7.5-325 MG PO TABS: 1.0000 | ORAL_TABLET | ORAL | 0 refills | Status: DC | PRN

## 2015-11-10 MED ORDER — ASPIRIN 81 MG PO TBEC: 81.0000 mg | DELAYED_RELEASE_TABLET | Freq: Every day | ORAL | Status: DC

## 2015-11-10 MED ORDER — METHOCARBAMOL 500 MG PO TABS
500.0000 mg | ORAL_TABLET | Freq: Three times a day (TID) | ORAL | Status: DC
  Administered 2015-11-10: 500 mg via ORAL
  Filled 2015-11-10: qty 1

## 2015-11-10 MED ORDER — SENNOSIDES-DOCUSATE SODIUM 8.6-50 MG PO TABS: 1.0000 | ORAL_TABLET | Freq: Two times a day (BID) | ORAL | Status: DC

## 2015-11-10 MED ORDER — BISACODYL 10 MG RE SUPP: 10.0000 mg | Freq: Every day | RECTAL | 0 refills | Status: AC | PRN

## 2015-11-10 MED ORDER — LORAZEPAM 0.5 MG PO TABS: 0.5000 mg | ORAL_TABLET | Freq: Two times a day (BID) | ORAL | 0 refills | Status: DC | PRN

## 2015-11-10 MED ORDER — GUAIFENESIN-DM 100-10 MG/5ML PO SYRP: 15.0000 mL | ORAL_SOLUTION | ORAL | 0 refills | Status: DC | PRN

## 2015-11-10 MED ORDER — HYDROCODONE-ACETAMINOPHEN 7.5-325 MG PO TABS
1.0000 | ORAL_TABLET | ORAL | Status: DC | PRN
Start: 2015-11-10 — End: 2015-11-10
  Administered 2015-11-10: 2 via ORAL
  Filled 2015-11-10: qty 2

## 2015-11-10 MED ORDER — COLLAGENASE 250 UNIT/GM EX OINT: 1.0000 "application " | TOPICAL_OINTMENT | Freq: Every day | CUTANEOUS | 0 refills | Status: DC

## 2015-11-10 MED ORDER — INSULIN NPH (HUMAN) (ISOPHANE) 100 UNIT/ML ~~LOC~~ SUSP
20.0000 [IU] | Freq: Two times a day (BID) | SUBCUTANEOUS | 0 refills | Status: DC
Start: 2015-11-10 — End: 2016-08-19

## 2015-11-10 MED ORDER — SIMVASTATIN 40 MG PO TABS: 40.0000 mg | ORAL_TABLET | Freq: Every day | ORAL | 1 refills | Status: DC

## 2015-11-10 MED ORDER — METHOCARBAMOL 500 MG PO TABS: 500.0000 mg | ORAL_TABLET | Freq: Three times a day (TID) | ORAL | 0 refills | Status: DC

## 2015-11-10 MED ORDER — PRO-STAT SUGAR FREE PO LIQD: 30.0000 mL | Freq: Two times a day (BID) | ORAL | 0 refills | Status: AC

## 2015-11-10 NOTE — Progress Notes (Signed)
[facility] given

## 2015-11-10 NOTE — Progress Notes (Addendum)
[facility]  Subjective  - Pain issue he states he has been awake since 3 am.   Objective (!) 105/49 93 97.7 F (36.5 C) (Oral) 20 95%  Intake/Output Summary (Last 24 hours) at 11/10/15 0750 Last data filed at 11/10/15 0600  Gross per 24 hour  Intake          1172.17 ml  Output              750 ml  Net           422.17 ml    Right AKA dressing changed this am, incision healing well.  No erythema or drainage. Right hel in protective boot, dry dressing in place  Assessment/Planning: POD # 2 left AKA  WBC decreased and afebrile  Will order AKA sock from [employer] Maintain right heel off bed dry dressing daily  Theda Sers, EMMA [facility] 11/10/2015 7:50 AM --  Laboratory Lab Results:  Recent Labs  11/09/15 0445 11/10/15 0600  WBC 11.7* 10.5  HGB 9.1* 8.9*  HCT 32.6* 31.5*  PLT 171 164   BMET  Recent Labs  11/09/15 0445 11/10/15 0600  NA 140 136  K 3.4* 3.7  CL 95* 94*  CO2 35* 35*  GLUCOSE 169* 146*  BUN 8 10  CREATININE 0.83 0.79  CALCIUM 8.4* 8.5*    COAG Lab Results  Component Value Date   INR 1.39 10/19/2015   INR 1.7 (A) 08/13/2015   INR 3.5 (A) 08/01/2015   PROTIME 19.9 (A) 08/13/2015   PROTIME 35.5 (A) 08/01/2015   PROTIME 21.0 (A) 07/09/2015   No results found for: PTT   Addendum  I have independently interviewed and examined the patient, and I agree with the physician assistant's findings.  Ok to go to SNF from vascular viewpoint.  Staples out in 4 weeks.  Pt to see Dr. Donzetta Matters in office in 4 weeks.  Adele Barthel, MD [facility] Office: [phone] Pager: [phone]  11/10/2015, 9:29 AM

## 2015-11-10 NOTE — Progress Notes (Signed)
Orthopedic Tech Progress Note Patient Details:  [name] 01-24-1941 [id number]  Patient ID: [name], male   DOB: 23-Oct-1940, 75 y.o.   MRN: [id number]   [name] 11/10/2015, 10:13 AM Called in bio tech brace order; spoke with Louie Casa

## 2015-11-10 NOTE — Clinical Social Work Note (Signed)
Clinical Social Worker facilitated patient discharge including contacting patient family and facility to confirm patient discharge plans.  Clinical information faxed to facility and family agreeable with plan.  CSW arranged ambulance transport via [facility] to [place].  RN to call report prior to discharge.  Clinical Social Worker will sign off for now as social work intervention is no longer needed. Please consult us again if new need arises.  Jesse Johnnie Moten, LCSW [phone] 

## 2015-11-10 NOTE — Discharge Summary (Addendum)
Physician Discharge Summary   Patient ID: [name] MRN: [id number] DOB/AGE: [birth year] 77 y.o.  Admit date: 10/31/2015 Discharge date: 11/10/2015  Primary Care Physician:  [name], MD  Discharge Diagnoses:    . Septic shock ([place]) . Chronic systolic CHF (congestive heart failure) ([place]) . Type II diabetes mellitus with neurological manifestations ([place]) . AKI (acute kidney injury) ([place]) . Diabetic ulcer of left foot ([place]) . Ulcer of foot due to diabetes ([place]) . Acute renal failure ([place]) . Gangrene ([place]) . Chronic atrial fibrillation ([place]) . Uncontrolled type 2 diabetes mellitus with complication ([place]) . Chronic osteomyelitis of left foot ([place]) . Panlobular emphysema [facility])   Consults:Vascular surgery Inpatient rehabilitation  Recommendations for Outpatient Follow-up:  1. Please repeat CBC/BMET at next visit   DIET: Carb modified diet    Allergies:   Allergies  Allergen Reactions  . Diazepam Anxiety    REACTION: makes patient cry  . Vancomycin Other (See Comments)    "red man syndrome"     DISCHARGE MEDICATIONS: Current Discharge Medication List    START taking these medications   Details  Amino Acids-Protein Hydrolys (FEEDING SUPPLEMENT, PRO-STAT SUGAR FREE 64,) LIQD Take 30 mLs by mouth 2 (two) times daily. Qty: 900 mL, Refills: 0    aspirin EC 81 MG EC tablet Take 1 tablet (81 mg total) by mouth daily.    bisacodyl (DULCOLAX) 10 MG suppository Place 1 suppository (10 mg total) rectally daily as needed for moderate constipation. Qty: 12 suppository, Refills: 0    guaiFENesin-dextromethorphan (ROBITUSSIN DM) 100-10 MG/5ML syrup Take 15 mLs by mouth every 4 (four) hours as needed for cough. Qty: 118 mL, Refills: 0    methocarbamol (ROBAXIN) 500 MG tablet Take 1 tablet (500 mg total) by mouth 3 (three) times daily. Qty: 30 tablet, Refills: 0    senna-docusate (SENOKOT-S) 8.6-50 MG tablet Take 1 tablet by mouth 2 (two) times daily.      CONTINUE  these medications which have CHANGED   Details  collagenase (SANTYL) ointment Apply 1 application topically daily. Apply Santyl to left and right heel wounds Q day, then cover with moist gauze and dry kerlex Qty: 15 g, Refills: 0    HYDROcodone-acetaminophen (NORCO) 7.5-325 MG tablet Take 1 tablet by mouth every 4 (four) hours as needed for moderate pain or severe pain. Qty: 30 tablet, Refills: 0    insulin NPH Human (HUMULIN N,NOVOLIN N) 100 UNIT/ML injection Inject 0.2 mLs (20 Units total) into the skin 2 (two) times daily before a meal. Qty: 10 mL, Refills: 0   Associated Diagnoses: Type II diabetes mellitus with neurological manifestations (HCC)    LORazepam (ATIVAN) 0.5 MG tablet Take 1 tablet (0.5 mg total) by mouth 2 (two) times daily as needed for anxiety. Qty: 20 tablet, Refills: 0    simvastatin (ZOCOR) 40 MG tablet Take 1 tablet (40 mg total) by mouth at bedtime. Qty: 30 tablet, Refills: 1      CONTINUE these medications which have NOT CHANGED   Details  carvedilol (COREG) 3.125 MG tablet Take 1 tablet (3.125 mg total) by mouth 2 (two) times daily with a meal. Qty: 60 tablet, Refills: 0    docusate sodium (COLACE) 100 MG capsule Take 100 mg by mouth 2 (two) times daily.    DULoxetine (CYMBALTA) 30 MG capsule Take 90 mg by mouth daily.    ferrous sulfate 325 (65 FE) MG tablet Take 1 tablet (325 mg total) by mouth daily with breakfast. Qty: 30 tablet, Refills: 0  furosemide (LASIX) 80 MG tablet Take 1 tablet (80 mg total) by mouth 2 (two) times daily. Qty: 60 tablet, Refills: 0    gabapentin (NEURONTIN) 400 MG capsule Take 400 mg by mouth 3 (three) times daily.    lidocaine (LIDODERM) 5 % Place 1 patch onto the skin daily.     lubiprostone (AMITIZA) 8 MCG capsule Take 8 mcg by mouth 2 (two) times daily with a meal.    Multiple Vitamins-Minerals (DECUBI-VITE PO) Take 1 tablet by mouth daily.     nitroGLYCERIN (NITROSTAT) 0.4 MG SL tablet Place 1 tablet (0.4 mg  total) under the tongue as needed. For chest pains Qty: 30 tablet, Refills: 6    omeprazole (PRILOSEC) 40 MG capsule Take 1 capsule (40 mg total) by mouth daily. Qty: 30 capsule, Refills: 3    OXYGEN Inhale 2 L into the lungs as needed (for congestion, cough, and wheezing related to COPD).    Propylene Glycol (SYSTANE BALANCE) 0.6 % SOLN Place 1 drop into both eyes 2 (two) times daily.     PROTEIN PO Take 60 mLs by mouth 3 (three) times daily.     rivaroxaban (XARELTO) 20 MG TABS tablet Take 20 mg by mouth daily with supper.    tamsulosin (FLOMAX) 0.4 MG CAPS capsule Take 1 capsule (0.4 mg total) by mouth daily after supper. Qty: 90 capsule, Refills: 1    traZODone (DESYREL) 100 MG tablet Take 100 mg by mouth at bedtime.     Vitamin D, Ergocalciferol, (DRISDOL) 50000 units CAPS capsule Take 1 capsule (50,000 Units total) by mouth every 7 (seven) days. Qty: 30 capsule, Refills: prn   Associated Diagnoses: Vitamin D deficiency    insulin lispro (HUMALOG) 100 UNIT/ML cartridge Inject into the skin. Inject as per sliding scale: if  0-70=0; 71-149 = 8 units, 150-600= 13 units subcutaneously before meals related to DM.    Skin Protectants, Misc. (CALAZIME SKIN PROTECTANT EX) Apply topically. Apply to sacrum and right buttock every day shift      STOP taking these medications     spironolactone (ALDACTONE) 25 MG tablet      lisinopril (PRINIVIL,ZESTRIL) 2.5 MG tablet      feeding supplement, GLUCERNA SHAKE, (GLUCERNA SHAKE) LIQD          Brief H and P: For complete details please refer to admission H and P, but in [place] 75 y.o.M HxDepression, Anxiety, Neuromuscular disorder, CVA, Morbid obesity, DM2, Chronic Systolic CHF, Chronic Atrial Fibrillation, CAD, HTN, HLD, COPD on 1.5 L O2 PRN, OSA, MRSA bacteremia in 2011 w/ Osteomyelitis of the sternoclavicular joint, Frequent Falls, and Chronic Back pain who was just discharged 2 days prior to this admit after being treated for a COPD  exacerbation and congestive heart failure exacerbation. During that time he was treated with aggressive diuresis and his diuretics were adjusted on discharge. On the day of his readmission he started feeling weak w/ a headache and neck pain. He then passed out while sitting in a chair in his nursing home so he was sent to [facility].  In the ED he was noted to have leukocytosis, hypotension, and acute kidney injury, as well as chronic wounds on his legs and sacral decubitus ulcers. The patient was hypotensive in the ED and required volume resuscitation.   Hospital Course:  Septic shock / Bilateral foot Osteomyelitis vs Gangrene - post op Day #2 -Vascular surgery was consulted and patient underwent angiogram on 8/15. 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 Per vascular  surgery he is cleared to be discharged, does not need IV antibiotics at this point.  - [place] out in 4 weeks, patient to see Dr. Donzetta Matters in office in 4 weeks Blood culture on 7/29 had shown coagulase negative staph.  Chronic combined systolic and diastolic CHF -EF [id number] via TTE 10/22/15 w/ grade 2 DD  -  no ACEi/ARB or Aldactone for now due to recent acute renal failure , restarted low dose coreg - Positive balance of 5L, continue Lasix 80 mg twice a day, follow BMET  HTN -BP soft  Chronic atrial fibrillation -Restarted xarelto. Patient was placed on heparin drip while xarelto was held during hospitalization for procedures and surgery. - Rate controlled  Acute renal failure -most likely secondary to septic shock - resolved   COPD -Titrate O2 to maintain SPO2 89-93% - well compensated at this time  DM2 -2/17 A1c 7.4 - CBG currently reasonably controlled   Anxiety/Depression -well controlled at this time   Microcytic Anemia -no evidence of acute blood loss  - Anemia panel consistent with anemia of chronic disease does have component of iron deficiency as well, avoid IV Fe in setting of  infection  Morbid obesity Diet and weight control consult  Hypokalemia  Day of Discharge BP (!) 105/49 (BP Location: Left Arm)   Pulse 93   Temp 97.7 F (36.5 C) (Oral)   Resp 20   Ht 5\' 8"  (1.727 m)   Wt 113.7 kg (250 lb 10.6 oz)   SpO2 95%   BMI 40.46 kg/m   Physical Exam: General: Alert and awake oriented x3 not in any acute distress. HEENT: CVS: S1-S2 clear no murmur rubs or gallops Chest: clear to auscultation bilaterally, no wheezing rales or rhonchi Abdomen: soft nontender, nondistended, normal bowel sounds Extremities: Left AKA Neuro: Cranial nerves II-XII intact, no focal neurological deficits   The results of significant diagnostics from this hospitalization (including imaging, microbiology, ancillary and laboratory) are listed below for reference.    LAB RESULTS: Basic Metabolic Panel:  Recent Labs Lab 11/07/15 0952  11/09/15 0445 11/10/15 0600  NA 138  < > 140 136  K 3.7  < > 3.4* 3.7  CL 102  < > 95* 94*  CO2 30  < > 35* 35*  GLUCOSE 170*  < > 169* 146*  BUN 7  < > 8 10  CREATININE 0.67  < > 0.83 0.79  CALCIUM 8.6*  < > 8.4* 8.5*  MG 2.0  --   --   --   < > = values in this interval not displayed. Liver Function Tests:  Recent Labs Lab 11/05/15 0748 11/06/15 1145  AST 20 16  ALT 15* 16*  ALKPHOS 48 47  BILITOT 0.6 0.6  PROT 5.4* 5.8*  ALBUMIN 2.9* 2.7*   No results for input(s): LIPASE, AMYLASE in the last 168 hours. No results for input(s): AMMONIA in the last 168 hours. CBC:  Recent Labs Lab 11/05/15 0748  11/09/15 0445 11/10/15 0600  WBC 6.0  < > 11.7* 10.5  NEUTROABS 4.7  --   --   --   HGB 10.5*  < > 9.1* 8.9*  HCT 35.2*  < > 32.6* 31.5*  MCV 73.0*  < > 75.1* 75.5*  PLT 116*  < > 171 164  < > = values in this interval not displayed. Cardiac Enzymes: No results for input(s): CKTOTAL, CKMB, CKMBINDEX, TROPONINI in the last 168 hours. BNP: Invalid input(s): POCBNP CBG:  Recent Labs Lab 11/09/15 2102  11/10/15 0605   GLUCAP 166* 142*    Significant Diagnostic Studies:  Ct Head Wo Contrast  Result Date: 10/31/2015 CLINICAL DATA:  On blood thinner.  Headache. EXAM: CT HEAD WITHOUT CONTRAST TECHNIQUE: Contiguous axial images were obtained from the base of the skull through the vertex without intravenous contrast. COMPARISON:  08/06/2014 FINDINGS: Brain: No evidence of acute infarction, hemorrhage, hydrocephalus, or mass lesion/mass effect. Extensive remote left MCA territory infarction, upper division, extending into the posterior border zone. Remote small vessel infarcts in the bilateral cerebellum. Vascular: Atherosclerotic calcifications Skull: Negative for fracture or focal lesion. Sinuses/Orbits: Bilateral cataract resection. Other: None. IMPRESSION: 1. No acute finding or change compared to prior. 2. Remote left MCA territory infarction. Remote small bilateral cerebellar infarcts. Electronically Signed   By: Monte Fantasia M.D.   On: 10/31/2015 17:11   Mr Foot Right Wo Contrast  Result Date: 11/02/2015 CLINICAL DATA:  Soft tissue wound on the heel. EXAM: MRI OF THE RIGHT HIND IN FOOT WITHOUT CONTRAST TECHNIQUE: Multiplanar, multisequence MR imaging was performed. No intravenous contrast was administered. COMPARISON:  Radiographs dated 10/31/2015 FINDINGS: There is a superficial soft tissue ulceration on the posterior aspect of the heel approximately 5 mm deep. There is slight edema in the adjacent subcutaneous fat. There is no abscess or osteomyelitis. No joint effusions. The achilles tendon at the tendons at the medial, lateral and anterior aspects of the ankle and hindfoot appear normal. IMPRESSION: Superficial soft tissue ulceration of the heel. No underlying abscess or osteomyelitis. Electronically Signed   By: Lorriane Shire M.D.   On: 11/02/2015 08:13   Mr Foot Left Wo Contrast  Result Date: 11/02/2015 CLINICAL DATA:  Diabetic patient with bilateral heel wounds. Septic shock. No known injury. Initial  encounter. EXAM: MRI OF THE LEFT FOOT WITHOUT CONTRAST TECHNIQUE: Multiplanar, multisequence MR imaging was performed. No intravenous contrast was administered. COMPARISON:  Plain films left foot 10/31/2015. FINDINGS: A large skin ulceration seen over the dorsal margin of the calcaneus. Marrow edema is seen in the posterior aspect of the calcaneus most notable in the dorsal process and lateral calcaneus. No soft tissue abscess is identified. Mild edema is seen in the distal Achilles tendon eccentric with the medial side. Major ligaments and tendons about the ankle are intact. No joint effusion is identified. A bone small infarct is seen in the lateral talar without fragmentation or collapse. Mild degenerative change at the calcaneocuboid joint is noted. IMPRESSION: Large skin wound over the heel with marrow edema in the posterior calcaneus subjacent to the wound consistent osteomyelitis. Negative for abscess or septic joint. Small bone infarct lateral talus. Electronically Signed   By: Inge Rise M.D.   On: 11/02/2015 08:16   Dg Chest Port 1 View  Result Date: 10/31/2015 CLINICAL DATA:  Hypotension EXAM: PORTABLE CHEST 1 VIEW COMPARISON:  10/19/2015 FINDINGS: Cardiac shadow is enlarged. Postsurgical changes are again seen. The lungs are well aerated bilaterally. Mild interstitial changes are again seen without acute abnormality. IMPRESSION: No acute abnormality noted. Electronically Signed   By: Inez Catalina M.D.   On: 10/31/2015 18:01   Dg Foot Complete Left  Result Date: 10/31/2015 CLINICAL DATA:  Wound of bilateral heels for weeks. EXAM: LEFT FOOT - COMPLETE 3+ VIEW COMPARISON:  03/13/2011 MRI FINDINGS: Status post amputation of the great toe the level of the distal metatarsal. There is significant soft tissue swelling in the region of the metatarsophalangeal joints. There is soft tissue swelling superficial to the calcaneus. Additionally, foci of air  are identified adjacent to the calcaneus  consistent with ulceration. IMPRESSION: 1. Significant soft tissue swelling. 2. Soft tissue ulceration superficial to the calcaneus. 3. Status post great toe amputation. Electronically Signed   By: Nolon Nations M.D.   On: 10/31/2015 22:03   Dg Foot Complete Right  Result Date: 10/31/2015 CLINICAL DATA:  Wounds of bilateral heels for weeks. EXAM: RIGHT FOOT COMPLETE - 3+ VIEW COMPARISON:  None. FINDINGS: Surgical clips are identified in the region the ankle. No acute fracture or subluxation. No cortical erosion. Soft tissue swelling superficial to the calcaneus. IMPRESSION: Soft tissue swelling. Electronically Signed   By: Nolon Nations M.D.   On: 10/31/2015 22:01    2D ECHO:   Disposition and Follow-up: Discharge Instructions    Diet - low sodium heart healthy    Complete by:  As directed   Increase activity slowly    Complete by:  As directed       [place], MD Follow up in 4 week(s).   Specialties:  Vascular Surgery, Cardiology Why:  Our office will call you to arrange an appointment  Contact information: [address] [place] [address] [phone]        [facility], MD. Schedule an appointment as soon as possible for a visit in 2 week(s).   Specialty:  Family Medicine Contact information: [place] [address] [phone]            Time spent on Discharge: 40 minutes   Signed:   Yulitza Shorts M.D. [facility] 11/10/2015, 12:29 PM Pager: [phone]   Coding query Left heel unstageable pressure injury  Left foot Stage 3 pressure injury  Right heel 30% Stage 3 pressure injury 70% unstageable pressure injury  Patient was followed by wound care and vascular surgery through the admission.   Mario Voong M.D. [facility] 11/26/2015, 12:59 PM  Pager: [phone]

## 2015-11-13 ENCOUNTER — Encounter: Payer: Self-pay | Admitting: Internal Medicine

## 2015-11-13 ENCOUNTER — Non-Acute Institutional Stay (SKILLED_NURSING_FACILITY): Payer: Medicare Other | Admitting: Internal Medicine

## 2015-11-13 DIAGNOSIS — I10 Essential (primary) hypertension: Secondary | ICD-10-CM

## 2015-11-13 DIAGNOSIS — E1142 Type 2 diabetes mellitus with diabetic polyneuropathy: Secondary | ICD-10-CM | POA: Diagnosis not present

## 2015-11-13 DIAGNOSIS — J449 Chronic obstructive pulmonary disease, unspecified: Secondary | ICD-10-CM

## 2015-11-13 DIAGNOSIS — I5022 Chronic systolic (congestive) heart failure: Secondary | ICD-10-CM | POA: Diagnosis not present

## 2015-11-13 DIAGNOSIS — I739 Peripheral vascular disease, unspecified: Secondary | ICD-10-CM

## 2015-11-13 DIAGNOSIS — Z89612 Acquired absence of left leg above knee: Secondary | ICD-10-CM | POA: Diagnosis not present

## 2015-11-13 DIAGNOSIS — M159 Polyosteoarthritis, unspecified: Secondary | ICD-10-CM

## 2015-11-13 DIAGNOSIS — Z7901 Long term (current) use of anticoagulants: Secondary | ICD-10-CM | POA: Diagnosis not present

## 2015-11-13 DIAGNOSIS — F332 Major depressive disorder, recurrent severe without psychotic features: Secondary | ICD-10-CM | POA: Diagnosis not present

## 2015-11-13 DIAGNOSIS — I2782 Chronic pulmonary embolism: Secondary | ICD-10-CM

## 2015-11-13 DIAGNOSIS — M150 Primary generalized (osteo)arthritis: Secondary | ICD-10-CM

## 2015-11-13 NOTE — Progress Notes (Addendum)
Patient ID: [name], male   DOB: 11-13-1940, 75 y.o.   MRN: [id number]    DATE: 11/13/15  Location:    [facility] Room Number: 159 B Place of Service: SNF [phone])   Extended Emergency Contact Information Primary Emergency Contact: Girard, Dobis Address: [place], [place] [employer] Phone: [phone] Relation: Spouse Secondary Emergency Contact: Stedron,Cathy  [place] Mobile Phone: [phone] Relation: Daughter Preferred language: English  Advanced Directive information DNR  Chief Complaint  Patient presents with  . Readmit To SNF    HPI:  75 yo male long term resident seen today for readmission into SNF following hospital stay for septic shock, chronic osteomyelitis left foot, AKI, chronic systolic CHF, DM, left foot diabetic ulcer/gangrene, panlobar emphysema/[place] O2. He was just d/c'd from hospital prior to this admission for COPD and CHF exacerbation. He was seen by vascular sx and underwent left AKA 8/17th. No IV abx needed after sx. 2 D echo in July 2017 revealed EF 30-35% with grade 2 DD. Due to AKI, ACEi/ARB and aldactone not indicated. Low dose coreg started. Lasix continued. xeralto resumed prior to d/c. Vitamin B12 level 247; Hgb 8.9; albumin 2.7; K+ dropped as low as 3.1 -->3.7 with repletion. Pt presents to SNF for short term rehab-->long term care.  He c/o generalized pain today. No stump bleeding or d/c. He will f/u with ortho Dr Donzetta Matters in 4 weeks. Appetite ok. Sleeping ok. No falls. No nursing issues. CBG 171 today  Chronic systolic heart failure - stable on demadex  20 mg twice daily; ACEI stopped due to AKI. Low dose carvedilol 3.125mg  BID started  Hypertension - BP stable on low dose coreg, torsemide.  DM - CBG 171 today. A1c 7.4. He takes novolog 8 units prior to meals with an additional 5 units  for cbg >=150; NPH  22 units twice daily with meals. He has  neuropathy and takes neurontin 400 mg TID and cymbalta 90 mg daily. Off ACEI due to AKI  CAD - he is s/p CABG and stents.  no complaints of chest pain present; takes  Carvedilol, ASA and xeralto. has not req'd prn ntg  GERD - stable on prilosec 40 mg daily   Hx CVA - stable. Takes xeralto and ASA  Hx PE - coumadin changed to xeralto earlier this month due to difficulty with INR stability. No bleeding   Venous stasis dermatitis/LE ulcer/PAD - followed by vascular sx. Facility wound care following. He is s/p left AKA  Hyperlipidemia - stable on statin. LDL 87  BPH - sx's stable on flomax 0.4 mg daily   Osteoarthritis/chronic pain -  Uses lidoderm patch as directed to shoulder; norco 7.5/325 mg prior to therapy and at night with tid prn; He is taking robaxin for muscle spasms  MDD/Anxiety/insomnia - mood stable on trazodone 100 mg nightly; takes ativan 0.5 mg nightly; melatonin 3 mg nightly for sleep; cymbalta 90 mg daily  Vit D deficiency - stable on vit d 50,000 units weekly   Constipation - due to long term narcotic use. Takes amitiza  23mcg BID and colace BID  Anemia - Hgb 8.9. He takes iron supplement and MVI daily. B12 level 247  Past Medical History:  Diagnosis Date  . Adhesive capsulitis   . Angina   . Anxiety   . CAD (coronary artery disease)   . CHF (congestive heart failure) (  HCC)   . Chronic back pain   . Chronic kidney disease    hx of BPH  . COPD (chronic obstructive pulmonary disease) ([place])   . CVA (cerebral infarction) Questionable history  . Depression   . Diabetes mellitus   . Diverticulosis   . Falls frequently 06/2014  . GERD (gastroesophageal reflux disease)   . Hyperlipidemia   . Hypertension   . 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 Myocardial infarction ([place])   . Neuromuscular disorder ([place])    HX of diabetic periferal neuropathy  . On home oxygen therapy    "1.5L prn" (04/12/2014)  . Osteomyelitis ([name])  2012   Sternoclavicular joint   . PE (pulmonary embolism) 10-15 years ago   Lifelong Coumadin  . Peripheral vascular disease ([place])   . Shortness of breath   . Sleep apnea     Past Surgical History:  Procedure Laterality Date  . AMPUTATION  03/16/2011   Procedure: AMPUTATION DIGIT;  Surgeon: Angelia Mould, MD;  Location: [facility];  Service: Vascular;  Laterality: Left;  Great toe  . AMPUTATION Left 11/08/2015   Procedure: AMPUTATION ABOVE KNEE LEFT;  Surgeon: Waynetta Sandy, MD;  Location: [facility];  Service: Vascular;  Laterality: Left;  . CARDIAC CATHETERIZATION  October 18, 2010   Known obstructive disease, no further blockages  . CARDIAC CATHETERIZATION  03/17/2005   EF 35-40%  . COLONOSCOPY  2006    Multiple polyps removed, repeat in 5 years  . CORONARY ARTERY BYPASS GRAFT  1992  . [place], 2002  . DOPPLER ECHOCARDIOGRAPHY  Sept 2011   EF 50-55% with some impaired diastolic relaxation  . FEMORAL-POPLITEAL BYPASS GRAFT  2008   Left  . FRACTURE SURGERY  1980s   Hip  . HIP ARTHROPLASTY Left   . PERIPHERAL VASCULAR CATHETERIZATION N/A 11/06/2015   Procedure: Abdominal Aortogram;  Surgeon: Serafina Mitchell, MD;  Location: [facility];  Service: Cardiovascular;  Laterality: N/A;  . PERIPHERAL VASCULAR CATHETERIZATION Bilateral 11/06/2015   Procedure: Lower Extremity Angiography;  Surgeon: Serafina Mitchell, MD;  Location: [facility];  Service: Cardiovascular;  Laterality: Bilateral;  . Right iliopopliteal bypass - [place]  . septic arthritis     Removal of infected CABG wire by Dr Arlyce Dice - 2011  . SPINE SURGERY  2002   Cervical fusion vertebroplasty C3-4-5  . US ECHOCARDIOGRAPHY  03/04/2006   EF 50-55%    Patient Care Team: Alveda Reasons, MD as PCP - General Darlin Coco, MD (Cardiology) Clent Jacks, MD (Ophthalmology) Christin Fudge, MD as Consulting Physician (Surgery)  Social History   Social History  . Marital  status: Married    Spouse name: Butch Penny  . Number of children: 3  . Years of education: 12   Occupational History  . Retired-machinest/welder Retired   Social History Main Topics  . Smoking status: Former Smoker    Quit date: 06/19/2000  . Smokeless tobacco: Never Used  . Alcohol use No  . Drug use: No  . Sexual activity: No   Other Topics Concern  . Not on file   Social History Narrative   Health Care POA: TBD   Emergency Contact: wife, Butch Penny [phone]   End of Life Plan: pt does not have AD and not interested.   Who lives with you: Butch Penny and step-daughter-Betty   Any pets: none   Diet: Pt has a  varied diet of protein, starch and vegetables.   Exercise: Pt does not have regular exercise routine.   Seatbelts: Pt reports wearing seatbelt when in vehicles.    Hobbies: listening to car races.               reports that he quit smoking about 15 years ago. He has never used smokeless tobacco. He reports that he does not drink alcohol or use drugs.  Family History  Problem Relation Age of Onset  . Heart disease Father   . Heart disease Mother    Family Status  Relation Status  . Father Deceased  . Mother Deceased    Immunization History  Administered Date(s) Administered  . Influenza Split 12/12/2010  . Influenza Whole 12/22/2005, 12/14/2007  . Influenza,inj,Quad PF,36+ Mos 01/20/2013, 12/09/2013  . Influenza-Unspecified 12/15/2014  . Pneumococcal Conjugate-13 10/10/2013  . Pneumococcal Polysaccharide-23 11/23/1999, 03/25/2003, 05/25/2011  . Td 10/23/2003  . Tdap 07/17/2014    Allergies  Allergen Reactions  . Diazepam Anxiety    REACTION: makes patient cry  . Vancomycin Other (See Comments)    "red man syndrome"    Medications: Patient's Medications  New Prescriptions   No medications on file  Previous Medications   AMINO ACIDS-PROTEIN HYDROLYS (FEEDING SUPPLEMENT, PRO-STAT SUGAR FREE 64,) LIQD    Take 30 mLs by mouth 2 (two) times daily.   ASPIRIN EC 81  MG EC TABLET    Take 1 tablet (81 mg total) by mouth daily.   ATORVASTATIN (LIPITOR) 20 MG TABLET    Take 20 mg by mouth daily.   BISACODYL (DULCOLAX) 10 MG SUPPOSITORY    Place 1 suppository (10 mg total) rectally daily as needed for moderate constipation.   CARVEDILOL (COREG) 3.125 MG TABLET    Take 1 tablet (3.125 mg total) by mouth 2 (two) times daily with a meal.   COLLAGENASE (SANTYL) OINTMENT    Apply 1 application topically daily. Apply Santyl to left and right heel wounds Q day, then cover with moist gauze and dry kerlex   DOCUSATE SODIUM (COLACE) 100 MG CAPSULE    Take 100 mg by mouth 2 (two) times daily.   DULOXETINE (CYMBALTA) 30 MG CAPSULE    Take 90 mg by mouth daily.   FERROUS SULFATE 325 (65 FE) MG TABLET    Take 1 tablet (325 mg total) by mouth daily with breakfast.   FUROSEMIDE (LASIX) 80 MG TABLET    Take 1 tablet (80 mg total) by mouth 2 (two) times daily.   GABAPENTIN (NEURONTIN) 400 MG CAPSULE    Take 400 mg by mouth 3 (three) times daily.   GUAIFENESIN-DEXTROMETHORPHAN (ROBITUSSIN DM) 100-10 MG/5ML SYRUP    Take 15 mLs by mouth every 4 (four) hours as needed for cough.   HYDROCODONE-ACETAMINOPHEN (NORCO) 7.5-325 MG TABLET    Take 1 tablet by mouth every 4 (four) hours as needed for moderate pain or severe pain.   INSULIN LISPRO (HUMALOG) 100 UNIT/ML CARTRIDGE    Inject into the skin. Inject as per sliding scale: if  0-70=0; 71-149 = 8 units, 150-600= 13 units subcutaneously before meals related to DM.   INSULIN NPH HUMAN (HUMULIN N,NOVOLIN N) 100 UNIT/ML INJECTION    Inject 0.2 mLs (20 Units total) into the skin 2 (two) times daily before a meal.   LIDOCAINE (LIDODERM) 5 %    Place 1 patch onto the skin daily.    LORAZEPAM (ATIVAN) 0.5 MG TABLET    Take 1 tablet (0.5 mg total)  by mouth 2 (two) times daily as needed for anxiety.   LUBIPROSTONE (AMITIZA) 8 MCG CAPSULE    Take 8 mcg by mouth 2 (two) times daily with a meal.   METHOCARBAMOL (ROBAXIN) 500 MG TABLET    Take 1 tablet  (500 mg total) by mouth 3 (three) times daily.   MULTIPLE VITAMINS-MINERALS (DECUBI-VITE PO)    Take 1 tablet by mouth daily.    NITROGLYCERIN (NITROSTAT) 0.4 MG SL TABLET    Place 1 tablet (0.4 mg total) under the tongue as needed. For chest pains   OMEPRAZOLE (PRILOSEC) 40 MG CAPSULE    Take 1 capsule (40 mg total) by mouth daily.   OXYGEN    Inhale 2 L into the lungs as needed (for congestion, cough, and wheezing related to COPD).   PROPYLENE GLYCOL (SYSTANE BALANCE) 0.6 % SOLN    Place 1 drop into both eyes 2 (two) times daily.    RIVAROXABAN (XARELTO) 20 MG TABS TABLET    Take 20 mg by mouth daily with supper.   SENNA-DOCUSATE (SENOKOT-S) 8.6-50 MG TABLET    Take 1 tablet by mouth 2 (two) times daily.   SIMVASTATIN (ZOCOR) 40 MG TABLET    Take 1 tablet (40 mg total) by mouth at bedtime.   SKIN PROTECTANTS, MISC. (CALAZIME SKIN PROTECTANT EX)    Apply topically. Apply to sacrum and right buttock every day shift   TAMSULOSIN (FLOMAX) 0.4 MG CAPS CAPSULE    Take 1 capsule (0.4 mg total) by mouth daily after supper.   TRAZODONE (DESYREL) 100 MG TABLET    Take 100 mg by mouth at bedtime.    VITAMIN D, ERGOCALCIFEROL, (DRISDOL) 50000 UNITS CAPS CAPSULE    Take 1 capsule (50,000 Units total) by mouth every 7 (seven) days.  Modified Medications   No medications on file  Discontinued Medications   PROTEIN PO    Take 60 mLs by mouth 3 (three) times daily.     Review of Systems  Musculoskeletal: Positive for arthralgias and gait problem.  Neurological: Positive for numbness.  All other systems reviewed and are negative.   Vitals:   11/13/15 1422  BP: (!) 109/55  Pulse: 84  Resp: 18  Temp: 97.1 F (36.2 C)  TempSrc: Oral  SpO2: 96%  Weight: 239 lb 6.4 oz (108.6 kg)  Height: 5\' 6"  (1.676 m)   Body mass index is 38.64 kg/m.  Physical Exam  Constitutional: He appears well-developed and well-nourished.  Sitting in w/c in NAD  HENT:  Mouth/Throat: Oropharynx is clear and moist.    Eyes: Pupils are equal, round, and reactive to light. No scleral icterus.  Neck: Neck supple. Carotid bruit is not present. No thyromegaly present.  Cardiovascular: Normal rate, regular rhythm and intact distal pulses.  Exam reveals no gallop and no friction rub.   Murmur (1/6 SEM) heard. +1 pitting RLE edema with chronic venous stasis changes. No calf TTP. Left AKA dsg c/d/i  Pulmonary/Chest: Effort normal. He has no wheezes. He has no rales. He exhibits no tenderness.  Reduced BS at base b/l.   Abdominal: Soft. Bowel sounds are normal. He exhibits no distension, no abdominal bruit, no pulsatile midline mass and no mass. There is no tenderness. There is no rebound and no guarding.  Musculoskeletal: He exhibits edema and tenderness.  Left AKA  Lymphadenopathy:    He has no cervical adenopathy.  Neurological: He is alert.  Skin: Skin is warm and dry. No rash noted.  Chronic venous stasis  changes RLE  Psychiatric: He has a normal mood and affect. His behavior is normal.     Labs reviewed: Nursing Home on 11/13/2015  Component Date Value Ref Range Status  . Triglycerides 09/21/2015 100  40 - 160 mg/dL Final  . Cholesterol 09/21/2015 104  0 - 200 mg/dL Final  . HDL 09/21/2015 28* 35 - 70 mg/dL Final  . LDL Cholesterol 09/21/2015 56  mg/dL Final  Admission on 10/31/2015, Discharged on 11/10/2015  No results displayed because visit has over 200 results.  CBC Latest Ref Rng & Units 11/10/2015 11/09/2015 11/08/2015  WBC 4.0 - 10.5 K/uL 10.5 11.7(H) 8.3  Hemoglobin 13.0 - 17.0 g/dL 8.9(L) 9.1(L) 9.9(L)  Hematocrit 39.0 - 52.0 % 31.5(L) 32.6(L) 34.4(L)  Platelets 150 - 400 K/uL 164 171 158   CMP Latest Ref Rng & Units 11/10/2015 11/09/2015 11/08/2015  Glucose 65 - 99 mg/dL 146(H) 169(H) 123(H)  BUN 6 - 20 mg/dL 10 8 9   Creatinine 0.61 - 1.24 mg/dL 0.79 0.83 0.78  Sodium 135 - 145 mmol/L 136 140 139  Potassium 3.5 - 5.1 mmol/L 3.7 3.4(L) 3.1(L)  Chloride 101 - 111 mmol/L 94(L) 95(L) 96(L)   CO2 22 - 32 mmol/L 35(H) 35(H) 35(H)  Calcium 8.9 - 10.3 mg/dL 8.5(L) 8.4(L) 8.6(L)  Total Protein 6.5 - 8.1 g/dL - - -  Total Bilirubin 0.3 - 1.2 mg/dL - - -  Alkaline Phos 38 - 126 U/L - - -  AST 15 - 41 U/L - - -  ALT 17 - 63 U/L - - -     Admission on 10/19/2015, Discharged on 10/29/2015  No results displayed because visit has over 200 results.    Hospital Outpatient Visit on 10/19/2015  Component Date Value Ref Range Status  . Right CCA prox sys 10/19/2015 91  cm/s Final  . Right CCA prox dias 10/19/2015 13  cm/s Final  . Right cca dist sys 10/19/2015 -86  cm/s Final  . Left CCA prox sys 10/19/2015 83  cm/s Final  . Left CCA prox dias 10/19/2015 19  cm/s Final  . Left CCA dist sys 10/19/2015 68  cm/s Final  . Left CCA dist dias 10/19/2015 17  cm/s Final  . Left ICA prox sys 10/19/2015 47  cm/s Final  . Left ICA prox dias 10/19/2015 11  cm/s Final  . Left ICA dist sys 10/19/2015 -64  cm/s Final  . Left ICA dist dias 10/19/2015 -26  cm/s Final  . RIGHT CCA MID DIAS 10/19/2015 19.00  cm/s Final  . RIGHT ECA DIAS 10/19/2015 1.00  cm/s Final  . LEFT ECA DIAS 10/19/2015 9.00  cm/s Final  Nursing Home on 09/20/2015  Component Date Value Ref Range Status  . INR 08/13/2015 1.7* 0.9 - 1.1 Final  . Protime 08/13/2015 19.9* 10.0 - 13.8 seconds Final  . INR 08/01/2015 3.5* 0.9 - 1.1 Final  . Hemoglobin 07/26/2015 12.3* 13.5 - 17.5 g/dL Final  . HCT 07/26/2015 37* 41 - 53 % Final  . Platelets 07/26/2015 106* 150 - 399 K/L Final  . WBC 07/26/2015 7.7  10^3/mL Final  . Protime 08/01/2015 35.5* 10.0 - 13.8 seconds Final  . Glucose 07/26/2015 161  mg/dL Final  . BUN 07/26/2015 37* 4 - 21 mg/dL Final  . Creatinine 07/26/2015 1.6* 0.6 - 1.3 mg/dL Final  . Potassium 07/26/2015 4.3  3.4 - 5.3 mmol/L Final  . Sodium 07/26/2015 136* 137 - 147 mmol/L Final    Dg Chest 2 View  Result Date: 10/19/2015 CLINICAL DATA:  Productive cough. EXAM: CHEST  2 VIEW COMPARISON:  08/24/2014 FINDINGS:  Postsurgical changes from CABG is stable. Cardiomediastinal silhouette is mildly enlarged. Mediastinal contours appear intact. There is no evidence of focal airspace consolidation, pleural effusion or pneumothorax. Mild pulmonary vascular congestion. Osseous structures are without acute abnormality. Soft tissues are grossly normal. IMPRESSION: Mildly enlarged cardiac silhouette. Mild pulmonary vascular congestion. Electronically Signed   By: Fidela Salisbury M.D.   On: 10/19/2015 17:29  Ct Head Wo Contrast  Result Date: 10/31/2015 CLINICAL DATA:  On blood thinner.  Headache. EXAM: CT HEAD WITHOUT CONTRAST TECHNIQUE: Contiguous axial images were obtained from the base of the skull through the vertex without intravenous contrast. COMPARISON:  08/06/2014 FINDINGS: Brain: No evidence of acute infarction, hemorrhage, hydrocephalus, or mass lesion/mass effect. Extensive remote left MCA territory infarction, upper division, extending into the posterior border zone. Remote small vessel infarcts in the bilateral cerebellum. Vascular: Atherosclerotic calcifications Skull: Negative for fracture or focal lesion. Sinuses/Orbits: Bilateral cataract resection. Other: None. IMPRESSION: 1. No acute finding or change compared to prior. 2. Remote left MCA territory infarction. Remote small bilateral cerebellar infarcts. Electronically Signed   By: Monte Fantasia M.D.   On: 10/31/2015 17:11   Ct Angio Chest Pe W And/or Wo Contrast  Result Date: 10/19/2015 CLINICAL DATA:  Shortness of Breath with productive cough EXAM: CT ANGIOGRAPHY CHEST WITH CONTRAST TECHNIQUE: Multidetector CT imaging of the chest was performed using the standard protocol during bolus administration of intravenous contrast. Multiplanar CT image reconstructions and MIPs were obtained to evaluate the vascular anatomy. CONTRAST:  80 mL Isovue 370. COMPARISON:  None. FINDINGS: Mediastinum/Lymph Nodes: The thoracic inlet is within normal limits. No significant  hilar or mediastinal adenopathy is noted. Cardiovascular: Pulmonary artery demonstrates a normal branching pattern. No findings to suggest pulmonary emboli are seen. Aortic calcifications are noted without aneurysmal dilatation or dissection. Changes consistent with coronary bypass grafting are noted. Multiple coronary stents are noted within the bypass grafts. Native coronary calcifications are seen. No right heart strain is noted. No pericardial effusion is seen. Lungs/Pleura: Bilateral pleural effusions right greater than left are seen. Bilateral dependent atelectatic changes are noted. No significant pulmonary edema is noted. Upper abdomen: Visualized upper abdomen is within normal limits. Musculoskeletal: Degenerative changes of the thoracic spine are noted. Review of the MIP images confirms the above findings. IMPRESSION: No evidence of pulmonary emboli. Findings of prior coronary bypass grafting with stenting. No acute abnormality noted. Electronically Signed   By: Inez Catalina M.D.   On: 10/19/2015 21:41  Mr Foot Right Wo Contrast  Result Date: 11/02/2015 CLINICAL DATA:  Soft tissue wound on the heel. EXAM: MRI OF THE RIGHT HIND IN FOOT WITHOUT CONTRAST TECHNIQUE: Multiplanar, multisequence MR imaging was performed. No intravenous contrast was administered. COMPARISON:  Radiographs dated 10/31/2015 FINDINGS: There is a superficial soft tissue ulceration on the posterior aspect of the heel approximately 5 mm deep. There is slight edema in the adjacent subcutaneous fat. There is no abscess or osteomyelitis. No joint effusions. The achilles tendon at the tendons at the medial, lateral and anterior aspects of the ankle and hindfoot appear normal. IMPRESSION: Superficial soft tissue ulceration of the heel. No underlying abscess or osteomyelitis. Electronically Signed   By: Lorriane Shire M.D.   On: 11/02/2015 08:13   Mr Foot Left Wo Contrast  Result Date: 11/02/2015 CLINICAL DATA:  Diabetic patient with  bilateral heel wounds. Septic shock. No known injury. Initial encounter. EXAM: MRI  OF THE LEFT FOOT WITHOUT CONTRAST TECHNIQUE: Multiplanar, multisequence MR imaging was performed. No intravenous contrast was administered. COMPARISON:  Plain films left foot 10/31/2015. FINDINGS: A large skin ulceration seen over the dorsal margin of the calcaneus. Marrow edema is seen in the posterior aspect of the calcaneus most notable in the dorsal process and lateral calcaneus. No soft tissue abscess is identified. Mild edema is seen in the distal Achilles tendon eccentric with the medial side. Major ligaments and tendons about the ankle are intact. No joint effusion is identified. A bone small infarct is seen in the lateral talar without fragmentation or collapse. Mild degenerative change at the calcaneocuboid joint is noted. IMPRESSION: Large skin wound over the heel with marrow edema in the posterior calcaneus subjacent to the wound consistent osteomyelitis. Negative for abscess or septic joint. Small bone infarct lateral talus. Electronically Signed   By: Inge Rise M.D.   On: 11/02/2015 08:16   Dg Chest Port 1 View  Result Date: 10/31/2015 CLINICAL DATA:  Hypotension EXAM: PORTABLE CHEST 1 VIEW COMPARISON:  10/19/2015 FINDINGS: Cardiac shadow is enlarged. Postsurgical changes are again seen. The lungs are well aerated bilaterally. Mild interstitial changes are again seen without acute abnormality. IMPRESSION: No acute abnormality noted. Electronically Signed   By: Inez Catalina M.D.   On: 10/31/2015 18:01   Dg Foot Complete Left  Result Date: 10/31/2015 CLINICAL DATA:  Wound of bilateral heels for weeks. EXAM: LEFT FOOT - COMPLETE 3+ VIEW COMPARISON:  03/13/2011 MRI FINDINGS: Status post amputation of the great toe the level of the distal metatarsal. There is significant soft tissue swelling in the region of the metatarsophalangeal joints. There is soft tissue swelling superficial to the calcaneus. Additionally,  foci of air are identified adjacent to the calcaneus consistent with ulceration. IMPRESSION: 1. Significant soft tissue swelling. 2. Soft tissue ulceration superficial to the calcaneus. 3. Status post great toe amputation. Electronically Signed   By: Nolon Nations M.D.   On: 10/31/2015 22:03   Dg Foot Complete Right  Result Date: 10/31/2015 CLINICAL DATA:  Wounds of bilateral heels for weeks. EXAM: RIGHT FOOT COMPLETE - 3+ VIEW COMPARISON:  None. FINDINGS: Surgical clips are identified in the region the ankle. No acute fracture or subluxation. No cortical erosion. Soft tissue swelling superficial to the calcaneus. IMPRESSION: Soft tissue swelling. Electronically Signed   By: Nolon Nations M.D.   On: 10/31/2015 22:01     Assessment/Plan   ICD-9-CM ICD-10-CM   1. S/P AKA (above knee amputation) unilateral, left ([place]) V49.76 Z89.612   2. Peripheral vascular disease (HCC) 443.9 I73.9   3. Diabetic peripheral neuropathy associated with type 2 diabetes mellitus (HCC) 250.60 E11.42    357.2    4. Chronic systolic heart failure (HCC) 428.22 I50.22   5. Essential hypertension 401.9 I10   6. Severe episode of recurrent major depressive disorder, without psychotic features ([place]) 296.33 F33.2   7. Primary osteoarthritis involving multiple joints 715.09 M15.0   8. Long term current use of anticoagulant therapy V58.61 Z79.01   9. Other chronic pulmonary embolism without acute cor pulmonale (HCC)  I27.82   10. Chronic obstructive pulmonary disease, unspecified COPD type ([place]) 496 J44.9      D/c simvastatin but cont lipitor   Cont other meds as ordered  PT/OT/St as ordered  F/u with Ortho as scheduled  F/u with cardiology as scheduled  Cont nutritional supplements as indicated  T/c adding daily B12 supplement due to borderline low B12 level for age  [place] O2 as ordered  GOAL: short term rehab followed by long term care. Communicated with pt and nursing.  Will follow   Donald Memoli S. Perlie Gold  [facility] [address] [place], [place] [address] [phone] Cell (Monday-Friday 8 AM - 5 PM) [phone] After 5 PM and follow prompts

## 2015-11-30 ENCOUNTER — Non-Acute Institutional Stay (SKILLED_NURSING_FACILITY): Payer: Medicare Other | Admitting: Adult Health

## 2015-11-30 ENCOUNTER — Encounter: Payer: Self-pay | Admitting: Vascular Surgery

## 2015-11-30 ENCOUNTER — Encounter: Payer: Self-pay | Admitting: Adult Health

## 2015-11-30 DIAGNOSIS — L03116 Cellulitis of left lower limb: Secondary | ICD-10-CM

## 2015-11-30 NOTE — Progress Notes (Signed)
Patient ID: [name], male   DOB: Sep 03, 1940, 75 y.o.   MRN: [id number]    Location:   [place] Room Number: 159-B Place of Service:  SNF (31)   CODE STATUS: DNR  Allergies  Allergen Reactions  . Diazepam Anxiety    REACTION: makes patient cry  . Vancomycin Other (See Comments)    "red man syndrome"    Chief Complaint  Patient presents with  . Acute Visit    Acute    HPI:  I have been asked to look at his incision like for possible infection. His stump is inflamed with purulent drainage present. He is not complaining of pain; there are no reports of fever present.   Past Medical History:  Diagnosis Date  . Adhesive capsulitis   . Angina   . Anxiety   . CAD (coronary artery disease)   . CHF (congestive heart failure) ([place])   . Chronic back pain   . Chronic kidney disease    hx of BPH  . COPD (chronic obstructive pulmonary disease) ([place])   . CVA (cerebral infarction) Questionable history  . Depression   . Diabetes mellitus   . Diverticulosis   . Falls frequently 06/2014  . GERD (gastroesophageal reflux disease)   . Hyperlipidemia   . Hypertension   . 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 Myocardial infarction ([place])   . Neuromuscular disorder ([place])    HX of diabetic periferal neuropathy  . On home oxygen therapy    "1.5L prn" (04/12/2014)  . Osteomyelitis ([place]) 2012   Sternoclavicular joint   . PE (pulmonary embolism) 10-15 years ago   Lifelong Coumadin  . Peripheral vascular disease ([place])   . Shortness of breath   . Sleep apnea     Past Surgical History:  Procedure Laterality Date  . AMPUTATION  03/16/2011   Procedure: AMPUTATION DIGIT;  Surgeon: Angelia Mould, MD;  Location: [facility] OR;  Service: Vascular;  Laterality: Left;  Great toe  . AMPUTATION Left 11/08/2015   Procedure: AMPUTATION ABOVE KNEE LEFT;  Surgeon: Waynetta Sandy, MD;  Location: [place];  Service: Vascular;   Laterality: Left;  . CARDIAC CATHETERIZATION  October 18, 2010   Known obstructive disease, no further blockages  . CARDIAC CATHETERIZATION  03/17/2005   EF 35-40%  . COLONOSCOPY  2006    Multiple polyps removed, repeat in 5 years  . CORONARY ARTERY BYPASS GRAFT  1992  . [place], 2002  . DOPPLER ECHOCARDIOGRAPHY  Sept 2011   EF 50-55% with some impaired diastolic relaxation  . FEMORAL-POPLITEAL BYPASS GRAFT  2008   Left  . FRACTURE SURGERY  1980s   Hip  . HIP ARTHROPLASTY Left   . PERIPHERAL VASCULAR CATHETERIZATION N/A 11/06/2015   Procedure: Abdominal Aortogram;  Surgeon: Serafina Mitchell, MD;  Location: [place] CV LAB;  Service: Cardiovascular;  Laterality: N/A;  . PERIPHERAL VASCULAR CATHETERIZATION Bilateral 11/06/2015   Procedure: Lower Extremity Angiography;  Surgeon: Serafina Mitchell, MD;  Location: [place] CV LAB;  Service: Cardiovascular;  Laterality: Bilateral;  . Right iliopopliteal bypass - [place]  . septic arthritis     Removal of infected CABG wire by Dr Arlyce Dice - 2011  . SPINE SURGERY  2002   Cervical fusion vertebroplasty C3-4-5  . US ECHOCARDIOGRAPHY  03/04/2006   EF 50-55%    Social History   Social History  .  Marital status: Married    Spouse name: Butch Penny  . Number of children: 3  . Years of education: 12   Occupational History  . Retired-machinest/welder Retired   Social History Main Topics  . Smoking status: Former Smoker    Quit date: 06/19/2000  . Smokeless tobacco: Never Used  . Alcohol use No  . Drug use: No  . Sexual activity: No   Other Topics Concern  . Not on file   Social History Narrative   Health Care POA: TBD   Emergency Contact: wife, Butch Penny [phone]   End of Life Plan: pt does not have AD and not interested.   Who lives with you: Butch Penny and step-daughter-Betty   Any pets: none   Diet: Pt has a varied diet of protein, starch and vegetables.   Exercise: Pt does not have regular exercise routine.    Seatbelts: Pt reports wearing seatbelt when in vehicles.    Hobbies: listening to car races.             Family History  Problem Relation Age of Onset  . Heart disease Father   . Heart disease Mother       VITAL SIGNS BP (!) 114/52   Pulse 86   Temp 97.6 F (36.4 C) (Oral)   Resp 18   Ht 5\' 6"  (QA348G m)   Wt 235 lb (106.6 kg)   SpO2 97%   BMI 37.93 kg/m   Patient's Medications  New Prescriptions   No medications on file  Previous Medications   AMINO ACIDS-PROTEIN HYDROLYS (FEEDING SUPPLEMENT, PRO-STAT SUGAR FREE 64,) LIQD    Take 30 mLs by mouth 2 (two) times daily.   ASPIRIN EC 81 MG EC TABLET    Take 1 tablet (81 mg total) by mouth daily.   ATORVASTATIN (LIPITOR) 20 MG TABLET    Take 20 mg by mouth daily.   BISACODYL (DULCOLAX) 10 MG SUPPOSITORY    Place 1 suppository (10 mg total) rectally daily as needed for moderate constipation.   CARVEDILOL (COREG) 3.125 MG TABLET    Take 1 tablet (3.125 mg total) by mouth 2 (two) times daily with a meal.   COLLAGENASE (SANTYL) OINTMENT    Apply 1 application topically daily. Apply Santyl to left and right heel wounds Q day, then cover with moist gauze and dry kerlex   DOCUSATE SODIUM (COLACE) 100 MG CAPSULE    Take 100 mg by mouth 2 (two) times daily.   DULOXETINE (CYMBALTA) 30 MG CAPSULE    Take 90 mg by mouth daily.   FERROUS SULFATE 325 (65 FE) MG TABLET    Take 1 tablet (325 mg total) by mouth daily with breakfast.   FUROSEMIDE (LASIX) 80 MG TABLET    Take 1 tablet (80 mg total) by mouth 2 (two) times daily.   GABAPENTIN (NEURONTIN) 400 MG CAPSULE    Take 400 mg by mouth 3 (three) times daily.   GUAIFENESIN-DEXTROMETHORPHAN (ROBITUSSIN DM) 100-10 MG/5ML SYRUP    Take 15 mLs by mouth every 4 (four) hours as needed for cough.   HYDROCODONE-ACETAMINOPHEN (NORCO) 7.5-325 MG TABLET    Take 1 tablet by mouth every 4 (four) hours as needed for moderate pain or severe pain.   INSULIN LISPRO (HUMALOG) 100 UNIT/ML CARTRIDGE    Inject into  the skin. Inject as per sliding scale: if  0-70=0; 71-149 = 8 units, 150-600= 13 units subcutaneously before meals related to DM.   INSULIN NPH HUMAN (HUMULIN N,NOVOLIN N) 100 UNIT/ML  INJECTION    Inject 0.2 mLs (20 Units total) into the skin 2 (two) times daily before a meal.   LIDOCAINE (LIDODERM) 5 %    Place 1 patch onto the skin daily.    LORAZEPAM (ATIVAN) 0.5 MG TABLET    Take 1 tablet (0.5 mg total) by mouth 2 (two) times daily as needed for anxiety.   LUBIPROSTONE (AMITIZA) 8 MCG CAPSULE    Take 8 mcg by mouth 2 (two) times daily with a meal.   METHOCARBAMOL (ROBAXIN) 500 MG TABLET    Take 1 tablet (500 mg total) by mouth 3 (three) times daily.   MULTIPLE VITAMINS-MINERALS (DECUBI-VITE PO)    Take 1 tablet by mouth daily.    NITROGLYCERIN (NITROSTAT) 0.4 MG SL TABLET    Place 1 tablet (0.4 mg total) under the tongue as needed. For chest pains   OMEPRAZOLE (PRILOSEC) 40 MG CAPSULE    Take 1 capsule (40 mg total) by mouth daily.   OXYGEN    Inhale 2 L into the lungs as needed (for congestion, cough, and wheezing related to COPD).   PROPYLENE GLYCOL (SYSTANE BALANCE) 0.6 % SOLN    Place 1 drop into both eyes 2 (two) times daily.    RIVAROXABAN (XARELTO) 20 MG TABS TABLET    Take 20 mg by mouth daily with supper.   SENNA-DOCUSATE (SENOKOT-S) 8.6-50 MG TABLET    Take 1 tablet by mouth 2 (two) times daily.   SKIN PROTECTANTS, MISC. (CALAZIME SKIN PROTECTANT EX)    Apply topically. Apply to sacrum and right buttock every day shift   TAMSULOSIN (FLOMAX) 0.4 MG CAPS CAPSULE    Take 1 capsule (0.4 mg total) by mouth daily after supper.   TRAZODONE (DESYREL) 100 MG TABLET    Take 100 mg by mouth at bedtime.    VITAMIN D, ERGOCALCIFEROL, (DRISDOL) 50000 UNITS CAPS CAPSULE    Take 1 capsule (50,000 Units total) by mouth every 7 (seven) days.  Modified Medications   No medications on file  Discontinued Medications   No medications on file     SIGNIFICANT DIAGNOSTIC EXAMS  09-04-14: bilateral hip  and pelvic x-ray: Left hip arthroplasty with mild acetabular protrusio. No fracture or dislocation is seen. No interval change.  09-04-14: right femur x-ray: No fracture or dislocation is seen.   09-04-14: left femur x-ray: No acute abnormality.  09-13-14: left hip and pelvic x-ray: No acute bony abnormality.   09-13-14: right shoulder x-ray: Advanced degenerative change in the shoulder joint. Negative for Fracture.  12-22-14: right hand and right wrist x-ray: no acute process; right hand osteoarthritis present.   07-26-15: chest x-ray: bilateral lower lobe atelectasis.   07-30-15: chest x-ray: no acute cardiopulmonary pathology     LABS REVIEWED:   12-25-14: inr 3.03  02-23-15: tsh 4.032; vit d 11  03-08-15: wbc 7.7; hgb 11.5; hct 35.7; mcv 79.7; plt 156; glucose 115; bun 19; creat 0.96; k+ 4.6; na++146; liver normal albumin 3.4 INR 1.66  04-04-15: glucose 136; bun 19; creat 1.02; k+ 3.7; na++142 06-04-15: INR 2.77  06-18-15: INR 2.41  Urine for micro-albumin 0.7  06-25-15: wbc 8.0; hgb 12.1; hct 37.9 ;mcv 78.6; plt 159; glucose 122; bun 20; creat 0.93; k+ 4.3; na++142 liver normal albumin 3.6; chol 151; ldl 87; trig 162; hdl 32; hgb a1c 7.4 07-26-15: wbc 7.7; hgb 12.;3 hct 37.2; mcv 77.3; plt 106; glucose 161; bun 37; creat 1.75; k+ 4.3; na++136  07-30-15: wbc 12.8; hgb 10.7; hct 3.2; mcv  77.4; plt 174; g glucose 120; bun 43; creat 1.22; k+ 4.3; na++134 inr >10 (question reading of inr)      Review of Systems Constitutional: Negative for malaise/fatigue.  Respiratory: no cough or shortness of breath  Cardiovascular: Negative for chest pain, palpitations and leg swelling.  Gastrointestinal: Negative for heartburn, abdominal pain and constipation.  Musculoskeletal: chronic back pain Skin: drainage on incision line    Neurological: Negative for headaches.  Psychiatric/Behavioral: . The patient is not nervous/anxious    Physical Exam Constitutional: He is oriented to person, place, and time.  No distress.  Obese   Neck: Neck supple. No JVD present. No thyromegaly present.  Cardiovascular: Normal rate, regular rhythm and intact distal pulses.   Respiratory: Effort normal and breath sounds normal. No respiratory distress.  GI: Soft. Bowel sounds are normal. He exhibits no distension. There is no tenderness.  Musculoskeletal:  Is status post left aka  Is able to move all extremities 1+ right 2lower extremity edema   Neurological: He is alert and oriented to person, place, and time.  Skin: Skin is warm and dry. He is not diaphoretic.  left stump incision line is red; inflamed; with purulent drainage present.  Psychiatric: He has a normal mood and affect.    ASSESSMENT/ PLAN:  1. Left stump cellulitis: will begin doxycycline 100 mg twice daily for 3 weeks with florastor twice daily and will culture the drainage. Will monitor    MD is aware of resident's narcotic use and is in agreement with current plan of care. We will attempt to wean resident as appropriate.    Ok Edwards NP [facility]  Contact [phone] Monday through Friday 8am- 5pm  After hours call [phone]

## 2015-12-07 ENCOUNTER — Encounter: Payer: Self-pay | Admitting: Vascular Surgery

## 2015-12-07 ENCOUNTER — Ambulatory Visit (INDEPENDENT_AMBULATORY_CARE_PROVIDER_SITE_OTHER): Payer: Medicare Other | Admitting: Vascular Surgery

## 2015-12-07 VITALS — BP 108/65 | HR 106 | Temp 98.2°F | Ht 66.0 in | Wt 220.0 lb

## 2015-12-07 DIAGNOSIS — I779 Disorder of arteries and arterioles, unspecified: Secondary | ICD-10-CM

## 2015-12-07 NOTE — Progress Notes (Signed)
Subjective:     Patient ID: [name], male   DOB: 1940-07-12, 75 y.o.   MRN: [id number]  HPI 75 year old male returns for follow-up from recent hospitalization where he underwent bilateral lower extremity angiogram and ultimately left above-the-knee amputation for wet gangrene of left foot causing sepsis. He now returns with breakdown of the left above-the-knee of dictation site in the middle. He is staying at [facility] denies fevers continues to eat no systemic symptoms.   Review of Systems  Constitutional: Negative.   Respiratory: Negative.   Cardiovascular: Negative.   Gastrointestinal: Negative.   Musculoskeletal: Negative.   Neurological: Negative.        Objective:   Physical Exam  Constitutional: He is oriented to person, place, and time. He appears well-developed.  Eyes: EOM are normal.  Cardiovascular: Normal rate.   Pulmonary/Chest: Effort normal.  Abdominal: Soft.  Neurological: He is alert and oriented to person, place, and time.  Skin:  Breakdown of middle on left aka site, staples removed and local wound care performed with packing       Assessment:    this returns for follow-up of his recent left above-the-knee amputation. He has breakdown of his wound and staples at that site were removed today. He is not having fevers or systemic symptoms and there is no surrounding cellulitis. This appears to be fat necrosis without need for debridement.    Plan:  He will need twice a day wet-to-dry dressings of this site. I'll have him follow up in 2 weeks' time with the rest of the staples can be removed and we can reevaluate the wound at that time. Should he have issues before then he'll be sent back for earlier appointment.  Juliane Guest C. Donzetta Matters, MD [facility] Office: [phone] Pager: [phone]

## 2015-12-14 ENCOUNTER — Encounter: Payer: Self-pay | Admitting: Vascular Surgery

## 2015-12-18 ENCOUNTER — Non-Acute Institutional Stay (SKILLED_NURSING_FACILITY): Payer: Medicare Other | Admitting: Internal Medicine

## 2015-12-18 ENCOUNTER — Encounter: Payer: Self-pay | Admitting: Internal Medicine

## 2015-12-18 DIAGNOSIS — I10 Essential (primary) hypertension: Secondary | ICD-10-CM | POA: Diagnosis not present

## 2015-12-18 DIAGNOSIS — E1142 Type 2 diabetes mellitus with diabetic polyneuropathy: Secondary | ICD-10-CM | POA: Diagnosis not present

## 2015-12-18 DIAGNOSIS — I5022 Chronic systolic (congestive) heart failure: Secondary | ICD-10-CM | POA: Diagnosis not present

## 2015-12-18 DIAGNOSIS — T8131XA Disruption of external operation (surgical) wound, not elsewhere classified, initial encounter: Secondary | ICD-10-CM

## 2015-12-18 DIAGNOSIS — F332 Major depressive disorder, recurrent severe without psychotic features: Secondary | ICD-10-CM

## 2015-12-18 DIAGNOSIS — R51 Headache: Secondary | ICD-10-CM | POA: Diagnosis not present

## 2015-12-18 DIAGNOSIS — I739 Peripheral vascular disease, unspecified: Secondary | ICD-10-CM

## 2015-12-18 DIAGNOSIS — R519 Headache, unspecified: Secondary | ICD-10-CM

## 2015-12-18 DIAGNOSIS — Z89612 Acquired absence of left leg above knee: Secondary | ICD-10-CM | POA: Diagnosis not present

## 2015-12-18 NOTE — Progress Notes (Signed)
Patient ID: [name], male   DOB: 07-24-1940, 75 y.o.   MRN: [id number]    DATE:  12/18/2015  Location:    [facility] Room Number: 159 B Place of Service: SNF (31)   Extended Emergency Contact Information Primary Emergency Contact: Lianne Moris Address: [place], [place] Mobile Phone: [phone] Relation: Spouse Secondary Emergency Contact: Stedron,Cathy  [place] Mobile Phone: [phone] Relation: Daughter Preferred language: English  Advanced Directive information Does patient have an advance directive?: Yes, Type of Advance Directive: Out of facility DNR (pink MOST or yellow form), Does patient want to make changes to advanced directive?: No - Patient declined  Chief Complaint  Patient presents with  . Medical Management of Chronic Issues    Routine Visit    HPI:  75 yo male long term resident seen today for f/u. He c/o frontal HA but no sinus pressure. No rhinorrhea. No sore throat. No f/c. No other concerns. No nursing issues. No falls. He was tx for left AKA stump cellulitis with doxycycline. He has completed tx.   Chronic systolic heart failure - stable on demadex  20 mg twice daily; ACEI stopped due to AKI. Low dose carvedilol 3.125mg  BID started  Hypertension - BP stable on low dose coreg, torsemide.  DM - no low BS reactions. A1c 7.4%. He takes novolog 8 units prior to meals with an additional 5 units  for cbg >=150; NPH  22 units twice daily with meals. He has neuropathy and takes neurontin 400 mg TID and cymbalta 90 mg daily. Off ACEI due to AKI  CAD - he is s/p CABG and stents.  no complaints of chest pain present; takes  Carvedilol, ASA and xeralto. has not req'd prn ntg  GERD - stable on prilosec 40 mg daily   Hx CVA - stable. Takes xeralto and ASA  Hx PE - coumadin changed to xeralto earlier this month due to difficulty with INR stability. No bleeding   Venous  stasis dermatitis/LE ulcer/PAD - followed by vascular sx. Facility wound care following. He is s/p left AKA and he has an open wound. Followed by Ortho  Hyperlipidemia - stable on statin. LDL 87  BPH - sx's stable on flomax 0.4 mg daily   Osteoarthritis/chronic pain -  Uses lidoderm patch as directed to shoulder; norco 7.5/325 mg prior to therapy and at night with tid prn; He is taking robaxin for muscle spasms  MDD/Anxiety/insomnia - mood stable on trazodone 100 mg nightly; takes ativan 0.5 mg nightly; melatonin 3 mg nightly for sleep; cymbalta 90 mg daily  Vit D deficiency - stable on vit d 50,000 units weekly   Constipation - due to long term narcotic use. Takes amitiza  41mcg BID and colace BID  Anemia - Hgb 8.9. He takes iron supplement and MVI daily. B12 level 247     Past Medical History:  Diagnosis Date  . Adhesive capsulitis   . Angina   . Anxiety   . CAD (coronary artery disease)   . CHF (congestive heart failure) ([place])   . Chronic back pain   . Chronic kidney disease    hx of BPH  . COPD (chronic obstructive pulmonary disease) ([place])   . CVA (cerebral infarction) Questionable history  . Depression   . Diabetes mellitus   . Diverticulosis   . Falls frequently 06/2014  . GERD (gastroesophageal reflux disease)   .  Hyperlipidemia   . Hypertension   . 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 Myocardial infarction ([place])   . Neuromuscular disorder ([place])    HX of diabetic periferal neuropathy  . On home oxygen therapy    "1.5L prn" (04/12/2014)  . Osteomyelitis ([place]) 2012   Sternoclavicular joint   . PE (pulmonary embolism) 10-15 years ago   Lifelong Coumadin  . Peripheral vascular disease ([place])   . Shortness of breath   . Sleep apnea     Past Surgical History:  Procedure Laterality Date  . AMPUTATION  03/16/2011   Procedure: AMPUTATION DIGIT;  Surgeon: Angelia Mould, MD;  Location: [facility] OR;  Service: Vascular;   Laterality: Left;  Great toe  . AMPUTATION Left 11/08/2015   Procedure: AMPUTATION ABOVE KNEE LEFT;  Surgeon: Waynetta Sandy, MD;  Location: [facility];  Service: Vascular;  Laterality: Left;  . CARDIAC CATHETERIZATION  October 18, 2010   Known obstructive disease, no further blockages  . CARDIAC CATHETERIZATION  03/17/2005   EF 35-40%  . COLONOSCOPY  2006    Multiple polyps removed, repeat in 5 years  . CORONARY ARTERY BYPASS GRAFT  1992  . [place], 2002  . DOPPLER ECHOCARDIOGRAPHY  Sept 2011   EF 50-55% with some impaired diastolic relaxation  . FEMORAL-POPLITEAL BYPASS GRAFT  2008   Left  . FRACTURE SURGERY  1980s   Hip  . HIP ARTHROPLASTY Left   . PERIPHERAL VASCULAR CATHETERIZATION N/A 11/06/2015   Procedure: Abdominal Aortogram;  Surgeon: Serafina Mitchell, MD;  Location: [facility];  Service: Cardiovascular;  Laterality: N/A;  . PERIPHERAL VASCULAR CATHETERIZATION Bilateral 11/06/2015   Procedure: Lower Extremity Angiography;  Surgeon: Serafina Mitchell, MD;  Location: [facility];  Service: Cardiovascular;  Laterality: Bilateral;  . Right iliopopliteal bypass - [place]  . septic arthritis     Removal of infected CABG wire by Dr Arlyce Dice - 2011  . SPINE SURGERY  2002   Cervical fusion vertebroplasty C3-4-5  . US ECHOCARDIOGRAPHY  03/04/2006   EF 50-55%    Patient Care Team: Alveda Reasons, MD as PCP - General Darlin Coco, MD (Cardiology) Clent Jacks, MD (Ophthalmology) Christin Fudge, MD as Consulting Physician (Surgery)  Social History   Social History  . Marital status: Married    Spouse name: Butch Penny  . Number of children: 3  . Years of education: 12   Occupational History  . Retired-machinest/welder Retired   Social History Main Topics  . Smoking status: Former Smoker    Quit date: 06/19/2000  . Smokeless tobacco: Never Used  . Alcohol use No  . Drug use: No  . Sexual activity: No   Other Topics Concern  . Not on  file   Social History Narrative   Health Care POA: TBD   Emergency Contact: wife, Butch Penny [phone]   End of Life Plan: pt does not have AD and not interested.   Who lives with you: Butch Penny and step-daughter-Betty   Any pets: none   Diet: Pt has a varied diet of protein, starch and vegetables.   Exercise: Pt does not have regular exercise routine.   Seatbelts: Pt reports wearing seatbelt when in vehicles.    Hobbies: listening to car races.               reports that he quit smoking about 15 years ago. He has never used  smokeless tobacco. He reports that he does not drink alcohol or use drugs.  Family History  Problem Relation Age of Onset  . Heart disease Father   . Heart disease Mother    Family Status  Relation Status  . Father Deceased  . Mother Deceased    Immunization History  Administered Date(s) Administered  . Influenza Split 12/12/2010  . Influenza Whole 12/22/2005, 12/14/2007  . Influenza,inj,Quad PF,36+ Mos 01/20/2013, 12/09/2013  . Influenza-Unspecified 12/15/2014  . Pneumococcal Conjugate-13 10/10/2013  . Pneumococcal Polysaccharide-23 11/23/1999, 03/25/2003, 05/25/2011  . Td 10/23/2003  . Tdap 07/17/2014    Allergies  Allergen Reactions  . Diazepam Anxiety    REACTION: makes patient cry  . Vancomycin Other (See Comments)    "red man syndrome"    Medications: Patient's Medications  New Prescriptions   No medications on file  Previous Medications   AMINO ACIDS-PROTEIN HYDROLYS (FEEDING SUPPLEMENT, PRO-STAT SUGAR FREE 64,) LIQD    Take 30 mLs by mouth 2 (two) times daily.   ASPIRIN EC 81 MG EC TABLET    Take 1 tablet (81 mg total) by mouth daily.   ATORVASTATIN (LIPITOR) 20 MG TABLET    Take 20 mg by mouth daily.   BISACODYL (DULCOLAX) 10 MG SUPPOSITORY    Place 1 suppository (10 mg total) rectally daily as needed for moderate constipation.   CARVEDILOL (COREG) 3.125 MG TABLET    Take 1 tablet (3.125 mg total) by mouth 2 (two) times daily with a meal.    COLLAGENASE (SANTYL) OINTMENT    Apply 1 application topically daily. Apply Santyl to left and right heel wounds Q day, then cover with moist gauze and dry kerlex   DOCUSATE SODIUM (COLACE) 100 MG CAPSULE    Take 100 mg by mouth 2 (two) times daily.   DULOXETINE (CYMBALTA) 30 MG CAPSULE    Take 90 mg by mouth daily.   FERROUS SULFATE 325 (65 FE) MG TABLET    Take 1 tablet (325 mg total) by mouth daily with breakfast.   FUROSEMIDE (LASIX) 80 MG TABLET    Take 1 tablet (80 mg total) by mouth 2 (two) times daily.   GABAPENTIN (NEURONTIN) 400 MG CAPSULE    Take 400 mg by mouth 3 (three) times daily.   GUAIFENESIN-DEXTROMETHORPHAN (ROBITUSSIN DM) 100-10 MG/5ML SYRUP    Take 15 mLs by mouth every 4 (four) hours as needed for cough.   HYDROCODONE-ACETAMINOPHEN (NORCO) 7.5-325 MG TABLET    Take 1 tablet by mouth every 4 (four) hours as needed for moderate pain or severe pain.   INSULIN LISPRO (HUMALOG) 100 UNIT/ML CARTRIDGE    Inject into the skin. Inject as per sliding scale: if  0-70=0; 71-149 = 8 units, 150-600= 13 units subcutaneously before meals related to DM.   INSULIN NPH HUMAN (HUMULIN N,NOVOLIN N) 100 UNIT/ML INJECTION    Inject 0.2 mLs (20 Units total) into the skin 2 (two) times daily before a meal.   LIDOCAINE (LIDODERM) 5 %    Place 1 patch onto the skin daily.    LORAZEPAM (ATIVAN) 0.5 MG TABLET    Take 1 tablet (0.5 mg total) by mouth 2 (two) times daily as needed for anxiety.   LUBIPROSTONE (AMITIZA) 8 MCG CAPSULE    Take 8 mcg by mouth 2 (two) times daily with a meal.   METHOCARBAMOL (ROBAXIN) 500 MG TABLET    Take 1 tablet (500 mg total) by mouth 3 (three) times daily.   MULTIPLE VITAMINS-MINERALS (DECUBI-VITE PO)  Take 1 tablet by mouth daily.    NITROGLYCERIN (NITROSTAT) 0.4 MG SL TABLET    Place 1 tablet (0.4 mg total) under the tongue as needed. For chest pains   OMEPRAZOLE (PRILOSEC) 40 MG CAPSULE    Take 1 capsule (40 mg total) by mouth daily.   OXYGEN    Inhale 2 L into the  lungs as needed (for congestion, cough, and wheezing related to COPD).   PROPYLENE GLYCOL (SYSTANE BALANCE) 0.6 % SOLN    Place 1 drop into both eyes 2 (two) times daily.    RIVAROXABAN (XARELTO) 20 MG TABS TABLET    Take 20 mg by mouth daily with supper.   SACCHAROMYCES BOULARDII (FLORASTOR) 250 MG CAPSULE    Take 250 mg by mouth 2 (two) times daily.   SENNA-DOCUSATE (SENOKOT-S) 8.6-50 MG TABLET    Take 1 tablet by mouth 2 (two) times daily.   SKIN PROTECTANTS, MISC. (CALAZIME SKIN PROTECTANT EX)    Apply topically. Apply to sacrum and right buttock every day shift   TAMSULOSIN (FLOMAX) 0.4 MG CAPS CAPSULE    Take 1 capsule (0.4 mg total) by mouth daily after supper.   TRAZODONE (DESYREL) 100 MG TABLET    Take 100 mg by mouth at bedtime.    VITAMIN D, ERGOCALCIFEROL, (DRISDOL) 50000 UNITS CAPS CAPSULE    Take 1 capsule (50,000 Units total) by mouth every 7 (seven) days.  Modified Medications   No medications on file  Discontinued Medications   No medications on file    Review of Systems  Musculoskeletal: Positive for arthralgias and gait problem.  Neurological: Positive for numbness and headaches.  All other systems reviewed and are negative.   Vitals:   12/18/15 1104  BP: 110/62  Pulse: 90  Resp: 20  Temp: 98.6 F (37 C)  TempSrc: Oral  SpO2: 98%  Weight: 235 lb 4.8 oz (106.7 kg)   Body mass index is 37.98 kg/m.  Physical Exam  Constitutional: He appears well-developed.  Lying in bed in NAD, frail appearing  HENT:  Mouth/Throat: Oropharynx is clear and moist.  No sinus TTP  Eyes: Pupils are equal, round, and reactive to light. No scleral icterus.  Neck: Neck supple. Carotid bruit is not present. No thyromegaly present.  Cardiovascular: Normal rate, regular rhythm and intact distal pulses.  Exam reveals no gallop and no friction rub.   Murmur (1/6 SEM) heard. Trace RLE edema with chronic venous stasis changes. Right foot dsg intact.  No calf TTP. Left AKA noted    Pulmonary/Chest: Effort normal. He has no wheezes. He has no rales. He exhibits no tenderness.  Reduced BS at base b/l.   Abdominal: Soft. Bowel sounds are normal. He exhibits no distension, no abdominal bruit, no pulsatile midline mass and no mass. There is no tenderness. There is no rebound and no guarding.  Musculoskeletal: He exhibits edema and tenderness.  Left AKA stump with swelling; wound dehiscence noted and open area is packed; most staples intact; dsg is dirty  Lymphadenopathy:    He has no cervical adenopathy.  Neurological: He is alert.  Skin: Skin is warm and dry. No rash noted.  Chronic venous stasis changes RLE  Psychiatric: He has a normal mood and affect. His behavior is normal.     Labs reviewed: Nursing Home on 11/13/2015  Component Date Value Ref Range Status  . Triglycerides 09/21/2015 100  40 - 160 mg/dL Final  . Cholesterol 09/21/2015 104  0 - 200 mg/dL Final  .  HDL 09/21/2015 28* 35 - 70 mg/dL Final  . LDL Cholesterol 09/21/2015 56  mg/dL Final  Admission on 10/31/2015, Discharged on 11/10/2015  No results displayed because visit has over 200 results.    Admission on 10/19/2015, Discharged on 10/29/2015  No results displayed because visit has over 200 results.    Hospital Outpatient Visit on 10/19/2015  Component Date Value Ref Range Status  . Right CCA prox sys 10/19/2015 91  cm/s Final  . Right CCA prox dias 10/19/2015 13  cm/s Final  . Right cca dist sys 10/19/2015 -86  cm/s Final  . Left CCA prox sys 10/19/2015 83  cm/s Final  . Left CCA prox dias 10/19/2015 19  cm/s Final  . Left CCA dist sys 10/19/2015 68  cm/s Final  . Left CCA dist dias 10/19/2015 17  cm/s Final  . Left ICA prox sys 10/19/2015 47  cm/s Final  . Left ICA prox dias 10/19/2015 11  cm/s Final  . Left ICA dist sys 10/19/2015 -64  cm/s Final  . Left ICA dist dias 10/19/2015 -26  cm/s Final  . RIGHT CCA MID DIAS 10/19/2015 19.00  cm/s Final  . RIGHT ECA DIAS 10/19/2015 1.00  cm/s  Final  . LEFT ECA DIAS 10/19/2015 9.00  cm/s Final  Nursing Home on 09/20/2015  Component Date Value Ref Range Status  . INR 08/13/2015 1.7* 0.9 - 1.1 Final  . Protime 08/13/2015 19.9* 10.0 - 13.8 seconds Final  . INR 08/01/2015 3.5* 0.9 - 1.1 Final  . Hemoglobin 07/26/2015 12.3* 13.5 - 17.5 g/dL Final  . HCT 07/26/2015 37* 41 - 53 % Final  . Platelets 07/26/2015 106* 150 - 399 K/L Final  . WBC 07/26/2015 7.7  10^3/mL Final  . Protime 08/01/2015 35.5* 10.0 - 13.8 seconds Final  . Glucose 07/26/2015 161  mg/dL Final  . BUN 07/26/2015 37* 4 - 21 mg/dL Final  . Creatinine 07/26/2015 1.6* 0.6 - 1.3 mg/dL Final  . Potassium 07/26/2015 4.3  3.4 - 5.3 mmol/L Final  . Sodium 07/26/2015 136* 137 - 147 mmol/L Final    No results found.   Assessment/Plan   ICD-9-CM ICD-10-CM   1. Sinus headache 784.0 R51   2. S/P AKA (above knee amputation) unilateral, left ([place]) V49.76 Z89.612   3. Wound dehiscence, initial encounter 998.32 T81.31XA    left AKA  4. Peripheral vascular disease (HCC) 443.9 I73.9   5. Diabetic peripheral neuropathy associated with type 2 diabetes mellitus (HCC) 250.60 E11.42    357.2    6. Essential hypertension 401.9 I10   7. Severe episode of recurrent major depressive disorder, without psychotic features ([place]) 296.33 F33.2   8. Chronic systolic heart failure (HCC) 428.22 I50.22     Start claritin 5mg  po daily  Check CMP and A1c  F/u with Ortho as scheduled later this week  Wound care as ordered. Change dressing daily  Cont other meds as ordered  PT/OT/St as ordered  Fall precautions  Will follow  Maneh Sieben S. Perlie Gold  [facility] [address] [place], [place] [address] [phone] Cell (Monday-Friday 8 AM - 5 PM) [phone] After 5 PM and follow prompts

## 2015-12-21 ENCOUNTER — Ambulatory Visit (INDEPENDENT_AMBULATORY_CARE_PROVIDER_SITE_OTHER): Payer: Medicare Other | Admitting: Vascular Surgery

## 2015-12-21 ENCOUNTER — Encounter: Payer: Self-pay | Admitting: Vascular Surgery

## 2015-12-21 VITALS — BP 100/63 | HR 101 | Temp 98.2°F | Resp 18 | Ht 68.0 in | Wt 236.0 lb

## 2015-12-21 DIAGNOSIS — I779 Disorder of arteries and arterioles, unspecified: Secondary | ICD-10-CM

## 2015-12-21 NOTE — Progress Notes (Signed)
Subjective:     Patient ID: [name], male   DOB: February 27, 1941, 75 y.o.   MRN: [id number]  HPI [name] follows up from left above-knee amputation. He is currently at [facility] and has been having wound care to his left above-knee amputation site were several staples were removed at his last visit. He denies any fever. He is otherwise progressing well.   Review of Systems  Constitutional: Negative.   Respiratory: Negative.   Cardiovascular: Negative.   Gastrointestinal: Negative.   Neurological: Negative.        Objective:   Physical Exam  Constitutional: He appears well-developed.  Pulmonary/Chest: Effort normal.  Musculoskeletal:  L AKA site with 4x6cm defect in center that has clean base and no drainage All staples removed with bleeding noted L lateral aspect with fibrinous exudate  1cm R lateral heel ulcer without erythema       Assessment:      75 year old white male history left above-knee amputation with wound breakdown with clean base. Also has fibrinous exudate of left lateral aspect of the wound. Stable right heel ulcer with non-reconstructable disease in the right lower extremity. Plan:  At this time his wound is amenable to wound VAC. If this is unable to move be performed at [facility] twice a day wet to dries are also sufficient. He can continue his anticoagulation. Does not need antibiotics from an amputation standpoint. He can follow up in 1 month for repeat wound check.  Todd Wescoat C. Donzetta Matters, MD [facility] Office: [phone] Pager: [phone]

## 2015-12-24 ENCOUNTER — Non-Acute Institutional Stay (SKILLED_NURSING_FACILITY): Payer: Medicare Other | Admitting: Adult Health

## 2015-12-24 ENCOUNTER — Encounter: Payer: Self-pay | Admitting: Adult Health

## 2015-12-24 DIAGNOSIS — M25512 Pain in left shoulder: Secondary | ICD-10-CM | POA: Diagnosis not present

## 2015-12-24 DIAGNOSIS — T402X5A Adverse effect of other opioids, initial encounter: Secondary | ICD-10-CM

## 2015-12-24 DIAGNOSIS — K5903 Drug induced constipation: Secondary | ICD-10-CM

## 2015-12-24 DIAGNOSIS — G8929 Other chronic pain: Secondary | ICD-10-CM | POA: Diagnosis not present

## 2015-12-24 DIAGNOSIS — M25511 Pain in right shoulder: Secondary | ICD-10-CM | POA: Diagnosis not present

## 2015-12-24 NOTE — Progress Notes (Signed)
Patient ID: [name], male   DOB: 09-May-1940, 75 y.o.   MRN: [id number]   Location:   [place] Room Number: 159 Place of Service:  SNF (31)   CODE STATUS: DNR  Allergies  Allergen Reactions  . Diazepam Anxiety    REACTION: makes patient cry  . Vancomycin Other (See Comments)    "red man syndrome"    Chief Complaint  Patient presents with  . Acute Visit    Pain    HPI:  He is complaining of bilateral shoulder pain and rectal pain. He states that his shoulders hurt all the time. He presently not getting relief with his pain at this time. He is having rectal pain when he has a bowel movement. His stools are hard.   Past Medical History:  Diagnosis Date  . Adhesive capsulitis   . Angina   . Anxiety   . CAD (coronary artery disease)   . CHF (congestive heart failure) ([place])   . Chronic back pain   . Chronic kidney disease    hx of BPH  . COPD (chronic obstructive pulmonary disease) ([place])   . CVA (cerebral infarction) Questionable history  . Depression   . Diabetes mellitus   . Diverticulosis   . Falls frequently 06/2014  . GERD (gastroesophageal reflux disease)   . Hyperlipidemia   . Hypertension   . 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 Myocardial infarction   . Neuromuscular disorder ([place])    HX of diabetic periferal neuropathy  . On home oxygen therapy    "1.5L prn" (04/12/2014)  . Osteomyelitis ([place]) 2012   Sternoclavicular joint   . PE (pulmonary embolism) 10-15 years ago   Lifelong Coumadin  . Peripheral vascular disease ([place])   . Shortness of breath   . Sleep apnea     Past Surgical History:  Procedure Laterality Date  . AMPUTATION  03/16/2011   Procedure: AMPUTATION DIGIT;  Surgeon: Angelia Mould, MD;  Location: [facility] OR;  Service: Vascular;  Laterality: Left;  Great toe  . AMPUTATION Left 11/08/2015   Procedure: AMPUTATION ABOVE KNEE LEFT;  Surgeon: Waynetta Sandy, MD;   Location: [place];  Service: Vascular;  Laterality: Left;  . CARDIAC CATHETERIZATION  October 18, 2010   Known obstructive disease, no further blockages  . CARDIAC CATHETERIZATION  03/17/2005   EF 35-40%  . COLONOSCOPY  2006    Multiple polyps removed, repeat in 5 years  . CORONARY ARTERY BYPASS GRAFT  1992  . [place], 2002  . DOPPLER ECHOCARDIOGRAPHY  Sept 2011   EF 50-55% with some impaired diastolic relaxation  . FEMORAL-POPLITEAL BYPASS GRAFT  2008   Left  . FRACTURE SURGERY  1980s   Hip  . HIP ARTHROPLASTY Left   . PERIPHERAL VASCULAR CATHETERIZATION N/A 11/06/2015   Procedure: Abdominal Aortogram;  Surgeon: Serafina Mitchell, MD;  Location: [place] CV LAB;  Service: Cardiovascular;  Laterality: N/A;  . PERIPHERAL VASCULAR CATHETERIZATION Bilateral 11/06/2015   Procedure: Lower Extremity Angiography;  Surgeon: Serafina Mitchell, MD;  Location: [place] [facility];  Service: Cardiovascular;  Laterality: Bilateral;  . Right iliopopliteal bypass - [place]  . septic arthritis     Removal of infected CABG wire by Dr Arlyce Dice - 2011  . SPINE SURGERY  2002   Cervical fusion vertebroplasty C3-4-5  . US ECHOCARDIOGRAPHY  03/04/2006   EF 50-55%  Social History   Social History  . Marital status: Married    Spouse name: Butch Penny  . Number of children: 3  . Years of education: 12   Occupational History  . Retired-machinest/welder Retired   Social History Main Topics  . Smoking status: Former Smoker    Quit date: 06/19/2000  . Smokeless tobacco: Never Used  . Alcohol use No  . Drug use: No  . Sexual activity: No   Other Topics Concern  . Not on file   Social History Narrative   Health Care POA: TBD   Emergency Contact: wife, Butch Penny [phone]   End of Life Plan: pt does not have AD and not interested.   Who lives with you: Butch Penny and step-daughter-Betty   Any pets: none   Diet: Pt has a varied diet of protein, starch and vegetables.   Exercise: Pt does not  have regular exercise routine.   Seatbelts: Pt reports wearing seatbelt when in vehicles.    Hobbies: listening to car races.             Family History  Problem Relation Age of Onset  . Heart disease Father   . Heart disease Mother       VITAL SIGNS BP 132/74   Pulse 78   Temp 97.4 F (36.3 C) (Oral)   Resp 18   Ht 5\' 6"  (1.676 m)   Wt 235 lb 3 oz (106.7 kg)   SpO2 97%   BMI 37.96 kg/m   Patient's Medications  New Prescriptions   No medications on file  Previous Medications   AMINO ACIDS-PROTEIN HYDROLYS (FEEDING SUPPLEMENT, PRO-STAT SUGAR FREE 64,) LIQD    Take 30 mLs by mouth 2 (two) times daily.   ASPIRIN EC 81 MG EC TABLET    Take 1 tablet (81 mg total) by mouth daily.   ATORVASTATIN (LIPITOR) 20 MG TABLET    Take 20 mg by mouth daily.   BISACODYL (DULCOLAX) 10 MG SUPPOSITORY    Place 1 suppository (10 mg total) rectally daily as needed for moderate constipation.   CARVEDILOL (COREG) 3.125 MG TABLET    Take 1 tablet (3.125 mg total) by mouth 2 (two) times daily with a meal.   COLLAGENASE (SANTYL) OINTMENT    Apply 1 application topically daily. Apply Santyl to left and right heel wounds Q day, then cover with moist gauze and dry kerlex   DOCUSATE SODIUM (COLACE) 100 MG CAPSULE    Take 100 mg by mouth 2 (two) times daily.   DULOXETINE (CYMBALTA) 30 MG CAPSULE    Take 90 mg by mouth daily.   FERROUS SULFATE 325 (65 FE) MG TABLET    Take 1 tablet (325 mg total) by mouth daily with breakfast.   FUROSEMIDE (LASIX) 80 MG TABLET    Take 1 tablet (80 mg total) by mouth 2 (two) times daily.   GABAPENTIN (NEURONTIN) 400 MG CAPSULE    Take 400 mg by mouth 3 (three) times daily.   GUAIFENESIN-DEXTROMETHORPHAN (ROBITUSSIN DM) 100-10 MG/5ML SYRUP    Take 15 mLs by mouth every 4 (four) hours as needed for cough.   HYDROCODONE-ACETAMINOPHEN (NORCO) 7.5-325 MG TABLET    Take 1 tablet by mouth every 4 (four) hours as needed for moderate pain or severe pain.   INSULIN LISPRO (HUMALOG)  100 UNIT/ML CARTRIDGE    Inject into the skin. Inject as per sliding scale: if  0-70=0; 71-149 = 8 units, 150-600= 13 units subcutaneously before meals related to DM.  INSULIN NPH HUMAN (HUMULIN N,NOVOLIN N) 100 UNIT/ML INJECTION    Inject 0.2 mLs (20 Units total) into the skin 2 (two) times daily before a meal.   LIDOCAINE (LIDODERM) 5 %    Place 1 patch onto the skin daily.    LORAZEPAM (ATIVAN) 0.5 MG TABLET    Take 1 tablet (0.5 mg total) by mouth 2 (two) times daily as needed for anxiety.   LUBIPROSTONE (AMITIZA) 8 MCG CAPSULE    Take 8 mcg by mouth 2 (two) times daily with a meal.   METHOCARBAMOL (ROBAXIN) 500 MG TABLET    Take 1 tablet (500 mg total) by mouth 3 (three) times daily.   MULTIPLE VITAMINS-MINERALS (DECUBI-VITE PO)    Take 1 tablet by mouth daily.    NITROGLYCERIN (NITROSTAT) 0.4 MG SL TABLET    Place 1 tablet (0.4 mg total) under the tongue as needed. For chest pains   OMEPRAZOLE (PRILOSEC) 40 MG CAPSULE    Take 1 capsule (40 mg total) by mouth daily.   OXYGEN    Inhale 2 L into the lungs as needed (for congestion, cough, and wheezing related to COPD).   PROPYLENE GLYCOL (SYSTANE BALANCE) 0.6 % SOLN    Place 1 drop into both eyes 2 (two) times daily.    RIVAROXABAN (XARELTO) 20 MG TABS TABLET    Take 20 mg by mouth daily with supper.   SACCHAROMYCES BOULARDII (FLORASTOR) 250 MG CAPSULE    Take 250 mg by mouth 2 (two) times daily.   SENNA-DOCUSATE (SENOKOT-S) 8.6-50 MG TABLET    Take 1 tablet by mouth 2 (two) times daily.   SKIN PROTECTANTS, MISC. (CALAZIME SKIN PROTECTANT EX)    Apply topically. Apply to sacrum and right buttock every day shift   TAMSULOSIN (FLOMAX) 0.4 MG CAPS CAPSULE    Take 1 capsule (0.4 mg total) by mouth daily after supper.   TRAZODONE (DESYREL) 100 MG TABLET    Take 100 mg by mouth at bedtime.    VITAMIN D, ERGOCALCIFEROL, (DRISDOL) 50000 UNITS CAPS CAPSULE    Take 1 capsule (50,000 Units total) by mouth every 7 (seven) days.  Modified Medications   No  medications on file  Discontinued Medications   No medications on file     SIGNIFICANT DIAGNOSTIC EXAMS  07-26-15: chest x-ray: bilateral lower lobe atelectasis.   07-30-15: chest x-ray: no acute cardiopulmonary pathology   10-19-15: ct angio of chest: No evidence of pulmonary emboli. Findings of prior coronary bypass grafting with stenting. No acute abnormality noted.  10-22-15: 2-d echo: - Left ventricle: The cavity size was mildly dilated. Wall  thickness was increased in a pattern of mild LVH. Systolic function was moderately to severely reduced. The estimated ejection fraction was in the range of 30% to 35%. Difficult images even with Definity. There appears to be severe hypokinesis of the inferior and inferolateral walls. Features are consistent with a pseudonormal left ventricular filling pattern, with concomitant abnormal relaxation and increased filling pressure (grade 2 diastolic dysfunction). - Aortic valve: Trileaflet; moderately calcified leaflets. There was no stenosis. - Mitral valve: Moderately calcified annulus. Mildly calcified leaflets . There was moderate regurgitation. - Left atrium: The atrium was mildly dilated. - Right ventricle: Poorly visualized. The cavity size was normal. Wall thickness was normal. - Tricuspid valve: Peak RV-RA gradient (S): 24 mm Hg. - Pulmonary arteries: PA peak pressure: 39 mm Hg (S). - Systemic veins: IVC measured 2.6 cm with < 50% respirophasic   variation, suggesting RA pressure 15  mmHg.  10-31-15: ct of head: 1. No acute finding or change compared to prior. 2. Remote left MCA territory infarction. Remote small bilateral cerebellar infarcts.  11-06-15: abdominal aortogram lower extremity angiography:  #1  on the right, there is in-line flow from the groin down to the ankle. Two-vessel runoff on the anterior tibial and peroneal artery  #2  the left iliofemoral bypass graft is patent without stenosis.  There is ectasia/aneurysmal changes at the  distal anastomosis.  #3  the left femoral below-knee popliteal bypass graft is patent without stenosis.  There is single vessel runoff via the peroneal artery which reconstitutes the left posterior tibial artery at the ankle                  LABS REVIEWED:    02-23-15: tsh 4.032; vit d 11  03-08-15: wbc 7.7; hgb 11.5; hct 35.7; mcv 79.7; plt 156; glucose 115; bun 19; creat 0.96; k+ 4.6; na++146; liver normal albumin 3.4 INR 1.66  04-04-15: glucose 136; bun 19; creat 1.02; k+ 3.7; na++142 06-04-15: INR 2.77  06-18-15: INR 2.41  Urine for micro-albumin 0.7  06-25-15: wbc 8.0; hgb 12.1; hct 37.9 ;mcv 78.6; plt 159; glucose 122; bun 20; creat 0.93; k+ 4.3; na++142 liver normal albumin 3.6; chol 151; ldl 87; trig 162; hdl 32; hgb a1c 7.4 07-26-15: wbc 7.7; hgb 12.;3 hct 37.2; mcv 77.3; plt 106; glucose 161; bun 37; creat 1.75; k+ 4.3; na++136  07-30-15: wbc 12.8; hgb 10.7; hct 3.2; mcv 77.4; plt 174; g glucose 120; bun 43; creat 1.22; k+ 4.3; na++134 inr >10 (question reading of inr)  11-03-15: vit B12: 247; folate 19.9; iron 36; tibc 242; ferritin 127 11-09-15: wbc 11.7; hgb 9.1; hct 32.6; mcv 75.1; plt 171; glucose 169; bun 8; creat 0.83; k+ 3.4; na++ 140 11-10-15: wbc 10.5; hgb 8.9; hct 31.5; mcv 75.5; plt 164; glucose 146; bun 10; creat 0.79; k+ 3.7; na++ 136  12-03-15: left stump culture: proteus mirabilis: cipro    Review of Systems Constitutional: Negative for malaise/fatigue.  Respiratory: no cough or shortness of breath  Cardiovascular: Negative for chest pain, palpitations and leg swelling.  Gastrointestinal: Negative for heartburn, abdominal pain has constipation  Musculoskeletal: chronic back pain Skin: drainage on incision line    Neurological: Negative for headaches.  Psychiatric/Behavioral: . The patient is not nervous/anxious    Physical Exam Constitutional: He is oriented to person, place, and time. No distress.  Obese   Neck: Neck supple. No JVD present. No thyromegaly present.    Cardiovascular: Normal rate, regular rhythm and intact distal pulses.   Respiratory: Effort normal and breath sounds normal. No respiratory distress.  GI: Soft. Bowel sounds are normal. He exhibits no distension. There is no tenderness.  Musculoskeletal:  Is status post left aka  Is able to move all extremities 1+ right lower extremity edema   Neurological: He is alert and oriented to person, place, and time.  Skin: Skin is warm and dry. He is not diaphoretic.  left stump incision line has wound vac in place .  Psychiatric: He has a normal mood and affect.    ASSESSMENT/ PLAN:  1. Bilateral shoulder pain: will being lidoderm patch to bilateral shoulders  2. Constipation: will begin miralax daily      MD is aware of resident's narcotic use and is in agreement with current plan of care. We will attempt to wean resident as apropriate   Ok Edwards NP [facility]  Contact [phone] Monday through  Friday 8am- 5pm  After hours call [phone]

## 2015-12-27 ENCOUNTER — Encounter: Payer: Self-pay | Admitting: Adult Health

## 2015-12-27 ENCOUNTER — Non-Acute Institutional Stay (SKILLED_NURSING_FACILITY): Payer: Medicare Other | Admitting: Adult Health

## 2015-12-27 DIAGNOSIS — E876 Hypokalemia: Secondary | ICD-10-CM | POA: Diagnosis not present

## 2015-12-27 NOTE — Progress Notes (Signed)
Patient ID: [name], male   DOB: Aug 19, 1940, 75 y.o.   MRN: [id number]   Location:   [place] Room Number: 159-B Place of Service:  SNF (31)   CODE STATUS: DNR  Allergies  Allergen Reactions  . Diazepam Anxiety    REACTION: makes patient cry  . Vancomycin Other (See Comments)    "red man syndrome"    Chief Complaint  Patient presents with  . Acute Visit    Lab follow up    HPI:  He continues to have low k+ levels; his last at 3.2. He will require a k+ supplement. There are no nursing concerns at this time.    Past Medical History:  Diagnosis Date  . Adhesive capsulitis   . Angina   . Anxiety   . CAD (coronary artery disease)   . CHF (congestive heart failure) ([place])   . Chronic back pain   . Chronic kidney disease    hx of BPH  . COPD (chronic obstructive pulmonary disease) ([place])   . CVA (cerebral infarction) Questionable history  . Depression   . Diabetes mellitus   . Diverticulosis   . Falls frequently 06/2014  . GERD (gastroesophageal reflux disease)   . Hyperlipidemia   . Hypertension   . 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 Myocardial infarction   . Neuromuscular disorder ([place])    HX of diabetic periferal neuropathy  . On home oxygen therapy    "1.5L prn" (04/12/2014)  . Osteomyelitis ([place]) 2012   Sternoclavicular joint   . PE (pulmonary embolism) 10-15 years ago   Lifelong Coumadin  . Peripheral vascular disease ([place])   . Shortness of breath   . Sleep apnea     Past Surgical History:  Procedure Laterality Date  . AMPUTATION  03/16/2011   Procedure: AMPUTATION DIGIT;  Surgeon: Angelia Mould, MD;  Location: [facility] OR;  Service: Vascular;  Laterality: Left;  Great toe  . AMPUTATION Left 11/08/2015   Procedure: AMPUTATION ABOVE KNEE LEFT;  Surgeon: Waynetta Sandy, MD;  Location: [place];  Service: Vascular;  Laterality: Left;  . CARDIAC CATHETERIZATION  October 18, 2010   Known obstructive disease, no further blockages  . CARDIAC CATHETERIZATION  03/17/2005   EF 35-40%  . COLONOSCOPY  2006    Multiple polyps removed, repeat in 5 years  . CORONARY ARTERY BYPASS GRAFT  1992  . [place], 2002  . DOPPLER ECHOCARDIOGRAPHY  Sept 2011   EF 50-55% with some impaired diastolic relaxation  . FEMORAL-POPLITEAL BYPASS GRAFT  2008   Left  . FRACTURE SURGERY  1980s   Hip  . HIP ARTHROPLASTY Left   . PERIPHERAL VASCULAR CATHETERIZATION N/A 11/06/2015   Procedure: Abdominal Aortogram;  Surgeon: Serafina Mitchell, MD;  Location: [place] [facility];  Service: Cardiovascular;  Laterality: N/A;  . PERIPHERAL VASCULAR CATHETERIZATION Bilateral 11/06/2015   Procedure: Lower Extremity Angiography;  Surgeon: Serafina Mitchell, MD;  Location: [place] [facility];  Service: Cardiovascular;  Laterality: Bilateral;  . Right iliopopliteal bypass - [place]  . septic arthritis     Removal of infected CABG wire by Dr Arlyce Dice - 2011  . SPINE SURGERY  2002   Cervical fusion vertebroplasty C3-4-5  . US ECHOCARDIOGRAPHY  03/04/2006   EF 50-55%    Social History   Social History  . Marital status: Married    Spouse name: Butch Penny  .  Number of children: 3  . Years of education: 12   Occupational History  . Retired-machinest/welder Retired   Social History Main Topics  . Smoking status: Former Smoker    Quit date: 06/19/2000  . Smokeless tobacco: Never Used  . Alcohol use No  . Drug use: No  . Sexual activity: No   Other Topics Concern  . Not on file   Social History Narrative   Health Care POA: TBD   Emergency Contact: wife, Butch Penny [phone]   End of Life Plan: pt does not have AD and not interested.   Who lives with you: Butch Penny and step-daughter-Betty   Any pets: none   Diet: Pt has a varied diet of protein, starch and vegetables.   Exercise: Pt does not have regular exercise routine.   Seatbelts: Pt reports wearing seatbelt when in vehicles.     Hobbies: listening to car races.             Family History  Problem Relation Age of Onset  . Heart disease Father   . Heart disease Mother       VITAL SIGNS BP 132/74   Pulse 78   Temp 97.4 F (36.3 C) (Oral)   Resp 18   Ht 5\' 6"  (1.676 m)   Wt 237 lb (107.5 kg)   SpO2 97%   BMI 38.25 kg/m   Patient's Medications  New Prescriptions   No medications on file  Previous Medications   AMINO ACIDS-PROTEIN HYDROLYS (FEEDING SUPPLEMENT, PRO-STAT SUGAR FREE 64,) LIQD    Take 30 mLs by mouth 2 (two) times daily.   ASPIRIN EC 81 MG EC TABLET    Take 1 tablet (81 mg total) by mouth daily.   ATORVASTATIN (LIPITOR) 20 MG TABLET    Take 20 mg by mouth daily.   BISACODYL (DULCOLAX) 10 MG SUPPOSITORY    Place 1 suppository (10 mg total) rectally daily as needed for moderate constipation.   CARVEDILOL (COREG) 3.125 MG TABLET    Take 1 tablet (3.125 mg total) by mouth 2 (two) times daily with a meal.   COLLAGENASE (SANTYL) OINTMENT    Apply 1 application topically daily. Apply Santyl to left and right heel wounds Q day, then cover with moist gauze and dry kerlex   DOCUSATE SODIUM (COLACE) 100 MG CAPSULE    Take 100 mg by mouth 2 (two) times daily.   DULOXETINE (CYMBALTA) 30 MG CAPSULE    Take 90 mg by mouth daily.   FERROUS SULFATE 325 (65 FE) MG TABLET    Take 1 tablet (325 mg total) by mouth daily with breakfast.   FUROSEMIDE (LASIX) 80 MG TABLET    Take 1 tablet (80 mg total) by mouth 2 (two) times daily.   GABAPENTIN (NEURONTIN) 400 MG CAPSULE    Take 400 mg by mouth 3 (three) times daily.   GUAIFENESIN-DEXTROMETHORPHAN (ROBITUSSIN DM) 100-10 MG/5ML SYRUP    Take 15 mLs by mouth every 4 (four) hours as needed for cough.   HYDROCODONE-ACETAMINOPHEN (NORCO) 7.5-325 MG TABLET    Take 1 tablet by mouth every 4 (four) hours as needed for moderate pain or severe pain.   INSULIN LISPRO (HUMALOG) 100 UNIT/ML CARTRIDGE    Inject into the skin. Inject as per sliding scale: if  0-70=0; 71-149 = 8  units, 150-600= 13 units subcutaneously before meals related to DM.   INSULIN NPH HUMAN (HUMULIN N,NOVOLIN N) 100 UNIT/ML INJECTION    Inject 0.2 mLs (20 Units total) into the  skin 2 (two) times daily before a meal.   LIDOCAINE (LIDODERM) 5 %    Place 1 patch onto the skin daily.    LORATADINE (CLARITIN) 5 MG CHEWABLE TABLET    Chew 5 mg by mouth daily.   LORAZEPAM (ATIVAN) 0.5 MG TABLET    Take 1 tablet (0.5 mg total) by mouth 2 (two) times daily as needed for anxiety.   LUBIPROSTONE (AMITIZA) 8 MCG CAPSULE    Take 8 mcg by mouth 2 (two) times daily with a meal.   METHOCARBAMOL (ROBAXIN) 500 MG TABLET    Take 1 tablet (500 mg total) by mouth 3 (three) times daily.   MULTIPLE VITAMINS-MINERALS (DECUBI-VITE PO)    Take 1 tablet by mouth daily.    NITROGLYCERIN (NITROSTAT) 0.4 MG SL TABLET    Place 1 tablet (0.4 mg total) under the tongue as needed. For chest pains   OMEPRAZOLE (PRILOSEC) 40 MG CAPSULE    Take 1 capsule (40 mg total) by mouth daily.   OXYGEN    Inhale 2 L into the lungs as needed (for congestion, cough, and wheezing related to COPD).   PROPYLENE GLYCOL (SYSTANE BALANCE) 0.6 % SOLN    Place 1 drop into both eyes 2 (two) times daily.    RIVAROXABAN (XARELTO) 20 MG TABS TABLET    Take 20 mg by mouth daily with supper.   SACCHAROMYCES BOULARDII (FLORASTOR) 250 MG CAPSULE    Take 250 mg by mouth 2 (two) times daily.   SENNA-DOCUSATE (SENOKOT-S) 8.6-50 MG TABLET    Take 1 tablet by mouth 2 (two) times daily.   SKIN PROTECTANTS, MISC. (CALAZIME SKIN PROTECTANT EX)    Apply topically. Apply to sacrum and right buttock every day shift   TAMSULOSIN (FLOMAX) 0.4 MG CAPS CAPSULE    Take 1 capsule (0.4 mg total) by mouth daily after supper.   TRAZODONE (DESYREL) 100 MG TABLET    Take 100 mg by mouth at bedtime.    VITAMIN D, ERGOCALCIFEROL, (DRISDOL) 50000 UNITS CAPS CAPSULE    Take 1 capsule (50,000 Units total) by mouth every 7 (seven) days.  Modified Medications   No medications on file    Discontinued Medications   No medications on file     SIGNIFICANT DIAGNOSTIC EXAMS  09-04-14: bilateral hip and pelvic x-ray: Left hip arthroplasty with mild acetabular protrusio. No fracture or dislocation is seen. No interval change.  09-04-14: right femur x-ray: No fracture or dislocation is seen.   09-04-14: left femur x-ray: No acute abnormality.  09-13-14: left hip and pelvic x-ray: No acute bony abnormality.   09-13-14: right shoulder x-ray: Advanced degenerative change in the shoulder joint. Negative for Fracture.  12-22-14: right hand and right wrist x-ray: no acute process; right hand osteoarthritis present.   07-26-15: chest x-ray: bilateral lower lobe atelectasis.   07-30-15: chest x-ray: no acute cardiopulmonary pathology     LABS REVIEWED:   12-25-14: inr 3.03  02-23-15: tsh 4.032; vit d 11  03-08-15: wbc 7.7; hgb 11.5; hct 35.7; mcv 79.7; plt 156; glucose 115; bun 19; creat 0.96; k+ 4.6; na++146; liver normal albumin 3.4 INR 1.66  04-04-15: glucose 136; bun 19; creat 1.02; k+ 3.7; na++142 06-04-15: INR 2.77  06-18-15: INR 2.41  Urine for micro-albumin 0.7  06-25-15: wbc 8.0; hgb 12.1; hct 37.9 ;mcv 78.6; plt 159; glucose 122; bun 20; creat 0.93; k+ 4.3; na++142 liver normal albumin 3.6; chol 151; ldl 87; trig 162; hdl 32; hgb a1c 7.4 07-26-15: wbc 7.7;  hgb 12.;3 hct 37.2; mcv 77.3; plt 106; glucose 161; bun 37; creat 1.75; k+ 4.3; na++136  07-30-15: wbc 12.8; hgb 10.7; hct 3.2; mcv 77.4; plt 174; g glucose 120; bun 43; creat 1.22; k+ 4.3; na++134 inr >10 (question reading of inr)  12-19-15: glucose 217; bun 13; creat 0.74; k+ 3.0; na++ 142; liver normal albumin 3.3; hgb a1c 5.7 12-26-15: glucose 103; bun 15; creat 0.73; k+ 3.2; na++ 142; liver normal albumin 3.2     Review of Systems Constitutional: Negative for malaise/fatigue.  Respiratory: no cough or shortness of breath  Cardiovascular: Negative for chest pain, palpitations and leg swelling.  Gastrointestinal: Negative for  heartburn, abdominal pain and constipation.  Musculoskeletal: chronic back pain Skin: drainage on incision line    Neurological: Negative for headaches.  Psychiatric/Behavioral: . The patient is not nervous/anxious    Physical Exam Constitutional: He is oriented to person, place, and time. No distress.  Obese   Neck: Neck supple. No JVD present. No thyromegaly present.  Cardiovascular: Normal rate, regular rhythm and intact distal pulses.   Respiratory: Effort normal and breath sounds normal. No respiratory distress.  GI: Soft. Bowel sounds are normal. He exhibits no distension. There is no tenderness.  Musculoskeletal:  Is status post left aka  Is able to move all extremities 1+ right 2lower extremity edema   Neurological: He is alert and oriented to person, place, and time.  Skin: Skin is warm and dry. He is not diaphoretic.  left stump incision line is red; inflamed; with purulent drainage present.  Psychiatric: He has a normal mood and affect.    ASSESSMENT/ PLAN:  1. Hypokalemia: will begin k+ 20 meq daily and will check k+ level in one week.     MD is aware of resident's narcotic use and is in agreement with current plan of care. We will attempt to wean resident as appropriate.     Ok Edwards NP [facility]  Contact [phone] Monday through Friday 8am- 5pm  After hours call [phone]

## 2016-01-11 IMAGING — CT CT CERVICAL SPINE W/O CM
4 of 6 series · 14 of 33 positions shown, 16 images · non-contrast
Comparison: CT Head max cervical 07/17/2014

CLINICAL DATA: Fall. No loss of consciousness. Patient is on
warfarin. Hurts all over.

EXAM:
CT HEAD WITHOUT CONTRAST
CT CERVICAL SPINE WITHOUT CONTRAST
TECHNIQUE: Multidetector CT imaging of the head and cervical spine was
performed following the standard protocol without intravenous
contrast. Multiplanar CT image reconstructions of the cervical spine
were also generated.

[Series 5: c-spine st · axial · 0.23mm/px · z∈[-270,-186]mm · 3 of 84 slices shown, 4 images]
[im 21/84  soft-tissue]
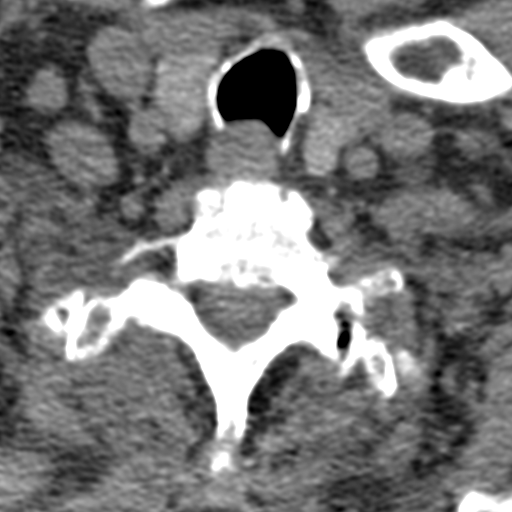
[im 21/84  bone]
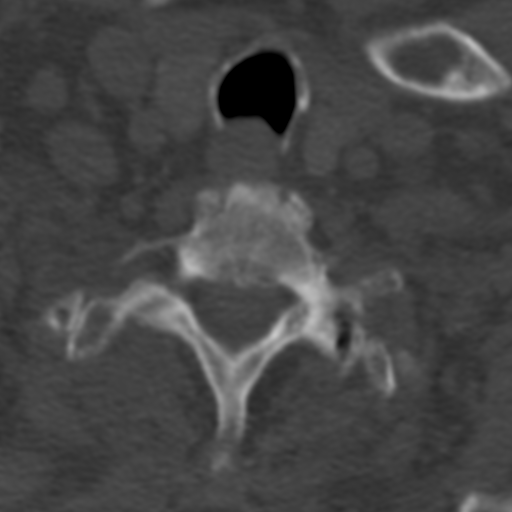
[im 42/84  bone]
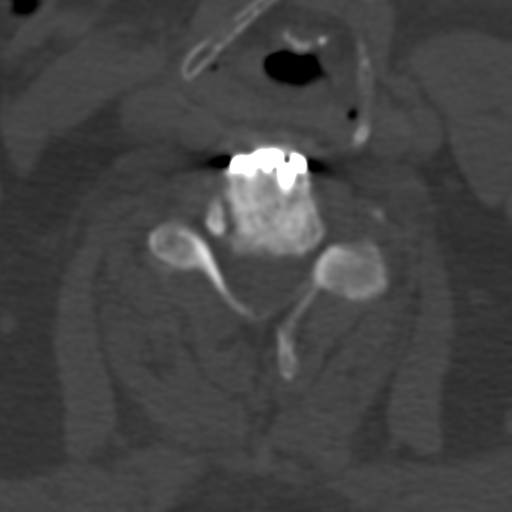
[im 63/84  bone]
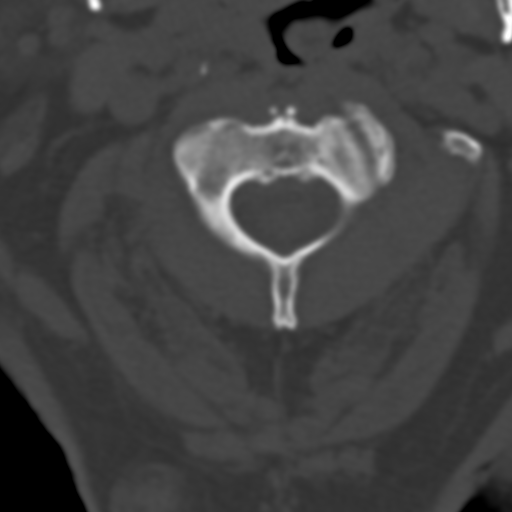

[Series 8: axial recon · axial · 0.23mm/px · z∈[-290,-220]mm · 3 of 83 slices shown]
[im 21/83  bone]
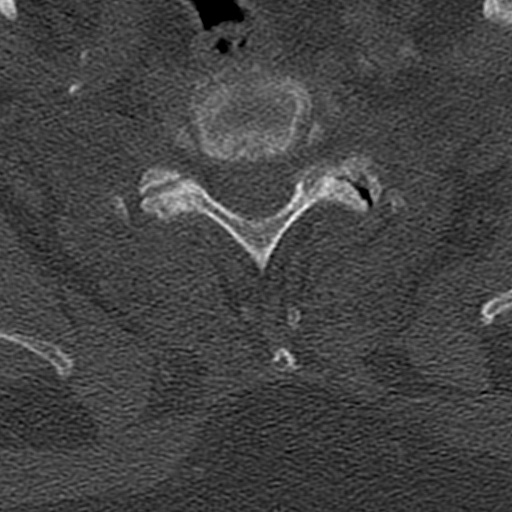
[im 42/83  bone]
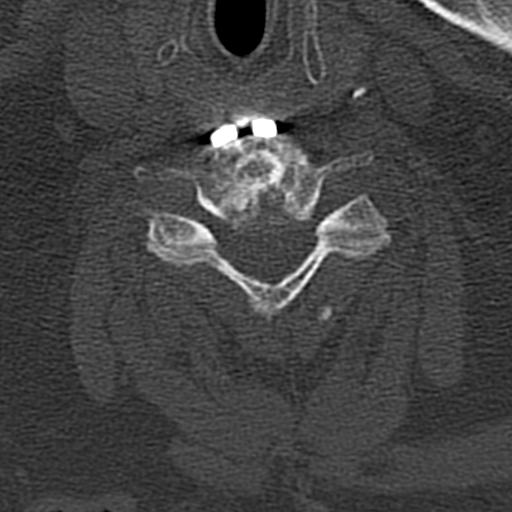
[im 62/83  bone]
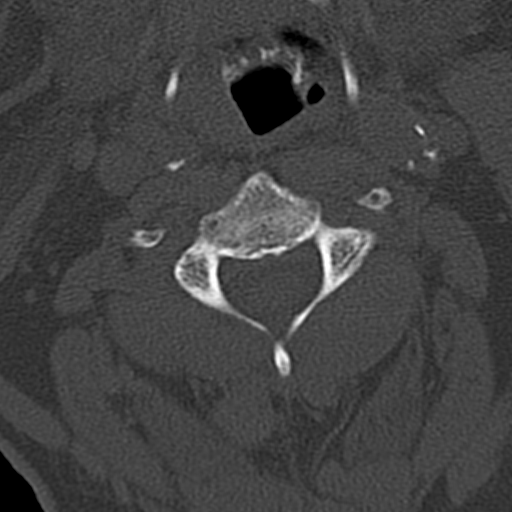

[Series 9: coronal · coronal · 0.24mm/px · 3 of 35 slices shown]
[im 7/35  bone]
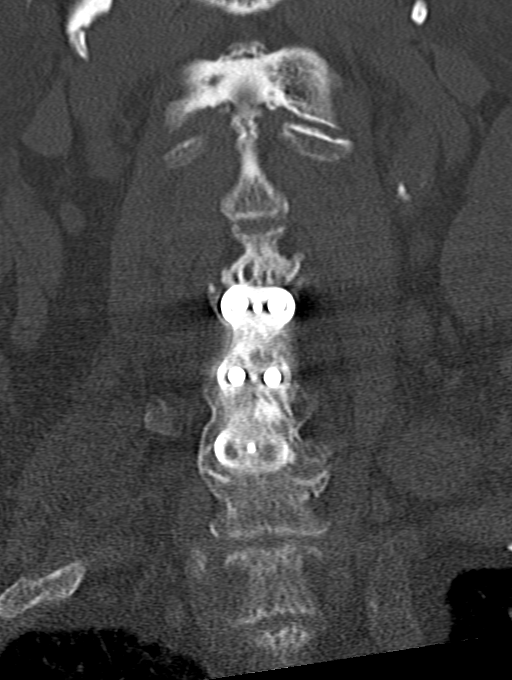
[im 14/35  bone]
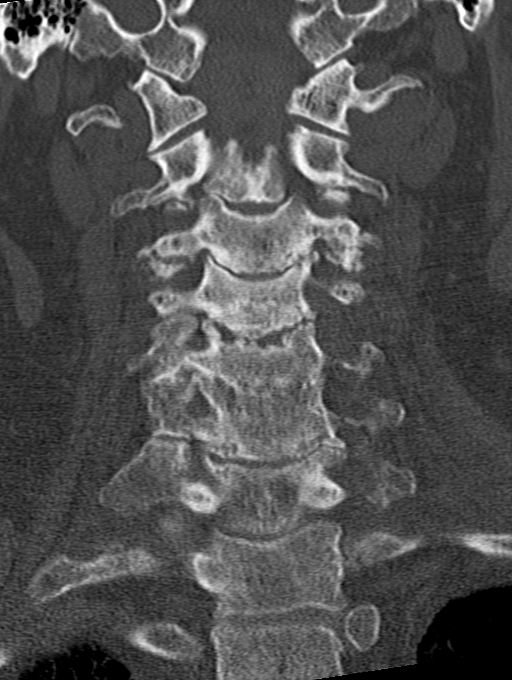
[im 21/35  bone]
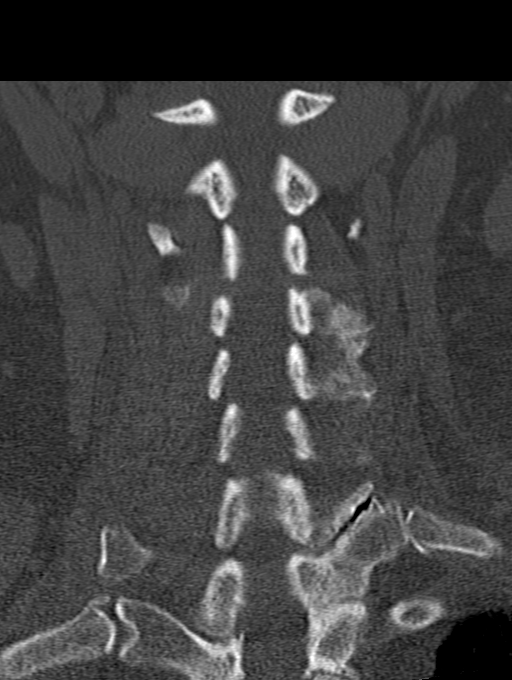

[Series 10: sagittal · sagittal · 0.25mm/px · 5 of 39 slices shown, 6 images]
[im 13/39  bone]
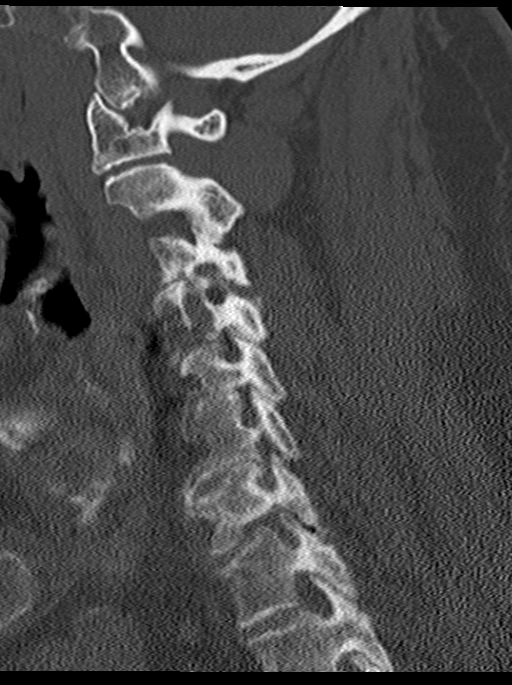
[im 16/39  bone]
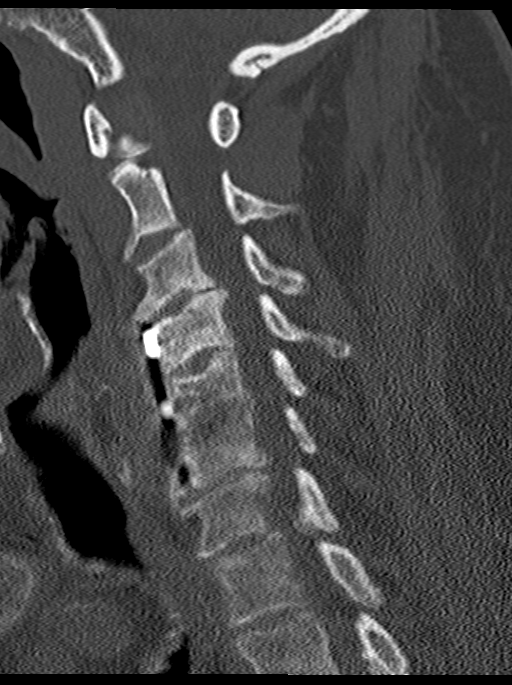
[im 20/39  soft-tissue]
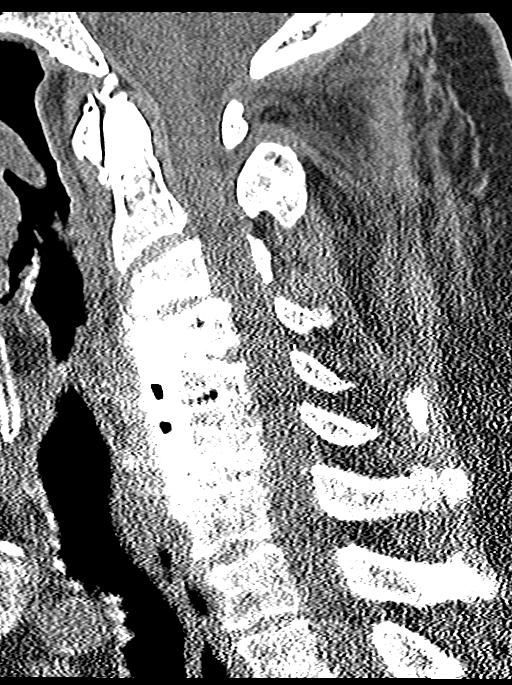
[im 20/39  bone]
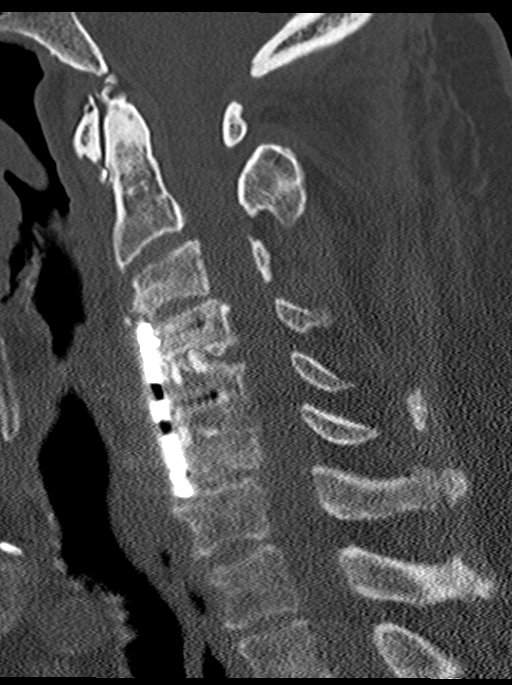
[im 23/39  bone]
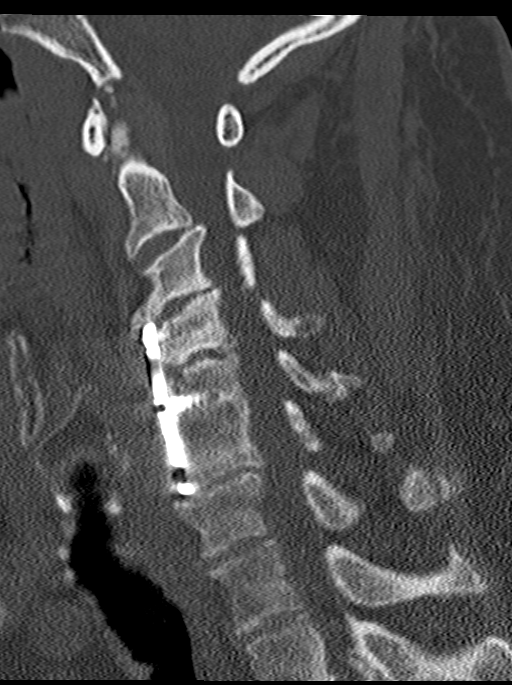
[im 26/39  bone]
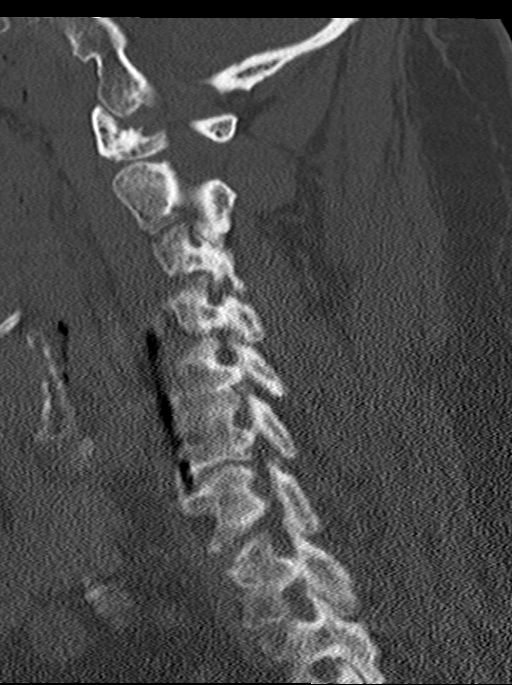

[14 of 33 positions shown; findings below may reference images not displayed]

FINDINGS: CT HEAD FINDINGS

Encephalomalacia consistent with old infarct in the left parietal
and occipital region. Diffuse cerebral atrophy. Ventricular
dilatation consistent with central atrophy. Low-attenuation changes
in the deep white matter consistent with small vessel ischemia. No
mass effect or midline shift. No abnormal extra-axial fluid
collections. Gray-white matter junctions are distinct. Basal
cisterns are not effaced. No evidence of acute intracranial
hemorrhage. No depressed skull fractures. Visualized paranasal
sinuses and mastoid air cells are not opacified. Vascular
calcifications.

CT CERVICAL SPINE FINDINGS

Postoperative changes with anterior plate and screw fixation and
intervertebral disc fusion from C4 through C6. Degenerative changes
in the remainder of the cervical spine. Degenerative changes in the
facet joints. Normal alignment of the cervical spine. No vertebral
compression deformities. No prevertebral soft tissue swelling. Soft
tissue calcification over the spinous processes. C1-2 articulation
appears intact. No focal bone lesion or bone destruction. Bone
cortex and trabecular architecture appear intact. Degenerative cysts
in C2. Vascular calcifications in the cervical carotid arteries.
Emphysematous changes in the lung apices.
IMPRESSION: No acute intracranial abnormalities. Old left parietal occipital
infarct. Diffuse atrophy and small vessel ischemic changes.

Postoperative changes with cervical spine fusion from C4 through C6.
Degenerative changes. No acute displaced fractures identified.

## 2016-01-21 ENCOUNTER — Non-Acute Institutional Stay (SKILLED_NURSING_FACILITY): Payer: Medicare Other | Admitting: Adult Health

## 2016-01-21 ENCOUNTER — Encounter: Payer: Self-pay | Admitting: Adult Health

## 2016-01-21 DIAGNOSIS — E1142 Type 2 diabetes mellitus with diabetic polyneuropathy: Secondary | ICD-10-CM

## 2016-01-21 DIAGNOSIS — I130 Hypertensive heart and chronic kidney disease with heart failure and stage 1 through stage 4 chronic kidney disease, or unspecified chronic kidney disease: Secondary | ICD-10-CM

## 2016-01-21 DIAGNOSIS — I739 Peripheral vascular disease, unspecified: Secondary | ICD-10-CM | POA: Diagnosis not present

## 2016-01-21 DIAGNOSIS — E1169 Type 2 diabetes mellitus with other specified complication: Secondary | ICD-10-CM

## 2016-01-21 DIAGNOSIS — I25810 Atherosclerosis of coronary artery bypass graft(s) without angina pectoris: Secondary | ICD-10-CM | POA: Diagnosis not present

## 2016-01-21 DIAGNOSIS — J431 Panlobular emphysema: Secondary | ICD-10-CM

## 2016-01-21 DIAGNOSIS — I5022 Chronic systolic (congestive) heart failure: Secondary | ICD-10-CM | POA: Diagnosis not present

## 2016-01-21 DIAGNOSIS — E785 Hyperlipidemia, unspecified: Secondary | ICD-10-CM

## 2016-01-21 DIAGNOSIS — I2782 Chronic pulmonary embolism: Secondary | ICD-10-CM | POA: Diagnosis not present

## 2016-01-21 DIAGNOSIS — D509 Iron deficiency anemia, unspecified: Secondary | ICD-10-CM

## 2016-01-21 NOTE — Progress Notes (Signed)
Patient ID: [name], male   DOB: November 08, 1940, 75 y.o.   MRN: [id number]    Location:   [facility] Room Number: 159-B Place of Service:  SNF (31)   CODE STATUS: DNR  Allergies  Allergen Reactions  . Diazepam Anxiety    REACTION: makes patient cry  . Vancomycin Other (See Comments)    "red man syndrome"    Chief Complaint  Patient presents with  . Medical Management of Chronic Issues    Follow up    HPI:  He is a long term resident of this facility being seen for the management of his chronic illnesses. He has been treated for a wound infection over the past month. He continues to have lower extremity pain with burning present. He does get out of bed daily. There are no nursing concerns at this time.   Past Medical History:  Diagnosis Date  . Adhesive capsulitis   . Angina   . Anxiety   . CAD (coronary artery disease)   . CHF (congestive heart failure) ([place])   . Chronic back pain   . Chronic kidney disease    hx of BPH  . COPD (chronic obstructive pulmonary disease) ([place])   . CVA (cerebral infarction) Questionable history  . Depression   . Diabetes mellitus   . Diverticulosis   . Falls frequently 06/2014  . GERD (gastroesophageal reflux disease)   . Hyperlipidemia   . Hypertension   . 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 Myocardial infarction   . Neuromuscular disorder ([place])    HX of diabetic periferal neuropathy  . On home oxygen therapy    "1.5L prn" (04/12/2014)  . Osteomyelitis ([place]) 2012   Sternoclavicular joint   . PE (pulmonary embolism) 10-15 years ago   Lifelong Coumadin  . Peripheral vascular disease ([place])   . Shortness of breath   . Sleep apnea     Past Surgical History:  Procedure Laterality Date  . AMPUTATION  03/16/2011   Procedure: AMPUTATION DIGIT;  Surgeon: Angelia Mould, MD;  [facility];  Service: Vascular;  Laterality: Left;  Great toe  . AMPUTATION Left  11/08/2015   Procedure: AMPUTATION ABOVE KNEE LEFT;  Surgeon: Waynetta Sandy, MD;  Location: [place];  Service: Vascular;  Laterality: Left;  . CARDIAC CATHETERIZATION  October 18, 2010   Known obstructive disease, no further blockages  . CARDIAC CATHETERIZATION  03/17/2005   EF 35-40%  . COLONOSCOPY  2006    Multiple polyps removed, repeat in 5 years  . CORONARY ARTERY BYPASS GRAFT  1992  . [place], 2002  . DOPPLER ECHOCARDIOGRAPHY  Sept 2011   EF 50-55% with some impaired diastolic relaxation  . FEMORAL-POPLITEAL BYPASS GRAFT  2008   Left  . FRACTURE SURGERY  1980s   Hip  . HIP ARTHROPLASTY Left   . PERIPHERAL VASCULAR CATHETERIZATION N/A 11/06/2015   Procedure: Abdominal Aortogram;  Surgeon: Serafina Mitchell, MD;  Location: [place];  Service: Cardiovascular;  Laterality: N/A;  . PERIPHERAL VASCULAR CATHETERIZATION Bilateral 11/06/2015   Procedure: Lower Extremity Angiography;  Surgeon: Serafina Mitchell, MD;  Location: [place] [facility];  Service: Cardiovascular;  Laterality: Bilateral;  . Right iliopopliteal bypass - [place]  . septic arthritis     Removal of infected CABG wire by Dr Arlyce Dice - 2011  . SPINE SURGERY  2002   Cervical  fusion vertebroplasty C3-4-5  . US ECHOCARDIOGRAPHY  03/04/2006   EF 50-55%    Social History   Social History  . Marital status: Married    Spouse name: Butch Penny  . Number of children: 3  . Years of education: 12   Occupational History  . Retired-machinest/welder Retired   Social History Main Topics  . Smoking status: Former Smoker    Quit date: 06/19/2000  . Smokeless tobacco: Never Used  . Alcohol use No  . Drug use: No  . Sexual activity: No   Other Topics Concern  . Not on file   Social History Narrative   Health Care POA: TBD   Emergency Contact: wife, Butch Penny [phone]   End of Life Plan: pt does not have AD and not interested.   Who lives with you: Butch Penny and step-daughter-Betty   Any pets:  none   Diet: Pt has a varied diet of protein, starch and vegetables.   Exercise: Pt does not have regular exercise routine.   Seatbelts: Pt reports wearing seatbelt when in vehicles.    Hobbies: listening to car races.             Family History  Problem Relation Age of Onset  . Heart disease Father   . Heart disease Mother       VITAL SIGNS BP 118/60   Pulse 100   Temp 98.1 F (36.7 C) (Oral)   Resp 16   Ht 5\' 6"  (1.676 m)   Wt 236 lb (107 kg)   SpO2 97%   BMI 38.09 kg/m   Patient's Medications  New Prescriptions   No medications on file  Previous Medications   AMINO ACIDS-PROTEIN HYDROLYS (FEEDING SUPPLEMENT, PRO-STAT SUGAR FREE 64,) LIQD    Take 30 mLs by mouth 2 (two) times daily.   ASPIRIN EC 81 MG EC TABLET    Take 1 tablet (81 mg total) by mouth daily.   ATORVASTATIN (LIPITOR) 20 MG TABLET    Take 20 mg by mouth daily.   BISACODYL (DULCOLAX) 10 MG SUPPOSITORY    Place 1 suppository (10 mg total) rectally daily as needed for moderate constipation.   CARVEDILOL (COREG) 3.125 MG TABLET    Take 1 tablet (3.125 mg total) by mouth 2 (two) times daily with a meal.   COLLAGENASE (SANTYL) OINTMENT    Apply 1 application topically daily. Apply Santyl to left and right heel wounds Q day, then cover with moist gauze and dry kerlex   DOCUSATE SODIUM (COLACE) 100 MG CAPSULE    Take 100 mg by mouth 2 (two) times daily.   DULOXETINE (CYMBALTA) 30 MG CAPSULE    Take 90 mg by mouth daily.   FERROUS SULFATE 325 (65 FE) MG TABLET    Take 1 tablet (325 mg total) by mouth daily with breakfast.   FUROSEMIDE (LASIX) 80 MG TABLET    Take 1 tablet (80 mg total) by mouth 2 (two) times daily.   GABAPENTIN (NEURONTIN) 400 MG CAPSULE    Take 400 mg by mouth 3 (three) times daily.   GUAIFENESIN-DEXTROMETHORPHAN (ROBITUSSIN DM) 100-10 MG/5ML SYRUP    Take 15 mLs by mouth every 4 (four) hours as needed for cough.   HYDROCODONE-ACETAMINOPHEN (NORCO) 7.5-325 MG TABLET    Take 1 tablet by mouth every  4 (four) hours as needed for moderate pain or severe pain.   INSULIN LISPRO (HUMALOG) 100 UNIT/ML CARTRIDGE    Inject into the skin. Inject as per sliding scale: if  0-70=0;  71-149 = 8 units, 150-600= 13 units subcutaneously before meals related to DM.   INSULIN NPH HUMAN (HUMULIN N,NOVOLIN N) 100 UNIT/ML INJECTION    Inject 0.2 mLs (20 Units total) into the skin 2 (two) times daily before a meal.   LIDOCAINE (LIDODERM) 5 %    Place 1 patch onto the skin daily.    LORAZEPAM (ATIVAN) 0.5 MG TABLET    Take 1 tablet (0.5 mg total) by mouth 2 (two) times daily as needed for anxiety.   LUBIPROSTONE (AMITIZA) 8 MCG CAPSULE    Take 8 mcg by mouth 2 (two) times daily with a meal.   METHOCARBAMOL (ROBAXIN) 500 MG TABLET    Take 1 tablet (500 mg total) by mouth 3 (three) times daily.   MULTIPLE VITAMINS-MINERALS (DECUBI-VITE PO)    Take 1 tablet by mouth daily.    NITROGLYCERIN (NITROSTAT) 0.4 MG SL TABLET    Place 1 tablet (0.4 mg total) under the tongue as needed. For chest pains   OMEPRAZOLE (PRILOSEC) 40 MG CAPSULE    Take 1 capsule (40 mg total) by mouth daily.   OXYGEN    Inhale 2 L into the lungs as needed (for congestion, cough, and wheezing related to COPD).   POTASSIUM CHLORIDE SA (K-DUR,KLOR-CON) 20 MEQ TABLET    Take 20 mEq by mouth 2 (two) times daily.   PROPYLENE GLYCOL (SYSTANE BALANCE) 0.6 % SOLN    Place 1 drop into both eyes 2 (two) times daily.    RIVAROXABAN (XARELTO) 20 MG TABS TABLET    Take 20 mg by mouth daily with supper.   SENNA-DOCUSATE (SENOKOT-S) 8.6-50 MG TABLET    Take 1 tablet by mouth 2 (two) times daily.   SKIN PROTECTANTS, MISC. (CALAZIME SKIN PROTECTANT EX)    Apply topically. Apply to sacrum and right buttock every day shift   TAMSULOSIN (FLOMAX) 0.4 MG CAPS CAPSULE    Take 1 capsule (0.4 mg total) by mouth daily after supper.   TRAZODONE (DESYREL) 100 MG TABLET    Take 100 mg by mouth at bedtime.    VITAMIN D, ERGOCALCIFEROL, (DRISDOL) 50000 UNITS CAPS CAPSULE    Take 1  capsule (50,000 Units total) by mouth every 7 (seven) days.  Modified Medications   No medications on file  Discontinued Medications   LORATADINE (CLARITIN) 5 MG CHEWABLE TABLET    Chew 5 mg by mouth daily.     SIGNIFICANT DIAGNOSTIC EXAMS    09-13-14: right shoulder x-ray: Advanced degenerative change in the shoulder joint. Negative for Fracture.  12-22-14: right hand and right wrist x-ray: no acute process; right hand osteoarthritis present.   07-26-15: chest x-ray: bilateral lower lobe atelectasis.   07-30-15: chest x-ray: no acute cardiopulmonary pathology    10-22-15: 2-d echo: - Mildly dilated LV with mild LV hypertrophy. Difficult images even with Definity, but EF appears to be 30-35% with severe hypokinesis of the inferior and inferolateral walls. Moderate diastolic dysfunction. The RV was poorly visualized, probably mildly decreased systolic function. Moderate mitral regurgitation. Dilated IVC with mild pulmonary hypertension.     LABS REVIEWED:   02-23-15: tsh 4.032; vit d 11  03-08-15: wbc 7.7; hgb 11.5; hct 35.7; mcv 79.7; plt 156; glucose 115; bun 19; creat 0.96; k+ 4.6; na++146; liver normal albumin 3.4 INR 1.66  04-04-15: glucose 136; bun 19; creat 1.02; k+ 3.7; na++142 06-04-15: INR 2.77  06-18-15: INR 2.41  Urine for micro-albumin 0.7  06-25-15: wbc 8.0; hgb 12.1; hct 37.9 ;mcv 78.6; plt  159; glucose 122; bun 20; creat 0.93; k+ 4.3; na++142 liver normal albumin 3.6; chol 151; ldl 87; trig 162; hdl 32; hgb a1c 7.4 07-26-15: wbc 7.7; hgb 12.;3 hct 37.2; mcv 77.3; plt 106; glucose 161; bun 37; creat 1.75; k+ 4.3; na++136  07-30-15: wbc 12.8; hgb 10.7; hct 3.2; mcv 77.4; plt 174; g glucose 120; bun 43; creat 1.22; k+ 4.3; na++134 inr >10 (question reading of inr)  12-03-15: wound culture: proteus mirabilis: cipro  12-19-15: glucose 217; bun 13; creat 0.74; k+ 3.0; na++ 142; liver normal albumin 3.3; hgb a1c 5.7 12-26-15: glucose 103; bun 15; creat 0.73; k+ 3.2; na++ 142; liver normal  albumin 3.2 01-04-16: k+ 3.5     Review of Systems Constitutional: Negative for malaise/fatigue.  Respiratory: no cough or shortness of breath  Cardiovascular: Negative for chest pain, palpitations and leg swelling.  Gastrointestinal: Negative for heartburn, abdominal pain and constipation.  Musculoskeletal: chronic back pain has burning pain present in right leg.  Skin: denies complaints   Neurological: Negative for headaches.  Psychiatric/Behavioral: . The patient is not nervous/anxious    Physical Exam Constitutional: He is oriented to person, place, and time. No distress.  Obese   Neck: Neck supple. No JVD present. No thyromegaly present.  Cardiovascular: Normal rate, regular rhythm and intact distal pulses.   Respiratory: Effort normal and breath sounds normal. No respiratory distress.  GI: Soft. Bowel sounds are normal. He exhibits no distension. There is no tenderness.  Musculoskeletal:  Is status post left aka  Is able to move all extremities 1+ right 2lower extremity edema   Neurological: He is alert and oriented to person, place, and time.  Skin: Skin is warm and dry. He is not diaphoretic.  right outer heel: 1.8 x 2.1 DTI: foam dressing   Psychiatric: He has a normal mood and affect.    ASSESSMENT/ PLAN:  1. Chronic systolic heart failure: EF 30-35% (10-22-15): will continue lasix 80 mg twice daily with k+ 20 meq twice daily coreg 3.125 mg twice daily   2. Hypertension: will continue coreg 3.125 mg twice daily   3. CAD: status post CABG and stents: will continue asa 81 mg daily and ntg prn  4. Chronic PE: is stable will continue xarelto 20 mg daily  5. Dyslipidemia: ldl 87 will continue liptor 20 mg daily   6. Diabetes: hgb a1c 5.7; will continue NPH 20 units twice daily and humalog 8 units with meals with an additional 3 units for cbg >150  7. PVD: will continue asa 81 mg is status post left aka  8. Anemia: hgb 10.7 will continue iron daily   9. COPD:  is on chronic 02 therapy; takes flonase daily  10. Constipation: will continue colace twice daily senna s twice daily amitiza 8 mcg twice daily   11. Peripheral neuropathy: will continue cymbalta 90 mg daily and will increase neurontin to 600 mg three times daily robaxin 500 mg three times daily for spasms  12. Anxiety with depression: will continue cymbalta 90 mg daily also takes for pain; will continue ativan 0.5 mg twice daily as needed  13. Gerd: will continue prilosec 40 mg daily  14. Bilateral shoulder pain: will continue lidoderm patch; will continue vicodin 5.325 mg every 4 hours as needed   15. Vit D deficiency: will continue vitamin d 50,000 units weekly        MD is aware of resident's narcotic use and is in agreement with current plan of care. We will attempt  to wean resident as apropriate   Ok Edwards NP [facility]  Contact [phone] Monday through Friday 8am- 5pm  After hours call [phone]

## 2016-01-23 ENCOUNTER — Encounter: Payer: Self-pay | Admitting: Vascular Surgery

## 2016-01-24 ENCOUNTER — Encounter (HOSPITAL_COMMUNITY): Payer: Self-pay

## 2016-01-24 ENCOUNTER — Inpatient Hospital Stay (HOSPITAL_COMMUNITY): Payer: Medicare Other

## 2016-01-24 ENCOUNTER — Emergency Department (HOSPITAL_COMMUNITY): Payer: Medicare Other

## 2016-01-24 ENCOUNTER — Inpatient Hospital Stay (HOSPITAL_COMMUNITY)
Admission: EM | Admit: 2016-01-24 | Discharge: 2016-01-29 | DRG: 871 | Disposition: A | Discharge status: Skilled Nursing Facility | Payer: Medicare Other | Attending: Family Medicine | Admitting: Family Medicine

## 2016-01-24 DIAGNOSIS — L0390 Cellulitis, unspecified: Secondary | ICD-10-CM

## 2016-01-24 DIAGNOSIS — I5022 Chronic systolic (congestive) heart failure: Secondary | ICD-10-CM | POA: Diagnosis present

## 2016-01-24 DIAGNOSIS — E1122 Type 2 diabetes mellitus with diabetic chronic kidney disease: Secondary | ICD-10-CM | POA: Diagnosis present

## 2016-01-24 DIAGNOSIS — L89152 Pressure ulcer of sacral region, stage 2: Secondary | ICD-10-CM | POA: Diagnosis present

## 2016-01-24 DIAGNOSIS — Z6841 Body Mass Index (BMI) 40.0 and over, adult: Secondary | ICD-10-CM | POA: Diagnosis not present

## 2016-01-24 DIAGNOSIS — L03115 Cellulitis of right lower limb: Secondary | ICD-10-CM | POA: Diagnosis present

## 2016-01-24 DIAGNOSIS — F419 Anxiety disorder, unspecified: Secondary | ICD-10-CM | POA: Diagnosis present

## 2016-01-24 DIAGNOSIS — G9340 Encephalopathy, unspecified: Secondary | ICD-10-CM

## 2016-01-24 DIAGNOSIS — N400 Enlarged prostate without lower urinary tract symptoms: Secondary | ICD-10-CM | POA: Diagnosis present

## 2016-01-24 DIAGNOSIS — E1151 Type 2 diabetes mellitus with diabetic peripheral angiopathy without gangrene: Secondary | ICD-10-CM | POA: Diagnosis present

## 2016-01-24 DIAGNOSIS — Z7901 Long term (current) use of anticoagulants: Secondary | ICD-10-CM

## 2016-01-24 DIAGNOSIS — L97419 Non-pressure chronic ulcer of right heel and midfoot with unspecified severity: Secondary | ICD-10-CM | POA: Diagnosis present

## 2016-01-24 DIAGNOSIS — J449 Chronic obstructive pulmonary disease, unspecified: Secondary | ICD-10-CM | POA: Diagnosis present

## 2016-01-24 DIAGNOSIS — Z7982 Long term (current) use of aspirin: Secondary | ICD-10-CM

## 2016-01-24 DIAGNOSIS — Z89612 Acquired absence of left leg above knee: Secondary | ICD-10-CM | POA: Diagnosis not present

## 2016-01-24 DIAGNOSIS — Z8673 Personal history of transient ischemic attack (TIA), and cerebral infarction without residual deficits: Secondary | ICD-10-CM | POA: Diagnosis not present

## 2016-01-24 DIAGNOSIS — K219 Gastro-esophageal reflux disease without esophagitis: Secondary | ICD-10-CM | POA: Diagnosis present

## 2016-01-24 DIAGNOSIS — G4733 Obstructive sleep apnea (adult) (pediatric): Secondary | ICD-10-CM | POA: Diagnosis present

## 2016-01-24 DIAGNOSIS — Z955 Presence of coronary angioplasty implant and graft: Secondary | ICD-10-CM

## 2016-01-24 DIAGNOSIS — E11621 Type 2 diabetes mellitus with foot ulcer: Secondary | ICD-10-CM | POA: Diagnosis present

## 2016-01-24 DIAGNOSIS — G9341 Metabolic encephalopathy: Secondary | ICD-10-CM | POA: Diagnosis present

## 2016-01-24 DIAGNOSIS — I4891 Unspecified atrial fibrillation: Secondary | ICD-10-CM | POA: Diagnosis present

## 2016-01-24 DIAGNOSIS — R509 Fever, unspecified: Secondary | ICD-10-CM | POA: Diagnosis present

## 2016-01-24 DIAGNOSIS — I2510 Atherosclerotic heart disease of native coronary artery without angina pectoris: Secondary | ICD-10-CM | POA: Diagnosis present

## 2016-01-24 DIAGNOSIS — Z66 Do not resuscitate: Secondary | ICD-10-CM | POA: Diagnosis present

## 2016-01-24 DIAGNOSIS — M869 Osteomyelitis, unspecified: Secondary | ICD-10-CM

## 2016-01-24 DIAGNOSIS — R6521 Severe sepsis with septic shock: Secondary | ICD-10-CM | POA: Diagnosis present

## 2016-01-24 DIAGNOSIS — K5909 Other constipation: Secondary | ICD-10-CM | POA: Diagnosis present

## 2016-01-24 DIAGNOSIS — A419 Sepsis, unspecified organism: Secondary | ICD-10-CM | POA: Diagnosis present

## 2016-01-24 DIAGNOSIS — I2782 Chronic pulmonary embolism: Secondary | ICD-10-CM | POA: Diagnosis present

## 2016-01-24 DIAGNOSIS — N189 Chronic kidney disease, unspecified: Secondary | ICD-10-CM | POA: Diagnosis present

## 2016-01-24 DIAGNOSIS — Z96642 Presence of left artificial hip joint: Secondary | ICD-10-CM | POA: Diagnosis present

## 2016-01-24 DIAGNOSIS — Z79899 Other long term (current) drug therapy: Secondary | ICD-10-CM

## 2016-01-24 DIAGNOSIS — I252 Old myocardial infarction: Secondary | ICD-10-CM

## 2016-01-24 DIAGNOSIS — Z9981 Dependence on supplemental oxygen: Secondary | ICD-10-CM | POA: Diagnosis not present

## 2016-01-24 DIAGNOSIS — F329 Major depressive disorder, single episode, unspecified: Secondary | ICD-10-CM | POA: Diagnosis present

## 2016-01-24 DIAGNOSIS — E785 Hyperlipidemia, unspecified: Secondary | ICD-10-CM | POA: Diagnosis present

## 2016-01-24 DIAGNOSIS — I130 Hypertensive heart and chronic kidney disease with heart failure and stage 1 through stage 4 chronic kidney disease, or unspecified chronic kidney disease: Secondary | ICD-10-CM | POA: Diagnosis present

## 2016-01-24 DIAGNOSIS — Z794 Long term (current) use of insulin: Secondary | ICD-10-CM

## 2016-01-24 DIAGNOSIS — Z87891 Personal history of nicotine dependence: Secondary | ICD-10-CM

## 2016-01-24 DIAGNOSIS — Z951 Presence of aortocoronary bypass graft: Secondary | ICD-10-CM

## 2016-01-24 DIAGNOSIS — E876 Hypokalemia: Secondary | ICD-10-CM | POA: Diagnosis present

## 2016-01-24 LAB — COMPREHENSIVE METABOLIC PANEL
ALT: 18 U/L (ref 17–63)
ANION GAP: 11 (ref 5–15)
AST: 21 U/L (ref 15–41)
Albumin: 2.7 g/dL — ABNORMAL LOW (ref 3.5–5.0)
Alkaline Phosphatase: 81 U/L (ref 38–126)
BUN: 17 mg/dL (ref 6–20)
CHLORIDE: 91 mmol/L — AB (ref 101–111)
CO2: 29 mmol/L (ref 22–32)
CREATININE: 0.99 mg/dL (ref 0.61–1.24)
Calcium: 8.7 mg/dL — ABNORMAL LOW (ref 8.9–10.3)
Glucose, Bld: 181 mg/dL — ABNORMAL HIGH (ref 65–99)
POTASSIUM: 2.9 mmol/L — AB (ref 3.5–5.1)
SODIUM: 131 mmol/L — AB (ref 135–145)
Total Bilirubin: 0.9 mg/dL (ref 0.3–1.2)
Total Protein: 6.2 g/dL — ABNORMAL LOW (ref 6.5–8.1)

## 2016-01-24 LAB — CBC WITH DIFFERENTIAL/PLATELET
BASOS ABS: 0 10*3/uL (ref 0.0–0.1)
Basophils Relative: 0 %
EOS PCT: 0 %
Eosinophils Absolute: 0 10*3/uL (ref 0.0–0.7)
HEMATOCRIT: 36.2 % — AB (ref 39.0–52.0)
Hemoglobin: 11.5 g/dL — ABNORMAL LOW (ref 13.0–17.0)
LYMPHS ABS: 1.4 10*3/uL (ref 0.7–4.0)
Lymphocytes Relative: 4 %
MCH: 24.1 pg — ABNORMAL LOW (ref 26.0–34.0)
MCHC: 31.8 g/dL (ref 30.0–36.0)
MCV: 75.9 fL — ABNORMAL LOW (ref 78.0–100.0)
MONOS PCT: 5 %
Monocytes Absolute: 1.8 10*3/uL — ABNORMAL HIGH (ref 0.1–1.0)
NEUTROS PCT: 91 %
Neutro Abs: 31.9 10*3/uL — ABNORMAL HIGH (ref 1.7–7.7)
PLATELETS: 288 10*3/uL (ref 150–400)
RBC: 4.77 MIL/uL (ref 4.22–5.81)
RDW: 16.9 % — AB (ref 11.5–15.5)
WBC: 35.1 10*3/uL — AB (ref 4.0–10.5)

## 2016-01-24 LAB — URINALYSIS, ROUTINE W REFLEX MICROSCOPIC
Bilirubin Urine: NEGATIVE
Glucose, UA: NEGATIVE mg/dL
Hgb urine dipstick: NEGATIVE
KETONES UR: NEGATIVE mg/dL
LEUKOCYTES UA: NEGATIVE
NITRITE: NEGATIVE
PH: 6 (ref 5.0–8.0)
PROTEIN: NEGATIVE mg/dL
Specific Gravity, Urine: 1.015 (ref 1.005–1.030)

## 2016-01-24 LAB — GLUCOSE, CAPILLARY: Glucose-Capillary: 158 mg/dL — ABNORMAL HIGH (ref 65–99)

## 2016-01-24 LAB — I-STAT CG4 LACTIC ACID, ED
LACTIC ACID, VENOUS: 1.9 mmol/L (ref 0.5–1.9)
LACTIC ACID, VENOUS: 1.57 mmol/L (ref 0.5–1.9)

## 2016-01-24 LAB — CORTISOL: CORTISOL PLASMA: 19.4 ug/dL

## 2016-01-24 MED ORDER — VANCOMYCIN HCL 10 G IV SOLR
2000.0000 mg | Freq: Once | INTRAVENOUS | Status: AC
  Administered 2016-01-24: 2000 mg via INTRAVENOUS
  Filled 2016-01-24: qty 2000

## 2016-01-24 MED ORDER — SODIUM CHLORIDE 0.9 % IV BOLUS (SEPSIS)
1000.0000 mL | Freq: Once | INTRAVENOUS | Status: AC
  Administered 2016-01-24: 1000 mL via INTRAVENOUS

## 2016-01-24 MED ORDER — NOREPINEPHRINE BITARTRATE 1 MG/ML IV SOLN
2.0000 ug/min | INTRAVENOUS | Status: DC
  Administered 2016-01-24: 2 ug/min via INTRAVENOUS
  Filled 2016-01-24: qty 4

## 2016-01-24 MED ORDER — SODIUM CHLORIDE 0.9% FLUSH
3.0000 mL | Freq: Two times a day (BID) | INTRAVENOUS | Status: DC
  Administered 2016-01-24: 10 mL via INTRAVENOUS

## 2016-01-24 MED ORDER — PIPERACILLIN-TAZOBACTAM 3.375 G IVPB
3.3750 g | Freq: Three times a day (TID) | INTRAVENOUS | Status: DC
  Filled 2016-01-24: qty 50

## 2016-01-24 MED ORDER — FAMOTIDINE IN NACL 20-0.9 MG/50ML-% IV SOLN
20.0000 mg | Freq: Two times a day (BID) | INTRAVENOUS | Status: DC
  Administered 2016-01-25 (×3): 20 mg via INTRAVENOUS
  Filled 2016-01-24 (×5): qty 50

## 2016-01-24 MED ORDER — PIPERACILLIN-TAZOBACTAM 3.375 G IVPB 30 MIN
3.3750 g | Freq: Once | INTRAVENOUS | Status: AC
  Administered 2016-01-24: 3.375 g via INTRAVENOUS
  Filled 2016-01-24: qty 50

## 2016-01-24 MED ORDER — ACETAMINOPHEN 325 MG PO TABS: 650.0000 mg | ORAL_TABLET | Freq: Once | ORAL | Status: DC

## 2016-01-24 MED ORDER — HEPARIN (PORCINE) IN NACL 100-0.45 UNIT/ML-% IJ SOLN
2000.0000 [IU]/h | INTRAMUSCULAR | Status: AC
  Administered 2016-01-25: 1600 [IU]/h via INTRAVENOUS
  Administered 2016-01-26: 2000 [IU]/h via INTRAVENOUS
  Administered 2016-01-26: 1800 [IU]/h via INTRAVENOUS
  Administered 2016-01-27 – 2016-01-28 (×3): 2000 [IU]/h via INTRAVENOUS
  Filled 2016-01-24 (×12): qty 250

## 2016-01-24 MED ORDER — POTASSIUM CHLORIDE CRYS ER 20 MEQ PO TBCR
40.0000 meq | EXTENDED_RELEASE_TABLET | Freq: Once | ORAL | Status: AC
  Administered 2016-01-24: 40 meq via ORAL
  Filled 2016-01-24: qty 2

## 2016-01-24 MED ORDER — SODIUM CHLORIDE 0.9% FLUSH: 3.0000 mL | INTRAVENOUS | Status: DC | PRN

## 2016-01-24 MED ORDER — ACETAMINOPHEN 325 MG PO TABS
650.0000 mg | ORAL_TABLET | Freq: Once | ORAL | Status: AC
  Administered 2016-01-24: 650 mg via ORAL
  Filled 2016-01-24: qty 2

## 2016-01-24 MED ORDER — SODIUM CHLORIDE 0.9 % IV SOLN
250.0000 mL | INTRAVENOUS | Status: DC | PRN
  Administered 2016-01-25: 250 mL via INTRAVENOUS

## 2016-01-24 MED ORDER — VANCOMYCIN HCL 10 G IV SOLR
1250.0000 mg | Freq: Two times a day (BID) | INTRAVENOUS | Status: DC
  Administered 2016-01-25 – 2016-01-26 (×4): 1250 mg via INTRAVENOUS
  Filled 2016-01-24 (×7): qty 1250

## 2016-01-24 MED ORDER — INSULIN ASPART 100 UNIT/ML ~~LOC~~ SOLN
2.0000 [IU] | SUBCUTANEOUS | Status: DC
  Administered 2016-01-25 (×2): 4 [IU] via SUBCUTANEOUS
  Administered 2016-01-25 (×2): 2 [IU] via SUBCUTANEOUS
  Administered 2016-01-25: 4 [IU] via SUBCUTANEOUS
  Administered 2016-01-25: 2 [IU] via SUBCUTANEOUS
  Administered 2016-01-26 (×2): 4 [IU] via SUBCUTANEOUS

## 2016-01-24 MED ORDER — HYDROCORTISONE NA SUCCINATE PF 100 MG IJ SOLR
50.0000 mg | Freq: Four times a day (QID) | INTRAMUSCULAR | Status: DC
  Administered 2016-01-25 – 2016-01-27 (×9): 50 mg via INTRAVENOUS
  Filled 2016-01-24 (×9): qty 2

## 2016-01-24 MED ORDER — SODIUM CHLORIDE 0.9 % IV BOLUS (SEPSIS)
500.0000 mL | Freq: Once | INTRAVENOUS | Status: AC
  Administered 2016-01-24: 500 mL via INTRAVENOUS

## 2016-01-24 MED ORDER — SODIUM CHLORIDE 0.9 % IV SOLN: 250.0000 mL | INTRAVENOUS | Status: DC | PRN

## 2016-01-24 MED ORDER — PIPERACILLIN-TAZOBACTAM 3.375 G IVPB
3.3750 g | Freq: Three times a day (TID) | INTRAVENOUS | Status: DC
  Administered 2016-01-25 – 2016-01-28 (×11): 3.375 g via INTRAVENOUS
  Filled 2016-01-24 (×13): qty 50

## 2016-01-24 MED ORDER — PIPERACILLIN-TAZOBACTAM 3.375 G IVPB: 3.3750 g | Freq: Three times a day (TID) | INTRAVENOUS | Status: DC

## 2016-01-24 NOTE — Progress Notes (Signed)
[facility] for heparin Indication: history of PE  Allergies  Allergen Reactions  . Diazepam Anxiety    REACTION: makes patient cry  . Vancomycin Other (See Comments)    "red man syndrome"    Patient Measurements: Height: 5\' 6"  (167.6 cm) Weight: 234 lb (106.1 kg) IBW/kg (Calculated) : 63.8 Heparin Dosing Weight: 88 kg  Vital Signs: Temp: 101.4 F (38.6 C) (11/02 1729) Temp Source: Rectal (11/02 1729) BP: 99/47 (11/02 2135) Pulse Rate: 82 (11/02 2135)  Labs:  Recent Labs  01/24/16 1709  HGB 11.5*  HCT 36.2*  PLT 288  CREATININE 0.99    Estimated Creatinine Clearance: 71.3 mL/min (by C-G formula based on SCr of 0.99 mg/dL).   Medical History: Past Medical History:  Diagnosis Date  . Adhesive capsulitis   . Angina   . Anxiety   . CAD (coronary artery disease)   . CHF (congestive heart failure) ([place])   . Chronic back pain   . Chronic kidney disease    hx of BPH  . COPD (chronic obstructive pulmonary disease) ([place])   . CVA (cerebral infarction) Questionable history  . Depression   . Diabetes mellitus   . Diverticulosis   . Falls frequently 06/2014  . GERD (gastroesophageal reflux disease)   . Hyperlipidemia   . Hypertension   . 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 Myocardial infarction   . Neuromuscular disorder ([place])    HX of diabetic periferal neuropathy  . On home oxygen therapy    "1.5L prn" (04/12/2014)  . Osteomyelitis ([place]) 2012   Sternoclavicular joint   . PE (pulmonary embolism) 10-15 years ago   Lifelong Coumadin  . Peripheral vascular disease ([place])   . Shortness of breath   . Sleep apnea     Assessment: 76 yo male on Xarelto PTA for history of PE admitted 11/2 with septic shock 2/2 to RLE cellulitis.  Xarelto on hold and pharmacy to assist with transitioning to heparin infusion.  Last dose of Xarelto on 11/1 at unknown time.  CBC stable.   Goal of Therapy:   Heparin level 0.3-0.7 units/ml aPTT 66-102 seconds Monitor platelets by anticoagulation protocol: Yes   Plan:  1. Begin heparin infusion at 1600 units/hr 2. Baseline heparin level and aPTT 3. Obtain aPTT in 8 hours after starting infusion  4. Continue daily heparin level and aPTT until levels correlate   Vincenza Hews, PharmD, BCPS 01/24/2016, 10:00 PM Pager: [phone]

## 2016-01-24 NOTE — H&P (Signed)
PULMONARY / CRITICAL CARE MEDICINE   Name: [name] MRN: [id number] DOB: 1940/10/25    ADMISSION DATE:  01/24/2016 CONSULTATION DATE:  01/24/2016  REFERRING MD:  Dr. 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 He resides in SNF. He was in his usual state of health until the morning of 11/2 when he was noted to have fevers by nursing homes staff. On their evaluation he was found to have erythema to his right lower extrimity concerning for cellulitis. He was transported to the emergency department for further evaluation. In the ER he was also hypotensive code sepsis was initiated. He was started on broad-spectrum antibiotics and IV fluid resuscitation, however, systolic blood pressure remained low in the 80s. There was concern that he would require vasopressors and ICU admission, therefore PCCM asked to admit.  PAST MEDICAL HISTORY :  He  has a past medical history of Adhesive capsulitis; Angina; Anxiety; CAD (coronary artery disease); CHF (congestive heart failure) ([place]); Chronic back pain; Chronic kidney disease; COPD (chronic obstructive pulmonary disease) ([facility]); CVA (cerebral infarction) (Questionable history); Depression; Diabetes mellitus; Diverticulosis; Falls frequently (06/2014); GERD (gastroesophageal reflux disease); Hyperlipidemia; Hypertension; MRSA bacteremia; Myocardial infarction; Neuromuscular disorder ([place]); On home oxygen therapy; Osteomyelitis ([place]) (2012); PE (pulmonary embolism) (10-15 years ago); Peripheral vascular disease ([place]); Shortness of breath; and Sleep apnea.  PAST SURGICAL HISTORY: He  has a past surgical history that includes septic arthritis; Coronary artery bypass graft (1992); Fracture  surgery (1980s); Spine surgery (2002); Coronary stent placement (1999, 2002); doppler echocardiography (Sept 2011); Colonoscopy (2006 ); Right iliopopliteal bypass - 1983 (1983); Femoral-popliteal Bypass Graft (2008); Cardiac catheterization (October 18, 2010); Cardiac catheterization (03/17/2005); US ECHOCARDIOGRAPHY (03/04/2006); Amputation (03/16/2011); Hip Arthroplasty (Left); Cardiac catheterization (N/A, 11/06/2015); Cardiac catheterization (Bilateral, 11/06/2015); and Amputation (Left, 11/08/2015).  Allergies  Allergen Reactions  . Diazepam Anxiety    REACTION: makes patient cry  . Vancomycin Other (See Comments)    "red man syndrome"    No current facility-administered medications on file prior to encounter.    Current Outpatient Prescriptions on File Prior to Encounter  Medication Sig  . Amino Acids-Protein Hydrolys (FEEDING SUPPLEMENT, PRO-STAT SUGAR FREE 64,) LIQD Take 30 mLs by mouth 2 (two) times daily. (Patient taking differently: Take 60 mLs by mouth 3 (three) times daily with meals. )  . aspirin EC 81 MG EC tablet Take 1 tablet (81 mg total) by mouth daily.  Marland Kitchen atorvastatin (LIPITOR) 20 MG tablet Take 20 mg by mouth daily.  . bisacodyl (DULCOLAX) 10 MG suppository Place 1 suppository (10 mg total) rectally daily as needed for moderate constipation.  . carvedilol (COREG) 3.125 MG tablet Take 1 tablet (3.125 mg total) by mouth 2 (two) times daily with a meal.  . collagenase (SANTYL) ointment Apply 1 application topically daily. Apply Santyl to left and right heel wounds Q day, then cover with moist gauze and dry kerlex  . docusate sodium (COLACE) 100 MG capsule Take 100 mg by mouth 2 (two) times daily.  . DULoxetine (CYMBALTA) 30 MG capsule Take 90 mg by mouth daily.  . ferrous sulfate 325 (65 FE) MG tablet Take 1 tablet (325 mg total) by mouth daily with breakfast.  . furosemide (LASIX) 80 MG tablet Take 1 tablet (80  mg total) by mouth 2 (two) times daily.  Marland Kitchen gabapentin (NEURONTIN)  400 MG capsule Take 400 mg by mouth 3 (three) times daily.  Marland Kitchen guaiFENesin-dextromethorphan (ROBITUSSIN DM) 100-10 MG/5ML syrup Take 15 mLs by mouth every 4 (four) hours as needed for cough.  Marland Kitchen HYDROcodone-acetaminophen (NORCO) 7.5-325 MG tablet Take 1 tablet by mouth every 4 (four) hours as needed for moderate pain or severe pain.  Marland Kitchen insulin lispro (HUMALOG) 100 UNIT/ML cartridge Inject into the skin. Inject as per sliding scale: if  0-70=0; 71-149 = 8 units, 150-600= 13 units subcutaneously before meals related to DM.  Marland Kitchen insulin NPH Human (HUMULIN N,NOVOLIN N) 100 UNIT/ML injection Inject 0.2 mLs (20 Units total) into the skin 2 (two) times daily before a meal. (Patient taking differently: Inject 20 Units into the skin. 20 units before meals)  . lidocaine (LIDODERM) 5 % Place 1 patch onto the skin daily.   Marland Kitchen LORazepam (ATIVAN) 0.5 MG tablet Take 1 tablet (0.5 mg total) by mouth 2 (two) times daily as needed for anxiety.  Marland Kitchen lubiprostone (AMITIZA) 8 MCG capsule Take 8 mcg by mouth 2 (two) times daily with a meal.  . methocarbamol (ROBAXIN) 500 MG tablet Take 1 tablet (500 mg total) by mouth 3 (three) times daily.  . Multiple Vitamins-Minerals (DECUBI-VITE PO) Take 1 tablet by mouth daily.   . nitroGLYCERIN (NITROSTAT) 0.4 MG SL tablet Place 1 tablet (0.4 mg total) under the tongue as needed. For chest pains  . omeprazole (PRILOSEC) 40 MG capsule Take 1 capsule (40 mg total) by mouth daily.  . OXYGEN Inhale 2 L into the lungs as needed (for congestion, cough, and wheezing related to COPD).  Marland Kitchen potassium chloride SA (K-DUR,KLOR-CON) 20 MEQ tablet Take 20 mEq by mouth 2 (two) times daily.  Marland Kitchen Propylene Glycol (SYSTANE BALANCE) 0.6 % SOLN Place 1 drop into both eyes 2 (two) times daily.   . rivaroxaban (XARELTO) 20 MG TABS tablet Take 20 mg by mouth daily with supper.  . senna-docusate (SENOKOT-S) 8.6-50 MG tablet Take 1 tablet by mouth 2 (two) times daily.  . Skin Protectants, Misc. (CALAZIME SKIN  PROTECTANT EX) Apply topically. Apply to sacrum and right buttock every day shift  . tamsulosin (FLOMAX) 0.4 MG CAPS capsule Take 1 capsule (0.4 mg total) by mouth daily after supper.  . traZODone (DESYREL) 100 MG tablet Take 100 mg by mouth at bedtime.   . Vitamin D, Ergocalciferol, (DRISDOL) 50000 units CAPS capsule Take 1 capsule (50,000 Units total) by mouth every 7 (seven) days.    FAMILY HISTORY:  His indicated that his mother is deceased. He indicated that his father is deceased.    SOCIAL HISTORY: He  reports that he quit smoking about 15 years ago. He has never used smokeless tobacco. He reports that he does not drink alcohol or use drugs.  REVIEW OF SYSTEMS:   unable  SUBJECTIVE:    VITAL SIGNS: BP (!) 100/49   Pulse 80   Temp 101.4 F (38.6 C) (Rectal)   Resp 20   Ht 5\' 6"  (1.676 m)   Wt 106.1 kg (234 lb)   SpO2 100%   BMI 37.77 kg/m   HEMODYNAMICS:    VENTILATOR SETTINGS:    INTAKE / OUTPUT: I/O last 3 completed shifts: In: 80 [IV Piggyback:50] Out: 400 [Urine:400]  PHYSICAL EXAMINATION: General:  Morbidly obese male Neuro:  Somnolent. Oriented to self, place, and president. Thinks its 1979. HEENT:  [place]/AT, PERRL, no JVD Cardiovascular:  RRR, no  MRG Lungs:  Clear, distant Abdomen:  Soft, non-tender, non-distended Musculoskeletal: Recent L AKA with poorly healing wounds on stump.  Skin:  Erythema and warmth to RLE.   LABS:  BMET  Recent Labs Lab 01/24/16 1709  NA 131*  K 2.9*  CL 91*  CO2 29  BUN 17  CREATININE 0.99  GLUCOSE 181*    Electrolytes  Recent Labs Lab 01/24/16 1709  CALCIUM 8.7*    CBC  Recent Labs Lab 01/24/16 1709  WBC 35.1*  HGB 11.5*  HCT 36.2*  PLT 288    Coag's No results for input(s): APTT, INR in the last 168 hours.  Sepsis Markers  Recent Labs Lab 01/24/16 1719 01/24/16 2036  LATICACIDVEN 1.90 1.57    ABG No results for input(s): PHART, PCO2ART, PO2ART in the last 168 hours.  Liver  Enzymes  Recent Labs Lab 01/24/16 1709  AST 21  ALT 18  ALKPHOS 81  BILITOT 0.9  ALBUMIN 2.7*    Cardiac Enzymes No results for input(s): TROPONINI, PROBNP in the last 168 hours.  Glucose No results for input(s): GLUCAP in the last 168 hours.  Imaging Dg Chest Portable 1 View  Result Date: 01/24/2016 CLINICAL DATA:  Sepsis. EXAM: PORTABLE CHEST 1 VIEW COMPARISON:  10/31/2015 FINDINGS: Mildly degraded exam due to AP portable technique and patient body habitus. Prior median sternotomy. Numerous leads and wires project over the chest. Apical lordotic positioning. Right glenohumeral joint osteoarthritis. Patient rotated right. Mild cardiomegaly. Left hemidiaphragm elevation. No right and no definite left pleural effusion. No pneumothorax. Low lung volumes with resultant pulmonary interstitial prominence. No overt congestive failure. Mild left base atelectasis is similar. IMPRESSION: No acute process or explanation for sepsis Cardiomegaly with left hemidiaphragm elevation and adjacent volume loss/atelectasis. Electronically Signed   By: Abigail Miyamoto M.D.   On: 01/24/2016 18:00     STUDIES:  Xray RLE 11/2 >  CULTURES: Blood 11/2 >  ANTIBIOTICS: Zosyn 11/2 > Vancomycin 11/2 >  SIGNIFICANT EVENTS: 10/2015 > admit for osteo with L AKA 01/24/2016 admit for cellulitis septic shock.  LINES/TUBES:   DISCUSSION:   ASSESSMENT / PLAN:  PULMONARY A: COPD without acute exacerbation At risk volume overload OSA unclear CPAP  P:   Continue home O2 IS as able KVO fluids s/p 4L in ED Monitor DNI ABG if MS worsens.  CARDIOVASCULAR A:  Septic shock secondary to RLE cellulitis. Chronic systolic CHF  CAD  P:  Telemetry monitoring Hypotensive but lactic and creat wnl Start low dose levo Stress dose steroids Trend troponin.  No need repeat lactic unless clinically worsens.  Echo from august  RENAL A:   Hypokalemia - replaced in ED  P:   Repeat BMP tonight and in  AM Replace electrolytes as indicated  GASTROINTESTINAL A:   GERD  P:   NPO   HEMATOLOGIC A:   Chronic PE on Xarelto  P:  Heparin per pharmacy Hold xarelto  INFECTIOUS A:   Septic shock secondary to cellulitis Poorly healing wounds L stump  P:   ABX as above Wound care consult Follow cultures Xray R leg to assess free air, may need osteo eval given history.  ENDOCRINE A:   DM   P:   CBG monitoring and SSI  NEUROLOGIC A:   Acute metabolic encephalopathy  P:   RASS goal: 0 Monitor   FAMILY  - Updates: Could not reach wife, but FPTS did and relayed info to me.  - Inter-disciplinary family meet or Palliative Care meeting  due by:  11/9   Georgann Housekeeper, AGACNP-BC [place] [facility]/Critical Care Pager [phone] or [phone]  01/24/2016 9:32 PM  Attending Note:  75 year old diabetic male with a-fib who presents to the hospital with lethargy and was noted to be hypotensive in the 80's and was febrile in SNF.  Patient was originally DNR/DNI but family was agreeable to pressors.  On exam, wound on the left stump noted with malodorous discharge.  Cellulitis of the right leg noted and is expanding.  I reviewed CXR myself, no acute disease noted.  Patient is mentating on 4 mcg of levophed.  Stress dose steroids started.  Broad spectrum with vanc/zosyn started.  Pan cultures ordered.  Will need wound care consultation.  May need surgical evaluation for stump infection and cellulitis of the right leg.  Leg x-ray with no air noted.  Will need further discussion regarding plan of care, not sure if patient would want the other leg amputated.  PCCM will follow.  The patient is critically ill with multiple organ systems failure and requires high complexity decision making for assessment and support, frequent evaluation and titration of therapies, application of advanced monitoring technologies and extensive interpretation of multiple databases.   Critical Care Time  devoted to patient care services described in this note is  45  Minutes. This time reflects time of care of this signee Dr Jennet Maduro. This critical care time does not reflect procedure time, or teaching time or supervisory time of PA/NP/Med student/Med Resident etc but could involve care discussion time.  [name], M.D. [facility] Pulmonary/Critical Care Medicine. Pager: [phone]. After hours pager: [phone].

## 2016-01-24 NOTE — ED Provider Notes (Signed)
[facility] DEPT Provider Note   CSN: [id number] Arrival date & time: 01/24/16  1650     History   Chief Complaint Chief Complaint  Patient presents with  . Fever  . Leg Pain    HPI [name] is a 75 y.o. male.  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 Per nursing note, patient was found to have acute onset of redness to his right leg has a fever of 101. Patient report he has had generalized weakness, fever, and neck pain for at least 3 days. Pain is moderate in intensity, sharp and achy, worsening with movement. He denies chills, headache, productive cough, shortness of breath, abdominal pain, urinary discomfort. He is a poor historian. For most questions he normally responds "sometimes".  Level V caveats applies.   Past Medical History:  Diagnosis Date  . Adhesive capsulitis   . Angina   . Anxiety   . CAD (coronary artery disease)   . CHF (congestive heart failure) ([place])   . Chronic back pain   . Chronic kidney disease    hx of BPH  . COPD (chronic obstructive pulmonary disease) ([place])   . CVA (cerebral infarction) Questionable history  . Depression   . Diabetes mellitus   . Diverticulosis   . Falls frequently 06/2014  . GERD (gastroesophageal reflux disease)   . Hyperlipidemia   . Hypertension   . 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 Myocardial infarction   . Neuromuscular disorder ([place])    HX of diabetic periferal neuropathy  . On home oxygen therapy    "1.5L prn" (04/12/2014)  . Osteomyelitis ([place]) 2012   Sternoclavicular joint   . PE (pulmonary embolism) 10-15 years ago   Lifelong Coumadin  . Peripheral vascular disease ([place])   . Shortness of breath   . Sleep apnea     Patient Active Problem List     Diagnosis Date Noted  . Panlobular emphysema ([place])   . Chronic osteomyelitis of left foot ([place])   . Acute renal failure ([place])   . Gangrene ([place])   . Uncontrolled type 2 diabetes mellitus with complication ([place])   . Septic shock ([place]) 10/31/2015  . AKI (acute kidney injury) ([place]) 10/31/2015  . Diabetic ulcer of left foot ([place]) 10/31/2015  . Ulcer of foot due to diabetes ([place]) 10/31/2015  . Hypokalemia   . Pressure ulcer 10/24/2015  . Dyspnea   . Acute on chronic congestive heart failure ([place])   . Acute on chronic systolic (congestive) heart failure 10/19/2015  . Acute on chronic respiratory failure with hypoxia ([place]) 10/19/2015  . Microcytic anemia 10/19/2015  . Atherosclerosis of native arteries of the extremities with ulceration ([place]) 08/03/2015  . Low back pain with sciatica 07/30/2015  . Chronic pulmonary embolism ([place]) 06/18/2015  . Diabetic peripheral neuropathy associated with type 2 diabetes mellitus ([place]) 06/18/2015  . Allergic rhinitis 05/22/2015  . Cellulitis of left lower extremity 05/22/2015  . Venous ulcer of left leg ([place]) 05/22/2015  . Constipation due to opioid therapy 05/06/2015  . Vitamin D deficiency 05/03/2015  . Type II diabetes mellitus with neurological manifestations ([place]) 02/15/2015  . Diabetic osteomyelitis ([place]) 02/15/2015  . Bilateral edema of lower extremity   . Dehydration 07/18/2014  . Diarrhea 07/18/2014  . Benign  paroxysmal positional vertigo 05/26/2014  . OA (osteoarthritis) 05/12/2014  . Unsteady gait 04/12/2014  . Orthostatic hypotension 05/16/2013  . Generalized weakness 04/30/2013  . At high risk for falls 07/19/2012  . Falls 02/17/2012  . Bradycardia 01/22/2012  . Hemorrhoid 01/12/2012  . GERD (gastroesophageal reflux disease) 11/10/2011  . Leg edema 09/20/2011  . Peripheral vascular disease ([place]) 04/16/2011  . Osteomyelitis ([place]) 03/13/2011  . Chronic obstructive pulmonary disease ([place]) 02/27/2011  . Stasis dermatitis 12/04/2010   . BPH (benign prostatic hyperplasia) 11/01/2010  . Long term current use of anticoagulant therapy 05/03/2010  . FROZEN RIGHT SHOULDER 02/25/2010  . Bilateral shoulder pain 11/19/2009  . Depression 07/08/2006  . Essential hypertension 07/08/2006  . HYPERCHOLESTEROLEMIA 05/21/2006  . Anxiety state 05/21/2006  . Coronary atherosclerosis 05/21/2006  . Chronic systolic CHF (congestive heart failure) ([place]) 05/21/2006  . APNEA, SLEEP 05/21/2006    Past Surgical History:  Procedure Laterality Date  . AMPUTATION  03/16/2011   Procedure: AMPUTATION DIGIT;  Surgeon: Angelia Mould, MD;  Location: [facility] OR;  Service: Vascular;  Laterality: Left;  Great toe  . AMPUTATION Left 11/08/2015   Procedure: AMPUTATION ABOVE KNEE LEFT;  Surgeon: Waynetta Sandy, MD;  Location: [facility];  Service: Vascular;  Laterality: Left;  . CARDIAC CATHETERIZATION  October 18, 2010   Known obstructive disease, no further blockages  . CARDIAC CATHETERIZATION  03/17/2005   EF 35-40%  . COLONOSCOPY  2006    Multiple polyps removed, repeat in 5 years  . CORONARY ARTERY BYPASS GRAFT  1992  . [place], 2002  . DOPPLER ECHOCARDIOGRAPHY  Sept 2011   EF 50-55% with some impaired diastolic relaxation  . FEMORAL-POPLITEAL BYPASS GRAFT  2008   Left  . FRACTURE SURGERY  1980s   Hip  . HIP ARTHROPLASTY Left   . PERIPHERAL VASCULAR CATHETERIZATION N/A 11/06/2015   Procedure: Abdominal Aortogram;  Surgeon: Serafina Mitchell, MD;  Location: [facility] LAB;  Service: Cardiovascular;  Laterality: N/A;  . PERIPHERAL VASCULAR CATHETERIZATION Bilateral 11/06/2015   Procedure: Lower Extremity Angiography;  Surgeon: Serafina Mitchell, MD;  Location: [facility] LAB;  Service: Cardiovascular;  Laterality: Bilateral;  . Right iliopopliteal bypass - [place]  . septic arthritis     Removal of infected CABG wire by Dr Arlyce Dice - 2011  . SPINE SURGERY  2002   Cervical fusion vertebroplasty C3-4-5  . US  ECHOCARDIOGRAPHY  03/04/2006   EF 50-55%       Home Medications    Prior to Admission medications   Medication Sig Start Date End Date Taking? Authorizing Provider  Amino Acids-Protein Hydrolys (FEEDING SUPPLEMENT, PRO-STAT SUGAR FREE 64,) LIQD Take 30 mLs by mouth 2 (two) times daily. Patient taking differently: Take 60 mLs by mouth 3 (three) times daily with meals.  11/10/15   Ripudeep Krystal Eaton, MD  aspirin EC 81 MG EC tablet Take 1 tablet (81 mg total) by mouth daily. 11/10/15   Ripudeep Krystal Eaton, MD  atorvastatin (LIPITOR) 20 MG tablet Take 20 mg by mouth daily.    Historical Provider, MD  bisacodyl (DULCOLAX) 10 MG suppository Place 1 suppository (10 mg total) rectally daily as needed for moderate constipation. 11/10/15   Ripudeep Krystal Eaton, MD  carvedilol (COREG) 3.125 MG tablet Take 1 tablet (3.125 mg total) by mouth 2 (two) times daily with a meal. 10/29/15   Orson Eva, MD  collagenase (SANTYL) ointment Apply 1 application topically daily. Apply Santyl to left  and right heel wounds Q day, then cover with moist gauze and dry kerlex 11/10/15   Ripudeep K Rai, MD  docusate sodium (COLACE) 100 MG capsule Take 100 mg by mouth 2 (two) times daily.    Historical Provider, MD  DULoxetine (CYMBALTA) 30 MG capsule Take 90 mg by mouth daily.    Historical Provider, MD  ferrous sulfate 325 (65 FE) MG tablet Take 1 tablet (325 mg total) by mouth daily with breakfast. 10/29/15   Orson Eva, MD  furosemide (LASIX) 80 MG tablet Take 1 tablet (80 mg total) by mouth 2 (two) times daily. 10/29/15   Orson Eva, MD  gabapentin (NEURONTIN) 400 MG capsule Take 400 mg by mouth 3 (three) times daily.    Historical Provider, MD  guaiFENesin-dextromethorphan (ROBITUSSIN DM) 100-10 MG/5ML syrup Take 15 mLs by mouth every 4 (four) hours as needed for cough. 11/10/15   Ripudeep Krystal Eaton, MD  HYDROcodone-acetaminophen (NORCO) 7.5-325 MG tablet Take 1 tablet by mouth every 4 (four) hours as needed for moderate pain or severe pain. 11/10/15    Ripudeep Krystal Eaton, MD  insulin lispro (HUMALOG) 100 UNIT/ML cartridge Inject into the skin. Inject as per sliding scale: if  0-70=0; 71-149 = 8 units, 150-600= 13 units subcutaneously before meals related to DM.    Historical Provider, MD  insulin NPH Human (HUMULIN N,NOVOLIN N) 100 UNIT/ML injection Inject 0.2 mLs (20 Units total) into the skin 2 (two) times daily before a meal. Patient taking differently: Inject 20 Units into the skin. 20 units before meals 11/10/15   Ripudeep K Rai, MD  lidocaine (LIDODERM) 5 % Place 1 patch onto the skin daily.     Historical Provider, MD  LORazepam (ATIVAN) 0.5 MG tablet Take 1 tablet (0.5 mg total) by mouth 2 (two) times daily as needed for anxiety. 11/10/15   Ripudeep Krystal Eaton, MD  lubiprostone (AMITIZA) 8 MCG capsule Take 8 mcg by mouth 2 (two) times daily with a meal.    Historical Provider, MD  methocarbamol (ROBAXIN) 500 MG tablet Take 1 tablet (500 mg total) by mouth 3 (three) times daily. 11/10/15   Ripudeep Krystal Eaton, MD  Multiple Vitamins-Minerals (DECUBI-VITE PO) Take 1 tablet by mouth daily.     Historical Provider, MD  nitroGLYCERIN (NITROSTAT) 0.4 MG SL tablet Place 1 tablet (0.4 mg total) under the tongue as needed. For chest pains 10/19/13   Alveda Reasons, MD  omeprazole (PRILOSEC) 40 MG capsule Take 1 capsule (40 mg total) by mouth daily. 05/10/14   Alveda Reasons, MD  OXYGEN Inhale 2 L into the lungs as needed (for congestion, cough, and wheezing related to COPD).    Historical Provider, MD  potassium chloride SA (K-DUR,KLOR-CON) 20 MEQ tablet Take 20 mEq by mouth 2 (two) times daily.    Historical Provider, MD  Propylene Glycol (SYSTANE BALANCE) 0.6 % SOLN Place 1 drop into both eyes 2 (two) times daily.     Historical Provider, MD  rivaroxaban (XARELTO) 20 MG TABS tablet Take 20 mg by mouth daily with supper.    Historical Provider, MD  senna-docusate (SENOKOT-S) 8.6-50 MG tablet Take 1 tablet by mouth 2 (two) times daily. 11/10/15   Ripudeep Krystal Eaton, MD    Skin Protectants, Misc. (CALAZIME SKIN PROTECTANT EX) Apply topically. Apply to sacrum and right buttock every day shift    Historical Provider, MD  tamsulosin (FLOMAX) 0.4 MG CAPS capsule Take 1 capsule (0.4 mg total) by mouth daily after supper. 10/19/13  Alveda Reasons, MD  traZODone (DESYREL) 100 MG tablet Take 100 mg by mouth at bedtime.     Historical Provider, MD  Vitamin D, Ergocalciferol, (DRISDOL) 50000 units CAPS capsule Take 1 capsule (50,000 Units total) by mouth every 7 (seven) days. 05/03/15   Gerlene Fee, NP    Family History Family History  Problem Relation Age of Onset  . Heart disease Father   . Heart disease Mother     Social History Social History  Substance Use Topics  . Smoking status: Former Smoker    Quit date: 06/19/2000  . Smokeless tobacco: Never Used  . Alcohol use No     Allergies   Diazepam and Vancomycin   Review of Systems Review of Systems  Unable to perform ROS: Mental status change     Physical Exam Updated Vital Signs SpO2 96%   Physical Exam  Constitutional:  Chronically ill appearing elderly male in bed  HENT:  Mouth is dry  Eyes: EOM are normal. Pupils are equal, round, and reactive to light.  Neck: Normal range of motion. Neck supple. No JVD present.  Cardiovascular:  Tachycardia without murmurs or gallops  Pulmonary/Chest:  Decreased breath sounds without overt wheezes, rales, rhonchi  Abdominal: Soft. There is no tenderness.  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  Skin: There is erythema (R leg: Widespread extensive erythema tracking from his foot up work towards his lower leg and thigh, tender to palpation. Patient has a decubitus ulcer to the posterior right foot with discharge and surrounding erythema. Dorsalis pedis pulse palpable).  Nursing note and vitals reviewed.    ED Treatments / Results  Labs (all labs ordered are listed, but only abnormal results are displayed) Labs Reviewed   COMPREHENSIVE METABOLIC PANEL - Abnormal; Notable for the following:       Result Value   Sodium 131 (*)    Potassium 2.9 (*)    Chloride 91 (*)    Glucose, Bld 181 (*)    Calcium 8.7 (*)    Total Protein 6.2 (*)    Albumin 2.7 (*)    All other components within normal limits  CBC WITH DIFFERENTIAL/PLATELET - Abnormal; Notable for the following:    WBC 35.1 (*)    Hemoglobin 11.5 (*)    HCT 36.2 (*)    MCV 75.9 (*)    MCH 24.1 (*)    RDW 16.9 (*)    Neutro Abs 31.9 (*)    Monocytes Absolute 1.8 (*)    All other components within normal limits  URINE CULTURE  CULTURE, BLOOD (ROUTINE X 2)  CULTURE, BLOOD (ROUTINE X 2) W REFLEX TO ID PANEL  URINALYSIS, ROUTINE W REFLEX MICROSCOPIC (NOT AT [facility])  BASIC METABOLIC PANEL  I-STAT CG4 LACTIC ACID, ED    EKG  EKG Interpretation  Date/Time:  Thursday January 24 2016 16:55:19 EDT Ventricular Rate:  110 PR Interval:    QRS Duration: 161 QT Interval:  403 QTC Calculation: 546 R Axis:   -4 Text Interpretation:  Sinus tachycardia Atrial premature complex Left bundle branch block When compared to ECG on 10/31/15, No significant changes were seen.  NO STEMI Confirmed by Sherry Ruffing MD, [facility] [phone]) on 01/24/2016 5:03:26 PM       Radiology Dg Chest Portable 1 View  Result Date: 01/24/2016 CLINICAL DATA:  Sepsis. EXAM: PORTABLE CHEST 1 VIEW COMPARISON:  10/31/2015 FINDINGS: Mildly degraded exam due to AP portable technique and patient body habitus. Prior median sternotomy. Numerous leads  and wires project over the chest. Apical lordotic positioning. Right glenohumeral joint osteoarthritis. Patient rotated right. Mild cardiomegaly. Left hemidiaphragm elevation. No right and no definite left pleural effusion. No pneumothorax. Low lung volumes with resultant pulmonary interstitial prominence. No overt congestive failure. Mild left base atelectasis is similar. IMPRESSION: No acute process or explanation for sepsis Cardiomegaly with left  hemidiaphragm elevation and adjacent volume loss/atelectasis. Electronically Signed   By: Abigail Miyamoto M.D.   On: 01/24/2016 18:00    Procedures Procedures (including critical care time)  Medications Ordered in ED Medications  sodium chloride 0.9 % bolus 1,000 mL (1,000 mLs Intravenous New Bag/Given 01/24/16 1813)    And  sodium chloride 0.9 % bolus 1,000 mL (not administered)    And  sodium chloride 0.9 % bolus 1,000 mL (not administered)    And  sodium chloride 0.9 % bolus 500 mL (not administered)  piperacillin-tazobactam (ZOSYN) IVPB 3.375 g (3.375 g Intravenous New Bag/Given 01/24/16 1813)  vancomycin (VANCOCIN) 2,000 mg in sodium chloride 0.9 % 500 mL IVPB (not administered)  piperacillin-tazobactam (ZOSYN) IVPB 3.375 g (not administered)  vancomycin (VANCOCIN) 1,250 mg in sodium chloride 0.9 % 250 mL IVPB (not administered)  potassium chloride SA (K-DUR,KLOR-CON) CR tablet 40 mEq (not administered)  acetaminophen (TYLENOL) tablet 650 mg (650 mg Oral Given 01/24/16 1818)     Initial Impression / Assessment and Plan / ED Course  I have reviewed the triage vital signs and the nursing notes.  Pertinent labs & imaging results that were available during my care of the patient were reviewed by me and considered in my medical decision making (see chart for details).  Clinical Course    BP 106/72   Pulse 108   Temp 101.4 F (38.6 C) (Rectal)   Resp 23   Ht 5\' 6"  (1.676 m)   Wt 106.1 kg   SpO2 96%   BMI 37.77 kg/m    Final Clinical Impressions(s) / ED Diagnoses   Final diagnoses:  Cellulitis of right leg  Sepsis affecting skin (HCC)    New Prescriptions New Prescriptions   No medications on file   5:31 PM Patient sent here from his nursing facility for evaluation of fever and right leg cellulitis. He does have history of chronic cellulitis and osteomyelitis. He does have a rectal temp of 101.4, is tachycardic, tachypneic, and hypotensive with a source. Course sepsis  initiated. Care discussed with Dr. Lacinda Axon.   6:32 PM Patient received IV fluid 30 ML per kilogram, as well as vancomycin and Zosyn. He has read and syndrome therefore vancomycin rate was adjusted. Labs remarkable for potassium of 2.9, he would really need replacement. Otherwise lactic acid is within normal range, has elevated white count of 35.1. Chest x-ray without concerning feature. EKG shows an old left bundle branch block. Patient given Tylenol for his fever. Potassium supplementation given as well.  6:47 PM Appreciate consultation from [facility] Medicine resident who agrees to see pt in the ER and will admit to med surg under the care of attending Dr. Gwendlyn Deutscher.    Sepsis - Repeat Assessment  Performed at:    1939  Vitals     Blood pressure (!) 78/42, pulse 83, temperature 101.4 F (38.6 C), temperature source Rectal, resp. rate 18, height 5\' 6"  (1.676 m), weight 106.1 kg, SpO2 100 %.  Heart:     Regular rate and rhythm  Lungs:    CTA  Capillary Refill:   <2 sec  Peripheral Pulse:   Radial pulse  palpable  Skin:     Pale and Diaphoretic   7:39 PM BP decreased on recheck.  Resident notified, will admit to step down, will readjust BP cuff and continue with IVF.   CRITICAL CARE Performed by: Domenic Moras Total critical care time: 45 minutes Critical care time was exclusive of separately billable procedures and treating other patients. Critical care was necessary to treat or prevent imminent or life-threatening deterioration. Critical care was time spent personally by me on the following activities: development of treatment plan with patient and/or surrogate as well as nursing, discussions with consultants, evaluation of patient's response to treatment, examination of patient, obtaining history from patient or surrogate, ordering and performing treatments and interventions, ordering and review of laboratory studies, ordering and review of radiographic studies, pulse oximetry and  re-evaluation of patient's condition.    Domenic Moras, PA-C 01/24/16 1940    [name], MD 01/28/16 2152

## 2016-01-24 NOTE — Progress Notes (Signed)
Went to evaluate patient and he was noted to be septic. Has hypotension that is not responding to fluids. Several weeping and odorous leg wounds. Extensive PMH. Consulted CCM for this critically ill patient. They will admit patient for pressors and fluids; discussed this with wife. He is however a DNR/DNI. Will accept patient back on FPTS once he is stable.   Luiz Blare, DO 01/24/2016, 8:28 PM PGY-3, [place]

## 2016-01-24 NOTE — Progress Notes (Signed)
Pharmacy Antibiotic Note [name] is a 75 y.o. male admitted on 01/24/2016 with right leg cellulitis. Pharmacy has been consulted for Zosyn and vancomycin dosing.  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   Plan: 1. Zosyn 3.375 grams IV every 8 hours (infused over 4 hours) 2. Vancomycin 2000 mg x 1 followed by 1250 mg IV every 12 hours (infuse over 2 hours) 3. If remains on vancomycin will obtain level at [facility]; goal trough 10 - 20 4. SCr every 72 hours while on vancomycin    Height: 5\' 6"  (167.6 cm) Weight: 234 lb (106.1 kg) IBW/kg (Calculated) : 63.8  Temp (24hrs), Avg:100.2 F (37.9 C), Min:98.9 F (37.2 C), Max:101.4 F (38.6 C)   Recent Labs Lab 01/24/16 1709 01/24/16 1719  WBC 35.1*  --   CREATININE 0.99  --   LATICACIDVEN  --  1.90    Estimated Creatinine Clearance: 71.3 mL/min (by C-G formula based on SCr of 0.99 mg/dL).    Allergies  Allergen Reactions  . Diazepam Anxiety    REACTION: makes patient cry  . Vancomycin Other (See Comments)    "red man syndrome"    Antimicrobials this admission:  11/2 Zosyn >>  11/2 Vancomycin >>   Dose adjustments this admission:  N/a  Microbiology results:  11/2 BCx: px 11/2 UCx: px  Thank you for allowing pharmacy to be a part of this patient's care.  Vincenza Hews, PharmD, BCPS 01/24/2016, 6:34 PM Pager: [phone]

## 2016-01-24 NOTE — ED Triage Notes (Signed)
Pt from [facility].  Per staff pt had a sudden onset of redness and pain to right lower leg.  Pt was febrile at facility.  Pt had a left BKA aprox. 2 months ago.  Pedal pulses present to right.  Pt on 2L O2 at all times.

## 2016-01-25 ENCOUNTER — Ambulatory Visit: Payer: Medicare Other | Admitting: Vascular Surgery

## 2016-01-25 LAB — BASIC METABOLIC PANEL
ANION GAP: 15 (ref 5–15)
ANION GAP: 12 (ref 5–15)
BUN: 14 mg/dL (ref 6–20)
BUN: 16 mg/dL (ref 6–20)
CALCIUM: 7.6 mg/dL — AB (ref 8.9–10.3)
CALCIUM: 8.1 mg/dL — AB (ref 8.9–10.3)
CO2: 26 mmol/L (ref 22–32)
CO2: 25 mmol/L (ref 22–32)
CREATININE: 0.92 mg/dL (ref 0.61–1.24)
CREATININE: 0.88 mg/dL (ref 0.61–1.24)
Chloride: 99 mmol/L — ABNORMAL LOW (ref 101–111)
Chloride: 99 mmol/L — ABNORMAL LOW (ref 101–111)
GLUCOSE: 156 mg/dL — AB (ref 65–99)
GLUCOSE: 160 mg/dL — AB (ref 65–99)
Potassium: 3 mmol/L — ABNORMAL LOW (ref 3.5–5.1)
Potassium: 3.1 mmol/L — ABNORMAL LOW (ref 3.5–5.1)
Sodium: 136 mmol/L (ref 135–145)
Sodium: 140 mmol/L (ref 135–145)

## 2016-01-25 LAB — GLUCOSE, CAPILLARY
GLUCOSE-CAPILLARY: 148 mg/dL — AB (ref 65–99)
GLUCOSE-CAPILLARY: 130 mg/dL — AB (ref 65–99)
Glucose-Capillary: 136 mg/dL — ABNORMAL HIGH (ref 65–99)
Glucose-Capillary: 168 mg/dL — ABNORMAL HIGH (ref 65–99)
Glucose-Capillary: 175 mg/dL — ABNORMAL HIGH (ref 65–99)
Glucose-Capillary: 187 mg/dL — ABNORMAL HIGH (ref 65–99)

## 2016-01-25 LAB — CBC
HEMATOCRIT: 34.3 % — AB (ref 39.0–52.0)
Hemoglobin: 10.5 g/dL — ABNORMAL LOW (ref 13.0–17.0)
MCH: 23.5 pg — ABNORMAL LOW (ref 26.0–34.0)
MCHC: 30.6 g/dL (ref 30.0–36.0)
MCV: 76.7 fL — AB (ref 78.0–100.0)
PLATELETS: 228 10*3/uL (ref 150–400)
RBC: 4.47 MIL/uL (ref 4.22–5.81)
RDW: 16.3 % — AB (ref 11.5–15.5)
WBC: 26.4 10*3/uL — AB (ref 4.0–10.5)

## 2016-01-25 LAB — URINE CULTURE: CULTURE: NO GROWTH

## 2016-01-25 LAB — APTT
APTT: 56 s — AB (ref 24–36)
APTT: 73 s — AB (ref 24–36)
aPTT: 45 seconds — ABNORMAL HIGH (ref 24–36)
aPTT: 54 seconds — ABNORMAL HIGH (ref 24–36)

## 2016-01-25 LAB — PROTIME-INR
INR: 1.58
Prothrombin Time: 19.1 seconds — ABNORMAL HIGH (ref 11.4–15.2)

## 2016-01-25 LAB — MRSA PCR SCREENING: MRSA by PCR: POSITIVE — AB

## 2016-01-25 LAB — MAGNESIUM: Magnesium: 1.6 mg/dL — ABNORMAL LOW (ref 1.7–2.4)

## 2016-01-25 LAB — HEPARIN LEVEL (UNFRACTIONATED): HEPARIN UNFRACTIONATED: 1.05 [IU]/mL — AB (ref 0.30–0.70)

## 2016-01-25 LAB — PROCALCITONIN: PROCALCITONIN: 0.75 ng/mL

## 2016-01-25 LAB — PHOSPHORUS: Phosphorus: 6.3 mg/dL — ABNORMAL HIGH (ref 2.5–4.6)

## 2016-01-25 MED ORDER — MAGNESIUM SULFATE 2 GM/50ML IV SOLN
2.0000 g | Freq: Once | INTRAVENOUS | Status: AC
  Administered 2016-01-25: 2 g via INTRAVENOUS
  Filled 2016-01-25: qty 50

## 2016-01-25 MED ORDER — ACETAMINOPHEN 325 MG PO TABS
650.0000 mg | ORAL_TABLET | Freq: Four times a day (QID) | ORAL | Status: DC | PRN
  Administered 2016-01-25 – 2016-01-28 (×4): 650 mg via ORAL
  Filled 2016-01-25 (×4): qty 2

## 2016-01-25 MED ORDER — CHLORHEXIDINE GLUCONATE CLOTH 2 % EX PADS
6.0000 | MEDICATED_PAD | Freq: Every day | CUTANEOUS | Status: AC
  Administered 2016-01-25 – 2016-01-29 (×5): 6 via TOPICAL

## 2016-01-25 MED ORDER — SODIUM CHLORIDE 0.9% FLUSH: 10.0000 mL | INTRAVENOUS | Status: DC | PRN

## 2016-01-25 MED ORDER — SODIUM CHLORIDE 0.9 % IV BOLUS (SEPSIS)
500.0000 mL | Freq: Once | INTRAVENOUS | Status: AC
  Administered 2016-01-25: 500 mL via INTRAVENOUS

## 2016-01-25 MED ORDER — SODIUM CHLORIDE 0.9% FLUSH
10.0000 mL | Freq: Two times a day (BID) | INTRAVENOUS | Status: DC
  Administered 2016-01-25 – 2016-01-26 (×2): 10 mL via INTRAVENOUS

## 2016-01-25 MED ORDER — POTASSIUM CHLORIDE 10 MEQ/100ML IV SOLN
10.0000 meq | INTRAVENOUS | Status: DC
  Administered 2016-01-25 (×3): 10 meq via INTRAVENOUS
  Filled 2016-01-25 (×3): qty 100

## 2016-01-25 MED ORDER — MUPIROCIN 2 % EX OINT
1.0000 "application " | TOPICAL_OINTMENT | Freq: Two times a day (BID) | CUTANEOUS | Status: DC
  Administered 2016-01-25 – 2016-01-29 (×9): 1 via NASAL
  Filled 2016-01-25 (×2): qty 22

## 2016-01-25 MED ORDER — POTASSIUM CHLORIDE CRYS ER 20 MEQ PO TBCR
40.0000 meq | EXTENDED_RELEASE_TABLET | Freq: Once | ORAL | Status: AC
  Administered 2016-01-25: 40 meq via ORAL
  Filled 2016-01-25: qty 2

## 2016-01-25 MED ORDER — SODIUM CHLORIDE 0.9% FLUSH: 10.0000 mL | Freq: Two times a day (BID) | INTRAVENOUS | Status: DC

## 2016-01-25 MED ORDER — POTASSIUM CHLORIDE CRYS ER 20 MEQ PO TBCR
40.0000 meq | EXTENDED_RELEASE_TABLET | Freq: Once | ORAL | Status: AC
Start: 2016-01-25 — End: 2016-01-25
  Administered 2016-01-25: 40 meq via ORAL
  Filled 2016-01-25: qty 2

## 2016-01-25 MED ORDER — ORAL CARE MOUTH RINSE: 15.0000 mL | Freq: Two times a day (BID) | OROMUCOSAL | Status: DC

## 2016-01-25 MED ORDER — ORAL CARE MOUTH RINSE: 15.0000 mL | OROMUCOSAL | Status: DC | PRN

## 2016-01-25 NOTE — Consult Note (Signed)
[facility] Nurse wound consult note Reason for Consult: L stump and RLE  Wound type: Unstageable pressure injury right lateral heel;4cm x 4.5x 0.5 Cellulitis right LE Non healing, full thickness wounds to the left stump wound Left Lateral-2cmx 7cm with undermining at 3 o'clock 2.5cm  Left Medial-4cm x 5cm x 1.5cm  Left Two tunneling areas medial inner thigh; proximal 1.0cm deep; medial 2.5cm deep Pressure Ulcer POA: Yes Measurement: see above  Wound bed: Right lateral heel: 75% yellow/25% pink Left lateral stump wound: 80% pink/20% yellow slough Left medial stump wound: dry, pale, non granular Drainage (amount, consistency, odor) moderate,serosanginous from the stump wounds; moderate yellow with odor right heel wound Periwound:intact  Dressing procedure/placement/frequency: VVS surgeon recently assessed wounds and has written orders for wet to dry for mechanical debridement.  Will add hydrotherapy to attempt to clean wounds up for possible NPWT placement.  Will add Prevalon boot for offloading right heel.  [facility] Nurse team will follow along with you for weekly wound assessments.  Please notify me of any acute changes in the wounds or any new areas of concerns [place] MSN, [place], CNS [phone]

## 2016-01-25 NOTE — Progress Notes (Signed)
[facility] for heparin Indication: history of PE  Allergies  Allergen Reactions  . Diazepam Anxiety    REACTION: makes patient cry  . Vancomycin Other (See Comments)    "red man syndrome"    Patient Measurements: Height: 5\' 3"  (160 cm) Weight: 235 lb 3.7 oz (106.7 kg) IBW/kg (Calculated) : 56.9 Heparin Dosing Weight: 88 kg  Vital Signs: Temp: 98 ?F (36.7 ?C) (11/03 0825) Temp Source: Oral (11/03 0825) BP: 105/54 (11/03 0900) Pulse Rate: 81 (11/03 0900)  Labs:  Recent Labs  01/24/16 1709 01/24/16 2330 01/25/16 0254 01/25/16 0759  HGB 11.5*  --  10.5*  --   HCT 36.2*  --  34.3*  --   PLT 288  --  228  --   APTT  --  45* 54* 56*  LABPROT  --  19.1*  --   --   INR  --  1.58  --   --   HEPARINUNFRC  --  1.05*  --   --   CREATININE 0.99 0.88 0.92  --     Estimated Creatinine Clearance: 73 mL/min (by C-G formula based on SCr of 0.92 mg/dL).   Medical History: Past Medical History:  Diagnosis Date  . Adhesive capsulitis   . Angina   . Anxiety   . CAD (coronary artery disease)   . CHF (congestive heart failure) ([place])   . Chronic back pain   . Chronic kidney disease    hx of BPH  . COPD (chronic obstructive pulmonary disease) ([place])   . CVA (cerebral infarction) Questionable history  . Depression   . Diabetes mellitus   . Diverticulosis   . Falls frequently 06/2014  . GERD (gastroesophageal reflux disease)   . Hyperlipidemia   . Hypertension   . 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 Myocardial infarction   . Neuromuscular disorder ([place])    HX of diabetic periferal neuropathy  . On home oxygen therapy    "1.5L prn" (04/12/2014)  . Osteomyelitis ([place]) 2012   Sternoclavicular joint   . PE (pulmonary embolism) 10-15 years ago   Lifelong Coumadin  . Peripheral vascular disease ([place])   . Shortness of breath   . Sleep apnea     Assessment: 75 yo male on Xarelto PTA for history  of PE admitted 11/2 with septic shock 2/2 to RLE cellulitis.  Xarelto on hold and pharmacy to assist with transitioning to heparin infusion.  Last dose of Xarelto on 11/1 at unknown time.  CBC stable. APTT level subtherapeutic 56 at 1600 units/hr. No signs of bleeding or complications with infusion.    Goal of Therapy:  Heparin level 0.3-0.7 units/ml aPTT 66-102 seconds Monitor platelets by anticoagulation protocol: Yes   Plan:  1. Increase heparin infusion to 1800 units/hr 2. Monitor platelets 3. Obtain 8 hr aPTT 4. Continue daily heparin level and aPTT until levels correlate  Imagene Riches 01/25/2016, 10:08 AM

## 2016-01-25 NOTE — Progress Notes (Signed)
Peripherally Inserted Central Catheter/Midline Placement  The IV Nurse has discussed with the patient and/or persons authorized to consent for the patient, the purpose of this procedure and the potential benefits and risks involved with this procedure.  The benefits include less needle sticks, lab draws from the catheter, and the patient may be discharged home with the catheter. Risks include, but not limited to, infection, bleeding, blood clot (thrombus formation), and puncture of an artery; nerve damage and irregular heartbeat and possibility to perform a PICC exchange if needed/ordered by physician.  Alternatives to this procedure were also discussed.  B[facility] [employer] PICC patient education guide, fact sheet on infection prevention and patient information card has been provided to patient /or left at bedside.    PICC/Midline Placement Documentation        Henderson [place] 01/25/2016, 2:28 PM Consent obtained by Claretha Cooper, RN

## 2016-01-25 NOTE — Progress Notes (Signed)
   VASCULAR SURGERY ASSESSMENT & PLAN:  This patient is well-known to our practice. Most recently, he underwent a left above-the-knee amputation by Dr. Donzetta Matters on 11/08/2015. He was last seen in the office on 12/21/2015. At that time he had a 4 cm x 6 cm defect in the center of his amputation site with a fairly clean base and no significant drainage. He also has a known 1 cm ulcer on his right lateral heel.  Of note, arteriography on 11/06/2015 showed that on the right side there was in-line flow from the groin to the ankle. There was two-vessel runoff on the right via the anterior tibial and peroneal arteries.  The patient was admitted to the hospital with lethargy and we were asked to evaluate his left AKA. I have written for dressing changes to the left above-the-knee amputation wounds. Currently I do not think the wounds are clean enough to place a negative pressure dressing. However, if they show some improvement with aggressive local wound care, then he may benefit from a negative pressure dressing.  With respect to the wound on his right heel I would continue aggressive wound care. Based on his arteriogram from 11/06/2015, he has no options for further revascularization but does have reasonable blood flow to the right foot via the anterior tibial and peroneal arteries.   I do not think either of these wounds currently are a source of sepsis. He is currently on Zosyn and Vancomycin.  SUBJECTIVE: No complaints  PHYSICAL EXAM: Vitals:   01/25/16 0800 01/25/16 0825 01/25/16 0900 01/25/16 1154  BP: (!) 84/52  (!) 105/54   Pulse: 80  81   Resp: (!) 25  (!) 24   Temp:  98 F (36.7 C)  97 F (36.1 C)  TempSrc:  Oral  Oral  SpO2: 94%  98%   Weight:      Height:       LEFT AKA: He has a large wound on the medial aspect of his right AKA with no significant drainage. The tissue looks marginally well perfused. There is also a wound on the lateral aspect of the AKA with some devitalized  tissue.  He also has a wound on his right heel on the lateral aspect.  LABS: Lab Results  Component Value Date   WBC 26.4 (H) 01/25/2016   HGB 10.5 (L) 01/25/2016   HCT 34.3 (L) 01/25/2016   MCV 76.7 (L) 01/25/2016   PLT 228 01/25/2016   Lab Results  Component Value Date   CREATININE 0.92 01/25/2016   Lab Results  Component Value Date   INR 1.58 01/24/2016   PROTIME 19.9 (A) 08/13/2015   CBG (last 3)   Recent Labs  01/25/16 0344 01/25/16 0822 01/25/16 1152  GLUCAP 136* 168* 175*    Active Problems:   Septic shock ([place])   Cellulitis of right leg    Gae Gallop Beeper: [id number] 01/25/2016

## 2016-01-25 NOTE — Progress Notes (Signed)
PULMONARY / CRITICAL CARE MEDICINE   Name: [name] MRN: [id number] DOB: [birth year]    ADMISSION DATE:  01/24/2016 CONSULTATION DATE:  01/24/2016  REFERRING MD:  Dr. 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 He resides in SNF. He was in his usual state of health until the morning of 11/2 when he was noted to have fevers by nursing homes staff. On their evaluation he was found to have erythema to his right lower extrimity concerning for cellulitis. He was transported to the emergency department for further evaluation. In the ER he was also hypotensive code sepsis was initiated. He was started on broad-spectrum antibiotics and IV fluid resuscitation, however, systolic blood pressure remained low in the 80s. There was concern that he would require vasopressors and ICU admission, therefore PCCM asked to admit.  Events: Imaging 10/2 CXR: Cardiomegaly with left hemidiaphragm elevation and adjacent volume loss/atelectasis.    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   CULTURES: Blood & Urine 11/2 > MRSA PCR +ve  ANTIBIOTICS: Zosyn 11/2 > Vancomycin 11/2 >  LINES/TUBES:  SIGNIFICANT EVENTS: 10/2015 > admit for osteo with L AKA  11/02: admitted to ICU due to cellulitis septic shock. BSA, levo, stress dose steroid  SUBJECTIVE:  Reports right leg pain, left stump pain and headache. Headache the same as his usual headache. Denies chest pain or shortness of breath. He states that his wife was her this morning.   REVIEW OF SYSTEMS:   Admits headache Denies photophobia or vision chanes Denies numbness or tingling Denies abdominal pain  VITAL SIGNS: BP (!) 105/59   Pulse 81   Temp 98.7 F  (37.1 C) (Axillary)   Resp 20   Ht 5\' 3"  (1.6 m)   Wt 106.7 kg (235 lb 3.7 oz)   SpO2 95%   BMI 41.67 kg/m   HEMODYNAMICS: On levophed drip KVO  VENTILATOR SETTINGS: On home oxygen, 2 by [place]  INTAKE / OUTPUT: I/O last 3 completed shifts: In: 4582.1 [I.V.:282.1; IV [id number] Out: [id number] [Urine:1275]  PHYSICAL EXAMINATION: General:  Morbidly obese male Neuro:  Alert and awake, oriented to self, person and place HEENT:  [place]/AT, PERRL, no JVD Cardiovascular:  RRR, no MRG, DP pulse 2+ on right leg Lungs:  Clear, distant, no crackles antriorly Abdomen:  Soft, non-tender, non-distended, obese Musculoskeletal: Recent L AKA with poorly healing wounds on stump Skin:  Erythema and warmth to RLE from his ankle to his knee. Erythema didn't seem to be expanding.   LABS:  BMET  Recent Labs Lab 01/24/16 1709 01/24/16 2330 01/25/16 0254  NA 131* 136 140  K 2.9* 3.1* 3.0*  CL 91* 99* 99*  CO2 29 25 26   BUN 17 16 14   CREATININE 0.99 0.88 0.92  GLUCOSE 181* 160* 156*    Electrolytes  Recent Labs Lab 01/24/16 1709 01/24/16 2330 01/25/16 0254  CALCIUM 8.7* 8.1* 7.6*  MG  --   --  1.6*  PHOS  --   --  6.3*    CBC  Recent Labs Lab 01/24/16 1709 01/25/16 0254  WBC 35.1* 26.4*  HGB 11.5* 10.5*  HCT 36.2* 34.3*  PLT 288 228    Coag's  Recent Labs Lab 01/24/16 2330 01/25/16 0254  APTT 45* 54*  INR 1.58  --     Sepsis Markers  Recent Labs Lab 01/24/16 1719 01/24/16 2036  LATICACIDVEN 1.90 1.57    ABG No results for input(s): PHART, PCO2ART, PO2ART in the last 168 hours.  Liver Enzymes  Recent Labs Lab 01/24/16 1709  AST 21  ALT 18  ALKPHOS 81  BILITOT 0.9  ALBUMIN 2.7*    Cardiac Enzymes No results for input(s): TROPONINI, PROBNP in the last 168 hours.  Glucose  Recent Labs Lab 01/24/16 2311 01/25/16 0344  GLUCAP 158* 136*    ASSESSMENT / PLAN:  PULMONARY A: COPD without acute exacerbation At risk volume overload OSA  unclear CPAP  P:   Continue home O2 IS as able KVO fluids s/p 4L in ED Monitor DNI ABG if MS worsens.  CARDIOVASCULAR A:  Septic shock secondary to RLE cellulitis. HFrEF. Echo on 10/22/2015: 30-35% G2DD, PAPP 88mmHg CAD  P:  Telemetry monitoring Hypotensive but lactic and creat wnl Continue levo Stress dose steroids Trend troponin.  No need repeat lactic unless clinically worsens.  Echo from august  RENAL A:   Hypokalemia - repleting Hypomagnesemia  P:   Repeat BMP in AM Replace electrolytes as indicated  GASTROINTESTINAL A:   GERD  P:   ADAT Continue pepcid  HEMATOLOGIC A:   Chronic PE on Xarelto  P:  Heparin per pharmacy Hold xarelto  INFECTIOUS A:   Septic shock secondary to cellulitis Poorly healing wounds L stump  P:   ABX as above Wound care consult Follow cultures Xray R leg to assess free air, may need osteo eval given history.  ENDOCRINE A:   DM   P:   CBG monitoring and SSI  NEUROLOGIC A:   Acute metabolic encephalopathy  P:   RASS goal: 0 Monitor   FAMILY  - Updates: Could not reach wife, but FPTS did and relayed info to me.  - Inter-disciplinary family meet or Palliative Care meeting due by:  11/9   Georgann Housekeeper, AGACNP-BC [place] [facility]/Critical Care Pager [phone] or [phone]  01/25/2016 7:11 AM  Wendee Beavers, MD. FM resident, PGY-2 [phone]

## 2016-01-25 NOTE — Care Management Note (Signed)
Case Management Note  Patient Details  Name: [name] MRN: [id number] Date of Birth: [birth year]  Subjective/Objective:    Pt admitted with cellulitus   - requiring pressor             Action/Plan:   PTA from [place] consulted   Expected Discharge Date:                  Expected Discharge Plan:  [place]  In-House Referral:  Clinical Social Work  Discharge planning Services  CM Consult  Post Acute Care Choice:    Choice offered to:     DME Arranged:    DME Agency:     HH Arranged:    [place] Agency:     Status of Service:  In process, will continue to follow  If discussed at Long Length of Stay Meetings, dates discussed:    Additional Comments:  [name], RN 01/25/2016, 3:51 PM

## 2016-01-25 NOTE — Progress Notes (Signed)
FPTS Social Progress Note  S: Patient accompanied by wife and step-daughter doing well this morning. Wife feeding patient breakfast, tolerating well. Denies fever, chills, nausea, CP, SOB, abdominal pain.  O: BP (!) 105/54   Pulse 81   Temp 98 F (36.7 C) (Oral)   Resp (!) 24   Ht 5\' 3"  (1.6 m)   Wt 235 lb 3.7 oz (106.7 kg)   SpO2 98%   BMI 41.67 kg/m     A/P: Hemodynamically stable. CCM continue care of this patient and FPTS following.  Appreciate the excellent care being provided.  Grill Bing, DO 01/25/2016, 9:42 AM PGY-1, [place] Medicine Service pager [phone]

## 2016-01-25 NOTE — Progress Notes (Signed)
[place] for heparin Indication: history of PE  Allergies  Allergen Reactions  . Diazepam Anxiety    REACTION: makes patient cry  . Vancomycin Other (See Comments)    "red man syndrome"    Patient Measurements: Height: 5\' 3"  (160 cm) Weight: 235 lb 3.7 oz (106.7 kg) IBW/kg (Calculated) : 56.9 Heparin Dosing Weight: 88 kg  Vital Signs: Temp: 97.3 F (36.3 C) (11/03 2008) Temp Source: Oral (11/03 2008) BP: 142/109 (11/03 2100) Pulse Rate: 117 (11/03 2100)  Labs:  Recent Labs  01/24/16 1709  01/24/16 2330 01/25/16 0254 01/25/16 0759 01/25/16 2032  HGB 11.5*  --   --  10.5*  --   --   HCT 36.2*  --   --  34.3*  --   --   PLT 288  --   --  228  --   --   APTT  --   < > 45* 54* 56* 73*  LABPROT  --   --  19.1*  --   --   --   INR  --   --  1.58  --   --   --   HEPARINUNFRC  --   --  1.05*  --   --   --   CREATININE 0.99  --  0.88 0.92  --   --   < > = values in this interval not displayed.  Estimated Creatinine Clearance: 73 mL/min (by C-G formula based on SCr of 0.92 mg/dL).   Medical History: Past Medical History:  Diagnosis Date  . Adhesive capsulitis   . Angina   . Anxiety   . CAD (coronary artery disease)   . CHF (congestive heart failure) ([place])   . Chronic back pain   . Chronic kidney disease    hx of BPH  . COPD (chronic obstructive pulmonary disease) ([place])   . CVA (cerebral infarction) Questionable history  . Depression   . Diabetes mellitus   . Diverticulosis   . Falls frequently 06/2014  . GERD (gastroesophageal reflux disease)   . Hyperlipidemia   . Hypertension   . 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 Myocardial infarction   . Neuromuscular disorder ([place])    HX of diabetic periferal neuropathy  . On home oxygen therapy    "1.5L prn" (04/12/2014)  . Osteomyelitis ([place]) 2012   Sternoclavicular joint   . PE (pulmonary embolism) 10-15 years ago   [facility]  Coumadin  . Peripheral vascular disease ([place])   . Shortness of breath   . Sleep apnea     Assessment: 75 yo male on Xarelto PTA for history of PE, currently on hold and continuing on heparin infusion. Last dose of Xarelto on 11/1 at unknown time. Following aPTTs while Xarelto affecting heparin levels. APTT therapeutic (73) x 1 on rate increase to 1800 units/h. Hg down 10.5, plt wnl. No bleed documented.  Goal of Therapy:  Heparin level 0.3-0.7 units/ml aPTT 66-102 seconds Monitor platelets by anticoagulation protocol: Yes   Plan:  Continue heparin at 1800 units/h 8h aPTT to confirm Daily heparin level/aPTT/CBC Monitor for s/sx bleeding Xarelto on hold   Elicia Lamp, PharmD, [facility] Clinical Pharmacist Pager [phone] 01/25/2016 9:13 PM

## 2016-01-26 ENCOUNTER — Inpatient Hospital Stay (HOSPITAL_COMMUNITY): Payer: Medicare Other

## 2016-01-26 LAB — CBC
HCT: 37.4 % — ABNORMAL LOW (ref 39.0–52.0)
HEMOGLOBIN: 11.5 g/dL — AB (ref 13.0–17.0)
MCH: 23.8 pg — AB (ref 26.0–34.0)
MCHC: 30.7 g/dL (ref 30.0–36.0)
MCV: 77.4 fL — ABNORMAL LOW (ref 78.0–100.0)
Platelets: 313 10*3/uL (ref 150–400)
RBC: 4.83 MIL/uL (ref 4.22–5.81)
RDW: 16.7 % — AB (ref 11.5–15.5)
WBC: 31.6 10*3/uL — ABNORMAL HIGH (ref 4.0–10.5)

## 2016-01-26 LAB — BASIC METABOLIC PANEL
ANION GAP: 10 (ref 5–15)
Anion gap: 9 (ref 5–15)
BUN: 14 mg/dL (ref 6–20)
BUN: 13 mg/dL (ref 6–20)
CALCIUM: 8.5 mg/dL — AB (ref 8.9–10.3)
CO2: 26 mmol/L (ref 22–32)
CO2: 25 mmol/L (ref 22–32)
CREATININE: 0.74 mg/dL (ref 0.61–1.24)
CREATININE: 0.64 mg/dL (ref 0.61–1.24)
Calcium: 8.8 mg/dL — ABNORMAL LOW (ref 8.9–10.3)
Chloride: 103 mmol/L (ref 101–111)
Chloride: 102 mmol/L (ref 101–111)
GFR calc Af Amer: >60 mL/min (ref >60)
GLUCOSE: 185 mg/dL — AB (ref 65–99)
Glucose, Bld: 155 mg/dL — ABNORMAL HIGH (ref 65–99)
POTASSIUM: 4.8 mmol/L (ref 3.5–5.1)
Potassium: 4.5 mmol/L (ref 3.5–5.1)
SODIUM: 137 mmol/L (ref 135–145)
Sodium: 138 mmol/L (ref 135–145)

## 2016-01-26 LAB — GLUCOSE, CAPILLARY
GLUCOSE-CAPILLARY: 167 mg/dL — AB (ref 65–99)
GLUCOSE-CAPILLARY: 190 mg/dL — AB (ref 65–99)
GLUCOSE-CAPILLARY: 161 mg/dL — AB (ref 65–99)
GLUCOSE-CAPILLARY: 167 mg/dL — AB (ref 65–99)
Glucose-Capillary: 160 mg/dL — ABNORMAL HIGH (ref 65–99)

## 2016-01-26 LAB — MAGNESIUM: MAGNESIUM: 2.3 mg/dL (ref 1.7–2.4)

## 2016-01-26 LAB — PHOSPHORUS: PHOSPHORUS: 3.3 mg/dL (ref 2.5–4.6)

## 2016-01-26 LAB — HEPARIN LEVEL (UNFRACTIONATED): HEPARIN UNFRACTIONATED: 0.33 [IU]/mL (ref 0.30–0.70)

## 2016-01-26 LAB — APTT
APTT: 64 s — AB (ref 24–36)
aPTT: 71 seconds — ABNORMAL HIGH (ref 24–36)

## 2016-01-26 LAB — PROCALCITONIN: Procalcitonin: 0.56 ng/mL

## 2016-01-26 MED ORDER — LORAZEPAM 0.5 MG PO TABS: 0.5000 mg | ORAL_TABLET | Freq: Two times a day (BID) | ORAL | Status: DC

## 2016-01-26 MED ORDER — INSULIN ASPART 100 UNIT/ML ~~LOC~~ SOLN: 0.0000 [IU] | Freq: Every day | SUBCUTANEOUS | Status: DC

## 2016-01-26 MED ORDER — FENTANYL CITRATE (PF) 100 MCG/2ML IJ SOLN
INTRAMUSCULAR | Status: AC
  Filled 2016-01-26: qty 2

## 2016-01-26 MED ORDER — INSULIN ASPART 100 UNIT/ML ~~LOC~~ SOLN
0.0000 [IU] | Freq: Three times a day (TID) | SUBCUTANEOUS | Status: DC
  Administered 2016-01-26 – 2016-01-28 (×6): 3 [IU] via SUBCUTANEOUS
  Administered 2016-01-28 – 2016-01-29 (×3): 5 [IU] via SUBCUTANEOUS
  Administered 2016-01-29: 2 [IU] via SUBCUTANEOUS
  Administered 2016-01-29: 3 [IU] via SUBCUTANEOUS

## 2016-01-26 MED ORDER — FAMOTIDINE 20 MG PO TABS
20.0000 mg | ORAL_TABLET | Freq: Two times a day (BID) | ORAL | Status: DC
  Administered 2016-01-26 – 2016-01-29 (×6): 20 mg via ORAL
  Filled 2016-01-26 (×6): qty 1

## 2016-01-26 MED ORDER — FENTANYL CITRATE (PF) 100 MCG/2ML IJ SOLN
25.0000 ug | INTRAMUSCULAR | Status: DC | PRN
  Administered 2016-01-26 – 2016-01-28 (×9): 50 ug via INTRAVENOUS
  Filled 2016-01-26 (×8): qty 2

## 2016-01-26 NOTE — Progress Notes (Signed)
FM notified to resume care on 11/5. Talked to [employer] Field seismologist)

## 2016-01-26 NOTE — Clinical Social Work Note (Signed)
Clinical Social Work Assessment  Patient Details  Name: [name] MRN: [id number] Date of Birth: Sep 17, 1940  Date of referral:  01/26/16               Reason for consult:  Facility Placement                Permission sought to share information with:  Family Supports Permission granted to share information::  Yes, Verbal Permission Granted  Name::     Chartered certified accountant::     Relationship::  Spouse  Contact Information:     Housing/Transportation Living arrangements for the past 2 months:  Ages of Information:  Patient, Spouse Patient Interpreter Needed:  None Criminal Activity/Legal Involvement Pertinent to Current Situation/Hospitalization:  No - Comment as needed Significant Relationships:  Spouse Lives with:  Facility Resident Do you feel safe going back to the place where you live?  Yes Need for family participation in patient care:  Yes (Comment)  Care giving concerns:  Pt is a SNF resident.   Social Worker assessment / plan:  CSW received referral due to pt being a resident at [facility] for the past 16 months. Pt is agreeable to return to SNF when medically stable and provided permission for CSW to contact wife. CSW spoke with wife via phone who reports she visited pt last night and would like for him to return to SNF.  FL2 completed and sent to SNF.   Employment status:  Disabled (Comment on whether or not currently receiving Disability) Insurance information:  Programmer, applications, Medicaid In [place] PT Recommendations:  Not assessed at this time Information / Referral to community resources:  [place]  Patient/Family's Response to care:  Pt was alert and oriented during session.  Patient/Family's Understanding of and Emotional Response to Diagnosis, Current Treatment, and Prognosis:  Pt and wife understanding of treatment plan and agreeable to SNF at DC.  Emotional Assessment Appearance:  Appears older than stated age,  Disheveled Attitude/Demeanor/Rapport:  Other (Appropriate) Affect (typically observed):  Calm Orientation:  Oriented to Self, Oriented to Place, Oriented to  Time, Oriented to Situation Alcohol / Substance use:  Not Applicable Psych involvement (Current and /or in the community):  No (Comment)  Discharge Needs  Concerns to be addressed:  No discharge needs identified Readmission within the last 30 days:  Yes Current discharge risk:  None Barriers to Discharge:  No Barriers Identified   Boone Master, [place] 01/26/2016, 11:26 AM Weekend Coverage

## 2016-01-26 NOTE — Progress Notes (Signed)
Results for TOMIE, ELKO (MRN [id number]) as of 01/26/2016 00:39  Ref. Range 01/25/2016 18:00  Sodium Latest Ref Range: 135 - 145 mmol/L 138  Potassium Latest Ref Range: 3.5 - 5.1 mmol/L 4.8  Chloride Latest Ref Range: 101 - 111 mmol/L 103  CO2 Latest Ref Range: 22 - 32 mmol/L 26  BUN Latest Ref Range: 6 - 20 mg/dL 14  Creatinine Latest Ref Range: 0.61 - 1.24 mg/dL 0.74  Calcium Latest Ref Range: 8.9 - 10.3 mg/dL 8.8 (L)  EGFR (Non-African Amer.) Latest Ref Range: >60 mL/min >60  EGFR (African American) Latest Ref Range: >60 mL/min >60  Glucose Latest Ref Range: 65 - 99 mg/dL 185 (H)  Anion gap Latest Ref Range: 5 - 15  9   Above labs were time stamped for 18:00 on 11/3 under results review tab; actual sample drawn at 23:00 on 11/3.

## 2016-01-26 NOTE — Progress Notes (Signed)
Received report from Rn; patient has been transferred to unit via bed.  Telemetry and continuous pulse oximetry placed per orders.  Patient denies complaints at this time. O2 at 2L with sats at 93%.  CCMD notified of tele.

## 2016-01-26 NOTE — NC FL2 (Signed)
[place] MEDICAID FL2 LEVEL OF CARE SCREENING TOOL     IDENTIFICATION  Patient Name: [name] Birthdate: 02/12/1941 Sex: male Admission Date (Current Location): 01/24/2016  [facility] and [place] Number:  Herbalist and Address:  [facility] [place], [place] [address] [place], [place] [address]      Provider Number: [id number]  Attending Physician Name and Address:  Brand Males, MD  Relative Name and Phone Number:       Current Level of Care: Hospital Recommended Level of Care: [place] Prior Approval Number:    Date Approved/Denied:   PASRR Number: [id number]  Discharge Plan: SNF    Current Diagnoses: Patient Active Problem List   Diagnosis Date Noted  . Cellulitis of right leg 01/24/2016  . Panlobular emphysema ([place])   . Chronic osteomyelitis of left foot ([place])   . Acute renal failure ([place])   . Gangrene ([place])   . Uncontrolled type 2 diabetes mellitus with complication ([place])   . Septic shock ([place]) 10/31/2015  . AKI (acute kidney injury) ([place]) 10/31/2015  . Diabetic ulcer of left foot ([place]) 10/31/2015  . Ulcer of foot due to diabetes ([place]) 10/31/2015  . Hypokalemia   . Pressure ulcer 10/24/2015  . Dyspnea   . Acute on chronic congestive heart failure ([place])   . Acute on chronic systolic (congestive) heart failure 10/19/2015  . Acute on chronic respiratory failure with hypoxia ([place]) 10/19/2015  . Microcytic anemia 10/19/2015  . Atherosclerosis of native arteries of the extremities with ulceration ([place]) 08/03/2015  . Low back pain with sciatica 07/30/2015  . Chronic pulmonary embolism ([place]) 06/18/2015  . Diabetic peripheral neuropathy associated with type 2 diabetes mellitus ([place]) 06/18/2015  . Allergic rhinitis 05/22/2015  . Cellulitis of left lower extremity 05/22/2015  . Venous ulcer of left leg ([place]) 05/22/2015  . Constipation due to opioid therapy 05/06/2015  . Vitamin D deficiency 05/03/2015  . Type 2  diabetes mellitus without complication ([place]) [id number]  . Diabetic osteomyelitis ([place]) 02/15/2015  . Bilateral edema of lower extremity   . Dehydration 07/18/2014  . Diarrhea 07/18/2014  . Benign paroxysmal positional vertigo 05/26/2014  . OA (osteoarthritis) 05/12/2014  . Unsteady gait 04/12/2014  . Orthostatic hypotension 05/16/2013  . Generalized weakness 04/30/2013  . At high risk for falls 07/19/2012  . Falls 02/17/2012  . Bradycardia 01/22/2012  . Hemorrhoid 01/12/2012  . GERD (gastroesophageal reflux disease) 11/10/2011  . Leg edema 09/20/2011  . Peripheral vascular disease ([place]) 04/16/2011  . Osteomyelitis ([place]) 03/13/2011  . Chronic obstructive pulmonary disease ([place]) 02/27/2011  . Stasis dermatitis 12/04/2010  . BPH (benign prostatic hyperplasia) 11/01/2010  . Long term current use of anticoagulant therapy 05/03/2010  . FROZEN RIGHT SHOULDER 02/25/2010  . Bilateral shoulder pain 11/19/2009  . Depression 07/08/2006  . Essential hypertension 07/08/2006  . HYPERCHOLESTEROLEMIA 05/21/2006  . Anxiety state 05/21/2006  . Coronary atherosclerosis 05/21/2006  . Chronic systolic CHF (congestive heart failure) ([place]) 05/21/2006  . APNEA, SLEEP 05/21/2006    Orientation RESPIRATION BLADDER Height & Weight     Self, Time, Situation, Place  O2 (2L) Incontinent, Indwelling catheter Weight: 234 lb 12.6 oz (106.5 kg) Height:  5\' 3"  (160 cm)  BEHAVIORAL SYMPTOMS/MOOD NEUROLOGICAL BOWEL NUTRITION STATUS      Incontinent Diet (Heart Healthy/Carb Modified )  AMBULATORY STATUS COMMUNICATION OF NEEDS Skin   Extensive Assist Verbally PU Stage and Appropriate Care PU Stage 1 Dressing:  (VVS surgeon recently assessed wounds and has written  orders for wet to dry for mechanical debridement.  Will add hydrotherapy to attempt to clean wounds up for possible NPWT placement.  Will add Prevalon boot for offloading right heel.)                     Personal Care Assistance Level of  Assistance  Bathing, Feeding, Dressing Bathing Assistance: Maximum assistance Feeding assistance: Maximum assistance Dressing Assistance: Maximum assistance     Functional Limitations Info  Sight, Hearing, Speech Sight Info: Adequate Hearing Info: Adequate Speech Info: Adequate    SPECIAL CARE FACTORS FREQUENCY                       Contractures Contractures Info: Not present    Additional Factors Info  Code Status, Isolation Precautions, Allergies, Insulin Sliding Scale Code Status Info: Partial Allergies Info: Diazepam, Vancomycin     Isolation Precautions Info: MRSA     Current Medications (01/26/2016):  This is the current hospital active medication list Current Facility-Administered Medications  Medication Dose Route Frequency Provider Last Rate Last Dose  . 0.9 %  sodium chloride infusion  250 mL Intravenous PRN Corey Harold, NP 10 mL/hr at 01/26/16 0400 250 mL at 01/26/16 0400  . 0.9 %  sodium chloride infusion  250 mL Intravenous PRN Corey Harold, NP      . acetaminophen (TYLENOL) tablet 650 mg  650 mg Oral Q6H PRN Mercy Riding, MD   650 mg at 01/25/16 1526  . Chlorhexidine Gluconate Cloth 2 % PADS 6 each  6 each Topical Q0600 Corey Harold, NP   6 each at 01/26/16 1000  . famotidine (PEPCID) IVPB 20 mg premix  20 mg Intravenous Q12H Corey Harold, NP   20 mg at 01/25/16 2245  . fentaNYL (SUBLIMAZE) injection 25-50 mcg  25-50 mcg Intravenous Q2H PRN Mercy Riding, MD   50 mcg at 01/26/16 0855  . heparin ADULT infusion 100 units/mL (25000 units/27mL sodium chloride 0.45%)  2,000 Units/hr Intravenous Continuous Laren Everts, RPH 20 mL/hr at 01/26/16 0600 2,000 Units/hr at 01/26/16 0600  . hydrocortisone sodium succinate (SOLU-CORTEF) 100 MG injection 50 mg  50 mg Intravenous Q6H Corey Harold, NP   50 mg at 01/26/16 0837  . insulin aspart (novoLOG) injection 0-15 Units  0-15 Units Subcutaneous TID WC Mercy Riding, MD      . insulin aspart (novoLOG)  injection 0-5 Units  0-5 Units Subcutaneous QHS Mercy Riding, MD      . MEDLINE mouth rinse  15 mL Mouth Rinse Q2H PRN Brand Males, MD      . mupirocin ointment (BACTROBAN) 2 % 1 application  1 application Nasal BID Corey Harold, NP   1 application at [id number] [phone]  . piperacillin-tazobactam (ZOSYN) IVPB 3.375 g  3.375 g Intravenous Q8H [name], MD   3.375 g at 01/26/16 0837  . sodium chloride flush (NS) 0.9 % injection 10 mL  10 mL Intravenous Q12H Corey Harold, NP   10 mL at 01/26/16 0955  . sodium chloride flush (NS) 0.9 % injection 10 mL  10 mL Intravenous PRN Corey Harold, NP      . sodium chloride flush (NS) 0.9 % injection 10-40 mL  10-40 mL Intracatheter Q12H [name], MD      . sodium chloride flush (NS) 0.9 % injection 10-40 mL  10-40 mL Intracatheter PRN [name], MD      .  vancomycin (VANCOCIN) 1,250 mg in sodium chloride 0.9 % 250 mL IVPB  1,250 mg Intravenous Q12H [name], MD   1,250 mg at 01/26/16 [id number]     Discharge Medications: Please see discharge summary for a list of discharge medications.  Relevant Imaging Results:  Relevant Lab Results:   Additional Information    Boone Master, LCSW  Weekend Coverage

## 2016-01-26 NOTE — Progress Notes (Signed)
   VASCULAR SURGERY ASSESSMENT & PLAN:  Continue local wound care to left AKA wounds and right heel. If AKA wounds clean up some, he may be a candidate for a VAC.   Having some anxiety. He was on Ativan at home, will restart low dose until seen by primary service.   Afeb, but WBC is 31.6 K. He is on Vanco and Zosyn. Will get Xray of right foot to R/O osteo.   SUBJECTIVE: Anxious  PHYSICAL EXAM: Vitals:   01/26/16 0353 01/26/16 0400 01/26/16 0500 01/26/16 0600  BP:  129/79 130/70 138/76  Pulse:  98 91 96  Resp:  (!) 30 17 19   Temp: (!) 96.1 F (35.6 C)     TempSrc: Axillary     SpO2:  95% 97% 97%  Weight:      Height:       Dressings clean and dry (were changed at 8 PM)  LABS: Lab Results  Component Value Date   WBC 31.6 (H) 01/26/2016   HGB 11.5 (L) 01/26/2016   HCT 37.4 (L) 01/26/2016   MCV 77.4 (L) 01/26/2016   PLT 313 01/26/2016   Lab Results  Component Value Date   CREATININE 0.64 01/26/2016   Lab Results  Component Value Date   INR 1.58 01/24/2016   PROTIME 19.9 (A) 08/13/2015   CBG (last 3)   Recent Labs  01/25/16 2003 01/25/16 2315 01/26/16 0352  GLUCAP 130* 187* 167*    Active Problems:   Septic shock ([place])   Cellulitis of right leg    Gae Gallop Beeper: [id number] 01/26/2016

## 2016-01-26 NOTE — Progress Notes (Signed)
Physical Therapy Wound Evaluation Patient Details  Name: [name] MRN: [id number] Date of Birth: 01/07/1941  Today's Date: 01/26/2016 Time: 8299-3716 Time Calculation (min): 35 min  Subjective  Subjective: Reports has had wounds since June. Patient and Family Stated Goals: To heal wounds Date of Onset: 08/23/15 Prior Treatments: Wound care center, nursing care at SNF.  Pain Score:    Wound Assessment     Pressure Ulcer 10/31/15 Stage III -  Full thickness tissue loss. Subcutaneous fat may be visible but bone, tendon or muscle are NOT exposed. 50% stage 3, 50% unstageable when assessed on 8/9 (Active)  Dressing Type Moist to dry;Gauze (Comment) 01/26/2016  1:00 PM  Dressing Clean;Dry;Intact 01/26/2016  1:00 PM  Dressing Change Frequency Daily 01/26/2016  1:00 PM  State of Healing Early/partial granulation 01/26/2016  1:00 PM  Site / Wound Assessment Yellow;Pink 01/26/2016  1:00 PM  % Wound base Red or Granulating 5% 01/26/2016  1:00 PM  % Wound base Yellow 95% 01/26/2016  1:00 PM  % Wound base Black 0% 01/26/2016  1:00 PM  % Wound base Other (Comment) 0% 01/26/2016  1:00 PM  Peri-wound Assessment Intact;Erythema (blanchable) 01/26/2016  1:00 PM  Wound Length (cm) 3.7 cm 01/26/2016  1:00 PM  Wound Width (cm) 2.1 cm 01/26/2016  1:00 PM  Wound Depth (cm) 0 cm 01/26/2016  1:00 PM  Margins Unattached edges (unapproximated) 01/26/2016  1:00 PM  Drainage Amount Minimal 01/26/2016  1:00 PM  Drainage Description Serosanguineous 01/26/2016  1:00 PM  Treatment Hydrotherapy (Pulse lavage);Packing (Saline gauze);Tape changed;Cleansed 01/26/2016  1:00 PM     Wound / Incision (Open or Dehisced) 01/24/16 Incision - Dehisced;Other (Comment) Leg Left Open areas of old surgical site (L-AKA) (Active)  Dressing Type Gauze (Comment);Moist to dry 01/26/2016  1:00 PM  Dressing Changed Changed 01/26/2016  1:00 PM  Dressing Status Clean;Dry;Intact 01/26/2016  1:00 PM  Dressing Change Frequency Daily 01/26/2016  1:00  PM  Site / Wound Assessment Red;Pink;Yellow;Granulation tissue 01/26/2016  1:00 PM  % Wound base Red or Granulating 75% 01/26/2016  1:00 PM  % Wound base Yellow 5% 01/26/2016  1:00 PM  % Wound base Other (Comment) 20% 01/26/2016  1:00 PM  Peri-wound Assessment Intact;Erythema (blanchable) 01/26/2016  1:00 PM  Wound Length (cm) 6.8 cm 01/26/2016  1:00 PM  Wound Width (cm) 4.5 cm 01/26/2016  1:00 PM  Wound Depth (cm) 1.6 cm 01/26/2016  1:00 PM  Undermining (cm) 3.7 01/26/2016  1:00 PM  Margins Unattached edges (unapproximated) 01/26/2016  1:00 PM  Closure None 01/26/2016  1:00 PM  Drainage Amount Minimal 01/26/2016  1:00 PM  Drainage Description Serosanguineous 01/26/2016  1:00 PM  Non-staged Wound Description Full thickness 01/26/2016  1:00 PM  Treatment Cleansed;Hydrotherapy (Pulse lavage);Packing (Saline gauze) 01/26/2016  1:00 PM     Wound / Incision (Open or Dehisced) 01/26/16 Incision - Dehisced Thigh Left;Lateral open area dehisced incision from AKA (Active)  Dressing Type ABD;Moist to dry;Gauze (Comment) 01/26/2016  1:00 PM  Dressing Changed Changed 01/26/2016  1:00 PM  Dressing Status Clean;Dry;Intact 01/26/2016  1:00 PM  Dressing Change Frequency Daily 01/26/2016  1:00 PM  Site / Wound Assessment Yellow;Friable 01/26/2016  1:00 PM  % Wound base Red or Granulating 0% 01/26/2016  1:00 PM  % Wound base Yellow 100% 01/26/2016  1:00 PM  % Wound base Black 0% 01/26/2016  1:00 PM  % Wound base Other (Comment) 0% 01/26/2016  1:00 PM  Peri-wound Assessment Intact;Erythema (blanchable) 01/26/2016  1:00 PM  Wound  Length (cm) 4.4 cm 01/26/2016  1:00 PM  Wound Width (cm) 2 cm 01/26/2016  1:00 PM  Wound Depth (cm) 1.2 cm 01/26/2016  1:00 PM  Margins Unattached edges (unapproximated) 01/26/2016  1:00 PM  Closure None 01/26/2016  1:00 PM  Drainage Amount Minimal 01/26/2016  1:00 PM  Drainage Description Serosanguineous 01/26/2016  1:00 PM  Non-staged Wound Description Full thickness 01/26/2016  1:00 PM  Treatment  Cleansed;Debridement (Selective);Hydrotherapy (Pulse lavage);Packing (Saline gauze) 01/26/2016  1:00 PM   Hydrotherapy Pulsed lavage therapy - wound location: R lateral heel; L medial and lateral wounds AKA resid limb Pulsed Lavage with Suction (psi): 4 psi Pulsed Lavage with Suction - Normal Saline Used: 1000 mL Pulsed Lavage Tip: Tip with splash shield Selective Debridement Selective Debridement - Location: lateral aspect of L AKA and lateral R heel Selective Debridement - Tools Used: Forceps;Scissors Selective Debridement - Tissue Removed: yellow nonviable tissue   Wound Assessment and Plan  Wound Therapy - Assess/Plan/Recommendations Wound Therapy - Clinical Statement: Patient presents with decreased wound healing due to immobility, poor blood flow and decreased nutrition with polypharmacy.  He will benefit from skilled PT for hydrotherapy to remove devitalized tissue and improve healthy granulation for wound healing.  Wound Therapy - Functional Problem List: immobility, decreased blood flow, decreased nutrition, polypharmacy. Factors Delaying/Impairing Wound Healing: Altered sensation;Diabetes Mellitus;Immobility;Multiple medical problems;Polypharmacy;Vascular compromise;Infection - systemic/local Hydrotherapy Plan: Debridement;Dressing change;Patient/family education;Pulsatile lavage with suction Wound Therapy - Frequency: 6X / week Wound Therapy - Current Recommendations: Case manager/social work;Nutritionist Wound Therapy - Follow Up Recommendations: Skilled nursing facility Wound Plan: see above  Wound Therapy Goals- Improve the function of patient's integumentary system by progressing the wound(s) through the phases of wound healing (inflammation - proliferation - remodeling) by: Decrease Necrotic Tissue to: by 10% Decrease Necrotic Tissue - Progress: Goal set today Increase Granulation Tissue to: by 10% Increase Granulation Tissue - Progress: Goal set today Goals/treatment  plan/discharge plan were made with and agreed upon by patient/family: Yes Time For Goal Achievement: 2 weeks Wound Therapy - Potential for Goals: Fair  Goals will be updated until maximal potential achieved or discharge criteria met.  Discharge criteria: when goals achieved, discharge from hospital, MD decision/surgical intervention, no progress towards goals, refusal/missing three consecutive treatments without notification or medical reason.  GP     Reginia Naas 01/26/2016, 1:35 PM  Magda Kiel, [place] 01/26/2016

## 2016-01-26 NOTE — Progress Notes (Signed)
FPTS Interim Progress Note  S: Hemodynamically stable. CCM continue care of this patient and FPTS following. Appreciate the excellent care being provided. FPTS will resume care of the patient when he is transferred back to the general med-surg floor.  Everrett Coombe, MD 01/26/2016, 6:57 AM PGY-1, [facility] Service pager [phone]

## 2016-01-26 NOTE — Progress Notes (Signed)
PULMONARY / CRITICAL CARE MEDICINE   Name: [name] MRN: [id number] DOB: September 26, 1940    ADMISSION DATE:  01/24/2016 CONSULTATION DATE:  01/24/2016  REFERRING MD:  Dr. 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 He resides in SNF. He was in his usual state of health until the morning of 11/2 when he was noted to have fevers by nursing homes staff. On their evaluation he was found to have erythema to his right lower extrimity concerning for cellulitis. He was transported to the emergency department for further evaluation. In the ER he was also hypotensive code sepsis was initiated. He was started on broad-spectrum antibiotics and IV fluid resuscitation, however, systolic blood pressure remained low in the 80s. There was concern that he would require vasopressors and ICU admission, therefore PCCM asked to admit.  SIGNIFICANT EVENTS: 10/2015 > admit for osteo with L AKA  11/02: admitted to ICU due to cellulitis septic shock. BSA, levo, stress dose steroid  11/3 Off levophed. VSS recommend aggressive wound care.  Imaging 10/2 CXR: Cardiomegaly with left hemidiaphragm elevation and adjacent volume loss/atelectasis.    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   CULTURES: Blood culture 11/2 >NGTD x2 Urine culture 11/2 > NGTD MRSA PCR +ve  ANTIBIOTICS: Zosyn 11/2 > Vancomycin 11/2 >  LINES/TUBES: PICC on 11/3  SUBJECTIVE:  Reports right leg pain, left stump pain and headache. Headache the same as his usual headache. Denies chest pain or shortness of breath. He states that his wife was her this morning.   REVIEW OF SYSTEMS:   Denies chest pain, shortness of breath, abdominal pain, vision  changes, nausea or vomiting. Reports headache and leg pain.   VITAL SIGNS: BP 138/76   Pulse 96   Temp (!) 96.1 F (35.6 C) (Axillary)   Resp 19   Ht 5\' 3"  (1.6 m)   Wt 106.5 kg (234 lb 12.6 oz)   SpO2 97%   BMI 41.59 kg/m   HEMODYNAMICS: Off levophed on 11/3 On stress dose steroid KVO  VENTILATOR SETTINGS: On home oxygen, 2L by [place]  INTAKE / OUTPUT: I/O last 3 completed shifts: In: 5191.6 [I.V.:641.6; IV Piggyback:4550] Out: 1875 [Urine:1875]  PHYSICAL EXAMINATION: General:  Morbidly obese male Neuro:  Alert and awake, oriented to self, person and place HEENT:  [place]/AT, PERRL, no JVD Cardiovascular:  RRR, no MRG, DP pulse 2+ on right leg Lungs:  Clear, distant, no crackles antriorly Abdomen:  Soft, non-tender, non-distended, obese Musculoskeletal: Recent L AKA with clean dress Skin:  Erythema and warmth to RLE from his ankle to his knee. Erythema didn't seem to be expanding.   LABS:  BMET  Recent Labs Lab 01/25/16 0254 01/25/16 1800 01/26/16 0430  NA 140 138 137  K 3.0* 4.8 4.5  CL 99* 103 102  CO2 26 26 25   BUN 14 14 13   CREATININE 0.92 0.74 0.64  GLUCOSE 156* 185* 155*    Electrolytes  Recent Labs Lab 01/25/16 0254 01/25/16 1800 01/26/16 0430  CALCIUM 7.6* 8.8* 8.5*  MG 1.6*  --  2.3  PHOS 6.3*  --  3.3    CBC  Recent Labs Lab 01/24/16 1709 01/25/16 0254 01/26/16 0430  WBC 35.1* 26.4* 31.6*  HGB  11.5* 10.5* 11.5*  HCT 36.2* 34.3* 37.4*  PLT 288 228 313    Coag's  Recent Labs Lab 01/24/16 2330  01/25/16 0759 01/25/16 2032 01/26/16 0430  APTT 45*  < > 56* 73* 64*  INR 1.58  --   --   --   --   < > = values in this interval not displayed.  Sepsis Markers  Recent Labs Lab 01/24/16 1719 01/24/16 2036 01/25/16 1230 01/26/16 0430  LATICACIDVEN 1.90 1.57  --   --   PROCALCITON  --   --  0.75 0.56    ABG No results for input(s): PHART, PCO2ART, PO2ART in the last 168 hours.  Liver Enzymes  Recent Labs Lab  01/24/16 1709  AST 21  ALT 18  ALKPHOS 81  BILITOT 0.9  ALBUMIN 2.7*    Cardiac Enzymes No results for input(s): TROPONINI, PROBNP in the last 168 hours.  Glucose  Recent Labs Lab 01/25/16 0822 01/25/16 1152 01/25/16 1559 01/25/16 2003 01/25/16 2315 01/26/16 0352  GLUCAP 168* 175* 148* 130* 187* 167*    ASSESSMENT / PLAN:  PULMONARY A: COPD: stable on home 2L At risk volume overload OSA unclear CPAP  P:   Continue home O2 IS as able KVO fluids DNI ABG & CXR if MS worsens.  CARDIOVASCULAR A:  Septic shock secondary to RLE cellulitis. Negative LA HFrEF. Echo on 10/22/2015: 30-35% G2DD, PAPP 57mmHg CAD Hypotension: off levophed.   P:  Telemetry monitoring Stress dose steroids KVO  RENAL A:   Hypokalemia - resolved Hypomagnesemia: resolved  P:   Repeat BMP in AM Replace electrolytes as indicated  GASTROINTESTINAL A:   GERD  P:   ADAT Continue pepcid  HEMATOLOGIC A:   Chronic PE on Xarelto  P:  Heparin per pharmacy Hold xarelto  INFECTIOUS A:   Septic shock secondary to cellulitis Poorly healing wounds L stump Blood and urine cultures negative Vanc/Zosyn 11/2>>  P:   ABX as above Appreciate wound care recs Appreciate VVS recs Follow cultures  ENDOCRINE A:   DM   P:   CBG monitoring and SSI  NEUROLOGIC A:   Acute metabolic encephalopathy  P:   RASS goal: 0 Monitor   FAMILY  - family not at bedside. Will update wife when she arrives.   - Inter-disciplinary family meet or Palliative Care meeting due by:  11/9  Disposition: transfer to SDU  01/26/2016 6:26 AM Wendee Beavers, MD. FM resident, PGY-2

## 2016-01-26 NOTE — Progress Notes (Signed)
ANTICOAGULATION CONSULT NOTE - Follow Up Consult  Pharmacy Consult for heparin Indication: h/o PE  Labs:  Recent Labs  01/24/16 1709  01/24/16 2330 01/25/16 0254  01/25/16 1800 01/25/16 2032 01/26/16 0420 01/26/16 0430 01/26/16 1455  HGB 11.5*  --   --  10.5*  --   --   --   --  11.5*  --   HCT 36.2*  --   --  34.3*  --   --   --   --  37.4*  --   PLT 288  --   --  228  --   --   --   --  313  --   APTT  --   < > 45* 54*  < >  --  73*  --  64* 71*  LABPROT  --   --  19.1*  --   --   --   --   --   --   --   INR  --   --  1.58  --   --   --   --   --   --   --   HEPARINUNFRC  --   --  1.05*  --   --   --   --  0.33  --   --   CREATININE 0.99  --  0.88 0.92  --  0.74  --   --  0.64  --   < > = values in this interval not displayed.   Assessment: 75yo male continues on heparin for history of PE, on Xarelto PTA  Goal of Therapy:  aPTT 66-102 seconds   Plan:  Continue heparin at 2000 units / hr Follow up AM labs  Thank you Anette Guarneri, PharmD [phone]  01/26/2016,3:50 PM

## 2016-01-26 NOTE — Progress Notes (Signed)
ANTICOAGULATION CONSULT NOTE - Follow Up Consult  Pharmacy Consult for heparin Indication: h/o PE  Labs:  Recent Labs  01/24/16 1709  01/24/16 2330 01/25/16 0254 01/25/16 0759 01/25/16 1800 01/25/16 2032 01/26/16 0420 01/26/16 0430  HGB 11.5*  --   --  10.5*  --   --   --   --  11.5*  HCT 36.2*  --   --  34.3*  --   --   --   --  37.4*  PLT 288  --   --  228  --   --   --   --  313  APTT  --   < > 45* 54* 56*  --  73*  --  64*  LABPROT  --   --  19.1*  --   --   --   --   --   --   INR  --   --  1.58  --   --   --   --   --   --   HEPARINUNFRC  --   --  1.05*  --   --   --   --  0.33  --   CREATININE 0.99  --  0.88 0.92  --  0.74  --   --  0.64  < > = values in this interval not displayed.   Assessment: 75yo male now slightly below goal after one PTT at goal; per RN no signs of bleeding.  Goal of Therapy:  aPTT 66-102 seconds   Plan:  Will increase heparin gtt by 2 units/kg/hr to 2000 units/hr and check PTT in [place], PharmD, BCPS  01/26/2016,5:38 AM

## 2016-01-26 NOTE — Progress Notes (Signed)
Report given to receiving RN. All questions answered. Patient transferred by Allegra Grana, RN and Elenor Quinones. NT with no telemetry. Patient with no c/o pain. Number given for future questions. All belongings sent with patient.

## 2016-01-27 DIAGNOSIS — A419 Sepsis, unspecified organism: Secondary | ICD-10-CM

## 2016-01-27 LAB — GLUCOSE, CAPILLARY
Glucose-Capillary: 167 mg/dL — ABNORMAL HIGH (ref 65–99)
Glucose-Capillary: 171 mg/dL — ABNORMAL HIGH (ref 65–99)
Glucose-Capillary: 190 mg/dL — ABNORMAL HIGH (ref 65–99)
Glucose-Capillary: 183 mg/dL — ABNORMAL HIGH (ref 65–99)

## 2016-01-27 LAB — BASIC METABOLIC PANEL
Anion gap: 8 (ref 5–15)
BUN: 11 mg/dL (ref 6–20)
CALCIUM: 8.6 mg/dL — AB (ref 8.9–10.3)
CO2: 26 mmol/L (ref 22–32)
CREATININE: 0.74 mg/dL (ref 0.61–1.24)
Chloride: 103 mmol/L (ref 101–111)
GFR calc Af Amer: >60 mL/min (ref >60)
GLUCOSE: 236 mg/dL — AB (ref 65–99)
Potassium: 3.8 mmol/L (ref 3.5–5.1)
Sodium: 137 mmol/L (ref 135–145)

## 2016-01-27 LAB — CBC
HEMATOCRIT: 35.2 % — AB (ref 39.0–52.0)
Hemoglobin: 10.9 g/dL — ABNORMAL LOW (ref 13.0–17.0)
MCH: 23.8 pg — ABNORMAL LOW (ref 26.0–34.0)
MCHC: 31 g/dL (ref 30.0–36.0)
MCV: 76.9 fL — AB (ref 78.0–100.0)
Platelets: 316 10*3/uL (ref 150–400)
RBC: 4.58 MIL/uL (ref 4.22–5.81)
RDW: 16.7 % — AB (ref 11.5–15.5)
WBC: 19.3 10*3/uL — ABNORMAL HIGH (ref 4.0–10.5)

## 2016-01-27 LAB — VANCOMYCIN, TROUGH: Vancomycin Tr: 33 ug/mL (ref 15–20)

## 2016-01-27 LAB — APTT: APTT: 84 s — AB (ref 24–36)

## 2016-01-27 LAB — PHOSPHORUS: PHOSPHORUS: 2.7 mg/dL (ref 2.5–4.6)

## 2016-01-27 LAB — HEPARIN LEVEL (UNFRACTIONATED): Heparin Unfractionated: 0.42 IU/mL (ref 0.30–0.70)

## 2016-01-27 MED ORDER — HYDROCORTISONE NA SUCCINATE PF 100 MG IJ SOLR
50.0000 mg | Freq: Three times a day (TID) | INTRAMUSCULAR | Status: DC
  Administered 2016-01-27 – 2016-01-28 (×4): 50 mg via INTRAVENOUS
  Filled 2016-01-27 (×4): qty 2

## 2016-01-27 MED ORDER — FUROSEMIDE 80 MG PO TABS
80.0000 mg | ORAL_TABLET | Freq: Two times a day (BID) | ORAL | Status: DC
  Administered 2016-01-27 – 2016-01-29 (×5): 80 mg via ORAL
  Filled 2016-01-27 (×5): qty 1

## 2016-01-27 MED ORDER — ALPRAZOLAM 0.5 MG PO TABS
0.5000 mg | ORAL_TABLET | Freq: Three times a day (TID) | ORAL | Status: DC | PRN
  Administered 2016-01-27 (×2): 0.5 mg via ORAL
  Filled 2016-01-27 (×2): qty 1

## 2016-01-27 MED ORDER — GABAPENTIN 600 MG PO TABS
600.0000 mg | ORAL_TABLET | Freq: Three times a day (TID) | ORAL | Status: DC
  Administered 2016-01-27 – 2016-01-29 (×8): 600 mg via ORAL
  Filled 2016-01-27 (×8): qty 1

## 2016-01-27 MED ORDER — ALBUTEROL SULFATE (2.5 MG/3ML) 0.083% IN NEBU: 2.5000 mg | INHALATION_SOLUTION | Freq: Four times a day (QID) | RESPIRATORY_TRACT | Status: DC | PRN

## 2016-01-27 NOTE — Progress Notes (Signed)
Family Medicine Teaching Service Daily Progress Note Intern Pager: [phone]  Patient name: [name] Medical record number: [id number] Date of birth: 11/27/1940 Age: 75 y.o. Gender: male  Primary Care Provider: [name], MD Consultants: vascular surgery  Code Status: Partial (only ACLS medications and NIPPV/BiPap)  Pt Overview and Major Events to Date:  11/2: admitted to [facility] due to septic shock from RLE cellulitis. No lactic acidosis. Started on Levo and stress dose steroids. Started on Vanc and Zosyn  11/3: Off Levophed @1100 . PICC placed.  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  presented with fevers and lethargy found to have septic shock due to RLE cellulitis initially requiring pressor support in the ICU. Transferred to [place] on 11/5.    Septic Shock due to RLE Cellulitis, septic shock resolved: Leukocytosis improving. X-ray of R foot without signs of osteo. Erythema seems to be extending drawn border. Vitals are stable.  - Continue Vancomycin and Zosyn (Day 4;3 respectively) - consider broadening to carbapenem? - Blood Cx: NG x 3 days  - Urine cx: NG final - Vascular surgery consulted, appreciate reccs: x-ray of R foot without acute findings.  - Solu-cortef 50mg  (started 11/3) > will wean from q 6 hrs to q 8 today for the next 24 hours, then continue wean  - currently on Fentanyl q2 PRN for pain   Chronic Wounds: poorly heading wounds of L stump. Wound on R heel.  - wound care consulted  - vascular surgery: dressing changes to L AKA wounds. Wounds not clean enough to place negative pressure dressing. These wounds not current source of sepsis.   Chronic PE: On xarelto at SNF.  - Heparin per pharmacy   Chronic Systolic HF, CAD, HTN: ECHO 09/2015 with EF 30-35%, severe hypokinesis  of the inferior and inferolateral walls, G2DD. Home meds: ASA, Lipitor, Coreg 3.125mg  BID, Lasix 80mg  BID - monitor fluid status  - consider starting Lasix at least at half home dose  DM2 with PVD: A1c 6.4 (10/2015). On NPH 20U BID and Humalog sliding scale at SNF. CBGs stable.  - ACHS SSI   L BKA: home meds: Neurontin 600mg  TID and  Robaxin 500mg  TID  - Gabapentin 600mg  TID (restarted today)   Chronic COPD:  - per chart review, patient declined medications as outpatient - Albuterol q 2 PRN   GERD: Prilosec 40mg  daily at SNF - Pepcid 20mg  BID  Anxiety/Depression: On Ativan PRN, Trazodone 100mg  at bedtime, Cymbalta 90mg  daily at home.  -  Considered restarting Cymbalta, but initial EKG with QTc 546 (shorter on manual calculation) -  will repeat EKG to check QT prior to starting Cymbalta  -  Xanax 0.5mg  TID PRN anxiety   Chronic Constipation: on Senokot and Miralax daily at SNF. Having BMs without medications.  - holding scheduled home meds   BPH: on Flomax at SNF. Currently has urethral catheter  FEN/GI: heart healthy/carb modified  PPx: Heparin drip (on xarelto at snf for chronic PE)  Disposition: pending improvement.   Subjective:  Reports his bottom and legs hurt; this is chronic. Also reports his legs hurt. Reports he feels short of breath and anxious. No chest pain.   Objective: Temp:  [97.4 F (36.3 C)-98.7 F (37.1 C)] 97.7 F (36.5 C) (11/05 0615) Pulse Rate:  [84-109] 84 (11/05 0615) Resp:  [  17-25] 18 (11/05 0615) BP: (93-151)/(62-99) 148/88 (11/05 0615) SpO2:  [92 %-100 %] 95 % (11/05 0615) Weight:  [111.1 kg (244 lb 14.9 oz)] 111.1 kg (244 lb 14.9 oz) (11/04 2039) Physical Exam: General: NAD, alert and awake, on [place] Cardiovascular: RRR, no m/r/b Respiratory: On [place], normal wob, diminished breath sounds at bases bilaterally but no crackles or wheezing noted  Abdomen: abdominal obesity, non-tender, + BS Extremities: RLE wit erythema up to knee. The erythema has  progressed past the border drawn yesterday. L AKA bandage dry and clean. PICC line in R upper arm   Laboratory:  Recent Labs Lab 01/25/16 0254 01/26/16 0430 01/27/16 0534  WBC 26.4* 31.6* 19.3*  HGB 10.5* 11.5* 10.9*  HCT 34.3* 37.4* 35.2*  PLT 228 313 316    Recent Labs Lab 01/24/16 1709  01/25/16 0254 01/25/16 1800 01/26/16 0430  NA 131*  < > 140 138 137  K 2.9*  < > 3.0* 4.8 4.5  CL 91*  < > 99* 103 102  CO2 29  < > 26 26 25   BUN 17  < > 14 14 13   CREATININE 0.99  < > 0.92 0.74 0.64  CALCIUM 8.7*  < > 7.6* 8.8* 8.5*  PROT 6.2*  --   --   --   --   BILITOT 0.9  --   --   --   --   ALKPHOS 81  --   --   --   --   ALT 18  --   --   --   --   AST 21  --   --   --   --   GLUCOSE 181*  < > 156* 185* 155*  < > = values in this interval not displayed.   Imaging/Diagnostic Tests: 10/2 CXR: Cardiomegaly with left hemidiaphragm elevation and adjacent volume loss/atelectasis.   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  11/4 DG R Foot: OA of the first MTP joint. No acute findings   Smiley Houseman, MD 01/27/2016, 6:45 AM PGY-2, [place] Intern pager: [phone], text pages welcome

## 2016-01-27 NOTE — Progress Notes (Signed)
[place] for heparin Indication: history of PE  Allergies  Allergen Reactions  . Diazepam Anxiety    REACTION: makes patient cry  . Vancomycin Other (See Comments)    "red man syndrome"    Patient Measurements: Height: 5\' 3"  (160 cm) Weight: 244 lb 14.9 oz (111.1 kg) IBW/kg (Calculated) : 56.9 Heparin Dosing Weight: 88 kg  Vital Signs: Temp: 97.7 F (36.5 C) (11/05 0615) Temp Source: Oral (11/05 0615) BP: 148/88 (11/05 0615) Pulse Rate: 84 (11/05 0615)  Labs:  Recent Labs  01/24/16 2330 01/25/16 0254  01/25/16 1800  01/26/16 0420 01/26/16 0430 01/26/16 1455 01/27/16 0534  HGB  --  10.5*  --   --   --   --  11.5*  --  10.9*  HCT  --  34.3*  --   --   --   --  37.4*  --  35.2*  PLT  --  228  --   --   --   --  313  --  316  APTT 45* 54*  < >  --   < >  --  64* 71* 84*  LABPROT 19.1*  --   --   --   --   --   --   --   --   INR 1.58  --   --   --   --   --   --   --   --   HEPARINUNFRC 1.05*  --   --   --   --  0.33  --   --  0.42  CREATININE 0.88 0.92  --  0.74  --   --  0.64  --   --   < > = values in this interval not displayed.  Estimated Creatinine Clearance: 86 mL/min (by C-G formula based on SCr of 0.64 mg/dL).   Medical History: Past Medical History:  Diagnosis Date  . Adhesive capsulitis   . Angina   . Anxiety   . CAD (coronary artery disease)   . CHF (congestive heart failure) ([place])   . Chronic back pain   . Chronic kidney disease    hx of BPH  . COPD (chronic obstructive pulmonary disease) ([place])   . CVA (cerebral infarction) Questionable history  . Depression   . Diabetes mellitus   . Diverticulosis   . Falls frequently 06/2014  . GERD (gastroesophageal reflux disease)   . Hyperlipidemia   . Hypertension   . 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 Myocardial infarction   . Neuromuscular disorder ([place])    HX of diabetic periferal neuropathy  . On home oxygen  therapy    "1.5L prn" (04/12/2014)  . Osteomyelitis ([place]) 2012   Sternoclavicular joint   . PE (pulmonary embolism) 10-15 years ago   Lifelong Coumadin  . Peripheral vascular disease ([place])   . Shortness of breath   . Sleep apnea     Assessment: 75 yo male on Xarelto PTA for history of PE, currently on hold and continuing on heparin infusion. Last dose of Xarelto on 11/1 at unknown time. Heparin level therapeutic at 0.42 and aPTT therapeutic at 84, can likely follow heparin levels only as they appear to be correlating now. Hgb stable, plt normal, no S/Sx bleeding documented.  Goal of Therapy:  Heparin level 0.3-0.7 units/ml aPTT 66-102 seconds Monitor platelets by anticoagulation protocol: Yes   Plan:  -Continue heparin  at 2000 units/hr -Daily heparin level/aPTT/CBC -Monitor for s/sx bleeding  Arrie Senate, PharmD PGY-1 Pharmacy Resident Pager: [phone] 01/27/2016

## 2016-01-27 NOTE — Progress Notes (Signed)
PULMONARY / CRITICAL CARE MEDICINE   Name: [name] MRN: [id number] DOB: [birth year]    ADMISSION DATE:  01/24/2016 CONSULTATION DATE:  01/24/2016  REFERRING MD:  Dr. 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 He resides in SNF. He was in his usual state of health until the morning of 11/2 when he was noted to have fevers by nursing homes staff. On their evaluation he was found to have erythema to his right lower extrimity concerning for cellulitis. He was transported to the emergency department for further evaluation. In the ER he was also hypotensive code sepsis was initiated. He was started on broad-spectrum antibiotics and IV fluid resuscitation, however, systolic blood pressure remained low in the 80s. There was concern that he would require vasopressors and ICU admission, therefore PCCM asked to admit.  SIGNIFICANT EVENTS: 10/2015 > admit for osteo with L AKA  11/02: admitted to ICU due to cellulitis septic shock. BSA, levo, stress dose steroid  11/3 Off levophed. VSS recommend aggressive wound care.  Imaging 10/2 CXR: Cardiomegaly with left hemidiaphragm elevation and adjacent volume loss/atelectasis.    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   CULTURES: Blood culture 11/2 >NGTD x2 Urine culture 11/2 > NGTD MRSA PCR +ve  ANTIBIOTICS: Zosyn 11/2 > Vancomycin 11/2 >  LINES/TUBES: PICC on 11/3  SUBJECTIVE:  Examined today w Vascular Surgery. Pt states pain control not adequate. Nurse says she will watch closely today for fentanyl needs, but she thinks key issue is more anxiety and asks Rx.  REVIEW OF SYSTEMS:     VITAL SIGNS: BP (!) 148/88 (BP Location: Left Arm)   Pulse 84    Temp 97.7 F (36.5 C) (Oral)   Resp 18   Ht 5\' 3"  (1.6 m)   Wt 111.1 kg (244 lb 14.9 oz)   SpO2 95%   BMI 43.39 kg/m   HEMODYNAMICS: Off levophed on 11/3 On stress dose steroid KVO  VENTILATOR SETTINGS: On home oxygen, 2L by [place]  INTAKE / OUTPUT: I/O last 3 completed shifts: In: 3817.3 [P.O.:1620; I.V.:1197.3; IV Piggyback:1000] Out: [id number] [Urine:1675]  PHYSICAL EXAMINATION: General:  Morbidly obese male Neuro:  Alert and awake, oriented to self, person and place HEENT:  [place]/AT, PERRL, no JVD Cardiovascular:  RRR, no MRG, DP pulse 2+ on right leg Lungs:   distant, no crackles antriorly. Wheeze upper zones, unlabored Abdomen:  Soft, non-tender, non-distended, obese Musculoskeletal: Recent L AKA with clean dress Skin:  Erythema and warmth to RLE from his ankle to his knee. R foot pale.   LABS:  BMET  Recent Labs Lab 01/25/16 0254 01/25/16 1800 01/26/16 0430  NA 140 138 137  K 3.0* 4.8 4.5  CL 99* 103 102  CO2 26 26 25   BUN 14 14 13   CREATININE 0.92 0.74 0.64  GLUCOSE 156* 185* 155*    Electrolytes  Recent Labs Lab 01/25/16 0254 01/25/16 1800 01/26/16 0430 01/27/16 0534  CALCIUM 7.6* 8.8* 8.5*  --   MG 1.6*  --  2.3  --   PHOS 6.3*  --  3.3 2.7    CBC  Recent Labs Lab 01/25/16 0254 01/26/16 0430 01/27/16 0534  WBC 26.4* 31.6* 19.3*  HGB 10.5* 11.5* 10.9*  HCT 34.3* 37.4* 35.2*  PLT 228 313 316    Coag's  Recent Labs Lab 01/24/16 2330  01/26/16 0430 01/26/16 1455 01/27/16 0534  APTT 45*  < > 64* 71* 84*  INR 1.58  --   --   --   --   < > = values in this interval not displayed.  Sepsis Markers  Recent Labs Lab 01/24/16 1719 01/24/16 2036 01/25/16 1230 01/26/16 0430  LATICACIDVEN 1.90 1.57  --   --   PROCALCITON  --   --  0.75 0.56    ABG No results for input(s): PHART, PCO2ART, PO2ART in the last 168 hours.  Liver Enzymes  Recent Labs Lab 01/24/16 1709  AST 21  ALT 18  ALKPHOS 81  BILITOT 0.9  ALBUMIN 2.7*     Cardiac Enzymes No results for input(s): TROPONINI, PROBNP in the last 168 hours.  Glucose  Recent Labs Lab 01/26/16 0352 01/26/16 0856 01/26/16 1212 01/26/16 1711 01/26/16 2046 01/27/16 0812  GLUCAP 167* 160* 190* 161* 167* 167*    ASSESSMENT / PLAN:  PULMONARY A: COPD: stable on home 2L At risk volume overload OSA unclear CPAP wheezing P:   Continue home O2 IS as able KVO fluids DNI ABG & CXR if MS worsens. Added neb albuterol prn  CARDIOVASCULAR A:  Septic shock secondary to RLE cellulitis. Negative LA HFrEF. Echo on 10/22/2015: 30-35% G2DD, PAPP 33mmHg CAD Hypotension: off levophed.  No other sepsis source identified except legs P:  Telemetry monitoring Stress dose steroids KVO  RENAL A:   Hypokalemia - resolved Hypomagnesemia: resolved  P:   Repeat BMP in AM Replace electrolytes as indicated  GASTROINTESTINAL A:   GERD  P:   ADAT Continue pepcid  HEMATOLOGIC A:   Chronic PE on Xarelto  P:  Heparin per pharmacy Hold xarelto  INFECTIOUS A:   Septic shock secondary to cellulitis Poorly healing wounds L stump Examined 11/5 w Vascular Sgy. Decubitus not significant. No other infection source evident. WBC coming down. Blood and urine cultures negative Vanc/Zosyn 11/2>>  P:   ABX as above Appreciate wound care recs Appreciate VVS recs Follow cultures  ENDOCRINE A:   DM   P:   CBG monitoring and SSI  NEUROLOGIC A:   Acute metabolic encephalopathy  P:   RASS goal: 0 Monitor   FAMILY  - family not at bedside. Will update wife when she arrives.     Disposition: Transferring to [facility] as primary. Please call PCCM if needed.  01/27/2016 8:27 AM Wendee Beavers, MD. FM resident, PGY-2

## 2016-01-27 NOTE — Progress Notes (Signed)
   VASCULAR SURGERY ASSESSMENT & PLAN:  Dressing changed this AM. The Left AKA wounds are stable. I did some excisional debridement of necrotic tissue on lateral wound. Best option may be placement of a VAC on these wounds, which I believe was also Dr. Claretha Cooper plans based on his last office note.   Right heel wound (lateral) is unchanged. Cont IV ABx (Zosyn and Vanco) and local wound care. Based on his previous arteriogram in August, there are no options for revascularization. He has in-line flow via anterior tibial and peroneal arteries.  X-ray of right foot did not show any obvious evidence of osteo. His WBC is coming down.   SUBJECTIVE: No specific complaints  PHYSICAL EXAM: Vitals:   01/26/16 1057 01/26/16 1706 01/26/16 2039 01/27/16 0615  BP: 135/82 93/62 (!) 151/99 (!) 148/88  Pulse: (!) 109 95 (!) 106 84  Resp: (!) 21 17 19 18   Temp: 98.7 F (37.1 C) 97.5 F (36.4 C) 97.6 F (36.4 C) 97.7 F (36.5 C)  TempSrc: Oral Oral Oral Oral  SpO2: 98% 100% 93% 95%  Weight:   244 lb 14.9 oz (111.1 kg)   Height:       Right heel wound unchanged. Still some cellulitis right leg.   LABS: Lab Results  Component Value Date   WBC 19.3 (H) 01/27/2016   HGB 10.9 (L) 01/27/2016   HCT 35.2 (L) 01/27/2016   MCV 76.9 (L) 01/27/2016   PLT 316 01/27/2016   X-Ray right foot: no obvious evidence of osteo.   CBG (last 3)   Recent Labs  01/26/16 1711 01/26/16 2046 01/27/16 0812  GLUCAP 161* 167* 167*    Active Problems:   Septic shock ([place])   Cellulitis of right leg    Gae Gallop Beeper: [id number] 01/27/2016

## 2016-01-27 NOTE — Progress Notes (Signed)
CRITICAL VALUE ALERT  Critical value received:  Vancomycin trough 33  Date of notification:  01/27/2016  Time of notification:  [id number]  Critical value read back: yes  Nurse who received alert:  Rosalita Chessman, RN   MD notified (1st page):  Luci Bank, MD  Time of first page:  1029  MD notified (2nd page):  Time of second page:  Responding MD:  Luci Bank, MD  Time MD responded:  1030  Vancomycin on hold at this time.  Pharmacy has been notified by this Rn.

## 2016-01-27 NOTE — Progress Notes (Signed)
Pharmacy Antibiotic Note [name] is a 75 y.o. male admitted on 01/24/2016 with right leg cellulitis. Pharmacy has been consulted for Zosyn and vancomycin dosing.  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 Vancomycin trough this morning was elevated at 33 - nurse did not administer 0900 dose, will hold vancomycin for now. SCr up to 0.74 from 0.64, WBC trending down to 19.3, afebrile.  Plan: -Continue Zosyn 3.375 grams IV every 8 hours (infused over 4 hours) -Hold vancomycin today -Follow-up vancomycin level tomorrow morning -Monitor SCr closely -Follow-up culture data, length of therapy, S/Sx infection   Height: 5\' 3"  (160 cm) Weight: 244 lb 14.9 oz (111.1 kg) IBW/kg (Calculated) : 56.9  Temp (24hrs), Avg:97.9 F (36.6 C), Min:97.5 F (36.4 C), Max:98.7 F (37.1 C)   Recent Labs Lab 01/24/16 1709 01/24/16 1719 01/24/16 2036 01/24/16 2330 01/25/16 0254 01/25/16 1800 01/26/16 0430 01/27/16 0534 01/27/16 0900  WBC 35.1*  --   --   --  26.4*  --  31.6* 19.3*  --   CREATININE 0.99  --   --  0.88 0.92 0.74 0.64  --  0.74  LATICACIDVEN  --  1.90 1.57  --   --   --   --   --   --   VANCOTROUGH  --   --   --   --   --   --   --   --  33*    Estimated Creatinine Clearance: 86 mL/min (by C-G formula based on SCr of 0.74 mg/dL).    Allergies  Allergen Reactions  . Diazepam Anxiety    REACTION: makes patient cry  . Vancomycin Other (See Comments)    "red man syndrome"    Antimicrobials this admission:  11/2 Zosyn >>  11/2 Vancomycin >>   Dose adjustments this admission:  11/5: VT = 33, hold vancomycin  Microbiology results:  11/2 BCx: NGTD 11/2 UCx: NGTD 11/2 MRSA PCR: pos  Thank you for allowing pharmacy to be a part of this patient's care.  Arrie Senate, PharmD PGY-1 Pharmacy Resident Pager: [phone] 01/27/2016

## 2016-01-28 LAB — GLUCOSE, CAPILLARY
GLUCOSE-CAPILLARY: 176 mg/dL — AB (ref 65–99)
Glucose-Capillary: 239 mg/dL — ABNORMAL HIGH (ref 65–99)
Glucose-Capillary: 171 mg/dL — ABNORMAL HIGH (ref 65–99)
Glucose-Capillary: 218 mg/dL — ABNORMAL HIGH (ref 65–99)

## 2016-01-28 LAB — BASIC METABOLIC PANEL
Anion gap: 10 (ref 5–15)
BUN: 12 mg/dL (ref 6–20)
CALCIUM: 8.5 mg/dL — AB (ref 8.9–10.3)
CHLORIDE: 103 mmol/L (ref 101–111)
CO2: 25 mmol/L (ref 22–32)
CREATININE: 0.73 mg/dL (ref 0.61–1.24)
GFR calc non Af Amer: >60 mL/min (ref >60)
GLUCOSE: 193 mg/dL — AB (ref 65–99)
Potassium: 3.8 mmol/L (ref 3.5–5.1)
Sodium: 138 mmol/L (ref 135–145)

## 2016-01-28 LAB — CBC
HEMATOCRIT: 33.5 % — AB (ref 39.0–52.0)
HEMOGLOBIN: 10.5 g/dL — AB (ref 13.0–17.0)
MCH: 23.6 pg — AB (ref 26.0–34.0)
MCHC: 31.3 g/dL (ref 30.0–36.0)
MCV: 75.5 fL — ABNORMAL LOW (ref 78.0–100.0)
Platelets: 280 10*3/uL (ref 150–400)
RBC: 4.44 MIL/uL (ref 4.22–5.81)
RDW: 16.5 % — AB (ref 11.5–15.5)
WBC: 11.7 10*3/uL — ABNORMAL HIGH (ref 4.0–10.5)

## 2016-01-28 LAB — VANCOMYCIN, RANDOM: Vancomycin Rm: 18

## 2016-01-28 LAB — HEPARIN LEVEL (UNFRACTIONATED): Heparin Unfractionated: 0.35 IU/mL (ref 0.30–0.70)

## 2016-01-28 LAB — PHOSPHORUS: PHOSPHORUS: 2.9 mg/dL (ref 2.5–4.6)

## 2016-01-28 MED ORDER — DOXYCYCLINE HYCLATE 100 MG PO TABS
100.0000 mg | ORAL_TABLET | Freq: Two times a day (BID) | ORAL | Status: DC
  Administered 2016-01-28 – 2016-01-29 (×3): 100 mg via ORAL
  Filled 2016-01-28 (×3): qty 1

## 2016-01-28 MED ORDER — VANCOMYCIN HCL 10 G IV SOLR
1250.0000 mg | INTRAVENOUS | Status: DC
  Administered 2016-01-28: 1250 mg via INTRAVENOUS
  Filled 2016-01-28: qty 1250

## 2016-01-28 MED ORDER — HYDROCODONE-ACETAMINOPHEN 10-325 MG PO TABS
1.0000 | ORAL_TABLET | Freq: Four times a day (QID) | ORAL | Status: DC | PRN
  Administered 2016-01-28 – 2016-01-29 (×2): 1 via ORAL
  Filled 2016-01-28 (×2): qty 1

## 2016-01-28 MED ORDER — WHITE PETROLATUM GEL: Status: DC | PRN

## 2016-01-28 MED ORDER — RIVAROXABAN 20 MG PO TABS
20.0000 mg | ORAL_TABLET | Freq: Every day | ORAL | Status: DC
  Administered 2016-01-28 – 2016-01-29 (×2): 20 mg via ORAL
  Filled 2016-01-28 (×2): qty 1

## 2016-01-28 MED ORDER — CARVEDILOL 3.125 MG PO TABS
3.1250 mg | ORAL_TABLET | Freq: Two times a day (BID) | ORAL | Status: DC
  Administered 2016-01-28 – 2016-01-29 (×3): 3.125 mg via ORAL
  Filled 2016-01-28 (×3): qty 1

## 2016-01-28 MED ORDER — HYDROCORTISONE NA SUCCINATE PF 100 MG IJ SOLR
50.0000 mg | Freq: Two times a day (BID) | INTRAMUSCULAR | Status: DC
  Administered 2016-01-28: 50 mg via INTRAVENOUS
  Filled 2016-01-28: qty 2

## 2016-01-28 MED ORDER — LORAZEPAM 0.5 MG PO TABS
0.5000 mg | ORAL_TABLET | Freq: Two times a day (BID) | ORAL | Status: DC | PRN
  Administered 2016-01-28: 0.5 mg via ORAL
  Filled 2016-01-28: qty 1

## 2016-01-28 NOTE — Progress Notes (Signed)
Family Medicine Teaching Service Daily Progress Note Intern Pager: [phone]  Patient name: [name] Medical record number: [id number] Date of birth: [birth year] Age: 75 y.o. Gender: male  Primary Care Provider: [name], MD Consultants: vascular surgery  Code Status: Partial (only ACLS medications and NIPPV/BiPap)  Pt Overview and Major Events to Date:  11/02: admitted to CCM due to septic shock from RLE cellulitis. No lactic acidosis. Started on Levo and stress dose steroids. Started on Vanc and Zosyn  11/03: Off Levophed @1100 . PICC placed.  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  presented with fevers and lethargy found to have septic shock due to RLE cellulitis initially requiring pressor support in the ICU. Transferred to [name] on 11/5.    #Septic Shock due to RLE Cellulitis, septic shock resolved:  Leukocytosis improving. X-ray of R foot without signs of osteo. Vitals are stable. Erythema has improved and within margins. --Completed Vancomycin and Zosyn (day 5;4 respectively), will transition to PO doxycycline 100 mg BID given improvement (day 6 of abx) --Blood Cx: NG x 3 days  --Urine Cx: NG final --Vascular surgery consulted, appreciate recs  --Solu-cortef 50 mg BID (started 11/3>), will continue to wean after 24 hrs --Currently on Fentanyl q2 PRN for pain, last given 11/06 @0757   #Chronic Wounds:  Poorly heading wounds of L stump. Wound on R heel. Appears improved enough for wound VAC per vascular recs.  --Vascular following, appreciate recs --Wound care following, appreciate recs  #Chronic PE:  On xarelto at [facility].  --Heparin per pharmacy   #Chronic Systolic HF, CAD, HTN:  ECHO 09/2015 with EF 30-35%, severe hypokinesis of the inferior and  inferolateral walls, G2DD. Home meds: ASA, Lipitor, Coreg 3.125mg  BID, Lasix 80mg  BID --Monitor fluid status  --Lasix 80 mg PO BID  #DM2 with PVD:  A1c 6.4 (10/2015). On NPH 20U BID and Humalog sliding scale at SNF. CBGs stable.  --ACHS SSI   #L BKA:  Home meds: Neurontin 600mg  TID and  Robaxin 500mg  TID  --Gabapentin 600mg  TID (restarted today)   #Chronic COPD:  --Per chart review, patient declined medications as outpatient --Albuterol q2h PRN   #GERD:  Prilosec 40mg  daily at SNF --Pepcid 20mg  BID  #Anxiety/Depression:  On Ativan PRN, Trazodone 100mg  at bedtime, Cymbalta 90mg  daily at home.  --Considered restarting Cymbalta, but initial EKG with QTc 546 (shorter on manual calculation), will repeat EKG to check QT prior to starting Cymbalta  --Discontinued Xanax 0.5mg  TID PRN anxiety, started on home Ativan 0.5 mg PO BID PRN anxiety  #Chronic Constipation:  On Senokot and Miralax daily at SNF. Having BMs without medications.  --Holding scheduled home meds   #BPH:  On Flomax at SNF. Currently has urethral catheter  FEN/GI: Heart healthy/carb modified  PPx: Xarelto  Disposition: pending improvement of cellulitis  Subjective:  Reports his bottom and legs hurt; this is chronic. No nausea, chest pain, or dyspnea. Eating breakfast and comfortable in bed.  Objective: Temp:  [97.6 F (36.4 C)-97.9 F (36.6 C)] 97.6 F (36.4 C) (11/06 0521) Pulse Rate:  [75-88] 79 (11/06 0521) Resp:  [16-18] 17 (11/06 0521) BP: (105-130)/(62-98) 130/65 (11/06 0521) SpO2:  [96 %-98 %] 96 % (11/06 0521) Weight:  [245 lb 6 oz (111.3 kg)] 245 lb 6 oz (111.3 kg) (11/05  2123) Physical Exam: General: NAD, alert and awake, on [place] Cardiovascular: RRR, no m/r/b Respiratory: On [place], normal wob, diminished breath sounds at bases bilaterally but no crackles or wheezing noted  Abdomen: abdominal obesity, non-tender, + BS Extremities: RLE wit erythema up to knee. The erythema has progressed past the  border drawn yesterday. L AKA bandage dry and clean. PICC line in R upper arm     Laboratory:  Recent Labs Lab 01/25/16 0254 01/26/16 0430 01/27/16 0534  WBC 26.4* 31.6* 19.3*  HGB 10.5* 11.5* 10.9*  HCT 34.3* 37.4* 35.2*  PLT 228 313 316    Recent Labs Lab 01/24/16 1709  01/25/16 1800 01/26/16 0430 01/27/16 0900  NA 131*  < > 138 137 137  K 2.9*  < > 4.8 4.5 3.8  CL 91*  < > 103 102 103  CO2 29  < > 26 25 26   BUN 17  < > 14 13 11   CREATININE 0.99  < > 0.74 0.64 0.74  CALCIUM 8.7*  < > 8.8* 8.5* 8.6*  PROT 6.2*  --   --   --   --   BILITOT 0.9  --   --   --   --   ALKPHOS 81  --   --   --   --   ALT 18  --   --   --   --   AST 21  --   --   --   --   GLUCOSE 181*  < > 185* 155* 236*  < > = values in this interval not displayed.   Imaging/Diagnostic Tests: 10/2 CXR: Cardiomegaly with left hemidiaphragm elevation and adjacent volume loss/atelectasis.   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  11/4 DG R Foot: OA of the first MTP joint. No acute findings     [name], DO 01/28/2016, 6:57 AM PGY-1, [place] Intern pager: [phone], text pages welcome

## 2016-01-28 NOTE — Progress Notes (Addendum)
[facility] Progress Note  Subjective   Eating breakfast. No complaints.   Objective Vitals:   01/27/16 2123 01/28/16 0521  BP: 124/66 130/65  Pulse: 75 79  Resp: 18 17  Temp: 97.6 F (36.4 C) 97.6 F (36.4 C)    Intake/Output Summary (Last 24 hours) at 01/28/16 0850 Last data filed at 01/28/16 0600  Gross per 24 hour  Intake             1571 ml  Output             2500 ml  Net             -929 ml   Right heel wound clean. Left AKA medial open wound clean with pink granulation tissue.   Assessment/Planning: 75 y.o. male is s/p: left AKA with open wound to medial aspect, right heel wound  Wounds appear clean. May be ready for wound VAC. Will d/w Dr. Scot Dock.  Right foot x-ray negative for osteo.  WBC improved to 11.7. Afebrile.   Alvia Grove 01/28/2016 8:50 AM --  Laboratory CBC    Component Value Date/Time   WBC 11.7 (H) 01/28/2016 0623   HGB 10.5 (L) 01/28/2016 0623   HCT 33.5 (L) 01/28/2016 0623   PLT 280 01/28/2016 0623    BMET    Component Value Date/Time   NA 138 01/28/2016 0623   NA 136 (A) 07/26/2015   K 3.8 01/28/2016 0623   CL 103 01/28/2016 0623   CO2 25 01/28/2016 0623   GLUCOSE 193 (H) 01/28/2016 0623   BUN 12 01/28/2016 0623   BUN 37 (A) 07/26/2015   CREATININE 0.73 01/28/2016 0623   CREATININE 1.11 06/14/2013 1221   CALCIUM 8.5 (L) 01/28/2016 0623   GFRNONAA >60 01/28/2016 0623   GFRAA >60 01/28/2016 0623    COAG Lab Results  Component Value Date   INR 1.58 01/24/2016   INR 1.39 10/19/2015   INR 1.7 (A) 08/13/2015   PROTIME 19.9 (A) 08/13/2015   PROTIME 35.5 (A) 08/01/2015   PROTIME 21.0 (A) 07/09/2015   No results found for: PTT  Antibiotics Anti-infectives    Start     Dose/Rate Route Frequency Ordered Stop   01/28/16 1000  vancomycin (VANCOCIN) 1,250 mg in sodium chloride 0.9 % 250 mL IVPB     1,250 mg 125 mL/hr over 120 Minutes Intravenous Every 24 hours 01/28/16 0842     01/25/16 0900   vancomycin (VANCOCIN) 1,250 mg in sodium chloride 0.9 % 250 mL IVPB  Status:  Discontinued     1,250 mg 125 mL/hr over 120 Minutes Intravenous Every 12 hours 01/24/16 1815 01/27/16 1030   01/25/16 0300  piperacillin-tazobactam (ZOSYN) IVPB 3.375 g  Status:  Discontinued     3.375 g 12.5 mL/hr over 240 Minutes Intravenous Every 8 hours 01/24/16 1814 01/24/16 2205   01/25/16 0100  piperacillin-tazobactam (ZOSYN) IVPB 3.375 g     3.375 g 12.5 mL/hr over 240 Minutes Intravenous Every 8 hours 01/24/16 2205     01/24/16 1800  piperacillin-tazobactam (ZOSYN) IVPB 3.375 g     3.375 g 100 mL/hr over 30 Minutes Intravenous  Once 01/24/16 1746 01/24/16 1843   01/24/16 1800  vancomycin (VANCOCIN) 2,000 mg in sodium chloride 0.9 % 500 mL IVPB     2,000 mg 125 mL/hr over 240 Minutes Intravenous  Once 01/24/16 1747 01/25/16 0000   01/24/16 0300  piperacillin-tazobactam (ZOSYN) IVPB 3.375 g  Status:  Discontinued  3.375 g 12.5 mL/hr over 240 Minutes Intravenous Every 8 hours 01/24/16 1814 01/24/16 1814       Virgina Jock, PA-C [facility] Office: [phone] Pager: [phone] 01/28/2016 8:50 AM  I have independently interviewed patient and agree with PA assessment and plan above. Ok for left aka site vac. No intervention on right unless necessary as only option would be aka.   Brandon C. Donzetta Matters, MD [facility] [place] Office: [phone] Pager: [phone]

## 2016-01-28 NOTE — Consult Note (Signed)
[place] Nurse wound consult note Reason for Consult: apply NPWT to the left stump wounds Wound type:surgical Measurement: Left AKA stump medial wound: 6cm x 5cm x with tunneling at 3 o'clock that is 3.5cm  Left AKA stump lateral wound: 2cm x 7cm x 1.0cm with undermine at 3-4 o'clock 2.5cm  Wound bed: both wounds are clean, the medial wound is dry, non granular; the lateral wound is clean 95% pink/5% yellow Drainage (amount, consistency, odor) minimal  Periwound: intact, however he has two small open areas on the medial thigh, aprox. 1.0cm deep tunnels; dorsal thigh is 75% pink/25% yellow, partial thickness area, unsure of etiology Dressing procedure/placement/frequency: Silicone foam to the most medial thigh wound, and to the dorsal thigh wound. Change every 3 days and PRN soilage.  NPWT drape used to protect the skin between the two open stump wounds. 1pc of black foam used to fill lateral wound, 1pc of black foam used to fill the medial wound.  1pc of black foam used to bridge the two wounds. Seal at 138mmHG.  Patient tolerated well.  WOC will follow along for M/W/F VAC dressing changes due to complexity of dressing.  Tehilla Coffel [facility] MSN, [place], [place], [place]

## 2016-01-28 NOTE — Progress Notes (Signed)
Inpatient Diabetes Program Recommendations  AACE/ADA: New Consensus Statement on Inpatient Glycemic Control (2015)  Target Ranges:  Prepandial:   less than 140 mg/dL      Peak postprandial:   less than 180 mg/dL (1-2 hours)      Critically ill patients:  140 - 180 mg/dL   Lab Results  Component Value Date   GLUCAP 171 (H) 01/28/2016   HGBA1C 6.4 (H) 11/01/2015    Review of Glycemic Control  Results for [name], [name] (MRN [id number]) as of 01/28/2016 12:43  Ref. Range 01/26/2016 20:46 01/27/2016 08:12 01/27/2016 11:59 01/27/2016 17:18 01/27/2016 21:19 01/28/2016 07:31 01/28/2016 11:26  Glucose-Capillary Latest Ref Range: 65 - 99 mg/dL 167 (H) 167 (H) 171 (H) 190 (H) 183 (H) 239 (H) 171 (H)    Diabetes history: Type 2  Outpatient Diabetes medications: NPH 20 units bid, Humalog 8-13 units with meals  Current orders for Inpatient glycemic control:  Novolog 0-5 units qhs, Novolog moderate correction 0-15 units tid.  Inpatient Diabetes Program Recommendations:   Agree with current medications for blood sugar management.    Gentry Fitz, RN, BA, MHA, CDE Diabetes Coordinator Inpatient Diabetes Program  [phone] (Team Pager) [phone] ([place]) 01/28/2016 12:48 PM

## 2016-01-28 NOTE — Progress Notes (Signed)
[place] for heparin Indication: history of PE  Allergies  Allergen Reactions  . Diazepam Anxiety    REACTION: makes patient cry  . Vancomycin Other (See Comments)    "red man syndrome"    Patient Measurements: Height: 5\' 3"  (160 cm) Weight: 245 lb 6 oz (111.3 kg) IBW/kg (Calculated) : 56.9 Heparin Dosing Weight: 88 kg  Vital Signs: Temp: 97.6 F (36.4 C) (11/06 0521) Temp Source: Oral (11/06 0521) BP: 130/65 (11/06 0521) Pulse Rate: 79 (11/06 0521)  Labs:  Recent Labs  01/26/16 0420  01/26/16 0430 01/26/16 1455 01/27/16 0534 01/27/16 0900 01/28/16 0623  HGB  --   < > 11.5*  --  10.9*  --  10.5*  HCT  --   --  37.4*  --  35.2*  --  33.5*  PLT  --   --  313  --  316  --  280  APTT  --   --  64* 71* 84*  --   --   HEPARINUNFRC 0.33  --   --   --  0.42  --  0.35  CREATININE  --   --  0.64  --   --  0.74 0.73  < > = values in this interval not displayed.  Estimated Creatinine Clearance: 86.1 mL/min (by C-G formula based on SCr of 0.73 mg/dL).   Assessment: 75 yo male on Xarelto PTA for history of PE, currently on hold and continuing on heparin infusion. Last dose of Xarelto on 11/1 at unknown time. Heparin level therapeutic and aPTTs correlating, so only checking heparin levels now. Level remains therapeutic this morning at 0.35units/mL. Hgb low, but stable, plts remain in range, no S/Sx bleeding noted.  Goal of Therapy:  Heparin level 0.3-0.7 units/ml Monitor platelets by anticoagulation protocol: Yes   Plan:  -Continue heparin at 2000 units/hr -Daily heparin level and CBC -Monitor for s/sx bleeding  [place] Schara D. Nicolas Banh, PharmD, BCPS Clinical Pharmacist Pager: [phone] 01/28/2016 8:36 AM

## 2016-01-28 NOTE — Progress Notes (Signed)
Pharmacy Antibiotic Note [name] is a 75 y.o. male admitted on 01/24/2016 with right leg cellulitis. Pharmacy has been consulted for Zosyn and vancomycin dosing.   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   A Vancomycin trough was elevated over the weekend and vancomycin doses were held. A random level checked this morning was 55mcg/mL which is still slightly higher than goal. SCr has been 0.6-0.9 during this admission- currently 0.74 with normalized CrCl ~72mL/min.  Plan: -Continue Zosyn 3.375 grams IV q8h (infused over 4 hours) -Resume vancomycin at 1250mg  IV q24h starting at 1000. Will give dose over 12h to reduce risk of Red Man. -Goal trough 10-65mcg/mL as shock has resolved and treating cellulitis. -Monitor SCr and UOP closely -Follow-up culture data, length of therapy and surgical plans   Height: 5\' 3"  (160 cm) Weight: 245 lb 6 oz (111.3 kg) IBW/kg (Calculated) : 56.9  Temp (24hrs), Avg:97.7 F (36.5 C), Min:97.6 F (36.4 C), Max:97.9 F (36.6 C)   Recent Labs Lab 01/24/16 1709 01/24/16 1719 01/24/16 2036  01/25/16 0254 01/25/16 1800 01/26/16 0430 01/27/16 0534 01/27/16 0900 01/28/16 0623  WBC 35.1*  --   --   --  26.4*  --  31.6* 19.3*  --  11.7*  CREATININE 0.99  --   --   < > 0.92 0.74 0.64  --  0.74 0.73  LATICACIDVEN  --  1.90 1.57  --   --   --   --   --   --   --   VANCOTROUGH  --   --   --   --   --   --   --   --  22*  --   VANCORANDOM  --   --   --   --   --   --   --   --   --  18  < > = values in this interval not displayed.  Estimated Creatinine Clearance: 86.1 mL/min (by C-G formula based on SCr of 0.73 mg/dL).    Allergies  Allergen Reactions  . Diazepam Anxiety    REACTION: makes patient cry  . Vancomycin Other (See Comments)    "red man syndrome"    Antimicrobials this admission:  11/2 Zosyn >>  11/2 Vancomycin >>   Dose  adjustments this admission:  11/5 VT = 33 (on 1250mg  IV q12h) 11/6 VR = 18  Microbiology results:  11/2 BCx: NGTD 11/2 UCx: Neg 11/2 MRSA PCR: pos  Thank you for allowing pharmacy to be a part of this patient's care.  Maybree Riling D. Hyden Soley, PharmD, BCPS Clinical Pharmacist Pager: [phone] 01/28/2016 8:40 AM

## 2016-01-28 NOTE — Progress Notes (Signed)
Physical Therapy Wound Treatment Patient Details  Name: [name] MRN: [id number] Date of Birth: 09-28-1940  Today's Date: 01/28/2016 Time: 0925-1005 Time Calculation (min): 40 min  Subjective  Subjective: Pt agreeable to hydrotherapy. Difficult to understand at times Patient and Family Stated Goals: To heal wounds Date of Onset: 08/23/15 Prior Treatments: Wound care center, nursing care at SNF.  Pain Score:  Pt appears painful during session   Wound Assessment  Dressing Type ABD;Barrier Film (skin prep);Gauze (Comment);Moist to dry 01/28/2016 10:16 AM  Dressing Clean;Dry;Intact 01/28/2016 10:16 AM  Dressing Change Frequency Daily 01/28/2016 10:16 AM  State of Healing Eschar 01/28/2016 10:16 AM  Site / Wound Assessment Black;Yellow;Pink 01/28/2016 10:16 AM  % Wound base Red or Granulating 5% 01/28/2016 10:16 AM  % Wound base Yellow 80% 01/28/2016 10:16 AM  % Wound base Black 15% 01/28/2016 10:16 AM  % Wound base Other (Comment) 0% 01/28/2016 10:16 AM  Peri-wound Assessment Intact;Erythema (blanchable) 01/28/2016 10:16 AM  Wound Length (cm) 3.7 cm 01/26/2016  1:00 PM  Wound Width (cm) 2.1 cm 01/26/2016  1:00 PM  Wound Depth (cm) 0 cm 01/26/2016  1:00 PM  Margins Unattached edges (unapproximated) 01/28/2016 10:16 AM  Drainage Amount Minimal 01/28/2016 10:16 AM  Drainage Description Serosanguineous 01/28/2016 10:16 AM  Treatment Debridement (Selective);Hydrotherapy (Pulse lavage);Packing (Saline gauze) 01/28/2016 10:16 AM     Wound / Incision (Open or Dehisced) 01/24/16 Incision - Dehisced;Other (Comment) Leg Left Open areas of old surgical site (L-AKA) (Active)  Dressing Type ABD;Barrier Film (skin prep);Gauze (Comment);Moist to dry 01/28/2016 10:16 AM  Dressing Changed Changed 01/28/2016 10:16 AM  Dressing Status Clean;Dry;Intact 01/28/2016 10:16 AM  Dressing Change Frequency Daily 01/28/2016 10:16 AM  Site / Wound Assessment Pink;Yellow 01/28/2016 10:16 AM  % Wound base Red or Granulating 80%  01/28/2016 10:16 AM  % Wound base Yellow 20% 01/28/2016 10:16 AM  % Wound base Other (Comment) 20% 01/26/2016  1:00 PM  Peri-wound Assessment Intact;Erythema (blanchable) 01/28/2016 10:16 AM  Wound Length (cm) 6.8 cm 01/26/2016  1:00 PM  Wound Width (cm) 4.5 cm 01/26/2016  1:00 PM  Wound Depth (cm) 1.6 cm 01/26/2016  1:00 PM  Undermining (cm) 3.7 01/26/2016  1:00 PM  Margins Unattached edges (unapproximated) 01/28/2016 10:16 AM  Closure None 01/28/2016 10:16 AM  Drainage Amount Minimal 01/28/2016 10:16 AM  Drainage Description Serosanguineous 01/28/2016 10:16 AM  Non-staged Wound Description Full thickness 01/28/2016 10:16 AM  Treatment Debridement (Selective);Hydrotherapy (Pulse lavage);Packing (Saline gauze) 01/28/2016 10:16 AM     Wound / Incision (Open or Dehisced) 01/26/16 Incision - Dehisced Thigh Left;Lateral open area dehisced incision from AKA (Active)  Dressing Type ABD;Barrier Film (skin prep);Gauze (Comment);Moist to dry 01/28/2016 10:16 AM  Dressing Changed Changed 01/28/2016 10:16 AM  Dressing Status Clean;Dry;Intact 01/28/2016 10:16 AM  Dressing Change Frequency Daily 01/28/2016 10:16 AM  Site / Wound Assessment Pink;Yellow 01/28/2016 10:16 AM  % Wound base Red or Granulating 20% 01/28/2016 10:16 AM  % Wound base Yellow 80% 01/28/2016 10:16 AM  % Wound base Black 0% 01/26/2016  1:00 PM  % Wound base Other (Comment) 0% 01/26/2016  1:00 PM  Peri-wound Assessment Intact;Erythema (blanchable) 01/28/2016 10:16 AM  Wound Length (cm) 4.4 cm 01/26/2016  1:00 PM  Wound Width (cm) 2 cm 01/26/2016  1:00 PM  Wound Depth (cm) 1.2 cm 01/26/2016  1:00 PM  Margins Unattached edges (unapproximated) 01/28/2016 10:16 AM  Closure None 01/28/2016 10:16 AM  Drainage Amount Minimal 01/28/2016 10:16 AM  Drainage Description Serosanguineous 01/28/2016 10:16 AM  Non-staged  Wound Description Full thickness 01/28/2016 10:16 AM  Treatment Debridement (Selective);Hydrotherapy (Pulse lavage);Packing (Saline gauze) 01/28/2016 10:16  AM   Hydrotherapy Pulsed lavage therapy - wound location: R lateral heel; L medial and lateral wounds AKA resid limb Pulsed Lavage with Suction (psi): 8 psi (4 at times due to pain) Pulsed Lavage with Suction - Normal Saline Used: 1000 mL Pulsed Lavage Tip: Tip with splash shield Selective Debridement Selective Debridement - Location: lateral aspect of L AKA and lateral R heel Selective Debridement - Tools Used: Forceps;Scissors Selective Debridement - Tissue Removed: yellow nonviable tissue   Wound Assessment and Plan  Wound Therapy - Assess/Plan/Recommendations Wound Therapy - Clinical Statement: Patient presents with decreased wound healing due to immobility, poor blood flow and decreased nutrition with polypharmacy.  He will benefit from skilled PT for hydrotherapy to remove devitalized tissue and improve healthy granulation for wound healing.  Wound Therapy - Functional Problem List: immobility, decreased blood flow, decreased nutrition, polypharmacy. Factors Delaying/Impairing Wound Healing: Altered sensation;Diabetes Mellitus;Immobility;Multiple medical problems;Polypharmacy;Vascular compromise;Infection - systemic/local Hydrotherapy Plan: Debridement;Dressing change;Patient/family education;Pulsatile lavage with suction Wound Therapy - Frequency: 6X / week Wound Therapy - Current Recommendations: Case manager/social work;Nutritionist Wound Therapy - Follow Up Recommendations: Skilled nursing facility Wound Plan: see above  Wound Therapy Goals- Improve the function of patient's integumentary system by progressing the wound(s) through the phases of wound healing (inflammation - proliferation - remodeling) by: Decrease Necrotic Tissue to: by 10% Decrease Necrotic Tissue - Progress: Progressing toward goal Increase Granulation Tissue to: by 10% Increase Granulation Tissue - Progress: Progressing toward goal Goals/treatment plan/discharge plan were made with and agreed upon by  patient/family: Yes Time For Goal Achievement: 2 weeks Wound Therapy - Potential for Goals: Fair  Goals will be updated until maximal potential achieved or discharge criteria met.  Discharge criteria: when goals achieved, discharge from hospital, MD decision/surgical intervention, no progress towards goals, refusal/missing three consecutive treatments without notification or medical reason.  GP     Rolinda Roan 01/28/2016, 10:31 AM   Rolinda Roan, PT, DPT Acute Rehabilitation Services Pager: [phone]

## 2016-01-29 LAB — PHOSPHORUS: PHOSPHORUS: 3.4 mg/dL (ref 2.5–4.6)

## 2016-01-29 LAB — CBC
HCT: 34.5 % — ABNORMAL LOW (ref 39.0–52.0)
Hemoglobin: 10.4 g/dL — ABNORMAL LOW (ref 13.0–17.0)
MCH: 23.2 pg — ABNORMAL LOW (ref 26.0–34.0)
MCHC: 30.1 g/dL (ref 30.0–36.0)
MCV: 76.8 fL — ABNORMAL LOW (ref 78.0–100.0)
PLATELETS: 292 10*3/uL (ref 150–400)
RBC: 4.49 MIL/uL (ref 4.22–5.81)
RDW: 16.4 % — AB (ref 11.5–15.5)
WBC: 12.5 10*3/uL — AB (ref 4.0–10.5)

## 2016-01-29 LAB — GLUCOSE, CAPILLARY
GLUCOSE-CAPILLARY: 160 mg/dL — AB (ref 65–99)
Glucose-Capillary: 143 mg/dL — ABNORMAL HIGH (ref 65–99)
Glucose-Capillary: 208 mg/dL — ABNORMAL HIGH (ref 65–99)

## 2016-01-29 LAB — CULTURE, BLOOD (ROUTINE X 2): CULTURE: NO GROWTH

## 2016-01-29 MED ORDER — DOXYCYCLINE HYCLATE 100 MG PO TABS: 100.0000 mg | ORAL_TABLET | Freq: Two times a day (BID) | ORAL | 0 refills | Status: DC

## 2016-01-29 MED ORDER — PREDNISONE 10 MG PO TABS
10.0000 mg | ORAL_TABLET | Freq: Two times a day (BID) | ORAL | Status: AC
  Administered 2016-01-29 (×2): 10 mg via ORAL
  Filled 2016-01-29 (×2): qty 1

## 2016-01-29 MED ORDER — PREDNISONE 10 MG PO TABS: 10.0000 mg | ORAL_TABLET | Freq: Two times a day (BID) | ORAL | 0 refills | Status: DC

## 2016-01-29 NOTE — Discharge Instructions (Signed)
You were admitted to the ICU for sepsis secondary to your chronic ulcers leading to an infection. You were able to get off the medications to stabilize your blood pressure and you came to the main floor. Vascular and wound care followed and provided you with a wound VAC. You infection greatly improved after antibiotics. You will continue taking doxycycline 100 mg twice per day through 02/05/2016. You will also need to continue prednisone taper and will take two 10 mg tablets per day for today and tomorrow, then take one 10 mg tablet for 3 days.

## 2016-01-29 NOTE — Progress Notes (Signed)
Family Medicine Teaching Service Daily Progress Note Intern Pager: [phone]  Patient name: [name] Medical record number: [id number] Date of birth: 1940-12-27 Age: 75 y.o. Gender: male  Primary Care Provider: [name], MD Consultants: vascular surgery  Code Status: Partial (only ACLS medications and NIPPV/BiPap)  Pt Overview and Major Events to Date:  11/02: admitted to [facility] due to septic shock from RLE cellulitis. No lactic acidosis. Started on Levo and stress dose steroids. Started on Vanc and Zosyn  11/03: Off Levophed @1100 . PICC placed.  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  presented with fevers and lethargy found to have septic shock due to RLE cellulitis initially requiring pressor support in the ICU. Transferred to [name] on 11/5.    #Septic Shock, Acute, Resolved, Likely 2/2 to RLE Cellulitis:  Leukocytosis improving. X-ray of R foot without signs of osteo. Vitals are stable. Erythema has improved and within margins. --Completed Vancomycin and Zosyn (day 5;4 respectively), transitioned to PO doxycycline 100 mg BID given improvement (day 7 of 14 for abx) --Blood Cx: NG x 3 days  --Urine Cx: NG final  --Vascular surgery consulted, appreciate recs  --Transitioned to Prednisone 10 mg BID 11/07, s/p Solu-cortef (started 11/3>), will continue to wean --Norco 10-325 mg for pain, last given 11/06 @1239   #Chronic Wounds, Stable:  Poorly heading wounds of L stump. Wound on R heel. Wound VAC per vascular recs.  --Vascular following, appreciate recs --Wound care following, appreciate recs  #Chronic PE:  On xarelto at SNF.  --Xarelto 20 mg QD  #Chronic Systolic HF, CAD, HTN:  ECHO 09/2015 with EF 30-35%,  severe hypokinesis of the inferior and inferolateral walls, G2DD. Home meds: ASA, Lipitor, Coreg 3.125mg  BID, Lasix 80mg  BID --Monitor fluid status  --Lasix 80 mg PO BID  #DM2 with PVD:  A1c 6.4 (10/2015). On NPH 20U BID and Humalog sliding scale at SNF. CBGs stable.  --ACHS SSI   #L BKA:  Home meds: Neurontin 600mg  TID and  Robaxin 500mg  TID  --Gabapentin 600mg  TID (restarted today)   #Chronic COPD:  --Per chart review, patient declined medications as outpatient --Albuterol q2h PRN   #GERD:  Prilosec 40mg  daily at SNF --Pepcid 20mg  BID  #Anxiety/Depression:  On Ativan PRN, Trazodone 100mg  at bedtime, Cymbalta 90mg  daily at home.  --Considered restarting Cymbalta, but initial EKG with QTc 546 (shorter on manual calculation), will repeat EKG to check QT prior to starting Cymbalta  --On home Ativan 0.5 mg PO BID PRN anxiety  #Chronic Constipation:  On Senokot and Miralax daily at SNF. Having BMs without medications.  --Holding scheduled home meds   #BPH:  On Flomax at SNF. Currently has urethral catheter  FEN/GI: Heart healthy/carb modified  PPx: Xarelto  Disposition: anticipate d/c back to SNF today given improvement of cellulitis  Subjective:  No overnight events. Patient says he feels better this AM. Denies pain or dyspnea. Says he feels comfortable returning to his SNF.  Objective: Temp:  [97.4 F (36.3 C)-97.8 F (36.6 C)] 97.6 F (36.4 C) (11/07 0631) Pulse Rate:  [70-87] 87 (11/07 0903) Resp:  [17-18] 17 (11/07 0631) BP: (111-133)/(49-76) 133/64 (11/07 0903) SpO2:  [94 %-99 %] 98 % (11/07 0631) Weight:  [241 lb 6.5 oz (109.5 kg)] 241  lb 6.5 oz (109.5 kg) (11/06 2020) Physical Exam: General: NAD, alert and awake, on [name] 3 L Cardiovascular: RRR, no m/r/b Respiratory: On [place], normal wob, diminished breath sounds at bases bilaterally but no crackles or wheezing noted  Abdomen: abdominal obesity, non-tender, + BS Extremities: RLE with erythema within the margins, L  AKA wound VAC with bandage dry and clean, PICC line in R upper arm       Laboratory:  Recent Labs Lab 01/27/16 0534 01/28/16 0623 01/29/16 0500  WBC 19.3* 11.7* 12.5*  HGB 10.9* 10.5* 10.4*  HCT 35.2* 33.5* 34.5*  PLT 316 280 292    Recent Labs Lab 01/24/16 1709  01/26/16 0430 01/27/16 0900 01/28/16 0623  NA 131*  < > 137 137 138  K 2.9*  < > 4.5 3.8 3.8  CL 91*  < > 102 103 103  CO2 29  < > 25 26 25   BUN 17  < > 13 11 12   CREATININE 0.99  < > 0.64 0.74 0.73  CALCIUM 8.7*  < > 8.5* 8.6* 8.5*  PROT 6.2*  --   --   --   --   BILITOT 0.9  --   --   --   --   ALKPHOS 81  --   --   --   --   ALT 18  --   --   --   --   AST 21  --   --   --   --   GLUCOSE 181*  < > 155* 236* 193*  < > = values in this interval not displayed.   Imaging/Diagnostic Tests: 10/2 CXR: Cardiomegaly with left hemidiaphragm elevation and adjacent volume loss/atelectasis.   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  11/4 DG R Foot: OA of the first MTP joint. No acute findings     [place] Bing, DO 01/29/2016, 9:23 AM PGY-1, [name] Intern pager: [phone], text pages welcome

## 2016-01-29 NOTE — Care Management Important Message (Signed)
Important Message  Patient Details  Name: [name] MRN: [id number] Date of Birth: [birth year]   Medicare Important Message Given:  Yes  Pt unable to sign  RoyalRory Percy, RN 01/29/2016, 2:50 PM

## 2016-01-29 NOTE — Evaluation (Signed)
Physical Therapy Evaluation Patient Details Name: [name] MRN: [id number] DOB: 11-05-1940 Today's Date: 01/29/2016   History of Present Illness  75 year old male and nursing home resident [facility]) with PMH recent L AKA 10/2015 due to wet gangrene, Chronic PE on xarelto, HFrEF, CAD, HTN, DM2, PVD, COPD, Anxiety/Depression, BPH.  presented with fevers and lethargy found to have septic shock due to RLE cellulitis initially requiring pressor support in the ICU.   VAC placed on the L stump due to decreased healing  Clinical Impression  Pt admitted with/for sepsis from cellulitis.  Pt currently limited functionally due to the problems listed below.  (see problems list.)  Pt will benefit from rehab at snf level to work on strengthening and assisting with transfers to w/c and bsc.     Follow Up Recommendations SNF;Other (comment) (working on strengthening, scoot and squat transfers)    Equipment Recommendations  None recommended by PT    Recommendations for Other Services       Precautions / Restrictions Precautions Precautions: Fall      Mobility  Bed Mobility Overal bed mobility: Needs Assistance Bed Mobility: Supine to Sit;Sit to Supine     Supine to sit: Mod assist Sit to supine: Min guard   General bed mobility comments: light mod to get to EOB and no assist to supine.  pt assisted with rails to boost up in bed.  Transfers Overall transfer level: Needs assistance               General transfer comment: pt deferred attempts to stand today.  Ambulation/Gait             General Gait Details: NA  Stairs            Wheelchair Mobility    Modified Rankin (Stroke Patients Only)       Balance Overall balance assessment: Needs assistance Sitting-balance support: No upper extremity supported Sitting balance-[name]: Good Sitting balance - Comments: resisted moderate challenge to balance without falling back or to sides                                      Pertinent Vitals/Pain Pain Assessment: Faces Faces Pain Scale: Hurts little more Pain Location: R leg Pain Descriptors / Indicators: Discomfort;Grimacing Pain Intervention(s): Monitored during session;Repositioned    Home Living Family/patient expects to be discharged to:: Skilled nursing facility                 Additional Comments: Pt is from SNF where he has resided for the past year.    Prior Function Level of Independence: Needs assistance   Gait / Transfers Assistance Needed: uses lift to w/c due to taking 2 person  assist to stand and pivot  ADL's / Homemaking Assistance Needed: Dependent with ADLs.        Hand Dominance   Dominant Hand: Right    Extremity/Trunk Assessment   Upper Extremity Assessment: LUE deficits/detail;RUE deficits/detail RUE Deficits / Details: R shoulder ROM flex/abd limited to 75*, painful and weak; gross flexion 4-/5, gross extension 3+ to 4-/5     LUE Deficits / Details: functional overall and grossly 4+/5   Lower Extremity Assessment: RLE deficits/detail;LLE deficits/detail RLE Deficits / Details: grossly >=3/5 LLE Deficits / Details: Moves AKA well against gravity, ab/add, but does not use it for scooting     Communication   Communication: [facility]  Cognition Arousal/Alertness: Awake/alert Behavior During Therapy: WFL for tasks assessed/performed Overall Cognitive Status: Within Functional Limits for tasks assessed                      General Comments      Exercises     Assessment/Plan    PT Assessment Patient needs continued PT services  PT Problem List Decreased strength;Decreased activity tolerance;Decreased balance;Decreased mobility;Decreased coordination;Decreased knowledge of use of DME          PT Treatment Interventions      PT Goals (Current goals can be found in the Care Plan section)  Acute Rehab PT Goals Patient Stated Goal: be able to transfer PT Goal  Formulation: All assessment and education complete, DC therapy    Frequency     Barriers to discharge        Co-evaluation               End of Session   Activity Tolerance: Patient tolerated treatment well Patient left: in bed;with call bell/phone within reach Nurse Communication: Mobility status         Time: [id number] PT Time Calculation (min) (ACUTE ONLY): 23 min   Charges:   PT Evaluation $PT Eval Low Complexity: 1 Procedure PT Treatments $Therapeutic Activity: 8-22 mins   PT G Codes:        [place] Capers, Tessie Fass 01/29/2016, 3:47 PM 01/29/2016  Donnella Sham, [place] [phone]  (pager)

## 2016-01-29 NOTE — Progress Notes (Signed)
  Progress Note    01/29/2016 11:41 AM * No surgery found *  Subjective:  No complaints this a.m  Vitals:   01/29/16 0903 01/29/16 0944  BP: 133/64   Pulse: 87   Resp:  17  Temp:  98 F (36.7 C)    Physical Exam: aaox3 R foot dressing cdi without surrounding erythema Left aka site with vac to suction  CBC    Component Value Date/Time   WBC 12.5 (H) 01/29/2016 0500   RBC 4.49 01/29/2016 0500   HGB 10.4 (L) 01/29/2016 0500   HCT 34.5 (L) 01/29/2016 0500   PLT 292 01/29/2016 0500   MCV 76.8 (L) 01/29/2016 0500   MCH 23.2 (L) 01/29/2016 0500   MCHC 30.1 01/29/2016 0500   RDW 16.4 (H) 01/29/2016 0500   LYMPHSABS 1.4 01/24/2016 1709   MONOABS 1.8 (H) 01/24/2016 1709   EOSABS 0.0 01/24/2016 1709   BASOSABS 0.0 01/24/2016 1709    BMET    Component Value Date/Time   NA 138 01/28/2016 0623   NA 136 (A) 07/26/2015   K 3.8 01/28/2016 0623   CL 103 01/28/2016 0623   CO2 25 01/28/2016 0623   GLUCOSE 193 (H) 01/28/2016 0623   BUN 12 01/28/2016 0623   BUN 37 (A) 07/26/2015   CREATININE 0.73 01/28/2016 0623   CREATININE 1.11 06/14/2013 1221   CALCIUM 8.5 (L) 01/28/2016 0623   GFRNONAA >60 01/28/2016 0623   GFRAA >60 01/28/2016 0623    INR    Component Value Date/Time   INR 1.58 01/24/2016 2330     Intake/Output Summary (Last 24 hours) at 01/29/16 1141 Last data filed at 01/29/16 0841  Gross per 24 hour  Intake          1325.33 ml  Output             1300 ml  Net            25.33 ml     Assessment:  75 y.o. male is left aka with wound breakdown that has wound vac placed, right heel ulcer without reconstructable vascular disease.  Plan: Vac change per wound care nursing No options for revascularization of right foot and would need amputation if wound worsens, for now keep pressure offloaded   Naomia Lenderman C. Donzetta Matters, MD [facility] Office: [phone] Pager: [phone]  01/29/2016 11:41 AM

## 2016-01-29 NOTE — Discharge Summary (Signed)
[name] Discharge Summary  Patient name: [name] Medical record number: [id number] Date of birth: June 28, 1940 Age: 75 y.o. 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 Chronic COPD Anxiety/Depression BPH  Disposition: SNF  Discharge Condition: Stable, improved  Discharge Exam:  General: NAD, alert and awake, on [place] 3 L Cardiovascular: RRR, no m/r/b Respiratory: On Rose, normal wob, diminished breath sounds at bases bilaterally but no crackles or wheezing noted  Abdomen: abdominal obesity, non-tender, + BS Extremities: RLE with erythema within the margins, L AKA wound VAC with bandage dry and clean, PICC line in R upper arm   B[name] Course:  75 year old male and nursing home resident [name]) with PMH 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  presented with fevers and lethargy found to have septic shock due to RLE cellulitis initially requiring pressor support in the ICU. Transferred to [place] on 11/5.  Patient was admitted to the ICU for sepsis after experiencing fevers at his SNF on 11/02. He was noted to have erythema of his right lower extremity concerning for cellulitis. He was also noted to have opened chronic wounds on his left AKA. Patient arrived hypotensive and was given fluid bolus, broad spectrum IV abx, steroids, and pressors were initiated. Patient was transitioned out of the ICU on 11/05 after pressors were taken off. Vascular and wound care followed patients care and provided [facility] for left AKA  and dressings for right heel. Cellulitis of right leg showed improvement and patient was transitioned to doxycycline 11/06. Steroids were tapered during admission. Patient weaned to home dose, 2 L [place]. Remained afebrile and BCx were neg.   Issues for Follow Up:  1. Complete prednisone taper for 5 more days for a total of 9 days of steroids 2. Complete doxycycline 100 mg BID for 8 more days concerning improving RL AKA cellulitis for a total of 14 days of abx finishing on 11/14 3. Wound care to follow right LE heel and VAC for left AKA 4. Will need to check CBC concerning leukocytosis which improved during hospitalization  Significant Procedures: None  Significant Labs and Imaging:   Recent Labs Lab 01/27/16 0534 01/28/16 0623 01/29/16 0500  WBC 19.3* 11.7* 12.5*  HGB 10.9* 10.5* 10.4*  HCT 35.2* 33.5* 34.5*  PLT 316 280 292    Recent Labs Lab 01/24/16 1709  01/25/16 0254 01/25/16 1800 01/26/16 0430 01/27/16 0534 01/27/16 0900 01/28/16 0623 01/29/16 0500  NA 131*  < > 140 138 137  --  137 138  --   K 2.9*  < > 3.0* 4.8 4.5  --  3.8 3.8  --   CL 91*  < > 99* 103 102  --  103 103  --   CO2 29  < > 26 26 25   --  26 25  --   GLUCOSE 181*  < > 156* 185* 155*  --  236* 193*  --   BUN 17  < > 14 14 13   --  11 12  --   CREATININE 0.99  < > 0.92 0.74 0.64  --  0.74 0.73  --   CALCIUM 8.7*  < > 7.6* 8.8* 8.5*  --  8.6* 8.5*  --   MG  --   --  1.6*  --  2.3  --   --   --   --   PHOS  --   --  6.3*  --  3.3 2.7  --  2.9 3.4  ALKPHOS 81  --   --   --   --   --   --   --   --   AST 21  --   --   --   --   --   --   --   --   ALT 18  --   --   --   --   --   --   --   --   ALBUMIN 2.7*  --   --   --   --   --   --   --   --   < > = values in this interval not displayed.  Results/Tests Pending at Time of Discharge: None  Discharge Medications:    Medication List    STOP taking these medications   DECUBI-VITE PO     TAKE these medications   acetaminophen 325 MG  tablet Commonly known as:  TYLENOL Take 650 mg by mouth every 6 (six) hours as needed for fever.   aspirin 81 MG EC tablet Take 1 tablet (81 mg total) by mouth daily.   atorvastatin 20 MG tablet Commonly known as:  LIPITOR Take 20 mg by mouth daily.   bisacodyl 10 MG suppository Commonly known as:  DULCOLAX Place 1 suppository (10 mg total) rectally daily as needed for moderate constipation.   CALAZIME SKIN PROTECTANT EX Apply topically. Apply to sacrum and right buttock every day shift   carvedilol 3.125 MG tablet Commonly known as:  COREG Take 1 tablet (3.125 mg total) by mouth 2 (two) times daily with a meal.   collagenase ointment Commonly known as:  SANTYL Apply 1 application topically daily. Apply Santyl to left and right heel wounds Q day, then cover with moist gauze and dry kerlex   docusate sodium 100 MG capsule Commonly known as:  COLACE Take 100 mg by mouth 2 (two) times daily.   doxycycline 100 MG tablet Commonly known as:  VIBRA-TABS Take 1 tablet (100 mg total) by mouth 2 (two) times daily.   DULoxetine 30 MG capsule Commonly known as:  CYMBALTA Take 90 mg by mouth daily.   feeding supplement (PRO-STAT SUGAR FREE 64) Liqd Take 30 mLs by mouth 2 (two) times daily. What changed:  how much to take  when to take this   ferrous sulfate 325 (65 FE) MG tablet Take 1 tablet (325 mg total) by mouth daily with breakfast.   furosemide 80 MG tablet Commonly known as:  LASIX Take 1 tablet (80 mg total) by mouth 2 (two) times daily.   gabapentin 600 MG tablet Commonly known as:  NEURONTIN Take 600 mg by mouth 3 (three) times daily.   guaiFENesin-dextromethorphan 100-10 MG/5ML syrup Commonly known as:  ROBITUSSIN DM Take 15 mLs by mouth every 4 (four) hours as needed for cough.   HUMALOG 100 UNIT/ML cartridge Generic drug:  insulin lispro Inject into the skin. Inject as per sliding scale: if  0-70=0; 71-149 = 8 units, 150-600= 13 units subcutaneously  before meals related to DM.   HYDROcodone-acetaminophen 7.5-325 MG tablet Commonly known as:  NORCO Take 1 tablet by mouth every 4 (four) hours as needed for moderate pain or severe pain.   insulin NPH Human 100 UNIT/ML injection Commonly known as:  HUMULIN N,NOVOLIN N Inject 0.2 mLs (20 Units total) into the skin 2 (two) times daily before a meal. What changed:  additional instructions   lidocaine 5 % Commonly known as:  LIDODERM Place 1 patch onto the skin daily.   LORazepam 0.5 MG tablet Commonly known as:  ATIVAN Take 1 tablet (0.5 mg total) by mouth 2 (two) times daily as needed for anxiety.   lubiprostone 8 MCG capsule Commonly known as:  AMITIZA Take 8 mcg by mouth 2 (two) times daily with a meal.   methocarbamol 500 MG tablet Commonly known as:  ROBAXIN Take 1 tablet (500 mg total) by mouth 3 (three) times daily.   multivitamin with minerals Tabs tablet Take 1 tablet by mouth daily.   nitroGLYCERIN 0.4 MG SL tablet Commonly known as:  NITROSTAT Place 1 tablet (0.4 mg total) under the tongue as needed. For chest pains   omeprazole 40 MG capsule Commonly known as:  PRILOSEC Take 1 capsule (40 mg total) by mouth daily.   OXYGEN Inhale 2 L into the lungs as needed (for congestion, cough, and wheezing related to COPD).   polyethylene glycol packet Commonly known as:  MIRALAX / GLYCOLAX Take 17 g by mouth daily.   potassium chloride SA 20 MEQ tablet Commonly known as:  K-DUR,KLOR-CON Take 20 mEq by mouth daily.   predniSONE 10 MG tablet Commonly known as:  DELTASONE Take 1 tablet (10 mg total) by mouth 2 (two) times daily with a meal. Take 1 tab day 1, then take 2 tabs day 2, then 1 tab for day 3-5   rivaroxaban 20 MG Tabs tablet Commonly known as:  XARELTO Take 20 mg by mouth daily with supper.   senna-docusate 8.6-50 MG tablet Commonly known as:  Senokot-S Take 1 tablet by mouth 2 (two) times daily.   SYSTANE BALANCE 0.6 % Soln Generic drug:   Propylene Glycol Place 1 drop into both eyes 2 (two) times daily.   tamsulosin 0.4 MG Caps capsule Commonly known as:  FLOMAX Take 1 capsule (0.4 mg total) by mouth daily after supper.   traZODone 100 MG tablet Commonly known as:  DESYREL Take 100 mg by mouth at bedtime.   triamcinolone cream 0.1 % Commonly known as:  KENALOG Apply 1 application topically 2 (two) times daily.   vitamin A & D ointment Apply 1 application topically 2 (two) times daily as needed for dry skin.   Vitamin D (Ergocalciferol) 50000 units Caps capsule Commonly known as:  DRISDOL Take 1 capsule (50,000 Units total) by mouth every 7 (seven) days. What changed:  when to take this            Durable Medical Equipment        Start     Ordered   01/28/16 [phone]  For home use only DME Negative pressure wound device  Once    Question Answer Comment  Frequency of dressing change 3 times per week   Length of need 3 Months   Dressing type Foam   Amount of suction 125 mm/Hg   Pressure application Continuous pressure   Supplies 10 canisters and 15 dressings per month for duration of therapy      01/28/16 0918      Discharge Instructions: Please refer to Patient Instructions section of EMR for full details.  Patient was counseled important signs and symptoms that should prompt return to medical care, changes in medications, dietary instructions, activity restrictions, and follow up appointments.   Follow-Up Appointments:   [place] Bing, DO 01/29/2016, 2:49 PM PGY-1, [place]

## 2016-01-29 NOTE — Progress Notes (Signed)
[facility] to be D/C'd Skilled nursing facility per MD order.  Discussed prescriptions and follow up appointments with the patient. Prescriptions given to patient, medication list explained in detail. Pt verbalized understanding.    Medication List    STOP taking these medications   DECUBI-VITE PO     TAKE these medications   acetaminophen 325 MG tablet Commonly known as:  TYLENOL Take 650 mg by mouth every 6 (six) hours as needed for fever.   aspirin 81 MG EC tablet Take 1 tablet (81 mg total) by mouth daily.   atorvastatin 20 MG tablet Commonly known as:  LIPITOR Take 20 mg by mouth daily.   bisacodyl 10 MG suppository Commonly known as:  DULCOLAX Place 1 suppository (10 mg total) rectally daily as needed for moderate constipation.   CALAZIME SKIN PROTECTANT EX Apply topically. Apply to sacrum and right buttock every day shift   carvedilol 3.125 MG tablet Commonly known as:  COREG Take 1 tablet (3.125 mg total) by mouth 2 (two) times daily with a meal.   collagenase ointment Commonly known as:  SANTYL Apply 1 application topically daily. Apply Santyl to left and right heel wounds Q day, then cover with moist gauze and dry kerlex   docusate sodium 100 MG capsule Commonly known as:  COLACE Take 100 mg by mouth 2 (two) times daily.   doxycycline 100 MG tablet Commonly known as:  VIBRA-TABS Take 1 tablet (100 mg total) by mouth 2 (two) times daily.   DULoxetine 30 MG capsule Commonly known as:  CYMBALTA Take 90 mg by mouth daily.   feeding supplement (PRO-STAT SUGAR FREE 64) Liqd Take 30 mLs by mouth 2 (two) times daily. What changed:  how much to take  when to take this   ferrous sulfate 325 (65 FE) MG tablet Take 1 tablet (325 mg total) by mouth daily with breakfast.   furosemide 80 MG tablet Commonly known as:  LASIX Take 1 tablet (80 mg total) by mouth 2 (two) times daily.   gabapentin 600 MG tablet Commonly known as:  NEURONTIN Take 600 mg by  mouth 3 (three) times daily.   guaiFENesin-dextromethorphan 100-10 MG/5ML syrup Commonly known as:  ROBITUSSIN DM Take 15 mLs by mouth every 4 (four) hours as needed for cough.   HUMALOG 100 UNIT/ML cartridge Generic drug:  insulin lispro Inject into the skin. Inject as per sliding scale: if  0-70=0; 71-149 = 8 units, 150-600= 13 units subcutaneously before meals related to DM.   HYDROcodone-acetaminophen 7.5-325 MG tablet Commonly known as:  NORCO Take 1 tablet by mouth every 4 (four) hours as needed for moderate pain or severe pain.   insulin NPH Human 100 UNIT/ML injection Commonly known as:  HUMULIN N,NOVOLIN N Inject 0.2 mLs (20 Units total) into the skin 2 (two) times daily before a meal. What changed:  additional instructions   lidocaine 5 % Commonly known as:  LIDODERM Place 1 patch onto the skin daily.   LORazepam 0.5 MG tablet Commonly known as:  ATIVAN Take 1 tablet (0.5 mg total) by mouth 2 (two) times daily as needed for anxiety.   lubiprostone 8 MCG capsule Commonly known as:  AMITIZA Take 8 mcg by mouth 2 (two) times daily with a meal.   methocarbamol 500 MG tablet Commonly known as:  ROBAXIN Take 1 tablet (500 mg total) by mouth 3 (three) times daily.   multivitamin with minerals Tabs tablet Take 1 tablet by mouth daily.   nitroGLYCERIN 0.4  MG SL tablet Commonly known as:  NITROSTAT Place 1 tablet (0.4 mg total) under the tongue as needed. For chest pains   omeprazole 40 MG capsule Commonly known as:  PRILOSEC Take 1 capsule (40 mg total) by mouth daily.   OXYGEN Inhale 2 L into the lungs as needed (for congestion, cough, and wheezing related to COPD).   polyethylene glycol packet Commonly known as:  MIRALAX / GLYCOLAX Take 17 g by mouth daily.   potassium chloride SA 20 MEQ tablet Commonly known as:  K-DUR,KLOR-CON Take 20 mEq by mouth daily.   predniSONE 10 MG tablet Commonly known as:  DELTASONE Take 1 tablet (10 mg total) by mouth 2 (two)  times daily with a meal. Take 1 tab day 1, then take 2 tabs day 2, then 1 tab for day 3-5   rivaroxaban 20 MG Tabs tablet Commonly known as:  XARELTO Take 20 mg by mouth daily with supper.   senna-docusate 8.6-50 MG tablet Commonly known as:  Senokot-S Take 1 tablet by mouth 2 (two) times daily.   SYSTANE BALANCE 0.6 % Soln Generic drug:  Propylene Glycol Place 1 drop into both eyes 2 (two) times daily.   tamsulosin 0.4 MG Caps capsule Commonly known as:  FLOMAX Take 1 capsule (0.4 mg total) by mouth daily after supper.   traZODone 100 MG tablet Commonly known as:  DESYREL Take 100 mg by mouth at bedtime.   triamcinolone cream 0.1 % Commonly known as:  KENALOG Apply 1 application topically 2 (two) times daily.   vitamin A & D ointment Apply 1 application topically 2 (two) times daily as needed for dry skin.   Vitamin D (Ergocalciferol) 50000 units Caps capsule Commonly known as:  DRISDOL Take 1 capsule (50,000 Units total) by mouth every 7 (seven) days. What changed:  when to take this            Durable Medical Equipment        Start     Ordered   01/28/16 [phone]  For home use only DME Negative pressure wound device  Once    Question Answer Comment  Frequency of dressing change 3 times per week   Length of need 3 Months   Dressing type Foam   Amount of suction 125 mm/Hg   Pressure application Continuous pressure   Supplies 10 canisters and 15 dressings per month for duration of therapy      01/28/16 0918      Vitals:   01/29/16 0944 01/29/16 1720  BP:  (!) 144/62  Pulse:  74  Resp: 17 18  Temp: 98 F (36.7 C) 97.9 F (36.6 C)    Skin clean, dry and intact without evidence of skin break down, no evidence of skin tears noted. IV catheter discontinued intact. Site without signs and symptoms of complications. Dressing and pressure applied. Pt denies pain at this time. No complaints noted.  An After Visit Summary was printed and given to the  patient. Patient escorted via stretcher and D/C home via ambulance.  Retta Mac BSN, RN

## 2016-01-29 NOTE — Clinical Social Work Note (Signed)
Patient medically stable for discharge today and will return to [facility] skilled nursing facility. Mr. Kammeyer will be transported by ambulance. Wife, Butch Penny [phone]) contacted and message left regarding discharge. Talked with patient at bedside and he indicated that his wife is aware of his discharge.  Raif Chachere Givens, MSW, LCSW Licensed Clinical Social Worker [place] [phone]

## 2016-01-30 LAB — CULTURE, BLOOD (ROUTINE X 2): CULTURE: NO GROWTH

## 2016-01-31 ENCOUNTER — Encounter: Payer: Self-pay | Admitting: Adult Health

## 2016-01-31 ENCOUNTER — Non-Acute Institutional Stay (SKILLED_NURSING_FACILITY): Payer: Medicare Other | Admitting: Adult Health

## 2016-01-31 DIAGNOSIS — I130 Hypertensive heart and chronic kidney disease with heart failure and stage 1 through stage 4 chronic kidney disease, or unspecified chronic kidney disease: Secondary | ICD-10-CM | POA: Diagnosis not present

## 2016-01-31 DIAGNOSIS — N400 Enlarged prostate without lower urinary tract symptoms: Secondary | ICD-10-CM | POA: Diagnosis not present

## 2016-01-31 DIAGNOSIS — I5022 Chronic systolic (congestive) heart failure: Secondary | ICD-10-CM | POA: Diagnosis not present

## 2016-01-31 DIAGNOSIS — E1142 Type 2 diabetes mellitus with diabetic polyneuropathy: Secondary | ICD-10-CM | POA: Diagnosis not present

## 2016-01-31 DIAGNOSIS — L03115 Cellulitis of right lower limb: Secondary | ICD-10-CM

## 2016-01-31 DIAGNOSIS — J449 Chronic obstructive pulmonary disease, unspecified: Secondary | ICD-10-CM | POA: Diagnosis not present

## 2016-01-31 DIAGNOSIS — I2782 Chronic pulmonary embolism: Secondary | ICD-10-CM | POA: Diagnosis not present

## 2016-01-31 DIAGNOSIS — E1169 Type 2 diabetes mellitus with other specified complication: Secondary | ICD-10-CM | POA: Diagnosis not present

## 2016-01-31 DIAGNOSIS — K219 Gastro-esophageal reflux disease without esophagitis: Secondary | ICD-10-CM | POA: Diagnosis not present

## 2016-01-31 DIAGNOSIS — I25810 Atherosclerosis of coronary artery bypass graft(s) without angina pectoris: Secondary | ICD-10-CM | POA: Diagnosis not present

## 2016-01-31 DIAGNOSIS — E1151 Type 2 diabetes mellitus with diabetic peripheral angiopathy without gangrene: Secondary | ICD-10-CM | POA: Diagnosis not present

## 2016-01-31 DIAGNOSIS — E785 Hyperlipidemia, unspecified: Secondary | ICD-10-CM

## 2016-01-31 DIAGNOSIS — I70209 Unspecified atherosclerosis of native arteries of extremities, unspecified extremity: Secondary | ICD-10-CM

## 2016-01-31 LAB — POTASSIUM: Potassium: 3.5 mmol/L

## 2016-01-31 NOTE — Progress Notes (Signed)
Patient ID: [name], male   DOB: Aug 15, 1940, 75 y.o.   MRN: [id number]   Location:   [facility] Room Number: 159-B Place of Service:  SNF (31)   CODE STATUS: DNR  Allergies  Allergen Reactions  . Diazepam Anxiety    REACTION: makes patient cry  . Vancomycin Other (See Comments)    "red man syndrome"    Chief Complaint  Patient presents with  . Hospitalization Follow-up    Hospital Follow up    HPI:  He is a long term resident of this facility who has been hospitalized for right lower extremity cellulitis and sepsis. He tells me that he is feeling better; but is still tired. He tells me that he is happy to be home. There are no nursing concerns at this time.   Past Medical History:  Diagnosis Date  . Adhesive capsulitis   . Angina   . Anxiety   . CAD (coronary artery disease)   . CHF (congestive heart failure) ([place])   . Chronic back pain   . Chronic kidney disease    hx of BPH  . COPD (chronic obstructive pulmonary disease) ([place])   . CVA (cerebral infarction) Questionable history  . Depression   . Diabetes mellitus   . Diverticulosis   . Falls frequently 06/2014  . GERD (gastroesophageal reflux disease)   . Hyperlipidemia   . Hypertension   . 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 Myocardial infarction   . Neuromuscular disorder ([place])    HX of diabetic periferal neuropathy  . On home oxygen therapy    "1.5L prn" (04/12/2014)  . Osteomyelitis ([place]) 2012   Sternoclavicular joint   . PE (pulmonary embolism) 10-15 years ago   Lifelong Coumadin  . Peripheral vascular disease ([place])   . Shortness of breath   . Sleep apnea     Past Surgical History:  Procedure Laterality Date  . AMPUTATION  03/16/2011   Procedure: AMPUTATION DIGIT;  Surgeon: Angelia Mould, MD;  Location: [facility] OR;  Service: Vascular;  Laterality: Left;  Great toe  . AMPUTATION Left 11/08/2015   Procedure: AMPUTATION ABOVE KNEE  LEFT;  Surgeon: Waynetta Sandy, MD;  Location: [facility];  Service: Vascular;  Laterality: Left;  . CARDIAC CATHETERIZATION  October 18, 2010   Known obstructive disease, no further blockages  . CARDIAC CATHETERIZATION  03/17/2005   EF 35-40%  . COLONOSCOPY  2006    Multiple polyps removed, repeat in 5 years  . CORONARY ARTERY BYPASS GRAFT  1992  . [place], 2002  . DOPPLER ECHOCARDIOGRAPHY  Sept 2011   EF 50-55% with some impaired diastolic relaxation  . FEMORAL-POPLITEAL BYPASS GRAFT  2008   Left  . FRACTURE SURGERY  1980s   Hip  . HIP ARTHROPLASTY Left   . PERIPHERAL VASCULAR CATHETERIZATION N/A 11/06/2015   Procedure: Abdominal Aortogram;  Surgeon: Serafina Mitchell, MD;  Location: [facility];  Service: Cardiovascular;  Laterality: N/A;  . PERIPHERAL VASCULAR CATHETERIZATION Bilateral 11/06/2015   Procedure: Lower Extremity Angiography;  Surgeon: Serafina Mitchell, MD;  Location: [facility];  Service: Cardiovascular;  Laterality: Bilateral;  . Right iliopopliteal bypass - [place]  . septic arthritis     Removal of infected CABG wire by Dr Arlyce Dice - 2011  . SPINE SURGERY  2002   Cervical fusion vertebroplasty C3-4-5  . US ECHOCARDIOGRAPHY  03/04/2006   EF 50-55%    Social History   Social History  . Marital status: Married    Spouse name: Butch Penny  . Number of children: 3  . Years of education: 12   Occupational History  . Retired-machinest/welder Retired   Social History Main Topics  . Smoking status: Former Smoker    Quit date: 06/19/2000  . Smokeless tobacco: Never Used  . Alcohol use No  . Drug use: No  . Sexual activity: No   Other Topics Concern  . Not on file   Social History Narrative   Health Care POA: TBD   Emergency Contact: wife, Butch Penny [phone]   End of Life Plan: pt does not have AD and not interested.   Who lives with you: Butch Penny and step-daughter-Betty   Any pets: none   Diet: Pt has a varied diet of protein,  starch and vegetables.   Exercise: Pt does not have regular exercise routine.   Seatbelts: Pt reports wearing seatbelt when in vehicles.    Hobbies: listening to car races.             Family History  Problem Relation Age of Onset  . Heart disease Father   . Heart disease Mother       VITAL SIGNS BP (!) 105/96   Pulse 71   Temp 98.2 F (36.8 C) (Oral)   Resp 20   Ht 5\' 6"  (1.676 m)   Wt 236 lb 2 oz (107.1 kg)   SpO2 95%   BMI 38.11 kg/m   Patient's Medications  New Prescriptions   No medications on file  Previous Medications   ACETAMINOPHEN (TYLENOL) 325 MG TABLET    Take 650 mg by mouth every 6 (six) hours as needed for fever.   AMINO ACIDS-PROTEIN HYDROLYS (FEEDING SUPPLEMENT, PRO-STAT SUGAR FREE 64,) LIQD    Take 30 mLs by mouth 2 (two) times daily.   ASPIRIN EC 81 MG EC TABLET    Take 1 tablet (81 mg total) by mouth daily.   ATORVASTATIN (LIPITOR) 20 MG TABLET    Take 20 mg by mouth daily.   BISACODYL (DULCOLAX) 10 MG SUPPOSITORY    Place 1 suppository (10 mg total) rectally daily as needed for moderate constipation.   CARVEDILOL (COREG) 3.125 MG TABLET    Take 1 tablet (3.125 mg total) by mouth 2 (two) times daily with a meal.   COLLAGENASE (SANTYL) OINTMENT    Apply 1 application topically daily. Apply Santyl to left and right heel wounds Q day, then cover with moist gauze and dry kerlex   DOCUSATE SODIUM (COLACE) 100 MG CAPSULE    Take 100 mg by mouth 2 (two) times daily.   DOXYCYCLINE (VIBRA-TABS) 100 MG TABLET    Take 1 tablet (100 mg total) by mouth 2 (two) times daily.   DULOXETINE (CYMBALTA) 30 MG CAPSULE    Take 90 mg by mouth daily.   FERROUS SULFATE 325 (65 FE) MG TABLET    Take 1 tablet (325 mg total) by mouth daily with breakfast.   FUROSEMIDE (LASIX) 80 MG TABLET    Take 1 tablet (80 mg total) by mouth 2 (two) times daily.   GABAPENTIN (NEURONTIN) 600 MG TABLET    Take 600 mg by mouth 3 (three) times daily.   GUAIFENESIN-DEXTROMETHORPHAN (ROBITUSSIN DM)  100-10 MG/5ML SYRUP    Take 15 mLs by mouth every 4 (four) hours as needed for cough.   HYDROCODONE-ACETAMINOPHEN (NORCO) 7.5-325 MG TABLET  Take 1 tablet by mouth every 4 (four) hours as needed for moderate pain or severe pain.   INSULIN LISPRO (HUMALOG) 100 UNIT/ML CARTRIDGE    Inject into the skin. Inject as per sliding scale: if  0-70=0; 71-149 = 8 units, 150-600= 13 units subcutaneously before meals related to DM.   INSULIN NPH HUMAN (HUMULIN N,NOVOLIN N) 100 UNIT/ML INJECTION    Inject 0.2 mLs (20 Units total) into the skin 2 (two) times daily before a meal.   LIDOCAINE (LIDODERM) 5 %    Place 1 patch onto the skin daily.    LORAZEPAM (ATIVAN) 0.5 MG TABLET    Take 1 tablet (0.5 mg total) by mouth 2 (two) times daily as needed for anxiety.   LUBIPROSTONE (AMITIZA) 8 MCG CAPSULE    Take 8 mcg by mouth 2 (two) times daily with a meal.   METHOCARBAMOL (ROBAXIN) 500 MG TABLET    Take 1 tablet (500 mg total) by mouth 3 (three) times daily.   MULTIPLE VITAMIN (MULTIVITAMIN WITH MINERALS) TABS TABLET    Take 1 tablet by mouth daily.   NITROGLYCERIN (NITROSTAT) 0.4 MG SL TABLET    Place 1 tablet (0.4 mg total) under the tongue as needed. For chest pains   OMEPRAZOLE (PRILOSEC) 40 MG CAPSULE    Take 1 capsule (40 mg total) by mouth daily.   OXYGEN    Inhale 2 L into the lungs as needed (for congestion, cough, and wheezing related to COPD).   POLYETHYLENE GLYCOL (MIRALAX / GLYCOLAX) PACKET    Take 17 g by mouth daily.   POTASSIUM CHLORIDE SA (K-DUR,KLOR-CON) 20 MEQ TABLET    Take 20 mEq by mouth daily.    PREDNISONE (DELTASONE) 10 MG TABLET    Take 1 tablet (10 mg total) by mouth 2 (two) times daily with a meal. Take 1 tab day 1, then take 2 tabs day 2, then 1 tab for day 3-5   PROPYLENE GLYCOL (SYSTANE BALANCE) 0.6 % SOLN    Place 1 drop into both eyes 2 (two) times daily.    RIVAROXABAN (XARELTO) 20 MG TABS TABLET    Take 20 mg by mouth daily with supper.   SENNA-DOCUSATE (SENOKOT-S) 8.6-50 MG  TABLET    Take 1 tablet by mouth 2 (two) times daily.   SKIN PROTECTANTS, MISC. (CALAZIME SKIN PROTECTANT EX)    Apply topically. Apply to sacrum and right buttock every day shift   TAMSULOSIN (FLOMAX) 0.4 MG CAPS CAPSULE    Take 1 capsule (0.4 mg total) by mouth daily after supper.   TRAZODONE (DESYREL) 100 MG TABLET    Take 100 mg by mouth at bedtime.    TRIAMCINOLONE CREAM (KENALOG) 0.1 %    Apply 1 application topically 2 (two) times daily.   VITAMIN D, ERGOCALCIFEROL, (DRISDOL) 50000 UNITS CAPS CAPSULE    Take 1 capsule (50,000 Units total) by mouth every 7 (seven) days.   VITAMINS A & D (VITAMIN A & D) OINTMENT    Apply 1 application topically 2 (two) times daily as needed for dry skin.  Modified Medications   No medications on file  Discontinued Medications   No medications on file     SIGNIFICANT DIAGNOSTIC EXAMS   09-13-14: right shoulder x-ray: Advanced degenerative change in the shoulder joint. Negative for Fracture.  12-22-14: right hand and right wrist x-ray: no acute process; right hand osteoarthritis present.   07-26-15: chest x-ray: bilateral lower lobe atelectasis.   07-30-15: chest x-ray: no acute cardiopulmonary  pathology   10-22-15: 2-d echo: - Left ventricle: The cavity size was mildly dilated. Wall thickness was increased in a pattern of mild LVH. Systolic function was moderately to severely reduced. The estimated ejection fraction was in the range of 30% to 35%. Difficult images even with Definity. There appears to be severe hypokinesis of the inferior and inferolateral walls. Features are consistent with a pseudonormal left ventricular filling pattern, with concomitant abnormal relaxation and increased filling pressure  (grade 2 diastolic dysfunction). - Aortic valve: Trileaflet; moderately calcified leaflets. There was no stenosis. - Mitral valve: Moderately calcified annulus. Mildly calcified leaflets . There was moderate regurgitation. - Left atrium: The atrium was  mildly dilated. - Right ventricle: Poorly visualized. The cavity size was normal. Wall thickness was normal. - Tricuspid valve: Peak RV-RA gradient (S): 24 mm Hg. - Pulmonary arteries: PA peak pressure: 39 mm Hg (S). - Systemic veins: IVC measured 2.6 cm with < 50% respirophasic  variation, suggesting RA pressure 15 mmHg. Impressions: - Mildly dilated LV with mild LV hypertrophy. Difficult images even with Definity, but EF appears to be 30-35% with severe hypokinesis of the inferior and inferolateral walls. Moderate diastolic dysfunction. The RV was poorly visualized, probably mildly decreased systolic function. Moderate mitral regurgitation. Dilated IVC with mild pulmonary hypertension.  10-31-15: ct of head: 1. No acute finding or change compared to prior. 2. Remote left MCA territory infarction. Remote small bilateral cerebellar infarcts.  01-24-16: chest x-ray: No acute process or explanation for sepsis Cardiomegaly with left hemidiaphragm elevation and adjacent volume loss/atelectasis.  01-24-16: right tibia/fibula x-ray: 1. No evidence of fracture or dislocation. 2. Mild degenerative change at the medial compartment of the right knee. 3. Diffuse vascular calcifications seen.  01-26-16: right foot x-ray: No acute finding or focal bone loss by plain radiography.   LABS REVIEWED:   02-23-15: tsh 4.032; vit d 11  03-08-15: wbc 7.7; hgb 11.5; hct 35.7; mcv 79.7; plt 156; glucose 115; bun 19; creat 0.96; k+ 4.6; na++146; liver normal albumin 3.4 INR 1.66  04-04-15: glucose 136; bun 19; creat 1.02; k+ 3.7; na++142 06-04-15: INR 2.77  06-18-15: INR 2.41  Urine for micro-albumin 0.7  06-25-15: wbc 8.0; hgb 12.1; hct 37.9 ;mcv 78.6; plt 159; glucose 122; bun 20; creat 0.93; k+ 4.3; na++142 liver normal albumin 3.6; chol 151; ldl 87; trig 162; hdl 32; hgb a1c 7.4 07-26-15: wbc 7.7; hgb 12.;3 hct 37.2; mcv 77.3; plt 106; glucose 161; bun 37; creat 1.75; k+ 4.3; na++136  07-30-15: wbc 12.8; hgb 10.7; hct  3.2; mcv 77.4; plt 174; g glucose 120; bun 43; creat 1.22; k+ 4.3; na++134 inr >10 (question reading of inr)  12-19-15: glucose 217; bun 13; creat 0.74; k+ 3.0; na++ 142; liver normal albumin 3.3; hgb a1c 5.7 12-26-15: glucose 103; bun 15; creat 0.73; k+ 3.2; na++ 142; liver normal albumin 3.2  01-24-16: wbc 35.1; hgb 11.5; hct 36.2; mcv 75.9; plt 288; glucose 181; bun 17; creat 0.99; k+ 2.9; na++ 131; liver normal albumin 2.7; urine culture: no growth 01-26-16: wbc 31.6; hgb 11.5; hct 37.4; mcv 77.4; plt 313; glucose 155; bun 13; creat 0.64;  K+ 4.5 ;na++ 137; mag 2.3; phos 3.3  01-28-16: wbc 11.7 ;hgb 10.5; hct 33.5 ;mcv 75.5; plt 280; glucose 193; bun 12; creat 0.73; k+ 3.8; na++ 138; phos 2.9 01-29-16: wbc 12.5; hgb 10.4; hct 34.5; mcv 76.8; pt 292     Review of Systems Constitutional: does have fatigue   Respiratory: no cough or shortness  of breath  Cardiovascular: Negative for chest pain, palpitations and leg swelling.  Gastrointestinal: Negative for heartburn, abdominal pain and constipation.  Musculoskeletal: chronic back pain Skin: drainage on incision line    Neurological: Negative for headaches.  Psychiatric/Behavioral: . The patient is not nervous/anxious    Physical Exam Constitutional: He is oriented to person, place, and time. No distress.  Obese   Neck: Neck supple. No JVD present. No thyromegaly present.  Cardiovascular: Normal rate, regular rhythm and intact distal pulses.   Respiratory: Effort normal and breath sounds normal. No respiratory distress.  GI: Soft. Bowel sounds are normal. He exhibits no distension. There is no tenderness.  Musculoskeletal:  Is status post left aka  Is able to move all extremities 1+ right lower extremity edema   Neurological: He is alert and oriented to person, place, and time.  Skin: Skin is warm and dry. He is not diaphoretic.  right heel; DIT: 1.8 x 2.3 cm Left medial thigh 5.4 x 4.0 x 0.24 cm santyl and calcium alginate Left lateral  thigh: 1.5 x 0.9 x 0.4 cm wound vac  Psychiatric: He has a normal mood and affect.    ASSESSMENT/ PLAN:   1. Chronic systolic heart failure: EF 30-35% (10-22-15): will continue lasix 80 mg twice daily with k+ 20 meq  daily coreg 3.125 mg twice daily   2. Hypertension: will continue coreg 3.125 mg twice daily   3. CAD: status post CABG and stents: will continue asa 81 mg daily and ntg prn  4. Chronic PE: is stable will continue xarelto 20 mg daily  5. Dyslipidemia: ldl 87 will continue liptor 20 mg daily   6. Diabetes: hgb a1c 5.7; will continue NPH 20 units twice daily and humalog 8 units with meals with an additional 3 units for cbg >150  7. PVD: will continue asa 81 mg is status post left aka  Will continue vicodin 7.5/325 mg every 4 hours as needed   8. Anemia: hgb 10.4 will continue iron daily   9. COPD: is on chronic 02 therapy;   10. Constipation: will continue colace twice daily senna s twice daily miralax daily and  amitiza 8 mcg twice daily   11. Peripheral neuropathy: will continue cymbalta 90 mg daily neurontin 600 mg three times daily robaxin 500 mg three times daily for spasms  12. Anxiety with depression: will continue cymbalta 90 mg daily also takes for pain; takes trazodone 100 mg nightly will continue ativan 0.5 mg twice daily as needed  13. Gerd: will continue prilosec 40 mg daily  14. Bilateral shoulder pain: will continue lidoderm patch; will continue vicodin 7.5/325 mg every 4 hours as needed   15. Vit D deficiency: will continue vitamin d 50,000 units weekly   16. BPH: will continue flomax 0.4 mg daily   17. Right lower cellulitis: will complete doxycycline 100 mg twice daily for 8 more days. Will complete prednisone taper     Time spent with patient  50   minutes >50% time spent counseling; reviewing medical record; tests; labs; and developing future plan of care     Ok Edwards NP [facility]  Contact [phone] Monday through  Friday 8am- 5pm  After hours call [phone]

## 2016-02-05 ENCOUNTER — Non-Acute Institutional Stay (SKILLED_NURSING_FACILITY): Payer: Medicare Other | Admitting: Internal Medicine

## 2016-02-05 ENCOUNTER — Encounter: Payer: Self-pay | Admitting: Internal Medicine

## 2016-02-05 DIAGNOSIS — J302 Other seasonal allergic rhinitis: Secondary | ICD-10-CM | POA: Diagnosis not present

## 2016-02-05 DIAGNOSIS — I70209 Unspecified atherosclerosis of native arteries of extremities, unspecified extremity: Secondary | ICD-10-CM

## 2016-02-05 DIAGNOSIS — L03115 Cellulitis of right lower limb: Secondary | ICD-10-CM

## 2016-02-05 DIAGNOSIS — J449 Chronic obstructive pulmonary disease, unspecified: Secondary | ICD-10-CM | POA: Diagnosis not present

## 2016-02-05 DIAGNOSIS — I2782 Chronic pulmonary embolism: Secondary | ICD-10-CM | POA: Diagnosis not present

## 2016-02-05 DIAGNOSIS — Z89612 Acquired absence of left leg above knee: Secondary | ICD-10-CM

## 2016-02-05 DIAGNOSIS — I5022 Chronic systolic (congestive) heart failure: Secondary | ICD-10-CM | POA: Diagnosis not present

## 2016-02-05 DIAGNOSIS — M159 Polyosteoarthritis, unspecified: Secondary | ICD-10-CM

## 2016-02-05 DIAGNOSIS — E1151 Type 2 diabetes mellitus with diabetic peripheral angiopathy without gangrene: Secondary | ICD-10-CM | POA: Diagnosis not present

## 2016-02-05 DIAGNOSIS — M150 Primary generalized (osteo)arthritis: Secondary | ICD-10-CM | POA: Diagnosis not present

## 2016-02-05 NOTE — Progress Notes (Signed)
Patient ID: [name], male   DOB: 1941/01/02, 75 y.o.   MRN: [id number]    HISTORY AND PHYSICAL   DATE:  02/05/2016  Location:    [facility] Room Number: 159 B Place of Service: SNF (31)   Extended Emergency Contact Information Primary Emergency Contact: Lianne Moris Address: [place], [place] [employer] Phone: [phone] Relation: Spouse Secondary Emergency Contact: Stedron,Cathy  [place] Mobile Phone: [phone] Relation: Daughter Preferred language: English  Advanced Directive information Does patient have an advance directive?: Yes, Type of Advance Directive: Out of facility DNR (pink MOST or yellow form), Does patient want to make changes to advanced directive?: No - Patient declined  Chief Complaint  Patient presents with  . Readmit To SNF    HPI:  75 yo male long term resident seen today as a readmission into SNF following hospital stay for RLE cellulitis, right heel ulcer, left AKA ulcer, chronic PE, systolic HF, DM, COPD, anxiety/depresion and BPH. He was admitted to the ICU in spetic shock and req'd pressor support. Wound vac applied to left AKA ulcer. He rec'd IV abx-->po doxy (need total 8 days with stop date 11/14th. He presents to SNF to continue long term care  Today he c/o HA and hoarseness. No other concerns. Wound care following wound vac. No nursing issues. No falls. Appetite ok. Sleeps well.   Chronic systolic heart failure - EF 30-35% (10-22-15); takes lasix 80 mg twice daily with k+ 20 meq daily; coreg 3.125 mg twice daily   Hypertension - BP stable on coreg 3.125 mg twice daily   CAD s/p CABG and stents - takes asa 81 mg daily and ntg prn  Chronic PE- stable on xarelto 20 mg daily  Hyperlipidemia - stable on lipitor 20mg  daily; LDL  87  DM - A1c 5.7%;CBG 131 today. He takes  NPH 20 units twice daily and humalog 8 units with meals with an additional 3 units  for cbg >150. He has hyperlipidemia, peripheral neuropathy PVD and HTN  PVD - s/p left AKA. stable on ASA 81 mg.   Pain stable on vicodin 7.5/325 mg every 4 hours as needed   Hx Anemia - stable on iron daily. Hgb 10.4   COPD/chronic respiratory failure with hypoxia - he is on chronic 02 therapy;   Constipation - stable on colace twice daily; senna s twice daily; miralax daily and amitiza 8 mcg twice daily   Peripheral neuropathy - assoc with DM. stable on cymbalta 90 mg daily; neurontin 600 mg three times daily; robaxin 500 mg three times daily for spasms  Anxiety with depression - stable on cymbalta 90 mg daily; trazodone 100 mg nightly; ativan 0.5 mg twice daily as needed  GERD - stable on prilosec 40 mg daily  Bilateral shoulder pain - stable on lidoderm patch;  vicodin 7.5/325 mg every 4 hours as needed   Vit D deficiency - stable on vitamin d 50,000 units weekly   BPH - sx's stable on flomax 0.4 mg daily   Past Medical History:  Diagnosis Date  . Adhesive capsulitis   . Angina   . Anxiety   . CAD (coronary artery disease)   . CHF (congestive heart failure) ([place])   . Chronic back pain   . Chronic kidney disease    hx of BPH  . COPD (chronic obstructive pulmonary disease) ([place])   . CVA (  cerebral infarction) Questionable history  . Depression   . Diabetes mellitus   . Diverticulosis   . Falls frequently 06/2014  . GERD (gastroesophageal reflux disease)   . Hyperlipidemia   . Hypertension   . 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 Myocardial infarction   . Neuromuscular disorder ([place])    HX of diabetic periferal neuropathy  . On home oxygen therapy    "1.5L prn" (04/12/2014)  . Osteomyelitis ([place]) 2012   Sternoclavicular joint   . PE (pulmonary embolism) 10-15 years ago   Lifelong Coumadin  . Peripheral vascular disease ([place])   . Shortness of breath   . Sleep apnea     Past Surgical History:  Procedure Laterality Date   . AMPUTATION  03/16/2011   Procedure: AMPUTATION DIGIT;  Surgeon: Angelia Mould, MD;  Location: [facility] OR;  Service: Vascular;  Laterality: Left;  Great toe  . AMPUTATION Left 11/08/2015   Procedure: AMPUTATION ABOVE KNEE LEFT;  Surgeon: Waynetta Sandy, MD;  Location: [facility];  Service: Vascular;  Laterality: Left;  . CARDIAC CATHETERIZATION  October 18, 2010   Known obstructive disease, no further blockages  . CARDIAC CATHETERIZATION  03/17/2005   EF 35-40%  . COLONOSCOPY  2006    Multiple polyps removed, repeat in 5 years  . CORONARY ARTERY BYPASS GRAFT  1992  . [place], 2002  . DOPPLER ECHOCARDIOGRAPHY  Sept 2011   EF 50-55% with some impaired diastolic relaxation  . FEMORAL-POPLITEAL BYPASS GRAFT  2008   Left  . FRACTURE SURGERY  1980s   Hip  . HIP ARTHROPLASTY Left   . PERIPHERAL VASCULAR CATHETERIZATION N/A 11/06/2015   Procedure: Abdominal Aortogram;  Surgeon: Serafina Mitchell, MD;  Location: [facility];  Service: Cardiovascular;  Laterality: N/A;  . PERIPHERAL VASCULAR CATHETERIZATION Bilateral 11/06/2015   Procedure: Lower Extremity Angiography;  Surgeon: Serafina Mitchell, MD;  Location: [facility];  Service: Cardiovascular;  Laterality: Bilateral;  . Right iliopopliteal bypass - [place]  . septic arthritis     Removal of infected CABG wire by Dr Arlyce Dice - 2011  . SPINE SURGERY  2002   Cervical fusion vertebroplasty C3-4-5  . US ECHOCARDIOGRAPHY  03/04/2006   EF 50-55%    Patient Care Team: Alveda Reasons, MD as PCP - General Darlin Coco, MD (Cardiology) Clent Jacks, MD (Ophthalmology) Christin Fudge, MD as Consulting Physician (Surgery)  Social History   Social History  . Marital status: Married    Spouse name: Butch Penny  . Number of children: 3  . Years of education: 12   Occupational History  . Retired-machinest/welder Retired   Social History Main Topics  . Smoking status: Former Smoker    Quit date:  06/19/2000  . Smokeless tobacco: Never Used  . Alcohol use No  . Drug use: No  . Sexual activity: No   Other Topics Concern  . Not on file   Social History Narrative   Health Care POA: TBD   Emergency Contact: wife, Butch Penny [phone]   End of Life Plan: pt does not have AD and not interested.   Who lives with you: Butch Penny and step-daughter-Betty   Any pets: none   Diet: Pt has a varied diet of protein, starch and vegetables.   Exercise: Pt does not have regular exercise routine.   Seatbelts: Pt reports wearing seatbelt when in vehicles.    Hobbies: listening  to car races.               reports that he quit smoking about 15 years ago. He has never used smokeless tobacco. He reports that he does not drink alcohol or use drugs.  Family History  Problem Relation Age of Onset  . Heart disease Father   . Heart disease Mother    Family Status  Relation Status  . Father Deceased  . Mother Deceased    Immunization History  Administered Date(s) Administered  . Influenza Split 12/12/2010  . Influenza Whole 12/22/2005, 12/14/2007  . Influenza,inj,Quad PF,36+ Mos 01/20/2013, 12/09/2013  . Influenza-Unspecified 12/15/2014, 01/15/2016  . Pneumococcal Conjugate-13 10/10/2013  . Pneumococcal Polysaccharide-23 11/23/1999, 03/25/2003, 05/25/2011  . Td 10/23/2003  . Tdap 07/17/2014    Allergies  Allergen Reactions  . Diazepam Anxiety    REACTION: makes patient cry  . Vancomycin Other (See Comments)    "red man syndrome"    Medications: Patient's Medications  New Prescriptions   No medications on file  Previous Medications   ACETAMINOPHEN (TYLENOL) 325 MG TABLET    Take 650 mg by mouth every 6 (six) hours as needed for fever.   AMINO ACIDS-PROTEIN HYDROLYS (FEEDING SUPPLEMENT, PRO-STAT SUGAR FREE 64,) LIQD    Take 30 mLs by mouth 2 (two) times daily.   ASPIRIN EC 81 MG EC TABLET    Take 1 tablet (81 mg total) by mouth daily.   ATORVASTATIN (LIPITOR) 20 MG TABLET    Take 20 mg by  mouth daily.   BISACODYL (DULCOLAX) 10 MG SUPPOSITORY    Place 1 suppository (10 mg total) rectally daily as needed for moderate constipation.   CARVEDILOL (COREG) 3.125 MG TABLET    Take 1 tablet (3.125 mg total) by mouth 2 (two) times daily with a meal.   COLLAGENASE (SANTYL) OINTMENT    Apply 1 application topically daily. Apply Santyl to left and right heel wounds Q day, then cover with moist gauze and dry kerlex   DOCUSATE SODIUM (COLACE) 100 MG CAPSULE    Take 100 mg by mouth 2 (two) times daily.   DOXYCYCLINE (VIBRA-TABS) 100 MG TABLET    Take 1 tablet (100 mg total) by mouth 2 (two) times daily.   DULOXETINE (CYMBALTA) 30 MG CAPSULE    Take 90 mg by mouth daily.   FERROUS SULFATE 325 (65 FE) MG TABLET    Take 1 tablet (325 mg total) by mouth daily with breakfast.   FUROSEMIDE (LASIX) 80 MG TABLET    Take 1 tablet (80 mg total) by mouth 2 (two) times daily.   GABAPENTIN (NEURONTIN) 600 MG TABLET    Take 600 mg by mouth 3 (three) times daily.   GUAIFENESIN-DEXTROMETHORPHAN (ROBITUSSIN DM) 100-10 MG/5ML SYRUP    Take 15 mLs by mouth every 4 (four) hours as needed for cough.   HYDROCODONE-ACETAMINOPHEN (NORCO) 7.5-325 MG TABLET    Take 1 tablet by mouth every 4 (four) hours as needed for moderate pain or severe pain.   INSULIN LISPRO (HUMALOG) 100 UNIT/ML CARTRIDGE    Inject into the skin. Inject as per sliding scale: if  0-70=0; 71-149 = 8 units, 150-600= 13 units subcutaneously before meals related to DM.   INSULIN NPH HUMAN (HUMULIN N,NOVOLIN N) 100 UNIT/ML INJECTION    Inject 0.2 mLs (20 Units total) into the skin 2 (two) times daily before a meal.   LIDOCAINE (LIDODERM) 5 %    Place 1 patch onto the skin daily.  LORAZEPAM (ATIVAN) 0.5 MG TABLET    Take 1 tablet (0.5 mg total) by mouth 2 (two) times daily as needed for anxiety.   LUBIPROSTONE (AMITIZA) 8 MCG CAPSULE    Take 8 mcg by mouth 2 (two) times daily with a meal.   METHOCARBAMOL (ROBAXIN) 500 MG TABLET    Take 1 tablet (500 mg  total) by mouth 3 (three) times daily.   MULTIPLE VITAMIN (MULTIVITAMIN WITH MINERALS) TABS TABLET    Take 1 tablet by mouth daily.   NITROGLYCERIN (NITROSTAT) 0.4 MG SL TABLET    Place 1 tablet (0.4 mg total) under the tongue as needed. For chest pains   OMEPRAZOLE (PRILOSEC) 40 MG CAPSULE    Take 1 capsule (40 mg total) by mouth daily.   OXYGEN    Inhale 2 L into the lungs as needed (for congestion, cough, and wheezing related to COPD).   POLYETHYLENE GLYCOL (MIRALAX / GLYCOLAX) PACKET    Take 17 g by mouth daily.   POTASSIUM CHLORIDE SA (K-DUR,KLOR-CON) 20 MEQ TABLET    Take 20 mEq by mouth daily.    PREDNISONE (DELTASONE) 10 MG TABLET    Take 1 tablet (10 mg total) by mouth 2 (two) times daily with a meal. Take 1 tab day 1, then take 2 tabs day 2, then 1 tab for day 3-5   PROPYLENE GLYCOL (SYSTANE BALANCE) 0.6 % SOLN    Place 1 drop into both eyes 2 (two) times daily.    RIVAROXABAN (XARELTO) 20 MG TABS TABLET    Take 20 mg by mouth daily with supper.   SENNA-DOCUSATE (SENOKOT-S) 8.6-50 MG TABLET    Take 1 tablet by mouth 2 (two) times daily.   SKIN PROTECTANTS, MISC. (CALAZIME SKIN PROTECTANT EX)    Apply topically. Apply to sacrum and right buttock every day shift   TAMSULOSIN (FLOMAX) 0.4 MG CAPS CAPSULE    Take 1 capsule (0.4 mg total) by mouth daily after supper.   TRAZODONE (DESYREL) 100 MG TABLET    Take 100 mg by mouth at bedtime.    TRIAMCINOLONE CREAM (KENALOG) 0.1 %    Apply 1 application topically 2 (two) times daily.   VITAMIN D, ERGOCALCIFEROL, (DRISDOL) 50000 UNITS CAPS CAPSULE    Take 1 capsule (50,000 Units total) by mouth every 7 (seven) days.   VITAMINS A & D (VITAMIN A & D) OINTMENT    Apply 1 application topically 2 (two) times daily as needed for dry skin.  Modified Medications   No medications on file  Discontinued Medications   No medications on file    Review of Systems  HENT: Positive for voice change.   Cardiovascular: Positive for palpitations.    Musculoskeletal: Positive for arthralgias.  Skin: Positive for wound.  Neurological: Positive for numbness and headaches.  All other systems reviewed and are negative.   Vitals:   02/05/16 1009  BP: (!) 127/50  Pulse: 75  Resp: 20  Temp: 98.6 F (37 C)  TempSrc: Oral  SpO2: 94%  Weight: 236 lb 3.2 oz (107.1 kg)  Height: 5\' 6"  (1.676 m)   Body mass index is 38.12 kg/m.  Physical Exam  Constitutional: He appears well-developed.  Sitting in w/c in NAD; [place] O2 intact  HENT:  Mouth/Throat: Oropharynx is clear and moist. No oropharyngeal exudate.  Maxillary sinus TTP with boggy tissue texture changes  Eyes: Pupils are equal, round, and reactive to light. No scleral icterus.  Neck: Neck supple. Carotid bruit is not present. No  thyromegaly present.  Cardiovascular: Normal rate, regular rhythm and intact distal pulses.  Exam reveals no gallop and no friction rub.   Murmur (1/6 SEM) heard. Trace RLE edema with chronic venous stasis changes. Right foot dsg intact.  No calf TTP. Left AKA noted  Pulmonary/Chest: Effort normal. He has no wheezes. He has no rales. He exhibits no tenderness.  Reduced BS at base b/l  Abdominal: Soft. Bowel sounds are normal. He exhibits no distension, no abdominal bruit, no pulsatile midline mass and no mass. There is no tenderness. There is no rebound and no guarding.  Musculoskeletal: He exhibits edema, tenderness and deformity (left AKA with wound vac intact).  Lymphadenopathy:    He has cervical adenopathy.  Neurological: He is alert.  Skin: Skin is warm and dry. No rash noted.  RLE redness improved with hyperpigmentation; right heel ulcer dsg c/d/i; followed by wound care  Psychiatric: He has a normal mood and affect. His behavior is normal.     Labs reviewed: Nursing Home on 01/31/2016  Component Date Value Ref Range Status  . Potassium 01/31/2016 3.5  mmol/L Final  Admission on 01/24/2016, Discharged on 01/29/2016  No results displayed  because visit has over 200 results.    Nursing Home on 11/13/2015  Component Date Value Ref Range Status  . Triglycerides 09/21/2015 100  40 - 160 mg/dL Final  . Cholesterol 09/21/2015 104  0 - 200 mg/dL Final  . HDL 09/21/2015 28* 35 - 70 mg/dL Final  . LDL Cholesterol 09/21/2015 56  mg/dL Final  Admission on 10/31/2015, Discharged on 11/10/2015  No results displayed because visit has over 200 results.      Dg Chest Portable 1 View  Result Date: 01/24/2016 CLINICAL DATA:  Sepsis. EXAM: PORTABLE CHEST 1 VIEW COMPARISON:  10/31/2015 FINDINGS: Mildly degraded exam due to AP portable technique and patient body habitus. Prior median sternotomy. Numerous leads and wires project over the chest. Apical lordotic positioning. Right glenohumeral joint osteoarthritis. Patient rotated right. Mild cardiomegaly. Left hemidiaphragm elevation. No right and no definite left pleural effusion. No pneumothorax. Low lung volumes with resultant pulmonary interstitial prominence. No overt congestive failure. Mild left base atelectasis is similar. IMPRESSION: No acute process or explanation for sepsis Cardiomegaly with left hemidiaphragm elevation and adjacent volume loss/atelectasis. Electronically Signed   By: Abigail Miyamoto M.D.   On: 01/24/2016 18:00   Dg Tibia/fibula Right Port  Result Date: 01/24/2016 CLINICAL DATA:  Acute onset of right lower leg cellulitis. Initial encounter. EXAM: PORTABLE RIGHT TIBIA AND FIBULA - 2 VIEW COMPARISON:  None. FINDINGS: There is narrowing of the medial compartment, with marginal osteophyte formation at the medial compartment. Tibial spine and wall osteophytes are also seen. There is no evidence of fracture or dislocation. The tibia and fibula appear grossly intact. The ankle mortise is incompletely assessed, but appears grossly unremarkable. No significant knee joint effusion is seen. Scattered clips are noted about the right lower extremity, and diffuse vascular calcifications are  seen. Mild diffuse soft tissue edema is noted about the right lower extremity. IMPRESSION: 1. No evidence of fracture or dislocation. 2. Mild degenerative change at the medial compartment of the right knee. 3. Diffuse vascular calcifications seen. Electronically Signed   By: Garald Balding M.D.   On: 01/24/2016 21:49   Dg Foot 2 Views Right  Result Date: 01/26/2016 CLINICAL DATA:  Diabetes, right heel wound. EXAM: RIGHT FOOT - 2 VIEW COMPARISON:  None available FINDINGS: previous surgical clips from the saphenous vein harvest. Degenerative  changes noted of the right first MTP joint compatible with osteoarthritis. Normal alignment. No acute osseous finding or fracture. No osseous destruction, bone loss, periostitis. No radiopaque foreign body. Peripheral atherosclerosis noted. IMPRESSION: No acute finding or focal bone loss by plain radiography. Electronically Signed   By: Jerilynn Mages.  Shick M.D.   On: 01/26/2016 11:37     Assessment/Plan   ICD-9-CM ICD-10-CM   1. Chronic seasonal allergic rhinitis, unspecified trigger 477.8 J30.2   2. Cellulitis of right leg 682.6 L03.115   3. Chronic systolic CHF (congestive heart failure) (HCC) 428.22 I50.22    428.0    4. 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 Other chronic pulmonary embolism without acute cor pulmonale (HCC) 416.2 I27.82   6. Chronic obstructive pulmonary disease, unspecified COPD type ([place]) 496 J44.9   7. S/P AKA (above knee amputation) unilateral, left ([place]) V49.76 Z89.612   8. Primary osteoarthritis involving multiple joints 715.09 M15.0     Start zyrtec 10mg  daily  Cont other meds as ordered. Complete abx today  PT/OT/ST as ordered  Wound care as ordered  Wound vac care as indicated  F/u with specialists as scheduled  Nutritional supplements as indicated  GOAL: short term rehab then continue long term care Communicated with pt and nursing.  Will follow  Praise Dolecki  S. Perlie Gold  [facility] [address] [place], [place] [address] [phone] Cell (Monday-Friday 8 AM - 5 PM) [phone] After 5 PM and follow prompts

## 2016-02-15 DIAGNOSIS — E785 Hyperlipidemia, unspecified: Secondary | ICD-10-CM

## 2016-02-15 DIAGNOSIS — I130 Hypertensive heart and chronic kidney disease with heart failure and stage 1 through stage 4 chronic kidney disease, or unspecified chronic kidney disease: Secondary | ICD-10-CM | POA: Insufficient documentation

## 2016-02-15 DIAGNOSIS — E1169 Type 2 diabetes mellitus with other specified complication: Secondary | ICD-10-CM | POA: Insufficient documentation

## 2016-02-19 ENCOUNTER — Ambulatory Visit (INDEPENDENT_AMBULATORY_CARE_PROVIDER_SITE_OTHER): Payer: Medicare Other

## 2016-02-19 ENCOUNTER — Ambulatory Visit (INDEPENDENT_AMBULATORY_CARE_PROVIDER_SITE_OTHER): Payer: Medicare Other | Admitting: Orthopaedic Surgery

## 2016-02-19 DIAGNOSIS — S42022A Displaced fracture of shaft of left clavicle, initial encounter for closed fracture: Secondary | ICD-10-CM

## 2016-02-19 NOTE — Progress Notes (Signed)
Office Visit Note   Patient: [name]           Date of Birth: Aug 03, 1940           MRN: [id number] Visit Date: 02/19/2016              Requested by: Alveda Reasons, MD [address] [place], [place] [address] PCP: [name], MD   Assessment & Plan: Visit Diagnoses:  1. Closed displaced fracture of shaft of left clavicle, initial encounter     Plan: Plan is for nonweightbearing for 4 more weeks with sling. He may begin gentle passive range of motion with occupational therapy. I will see him back in 4 weeks with repeat 2 view x-rays of left clavicle. We'll plan on releasing him at that time for strengthening and weightbearing as tolerated.  Follow-Up Instructions: Return in about 4 weeks (around 03/18/2016) for recheck left clavicle fx.   Orders:  Orders Placed This Encounter  Procedures  . XR Clavicle Left   No orders of the defined types were placed in this encounter.     Procedures: No procedures performed   Clinical Data: No additional findings.   Subjective: No chief complaint on file.   The patient is a 75 year old gentleman who comes in today from a skilled nursing facility for a nondisplaced left clavicle fracture. He had a fall on 02/09/2016 and x-ray showed a nondisplaced left clavicle fracture. 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 He states that he has a lot of pain and bruising. He is wearing a sling. The pain does not radiate. It's throbbing and constant and worse with movement.    Review of Systems  Constitutional: Negative.   HENT: Negative.   Eyes: Negative.   Respiratory: Negative.   Cardiovascular: Negative.   Gastrointestinal: Negative.   Endocrine: Negative.   Genitourinary: Negative.   Musculoskeletal: Negative.   Skin: Negative.   Allergic/Immunologic: Negative.   Neurological: Negative.   Hematological: Negative.   Psychiatric/Behavioral: Negative.      Objective: Vital Signs: There  were no vitals taken for this visit.  Physical Exam  Constitutional: He is oriented to person, place, and time. He appears well-developed and well-nourished.  HENT:  Head: Normocephalic and atraumatic.  Eyes: EOM are normal.  Neck: Neck supple.  Cardiovascular: Intact distal pulses.   Pulmonary/Chest: Effort normal.  Abdominal: Soft.  Neurological: He is alert and oriented to person, place, and time.  Skin: Skin is warm.  Psychiatric: He has a normal mood and affect. His behavior is normal. Judgment and thought content normal.  Nursing note and vitals reviewed.   Ortho Exam Exam of the left upper extremity and chest region shows extensive bruising and swelling. There is no skin ulcers. He is neurovascularly intact. Hand is warm well-perfused. Specialty Comments:  No specialty comments available.  Imaging: No results found.   PMFS History: Patient Active Problem List   Diagnosis Date Noted  . 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 Dyslipidemia associated with type 2 diabetes mellitus ([place]) 02/15/2016  . Sepsis affecting skin ([place])   . Cellulitis of right leg 01/24/2016  . Panlobular emphysema ([place])   . Gangrene ([place])   . Septic shock ([place]) 10/31/2015  . Hypokalemia   . Microcytic anemia 10/19/2015  . Atherosclerosis of native arteries of the extremities with ulceration ([place]) 08/03/2015  . Chronic pulmonary embolism ([place]) 06/18/2015  . Diabetic peripheral neuropathy associated with type 2 diabetes  mellitus ([place]) 06/18/2015  . Allergic rhinitis 05/22/2015  . Venous ulcer of left leg ([place]) 05/22/2015  . Constipation due to opioid therapy 05/06/2015  . Vitamin D deficiency 05/03/2015  . OA (osteoarthritis) 05/12/2014  . Diabetes type II with atherosclerosis of arteries of extremities ([place]) 05/12/2014  . GERD (gastroesophageal reflux disease) 11/10/2011  . Peripheral vascular disease ([place]) 04/16/2011  . Osteomyelitis ([place]) 03/13/2011    . Chronic obstructive pulmonary disease ([place]) 02/27/2011  . BPH (benign prostatic hyperplasia) 11/01/2010  . Long term current use of anticoagulant therapy 05/03/2010  . Bilateral shoulder pain 11/19/2009  . Depression 07/08/2006  . Essential hypertension 07/08/2006  . Anxiety state 05/21/2006  . Coronary atherosclerosis 05/21/2006  . Chronic systolic CHF (congestive heart failure) ([place]) 05/21/2006   Past Medical History:  Diagnosis Date  . Adhesive capsulitis   . Angina   . Anxiety   . CAD (coronary artery disease)   . CHF (congestive heart failure) ([place])   . Chronic back pain   . Chronic kidney disease    hx of BPH  . COPD (chronic obstructive pulmonary disease) ([place])   . CVA (cerebral infarction) Questionable history  . Depression   . Diabetes mellitus   . Diverticulosis   . Falls frequently 06/2014  . GERD (gastroesophageal reflux disease)   . Hyperlipidemia   . Hypertension   . 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 Myocardial infarction   . Neuromuscular disorder ([place])    HX of diabetic periferal neuropathy  . On home oxygen therapy    "1.5L prn" (04/12/2014)  . Osteomyelitis ([place]) 2012   Sternoclavicular joint   . PE (pulmonary embolism) 10-15 years ago   Lifelong Coumadin  . Peripheral vascular disease ([place])   . Shortness of breath   . Sleep apnea     Family History  Problem Relation Age of Onset  . Heart disease Father   . Heart disease Mother     Past Surgical History:  Procedure Laterality Date  . AMPUTATION  03/16/2011   Procedure: AMPUTATION DIGIT;  Surgeon: Angelia Mould, MD;  Location: [facility] OR;  Service: Vascular;  Laterality: Left;  Great toe  . AMPUTATION Left 11/08/2015   Procedure: AMPUTATION ABOVE KNEE LEFT;  Surgeon: Waynetta Sandy, MD;  Location: [facility];  Service: Vascular;  Laterality: Left;  . CARDIAC CATHETERIZATION  October 18, 2010   Known obstructive disease, no further blockages   . CARDIAC CATHETERIZATION  03/17/2005   EF 35-40%  . COLONOSCOPY  2006    Multiple polyps removed, repeat in 5 years  . CORONARY ARTERY BYPASS GRAFT  1992  . [place], 2002  . DOPPLER ECHOCARDIOGRAPHY  Sept 2011   EF 50-55% with some impaired diastolic relaxation  . FEMORAL-POPLITEAL BYPASS GRAFT  2008   Left  . FRACTURE SURGERY  1980s   Hip  . HIP ARTHROPLASTY Left   . PERIPHERAL VASCULAR CATHETERIZATION N/A 11/06/2015   Procedure: Abdominal Aortogram;  Surgeon: Serafina Mitchell, MD;  Location: [facility];  Service: Cardiovascular;  Laterality: N/A;  . PERIPHERAL VASCULAR CATHETERIZATION Bilateral 11/06/2015   Procedure: Lower Extremity Angiography;  Surgeon: Serafina Mitchell, MD;  Location: [facility] LAB;  Service: Cardiovascular;  Laterality: Bilateral;  . Right iliopopliteal bypass - [place]  . septic arthritis     Removal of infected CABG wire by Dr Arlyce Dice - 2011  . SPINE SURGERY  2002   Cervical fusion vertebroplasty C3-4-5  . US ECHOCARDIOGRAPHY  03/04/2006   EF 50-55%   Social History   Occupational History  . Retired-machinest/welder Retired   Social History Main Topics  . Smoking status: Former Smoker    Quit date: 06/19/2000  . Smokeless tobacco: Never Used  . Alcohol use No  . Drug use: No  . Sexual activity: No

## 2016-02-27 LAB — VITAMIN B12: VITAMIN B 12: 337

## 2016-03-03 ENCOUNTER — Other Ambulatory Visit: Payer: Self-pay | Admitting: *Deleted

## 2016-03-03 MED ORDER — HYDROCODONE-ACETAMINOPHEN 7.5-325 MG PO TABS: ORAL_TABLET | ORAL | 0 refills | Status: DC

## 2016-03-03 NOTE — Telephone Encounter (Signed)
[facility] #: [phone] Fax#: [phone]  

## 2016-03-10 ENCOUNTER — Encounter: Payer: Self-pay | Admitting: Adult Health

## 2016-03-10 ENCOUNTER — Non-Acute Institutional Stay (SKILLED_NURSING_FACILITY): Payer: Medicare Other | Admitting: Adult Health

## 2016-03-10 DIAGNOSIS — I5022 Chronic systolic (congestive) heart failure: Secondary | ICD-10-CM | POA: Diagnosis not present

## 2016-03-10 DIAGNOSIS — I2782 Chronic pulmonary embolism: Secondary | ICD-10-CM

## 2016-03-10 DIAGNOSIS — E785 Hyperlipidemia, unspecified: Secondary | ICD-10-CM

## 2016-03-10 DIAGNOSIS — I130 Hypertensive heart and chronic kidney disease with heart failure and stage 1 through stage 4 chronic kidney disease, or unspecified chronic kidney disease: Secondary | ICD-10-CM

## 2016-03-10 DIAGNOSIS — E1142 Type 2 diabetes mellitus with diabetic polyneuropathy: Secondary | ICD-10-CM

## 2016-03-10 DIAGNOSIS — E1169 Type 2 diabetes mellitus with other specified complication: Secondary | ICD-10-CM | POA: Diagnosis not present

## 2016-03-10 DIAGNOSIS — I70209 Unspecified atherosclerosis of native arteries of extremities, unspecified extremity: Secondary | ICD-10-CM | POA: Diagnosis not present

## 2016-03-10 DIAGNOSIS — E1151 Type 2 diabetes mellitus with diabetic peripheral angiopathy without gangrene: Secondary | ICD-10-CM

## 2016-03-10 DIAGNOSIS — J449 Chronic obstructive pulmonary disease, unspecified: Secondary | ICD-10-CM | POA: Diagnosis not present

## 2016-03-10 NOTE — Progress Notes (Signed)
Patient ID: [name], male   DOB: [birth year], 75 y.o.   MRN: [id number]   Location:   [facility] Room Number: 159-B Place of Service:  SNF (31)   CODE STATUS: DNR  Allergies  Allergen Reactions  . Diazepam Anxiety    REACTION: makes patient cry  . Vancomycin Other (See Comments)    "red man syndrome"    Chief Complaint  Patient presents with  . Medical Management of Chronic Issues    Follow up    HPI:  He is a long term resident of this facility being seen for the management of his chronic illnesses. There is no significant change in his status. He does spend most of his time in his bed per his choice. He is not voicing any complaints today. There are no nursing concerns at this time.    Past Medical History:  Diagnosis Date  . Adhesive capsulitis   . Angina   . Anxiety   . CAD (coronary artery disease)   . CHF (congestive heart failure) ([place])   . Chronic back pain   . Chronic kidney disease    hx of BPH  . COPD (chronic obstructive pulmonary disease) ([place])   . CVA (cerebral infarction) Questionable history  . Depression   . Diabetes mellitus   . Diverticulosis   . Falls frequently 06/2014  . GERD (gastroesophageal reflux disease)   . Hyperlipidemia   . Hypertension   . 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 Myocardial infarction   . Neuromuscular disorder ([place])    HX of diabetic periferal neuropathy  . On home oxygen therapy    "1.5L prn" (04/12/2014)  . Osteomyelitis ([place]) 2012   Sternoclavicular joint   . PE (pulmonary embolism) 10-15 years ago   Lifelong Coumadin  . Peripheral vascular disease ([place])   . Shortness of breath   . Sleep apnea     Past Surgical History:  Procedure Laterality Date  . AMPUTATION  03/16/2011   Procedure: AMPUTATION DIGIT;  Surgeon: Angelia Mould, MD;  Location: [facility] OR;  Service: Vascular;  Laterality: Left;  Great toe  . AMPUTATION Left 11/08/2015   Procedure: AMPUTATION ABOVE KNEE LEFT;  Surgeon: Waynetta Sandy, MD;  Location: [facility];  Service: Vascular;  Laterality: Left;  . CARDIAC CATHETERIZATION  October 18, 2010   Known obstructive disease, no further blockages  . CARDIAC CATHETERIZATION  03/17/2005   EF 35-40%  . COLONOSCOPY  2006    Multiple polyps removed, repeat in 5 years  . CORONARY ARTERY BYPASS GRAFT  1992  . [place], 2002  . DOPPLER ECHOCARDIOGRAPHY  Sept 2011   EF 50-55% with some impaired diastolic relaxation  . FEMORAL-POPLITEAL BYPASS GRAFT  2008   Left  . FRACTURE SURGERY  1980s   Hip  . HIP ARTHROPLASTY Left   . PERIPHERAL VASCULAR CATHETERIZATION N/A 11/06/2015   Procedure: Abdominal Aortogram;  Surgeon: Serafina Mitchell, MD;  Location: [facility] CV LAB;  Service: Cardiovascular;  Laterality: N/A;  . PERIPHERAL VASCULAR CATHETERIZATION Bilateral 11/06/2015   Procedure: Lower Extremity Angiography;  Surgeon: Serafina Mitchell, MD;  Location: [facility] CV LAB;  Service: Cardiovascular;  Laterality: Bilateral;  . Right iliopopliteal bypass - [place]  . septic arthritis     Removal of infected CABG wire by Dr Arlyce Dice - 2011  . SPINE SURGERY  2002   Cervical fusion vertebroplasty  C3-4-5  . US ECHOCARDIOGRAPHY  03/04/2006   EF 50-55%    Social History   Social History  . Marital status: Married    Spouse name: Butch Penny  . Number of children: 3  . Years of education: 12   Occupational History  . Retired-machinest/welder Retired   Social History Main Topics  . Smoking status: Former Smoker    Quit date: 06/19/2000  . Smokeless tobacco: Never Used  . Alcohol use No  . Drug use: No  . Sexual activity: No   Other Topics Concern  . Not on file   Social History Narrative   Health Care POA: TBD   Emergency Contact: wife, Butch Penny [phone]   End of Life Plan: pt does not have AD and not interested.   Who lives with you: Butch Penny and step-daughter-Betty   Any pets: none   Diet:  Pt has a varied diet of protein, starch and vegetables.   Exercise: Pt does not have regular exercise routine.   Seatbelts: Pt reports wearing seatbelt when in vehicles.    Hobbies: listening to car races.             Family History  Problem Relation Age of Onset  . Heart disease Father   . Heart disease Mother       VITAL SIGNS BP 126/78   Pulse 74   Temp 98.4 F (36.9 C) (Oral)   Resp 18   Ht 5\' 6"  (1.676 m)   Wt 223 lb (101.2 kg)   SpO2 96%   BMI 35.99 kg/m   Patient's Medications  New Prescriptions   No medications on file  Previous Medications   ACETAMINOPHEN (TYLENOL) 325 MG TABLET    Take 650 mg by mouth every 6 (six) hours as needed for fever.   AMINO ACIDS-PROTEIN HYDROLYS (FEEDING SUPPLEMENT, PRO-STAT SUGAR FREE 64,) LIQD    Take 30 mLs by mouth 2 (two) times daily.   ASPIRIN EC 81 MG EC TABLET    Take 1 tablet (81 mg total) by mouth daily.   ATORVASTATIN (LIPITOR) 20 MG TABLET    Take 20 mg by mouth daily.   BISACODYL (DULCOLAX) 10 MG SUPPOSITORY    Place 1 suppository (10 mg total) rectally daily as needed for moderate constipation.   CARVEDILOL (COREG) 3.125 MG TABLET    Take 1 tablet (3.125 mg total) by mouth 2 (two) times daily with a meal.   CETIRIZINE (ZYRTEC ALLERGY) 10 MG TABLET    Take 10 mg by mouth daily.   COLLAGENASE (SANTYL) OINTMENT    Apply 1 application topically daily. Apply Santyl to left and right heel wounds Q day, then cover with moist gauze and dry kerlex   DOCUSATE SODIUM (COLACE) 100 MG CAPSULE    Take 100 mg by mouth 2 (two) times daily.   DULOXETINE (CYMBALTA) 30 MG CAPSULE    Take 90 mg by mouth daily.   FERROUS SULFATE 325 (65 FE) MG TABLET    Take 1 tablet (325 mg total) by mouth daily with breakfast.   FUROSEMIDE (LASIX) 80 MG TABLET    Take 1 tablet (80 mg total) by mouth 2 (two) times daily.   GABAPENTIN (NEURONTIN) 600 MG TABLET    Take 600 mg by mouth 3 (three) times daily.   GUAIFENESIN-DEXTROMETHORPHAN (ROBITUSSIN DM) 100-10  MG/5ML SYRUP    Take 15 mLs by mouth every 4 (four) hours as needed for cough.   HYDROCODONE-ACETAMINOPHEN (NORCO) 7.5-325 MG TABLET    Take one  tablet by mouth every 4 hours as needed for pain, DNE 3gm of APAP from all sources/24hours.   INSULIN LISPRO (HUMALOG) 100 UNIT/ML CARTRIDGE    Inject into the skin. Inject as per sliding scale: if  0-70=0; 71-149 = 8 units, 150-600= 13 units subcutaneously before meals related to DM.   INSULIN NPH HUMAN (HUMULIN N,NOVOLIN N) 100 UNIT/ML INJECTION    Inject 0.2 mLs (20 Units total) into the skin 2 (two) times daily before a meal.   LIDOCAINE (LIDODERM) 5 %    Place 1 patch onto the skin daily.    LORAZEPAM (ATIVAN) 0.5 MG TABLET    Take 1 tablet (0.5 mg total) by mouth 2 (two) times daily as needed for anxiety.   LUBIPROSTONE (AMITIZA) 8 MCG CAPSULE    Take 8 mcg by mouth 2 (two) times daily with a meal.   METHOCARBAMOL (ROBAXIN) 500 MG TABLET    Take 1 tablet (500 mg total) by mouth 3 (three) times daily.   MULTIPLE VITAMIN (MULTIVITAMIN WITH MINERALS) TABS TABLET    Take 1 tablet by mouth daily.   NITROGLYCERIN (NITROSTAT) 0.4 MG SL TABLET    Place 1 tablet (0.4 mg total) under the tongue as needed. For chest pains   OMEPRAZOLE (PRILOSEC) 40 MG CAPSULE    Take 1 capsule (40 mg total) by mouth daily.   OXYGEN    Inhale 2 L into the lungs as needed (for congestion, cough, and wheezing related to COPD).   POLYETHYLENE GLYCOL (MIRALAX / GLYCOLAX) PACKET    Take 17 g by mouth daily.   POTASSIUM CHLORIDE SA (K-DUR,KLOR-CON) 20 MEQ TABLET    Take 20 mEq by mouth daily.    PROPYLENE GLYCOL (SYSTANE BALANCE) 0.6 % SOLN    Place 1 drop into both eyes 2 (two) times daily.    RIVAROXABAN (XARELTO) 20 MG TABS TABLET    Take 20 mg by mouth daily with supper.   SENNA-DOCUSATE (SENOKOT-S) 8.6-50 MG TABLET    Take 1 tablet by mouth 2 (two) times daily.   SKIN PROTECTANTS, MISC. (CALAZIME SKIN PROTECTANT EX)    Apply topically. Apply to sacrum and right buttock every day  shift   TAMSULOSIN (FLOMAX) 0.4 MG CAPS CAPSULE    Take 1 capsule (0.4 mg total) by mouth daily after supper.   TRAZODONE (DESYREL) 100 MG TABLET    Take 100 mg by mouth at bedtime.    TRIAMCINOLONE CREAM (KENALOG) 0.1 %    Apply 1 application topically 2 (two) times daily.   VITAMIN D, ERGOCALCIFEROL, (DRISDOL) 50000 UNITS CAPS CAPSULE    Take 1 capsule (50,000 Units total) by mouth every 7 (seven) days.   VITAMINS A & D (VITAMIN A & D) OINTMENT    Apply 1 application topically 2 (two) times daily as needed for dry skin.  Modified Medications   No medications on file  Discontinued Medications     SIGNIFICANT DIAGNOSTIC EXAMS  10-22-15: 2-d echo: - Left ventricle: The cavity size was mildly dilated. Wall thickness was increased in a pattern of mild LVH. Systolic function was moderately to severely reduced. The estimated ejection fraction was in the range of 30% to 35%. Difficult images even with Definity. There appears to be severe hypokinesis of the inferior and inferolateral walls. Features are consistent with a pseudonormal left ventricular filling pattern, with concomitant abnormal relaxation and increased filling pressure  (grade 2 diastolic dysfunction). - Aortic valve: Trileaflet; moderately calcified leaflets. There was no stenosis. - Mitral valve:  Moderately calcified annulus. Mildly calcified leaflets . There was moderate regurgitation. - Left atrium: The atrium was mildly dilated. - Right ventricle: Poorly visualized. The cavity size was normal. Wall thickness was normal. - Tricuspid valve: Peak RV-RA gradient (S): 24 mm Hg. - Pulmonary arteries: PA peak pressure: 39 mm Hg (S). - Systemic veins: IVC measured 2.6 cm with < 50% respirophasic  variation, suggesting RA pressure 15 mmHg. Impressions: - Mildly dilated LV with mild LV hypertrophy. Difficult images even with Definity, but EF appears to be 30-35% with severe hypokinesis of the inferior and inferolateral walls. Moderate  diastolic dysfunction. The RV was poorly visualized, probably mildly decreased systolic function. Moderate mitral regurgitation. Dilated IVC with mild pulmonary hypertension.  10-31-15: ct of head: 1. No acute finding or change compared to prior. 2. Remote left MCA territory infarction. Remote small bilateral cerebellar infarcts.  01-24-16: chest x-ray: No acute process or explanation for sepsis Cardiomegaly with left hemidiaphragm elevation and adjacent volume loss/atelectasis.  01-24-16: right tibia/fibula x-ray: 1. No evidence of fracture or dislocation. 2. Mild degenerative change at the medial compartment of the right knee. 3. Diffuse vascular calcifications seen.  01-26-16: right foot x-ray: No acute finding or focal bone loss by plain radiography.  02-13-16: left shoulder x-ray: a lucent line at the distal clavicle   02-15-16: left shoulder x-ray: recent left mid clavicle fracture without visible callus  LABS REVIEWED:   02-23-15: tsh 4.032; vit d 11  03-08-15: wbc 7.7; hgb 11.5; hct 35.7; mcv 79.7; plt 156; glucose 115; bun 19; creat 0.96; k+ 4.6; na++146; liver normal albumin 3.4 INR 1.66  04-04-15: glucose 136; bun 19; creat 1.02; k+ 3.7; na++142 06-04-15: INR 2.77  06-18-15: INR 2.41  Urine for micro-albumin 0.7  06-25-15: wbc 8.0; hgb 12.1; hct 37.9 ;mcv 78.6; plt 159; glucose 122; bun 20; creat 0.93; k+ 4.3; na++142 liver normal albumin 3.6; chol 151; ldl 87; trig 162; hdl 32; hgb a1c 7.4 07-26-15: wbc 7.7; hgb 12.;3 hct 37.2; mcv 77.3; plt 106; glucose 161; bun 37; creat 1.75; k+ 4.3; na++136  07-30-15: wbc 12.8; hgb 10.7; hct 3.2; mcv 77.4; plt 174; g glucose 120; bun 43; creat 1.22; k+ 4.3; na++134 inr >10 (question reading of inr)  12-19-15: glucose 217; bun 13; creat 0.74; k+ 3.0; na++ 142; liver normal albumin 3.3; hgb a1c 5.7 12-26-15: glucose 103; bun 15; creat 0.73; k+ 3.2; na++ 142; liver normal albumin 3.2  01-04-16: k+ 3.5  01-14-16: glucose 131; bun 10.7; creat 0.62; k+ 3.8;  na++ 143; liver normal albumin 3.5 hgb a1c 5.9  01-24-16: wbc 35.1; hgb 11.5; hct 36.2; mcv 75.9; plt 288; glucose 181; bun 17; creat 0.99; k+ 2.9; na++ 131; liver normal albumin 2.7; urine culture: no growth 01-26-16: wbc 31.6; hgb 11.5; hct 37.4; mcv 77.4; plt 313; glucose 155; bun 13; creat 0.64; k+ 4.5 ;na++ 137; mag 2.3; phos 3.3  01-28-16: wbc 11.7 ;hgb 10.5; hct 33.5 ;mcv 75.5; plt 280; glucose 193; bun 12; creat 0.73; k+ 3.8; na++ 138; phos 2.9 01-29-16: wbc 12.5; hgb 10.4; hct 34.5; mcv 76.8; pt 292  01-31-16: wbc 16.6; hgb 11.8; hct 39.7; mcv 79.7; plt 325;  02-01-16: glucose 161; bun 16.3; creat 0.56; k+ 3.6; na++ 143; liver normal albumin 3.3  12 -6-17: vit B 12: 337; mag 1.8     Review of Systems Constitutional: does have fatigue   Respiratory: no cough or shortness of breath  Cardiovascular: Negative for chest pain, palpitations and leg swelling.  Gastrointestinal:  Negative for heartburn, abdominal pain and constipation.  Musculoskeletal: chronic back pain Skin: drainage on incision line    Neurological: Negative for headaches.  Psychiatric/Behavioral: . The patient is not nervous/anxious    Physical Exam Constitutional: He is oriented to person, place, and time. No distress.  Obese   Neck: Neck supple. No JVD present. No thyromegaly present.  Cardiovascular: Normal rate, regular rhythm and intact distal pulses.   Respiratory: Effort normal and breath sounds normal. No respiratory distress.  GI: Soft. Bowel sounds are normal. He exhibits no distension. There is no tenderness.  Musculoskeletal:  Is status post left aka  Is able to move all extremities 1+ right lower extremity edema   Neurological: He is alert and oriented to person, place, and time.  Skin: Skin is warm and dry. He is not diaphoretic.  right heel; stage IV: 4.5 x 3.6 x 0.4 cm calcium alginate  Left medial thigh 1.2 x 1.6 x 0.11cm  Left lateral thigh: 3.0 x 1.8 x 0.12 cm Left anterior thigh: 3.6 x 2.4 x  0.15 cm  These are being treated with wound vac   Psychiatric: He has a normal mood and affect.    ASSESSMENT/ PLAN:   1. Chronic systolic heart failure: EF 30-35% (10-22-15): will continue lasix 80 mg twice daily with k+ 20 meq  daily coreg 3.125 mg twice daily   2. Hypertension: will continue coreg 3.125 mg twice daily   3. CAD: status post CABG and stents: will continue asa 81 mg daily and ntg prn  4. Chronic PE: is stable will continue xarelto 20 mg daily  5. Dyslipidemia: ldl 87 will continue liptor 20 mg daily   6. Diabetes: hgb a1c 5.7; will continue NPH 20 units twice daily and humalog 8 units with meals with an additional 3 units for cbg >150  7. PVD: will continue asa 81 mg is status post left aka  Will continue vicodin 7.5/325 mg every 4 hours as needed   8. Anemia: hgb 10.4 will continue iron daily   9. COPD: is on chronic 02 therapy;   10. Constipation: will continue colace twice daily senna s twice daily miralax daily and  amitiza 8 mcg twice daily   11. Peripheral neuropathy: will continue cymbalta 90 mg daily neurontin 600 mg three times daily robaxin 500 mg three times daily for spasms  12. Anxiety with depression: will continue cymbalta 90 mg daily also takes for pain; takes trazodone 100 mg nightly will continue ativan 0.5 mg twice daily as needed  13. Gerd: will continue prilosec 40 mg daily  14. Bilateral shoulder pain: will continue lidoderm patch; will continue vicodin 7.5/325 mg every 4 hours as needed   15. Vit D deficiency: will continue vitamin d 50,000 units weekly   16. BPH: will continue flomax 0.4 mg daily    MD is aware of resident's narcotic use and is in agreement with current plan of care. We will attempt to wean resident as apropriate   Ok Edwards NP [facility]  Contact [phone] Monday through Friday 8am- 5pm  After hours call [phone]

## 2016-03-13 ENCOUNTER — Encounter: Payer: Self-pay | Admitting: Vascular Surgery

## 2016-03-20 ENCOUNTER — Ambulatory Visit (INDEPENDENT_AMBULATORY_CARE_PROVIDER_SITE_OTHER): Payer: Medicare Other | Admitting: Orthopaedic Surgery

## 2016-03-25 ENCOUNTER — Encounter (INDEPENDENT_AMBULATORY_CARE_PROVIDER_SITE_OTHER): Payer: Self-pay

## 2016-03-25 ENCOUNTER — Encounter (INDEPENDENT_AMBULATORY_CARE_PROVIDER_SITE_OTHER): Payer: Self-pay | Admitting: Orthopaedic Surgery

## 2016-03-25 ENCOUNTER — Ambulatory Visit (INDEPENDENT_AMBULATORY_CARE_PROVIDER_SITE_OTHER): Payer: Medicare Other

## 2016-03-25 ENCOUNTER — Ambulatory Visit (INDEPENDENT_AMBULATORY_CARE_PROVIDER_SITE_OTHER): Payer: Medicare Other | Admitting: Orthopaedic Surgery

## 2016-03-25 DIAGNOSIS — S42022A Displaced fracture of shaft of left clavicle, initial encounter for closed fracture: Secondary | ICD-10-CM | POA: Diagnosis not present

## 2016-03-25 DIAGNOSIS — M25512 Pain in left shoulder: Secondary | ICD-10-CM

## 2016-03-25 NOTE — Progress Notes (Signed)
6 weeks s/p left clavicle fracture.  Improving overall.  Has constant mild pain.  Doing exercises in his bed.  xrays show stable alignment.  Begin ROM, strengthening, WBAT with PT/OT.  F/u prn.

## 2016-03-26 ENCOUNTER — Ambulatory Visit (INDEPENDENT_AMBULATORY_CARE_PROVIDER_SITE_OTHER): Payer: Medicare Other | Admitting: Vascular Surgery

## 2016-03-26 ENCOUNTER — Encounter: Payer: Self-pay | Admitting: Vascular Surgery

## 2016-03-26 VITALS — BP 107/70 | HR 69 | Temp 98.2°F | Resp 18 | Ht 68.0 in | Wt 228.0 lb

## 2016-03-26 DIAGNOSIS — I779 Disorder of arteries and arterioles, unspecified: Secondary | ICD-10-CM | POA: Diagnosis not present

## 2016-03-26 NOTE — Progress Notes (Signed)
Patient ID: [name], male   DOB: 26-Feb-1941, 76 y.o.   MRN: [id number]  Reason for Consult: Re-evaluation (PAOD  )   Referred by Alveda Reasons, MD  Subjective:     HPI:  [name] is a 76 y.o. male follows up from recent hospitalist consultation. He is followed in my office for left above-the-knee of dictation site that had to be opened up and now has a wound VAC on it. He also has a right heel ulcer and angiogram has been performed in the past and he does not have reconstructed bone disease on the right side. He remains at his nursing facility where he has recently had a fall and a left clavicle fracture for which he is doing well. He states that changing his wound VAC every few days. He does not think at this time he is on antibiotics. He denies any fevers states that he is eating well and is overall progressing as expected.  Past Medical History:  Diagnosis Date  . Adhesive capsulitis   . Angina   . Anxiety   . CAD (coronary artery disease)   . CHF (congestive heart failure) ([place])   . Chronic back pain   . Chronic kidney disease    hx of BPH  . COPD (chronic obstructive pulmonary disease) ([place])   . CVA (cerebral infarction) Questionable history  . Depression   . Diabetes mellitus   . Diverticulosis   . Falls frequently 06/2014  . GERD (gastroesophageal reflux disease)   . Hyperlipidemia   . Hypertension   . 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 Myocardial infarction   . Neuromuscular disorder ([place])    HX of diabetic periferal neuropathy  . On home oxygen therapy    "1.5L prn" (04/12/2014)  . Osteomyelitis ([place]) 2012   Sternoclavicular joint   . PE (pulmonary embolism) 10-15 years ago   Lifelong Coumadin  . Peripheral vascular disease ([place])   . Shortness of breath   . Sleep apnea    Family History  Problem Relation Age of Onset  . Heart disease Father   . Heart disease Mother    Past Surgical History:    Procedure Laterality Date  . AMPUTATION  03/16/2011   Procedure: AMPUTATION DIGIT;  Surgeon: Angelia Mould, MD;  [facility];  Service: Vascular;  Laterality: Left;  Great toe  . AMPUTATION Left 11/08/2015   Procedure: AMPUTATION ABOVE KNEE LEFT;  Surgeon: Waynetta Sandy, MD;  Location: [facility];  Service: Vascular;  Laterality: Left;  . CARDIAC CATHETERIZATION  October 18, 2010   Known obstructive disease, no further blockages  . CARDIAC CATHETERIZATION  03/17/2005   EF 35-40%  . COLONOSCOPY  2006    Multiple polyps removed, repeat in 5 years  . CORONARY ARTERY BYPASS GRAFT  1992  . [place], 2002  . DOPPLER ECHOCARDIOGRAPHY  Sept 2011   EF 50-55% with some impaired diastolic relaxation  . FEMORAL-POPLITEAL BYPASS GRAFT  2008   Left  . FRACTURE SURGERY  1980s   Hip  . HIP ARTHROPLASTY Left   . PERIPHERAL VASCULAR CATHETERIZATION N/A 11/06/2015   Procedure: Abdominal Aortogram;  Surgeon: Serafina Mitchell, MD;  Location: [facility];  Service: Cardiovascular;  Laterality: N/A;  . PERIPHERAL VASCULAR CATHETERIZATION Bilateral 11/06/2015   Procedure: Lower Extremity Angiography;  Surgeon: Serafina Mitchell, MD;  Location: [facility];  Service: Cardiovascular;  Laterality: Bilateral;  . Right iliopopliteal bypass - [place]  . septic arthritis     Removal of infected CABG wire by Dr Arlyce Dice - 2011  . SPINE SURGERY  2002   Cervical fusion vertebroplasty C3-4-5  . US ECHOCARDIOGRAPHY  03/04/2006   EF 50-55%    Short Social History:  Social History  Substance Use Topics  . Smoking status: Former Smoker    Quit date: 06/19/2000  . Smokeless tobacco: Never Used  . Alcohol use No    Allergies  Allergen Reactions  . Diazepam Anxiety    REACTION: makes patient cry  . Vancomycin Other (See Comments)    "red man syndrome"    Current Outpatient Prescriptions  Medication Sig Dispense Refill  . apixaban (ELIQUIS) 5 MG TABS tablet  Take 5 mg by mouth 2 (two) times daily.    Marland Kitchen docusate sodium (COLACE) 100 MG capsule Take 100 mg by mouth 2 (two) times daily.    . DULoxetine (CYMBALTA) 30 MG capsule Take 90 mg by mouth daily.    . ferrous sulfate 325 (65 FE) MG tablet Take 1 tablet (325 mg total) by mouth daily with breakfast. 30 tablet 0  . gabapentin (NEURONTIN) 600 MG tablet Take 300 mg by mouth 2 (two) times daily.     . insulin lispro (HUMALOG) 100 UNIT/ML cartridge Inject into the skin. Inject as per sliding scale: if  0-70=0; 71-149 = 8 units, 150-600= 13 units subcutaneously before meals related to DM.    Marland Kitchen insulin NPH Human (HUMULIN N,NOVOLIN N) 100 UNIT/ML injection Inject 0.2 mLs (20 Units total) into the skin 2 (two) times daily before a meal. (Patient taking differently: Inject 20 Units into the skin 2 (two) times daily before a meal. 20 units before meals) 10 mL 0  . LORazepam (ATIVAN) 0.5 MG tablet Take 1 tablet (0.5 mg total) by mouth 2 (two) times daily as needed for anxiety. 20 tablet 0  . Multiple Vitamin (MULTIVITAMIN WITH MINERALS) TABS tablet Take 1 tablet by mouth daily.    . nitroGLYCERIN (NITROSTAT) 0.4 MG SL tablet Place 1 tablet (0.4 mg total) under the tongue as needed. For chest pains 30 tablet 6  . OXYGEN Inhale 2 L into the lungs as needed (for congestion, cough, and wheezing related to COPD).    Marland Kitchen polyethylene glycol (MIRALAX / GLYCOLAX) packet Take 17 g by mouth daily.    . potassium chloride SA (K-DUR,KLOR-CON) 20 MEQ tablet Take 20 mEq by mouth daily.     Marland Kitchen Propylene Glycol (SYSTANE BALANCE) 0.6 % SOLN Place 1 drop into both eyes 2 (two) times daily.     . Skin Protectants, Misc. (CALAZIME SKIN PROTECTANT EX) Apply topically. Apply to sacrum and right buttock every day shift    . traZODone (DESYREL) 100 MG tablet Take 100 mg by mouth at bedtime.     . Vitamin D, Ergocalciferol, (DRISDOL) 50000 units CAPS capsule Take 1 capsule (50,000 Units total) by mouth every 7 (seven) days. 30 capsule prn  .  acetaminophen (TYLENOL) 325 MG tablet Take 650 mg by mouth every 6 (six) hours as needed for fever.    . Amino Acids-Protein Hydrolys (FEEDING SUPPLEMENT, PRO-STAT SUGAR FREE 64,) LIQD Take 30 mLs by mouth 2 (two) times daily. 900 mL 0  . aspirin EC 81 MG EC tablet Take 1 tablet (81 mg total) by mouth daily.    Marland Kitchen atorvastatin (LIPITOR) 20 MG tablet Take 20 mg by mouth daily.    Marland Kitchen  bisacodyl (DULCOLAX) 10 MG suppository Place 1 suppository (10 mg total) rectally daily as needed for moderate constipation. (Patient not taking: Reported on 03/26/2016) 12 suppository 0  . carvedilol (COREG) 3.125 MG tablet Take 1 tablet (3.125 mg total) by mouth 2 (two) times daily with a meal. 60 tablet 0  . cetirizine (ZYRTEC ALLERGY) 10 MG tablet Take 10 mg by mouth daily.    . collagenase (SANTYL) ointment Apply 1 application topically daily. Apply Santyl to left and right heel wounds Q day, then cover with moist gauze and dry kerlex 15 g 0  . furosemide (LASIX) 80 MG tablet Take 1 tablet (80 mg total) by mouth 2 (two) times daily. (Patient not taking: Reported on 03/26/2016) 60 tablet 0  . guaiFENesin-dextromethorphan (ROBITUSSIN DM) 100-10 MG/5ML syrup Take 15 mLs by mouth every 4 (four) hours as needed for cough. (Patient not taking: Reported on 03/26/2016) 118 mL 0  . HYDROcodone-acetaminophen (NORCO) 7.5-325 MG tablet Take one tablet by mouth every 4 hours as needed for pain, DNE 3gm of APAP from all sources/24hours. (Patient not taking: Reported on 03/26/2016) 180 tablet 0  . lidocaine (LIDODERM) 5 % Place 1 patch onto the skin daily.     Marland Kitchen lubiprostone (AMITIZA) 8 MCG capsule Take 8 mcg by mouth 2 (two) times daily with a meal.    . methocarbamol (ROBAXIN) 500 MG tablet Take 1 tablet (500 mg total) by mouth 3 (three) times daily. (Patient not taking: Reported on 03/26/2016) 30 tablet 0  . omeprazole (PRILOSEC) 40 MG capsule Take 1 capsule (40 mg total) by mouth daily. (Patient not taking: Reported on 03/26/2016) 30 capsule 3    . rivaroxaban (XARELTO) 20 MG TABS tablet Take 20 mg by mouth daily with supper.    . senna-docusate (SENOKOT-S) 8.6-50 MG tablet Take 1 tablet by mouth 2 (two) times daily. (Patient not taking: Reported on 03/26/2016)    . tamsulosin (FLOMAX) 0.4 MG CAPS capsule Take 1 capsule (0.4 mg total) by mouth daily after supper. (Patient not taking: Reported on 03/26/2016) 90 capsule 1  . triamcinolone cream (KENALOG) 0.1 % Apply 1 application topically 2 (two) times daily.    . Vitamins A & D (VITAMIN A & D) ointment Apply 1 application topically 2 (two) times daily as needed for dry skin.     No current facility-administered medications for this visit.     Review of Systems  Constitutional: Positive for fatigue.  Eyes: Eyes negative.  Respiratory: Respiratory negative.  Cardiovascular: Cardiovascular negative.  GI: Gastrointestinal negative.  Musculoskeletal: Musculoskeletal negative.  Skin: Positive for wound.  Neurological: Neurological negative. Hematologic: Hematologic/lymphatic negative.  Psychiatric: Psychiatric negative.        Objective:  Objective   Vitals:   03/26/16 1436  BP: 107/70  Pulse: 69  Resp: 18  Temp: 98.2 F (36.8 C)  SpO2: 95%  Weight: 228 lb (103.4 kg)  Height: 5\' 8"  (1.727 m)   Body mass index is 34.67 kg/m.  Physical Exam  Constitutional: He is oriented to person, place, and time. He appears well-developed.  Eyes: EOM are normal.  Neck: Normal range of motion.  Pulmonary/Chest: Effort normal.  Abdominal: Soft.  Musculoskeletal:  Left aka site with granulation tissue and bleeding with vac removal Right heel stable lateral ulceration  Neurological: He is alert and oriented to person, place, and time.  Psychiatric: He has a normal mood and affect. His behavior is normal. Judgment and thought content normal.    Data: No studies today  Assessment/Plan:    76 year old male returns for evaluation of his left above-knee amputation site wound as  well as right heel wound. He is actually progressing quite well and his wound is healing with good granulation tissue and evidence of bleeding. He should continue his wound VAC for the meantime does not require antibiotics from a vascular standpoint and will follow up in 3 months for wound check at which point hopefully he will not require a wound VAC. He needs to be seen sooner or we can certainly do that as well.     Waynetta Sandy MD [facility]

## 2016-04-24 ENCOUNTER — Non-Acute Institutional Stay (SKILLED_NURSING_FACILITY): Payer: Medicare Other | Admitting: Adult Health

## 2016-04-24 DIAGNOSIS — Z7901 Long term (current) use of anticoagulants: Secondary | ICD-10-CM | POA: Diagnosis not present

## 2016-04-24 DIAGNOSIS — E1169 Type 2 diabetes mellitus with other specified complication: Secondary | ICD-10-CM

## 2016-04-24 DIAGNOSIS — I2782 Chronic pulmonary embolism: Secondary | ICD-10-CM

## 2016-04-24 DIAGNOSIS — E1142 Type 2 diabetes mellitus with diabetic polyneuropathy: Secondary | ICD-10-CM

## 2016-04-24 DIAGNOSIS — I7025 Atherosclerosis of native arteries of other extremities with ulceration: Secondary | ICD-10-CM | POA: Diagnosis not present

## 2016-04-24 DIAGNOSIS — E1151 Type 2 diabetes mellitus with diabetic peripheral angiopathy without gangrene: Secondary | ICD-10-CM

## 2016-04-24 DIAGNOSIS — Z89612 Acquired absence of left leg above knee: Secondary | ICD-10-CM

## 2016-04-24 DIAGNOSIS — I130 Hypertensive heart and chronic kidney disease with heart failure and stage 1 through stage 4 chronic kidney disease, or unspecified chronic kidney disease: Secondary | ICD-10-CM

## 2016-04-24 DIAGNOSIS — I5022 Chronic systolic (congestive) heart failure: Secondary | ICD-10-CM | POA: Diagnosis not present

## 2016-04-24 DIAGNOSIS — I70209 Unspecified atherosclerosis of native arteries of extremities, unspecified extremity: Secondary | ICD-10-CM

## 2016-04-24 DIAGNOSIS — T402X5A Adverse effect of other opioids, initial encounter: Secondary | ICD-10-CM

## 2016-04-24 DIAGNOSIS — K5903 Drug induced constipation: Secondary | ICD-10-CM

## 2016-04-24 DIAGNOSIS — J449 Chronic obstructive pulmonary disease, unspecified: Secondary | ICD-10-CM

## 2016-04-24 DIAGNOSIS — E785 Hyperlipidemia, unspecified: Secondary | ICD-10-CM

## 2016-04-25 LAB — HEMOGLOBIN A1C: Hemoglobin A1C: 6.2

## 2016-04-29 ENCOUNTER — Observation Stay (HOSPITAL_COMMUNITY): Payer: Medicare Other

## 2016-04-29 ENCOUNTER — Observation Stay (HOSPITAL_COMMUNITY)
Admission: EM | Admit: 2016-04-29 | Discharge: 2016-05-01 | Disposition: A | Discharge status: Skilled Nursing Facility | Payer: Medicare Other | Attending: Family Medicine | Admitting: Family Medicine

## 2016-04-29 ENCOUNTER — Encounter: Payer: Self-pay | Admitting: Internal Medicine

## 2016-04-29 ENCOUNTER — Non-Acute Institutional Stay (SKILLED_NURSING_FACILITY): Payer: Medicare Other | Admitting: Internal Medicine

## 2016-04-29 ENCOUNTER — Emergency Department (HOSPITAL_COMMUNITY): Payer: Medicare Other

## 2016-04-29 ENCOUNTER — Encounter (HOSPITAL_COMMUNITY): Payer: Self-pay

## 2016-04-29 DIAGNOSIS — I2782 Chronic pulmonary embolism: Secondary | ICD-10-CM | POA: Diagnosis not present

## 2016-04-29 DIAGNOSIS — I130 Hypertensive heart and chronic kidney disease with heart failure and stage 1 through stage 4 chronic kidney disease, or unspecified chronic kidney disease: Secondary | ICD-10-CM | POA: Insufficient documentation

## 2016-04-29 DIAGNOSIS — L03115 Cellulitis of right lower limb: Secondary | ICD-10-CM | POA: Diagnosis not present

## 2016-04-29 DIAGNOSIS — K4090 Unilateral inguinal hernia, without obstruction or gangrene, not specified as recurrent: Secondary | ICD-10-CM | POA: Diagnosis not present

## 2016-04-29 DIAGNOSIS — Z86711 Personal history of pulmonary embolism: Secondary | ICD-10-CM | POA: Insufficient documentation

## 2016-04-29 DIAGNOSIS — R21 Rash and other nonspecific skin eruption: Secondary | ICD-10-CM | POA: Diagnosis present

## 2016-04-29 DIAGNOSIS — N400 Enlarged prostate without lower urinary tract symptoms: Secondary | ICD-10-CM | POA: Insufficient documentation

## 2016-04-29 DIAGNOSIS — F329 Major depressive disorder, single episode, unspecified: Secondary | ICD-10-CM | POA: Diagnosis not present

## 2016-04-29 DIAGNOSIS — I5042 Chronic combined systolic (congestive) and diastolic (congestive) heart failure: Secondary | ICD-10-CM | POA: Insufficient documentation

## 2016-04-29 DIAGNOSIS — I2510 Atherosclerotic heart disease of native coronary artery without angina pectoris: Secondary | ICD-10-CM | POA: Diagnosis not present

## 2016-04-29 DIAGNOSIS — Z87891 Personal history of nicotine dependence: Secondary | ICD-10-CM | POA: Insufficient documentation

## 2016-04-29 DIAGNOSIS — Z89612 Acquired absence of left leg above knee: Secondary | ICD-10-CM | POA: Insufficient documentation

## 2016-04-29 DIAGNOSIS — Z7982 Long term (current) use of aspirin: Secondary | ICD-10-CM | POA: Insufficient documentation

## 2016-04-29 DIAGNOSIS — F411 Generalized anxiety disorder: Secondary | ICD-10-CM | POA: Diagnosis not present

## 2016-04-29 DIAGNOSIS — L539 Erythematous condition, unspecified: Secondary | ICD-10-CM | POA: Diagnosis not present

## 2016-04-29 DIAGNOSIS — R197 Diarrhea, unspecified: Secondary | ICD-10-CM | POA: Diagnosis not present

## 2016-04-29 DIAGNOSIS — I70209 Unspecified atherosclerosis of native arteries of extremities, unspecified extremity: Secondary | ICD-10-CM

## 2016-04-29 DIAGNOSIS — J449 Chronic obstructive pulmonary disease, unspecified: Secondary | ICD-10-CM | POA: Insufficient documentation

## 2016-04-29 DIAGNOSIS — N2889 Other specified disorders of kidney and ureter: Secondary | ICD-10-CM | POA: Insufficient documentation

## 2016-04-29 DIAGNOSIS — E1140 Type 2 diabetes mellitus with diabetic neuropathy, unspecified: Secondary | ICD-10-CM | POA: Insufficient documentation

## 2016-04-29 DIAGNOSIS — D509 Iron deficiency anemia, unspecified: Secondary | ICD-10-CM | POA: Insufficient documentation

## 2016-04-29 DIAGNOSIS — Z7901 Long term (current) use of anticoagulants: Secondary | ICD-10-CM | POA: Insufficient documentation

## 2016-04-29 DIAGNOSIS — E1151 Type 2 diabetes mellitus with diabetic peripheral angiopathy without gangrene: Secondary | ICD-10-CM | POA: Diagnosis not present

## 2016-04-29 DIAGNOSIS — I252 Old myocardial infarction: Secondary | ICD-10-CM | POA: Insufficient documentation

## 2016-04-29 DIAGNOSIS — R300 Dysuria: Secondary | ICD-10-CM | POA: Insufficient documentation

## 2016-04-29 DIAGNOSIS — E876 Hypokalemia: Secondary | ICD-10-CM | POA: Diagnosis present

## 2016-04-29 DIAGNOSIS — L0390 Cellulitis, unspecified: Secondary | ICD-10-CM | POA: Diagnosis present

## 2016-04-29 DIAGNOSIS — E1122 Type 2 diabetes mellitus with diabetic chronic kidney disease: Secondary | ICD-10-CM | POA: Insufficient documentation

## 2016-04-29 DIAGNOSIS — R6510 Systemic inflammatory response syndrome (SIRS) of non-infectious origin without acute organ dysfunction: Principal | ICD-10-CM | POA: Insufficient documentation

## 2016-04-29 DIAGNOSIS — M549 Dorsalgia, unspecified: Secondary | ICD-10-CM | POA: Diagnosis not present

## 2016-04-29 DIAGNOSIS — I10 Essential (primary) hypertension: Secondary | ICD-10-CM | POA: Diagnosis not present

## 2016-04-29 DIAGNOSIS — E785 Hyperlipidemia, unspecified: Secondary | ICD-10-CM

## 2016-04-29 DIAGNOSIS — Z888 Allergy status to other drugs, medicaments and biological substances status: Secondary | ICD-10-CM | POA: Insufficient documentation

## 2016-04-29 DIAGNOSIS — Z66 Do not resuscitate: Secondary | ICD-10-CM | POA: Insufficient documentation

## 2016-04-29 DIAGNOSIS — N189 Chronic kidney disease, unspecified: Secondary | ICD-10-CM | POA: Insufficient documentation

## 2016-04-29 DIAGNOSIS — Z951 Presence of aortocoronary bypass graft: Secondary | ICD-10-CM | POA: Insufficient documentation

## 2016-04-29 DIAGNOSIS — M5136 Other intervertebral disc degeneration, lumbar region: Secondary | ICD-10-CM | POA: Diagnosis not present

## 2016-04-29 DIAGNOSIS — E559 Vitamin D deficiency, unspecified: Secondary | ICD-10-CM | POA: Diagnosis not present

## 2016-04-29 DIAGNOSIS — F32A Depression, unspecified: Secondary | ICD-10-CM | POA: Diagnosis present

## 2016-04-29 DIAGNOSIS — G4730 Sleep apnea, unspecified: Secondary | ICD-10-CM | POA: Insufficient documentation

## 2016-04-29 DIAGNOSIS — Z881 Allergy status to other antibiotic agents status: Secondary | ICD-10-CM | POA: Insufficient documentation

## 2016-04-29 DIAGNOSIS — I700 Atherosclerosis of aorta: Secondary | ICD-10-CM | POA: Insufficient documentation

## 2016-04-29 DIAGNOSIS — Z9981 Dependence on supplemental oxygen: Secondary | ICD-10-CM | POA: Insufficient documentation

## 2016-04-29 DIAGNOSIS — G8929 Other chronic pain: Secondary | ICD-10-CM | POA: Insufficient documentation

## 2016-04-29 DIAGNOSIS — I739 Peripheral vascular disease, unspecified: Secondary | ICD-10-CM

## 2016-04-29 DIAGNOSIS — I5022 Chronic systolic (congestive) heart failure: Secondary | ICD-10-CM

## 2016-04-29 DIAGNOSIS — K219 Gastro-esophageal reflux disease without esophagitis: Secondary | ICD-10-CM | POA: Diagnosis not present

## 2016-04-29 DIAGNOSIS — Z794 Long term (current) use of insulin: Secondary | ICD-10-CM | POA: Insufficient documentation

## 2016-04-29 DIAGNOSIS — E1169 Type 2 diabetes mellitus with other specified complication: Secondary | ICD-10-CM | POA: Diagnosis present

## 2016-04-29 DIAGNOSIS — L03319 Cellulitis of trunk, unspecified: Secondary | ICD-10-CM

## 2016-04-29 LAB — COMPREHENSIVE METABOLIC PANEL
ALK PHOS: 164 U/L — AB (ref 38–126)
ALT: 64 U/L — ABNORMAL HIGH (ref 17–63)
ANION GAP: 11 (ref 5–15)
AST: 60 U/L — ABNORMAL HIGH (ref 15–41)
Albumin: 2.8 g/dL — ABNORMAL LOW (ref 3.5–5.0)
BILIRUBIN TOTAL: 0.6 mg/dL (ref 0.3–1.2)
BUN: 16 mg/dL (ref 6–20)
CALCIUM: 8.7 mg/dL — AB (ref 8.9–10.3)
CO2: 32 mmol/L (ref 22–32)
Chloride: 92 mmol/L — ABNORMAL LOW (ref 101–111)
Creatinine, Ser: 0.66 mg/dL (ref 0.61–1.24)
Glucose, Bld: 131 mg/dL — ABNORMAL HIGH (ref 65–99)
POTASSIUM: 3.1 mmol/L — AB (ref 3.5–5.1)
Sodium: 135 mmol/L (ref 135–145)
TOTAL PROTEIN: 6.6 g/dL (ref 6.5–8.1)

## 2016-04-29 LAB — I-STAT CG4 LACTIC ACID, ED: LACTIC ACID, VENOUS: 2.05 mmol/L — AB (ref 0.5–1.9)

## 2016-04-29 LAB — CBC WITH DIFFERENTIAL/PLATELET
BASOS PCT: 0 %
Basophils Absolute: 0 10*3/uL (ref 0.0–0.1)
Eosinophils Absolute: 0.2 10*3/uL (ref 0.0–0.7)
Eosinophils Relative: 1 %
HEMATOCRIT: 34.6 % — AB (ref 39.0–52.0)
HEMOGLOBIN: 10.7 g/dL — AB (ref 13.0–17.0)
LYMPHS ABS: 1.7 10*3/uL (ref 0.7–4.0)
LYMPHS PCT: 7 %
MCH: 22.8 pg — AB (ref 26.0–34.0)
MCHC: 30.9 g/dL (ref 30.0–36.0)
MCV: 73.8 fL — ABNORMAL LOW (ref 78.0–100.0)
MONOS PCT: 8 %
Monocytes Absolute: 2 10*3/uL — ABNORMAL HIGH (ref 0.1–1.0)
NEUTROS ABS: 20.5 10*3/uL — AB (ref 1.7–7.7)
Neutrophils Relative %: 84 %
Platelets: 401 10*3/uL — ABNORMAL HIGH (ref 150–400)
RBC: 4.69 MIL/uL (ref 4.22–5.81)
RDW: 16.7 % — AB (ref 11.5–15.5)
WBC: 24.4 10*3/uL — ABNORMAL HIGH (ref 4.0–10.5)

## 2016-04-29 LAB — I-STAT TROPONIN, ED: Troponin i, poc: 0.02 ng/mL (ref 0.00–0.08)

## 2016-04-29 LAB — MRSA PCR SCREENING: MRSA by PCR: NEGATIVE

## 2016-04-29 LAB — PROTIME-INR
INR: 1.36
Prothrombin Time: 16.9 seconds — ABNORMAL HIGH (ref 11.4–15.2)

## 2016-04-29 LAB — AMMONIA: AMMONIA: 38 umol/L — AB (ref 9–35)

## 2016-04-29 LAB — LACTIC ACID, PLASMA
LACTIC ACID, VENOUS: 1.9 mmol/L (ref 0.5–1.9)
LACTIC ACID, VENOUS: 1.7 mmol/L (ref 0.5–1.9)

## 2016-04-29 LAB — GLUCOSE, CAPILLARY: Glucose-Capillary: 173 mg/dL — ABNORMAL HIGH (ref 65–99)

## 2016-04-29 LAB — PROCALCITONIN: Procalcitonin: 0.2 ng/mL

## 2016-04-29 MED ORDER — POLYVINYL ALCOHOL 1.4 % OP SOLN
1.0000 [drp] | Freq: Two times a day (BID) | OPHTHALMIC | Status: DC
  Administered 2016-04-29 – 2016-05-01 (×4): 1 [drp] via OPHTHALMIC
  Filled 2016-04-29: qty 15

## 2016-04-29 MED ORDER — TAMSULOSIN HCL 0.4 MG PO CAPS
0.4000 mg | ORAL_CAPSULE | Freq: Every day | ORAL | Status: DC
  Administered 2016-04-30: 0.4 mg via ORAL
  Filled 2016-04-29: qty 1

## 2016-04-29 MED ORDER — RIVAROXABAN 20 MG PO TABS
20.0000 mg | ORAL_TABLET | Freq: Every day | ORAL | Status: DC
  Administered 2016-04-29 – 2016-04-30 (×2): 20 mg via ORAL
  Filled 2016-04-29 (×2): qty 1

## 2016-04-29 MED ORDER — GABAPENTIN 300 MG PO CAPS
600.0000 mg | ORAL_CAPSULE | Freq: Three times a day (TID) | ORAL | Status: DC
  Administered 2016-04-29 – 2016-05-01 (×5): 600 mg via ORAL
  Filled 2016-04-29 (×5): qty 2

## 2016-04-29 MED ORDER — ONDANSETRON HCL 4 MG PO TABS: 4.0000 mg | ORAL_TABLET | Freq: Four times a day (QID) | ORAL | Status: DC | PRN

## 2016-04-29 MED ORDER — HYDROCODONE-ACETAMINOPHEN 7.5-325 MG PO TABS
1.0000 | ORAL_TABLET | ORAL | Status: DC | PRN
  Administered 2016-04-30 – 2016-05-01 (×2): 1 via ORAL
  Filled 2016-04-29 (×2): qty 1

## 2016-04-29 MED ORDER — LUBIPROSTONE 8 MCG PO CAPS
8.0000 ug | ORAL_CAPSULE | Freq: Two times a day (BID) | ORAL | Status: DC
  Administered 2016-04-29 – 2016-05-01 (×4): 8 ug via ORAL
  Filled 2016-04-29 (×6): qty 1

## 2016-04-29 MED ORDER — VANCOMYCIN HCL 10 G IV SOLR
1250.0000 mg | INTRAVENOUS | Status: AC
  Administered 2016-04-29: 1250 mg via INTRAVENOUS
  Filled 2016-04-29: qty 1250

## 2016-04-29 MED ORDER — TRAZODONE HCL 50 MG PO TABS
100.0000 mg | ORAL_TABLET | Freq: Every day | ORAL | Status: DC
  Administered 2016-04-30: 100 mg via ORAL
  Filled 2016-04-29: qty 2
  Filled 2016-04-29: qty 1

## 2016-04-29 MED ORDER — LIDOCAINE 5 % EX PTCH
1.0000 | MEDICATED_PATCH | CUTANEOUS | Status: DC
  Administered 2016-04-30 – 2016-05-01 (×2): 1 via TRANSDERMAL
  Filled 2016-04-29 (×2): qty 1

## 2016-04-29 MED ORDER — CARVEDILOL 3.125 MG PO TABS
3.1250 mg | ORAL_TABLET | Freq: Two times a day (BID) | ORAL | Status: DC
  Administered 2016-04-30 – 2016-05-01 (×3): 3.125 mg via ORAL
  Filled 2016-04-29 (×3): qty 1

## 2016-04-29 MED ORDER — POTASSIUM CHLORIDE IN NACL 20-0.9 MEQ/L-% IV SOLN: INTRAVENOUS | Status: DC

## 2016-04-29 MED ORDER — VITAMIN D (ERGOCALCIFEROL) 1.25 MG (50000 UNIT) PO CAPS
50000.0000 [IU] | ORAL_CAPSULE | ORAL | Status: DC
  Administered 2016-05-01: 50000 [IU] via ORAL
  Filled 2016-04-29: qty 1

## 2016-04-29 MED ORDER — LORATADINE 10 MG PO TABS
10.0000 mg | ORAL_TABLET | Freq: Every day | ORAL | Status: DC
  Administered 2016-04-30 – 2016-05-01 (×2): 10 mg via ORAL
  Filled 2016-04-29 (×2): qty 1

## 2016-04-29 MED ORDER — SODIUM CHLORIDE 0.9 % IV SOLN
INTRAVENOUS | Status: DC
  Administered 2016-04-29: 20:00:00 via INTRAVENOUS
  Filled 2016-04-29 (×2): qty 1000

## 2016-04-29 MED ORDER — PROPYLENE GLYCOL 0.6 % OP SOLN: 1.0000 [drp] | Freq: Two times a day (BID) | OPHTHALMIC | Status: DC

## 2016-04-29 MED ORDER — INSULIN ASPART 100 UNIT/ML ~~LOC~~ SOLN
0.0000 [IU] | Freq: Every day | SUBCUTANEOUS | Status: DC
Start: 2016-04-29 — End: 2016-05-01

## 2016-04-29 MED ORDER — DOCUSATE SODIUM 100 MG PO CAPS
100.0000 mg | ORAL_CAPSULE | Freq: Two times a day (BID) | ORAL | Status: DC
  Administered 2016-04-29 – 2016-05-01 (×4): 100 mg via ORAL
  Filled 2016-04-29 (×4): qty 1

## 2016-04-29 MED ORDER — IOPAMIDOL (ISOVUE-300) INJECTION 61%
INTRAVENOUS | Status: AC
  Filled 2016-04-29: qty 100

## 2016-04-29 MED ORDER — METHOCARBAMOL 500 MG PO TABS
500.0000 mg | ORAL_TABLET | Freq: Three times a day (TID) | ORAL | Status: DC
  Administered 2016-04-29 – 2016-05-01 (×5): 500 mg via ORAL
  Filled 2016-04-29 (×5): qty 1

## 2016-04-29 MED ORDER — INSULIN ASPART 100 UNIT/ML ~~LOC~~ SOLN
0.0000 [IU] | Freq: Three times a day (TID) | SUBCUTANEOUS | Status: DC
  Administered 2016-04-30: 3 [IU] via SUBCUTANEOUS
  Administered 2016-04-30: 2 [IU] via SUBCUTANEOUS
  Administered 2016-04-30: 5 [IU] via SUBCUTANEOUS
  Administered 2016-05-01: 3 [IU] via SUBCUTANEOUS
  Administered 2016-05-01: 2 [IU] via SUBCUTANEOUS

## 2016-04-29 MED ORDER — PANTOPRAZOLE SODIUM 40 MG PO TBEC
40.0000 mg | DELAYED_RELEASE_TABLET | Freq: Every day | ORAL | Status: DC
  Administered 2016-04-30 – 2016-05-01 (×2): 40 mg via ORAL
  Filled 2016-04-29 (×2): qty 1

## 2016-04-29 MED ORDER — SODIUM CHLORIDE 0.9 % IV SOLN
1250.0000 mg | Freq: Two times a day (BID) | INTRAVENOUS | Status: DC
  Administered 2016-04-30 – 2016-05-01 (×3): 1250 mg via INTRAVENOUS
  Filled 2016-04-29 (×4): qty 1250

## 2016-04-29 MED ORDER — ACETAMINOPHEN 325 MG PO TABS: 650.0000 mg | ORAL_TABLET | Freq: Four times a day (QID) | ORAL | Status: DC | PRN

## 2016-04-29 MED ORDER — DULOXETINE HCL 60 MG PO CPEP
90.0000 mg | ORAL_CAPSULE | Freq: Every day | ORAL | Status: DC
  Administered 2016-04-30 – 2016-05-01 (×2): 90 mg via ORAL
  Filled 2016-04-29: qty 3
  Filled 2016-04-29: qty 1

## 2016-04-29 MED ORDER — POTASSIUM CHLORIDE CRYS ER 20 MEQ PO TBCR
20.0000 meq | EXTENDED_RELEASE_TABLET | Freq: Every day | ORAL | Status: DC
  Administered 2016-04-30 – 2016-05-01 (×2): 20 meq via ORAL
  Filled 2016-04-29 (×3): qty 1

## 2016-04-29 MED ORDER — SODIUM CHLORIDE 0.9 % IJ SOLN
INTRAMUSCULAR | Status: AC
  Filled 2016-04-29: qty 50

## 2016-04-29 MED ORDER — PRO-STAT SUGAR FREE PO LIQD
30.0000 mL | Freq: Two times a day (BID) | ORAL | Status: DC
  Administered 2016-04-29 – 2016-04-30 (×3): 30 mL via ORAL
  Administered 2016-05-01: 08:00:00 via ORAL
  Filled 2016-04-29 (×4): qty 30

## 2016-04-29 MED ORDER — VITAMINS A & D EX OINT: 1.0000 "application " | TOPICAL_OINTMENT | Freq: Two times a day (BID) | CUTANEOUS | Status: DC | PRN

## 2016-04-29 MED ORDER — ONDANSETRON HCL 4 MG/2ML IJ SOLN: 4.0000 mg | Freq: Four times a day (QID) | INTRAMUSCULAR | Status: DC | PRN

## 2016-04-29 MED ORDER — ORAL CARE MOUTH RINSE
15.0000 mL | Freq: Two times a day (BID) | OROMUCOSAL | Status: DC
  Administered 2016-04-30 – 2016-05-01 (×3): 15 mL via OROMUCOSAL

## 2016-04-29 MED ORDER — LORAZEPAM 0.5 MG PO TABS: 0.5000 mg | ORAL_TABLET | Freq: Two times a day (BID) | ORAL | Status: DC | PRN

## 2016-04-29 MED ORDER — SODIUM CHLORIDE 0.9 % IV BOLUS (SEPSIS)
1000.0000 mL | Freq: Once | INTRAVENOUS | Status: AC
  Administered 2016-04-29: 1000 mL via INTRAVENOUS

## 2016-04-29 MED ORDER — ATORVASTATIN CALCIUM 20 MG PO TABS
20.0000 mg | ORAL_TABLET | Freq: Every day | ORAL | Status: DC
  Administered 2016-04-29 – 2016-04-30 (×2): 20 mg via ORAL
  Filled 2016-04-29: qty 1
  Filled 2016-04-29: qty 2
  Filled 2016-04-29: qty 1

## 2016-04-29 MED ORDER — DIPHENHYDRAMINE HCL 50 MG/ML IJ SOLN
25.0000 mg | INTRAMUSCULAR | Status: AC
  Administered 2016-04-29: 25 mg via INTRAVENOUS
  Filled 2016-04-29: qty 1

## 2016-04-29 MED ORDER — DIPHENHYDRAMINE HCL 50 MG/ML IJ SOLN
25.0000 mg | Freq: Two times a day (BID) | INTRAMUSCULAR | Status: DC
  Administered 2016-04-30 – 2016-05-01 (×3): 25 mg via INTRAVENOUS
  Filled 2016-04-29 (×3): qty 1

## 2016-04-29 MED ORDER — IOPAMIDOL (ISOVUE-300) INJECTION 61%
100.0000 mL | Freq: Once | INTRAVENOUS | Status: AC | PRN
  Administered 2016-04-29: 100 mL via INTRAVENOUS

## 2016-04-29 MED ORDER — FERROUS SULFATE 325 (65 FE) MG PO TABS
325.0000 mg | ORAL_TABLET | Freq: Every day | ORAL | Status: DC
  Administered 2016-04-30 – 2016-05-01 (×2): 325 mg via ORAL
  Filled 2016-04-29 (×2): qty 1

## 2016-04-29 MED ORDER — FENTANYL CITRATE (PF) 100 MCG/2ML IJ SOLN
50.0000 ug | Freq: Once | INTRAMUSCULAR | Status: AC
  Administered 2016-04-29: 50 ug via INTRAVENOUS
  Filled 2016-04-29: qty 2

## 2016-04-29 NOTE — ED Notes (Addendum)
Patient A&O x4, non- ambulatory left below knee amputation.  Breathing even and unlabored.  NAD at this time.   Patient advised that wears O2 at facitlity was unsure of how many liters.  Patient on 6L [place] by EMS.  Lowered O2 to 2L, O2 sat 97%.

## 2016-04-29 NOTE — ED Notes (Signed)
Patient transported to CT 

## 2016-04-29 NOTE — ED Notes (Addendum)
Pt has rash to both flank area to the back. Pt denies pain at this time. Rash area has been mark.

## 2016-04-29 NOTE — Clinical Social Work Note (Signed)
Clinical Social Work Assessment  Patient Details  Name: [name] MRN: [id number] Date of Birth: 07/20/1940  Date of referral:  04/29/16               Reason for consult:  Facility Placement                Permission sought to share information with:  Facility Art therapist granted to share information::  Yes, Verbal Permission Granted  Name::        Agency::     Relationship::     Contact Information:     Housing/Transportation Living arrangements for the past 2 months:  [place] of Information:  Spouse Patient Interpreter Needed:  None Criminal Activity/Legal Involvement Pertinent to Current Situation/Hospitalization:    Significant Relationships:  Adult Children, Spouse Lives with:  Facility Resident Do you feel safe going back to the place where you live?  Yes Need for family participation in patient care:  Yes (Comment)  Care giving concerns:  Pt and pt's wife stated they would like some food and drink, CSW will inform pt's RN.   Social Worker assessment / plan:  CSW met with pt and pt's wife confirmed pt's plan to be discharged back to [place] to live at discharge.  Pt has been living at [facility] two years prior to being admitted.    Employment status:  Retired Nurse, adult PT Recommendations:  Not assessed at this time Information / Referral to community resources:     Patient/Family's Response to care:  Patient was not alert and oriented.  Patient's agreeable to plan.  Pt's wife supportive and strongly involved in pt.'s care.  Pt.'s wife pleasant and appreciated CSW intervention.     Patient/Family's Understanding of and Emotional Response to Diagnosis, Current Treatment, and Prognosis:  Still assessing  Emotional Assessment Appearance:  Appears older than stated age Attitude/Demeanor/Rapport:  (S) Unable to Assess Affect (typically observed):  Unable to Assess Orientation:   Fluctuating Orientation (Suspected and/or reported Sundowners) Alcohol / Substance use:    Psych involvement (Current and /or in the community):  No (Comment)  Discharge Needs  Concerns to be addressed:  No discharge needs identified Readmission within the last 30 days:  No Current discharge risk:  None Barriers to Discharge:  Continued Medical Work up   [employer], [place] 04/29/2016, 6:23 PM

## 2016-04-29 NOTE — ED Notes (Signed)
Hospitalist at bedside 

## 2016-04-29 NOTE — ED Notes (Signed)
Patient transported to X-ray 

## 2016-04-29 NOTE — ED Notes (Signed)
ED Provider at bedside. 

## 2016-04-29 NOTE — ED Triage Notes (Addendum)
Patient bib [facility] from [place] with c/o rash at that began at the waist.  Has now spread to the flank area.  Per EMS patient does not have a fever, denies pain, denies N/V.  Per EMS patient O2 Sat en route was 87% on RA applied 6L O2.

## 2016-04-29 NOTE — Progress Notes (Signed)
Pharmacy Antibiotic Note  [name] is a 76 y.o. male admitted on 04/29/2016 with cellulitis.  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  Presents with worsening lower extremity redness from flank area to his back.  Pharmacy has been consulted for Vancomycin dosing.  Patient with noted Vancomycin allergy (2012) with Red Man Syndrome as the reaction.  Patient has received vancomycin since that time period and has been managed with IV Benadryl premed and slow Vancomycin infusion time.  Spoke with Dr Thomasene Lot and received verbal orders to order Benadryl prior to each Vancomycin dose  Plan:  Benadryl 25mg  IV q12h (give as pre-med 30 min prior to each Vancomycin dose)  Vancomycin 1250mg  IV q12h (give each dose over a 3 hr period)  Vancomycin trough goal: 10-15 mcg/ml    Temp (24hrs), Avg:98.1 F (36.7 C), Min:97.7 F (36.5 C), Max:98.4 F (36.9 C)   Recent Labs Lab 04/29/16 1438 04/29/16 1446  WBC 24.4*  --   CREATININE 0.66  --   LATICACIDVEN  --  2.05*    Estimated Creatinine Clearance: 85.3 mL/min (by C-G formula based on SCr of 0.66 mg/dL).    Allergies  Allergen Reactions  . Diazepam Anxiety    REACTION: makes patient cry  . Vancomycin Other (See Comments)    "red man syndrome"    Antimicrobials this admission: 2/6 Vanc >>    Dose adjustments this admission:    Microbiology results:  Thank you for allowing pharmacy to be a part of this patient's care.  Everette Rank, PharmD 04/29/2016 4:12 PM

## 2016-04-29 NOTE — ED Provider Notes (Signed)
[facility] DEPT Provider Note   CSN: [id number] Arrival date & time: 04/29/16  1256     History   Chief Complaint Chief Complaint  Patient presents with  . Rash    HPI DEVELL BRINKERHOFF is a 76 y.o. male.  HPI   76 year old male and nursing home resident 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 Patient had having increasing rash spreading from his flank to his back. He had recent admission for right lower external cellulitis and septic shock. Patient poor historian. No fever noted here.  Level 5 caveat AMS  Past Medical History:  Diagnosis Date  . Adhesive capsulitis   . Angina   . Anxiety   . CAD (coronary artery disease)   . CHF (congestive heart failure) ([place])   . Chronic back pain   . Chronic kidney disease    hx of BPH  . COPD (chronic obstructive pulmonary disease) ([place])   . CVA (cerebral infarction) Questionable history  . Depression   . Diabetes mellitus   . Diverticulosis   . Falls frequently 06/2014  . GERD (gastroesophageal reflux disease)   . Hyperlipidemia   . Hypertension   . 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 Myocardial infarction   . Neuromuscular disorder ([place])    HX of diabetic periferal neuropathy  . On home oxygen therapy    "1.5L prn" (04/12/2014)  . Osteomyelitis ([place]) 2012   Sternoclavicular joint   . PE (pulmonary embolism) 10-15 years ago   Lifelong Coumadin  . Peripheral vascular disease ([place])   . Shortness of breath   . Sleep apnea     Patient Active Problem List   Diagnosis Date Noted  . S/P AKA (above knee amputation) unilateral, left ([place]) 04/29/2016  . 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 Dyslipidemia associated with type 2 diabetes mellitus ([place]) 02/15/2016  . Sepsis affecting skin ([place])   . Cellulitis of right  leg 01/24/2016  . Panlobular emphysema ([place])   . Gangrene ([place])   . Septic shock ([place]) 10/31/2015  . Hypokalemia   . Microcytic anemia 10/19/2015  . Atherosclerosis of native arteries of the extremities with ulceration ([place]) 08/03/2015  . Chronic pulmonary embolism ([place]) 06/18/2015  . Diabetic peripheral neuropathy associated with type 2 diabetes mellitus ([place]) 06/18/2015  . Allergic rhinitis 05/22/2015  . Constipation due to opioid therapy 05/06/2015  . Vitamin D deficiency 05/03/2015  . OA (osteoarthritis) 05/12/2014  . Diabetes type II with atherosclerosis of arteries of extremities ([place]) 05/12/2014  . GERD (gastroesophageal reflux disease) 11/10/2011  . Peripheral vascular disease ([place]) 04/16/2011  . Chronic obstructive pulmonary disease ([place]) 02/27/2011  . BPH (benign prostatic hyperplasia) 11/01/2010  . Long term current use of anticoagulant therapy 05/03/2010  . Bilateral shoulder pain 11/19/2009  . Depression 07/08/2006  . Essential hypertension 07/08/2006  . Anxiety state 05/21/2006  . Coronary atherosclerosis 05/21/2006  . Chronic systolic CHF (congestive heart failure) ([place]) 05/21/2006    Past Surgical History:  Procedure Laterality Date  . AMPUTATION  03/16/2011   Procedure: AMPUTATION DIGIT;  Surgeon: Angelia Mould, MD;  Location: [facility] OR;  Service: Vascular;  Laterality: Left;  Great toe  . AMPUTATION Left 11/08/2015   Procedure: AMPUTATION ABOVE KNEE LEFT;  Surgeon: Waynetta Sandy, MD;  Location: [facility];  Service: Vascular;  Laterality: Left;  . CARDIAC CATHETERIZATION  October 18, 2010   Known obstructive disease, no further blockages  . CARDIAC CATHETERIZATION  03/17/2005   EF 35-40%  . COLONOSCOPY  2006    Multiple polyps removed, repeat in 5 years  . CORONARY ARTERY BYPASS GRAFT  1992  . [place], 2002  . DOPPLER ECHOCARDIOGRAPHY  Sept 2011   EF 50-55% with some impaired diastolic relaxation  . FEMORAL-POPLITEAL BYPASS  GRAFT  2008   Left  . FRACTURE SURGERY  1980s   Hip  . HIP ARTHROPLASTY Left   . PERIPHERAL VASCULAR CATHETERIZATION N/A 11/06/2015   Procedure: Abdominal Aortogram;  Surgeon: Serafina Mitchell, MD;  Location: [facility];  Service: Cardiovascular;  Laterality: N/A;  . PERIPHERAL VASCULAR CATHETERIZATION Bilateral 11/06/2015   Procedure: Lower Extremity Angiography;  Surgeon: Serafina Mitchell, MD;  Location: [facility];  Service: Cardiovascular;  Laterality: Bilateral;  . Right iliopopliteal bypass - [place]  . septic arthritis     Removal of infected CABG wire by Dr Arlyce Dice - 2011  . SPINE SURGERY  2002   Cervical fusion vertebroplasty C3-4-5  . US ECHOCARDIOGRAPHY  03/04/2006   EF 50-55%       Home Medications    Prior to Admission medications   Medication Sig Start Date End Date Taking? Authorizing Provider  aspirin EC 81 MG EC tablet Take 1 tablet (81 mg total) by mouth daily. 11/10/15  Yes Ripudeep Krystal Eaton, MD  atorvastatin (LIPITOR) 20 MG tablet Take 20 mg by mouth at bedtime.    Yes Historical Provider, MD  cetirizine (ZYRTEC ALLERGY) 10 MG tablet Take 10 mg by mouth daily with breakfast.    Yes Historical Provider, MD  DULoxetine (CYMBALTA) 30 MG capsule Take 90 mg by mouth daily with breakfast.    Yes Historical Provider, MD  ferrous sulfate 325 (65 FE) MG tablet Take 1 tablet (325 mg total) by mouth daily with breakfast. 10/29/15  Yes Orson Eva, MD  lidocaine (LIDODERM) 5 % Place 1 patch onto the skin daily. *Apply 0.5 a patch to each shoulder every morning*   Yes Historical Provider, MD  Multiple Vitamins-Minerals (DECUBI-VITE PO) Take 1 capsule by mouth daily with breakfast.    Yes Historical Provider, MD  omeprazole (PRILOSEC) 40 MG capsule Take 1 capsule (40 mg total) by mouth daily. Patient taking differently: Take 40 mg by mouth daily with breakfast.  05/10/14  Yes Alveda Reasons, MD  polyethylene glycol (MIRALAX / GLYCOLAX) packet Take 17 g by mouth daily  with breakfast.    Yes Historical Provider, MD  potassium chloride SA (K-DUR,KLOR-CON) 20 MEQ tablet Take 20 mEq by mouth daily with breakfast.    Yes Historical Provider, MD  rivaroxaban (XARELTO) 20 MG TABS tablet Take 20 mg by mouth daily with supper.   Yes Historical Provider, MD  Skin Protectants, Misc. (CALAZIME SKIN PROTECTANT EX) Apply 1 application topically daily. Apply to sacrum and right buttock every day   Yes Historical Provider, MD  tamsulosin (FLOMAX) 0.4 MG CAPS capsule Take 1 capsule (0.4 mg total) by mouth daily after supper. 10/19/13  Yes Alveda Reasons, MD  traZODone (DESYREL) 100 MG tablet Take 100 mg by mouth at bedtime.    Yes Historical Provider, MD  Vitamin D, Ergocalciferol, (DRISDOL) 50000 units CAPS capsule Take 1 capsule (50,000 Units total) by mouth every 7 (seven) days. Patient taking differently: Take 50,000 Units by mouth  every Thursday.  05/03/15  Yes Gerlene Fee, NP  acetaminophen (TYLENOL) 325 MG tablet Take 650 mg by mouth every 6 (six) hours as needed for fever.    Historical Provider, MD  Amino Acids-Protein Hydrolys (FEEDING SUPPLEMENT, PRO-STAT SUGAR FREE 64,) LIQD Take 30 mLs by mouth 2 (two) times daily. 11/10/15   Ripudeep Krystal Eaton, MD  bisacodyl (DULCOLAX) 10 MG suppository Place 1 suppository (10 mg total) rectally daily as needed for moderate constipation. 11/10/15   Ripudeep Krystal Eaton, MD  carvedilol (COREG) 3.125 MG tablet Take 1 tablet (3.125 mg total) by mouth 2 (two) times daily with a meal. 10/29/15   Orson Eva, MD  collagenase (SANTYL) ointment Apply 1 application topically daily. Apply Santyl to left and right heel wounds Q day, then cover with moist gauze and dry kerlex 11/10/15   Ripudeep K Rai, MD  docusate sodium (COLACE) 100 MG capsule Take 100 mg by mouth 2 (two) times daily.    Historical Provider, MD  furosemide (LASIX) 80 MG tablet Take 1 tablet (80 mg total) by mouth 2 (two) times daily. 10/29/15   Orson Eva, MD  gabapentin (NEURONTIN) 600 MG  tablet Take 600 mg by mouth 3 (three) times daily.     Historical Provider, MD  guaiFENesin-dextromethorphan (ROBITUSSIN DM) 100-10 MG/5ML syrup Take 15 mLs by mouth every 4 (four) hours as needed for cough. 11/10/15   Ripudeep Krystal Eaton, MD  HYDROcodone-acetaminophen (NORCO) 7.5-325 MG tablet Take one tablet by mouth every 4 hours as needed for pain, DNE 3gm of APAP from all sources/24hours. 03/03/16   Lauree Chandler, NP  insulin lispro (HUMALOG) 100 UNIT/ML cartridge Inject into the skin. Inject as per sliding scale: if  0-70=0; 71-149 = 8 units, 150-600= 13 units subcutaneously before meals related to DM.    Historical Provider, MD  insulin NPH Human (HUMULIN N,NOVOLIN N) 100 UNIT/ML injection Inject 0.2 mLs (20 Units total) into the skin 2 (two) times daily before a meal. Patient taking differently: Inject 20 Units into the skin 2 (two) times daily before a meal. 20 units before meals 11/10/15   Ripudeep K Rai, MD  LORazepam (ATIVAN) 0.5 MG tablet Take 1 tablet (0.5 mg total) by mouth 2 (two) times daily as needed for anxiety. 11/10/15   Ripudeep Krystal Eaton, MD  lubiprostone (AMITIZA) 8 MCG capsule Take 8 mcg by mouth 2 (two) times daily with a meal.    Historical Provider, MD  methocarbamol (ROBAXIN) 500 MG tablet Take 1 tablet (500 mg total) by mouth 3 (three) times daily. 11/10/15   Ripudeep Krystal Eaton, MD  nitroGLYCERIN (NITROSTAT) 0.4 MG SL tablet Place 1 tablet (0.4 mg total) under the tongue as needed. For chest pains 10/19/13   Alveda Reasons, MD  OXYGEN Inhale 2 L into the lungs as needed (for congestion, cough, and wheezing related to COPD).    Historical Provider, MD  Propylene Glycol (SYSTANE BALANCE) 0.6 % SOLN Place 1 drop into both eyes 2 (two) times daily.     Historical Provider, MD  senna-docusate (SENOKOT-S) 8.6-50 MG tablet Take 1 tablet by mouth 2 (two) times daily. 11/10/15   Ripudeep Krystal Eaton, MD  triamcinolone cream (KENALOG) 0.1 % Apply 1 application topically 2 (two) times daily.     Historical Provider, MD  Vitamins A & D (VITAMIN A & D) ointment Apply 1 application topically 2 (two) times daily as needed for dry skin.    Historical Provider, MD    Family History  Family History  Problem Relation Age of Onset  . Heart disease Father   . Heart disease Mother     Social History Social History  Substance Use Topics  . Smoking status: Former Smoker    Quit date: 06/19/2000  . Smokeless tobacco: Never Used  . Alcohol use No     Allergies   Diazepam and Vancomycin   Review of Systems Review of Systems  Constitutional: Negative for activity change, fatigue and fever.  Respiratory: Negative for shortness of breath.   Cardiovascular: Negative for chest pain.  Gastrointestinal: Negative for abdominal pain.  Genitourinary: Negative for flank pain.  Skin: Positive for rash.     Physical Exam Updated Vital Signs BP 100/68 (BP Location: Left Arm)   Pulse 99   Temp 98.4 F (36.9 C) (Oral)   Resp 17   SpO2 99%   Physical Exam  Constitutional: He is oriented to person, place, and time. He appears well-nourished.  HENT:  Head: Normocephalic and atraumatic.  Eyes: Conjunctivae are normal.  Cardiovascular: Normal rate and regular rhythm.   Pulmonary/Chest: Effort normal and breath sounds normal. No respiratory distress. He has no wheezes.  Abdominal: Soft. Bowel sounds are normal. He exhibits no distension. There is no tenderness.  Musculoskeletal:  Left BKA  Neurological: He is oriented to person, place, and time.  Skin: Skin is warm and dry. He is not diaphoretic.  Patient has rash with very sharply demarcated borders on the flank  Psychiatric: He has a normal mood and affect. His behavior is normal.     ED Treatments / Results  Labs (all labs ordered are listed, but only abnormal results are displayed) Labs Reviewed - No data to display  EKG  EKG Interpretation None       Radiology No results found.  Procedures Procedures (including  critical care time)  Medications Ordered in ED Medications - No data to display   Initial Impression / Assessment and Plan / ED Course  I have reviewed the triage vital signs and the nursing notes.  Pertinent labs & imaging results that were available during my care of the patient were reviewed by me and considered in my medical decision making (see chart for details).     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 Very well demarcated rash. Concerning for erysipelas versus cellulitis. We'll get labs, treat with antibiotics. Given the acute nature and rapid spread would consider inpatient with IV antibiotics.           Final Clinical Impressions(s) / ED Diagnoses   Final diagnoses:  None    New Prescriptions New Prescriptions   No medications on file     Alzena Gerber Julio Alm, MD 05/01/16 0025

## 2016-04-29 NOTE — ED Notes (Signed)
Patient given ginger ale. 

## 2016-04-29 NOTE — H&P (Signed)
[facility] History and Physical  [name] [id number] DOB: 21-Feb-1941 DOA: 04/29/2016  PCP: Gildardo Cranker, DO  Patient coming from: SNF, [facility]  Chief Complaint: Rash  HPI: [name] is a 76 y.o. 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 This rash has been there for a few days. He cannot recall when it truly started. He does not recall taking any medications for it. Patient denies any itchiness to the rash. Currently he denies any chest pain, shortness of breath, abdominal pain, nausea or vomiting. He does endorse diarrhea, he states twice today. Denies any sick or ill contacts, recent travel. Denies any recent fever or cough. Patient does complain of chronic back pain.  ED Course: Found to have a white count of 24 with possible cellulitis bilaterally. Patient also has soft blood pressure given 1 L of normal saline. TRH called for admission.  Review of Systems:  All other systems reviewed and are negative.   Past Medical History:  Diagnosis Date  . Adhesive capsulitis   . Angina   . Anxiety   . CAD (coronary artery disease)   . CHF (congestive heart failure) ([place])   . Chronic back pain   . Chronic kidney disease    hx of BPH  . COPD (chronic obstructive pulmonary disease) ([place])   . CVA (cerebral infarction) Questionable history  . Depression   . Diabetes mellitus   . Diverticulosis   . Falls frequently 06/2014  . GERD (gastroesophageal reflux disease)   . Hyperlipidemia   . Hypertension   . 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 Myocardial infarction   . Neuromuscular disorder ([place])    HX of diabetic periferal neuropathy  . On home oxygen therapy     "1.5L prn" (04/12/2014)  . Osteomyelitis ([place]) 2012   Sternoclavicular joint   . PE (pulmonary embolism) 10-15 years ago   Lifelong Coumadin  . Peripheral vascular disease ([place])   . Shortness of breath   . Sleep apnea     Past Surgical History:  Procedure Laterality Date  . AMPUTATION  03/16/2011   Procedure: AMPUTATION DIGIT;  Surgeon: Angelia Mould, MD;  Location: [facility];  Service: Vascular;  Laterality: Left;  Great toe  . AMPUTATION Left 11/08/2015   Procedure: AMPUTATION ABOVE KNEE LEFT;  Surgeon: Waynetta Sandy, MD;  Location: [place];  Service: Vascular;  Laterality: Left;  . CARDIAC CATHETERIZATION  October 18, 2010   Known obstructive disease, no further blockages  . CARDIAC CATHETERIZATION  03/17/2005   EF 35-40%  . COLONOSCOPY  2006    Multiple polyps removed, repeat in 5 years  . CORONARY ARTERY BYPASS GRAFT  1992  . [place], 2002  . DOPPLER ECHOCARDIOGRAPHY  Sept 2011   EF 50-55% with some impaired diastolic relaxation  . FEMORAL-POPLITEAL BYPASS GRAFT  2008   Left  . FRACTURE SURGERY  1980s   Hip  . HIP ARTHROPLASTY Left   . PERIPHERAL VASCULAR CATHETERIZATION N/A 11/06/2015   Procedure: Abdominal Aortogram;  Surgeon: Serafina Mitchell, MD;  Location: [place] [facility];  Service: Cardiovascular;  Laterality: N/A;  . PERIPHERAL VASCULAR CATHETERIZATION Bilateral 11/06/2015   Procedure: Lower Extremity  Angiography;  Surgeon: Serafina Mitchell, MD;  Location: [place] [facility];  Service: Cardiovascular;  Laterality: Bilateral;  . Right iliopopliteal bypass - [place]  . septic arthritis     Removal of infected CABG wire by Dr Arlyce Dice - 2011  . SPINE SURGERY  2002   Cervical fusion vertebroplasty C3-4-5  . US ECHOCARDIOGRAPHY  03/04/2006   EF 50-55%    Social History:  reports that he quit smoking about 15 years ago. He has never used smokeless tobacco. He reports that he does not drink alcohol or use drugs.  Allergies    Allergen Reactions  . Diazepam Anxiety    REACTION: makes patient cry  . Vancomycin Other (See Comments)    "red man syndrome"    Family History  Problem Relation Age of Onset  . Heart disease Father   . Heart disease Mother      Prior to Admission medications   Medication Sig Start Date End Date Taking? Authorizing Provider  acetaminophen (TYLENOL) 325 MG tablet Take 650 mg by mouth every 6 (six) hours as needed for fever.   Yes Historical Provider, MD  Amino Acids-Protein Hydrolys (FEEDING SUPPLEMENT, PRO-STAT SUGAR FREE 64,) LIQD Take 30 mLs by mouth 2 (two) times daily. 11/10/15  Yes Ripudeep Krystal Eaton, MD  aspirin EC 81 MG EC tablet Take 1 tablet (81 mg total) by mouth daily. 11/10/15  Yes Ripudeep Krystal Eaton, MD  atorvastatin (LIPITOR) 20 MG tablet Take 20 mg by mouth at bedtime.    Yes Historical Provider, MD  bisacodyl (DULCOLAX) 10 MG suppository Place 1 suppository (10 mg total) rectally daily as needed for moderate constipation. 11/10/15  Yes Ripudeep Krystal Eaton, MD  carvedilol (COREG) 3.125 MG tablet Take 1 tablet (3.125 mg total) by mouth 2 (two) times daily with a meal. 10/29/15  Yes Orson Eva, MD  cetirizine (ZYRTEC ALLERGY) 10 MG tablet Take 10 mg by mouth daily with breakfast.    Yes Historical Provider, MD  docusate sodium (COLACE) 100 MG capsule Take 100 mg by mouth 2 (two) times daily.   Yes Historical Provider, MD  DULoxetine (CYMBALTA) 30 MG capsule Take 90 mg by mouth daily with breakfast.    Yes Historical Provider, MD  ferrous sulfate 325 (65 FE) MG tablet Take 1 tablet (325 mg total) by mouth daily with breakfast. 10/29/15  Yes Orson Eva, MD  furosemide (LASIX) 80 MG tablet Take 1 tablet (80 mg total) by mouth 2 (two) times daily. 10/29/15  Yes Orson Eva, MD  gabapentin (NEURONTIN) 600 MG tablet Take 600 mg by mouth 3 (three) times daily.    Yes Historical Provider, MD  guaiFENesin-dextromethorphan (ROBITUSSIN DM) 100-10 MG/5ML syrup Take 15 mLs by mouth every 4 (four) hours as needed  for cough. 11/10/15  Yes Ripudeep Krystal Eaton, MD  HYDROcodone-acetaminophen (NORCO) 7.5-325 MG tablet Take one tablet by mouth every 4 hours as needed for pain, DNE 3gm of APAP from all sources/24hours. 03/03/16  Yes Lauree Chandler, NP  insulin lispro (HUMALOG) 100 UNIT/ML cartridge Inject 0-13 Units into the skin 3 (three) times daily with meals. Inject as per sliding scale: if  0-70=0 units; 71-149 = 8 units, 150-600= 13 units subcutaneously before meals related to DM.   Yes Historical Provider, MD  insulin NPH Human (HUMULIN N,NOVOLIN N) 100 UNIT/ML injection Inject 0.2 mLs (20 Units total) into the skin 2 (two) times daily before a meal. 11/10/15  Yes Ripudeep K Rai, MD  lidocaine (LIDODERM) 5 %  Place 1 patch onto the skin daily. *Apply 0.5 a patch to each shoulder every morning*   Yes Historical Provider, MD  LORazepam (ATIVAN) 0.5 MG tablet Take 1 tablet (0.5 mg total) by mouth 2 (two) times daily as needed for anxiety. 11/10/15  Yes Ripudeep Krystal Eaton, MD  lubiprostone (AMITIZA) 8 MCG capsule Take 8 mcg by mouth 2 (two) times daily with a meal.   Yes Historical Provider, MD  methocarbamol (ROBAXIN) 500 MG tablet Take 1 tablet (500 mg total) by mouth 3 (three) times daily. 11/10/15  Yes Ripudeep Krystal Eaton, MD  Multiple Vitamins-Minerals (DECUBI-VITE PO) Take 1 capsule by mouth daily with breakfast.    Yes Historical Provider, MD  nitroGLYCERIN (NITROSTAT) 0.4 MG SL tablet Place 1 tablet (0.4 mg total) under the tongue as needed. For chest pains Patient taking differently: Place 0.4 mg under the tongue every 5 (five) minutes as needed for chest pain.  10/19/13  Yes Alveda Reasons, MD  omeprazole (PRILOSEC) 40 MG capsule Take 1 capsule (40 mg total) by mouth daily. Patient taking differently: Take 40 mg by mouth daily with breakfast.  05/10/14  Yes Alveda Reasons, MD  OXYGEN Inhale 2 L into the lungs continuous.    Yes Historical Provider, MD  polyethylene glycol (MIRALAX / GLYCOLAX) packet Take 17 g by mouth  daily with breakfast.    Yes Historical Provider, MD  potassium chloride SA (K-DUR,KLOR-CON) 20 MEQ tablet Take 20 mEq by mouth daily with breakfast.    Yes Historical Provider, MD  Propylene Glycol (SYSTANE BALANCE) 0.6 % SOLN Place 1 drop into both eyes 2 (two) times daily.    Yes Historical Provider, MD  rivaroxaban (XARELTO) 20 MG TABS tablet Take 20 mg by mouth daily with supper.   Yes Historical Provider, MD  senna-docusate (SENOKOT-S) 8.6-50 MG tablet Take 1 tablet by mouth 2 (two) times daily. 11/10/15  Yes Ripudeep Krystal Eaton, MD  Skin Protectants, Misc. (CALAZIME SKIN PROTECTANT EX) Apply 1 application topically daily. Apply to sacrum and right buttock every day   Yes Historical Provider, MD  tamsulosin (FLOMAX) 0.4 MG CAPS capsule Take 1 capsule (0.4 mg total) by mouth daily after supper. 10/19/13  Yes Alveda Reasons, MD  traZODone (DESYREL) 100 MG tablet Take 100 mg by mouth at bedtime.    Yes Historical Provider, MD  Vitamin D, Ergocalciferol, (DRISDOL) 50000 units CAPS capsule Take 1 capsule (50,000 Units total) by mouth every 7 (seven) days. Patient taking differently: Take 50,000 Units by mouth every Thursday.  05/03/15  Yes Gerlene Fee, NP  Vitamins A & D (VITAMIN A & D) ointment Apply 1 application topically 2 (two) times daily as needed for dry skin.   Yes Historical Provider, MD    Physical Exam: Vitals:   04/29/16 1312 04/29/16 1527  BP: 100/68 98/58  Pulse: 99 91  Resp: 17 20  Temp: 98.4 F (36.9 C)      General: Well developed, well nourished, Chronically ill-appearing  HEENT: NCAT, PERRLA, EOMI, Anicteic Sclera, mucous membranes moist.   Neck: Supple, no JVD, no masses  Cardiovascular: S1 S2 auscultated, Soft murmur, Regular rate and rhythm.  Respiratory: Difficult to auscultate anteriorly, mildly diminished  Abdomen: Soft, obese, nontender, nondistended, + bowel sounds  Extremities: Left AKA.  RLE trace edema with venous stasis skin changes  Neuro: AAOx2,  Poor historian. strength not tested as patient is not cooperative and complains of pain  Skin: Erythema on the right lateral thigh as well  as the flank. mild erythema noted on the left lateral thigh and flank. Warm to touch  Psych:Angry due to pain.   Labs on Admission: I have personally reviewed following labs and imaging studies CBC:  Recent Labs Lab 04/29/16 1438  WBC 24.4*  NEUTROABS 20.5*  HGB 10.7*  HCT 34.6*  MCV 73.8*  PLT 123XX123*   Basic Metabolic Panel:  Recent Labs Lab 04/29/16 1438  NA 135  K 3.1*  CL 92*  CO2 32  GLUCOSE 131*  BUN 16  CREATININE 0.66  CALCIUM 8.7*   GFR: Estimated Creatinine Clearance: 85.3 mL/min (by C-G formula based on SCr of 0.66 mg/dL). Liver Function Tests:  Recent Labs Lab 04/29/16 1438  AST 60*  ALT 64*  ALKPHOS 164*  BILITOT 0.6  PROT 6.6  ALBUMIN 2.8*   No results for input(s): LIPASE, AMYLASE in the last 168 hours.  Recent Labs Lab 04/29/16 1438  AMMONIA 38*   Coagulation Profile:  Recent Labs Lab 04/29/16 1438  INR 1.36   Cardiac Enzymes: No results for input(s): CKTOTAL, CKMB, CKMBINDEX, TROPONINI in the last 168 hours. BNP (last 3 results) No results for input(s): PROBNP in the last 8760 hours. HbA1C: No results for input(s): HGBA1C in the last 72 hours. CBG: No results for input(s): GLUCAP in the last 168 hours. Lipid Profile: No results for input(s): CHOL, HDL, LDLCALC, TRIG, CHOLHDL, LDLDIRECT in the last 72 hours. Thyroid Function Tests: No results for input(s): TSH, T4TOTAL, FREET4, T3FREE, THYROIDAB in the last 72 hours. Anemia Panel: No results for input(s): VITAMINB12, FOLATE, FERRITIN, TIBC, IRON, RETICCTPCT in the last 72 hours. Urine analysis:    Component Value Date/Time   COLORURINE YELLOW 01/24/2016 [place] 01/24/2016 1725   LABSPEC 1.015 01/24/2016 1725   PHURINE 6.0 01/24/2016 1725   GLUCOSEU NEGATIVE 01/24/2016 1725   HGBUR NEGATIVE 01/24/2016 1725   HGBUR  negative 03/06/2010 1419   BILIRUBINUR NEGATIVE 01/24/2016 [place] 01/24/2016 1725   PROTEINUR NEGATIVE 01/24/2016 1725   UROBILINOGEN 0.2 09/13/2014 0945   NITRITE NEGATIVE 01/24/2016 1725   LEUKOCYTESUR NEGATIVE 01/24/2016 1725   Sepsis Labs: @LABRCNTIP (procalcitonin:4,lacticidven:4) )No results found for this or any previous visit (from the past 240 hour(s)).   Radiological Exams on Admission: Dg Chest 2 View  Result Date: 04/29/2016 CLINICAL DATA:  Hypertension. History of chronic pulmonary embolus. Diffuse rash. EXAM: CHEST  2 VIEW COMPARISON:  January 24, 2016 FINDINGS: No edema or consolidation evident. Heart is borderline enlarged with pulmonary vascularity within normal limits. No evident adenopathy. Patient is status post coronary artery bypass grafting. There is degenerative change in the thoracic spine. There is postoperative change in the lower cervical spine. There is arthropathy in the right shoulder. IMPRESSION: No edema or consolidation.  Heart borderline enlarged. Electronically Signed   By: Lowella Grip III M.D.   On: 04/29/2016 15:32    EKG: None  Assessment/Plan  SIRS -Patient presented with a white count of 24.4, tachycardia of 99 with blood pressure of 90/58, mild lactic acid of 2.05 -Source of infection possibly cellulitis -Spoke with Dr. Megan Salon, infectious disease, advised to hold antibiotics at this time -Have asked the emergency department for CT abdomen and pelvis and procalcitonin level -If pro-calcitonin level elevated, ulcer patient on broad-spectrum antibiotics -Continue IV fluid  Erythema, questionable cellulitis -Unlikely to be cellulitis as it is bilateral on the flank as well as upper lateral thighs -Treatment plan as above -Areas have been marked -Care order for skin  to be monitored every 1 hour  Chronic combined systolic and diastolic heart failure -Echocardiogram 10/22/2015 showed an EF of 99991111, grade 2 diastolic  dysfunction -Monitor intake and output, daily weights -Will place on gentle IVF given his hypotension -Lasix held  Hypokalemia -Replacing the monitor BMP  Essential hypertension -BP soft, will hold home medications of Coreg, Lasix  Chronic microcytic anemia -Continue iron supplementation -Monitor CBC  Diabetes mellitus, type II with neuropathy -Home insulin regimen held, place on insulin sliding scale CBG monitoring -Continue gabapentin  Constipation likely secondary to narcotics -Continue Amitiza, bowel regimen  GERD -Continue PPI  Depression/anxiety -Continue Cymbalta, Ativan as needed  History of PE -On Xarelto  Chronic back pain -Continue home regimen  BPH -Holding Flomax due to soft blood pressure  DVT prophylaxis: Xarelto  Code Status: DNR (per documentation from SNF)  Family Communication: None at bedside. Admission, patients condition and plan of care including tests being ordered have been discussed with the patient, who indicates understanding and agrees with the plan and Code Status.  Disposition Plan: SNF when medically improved   Consults called: Infectious disease, Dr. Megan Salon, via phone only   Admission status: Observation   Time spent: 70 minutes  Zakary Kimura D.O. [facility] Pager [phone]  If 7PM-7AM, please contact night-coverage [email or web address] Password [email or web address] 04/29/2016, 4:55 PM

## 2016-04-29 NOTE — Progress Notes (Signed)
Patient ID: [name], male   DOB: 04-29-1940, 76 y.o.   MRN: [id number]    DATE:  04/29/2016  Location:    [facility] Room Number: 159 B Place of Service: SNF (31)   Extended Emergency Contact Information Primary Emergency Contact: Lianne Moris Address: [address], [place] Mobile Phone: [phone] Relation: Spouse Secondary Emergency Contact: Stedron,Cathy  [place] Mobile Phone: [phone] Relation: Daughter Preferred language: English  Advanced Directive information Does Patient Have a Medical Advance Directive?: Yes, Type of Advance Directive: Out of facility DNR (pink MOST or yellow form), Pre-existing out of facility DNR order (yellow form or pink MOST form): Yellow form placed in chart (order not valid for inpatient use)  Chief Complaint  Patient presents with  . Acute Visit    lower extremity redness    HPI:  76 yo male long term resident seen today for worsening b/l  LE redness x few days. Nursing reports redness markedly different today when compared to yesterday. Wound vac removed on Feb 1st from left AKA site. Pt denies fever but has had low grade temps per pt.  He reports no pain but has discomfort in thighs/abdomen when pressure applied. No d/c or vesicular formation. No N/V. He is O2 dependent and wears [place] O2 ATC. He is a poor historian due to lethargy. Hx obtained from chart  Chronic systolic heart failure - EF 30-35% (10-22-15). Takes lasix 80 mg twice daily with k+ 20 meq  Daily;  coreg 3.125 mg twice daily   Hypertension - stable on coreg 3.125 mg twice daily   CAD s/p CABG and stents - stable on ASA 81 mg daily and ntg prn  Chronic PE - stable on xarelto 20 mg daily  Hyperlipidemia - stable on liptor 20 mg daily. LDL 87  DM - stable. A1c 5.7%. Takes NPH 20 units twice daily and humalog 8 units with meals with an additional 3 units for cbg >150. 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 stable on ASA 81 mg; pain stable on vicodin 7.5/325 mg every 4 hours as needed   Hx Anemia - stable on iron daily. Hgb 10.4  COPD/chronic respiratory failure with hypoxia - stable on chronic O2 therapy. No recent exacerbations  Constipation - stable on colace twice daily; senna s twice daily; miralax daily; amitiza 8 mcg twice daily   Chronic pain syndrome/Peripheral neuropathy - stable on cymbalta 90 mg daily; neurontin 600 mg three times daily; robaxin 500 mg three times daily for spasms  Anxiety with depression - mood stable on cymbalta 90 mg daily; trazodone 100 mg nightly;  ativan 0.5 mg twice daily as needed  GERD - stable on prilosec 40 mg daily  Bilateral shoulder pain - stable on lidoderm patch; vicodin 7.5/325 mg every 4 hours as needed   Vit D deficiency - stable on vitamin d 50,000 units weekly   BPH - stable on flomax 0.4 mg daily   Past Medical History:  Diagnosis Date  . Adhesive capsulitis   . Angina   . Anxiety   . CAD (coronary artery disease)   . CHF (congestive heart failure) ([place])   . Chronic back pain   . Chronic kidney disease    hx of BPH  . COPD (chronic obstructive pulmonary disease) ([place])   .  CVA (cerebral infarction) Questionable history  . Depression   . Diabetes mellitus   . Diverticulosis   . Falls frequently 06/2014  . GERD (gastroesophageal reflux disease)   . Hyperlipidemia   . Hypertension   . 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 Myocardial infarction   . Neuromuscular disorder ([place])    HX of diabetic periferal neuropathy  . On home oxygen therapy    "1.5L prn" (04/12/2014)  . Osteomyelitis ([place]) 2012   Sternoclavicular joint   . PE (pulmonary embolism) 10-15 years ago   Lifelong Coumadin  . Peripheral vascular disease ([place])   . Shortness of breath   . Sleep apnea      Past Surgical History:  Procedure Laterality Date  . AMPUTATION  03/16/2011   Procedure: AMPUTATION DIGIT;  Surgeon: Angelia Mould, MD;  Location: [facility] OR;  Service: Vascular;  Laterality: Left;  Great toe  . AMPUTATION Left 11/08/2015   Procedure: AMPUTATION ABOVE KNEE LEFT;  Surgeon: Waynetta Sandy, MD;  Location: [facility];  Service: Vascular;  Laterality: Left;  . CARDIAC CATHETERIZATION  October 18, 2010   Known obstructive disease, no further blockages  . CARDIAC CATHETERIZATION  03/17/2005   EF 35-40%  . COLONOSCOPY  2006    Multiple polyps removed, repeat in 5 years  . CORONARY ARTERY BYPASS GRAFT  1992  . [place], 2002  . DOPPLER ECHOCARDIOGRAPHY  Sept 2011   EF 50-55% with some impaired diastolic relaxation  . FEMORAL-POPLITEAL BYPASS GRAFT  2008   Left  . FRACTURE SURGERY  1980s   Hip  . HIP ARTHROPLASTY Left   . PERIPHERAL VASCULAR CATHETERIZATION N/A 11/06/2015   Procedure: Abdominal Aortogram;  Surgeon: Serafina Mitchell, MD;  Location: [facility];  Service: Cardiovascular;  Laterality: N/A;  . PERIPHERAL VASCULAR CATHETERIZATION Bilateral 11/06/2015   Procedure: Lower Extremity Angiography;  Surgeon: Serafina Mitchell, MD;  Location: [facility];  Service: Cardiovascular;  Laterality: Bilateral;  . Right iliopopliteal bypass - [place]  . septic arthritis     Removal of infected CABG wire by Dr Arlyce Dice - 2011  . SPINE SURGERY  2002   Cervical fusion vertebroplasty C3-4-5  . US ECHOCARDIOGRAPHY  03/04/2006   EF 50-55%    Patient Care Team: Gildardo Cranker, DO as PCP - General (Internal Medicine) Darlin Coco, MD (Cardiology) Clent Jacks, MD (Ophthalmology) Christin Fudge, MD as Consulting Physician (Surgery)  Social History   Social History  . Marital status: Married    Spouse name: Butch Penny  . Number of children: 3  . Years of education: 12   Occupational History  . Retired-machinest/welder Retired   Social  History Main Topics  . Smoking status: Former Smoker    Quit date: 06/19/2000  . Smokeless tobacco: Never Used  . Alcohol use No  . Drug use: No  . Sexual activity: No   Other Topics Concern  . Not on file   Social History Narrative   Health Care POA: TBD   Emergency Contact: wife, Butch Penny [phone]   End of Life Plan: pt does not have AD and not interested.   Who lives with you: Butch Penny and step-daughter-Betty   Any pets: none   Diet: Pt has a varied diet of protein, starch and vegetables.   Exercise: Pt does not have regular exercise routine.   Seatbelts: Pt reports wearing seatbelt when in vehicles.  Hobbies: listening to car races.               reports that he quit smoking about 15 years ago. He has never used smokeless tobacco. He reports that he does not drink alcohol or use drugs.  Family History  Problem Relation Age of Onset  . Heart disease Father   . Heart disease Mother    Family Status  Relation Status  . Father Deceased  . Mother Deceased    Immunization History  Administered Date(s) Administered  . Influenza Split 12/12/2010  . Influenza Whole 12/22/2005, 12/14/2007  . Influenza,inj,Quad PF,36+ Mos 01/20/2013, 12/09/2013  . Influenza-Unspecified 12/15/2014, 01/15/2016  . Pneumococcal Conjugate-13 10/10/2013  . Pneumococcal Polysaccharide-23 11/23/1999, 03/25/2003, 05/25/2011  . Td 10/23/2003  . Tdap 07/17/2014    Allergies  Allergen Reactions  . Diazepam Anxiety    REACTION: makes patient cry  . Vancomycin Other (See Comments)    "red man syndrome"    Medications: Patient's Medications  New Prescriptions   No medications on file  Previous Medications   ACETAMINOPHEN (TYLENOL) 325 MG TABLET    Take 650 mg by mouth every 6 (six) hours as needed for fever.   AMINO ACIDS-PROTEIN HYDROLYS (FEEDING SUPPLEMENT, PRO-STAT SUGAR FREE 64,) LIQD    Take 30 mLs by mouth 2 (two) times daily.   ASPIRIN EC 81 MG EC TABLET    Take 1 tablet (81 mg total) by  mouth daily.   ATORVASTATIN (LIPITOR) 20 MG TABLET    Take 20 mg by mouth daily.   BISACODYL (DULCOLAX) 10 MG SUPPOSITORY    Place 1 suppository (10 mg total) rectally daily as needed for moderate constipation.   CARVEDILOL (COREG) 3.125 MG TABLET    Take 1 tablet (3.125 mg total) by mouth 2 (two) times daily with a meal.   CETIRIZINE (ZYRTEC ALLERGY) 10 MG TABLET    Take 10 mg by mouth daily.   COLLAGENASE (SANTYL) OINTMENT    Apply 1 application topically daily. Apply Santyl to left and right heel wounds Q day, then cover with moist gauze and dry kerlex   DOCUSATE SODIUM (COLACE) 100 MG CAPSULE    Take 100 mg by mouth 2 (two) times daily.   DULOXETINE (CYMBALTA) 30 MG CAPSULE    Take 90 mg by mouth daily.   FERROUS SULFATE 325 (65 FE) MG TABLET    Take 1 tablet (325 mg total) by mouth daily with breakfast.   FUROSEMIDE (LASIX) 80 MG TABLET    Take 1 tablet (80 mg total) by mouth 2 (two) times daily.   GABAPENTIN (NEURONTIN) 600 MG TABLET    Take 600 mg by mouth 3 (three) times daily.    GUAIFENESIN-DEXTROMETHORPHAN (ROBITUSSIN DM) 100-10 MG/5ML SYRUP    Take 15 mLs by mouth every 4 (four) hours as needed for cough.   HYDROCODONE-ACETAMINOPHEN (NORCO) 7.5-325 MG TABLET    Take one tablet by mouth every 4 hours as needed for pain, DNE 3gm of APAP from all sources/24hours.   INSULIN LISPRO (HUMALOG) 100 UNIT/ML CARTRIDGE    Inject into the skin. Inject as per sliding scale: if  0-70=0; 71-149 = 8 units, 150-600= 13 units subcutaneously before meals related to DM.   INSULIN NPH HUMAN (HUMULIN N,NOVOLIN N) 100 UNIT/ML INJECTION    Inject 0.2 mLs (20 Units total) into the skin 2 (two) times daily before a meal.   LIDOCAINE (LIDODERM) 5 %    Place 1 patch onto the skin daily.  LORAZEPAM (ATIVAN) 0.5 MG TABLET    Take 1 tablet (0.5 mg total) by mouth 2 (two) times daily as needed for anxiety.   LUBIPROSTONE (AMITIZA) 8 MCG CAPSULE    Take 8 mcg by mouth 2 (two) times daily with a meal.   METHOCARBAMOL  (ROBAXIN) 500 MG TABLET    Take 1 tablet (500 mg total) by mouth 3 (three) times daily.   MULTIPLE VITAMINS-MINERALS (DECUBI-VITE PO)    Take 1 capsule by mouth daily.   NITROGLYCERIN (NITROSTAT) 0.4 MG SL TABLET    Place 1 tablet (0.4 mg total) under the tongue as needed. For chest pains   OMEPRAZOLE (PRILOSEC) 40 MG CAPSULE    Take 1 capsule (40 mg total) by mouth daily.   OXYGEN    Inhale 2 L into the lungs as needed (for congestion, cough, and wheezing related to COPD).   POLYETHYLENE GLYCOL (MIRALAX / GLYCOLAX) PACKET    Take 17 g by mouth daily.   POTASSIUM CHLORIDE SA (K-DUR,KLOR-CON) 20 MEQ TABLET    Take 20 mEq by mouth daily.    PROPYLENE GLYCOL (SYSTANE BALANCE) 0.6 % SOLN    Place 1 drop into both eyes 2 (two) times daily.    RIVAROXABAN (XARELTO) 20 MG TABS TABLET    Take 20 mg by mouth daily with supper.   SENNA-DOCUSATE (SENOKOT-S) 8.6-50 MG TABLET    Take 1 tablet by mouth 2 (two) times daily.   SKIN PROTECTANTS, MISC. (CALAZIME SKIN PROTECTANT EX)    Apply topically. Apply to sacrum and right buttock every day shift   TAMSULOSIN (FLOMAX) 0.4 MG CAPS CAPSULE    Take 1 capsule (0.4 mg total) by mouth daily after supper.   TRAZODONE (DESYREL) 100 MG TABLET    Take 100 mg by mouth at bedtime.    TRIAMCINOLONE CREAM (KENALOG) 0.1 %    Apply 1 application topically 2 (two) times daily.   VITAMIN D, ERGOCALCIFEROL, (DRISDOL) 50000 UNITS CAPS CAPSULE    Take 1 capsule (50,000 Units total) by mouth every 7 (seven) days.   VITAMINS A & D (VITAMIN A & D) OINTMENT    Apply 1 application topically 2 (two) times daily as needed for dry skin.  Modified Medications   No medications on file  Discontinued Medications   APIXABAN (ELIQUIS) 5 MG TABS TABLET    Take 5 mg by mouth 2 (two) times daily.   MULTIPLE VITAMIN (MULTIVITAMIN WITH MINERALS) TABS TABLET    Take 1 tablet by mouth daily.    Review of Systems  Unable to perform ROS: Other (lethargy)    Vitals:   04/29/16 1117  BP: (!)  110/47 --> Repeat 110/53  Pulse: 67 --> repeat 94  Resp: 18  Temp: 97.7 F (36.5 C) -->  Repeat 98.56F  TempSrc: Oral  SpO2: 96% -->repeat 94% on [place] O2  Weight: 218 lb 9.6 oz (99.2 kg)  Height: 5\' 6"  (1.676 m)   Body mass index is 35.28 kg/m.  Physical Exam  Constitutional: He appears well-developed. He appears lethargic.  Lying in bed in NAD, frail appearing, [place] O2 intact. Looks pale; HOH  HENT:  Mouth/Throat: Oropharynx is clear and moist. No oropharyngeal exudate.  MMM; no oral thrush  Eyes: Pupils are equal, round, and reactive to light. No scleral icterus.  Neck: Neck supple. Carotid bruit is not present. No thyromegaly present.  Cardiovascular: Normal rate, regular rhythm and intact distal pulses.  Exam reveals no gallop and no friction rub.   Murmur (  1/6 SEM) heard. Left AKA; no calf TTP; RLE chronic venous stasis changes with trace LE edema.  Pulmonary/Chest: Effort normal. He has no wheezes. He has no rales. He exhibits no tenderness.  Reduced BS at base b/l  Abdominal: Soft. Bowel sounds are normal. He exhibits no distension, no fluid wave, no abdominal bruit, no pulsatile midline mass and no mass. There is no hepatomegaly. There is tenderness (b/l flank). There is no rebound and no guarding.    Musculoskeletal: He exhibits edema, tenderness and deformity (left AKA no dehiscence).  Lymphadenopathy:    He has cervical adenopathy.  Neurological: He appears lethargic.  Skin: Skin is warm, dry and intact. Rash (blancheable with no vesicular formation, warm to touch and pitting edema; TTP) noted. There is erythema (blancheable).     Psychiatric: He has a normal mood and affect. His behavior is normal.  Vitals reviewed.    Labs reviewed: Nursing Home on 01/31/2016  Component Date Value Ref Range Status  . Potassium 01/04/2016 3.5  mmol/L Final  Admission on 01/24/2016, Discharged on 01/29/2016  No results displayed because visit has over 200 results.  CBC Latest Ref  Rng & Units 01/29/2016 01/28/2016 01/27/2016  WBC 4.0 - 10.5 K/uL 12.5(H) 11.7(H) 19.3(H)  Hemoglobin 13.0 - 17.0 g/dL 10.4(L) 10.5(L) 10.9(L)  Hematocrit 39.0 - 52.0 % 34.5(L) 33.5(L) 35.2(L)  Platelets 150 - 400 K/uL 292 280 316   Lab Results  Component Value Date   HGBA1C 6.4 (H) 11/01/2015   CMP Latest Ref Rng & Units 01/28/2016 01/27/2016 01/26/2016  Glucose 65 - 99 mg/dL 193(H) 236(H) 155(H)  BUN 6 - 20 mg/dL 12 11 13   Creatinine 0.61 - 1.24 mg/dL 0.73 0.74 0.64  Sodium 135 - 145 mmol/L 138 137 137  Potassium 3.5 - 5.1 mmol/L 3.8 3.8 4.5  Chloride 101 - 111 mmol/L 103 103 102  CO2 22 - 32 mmol/L 25 26 25   Calcium 8.9 - 10.3 mg/dL 8.5(L) 8.6(L) 8.5(L)  Total Protein 6.5 - 8.1 g/dL - - -  Total Bilirubin 0.3 - 1.2 mg/dL - - -  Alkaline Phos 38 - 126 U/L - - -  AST 15 - 41 U/L - - -  ALT 17 - 63 U/L - - -       No results found.   Assessment/Plan   ICD-9-CM ICD-10-CM   1. Erythema 695.9 L53.9    with possible cellulitis vs allergic reaction vs dermatitis  2. 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 Chronic systolic CHF (congestive heart failure) (HCC) 428.22 I50.22    428.0    4. Other chronic pulmonary embolism without acute cor pulmonale (HCC) 416.2 I27.82    on xeralto  5. S/P AKA (above knee amputation) unilateral, left ([place]) V49.76 Z89.612   6. Peripheral vascular disease (HCC) 443.9 I73.9   7. Essential hypertension 401.9 I10   8. Chronic obstructive pulmonary disease, unspecified COPD type ([place]) 496 J44.9    Pt was seen by NP last week prior to discontinuation of wound vac. No redness noted at that time. Due to frailty of pt and acuteness of rash vs systemic process, pt sent to the ED for eval and tx  Will follow upon readmission to SNF  TIME SPENT: 40 MIN with >50% time coordinating care  [place]. Perlie Gold  [facility] [address] [place], [place] [address] (  [phone] Cell (Monday-Friday 8 AM - 5 PM) [phone] After 5 PM and follow prompts

## 2016-04-30 DIAGNOSIS — E1169 Type 2 diabetes mellitus with other specified complication: Secondary | ICD-10-CM | POA: Diagnosis not present

## 2016-04-30 DIAGNOSIS — I2782 Chronic pulmonary embolism: Secondary | ICD-10-CM | POA: Diagnosis not present

## 2016-04-30 DIAGNOSIS — R6510 Systemic inflammatory response syndrome (SIRS) of non-infectious origin without acute organ dysfunction: Secondary | ICD-10-CM | POA: Diagnosis not present

## 2016-04-30 DIAGNOSIS — I5022 Chronic systolic (congestive) heart failure: Secondary | ICD-10-CM | POA: Diagnosis not present

## 2016-04-30 DIAGNOSIS — I10 Essential (primary) hypertension: Secondary | ICD-10-CM | POA: Diagnosis not present

## 2016-04-30 DIAGNOSIS — F411 Generalized anxiety disorder: Secondary | ICD-10-CM | POA: Diagnosis not present

## 2016-04-30 DIAGNOSIS — L0390 Cellulitis, unspecified: Secondary | ICD-10-CM | POA: Diagnosis not present

## 2016-04-30 LAB — BASIC METABOLIC PANEL
ANION GAP: 9 (ref 5–15)
BUN: 13 mg/dL (ref 6–20)
CALCIUM: 8.3 mg/dL — AB (ref 8.9–10.3)
CO2: 33 mmol/L — ABNORMAL HIGH (ref 22–32)
Chloride: 96 mmol/L — ABNORMAL LOW (ref 101–111)
Creatinine, Ser: 0.67 mg/dL (ref 0.61–1.24)
GFR calc Af Amer: >60 mL/min (ref >60)
Glucose, Bld: 146 mg/dL — ABNORMAL HIGH (ref 65–99)
POTASSIUM: 2.8 mmol/L — AB (ref 3.5–5.1)
SODIUM: 138 mmol/L (ref 135–145)

## 2016-04-30 LAB — URINALYSIS, ROUTINE W REFLEX MICROSCOPIC
Bilirubin Urine: NEGATIVE
GLUCOSE, UA: NEGATIVE mg/dL
HGB URINE DIPSTICK: NEGATIVE
KETONES UR: NEGATIVE mg/dL
LEUKOCYTES UA: NEGATIVE
Nitrite: NEGATIVE
PH: 7 (ref 5.0–8.0)
Protein, ur: NEGATIVE mg/dL
Specific Gravity, Urine: 1.028 (ref 1.005–1.030)

## 2016-04-30 LAB — CBC
HEMATOCRIT: 31.4 % — AB (ref 39.0–52.0)
HEMOGLOBIN: 9.6 g/dL — AB (ref 13.0–17.0)
MCH: 22.6 pg — ABNORMAL LOW (ref 26.0–34.0)
MCHC: 30.6 g/dL (ref 30.0–36.0)
MCV: 74.1 fL — ABNORMAL LOW (ref 78.0–100.0)
Platelets: 345 10*3/uL (ref 150–400)
RBC: 4.24 MIL/uL (ref 4.22–5.81)
RDW: 16.6 % — ABNORMAL HIGH (ref 11.5–15.5)
WBC: 20.3 10*3/uL — AB (ref 4.0–10.5)

## 2016-04-30 LAB — MAGNESIUM: Magnesium: 1.8 mg/dL (ref 1.7–2.4)

## 2016-04-30 LAB — GLUCOSE, CAPILLARY
GLUCOSE-CAPILLARY: 169 mg/dL — AB (ref 65–99)
GLUCOSE-CAPILLARY: 147 mg/dL — AB (ref 65–99)
Glucose-Capillary: 220 mg/dL — ABNORMAL HIGH (ref 65–99)
Glucose-Capillary: 149 mg/dL — ABNORMAL HIGH (ref 65–99)

## 2016-04-30 LAB — PROCALCITONIN: PROCALCITONIN: 0.14 ng/mL

## 2016-04-30 MED ORDER — POTASSIUM CHLORIDE CRYS ER 20 MEQ PO TBCR
40.0000 meq | EXTENDED_RELEASE_TABLET | Freq: Two times a day (BID) | ORAL | Status: AC
  Administered 2016-04-30: 40 meq via ORAL
  Filled 2016-04-30: qty 2

## 2016-04-30 MED ORDER — POTASSIUM CHLORIDE CRYS ER 20 MEQ PO TBCR: 40.0000 meq | EXTENDED_RELEASE_TABLET | Freq: Once | ORAL | Status: DC
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
## 2016-05-01 DIAGNOSIS — I5022 Chronic systolic (congestive) heart failure: Secondary | ICD-10-CM | POA: Diagnosis not present

## 2016-05-01 DIAGNOSIS — I10 Essential (primary) hypertension: Secondary | ICD-10-CM | POA: Diagnosis not present

## 2016-05-01 DIAGNOSIS — L0390 Cellulitis, unspecified: Secondary | ICD-10-CM | POA: Diagnosis not present

## 2016-05-01 DIAGNOSIS — I2782 Chronic pulmonary embolism: Secondary | ICD-10-CM | POA: Diagnosis not present

## 2016-05-01 DIAGNOSIS — R6510 Systemic inflammatory response syndrome (SIRS) of non-infectious origin without acute organ dysfunction: Secondary | ICD-10-CM | POA: Diagnosis not present

## 2016-05-01 LAB — GLUCOSE, CAPILLARY
Glucose-Capillary: 139 mg/dL — ABNORMAL HIGH (ref 65–99)
Glucose-Capillary: 158 mg/dL — ABNORMAL HIGH (ref 65–99)

## 2016-05-01 LAB — CBC
HCT: 31.8 % — ABNORMAL LOW (ref 39.0–52.0)
Hemoglobin: 9.6 g/dL — ABNORMAL LOW (ref 13.0–17.0)
MCH: 22.7 pg — AB (ref 26.0–34.0)
MCHC: 30.2 g/dL (ref 30.0–36.0)
MCV: 75.4 fL — AB (ref 78.0–100.0)
PLATELETS: 318 10*3/uL (ref 150–400)
RBC: 4.22 MIL/uL (ref 4.22–5.81)
RDW: 16.8 % — AB (ref 11.5–15.5)
WBC: 12.7 10*3/uL — AB (ref 4.0–10.5)

## 2016-05-01 LAB — BASIC METABOLIC PANEL
ANION GAP: 7 (ref 5–15)
BUN: 13 mg/dL (ref 6–20)
CALCIUM: 8.6 mg/dL — AB (ref 8.9–10.3)
CO2: 32 mmol/L (ref 22–32)
Chloride: 101 mmol/L (ref 101–111)
Creatinine, Ser: 0.63 mg/dL (ref 0.61–1.24)
GFR calc Af Amer: >60 mL/min (ref >60)
GLUCOSE: 131 mg/dL — AB (ref 65–99)
Potassium: 3.4 mmol/L — ABNORMAL LOW (ref 3.5–5.1)
Sodium: 140 mmol/L (ref 135–145)

## 2016-05-01 LAB — PROCALCITONIN: Procalcitonin: <0.1 ng/mL

## 2016-05-01 MED ORDER — CLINDAMYCIN HCL 300 MG PO CAPS
300.0000 mg | ORAL_CAPSULE | Freq: Three times a day (TID) | ORAL | Status: DC
  Administered 2016-05-01: 300 mg via ORAL
  Filled 2016-05-01 (×2): qty 1

## 2016-05-01 MED ORDER — CLINDAMYCIN HCL 300 MG PO CAPS: 300.0000 mg | ORAL_CAPSULE | Freq: Three times a day (TID) | ORAL | 0 refills | Status: AC
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
## 2016-05-01 NOTE — NC FL2 (Signed)
[place] MEDICAID FL2 LEVEL OF CARE SCREENING TOOL     IDENTIFICATION  Patient Name: [name] Birthdate: 09-25-1940 Sex: male Admission Date (Current Location): 04/29/2016  [facility] [place] and [place] Number:  Herbalist and Address:  [facility],  [place] [address], [place]      Provider Number: [id number]  Attending Physician Name and Address:  Patrecia Pour, MD  Relative Name and Phone Number:       Current Level of Care: Hospital Recommended Level of Care: [place] Prior Approval Number:    Date Approved/Denied:   PASRR Number:    Discharge Plan: SNF    Current Diagnoses: Patient Active Problem List   Diagnosis Date Noted  . S/P AKA (above knee amputation) unilateral, left ([place]) 04/29/2016  . Cellulitis 04/29/2016  . 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 Dyslipidemia associated with type 2 diabetes mellitus ([place]) 02/15/2016  . Sepsis affecting skin ([place])   . Cellulitis of right leg 01/24/2016  . Panlobular emphysema ([place])   . Gangrene ([place])   . Septic shock ([place]) 10/31/2015  . Hypokalemia   . Microcytic anemia 10/19/2015  . Atherosclerosis of native arteries of the extremities with ulceration ([place]) 08/03/2015  . Chronic pulmonary embolism ([place]) 06/18/2015  . Diabetic peripheral neuropathy associated with type 2 diabetes mellitus ([place]) 06/18/2015  . Allergic rhinitis 05/22/2015  . Constipation due to opioid therapy 05/06/2015  . Vitamin D deficiency 05/03/2015  . OA (osteoarthritis) 05/12/2014  . Diabetes type II with atherosclerosis of arteries of extremities ([place]) 05/12/2014  . GERD (gastroesophageal reflux disease) 11/10/2011  . Peripheral vascular disease ([place]) 04/16/2011  . Chronic obstructive pulmonary disease ([place]) 02/27/2011  . BPH (benign prostatic hyperplasia) 11/01/2010  . Long term current use of anticoagulant therapy 05/03/2010  . Bilateral shoulder pain  11/19/2009  . Depression 07/08/2006  . Essential hypertension 07/08/2006  . Anxiety state 05/21/2006  . Coronary atherosclerosis 05/21/2006  . Chronic systolic CHF (congestive heart failure) ([place]) 05/21/2006    Orientation RESPIRATION BLADDER Height & Weight     Self, Place  O2 Incontinent Weight: 215 lb 9.8 oz (97.8 kg) Height:  5\' 7"  (170.2 cm)  BEHAVIORAL SYMPTOMS/MOOD NEUROLOGICAL BOWEL NUTRITION STATUS  Other (Comment) (no behaviors)   Incontinent Diet  AMBULATORY STATUS COMMUNICATION OF NEEDS Skin   Extensive Assist Verbally Normal                       Personal Care Assistance Level of Assistance  Bathing, Feeding, Dressing Bathing Assistance: Maximum assistance Feeding assistance: Limited assistance Dressing Assistance: Maximum assistance     Functional Limitations Info  Sight, Hearing, Speech Sight Info: Adequate Hearing Info: Adequate Speech Info: Adequate    SPECIAL CARE FACTORS FREQUENCY                       Contractures Contractures Info: Not present    Additional Factors Info  Code Status Code Status Info: DNR             Current Medications (05/01/2016):  This is the current hospital active medication list Current Facility-Administered Medications  Medication Dose Route Frequency Provider Last Rate Last Dose  . acetaminophen (TYLENOL) tablet 650 mg  650 mg Oral Q6H PRN Maryann Mikhail, DO      . atorvastatin (LIPITOR) tablet 20 mg  20 mg Oral QHS Maryann Mikhail, DO   20 mg at 04/30/16 2156  .  carvedilol (COREG) tablet 3.125 mg  3.125 mg Oral BID WC Maryann Mikhail, DO   3.125 mg at 05/01/16 0819  . clindamycin (CLEOCIN) capsule 300 mg  300 mg Oral Q8H Patrecia Pour, MD      . docusate sodium (COLACE) capsule 100 mg  100 mg Oral BID Maryann Mikhail, DO   100 mg at 05/01/16 [id number]  . DULoxetine (CYMBALTA) DR capsule 90 mg  90 mg Oral Q breakfast Maryann Mikhail, DO   90 mg at 05/01/16 [id number]  . feeding supplement (PRO-STAT SUGAR FREE 64)  liquid 30 mL  30 mL Oral BID Maryann Mikhail, DO      . ferrous sulfate tablet 325 mg  325 mg Oral Q breakfast Maryann Mikhail, DO   325 mg at 05/01/16 [id number]  . gabapentin (NEURONTIN) capsule 600 mg  600 mg Oral TID Maryann Mikhail, DO   600 mg at 05/01/16 [id number]  . HYDROcodone-acetaminophen (NORCO) 7.5-325 MG per tablet 1 tablet  1 tablet Oral Q4H PRN Cristal Ford, DO   1 tablet at 04/30/16 2156  . insulin aspart (novoLOG) injection 0-15 Units  0-15 Units Subcutaneous TID WC Maryann Mikhail, DO   2 Units at 05/01/16 [id number]  . insulin aspart (novoLOG) injection 0-5 Units  0-5 Units Subcutaneous QHS Maryann Mikhail, DO      . lidocaine (LIDODERM) 5 % 1 patch  1 patch Transdermal Q24H Maryann Mikhail, DO   1 patch at 05/01/16 [phone]  . loratadine (CLARITIN) tablet 10 mg  10 mg Oral Daily Maryann Mikhail, DO   10 mg at 05/01/16 0820  . LORazepam (ATIVAN) tablet 0.5 mg  0.5 mg Oral BID PRN Maryann Mikhail, DO      . lubiprostone (AMITIZA) capsule 8 mcg  8 mcg Oral BID WC Maryann Mikhail, DO   8 mcg at 05/01/16 [id number]  . MEDLINE mouth rinse  15 mL Mouth Rinse BID Maryann Mikhail, DO   15 mL at 04/30/16 2200  . methocarbamol (ROBAXIN) tablet 500 mg  500 mg Oral TID Maryann Mikhail, DO   500 mg at 05/01/16 [id number]  . ondansetron (ZOFRAN) tablet 4 mg  4 mg Oral Q6H PRN Maryann Mikhail, DO       Or  . ondansetron (ZOFRAN) injection 4 mg  4 mg Intravenous Q6H PRN Maryann Mikhail, DO      . pantoprazole (PROTONIX) EC tablet 40 mg  40 mg Oral Daily Maryann Mikhail, DO   40 mg at 05/01/16 [id number]  . polyvinyl alcohol (LIQUIFILM TEARS) 1.4 % ophthalmic solution 1 drop  1 drop Both Eyes BID Maryann Mikhail, DO   1 drop at 04/30/16 2200  . potassium chloride SA (K-DUR,KLOR-CON) CR tablet 20 mEq  20 mEq Oral Q breakfast Maryann Mikhail, DO   20 mEq at 05/01/16 0820  . rivaroxaban (XARELTO) tablet 20 mg  20 mg Oral Q supper Maryann Mikhail, DO   20 mg at 04/30/16 1625  . tamsulosin (FLOMAX) capsule 0.4 mg  0.4 mg Oral QPC  supper Maryann Mikhail, DO   0.4 mg at 04/30/16 1625  . traZODone (DESYREL) tablet 100 mg  100 mg Oral QHS Maryann Mikhail, DO   100 mg at 04/30/16 2155  . vitamin A & D ointment 1 application  1 application Topical BID PRN Maryann Mikhail, DO      . Vitamin D (Ergocalciferol) (DRISDOL) capsule 50,000 Units  50,000 Units Oral Q Thu Maryann Mikhail, DO   50,000 Units at 05/01/16 [id number]     Discharge Medications:  Please see discharge summary for a list of discharge medications.  Relevant Imaging Results:  Relevant Lab Results:   Additional Information SS#: [id number]  Archit Leger, Randall An, LCSW

## 2016-05-01 NOTE — Care Management Obs Status (Addendum)
[place] NOTIFICATION   Patient Details  Name: [name] MRN: [id number] Date of Birth: 1940/09/28   Medicare Observation Status Notification Given:  Yes, pt unable to sign MOON notice but gave permission for Care Manager to sign with Witness of Jena Gauss, RN    Radhika Dershem, Antony Haste, RN 05/01/2016, 9:29 AM

## 2016-05-01 NOTE — Progress Notes (Signed)
PT Cancellation Note  Patient Details Name: WAYLIN SLUIS MRN: [id number] DOB: 12/14/1940   Cancelled Treatment:    Reason Eval/Treat Not Completed: PT screened, no needs identified, will sign off. Long term SNF resident   Claretha Cooper 05/01/2016, 1:55 PM Tresa Endo PT [phone]

## 2016-05-01 NOTE — Progress Notes (Signed)
Dc to [email or web address] [facility].Dierdre Highman sent with [facility] STAFF

## 2016-05-01 NOTE — Care Management Note (Signed)
Case Management Note  Patient Details  Name: [name] MRN: [id number] Date of Birth: 03-Apr-1940  Subjective/Objective:   76 y.o. M admitted 04/29/2016 with Cellulitis. Pt is a resident ofm [facility] and will return at discharge.                   Action/Plan: Anticipate discharge to [facility] today. . No further CM needs but will be available should additional discharge needs arise.   Expected Discharge Date:  05/01/16               Expected Discharge Plan:  Skilled Nursing Facility  In-House Referral:  Clinical Social Work  Discharge planning Services  CM Consult  Post Acute Care Choice:  NA Choice offered to:  Patient  DME Arranged:  N/A DME Agency:  NA  HH Arranged:  NA HH Agency:  NA  Status of Service:  Completed, signed off  If discussed at [employer] of Stay Meetings, dates discussed:    Additional Comments:  Delrae Sawyers, RN 05/01/2016, 9:30 AM

## 2016-05-01 NOTE — Progress Notes (Signed)
Pt is ready to return to [facility] today. Pt is in agreement with d/c plan. CSW has left message for pt's spouse. Awaiting return. CSW contacted pt's daughter with d/c update. She assist with trying to contact her step mother with d/c update. [facility] transport is required. Medical necessity form completed. D/C Summary sent to SNF for review. No scripts printed. # for report provided to nsg.  Werner Lean LCSW [phone]

## 2016-05-02 ENCOUNTER — Telehealth: Payer: Self-pay

## 2016-05-02 NOTE — Telephone Encounter (Signed)
Possible re-admission to facility. This is a patient you were seeing at  [facility]. [facility] F/U is needed if patient was re-admitted to facility upon discharge. Hospital discharge from [facility] on 05/01/16.

## 2016-05-05 ENCOUNTER — Non-Acute Institutional Stay (SKILLED_NURSING_FACILITY): Payer: Medicare Other | Admitting: Adult Health

## 2016-05-05 DIAGNOSIS — E1151 Type 2 diabetes mellitus with diabetic peripheral angiopathy without gangrene: Secondary | ICD-10-CM

## 2016-05-05 DIAGNOSIS — N400 Enlarged prostate without lower urinary tract symptoms: Secondary | ICD-10-CM

## 2016-05-05 DIAGNOSIS — E785 Hyperlipidemia, unspecified: Secondary | ICD-10-CM

## 2016-05-05 DIAGNOSIS — I5022 Chronic systolic (congestive) heart failure: Secondary | ICD-10-CM | POA: Diagnosis not present

## 2016-05-05 DIAGNOSIS — I7025 Atherosclerosis of native arteries of other extremities with ulceration: Secondary | ICD-10-CM

## 2016-05-05 DIAGNOSIS — I2782 Chronic pulmonary embolism: Secondary | ICD-10-CM | POA: Diagnosis not present

## 2016-05-05 DIAGNOSIS — I739 Peripheral vascular disease, unspecified: Secondary | ICD-10-CM

## 2016-05-05 DIAGNOSIS — E1142 Type 2 diabetes mellitus with diabetic polyneuropathy: Secondary | ICD-10-CM

## 2016-05-05 DIAGNOSIS — I70209 Unspecified atherosclerosis of native arteries of extremities, unspecified extremity: Secondary | ICD-10-CM

## 2016-05-05 DIAGNOSIS — I130 Hypertensive heart and chronic kidney disease with heart failure and stage 1 through stage 4 chronic kidney disease, or unspecified chronic kidney disease: Secondary | ICD-10-CM

## 2016-05-05 DIAGNOSIS — J431 Panlobular emphysema: Secondary | ICD-10-CM

## 2016-05-05 DIAGNOSIS — E1169 Type 2 diabetes mellitus with other specified complication: Secondary | ICD-10-CM

## 2016-05-06 ENCOUNTER — Non-Acute Institutional Stay (SKILLED_NURSING_FACILITY): Payer: Medicare Other | Admitting: Internal Medicine

## 2016-05-06 ENCOUNTER — Encounter: Payer: Self-pay | Admitting: Internal Medicine

## 2016-05-06 DIAGNOSIS — E1151 Type 2 diabetes mellitus with diabetic peripheral angiopathy without gangrene: Secondary | ICD-10-CM | POA: Diagnosis not present

## 2016-05-06 DIAGNOSIS — Z89612 Acquired absence of left leg above knee: Secondary | ICD-10-CM | POA: Diagnosis not present

## 2016-05-06 DIAGNOSIS — N2889 Other specified disorders of kidney and ureter: Secondary | ICD-10-CM

## 2016-05-06 DIAGNOSIS — R7401 Elevation of levels of liver transaminase levels: Secondary | ICD-10-CM

## 2016-05-06 DIAGNOSIS — I70209 Unspecified atherosclerosis of native arteries of extremities, unspecified extremity: Secondary | ICD-10-CM

## 2016-05-06 DIAGNOSIS — I10 Essential (primary) hypertension: Secondary | ICD-10-CM | POA: Diagnosis not present

## 2016-05-06 DIAGNOSIS — I2782 Chronic pulmonary embolism: Secondary | ICD-10-CM

## 2016-05-06 DIAGNOSIS — M159 Polyosteoarthritis, unspecified: Secondary | ICD-10-CM

## 2016-05-06 DIAGNOSIS — E876 Hypokalemia: Secondary | ICD-10-CM

## 2016-05-06 DIAGNOSIS — I5022 Chronic systolic (congestive) heart failure: Secondary | ICD-10-CM

## 2016-05-06 DIAGNOSIS — M150 Primary generalized (osteo)arthritis: Secondary | ICD-10-CM | POA: Diagnosis not present

## 2016-05-06 DIAGNOSIS — R740 Nonspecific elevation of levels of transaminase and lactic acid dehydrogenase [LDH]: Secondary | ICD-10-CM

## 2016-05-06 NOTE — Progress Notes (Signed)
Patient ID: LEMOYNE NESTOR, male   DOB: 07/18/1940, 76 y.o.   MRN: [id number]    HISTORY AND PHYSICAL   DATE: 05/06/2016  Location:    [facility] [place] Room Number: 159 B Place of Service: SNF (31)   Extended Emergency Contact Information Primary Emergency Contact: Lianne Moris Address: [place], [place] Mobile Phone: [phone] Relation: Spouse Secondary Emergency Contact: Stedron,Cathy  [place] Mobile Phone: [phone] Relation: Daughter Preferred language: English  Advanced Directive information Does Patient Have a Medical Advance Directive?: Yes, Type of Advance Directive: Out of facility DNR (pink MOST or yellow form), Pre-existing out of facility DNR order (yellow form or pink MOST form): Yellow form placed in chart (order not valid for inpatient use)  Chief Complaint  Patient presents with  . Readmit To SNF    HPI:  76 yo male long term resident seen today for readmission into SNF following hospital stay for SIRS, cellulitis, right renal mass, anemia. Rash improved with IV vanco --> po clindamycin (stop date 05/03/16). CXR revealed no acute process. 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 O/p Urology appt recommended for possible surgical resection. Hgb 10.7--> 9.6; WBC 24.4K-->12.7K; UA neg acute sediment changes; K 3.1-->3.4; albumin 2.8; AST 60; ALT 64; alk phos 164 at d/c. He presents to SNF to continue long term care.  Today he reports back pain as he has been sitting up in w/c for some time this AM and is ready to return to bed. No f/c. He completed abx for cellulitis. No nursing concerns. No falls. Appetite reduced. Sleeps ok.  Chronic systolic heart failure - EF 30-35% (10-22-15). Takes lasix 80 mg twice daily with k+ 20 meq  Daily;  coreg 3.125 mg twice daily   Hypertension - stable on coreg  3.125 mg twice daily   CAD s/p CABG and stents - stable on ASA 81 mg daily and ntg prn  Chronic PE - stable on xarelto 20 mg daily  Hyperlipidemia - stable on liptor 20 mg daily. LDL 87  DM - stable. A1c 5.7%. Takes NPH 20 units twice daily and humalog 8 units with meals with an additional 3 units for cbg >150. 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 stable on ASA 81 mg; pain stable on vicodin 7.5/325 mg every 4 hours as needed   Hx Anemia - stable on iron daily. Hgb 10.4  COPD/chronic respiratory failure with hypoxia - stable on chronic O2 therapy. No recent exacerbations  Constipation - stable on colace twice daily; senna s twice daily; miralax daily; amitiza 8 mcg twice daily   Chronic pain syndrome/Peripheral neuropathy - stable on cymbalta 90 mg daily; neurontin 600 mg three times daily; robaxin 500 mg three times daily for spasms  Anxiety with depression - mood stable on cymbalta 90 mg daily; trazodone 100 mg nightly;  ativan 0.5 mg twice daily as needed  GERD - stable on prilosec 40 mg daily  Bilateral shoulder pain - stable on lidoderm patch; vicodin 7.5/325 mg every 4 hours as needed   Vit D deficiency - stable on vitamin d 50,000 units weekly   BPH - stable on flomax 0.4 mg daily    Past Medical History:  Diagnosis  Date  . Adhesive capsulitis   . Angina   . Anxiety   . CAD (coronary artery disease)   . CHF (congestive heart failure) ([place])   . Chronic back pain   . Chronic kidney disease    hx of BPH  . COPD (chronic obstructive pulmonary disease) ([place])   . CVA (cerebral infarction) Questionable history  . Depression   . Diabetes mellitus   . Diverticulosis   . Falls frequently 06/2014  . GERD (gastroesophageal reflux disease)   . Hyperlipidemia   . Hypertension   . 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 Myocardial infarction    . Neuromuscular disorder ([place])    HX of diabetic periferal neuropathy  . On home oxygen therapy    "1.5L prn" (04/12/2014)  . Osteomyelitis ([place]) 2012   Sternoclavicular joint   . PE (pulmonary embolism) 10-15 years ago   Lifelong Coumadin  . Peripheral vascular disease ([place])   . Shortness of breath   . Sleep apnea     Past Surgical History:  Procedure Laterality Date  . AMPUTATION  03/16/2011   Procedure: AMPUTATION DIGIT;  Surgeon: Angelia Mould, MD;  Location: [facility];  Service: Vascular;  Laterality: Left;  Great toe  . AMPUTATION Left 11/08/2015   Procedure: AMPUTATION ABOVE KNEE LEFT;  Surgeon: Waynetta Sandy, MD;  Location: [facility];  Service: Vascular;  Laterality: Left;  . CARDIAC CATHETERIZATION  October 18, 2010   Known obstructive disease, no further blockages  . CARDIAC CATHETERIZATION  03/17/2005   EF 35-40%  . COLONOSCOPY  2006    Multiple polyps removed, repeat in 5 years  . CORONARY ARTERY BYPASS GRAFT  1992  . [place], 2002  . DOPPLER ECHOCARDIOGRAPHY  Sept 2011   EF 50-55% with some impaired diastolic relaxation  . FEMORAL-POPLITEAL BYPASS GRAFT  2008   Left  . FRACTURE SURGERY  1980s   Hip  . HIP ARTHROPLASTY Left   . PERIPHERAL VASCULAR CATHETERIZATION N/A 11/06/2015   Procedure: Abdominal Aortogram;  Surgeon: Serafina Mitchell, MD;  Location: [facility];  Service: Cardiovascular;  Laterality: N/A;  . PERIPHERAL VASCULAR CATHETERIZATION Bilateral 11/06/2015   Procedure: Lower Extremity Angiography;  Surgeon: Serafina Mitchell, MD;  Location: [place] [facility];  Service: Cardiovascular;  Laterality: Bilateral;  . Right iliopopliteal bypass - [place]  . septic arthritis     Removal of infected CABG wire by Dr Arlyce Dice - 2011  . SPINE SURGERY  2002   Cervical fusion vertebroplasty C3-4-5  . US ECHOCARDIOGRAPHY  03/04/2006   EF 50-55%    Patient Care Team: Gildardo Cranker, DO as PCP - General (Internal  Medicine) Darlin Coco, MD (Cardiology) Clent Jacks, MD (Ophthalmology) Christin Fudge, MD as Consulting Physician (Surgery) Rehab Nurse Manager Inpatient, RN as Registered Nurse  Social History   Social History  . Marital status: Married    Spouse name: Butch Penny  . Number of children: 3  . Years of education: 12   Occupational History  . Retired-machinest/welder Retired   Social History Main Topics  . Smoking status: Former Smoker    Quit date: 06/19/2000  . Smokeless tobacco: Never Used  . Alcohol use No  . Drug use: No  . Sexual activity: No   Other Topics Concern  . Not on file   Social History Narrative   Health Care POA: TBD   Emergency Contact: wife, Butch Penny  [phone]   End of Life Plan: pt does not have AD and not interested.   Who lives with you: Butch Penny and step-daughter-Betty   Any pets: none   Diet: Pt has a varied diet of protein, starch and vegetables.   Exercise: Pt does not have regular exercise routine.   Seatbelts: Pt reports wearing seatbelt when in vehicles.    Hobbies: listening to car races.               reports that he quit smoking about 15 years ago. He has never used smokeless tobacco. He reports that he does not drink alcohol or use drugs.  Family History  Problem Relation Age of Onset  . Heart disease Father   . Heart disease Mother    Family Status  Relation Status  . Father Deceased  . Mother Deceased    Immunization History  Administered Date(s) Administered  . Influenza Split 12/12/2010  . Influenza Whole 12/22/2005, 12/14/2007  . Influenza,inj,Quad PF,36+ Mos 01/20/2013, 12/09/2013  . Influenza-Unspecified 12/15/2014, 01/15/2016  . Pneumococcal Conjugate-13 10/10/2013  . Pneumococcal Polysaccharide-23 11/23/1999, 03/25/2003, 05/25/2011  . Td 10/23/2003  . Tdap 07/17/2014    Allergies  Allergen Reactions  . Diazepam Anxiety    REACTION: makes patient cry  . Vancomycin Other (See Comments)    "red man syndrome"     Medications: Patient's Medications  New Prescriptions   No medications on file  Previous Medications   ACETAMINOPHEN (TYLENOL) 325 MG TABLET    Take 650 mg by mouth every 6 (six) hours as needed for fever.   AMINO ACIDS-PROTEIN HYDROLYS (FEEDING SUPPLEMENT, PRO-STAT SUGAR FREE 64,) LIQD    Take 30 mLs by mouth 2 (two) times daily.   ASPIRIN EC 81 MG EC TABLET    Take 1 tablet (81 mg total) by mouth daily.   ATORVASTATIN (LIPITOR) 20 MG TABLET    Take 20 mg by mouth at bedtime.    BISACODYL (DULCOLAX) 10 MG SUPPOSITORY    Place 1 suppository (10 mg total) rectally daily as needed for moderate constipation.   CARVEDILOL (COREG) 3.125 MG TABLET    Take 3.125 mg by mouth at bedtime.   CETIRIZINE (ZYRTEC ALLERGY) 10 MG TABLET    Take 10 mg by mouth daily with breakfast.    DOCUSATE SODIUM (COLACE) 100 MG CAPSULE    Take 100 mg by mouth 2 (two) times daily.   DULOXETINE (CYMBALTA) 30 MG CAPSULE    Take 90 mg by mouth daily with breakfast.    FERROUS SULFATE 325 (65 FE) MG TABLET    Take 1 tablet (325 mg total) by mouth daily with breakfast.   FUROSEMIDE (LASIX) 80 MG TABLET    Take 1 tablet (80 mg total) by mouth 2 (two) times daily.   GABAPENTIN (NEURONTIN) 600 MG TABLET    Take 600 mg by mouth 3 (three) times daily.    GUAIFENESIN-DEXTROMETHORPHAN (ROBITUSSIN DM) 100-10 MG/5ML SYRUP    Take 15 mLs by mouth every 4 (four) hours as needed for cough.   HYDROCODONE-ACETAMINOPHEN (NORCO/VICODIN) 5-325 MG TABLET    Take 1 tablet by mouth every 4 (four) hours as needed for moderate pain.   INSULIN LISPRO (HUMALOG) 100 UNIT/ML CARTRIDGE    Inject 0-13 Units into the skin 3 (three) times daily with meals. Inject as per sliding scale: if  0-70=0 units; 71-149 = 8 units, 150-600= 13 units subcutaneously before meals related to DM.   INSULIN NPH HUMAN (HUMULIN N,NOVOLIN N) 100 UNIT/ML INJECTION  Inject 0.2 mLs (20 Units total) into the skin 2 (two) times daily before a meal.   LIDOCAINE (LIDODERM) 5 %     Place 1 patch onto the skin daily. *Apply 0.5 a patch to each shoulder every morning*   LORAZEPAM (ATIVAN) 0.5 MG TABLET    Take 1 tablet (0.5 mg total) by mouth 2 (two) times daily as needed for anxiety.   LUBIPROSTONE (AMITIZA) 8 MCG CAPSULE    Take 8 mcg by mouth 2 (two) times daily with a meal.   METHOCARBAMOL (ROBAXIN) 500 MG TABLET    Take 1 tablet (500 mg total) by mouth 3 (three) times daily.   MULTIPLE VITAMINS-MINERALS (DECUBI-VITE PO)    Take 1 capsule by mouth daily with breakfast.    NITROGLYCERIN (NITROSTAT) 0.4 MG SL TABLET    Place 1 tablet (0.4 mg total) under the tongue as needed. For chest pains   OMEPRAZOLE (PRILOSEC) 40 MG CAPSULE    Take 1 capsule (40 mg total) by mouth daily.   OXYGEN    Inhale 2 L into the lungs continuous.    POLYETHYLENE GLYCOL (MIRALAX / GLYCOLAX) PACKET    Take 17 g by mouth daily with breakfast.    POTASSIUM CHLORIDE SA (K-DUR,KLOR-CON) 20 MEQ TABLET    Take 20 mEq by mouth daily with breakfast.    PROPYLENE GLYCOL (SYSTANE BALANCE) 0.6 % SOLN    Place 1 drop into both eyes 2 (two) times daily.    RIVAROXABAN (XARELTO) 20 MG TABS TABLET    Take 20 mg by mouth daily with supper.   SENNA-DOCUSATE (SENOKOT-S) 8.6-50 MG TABLET    Take 1 tablet by mouth 2 (two) times daily.   SKIN PROTECTANTS, MISC. (CALAZIME SKIN PROTECTANT EX)    Apply 1 application topically daily. Apply to sacrum and right buttock every day   TAMSULOSIN (FLOMAX) 0.4 MG CAPS CAPSULE    Take 1 capsule (0.4 mg total) by mouth daily after supper.   TRAZODONE (DESYREL) 100 MG TABLET    Take 100 mg by mouth at bedtime.    VITAMIN D, ERGOCALCIFEROL, (DRISDOL) 50000 UNITS CAPS CAPSULE    Take 1 capsule (50,000 Units total) by mouth every 7 (seven) days.   VITAMINS A & D (VITAMIN A & D) OINTMENT    Apply 1 application topically 2 (two) times daily as needed for dry skin.  Modified Medications   No medications on file  Discontinued Medications   CARVEDILOL (COREG) 3.125 MG TABLET    Take 1  tablet (3.125 mg total) by mouth 2 (two) times daily with a meal.   HYDROCODONE-ACETAMINOPHEN (NORCO) 7.5-325 MG TABLET    Take one tablet by mouth every 4 hours as needed for pain, DNE 3gm of APAP from all sources/24hours.    Review of Systems  Constitutional: Positive for appetite change.  Musculoskeletal: Positive for back pain and gait problem.  All other systems reviewed and are negative.   Vitals:   05/06/16 1110  BP: (!) 149/53  Resp: 20  Temp: 99.1 F (37.3 C)  TempSrc: Oral  Weight: 215 lb 9.8 oz (97.8 kg)  Height: _0  (1.702 m)   Body mass index is 33.77 kg/m.  Physical Exam  Constitutional: He appears well-developed.  Looks uncomfortable, frail appearing in NAD. Sitting in w/c. [place] O2 intact  HENT:  Mouth/Throat: Oropharynx is clear and moist. No oropharyngeal exudate.  MMM; no oral thrush  Eyes: Pupils are equal, round, and reactive to light. No scleral icterus.  Neck: Neck supple. Carotid bruit is not present. No thyromegaly present.  Cardiovascular: Normal rate, regular rhythm and intact distal pulses.  Exam reveals no gallop and no friction rub.   Murmur (1/6 SEM) heard. +1 pitting RLE edema. No right calf TTP. Left AKA  Pulmonary/Chest: Effort normal. No respiratory distress. He has no wheezes. He has no rales. He exhibits no tenderness.  Reduced BS at base b/l. No w/r/r  Abdominal: Soft. Bowel sounds are normal. He exhibits no distension, no fluid wave, no abdominal bruit, no pulsatile midline mass and no mass. There is no hepatomegaly. There is no tenderness. There is no rebound and no guarding.  Musculoskeletal: He exhibits edema, tenderness and deformity (left AKA no dehiscence).  Lymphadenopathy:    He has no cervical adenopathy.  Neurological: He is alert.  Skin: Skin is warm, dry and intact. No rash noted. No erythema.  Postinflammatory changes noted on back and b/l thigh. No vesicular formation  Psychiatric: He has a normal mood and affect. His  behavior is normal.  Vitals reviewed.    Labs reviewed: Admission on 04/29/2016, Discharged on 05/01/2016  Component Date Value Ref Range Status  . WBC 04/29/2016 24.4* 4.0 - 10.5 K/uL Final  . RBC 04/29/2016 4.69  4.22 - 5.81 MIL/uL Final  . Hemoglobin 04/29/2016 10.7* 13.0 - 17.0 g/dL Final  . HCT 04/29/2016 34.6* 39.0 - 52.0 % Final  . MCV 04/29/2016 73.8* 78.0 - 100.0 fL Final  . MCH 04/29/2016 22.8* 26.0 - 34.0 pg Final  . MCHC 04/29/2016 30.9  30.0 - 36.0 g/dL Final  . RDW 04/29/2016 16.7* 11.5 - 15.5 % Final  . Platelets 04/29/2016 401* 150 - 400 K/uL Final  . Neutrophils Relative % 04/29/2016 84  % Final  . Lymphocytes Relative 04/29/2016 7  % Final  . Monocytes Relative 04/29/2016 8  % Final  . Eosinophils Relative 04/29/2016 1  % Final  . Basophils Relative 04/29/2016 0  % Final  . Neutro Abs 04/29/2016 20.5* 1.7 - 7.7 K/uL Final  . Lymphs Abs 04/29/2016 1.7  0.7 - 4.0 K/uL Final  . Monocytes Absolute 04/29/2016 2.0* 0.1 - 1.0 K/uL Final  . Eosinophils Absolute 04/29/2016 0.2  0.0 - 0.7 K/uL Final  . Basophils Absolute 04/29/2016 0.0  0.0 - 0.1 K/uL Final  . RBC Morphology 04/29/2016 POLYCHROMASIA PRESENT   Final  . Sodium 04/29/2016 135  135 - 145 mmol/L Final  . Potassium 04/29/2016 3.1* 3.5 - 5.1 mmol/L Final  . Chloride 04/29/2016 92* 101 - 111 mmol/L Final  . CO2 04/29/2016 32  22 - 32 mmol/L Final  . Glucose, Bld 04/29/2016 131* 65 - 99 mg/dL Final  . BUN 04/29/2016 16  6 - 20 mg/dL Final  . Creatinine, Ser 04/29/2016 0.66  0.61 - 1.24 mg/dL Final  . Calcium 04/29/2016 8.7* 8.9 - 10.3 mg/dL Final  . Total Protein 04/29/2016 6.6  6.5 - 8.1 g/dL Final  . Albumin 04/29/2016 2.8* 3.5 - 5.0 g/dL Final  . AST 04/29/2016 60* 15 - 41 U/L Final  . ALT 04/29/2016 64* 17 - 63 U/L Final  . Alkaline Phosphatase 04/29/2016 164* 38 - 126 U/L Final  . Total Bilirubin 04/29/2016 0.6  0.3 - 1.2 mg/dL Final  . GFR calc non Af Amer 04/29/2016 >60  >60 mL/min Final  . GFR calc  Af Amer 04/29/2016 >60  >60 mL/min Final   Comment: (NOTE) The eGFR has been calculated using the CKD EPI equation. This calculation has not  been validated in all clinical situations. eGFR's persistently <60 mL/min signify possible Chronic Kidney Disease.   . Anion gap 04/29/2016 11  5 - 15 Final  . Ammonia 04/29/2016 38* 9 - 35 umol/L Final  . Lactic Acid, Venous 04/29/2016 2.05* 0.5 - 1.9 mmol/L Final  . Comment 04/29/2016 NOTIFIED PHYSICIAN   Final  . Troponin i, poc 04/29/2016 0.02  0.00 - 0.08 ng/mL Final  . Comment 3 04/29/2016          Final   Comment: Due to the release kinetics of cTnI, a negative result within the first hours of the onset of symptoms does not rule out myocardial infarction with certainty. If myocardial infarction is still suspected, repeat the test at appropriate intervals.   . Prothrombin Time 04/29/2016 16.9* 11.4 - 15.2 seconds Final  . INR 04/29/2016 1.36   Final  . Procalcitonin 04/29/2016 0.20  ng/mL Final   Comment:        Interpretation: PCT (Procalcitonin) <= 0.5 ng/mL: Systemic infection (sepsis) is not likely. Local bacterial infection is possible. (NOTE)         ICU PCT Algorithm               Non ICU PCT Algorithm    ----------------------------     ------------------------------         PCT < 0.25 ng/mL                 PCT < 0.1 ng/mL     Stopping of antibiotics            Stopping of antibiotics       strongly encouraged.               strongly encouraged.    ----------------------------     ------------------------------       PCT level decrease by               PCT < 0.25 ng/mL       >= 80% from peak PCT       OR PCT 0.25 - 0.5 ng/mL          Stopping of antibiotics                                             encouraged.     Stopping of antibiotics           encouraged.    ----------------------------     ------------------------------       PCT level decrease by              PCT >= 0.25 ng/mL       < 80% from peak PCT        AND  PCT >= 0.5 ng/mL            Continuin                          g antibiotics                                              encouraged.       Continuing antibiotics            encouraged.    ----------------------------     ------------------------------  PCT level increase compared          PCT > 0.5 ng/mL         with peak PCT AND          PCT >= 0.5 ng/mL             Escalation of antibiotics                                          strongly encouraged.      Escalation of antibiotics        strongly encouraged.   . Procalcitonin 04/30/2016 0.14  ng/mL Final   Comment:        Interpretation: PCT (Procalcitonin) <= 0.5 ng/mL: Systemic infection (sepsis) is not likely. Local bacterial infection is possible. (NOTE)         ICU PCT Algorithm               Non ICU PCT Algorithm    ----------------------------     ------------------------------         PCT < 0.25 ng/mL                 PCT < 0.1 ng/mL     Stopping of antibiotics            Stopping of antibiotics       strongly encouraged.               strongly encouraged.    ----------------------------     ------------------------------       PCT level decrease by               PCT < 0.25 ng/mL       >= 80% from peak PCT       OR PCT 0.25 - 0.5 ng/mL          Stopping of antibiotics                                             encouraged.     Stopping of antibiotics           encouraged.    ----------------------------     ------------------------------       PCT level decrease by              PCT >= 0.25 ng/mL       < 80% from peak PCT        AND PCT >= 0.5 ng/mL            Continuin                          g antibiotics                                              encouraged.       Continuing antibiotics            encouraged.    ----------------------------     ------------------------------     PCT level increase compared          PCT > 0.5 ng/mL  with peak PCT AND          PCT >= 0.5 ng/mL             Escalation  of antibiotics                                          strongly encouraged.      Escalation of antibiotics        strongly encouraged.   . Sodium 04/30/2016 138  135 - 145 mmol/L Final  . Potassium 04/30/2016 2.8* 3.5 - 5.1 mmol/L Final  . Chloride 04/30/2016 96* 101 - 111 mmol/L Final  . CO2 04/30/2016 33* 22 - 32 mmol/L Final  . Glucose, Bld 04/30/2016 146* 65 - 99 mg/dL Final  . BUN 04/30/2016 13  6 - 20 mg/dL Final  . Creatinine, Ser 04/30/2016 0.67  0.61 - 1.24 mg/dL Final  . Calcium 04/30/2016 8.3* 8.9 - 10.3 mg/dL Final  . GFR calc non Af Amer 04/30/2016 >60  >60 mL/min Final  . GFR calc Af Amer 04/30/2016 >60  >60 mL/min Final   Comment: (NOTE) The eGFR has been calculated using the CKD EPI equation. This calculation has not been validated in all clinical situations. eGFR's persistently <60 mL/min signify possible Chronic Kidney Disease.   . Anion gap 04/30/2016 9  5 - 15 Final  . WBC 04/30/2016 20.3* 4.0 - 10.5 K/uL Final  . RBC 04/30/2016 4.24  4.22 - 5.81 MIL/uL Final  . Hemoglobin 04/30/2016 9.6* 13.0 - 17.0 g/dL Final  . HCT 04/30/2016 31.4* 39.0 - 52.0 % Final  . MCV 04/30/2016 74.1* 78.0 - 100.0 fL Final  . MCH 04/30/2016 22.6* 26.0 - 34.0 pg Final  . MCHC 04/30/2016 30.6  30.0 - 36.0 g/dL Final  . RDW 04/30/2016 16.6* 11.5 - 15.5 % Final  . Platelets 04/30/2016 345  150 - 400 K/uL Final  . Lactic Acid, Venous 04/29/2016 1.9  0.5 - 1.9 mmol/L Final  . Lactic Acid, Venous 04/29/2016 1.7  0.5 - 1.9 mmol/L Final  . MRSA by PCR 04/29/2016 NEGATIVE  NEGATIVE Final   Comment:        The GeneXpert MRSA Assay (FDA approved for NASAL specimens only), is one component of a comprehensive MRSA colonization surveillance program. It is not intended to diagnose MRSA infection nor to guide or monitor treatment for MRSA infections.   . Glucose-Capillary 04/29/2016 173* 65 - 99 mg/dL Final  . Comment 1 04/29/2016 Notify RN   Final  . Comment 2 04/29/2016 Document in Chart    Final  . Glucose-Capillary 04/30/2016 169* 65 - 99 mg/dL Final  . Magnesium 04/30/2016 1.8  1.7 - 2.4 mg/dL Final  . Color, Urine 04/30/2016 YELLOW  YELLOW Final  . APPearance 04/30/2016 HAZY* CLEAR Final  . Specific Gravity, Urine 04/30/2016 1.028  1.005 - 1.030 Final  . pH 04/30/2016 7.0  5.0 - 8.0 Final  . Glucose, UA 04/30/2016 NEGATIVE  NEGATIVE mg/dL Final  . Hgb urine dipstick 04/30/2016 NEGATIVE  NEGATIVE Final  . Bilirubin Urine 04/30/2016 NEGATIVE  NEGATIVE Final  . Ketones, ur 04/30/2016 NEGATIVE  NEGATIVE mg/dL Final  . Protein, ur 04/30/2016 NEGATIVE  NEGATIVE mg/dL Final  . Nitrite 04/30/2016 NEGATIVE  NEGATIVE Final  . Leukocytes, UA 04/30/2016 NEGATIVE  NEGATIVE Final  . Glucose-Capillary 04/30/2016 220* 65 - 99 mg/dL Final  . Procalcitonin 05/01/2016 <0.10  ng/mL  Final   Comment:        Interpretation: PCT (Procalcitonin) <= 0.5 ng/mL: Systemic infection (sepsis) is not likely. Local bacterial infection is possible. (NOTE)         ICU PCT Algorithm               Non ICU PCT Algorithm    ----------------------------     ------------------------------         PCT < 0.25 ng/mL                 PCT < 0.1 ng/mL     Stopping of antibiotics            Stopping of antibiotics       strongly encouraged.               strongly encouraged.    ----------------------------     ------------------------------       PCT level decrease by               PCT < 0.25 ng/mL       >= 80% from peak PCT       OR PCT 0.25 - 0.5 ng/mL          Stopping of antibiotics                                             encouraged.     Stopping of antibiotics           encouraged.    ----------------------------     ------------------------------       PCT level decrease by              PCT >= 0.25 ng/mL       < 80% from peak PCT        AND PCT >= 0.5 ng/mL            Continuin                          g antibiotics                                              encouraged.       Continuing  antibiotics            encouraged.    ----------------------------     ------------------------------     PCT level increase compared          PCT > 0.5 ng/mL         with peak PCT AND          PCT >= 0.5 ng/mL             Escalation of antibiotics                                          strongly encouraged.      Escalation of antibiotics        strongly encouraged.   . WBC 05/01/2016 12.7* 4.0 - 10.5 K/uL Final  . RBC 05/01/2016 4.22  4.22 - 5.81 MIL/uL Final  . Hemoglobin 05/01/2016  9.6* 13.0 - 17.0 g/dL Final  . HCT 05/01/2016 31.8* 39.0 - 52.0 % Final  . MCV 05/01/2016 75.4* 78.0 - 100.0 fL Final  . MCH 05/01/2016 22.7* 26.0 - 34.0 pg Final  . MCHC 05/01/2016 30.2  30.0 - 36.0 g/dL Final  . RDW 05/01/2016 16.8* 11.5 - 15.5 % Final  . Platelets 05/01/2016 318  150 - 400 K/uL Final  . Sodium 05/01/2016 140  135 - 145 mmol/L Final  . Potassium 05/01/2016 3.4* 3.5 - 5.1 mmol/L Final   Comment: DELTA CHECK NOTED NO VISIBLE HEMOLYSIS   . Chloride 05/01/2016 101  101 - 111 mmol/L Final  . CO2 05/01/2016 32  22 - 32 mmol/L Final  . Glucose, Bld 05/01/2016 131* 65 - 99 mg/dL Final  . BUN 05/01/2016 13  6 - 20 mg/dL Final  . Creatinine, Ser 05/01/2016 0.63  0.61 -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

## 2016-05-08 LAB — LIPID PANEL
CHOLESTEROL: 139 mg/dL (ref 0–200)
HDL: 31 mg/dL — AB (ref 35–70)
LDL CALC: 83 mg/dL
TRIGLYCERIDES: 125 mg/dL (ref 40–160)

## 2016-05-08 LAB — CBC AND DIFFERENTIAL
HEMATOCRIT: 35 % — AB (ref 41–53)
Hemoglobin: 10.6 g/dL — AB (ref 13.5–17.5)
Neutrophils Absolute: 8 /uL
Platelets: 329 10*3/uL (ref 150–399)
WBC: 10.7 10*3/mL

## 2016-05-25 NOTE — Progress Notes (Signed)
Location:   [facility]   Place of Service:  SNF (31)   CODE STATUS: dnr  Allergies  Allergen Reactions  . Diazepam Anxiety    REACTION: makes patient cry  . Vancomycin Other (See Comments)    "red man syndrome"    Chief Complaint  Patient presents with  . Medical Management of Chronic Issues    HPI:  He is a long term resident of this facility being seen for the management of his chronic illnesses. He has had a wound vac in place on his left lower extremity for an extended period of time without improvement of his wound. He does have pain due to the wound vac. He does not have any other complaints.    Past Medical History:  Diagnosis Date  . Adhesive capsulitis   . Angina   . Anxiety   . CAD (coronary artery disease)   . CHF (congestive heart failure) ([place])   . Chronic back pain   . Chronic kidney disease    hx of BPH  . COPD (chronic obstructive pulmonary disease) ([place])   . CVA (cerebral infarction) Questionable history  . Depression   . Diabetes mellitus   . Diverticulosis   . Falls frequently 06/2014  . GERD (gastroesophageal reflux disease)   . Hyperlipidemia   . Hypertension   . 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 Myocardial infarction   . Neuromuscular disorder ([place])    HX of diabetic periferal neuropathy  . On home oxygen therapy    "1.5L prn" (04/12/2014)  . Osteomyelitis ([place]) 2012   Sternoclavicular joint   . PE (pulmonary embolism) 10-15 years ago   Lifelong Coumadin  . Peripheral vascular disease ([place])   . Shortness of breath   . Sleep apnea     Past Surgical History:  Procedure Laterality Date  . AMPUTATION  03/16/2011   Procedure: AMPUTATION DIGIT;  Surgeon: Angelia Mould, MD;  [facility];  Service: Vascular;  Laterality: Left;  Great toe  . AMPUTATION Left 11/08/2015   Procedure: AMPUTATION ABOVE KNEE LEFT;  Surgeon: Waynetta Sandy, MD;  Location: [place];  Service:  Vascular;  Laterality: Left;  . CARDIAC CATHETERIZATION  October 18, 2010   Known obstructive disease, no further blockages  . CARDIAC CATHETERIZATION  03/17/2005   EF 35-40%  . COLONOSCOPY  2006    Multiple polyps removed, repeat in 5 years  . CORONARY ARTERY BYPASS GRAFT  1992  . [place], 2002  . DOPPLER ECHOCARDIOGRAPHY  Sept 2011   EF 50-55% with some impaired diastolic relaxation  . FEMORAL-POPLITEAL BYPASS GRAFT  2008   Left  . FRACTURE SURGERY  1980s   Hip  . HIP ARTHROPLASTY Left   . PERIPHERAL VASCULAR CATHETERIZATION N/A 11/06/2015   Procedure: Abdominal Aortogram;  Surgeon: Serafina Mitchell, MD;  Location: [place] CV LAB;  Service: Cardiovascular;  Laterality: N/A;  . PERIPHERAL VASCULAR CATHETERIZATION Bilateral 11/06/2015   Procedure: Lower Extremity Angiography;  Surgeon: Serafina Mitchell, MD;  Location: [place] CV LAB;  Service: Cardiovascular;  Laterality: Bilateral;  . Right iliopopliteal bypass - [place]  . septic arthritis     Removal of infected CABG wire by Dr Arlyce Dice - 2011  . SPINE SURGERY  2002   Cervical fusion vertebroplasty C3-4-5  . US ECHOCARDIOGRAPHY  03/04/2006   EF 50-55%    Social History   Social History  .  Marital status: Married    Spouse name: Butch Penny  . Number of children: 3  . Years of education: 12   Occupational History  . Retired-machinest/welder Retired   Social History Main Topics  . Smoking status: Former Smoker    Quit date: 06/19/2000  . Smokeless tobacco: Never Used  . Alcohol use No  . Drug use: No  . Sexual activity: No   Other Topics Concern  . Not on file   Social History Narrative   Health Care POA: TBD   Emergency Contact: wife, Butch Penny [phone]   End of Life Plan: pt does not have AD and not interested.   Who lives with you: Butch Penny and step-daughter-Betty   Any pets: none   Diet: Pt has a varied diet of protein, starch and vegetables.   Exercise: Pt does not have regular exercise  routine.   Seatbelts: Pt reports wearing seatbelt when in vehicles.    Hobbies: listening to car races.             Family History  Problem Relation Age of Onset  . Heart disease Father   . Heart disease Mother       VITAL SIGNS BP 137/60   Pulse 80   Temp 97.5 F (36.4 C)   Resp 20   Ht 5\' 6"  (1.676 m)   Wt 218 lb 9.6 oz (99.2 kg)   SpO2 96%   BMI 35.28 kg/m   Patient's Medications  New Prescriptions   No medications on file  Previous Medications   ACETAMINOPHEN (TYLENOL) 325 MG TABLET    Take 650 mg by mouth every 6 (six) hours as needed for fever.   AMINO ACIDS-PROTEIN HYDROLYS (FEEDING SUPPLEMENT, PRO-STAT SUGAR FREE 64,) LIQD    Take 30 mLs by mouth 2 (two) times daily.   ASPIRIN EC 81 MG EC TABLET    Take 1 tablet (81 mg total) by mouth daily.   ATORVASTATIN (LIPITOR) 20 MG TABLET    Take 20 mg by mouth at bedtime.    BISACODYL (DULCOLAX) 10 MG SUPPOSITORY    Place 1 suppository (10 mg total) rectally daily as needed for moderate constipation.   CARVEDILOL (COREG) 3.125 MG TABLET    Take 3.125 mg by mouth at bedtime.   CETIRIZINE (ZYRTEC ALLERGY) 10 MG TABLET    Take 10 mg by mouth daily with breakfast.    DOCUSATE SODIUM (COLACE) 100 MG CAPSULE    Take 100 mg by mouth 2 (two) times daily.   DULOXETINE (CYMBALTA) 30 MG CAPSULE    Take 90 mg by mouth daily with breakfast.    FERROUS SULFATE 325 (65 FE) MG TABLET    Take 1 tablet (325 mg total) by mouth daily with breakfast.   FUROSEMIDE (LASIX) 80 MG TABLET    Take 1 tablet (80 mg total) by mouth 2 (two) times daily.   GABAPENTIN (NEURONTIN) 600 MG TABLET    Take 600 mg by mouth 3 (three) times daily.    GUAIFENESIN-DEXTROMETHORPHAN (ROBITUSSIN DM) 100-10 MG/5ML SYRUP    Take 15 mLs by mouth every 4 (four) hours as needed for cough.   HYDROCODONE-ACETAMINOPHEN (NORCO/VICODIN) 5-325 MG TABLET    Take 1 tablet by mouth every 4 (four) hours as needed for moderate pain.   INSULIN LISPRO (HUMALOG) 100 UNIT/ML CARTRIDGE     Inject 0-13 Units into the skin 3 (three) times daily with meals. Inject as per sliding scale: if  0-70=0 units; 71-149 = 8 units, 150-600= 13 units  subcutaneously before meals related to DM.   INSULIN NPH HUMAN (HUMULIN N,NOVOLIN N) 100 UNIT/ML INJECTION    Inject 0.2 mLs (20 Units total) into the skin 2 (two) times daily before a meal.   LIDOCAINE (LIDODERM) 5 %    Place 1 patch onto the skin daily. *Apply 0.5 a patch to each shoulder every morning*   LORAZEPAM (ATIVAN) 0.5 MG TABLET    Take 1 tablet (0.5 mg total) by mouth 2 (two) times daily as needed for anxiety.   LUBIPROSTONE (AMITIZA) 8 MCG CAPSULE    Take 8 mcg by mouth 2 (two) times daily with a meal.   METHOCARBAMOL (ROBAXIN) 500 MG TABLET    Take 1 tablet (500 mg total) by mouth 3 (three) times daily.   MULTIPLE VITAMINS-MINERALS (DECUBI-VITE PO)    Take 1 capsule by mouth daily with breakfast.    NITROGLYCERIN (NITROSTAT) 0.4 MG SL TABLET    Place 1 tablet (0.4 mg total) under the tongue as needed. For chest pains   OMEPRAZOLE (PRILOSEC) 40 MG CAPSULE    Take 1 capsule (40 mg total) by mouth daily.   OXYGEN    Inhale 2 L into the lungs continuous.    POLYETHYLENE GLYCOL (MIRALAX / GLYCOLAX) PACKET    Take 17 g by mouth daily with breakfast.    POTASSIUM CHLORIDE SA (K-DUR,KLOR-CON) 20 MEQ TABLET    Take 20 mEq by mouth daily with breakfast.    PROPYLENE GLYCOL (SYSTANE BALANCE) 0.6 % SOLN    Place 1 drop into both eyes 2 (two) times daily.    RIVAROXABAN (XARELTO) 20 MG TABS TABLET    Take 20 mg by mouth daily with supper.   SENNA-DOCUSATE (SENOKOT-S) 8.6-50 MG TABLET    Take 1 tablet by mouth 2 (two) times daily.   SKIN PROTECTANTS, MISC. (CALAZIME SKIN PROTECTANT EX)    Apply 1 application topically daily. Apply to sacrum and right buttock every day   TAMSULOSIN (FLOMAX) 0.4 MG CAPS CAPSULE    Take 1 capsule (0.4 mg total) by mouth daily after supper.   TRAZODONE (DESYREL) 100 MG TABLET    Take 100 mg by mouth at bedtime.    VITAMIN  D, ERGOCALCIFEROL, (DRISDOL) 50000 UNITS CAPS CAPSULE    Take 1 capsule (50,000 Units total) by mouth every 7 (seven) days.   VITAMINS A & D (VITAMIN A & D) OINTMENT    Apply 1 application topically 2 (two) times daily as needed for dry skin.  Modified Medications   No medications on file  Discontinued Medications   No medications on file     SIGNIFICANT DIAGNOSTIC EXAMS   10-22-15: 2-d echo: - Left ventricle: The cavity size was mildly dilated. Wall thickness was increased in a pattern of mild LVH. Systolic function was moderately to severely reduced. The estimated ejection fraction was in the range of 30% to 35%. Difficult images even with Definity. There appears to be severe hypokinesis of the inferior and inferolateral walls. Features are consistent with a pseudonormal left ventricular filling pattern, with concomitant abnormal relaxation and increased filling pressure  (grade 2 diastolic dysfunction). - Aortic valve: Trileaflet; moderately calcified leaflets. There was no stenosis. - Mitral valve: Moderately calcified annulus. Mildly calcified leaflets . There was moderate regurgitation. - Left atrium: The atrium was mildly dilated. - Right ventricle: Poorly visualized. The cavity size was normal. Wall thickness was normal. - Tricuspid valve: Peak RV-RA gradient (S): 24 mm Hg. - Pulmonary arteries: PA peak pressure: 39 mm  Hg (S). - Systemic veins: IVC measured 2.6 cm with < 50% respirophasic  variation, suggesting RA pressure 15 mmHg. Impressions: - Mildly dilated LV with mild LV hypertrophy. Difficult images even with Definity, but EF appears to be 30-35% with severe hypokinesis of the inferior and inferolateral walls. Moderate diastolic dysfunction. The RV was poorly visualized, probably mildly decreased systolic function. Moderate mitral regurgitation. Dilated IVC with mild pulmonary hypertension.  10-31-15: ct of head: 1. No acute finding or change compared to prior. 2. Remote left MCA  territory infarction. Remote small bilateral cerebellar infarcts.  01-24-16: chest x-ray: No acute process or explanation for sepsis Cardiomegaly with left hemidiaphragm elevation and adjacent volume loss/atelectasis.  01-24-16: right tibia/fibula x-ray: 1. No evidence of fracture or dislocation. 2. Mild degenerative change at the medial compartment of the right knee. 3. Diffuse vascular calcifications seen.  01-26-16: right foot x-ray: No acute finding or focal bone loss by plain radiography.  02-13-16: left shoulder x-ray: a lucent line at the distal clavicle   02-15-16: left shoulder x-ray: recent left mid clavicle fracture without visible callus  LABS REVIEWED:   02-23-15: tsh 4.032; vit d 11  03-08-15: wbc 7.7; hgb 11.5; hct 35.7; mcv 79.7; plt 156; glucose 115; bun 19; creat 0.96; k+ 4.6; na++146; liver normal albumin 3.4 INR 1.66  04-04-15: glucose 136; bun 19; creat 1.02; k+ 3.7; na++142 06-04-15: INR 2.77  06-18-15: INR 2.41  Urine for micro-albumin 0.7  06-25-15: wbc 8.0; hgb 12.1; hct 37.9 ;mcv 78.6; plt 159; glucose 122; bun 20; creat 0.93; k+ 4.3; na++142 liver normal albumin 3.6; chol 151; ldl 87; trig 162; hdl 32; hgb a1c 7.4 07-26-15: wbc 7.7; hgb 12.;3 hct 37.2; mcv 77.3; plt 106; glucose 161; bun 37; creat 1.75; k+ 4.3; na++136  07-30-15: wbc 12.8; hgb 10.7; hct 3.2; mcv 77.4; plt 174; g glucose 120; bun 43; creat 1.22; k+ 4.3; na++134 inr >10 (question reading of inr)  12-19-15: glucose 217; bun 13; creat 0.74; k+ 3.0; na++ 142; liver normal albumin 3.3; hgb a1c 5.7 12-26-15: glucose 103; bun 15; creat 0.73; k+ 3.2; na++ 142; liver normal albumin 3.2  01-04-16: k+ 3.5  01-14-16: glucose 131; bun 10.7; creat 0.62; k+ 3.8; na++ 143; liver normal albumin 3.5 hgb a1c 5.9  01-24-16: wbc 35.1; hgb 11.5; hct 36.2; mcv 75.9; plt 288; glucose 181; bun 17; creat 0.99; k+ 2.9; na++ 131; liver normal albumin 2.7; urine culture: no growth 01-26-16: wbc 31.6; hgb 11.5; hct 37.4; mcv 77.4; plt 313;  glucose 155; bun 13; creat 0.64; k+ 4.5 ;na++ 137; mag 2.3; phos 3.3  01-28-16: wbc 11.7 ;hgb 10.5; hct 33.5 ;mcv 75.5; plt 280; glucose 193; bun 12; creat 0.73; k+ 3.8; na++ 138; phos 2.9 01-29-16: wbc 12.5; hgb 10.4; hct 34.5; mcv 76.8; pt 292  01-31-16: wbc 16.6; hgb 11.8; hct 39.7; mcv 79.7; plt 325;  02-01-16: glucose 161; bun 16.3; creat 0.56; k+ 3.6; na++ 143; liver normal albumin 3.3  12 -6-17: vit B 12: 337; mag 1.8     Review of Systems Constitutional: does have fatigue   Respiratory: no cough or shortness of breath  Cardiovascular: Negative for chest pain, palpitations and leg swelling.  Gastrointestinal: Negative for heartburn, abdominal pain and constipation.  Musculoskeletal: chronic back pain Skin: drainage on incision line    Neurological: Negative for headaches.  Psychiatric/Behavioral: . The patient is not nervous/anxious    Physical Exam Constitutional: He is oriented to person, place, and time. No distress.  Obese  Neck: Neck supple. No JVD present. No thyromegaly present.  Cardiovascular: Normal rate, regular rhythm and intact distal pulses.   Respiratory: Effort normal and breath sounds normal. No respiratory distress.  GI: Soft. Bowel sounds are normal. He exhibits no distension. There is no tenderness.  Musculoskeletal:  Is status post left aka  Is able to move all extremities 1+ right lower extremity edema   Neurological: He is alert and oriented to person, place, and time.  Skin: Skin is warm and dry. He is not diaphoretic.  left thigh: lateral with slough present: 2.1 x 1.2 x 0.12 cm will use santyl and calcium alginate Left proximal thigh 3.2 x 2.2 x 0.12cm will use calcium alginate and hydrogel Psychiatric: He has a normal mood and affect.    ASSESSMENT/ PLAN:   1. Chronic systolic heart failure: EF 30-35% (10-22-15): will continue lasix 80 mg twice daily with k+ 20 meq  daily coreg 3.125 mg twice daily   2. Hypertension: will continue coreg  3.125 mg twice daily   3. CAD: status post CABG and stents: will continue asa 81 mg daily and ntg prn  4. Chronic PE: is stable will continue xarelto 20 mg daily  5. Dyslipidemia: ldl 87 will continue liptor 20 mg daily   6. Diabetes: hgb a1c 5.7; will continue NPH 20 units twice daily and humalog 8 units with meals with an additional 3 units for cbg >150  7. PVD: will continue asa 81 mg is status post left aka  Will continue vicodin 7.5/325 mg every 4 hours as needed   8. Anemia: hgb 10.4 will continue iron daily   9. COPD: is on chronic 02 therapy;   10. Constipation: will continue colace twice daily senna s twice daily miralax daily and  amitiza 8 mcg twice daily   11. Peripheral neuropathy: will continue cymbalta 90 mg daily neurontin 600 mg three times daily robaxin 500 mg three times daily for spasms  12. Anxiety with depression: will continue cymbalta 90 mg daily also takes for pain; takes trazodone 100 mg nightly will continue ativan 0.5 mg twice daily as needed  13. Gerd: will continue prilosec 40 mg daily  14. Bilateral shoulder pain: will continue lidoderm patch; will continue vicodin 7.5/325 mg every 4 hours as needed   15. Vit D deficiency: will continue vitamin d 50,000 units weekly   16. BPH: will continue flomax 0.4 mg daily    17. Left thigh wounds: will change his treatment and will have him follow up with the wound doctor   Will check hgb a1c and lipids    MD is aware of resident's narcotic use and is in agreement with current plan of care. We will attempt to wean resident as apropriate   Ok Edwards NP [facility]  Contact [phone] Monday through Friday 8am- 5pm  After hours call [phone]

## 2016-06-04 NOTE — Progress Notes (Signed)
Location:    Cabin crew of Service:  SNF (31)   CODE STATUS: dnr  Allergies  Allergen Reactions  . Diazepam Anxiety    REACTION: makes patient cry  . Vancomycin Other (See Comments)    "red man syndrome"   Chief Complaint  Patient presents with  . Hospitalization Follow-up    HPI:  He is a long term resident of this facility who has been hospitalized for sirs due to cellulitis of bilateral thighs and flanks. 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 He tells me that he is feeling better is happy to be back home.    Past Medical History:  Diagnosis Date  . Adhesive capsulitis   . Angina   . Anxiety   . CAD (coronary artery disease)   . CHF (congestive heart failure) ([place])   . Chronic back pain   . Chronic kidney disease    hx of BPH  . COPD (chronic obstructive pulmonary disease) ([place])   . CVA (cerebral infarction) Questionable history  . Depression   . Diabetes mellitus   . Diverticulosis   . Falls frequently 06/2014  . GERD (gastroesophageal reflux disease)   . Hyperlipidemia   . Hypertension   . 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 Myocardial infarction   . Neuromuscular disorder ([place])    HX of diabetic periferal neuropathy  . On home oxygen therapy    "1.5L prn" (04/12/2014)  . Osteomyelitis ([employer]) 2012   Sternoclavicular joint   . PE (pulmonary embolism) 10-15 years ago   Lifelong Coumadin  . Peripheral vascular disease ([place])   . Shortness of breath   . Sleep apnea     Past Surgical History:  Procedure Laterality Date  . AMPUTATION  03/16/2011   Procedure: AMPUTATION DIGIT;  Surgeon: Angelia Mould, MD;  Location: [facility];  Service: Vascular;  Laterality: Left;  Great toe  . AMPUTATION Left 11/08/2015   Procedure: AMPUTATION ABOVE KNEE LEFT;  Surgeon:  Waynetta Sandy, MD;  Location: [place];  Service: Vascular;  Laterality: Left;  . CARDIAC CATHETERIZATION  October 18, 2010   Known obstructive disease, no further blockages  . CARDIAC CATHETERIZATION  03/17/2005   EF 35-40%  . COLONOSCOPY  2006    Multiple polyps removed, repeat in 5 years  . CORONARY ARTERY BYPASS GRAFT  1992  . [place], 2002  . DOPPLER ECHOCARDIOGRAPHY  Sept 2011   EF 50-55% with some impaired diastolic relaxation  . FEMORAL-POPLITEAL BYPASS GRAFT  2008   Left  . FRACTURE SURGERY  1980s   Hip  . HIP ARTHROPLASTY Left   . PERIPHERAL VASCULAR CATHETERIZATION N/A 11/06/2015   Procedure: Abdominal Aortogram;  Surgeon: Serafina Mitchell, MD;  Location: [facility];  Service: Cardiovascular;  Laterality: N/A;  . PERIPHERAL VASCULAR CATHETERIZATION Bilateral 11/06/2015   Procedure: Lower Extremity Angiography;  Surgeon: Serafina Mitchell, MD;  Location: [facility];  Service: Cardiovascular;  Laterality: Bilateral;  . Right iliopopliteal bypass - [place]  . septic arthritis     Removal of infected CABG wire by Dr Arlyce Dice - 2011  . SPINE SURGERY  2002   Cervical fusion vertebroplasty C3-4-5  . US ECHOCARDIOGRAPHY  03/04/2006   EF 50-55%  Social History   Social History  . Marital status: Married    Spouse name: Butch Penny  . Number of children: 3  . Years of education: 12   Occupational History  . Retired-machinest/welder Retired   Social History Main Topics  . Smoking status: Former Smoker    Quit date: 06/19/2000  . Smokeless tobacco: Never Used  . Alcohol use No  . Drug use: No  . Sexual activity: No   Other Topics Concern  . Not on file   Social History Narrative   Health Care POA: TBD   Emergency Contact: wife, Butch Penny [phone]   End of Life Plan: pt does not have AD and not interested.   Who lives with you: Butch Penny and step-daughter-Betty   Any pets: none   Diet: Pt has a varied diet of protein, starch and  vegetables.   Exercise: Pt does not have regular exercise routine.   Seatbelts: Pt reports wearing seatbelt when in vehicles.    Hobbies: listening to car races.             Family History  Problem Relation Age of Onset  . Heart disease Father   . Heart disease Mother       VITAL SIGNS BP 140/63   Pulse 70   Temp 99.1 F (37.3 C)   Resp 20   Ht '5\' 8"'  (1.727 m)   Wt 217 lb 12.8 oz (98.8 kg)   SpO2 96%   BMI 33.12 kg/m   Patient's Medications  New Prescriptions   No medications on file  Previous Medications   ACETAMINOPHEN (TYLENOL) 325 MG TABLET    Take 650 mg by mouth every 6 (six) hours as needed for fever.   AMINO ACIDS-PROTEIN HYDROLYS (FEEDING SUPPLEMENT, PRO-STAT SUGAR FREE 64,) LIQD    Take 30 mLs by mouth 2 (two) times daily.   ASPIRIN EC 81 MG EC TABLET    Take 1 tablet (81 mg total) by mouth daily.   ATORVASTATIN (LIPITOR) 20 MG TABLET    Take 20 mg by mouth at bedtime.    BISACODYL (DULCOLAX) 10 MG SUPPOSITORY    Place 1 suppository (10 mg total) rectally daily as needed for moderate constipation.   CARVEDILOL (COREG) 3.125 MG TABLET    Take 3.125 mg by mouth at bedtime.   CETIRIZINE (ZYRTEC ALLERGY) 10 MG TABLET    Take 10 mg by mouth daily with breakfast.    DOCUSATE SODIUM (COLACE) 100 MG CAPSULE    Take 100 mg by mouth 2 (two) times daily.   DULOXETINE (CYMBALTA) 30 MG CAPSULE    Take 90 mg by mouth daily with breakfast.    FERROUS SULFATE 325 (65 FE) MG TABLET    Take 1 tablet (325 mg total) by mouth daily with breakfast.   FUROSEMIDE (LASIX) 80 MG TABLET    Take 1 tablet (80 mg total) by mouth 2 (two) times daily.   GABAPENTIN (NEURONTIN) 600 MG TABLET    Take 600 mg by mouth 3 (three) times daily.    GUAIFENESIN-DEXTROMETHORPHAN (ROBITUSSIN DM) 100-10 MG/5ML SYRUP    Take 15 mLs by mouth every 4 (four) hours as needed for cough.   HYDROCODONE-ACETAMINOPHEN (NORCO/VICODIN) 5-325 MG TABLET    Take 1 tablet by mouth every 4 (four) hours as needed for  moderate pain.   INSULIN LISPRO (HUMALOG) 100 UNIT/ML CARTRIDGE    Inject 0-13 Units into the skin 3 (three) times daily with meals. Inject as per sliding scale: if  0-70=0  units; 71-149 = 8 units, 150-600= 13 units subcutaneously before meals related to DM.   INSULIN NPH HUMAN (HUMULIN N,NOVOLIN N) 100 UNIT/ML INJECTION    Inject 0.2 mLs (20 Units total) into the skin 2 (two) times daily before a meal.   LIDOCAINE (LIDODERM) 5 %    Place 1 patch onto the skin daily. *Apply 0.5 a patch to each shoulder every morning*   LORAZEPAM (ATIVAN) 0.5 MG TABLET    Take 1 tablet (0.5 mg total) by mouth 2 (two) times daily as needed for anxiety.   LUBIPROSTONE (AMITIZA) 8 MCG CAPSULE    Take 8 mcg by mouth 2 (two) times daily with a meal.   METHOCARBAMOL (ROBAXIN) 500 MG TABLET    Take 1 tablet (500 mg total) by mouth 3 (three) times daily.   MULTIPLE VITAMINS-MINERALS (DECUBI-VITE PO)    Take 1 capsule by mouth daily with breakfast.    NITROGLYCERIN (NITROSTAT) 0.4 MG SL TABLET    Place 1 tablet (0.4 mg total) under the tongue as needed. For chest pains   OMEPRAZOLE (PRILOSEC) 40 MG CAPSULE    Take 1 capsule (40 mg total) by mouth daily.   OXYGEN    Inhale 2 L into the lungs continuous.    POLYETHYLENE GLYCOL (MIRALAX / GLYCOLAX) PACKET    Take 17 g by mouth daily with breakfast.    POTASSIUM CHLORIDE SA (K-DUR,KLOR-CON) 20 MEQ TABLET    Take 20 mEq by mouth daily with breakfast.    PROPYLENE GLYCOL (SYSTANE BALANCE) 0.6 % SOLN    Place 1 drop into both eyes 2 (two) times daily.    RIVAROXABAN (XARELTO) 20 MG TABS TABLET    Take 20 mg by mouth daily with supper.   SENNA-DOCUSATE (SENOKOT-S) 8.6-50 MG TABLET    Take 1 tablet by mouth 2 (two) times daily.   SKIN PROTECTANTS, MISC. (CALAZIME SKIN PROTECTANT EX)    Apply 1 application topically daily. Apply to sacrum and right buttock every day   TAMSULOSIN (FLOMAX) 0.4 MG CAPS CAPSULE    Take 1 capsule (0.4 mg total) by mouth daily after supper.   TRAZODONE  (DESYREL) 100 MG TABLET    Take 100 mg by mouth at bedtime.    VITAMIN D, ERGOCALCIFEROL, (DRISDOL) 50000 UNITS CAPS CAPSULE    Take 1 capsule (50,000 Units total) by mouth every 7 (seven) days.   VITAMINS A & D (VITAMIN A & D) OINTMENT    Apply 1 application topically 2 (two) times daily as needed for dry skin.  Modified Medications   No medications on file  Discontinued Medications   No medications on file     SIGNIFICANT DIAGNOSTIC EXAMS   10-22-15: 2-d echo: - Left ventricle: The cavity size was mildly dilated. Wall thickness was increased in a pattern of mild LVH. Systolic function was moderately to severely reduced. The estimated ejection fraction was in the range of 30% to 35%. Difficult images even with Definity. There appears to be severe hypokinesis of the inferior and inferolateral walls. Features are consistent with a pseudonormal left ventricular filling pattern, with concomitant abnormal relaxation and increased filling pressure  (grade 2 diastolic dysfunction). - Aortic valve: Trileaflet; moderately calcified leaflets. There was no stenosis. - Mitral valve: Moderately calcified annulus. Mildly calcified leaflets . There was moderate regurgitation. - Left atrium: The atrium was mildly dilated. - Right ventricle: Poorly visualized. The cavity size was normal. Wall thickness was normal. - Tricuspid valve: Peak RV-RA gradient (S): 24 mm Hg. -  Pulmonary arteries: PA peak pressure: 39 mm Hg (S). - Systemic veins: IVC measured 2.6 cm with < 50% respirophasic  variation, suggesting RA pressure 15 mmHg. Impressions: - Mildly dilated LV with mild LV hypertrophy. Difficult images even with Definity, but EF appears to be 30-35% with severe hypokinesis of the inferior and inferolateral walls. Moderate diastolic dysfunction. The RV was poorly visualized, probably mildly decreased systolic function. Moderate mitral regurgitation. Dilated IVC with mild pulmonary hypertension.  10-31-15: ct of  head: 1. No acute finding or change compared to prior. 2. Remote left MCA territory infarction. Remote small bilateral cerebellar infarcts.  01-24-16: chest x-ray: No acute process or explanation for sepsis Cardiomegaly with left hemidiaphragm elevation and adjacent volume loss/atelectasis.  01-24-16: right tibia/fibula x-ray: 1. No evidence of fracture or dislocation. 2. Mild degenerative change at the medial compartment of the right knee. 3. Diffuse vascular calcifications seen.  01-26-16: right foot x-ray: No acute finding or focal bone loss by plain radiography.  02-13-16: left shoulder x-ray: a lucent line at the distal clavicle   02-15-16: left shoulder x-ray: recent left mid clavicle fracture without visible callus  04-29-16: chest x-ray; No edema or consolidation. Heart borderline enlarged.  04-29-16: ct of abdomen and pelvis: No acute intra-abdominal or intrapelvic abnormalities. 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 Due to mild interval growth and increased mass effect upon the RIGHT kidney and the RIGHT colon, and the fact that angiomyolipomas over 4 cm have an increased risk for spontaneous hemorrhage, consider surgical consultation for potential excision. Aortic atherosclerosis and coronary arterial calcification  No definite acute intra-abdominal or intrapelvic abnormalities.     LABS REVIEWED:   06-04-15: INR 2.77  06-18-15: INR 2.41  Urine for micro-albumin 0.7  06-25-15: wbc 8.0; hgb 12.1; hct 37.9 ;mcv 78.6; plt 159; glucose 122; bun 20; creat 0.93; k+ 4.3; na++142 liver normal albumin 3.6; chol 151; ldl 87; trig 162; hdl 32; hgb a1c 7.4 07-26-15: wbc 7.7; hgb 12.;3 hct 37.2; mcv 77.3; plt 106; glucose 161; bun 37; creat 1.75; k+ 4.3; na++136  07-30-15: wbc 12.8; hgb 10.7; hct 3.2; mcv 77.4; plt 174; g glucose 120; bun 43; creat 1.22;  k+ 4.3; na++134 inr >10 (question reading of inr)  12-19-15: glucose 217; bun 13; creat 0.74; k+ 3.0; na++ 142; liver normal albumin 3.3; hgb a1c 5.7 12-26-15: glucose 103; bun 15; creat 0.73; k+ 3.2; na++ 142; liver normal albumin 3.2  01-04-16: k+ 3.5  01-14-16: glucose 131; bun 10.7; creat 0.62; k+ 3.8; na++ 143; liver normal albumin 3.5 hgb a1c 5.9  01-24-16: wbc 35.1; hgb 11.5; hct 36.2; mcv 75.9; plt 288; glucose 181; bun 17; creat 0.99; k+ 2.9; na++ 131; liver normal albumin 2.7; urine culture: no growth 01-26-16: wbc 31.6; hgb 11.5; hct 37.4; mcv 77.4; plt 313; glucose 155; bun 13; creat 0.64; k+ 4.5 ;na++ 137; mag 2.3; phos 3.3  01-28-16: wbc 11.7 ;hgb 10.5; hct 33.5 ;mcv 75.5; plt 280; glucose 193; bun 12; creat 0.73; k+ 3.8; na++ 138; phos 2.9 01-29-16: wbc 12.5; hgb 10.4; hct 34.5; mcv 76.8; pt 292  01-31-16: wbc 16.6; hgb 11.8; hct 39.7; mcv 79.7; plt 325;  02-01-16: glucose 161; bun 16.3; creat 0.56; k+ 3.6; na++ 143; liver normal albumin 3.3  12 -6-17: vit B 12: 337; mag 1.8  04-29-16: wbc 24.4; hgb 10.7; hct  34.6 ;mcv 73.8; plt 401; glucose 131; bun 16; creat 0.66; k+ 3.1; na++ 135; ast 60; alt 64; alk phos 164; albumin 2.8; ammonia 38 04-30-16: wbc 20.3; hgb 9.6; hgb 31.4; mcv 74.1; plt 345; glucose 146; bun 13; creat 0.67; k+ 2.8; na++ 138; mag 1.8 05-01-16: wbc 12.7; hgb 9.6; hct 31.8; mcv 75.4; plt 318; glucose 131; bun 13; creat 0.63; k+ 3.4 ;na++ 140    Review of Systems Constitutional: does have fatigue   Respiratory: no cough or shortness of breath  Cardiovascular: Negative for chest pain, palpitations and leg swelling.  Gastrointestinal: Negative for heartburn, abdominal pain and constipation.  Musculoskeletal: chronic back pain Skin: has skin ulcers    Neurological: Negative for headaches.  Psychiatric/Behavioral: . The patient is not nervous/anxious    Physical Exam Constitutional: He is oriented to person, place, and time. No distress.  Obese   Neck: Neck supple. No JVD  present. No thyromegaly present.  Cardiovascular: Normal rate, regular rhythm and intact distal pulses.   Respiratory: Effort normal and breath sounds normal. No respiratory distress.  GI: Soft. Bowel sounds are normal. He exhibits no distension. There is no tenderness.  Musculoskeletal:  Is status post left aka  Is able to move all extremities 1+ right lower extremity edema   Neurological: He is alert and oriented to person, place, and time.  Skin: Skin is warm and dry. He is not diaphoretic.  right heel: 2.0 x 1.2 x 0.12 cm: santyl and calcium alginate Left distal thigh: 2.1 x 1.2 x 0.12 cm santyl and calcium alginate Left proximal thigh: 3.2 x 2.2 x 0.12 cm hydrogel; santyl and calcium alginate Psychiatric: He has a normal mood and affect.    ASSESSMENT/ PLAN:   1. Chronic systolic heart failure: EF 30-35% (10-22-15): will continue lasix 80 mg twice daily with k+ 20 meq  daily coreg 3.125 mg nightly   2. Hypertension: will continue coreg 3.125 mg nightly   3. CAD: status post CABG and stents: will continue asa 81 mg daily and ntg prn  4. Chronic PE: is stable will continue xarelto 20 mg daily  5. Dyslipidemia: ldl 87 will continue liptor 20 mg daily   6. Diabetes: hgb a1c 5.7; will continue NPH 20 units twice daily and humalog 8 units with meals with an additional 3 units for cbg >150  7. PVD: will continue asa 81 mg is status post left aka  Will continue vicodin 5/325 mg every 4 hours as needed   8. Anemia: hgb 9.6 will continue iron daily   9. COPD: is on chronic 02 therapy; takes zyrtec 10 mg daily   10. Constipation: will continue colace twice daily senna s twice daily miralax daily and  amitiza 8 mcg twice daily   11. Peripheral neuropathy: will continue cymbalta 90 mg daily neurontin 600 mg three times daily robaxin 500 mg three times daily for spasms  12. Anxiety with depression: will continue cymbalta 90 mg daily also takes for pain; takes trazodone 100 mg nightly  will continue ativan 0.5 mg twice daily as needed  13. Gerd: will continue prilosec 40 mg daily  14. Bilateral shoulder pain: will continue lidoderm patch; will continue vicodin 5/325 mg every 4 hours as needed   15. Vit D deficiency: will continue vitamin d 50,000 units weekly   16. BPH: will continue flomax 0.4 mg daily    17. Left thigh wounds: have him follow up with the wound doctor   Time spent with patient  50  minutes >50% time spent counseling; reviewing medical record; tests; labs; and developing future plan of care   MD is aware of resident's narcotic use and is in agreement with current plan of care. We will attempt to wean resident as apropriate   Ok Edwards NP [facility]  Contact [phone] Monday through Friday 8am- 5pm  After hours call [phone]

## 2016-06-17 ENCOUNTER — Encounter: Payer: Self-pay | Admitting: Vascular Surgery

## 2016-06-27 ENCOUNTER — Ambulatory Visit (INDEPENDENT_AMBULATORY_CARE_PROVIDER_SITE_OTHER): Payer: Medicare Other | Admitting: Vascular Surgery

## 2016-06-27 ENCOUNTER — Encounter: Payer: Self-pay | Admitting: Vascular Surgery

## 2016-06-27 VITALS — BP 104/62 | HR 92 | Temp 97.8°F | Resp 20 | Ht 68.0 in | Wt 220.0 lb

## 2016-06-27 DIAGNOSIS — I779 Disorder of arteries and arterioles, unspecified: Secondary | ICD-10-CM | POA: Diagnosis not present

## 2016-06-27 NOTE — Progress Notes (Signed)
Subjective:     Patient ID: Park Breed, male   DOB: 1941/02/04, 76 y.o.   MRN: [id number]  HPI Mr. Ledet follows up for wound of his left above-knee amputation site as well as right heel ulceration. He states that he is getting good wound care at [address] [place]. He is not having fevers he is eating well and he is generally doing well at this time.   Review of Systems Wounds of left aka and right heel    Objective:   Physical Exam aaox3 Non labored respirations Left aka wound with 2 areas of granulation, dressing replaced Right heel with small <1cm area of stable ulceration on lateral aspect R peroneal signal    Assessment/plan     76 year old male follows up for wound check of his left above-knee amputation site. Celexa progressing quite well and is nearly healed. 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 He also needs good wound care to this which he is apparently getting. As her no further vascular procedures he needs he can follow-up on a when necessary basis and I am happy to see him anytime.   Miu Chiong C. Donzetta Matters, MD [facility] Office: [phone] Pager: [phone]

## 2016-07-23 ENCOUNTER — Other Ambulatory Visit: Payer: Self-pay | Admitting: Family Medicine

## 2016-07-29 ENCOUNTER — Other Ambulatory Visit (HOSPITAL_COMMUNITY): Payer: Self-pay | Admitting: Family Medicine

## 2016-07-29 DIAGNOSIS — M869 Osteomyelitis, unspecified: Secondary | ICD-10-CM

## 2016-08-01 ENCOUNTER — Other Ambulatory Visit: Payer: Self-pay

## 2016-08-01 MED ORDER — TRAMADOL HCL 50 MG PO TABS: 50.0000 mg | ORAL_TABLET | Freq: Two times a day (BID) | ORAL | 5 refills | Status: DC

## 2016-08-01 NOTE — Telephone Encounter (Signed)
RX faxed to [facility] fax # [phone] Phone #[phone] 

## 2016-08-06 ENCOUNTER — Ambulatory Visit (HOSPITAL_COMMUNITY)
Admission: RE | Admit: 2016-08-06 | Discharge: 2016-08-06 | Disposition: A | Discharge status: Home / Self Care | Payer: Medicare Other | Source: Ambulatory Visit | Attending: Family Medicine | Admitting: Family Medicine

## 2016-08-06 DIAGNOSIS — M722 Plantar fascial fibromatosis: Secondary | ICD-10-CM | POA: Diagnosis not present

## 2016-08-06 DIAGNOSIS — M869 Osteomyelitis, unspecified: Secondary | ICD-10-CM

## 2016-08-06 DIAGNOSIS — E1169 Type 2 diabetes mellitus with other specified complication: Secondary | ICD-10-CM | POA: Insufficient documentation

## 2016-08-06 DIAGNOSIS — M659 Synovitis and tenosynovitis, unspecified: Secondary | ICD-10-CM | POA: Insufficient documentation

## 2016-08-06 LAB — POCT I-STAT CREATININE: Creatinine, Ser: 0.8 mg/dL (ref 0.61–1.24)

## 2016-08-06 MED ORDER — GADOBENATE DIMEGLUMINE 529 MG/ML IV SOLN
20.0000 mL | Freq: Once | INTRAVENOUS | Status: AC
  Administered 2016-08-06: 20 mL via INTRAVENOUS

## 2016-08-19 ENCOUNTER — Encounter: Payer: Self-pay | Admitting: Internal Medicine

## 2016-08-19 ENCOUNTER — Non-Acute Institutional Stay (SKILLED_NURSING_FACILITY): Payer: Medicare Other | Admitting: Internal Medicine

## 2016-08-19 DIAGNOSIS — A4902 Methicillin resistant Staphylococcus aureus infection, unspecified site: Secondary | ICD-10-CM | POA: Diagnosis not present

## 2016-08-19 DIAGNOSIS — E11621 Type 2 diabetes mellitus with foot ulcer: Secondary | ICD-10-CM

## 2016-08-19 DIAGNOSIS — I10 Essential (primary) hypertension: Secondary | ICD-10-CM

## 2016-08-19 DIAGNOSIS — L97409 Non-pressure chronic ulcer of unspecified heel and midfoot with unspecified severity: Secondary | ICD-10-CM | POA: Diagnosis not present

## 2016-08-19 DIAGNOSIS — J431 Panlobular emphysema: Secondary | ICD-10-CM | POA: Diagnosis not present

## 2016-08-19 DIAGNOSIS — I5022 Chronic systolic (congestive) heart failure: Secondary | ICD-10-CM | POA: Diagnosis not present

## 2016-08-19 DIAGNOSIS — I739 Peripheral vascular disease, unspecified: Secondary | ICD-10-CM | POA: Diagnosis not present

## 2016-08-19 DIAGNOSIS — N2889 Other specified disorders of kidney and ureter: Secondary | ICD-10-CM | POA: Diagnosis not present

## 2016-08-19 DIAGNOSIS — Z89612 Acquired absence of left leg above knee: Secondary | ICD-10-CM

## 2016-08-19 NOTE — Progress Notes (Signed)
Patient ID: [name], male   DOB: [birth year], 76 y.o.   MRN: [id number]    DATE: 08/19/2016  Location:    [facility] Room Number: 159 B Place of Service: SNF (31)   Extended Emergency Contact Information Primary Emergency Contact: Lianne Moris Address: [place], [place] Mobile Phone: [phone] Relation: Spouse Secondary Emergency Contact: Stedron,Cathy  [place] Mobile Phone: [phone] Relation: Daughter Preferred language: English  Advanced Directive information Does Patient Have a Medical Advance Directive?: No;Yes, Type of Advance Directive: Out of facility DNR (pink MOST or yellow form), Pre-existing out of facility DNR order (yellow form or pink MOST form): Yellow form placed in chart (order not valid for inpatient use), Does patient want to make changes to medical advance directive?: No - Patient declined  Chief Complaint  Patient presents with  . Medical Management of Chronic Issues    Routine Visit [facility]    HPI:  76 yo male long term resident seen today for f/u. He c/o pain in abdomen. He was dx with MRSA right heel and is currently taking levaqiun x 10 days. Wound mx by facility wound care provider. No f/c. No N/V. Appetite ok. Sleeps ok. No nursing issues. He is on contact isolation.  Chronic systolic heart failure - EF 30-35% (10-22-15). Takes lasix 80 mg twice daily with k+ 20 meq  Daily;  coreg 3.125 mg twice daily   Hypertension - stable on coreg 3.125 mg twice daily   CAD s/p CABG and stents - stable on ASA 81 mg daily and ntg prn  Chronic PE - stable on xarelto 20 mg daily  Hyperlipidemia - stable on liptor 20 mg daily. LDL 87  DM - stable. A1c 5.7%. Takes NPH 20 units twice daily and humalog 8 units with meals with an additional 3 units for cbg >150. 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 stable on ASA 81 mg; pain stable on vicodin 7.5/325 mg every 4 hours as needed   Hx Anemia - stable on iron daily. Hgb 9.6  COPD/chronic respiratory failure with hypoxia - stable on chronic O2 therapy. No recent exacerbations  Constipation - stable on colace twice daily; senna s twice daily; miralax daily; amitiza 8 mcg twice daily   Chronic pain syndrome/Peripheral neuropathy - stable on cymbalta 90 mg daily; neurontin 600 mg three times daily; robaxin 500 mg three times daily for spasms  Anxiety with depression - mood stable on cymbalta 90 mg daily; trazodone 100 mg nightly;  ativan 0.5 mg twice daily as needed  GERD - stable on prilosec 40 mg daily  Bilateral shoulder pain - stable on lidoderm patch; vicodin 7.5/325 mg every 4 hours as needed   Vit D deficiency - stable on vitamin d 50,000 units weekly   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 Followed by urology  Protein calorie malnutrition - albumin 2.8. Gets nutritional supplements per facility protocol    Past Medical History:  Diagnosis Date  . Adhesive capsulitis   . Angina   . Anxiety   . CAD (coronary artery disease)   . CHF (congestive heart failure) ([place])   . Chronic back pain   . Chronic kidney disease    hx of BPH  .  COPD (chronic obstructive pulmonary disease) ([place])   . CVA (cerebral infarction) Questionable history  . Depression   . Diabetes mellitus   . Diverticulosis   . Falls frequently 06/2014  . GERD (gastroesophageal reflux disease)   . Hyperlipidemia   . Hypertension   . 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 Myocardial infarction ([place])   . Neuromuscular disorder ([place])    HX of diabetic periferal neuropathy  . On home oxygen therapy    "1.5L prn" (04/12/2014)  . Osteomyelitis ([place]) 2012   Sternoclavicular joint   . 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 Peripheral vascular disease ([place])   . Shortness of breath   . Sleep apnea     Past Surgical History:  Procedure Laterality Date  . AMPUTATION  03/16/2011   Procedure: AMPUTATION DIGIT;  Surgeon: Angelia Mould, MD;  Location: [facility] OR;  Service: Vascular;  Laterality: Left;  Great toe  . AMPUTATION Left 11/08/2015   Procedure: AMPUTATION ABOVE KNEE LEFT;  Surgeon: Waynetta Sandy, MD;  Location: [facility];  Service: Vascular;  Laterality: Left;  . CARDIAC CATHETERIZATION  October 18, 2010   Known obstructive disease, no further blockages  . CARDIAC CATHETERIZATION  03/17/2005   EF 35-40%  . COLONOSCOPY  2006    Multiple polyps removed, repeat in 5 years  . CORONARY ARTERY BYPASS GRAFT  1992  . [place], 2002  . DOPPLER ECHOCARDIOGRAPHY  Sept 2011   EF 50-55% with some impaired diastolic relaxation  . FEMORAL-POPLITEAL BYPASS GRAFT  2008   Left  . FRACTURE SURGERY  1980s   Hip  . HIP ARTHROPLASTY Left   . PERIPHERAL VASCULAR CATHETERIZATION N/A 11/06/2015   Procedure: Abdominal Aortogram;  Surgeon: Serafina Mitchell, MD;  Location: [facility];  Service: Cardiovascular;  Laterality: N/A;  . PERIPHERAL VASCULAR CATHETERIZATION Bilateral 11/06/2015   Procedure: Lower Extremity Angiography;  Surgeon: Serafina Mitchell, MD;  Location: [facility];  Service: Cardiovascular;  Laterality: Bilateral;  . Right iliopopliteal bypass - [place]  . septic arthritis     Removal of infected CABG wire by Dr Arlyce Dice - 2011  . SPINE SURGERY  2002   Cervical fusion vertebroplasty C3-4-5  . US ECHOCARDIOGRAPHY  03/04/2006   EF 50-55%    Patient Care Team: Gildardo Cranker, DO as PCP - General (Internal Medicine) Darlin Coco, MD (Cardiology) Clent Jacks, MD (Ophthalmology) Christin Fudge, MD as Consulting Physician (Surgery) Inpatient, Rehab Nurse Manager, RN as [place], [place] ([place])  Social History   Social History  . Marital status: Married    Spouse name: Butch Penny  . Number of children: 3  . Years of education: 12   Occupational History  . Retired-machinest/welder Retired   Social History Main Topics  . Smoking status: Former Smoker    Quit date: 06/19/2000  . Smokeless tobacco: Never Used  . Alcohol use No  . Drug use: No  . Sexual activity: No   Other Topics Concern  . Not on file   Social History Narrative   Health Care POA: TBD   Emergency Contact: wife, Butch Penny [phone]   End of Life Plan: pt does not have AD and not interested.   Who lives with you: Butch Penny and step-daughter-Betty   Any pets: none   Diet: Pt has a varied diet of protein, starch  and vegetables.   Exercise: Pt does not have regular exercise routine.   Seatbelts: Pt reports wearing seatbelt when in vehicles.    Hobbies: listening to car races.               reports that he quit smoking about 16 years ago. He has never used smokeless tobacco. He reports that he does not drink alcohol or use drugs.  Family History  Problem Relation Age of Onset  . Heart disease Father   . Heart disease Mother    Family Status  Relation Status  . Father Deceased  . Mother Deceased    Immunization History  Administered Date(s) Administered  . Influenza Split 12/12/2010  . Influenza Whole 12/22/2005, 12/14/2007  . Influenza,inj,Quad PF,36+ Mos 01/20/2013, 12/09/2013  . Influenza-Unspecified 12/15/2014, 01/15/2016  . Pneumococcal Conjugate-13 10/10/2013  . Pneumococcal Polysaccharide-23 11/23/1999, 03/25/2003, 05/25/2011  . Td 10/23/2003  . Tdap 07/17/2014    Allergies  Allergen Reactions  . Diazepam Anxiety    REACTION: makes patient cry  . Vancomycin Other (See Comments)    "red man syndrome"    Medications: Patient's Medications  New Prescriptions   No medications on file  Previous Medications   ACETAMINOPHEN (TYLENOL) 325 MG TABLET    Take 650 mg by mouth every 6  (six) hours as needed for fever.   AMINO ACIDS-PROTEIN HYDROLYS (FEEDING SUPPLEMENT, PRO-STAT SUGAR FREE 64,) LIQD    Take 30 mLs by mouth 2 (two) times daily.   ASPIRIN EC 81 MG EC TABLET    Take 1 tablet (81 mg total) by mouth daily.   ATORVASTATIN (LIPITOR) 20 MG TABLET    Take 20 mg by mouth at bedtime.    BISACODYL (DULCOLAX) 10 MG SUPPOSITORY    Place 1 suppository (10 mg total) rectally daily as needed for moderate constipation.   CARVEDILOL (COREG) 3.125 MG TABLET    Take 3.125 mg by mouth 2 (two) times daily with a meal.    CETIRIZINE (ZYRTEC ALLERGY) 10 MG TABLET    Take 10 mg by mouth daily with breakfast.    DICLOFENAC SODIUM (VOLTAREN) 1 % GEL    Apply 4 g topically 3 (three) times daily.   DOCUSATE SODIUM (COLACE) 100 MG CAPSULE    Take 100 mg by mouth 2 (two) times daily.   DULOXETINE (CYMBALTA) 30 MG CAPSULE    Take 90 mg by mouth daily with breakfast.    FERROUS SULFATE 325 (65 FE) MG TABLET    Take 1 tablet (325 mg total) by mouth daily with breakfast.   FUROSEMIDE (LASIX) 80 MG TABLET    Take 1 tablet (80 mg total) by mouth 2 (two) times daily.   GABAPENTIN (NEURONTIN) 600 MG TABLET    Take 600 mg by mouth 3 (three) times daily.    GUAIFENESIN-DEXTROMETHORPHAN (ROBITUSSIN DM) 100-10 MG/5ML SYRUP    Take 15 mLs by mouth every 4 (four) hours as needed for cough.   HYDROCODONE-ACETAMINOPHEN (NORCO/VICODIN) 5-325 MG TABLET    Take 1 tablet by mouth every 4 (four) hours as needed for moderate pain.   INSULIN LISPRO (HUMALOG) 100 UNIT/ML CARTRIDGE    Inject 0-13 Units into the skin 3 (three) times daily with meals. Inject as per sliding scale: if  0-70=0 units; 71-149 = 8 units, 150-600= 13 units subcutaneously before meals related to DM.   INSULIN NPH HUMAN (HUMULIN N,NOVOLIN N) 100 UNIT/ML INJECTION    Inject 20 Units into the skin 3 (three) times daily before meals.  LEVOFLOXACIN (LEVAQUIN) 500 MG TABLET    Take 500 mg by mouth daily.   LUBIPROSTONE (AMITIZA) 8 MCG CAPSULE    Take  8 mcg by mouth 2 (two) times daily with a meal.   METHOCARBAMOL (ROBAXIN) 500 MG TABLET    Take 500 mg by mouth at bedtime.   MULTIPLE VITAMINS-MINERALS (DECUBI-VITE PO)    Take 1 capsule by mouth daily with breakfast.    NITROGLYCERIN (NITROSTAT) 0.4 MG SL TABLET    Place 1 tablet (0.4 mg total) under the tongue as needed. For chest pains   OMEPRAZOLE (PRILOSEC) 40 MG CAPSULE    Take 1 capsule (40 mg total) by mouth daily.   OXYGEN    Inhale 2 L into the lungs continuous.    POLYETHYLENE GLYCOL (MIRALAX / GLYCOLAX) PACKET    Take 17 g by mouth daily with breakfast.    POTASSIUM CHLORIDE SA (K-DUR,KLOR-CON) 20 MEQ TABLET    Take 20 mEq by mouth daily with breakfast.    PROPYLENE GLYCOL (SYSTANE BALANCE) 0.6 % SOLN    Place 1 drop into both eyes 2 (two) times daily.    RIVAROXABAN (XARELTO) 20 MG TABS TABLET    Take 20 mg by mouth daily with supper.   SENNA-DOCUSATE (SENOKOT-S) 8.6-50 MG TABLET    Take 1 tablet by mouth 2 (two) times daily.   SKIN PROTECTANTS, MISC. (CALAZIME SKIN PROTECTANT EX)    Apply 1 application topically daily. Apply to sacrum and right buttock every day   TAMSULOSIN (FLOMAX) 0.4 MG CAPS CAPSULE    Take 1 capsule (0.4 mg total) by mouth daily after supper.   TRAMADOL (ULTRAM) 50 MG TABLET    Take 1 tablet (50 mg total) by mouth 2 (two) times daily.   TRAZODONE (DESYREL) 100 MG TABLET    Take 100 mg by mouth at bedtime.    VITAMIN D, ERGOCALCIFEROL, (DRISDOL) 50000 UNITS CAPS CAPSULE    Take 1 capsule (50,000 Units total) by mouth every 7 (seven) days.   VITAMINS A & D (VITAMIN A & D) OINTMENT    Apply 1 application topically 2 (two) times daily as needed for dry skin.  Modified Medications   No medications on file  Discontinued Medications   INSULIN NPH HUMAN (HUMULIN N,NOVOLIN N) 100 UNIT/ML INJECTION    Inject 0.2 mLs (20 Units total) into the skin 2 (two) times daily before a meal.   LIDOCAINE (LIDODERM) 5 %    Place 1 patch onto the skin daily. *Apply 0.5 a patch to  each shoulder every morning*   LORAZEPAM (ATIVAN) 0.5 MG TABLET    Take 1 tablet (0.5 mg total) by mouth 2 (two) times daily as needed for anxiety.   METHOCARBAMOL (ROBAXIN) 500 MG TABLET    Take 1 tablet (500 mg total) by mouth 3 (three) times daily.    Review of Systems  Constitutional: Positive for appetite change.  Gastrointestinal: Positive for abdominal pain.  Musculoskeletal: Positive for arthralgias, back pain and gait problem.  Skin: Positive for wound.  All other systems reviewed and are negative.   Vitals:   08/19/16 1519  BP: (!) 104/52  Pulse: 88  Resp: 18  Temp: 98.2 F (36.8 C)  TempSrc: Oral  SpO2: (!) 89%  Weight: 229 lb 12.8 oz (104.2 kg)  Height: 5\' 7"  (1.702 m)   Body mass index is 35.99 kg/m.  Physical Exam  Constitutional: He appears well-developed.  Frail appearing in NAD. Sitting up in bed. [place] O2 intact  HENT:  Mouth/Throat: Oropharynx is clear and moist. No oropharyngeal exudate.  MMM; no oral thrush  Eyes: Pupils are equal, round, and reactive to light. No scleral icterus.  Neck: Neck supple. Carotid bruit is not present. No thyromegaly present.  Cardiovascular: Normal rate, regular rhythm and intact distal pulses.  Exam reveals no gallop and no friction rub.   Murmur (1/6 SEM) heard. Pulses:      Left popliteal pulse not accessible.       Dorsalis pedis pulses are 2+ on the right side. Left dorsalis pedis pulse not accessible.       Posterior tibial pulses are 2+ on the right side. Left posterior tibial pulse not accessible.  No RLE edema. No right calf TTP. Left AKA  Pulmonary/Chest: Effort normal. No respiratory distress. He has no wheezes. He has no rales. He exhibits no tenderness.  Reduced BS at base b/l. No w/r/r  Abdominal: Soft. Bowel sounds are normal. He exhibits no distension, no fluid wave, no abdominal bruit, no pulsatile midline mass and no mass. There is no hepatomegaly. There is tenderness (midepigastric). There is no rebound and  no guarding.  Musculoskeletal: He exhibits edema, tenderness and deformity (left AKA no dehiscence).  Lymphadenopathy:    He has no cervical adenopathy.  Neurological: He is alert.  Skin: Skin is warm, dry and intact. No rash noted. No erythema.  Right heel dsg c/d/i  Psychiatric: He has a normal mood and affect. His behavior is normal.  Vitals reviewed.    Labs reviewed: Hospital Outpatient Visit on 08/06/2016  Component Date Value Ref Range Status  . Creatinine, Ser 08/06/2016 0.80  0.61 - 1.24 mg/dL Final  Abstract on 06/09/2016  Component Date Value Ref Range Status  . Hemoglobin 05/08/2016 10.6* 13.5 - 17.5 g/dL Final  . HCT 05/08/2016 35* 41 - 53 % Final  . Neutrophils Absolute 05/08/2016 8  /L Final  . Platelets 05/08/2016 329  150 - 399 K/L Final  . WBC 05/08/2016 10.7  10^3/mL Final  . Triglycerides 05/08/2016 125  40 - 160 mg/dL Final  . Cholesterol 05/08/2016 139  0 - 200 mg/dL Final  . HDL 05/08/2016 31* 35 - 70 mg/dL Final  . LDL Cholesterol 05/08/2016 83  mg/dL Final  . Vitamin B-12 02/27/2016 337   Final  . Hemoglobin A1C 04/25/2016 6.2   Final    Mr Heel Right W Wo Contrast  Result Date: 08/06/2016 CLINICAL DATA:  Diabetes and wound of the right heel, clinical concern for osteomyelitis EXAM: MR OF THE RIGHT HEEL WITHOUT AND WITH CONTRAST TECHNIQUE: Multiplanar, multisequence MR imaging of the ankle was performed before and after the administration of intravenous contrast. CONTRAST:  66mL MULTIHANCE GADOBENATE DIMEGLUMINE 529 MG/ML IV SOLN COMPARISON:  Radiographs from 01/26/2016 FINDINGS: Despite efforts by the technologist and patient, motion artifact is present on today's exam and could not be eliminated. This reduces exam sensitivity and specificity. TENDONS Peroneal: Mild peroneus brevis and longus tendinopathy and tenosynovitis. Posteromedial: Distal tibialis posterior tendinopathy. Anterior: Unremarkable Achilles: Unremarkable Plantar Fascia: Mild thickening  and accentuated signal in the medial band of the plantar fascia proximally. LIGAMENTS Lateral: Low-level edema tracks along the margins of the calcaneofibular ligament, but is are probably related to the underlying bony pathology rather than the ligament. Medial: Unremarkable CARTILAGE Ankle Joint: Unremarkable Subtalar Joints/Sinus Tarsi: Unremarkable Bones: Underlying the ulceration along the lateral plantar heel, there is abnormal edema and enhancement locally in the calcaneus compatible with osteomyelitis. Cortical disruption is shown on image  9/7. Other: Low-level edema and enhancement track in the flexor digitorum brevis and abductor digiti minimi muscles, and myositis is not excluded. No drainable abscess observed. There is cellulitis along the plantar-lateral heel ulceration with thinning of the soft tissues overlying the adjacent involved portion of the calcaneus. There is low-level edema in the extensor digitorum brevis muscle but without enhancement. IMPRESSION: IMPRESSION 1. Focal osteomyelitis in the calcaneus underlying the plantar -lateral ulceration, with some associated focal cortical disruption and local marrow edema and enhancement. 2. Mild abnormal enhancement tracks in the abductor digiti minimi and flexor digitorum brevis muscles along the plantar foot, and could reflect myositis. 3. Plantar fasciitis involving the medial band proximally. 4. Mild peroneus tendinopathy and tenosynovitis. 5. Mild distal tibialis posterior tendinopathy, correlate clinically in assessing for tibialis posterior dysfunction. 6. The ulceration along the plantar -lateral heel is associated with surrounding cellulitis. Electronically Signed   By: Van Clines M.D.   On: 08/06/2016 16:43     Assessment/Plan   ICD-10-CM   1. MRSA (methicillin resistant Staphylococcus aureus) infection - right heel A49.02   2. 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  Chronic systolic CHF (congestive heart failure) (HCC) I50.22   4. Essential hypertension I10   5. S/P AKA (above knee amputation) unilateral, left ([place]) Z89.612   6. Peripheral vascular disease (HCC) I73.9   7. 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 Right renal mass N28.89     Complete 10 days levaquin for MRSA heel infection. Start date 08/16/16  Maintain contact isolation until abx completed  Check cbc w diff, cmp and lipid panel  Cont other meds as ordered  PT/OT/ST as indicated  Wound care as ordered  F/u with specialists as scheduled  Cont nutritional supplements as ordered  [facility] NP to follow  Will follow  Jonda Alanis S. Perlie Gold  [facility] [address] [place], [place] [address] [phone] Cell (Monday-Friday 8 AM - 5 PM) [phone] After 5 PM and follow prompts

## 2016-08-26 ENCOUNTER — Non-Acute Institutional Stay (SKILLED_NURSING_FACILITY): Payer: Medicare Other

## 2016-08-26 DIAGNOSIS — Z0000 Encounter for general adult medical examination without abnormal findings: Secondary | ICD-10-CM | POA: Diagnosis not present

## 2016-08-26 NOTE — Progress Notes (Signed)
Subjective:   [name] is a 76 y.o. male who presents for Medicare Annual/Subsequent preventive examination at [facility]      Objective:    Vitals: BP 122/64 (BP Location: Right Arm, Patient Position: Supine)   Pulse 87   Temp 97.7 F (36.5 C) (Oral)   Ht 5\' 7"  (1.702 m)   Wt 230 lb (104.3 kg)   SpO2 96%   BMI 36.02 kg/m   Body mass index is 36.02 kg/m.  Tobacco History  Smoking Status  . Former Smoker  . Quit date: 06/19/2000  Smokeless Tobacco  . Never Used     Counseling given: Not Answered   Past Medical History:  Diagnosis Date  . Adhesive capsulitis   . Angina   . Anxiety   . CAD (coronary artery disease)   . CHF (congestive heart failure) ([place])   . Chronic back pain   . Chronic kidney disease    hx of BPH  . COPD (chronic obstructive pulmonary disease) ([place])   . CVA (cerebral infarction) Questionable history  . Depression   . Diabetes mellitus   . Diverticulosis   . Falls frequently 06/2014  . GERD (gastroesophageal reflux disease)   . Hyperlipidemia   . Hypertension   . 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 Myocardial infarction ([place])   . Neuromuscular disorder ([place])    HX of diabetic periferal neuropathy  . On home oxygen therapy    "1.5L prn" (04/12/2014)  . Osteomyelitis ([place]) 2012   Sternoclavicular joint   . PE (pulmonary embolism) 10-15 years ago   Lifelong Coumadin  . Peripheral vascular disease ([place])   . Shortness of breath   . Sleep apnea    Past Surgical History:  Procedure Laterality Date  . AMPUTATION  03/16/2011   Procedure: AMPUTATION DIGIT;  Surgeon: Angelia Mould, MD;  Location: [facility] OR;  Service: Vascular;  Laterality: Left;  Great toe  . AMPUTATION Left 11/08/2015   Procedure: AMPUTATION ABOVE KNEE LEFT;  Surgeon: Waynetta Sandy, MD;  Location: [facility];  Service: Vascular;  Laterality: Left;  . CARDIAC CATHETERIZATION  October 18, 2010   Known  obstructive disease, no further blockages  . CARDIAC CATHETERIZATION  03/17/2005   EF 35-40%  . COLONOSCOPY  2006    Multiple polyps removed, repeat in 5 years  . CORONARY ARTERY BYPASS GRAFT  1992  . [place], 2002  . DOPPLER ECHOCARDIOGRAPHY  Sept 2011   EF 50-55% with some impaired diastolic relaxation  . FEMORAL-POPLITEAL BYPASS GRAFT  2008   Left  . FRACTURE SURGERY  1980s   Hip  . HIP ARTHROPLASTY Left   . PERIPHERAL VASCULAR CATHETERIZATION N/A 11/06/2015   Procedure: Abdominal Aortogram;  Surgeon: Serafina Mitchell, MD;  Location: [facility];  Service: Cardiovascular;  Laterality: N/A;  . PERIPHERAL VASCULAR CATHETERIZATION Bilateral 11/06/2015   Procedure: Lower Extremity Angiography;  Surgeon: Serafina Mitchell, MD;  Location: [facility];  Service: Cardiovascular;  Laterality: Bilateral;  . Right iliopopliteal bypass - [place]  . septic arthritis     Removal of infected CABG wire by Dr Arlyce Dice - 2011  . SPINE SURGERY  2002   Cervical fusion vertebroplasty C3-4-5  . US ECHOCARDIOGRAPHY  03/04/2006   EF 50-55%   Family History  Problem Relation Age of Onset  . Heart disease Father   . Heart  disease Mother    History  Sexual Activity  . Sexual activity: No    Outpatient Encounter Prescriptions as of 08/26/2016  Medication Sig  . acetaminophen (TYLENOL) 325 MG tablet Take 650 mg by mouth every 6 (six) hours as needed for fever.  . Amino Acids-Protein Hydrolys (FEEDING SUPPLEMENT, PRO-STAT SUGAR FREE 64,) LIQD Take 30 mLs by mouth 2 (two) times daily.  [place] Kitchen aspirin EC 81 MG EC tablet Take 1 tablet (81 mg total) by mouth daily.  Marland Kitchen atorvastatin (LIPITOR) 20 MG tablet Take 20 mg by mouth at bedtime.   . bisacodyl (DULCOLAX) 10 MG suppository Place 1 suppository (10 mg total) rectally daily as needed for moderate constipation.  . carvedilol (COREG) 3.125 MG tablet Take 3.125 mg by mouth 2 (two) times daily with a meal.   . cetirizine (ZYRTEC  ALLERGY) 10 MG tablet Take 10 mg by mouth daily with breakfast.   . diclofenac sodium (VOLTAREN) 1 % GEL Apply 4 g topically 3 (three) times daily.  Mar[place] Kitchen docusate sodium (COLACE) 100 MG capsule Take 100 mg by mouth 2 (two) times daily.  . DULoxetine (CYMBALTA) 30 MG capsule Take 90 mg by mouth daily with breakfast.   . ferrous sulfate 325 (65 FE) MG tablet Take 1 tablet (325 mg total) by mouth daily with breakfast.  . furosemide (LASIX) 80 MG tablet Take 1 tablet (80 mg total) by mouth 2 (two) times daily.  Marland Kitchen gabapentin (NEURONTIN) 600 MG tablet Take 600 mg by mouth 3 (three) times daily.   Marland Kitchen guaiFENesin-dextromethorphan (ROBITUSSIN DM) 100-10 MG/5ML syrup Take 15 mLs by mouth every 4 (four) hours as needed for cough.  Marland Kitchen HYDROcodone-acetaminophen (NORCO/VICODIN) 5-325 MG tablet Take 1 tablet by mouth every 4 (four) hours as needed for moderate pain.  Marland Kitchen insulin lispro (HUMALOG) 100 UNIT/ML cartridge Inject 0-13 Units into the skin 3 (three) times daily with meals. Inject as per sliding scale: if  0-70=0 units; 71-149 = 8 units, 150-600= 13 units subcutaneously before meals related to DM.  Marland Kitchen insulin NPH Human (HUMULIN N,NOVOLIN N) 100 UNIT/ML injection Inject 20 Units into the skin 3 (three) times daily before meals.  Marland Kitchen levofloxacin (LEVAQUIN) 500 MG tablet Take 500 mg by mouth daily.  Marland Kitchen lubiprostone (AMITIZA) 8 MCG capsule Take 8 mcg by mouth 2 (two) times daily with a meal.  . methocarbamol (ROBAXIN) 500 MG tablet Take 500 mg by mouth at bedtime.  . Multiple Vitamins-Minerals (DECUBI-VITE PO) Take 1 capsule by mouth daily with breakfast.   . nitroGLYCERIN (NITROSTAT) 0.4 MG SL tablet Place 1 tablet (0.4 mg total) under the tongue as needed. For chest pains (Patient taking differently: Place 0.4 mg under the tongue every 5 (five) minutes as needed for chest pain. )  . omeprazole (PRILOSEC) 40 MG capsule Take 1 capsule (40 mg total) by mouth daily.  . OXYGEN Inhale 2 L into the lungs continuous.   .  polyethylene glycol (MIRALAX / GLYCOLAX) packet Take 17 g by mouth daily with breakfast.   . potassium chloride SA (K-DUR,KLOR-CON) 20 MEQ tablet Take 20 mEq by mouth daily with breakfast.   . Propylene Glycol (SYSTANE BALANCE) 0.6 % SOLN Place 1 drop into both eyes 2 (two) times daily.   . rivaroxaban (XARELTO) 20 MG TABS tablet Take 20 mg by mouth daily with supper.  . senna-docusate (SENOKOT-S) 8.6-50 MG tablet Take 1 tablet by mouth 2 (two) times daily.  . Skin Protectants, Misc. (CALAZIME SKIN PROTECTANT EX) Apply 1 application topically daily.  Apply to sacrum and right buttock every day  . tamsulosin (FLOMAX) 0.4 MG CAPS capsule Take 1 capsule (0.4 mg total) by mouth daily after supper.  . traMADol (ULTRAM) 50 MG tablet Take 1 tablet (50 mg total) by mouth 2 (two) times daily.  . traZODone (DESYREL) 100 MG tablet Take 100 mg by mouth at bedtime.   . Vitamin D, Ergocalciferol, (DRISDOL) 50000 units CAPS capsule Take 1 capsule (50,000 Units total) by mouth every 7 (seven) days.  . Vitamins A & D (VITAMIN A & D) ointment Apply 1 application topically 2 (two) times daily as needed for dry skin.   No facility-administered encounter medications on file as of 08/26/2016.     Activities of Daily Living In your present state of health, do you have any difficulty performing the following activities: 04/29/2016 11/01/2015  Hearing? [place]? N -  Difficulty concentrating or making decisions? Y -  Walking or climbing stairs? Y -  Dressing or bathing? Y -  Doing errands, shopping? [place]  Some recent data might be hidden    Patient Care Team: Gildardo Cranker, DO as PCP - General (Internal Medicine) Darlin Coco, MD (Cardiology) Clent Jacks, MD (Ophthalmology) Christin Fudge, MD as Consulting Physician (Surgery) Inpatient, Rehab Nurse Manager, RN as [place], [place] ([place])   Assessment:    Exercise Activities and Dietary recommendations     Goals    None     Fall Risk Fall Risk  08/10/2015 09/28/2014 07/18/2014 06/06/2014 05/05/2014  Falls in the past year? Yes Yes Yes Yes Yes  Number falls in past yr: 1 2 or more 2 or more 2 or more 2 or more  Injury with Fall? Yes Yes Yes Yes Yes  Risk Factor Category  - High Fall Risk High Fall Risk High Fall Risk High Fall Risk  Risk for fall due to : - History of fall(s);Impaired balance/gait;Impaired mobility History of fall(s);Impaired balance/gait;Impaired mobility - -  Risk for fall due to (comments): - - - - -  Follow up Falls evaluation completed Falls evaluation completed;Education provided;Falls prevention discussed - - -   Depression Screen PHQ 2/9 Scores 09/28/2014 08/29/2014 06/06/2014 05/05/2014  PHQ - 2 Score 0 0 0 0  PHQ- 9 Score - - - -  Exception Documentation Patient refusal - - -    Cognitive Function MMSE - Mini Mental State Exam 10/12/2013 01/21/2013  Orientation to time 5 5  Orientation to Place 5 5  Registration 3 3  Attention/ Calculation 5 5  Recall 3 2  Language- name 2 objects 2 2  Language- repeat 1 1  Language- follow 3 step command 3 3  Language- read & follow direction 1 1  Write a sentence 1 1  Copy design 1 1  Total score 30 29        Immunization History  Administered Date(s) Administered  . Influenza Split 12/12/2010  . Influenza Whole 12/22/2005, 12/14/2007  . Influenza,inj,Quad PF,36+ Mos 01/20/2013, 12/09/2013  . Influenza-Unspecified 12/15/2014, 01/15/2016  . Pneumococcal Conjugate-13 10/10/2013  . Pneumococcal Polysaccharide-23 11/23/1999, 03/25/2003, 05/25/2011  . Td 10/23/2003  . Tdap 07/17/2014   Screening Tests Health Maintenance  Topic Date Due  . COLONOSCOPY  09/10/2009  . URINE MICROALBUMIN  06/06/2015  . OPHTHALMOLOGY EXAM  03/12/2016  . FOOT EXAM  05/16/2016  . INFLUENZA VACCINE  10/22/2016  . HEMOGLOBIN A1C  10/23/2016  . TETANUS/TDAP  07/16/2024  . PNA vac Low Risk Adult  Completed      Plan:  I have personally  reviewed and addressed the Medicare Annual Wellness questionnaire and have noted the following in the patient's chart:  A. Medical and social history B. Use of alcohol, tobacco or illicit drugs  C. Current medications and supplements D. Functional ability and status E.  Nutritional status F.  Physical activity G. Advance directives H. List of other physicians I.  Hospitalizations, surgeries, and ER visits in previous 12 months J.  [place] to include hearing, vision, cognitive, depression L. Referrals and appointments - none  In addition, I have reviewed and discussed with patient certain preventive protocols, quality metrics, and best practice recommendations. A written personalized care plan for preventive services as well as general preventive health recommendations were provided to patient.  See attached scanned questionnaire for additional information.   Signed,   Rich Reining, RN Nurse Health Advisor   Quick Notes   Health Maintenance: Eye exam due     Abnormal Screen: 6 Cit-2     Patient Concerns: C/o headaches all the time.     Nurse Concerns: None

## 2016-08-26 NOTE — Patient Instructions (Signed)
[name] , Thank you for taking time to come for your Medicare Wellness Visit. I appreciate your ongoing commitment to your health goals. Please review the following plan we discussed and let me know if I can assist you in the future.   Screening recommendations/referrals: Colonoscopy- pt over age 78 Recommended yearly ophthalmology/optometry visit for glaucoma screening and checkup Recommended yearly dental visit for hygiene and checkup  Vaccinations: Influenza vaccine due 07/16/2016 Pneumococcal vaccine up to date Tdap vaccine due 07/16/24 Shingles vaccine not in records  Advanced directives: DNR in chart  Conditions/risks identified: None  Next appointment: None upcoming  Preventive Care 57 Years and Older, Male Preventive care refers to lifestyle choices and visits with your health care provider that can promote health and wellness. What does preventive care include?  A yearly physical exam. This is also called an annual well check.  Dental exams once or twice a year.  Routine eye exams. Ask your health care provider how often you should have your eyes checked.  Personal lifestyle choices, including:  Daily care of your teeth and gums.  Regular physical activity.  Eating a healthy diet.  Avoiding tobacco and drug use.  Limiting alcohol use.  Practicing safe sex.  Taking low doses of aspirin every day.  Taking vitamin and mineral supplements as recommended by your health care provider. What happens during an annual well check? The services and screenings done by your health care provider during your annual well check will depend on your age, overall health, lifestyle risk factors, and family history of disease. Counseling  Your health care provider may ask you questions about your:  Alcohol use.  Tobacco use.  Drug use.  Emotional well-being.  Home and relationship well-being.  Sexual activity.  Eating habits.  History of falls.  Memory and  ability to understand (cognition).  Work and work Statistician. Screening  You may have the following tests or measurements:  Height, weight, and BMI.  Blood pressure.  Lipid and cholesterol levels. These may be checked every 5 years, or more frequently if you are over 43 years old.  Skin check.  Lung cancer screening. You may have this screening every year starting at age 78 if you have a 30-pack-year history of smoking and currently smoke or have quit within the past 15 years.  Fecal occult blood test (FOBT) of the stool. You may have this test every year starting at age 28.  Flexible sigmoidoscopy or colonoscopy. You may have a sigmoidoscopy every 5 years or a colonoscopy every 10 years starting at age 99.  Prostate cancer screening. Recommendations will vary depending on your family history and other risks.  Hepatitis C blood test.  Hepatitis B blood test.  Sexually transmitted disease (STD) testing.  Diabetes screening. This is done by checking your blood sugar (glucose) after you have not eaten for a while (fasting). You may have this done every 1-3 years.  Abdominal aortic aneurysm (AAA) screening. You may need this if you are a current or former smoker.  Osteoporosis. You may be screened starting at age 1 if you are at high risk. Talk with your health care provider about your test results, treatment options, and if necessary, the need for more tests. Vaccines  Your health care provider may recommend certain vaccines, such as:  Influenza vaccine. This is recommended every year.  Tetanus, diphtheria, and acellular pertussis (Tdap, Td) vaccine. You may need a Td booster every 10 years.  Zoster vaccine. You may need this after  age 71.  Pneumococcal 13-valent conjugate (PCV13) vaccine. One dose is recommended after age 27.  Pneumococcal polysaccharide (PPSV23) vaccine. One dose is recommended after age 3. Talk to your health care provider about which screenings and  vaccines you need and how often you need them. This information is not intended to replace advice given to you by your health care provider. Make sure you discuss any questions you have with your health care provider. Document Released: 04/06/2015 Document Revised: 11/28/2015 Document Reviewed: 01/09/2015 Elsevier Interactive Patient Education  2017 [place] Prevention in the Home Falls can cause injuries. They can happen to people of all ages. There are many things you can do to make your home safe and to help prevent falls. What can I do on the outside of my home?  Regularly fix the edges of walkways and driveways and fix any cracks.  Remove anything that might make you trip as you walk through a door, such as a raised step or threshold.  Trim any bushes or trees on the path to your home.  Use bright outdoor lighting.  Clear any walking paths of anything that might make someone trip, such as rocks or tools.  Regularly check to see if handrails are loose or broken. Make sure that both sides of any steps have handrails.  Any raised decks and porches should have guardrails on the edges.  Have any leaves, snow, or ice cleared regularly.  Use sand or salt on walking paths during winter.  Clean up any spills in your garage right away. This includes oil or grease spills. What can I do in the bathroom?  Use night lights.  Install grab bars by the toilet and in the tub and shower. Do not use towel bars as grab bars.  Use non-skid mats or decals in the tub or shower.  If you need to sit down in the shower, use a plastic, non-slip stool.  Keep the floor dry. Clean up any water that spills on the floor as soon as it happens.  Remove soap buildup in the tub or shower regularly.  Attach bath mats securely with double-sided non-slip rug tape.  Do not have throw rugs and other things on the floor that can make you trip. What can I do in the bedroom?  Use night  lights.  Make sure that you have a light by your bed that is easy to reach.  Do not use any sheets or blankets that are too big for your bed. They should not hang down onto the floor.  Have a firm chair that has side arms. You can use this for support while you get dressed.  Do not have throw rugs and other things on the floor that can make you trip. What can I do in the kitchen?  Clean up any spills right away.  Avoid walking on wet floors.  Keep items that you use a lot in easy-to-reach places.  If you need to reach something above you, use a strong step stool that has a grab bar.  Keep electrical cords out of the way.  Do not use floor polish or wax that makes floors slippery. If you must use wax, use non-skid floor wax.  Do not have throw rugs and other things on the floor that can make you trip. What can I do with my stairs?  Do not leave any items on the stairs.  Make sure that there are handrails on both sides of the stairs  and use them. Fix handrails that are broken or loose. Make sure that handrails are as long as the stairways.  Check any carpeting to make sure that it is firmly attached to the stairs. Fix any carpet that is loose or worn.  Avoid having throw rugs at the top or bottom of the stairs. If you do have throw rugs, attach them to the floor with carpet tape.  Make sure that you have a light switch at the top of the stairs and the bottom of the stairs. If you do not have them, ask someone to add them for you. What else can I do to help prevent falls?  Wear shoes that:  Do not have high heels.  Have rubber bottoms.  Are comfortable and fit you well.  Are closed at the toe. Do not wear sandals.  If you use a stepladder:  Make sure that it is fully opened. Do not climb a closed stepladder.  Make sure that both sides of the stepladder are locked into place.  Ask someone to hold it for you, if possible.  Clearly mark and make sure that you can  see:  Any grab bars or handrails.  First and last steps.  Where the edge of each step is.  Use tools that help you move around (mobility aids) if they are needed. These include:  Canes.  Walkers.  Scooters.  Crutches.  Turn on the lights when you go into a dark area. Replace any light bulbs as soon as they burn out.  Set up your furniture so you have a clear path. Avoid moving your furniture around.  If any of your floors are uneven, fix them.  If there are any pets around you, be aware of where they are.  Review your medicines with your doctor. Some medicines can make you feel dizzy. This can increase your chance of falling. Ask your doctor what other things that you can do to help prevent falls. This information is not intended to replace advice given to you by your health care provider. Make sure you discuss any questions you have with your health care provider. Document Released: 01/04/2009 Document Revised: 08/16/2015 Document Reviewed: 04/14/2014 Elsevier Interactive Patient Education  2017 [employer].

## 2016-09-30 ENCOUNTER — Telehealth: Payer: Self-pay | Admitting: *Deleted

## 2016-09-30 ENCOUNTER — Telehealth: Payer: Self-pay | Admitting: Vascular Surgery

## 2016-09-30 ENCOUNTER — Encounter: Payer: Self-pay | Admitting: Infectious Disease

## 2016-09-30 ENCOUNTER — Ambulatory Visit (INDEPENDENT_AMBULATORY_CARE_PROVIDER_SITE_OTHER): Payer: Medicare Other | Admitting: Infectious Disease

## 2016-09-30 VITALS — BP 95/63 | HR 89 | Temp 97.9°F

## 2016-09-30 DIAGNOSIS — I7025 Atherosclerosis of native arteries of other extremities with ulceration: Secondary | ICD-10-CM

## 2016-09-30 DIAGNOSIS — Z89612 Acquired absence of left leg above knee: Secondary | ICD-10-CM

## 2016-09-30 DIAGNOSIS — J431 Panlobular emphysema: Secondary | ICD-10-CM

## 2016-09-30 DIAGNOSIS — E1142 Type 2 diabetes mellitus with diabetic polyneuropathy: Secondary | ICD-10-CM | POA: Diagnosis not present

## 2016-09-30 DIAGNOSIS — E1151 Type 2 diabetes mellitus with diabetic peripheral angiopathy without gangrene: Secondary | ICD-10-CM

## 2016-09-30 DIAGNOSIS — M86171 Other acute osteomyelitis, right ankle and foot: Secondary | ICD-10-CM

## 2016-09-30 DIAGNOSIS — I70209 Unspecified atherosclerosis of native arteries of extremities, unspecified extremity: Secondary | ICD-10-CM

## 2016-09-30 DIAGNOSIS — R21 Rash and other nonspecific skin eruption: Secondary | ICD-10-CM | POA: Diagnosis not present

## 2016-09-30 HISTORY — DX: Other acute osteomyelitis, right ankle and foot: M86.171

## 2016-09-30 HISTORY — DX: Rash and other nonspecific skin eruption: R21

## 2016-09-30 MED ORDER — MUPIROCIN 2 % EX OINT: 1.0000 "application " | TOPICAL_OINTMENT | Freq: Two times a day (BID) | CUTANEOUS | 2 refills | Status: DC

## 2016-09-30 NOTE — Telephone Encounter (Signed)
Sched appt 10/03/16 at 9:15. Spoke to [place] at [facility] and spoke to pt's daughter.

## 2016-09-30 NOTE — Telephone Encounter (Signed)
thx Kennyth Lose. Maybe [facility] can get ahold of them

## 2016-09-30 NOTE — Patient Instructions (Signed)
I AM WORRIED ABOUT THE BONE INFECTION IN YOUR HEEL  TO LET ME TREAT THIS WE AT [facility] NEED A DEEP BONE BIOPSY FOR CULTURES  THERE IS POSSIBILITY THAT YOU MAY NEED ANOTHER AMPUTATION HOWEVER TO CONTROL THE INFECTION HERE WHICH IS GOING TO PUT YOU AT RISK FOR SEPSIS  I WOULD LIKE YOU TO DC THE LEVAQUIN FOR NOW  I WOULD LIKE YOU TO SEE DR. CHEN (VASCULAR) VS ORTHOPEDICS, (DR. HEWITT OR DR DUDA)   I HAVE WRITTEN RX FOR MUPIROCIN FOR YOUR FACE  I WOULD RECOMMEND DERMATOLOGY REFERRAL FOR YOUR FACIAL RASH

## 2016-09-30 NOTE — Telephone Encounter (Signed)
-----   Message from Truman Hayward, MD sent at 09/30/2016 11:58 AM EDT ----- Thanks so much we will touch base with them. Can someone get in touch with Althea Charon? ----- Message ----- From: Gildardo Cranker, DO Sent: 09/30/2016  11:33 AM To: Truman Hayward, MD  I am not sure. He is no longer a pt of ours. You may need to contact [facility] for that information. I know Dr Mel Almond of [place] rounds there. He may have performed a bone biopsy or culture.  Hope I was helpful.   ----- Message ----- From: Tommy Medal, Lavell Islam, MD Sent: 09/30/2016  10:32 AM To: Gildardo Cranker, DO  I did not see any bone cultures, if they were done where were they performed and were they really bone cultures?

## 2016-09-30 NOTE — Progress Notes (Signed)
Subjective:    Reason for consult: Calcaneal osteomyelitis  Referring Physician: Gildardo Cranker, DO    Patient ID: [name], male    DOB: 08/29/1940, 76 y.o.   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 This was performed by Servando Snare, MD from [facility] (though the patient told me it was Dr. Bridgett Larsson) in August of 2017. This Spring the patient continued to suffer from a nonhealing ulcer on his right foot and had MRI  Which showed:  Focal osteomyelitis in the calcaneus underlying the plantar -lateral ulceration, with some associated focal cortical disruption and local marrow edema and enhancement.   He was placed on levaquin though there is mention of MRSA. I can see orders for a bone biopsy but I see no documentation of such a procedure, nor do I see any culture results.  He was brought by EMS to our clinic today for evaluation of his osteomyelitis. The patient and EMS thought he was brought for facial rash due to MRSA.  WIfe did not accompany him.   Past Medical History:  Diagnosis Date  . Acute osteomyelitis of calcaneum, right ([place]) 09/30/2016  . Adhesive capsulitis   . Angina   . Anxiety   . CAD (coronary artery disease)   . CHF (congestive heart failure) ([place])   . Chronic back pain   . Chronic kidney disease    hx of BPH  . COPD (chronic obstructive pulmonary disease) ([place])   . CVA (cerebral infarction) Questionable history  . Depression   . Diabetes mellitus   . Diverticulosis   . Facial rash 09/30/2016  . Falls frequently 06/2014  . GERD (gastroesophageal reflux disease)   . Hyperlipidemia   . Hypertension   . 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 Myocardial infarction ([place])   . Neuromuscular disorder ([place])    HX of diabetic periferal  neuropathy  . On home oxygen therapy    "1.5L prn" (04/12/2014)  . Osteomyelitis ([place]) 2012   Sternoclavicular joint   . PE (pulmonary embolism) 10-15 years ago   Lifelong Coumadin  . Peripheral vascular disease ([place])   . Shortness of breath   . Sleep apnea     Past Surgical History:  Procedure Laterality Date  . AMPUTATION  03/16/2011   Procedure: AMPUTATION DIGIT;  Surgeon: Angelia Mould, MD;  Location: [facility] OR;  Service: Vascular;  Laterality: Left;  Great toe  . AMPUTATION Left 11/08/2015   Procedure: AMPUTATION ABOVE KNEE LEFT;  Surgeon: Waynetta Sandy, MD;  Location: [place];  Service: Vascular;  Laterality: Left;  . CARDIAC CATHETERIZATION  October 18, 2010   Known obstructive disease, no further blockages  . CARDIAC CATHETERIZATION  03/17/2005   EF 35-40%  . COLONOSCOPY  2006    Multiple polyps removed, repeat in 5 years  . CORONARY ARTERY BYPASS GRAFT  1992  . [place], 2002  . DOPPLER ECHOCARDIOGRAPHY  Sept 2011   EF 50-55% with some impaired diastolic relaxation  . FEMORAL-POPLITEAL BYPASS GRAFT  2008   Left  . FRACTURE SURGERY  1980s   Hip  . HIP ARTHROPLASTY Left   . PERIPHERAL VASCULAR CATHETERIZATION N/A 11/06/2015   Procedure: Abdominal Aortogram;  Surgeon: Serafina Mitchell, MD;  Location: [place];  Service: Cardiovascular;  Laterality: N/A;  . PERIPHERAL VASCULAR CATHETERIZATION Bilateral 11/06/2015   Procedure: Lower Extremity Angiography;  Surgeon: Serafina Mitchell, MD;  Location: [place] [facility];  Service: Cardiovascular;  Laterality: Bilateral;  . Right iliopopliteal bypass - [place]  . septic arthritis     Removal of infected CABG wire by Dr Arlyce Dice - 2011  . SPINE SURGERY  2002   Cervical fusion vertebroplasty C3-4-5  . US ECHOCARDIOGRAPHY  03/04/2006   EF 50-55%    Family History  Problem Relation Age of Onset  . Heart disease Father   . Heart disease Mother       Social History   Social History    . Marital status: Married    Spouse name: Butch Penny  . Number of children: 3  . Years of education: 12   Occupational History  . Retired-machinest/welder Retired   Social History Main Topics  . Smoking status: Former Smoker    Packs/day: 1.00    Quit date: 06/19/2000  . Smokeless tobacco: Never Used  . Alcohol use No  . Drug use: No  . Sexual activity: No   Other Topics Concern  . None   Social History Narrative   Health Care POA: TBD   Emergency Contact: wife, Butch Penny [phone]   End of Life Plan: pt does not have AD and not interested.   Who lives with you: Butch Penny and step-daughter-Betty   Any pets: none   Diet: Pt has a varied diet of protein, starch and vegetables.   Exercise: Pt does not have regular exercise routine.   Seatbelts: Pt reports wearing seatbelt when in vehicles.    Hobbies: listening to car races.              Allergies  Allergen Reactions  . Diazepam Anxiety    REACTION: makes patient cry  . Vancomycin Other (See Comments)    "red man syndrome"     Current Outpatient Prescriptions:  .  acetaminophen (TYLENOL) 325 MG tablet, Take 650 mg by mouth every 6 (six) hours as needed for fever., Disp: , Rfl:  .  Amino Acids-Protein Hydrolys (FEEDING SUPPLEMENT, PRO-STAT SUGAR FREE 64,) LIQD, Take 30 mLs by mouth 2 (two) times daily., Disp: 900 mL, Rfl: 0 .  aspirin EC 81 MG EC tablet, Take 1 tablet (81 mg total) by mouth daily., Disp: , Rfl:  .  atorvastatin (LIPITOR) 20 MG tablet, Take 20 mg by mouth at bedtime. , Disp: , Rfl:  .  bisacodyl (DULCOLAX) 10 MG suppository, Place 1 suppository (10 mg total) rectally daily as needed for moderate constipation., Disp: 12 suppository, Rfl: 0 .  carvedilol (COREG) 3.125 MG tablet, Take 3.125 mg by mouth 2 (two) times daily with a meal. , Disp: , Rfl:  .  cetirizine (ZYRTEC ALLERGY) 10 MG tablet, Take 10 mg by mouth daily with breakfast. , Disp: , Rfl:  .  diclofenac sodium (VOLTAREN) 1 % GEL, Apply 4 g topically 3  (three) times daily., Disp: , Rfl:  .  docusate sodium (COLACE) 100 MG capsule, Take 100 mg by mouth 2 (two) times daily., Disp: , Rfl:  .  DULoxetine (CYMBALTA) 30 MG capsule, Take 90 mg by mouth daily with breakfast. , Disp: , Rfl:  .  ferrous sulfate 325 (65 FE) MG tablet, Take 1 tablet (325 mg total) by mouth daily with breakfast., Disp: 30 tablet, Rfl: 0 .  furosemide (LASIX) 80 MG tablet, Take  1 tablet (80 mg total) by mouth 2 (two) times daily., Disp: 60 tablet, Rfl: 0 .  gabapentin (NEURONTIN) 600 MG tablet, Take 600 mg by mouth 3 (three) times daily. , Disp: , Rfl:  .  guaiFENesin-dextromethorphan (ROBITUSSIN DM) 100-10 MG/5ML syrup, Take 15 mLs by mouth every 4 (four) hours as needed for cough., Disp: 118 mL, Rfl: 0 .  HYDROcodone-acetaminophen (NORCO/VICODIN) 5-325 MG tablet, Take 1 tablet by mouth every 4 (four) hours as needed for moderate pain., Disp: , Rfl:  .  insulin lispro (HUMALOG) 100 UNIT/ML cartridge, Inject 0-13 Units into the skin 3 (three) times daily with meals. Inject as per sliding scale: if  0-70=0 units; 71-149 = 8 units, 150-600= 13 units subcutaneously before meals related to DM., Disp: , Rfl:  .  insulin NPH Human (HUMULIN N,NOVOLIN N) 100 UNIT/ML injection, Inject 20 Units into the skin 3 (three) times daily before meals., Disp: , Rfl:  .  lubiprostone (AMITIZA) 8 MCG capsule, Take 8 mcg by mouth 2 (two) times daily with a meal., Disp: , Rfl:  .  methocarbamol (ROBAXIN) 500 MG tablet, Take 500 mg by mouth at bedtime., Disp: , Rfl:  .  Multiple Vitamins-Minerals (DECUBI-VITE PO), Take 1 capsule by mouth daily with breakfast. , Disp: , Rfl:  .  mupirocin ointment (BACTROBAN) 2 %, Place 1 application into the nose 2 (two) times daily., Disp: 30 g, Rfl: 2 .  nitroGLYCERIN (NITROSTAT) 0.4 MG SL tablet, Place 1 tablet (0.4 mg total) under the tongue as needed. For chest pains (Patient taking differently: Place 0.4 mg under the tongue every 5 (five) minutes as needed for  chest pain. ), Disp: 30 tablet, Rfl: 6 .  omeprazole (PRILOSEC) 40 MG capsule, Take 1 capsule (40 mg total) by mouth daily., Disp: 30 capsule, Rfl: 3 .  OXYGEN, Inhale 2 L into the lungs continuous. , Disp: , Rfl:  .  polyethylene glycol (MIRALAX / GLYCOLAX) packet, Take 17 g by mouth daily with breakfast. , Disp: , Rfl:  .  potassium chloride SA (K-DUR,KLOR-CON) 20 MEQ tablet, Take 20 mEq by mouth daily with breakfast. , Disp: , Rfl:  .  Propylene Glycol (SYSTANE BALANCE) 0.6 % SOLN, Place 1 drop into both eyes 2 (two) times daily. , Disp: , Rfl:  .  rivaroxaban (XARELTO) 20 MG TABS tablet, Take 20 mg by mouth daily with supper., Disp: , Rfl:  .  senna-docusate (SENOKOT-S) 8.6-50 MG tablet, Take 1 tablet by mouth 2 (two) times daily., Disp: , Rfl:  .  Skin Protectants, Misc. (CALAZIME SKIN PROTECTANT EX), Apply 1 application topically daily. Apply to sacrum and right buttock every day, Disp: , Rfl:  .  tamsulosin (FLOMAX) 0.4 MG CAPS capsule, Take 1 capsule (0.4 mg total) by mouth daily after supper., Disp: 90 capsule, Rfl: 1 .  traMADol (ULTRAM) 50 MG tablet, Take 1 tablet (50 mg total) by mouth 2 (two) times daily., Disp: 60 tablet, Rfl: 5 .  traZODone (DESYREL) 100 MG tablet, Take 100 mg by mouth at bedtime. , Disp: , Rfl:  .  Vitamin D, Ergocalciferol, (DRISDOL) 50000 units CAPS capsule, Take 1 capsule (50,000 Units total) by mouth every 7 (seven) days., Disp: 30 capsule, Rfl: prn .  Vitamins A & D (VITAMIN A & D) ointment, Apply 1 application topically 2 (two) times daily as needed for dry skin., Disp: , Rfl:     Review of Systems  Unable to perform ROS: Dementia   He was able to answer  some questions but I could not take a reliable comprehsensive ROS    Objective:   Physical Exam  Constitutional: He appears well-nourished. No distress.  HENT:  Head: Normocephalic and atraumatic.  Mouth/Throat: Oropharynx is clear and moist. No oropharyngeal exudate.  Eyes: Conjunctivae and EOM are  normal. No scleral icterus.  Neck: Normal range of motion. Neck supple.  Cardiovascular: Normal rate and regular rhythm.   Pulmonary/Chest: Effort normal. No respiratory distress. He has no wheezes.  Abdominal: Soft. He exhibits no distension.  Musculoskeletal: He exhibits edema and deformity. He exhibits no tenderness.  Neurological: He is alert. He exhibits normal muscle tone.  Oriented to [facility] and being near [place]  Skin: Skin is warm and dry. No rash noted. He is not diaphoretic. No erythema. No pallor.  Psychiatric: He has a normal mood and affect. His behavior is normal. Judgment and thought content normal.   Facial rash  09/30/16:     Left AKA site 09/30/16:    Right heel where he has osteo underlying this 09/30/16:           Assessment & Plan:    Right calcaneal osteomyelitis:  This type of infection is NOT typically curable with antibiotics  IF we had a deep bone culture OFF antibiotics--of which I see no documentation of having happened THEN this could guide systemic antibiotics  If this was MRSA deep in the bone a levaquin and FQ are poor for this as MRSA develops FQ resistance rapidly esp with osteomyelitis  I am VERY SKEPTICAL of patients in general and especially patients such as this man with known PVD to cure a calcaneal osteo absent BKA, vs AKA  I am stopping his levaquin  I would recommend he be seen by [facility] to evaluate for bone biopsy that can be followed by IV abx that we could arrange vs a curative surgery (BKA vs AKA)  I would like him OFF abx for deep bone biopsy culturess  The patient did not want amputation but he is NOT weight bearing. He has prosthetic hip and he will be at risk for dissemination of infection from the osteo to blood stream to seed his prosthesis cause sepsis and death  His wife will need to be involved in the conversations as well. She did nto accompany him to clinic today  I will cc [facility] first. If they do not want  to be the drivers of surgery would refer to Orthopedics  #2 Facial rash: this is not especially classic for MRSA. I would have him see Dermatology in the interim he can try topical antibiotic with mupirocin.  I spent greater than 60 minutes with the patient including greater than 50% of time in face to face counsel of the patient re his osteomyelitis, facial rash, PVD  and in coordination of his care.

## 2016-09-30 NOTE — Telephone Encounter (Signed)
Attempted to call [facility] skilled nursing (the phone # listed under care teams). No answer and no voice mail.

## 2016-09-30 NOTE — Telephone Encounter (Signed)
-----   Message from Mena Goes, RN sent at 09/30/2016  1:43 PM EDT ----- Regarding: Dr. Donzetta Matters on Friday Please move the 9:15 protocol pt off [place] and put her on Suzanne's list at 9:15.  Then add this man, he's an old [place] pt.  ----- Message ----- From: Conrad [place], MD Sent: 09/30/2016  12:42 PM To: [address]  JOVE BEYL [phone] 1940-09-29  Pt reportedly has R heel osteomyelitis.  He needs to see next available provider to evaluate for possible R BKA vs AKA.

## 2016-10-03 ENCOUNTER — Ambulatory Visit (INDEPENDENT_AMBULATORY_CARE_PROVIDER_SITE_OTHER): Payer: Medicare Other | Admitting: Vascular Surgery

## 2016-10-03 ENCOUNTER — Encounter: Payer: Self-pay | Admitting: Vascular Surgery

## 2016-10-03 VITALS — BP 79/58 | HR 82 | Temp 98.0°F | Resp 18 | Ht 67.0 in | Wt 230.0 lb

## 2016-10-03 DIAGNOSIS — I779 Disorder of arteries and arterioles, unspecified: Secondary | ICD-10-CM

## 2016-10-03 DIAGNOSIS — M869 Osteomyelitis, unspecified: Secondary | ICD-10-CM

## 2016-10-03 DIAGNOSIS — E1169 Type 2 diabetes mellitus with other specified complication: Secondary | ICD-10-CM | POA: Diagnosis not present

## 2016-10-03 NOTE — Progress Notes (Signed)
Patient ID: [name], male   DOB: 22-Aug-1940, 76 y.o.   MRN: [id number]  Reason for Consult: Re-evaluation (eval for poss R BKA vs AKA)   Referred by No ref. provider found  Subjective:     HPI:  [name] is a 76 y.o. male with history of left aka for non-reconstructable vascular disease. He is now mostly bedbound. 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 He complains of pain everywhere and also in his left aka site. He denies any recent fevers.   Past Medical History:  Diagnosis Date  . Acute osteomyelitis of calcaneum, right ([place]) 09/30/2016  . Adhesive capsulitis   . Angina   . Anxiety   . CAD (coronary artery disease)   . CHF (congestive heart failure) ([place])   . Chronic back pain   . Chronic kidney disease    hx of BPH  . COPD (chronic obstructive pulmonary disease) ([place])   . CVA (cerebral infarction) Questionable history  . Depression   . Diabetes mellitus   . Diverticulosis   . Facial rash 09/30/2016  . Falls frequently 06/2014  . GERD (gastroesophageal reflux disease)   . Hyperlipidemia   . Hypertension   . 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 Myocardial infarction ([place])   . Neuromuscular disorder ([place])    HX of diabetic periferal neuropathy  . On home oxygen therapy    "1.5L prn" (04/12/2014)  . Osteomyelitis ([place]) 2012   Sternoclavicular joint   . PE (pulmonary embolism) 10-15 years ago   Lifelong Coumadin  . Peripheral vascular disease ([place])   . Shortness of breath   . Sleep apnea    Family History  Problem Relation Age of Onset  . Heart disease Father   . Heart disease Mother    Past Surgical History:  Procedure Laterality Date  . AMPUTATION  03/16/2011   Procedure: AMPUTATION DIGIT;  Surgeon: Angelia Mould, MD;  Location: [facility] OR;  Service: Vascular;  Laterality: Left;  Great toe  . AMPUTATION Left 11/08/2015   Procedure: AMPUTATION  ABOVE KNEE LEFT;  Surgeon: Waynetta Sandy, MD;  Location: [facility];  Service: Vascular;  Laterality: Left;  . CARDIAC CATHETERIZATION  October 18, 2010   Known obstructive disease, no further blockages  . CARDIAC CATHETERIZATION  03/17/2005   EF 35-40%  . COLONOSCOPY  2006    Multiple polyps removed, repeat in 5 years  . CORONARY ARTERY BYPASS GRAFT  1992  . [place], 2002  . DOPPLER ECHOCARDIOGRAPHY  Sept 2011   EF 50-55% with some impaired diastolic relaxation  . FEMORAL-POPLITEAL BYPASS GRAFT  2008   Left  . FRACTURE SURGERY  1980s   Hip  . HIP ARTHROPLASTY Left   . PERIPHERAL VASCULAR CATHETERIZATION N/A 11/06/2015   Procedure: Abdominal Aortogram;  Surgeon: Serafina Mitchell, MD;  Location: [facility];  Service: Cardiovascular;  Laterality: N/A;  . PERIPHERAL VASCULAR CATHETERIZATION Bilateral 11/06/2015   Procedure: Lower Extremity Angiography;  Surgeon: Serafina Mitchell, MD;  Location: [facility];  Service: Cardiovascular;  Laterality: Bilateral;  . Right iliopopliteal bypass - [place]  . septic arthritis     Removal of infected CABG wire by Dr Arlyce Dice - 2011  . SPINE SURGERY  2002   Cervical fusion vertebroplasty C3-4-5  . US ECHOCARDIOGRAPHY  03/04/2006   EF  50-55%    Short Social History:  Social History  Substance Use Topics  . Smoking status: Former Smoker    Packs/day: 1.00    Quit date: 06/19/2000  . Smokeless tobacco: Never Used  . Alcohol use No    Allergies  Allergen Reactions  . Diazepam Anxiety    REACTION: makes patient cry  . Vancomycin Other (See Comments)    "red man syndrome"    Current Outpatient Prescriptions  Medication Sig Dispense Refill  . acetaminophen (TYLENOL) 325 MG tablet Take 650 mg by mouth every 6 (six) hours as needed for fever.    . Amino Acids-Protein Hydrolys (FEEDING SUPPLEMENT, PRO-STAT SUGAR FREE 64,) LIQD Take 30 mLs by mouth 2 (two) times daily. 900 mL 0  . aspirin EC 81 MG EC  tablet Take 1 tablet (81 mg total) by mouth daily.    Marland Kitchen atorvastatin (LIPITOR) 20 MG tablet Take 20 mg by mouth at bedtime.     . bisacodyl (DULCOLAX) 10 MG suppository Place 1 suppository (10 mg total) rectally daily as needed for moderate constipation. 12 suppository 0  . carvedilol (COREG) 3.125 MG tablet Take 3.125 mg by mouth 2 (two) times daily with a meal.     . cetirizine (ZYRTEC ALLERGY) 10 MG tablet Take 10 mg by mouth daily with breakfast.     . diclofenac sodium (VOLTAREN) 1 % GEL Apply 4 g topically 3 (three) times daily.    Marland Kitchen docusate sodium (COLACE) 100 MG capsule Take 100 mg by mouth 2 (two) times daily.    . DULoxetine (CYMBALTA) 30 MG capsule Take 90 mg by mouth daily with breakfast.     . ferrous sulfate 325 (65 FE) MG tablet Take 1 tablet (325 mg total) by mouth daily with breakfast. 30 tablet 0  . furosemide (LASIX) 80 MG tablet Take 1 tablet (80 mg total) by mouth 2 (two) times daily. 60 tablet 0  . gabapentin (NEURONTIN) 600 MG tablet Take 600 mg by mouth 3 (three) times daily.     Marland Kitchen guaiFENesin-dextromethorphan (ROBITUSSIN DM) 100-10 MG/5ML syrup Take 15 mLs by mouth every 4 (four) hours as needed for cough. 118 mL 0  . HYDROcodone-acetaminophen (NORCO/VICODIN) 5-325 MG tablet Take 1 tablet by mouth every 4 (four) hours as needed for moderate pain.    Marland Kitchen insulin lispro (HUMALOG) 100 UNIT/ML cartridge Inject 0-13 Units into the skin 3 (three) times daily with meals. Inject as per sliding scale: if  0-70=0 units; 71-149 = 8 units, 150-600= 13 units subcutaneously before meals related to DM.    Marland Kitchen insulin NPH Human (HUMULIN N,NOVOLIN N) 100 UNIT/ML injection Inject 20 Units into the skin 3 (three) times daily before meals.    . lubiprostone (AMITIZA) 8 MCG capsule Take 8 mcg by mouth 2 (two) times daily with a meal.    . methocarbamol (ROBAXIN) 500 MG tablet Take 500 mg by mouth at bedtime.    . Multiple Vitamins-Minerals (DECUBI-VITE PO) Take 1 capsule by mouth daily with  breakfast.     . mupirocin ointment (BACTROBAN) 2 % Place 1 application into the nose 2 (two) times daily. 30 g 2  . nitroGLYCERIN (NITROSTAT) 0.4 MG SL tablet Place 1 tablet (0.4 mg total) under the tongue as needed. For chest pains (Patient taking differently: Place 0.4 mg under the tongue every 5 (five) minutes as needed for chest pain. ) 30 tablet 6  . omeprazole (PRILOSEC) 40 MG capsule Take 1 capsule (40 mg total) by mouth daily.  30 capsule 3  . OXYGEN Inhale 2 L into the lungs continuous.     . polyethylene glycol (MIRALAX / GLYCOLAX) packet Take 17 g by mouth daily with breakfast.     . potassium chloride SA (K-DUR,KLOR-CON) 20 MEQ tablet Take 20 mEq by mouth daily with breakfast.     . Propylene Glycol (SYSTANE BALANCE) 0.6 % SOLN Place 1 drop into both eyes 2 (two) times daily.     . rivaroxaban (XARELTO) 20 MG TABS tablet Take 20 mg by mouth daily with supper.    . senna-docusate (SENOKOT-S) 8.6-50 MG tablet Take 1 tablet by mouth 2 (two) times daily.    . Skin Protectants, Misc. (CALAZIME SKIN PROTECTANT EX) Apply 1 application topically daily. Apply to sacrum and right buttock every day    . tamsulosin (FLOMAX) 0.4 MG CAPS capsule Take 1 capsule (0.4 mg total) by mouth daily after supper. 90 capsule 1  . traMADol (ULTRAM) 50 MG tablet Take 1 tablet (50 mg total) by mouth 2 (two) times daily. 60 tablet 5  . traZODone (DESYREL) 100 MG tablet Take 100 mg by mouth at bedtime.     . Vitamin D, Ergocalciferol, (DRISDOL) 50000 units CAPS capsule Take 1 capsule (50,000 Units total) by mouth every 7 (seven) days. 30 capsule prn  . Vitamins A & D (VITAMIN A & D) ointment Apply 1 application topically 2 (two) times daily as needed for dry skin.     No current facility-administered medications for this visit.     Review of Systems  Constitutional:  Constitutional negative. Respiratory: Respiratory negative.  Cardiovascular: Cardiovascular negative.  GI: Gastrointestinal negative.    Musculoskeletal: Positive for back pain, leg pain and joint pain.  Neurological: Neurological negative. Hematologic: Hematologic/lymphatic negative.  Psychiatric: Psychiatric negative.        Objective:  Objective   Vitals:   10/03/16 0921  BP: (!) 79/58  Pulse: 82  Resp: 18  Temp: 98 F (36.7 C)  TempSrc: Oral  SpO2: (!) 88%  Weight: 230 lb (104.3 kg)  Height: 5\' 7"  (1.702 m)   Body mass index is 36.02 kg/m.  Physical Exam  Constitutional: He is oriented to person, place, and time. No distress.  Cardiovascular: Normal rate.   Pulses:      Femoral pulses are 2+ on the right side. Abdominal: Soft. He exhibits no mass.  Musculoskeletal:  Right aka now healed  Neurological: He is alert and oriented to person, place, and time.  Skin:  Right lateral heel ulcer without purulence or erythema  Psychiatric:  Depressed mood     Assessment/Plan:     76 year old male with previous left lower extremity AKA now here for evaluation of right lateral heel ulcer with underlying osteomyelitis. There is some concern of seeding his previous prosthetic hip on that side. His main complaint is pain everywhere. At this time he seems completely bedbound and we discussed amputation on the right side but he does not want to pursue that. He is not on antibiotics but was followed previously by infectious disease. If he needs a bone biopsy would defer to orthopedics but I rather think that his choices should either be hospice or a palliative amputation. I called his wife Butch Penny and discussed this with her and she is concerned about his general declining health. If he agrees to right above-the-knee amputation we can schedule him without further evaluation but would need to hold xarelto for 48 hours prior.      Waynetta Sandy  MD [facility]

## 2016-10-13 ENCOUNTER — Encounter: Payer: Self-pay | Admitting: Family

## 2016-10-13 ENCOUNTER — Telehealth: Payer: Self-pay | Admitting: *Deleted

## 2016-10-13 NOTE — Telephone Encounter (Signed)
Faxed paperwork pn 10/10/2016 for Dr Tommy Medal.  Kristeen Miss asking that paperwork be reviewed, signed and faxed back.  RN informed Kristeen Miss that Dr Tommy Medal is not available to review the paperwork until 10/22/16 when he returns to the office.  RN will need to locate the paperwork and call Latisha.  Unable to reach [place] by phone at [facility] and [place]. [phone]).  Unable to locate paperwork faxed on 10/10/16.  Unable to ask that paperwork be refaxed to [facility].

## 2016-10-23 ENCOUNTER — Telehealth: Payer: Self-pay | Admitting: *Deleted

## 2016-10-23 NOTE — Telephone Encounter (Signed)
Thanks Michelle

## 2016-10-23 NOTE — Telephone Encounter (Signed)
-----   Message from Truman Hayward, MD sent at 10/22/2016  9:47 PM EDT ----- Thanks so much for seeing him. I completely agree. [facility] Triage pool can we make sure that patients PCP is aware? ----- Message ----- From: Waynetta Sandy, MD Sent: 10/03/2016  12:38 PM To: Truman Hayward, MD  I think Mr. Borowski needs either hospice or an amputation of his right leg. At this time he is not agreeable to amputation.   Erlene Quan

## 2016-10-23 NOTE — Telephone Encounter (Signed)
Spoke with [place] at [facility].  She stated that the patient is under the care of Salome Arnt, [place].  RN faxed information attention to Salome Arnt. Landis Gandy, RN

## 2016-10-24 ENCOUNTER — Ambulatory Visit (INDEPENDENT_AMBULATORY_CARE_PROVIDER_SITE_OTHER): Payer: Medicare Other | Admitting: Family

## 2016-10-24 ENCOUNTER — Encounter: Payer: Self-pay | Admitting: Family

## 2016-10-24 ENCOUNTER — Ambulatory Visit (INDEPENDENT_AMBULATORY_CARE_PROVIDER_SITE_OTHER)
Admission: RE | Admit: 2016-10-24 | Discharge: 2016-10-24 | Disposition: A | Discharge status: Home / Self Care | Payer: Medicare Other | Source: Ambulatory Visit | Attending: Family | Admitting: Family

## 2016-10-24 ENCOUNTER — Ambulatory Visit (HOSPITAL_COMMUNITY)
Admission: RE | Admit: 2016-10-24 | Discharge: 2016-10-24 | Disposition: A | Discharge status: Home / Self Care | Payer: Medicare Other | Source: Ambulatory Visit | Attending: Family | Admitting: Family

## 2016-10-24 VITALS — BP 116/66 | HR 83 | Temp 98.1°F | Resp 16 | Ht 67.0 in | Wt 230.0 lb

## 2016-10-24 DIAGNOSIS — I779 Disorder of arteries and arterioles, unspecified: Secondary | ICD-10-CM | POA: Diagnosis not present

## 2016-10-24 DIAGNOSIS — I6523 Occlusion and stenosis of bilateral carotid arteries: Secondary | ICD-10-CM

## 2016-10-24 DIAGNOSIS — Z9889 Other specified postprocedural states: Secondary | ICD-10-CM | POA: Diagnosis not present

## 2016-10-24 DIAGNOSIS — I6522 Occlusion and stenosis of left carotid artery: Secondary | ICD-10-CM

## 2016-10-24 DIAGNOSIS — I739 Peripheral vascular disease, unspecified: Secondary | ICD-10-CM | POA: Insufficient documentation

## 2016-10-24 DIAGNOSIS — M869 Osteomyelitis, unspecified: Secondary | ICD-10-CM

## 2016-10-24 DIAGNOSIS — E1169 Type 2 diabetes mellitus with other specified complication: Secondary | ICD-10-CM

## 2016-10-24 DIAGNOSIS — E1151 Type 2 diabetes mellitus with diabetic peripheral angiopathy without gangrene: Secondary | ICD-10-CM | POA: Diagnosis not present

## 2016-10-24 LAB — VAS US CAROTID
LCCAPDIAS: 22 cm/s
LCCAPSYS: 62 cm/s
LEFT ECA DIAS: -7 cm/s
LEFT VERTEBRAL DIAS: -12 cm/s
Left CCA dist dias: 14 cm/s
Left CCA dist sys: 52 cm/s
Left ICA dist dias: -17 cm/s
Left ICA dist sys: -35 cm/s
Left ICA prox dias: -30 cm/s
Left ICA prox sys: -63 cm/s
RCCAPSYS: 94 cm/s
RIGHT CCA MID DIAS: 24 cm/s
RIGHT ECA DIAS: 10 cm/s
RIGHT VERTEBRAL DIAS: -15 cm/s
Right CCA prox dias: 21 cm/s
Right cca dist sys: -90 cm/s

## 2016-10-24 NOTE — Patient Instructions (Signed)
Peripheral Vascular Disease Peripheral vascular disease (PVD) is a disease of the blood vessels that are not part of your heart and brain. A simple term for PVD is poor circulation. In most cases, PVD narrows the blood vessels that carry blood from your heart to the rest of your body. This can result in a decreased supply of blood to your arms, legs, and internal organs, like your stomach or kidneys. However, it most often affects a person's lower legs and feet. There are two types of PVD.  Organic PVD. This is the more common type. It is caused by damage to the structure of blood vessels.  Functional PVD. This is caused by conditions that make blood vessels contract and tighten (spasm). Without treatment, PVD tends to get worse over time. PVD can also lead to acute ischemic limb. This is when an arm or limb suddenly has trouble getting enough blood. This is a medical emergency. Follow these instructions at home:  Take medicines only as told by your doctor.  Do not use any tobacco products, including cigarettes, chewing tobacco, or electronic cigarettes. If you need help quitting, ask your doctor.  Lose weight if you are overweight, and maintain a healthy weight as told by your doctor.  Eat a diet that is low in fat and cholesterol. If you need help, ask your doctor.  Exercise regularly. Ask your doctor for some good activities for you.  Take good care of your feet.  Wear comfortable shoes that fit well.  Check your feet often for any cuts or sores. Contact a doctor if:  You have cramps in your legs while walking.  You have leg pain when you are at rest.  You have coldness in a leg or foot.  Your skin changes.  You are unable to get or have an erection (erectile dysfunction).  You have cuts or sores on your feet that are not healing. Get help right away if:  Your arm or leg turns cold and blue.  Your arms or legs become red, warm, swollen, painful, or numb.  You have  chest pain or trouble breathing.  You suddenly have weakness in your face, arm, or leg.  You become very confused or you cannot speak.  You suddenly have a very bad headache.  You suddenly cannot see. This information is not intended to replace advice given to you by your health care provider. Make sure you discuss any questions you have with your health care provider. Document Released: 06/04/2009 Document Revised: 08/16/2015 Document Reviewed: 08/18/2013 [employer] Interactive Patient Education  2017 [employer]    Stroke Prevention Some medical conditions and behaviors are associated with an increased chance of having a stroke. You may prevent a stroke by making healthy choices and managing medical conditions. How can I reduce my risk of having a stroke?  Stay physically active. Get at least 30 minutes of activity on most or all days.  Do not smoke. It may also be helpful to avoid exposure to secondhand smoke.  Limit alcohol use. Moderate alcohol use is considered to be:  No more than 2 drinks per day for men.  No more than 1 drink per day for nonpregnant women.  Eat healthy foods. This involves:  Eating 5 or more servings of fruits and vegetables a day.  Making dietary changes that address high blood pressure (hypertension), high cholesterol, diabetes, or obesity.  Manage your cholesterol levels.  Making food choices that are high in fiber and low in saturated fat,   trans fat, and cholesterol may control cholesterol levels.  Take any prescribed medicines to control cholesterol as directed by your health care provider.  Manage your diabetes.  Controlling your carbohydrate and sugar intake is recommended to manage diabetes.  Take any prescribed medicines to control diabetes as directed by your health care provider.  Control your hypertension.  Making food choices that are low in salt (sodium), saturated fat, trans fat, and cholesterol is recommended to manage  hypertension.  Ask your health care provider if you need treatment to lower your blood pressure. Take any prescribed medicines to control hypertension as directed by your health care provider.  If you are 18-39 years of age, have your blood pressure checked every 3-5 years. If you are 40 years of age or older, have your blood pressure checked every year.  Maintain a healthy weight.  Reducing calorie intake and making food choices that are low in sodium, saturated fat, trans fat, and cholesterol are recommended to manage weight.  Stop drug abuse.  Avoid taking birth control pills.  Talk to your health care provider about the risks of taking birth control pills if you are over 35 years old, smoke, get migraines, or have ever had a blood clot.  Get evaluated for sleep disorders (sleep apnea).  Talk to your health care provider about getting a sleep evaluation if you snore a lot or have excessive sleepiness.  Take medicines only as directed by your health care provider.  For some people, aspirin or blood thinners (anticoagulants) are helpful in reducing the risk of forming abnormal blood clots that can lead to stroke. If you have the irregular heart rhythm of atrial fibrillation, you should be on a blood thinner unless there is a good reason you cannot take them.  Understand all your medicine instructions.  Make sure that other conditions (such as anemia or atherosclerosis) are addressed. Get help right away if:  You have sudden weakness or numbness of the face, arm, or leg, especially on one side of the body.  Your face or eyelid droops to one side.  You have sudden confusion.  You have trouble speaking (aphasia) or understanding.  You have sudden trouble seeing in one or both eyes.  You have sudden trouble walking.  You have dizziness.  You have a loss of balance or coordination.  You have a sudden, severe headache with no known cause.  You have new chest pain or an  irregular heartbeat. Any of these symptoms may represent a serious problem that is an emergency. Do not wait to see if the symptoms will go away. Get medical help at once. Call your local emergency services (911 in [place] Do not drive yourself to the hospital. This information is not intended to replace advice given to you by your health care provider. Make sure you discuss any questions you have with your health care provider. Document Released: 04/17/2004 Document Revised: 08/16/2015 Document Reviewed: 09/10/2012 [employer] Interactive Patient Education  2017 [employer]   

## 2016-10-24 NOTE — Progress Notes (Signed)
[facility] [place] HISTORY AND PHYSICAL   MRN : [id number]  History of Present Illness:   [name] is a 76 y.o. 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 Donzetta Matters for gangrene of left foot. He is now mostly bedbound. 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 He complains of pain everywhere and also in his right leg. He denies fevers or chills.   Dr. Donzetta Matters last evaluated pt on 10-03-16. At that time Dr. Donzetta Matters evaluated pt for right lateral heel ulcer with underlying osteomyelitis. There was some concern of seeding his previous prosthetic hip on that side. His main complaint was pain everywhere. At that time he seemed completely bedbound and Dr. Donzetta Matters discussed amputation on the right side but pt did not want to pursue that. He was not on antibiotics but was followed previously by infectious disease. If he needs a bone biopsy would defer to orthopedics but (Dr. Donzetta Matters) rather think that his choices should either be hospice or a palliative amputation. Dr. Donzetta Matters called his wife [name] and discussed this with her and she was concerned about his general declining health. If he agrees to right above-the-knee amputation we can schedule him without further evaluation but would need to hold xarelto for 48 hours prior.  He is a resident of [facility] nursing facility. He is on anticoagulation and does not ambulate. Both legs have chronically been swollen for years. His memory is limited and he is unable to detail his prior operations. This patient has had known PE, he is on Xarelto. Atherosclerotic risk factors include: DM, CAD with hx of CABG x 2 separate surgeries, CHF, HTN, morbid obesity, chronic pain with use of analgesics, sedentary lifestyle with limited ability to walk, and prior smoking.  After review of his old office charts by Dr. Bridgett Larsson: 1. L pop artery aneurysm resection, L tb TE, L fem-pop bypass to tib BPG w/ cephalic  vein with Dr. Justine Null (3/31/8) 2. L CEA w/ Dr. Scot Dock (12/13/97) 3. L EIA to pop vein bypass by Dr. Truman Hayward (06/15/81) 4. Left 1st ray amputation by Dr. Scot Dock (03/16/11)  This patient was lost to follow up for a time due his insistence on terminating surveillance.   Dr. Bridgett Larsson last evaluated pt on 08/03/15. At that time pt presented with: BLE CVI (C6), known LLE PAD likely moderate to severe This patient appears to have multiple active high grade medical problems.  I doubt he would be a candidate for any further surgical interventions.  I would focus on wound care in this patient primarily. Would start with dry dressing to both calf wounds and apply ACE bandages.  I have ordered BLE ABI and BLE venous reflux duplexes to determine severity of his arterial and venous disease. His arterial disease will likely be under diagnosed with the ABI given his known DM.    Pt Diabetic: Yes Pt smoker: former smoker, quit in 1992   Pt meds include: Statin :Yes Betablocker: Yes ASA: No Other anticoagulants/antiplatelets: Xarelto   Current Outpatient Prescriptions  Medication Sig Dispense Refill  . acetaminophen (TYLENOL) 325 MG tablet Take 650 mg by mouth every 6 (six) hours as needed for fever.    . Amino Acids-Protein Hydrolys (FEEDING SUPPLEMENT, PRO-STAT SUGAR FREE 64,) LIQD Take 30 mLs by mouth 2 (two) times daily. 900 mL 0  . aspirin EC 81 MG EC tablet Take 1 tablet (81 mg total) by mouth daily.    Marland Kitchen  atorvastatin (LIPITOR) 20 MG tablet Take 20 mg by mouth at bedtime.     . bisacodyl (DULCOLAX) 10 MG suppository Place 1 suppository (10 mg total) rectally daily as needed for moderate constipation. 12 suppository 0  . carvedilol (COREG) 3.125 MG tablet Take 3.125 mg by mouth 2 (two) times daily with a meal.     . cetirizine (ZYRTEC ALLERGY) 10 MG tablet Take 10 mg by mouth daily with breakfast.     . diclofenac sodium (VOLTAREN) 1 % GEL Apply 4 g topically 3 (three) times daily.    Marland Kitchen  docusate sodium (COLACE) 100 MG capsule Take 100 mg by mouth 2 (two) times daily.    . DULoxetine (CYMBALTA) 30 MG capsule Take 90 mg by mouth daily with breakfast.     . ferrous sulfate 325 (65 FE) MG tablet Take 1 tablet (325 mg total) by mouth daily with breakfast. 30 tablet 0  . furosemide (LASIX) 80 MG tablet Take 1 tablet (80 mg total) by mouth 2 (two) times daily. 60 tablet 0  . gabapentin (NEURONTIN) 600 MG tablet Take 600 mg by mouth 3 (three) times daily.     Marland Kitchen guaiFENesin-dextromethorphan (ROBITUSSIN DM) 100-10 MG/5ML syrup Take 15 mLs by mouth every 4 (four) hours as needed for cough. 118 mL 0  . HYDROcodone-acetaminophen (NORCO/VICODIN) 5-325 MG tablet Take 1 tablet by mouth every 4 (four) hours as needed for moderate pain.    Marland Kitchen insulin lispro (HUMALOG) 100 UNIT/ML cartridge Inject 0-13 Units into the skin 3 (three) times daily with meals. Inject as per sliding scale: if  0-70=0 units; 71-149 = 8 units, 150-600= 13 units subcutaneously before meals related to DM.    Marland Kitchen insulin NPH Human (HUMULIN N,NOVOLIN N) 100 UNIT/ML injection Inject 20 Units into the skin 3 (three) times daily before meals.    . lubiprostone (AMITIZA) 8 MCG capsule Take 8 mcg by mouth 2 (two) times daily with a meal.    . methocarbamol (ROBAXIN) 500 MG tablet Take 500 mg by mouth at bedtime.    . Multiple Vitamins-Minerals (DECUBI-VITE PO) Take 1 capsule by mouth daily with breakfast.     . mupirocin ointment (BACTROBAN) 2 % Place 1 application into the nose 2 (two) times daily. 30 g 2  . nitroGLYCERIN (NITROSTAT) 0.4 MG SL tablet Place 1 tablet (0.4 mg total) under the tongue as needed. For chest pains (Patient taking differently: Place 0.4 mg under the tongue every 5 (five) minutes as needed for chest pain. ) 30 tablet 6  . omeprazole (PRILOSEC) 40 MG capsule Take 1 capsule (40 mg total) by mouth daily. 30 capsule 3  . OXYGEN Inhale 2 L into the lungs continuous.     . polyethylene glycol (MIRALAX / GLYCOLAX) packet  Take 17 g by mouth daily with breakfast.     . potassium chloride SA (K-DUR,KLOR-CON) 20 MEQ tablet Take 20 mEq by mouth daily with breakfast.     . Propylene Glycol (SYSTANE BALANCE) 0.6 % SOLN Place 1 drop into both eyes 2 (two) times daily.     . rivaroxaban (XARELTO) 20 MG TABS tablet Take 20 mg by mouth daily with supper.    . senna-docusate (SENOKOT-S) 8.6-50 MG tablet Take 1 tablet by mouth 2 (two) times daily.    . Skin Protectants, Misc. (CALAZIME SKIN PROTECTANT EX) Apply 1 application topically daily. Apply to sacrum and right buttock every day    . tamsulosin (FLOMAX) 0.4 MG CAPS capsule Take 1 capsule (0.4 mg  total) by mouth daily after supper. 90 capsule 1  . traMADol (ULTRAM) 50 MG tablet Take 1 tablet (50 mg total) by mouth 2 (two) times daily. 60 tablet 5  . traZODone (DESYREL) 100 MG tablet Take 100 mg by mouth at bedtime.     . Vitamin D, Ergocalciferol, (DRISDOL) 50000 units CAPS capsule Take 1 capsule (50,000 Units total) by mouth every 7 (seven) days. 30 capsule prn  . Vitamins A & D (VITAMIN A & D) ointment Apply 1 application topically 2 (two) times daily as needed for dry skin.     No current facility-administered medications for this visit.     Past Medical History:  Diagnosis Date  . Acute osteomyelitis of calcaneum, right ([place]) 09/30/2016  . Adhesive capsulitis   . Angina   . Anxiety   . CAD (coronary artery disease)   . CHF (congestive heart failure) ([place])   . Chronic back pain   . Chronic kidney disease    hx of BPH  . COPD (chronic obstructive pulmonary disease) ([place])   . CVA (cerebral infarction) Questionable history  . Depression   . Diabetes mellitus   . Diverticulosis   . Facial rash 09/30/2016  . Falls frequently 06/2014  . GERD (gastroesophageal reflux disease)   . Hyperlipidemia   . Hypertension   . 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 Myocardial infarction ([place])   . Neuromuscular disorder ([place])     HX of diabetic periferal neuropathy  . On home oxygen therapy    "1.5L prn" (04/12/2014)  . Osteomyelitis ([place]) 2012   Sternoclavicular joint   . PE (pulmonary embolism) 10-15 years ago   Lifelong Coumadin  . Peripheral vascular disease ([place])   . Shortness of breath   . Sleep apnea     Social History Social History  Substance Use Topics  . Smoking status: Former Smoker    Packs/day: 1.00    Quit date: 06/19/2000  . Smokeless tobacco: Never Used  . Alcohol use No    Family History Family History  Problem Relation Age of Onset  . Heart disease Father   . Heart disease Mother     Surgical History Past Surgical History:  Procedure Laterality Date  . AMPUTATION  03/16/2011   Procedure: AMPUTATION DIGIT;  Surgeon: Angelia Mould, MD;  Location: [facility] OR;  Service: Vascular;  Laterality: Left;  Great toe  . AMPUTATION Left 11/08/2015   Procedure: AMPUTATION ABOVE KNEE LEFT;  Surgeon: Waynetta Sandy, MD;  Location: [place];  Service: Vascular;  Laterality: Left;  . CARDIAC CATHETERIZATION  October 18, 2010   Known obstructive disease, no further blockages  . CARDIAC CATHETERIZATION  03/17/2005   EF 35-40%  . COLONOSCOPY  2006    Multiple polyps removed, repeat in 5 years  . CORONARY ARTERY BYPASS GRAFT  1992  . [place], 2002  . DOPPLER ECHOCARDIOGRAPHY  Sept 2011   EF 50-55% with some impaired diastolic relaxation  . FEMORAL-POPLITEAL BYPASS GRAFT  2008   Left  . FRACTURE SURGERY  1980s   Hip  . HIP ARTHROPLASTY Left   . PERIPHERAL VASCULAR CATHETERIZATION N/A 11/06/2015   Procedure: Abdominal Aortogram;  Surgeon: Serafina Mitchell, MD;  Location: [place] [facility];  Service: Cardiovascular;  Laterality: N/A;  . PERIPHERAL VASCULAR CATHETERIZATION Bilateral 11/06/2015   Procedure: Lower Extremity Angiography;  Surgeon: Serafina Mitchell, MD;  Location: [place] [facility];  Service: Cardiovascular;  Laterality: Bilateral;  . Right iliopopliteal  bypass - [place]  . septic arthritis     Removal of infected CABG wire by Dr Arlyce Dice - 2011  . SPINE SURGERY  2002   Cervical fusion vertebroplasty C3-4-5  . US ECHOCARDIOGRAPHY  03/04/2006   EF 50-55%    Allergies  Allergen Reactions  . Diazepam Anxiety    REACTION: makes patient cry  . Vancomycin Other (See Comments)    "red man syndrome"    Current Outpatient Prescriptions  Medication Sig Dispense Refill  . acetaminophen (TYLENOL) 325 MG tablet Take 650 mg by mouth every 6 (six) hours as needed for fever.    . Amino Acids-Protein Hydrolys (FEEDING SUPPLEMENT, PRO-STAT SUGAR FREE 64,) LIQD Take 30 mLs by mouth 2 (two) times daily. 900 mL 0  . aspirin EC 81 MG EC tablet Take 1 tablet (81 mg total) by mouth daily.    Marland Kitchen atorvastatin (LIPITOR) 20 MG tablet Take 20 mg by mouth at bedtime.     . bisacodyl (DULCOLAX) 10 MG suppository Place 1 suppository (10 mg total) rectally daily as needed for moderate constipation. 12 suppository 0  . carvedilol (COREG) 3.125 MG tablet Take 3.125 mg by mouth 2 (two) times daily with a meal.     . cetirizine (ZYRTEC ALLERGY) 10 MG tablet Take 10 mg by mouth daily with breakfast.     . diclofenac sodium (VOLTAREN) 1 % GEL Apply 4 g topically 3 (three) times daily.    Marland Kitchen docusate sodium (COLACE) 100 MG capsule Take 100 mg by mouth 2 (two) times daily.    . DULoxetine (CYMBALTA) 30 MG capsule Take 90 mg by mouth daily with breakfast.     . ferrous sulfate 325 (65 FE) MG tablet Take 1 tablet (325 mg total) by mouth daily with breakfast. 30 tablet 0  . furosemide (LASIX) 80 MG tablet Take 1 tablet (80 mg total) by mouth 2 (two) times daily. 60 tablet 0  . gabapentin (NEURONTIN) 600 MG tablet Take 600 mg by mouth 3 (three) times daily.     Marland Kitchen guaiFENesin-dextromethorphan (ROBITUSSIN DM) 100-10 MG/5ML syrup Take 15 mLs by mouth every 4 (four) hours as needed for cough. 118 mL 0  . HYDROcodone-acetaminophen (NORCO/VICODIN) 5-325 MG tablet Take 1 tablet by  mouth every 4 (four) hours as needed for moderate pain.    Marland Kitchen insulin lispro (HUMALOG) 100 UNIT/ML cartridge Inject 0-13 Units into the skin 3 (three) times daily with meals. Inject as per sliding scale: if  0-70=0 units; 71-149 = 8 units, 150-600= 13 units subcutaneously before meals related to DM.    Marland Kitchen insulin NPH Human (HUMULIN N,NOVOLIN N) 100 UNIT/ML injection Inject 20 Units into the skin 3 (three) times daily before meals.    . lubiprostone (AMITIZA) 8 MCG capsule Take 8 mcg by mouth 2 (two) times daily with a meal.    . methocarbamol (ROBAXIN) 500 MG tablet Take 500 mg by mouth at bedtime.    . Multiple Vitamins-Minerals (DECUBI-VITE PO) Take 1 capsule by mouth daily with breakfast.     . mupirocin ointment (BACTROBAN) 2 % Place 1 application into the nose 2 (two) times daily. 30 g 2  . nitroGLYCERIN (NITROSTAT) 0.4 MG SL tablet Place 1 tablet (0.4 mg total) under the tongue as needed. For chest pains (Patient taking differently: Place 0.4 mg under the tongue every 5 (five) minutes as needed for chest pain. ) 30 tablet 6  . omeprazole (PRILOSEC)  40 MG capsule Take 1 capsule (40 mg total) by mouth daily. 30 capsule 3  . OXYGEN Inhale 2 L into the lungs continuous.     . polyethylene glycol (MIRALAX / GLYCOLAX) packet Take 17 g by mouth daily with breakfast.     . potassium chloride SA (K-DUR,KLOR-CON) 20 MEQ tablet Take 20 mEq by mouth daily with breakfast.     . Propylene Glycol (SYSTANE BALANCE) 0.6 % SOLN Place 1 drop into both eyes 2 (two) times daily.     . rivaroxaban (XARELTO) 20 MG TABS tablet Take 20 mg by mouth daily with supper.    . senna-docusate (SENOKOT-S) 8.6-50 MG tablet Take 1 tablet by mouth 2 (two) times daily.    . Skin Protectants, Misc. (CALAZIME SKIN PROTECTANT EX) Apply 1 application topically daily. Apply to sacrum and right buttock every day    . tamsulosin (FLOMAX) 0.4 MG CAPS capsule Take 1 capsule (0.4 mg total) by mouth daily after supper. 90 capsule 1  .  traMADol (ULTRAM) 50 MG tablet Take 1 tablet (50 mg total) by mouth 2 (two) times daily. 60 tablet 5  . traZODone (DESYREL) 100 MG tablet Take 100 mg by mouth at bedtime.     . Vitamin D, Ergocalciferol, (DRISDOL) 50000 units CAPS capsule Take 1 capsule (50,000 Units total) by mouth every 7 (seven) days. 30 capsule prn  . Vitamins A & D (VITAMIN A & D) ointment Apply 1 application topically 2 (two) times daily as needed for dry skin.     No current facility-administered medications for this visit.      REVIEW OF SYSTEMS: See HPI for pertinent positives and negatives.  Physical Examination Vitals:   10/24/16 1519 10/24/16 1523  BP: 121/65 116/66  Pulse: 83   Resp: 16   Temp: 98.1 F (36.7 C)   TempSrc: Oral   SpO2: 94%   Weight: 230 lb (104.3 kg)   Height: 5\' 7"  (1.702 m)    Body mass index is 36.02 kg/m.   General: A&O x 3, morbidly obese male supine on transport stretcher.   Head: [place]/AT  Eyes: PERRLA  Neck: Supple,no palpable LAD, soft mass at right posterior neck, appearance of lipoma.  Pulmonary: Sym exp, respirations are non labored at rest, limited air movement.  Abdomen:  soft, NTND, -G/R, - HSM, - palpable masses, large panus, - CVAT B  Skin:  See M/S exam for extremity exam, mild facial seborrheic dermatitis.  VASCULAR EXAM  Carotid Bruits Right Left   Positive Positive      Radial pulses are faintly palpable bilaterally   Adominal aortic pulse is not palpable                      VASCULAR EXAM: Extremities without ischemic changes, without Gangrene; with open wound: right lateral heel ulcer that has signs of contracting and granulation, about 1.2 cm x 0.6 cm, shallow, no erythema, no drainage. Beefy red tissue at shallow base. Left AKA stump is well healed with what appears to be a small shallow sheared area of skin in the center. Right lower leg with trace pitting and non pitting edema.    Right lateral heel ulcer   Right leg  LE Pulses Right Left       FEMORAL  Not Palpable due to pannus overlying groin  Not Palpable due to pannus overlying groin        POPLITEAL  not palpable   AKA       POSTERIOR TIBIAL  not palpable  AKA        DORSALIS PEDIS      ANTERIOR TIBIAL not palpable  AKA      Musculoskeletal: M/S 4/5 BUE, limited PROM testing in both legs due to discomfort with manipulation. See Extremities.   Neurologic:  A&O X 2?; appropriate affect, sensation is normal; speech is normal, CN 2-12 grossly intact, pain and light touch intact in extremities, motor exam as listed above.  Psychiatric: Judgment intact, Mood & affect appropriate for pt's clinical situation, limited memory.      ASSESSMENT:  [name] is a 76 y.o. 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  He is also s/p: 1. L pop artery aneurysm resection, L tb TE, L fem-pop bypass to tib BPG w/ cephalic vein with Dr. Justine Null (3/31/8) 2. L CEA w/ Dr. Scot Dock (12/13/97) 3. L EIA to pop vein bypass by Dr. Truman Hayward (06/15/81) 4. 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  He has multiple high grade medical problems.  Dr. Donzetta Matters spoke with and examined pt.   DATA  Carotid Duplex (10/24/16): Right ICA: 1-39% stenosis. Left ICA: CEA site with no restenosis.  Bilateral vertebral artery flow is antegrade.  Bilateral subclavian artery waveforms are normal.  No significant change compared to the previous exam on 10-19-15.    ABI (Date: 10/24/2016):  R:   ABI: 1.28 (1.29 on 08-07-15),   PT: mono  DP: mono  TBI:  0.21   L: AKA  Right ankle pressure is unreliable due to lack of correlation with monophasic waveforms. Right TBI is abnormal at 0.21, toe pressure is low at 29. Left AKA.     PLAN:   Based on today's exam and non-invasive vascular lab results, the patient will follow up in 1 year with right ABI and carotid duplex, wound  care to be managed by the nursing facility or the wound care center.   The patient was given information about stroke prevention and what symptoms should prompt the patient to seek immediate medical care.  The patient was given information about PAD including signs, symptoms, treatment, what symptoms should prompt the patient to seek immediate medical care, and risk reduction measures to take.  Thank you for allowing [place] to participate in this patient's care.  Clemon Chambers, RN, MSN, FNP-C [facility] Office: [phone]  Clinic MD: Donzetta Matters 10/24/2016 3:28 PM

## 2016-10-27 NOTE — Addendum Note (Signed)
Addended by: Lianne Cure A on: 10/27/2016 03:38 PM   Modules accepted: Orders

## 2016-11-10 ENCOUNTER — Other Ambulatory Visit (HOSPITAL_COMMUNITY): Payer: Self-pay | Admitting: Family Medicine

## 2016-11-10 DIAGNOSIS — R109 Unspecified abdominal pain: Secondary | ICD-10-CM

## 2016-11-13 ENCOUNTER — Ambulatory Visit (HOSPITAL_COMMUNITY)
Admission: RE | Admit: 2016-11-13 | Discharge: 2016-11-13 | Disposition: A | Discharge status: Home / Self Care | Payer: Medicare Other | Source: Ambulatory Visit | Attending: Family Medicine | Admitting: Family Medicine

## 2016-11-13 ENCOUNTER — Encounter (HOSPITAL_COMMUNITY): Payer: Self-pay

## 2016-11-13 DIAGNOSIS — R1909 Other intra-abdominal and pelvic swelling, mass and lump: Secondary | ICD-10-CM | POA: Insufficient documentation

## 2016-11-13 DIAGNOSIS — R109 Unspecified abdominal pain: Secondary | ICD-10-CM | POA: Diagnosis not present

## 2016-11-13 MED ORDER — IOPAMIDOL (ISOVUE-300) INJECTION 61%
INTRAVENOUS | Status: AC
  Administered 2016-11-13: 100 mL via INTRAVENOUS
  Filled 2016-11-13: qty 100

## 2016-11-13 MED ORDER — IOPAMIDOL (ISOVUE-300) INJECTION 61%
100.0000 mL | Freq: Once | INTRAVENOUS | Status: AC | PRN
  Administered 2016-11-13: 100 mL via INTRAVENOUS

## 2016-12-03 ENCOUNTER — Emergency Department (HOSPITAL_COMMUNITY): Payer: Medicare Other

## 2016-12-03 ENCOUNTER — Inpatient Hospital Stay (HOSPITAL_COMMUNITY)
Admission: EM | Admit: 2016-12-03 | Discharge: 2016-12-11 | DRG: 871 | Disposition: A | Discharge status: Skilled Nursing Facility | Payer: Medicare Other | Attending: Internal Medicine | Admitting: Internal Medicine

## 2016-12-03 ENCOUNTER — Encounter: Payer: Self-pay | Admitting: Infectious Disease

## 2016-12-03 ENCOUNTER — Ambulatory Visit (INDEPENDENT_AMBULATORY_CARE_PROVIDER_SITE_OTHER): Payer: Medicare Other | Admitting: Infectious Disease

## 2016-12-03 ENCOUNTER — Encounter (HOSPITAL_COMMUNITY): Payer: Self-pay | Admitting: Emergency Medicine

## 2016-12-03 DIAGNOSIS — R21 Rash and other nonspecific skin eruption: Secondary | ICD-10-CM

## 2016-12-03 DIAGNOSIS — D72829 Elevated white blood cell count, unspecified: Secondary | ICD-10-CM

## 2016-12-03 DIAGNOSIS — I2510 Atherosclerotic heart disease of native coronary artery without angina pectoris: Secondary | ICD-10-CM | POA: Diagnosis present

## 2016-12-03 DIAGNOSIS — F419 Anxiety disorder, unspecified: Secondary | ICD-10-CM | POA: Diagnosis present

## 2016-12-03 DIAGNOSIS — A419 Sepsis, unspecified organism: Secondary | ICD-10-CM | POA: Diagnosis not present

## 2016-12-03 DIAGNOSIS — Z7901 Long term (current) use of anticoagulants: Secondary | ICD-10-CM

## 2016-12-03 DIAGNOSIS — E669 Obesity, unspecified: Secondary | ICD-10-CM | POA: Diagnosis present

## 2016-12-03 DIAGNOSIS — J449 Chronic obstructive pulmonary disease, unspecified: Secondary | ICD-10-CM | POA: Diagnosis present

## 2016-12-03 DIAGNOSIS — R52 Pain, unspecified: Secondary | ICD-10-CM

## 2016-12-03 DIAGNOSIS — Z7401 Bed confinement status: Secondary | ICD-10-CM

## 2016-12-03 DIAGNOSIS — I959 Hypotension, unspecified: Secondary | ICD-10-CM

## 2016-12-03 DIAGNOSIS — Z955 Presence of coronary angioplasty implant and graft: Secondary | ICD-10-CM

## 2016-12-03 DIAGNOSIS — M86171 Other acute osteomyelitis, right ankle and foot: Secondary | ICD-10-CM | POA: Diagnosis not present

## 2016-12-03 DIAGNOSIS — N189 Chronic kidney disease, unspecified: Secondary | ICD-10-CM | POA: Diagnosis present

## 2016-12-03 DIAGNOSIS — L97429 Non-pressure chronic ulcer of left heel and midfoot with unspecified severity: Secondary | ICD-10-CM | POA: Diagnosis present

## 2016-12-03 DIAGNOSIS — E785 Hyperlipidemia, unspecified: Secondary | ICD-10-CM | POA: Diagnosis present

## 2016-12-03 DIAGNOSIS — N3000 Acute cystitis without hematuria: Secondary | ICD-10-CM | POA: Diagnosis not present

## 2016-12-03 DIAGNOSIS — Z79899 Other long term (current) drug therapy: Secondary | ICD-10-CM

## 2016-12-03 DIAGNOSIS — R6521 Severe sepsis with septic shock: Secondary | ICD-10-CM | POA: Diagnosis present

## 2016-12-03 DIAGNOSIS — I739 Peripheral vascular disease, unspecified: Secondary | ICD-10-CM | POA: Diagnosis present

## 2016-12-03 DIAGNOSIS — D649 Anemia, unspecified: Secondary | ICD-10-CM | POA: Diagnosis present

## 2016-12-03 DIAGNOSIS — E861 Hypovolemia: Secondary | ICD-10-CM | POA: Diagnosis present

## 2016-12-03 DIAGNOSIS — N136 Pyonephrosis: Secondary | ICD-10-CM | POA: Diagnosis present

## 2016-12-03 DIAGNOSIS — N179 Acute kidney failure, unspecified: Secondary | ICD-10-CM | POA: Diagnosis present

## 2016-12-03 DIAGNOSIS — E871 Hypo-osmolality and hyponatremia: Secondary | ICD-10-CM | POA: Diagnosis present

## 2016-12-03 DIAGNOSIS — E1122 Type 2 diabetes mellitus with diabetic chronic kidney disease: Secondary | ICD-10-CM | POA: Diagnosis present

## 2016-12-03 DIAGNOSIS — Z96642 Presence of left artificial hip joint: Secondary | ICD-10-CM | POA: Diagnosis present

## 2016-12-03 DIAGNOSIS — K5790 Diverticulosis of intestine, part unspecified, without perforation or abscess without bleeding: Secondary | ICD-10-CM | POA: Diagnosis present

## 2016-12-03 DIAGNOSIS — Z87891 Personal history of nicotine dependence: Secondary | ICD-10-CM

## 2016-12-03 DIAGNOSIS — G9341 Metabolic encephalopathy: Secondary | ICD-10-CM | POA: Diagnosis present

## 2016-12-03 DIAGNOSIS — A4902 Methicillin resistant Staphylococcus aureus infection, unspecified site: Secondary | ICD-10-CM | POA: Diagnosis not present

## 2016-12-03 DIAGNOSIS — N400 Enlarged prostate without lower urinary tract symptoms: Secondary | ICD-10-CM | POA: Diagnosis present

## 2016-12-03 DIAGNOSIS — L8990 Pressure ulcer of unspecified site, unspecified stage: Secondary | ICD-10-CM | POA: Insufficient documentation

## 2016-12-03 DIAGNOSIS — Z794 Long term (current) use of insulin: Secondary | ICD-10-CM

## 2016-12-03 DIAGNOSIS — D696 Thrombocytopenia, unspecified: Secondary | ICD-10-CM | POA: Diagnosis present

## 2016-12-03 DIAGNOSIS — Z6832 Body mass index (BMI) 32.0-32.9, adult: Secondary | ICD-10-CM

## 2016-12-03 DIAGNOSIS — G8929 Other chronic pain: Secondary | ICD-10-CM | POA: Diagnosis present

## 2016-12-03 DIAGNOSIS — I2782 Chronic pulmonary embolism: Secondary | ICD-10-CM | POA: Diagnosis present

## 2016-12-03 DIAGNOSIS — Z66 Do not resuscitate: Secondary | ICD-10-CM

## 2016-12-03 DIAGNOSIS — T68XXXA Hypothermia, initial encounter: Secondary | ICD-10-CM

## 2016-12-03 DIAGNOSIS — E876 Hypokalemia: Secondary | ICD-10-CM | POA: Diagnosis present

## 2016-12-03 DIAGNOSIS — Z8673 Personal history of transient ischemic attack (TIA), and cerebral infarction without residual deficits: Secondary | ICD-10-CM

## 2016-12-03 DIAGNOSIS — Z951 Presence of aortocoronary bypass graft: Secondary | ICD-10-CM

## 2016-12-03 DIAGNOSIS — E11621 Type 2 diabetes mellitus with foot ulcer: Secondary | ICD-10-CM | POA: Diagnosis present

## 2016-12-03 DIAGNOSIS — N390 Urinary tract infection, site not specified: Secondary | ICD-10-CM | POA: Diagnosis present

## 2016-12-03 DIAGNOSIS — E1140 Type 2 diabetes mellitus with diabetic neuropathy, unspecified: Secondary | ICD-10-CM | POA: Diagnosis present

## 2016-12-03 DIAGNOSIS — E1169 Type 2 diabetes mellitus with other specified complication: Secondary | ICD-10-CM

## 2016-12-03 DIAGNOSIS — R0902 Hypoxemia: Secondary | ICD-10-CM

## 2016-12-03 DIAGNOSIS — J9611 Chronic respiratory failure with hypoxia: Secondary | ICD-10-CM | POA: Diagnosis present

## 2016-12-03 DIAGNOSIS — Z7982 Long term (current) use of aspirin: Secondary | ICD-10-CM

## 2016-12-03 DIAGNOSIS — Z89612 Acquired absence of left leg above knee: Secondary | ICD-10-CM

## 2016-12-03 DIAGNOSIS — N182 Chronic kidney disease, stage 2 (mild): Secondary | ICD-10-CM | POA: Diagnosis present

## 2016-12-03 DIAGNOSIS — F329 Major depressive disorder, single episode, unspecified: Secondary | ICD-10-CM | POA: Diagnosis present

## 2016-12-03 DIAGNOSIS — G4730 Sleep apnea, unspecified: Secondary | ICD-10-CM | POA: Diagnosis present

## 2016-12-03 DIAGNOSIS — K219 Gastro-esophageal reflux disease without esophagitis: Secondary | ICD-10-CM | POA: Diagnosis present

## 2016-12-03 DIAGNOSIS — R4182 Altered mental status, unspecified: Secondary | ICD-10-CM | POA: Diagnosis not present

## 2016-12-03 DIAGNOSIS — I252 Old myocardial infarction: Secondary | ICD-10-CM

## 2016-12-03 DIAGNOSIS — I5042 Chronic combined systolic (congestive) and diastolic (congestive) heart failure: Secondary | ICD-10-CM | POA: Diagnosis present

## 2016-12-03 DIAGNOSIS — Z9981 Dependence on supplemental oxygen: Secondary | ICD-10-CM

## 2016-12-03 DIAGNOSIS — Z515 Encounter for palliative care: Secondary | ICD-10-CM

## 2016-12-03 DIAGNOSIS — T502X5A Adverse effect of carbonic-anhydrase inhibitors, benzothiadiazides and other diuretics, initial encounter: Secondary | ICD-10-CM | POA: Diagnosis present

## 2016-12-03 DIAGNOSIS — R627 Adult failure to thrive: Secondary | ICD-10-CM

## 2016-12-03 DIAGNOSIS — Z452 Encounter for adjustment and management of vascular access device: Secondary | ICD-10-CM

## 2016-12-03 DIAGNOSIS — E1151 Type 2 diabetes mellitus with diabetic peripheral angiopathy without gangrene: Secondary | ICD-10-CM | POA: Diagnosis present

## 2016-12-03 DIAGNOSIS — I130 Hypertensive heart and chronic kidney disease with heart failure and stage 1 through stage 4 chronic kidney disease, or unspecified chronic kidney disease: Secondary | ICD-10-CM | POA: Diagnosis present

## 2016-12-03 DIAGNOSIS — Z792 Long term (current) use of antibiotics: Secondary | ICD-10-CM

## 2016-12-03 HISTORY — DX: Urinary tract infection, site not specified: N39.0

## 2016-12-03 LAB — CBC
HCT: 38.2 % — ABNORMAL LOW (ref 39.0–52.0)
Hemoglobin: 12.1 g/dL — ABNORMAL LOW (ref 13.0–17.0)
MCH: 24 pg — ABNORMAL LOW (ref 26.0–34.0)
MCHC: 31.7 g/dL (ref 30.0–36.0)
MCV: 75.6 fL — ABNORMAL LOW (ref 78.0–100.0)
Platelets: 138 10*3/uL — ABNORMAL LOW (ref 150–400)
RBC: 5.05 MIL/uL (ref 4.22–5.81)
RDW: 16.6 % — ABNORMAL HIGH (ref 11.5–15.5)
WBC: 15 10*3/uL — ABNORMAL HIGH (ref 4.0–10.5)

## 2016-12-03 LAB — COMPREHENSIVE METABOLIC PANEL
ALT: 24 U/L (ref 17–63)
AST: 25 U/L (ref 15–41)
Albumin: 1.6 g/dL — ABNORMAL LOW (ref 3.5–5.0)
Alkaline Phosphatase: 97 U/L (ref 38–126)
Anion gap: 9 (ref 5–15)
BUN: 30 mg/dL — ABNORMAL HIGH (ref 6–20)
CO2: 22 mmol/L (ref 22–32)
Calcium: 6.4 mg/dL — CL (ref 8.9–10.3)
Chloride: 101 mmol/L (ref 101–111)
Creatinine, Ser: 1.38 mg/dL — ABNORMAL HIGH (ref 0.61–1.24)
GFR calc Af Amer: 55 mL/min — ABNORMAL LOW (ref >60)
GFR calc non Af Amer: 48 mL/min — ABNORMAL LOW (ref >60)
Glucose, Bld: 125 mg/dL — ABNORMAL HIGH (ref 65–99)
Potassium: 3.2 mmol/L — ABNORMAL LOW (ref 3.5–5.1)
Sodium: 132 mmol/L — ABNORMAL LOW (ref 135–145)
Total Bilirubin: 0.5 mg/dL (ref 0.3–1.2)
Total Protein: 4.8 g/dL — ABNORMAL LOW (ref 6.5–8.1)

## 2016-12-03 LAB — URINALYSIS, ROUTINE W REFLEX MICROSCOPIC
BILIRUBIN URINE: NEGATIVE
Glucose, UA: NEGATIVE mg/dL
Ketones, ur: NEGATIVE mg/dL
Nitrite: NEGATIVE
PH: 7 (ref 5.0–8.0)
Protein, ur: 100 mg/dL — AB
SPECIFIC GRAVITY, URINE: 1.008 (ref 1.005–1.030)

## 2016-12-03 LAB — I-STAT ARTERIAL BLOOD GAS, ED
Acid-Base Excess: 5 mmol/L — ABNORMAL HIGH (ref 0.0–2.0)
Bicarbonate: 29.6 mmol/L — ABNORMAL HIGH (ref 20.0–28.0)
O2 Saturation: 96 %
PCO2 ART: 43.3 mmHg (ref 32.0–48.0)
PH ART: 7.443 (ref 7.350–7.450)
Patient temperature: 98.6
TCO2: 31 mmol/L (ref 22–32)
pO2, Arterial: 77 mmHg — ABNORMAL LOW (ref 83.0–108.0)

## 2016-12-03 LAB — CBG MONITORING, ED: Glucose-Capillary: 168 mg/dL — ABNORMAL HIGH (ref 65–99)

## 2016-12-03 LAB — I-STAT CG4 LACTIC ACID, ED: Lactic Acid, Venous: 1.29 mmol/L (ref 0.5–1.9)

## 2016-12-03 LAB — I-STAT TROPONIN, ED: Troponin i, poc: 0.04 ng/mL (ref 0.00–0.08)

## 2016-12-03 MED ORDER — SODIUM CHLORIDE 0.9 % IV BOLUS (SEPSIS)
1000.0000 mL | Freq: Once | INTRAVENOUS | Status: AC
  Administered 2016-12-04: 1000 mL via INTRAVENOUS

## 2016-12-03 MED ORDER — VANCOMYCIN HCL 10 G IV SOLR
2000.0000 mg | Freq: Once | INTRAVENOUS | Status: AC
  Administered 2016-12-03: 2000 mg via INTRAVENOUS
  Filled 2016-12-03: qty 2000

## 2016-12-03 MED ORDER — PIPERACILLIN-TAZOBACTAM 3.375 G IVPB: 3.3750 g | Freq: Three times a day (TID) | INTRAVENOUS | Status: DC

## 2016-12-03 MED ORDER — SODIUM CHLORIDE 0.9 % IV BOLUS (SEPSIS)
500.0000 mL | Freq: Once | INTRAVENOUS | Status: AC
  Administered 2016-12-03: 500 mL via INTRAVENOUS

## 2016-12-03 MED ORDER — PIPERACILLIN-TAZOBACTAM 3.375 G IVPB 30 MIN
3.3750 g | Freq: Once | INTRAVENOUS | Status: AC
  Administered 2016-12-03: 3.375 g via INTRAVENOUS
  Filled 2016-12-03: qty 50

## 2016-12-03 MED ORDER — IOPAMIDOL (ISOVUE-300) INJECTION 61%
INTRAVENOUS | Status: AC
  Administered 2016-12-04: 100 mL
  Filled 2016-12-03: qty 100

## 2016-12-03 MED ORDER — VANCOMYCIN HCL IN DEXTROSE 1-5 GM/200ML-% IV SOLN: 1000.0000 mg | Freq: Once | INTRAVENOUS | Status: DC

## 2016-12-03 MED ORDER — DIPHENHYDRAMINE HCL 50 MG/ML IJ SOLN
25.0000 mg | Freq: Once | INTRAMUSCULAR | Status: DC
  Filled 2016-12-03: qty 1

## 2016-12-03 MED ORDER — DIPHENHYDRAMINE HCL 50 MG/ML IJ SOLN: 25.0000 mg | INTRAMUSCULAR | Status: DC

## 2016-12-03 MED ORDER — CALCIUM GLUCONATE 10 % IV SOLN
1.0000 g | Freq: Once | INTRAVENOUS | Status: AC
  Administered 2016-12-04: 1 g via INTRAVENOUS
  Filled 2016-12-03: qty 10

## 2016-12-03 MED ORDER — VANCOMYCIN HCL 10 G IV SOLR
1250.0000 mg | INTRAVENOUS | Status: DC
Start: 2016-12-04 — End: 2016-12-04

## 2016-12-03 NOTE — ED Provider Notes (Signed)
[facility] DEPT Provider Note   CSN: [id number] Arrival date & time: 12/03/16  2222     History   Chief Complaint Chief Complaint  Patient presents with  . Altered Mental Status    HPI [name] is a 76 y.o. male with a hx of chronic osteomyelitis (right calcaneous), CAD, CHF, BPH, COPD, CVA, DM, HTN, MI, PE (on Xarelto), recurrent UTI (on IM Invanz), anemia, dementia presents to the Emergency Department complaining of gradual, persistent, progressively worsening AMS per the nursing facility, prompting the 911 call.  The report he was normal before the 10pm check.  Record review shows pt was evaluated in the ID clinic this morning with discussion of AKA of the right leg vs hospice care.  It appears the pt was amenable to discussion of the AKA.    LEVEL 5 CAVEAT for AMS.    Hx provided by the chart, wife and EMS.  Pt's wife reports she was contacted tonight about his AMS.  She states he has many medical problems and that his BP can drop quickly.  She reports the last time his BP was very low he wanted pressors, but did not want intubation or CPR.  She believes this would be the case today but reports "it's his decision."   The history is provided by the patient and medical records. No language interpreter was used.    Past Medical History:  Diagnosis Date  . Acute osteomyelitis of calcaneum, right ([place]) 09/30/2016  . Adhesive capsulitis   . Angina   . Anxiety   . CAD (coronary artery disease)   . CHF (congestive heart failure) ([place])   . Chronic back pain   . Chronic kidney disease    hx of BPH  . COPD (chronic obstructive pulmonary disease) ([place])   . CVA (cerebral infarction) Questionable history  . Depression   . Diabetes mellitus   . Diverticulosis   . Facial rash 09/30/2016  . Falls frequently 06/2014  . GERD (gastroesophageal reflux disease)   . Hyperlipidemia   . Hypertension   . MRSA bacteremia    2011 - possible endocarditis, received 6 weeks IV treatment   . Myocardial infarction ([place])   . Neuromuscular disorder ([place])    HX of diabetic periferal neuropathy  . On home oxygen therapy    "1.5L prn" (04/12/2014)  . Osteomyelitis ([place]) 2012   Sternoclavicular joint   . PE (pulmonary embolism) 10-15 years ago   Lifelong Coumadin  . Peripheral vascular disease ([place])   . Shortness of breath   . Sleep apnea   . UTI (urinary tract infection) 12/03/2016    Patient Active Problem List   Diagnosis Date Noted  . Sepsis ([place]) 12/04/2016  . UTI (urinary tract infection) 12/03/2016  . Acute osteomyelitis of calcaneum, right ([place]) 09/30/2016  . Facial rash 09/30/2016  . S/P AKA (above knee amputation) unilateral, left ([place]) 04/29/2016  . Cellulitis 04/29/2016  . 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 Dyslipidemia associated with type 2 diabetes mellitus ([place]) 02/15/2016  . Sepsis affecting skin ([place])   . Cellulitis of right leg 01/24/2016  . Panlobular emphysema ([place])   . Gangrene ([place])   . Septic shock ([place]) 10/31/2015  . Hypokalemia   . Microcytic anemia 10/19/2015  . Atherosclerosis of native arteries of the extremities with ulceration ([place]) 08/03/2015  . Chronic pulmonary embolism ([place]) 06/18/2015  . Diabetic peripheral neuropathy associated with type 2 diabetes mellitus ([place]) 06/18/2015  . Allergic  rhinitis 05/22/2015  . Constipation due to opioid therapy 05/06/2015  . Vitamin D deficiency 05/03/2015  . OA (osteoarthritis) 05/12/2014  . Diabetes type II with atherosclerosis of arteries of extremities ([place]) 05/12/2014  . GERD (gastroesophageal reflux disease) 11/10/2011  . Peripheral vascular disease ([place]) 04/16/2011  . Chronic obstructive pulmonary disease ([place]) 02/27/2011  . BPH (benign prostatic hyperplasia) 11/01/2010  . Long term current use of anticoagulant therapy 05/03/2010  . Bilateral shoulder pain 11/19/2009  . Depression 07/08/2006  . Essential hypertension 07/08/2006  . Anxiety state  05/21/2006  . Coronary atherosclerosis 05/21/2006  . Chronic systolic CHF (congestive heart failure) ([place]) 05/21/2006    Past Surgical History:  Procedure Laterality Date  . AMPUTATION  03/16/2011   Procedure: AMPUTATION DIGIT;  Surgeon: Angelia Mould, MD;  Location: [facility] OR;  Service: Vascular;  Laterality: Left;  Great toe  . AMPUTATION Left 11/08/2015   Procedure: AMPUTATION ABOVE KNEE LEFT;  Surgeon: Waynetta Sandy, MD;  Location: [facility];  Service: Vascular;  Laterality: Left;  . CARDIAC CATHETERIZATION  October 18, 2010   Known obstructive disease, no further blockages  . CARDIAC CATHETERIZATION  03/17/2005   EF 35-40%  . COLONOSCOPY  2006    Multiple polyps removed, repeat in 5 years  . CORONARY ARTERY BYPASS GRAFT  1992  . [place], 2002  . DOPPLER ECHOCARDIOGRAPHY  Sept 2011   EF 50-55% with some impaired diastolic relaxation  . FEMORAL-POPLITEAL BYPASS GRAFT  2008   Left  . FRACTURE SURGERY  1980s   Hip  . HIP ARTHROPLASTY Left   . PERIPHERAL VASCULAR CATHETERIZATION N/A 11/06/2015   Procedure: Abdominal Aortogram;  Surgeon: Serafina Mitchell, MD;  Location: [facility] LAB;  Service: Cardiovascular;  Laterality: N/A;  . PERIPHERAL VASCULAR CATHETERIZATION Bilateral 11/06/2015   Procedure: Lower Extremity Angiography;  Surgeon: Serafina Mitchell, MD;  Location: [facility];  Service: Cardiovascular;  Laterality: Bilateral;  . Right iliopopliteal bypass - [place]  . septic arthritis     Removal of infected CABG wire by Dr Arlyce Dice - 2011  . SPINE SURGERY  2002   Cervical fusion vertebroplasty C3-4-5  . US ECHOCARDIOGRAPHY  03/04/2006   EF 50-55%       Home Medications    Prior to Admission medications   Medication Sig Start Date End Date Taking? Authorizing Provider  acetaminophen (TYLENOL) 325 MG tablet Take 650 mg by mouth every 6 (six) hours as needed for fever.    [provider]  Amino Acids-Protein Hydrolys  (FEEDING SUPPLEMENT, PRO-STAT SUGAR FREE 64,) LIQD Take 30 mLs by mouth 2 (two) times daily. 11/10/15   Rai, Vernelle Emerald, MD  aspirin EC 81 MG EC tablet Take 1 tablet (81 mg total) by mouth daily. 11/10/15   Rai, Vernelle Emerald, MD  atorvastatin (LIPITOR) 20 MG tablet Take 20 mg by mouth at bedtime.     [provider]  bisacodyl (DULCOLAX) 10 MG suppository Place 1 suppository (10 mg total) rectally daily as needed for moderate constipation. 11/10/15   Rai, Ripudeep K, MD  carvedilol (COREG) 3.125 MG tablet Take 3.125 mg by mouth 2 (two) times daily with a meal.     [provider]  cetirizine (ZYRTEC ALLERGY) 10 MG tablet Take 10 mg by mouth daily with breakfast.     [provider]  diclofenac sodium (VOLTAREN) 1 % GEL Apply 4 g topically 3 (three) times daily.    [provider]  docusate sodium (COLACE) 100 MG capsule Take 100 mg by mouth 2 (two) times daily.    [provider]  DULoxetine (CYMBALTA) 30 MG capsule Take 90 mg by mouth daily with breakfast.     [provider]  ferrous sulfate 325 (65 FE) MG tablet Take 1 tablet (325 mg total) by mouth daily with breakfast. 10/29/15   Tat, Shanon Brow, MD  furosemide (LASIX) 80 MG tablet Take 1 tablet (80 mg total) by mouth 2 (two) times daily. 10/29/15   Orson Eva, MD  gabapentin (NEURONTIN) 600 MG tablet Take 600 mg by mouth 3 (three) times daily.     [provider]  guaiFENesin-dextromethorphan (ROBITUSSIN DM) 100-10 MG/5ML syrup Take 15 mLs by mouth every 4 (four) hours as needed for cough. 11/10/15   Rai, Vernelle Emerald, MD  HYDROcodone-acetaminophen (NORCO/VICODIN) 5-325 MG tablet Take 1 tablet by mouth every 4 (four) hours as needed for moderate pain.    [provider]  insulin lispro (HUMALOG) 100 UNIT/ML cartridge Inject 0-13 Units into the skin 3 (three) times daily with meals. Inject as per sliding scale: if  0-70=0 units; 71-149 = 8 units, 150-600= 13 units subcutaneously before meals  related to DM.    [provider]  insulin NPH Human (HUMULIN N,NOVOLIN N) 100 UNIT/ML injection Inject 20 Units into the skin 3 (three) times daily before meals.    [provider]  lubiprostone (AMITIZA) 8 MCG capsule Take 8 mcg by mouth 2 (two) times daily with a meal.    [provider]  methocarbamol (ROBAXIN) 500 MG tablet Take 500 mg by mouth at bedtime.    [provider]  Multiple Vitamins-Minerals (DECUBI-VITE PO) Take 1 capsule by mouth daily with breakfast.     [provider]  mupirocin ointment (BACTROBAN) 2 % Place 1 application into the nose 2 (two) times daily. 09/30/16   Truman Hayward, MD  nitroGLYCERIN (NITROSTAT) 0.4 MG SL tablet Place 1 tablet (0.4 mg total) under the tongue as needed. For chest pains Patient taking differently: Place 0.4 mg under the tongue every 5 (five) minutes as needed for chest pain.  10/19/13   Alveda Reasons, MD  omeprazole (PRILOSEC) 40 MG capsule Take 1 capsule (40 mg total) by mouth daily. 05/10/14   Alveda Reasons, MD  OXYGEN Inhale 2 L into the lungs continuous.     [provider]  polyethylene glycol (MIRALAX / GLYCOLAX) packet Take 17 g by mouth daily with breakfast.     [provider]  potassium chloride SA (K-DUR,KLOR-CON) 20 MEQ tablet Take 20 mEq by mouth daily with breakfast.     [provider]  Propylene Glycol (SYSTANE BALANCE) 0.6 % SOLN Place 1 drop into both eyes 2 (two) times daily.     [provider]  rivaroxaban (XARELTO) 20 MG TABS tablet Take 20 mg by mouth daily with supper.    [provider]  senna-docusate (SENOKOT-S) 8.6-50 MG tablet Take 1 tablet by mouth 2 (two) times daily. 11/10/15   Mendel Corning, MD  Skin Protectants, Misc. (CALAZIME SKIN PROTECTANT EX) Apply 1 application topically daily. Apply to sacrum and right buttock every day    [provider]  tamsulosin (FLOMAX) 0.4 MG CAPS capsule Take 1 capsule (0.4  mg total) by mouth daily after supper. 10/19/13   Alveda Reasons, MD  traMADol (ULTRAM) 50 MG tablet Take 1 tablet (50 mg total) by mouth 2 (two) times daily. 08/01/16  Reed, Tiffany L, DO  traZODone (DESYREL) 100 MG tablet Take 100 mg by mouth at bedtime.     [provider]  Vitamin D, Ergocalciferol, (DRISDOL) 50000 units CAPS capsule Take 1 capsule (50,000 Units total) by mouth every 7 (seven) days. 05/03/15   Gerlene Fee, NP  Vitamins A & D (VITAMIN A & D) ointment Apply 1 application topically 2 (two) times daily as needed for dry skin.    [provider]    Family History Family History  Problem Relation Age of Onset  . Heart disease Father   . Heart disease Mother     Social History Social History  Substance Use Topics  . Smoking status: Former Smoker    Packs/day: 1.00    Quit date: 06/19/2000  . Smokeless tobacco: Never Used  . Alcohol use No     Allergies   Diazepam and Vancomycin   Review of Systems Review of Systems  Unable to perform ROS: Dementia     Physical Exam Updated Vital Signs BP (!) 109/97   Pulse 80   Temp 97.9 F (36.6 C)   Resp (!) 24   Ht 5\' 11"  (1.803 m)   Wt 104.3 kg (230 lb)   SpO2 100%   BMI 32.08 kg/m   Physical Exam  Constitutional: He appears well-developed and well-nourished. He appears lethargic. No distress.  Awake, alert, nontoxic appearance  HENT:  Head: Normocephalic and atraumatic.  Mouth/Throat: Oropharynx is clear and moist. Mucous membranes are dry. No oropharyngeal exudate.  No rash  Eyes: Pupils are equal, round, and reactive to light. Conjunctivae are normal. No scleral icterus.  Neck: Normal range of motion. Neck supple. No neck rigidity.  Cardiovascular: Normal rate, regular rhythm and intact distal pulses.   Pulses:      Radial pulses are 1+ on the right side, and 1+ on the left side.       Dorsalis pedis pulses are 1+ on the right side. Left dorsalis pedis pulse not accessible.    Pulmonary/Chest: Effort normal. Tachypnea noted. No respiratory distress. He has decreased breath sounds (throughout). He has no wheezes.  Equal chest expansion  Abdominal: Soft. Bowel sounds are normal. He exhibits mass (lower abd, nonpulsitile). There is tenderness in the suprapubic area. There is no rigidity, no rebound and no guarding. Hernia confirmed negative in the right inguinal area and confirmed negative in the left inguinal area.  Obese abd  Genitourinary: Right testis shows no tenderness. Left testis shows no tenderness. Uncircumcised.  Genitourinary Comments: Yeast balanitis noted  Musculoskeletal: Normal range of motion. He exhibits no edema.  Left AKA without erythema of the stump RLE with chronic venous skin changes and scars from previous surgical intervention  Lymphadenopathy: No inguinal adenopathy noted on the right or left side.  Neurological: He appears lethargic. GCS eye subscore is 3. GCS verbal subscore is 4. GCS motor subscore is 6.  Pt is lethargic aroused by voice Shakes his head yes and no appropriately Pt follows 1 step commands   Skin: Skin is warm and dry. He is not diaphoretic.  Psychiatric: He has a normal mood and affect.  Nursing note and vitals reviewed.    ED Treatments / Results  Labs (all labs ordered are listed, but only abnormal results are displayed) Labs Reviewed  COMPREHENSIVE METABOLIC PANEL - Abnormal; Notable for the following:       Result Value   Sodium 132 (*)    Potassium 3.2 (*)    Glucose,  Bld 125 (*)    BUN 30 (*)    Creatinine, Ser 1.38 (*)    Calcium 6.4 (*)    Total Protein 4.8 (*)    Albumin 1.6 (*)    GFR calc non Af Amer 48 (*)    GFR calc Af Amer 55 (*)    All other components within normal limits  CBC - Abnormal; Notable for the following:    WBC 15.0 (*)    Hemoglobin 12.1 (*)    HCT 38.2 (*)    MCV 75.6 (*)    MCH 24.0 (*)    RDW 16.6 (*)    Platelets 138 (*)    All other components within normal limits   URINALYSIS, ROUTINE W REFLEX MICROSCOPIC - Abnormal; Notable for the following:    APPearance CLOUDY (*)    Hgb urine dipstick MODERATE (*)    Protein, ur 100 (*)    Leukocytes, UA LARGE (*)    Bacteria, UA RARE (*)    Squamous Epithelial / LPF 0-5 (*)    All other components within normal limits  CBG MONITORING, ED - Abnormal; Notable for the following:    Glucose-Capillary 168 (*)    All other components within normal limits  I-STAT ARTERIAL BLOOD GAS, ED - Abnormal; Notable for the following:    pO2, Arterial 77.0 (*)    Bicarbonate 29.6 (*)    Acid-Base Excess 5.0 (*)    All other components within normal limits  CULTURE, BLOOD (ROUTINE X 2)  CULTURE, BLOOD (ROUTINE X 2)  PROTIME-INR  CBC  BASIC METABOLIC PANEL  MAGNESIUM  PHOSPHORUS  CORTISOL  HEPARIN LEVEL (UNFRACTIONATED)  APTT  I-STAT CG4 LACTIC ACID, ED  I-STAT TROPONIN, ED  I-STAT CG4 LACTIC ACID, ED    EKG  EKG Interpretation  Date/Time:  Wednesday December 03 2016 22:33:10 EDT Ventricular Rate:  89 PR Interval:    QRS Duration: 174 QT Interval:  430 QTC Calculation: 524 R Axis:   8 Text Interpretation:  Sinus rhythm Left bundle branch block No significant change since last tracing Confirmed by Virgel Manifold [phone]) on 12/03/2016 11:05:02 PM       Radiology Ct Abdomen Pelvis W Contrast  Result Date: 12/04/2016 CLINICAL DATA:  Abdominal pain and distention today. EXAM: CT ABDOMEN AND PELVIS WITH CONTRAST TECHNIQUE: Multidetector CT imaging of the abdomen and pelvis was performed using the standard protocol following bolus administration of intravenous contrast. CONTRAST:  100 ml ISOVUE-300 IOPAMIDOL (ISOVUE-300) INJECTION 61% COMPARISON:  CT abdomen and pelvis 04/29/2016, 11/13/2016 and 03/15/2011. FINDINGS: Lower chest: There is cardiomegaly. Dependent basilar atelectasis is noted. No pleural or pericardial effusion. Hepatobiliary: No focal liver abnormality is seen. No gallstones, gallbladder wall  thickening, or biliary dilatation. Pancreas: Unremarkable. No pancreatic ductal dilatation or surrounding inflammatory changes. Spleen: No focal abnormality. Mild splenomegaly at 15.8 cm, unchanged. Adrenals/Urinary Tract: Adrenal glands appear normal. Small right renal cyst is identified. There is mild to moderate bilateral hydronephrosis, worse on the right. Cause for hydronephrosis is not identified. Distal ureters are obscured by streak artifact in the pelvis. Foley catheter is in place in the urinary bladder. Hydronephrosis is improved compared to the most recent examination but new since 05/01/2016. Small bilateral renal cysts are seen. Large fat containing lesion off the lower pole the right kidney is consistent with an angiomyolipoma is unchanged since the most recent exam. Stomach/Bowel: Stomach and small bowel appear normal. Moderate colonic stool burden noted. The appendix is not visualized but no pericecal inflammatory  change is identified. Vascular/Lymphatic: Extensive aortoiliac atherosclerosis without aneurysm. No lymphadenopathy. Reproductive: Prostate gland is largely obscured. Other: No fluid collection or lymphadenopathy. Musculoskeletal: No acute bony abnormality. Left hip arthroplasty in place. Acetabular protrusion on the left is noted. IMPRESSION: No acute abnormality abdomen or pelvis. Mild to moderate bilateral hydronephrosis is greater on the right and appears improved since the most recent examination. Large fat containing lesion off the inferior pole the right kidney compatible with angiomyolipoma is unchanged since the most recent CT. Electronically Signed   By: Inge Rise M.D.   On: 12/04/2016 01:45   Dg Chest Port 1 View  Result Date: 12/03/2016 CLINICAL DATA:  76 y/o M; 1 hour altered mental status, code sepsis. EXAM: PORTABLE CHEST 1 VIEW COMPARISON:  04/29/2016 chest radiograph FINDINGS: Stable enlarged cardiac silhouette given projection and technique. Median sternotomy  wires are aligned. Anterior cervical fusion hardware partially visualized. Low lung volumes accentuate pulmonary markings. Streaky opacities at left lung base probably represent atelectasis. No focal consolidation, effusion, or pneumothorax identified. No acute osseous abnormality is evident. IMPRESSION: Stable mild cardiomegaly. Low lung volumes. Left basilar atelectasis. No acute pulmonary process identified. Electronically Signed   By: Kristine Garbe M.D.   On: 12/03/2016 23:26    Procedures Procedures (including critical care time)  CRITICAL CARE Performed by: Abigail Butts Total critical care time: 60 minutes Critical care time was exclusive of separately billable procedures and treating other patients. Critical care was necessary to treat or prevent imminent or life-threatening deterioration. Critical care was time spent personally by me on the following activities: development of treatment plan with patient and/or surrogate as well as nursing, discussions with consultants, evaluation of patient's response to treatment, examination of patient, obtaining history from patient or surrogate, ordering and performing treatments and interventions, ordering and review of laboratory studies, ordering and review of radiographic studies, pulse oximetry and re-evaluation of patient's condition.   Medications Ordered in ED Medications  diphenhydrAMINE (BENADRYL) injection 25 mg (0 mg Intravenous Hold 12/03/16 2333)  vancomycin (VANCOCIN) 1,250 mg in sodium chloride 0.9 % 250 mL IVPB (not administered)    And  diphenhydrAMINE (BENADRYL) injection 25 mg (not administered)  piperacillin-tazobactam (ZOSYN) IVPB 3.375 g (not administered)  0.9 %  sodium chloride infusion (not administered)  0.9 %  sodium chloride infusion (not administered)  phenylephrine (NEO-SYNEPHRINE) 10 mg in sodium chloride 0.9 % 250 mL (0.04 mg/mL) infusion (not administered)  hydrocortisone sodium succinate  (SOLU-CORTEF) 100 MG injection 50 mg (not administered)  aspirin chewable tablet 81 mg (not administered)  atorvastatin (LIPITOR) tablet 20 mg (not administered)  piperacillin-tazobactam (ZOSYN) IVPB 3.375 g (0 g Intravenous Stopped 12/04/16 0341)  sodium chloride 0.9 % bolus 1,000 mL (0 mLs Intravenous Stopped 12/04/16 0340)    And  sodium chloride 0.9 % bolus 1,000 mL (0 mLs Intravenous Stopped 12/04/16 0340)    And  sodium chloride 0.9 % bolus 1,000 mL (0 mLs Intravenous Stopped 12/04/16 0341)    And  sodium chloride 0.9 % bolus 500 mL (0 mLs Intravenous Stopped 12/04/16 0341)  vancomycin (VANCOCIN) 2,000 mg in sodium chloride 0.9 % 500 mL IVPB (2,000 mg Intravenous New Bag/Given 12/03/16 2336)  calcium gluconate 1 g in sodium chloride 0.9 % 100 mL IVPB (0 g Intravenous Stopped 12/04/16 0341)  iopamidol (ISOVUE-300) 61 % injection (100 mLs  Contrast Given 12/04/16 0004)     Initial Impression / Assessment and Plan / ED Course  I have reviewed the triage vital signs and the nursing  notes.  Pertinent labs & imaging results that were available during my care of the patient were reviewed by me and considered in my medical decision making (see chart for details).  Clinical Course as of Dec 04 340  Thu Dec 04, 2016  4818 Discussed with Dr. Jimmy Footman who will evaluate for possible admission.    [HM]    Clinical Course User Index [HM] Katara Griner, Gwenlyn Perking    Results of altered mental status, hypoxia and hypotension. Sepsis protocol initiated.  Patient given 30 mL/kg bolus and IV antibiotics. Likely source is urinary tract infection. Patient does have DO NOT RESUSCITATE however suspect based on previous conversations that he would want IV pressors if needed. Discussed with wife who agrees with this.  Patient hypothermic, hypotensive, hypoxic. Mild hypokalemia, hypocalcemia. Leukocytosis. Mild anemia but at baseline. Urinalysis appears to be with urinary tract infection on cath urine  specimen.  CT scan without acute abnormality including no evidence of abdominal aortic aneurysm. Hydronephrosis is noted but improved from previous. Chest x-ray without evidence of pneumonia.  Patient is critically ill and will be admitted to the ICU as his blood pressure remains low despite adequate fluid resuscitation.  Bair Hugger initiated for hypothermia not resolved with warm blankets.  The patient was discussed with and seen by Dr. Wilson Singer who agrees with the treatment plan.   Final Clinical Impressions(s) / ED Diagnoses   Final diagnoses:  Hypotension, unspecified hypotension type  Hypoxia  Hypothermia, initial encounter  Leukocytosis, unspecified type  Sepsis, due to unspecified organism [facility])  Urinary tract infection without hematuria, site unspecified    New Prescriptions New Prescriptions   No medications on file     Agapito Games 12/04/16 [place]    Virgel Manifold, MD 12/13/16 1041

## 2016-12-03 NOTE — ED Triage Notes (Signed)
Pt brought to ED by [facility] from [facility] [place], pt more drowsy since 2200 today, mostly responding to loud voice and to pain, SPO2 low 80's on RA 95 % on 3L [place], BP on the low 80/50's, CGB 167.

## 2016-12-03 NOTE — Progress Notes (Signed)
Subjective:    Reason for consult: hurting all over    Patient ID: [name], male    DOB: [birth year], 76 y.o.   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 This was performed by Servando Snare, MD from [facility] (though the patient told me it was Dr. Bridgett Larsson) in August of 2017. This Spring the patient continued to suffer from a nonhealing ulcer on his right foot and had MRI  Which showed:  Focal osteomyelitis in the calcaneus underlying the plantar -lateral ulceration, with some associated focal cortical disruption and local marrow edema and enhancement.   He was placed on levaquin though there is mention of MRSA. I can see orders for a bone biopsy but I see no documentation of such a procedure, nor do I see any culture results.  He was brought by EMS to our clinic in July for evaluation of his osteomyelitis. The patient and EMS thought he was brought for facial rash due to MRSA.  [place] did not accompany him during that visit. I had recommended AKA to cure his infection but he had refused at that time. He was seen by Dr. Donzetta Matters from [facility] who agreed with AKA vs hospice care.  Since then it APPEARS that pt has reconsidered and now agreed to AKA as there is order at [facility] for him to be re-evaluated by Dr. Donzetta Matters.  He currently remains on doxycycline chronically and is on IM invanz for a UTI. He does not recall dysuria though his memory is impaired.   Past Medical History:  Diagnosis Date  . Acute osteomyelitis of calcaneum, right ([place]) 09/30/2016  . Adhesive capsulitis   . Angina   . Anxiety   . CAD (coronary artery disease)   . CHF (congestive heart failure) ([place])   . Chronic back pain   . Chronic kidney disease    hx of BPH  . COPD (chronic obstructive pulmonary disease) ([place])   . CVA (cerebral infarction)  Questionable history  . Depression   . Diabetes mellitus   . Diverticulosis   . Facial rash 09/30/2016  . Falls frequently 06/2014  . GERD (gastroesophageal reflux disease)   . Hyperlipidemia   . Hypertension   . 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 Myocardial infarction ([place])   . Neuromuscular disorder ([place])    HX of diabetic periferal neuropathy  . On home oxygen therapy    "1.5L prn" (04/12/2014)  . Osteomyelitis ([place]) 2012   Sternoclavicular joint   . PE (pulmonary embolism) 10-15 years ago   Lifelong Coumadin  . Peripheral vascular disease ([place])   . Shortness of breath   . Sleep apnea     Past Surgical History:  Procedure Laterality Date  . AMPUTATION  03/16/2011   Procedure: AMPUTATION DIGIT;  Surgeon: Angelia Mould, MD;  [facility];  Service: Vascular;  Laterality: Left;  Great toe  . AMPUTATION Left 11/08/2015   Procedure: AMPUTATION ABOVE KNEE LEFT;  Surgeon: Waynetta Sandy, MD;  Location: [place];  Service: Vascular;  Laterality: Left;  . CARDIAC CATHETERIZATION  October 18, 2010   Known obstructive disease, no further blockages  . CARDIAC CATHETERIZATION  03/17/2005   EF 35-40%  . COLONOSCOPY  2006  Multiple polyps removed, repeat in 5 years  . CORONARY ARTERY BYPASS GRAFT  1992  . [place], 2002  . DOPPLER ECHOCARDIOGRAPHY  Sept 2011   EF 50-55% with some impaired diastolic relaxation  . FEMORAL-POPLITEAL BYPASS GRAFT  2008   Left  . FRACTURE SURGERY  1980s   Hip  . HIP ARTHROPLASTY Left   . PERIPHERAL VASCULAR CATHETERIZATION N/A 11/06/2015   Procedure: Abdominal Aortogram;  Surgeon: Serafina Mitchell, MD;  Location: [place] CV LAB;  Service: Cardiovascular;  Laterality: N/A;  . PERIPHERAL VASCULAR CATHETERIZATION Bilateral 11/06/2015   Procedure: Lower Extremity Angiography;  Surgeon: Serafina Mitchell, MD;  Location: [place] [facility] LAB;  Service: Cardiovascular;   Laterality: Bilateral;  . Right iliopopliteal bypass - [place]  . septic arthritis     Removal of infected CABG wire by Dr Arlyce Dice - 2011  . SPINE SURGERY  2002   Cervical fusion vertebroplasty C3-4-5  . US ECHOCARDIOGRAPHY  03/04/2006   EF 50-55%    Family History  Problem Relation Age of Onset  . Heart disease Father   . Heart disease Mother       Social History   Social History  . Marital status: Married    Spouse name: Butch Penny  . Number of children: 3  . Years of education: 12   Occupational History  . Retired-machinest/welder Retired   Social History Main Topics  . Smoking status: Former Smoker    Packs/day: 1.00    Quit date: 06/19/2000  . Smokeless tobacco: Never Used  . Alcohol use No  . Drug use: No  . Sexual activity: No   Other Topics Concern  . Not on file   Social History Narrative   Health Care POA: TBD   Emergency Contact: wife, Butch Penny [phone]   End of Life Plan: pt does not have AD and not interested.   Who lives with you: Butch Penny and step-daughter-Betty   Any pets: none   Diet: Pt has a varied diet of protein, starch and vegetables.   Exercise: Pt does not have regular exercise routine.   Seatbelts: Pt reports wearing seatbelt when in vehicles.    Hobbies: listening to car races.              Allergies  Allergen Reactions  . Diazepam Anxiety    REACTION: makes patient cry  . Vancomycin Other (See Comments)    "red man syndrome"     Current Outpatient Prescriptions:  .  acetaminophen (TYLENOL) 325 MG tablet, Take 650 mg by mouth every 6 (six) hours as needed for fever., Disp: , Rfl:  .  Amino Acids-Protein Hydrolys (FEEDING SUPPLEMENT, PRO-STAT SUGAR FREE 64,) LIQD, Take 30 mLs by mouth 2 (two) times daily., Disp: 900 mL, Rfl: 0 .  aspirin EC 81 MG EC tablet, Take 1 tablet (81 mg total) by mouth daily., Disp: , Rfl:  .  atorvastatin (LIPITOR) 20 MG tablet, Take 20 mg by mouth at bedtime. , Disp: , Rfl:  .  bisacodyl (DULCOLAX) 10 MG  suppository, Place 1 suppository (10 mg total) rectally daily as needed for moderate constipation., Disp: 12 suppository, Rfl: 0 .  carvedilol (COREG) 3.125 MG tablet, Take 3.125 mg by mouth 2 (two) times daily with a meal. , Disp: , Rfl:  .  cetirizine (ZYRTEC ALLERGY) 10 MG tablet, Take 10 mg by mouth daily with breakfast. , Disp: , Rfl:  .  diclofenac sodium (VOLTAREN) 1 % GEL, Apply 4  g topically 3 (three) times daily., Disp: , Rfl:  .  docusate sodium (COLACE) 100 MG capsule, Take 100 mg by mouth 2 (two) times daily., Disp: , Rfl:  .  DULoxetine (CYMBALTA) 30 MG capsule, Take 90 mg by mouth daily with breakfast. , Disp: , Rfl:  .  ferrous sulfate 325 (65 FE) MG tablet, Take 1 tablet (325 mg total) by mouth daily with breakfast., Disp: 30 tablet, Rfl: 0 .  furosemide (LASIX) 80 MG tablet, Take 1 tablet (80 mg total) by mouth 2 (two) times daily., Disp: 60 tablet, Rfl: 0 .  gabapentin (NEURONTIN) 600 MG tablet, Take 600 mg by mouth 3 (three) times daily. , Disp: , Rfl:  .  guaiFENesin-dextromethorphan (ROBITUSSIN DM) 100-10 MG/5ML syrup, Take 15 mLs by mouth every 4 (four) hours as needed for cough., Disp: 118 mL, Rfl: 0 .  HYDROcodone-acetaminophen (NORCO/VICODIN) 5-325 MG tablet, Take 1 tablet by mouth every 4 (four) hours as needed for moderate pain., Disp: , Rfl:  .  insulin lispro (HUMALOG) 100 UNIT/ML cartridge, Inject 0-13 Units into the skin 3 (three) times daily with meals. Inject as per sliding scale: if  0-70=0 units; 71-149 = 8 units, 150-600= 13 units subcutaneously before meals related to DM., Disp: , Rfl:  .  insulin NPH Human (HUMULIN N,NOVOLIN N) 100 UNIT/ML injection, Inject 20 Units into the skin 3 (three) times daily before meals., Disp: , Rfl:  .  lubiprostone (AMITIZA) 8 MCG capsule, Take 8 mcg by mouth 2 (two) times daily with a meal., Disp: , Rfl:  .  methocarbamol (ROBAXIN) 500 MG tablet, Take 500 mg by mouth at bedtime., Disp: , Rfl:  .  Multiple Vitamins-Minerals  (DECUBI-VITE PO), Take 1 capsule by mouth daily with breakfast. , Disp: , Rfl:  .  mupirocin ointment (BACTROBAN) 2 %, Place 1 application into the nose 2 (two) times daily., Disp: 30 g, Rfl: 2 .  nitroGLYCERIN (NITROSTAT) 0.4 MG SL tablet, Place 1 tablet (0.4 mg total) under the tongue as needed. For chest pains (Patient taking differently: Place 0.4 mg under the tongue every 5 (five) minutes as needed for chest pain. ), Disp: 30 tablet, Rfl: 6 .  omeprazole (PRILOSEC) 40 MG capsule, Take 1 capsule (40 mg total) by mouth daily., Disp: 30 capsule, Rfl: 3 .  OXYGEN, Inhale 2 L into the lungs continuous. , Disp: , Rfl:  .  polyethylene glycol (MIRALAX / GLYCOLAX) packet, Take 17 g by mouth daily with breakfast. , Disp: , Rfl:  .  potassium chloride SA (K-DUR,KLOR-CON) 20 MEQ tablet, Take 20 mEq by mouth daily with breakfast. , Disp: , Rfl:  .  Propylene Glycol (SYSTANE BALANCE) 0.6 % SOLN, Place 1 drop into both eyes 2 (two) times daily. , Disp: , Rfl:  .  rivaroxaban (XARELTO) 20 MG TABS tablet, Take 20 mg by mouth daily with supper., Disp: , Rfl:  .  senna-docusate (SENOKOT-S) 8.6-50 MG tablet, Take 1 tablet by mouth 2 (two) times daily., Disp: , Rfl:  .  Skin Protectants, Misc. (CALAZIME SKIN PROTECTANT EX), Apply 1 application topically daily. Apply to sacrum and right buttock every day, Disp: , Rfl:  .  tamsulosin (FLOMAX) 0.4 MG CAPS capsule, Take 1 capsule (0.4 mg total) by mouth daily after supper., Disp: 90 capsule, Rfl: 1 .  traMADol (ULTRAM) 50 MG tablet, Take 1 tablet (50 mg total) by mouth 2 (two) times daily., Disp: 60 tablet, Rfl: 5 .  traZODone (DESYREL) 100 MG tablet, Take 100  mg by mouth at bedtime. , Disp: , Rfl:  .  Vitamin D, Ergocalciferol, (DRISDOL) 50000 units CAPS capsule, Take 1 capsule (50,000 Units total) by mouth every 7 (seven) days., Disp: 30 capsule, Rfl: prn .  Vitamins A & D (VITAMIN A & D) ointment, Apply 1 application topically 2 (two) times daily as needed for dry  skin., Disp: , Rfl:     Review of Systems  Unable to perform ROS: Dementia   He was able to answer some questions but I could not take a reliable comprehsensive ROS    Objective:   Physical Exam  Constitutional: He appears well-nourished. No distress.  HENT:  Head: Normocephalic and atraumatic.  Mouth/Throat: Oropharynx is clear and moist. No oropharyngeal exudate.  Eyes: Conjunctivae and EOM are normal. No scleral icterus.  Neck: Normal range of motion. Neck supple.  Cardiovascular: Normal rate and regular rhythm.   Pulmonary/Chest: Effort normal. No respiratory distress. He has no wheezes.  Abdominal: Soft. He exhibits no distension.  Musculoskeletal: He exhibits edema and deformity. He exhibits no tenderness.  Neurological: He is alert. He exhibits normal muscle tone.  Oriented to person  Skin: Skin is warm and dry. No rash noted. He is not diaphoretic. No erythema. No pallor.  Psychiatric: He has a normal mood and affect. His behavior is normal. Judgment and thought content normal.   Facial rash  09/30/16:    Today resolved  Left AKA site 09/30/16:    Right heel where he has osteo underlying this 09/30/16:     Heel is stable      Assessment & Plan:    Right calcaneal osteomyelitis:  As before  type of infection is NOT  curable with antibiotics  I STRONGLY ENDORSE AKA to cure this  Continue oral doxy in the interim  #2 Facial rash resolved  #3 UTI: wonder if he is truly having symptoms

## 2016-12-03 NOTE — Progress Notes (Addendum)
Pharmacy Antibiotic Note  [name] is a 76 y.o. male admitted on 12/03/2016 with sepsis.  Pharmacy has been consulted for Vancocin and Zosyn dosing.  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  Plan: Vancomycin 2000mg  x1 thedn 1250mg  IV every 24 hours.  Goal trough 15-20 mcg/mL.  Will run at half the normal rate and pre-treat w/ Bendaryl as discussed w/ PA. Zosyn 3.375g IV q8h (4 hour infusion).  Height: 5\' 11"  (180.3 cm) Weight: 230 lb (104.3 kg) IBW/kg (Calculated) : 75.3  Temp (24hrs), Avg:95.8 F (35.4 C), Min:95.8 F (35.4 C), Max:95.8 F (35.4 C)   Recent Labs Lab 12/03/16 2253 12/03/16 2307  WBC 15.0*  --   CREATININE 1.38*  --   LATICACIDVEN  --  1.29    Estimated Creatinine Clearance: 55.1 mL/min (A) (by C-G formula based on SCr of 1.38 mg/dL (H)).    Allergies  Allergen Reactions  . Diazepam Anxiety    REACTION: makes patient cry  . Vancomycin Other (See Comments)    "red man syndrome"     Thank you for allowing pharmacy to be a part of this patient's care.  Wynona Neat, PharmD, BCPS  12/03/2016 11:40 PM    ADDENDUM: Consulted to change Zosyn to Merrem as pt was on Invanz IM as outpatient for UTI.  Will start 1g IV every 8 hours. VB  12/04/2016 3:59 AM

## 2016-12-04 ENCOUNTER — Encounter (HOSPITAL_COMMUNITY): Payer: Self-pay

## 2016-12-04 ENCOUNTER — Inpatient Hospital Stay (HOSPITAL_COMMUNITY): Payer: Medicare Other

## 2016-12-04 ENCOUNTER — Emergency Department (HOSPITAL_COMMUNITY): Payer: Medicare Other

## 2016-12-04 DIAGNOSIS — M86171 Other acute osteomyelitis, right ankle and foot: Secondary | ICD-10-CM | POA: Diagnosis present

## 2016-12-04 DIAGNOSIS — I5042 Chronic combined systolic (congestive) and diastolic (congestive) heart failure: Secondary | ICD-10-CM | POA: Diagnosis present

## 2016-12-04 DIAGNOSIS — I2510 Atherosclerotic heart disease of native coronary artery without angina pectoris: Secondary | ICD-10-CM | POA: Diagnosis present

## 2016-12-04 DIAGNOSIS — T502X5A Adverse effect of carbonic-anhydrase inhibitors, benzothiadiazides and other diuretics, initial encounter: Secondary | ICD-10-CM | POA: Diagnosis present

## 2016-12-04 DIAGNOSIS — E1122 Type 2 diabetes mellitus with diabetic chronic kidney disease: Secondary | ICD-10-CM | POA: Diagnosis present

## 2016-12-04 DIAGNOSIS — A419 Sepsis, unspecified organism: Secondary | ICD-10-CM | POA: Diagnosis present

## 2016-12-04 DIAGNOSIS — N136 Pyonephrosis: Secondary | ICD-10-CM | POA: Diagnosis present

## 2016-12-04 DIAGNOSIS — N170 Acute kidney failure with tubular necrosis: Secondary | ICD-10-CM | POA: Diagnosis not present

## 2016-12-04 DIAGNOSIS — N10 Acute tubulo-interstitial nephritis: Secondary | ICD-10-CM | POA: Diagnosis not present

## 2016-12-04 DIAGNOSIS — N179 Acute kidney failure, unspecified: Secondary | ICD-10-CM | POA: Diagnosis present

## 2016-12-04 DIAGNOSIS — J9611 Chronic respiratory failure with hypoxia: Secondary | ICD-10-CM | POA: Diagnosis present

## 2016-12-04 DIAGNOSIS — Z515 Encounter for palliative care: Secondary | ICD-10-CM | POA: Diagnosis not present

## 2016-12-04 DIAGNOSIS — I340 Nonrheumatic mitral (valve) insufficiency: Secondary | ICD-10-CM

## 2016-12-04 DIAGNOSIS — E1151 Type 2 diabetes mellitus with diabetic peripheral angiopathy without gangrene: Secondary | ICD-10-CM | POA: Diagnosis present

## 2016-12-04 DIAGNOSIS — N184 Chronic kidney disease, stage 4 (severe): Secondary | ICD-10-CM | POA: Diagnosis not present

## 2016-12-04 DIAGNOSIS — R4182 Altered mental status, unspecified: Secondary | ICD-10-CM | POA: Diagnosis present

## 2016-12-04 DIAGNOSIS — L97429 Non-pressure chronic ulcer of left heel and midfoot with unspecified severity: Secondary | ICD-10-CM | POA: Diagnosis present

## 2016-12-04 DIAGNOSIS — N390 Urinary tract infection, site not specified: Secondary | ICD-10-CM | POA: Diagnosis not present

## 2016-12-04 DIAGNOSIS — R627 Adult failure to thrive: Secondary | ICD-10-CM | POA: Diagnosis not present

## 2016-12-04 DIAGNOSIS — G9341 Metabolic encephalopathy: Secondary | ICD-10-CM | POA: Diagnosis present

## 2016-12-04 DIAGNOSIS — I130 Hypertensive heart and chronic kidney disease with heart failure and stage 1 through stage 4 chronic kidney disease, or unspecified chronic kidney disease: Secondary | ICD-10-CM | POA: Diagnosis present

## 2016-12-04 DIAGNOSIS — N189 Chronic kidney disease, unspecified: Secondary | ICD-10-CM | POA: Diagnosis present

## 2016-12-04 DIAGNOSIS — I2782 Chronic pulmonary embolism: Secondary | ICD-10-CM | POA: Diagnosis present

## 2016-12-04 DIAGNOSIS — Z66 Do not resuscitate: Secondary | ICD-10-CM | POA: Diagnosis not present

## 2016-12-04 DIAGNOSIS — E871 Hypo-osmolality and hyponatremia: Secondary | ICD-10-CM | POA: Diagnosis present

## 2016-12-04 DIAGNOSIS — M86671 Other chronic osteomyelitis, right ankle and foot: Secondary | ICD-10-CM | POA: Diagnosis not present

## 2016-12-04 DIAGNOSIS — I739 Peripheral vascular disease, unspecified: Secondary | ICD-10-CM | POA: Diagnosis not present

## 2016-12-04 DIAGNOSIS — T68XXXA Hypothermia, initial encounter: Secondary | ICD-10-CM

## 2016-12-04 DIAGNOSIS — R52 Pain, unspecified: Secondary | ICD-10-CM | POA: Diagnosis not present

## 2016-12-04 DIAGNOSIS — R6521 Severe sepsis with septic shock: Secondary | ICD-10-CM

## 2016-12-04 DIAGNOSIS — E1169 Type 2 diabetes mellitus with other specified complication: Secondary | ICD-10-CM | POA: Diagnosis present

## 2016-12-04 DIAGNOSIS — Z7401 Bed confinement status: Secondary | ICD-10-CM | POA: Diagnosis not present

## 2016-12-04 DIAGNOSIS — M869 Osteomyelitis, unspecified: Secondary | ICD-10-CM | POA: Diagnosis not present

## 2016-12-04 DIAGNOSIS — E1140 Type 2 diabetes mellitus with diabetic neuropathy, unspecified: Secondary | ICD-10-CM | POA: Diagnosis present

## 2016-12-04 DIAGNOSIS — E785 Hyperlipidemia, unspecified: Secondary | ICD-10-CM | POA: Diagnosis present

## 2016-12-04 DIAGNOSIS — Z452 Encounter for adjustment and management of vascular access device: Secondary | ICD-10-CM | POA: Diagnosis not present

## 2016-12-04 DIAGNOSIS — R338 Other retention of urine: Secondary | ICD-10-CM | POA: Diagnosis not present

## 2016-12-04 DIAGNOSIS — Z89612 Acquired absence of left leg above knee: Secondary | ICD-10-CM | POA: Diagnosis not present

## 2016-12-04 LAB — GLUCOSE, CAPILLARY
GLUCOSE-CAPILLARY: 148 mg/dL — AB (ref 65–99)
GLUCOSE-CAPILLARY: 138 mg/dL — AB (ref 65–99)
GLUCOSE-CAPILLARY: 188 mg/dL — AB (ref 65–99)
Glucose-Capillary: 151 mg/dL — ABNORMAL HIGH (ref 65–99)
Glucose-Capillary: 187 mg/dL — ABNORMAL HIGH (ref 65–99)

## 2016-12-04 LAB — BASIC METABOLIC PANEL
Anion gap: 9 (ref 5–15)
BUN: 32 mg/dL — ABNORMAL HIGH (ref 6–20)
CHLORIDE: 100 mmol/L — AB (ref 101–111)
CO2: 24 mmol/L (ref 22–32)
CREATININE: 1.52 mg/dL — AB (ref 0.61–1.24)
Calcium: 7.7 mg/dL — ABNORMAL LOW (ref 8.9–10.3)
GFR calc non Af Amer: 42 mL/min — ABNORMAL LOW (ref >60)
GFR, EST AFRICAN AMERICAN: 49 mL/min — AB (ref >60)
GLUCOSE: 151 mg/dL — AB (ref 65–99)
Potassium: 3.6 mmol/L (ref 3.5–5.1)
Sodium: 133 mmol/L — ABNORMAL LOW (ref 135–145)

## 2016-12-04 LAB — CBC
HEMATOCRIT: 28.9 % — AB (ref 39.0–52.0)
HEMOGLOBIN: 8.8 g/dL — AB (ref 13.0–17.0)
MCH: 23.3 pg — ABNORMAL LOW (ref 26.0–34.0)
MCHC: 30.4 g/dL (ref 30.0–36.0)
MCV: 76.7 fL — ABNORMAL LOW (ref 78.0–100.0)
Platelets: 226 10*3/uL (ref 150–400)
RBC: 3.77 MIL/uL — ABNORMAL LOW (ref 4.22–5.81)
RDW: 17 % — AB (ref 11.5–15.5)
WBC: 20.9 10*3/uL — ABNORMAL HIGH (ref 4.0–10.5)

## 2016-12-04 LAB — CORTISOL: CORTISOL PLASMA: 26.8 ug/dL

## 2016-12-04 LAB — PHOSPHORUS: PHOSPHORUS: 4.9 mg/dL — AB (ref 2.5–4.6)

## 2016-12-04 LAB — MAGNESIUM: Magnesium: 1.8 mg/dL (ref 1.7–2.4)

## 2016-12-04 LAB — HEPARIN LEVEL (UNFRACTIONATED): Heparin Unfractionated: >2.2 IU/mL — ABNORMAL HIGH (ref 0.30–0.70)

## 2016-12-04 LAB — PROTIME-INR
INR: 2.15
Prothrombin Time: 23.9 seconds — ABNORMAL HIGH (ref 11.4–15.2)

## 2016-12-04 LAB — ECHOCARDIOGRAM COMPLETE
Height: 68 in
Weight: 3615.54 oz

## 2016-12-04 LAB — APTT: aPTT: 41 seconds — ABNORMAL HIGH (ref 24–36)

## 2016-12-04 LAB — MRSA PCR SCREENING: MRSA by PCR: NEGATIVE

## 2016-12-04 MED ORDER — HEPARIN (PORCINE) IN NACL 100-0.45 UNIT/ML-% IJ SOLN
1400.0000 [IU]/h | INTRAMUSCULAR | Status: DC
  Administered 2016-12-04: 1400 [IU]/h via INTRAVENOUS
  Filled 2016-12-04 (×2): qty 250

## 2016-12-04 MED ORDER — ASPIRIN 81 MG PO CHEW
81.0000 mg | CHEWABLE_TABLET | Freq: Every day | ORAL | Status: DC
  Administered 2016-12-05 – 2016-12-09 (×5): 81 mg via ORAL
  Filled 2016-12-04 (×5): qty 1

## 2016-12-04 MED ORDER — SODIUM CHLORIDE 0.9 % IV SOLN
INTRAVENOUS | Status: DC
  Administered 2016-12-04 – 2016-12-06 (×3): via INTRAVENOUS

## 2016-12-04 MED ORDER — LINEZOLID 600 MG/300ML IV SOLN
600.0000 mg | Freq: Two times a day (BID) | INTRAVENOUS | Status: DC
  Administered 2016-12-04 – 2016-12-10 (×13): 600 mg via INTRAVENOUS
  Filled 2016-12-04 (×14): qty 300

## 2016-12-04 MED ORDER — SODIUM CHLORIDE 0.9 % IV SOLN: 250.0000 mL | INTRAVENOUS | Status: DC | PRN

## 2016-12-04 MED ORDER — SODIUM CHLORIDE 0.9 % IV SOLN
1.0000 g | Freq: Three times a day (TID) | INTRAVENOUS | Status: DC
Start: 2016-12-04 — End: 2016-12-10
  Administered 2016-12-04 – 2016-12-10 (×19): 1 g via INTRAVENOUS
  Filled 2016-12-04 (×21): qty 1

## 2016-12-04 MED ORDER — ATORVASTATIN CALCIUM 20 MG PO TABS
20.0000 mg | ORAL_TABLET | Freq: Every day | ORAL | Status: DC
  Administered 2016-12-05 – 2016-12-08 (×4): 20 mg via ORAL
  Filled 2016-12-04 (×6): qty 1

## 2016-12-04 MED ORDER — POTASSIUM CHLORIDE 10 MEQ/100ML IV SOLN
10.0000 meq | INTRAVENOUS | Status: AC
  Administered 2016-12-04 (×3): 10 meq via INTRAVENOUS
  Filled 2016-12-04 (×4): qty 100

## 2016-12-04 MED ORDER — HYDROCORTISONE NA SUCCINATE PF 100 MG IJ SOLR
50.0000 mg | Freq: Four times a day (QID) | INTRAMUSCULAR | Status: DC
  Administered 2016-12-04 – 2016-12-05 (×5): 50 mg via INTRAVENOUS
  Filled 2016-12-04: qty 2
  Filled 2016-12-04 (×5): qty 1

## 2016-12-04 MED ORDER — NOREPINEPHRINE 4 MG/250ML-% IV SOLN
0.0000 ug/min | INTRAVENOUS | Status: DC
  Administered 2016-12-04: 2 ug/min via INTRAVENOUS
  Filled 2016-12-04 (×2): qty 250

## 2016-12-04 MED ORDER — PERFLUTREN LIPID MICROSPHERE
1.0000 mL | INTRAVENOUS | Status: AC | PRN
  Administered 2016-12-04: 5 mL via INTRAVENOUS
  Filled 2016-12-04: qty 10

## 2016-12-04 MED ORDER — SODIUM CHLORIDE 0.9 % IV SOLN: 30.0000 meq | Freq: Once | INTRAVENOUS | Status: DC

## 2016-12-04 MED ORDER — NOREPINEPHRINE BITARTRATE 1 MG/ML IV SOLN
0.0000 ug/min | INTRAVENOUS | Status: DC
  Filled 2016-12-04: qty 4

## 2016-12-04 MED ORDER — SODIUM CHLORIDE 0.9 % IV SOLN
0.0000 ug/min | INTRAVENOUS | Status: DC
  Administered 2016-12-04 (×4): 200 ug/min via INTRAVENOUS
  Administered 2016-12-04: 150 ug/min via INTRAVENOUS
  Administered 2016-12-04: 100 ug/min via INTRAVENOUS
  Administered 2016-12-04: 180 ug/min via INTRAVENOUS
  Administered 2016-12-04: 200 ug/min via INTRAVENOUS
  Administered 2016-12-04: 60 ug/min via INTRAVENOUS
  Administered 2016-12-04 (×2): 200 ug/min via INTRAVENOUS
  Filled 2016-12-04 (×12): qty 1

## 2016-12-04 MED ORDER — PANTOPRAZOLE SODIUM 40 MG IV SOLR
40.0000 mg | INTRAVENOUS | Status: DC
  Administered 2016-12-04 – 2016-12-06 (×3): 40 mg via INTRAVENOUS
  Filled 2016-12-04 (×3): qty 40

## 2016-12-04 MED ORDER — INSULIN ASPART 100 UNIT/ML ~~LOC~~ SOLN
0.0000 [IU] | SUBCUTANEOUS | Status: DC
  Administered 2016-12-04 (×3): 3 [IU] via SUBCUTANEOUS
  Administered 2016-12-04 (×2): 2 [IU] via SUBCUTANEOUS
  Administered 2016-12-05 (×2): 3 [IU] via SUBCUTANEOUS
  Administered 2016-12-05: 2 [IU] via SUBCUTANEOUS

## 2016-12-04 NOTE — Care Management Note (Addendum)
Case Management Note  Patient Details  Name: [name] MRN: [id number] Date of Birth: Oct 06, 1940  Subjective/Objective:       Pt admitted with AMS from SNF             Action/Plan:  PTA has a wife but is bedbound from SNF - CSW consulted.  Surgery evaluated for AKA.    Expected Discharge Date:                  Expected Discharge Plan:  Skilled Nursing Facility  In-House Referral:  Clinical Social Work  Discharge planning Services  CM Consult  Post Acute Care Choice:    Choice offered to:     DME Arranged:    DME Agency:     HH Arranged:    HH Agency:     Status of Service:     If discussed at [employer], dates discussed:    Additional Comments:  [name], RN 12/04/2016, 2:44 PM

## 2016-12-04 NOTE — Procedures (Signed)
Central Venous Catheter Insertion Procedure Note [name] [id number] 24-Feb-1941  Procedure: Insertion of Central Venous Catheter Indications: Assessment of intravascular volume, Drug and/or fluid administration and Frequent blood sampling  Procedure Details Consent: Risks of procedure as well as the alternatives and risks of each were explained to the (patient/caregiver).  Consent for procedure obtained. Time Out: Verified patient identification, verified procedure, site/side was marked, verified correct patient position, special equipment/implants available, medications/allergies/relevent history reviewed, required imaging and test results available.  Performed Real time [place] was used to ID and cannulate vessel  Maximum sterile technique was used including antiseptics, cap, gloves, gown, hand hygiene, mask and sheet. Skin prep: Chlorhexidine; local anesthetic administered A antimicrobial bonded/coated triple lumen catheter was placed in the right internal jugular vein using the Seldinger technique.  Evaluation Blood flow good Complications: No apparent complications Patient did tolerate procedure well. Chest X-ray ordered to verify placement.  CXR: pending.  Clementeen Graham 12/04/2016, 12:58 PM  Erick Colace ACNP-BC [place] Pager # [phone] OR # [phone] if no answer

## 2016-12-04 NOTE — Progress Notes (Signed)
  Echocardiogram 2D Echocardiogram with definity has been performed.  Darlina Sicilian M 12/04/2016, 8:53 AM

## 2016-12-04 NOTE — ED Notes (Signed)
Report attempted, RN to call back. 

## 2016-12-04 NOTE — Progress Notes (Signed)
RT unable to place A-Line. RT able to hit the artery but unable to thread catheter. Another RT assisted and tried as well and was unsuccessful. RN aware.

## 2016-12-04 NOTE — H&P (Signed)
PULMONARY / CRITICAL CARE MEDICINE   Name: [name] MRN: [id number] DOB: June 20, 1940    ADMISSION DATE:  12/03/2016 CONSULTATION DATE:  12/04/16  REFERRING MD:  Christy Gentles  CHIEF COMPLAINT:  AMS  HISTORY OF PRESENT ILLNESS:  Pt is encephelopathic; therefore, this HPI is obtained from chart review. [name] is a 76 y.o. male with PMH as outlined below and who is bedbound and resides in a SNF. 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  It has been recommended that he have AKA to which she was initially reluctant. Other options presented at the time were hospice. Per ID notes from 12/03/16, patient now agreeable to right AKA and he is pending reevaluation by Dr. Donzetta Matters of vascular surgery.  He was brought to [facility] ED 12/03/16 with AMS and hypotension. BP in ED ranging anywhere from systolic of 82X to 93Z, lactate normal. Urine suggestive of UTI. Per RN, when Foley was placed, urine was very thick and had purulent material within it.  He received 3.5 L IV fluids; however, remained hypotensive. Of note he has a limited CODE BLUE and per EDP's conversations with pt's wife, vasopressors are OK.  PAST MEDICAL HISTORY :  He  has a past medical history of Acute osteomyelitis of calcaneum, right ([place]) (09/30/2016); Adhesive capsulitis; Angina; Anxiety; CAD (coronary artery disease); CHF (congestive heart failure) ([place]); Chronic back pain; Chronic kidney disease; COPD (chronic obstructive pulmonary disease) (HCC); CVA (cerebral infarction) (Questionable history); Depression; Diabetes mellitus; Diverticulosis; Facial rash (09/30/2016); Falls frequently (06/2014); GERD (gastroesophageal reflux disease); Hyperlipidemia; Hypertension; MRSA bacteremia; Myocardial infarction [facility]); Neuromuscular disorder ([place]); On home oxygen therapy; Osteomyelitis  ([place]) (2012); PE (pulmonary embolism) (10-15 years ago); Peripheral vascular disease ([place]); Shortness of breath; Sleep apnea; and UTI (urinary tract infection) (12/03/2016).  PAST SURGICAL HISTORY: He  has a past surgical history that includes septic arthritis; Coronary artery bypass graft (1992); Fracture surgery (1980s); Spine surgery (2002); Coronary stent placement (1999, 2002); doppler echocardiography (Sept 2011); Colonoscopy (2006 ); Right iliopopliteal bypass - 1983 (1983); Femoral-popliteal Bypass Graft (2008); Cardiac catheterization (October 18, 2010); Cardiac catheterization (03/17/2005); US ECHOCARDIOGRAPHY (03/04/2006); Amputation (03/16/2011); Hip Arthroplasty (Left); Cardiac catheterization (N/A, 11/06/2015); Cardiac catheterization (Bilateral, 11/06/2015); and Amputation (Left, 11/08/2015).  Allergies  Allergen Reactions  . Diazepam Anxiety    REACTION: makes patient cry  . Vancomycin Other (See Comments)    "red man syndrome"    No current facility-administered medications on file prior to encounter.    Current Outpatient Prescriptions on File Prior to Encounter  Medication Sig  . acetaminophen (TYLENOL) 325 MG tablet Take 650 mg by mouth every 6 (six) hours as needed for fever.  . Amino Acids-Protein Hydrolys (FEEDING SUPPLEMENT, PRO-STAT SUGAR FREE 64,) LIQD Take 30 mLs by mouth 2 (two) times daily.  Marland Kitchen aspirin EC 81 MG EC tablet Take 1 tablet (81 mg total) by mouth daily.  Marland Kitchen atorvastatin (LIPITOR) 20 MG tablet Take 20 mg by mouth at bedtime.   . bisacodyl (DULCOLAX) 10 MG suppository Place 1 suppository (10 mg total) rectally daily as needed for moderate constipation.  . carvedilol (COREG) 3.125 MG tablet Take 3.125 mg by mouth 2 (two) times daily with a meal.   . cetirizine (ZYRTEC ALLERGY) 10 MG tablet Take 10 mg by mouth daily with breakfast.   . diclofenac sodium (VOLTAREN) 1 % GEL Apply  4 g topically 3 (three) times daily.  Marland Kitchen docusate sodium (COLACE) 100 MG capsule Take 100  mg by mouth 2 (two) times daily.  . DULoxetine (CYMBALTA) 30 MG capsule Take 90 mg by mouth daily with breakfast.   . ferrous sulfate 325 (65 FE) MG tablet Take 1 tablet (325 mg total) by mouth daily with breakfast.  . furosemide (LASIX) 80 MG tablet Take 1 tablet (80 mg total) by mouth 2 (two) times daily.  Marland Kitchen gabapentin (NEURONTIN) 600 MG tablet Take 600 mg by mouth 3 (three) times daily.   Marland Kitchen guaiFENesin-dextromethorphan (ROBITUSSIN DM) 100-10 MG/5ML syrup Take 15 mLs by mouth every 4 (four) hours as needed for cough.  Marland Kitchen HYDROcodone-acetaminophen (NORCO/VICODIN) 5-325 MG tablet Take 1 tablet by mouth every 4 (four) hours as needed for moderate pain.  Marland Kitchen insulin lispro (HUMALOG) 100 UNIT/ML cartridge Inject 0-13 Units into the skin 3 (three) times daily with meals. Inject as per sliding scale: if  0-70=0 units; 71-149 = 8 units, 150-600= 13 units subcutaneously before meals related to DM.  Marland Kitchen insulin NPH Human (HUMULIN N,NOVOLIN N) 100 UNIT/ML injection Inject 20 Units into the skin 3 (three) times daily before meals.  . lubiprostone (AMITIZA) 8 MCG capsule Take 8 mcg by mouth 2 (two) times daily with a meal.  . methocarbamol (ROBAXIN) 500 MG tablet Take 500 mg by mouth at bedtime.  . Multiple Vitamins-Minerals (DECUBI-VITE PO) Take 1 capsule by mouth daily with breakfast.   . mupirocin ointment (BACTROBAN) 2 % Place 1 application into the nose 2 (two) times daily.  . nitroGLYCERIN (NITROSTAT) 0.4 MG SL tablet Place 1 tablet (0.4 mg total) under the tongue as needed. For chest pains (Patient taking differently: Place 0.4 mg under the tongue every 5 (five) minutes as needed for chest pain. )  . omeprazole (PRILOSEC) 40 MG capsule Take 1 capsule (40 mg total) by mouth daily.  . OXYGEN Inhale 2 L into the lungs continuous.   . polyethylene glycol (MIRALAX / GLYCOLAX) packet Take 17 g by mouth daily with breakfast.   . potassium chloride SA (K-DUR,KLOR-CON) 20 MEQ tablet Take 20 mEq by mouth daily with  breakfast.   . Propylene Glycol (SYSTANE BALANCE) 0.6 % SOLN Place 1 drop into both eyes 2 (two) times daily.   . rivaroxaban (XARELTO) 20 MG TABS tablet Take 20 mg by mouth daily with supper.  . senna-docusate (SENOKOT-S) 8.6-50 MG tablet Take 1 tablet by mouth 2 (two) times daily.  . Skin Protectants, Misc. (CALAZIME SKIN PROTECTANT EX) Apply 1 application topically daily. Apply to sacrum and right buttock every day  . tamsulosin (FLOMAX) 0.4 MG CAPS capsule Take 1 capsule (0.4 mg total) by mouth daily after supper.  . traMADol (ULTRAM) 50 MG tablet Take 1 tablet (50 mg total) by mouth 2 (two) times daily.  . traZODone (DESYREL) 100 MG tablet Take 100 mg by mouth at bedtime.   . Vitamin D, Ergocalciferol, (DRISDOL) 50000 units CAPS capsule Take 1 capsule (50,000 Units total) by mouth every 7 (seven) days.  . Vitamins A & D (VITAMIN A & D) ointment Apply 1 application topically 2 (two) times daily as needed for dry skin.    FAMILY HISTORY:  His indicated that his mother is deceased. He indicated that his father is deceased.    SOCIAL HISTORY: He  reports that he quit smoking about 16 years ago. He smoked 1.00 pack per day. He has never used smokeless tobacco. He reports that he does not  drink alcohol or use drugs.  REVIEW OF SYSTEMS:   Unable to obtain as patient is encephalopathic.  SUBJECTIVE:  Awake but unable to answer questions or follow commands. Does not head.  VITAL SIGNS: BP (!) 88/48   Pulse 76   Temp (!) 97.5 F (36.4 C)   Resp 18   Ht 5\' 11"  (1.803 m)   Wt 104.3 kg (230 lb)   SpO2 100%   BMI 32.08 kg/m   HEMODYNAMICS:    VENTILATOR SETTINGS:    INTAKE / OUTPUT: No intake/output data recorded.   PHYSICAL EXAMINATION: General: Likely ill-appearing male, in NAD. Neuro: Awake but does not answer questions. Notes head. HEENT: [place]/AT. PERRL, sclerae anicteric. Cardiovascular: RRR, no M/R/G.  Lungs: Respirations even and unlabored.  CTA bilaterally, No  W/R/R. Abdomen: BS x 4, soft, NT/ND.  Musculoskeletal: Left AKA, right calcaneal wound does not appear to have any purulence. Skin: Intact, warm, no rashes.  LABS:  BMET  Recent Labs Lab 12/03/16 2253  NA 132*  K 3.2*  CL 101  CO2 22  BUN 30*  CREATININE 1.38*  GLUCOSE 125*    Electrolytes  Recent Labs Lab 12/03/16 2253  CALCIUM 6.4*    CBC  Recent Labs Lab 12/03/16 2253  WBC 15.0*  HGB 12.1*  HCT 38.2*  PLT 138*    Coag's No results for input(s): APTT, INR in the last 168 hours.  Sepsis Markers  Recent Labs Lab 12/03/16 2307  LATICACIDVEN 1.29    ABG  Recent Labs Lab 12/03/16 2340  PHART 7.443  PCO2ART 43.3  PO2ART 77.0*    Liver Enzymes  Recent Labs Lab 12/03/16 2253  AST 25  ALT 24  ALKPHOS 97  BILITOT 0.5  ALBUMIN 1.6*    Cardiac Enzymes No results for input(s): TROPONINI, PROBNP in the last 168 hours.  Glucose  Recent Labs Lab 12/03/16 2307  GLUCAP 168*    Imaging Ct Abdomen Pelvis W Contrast  Result Date: 12/04/2016 CLINICAL DATA:  Abdominal pain and distention today. EXAM: CT ABDOMEN AND PELVIS WITH CONTRAST TECHNIQUE: Multidetector CT imaging of the abdomen and pelvis was performed using the standard protocol following bolus administration of intravenous contrast. CONTRAST:  100 ml ISOVUE-300 IOPAMIDOL (ISOVUE-300) INJECTION 61% COMPARISON:  CT abdomen and pelvis 04/29/2016, 11/13/2016 and 03/15/2011. FINDINGS: Lower chest: There is cardiomegaly. Dependent basilar atelectasis is noted. No pleural or pericardial effusion. Hepatobiliary: No focal liver abnormality is seen. No gallstones, gallbladder wall thickening, or biliary dilatation. Pancreas: Unremarkable. No pancreatic ductal dilatation or surrounding inflammatory changes. Spleen: No focal abnormality. Mild splenomegaly at 15.8 cm, unchanged. Adrenals/Urinary Tract: Adrenal glands appear normal. Small right renal cyst is identified. There is mild to moderate bilateral  hydronephrosis, worse on the right. Cause for hydronephrosis is not identified. Distal ureters are obscured by streak artifact in the pelvis. Foley catheter is in place in the urinary bladder. Hydronephrosis is improved compared to the most recent examination but new since 05/01/2016. Small bilateral renal cysts are seen. Large fat containing lesion off the lower pole the right kidney is consistent with an angiomyolipoma is unchanged since the most recent exam. Stomach/Bowel: Stomach and small bowel appear normal. Moderate colonic stool burden noted. The appendix is not visualized but no pericecal inflammatory change is identified. Vascular/Lymphatic: Extensive aortoiliac atherosclerosis without aneurysm. No lymphadenopathy. Reproductive: Prostate gland is largely obscured. Other: No fluid collection or lymphadenopathy. Musculoskeletal: No acute bony abnormality. Left hip arthroplasty in place. Acetabular protrusion on the left is noted. IMPRESSION: No acute  abnormality abdomen or pelvis. Mild to moderate bilateral hydronephrosis is greater on the right and appears improved since the most recent examination. Large fat containing lesion off the inferior pole the right kidney compatible with angiomyolipoma is unchanged since the most recent CT. Electronically Signed   By: Inge Rise M.D.   On: 12/04/2016 01:45   Dg Chest Port 1 View  Result Date: 12/03/2016 CLINICAL DATA:  76 y/o M; 1 hour altered mental status, code sepsis. EXAM: PORTABLE CHEST 1 VIEW COMPARISON:  04/29/2016 chest radiograph FINDINGS: Stable enlarged cardiac silhouette given projection and technique. Median sternotomy wires are aligned. Anterior cervical fusion hardware partially visualized. Low lung volumes accentuate pulmonary markings. Streaky opacities at left lung base probably represent atelectasis. No focal consolidation, effusion, or pneumothorax identified. No acute osseous abnormality is evident. IMPRESSION: Stable mild  cardiomegaly. Low lung volumes. Left basilar atelectasis. No acute pulmonary process identified. Electronically Signed   By: Kristine Garbe M.D.   On: 12/03/2016 23:26     STUDIES:  CT A/P 9/12 > no acute process. Mild to moderate bilateral hydronephrosis greater on the right, improved since prior exam. Angiomyolipoma of the right kidney. Echo 9/13 >  CULTURES: Blood 9/12 >  Urine 9/12 >   ANTIBIOTICS: Vanc 9/12 >  Merem 9/12 >   SIGNIFICANT EVENTS: 9/13 > admit.  LINES/TUBES: None.  DISCUSSION: 76 y.o. male from nursing home admitted 9/13 with AMS and hypotension, presumably due to urosepsis. Given 3.5 L IV fluids but did not have improvement in BP; therefore, PCCM asked to admit to ICU. Of note, he is limited CODE BLUE; vasopressors OK.  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 Chronic hypoxic respiratory failure - on 1.5 L oxygen. P:   Heparin gtt in lieu of preadmission xarelto. Hold preadmission xarelto. Continue supplemental O2 as needed to maintain SpO2 > 92%.  CARDIOVASCULAR A:  Shock - presumably due to urosepsis. A repeat sepsis assessment have been performed. Hx PVD, MI, HTN, HLD, CAD, combined CHF (Echo from July 2017 with EF 30 - 35%, G2DD). LIMITED CODE STATUS - no CPR / ACLS / intubation; vasopressors OK. P:  Neo-Synephrine through PIV as needed to maintain goal MAP > 65. Assess cortisol. Stress dose steroids if cortisol < 20. Assess echo. Continue preadmission ASA, atorvastatin. Hold preadmission carvedilol, furosemide. Day team to please consult vascular surgery (pt is supposed to see Dr. Donzetta Matters as outpatient for consideration right AKA).  RENAL A:   AoCKD - ? Chronic hydro per CT; improved on CT 9/12 compared to prior imaging. Hyponatremia - presumably hypovolemic. Hypokalemia. Hypocalcemia - s/p 1g Ca gluconate in ED. P:   NS @ 100. 98mEq K x 1. BMP in AM. May need urology consult.  GASTROINTESTINAL A:    Nutrition. Hx GERD, diverticulosis. P:   NPO.  HEMATOLOGIC A:   On chronic anticoagulation (xarelto) for chronic PE. VTE Prophylaxis. Mild thrombocytopenia. P:  SCD's / heparin gtt. CBC in AM.  INFECTIOUS A:   Shock - presumably due to urosepsis. A repeat sepsis assessment have been performed. Chronic right calcaneal osteo - seen by ID, recommending AKA. Hx recurrent UTI - on IM invanz as outpatient. P:   Abx as above (vanc / merem).  Follow cultures as above. Day team to please consult vascular surgery (pt issupposed to see Dr. Donzetta Matters as outpatient for consideration right AKA).  ENDOCRINE A:   Hx DM. P:   SSI.  NEUROLOGIC A:   Acute encephalopathy - presumably  due to sepsis.  Unclear of baseline mental status. Hx anxiety, depression, falls. P:   Hold preadmission duloxetine, gabapentin, norco, methocarbamol, trazodone.  Family updated: None available.  EDP discussed code status with pt's wife.  DNR (no CPR / ACLS / Intubation); though, vasopressors OK.  Interdisciplinary Family Meeting v Palliative Care Meeting:  Due by: 12/11/16.  CC time: 35 min.   Montey Hora, [facility] Pager: [phone]  or [phone] 12/04/2016, 3:20 AM

## 2016-12-04 NOTE — Progress Notes (Signed)
ANTICOAGULATION CONSULT NOTE - Initial Consult  Pharmacy Consult:  Heparin Indication:  History of PE  Allergies  Allergen Reactions  . Diazepam Anxiety    REACTION: makes patient cry  . Vancomycin Other (See Comments)    "red man syndrome"    Patient Measurements: Height: 5\' 11"  (180.3 cm) Weight: 230 lb (104.3 kg) IBW/kg (Calculated) : 75.3 Heparin Dosing Weight: 97 kg  Vital Signs: Temp: 97.9 F (36.6 C) (09/13 0330) Temp Source: Rectal (09/12 2303) BP: 109/97 (09/13 0330) Pulse Rate: 80 (09/13 0330)  Labs:  Recent Labs  12/03/16 2253  HGB 12.1*  HCT 38.2*  PLT 138*  CREATININE 1.38*    Estimated Creatinine Clearance: 55.1 mL/min (A) (by C-G formula based on SCr of 1.38 mg/dL (H)).   Medical History: Past Medical History:  Diagnosis Date  . Acute osteomyelitis of calcaneum, right ([place]) 09/30/2016  . Adhesive capsulitis   . Angina   . Anxiety   . CAD (coronary artery disease)   . CHF (congestive heart failure) ([place])   . Chronic back pain   . Chronic kidney disease    hx of BPH  . COPD (chronic obstructive pulmonary disease) ([place])   . CVA (cerebral infarction) Questionable history  . Depression   . Diabetes mellitus   . Diverticulosis   . Facial rash 09/30/2016  . Falls frequently 06/2014  . GERD (gastroesophageal reflux disease)   . Hyperlipidemia   . Hypertension   . 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 Myocardial infarction ([place])   . Neuromuscular disorder ([place])    HX of diabetic periferal neuropathy  . On home oxygen therapy    "1.5L prn" (04/12/2014)  . Osteomyelitis ([place]) 2012   Sternoclavicular joint   . PE (pulmonary embolism) 10-15 years ago   Lifelong Coumadin  . Peripheral vascular disease ([place])   . Shortness of breath   . Sleep apnea   . UTI (urinary tract infection) 12/03/2016      Assessment: 12 YOM admitted with sepsis and started on broad spectrum antibiotics.  Pharmacy consulted  to transition patient from Xarelto to IV heparin for history of PE.  Spoke to nursing home staff, patient last took Xarelto on 12/03/16 at 1800.  Baseline labs reviewed.  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  Patient was restarted on IV heparin during that admission.   Goal of Therapy:  Heparin level 0.3-0.7 units/ml Monitor platelets by anticoagulation protocol: Yes    Plan:  Check baseline aPTT and heparin level with AM labs At 1800, start heparin gtt at 1400 units/hr Check 8 hr aPTT/heparin level Daily heparin level, aPTT, CBC   Annalucia Laino D. Mina Marble, PharmD, BCPS Pager:  [phone] 12/04/2016, 3:37 AM

## 2016-12-05 ENCOUNTER — Ambulatory Visit: Payer: Medicare Other | Admitting: Vascular Surgery

## 2016-12-05 ENCOUNTER — Inpatient Hospital Stay (HOSPITAL_COMMUNITY): Payer: Medicare Other

## 2016-12-05 LAB — BASIC METABOLIC PANEL
Anion gap: 8 (ref 5–15)
BUN: 26 mg/dL — AB (ref 6–20)
CALCIUM: 7.6 mg/dL — AB (ref 8.9–10.3)
CO2: 22 mmol/L (ref 22–32)
CREATININE: 1.07 mg/dL (ref 0.61–1.24)
Chloride: 108 mmol/L (ref 101–111)
GFR calc Af Amer: >60 mL/min (ref >60)
GFR calc non Af Amer: >60 mL/min (ref >60)
GLUCOSE: 172 mg/dL — AB (ref 65–99)
Potassium: 3.4 mmol/L — ABNORMAL LOW (ref 3.5–5.1)
SODIUM: 138 mmol/L (ref 135–145)

## 2016-12-05 LAB — APTT
aPTT: 58 seconds — ABNORMAL HIGH (ref 24–36)
aPTT: 55 seconds — ABNORMAL HIGH (ref 24–36)
aPTT: 69 seconds — ABNORMAL HIGH (ref 24–36)

## 2016-12-05 LAB — CBC
HCT: 27.1 % — ABNORMAL LOW (ref 39.0–52.0)
Hemoglobin: 8.3 g/dL — ABNORMAL LOW (ref 13.0–17.0)
MCH: 23.6 pg — ABNORMAL LOW (ref 26.0–34.0)
MCHC: 30.6 g/dL (ref 30.0–36.0)
MCV: 77.2 fL — AB (ref 78.0–100.0)
PLATELETS: 245 10*3/uL (ref 150–400)
RBC: 3.51 MIL/uL — AB (ref 4.22–5.81)
RDW: 17.1 % — AB (ref 11.5–15.5)
WBC: 17.7 10*3/uL — AB (ref 4.0–10.5)

## 2016-12-05 LAB — GLUCOSE, CAPILLARY
GLUCOSE-CAPILLARY: 143 mg/dL — AB (ref 65–99)
GLUCOSE-CAPILLARY: 175 mg/dL — AB (ref 65–99)
GLUCOSE-CAPILLARY: 181 mg/dL — AB (ref 65–99)
Glucose-Capillary: 154 mg/dL — ABNORMAL HIGH (ref 65–99)
Glucose-Capillary: 172 mg/dL — ABNORMAL HIGH (ref 65–99)
Glucose-Capillary: 188 mg/dL — ABNORMAL HIGH (ref 65–99)
Glucose-Capillary: 230 mg/dL — ABNORMAL HIGH (ref 65–99)

## 2016-12-05 LAB — HEPARIN LEVEL (UNFRACTIONATED): HEPARIN UNFRACTIONATED: 0.87 [IU]/mL — AB (ref 0.30–0.70)

## 2016-12-05 MED ORDER — POTASSIUM CHLORIDE CRYS ER 20 MEQ PO TBCR
40.0000 meq | EXTENDED_RELEASE_TABLET | Freq: Once | ORAL | Status: AC
  Administered 2016-12-05: 40 meq via ORAL
  Filled 2016-12-05: qty 2

## 2016-12-05 MED ORDER — HEPARIN (PORCINE) IN NACL 100-0.45 UNIT/ML-% IJ SOLN
1750.0000 [IU]/h | INTRAMUSCULAR | Status: DC
  Administered 2016-12-05: 1750 [IU]/h via INTRAVENOUS
  Administered 2016-12-05: 1600 [IU]/h via INTRAVENOUS
  Filled 2016-12-05 (×3): qty 250

## 2016-12-05 MED ORDER — HYDROCORTISONE NA SUCCINATE PF 100 MG IJ SOLR
50.0000 mg | Freq: Two times a day (BID) | INTRAMUSCULAR | Status: DC
  Administered 2016-12-05 – 2016-12-06 (×2): 50 mg via INTRAVENOUS
  Filled 2016-12-05: qty 2
  Filled 2016-12-05: qty 1
  Filled 2016-12-05: qty 2

## 2016-12-05 MED ORDER — INSULIN ASPART 100 UNIT/ML ~~LOC~~ SOLN
0.0000 [IU] | Freq: Three times a day (TID) | SUBCUTANEOUS | Status: DC
  Administered 2016-12-05 (×2): 3 [IU] via SUBCUTANEOUS
  Administered 2016-12-06: 5 [IU] via SUBCUTANEOUS

## 2016-12-05 MED ORDER — HYDROCODONE-ACETAMINOPHEN 5-325 MG PO TABS
1.0000 | ORAL_TABLET | Freq: Four times a day (QID) | ORAL | Status: DC | PRN
Start: 2016-12-05 — End: 2016-12-09
  Administered 2016-12-05 – 2016-12-07 (×4): 1 via ORAL
  Filled 2016-12-05 (×4): qty 1

## 2016-12-05 NOTE — Progress Notes (Signed)
CSW attempted to reach out to pt's wife Butch Penny) to complete assessment. No answer at the number provided via chart and no VM was able to be left due to not being set up at this time. CSW will continue to reach family for assessment and further information.    [name], MSW, [facility] Clinical Social Worker [phone]

## 2016-12-05 NOTE — Progress Notes (Signed)
ANTICOAGULATION CONSULT NOTE - Initial Consult  Pharmacy Consult:  Heparin Indication:  History of PE  Allergies  Allergen Reactions  . Diazepam Anxiety    Induces crying  . Vancomycin Other (See Comments)    "red man syndrome"    Patient Measurements: Height: 5\' 8"  (172.7 cm) Weight: 229 lb 15 oz (104.3 kg) IBW/kg (Calculated) : 68.4 Heparin Dosing Weight: 97 kg  Vital Signs: Temp: 97.7 F (36.5 C) (09/14 0600) BP: 109/55 (09/14 0600) Pulse Rate: 74 (09/14 0600)  Labs:  Recent Labs  12/03/16 2253 12/04/16 0546 12/05/16 0543  HGB 12.1* 8.8* 8.3*  HCT 38.2* 28.9* 27.1*  PLT 138* 226 245  APTT  --  41* 58*  LABPROT  --  23.9*  --   INR  --  2.15  --   HEPARINUNFRC  --  >2.20* 0.87*  CREATININE 1.38* 1.52*  --     Estimated Creatinine Clearance: 47.7 mL/min (A) (by C-G formula based on SCr of 1.52 mg/dL (H)).    Assessment: 37 YOM with history of PE on Xarelto PTA.  Patient was transitioned to IV heparin on admission.  Currently using aPTT to guide heparin dosing since Xarelto falsely elevates heparin levels.  APTT slightly sub-therapeutic; no bleeding reported.   Goal of Therapy:  Heparin level 0.3-0.7 units/ml Monitor platelets by anticoagulation protocol: Yes    Plan:  Increase heparin gtt to 1600 units/hr Check 8 hr aPTT   Samamtha Tiegs D. Mina Marble, PharmD, BCPS Pager:  [phone] 12/05/2016, 6:15 AM

## 2016-12-05 NOTE — Progress Notes (Signed)
ANTICOAGULATION CONSULT NOTE - Follow Up Consult  Pharmacy Consult for Heparin Indication: History of PE  Allergies  Allergen Reactions  . Diazepam Anxiety    Induces crying  . Vancomycin Other (See Comments)    "red man syndrome"    Patient Measurements: Height: 5\' 11"  (180.3 cm) Weight: 229 lb 15 oz (104.3 kg) IBW/kg (Calculated) : 75.3 Heparin Dosing Weight: 97kg  Vital Signs: Temp: 97.6 F (36.4 C) (09/14 2303) Temp Source: Oral (09/14 2303) BP: 115/80 (09/14 2303) Pulse Rate: 87 (09/14 2303)  Labs:  Recent Labs  12/03/16 2253  12/04/16 0546 12/05/16 0543 12/05/16 1014 12/05/16 1211 12/05/16 2130  HGB 12.1*  --  8.8* 8.3*  --   --   --   HCT 38.2*  --  28.9* 27.1*  --   --   --   PLT 138*  --  226 245  --   --   --   APTT  --   < > 41* 58*  --  55* 69*  LABPROT  --   --  23.9*  --   --   --   --   INR  --   --  2.15  --   --   --   --   HEPARINUNFRC  --   --  >2.20* 0.87*  --   --   --   CREATININE 1.38*  --  1.52*  --  1.07  --   --   < > = values in this interval not displayed.  Estimated Creatinine Clearance: 71.1 mL/min (by C-G formula based on SCr of 1.07 mg/dL).   Medications:  Heparin @ 1750 units/hr  Assessment: 75yom on xarelto pta for hx PE, transitioned to IV heparin on admission. Last xarelto dose 9/12 @ 1800. Using APTTs to guide heparin dosing as xarelto falsely elevates heparin levels. APTT is finally therapeutic at 69 seconds.   Goal of Therapy:  Heparin level 0.3-0.7 aPTT 66-102 seconds Monitor platelets by anticoagulation protocol: Yes   Plan:  1) Continue heparin at 1750 units/hr 2) Follow up daily APTT, heparin level, CBC  Deboraha Sprang 12/05/2016,11:16 PM

## 2016-12-05 NOTE — Progress Notes (Signed)
PULMONARY / CRITICAL CARE MEDICINE   Name: [name] MRN: [id number] DOB: Oct 04, 1940    ADMISSION DATE:  12/03/2016 CONSULTATION DATE:  12/04/16  REFERRING MD:  Christy Gentles  CHIEF COMPLAINT:  AMS  HISTORY OF PRESENT ILLNESS:  Pt is encephelopathic; therefore, this HPI is obtained from chart review. [name] is a 76 y.o. male with PMH as outlined below and who is bedbound and resides in a SNF. 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  It has been recommended that he have AKA to which she was initially reluctant. Other options presented at the time were hospice. Per ID notes from 12/03/16, patient now agreeable to right AKA and he is pending reevaluation by Dr. Donzetta Matters of vascular surgery.  He was brought to [facility] ED 12/03/16 with AMS and hypotension. BP in ED ranging anywhere from systolic of 40J to 81X, lactate normal. Urine suggestive of UTI. Per RN, when Foley was placed, urine was very thick and had purulent material within it.  He received 3.5 L IV fluids; however, remained hypotensive. Of note he has a limited CODE BLUE and per EDP's conversations with pt's wife, vasopressors are OK.  SUBJECTIVE:   CVL placed yesterday Pressor requirements--> off  VITAL SIGNS: BP (!) 110/57   Pulse 73   Temp 97.7 F (36.5 C)   Resp (!) 22   Ht 5\' 8"  (1.727 m)   Wt 229 lb 15 oz (104.3 kg)   SpO2 95%   BMI 34.96 kg/m  1.5 liters  INTAKE / OUTPUT:  Intake/Output Summary (Last 24 hours) at 12/05/16 0926 Last data filed at 12/05/16 0900  Gross per 24 hour  Intake          4589.93 ml  Output             2850 ml  Net          1739.93 ml    PHYSICAL EXAMINATION: General appearance:  76 Year old  Male, chronically ill appearing.    NAD, conversant  Eyes: anicteric sclerae , moist conjunctivae; PERRL, EOMI  bilaterally. Mouth:  Moist membranes and no mucosal ulcerations; normal hard and soft palate Neck: Trachea midline; neck supple, no JVD Lungs/chest: CTA, with normal respiratory effort and no intercostal retractions CV: RRR, no MRGs  Abdomen: Soft, non-tender; no masses or HSM Extremities:+ peripheral edema right leg is cool. No pain Skin: Normal temperature, turgor and texture; no rash, ulcers or subcutaneous nodules Psych: Appropriate affect, alert and oriented to person, place and time LABS:  BMET  Recent Labs Lab 12/03/16 2253 12/04/16 0546  NA 132* 133*  K 3.2* 3.6  CL 101 100*  CO2 22 24  BUN 30* 32*  CREATININE 1.38* 1.52*  GLUCOSE 125* 151*    Electrolytes  Recent Labs Lab 12/03/16 2253 12/04/16 0546  CALCIUM 6.4* 7.7*  MG  --  1.8  PHOS  --  4.9*    CBC  Recent Labs Lab 12/03/16 2253 12/04/16 0546 12/05/16 0543  WBC 15.0* 20.9* 17.7*  HGB 12.1* 8.8* 8.3*  HCT 38.2* 28.9* 27.1*  PLT 138* 226 245    Coag's  Recent Labs Lab 12/04/16 0546 12/05/16 0543  APTT 41* 58*  INR 2.15  --     Sepsis Markers  Recent Labs Lab 12/03/16 2307  LATICACIDVEN 1.29    ABG  Recent Labs Lab 12/03/16 2340  PHART 7.443  PCO2ART 43.3  PO2ART 77.0*    Liver Enzymes  Recent Labs Lab 12/03/16 2253  AST 25  ALT 24  ALKPHOS 97  BILITOT 0.5  ALBUMIN 1.6*    Cardiac Enzymes No results for input(s): TROPONINI, PROBNP in the last 168 hours.  Glucose  Recent Labs Lab 12/04/16 1105 12/04/16 1620 12/04/16 1931 12/05/16 0037 12/05/16 0403 12/05/16 0708  GLUCAP 151* 187* 188* 154* 143* 172*    Imaging Dg Chest Port 1 View  Result Date: 12/05/2016 CLINICAL DATA:  Respiratory failure. EXAM: PORTABLE CHEST 1 VIEW COMPARISON:  12/04/2016. FINDINGS: Right IJ line in stable position. Prior CABG. Cardiomegaly with pulmonary vascular prominence and bilateral interstitial prominence with small left pleural effusion. Progressive bibasilar  atelectasis. Prior cervical spine fusion . IMPRESSION: 1.  Right IJ line stable position. 2. Prior CABG. Cardiomegaly with pulmonary venous congestion bilateral interstitial prominence consistent with congestive heart failure. Small left pleural effusion. 3.  Progressive bibasilar atelectasis . Electronically Signed   By: Marcello Moores  Register   On: 12/05/2016 06:28   Dg Chest Port 1 View  Result Date: 12/04/2016 CLINICAL DATA:  Central line placement EXAM: PORTABLE CHEST 1 VIEW COMPARISON:  Yesterday FINDINGS: Right IJ central line with tip at the upper SVC. No evidence of pneumothorax. No detectable new mediastinal widening, limited by rotation. There is chronic cardiomegaly and vascular pedicle widening. The patient is status post CABG. Low volume chest with streaky left perihilar and bibasilar densities. No Kerley lines or visible effusion. IMPRESSION: 1. New central line with tip near the upper SVC. Negative for pneumothorax. 2. Low volume chest with atelectatic type opacities. Electronically Signed   By: Monte Fantasia M.D.   On: 12/04/2016 14:06     STUDIES:  CT A/P 9/12 > no acute process. Mild to moderate bilateral hydronephrosis greater on the right, improved since prior exam. Angiomyolipoma of the right kidney. Echo 9/13 >  CULTURES: Blood 9/12 >  Urine 9/12 >   ANTIBIOTICS: Vanc 9/12 >  Merem 9/12 >   SIGNIFICANT EVENTS: 9/13 > admit.  LINES/TUBES: None.  DISCUSSION: 76 y.o. male from nursing home admitted 9/13 with AMS and hypotension, presumably due to urosepsis (had contaminated appearing urine). Now off pressors. Unfortunately no urine sent. Ddx could be his Osteo (but blood cultures neg) -he is off pressors For today: Cont abx Adv diet Move to SDU Will reach out to vascular re: timing AKA -->Dr Donzetta Matters will see around Monday if still here.   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 ->no urine sent ->now off pressors Plan:  Cont tele KVO IVFs Day 2  Meropenem and zyvox; f/u cultures  Titrate stress dose steroids once off pressors  Hx PVD, MI, HTN, HLD, CAD Plan Cont pre-admit asa and atorvastatin  Holding coreg and lasix  Chronic right calcaneal osteo - seen by ID, recommending AKA.-->BC negative so don't think this is source.  Plan Will contact vascular.  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 Chronic hypoxic respiratory failure - on 1.5 L oxygen. Plan:   Cont heparin gtt; resume xarelto when renal fxn improves and shock resolved.  Wean oxygen  Cont pulse ox  AoCKD - ? Chronic hydro per  CT; improved on CT 9/12 compared to prior imaging. Hyponatremia - presumably hypovolemic. Hypokalemia. Hypocalcemia Plan:   Repeat chemistry Strict I&O Renal dose meds   Hx GERD, diverticulosis. Plan Adv diet  PPI  Mild thrombocytopenia. Plan:  Trend cbc Transfuse per protocol   Hx DM. Plan   ssi    Acute encephalopathy - presumably due to sepsis.  Unclear of baseline mental status. Hx anxiety, depression, falls-->resovled  Plan Holding his pre-admission meds  Cont supportive care   Family updated: None available.  EDP discussed code status with pt's wife.  DNR (no CPR / ACLS / Intubation); though, vasopressors OK.  Interdisciplinary Family Meeting v Palliative Care Meeting:  Due by: 12/11/16.  Erick Colace ACNP-BC [place] Pager # [phone] OR # [phone] if no answer

## 2016-12-05 NOTE — Clinical Social Work Note (Signed)
Clinical Social Work Assessment  Patient Details  Name: [name] MRN: [id number] Date of Birth: 1940-09-23  Date of referral:  12/05/16               Reason for consult:  Facility Placement (pt is from [facility] )                Permission sought to share information with:    Permission granted to share information::     Name::        Agency::     Relationship::     Contact Information:     Housing/Transportation Living arrangements for the past 2 months:  [place] Source of Information:  Patient Patient Interpreter Needed:  None Criminal Activity/Legal Involvement Pertinent to Current Situation/Hospitalization:    Significant Relationships:  Adult Children, Spouse Lives with:  Facility Resident Do you feel safe going back to the place where you live?  Yes Need for family participation in patient care:  Yes (Comment)  Care giving concerns:  CSW spoke with pt at bedside., At this time pt has not presented any concerns to CSW.    Social Worker assessment / plan:  CSW spoke with pt at bedside. During this time pt reports that pt has been living at [facility] for the past 2 years. Pt expresses that pt is okay with it and  Hopes to return there once discharged. Pt reports that supports include pt's spouse, daughter, bother and sister in law. Pt's family comes to visit pt in the facility and make sure that pt is getting the care that pt needs.   Employment status:  Retired Nurse, adult PT Recommendations:  Not assessed at this time [place] / Referral to community resources:  [place]  Patient/Family's Response to care:  Pt is understanding and agreeable to plan of care at this time.   Patient/Family's Understanding of and Emotional Response to Diagnosis, Current Treatment, and Prognosis: No further questions or concerns at this time have been presented to CSW.   Emotional  Assessment Appearance:  Appears stated age Attitude/Demeanor/Rapport:    Affect (typically observed):  Accepting, Calm, Pleasant Orientation:  Oriented to Self, Oriented to Place, Oriented to  Time, Oriented to Situation Alcohol / Substance use:  Not Applicable Psych involvement (Current and /or in the community):  No (Comment)  Discharge Needs  Concerns to be addressed:  No discharge needs identified Readmission within the last 30 days:  No Current discharge risk:  None Barriers to Discharge:  No Barriers Identified   Wetzel Bjornstad, [place] 12/05/2016, 9:20 AM

## 2016-12-05 NOTE — Progress Notes (Signed)
ANTICOAGULATION CONSULT NOTE - Initial Consult  Pharmacy Consult:  Heparin Indication:  History of PE  Allergies  Allergen Reactions  . Diazepam Anxiety    Induces crying  . Vancomycin Other (See Comments)    "red man syndrome"    Patient Measurements: Height: 5\' 11"  (180.3 cm) Weight: 229 lb 15 oz (104.3 kg) IBW/kg (Calculated) : 75.3 Heparin Dosing Weight: 97 kg  Vital Signs: Temp: 97.9 F (36.6 C) (09/14 1215) Temp Source: Oral (09/14 1215) BP: 110/51 (09/14 1215) Pulse Rate: 75 (09/14 1215)  Labs:  Recent Labs  12/03/16 2253 12/04/16 0546 12/05/16 0543 12/05/16 1014 12/05/16 1211  HGB 12.1* 8.8* 8.3*  --   --   HCT 38.2* 28.9* 27.1*  --   --   PLT 138* 226 245  --   --   APTT  --  41* 58*  --  55*  LABPROT  --  23.9*  --   --   --   INR  --  2.15  --   --   --   HEPARINUNFRC  --  >2.20* 0.87*  --   --   CREATININE 1.38* 1.52*  --  1.07  --     Estimated Creatinine Clearance: 71.1 mL/min (by C-G formula based on SCr of 1.07 mg/dL).    Assessment: 3 YOM with history of PE on Xarelto PTA.  Patient was transitioned to IV heparin on admission.  Currently using aPTT to guide heparin dosing since Xarelto falsely elevates heparin levels.  APTT remains slightly sub-therapeutic despite increase and there were no stoppages in transfer per 47M/2C nurses; No bleeding reported at this time.   Goal of Therapy:  Heparin level 0.3-0.7 units/ml Monitor platelets by anticoagulation protocol: Yes    Plan:  Increase heparin gtt to 1750 units/hr F/u 8 hour aPTT level Monitor daily CBC, s/s bleeding   Bertis Ruddy, PharmD Pharmacy Resident Pager #: [phone] 12/05/2016 1:34 PM

## 2016-12-06 LAB — CBC
HCT: 27.6 % — ABNORMAL LOW (ref 39.0–52.0)
HEMOGLOBIN: 8.4 g/dL — AB (ref 13.0–17.0)
MCH: 23.5 pg — ABNORMAL LOW (ref 26.0–34.0)
MCHC: 30.4 g/dL (ref 30.0–36.0)
MCV: 77.3 fL — ABNORMAL LOW (ref 78.0–100.0)
Platelets: 274 10*3/uL (ref 150–400)
RBC: 3.57 MIL/uL — AB (ref 4.22–5.81)
RDW: 17.1 % — ABNORMAL HIGH (ref 11.5–15.5)
WBC: 11.6 10*3/uL — ABNORMAL HIGH (ref 4.0–10.5)

## 2016-12-06 LAB — GLUCOSE, CAPILLARY
GLUCOSE-CAPILLARY: 219 mg/dL — AB (ref 65–99)
GLUCOSE-CAPILLARY: 145 mg/dL — AB (ref 65–99)
Glucose-Capillary: 231 mg/dL — ABNORMAL HIGH (ref 65–99)
Glucose-Capillary: 151 mg/dL — ABNORMAL HIGH (ref 65–99)

## 2016-12-06 LAB — BASIC METABOLIC PANEL
Anion gap: 6 (ref 5–15)
BUN: 21 mg/dL — ABNORMAL HIGH (ref 6–20)
CHLORIDE: 109 mmol/L (ref 101–111)
CO2: 22 mmol/L (ref 22–32)
Calcium: 7.7 mg/dL — ABNORMAL LOW (ref 8.9–10.3)
Creatinine, Ser: 0.84 mg/dL (ref 0.61–1.24)
GFR calc non Af Amer: >60 mL/min (ref >60)
Glucose, Bld: 222 mg/dL — ABNORMAL HIGH (ref 65–99)
POTASSIUM: 3.6 mmol/L (ref 3.5–5.1)
SODIUM: 137 mmol/L (ref 135–145)

## 2016-12-06 LAB — HEPARIN LEVEL (UNFRACTIONATED): HEPARIN UNFRACTIONATED: 0.49 [IU]/mL (ref 0.30–0.70)

## 2016-12-06 LAB — APTT: APTT: 88 s — AB (ref 24–36)

## 2016-12-06 MED ORDER — CARVEDILOL 3.125 MG PO TABS
3.1250 mg | ORAL_TABLET | Freq: Two times a day (BID) | ORAL | Status: DC
  Administered 2016-12-06 – 2016-12-10 (×8): 3.125 mg via ORAL
  Filled 2016-12-06 (×9): qty 1

## 2016-12-06 MED ORDER — TAMSULOSIN HCL 0.4 MG PO CAPS
0.4000 mg | ORAL_CAPSULE | Freq: Every day | ORAL | Status: DC
  Administered 2016-12-06 – 2016-12-10 (×5): 0.4 mg via ORAL
  Filled 2016-12-06 (×5): qty 1

## 2016-12-06 MED ORDER — TRAZODONE HCL 50 MG PO TABS
50.0000 mg | ORAL_TABLET | Freq: Every day | ORAL | Status: DC
  Administered 2016-12-06 – 2016-12-10 (×5): 50 mg via ORAL
  Filled 2016-12-06 (×5): qty 1

## 2016-12-06 MED ORDER — GABAPENTIN 600 MG PO TABS
600.0000 mg | ORAL_TABLET | Freq: Three times a day (TID) | ORAL | Status: DC
  Administered 2016-12-06 – 2016-12-11 (×17): 600 mg via ORAL
  Filled 2016-12-06 (×18): qty 1

## 2016-12-06 MED ORDER — SODIUM CHLORIDE 0.9% FLUSH
10.0000 mL | Freq: Two times a day (BID) | INTRAVENOUS | Status: DC
  Administered 2016-12-06 – 2016-12-10 (×8): 10 mL

## 2016-12-06 MED ORDER — ATORVASTATIN CALCIUM 20 MG PO TABS: 20.0000 mg | ORAL_TABLET | Freq: Every day | ORAL | Status: DC

## 2016-12-06 MED ORDER — DULOXETINE HCL 60 MG PO CPEP: 90.0000 mg | ORAL_CAPSULE | Freq: Every day | ORAL | Status: DC

## 2016-12-06 MED ORDER — FUROSEMIDE 80 MG PO TABS
80.0000 mg | ORAL_TABLET | Freq: Two times a day (BID) | ORAL | Status: DC
  Administered 2016-12-06 – 2016-12-10 (×8): 80 mg via ORAL
  Filled 2016-12-06 (×8): qty 1

## 2016-12-06 MED ORDER — OXYCODONE HCL 5 MG PO TABS: 5.0000 mg | ORAL_TABLET | Freq: Three times a day (TID) | ORAL | Status: DC | PRN

## 2016-12-06 MED ORDER — INSULIN ASPART 100 UNIT/ML ~~LOC~~ SOLN
0.0000 [IU] | Freq: Three times a day (TID) | SUBCUTANEOUS | Status: DC
  Administered 2016-12-06: 7 [IU] via SUBCUTANEOUS
  Administered 2016-12-06: 4 [IU] via SUBCUTANEOUS
  Administered 2016-12-07: 3 [IU] via SUBCUTANEOUS
  Administered 2016-12-07 – 2016-12-09 (×3): 4 [IU] via SUBCUTANEOUS
  Administered 2016-12-10: 2 [IU] via SUBCUTANEOUS
  Administered 2016-12-11: 3 [IU] via SUBCUTANEOUS

## 2016-12-06 MED ORDER — DULOXETINE HCL 60 MG PO CPEP
60.0000 mg | ORAL_CAPSULE | Freq: Every day | ORAL | Status: DC
  Administered 2016-12-07 – 2016-12-11 (×5): 60 mg via ORAL
  Filled 2016-12-06 (×6): qty 1

## 2016-12-06 MED ORDER — SENNA 8.6 MG PO TABS
17.2000 mg | ORAL_TABLET | Freq: Two times a day (BID) | ORAL | Status: DC
  Administered 2016-12-06 – 2016-12-11 (×8): 17.2 mg via ORAL
  Filled 2016-12-06 (×9): qty 2

## 2016-12-06 MED ORDER — INSULIN NPH (HUMAN) (ISOPHANE) 100 UNIT/ML ~~LOC~~ SUSP
10.0000 [IU] | Freq: Two times a day (BID) | SUBCUTANEOUS | Status: DC
  Administered 2016-12-06 – 2016-12-11 (×10): 10 [IU] via SUBCUTANEOUS
  Filled 2016-12-06 (×2): qty 10

## 2016-12-06 MED ORDER — POLYVINYL ALCOHOL 1.4 % OP SOLN
1.0000 [drp] | Freq: Two times a day (BID) | OPHTHALMIC | Status: DC
  Administered 2016-12-06 – 2016-12-10 (×7): 1 [drp] via OPHTHALMIC
  Filled 2016-12-06: qty 15

## 2016-12-06 MED ORDER — ENOXAPARIN SODIUM 120 MG/0.8ML ~~LOC~~ SOLN
110.0000 mg | Freq: Two times a day (BID) | SUBCUTANEOUS | Status: DC
  Administered 2016-12-06 – 2016-12-08 (×5): 110 mg via SUBCUTANEOUS
  Filled 2016-12-06 (×6): qty 0.73

## 2016-12-06 MED ORDER — PANTOPRAZOLE SODIUM 40 MG PO TBEC
40.0000 mg | DELAYED_RELEASE_TABLET | Freq: Every day | ORAL | Status: DC
  Administered 2016-12-06 – 2016-12-11 (×6): 40 mg via ORAL
  Filled 2016-12-06 (×6): qty 1

## 2016-12-06 MED ORDER — RIVAROXABAN 20 MG PO TABS: 20.0000 mg | ORAL_TABLET | Freq: Every day | ORAL | Status: DC

## 2016-12-06 MED ORDER — PROPYLENE GLYCOL 0.6 % OP SOLN
1.0000 [drp] | Freq: Two times a day (BID) | OPHTHALMIC | Status: DC
Start: 2016-12-06 — End: 2016-12-06

## 2016-12-06 MED ORDER — HYDROCORTISONE ACE-PRAMOXINE 1-1 % RE FOAM
1.0000 | Freq: Two times a day (BID) | RECTAL | Status: DC | PRN
  Filled 2016-12-06: qty 10

## 2016-12-06 MED ORDER — LORAZEPAM 0.5 MG PO TABS
0.5000 mg | ORAL_TABLET | ORAL | Status: DC | PRN
  Administered 2016-12-06 – 2016-12-08 (×2): 0.5 mg via ORAL
  Filled 2016-12-06 (×3): qty 1

## 2016-12-06 MED ORDER — DOCUSATE SODIUM 100 MG PO CAPS
100.0000 mg | ORAL_CAPSULE | Freq: Two times a day (BID) | ORAL | Status: DC
  Administered 2016-12-06 – 2016-12-11 (×10): 100 mg via ORAL
  Filled 2016-12-06 (×10): qty 1

## 2016-12-06 MED ORDER — SODIUM CHLORIDE 0.9% FLUSH: 10.0000 mL | INTRAVENOUS | Status: DC | PRN

## 2016-12-06 MED ORDER — INSULIN ASPART 100 UNIT/ML ~~LOC~~ SOLN: 0.0000 [IU] | Freq: Every day | SUBCUTANEOUS | Status: DC

## 2016-12-06 MED ORDER — POTASSIUM CHLORIDE CRYS ER 20 MEQ PO TBCR
20.0000 meq | EXTENDED_RELEASE_TABLET | Freq: Every day | ORAL | Status: DC
  Filled 2016-12-06: qty 1

## 2016-12-06 MED ORDER — LUBIPROSTONE 8 MCG PO CAPS
8.0000 ug | ORAL_CAPSULE | Freq: Two times a day (BID) | ORAL | Status: DC
  Administered 2016-12-06 – 2016-12-11 (×10): 8 ug via ORAL
  Filled 2016-12-06 (×13): qty 1

## 2016-12-06 NOTE — Progress Notes (Addendum)
Pharmacy Antibiotic Note  [name] is a 76 y.o. male admitted on 12/03/2016 with sepsis.  Pharmacy has been consulted for Linezolid and meropenem dosing.  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  Scr improving today.  Plan: Continue linezolid 600 mg q 12 hrs Continue meropenem 1g q 8 hrs LOT abx?  Height: 5\' 11"  (180.3 cm) Weight: 241 lb 1.6 oz (109.4 kg) IBW/kg (Calculated) : 75.3  Temp (24hrs), Avg:97.7 F (36.5 C), Min:97.5 F (36.4 C), Max:97.9 F (36.6 C)   Recent Labs Lab 12/03/16 2253 12/03/16 2307 12/04/16 0546 12/05/16 0543 12/05/16 1014 12/06/16 0531  WBC 15.0*  --  20.9* 17.7*  --  11.6*  CREATININE 1.38*  --  1.52*  --  1.07 0.84  LATICACIDVEN  --  1.29  --   --   --   --     Estimated Creatinine Clearance: 92.6 mL/min (by C-G formula based on SCr of 0.84 mg/dL).    Allergies  Allergen Reactions  . Diazepam Anxiety    Induces crying  . Vancomycin Other (See Comments)    "red man syndrome"   Antimicrobials this admission:  Vanc 9/12>9/13 Zyvox 9/13>> Merrem 9/12> Zosyn 9/12 x 1  Dose adjustments this admission:  N/a  Microbiology results:  BCx 9/12>pend MRSA PCR 9/13>negative   Thank you for allowing pharmacy to be a part of this patient's care.  Uvaldo Rising, BCPS  Clinical Pharmacist Pager [phone]  12/06/2016 8:18 AM

## 2016-12-06 NOTE — Progress Notes (Addendum)
[place] TEAM 1 - Stepdown/ICU TEAM  [name]  VOZ:[id number] DOB: 05-27-1940 DOA: 12/03/2016 PCP: System, Provider Not In    Brief Narrative:  76 y.o. male who is bedbound and resides in a SNF. 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  It has been recommended that he have and AKA but he has been reluctant. Per ID notes from 12/03/16, patient now agreeable to AKA and is pending re-evaluation by Dr. Donzetta Matters.  He was brought to [facility] ED 12/03/16 with AMS and hypotension. BP in ED ranging anywhere from systolic of 60 to 42V, lactate normal. Urine suggestive of UTI. Per RN, when Foley was placed, urine was very thick and had purulent material within it.  He received 3.5 L IV fluids but remained hypotensive.   Significant Events: 9/12 admit w/ septic shock  9/13 CVL placed   Subjective: The patient tells me he feels very anxious today.  He feels discomfort on his "backside" but denies chest pain shortness of breath nausea vomiting or abdominal pain.  He is alert and oriented.  Assessment & Plan:  Shock due to Pyelonephritis  Shock resolved - continue empiric antibiotic - unfortunately urine culture was not obtained at admission - complete empiric 7 day treatment course  Combined Systolic and diastolic CHF EF 95-63% w/ grade 2 DD and diffuse hypokinesis - decrease from 30-35% July 2017 - follow daily weights and Is/Os - baseline weights appears to be ~104kg - resume diuresis and follow now that BP stable   Filed Weights   12/05/16 0428 12/05/16 1215 12/06/16 0600  Weight: 104.3 kg (229 lb 15 oz) 104.3 kg (229 lb 15 oz) 109.4 kg (241 lb 1.6 oz)    PVD - Chronic right calcaneal osteo  S/p L LE AKA - Vasc Surgery (Dr. Donzetta Matters) to see on Monday to consider R AKA  HTN Climbing s/p volume resuscitation - resume home meds - resume diuretic   HLD Cont atorvastatin  CAD Cont ASA  - resume coreg today   Chronic PE on chronic Xarelto - cont heparin gtt was being utilized while acutely ill and pending AKA - change to lovenox today   Chronic hypoxic respiratory failure  Only requiring 2L [place] support presently   Acute on CKD ?chronic hydro per CT > improved on CT 9/12 compared to prior imaging - renal fxn has essentially normalized w/ volume expansion - follow w/ resumption of diuretic   Recent Labs Lab 12/03/16 2253 12/04/16 0546 12/05/16 1014 12/06/16 0531  CREATININE 1.38* 1.52* 1.07 0.84   Hyponatremia - presumably hypovolemic. Corrected with volume resuscitation  GERD PPI  DM CBG poorly controlled - adjust tx and follow  Acute encephalopathy presumably due to sepsis - unclear of baseline mental status - appears to be much improved today  Obesity - Body mass index is 33.63 kg/m.    DVT prophylaxis: heparin gtt > lovenox  Code Status: DNR - NO CODE BLUE (pressors and BIPAP ok) Family Communication: no family present at time of exam  Disposition Plan: SDU - eventual return to SNF   Consultants:  PCCM Vasc Surgery   Antimicrobials:  Vanc 9/12  Zosyn 9/12 Merem 9/12 >  Linezolid 9/13 >  Objective: Blood pressure (!) 134/57, pulse 79, temperature 97.6 F (36.4 C), temperature source Oral, resp. rate 18, height 5\' 11"  (1.803 m), weight 109.4 kg (241 lb 1.6  oz), SpO2 93 %.  Intake/Output Summary (Last 24 hours) at 12/06/16 0908 Last data filed at 12/06/16 0830  Gross per 24 hour  Intake          4468.35 ml  Output             1675 ml  Net          2793.35 ml   Filed Weights   12/05/16 0428 12/05/16 1215 12/06/16 0600  Weight: 104.3 kg (229 lb 15 oz) 104.3 kg (229 lb 15 oz) 109.4 kg (241 lb 1.6 oz)    Examination: General: No acute respiratory distress - alert but anxious  Lungs: Clear to auscultation bilaterally without wheezes or crackles - distant BS th/o  Cardiovascular: Regular rate and rhythm without murmur gallop or rub    Abdomen: Nontender, obese, soft, bowel sounds positive, no rebound, no ascites, no appreciable mass Extremities: No significant cyanosis, clubbing, or edema bilateral lower extremities  CBC:  Recent Labs Lab 12/03/16 2253 12/04/16 0546 12/05/16 0543 12/06/16 0531  WBC 15.0* 20.9* 17.7* 11.6*  HGB 12.1* 8.8* 8.3* 8.4*  HCT 38.2* 28.9* 27.1* 27.6*  MCV 75.6* 76.7* 77.2* 77.3*  PLT 138* 226 245 761   Basic Metabolic Panel:  Recent Labs Lab 12/03/16 2253 12/04/16 0546 12/05/16 1014 12/06/16 0531  NA 132* 133* 138 137  K 3.2* 3.6 3.4* 3.6  CL 101 100* 108 109  CO2 22 24 22 22   GLUCOSE 125* 151* 172* 222*  BUN 30* 32* 26* 21*  CREATININE 1.38* 1.52* 1.07 0.84  CALCIUM 6.4* 7.7* 7.6* 7.7*  MG  --  1.8  --   --   PHOS  --  4.9*  --   --    GFR: Estimated Creatinine Clearance: 92.6 mL/min (by C-G formula based on SCr of 0.84 mg/dL).  Liver Function Tests:  Recent Labs Lab 12/03/16 2253  AST 25  ALT 24  ALKPHOS 97  BILITOT 0.5  PROT 4.8*  ALBUMIN 1.6*    Coagulation Profile:  Recent Labs Lab 12/04/16 0546  INR 2.15    HbA1C: Hemoglobin A1C  Date/Time Value Ref Range Status  04/25/2016 6.2  Final   Hgb A1c MFr Bld  Date/Time Value Ref Range Status  11/01/2015 11:34 AM 6.4 (H) 4.8 - 5.6 % Final    Comment:    (NOTE)         Pre-diabetes: 5.7 - 6.4         Diabetes: >6.4         Glycemic control for adults with diabetes: <7.0   05/10/2014 05:25 AM 7.4 (H) 4.8 - 5.6 % Final    Comment:    (NOTE)         Pre-diabetes: 5.7 - 6.4         Diabetes: >6.4         Glycemic control for adults with diabetes: <7.0     CBG:  Recent Labs Lab 12/05/16 1127 12/05/16 1222 12/05/16 1715 12/05/16 2102 12/06/16 0827  GLUCAP 175* 188* 181* 230* 231*    Recent Results (from the past 240 hour(s))  Blood Culture (routine x 2)     Status: None (Preliminary result)   Collection Time: 12/03/16 10:52 PM  Result Value Ref Range Status   Specimen Description  BLOOD LEFT FOREARM  Final   Special Requests   Final    BOTTLES DRAWN AEROBIC ONLY Blood Culture adequate volume   Culture NO GROWTH 1 DAY  Final   Report  Status PENDING  Incomplete  Blood Culture (routine x 2)     Status: None (Preliminary result)   Collection Time: 12/03/16 11:40 PM  Result Value Ref Range Status   Specimen Description BLOOD RIGHT ANTECUBITAL  Final   Special Requests   Final    BOTTLES DRAWN AEROBIC AND ANAEROBIC Blood Culture adequate volume   Culture NO GROWTH 1 DAY  Final   Report Status PENDING  Incomplete  MRSA PCR Screening     Status: None   Collection Time: 12/04/16  4:11 AM  Result Value Ref Range Status   MRSA by PCR NEGATIVE NEGATIVE Final    Comment:        The GeneXpert MRSA Assay (FDA approved for NASAL specimens only), is one component of a comprehensive MRSA colonization surveillance program. It is not intended to diagnose MRSA infection nor to guide or monitor treatment for MRSA infections.      Scheduled Meds: . aspirin  81 mg Oral Daily  . atorvastatin  20 mg Oral q1800  . diphenhydrAMINE  25 mg Intravenous Once  . hydrocortisone sod succinate (SOLU-CORTEF) inj  50 mg Intravenous Q12H  . insulin aspart  0-15 Units Subcutaneous TID AC  . pantoprazole (PROTONIX) IV  40 mg Intravenous Q24H     LOS: 2 days   Cherene Altes, MD [facility] Office  [phone] Pager - Text Page per Amion as per below:  On-Call/Text Page:      [email or web address]      password [email or web address]  If 7PM-7AM, please contact night-coverage [email or web address] Password [email or web address] 12/06/2016, 9:08 AM

## 2016-12-06 NOTE — Progress Notes (Addendum)
ANTICOAGULATION CONSULT NOTE - Follow Up Consult  Pharmacy Consult for Heparin > transition to Lovenox Indication: History of PE  Allergies  Allergen Reactions  . Diazepam Anxiety    Induces crying  . Vancomycin Other (See Comments)    "red man syndrome"    Patient Measurements: Height: 5\' 11"  (180.3 cm) Weight: 241 lb 1.6 oz (109.4 kg) IBW/kg (Calculated) : 75.3 Heparin Dosing Weight: 97kg  Vital Signs: Temp: 97.9 F (36.6 C) (09/15 0337) Temp Source: Oral (09/15 0337) BP: 125/57 (09/15 0337) Pulse Rate: 82 (09/15 0400)  Labs:  Recent Labs  12/04/16 0546 12/05/16 0543 12/05/16 1014 12/05/16 1211 12/05/16 2130 12/06/16 0531  HGB 8.8* 8.3*  --   --   --  8.4*  HCT 28.9* 27.1*  --   --   --  27.6*  PLT 226 245  --   --   --  274  APTT 41* 58*  --  55* 69* 88*  LABPROT 23.9*  --   --   --   --   --   INR 2.15  --   --   --   --   --   HEPARINUNFRC >2.20* 0.87*  --   --   --  0.49  CREATININE 1.52*  --  1.07  --   --  0.84    Estimated Creatinine Clearance: 92.6 mL/min (by C-G formula based on SCr of 0.84 mg/dL).   Medications:  . sodium chloride 100 mL/hr at 12/06/16 0622  . sodium chloride    . heparin 1,750 Units/hr (12/06/16 0622)  . linezolid (ZYVOX) IV Stopped (12/05/16 2345)  . meropenem (MERREM) IV Stopped (12/06/16 0640)    Assessment: 75yom on xarelto pta for hx PE, transitioned to IV heparin on admission. Last xarelto dose 9/12 @ 1800. Using APTTs to guide heparin dosing as xarelto falsely elevates heparin levels. APTT remains therapeutic at 88 this AM.  Heparin level also beginning to correlate (0.49).  CBC stable.  No overt bleeding or complications noted.   Pharmacy asked to change to Lovenox today.  Awaiting decision on AKA.  Goal of Therapy:  Monitor platelets by anticoagulation protocol: Yes   Plan:  1) D/c IV heparin now. 2) In 1 hr, start Lovenox 110 mg sq q 12 hrs. 3) CBC q 72 hrs while on Lovenox.  Uvaldo Rising, BCPS   Clinical Pharmacist Pager [phone]  12/06/2016 8:21 AM

## 2016-12-07 LAB — GLUCOSE, CAPILLARY
GLUCOSE-CAPILLARY: 151 mg/dL — AB (ref 65–99)
GLUCOSE-CAPILLARY: 133 mg/dL — AB (ref 65–99)
Glucose-Capillary: 136 mg/dL — ABNORMAL HIGH (ref 65–99)
Glucose-Capillary: 104 mg/dL — ABNORMAL HIGH (ref 65–99)

## 2016-12-07 LAB — COMPREHENSIVE METABOLIC PANEL
ALBUMIN: 1.8 g/dL — AB (ref 3.5–5.0)
ALK PHOS: 75 U/L (ref 38–126)
ALT: 24 U/L (ref 17–63)
AST: 22 U/L (ref 15–41)
Anion gap: 5 (ref 5–15)
BILIRUBIN TOTAL: 0.6 mg/dL (ref 0.3–1.2)
BUN: 16 mg/dL (ref 6–20)
CALCIUM: 7.8 mg/dL — AB (ref 8.9–10.3)
CO2: 26 mmol/L (ref 22–32)
CREATININE: 0.81 mg/dL (ref 0.61–1.24)
Chloride: 111 mmol/L (ref 101–111)
GFR calc Af Amer: >60 mL/min (ref >60)
GLUCOSE: 150 mg/dL — AB (ref 65–99)
Potassium: 2.9 mmol/L — ABNORMAL LOW (ref 3.5–5.1)
Sodium: 142 mmol/L (ref 135–145)
TOTAL PROTEIN: 5.3 g/dL — AB (ref 6.5–8.1)

## 2016-12-07 LAB — CBC
HEMATOCRIT: 29.6 % — AB (ref 39.0–52.0)
Hemoglobin: 8.9 g/dL — ABNORMAL LOW (ref 13.0–17.0)
MCH: 23.5 pg — ABNORMAL LOW (ref 26.0–34.0)
MCHC: 30.1 g/dL (ref 30.0–36.0)
MCV: 78.1 fL (ref 78.0–100.0)
PLATELETS: 281 10*3/uL (ref 150–400)
RBC: 3.79 MIL/uL — ABNORMAL LOW (ref 4.22–5.81)
RDW: 17.3 % — AB (ref 11.5–15.5)
WBC: 11.2 10*3/uL — AB (ref 4.0–10.5)

## 2016-12-07 MED ORDER — POTASSIUM CHLORIDE 10 MEQ/100ML IV SOLN
10.0000 meq | INTRAVENOUS | Status: AC
  Administered 2016-12-07 (×2): 10 meq via INTRAVENOUS
  Filled 2016-12-07 (×2): qty 100

## 2016-12-07 MED ORDER — POTASSIUM CHLORIDE CRYS ER 20 MEQ PO TBCR
40.0000 meq | EXTENDED_RELEASE_TABLET | Freq: Three times a day (TID) | ORAL | Status: AC
  Administered 2016-12-07 (×3): 40 meq via ORAL
  Filled 2016-12-07 (×3): qty 2

## 2016-12-07 MED ORDER — POTASSIUM CHLORIDE CRYS ER 20 MEQ PO TBCR
40.0000 meq | EXTENDED_RELEASE_TABLET | Freq: Two times a day (BID) | ORAL | Status: DC
  Administered 2016-12-08 – 2016-12-11 (×7): 40 meq via ORAL
  Filled 2016-12-07 (×7): qty 2

## 2016-12-07 NOTE — Progress Notes (Signed)
TEAM 1 - Stepdown/ICU TEAM  [name]  KKX:[id number] DOB: 11-Mar-1941 DOA: 12/03/2016 PCP: System, Provider Not In    Brief Narrative:  76 y.o. male who is bedbound and resides in a SNF. 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  It has been recommended that he have an AKA but he has been reluctant. Per ID notes from 12/03/16, patient now agreeable to AKA and is pending re-evaluation by Dr. Donzetta Matters.  He was brought to [facility] ED 12/03/16 with AMS and hypotension. BP in ED ranging anywhere from systolic of 60 to 16R, lactate normal. Urine suggestive of UTI. Per RN, when Foley was placed, urine was very thick and had purulent material within it.  He received 3.5 L IV fluids but remained hypotensive.   Significant Events: 9/12 admit w/ septic shock  9/13 CVL placed   Subjective: Pt reports that he feels better today.  He denies anxiety at present.  He is not SOB, and denies CP, N/V, or abdom pain.    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 Donzetta Matters) to see on Monday to consider R AKA  HTN Stable at present    HLD Cont atorvastatin  CAD Cont ASA and Coreg   Chronic PE on chronic Xarelto - heparin gtt/lovenox being utilized while acutely ill and pending AKA     Chronic hypoxic respiratory failure  Stabilizing - cont to diurese   Acute on CKD ?chronic hydro per CT > improved on CT 9/12 compared to prior imaging - renal fxn has essentially normalized w/ volume expansion - follow w/ resumption of diuretic   Recent Labs Lab 12/03/16 2253 12/04/16 0546 12/05/16 1014 12/06/16 0531 12/07/16 0616  CREATININE 1.38* 1.52* 1.07 0.84 0.81   Hyponatremia - presumably hypovolemic Corrected with volume resuscitation  Hypokalemia Due to poor intake + diuretic - replace and follow   GERD PPI  DM CBG control improving - cont to follow trend   Acute encephalopathy presumably due to sepsis - unclear of baseline mental status - appears to be resolved   Obesity - Body mass index is 33.88 kg/m.    DVT prophylaxis: lovenox  Code Status: DNR - NO CODE BLUE (pressors and BIPAP ok) Family Communication: no family present at time of exam  Disposition Plan: SDU - eventual return to SNF   Consultants:  PCCM Vasc Surgery   Antimicrobials:  Vanc 9/12  Zosyn 9/12 Merem 9/12 >  Linezolid 9/13 >  Objective: Blood pressure (!) 133/54, pulse 75, temperature 97.7 F (36.5 C), temperature source Oral, resp. rate 17, height 5\' 11"  (1.803 m), weight 110.2 kg (242 lb 14.4 oz), SpO2 97 %.  Intake/Output Summary (Last 24 hours)  at 12/07/16 1031 Last data filed at 12/07/16 0900  Gross per 24 hour  Intake           886.33 ml  Output             4075 ml  Net         -3188.67 ml   Filed Weights   12/05/16 1215 12/06/16 0600 12/07/16 0322  Weight: 104.3 kg (229 lb 15 oz) 109.4 kg (241 lb 1.6 oz) 110.2 kg (242 lb 14.4 oz)    Examination: General: No acute respiratory distress - alert and calm Lungs: CTA B without wheezes or crackles - distant BS th/o  Cardiovascular: Regular rate and rhythm w/o M or ru b Abdomen: Nontender, obese, soft, bowel sounds positive, no rebound Extremities: trace edema bilateral lower extremities  CBC:  Recent  Labs Lab 12/03/16 2253 12/04/16 0546 12/05/16 0543 12/06/16 0531 12/07/16 0616  WBC 15.0* 20.9* 17.7* 11.6* 11.2*  HGB 12.1* 8.8* 8.3* 8.4* 8.9*  HCT 38.2* 28.9* 27.1* 27.6* 29.6*  MCV 75.6* 76.7* 77.2* 77.3* 78.1  PLT 138* 226 245 274 970   Basic Metabolic Panel:  Recent Labs Lab 12/03/16 2253 12/04/16 0546 12/05/16 1014 12/06/16 0531 12/07/16 0616  NA 132* 133* 138 137 142  K 3.2* 3.6 3.4* 3.6 2.9*  CL 101 100* 108 109 111  CO2 22 24 22 22 26   GLUCOSE 125* 151* 172* 222* 150*  BUN 30* 32* 26* 21* 16  CREATININE 1.38* 1.52* 1.07 0.84 0.81  CALCIUM 6.4* 7.7* 7.6* 7.7* 7.8*  MG  --  1.8  --   --   --   PHOS  --  4.9*  --   --   --    GFR: Estimated Creatinine Clearance: 96.5 mL/min (by C-G formula based on SCr of 0.81 mg/dL).  Liver Function Tests:  Recent Labs Lab 12/03/16 2253 12/07/16 0616  AST 25 22  ALT 24 24  ALKPHOS 97 75  BILITOT 0.5 0.6  PROT 4.8* 5.3*  ALBUMIN 1.6* 1.8*    Coagulation Profile:  Recent Labs Lab 12/04/16 0546  INR 2.15    HbA1C: Hemoglobin A1C  Date/Time Value Ref Range Status  04/25/2016 6.2  Final   Hgb A1c MFr Bld  Date/Time Value Ref Range Status  11/01/2015 11:34 AM 6.4 (H) 4.8 - 5.6 % Final    Comment:    (NOTE)         Pre-diabetes: 5.7 - 6.4         Diabetes: >6.4         Glycemic control for adults with diabetes: <7.0   05/10/2014 05:25 AM 7.4 (H) 4.8 - 5.6 % Final    Comment:    (NOTE)         Pre-diabetes: 5.7 - 6.4         Diabetes: >6.4         Glycemic control for adults with diabetes: <7.0     CBG:  Recent Labs Lab 12/06/16 0827 12/06/16 1154 12/06/16 1641 12/06/16 2107 12/07/16 0829  GLUCAP 231* 219* 151* 145* 151*    Recent Results (from the past 240 hour(s))  Blood Culture (routine x 2)     Status: None (Preliminary result)   Collection Time: 12/03/16 10:52 PM  Result Value Ref Range Status   Specimen Description BLOOD LEFT FOREARM  Final   Special Requests   Final    BOTTLES  DRAWN AEROBIC ONLY Blood Culture adequate volume   Culture NO GROWTH  3 DAYS  Final   Report Status PENDING  Incomplete  Blood Culture (routine x 2)     Status: None (Preliminary result)   Collection Time: 12/03/16 11:40 PM  Result Value Ref Range Status   Specimen Description BLOOD RIGHT ANTECUBITAL  Final   Special Requests   Final    BOTTLES DRAWN AEROBIC AND ANAEROBIC Blood Culture adequate volume   Culture NO GROWTH 3 DAYS  Final   Report Status PENDING  Incomplete  MRSA PCR Screening     Status: None   Collection Time: 12/04/16  4:11 AM  Result Value Ref Range Status   MRSA by PCR NEGATIVE NEGATIVE Final    Comment:        The GeneXpert MRSA Assay (FDA approved for NASAL specimens only), is one component of a comprehensive MRSA colonization surveillance program. It is not intended to diagnose MRSA infection nor to guide or monitor treatment for MRSA infections.      Scheduled Meds: . aspirin  81 mg Oral Daily  . atorvastatin  20 mg Oral q1800  . carvedilol  3.125 mg Oral BID WC  . docusate sodium  100 mg Oral BID  . DULoxetine  60 mg Oral Q breakfast  . enoxaparin (LOVENOX) injection  110 mg Subcutaneous Q12H  . furosemide  80 mg Oral BID  . gabapentin  600 mg Oral TID  . insulin aspart  0-20 Units Subcutaneous TID WC  . insulin aspart  0-5 Units Subcutaneous QHS  . insulin NPH Human  10 Units Subcutaneous BID AC & HS  . lubiprostone  8 mcg Oral BID WC  . pantoprazole  40 mg Oral Q1200  . polyvinyl alcohol  1 drop Both Eyes BID  . potassium chloride  40 mEq Oral TID  . [START ON 12/08/2016] potassium chloride  40 mEq Oral BID  . senna  17.2 mg Oral BID  . sodium chloride flush  10-40 mL Intracatheter Q12H  . tamsulosin  0.4 mg Oral QPC supper  . traZODone  50 mg Oral QHS     LOS: 3 days   Cherene Altes, MD [facility] Office  [phone] Pager - Text Page per Amion as per below:  On-Call/Text Page:      [email or web address]      password [email or web address]  If  7PM-7AM, please contact night-coverage [email or web address] Password [email or web address] 12/07/2016, 10:31 AM

## 2016-12-08 LAB — BASIC METABOLIC PANEL
ANION GAP: 9 (ref 5–15)
BUN: 11 mg/dL (ref 6–20)
CO2: 28 mmol/L (ref 22–32)
CREATININE: 0.68 mg/dL (ref 0.61–1.24)
Calcium: 7.6 mg/dL — ABNORMAL LOW (ref 8.9–10.3)
Chloride: 104 mmol/L (ref 101–111)
GFR calc non Af Amer: >60 mL/min (ref >60)
GLUCOSE: 125 mg/dL — AB (ref 65–99)
Potassium: 3.3 mmol/L — ABNORMAL LOW (ref 3.5–5.1)
Sodium: 141 mmol/L (ref 135–145)

## 2016-12-08 LAB — GLUCOSE, CAPILLARY
GLUCOSE-CAPILLARY: 176 mg/dL — AB (ref 65–99)
Glucose-Capillary: 117 mg/dL — ABNORMAL HIGH (ref 65–99)
Glucose-Capillary: 117 mg/dL — ABNORMAL HIGH (ref 65–99)
Glucose-Capillary: 112 mg/dL — ABNORMAL HIGH (ref 65–99)

## 2016-12-08 LAB — MAGNESIUM: MAGNESIUM: 1.5 mg/dL — AB (ref 1.7–2.4)

## 2016-12-08 MED ORDER — MAGNESIUM SULFATE 4 GM/100ML IV SOLN
4.0000 g | Freq: Once | INTRAVENOUS | Status: AC
  Administered 2016-12-08: 4 g via INTRAVENOUS
  Filled 2016-12-08: qty 100

## 2016-12-08 MED ORDER — ENOXAPARIN SODIUM 120 MG/0.8ML ~~LOC~~ SOLN
110.0000 mg | Freq: Two times a day (BID) | SUBCUTANEOUS | Status: DC
  Administered 2016-12-08 – 2016-12-09 (×2): 110 mg via SUBCUTANEOUS
  Filled 2016-12-08 (×2): qty 0.73

## 2016-12-08 NOTE — NC FL2 (Signed)
MEDICAID FL2 LEVEL OF CARE SCREENING TOOL     IDENTIFICATION  Patient Name: [name] Birthdate: 1940/10/10 Sex: male Admission Date (Current Location): 12/03/2016  [facility] and [place] Number:  Herbalist and Address:  [facility] [place], [place] [address], [place], [place] [address]      Provider Number: [id number]  Attending Physician Name and Address:  Cherene Altes, MD  Relative Name and Phone Number:       Current Level of Care: Hospital Recommended Level of Care: [place] Prior Approval Number:    Date Approved/Denied:   PASRR Number: [id number]  Discharge Plan: SNF    Current Diagnoses: Patient Active Problem List   Diagnosis Date Noted  . Sepsis ([place]) 12/04/2016  . Acute on chronic renal failure ([place]) 12/04/2016  . Hypothermia   . Encounter for central line placement   . UTI (urinary tract infection) 12/03/2016  . Acute osteomyelitis of calcaneum, right ([place]) 09/30/2016  . Facial rash 09/30/2016  . S/P AKA (above knee amputation) unilateral, left ([place]) 04/29/2016  . Cellulitis 04/29/2016  . 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 Dyslipidemia associated with type 2 diabetes mellitus ([place]) 02/15/2016  . Sepsis affecting skin ([place])   . Cellulitis of right leg 01/24/2016  . Panlobular emphysema ([place])   . Gangrene ([place])   . Septic shock ([place]) 10/31/2015  . Hypokalemia   . Microcytic anemia 10/19/2015  . Atherosclerosis of native arteries of the extremities with ulceration ([place]) 08/03/2015  . Chronic pulmonary embolism ([place]) 06/18/2015  . Diabetic peripheral neuropathy associated with type 2 diabetes mellitus ([place]) 06/18/2015  . Allergic rhinitis 05/22/2015  . Constipation due to opioid therapy 05/06/2015  . Vitamin D deficiency 05/03/2015  . OA (osteoarthritis) 05/12/2014  . 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 GERD (gastroesophageal reflux disease) 11/10/2011  . Peripheral vascular disease ([place]) 04/16/2011  . Chronic obstructive pulmonary disease ([place]) 02/27/2011  . BPH (benign prostatic hyperplasia) 11/01/2010  . Long term current use of anticoagulant therapy 05/03/2010  . Bilateral shoulder pain 11/19/2009  . Depression 07/08/2006  . Essential hypertension 07/08/2006  . Anxiety state 05/21/2006  . Coronary atherosclerosis 05/21/2006  . Chronic systolic CHF (congestive heart failure) ([place]) 05/21/2006    Orientation RESPIRATION BLADDER Height & Weight     Self, Time, Situation, Place  O2 (Nasal Canula 3 L) Continent, Indwelling catheter Weight: 232 lb 12.8 oz (105.6 kg) Height:  5\' 11"  (180.3 cm)  BEHAVIORAL SYMPTOMS/MOOD NEUROLOGICAL BOWEL NUTRITION STATUS  Other (Comment) (Anxious, Cooperative)  (None) Incontinent Diet (Carb modified)  AMBULATORY STATUS COMMUNICATION OF NEEDS Skin     Verbally Bruising, Other (Comment) (Amputation, Excoriated, MASD.)                       Personal Care Assistance Level of Assistance              Functional Limitations Info  Sight, Hearing, Speech Sight Info: Adequate Hearing Info: Adequate Speech Info: Adequate    SPECIAL CARE FACTORS FREQUENCY  Blood pressure                    Contractures Contractures Info: Not present    Additional Factors Info  Code Status, Allergies, Psychotropic Code Status Info: Partial: Use NIPPV/BiPAp only if indicated  Allergies Info: Diazepam, Vancomycin Psychotropic Info: Anxiety, Depression: Cymbalta DR 60 mg PO daily  with breakfast, Trazodone 50 mg PO QHS, Ativan 0.5 mg PO every 4 hours prn.         Current Medications (12/08/2016):  This is the current hospital active medication list Current Facility-Administered Medications  Medication Dose Route Frequency Provider Last Rate Last Dose  . aspirin chewable tablet 81 mg  81 mg Oral Daily Desai, Rahul P, PA-C   81 mg at 12/07/16 1055  .  atorvastatin (LIPITOR) tablet 20 mg  20 mg Oral q1800 Desai, Rahul P, PA-C   20 mg at 12/07/16 1833  . carvedilol (COREG) tablet 3.125 mg  3.125 mg Oral BID WC Cherene Altes, MD   3.125 mg at 12/08/16 0647  . docusate sodium (COLACE) capsule 100 mg  100 mg Oral BID Cherene Altes, MD   100 mg at 12/07/16 2239  . DULoxetine (CYMBALTA) DR capsule 60 mg  60 mg Oral Q breakfast Cherene Altes, MD   60 mg at 12/08/16 [phone]  . enoxaparin (LOVENOX) injection 110 mg  110 mg Subcutaneous Q12H Carney, Gay Filler, RPH   110 mg at 12/07/16 2321  . furosemide (LASIX) tablet 80 mg  80 mg Oral BID Cherene Altes, MD   80 mg at 12/08/16 0647  . gabapentin (NEURONTIN) tablet 600 mg  600 mg Oral TID Cherene Altes, MD   600 mg at 12/07/16 2239  . HYDROcodone-acetaminophen (NORCO/VICODIN) 5-325 MG per tablet 1 tablet  1 tablet Oral Q6H PRN Rigoberto Noel, MD   1 tablet at 12/07/16 1425  . hydrocortisone-pramoxine (PROCTOFOAM-HC) rectal foam 1 applicator  1 applicator Rectal V78I PRN Cherene Altes, MD      . insulin aspart (novoLOG) injection 0-20 Units  0-20 Units Subcutaneous TID WC Cherene Altes, MD   3 Units at 12/07/16 1235  . insulin aspart (novoLOG) injection 0-5 Units  0-5 Units Subcutaneous QHS Joette Catching T, MD      . insulin NPH Human (HUMULIN N,NOVOLIN N) injection 10 Units  10 Units Subcutaneous BID AC & HS Cherene Altes, MD   10 Units at 12/08/16 [phone]  . linezolid (ZYVOX) IVPB 600 mg  600 mg Intravenous Q12H Marshell Garfinkel, MD   Stopped at 12/08/16 0034  . LORazepam (ATIVAN) tablet 0.5 mg  0.5 mg Oral Q4H PRN Cherene Altes, MD   0.5 mg at 12/06/16 2132  . lubiprostone (AMITIZA) capsule 8 mcg  8 mcg Oral BID WC Cherene Altes, MD   8 mcg at 12/08/16 [phone]  . magnesium sulfate IVPB 4 g 100 mL  4 g Intravenous Once Joette Catching T, MD      . meropenem [facility]) 1 g in sodium chloride 0.9 % 100 mL IVPB  1 g Intravenous Q8H Laren Everts, RPH   Stopped at  12/08/16 0645  . oxyCODONE (Oxy IR/ROXICODONE) immediate release tablet 5 mg  5 mg Oral Q8H PRN Cherene Altes, MD      . pantoprazole (PROTONIX) EC tablet 40 mg  40 mg Oral Q1200 Cherene Altes, MD   40 mg at 12/07/16 1235  . polyvinyl alcohol (LIQUIFILM TEARS) 1.4 % ophthalmic solution 1 drop  1 drop Both Eyes BID Cherene Altes, MD   1 drop at 12/07/16 2240  . potassium chloride SA (K-DUR,KLOR-CON) CR tablet 40 mEq  40 mEq Oral BID Cherene Altes, MD      . senna (SENOKOT) tablet 17.2 mg  17.2 mg Oral BID Cherene Altes, MD  17.2 mg at 12/07/16 2239  . sodium chloride flush (NS) 0.9 % injection 10-40 mL  10-40 mL Intracatheter Q12H Cherene Altes, MD   10 mL at 12/07/16 2242  . sodium chloride flush (NS) 0.9 % injection 10-40 mL  10-40 mL Intracatheter PRN Cherene Altes, MD      . tamsulosin (FLOMAX) capsule 0.4 mg  0.4 mg Oral QPC supper Cherene Altes, MD   0.4 mg at 12/07/16 1832  . traZODone (DESYREL) tablet 50 mg  50 mg Oral QHS Cherene Altes, MD   50 mg at 12/07/16 2240     Discharge Medications: Please see discharge summary for a list of discharge medications.  Relevant Imaging Results:  Relevant Lab Results:   Additional Information SS#: [id number]  Candie Chroman, LCSW

## 2016-12-08 NOTE — Consult Note (Signed)
Hospital Consult    Reason for Consult:  Right heel osteomyelitis Referring Physician:  Thereasa Solo MRN #:  [id number]  History of Present Illness: This is a 76 y.o. 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 Now admitted with concern for sepsis possible from UTI. Has been reluctant to undergo amputation on the right in the past given his overall pain control and previous wound healing issues with left aka. No new complaints today other than general overall discomfort.   Past Medical History:  Diagnosis Date  . Acute osteomyelitis of calcaneum, right ([place]) 09/30/2016  . Adhesive capsulitis   . Angina   . Anxiety   . CAD (coronary artery disease)   . CHF (congestive heart failure) ([place])   . Chronic back pain   . Chronic kidney disease    hx of BPH  . COPD (chronic obstructive pulmonary disease) ([place])   . CVA (cerebral infarction) Questionable history  . Depression   . Diabetes mellitus   . Diverticulosis   . Facial rash 09/30/2016  . Falls frequently 06/2014  . GERD (gastroesophageal reflux disease)   . Hyperlipidemia   . Hypertension   . 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 Myocardial infarction ([place])   . Neuromuscular disorder ([place])    HX of diabetic periferal neuropathy  . On home oxygen therapy    "1.5L prn" (04/12/2014)  . Osteomyelitis ([place]) 2012   Sternoclavicular joint   . PE (pulmonary embolism) 10-15 years ago   Lifelong Coumadin  . Peripheral vascular disease ([place])   . Shortness of breath   . Sleep apnea   . UTI (urinary tract infection) 12/03/2016    Past Surgical History:  Procedure Laterality Date  . AMPUTATION  03/16/2011   Procedure: AMPUTATION DIGIT;  Surgeon: Angelia Mould, MD;  Location: [facility] OR;  Service: Vascular;  Laterality: Left;  Great toe  . AMPUTATION Left 11/08/2015   Procedure: AMPUTATION ABOVE KNEE LEFT;  Surgeon: Waynetta Sandy, MD;  Location: [facility];  Service: Vascular;  Laterality: Left;  . CARDIAC CATHETERIZATION  October 18, 2010   Known obstructive disease, no further blockages  . CARDIAC CATHETERIZATION  03/17/2005   EF 35-40%  . COLONOSCOPY  2006    Multiple polyps removed, repeat in 5 years  . CORONARY ARTERY BYPASS GRAFT  1992  . [place], 2002  . DOPPLER ECHOCARDIOGRAPHY  Sept 2011   EF 50-55% with some impaired diastolic relaxation  . FEMORAL-POPLITEAL BYPASS GRAFT  2008   Left  . FRACTURE SURGERY  1980s   Hip  . HIP ARTHROPLASTY Left   . PERIPHERAL VASCULAR CATHETERIZATION N/A 11/06/2015   Procedure: Abdominal Aortogram;  Surgeon: Serafina Mitchell, MD;  Location: [place] [facility];  Service: Cardiovascular;  Laterality: N/A;  . PERIPHERAL VASCULAR CATHETERIZATION Bilateral 11/06/2015   Procedure: Lower Extremity Angiography;  Surgeon: Serafina Mitchell, MD;  Location: [place];  Service: Cardiovascular;  Laterality: Bilateral;  . Right iliopopliteal bypass - [place]  . septic arthritis     Removal of infected CABG wire by Dr Arlyce Dice - 2011  . SPINE SURGERY  2002   Cervical fusion vertebroplasty C3-4-5  . US ECHOCARDIOGRAPHY  03/04/2006   EF 50-55%    Allergies  Allergen Reactions  . Diazepam Anxiety    Induces crying  . Vancomycin Other (See Comments)    "  red man syndrome"    Prior to Admission medications   Medication Sig Start Date End Date Taking? Authorizing Provider  acetaminophen (TYLENOL) 325 MG tablet Take 650 mg by mouth every 6 (six) hours as needed for fever.   Yes [provider]  Amino Acids-Protein Hydrolys (FEEDING SUPPLEMENT, PRO-STAT SUGAR FREE 64,) LIQD Take 30 mLs by mouth 2 (two) times daily. 11/10/15  Yes Rai, Ripudeep K, MD  aspirin EC 81 MG EC tablet Take 1 tablet (81 mg total) by mouth daily. 11/10/15  Yes Rai, Ripudeep K, MD  atorvastatin (LIPITOR) 20 MG tablet Take 20 mg by mouth at bedtime.    Yes [provider]    carvedilol (COREG) 3.125 MG tablet Take 3.125 mg by mouth 2 (two) times daily with a meal.    Yes [provider]  cetirizine (ZYRTEC ALLERGY) 10 MG tablet Take 10 mg by mouth daily with breakfast.    Yes [provider]  diclofenac sodium (VOLTAREN) 1 % GEL Apply 2 g topically every 8 (eight) hours.    Yes [provider]  docusate sodium (COLACE) 100 MG capsule Take 100 mg by mouth 2 (two) times daily.   Yes [provider]  doxycycline (VIBRA-TABS) 100 MG tablet Take 100 mg by mouth daily.   Yes [provider]  DULoxetine (CYMBALTA) 30 MG capsule Take 90 mg by mouth daily with breakfast.    Yes [provider]  ferrous sulfate 325 (65 FE) MG tablet Take 1 tablet (325 mg total) by mouth daily with breakfast. 10/29/15  Yes Tat, Shanon Brow, MD  furosemide (LASIX) 80 MG tablet Take 1 tablet (80 mg total) by mouth 2 (two) times daily. 10/29/15  Yes Tat, Shanon Brow, MD  gabapentin (NEURONTIN) 600 MG tablet Take 600 mg by mouth 3 (three) times daily.    Yes [provider]  gentamicin cream (GARAMYCIN) 0.1 % Apply 1 application topically 2 (two) times daily. APPLY TO FACE 11/06/16  Yes [provider]  HUMULIN N KWIKPEN 100 UNIT/ML Kiwkpen Inject 20 Units into the skin 2 (two) times daily before a meal. 11/28/16  Yes [provider]  HYDROcodone-acetaminophen (NORCO/VICODIN) 5-325 MG tablet Take 1 tablet by mouth every 6 (six) hours as needed (for pain).    Yes [provider]  hydrocortisone-pramoxine (PROCTOFOAM-HC) rectal foam Place 1 applicator rectally every 12 (twelve) hours as needed for hemorrhoids.   Yes [provider]  insulin lispro (HUMALOG KWIKPEN) 100 UNIT/ML KiwkPen Inject 0-13 Units into the skin 3 (three) times daily before meals. PER SLIDING SCALE: BGL  0-70=0 units; 71-149 = 8 units, 150-600= 13 units   Yes [provider]  methocarbamol (ROBAXIN) 500 MG tablet Take 500 mg by mouth at bedtime.    Yes [provider]  Multiple Vitamins-Minerals (DECUBI-VITE PO) Take 1 capsule by mouth daily with breakfast.    Yes [provider]  mupirocin ointment (BACTROBAN) 2 % Place 1 application into the nose 2 (two) times daily. Patient taking differently: Place 1 application into the nose 2 (two) times daily. FOR FACIAL RASH 09/30/16  Yes Tommy Medal, Lavell Islam, MD  nitroGLYCERIN (NITROSTAT) 0.4 MG SL tablet Place 1 tablet (0.4 mg total) under the tongue as needed. For chest pains Patient taking differently: Place 0.4 mg under the tongue every 5 (five) minutes x 3 doses as needed for chest pain.  10/19/13  Yes Alveda Reasons, MD  omeprazole (PRILOSEC) 40 MG capsule Take 1 capsule (40 mg total) by  mouth daily. 05/10/14  Yes Alveda Reasons, MD  oxyCODONE (OXY IR/ROXICODONE) 5 MG immediate release tablet Take 5 mg by mouth every 8 (eight) hours as needed for severe pain.   Yes [provider]  OXYGEN Inhale 2 L into the lungs continuous.    Yes [provider]  polyethylene glycol (MIRALAX / GLYCOLAX) packet Take 17 g by mouth 4 (four) times daily.    Yes [provider]  potassium chloride SA (K-DUR,KLOR-CON) 20 MEQ tablet Take 20 mEq by mouth daily with breakfast.    Yes [provider]  Propylene Glycol (SYSTANE BALANCE) 0.6 % SOLN Place 1 drop into both eyes 2 (two) times daily.    Yes [provider]  rivaroxaban (XARELTO) 20 MG TABS tablet Take 20 mg by mouth daily with supper.   Yes [provider]  senna (SENOKOT) 8.6 MG TABS tablet Take 17.2 mg by mouth 2 (two) times daily.   Yes [provider]  tamsulosin (FLOMAX) 0.4 MG CAPS capsule Take 1 capsule (0.4 mg total) by mouth daily after supper. 10/19/13  Yes Alveda Reasons, MD  traZODone (DESYREL) 100 MG tablet Take 100 mg by mouth at bedtime.    Yes [provider]  Vitamin D, Ergocalciferol, (DRISDOL) 50000 units CAPS capsule Take 1 capsule (50,000 Units  total) by mouth every 7 (seven) days. Patient taking differently: Take 50,000 Units by mouth every Thursday.  05/03/15  Yes Gerlene Fee, NP  Vitamins A & D (VITAMIN A & D) ointment Apply 1 application topically every 12 (twelve) hours as needed for dry skin.    Yes [provider]  bisacodyl (DULCOLAX) 10 MG suppository Place 1 suppository (10 mg total) rectally daily as needed for moderate constipation. Patient not taking: Reported on 12/04/2016 11/10/15   Rai, Vernelle Emerald, MD  guaiFENesin-dextromethorphan (ROBITUSSIN DM) 100-10 MG/5ML syrup Take 15 mLs by mouth every 4 (four) hours as needed for cough. Patient not taking: Reported on 12/04/2016 11/10/15   Rai, Vernelle Emerald, MD  lubiprostone (AMITIZA) 8 MCG capsule Take 8 mcg by mouth 2 (two) times daily with a meal.    [provider]  senna-docusate (SENOKOT-S) 8.6-50 MG tablet Take 1 tablet by mouth 2 (two) times daily. Patient not taking: Reported on 12/04/2016 11/10/15   Rai, Vernelle Emerald, MD  traMADol (ULTRAM) 50 MG tablet Take 1 tablet (50 mg total) by mouth 2 (two) times daily. Patient not taking: Reported on 12/04/2016 08/01/16   Gayland Curry, DO    Social History   Social History  . Marital status: Married    Spouse name: Butch Penny  . Number of children: 3  . Years of education: 12   Occupational History  . Retired-machinest/welder Retired   Social History Main Topics  . Smoking status: Former Smoker    Packs/day: 1.00    Quit date: 06/19/2000  . Smokeless tobacco: Never Used  . Alcohol use No  . Drug use: No  . Sexual activity: No   Other Topics Concern  . Not on file   Social History Narrative   Health Care POA: TBD   Emergency Contact: wife, Butch Penny [phone]   End of Life Plan: pt does not have AD and not interested.   Who lives with you: Butch Penny and step-daughter-Betty   Any pets: none   Diet: Pt has a varied diet of protein, starch and vegetables.   Exercise: Pt does not have regular exercise routine.    Seatbelts: Pt reports  wearing seatbelt when in vehicles.    Hobbies: listening to car races.               Family History  Problem Relation Age of Onset  . Heart disease Father   . Heart disease Mother     REVIEW OF SYSTEMS (negative unless checked):   Cardiac:  []  Chest pain or chest pressure? []  Shortness of breath upon activity? []  Shortness of breath when lying flat? []  Irregular heart rhythm?  Vascular:  []  Pain in calf, thigh, or hip brought on by walking? []  Pain in feet at night that wakes you up from your sleep? []  Blood clot in your veins? []  Leg swelling?  Pulmonary:  []  Oxygen at home? []  Productive cough? []  Wheezing?  Neurologic:  []  Sudden weakness in arms or legs? []  Sudden numbness in arms or legs? []  Sudden onset of difficult speaking or slurred speech? []  Temporary loss of vision in one eye? []  Problems with dizziness?  Gastrointestinal:  []  Blood in stool? []  Vomited blood?  Genitourinary:  []  Burning when urinating? []  Blood in urine?  Psychiatric:  []  Major depression  Hematologic:  []  Bleeding problems? []  Problems with blood clotting?  Dermatologic:  [x]  Rashes or ulcers?  Constitutional:  []  Fever or chills?  Ear/Nose/Throat:  []  Change in hearing? []  Nose bleeds? []  Sore throat?  Musculoskeletal:  []  Back pain? [x]  Joint pain? [x]  Muscle pain?     Physical Examination  Vitals:   12/08/16 0800 12/08/16 1206  BP: (!) 96/54 (!) 92/54  Pulse: 75 (!) 101  Resp: 19 18  Temp:  98.2 F (36.8 C)  SpO2: 94% 92%   Body mass index is 32.47 kg/m.  General:  WDWN in NAD Gait: Not observed HENT: WNL, normocephalic Pulmonary: normal non-labored breathing Cardiac:palpable femoral pulses bilaterally Abdomen: soft, NT/ND, no masses Extremities: well healed left aka Right lateral heel with subcentimeter ulceration, no erythema Musculoskeletal: no muscle wasting or atrophy  Neurologic: A&O X 3; Appropriate Affect ;  SENSATION: normal; MOTOR FUNCTION:  moving all extremities equally. Speech is fluent/normal   CBC    Component Value Date/Time   WBC 11.2 (H) 12/07/2016 0616   RBC 3.79 (L) 12/07/2016 0616   HGB 8.9 (L) 12/07/2016 0616   HCT 29.6 (L) 12/07/2016 0616   PLT 281 12/07/2016 0616   MCV 78.1 12/07/2016 0616   MCH 23.5 (L) 12/07/2016 0616   MCHC 30.1 12/07/2016 0616   RDW 17.3 (H) 12/07/2016 0616   LYMPHSABS 1.7 04/29/2016 1438   MONOABS 2.0 (H) 04/29/2016 1438   EOSABS 0.2 04/29/2016 1438   BASOSABS 0.0 04/29/2016 1438    BMET    Component Value Date/Time   NA 141 12/08/2016 0610   NA 136 (A) 07/26/2015   K 3.3 (L) 12/08/2016 0610   K 3.5 01/04/2016   CL 104 12/08/2016 0610   CO2 28 12/08/2016 0610   GLUCOSE 125 (H) 12/08/2016 0610   BUN 11 12/08/2016 0610   BUN 37 (A) 07/26/2015   CREATININE 0.68 12/08/2016 0610   CREATININE 1.11 06/14/2013 1221   CALCIUM 7.6 (L) 12/08/2016 0610   GFRNONAA >60 12/08/2016 0610   GFRAA >60 12/08/2016 0610    COAGS: Lab Results  Component Value Date   INR 2.15 12/04/2016   INR 1.36 04/29/2016   INR 1.58 01/24/2016   PROTIME 19.9 (A) 08/13/2015   PROTIME 35.5 (A) 08/01/2015   PROTIME 21.0 (A) 07/09/2015      ASSESSMENT/PLAN: This is  a 76 y.o. male here with concern for sepsis. Lateral heel ulceration in unchanged. I discussed amputation with him today which he continues to be reluctant to proceed given pain and previous wound healing issues. I also discussed case with daughter the other day via phone and I have asked him to reach out to her today. Available for right aka if needed.  Nelsy Madonna C. Donzetta Matters, MD [facility] Office: [phone] Pager: [phone]

## 2016-12-08 NOTE — Evaluation (Signed)
Physical Therapy Evaluation Patient Details Name: [name] MRN: [id number] DOB: 11/20/1940 Today's Date: 12/08/2016   History of Present Illness  [name] is a 76 y.o. 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  Clinical Impression  Pt admitted with above diagnosis. Pt currently with functional limitations due to the deficits listed below (see PT Problem List). Pt was unable to do more than bed level eval.  Pt has been bedbound for some time at SNF.  Do think that pt has potential therefore will see while here in hospital.  Was going to get pt OOB but couldn't as lift not charged.  Nurse aware to lift pt later.  Will follow and see if pt can progres toward goals.   Pt will benefit from skilled PT to increase their independence and safety with mobility to allow discharge to the venue listed below.      Follow Up Recommendations SNF;Supervision/Assistance - 24 hour    Equipment Recommendations  None recommended by PT    Recommendations for Other Services       Precautions / Restrictions Precautions Precautions: Fall Restrictions Weight Bearing Restrictions: No      Mobility  Bed Mobility Overal bed mobility: Needs Assistance Bed Mobility: Rolling Rolling: Max assist;+2 for physical assistance            Transfers                    Ambulation/Gait                Stairs            Wheelchair Mobility    Modified Rankin (Stroke Patients Only)       Balance                                             Pertinent Vitals/Pain Pain Assessment: Faces Faces Pain Scale: Hurts even more Pain Location: bottom (we checked and it is irritated, applied cream, pt was turned on his right side when we entered) Pain Descriptors / Indicators: Grimacing;Moaning Pain  Intervention(s): Limited activity within patient's tolerance;Monitored during session;Repositioned    Home Living Family/patient expects to be discharged to:: Skilled nursing facility                 Additional Comments: Pt is from SNF where he has resided for the past 2 years.    Prior Function Level of Independence: Needs assistance   Gait / Transfers Assistance Needed: uses lift for OOB; reports he cannot sit unsupported due to wobbly  ADL's / Homemaking Assistance Needed: Dependent with ADLs; but can feed himself  Comments: reports he got up to chair when going to appts.     Hand Dominance   Dominant Hand: Right (but I mainly use my left alot)    Extremity/Trunk Assessment   Upper Extremity Assessment Upper Extremity Assessment: Defer to OT evaluation    Lower Extremity Assessment Lower Extremity Assessment: RLE deficits/detail;LLE deficits/detail RLE Deficits / Details: ankle 2-/5, knee 2-/5, hip 1/5; knee flexion contracture as well  LLE Deficits / Details: NT as pt refused       Communication   Communication: HOH  Cognition  Arousal/Alertness: Awake/alert Behavior During Therapy: WFL for tasks assessed/performed Overall Cognitive Status: Within Functional Limits for tasks assessed                                        General Comments General comments (skin integrity, edema, etc.): Pt c/o pain in buttocks.  Rolled pt to see if he had breakdown, however pt just red at present and applied barrier cream.  Moved pt bed to the chair position at about 60 degrees.  Pt stated he was comfortable on departure.  Pillows under UEs for comfort.  Was going to try and use lift to get pt up but lift not charged.  Placed lift on base and asked nursing to lift pt OOB later today.     Exercises General Exercises - Lower Extremity Ankle Circles/Pumps: AROM;Right;5 reps;Supine Heel Slides: AROM;Right;5 reps;Supine   Assessment/Plan    PT Assessment Patient  needs continued PT services  PT Problem List Decreased activity tolerance;Decreased strength;Decreased range of motion;Decreased balance;Decreased mobility;Decreased knowledge of use of DME;Decreased cognition;Decreased safety awareness;Decreased knowledge of precautions;Pain;Obesity       PT Treatment Interventions DME instruction;Functional mobility training;Therapeutic activities;Therapeutic exercise;Balance training;Patient/family education;Cognitive remediation;Wheelchair mobility training    PT Goals (Current goals can be found in the Care Plan section)  Acute Rehab PT Goals Patient Stated Goal: wanted to get OOB PT Goal Formulation: With patient Time For Goal Achievement: 12/22/16 Potential to Achieve Goals: Good    Frequency Min 2X/week   Barriers to discharge Decreased caregiver support      Co-evaluation PT/OT/SLP Co-Evaluation/Treatment: Yes Reason for Co-Treatment: For patient/therapist safety PT goals addressed during session: Mobility/safety with mobility OT goals addressed during session: Strengthening/ROM       AM-PAC PT "6 Clicks" Daily Activity  Outcome Measure Difficulty turning over in bed (including adjusting bedclothes, sheets and blankets)?: Unable Difficulty moving from lying on back to sitting on the side of the bed? : Unable Difficulty sitting down on and standing up from a chair with arms (e.g., wheelchair, bedside commode, etc,.)?: Unable Help needed moving to and from a bed to chair (including a wheelchair)?: Total Help needed walking in hospital room?: Total Help needed climbing 3-5 steps with a railing? : Total 6 Click Score: 6    End of Session Equipment Utilized During Treatment: Oxygen Activity Tolerance: Patient limited by fatigue Patient left: in bed;with call bell/phone within reach;with bed alarm set (in chair position) Nurse Communication: Need for lift equipment;Mobility status PT Visit Diagnosis: Muscle weakness (generalized)  (M62.81);Other abnormalities of gait and mobility (R26.89);Pain Pain - part of body:  (buttock)    Time: 4403-4742 PT Time Calculation (min) (ACUTE ONLY): 22 min   Charges:   PT Evaluation $PT Eval Moderate Complexity: 1 Mod     PT G Codes:        Aubrionna Istre,PT Acute Rehabilitation [phone] (pager)   Denice Paradise 12/08/2016, 3:53 PM

## 2016-12-08 NOTE — Progress Notes (Addendum)
[name] TEAM 1 - Stepdown/ICU TEAM  [name]  PYP:[id number] DOB: Jan 18, 1941 DOA: 12/03/2016 PCP: System, Provider Not In    Brief Narrative:  76 y.o. male who is bedbound and resides in a SNF. 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  It has been recommended that he have an AKA but he has been reluctant. Per ID notes from 12/03/16, patient now agreeable to AKA and is pending re-evaluation by Dr. Donzetta Matters.  He was brought to [facility] ED 12/03/16 with AMS and hypotension. BP in ED ranging anywhere from systolic of 60 to 24P, lactate normal. Urine suggestive of UTI. Per RN, when Foley was placed, urine was very thick and had purulent material within it.  He received 3.5 L IV fluids but remained hypotensive.   Significant Events: 9/12 admit w/ septic shock  9/13 CVL placed   Subjective: The pt is laying completely flat in bed and says he feels better in general.  He does complain of soreness in his buttocks, but denies shortness of breath fevers chills nausea vomiting or abdominal pain.  He tells me he spoke with the vascular surgeon this morning and that he is ready to go ahead with surgery as suggested.  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 Donzetta Matters) has seen - discussed w/ Dr. Donzetta Matters and he is willing to proceed with surgery if the pt desires, but is not convinced it is in his best interest at this time (high risk of poor healing, low pain tolerance, minimal to no improvement in quality of life) - I agree with Dr. Donzetta Matters as I am not sure we would be helping this pt by putting him through such a procedure - I will ask Palliative Care to see him and to help establish goals of care - perhaps long term suppressive abx, or even simple local care w/ watchful waiting would be in his best interest  HTN BP trending down w/ diuresis - cont diuretic for now and follow trend   HLD Cont atorvastatin  CAD Cont ASA and Coreg   Chronic PE on chronic Xarelto - heparin gtt/lovenox being utilized until timing of AKA defined   Chronic hypoxic respiratory failure  Has been liberated to RA following diuresis   Acute on CKD ?chronic hydro per CT > improved on CT 9/12 compared to prior imaging - renal fxn has normalized w/ volume expansion - cont to follow w/ ongoing diuresis   Recent Labs Lab 12/04/16 0546 12/05/16 1014 12/06/16 0531 12/07/16 8338  12/08/16 0610  CREATININE 1.52* 1.07 0.84 0.81 0.68   Hyponatremia - presumably initially hypovolemic Corrected with volume resuscitation during initial portion of hospital stay   Hypokalemia Due to poor intake + diuretic - replace and follow   Hypomagnesemia Due to diuretic - replace and follow   GERD PPI  DM CBG reasonably controlled - follow w/o change today   Acute encephalopathy presumably due to sepsis - unclear of baseline mental status - appears to be resolved   Obesity - Body mass index is 32.47 kg/m.    DVT prophylaxis: lovenox  Code Status: DNR - NO CODE BLUE (pressors and BIPAP ok) Family Communication: no family present at time of exam    Disposition Plan: transfer to med/surg - begin PT/OT  Consultants:  PCCM Vasc Surgery   Antimicrobials:  Vanc 9/12  Zosyn 9/12 Merem 9/12 >  Linezolid 9/13 >  Objective: Blood pressure (!) 92/54, pulse (!) 101, temperature 98.2 F (36.8 C), temperature source Oral, resp. rate 18, height 5\' 11"  (1.803 m), weight 105.6 kg (232 lb 12.8 oz), SpO2 92 %.  Intake/Output Summary (Last 24 hours) at 12/08/16 1347 Last data filed at 12/08/16 1200  Gross per 24 hour  Intake             1050 ml  Output             5775 ml  Net            -4725 ml   Filed Weights   12/06/16 0600 12/07/16 0322 12/08/16 0600  Weight: 109.4 kg (241 lb 1.6 oz) 110.2 kg (242 lb 14.4 oz) 105.6 kg (232 lb 12.8 oz)    Examination: General: No acute respiratory distress - alert and conversant  Lungs: CTA B - distant BS th/o but w/ no wheeze or crackles  Cardiovascular: RRR w/o rub or M  Abdomen: Nontender, obese, soft, bowel sounds positive, no rebound Extremities: 1+ anasarca  CBC:  Recent Labs Lab 12/03/16 2253 12/04/16 0546 12/05/16 0543 12/06/16 0531 12/07/16 0616  WBC 15.0* 20.9* 17.7* 11.6* 11.2*  HGB 12.1* 8.8* 8.3* 8.4* 8.9*  HCT 38.2* 28.9* 27.1* 27.6* 29.6*  MCV 75.6* 76.7* 77.2* 77.3* 78.1  PLT 138* 226 245 274 914   Basic Metabolic Panel:  Recent Labs Lab 12/04/16 0546 12/05/16 1014 12/06/16 0531 12/07/16 0616 12/08/16 0610  NA 133* 138 137 142 141  K 3.6 3.4* 3.6 2.9* 3.3*  CL 100* 108 109 111 104  CO2 24 22 22 26 28   GLUCOSE 151* 172* 222* 150* 125*  BUN 32* 26* 21* 16 11  CREATININE 1.52* 1.07 0.84 0.81 0.68  CALCIUM 7.7* 7.6* 7.7* 7.8* 7.6*  MG 1.8  --   --   --  1.5*  PHOS 4.9*  --   --   --   --    GFR: Estimated Creatinine Clearance: 95.6 mL/min (by C-G formula based on SCr of 0.68 mg/dL).  Liver Function Tests:  Recent Labs Lab 12/03/16 2253 12/07/16 0616  AST 25 22  ALT 24 24  ALKPHOS 97 75  BILITOT 0.5 0.6  PROT 4.8* 5.3*  ALBUMIN 1.6* 1.8*     Coagulation Profile:  Recent Labs Lab 12/04/16 0546  INR 2.15    HbA1C: Hemoglobin A1C  Date/Time Value Ref Range Status  04/25/2016 6.2  Final   Hgb A1c MFr Bld  Date/Time Value Ref Range Status  11/01/2015 11:34 AM 6.4 (H) 4.8 - 5.6 % Final    Comment:    (  NOTE)         Pre-diabetes: 5.7 - 6.4         Diabetes: >6.4         Glycemic control for adults with diabetes: <7.0   05/10/2014 05:25 AM 7.4 (H) 4.8 - 5.6 % Final    Comment:    (NOTE)         Pre-diabetes: 5.7 - 6.4         Diabetes: >6.4         Glycemic control for adults with diabetes: <7.0     CBG:  Recent Labs Lab 12/07/16 1109 12/07/16 1633 12/07/16 2106 12/08/16 0735 12/08/16 1204  GLUCAP 136* 104* 133* 117* 176*    Recent Results (from the past 240 hour(s))  Blood Culture (routine x 2)     Status: None (Preliminary result)   Collection Time: 12/03/16 10:52 PM  Result Value Ref Range Status   Specimen Description BLOOD LEFT FOREARM  Final   Special Requests   Final    BOTTLES DRAWN AEROBIC ONLY Blood Culture adequate volume   Culture NO GROWTH 3 DAYS  Final   Report Status PENDING  Incomplete  Blood Culture (routine x 2)     Status: None (Preliminary result)   Collection Time: 12/03/16 11:40 PM  Result Value Ref Range Status   Specimen Description BLOOD RIGHT ANTECUBITAL  Final   Special Requests   Final    BOTTLES DRAWN AEROBIC AND ANAEROBIC Blood Culture adequate volume   Culture NO GROWTH 3 DAYS  Final   Report Status PENDING  Incomplete  MRSA PCR Screening     Status: None   Collection Time: 12/04/16  4:11 AM  Result Value Ref Range Status   MRSA by PCR NEGATIVE NEGATIVE Final    Comment:        The GeneXpert MRSA Assay (FDA approved for NASAL specimens only), is one component of a comprehensive MRSA colonization surveillance program. It is not intended to diagnose MRSA infection nor to guide or monitor treatment for MRSA infections.      Scheduled Meds: . aspirin   81 mg Oral Daily  . atorvastatin  20 mg Oral q1800  . carvedilol  3.125 mg Oral BID WC  . docusate sodium  100 mg Oral BID  . DULoxetine  60 mg Oral Q breakfast  . enoxaparin (LOVENOX) injection  110 mg Subcutaneous Q12H  . furosemide  80 mg Oral BID  . gabapentin  600 mg Oral TID  . insulin aspart  0-20 Units Subcutaneous TID WC  . insulin aspart  0-5 Units Subcutaneous QHS  . insulin NPH Human  10 Units Subcutaneous BID AC & HS  . lubiprostone  8 mcg Oral BID WC  . pantoprazole  40 mg Oral Q1200  . polyvinyl alcohol  1 drop Both Eyes BID  . potassium chloride  40 mEq Oral BID  . senna  17.2 mg Oral BID  . sodium chloride flush  10-40 mL Intracatheter Q12H  . tamsulosin  0.4 mg Oral QPC supper  . traZODone  50 mg Oral QHS     LOS: 4 days   Cherene Altes, MD [facility] Office  [phone] Pager - Text Page per Amion as per below:  On-Call/Text Page:      [email or web address]      password [email or web address]  If 7PM-7AM, please contact night-coverage [email or web address] Password [email or web address] 12/08/2016, 1:47 PM

## 2016-12-08 NOTE — Progress Notes (Signed)
Patient transferred to [phone] via bed with belongings.

## 2016-12-08 NOTE — Evaluation (Signed)
Occupational Therapy Evaluation Patient Details Name: [name] MRN: [id number] DOB: 12-Jul-1940 Today's Date: 12/08/2016    History of Present Illness [name] is a 76 y.o. 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   Clinical Impression   This 75 yo male admitted with above presents to acute OT with decreased activity tolerance due to sore bottom (discussed with RN the possible need for air overlay mattress) and generalized deconditioning. He will benefit from acute OT with follow up OT at SNF.    Follow Up Recommendations  SNF;Supervision/Assistance - 24 hour    Equipment Recommendations  None recommended by OT       Precautions / Restrictions Precautions Precautions: Fall Restrictions Weight Bearing Restrictions: No      Mobility Bed Mobility Overal bed mobility: Needs Assistance Bed Mobility: Rolling Rolling: Max assist;+2 for physical assistance                   ADL either performed or assessed with clinical judgement   ADL Overall ADL's : Needs assistance/impaired                                       General ADL Comments: At baseline he is total A for all basic ADLs, except self feeding and some grooming tasks bed level (he can still do those tasks here if good positioning in bed)     Vision Baseline Vision/History: Wears glasses Wears Glasses: Reading only Patient Visual Report: No change from baseline              Pertinent Vitals/Pain Pain Assessment: Faces Faces Pain Scale: Hurts even more Pain Location: bottom (we checked and it is irritated, applied cream, pt was turned on his right side when we entered) Pain Descriptors / Indicators: Grimacing;Moaning Pain Intervention(s): Limited activity within patient's tolerance;Repositioned     Hand Dominance  Right (but I mainly use my left alot)   Extremity/Trunk Assessment Upper Extremity Assessment Upper Extremity Assessment: Generalized weakness   Lower Extremity Assessment Lower Extremity Assessment: Defer to PT evaluation       Communication Communication Communication: HOH   Cognition Arousal/Alertness: Awake/alert Behavior During Therapy: WFL for tasks assessed/performed Overall Cognitive Status: Within Functional Limits for tasks assessed                                                Home Living Family/patient expects to be discharged to:: Skilled nursing facility                                 Additional Comments: Pt is from SNF where he has resided for the past 2 years.      Prior Functioning/Environment Level of Independence: Needs assistance  Gait / Transfers Assistance Needed: uses lift for OOB; reports he cannot sit unsupported due to wobbly ADL's / Homemaking Assistance Needed: Dependent with ADLs; but can feed himself Communication / Swallowing Assistance Needed: HOH          OT Problem List: Decreased strength;Decreased range of  motion;Impaired balance (sitting and/or standing);Obesity;Pain      OT Treatment/Interventions: Self-care/ADL training;Therapeutic exercise;Balance training    OT Goals(Current goals can be found in the care plan section) Acute Rehab OT Goals Patient Stated Goal: wanted to get OOB OT Goal Formulation: With patient Time For Goal Achievement: 12/22/16 Potential to Achieve Goals: Good  OT Frequency: Min 2X/week           Co-evaluation PT/OT/SLP Co-Evaluation/Treatment: Yes Reason for Co-Treatment: For patient/therapist safety   OT goals addressed during session: Strengthening/ROM      AM-PAC PT "6 Clicks" Daily Activity     Outcome Measure Help from another person eating meals?: A Little Help from another person taking care of personal grooming?: A Little Help from another person  toileting, which includes using toliet, bedpan, or urinal?: Total Help from another person bathing (including washing, rinsing, drying)?: Total Help from another person to put on and taking off regular upper body clothing?: Total Help from another person to put on and taking off regular lower body clothing?: Total 6 Click Score: 10   End of Session Nurse Communication: Need for lift equipment  Activity Tolerance: Patient tolerated treatment well Patient left: in bed (unable to get OOB today due to maxi-sky currently not charged)  OT Visit Diagnosis: Muscle weakness (generalized) (M62.81)                Time: 7591-6384 OT Time Calculation (min): 23 min Charges:  OT General Charges $OT Visit: 1 Visit OT Evaluation $OT Eval Moderate Complexity: [address] [name], [place] [phone] 12/08/2016

## 2016-12-09 DIAGNOSIS — Z515 Encounter for palliative care: Secondary | ICD-10-CM

## 2016-12-09 DIAGNOSIS — Z66 Do not resuscitate: Secondary | ICD-10-CM

## 2016-12-09 DIAGNOSIS — M86671 Other chronic osteomyelitis, right ankle and foot: Secondary | ICD-10-CM

## 2016-12-09 DIAGNOSIS — R52 Pain, unspecified: Secondary | ICD-10-CM

## 2016-12-09 DIAGNOSIS — N390 Urinary tract infection, site not specified: Secondary | ICD-10-CM

## 2016-12-09 DIAGNOSIS — L8990 Pressure ulcer of unspecified site, unspecified stage: Secondary | ICD-10-CM | POA: Insufficient documentation

## 2016-12-09 DIAGNOSIS — I739 Peripheral vascular disease, unspecified: Secondary | ICD-10-CM

## 2016-12-09 LAB — BASIC METABOLIC PANEL
ANION GAP: 5 (ref 5–15)
BUN: 8 mg/dL (ref 6–20)
CALCIUM: 7.3 mg/dL — AB (ref 8.9–10.3)
CO2: 34 mmol/L — ABNORMAL HIGH (ref 22–32)
Chloride: 102 mmol/L (ref 101–111)
Creatinine, Ser: 0.54 mg/dL — ABNORMAL LOW (ref 0.61–1.24)
GLUCOSE: 114 mg/dL — AB (ref 65–99)
Potassium: 3.1 mmol/L — ABNORMAL LOW (ref 3.5–5.1)
SODIUM: 141 mmol/L (ref 135–145)

## 2016-12-09 LAB — CULTURE, BLOOD (ROUTINE X 2)
Culture: NO GROWTH
Culture: NO GROWTH
SPECIAL REQUESTS: ADEQUATE
Special Requests: ADEQUATE

## 2016-12-09 LAB — GLUCOSE, CAPILLARY
GLUCOSE-CAPILLARY: 116 mg/dL — AB (ref 65–99)
GLUCOSE-CAPILLARY: 120 mg/dL — AB (ref 65–99)
Glucose-Capillary: 107 mg/dL — ABNORMAL HIGH (ref 65–99)
Glucose-Capillary: 131 mg/dL — ABNORMAL HIGH (ref 65–99)

## 2016-12-09 LAB — MAGNESIUM: MAGNESIUM: 1.9 mg/dL (ref 1.7–2.4)

## 2016-12-09 MED ORDER — OXYCODONE HCL 5 MG PO TABS
10.0000 mg | ORAL_TABLET | ORAL | Status: DC | PRN
  Administered 2016-12-09 – 2016-12-11 (×5): 10 mg via ORAL
  Filled 2016-12-09 (×5): qty 2

## 2016-12-09 MED ORDER — LORAZEPAM 1 MG PO TABS
1.0000 mg | ORAL_TABLET | ORAL | Status: DC | PRN
  Administered 2016-12-11: 1 mg via ORAL
  Filled 2016-12-09: qty 1

## 2016-12-09 MED ORDER — ACETAMINOPHEN 325 MG PO TABS
650.0000 mg | ORAL_TABLET | Freq: Four times a day (QID) | ORAL | Status: DC
  Administered 2016-12-09 – 2016-12-11 (×9): 650 mg via ORAL
  Filled 2016-12-09 (×9): qty 2

## 2016-12-09 MED ORDER — HYDROCODONE-ACETAMINOPHEN 5-325 MG PO TABS: 2.0000 | ORAL_TABLET | ORAL | Status: DC | PRN

## 2016-12-09 NOTE — Consult Note (Signed)
Consultation Note Date: 12/09/2016   Patient Name: [name]  DOB: [birth year]  MRN: [id number]  Age / Sex: 76 y.o., male  PCP: System, Provider Not In Referring Physician: Modena Jansky, MD  Reason for Consultation: Establishing goals of care, Pain control and Psychosocial/spiritual support  HPI/Patient Profile: 76 y.o. male   admitted on 12/03/2016 with  PMH as outlined below and who is bedbound and resides in a SNF. 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  It has been recommended that he have AKA to which he is reluctant to have.   Overall failure to thrive, continued physical and functional decline over the past year.  He lives at a SNF is bed bound, albumin is 1.8.  He has chronic pain that he tells me is poorly controlled.  He faces treatment option decisions, advanced directive decisions and anticipatory care needs.  PAST MEDICAL HISTORY :  He  has a past medical history of Acute osteomyelitis of calcaneum, right ([place]) (09/30/2016); Adhesive capsulitis; Angina; Anxiety; CAD (coronary artery disease); CHF (congestive heart failure) ([place]); Chronic back pain; Chronic kidney disease; COPD (chronic obstructive pulmonary disease) (HCC); CVA (cerebral infarction) (Questionable history); Depression; Diabetes mellitus; Diverticulosis; Facial rash (09/30/2016); Falls frequently (06/2014); GERD (gastroesophageal reflux disease); Hyperlipidemia; Hypertension; MRSA bacteremia; Myocardial infarction [facility]); Neuromuscular disorder ([place]); On home oxygen therapy; Osteomyelitis ([place]) (2012); PE (pulmonary embolism) (10-15 years ago); Peripheral vascular disease ([place]); Shortness of breath; Sleep apnea; and UTI (urinary tract infection)  (12/03/2016).   Clinical Assessment and Goals of Care:  This NP Wadie Lessen reviewed medical records, received report from team, assessed the patient and then meet at the patient's bedside along with his wife   to discuss diagnosis, prognosis, GOC, EOL wishes disposition and options.  A detailed discussion was had today regarding advanced directives.  Concepts specific to code status, artifical feeding and hydration, continued IV antibiotics and rehospitalization was had.  The difference between a aggressive medical intervention path  and a palliative comfort care path for this patient at this time was had.    Values and goals of care important to patient and family were attempted to be elicited.  Created space and opportunity for [name] to explore and verbalize his feeling regarding his current medical situation.  He speaks to living in a skilled nursing facility, unable to get out of bed, and having no quality of life. He states "This is no way to live"  He makes a clear decision that he is not interested in amputation and that pain control is priority of care.  His wife verbalizes support of his decisions.   Concept of Hospice was discussed  Natural trajectory and expectations at EOL were discussed.  Questions and concerns addressed.   Family encouraged to call with questions or concerns.  PMT will continue to support holistically.     PATIENT with  the support of his wife    SUMMARY OF RECOMMENDATIONS    Code Status/Advance Care Planning:  DNR- documented today  Focus of care is comfort and dignity.  Patient is open to oral antibitics  Symptom Management:   Pain: Tylenol 650 mg every 6 hrs scheduled  Oxycodone 10 mg po every 4 hrs prn  Anxiety: Ativan 0.5 mg po every 4 hrs prn  Palliative Prophylaxis:   Aspiration, Bowel Regimen and Frequent Pain Assessment  Additional Recommendations (Limitations, Scope, Preferences):  Full Comfort  Care  Psycho-social/Spiritual:   Desire for further Chaplaincy support:no  Additional Recommendations: Education on Hospice  Prognosis:   < 3 months  Discharge Planning: [place] with Hospice      Primary Diagnoses: Present on Admission: . Sepsis ([place]) . Septic shock ([place]) . Peripheral vascular disease ([place]) . UTI (urinary tract infection) . Acute on chronic renal failure ([place])   I have reviewed the medical record, interviewed the patient and family, and examined the patient. The following aspects are pertinent.  Past Medical History:  Diagnosis Date  . Acute osteomyelitis of calcaneum, right ([place]) 09/30/2016  . Adhesive capsulitis   . Angina   . Anxiety   . CAD (coronary artery disease)   . CHF (congestive heart failure) ([place])   . Chronic back pain   . Chronic kidney disease    hx of BPH  . COPD (chronic obstructive pulmonary disease) ([place])   . CVA (cerebral infarction) Questionable history  . Depression   . Diabetes mellitus   . Diverticulosis   . Facial rash 09/30/2016  . Falls frequently 06/2014  . GERD (gastroesophageal reflux disease)   . Hyperlipidemia   . Hypertension   . 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 Myocardial infarction ([place])   . Neuromuscular disorder ([place])    HX of diabetic periferal neuropathy  . On home oxygen therapy    "1.5L prn" (04/12/2014)  . Osteomyelitis ([place]) 2012   Sternoclavicular joint   . PE (pulmonary embolism) 10-15 years ago   Lifelong Coumadin  . Peripheral vascular disease ([place])   . Shortness of breath   . Sleep apnea   . UTI (urinary tract infection) 12/03/2016   Social History   Social History  . Marital status: Married    Spouse name: Butch Penny  . Number of children: 3  . Years of education: 12   Occupational History  . Retired-machinest/welder Retired   Social History Main Topics  . Smoking status: Former Smoker    Packs/day: 1.00    Quit date:  06/19/2000  . Smokeless tobacco: Never Used  . Alcohol use No  . Drug use: No  . Sexual activity: No   Other Topics Concern  . None   Social History Narrative   Health Care POA: TBD   Emergency Contact: wife, Butch Penny [phone]   End of Life Plan: pt does not have AD and not interested.   Who lives with you: Butch Penny and step-daughter-Betty   Any pets: none   Diet: Pt has a varied diet of protein, starch and vegetables.   Exercise: Pt does not have regular exercise routine.   Seatbelts: Pt reports wearing seatbelt when in vehicles.    Hobbies: listening to car races.             Family History  Problem Relation Age of Onset  . Heart disease Father   . Heart disease Mother  Scheduled Meds: . aspirin  81 mg Oral Daily  . atorvastatin  20 mg Oral q1800  . carvedilol  3.125 mg Oral BID WC  . docusate sodium  100 mg Oral BID  . DULoxetine  60 mg Oral Q breakfast  . enoxaparin (LOVENOX) injection  110 mg Subcutaneous Q12H  . furosemide  80 mg Oral BID  . gabapentin  600 mg Oral TID  . insulin aspart  0-20 Units Subcutaneous TID WC  . insulin aspart  0-5 Units Subcutaneous QHS  . insulin NPH Human  10 Units Subcutaneous BID AC & HS  . lubiprostone  8 mcg Oral BID WC  . pantoprazole  40 mg Oral Q1200  . polyvinyl alcohol  1 drop Both Eyes BID  . potassium chloride  40 mEq Oral BID  . senna  17.2 mg Oral BID  . sodium chloride flush  10-40 mL Intracatheter Q12H  . tamsulosin  0.4 mg Oral QPC supper  . traZODone  50 mg Oral QHS   Continuous Infusions: . linezolid (ZYVOX) IV Stopped (12/09/16 1143)  . meropenem (MERREM) IV 1 g (12/09/16 1357)   PRN Meds:.HYDROcodone-acetaminophen, hydrocortisone-pramoxine, LORazepam, sodium chloride flush Medications Prior to Admission:  Prior to Admission medications   Medication Sig Start Date End Date Taking? Authorizing Provider  acetaminophen (TYLENOL) 325 MG tablet Take 650 mg by mouth every 6 (six) hours as needed for fever.   Yes  [provider]  Amino Acids-Protein Hydrolys (FEEDING SUPPLEMENT, PRO-STAT SUGAR FREE 64,) LIQD Take 30 mLs by mouth 2 (two) times daily. 11/10/15  Yes Rai, Ripudeep K, MD  aspirin EC 81 MG EC tablet Take 1 tablet (81 mg total) by mouth daily. 11/10/15  Yes Rai, Ripudeep K, MD  atorvastatin (LIPITOR) 20 MG tablet Take 20 mg by mouth at bedtime.    Yes [provider]  carvedilol (COREG) 3.125 MG tablet Take 3.125 mg by mouth 2 (two) times daily with a meal.    Yes [provider]  cetirizine (ZYRTEC ALLERGY) 10 MG tablet Take 10 mg by mouth daily with breakfast.    Yes [provider]  diclofenac sodium (VOLTAREN) 1 % GEL Apply 2 g topically every 8 (eight) hours.    Yes [provider]  docusate sodium (COLACE) 100 MG capsule Take 100 mg by mouth 2 (two) times daily.   Yes [provider]  doxycycline (VIBRA-TABS) 100 MG tablet Take 100 mg by mouth daily.   Yes [provider]  DULoxetine (CYMBALTA) 30 MG capsule Take 90 mg by mouth daily with breakfast.    Yes [provider]  ferrous sulfate 325 (65 FE) MG tablet Take 1 tablet (325 mg total) by mouth daily with breakfast. 10/29/15  Yes Tat, Shanon Brow, MD  furosemide (LASIX) 80 MG tablet Take 1 tablet (80 mg total) by mouth 2 (two) times daily. 10/29/15  Yes Tat, Shanon Brow, MD  gabapentin (NEURONTIN) 600 MG tablet Take 600 mg by mouth 3 (three) times daily.    Yes [provider]  gentamicin cream (GARAMYCIN) 0.1 % Apply 1 application topically 2 (two) times daily. APPLY TO FACE 11/06/16  Yes [provider]  HUMULIN N KWIKPEN 100 UNIT/ML Kiwkpen Inject 20 Units into the skin 2 (two) times daily before a meal. 11/28/16  Yes [provider]  HYDROcodone-acetaminophen (NORCO/VICODIN) 5-325 MG tablet Take 1 tablet by mouth every 6 (six) hours as needed (for pain).    Yes [provider]  hydrocortisone-pramoxine [facility]) rectal foam Place  1 applicator  rectally every 12 (twelve) hours as needed for hemorrhoids.   Yes [provider]  insulin lispro (HUMALOG KWIKPEN) 100 UNIT/ML KiwkPen Inject 0-13 Units into the skin 3 (three) times daily before meals. PER SLIDING SCALE: BGL  0-70=0 units; 71-149 = 8 units, 150-600= 13 units   Yes [provider]  methocarbamol (ROBAXIN) 500 MG tablet Take 500 mg by mouth at bedtime.   Yes [provider]  Multiple Vitamins-Minerals (DECUBI-VITE PO) Take 1 capsule by mouth daily with breakfast.    Yes [provider]  mupirocin ointment (BACTROBAN) 2 % Place 1 application into the nose 2 (two) times daily. Patient taking differently: Place 1 application into the nose 2 (two) times daily. FOR FACIAL RASH 09/30/16  Yes Tommy Medal, Lavell Islam, MD  nitroGLYCERIN (NITROSTAT) 0.4 MG SL tablet Place 1 tablet (0.4 mg total) under the tongue as needed. For chest pains Patient taking differently: Place 0.4 mg under the tongue every 5 (five) minutes x 3 doses as needed for chest pain.  10/19/13  Yes Alveda Reasons, MD  omeprazole (PRILOSEC) 40 MG capsule Take 1 capsule (40 mg total) by mouth daily. 05/10/14  Yes Alveda Reasons, MD  oxyCODONE (OXY IR/ROXICODONE) 5 MG immediate release tablet Take 5 mg by mouth every 8 (eight) hours as needed for severe pain.   Yes [provider]  OXYGEN Inhale 2 L into the lungs continuous.    Yes [provider]  polyethylene glycol (MIRALAX / GLYCOLAX) packet Take 17 g by mouth 4 (four) times daily.    Yes [provider]  potassium chloride SA (K-DUR,KLOR-CON) 20 MEQ tablet Take 20 mEq by mouth daily with breakfast.    Yes [provider]  Propylene Glycol (SYSTANE BALANCE) 0.6 % SOLN Place 1 drop into both eyes 2 (two) times daily.    Yes [provider]  rivaroxaban (XARELTO) 20 MG TABS tablet Take 20 mg by mouth daily with supper.   Yes [provider]  senna (SENOKOT) 8.6 MG TABS tablet Take 17.2  mg by mouth 2 (two) times daily.   Yes [provider]  tamsulosin (FLOMAX) 0.4 MG CAPS capsule Take 1 capsule (0.4 mg total) by mouth daily after supper. 10/19/13  Yes Alveda Reasons, MD  traZODone (DESYREL) 100 MG tablet Take 100 mg by mouth at bedtime.    Yes [provider]  Vitamin D, Ergocalciferol, (DRISDOL) 50000 units CAPS capsule Take 1 capsule (50,000 Units total) by mouth every 7 (seven) days. Patient taking differently: Take 50,000 Units by mouth every Thursday.  05/03/15  Yes Gerlene Fee, NP  Vitamins A & D (VITAMIN A & D) ointment Apply 1 application topically every 12 (twelve) hours as needed for dry skin.    Yes [provider]  bisacodyl (DULCOLAX) 10 MG suppository Place 1 suppository (10 mg total) rectally daily as needed for moderate constipation. Patient not taking: Reported on 12/04/2016 11/10/15   Rai, Vernelle Emerald, MD  guaiFENesin-dextromethorphan (ROBITUSSIN DM) 100-10 MG/5ML syrup Take 15 mLs by mouth every 4 (four) hours as needed for cough. Patient not taking: Reported on 12/04/2016 11/10/15   Rai, Vernelle Emerald, MD  lubiprostone (AMITIZA) 8 MCG capsule Take 8 mcg by mouth 2 (two) times daily with a meal.    [provider]  senna-docusate (SENOKOT-S) 8.6-50 MG tablet Take 1 tablet by mouth 2 (two) times daily. Patient not taking: Reported on 12/04/2016 11/10/15   Mendel Corning, MD  traMADol (ULTRAM) 50 MG tablet Take 1 tablet (50 mg total) by mouth 2 (two) times daily. Patient not taking: Reported on 12/04/2016 08/01/16   Hollace Kinnier L, DO   Allergies  Allergen Reactions  . Diazepam Anxiety    Induces crying  . Vancomycin Other (See Comments)    "red man syndrome"   Review of Systems  Constitutional: Positive for fatigue.  Musculoskeletal: Positive for back pain.  Neurological: Positive for weakness.    Physical Exam  Constitutional: He is oriented to person, place, and time. He appears well-developed.  Cardiovascular:  Normal rate and regular rhythm.   Pulmonary/Chest: He has decreased breath sounds in the right lower field and the left lower field.  Neurological: He is alert and oriented to person, place, and time.  Skin: Skin is warm and dry.  Noted skin breakdown and wounds per EMR    Vital Signs: BP (!) 95/53 (BP Location: Left Arm)   Pulse 91   Temp 98.3 F (36.8 C) (Oral)   Resp 18   Ht 5\' 11"  (1.803 m)   Wt 105.6 kg (232 lb 12.8 oz)   SpO2 98%   BMI 32.47 kg/m  Pain Assessment: No/denies pain   Pain Score: Asleep   SpO2: SpO2: 98 % O2 Device:SpO2: 98 % O2 Flow Rate: .O2 Flow Rate (L/min): 1.5 L/min  IO: Intake/output summary:  Intake/Output Summary (Last 24 hours) at 12/09/16 1449 Last data filed at 12/09/16 1114  Gross per 24 hour  Intake              903 ml  Output             3750 ml  Net            -2847 ml    LBM: Last BM Date: 12/08/16 Baseline Weight: Weight: 104.3 kg (230 lb) Most recent weight: Weight: 105.6 kg (232 lb 12.8 oz)     Palliative Assessment/Data: 30 % at best   Discussed with Dr Algis Liming  Time In: 1600 Time Out: 1715 Time Total: 75 min Greater than 50%  of this time was spent counseling and coordinating care related to the above assessment and plan.  Signed by: Wadie Lessen, NP   Please contact Palliative Medicine Team phone at [phone] for questions and concerns.  For individual provider: See Shea Evans

## 2016-12-09 NOTE — Progress Notes (Signed)
Name: [name] MRN:   [id number] DOB:   [birth year]           ADMISSION DATE:  12/03/2016 CONSULTATION DATE:  12/04/16  REFERRING MD:  Christy Gentles  CHIEF COMPLAINT:  AMS  HISTORY OF PRESENT ILLNESS:  Pt is encephelopathic; therefore, this HPI is obtained from chart review. [name] is a 76 y.o. male with PMH as outlined below and who is bedbound and resides in a SNF. 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  It has been recommended that he have AKA to which she was initially reluctant. Other options presented at the time were hospice. Per ID notes from 12/03/16, patient now agreeable to right AKA and he is pending reevaluation by Dr. Donzetta Matters of vascular surgery.  He was brought to [facility] ED 12/03/16 with AMS and hypotension. BP in ED ranging anywhere from systolic of 37D to 42A, lactate normal. Urine suggestive of UTI. Per RN, when Foley was placed, urine was very thick and had purulent material within it.  He received 3.5 L IV fluids; however, remained hypotensive. Of note he has a limited CODE BLUE and per EDP's conversations with pt's wife, vasopressors are OK.  PAST MEDICAL HISTORY :  He  has a past medical history of Acute osteomyelitis of calcaneum, right ([place]) (09/30/2016); Adhesive capsulitis; Angina; Anxiety; CAD (coronary artery disease); CHF (congestive heart failure) ([place]); Chronic back pain; Chronic kidney disease; COPD (chronic obstructive pulmonary disease) (HCC); CVA (cerebral infarction) (Questionable history); Depression; Diabetes mellitus; Diverticulosis; Facial rash (09/30/2016); Falls frequently (06/2014); GERD (gastroesophageal reflux disease); Hyperlipidemia; Hypertension; MRSA bacteremia; Myocardial infarction [facility]); Neuromuscular disorder ([place]); On home oxygen therapy; Osteomyelitis ([place]) (2012); PE  (pulmonary embolism) (10-15 years ago); Peripheral vascular disease ([place]); Shortness of breath; Sleep apnea; and UTI (urinary tract infection) (12/03/2016).  Patient transferred from [facility], alert and oriented x 4, dressing to right neck from CVL removal.  LAKA, scabbed area to stump, right elbow with bruise and skin tear, skin tear on LAC, redness to buttocks that is blanchable, healed pressure area on right buttock.  Palliative consult and vascular consulting on the patient.  Patient oriented to room with call bell within reach.  Will continue to monitor.

## 2016-12-09 NOTE — Care Management Important Message (Signed)
Important Message  Patient Details  Name: [name] MRN: [id number] Date of Birth: 01/31/1941   Medicare Important Message Given:  Yes    Nathen May 12/09/2016, 9:01 AM

## 2016-12-09 NOTE — Progress Notes (Signed)
Pharmacy Antibiotic and Anticoagulation Note  [name] is a 76 y.o. male admitted on 12/03/2016 with sepsis thought secondary for urinary source.  Pharmacy has been consulted for Linezolid and meropenem dosing. 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  Noted plan for 7d course of antibiotics.  Pt also continues on Lovenox for hx PE.  Xarelto on hold pending decision for AKA due to heel osteo.  CBC stable.  Plan: Continue linezolid 600 mg q 12 hrs - stop date 9/19? Continue meropenem 1g q 8 hrs - stop date 9/19? Continue Lovenox 110mg  SQ q12h Follow-up plans for AKA and/or restart of oral anticoagulation  Height: 5\' 11"  (180.3 cm) Weight: 232 lb 12.8 oz (105.6 kg) IBW/kg (Calculated) : 75.3  Temp (24hrs), Avg:98 F (36.7 C), Min:97.7 F (36.5 C), Max:98.3 F (36.8 C)   Recent Labs Lab 12/03/16 2253 12/03/16 2307 12/04/16 0546 12/05/16 0543 12/05/16 1014 12/06/16 0531 12/07/16 0616 12/08/16 0610 12/09/16 0450  WBC 15.0*  --  20.9* 17.7*  --  11.6* 11.2*  --   --   CREATININE 1.38*  --  1.52*  --  1.07 0.84 0.81 0.68 0.54*  LATICACIDVEN  --  1.29  --   --   --   --   --   --   --     Estimated Creatinine Clearance: 95.6 mL/min (A) (by C-G formula based on SCr of 0.54 mg/dL (L)).    Allergies  Allergen Reactions  . Diazepam Anxiety    Induces crying  . Vancomycin Other (See Comments)    "red man syndrome"   Antimicrobials this admission:  Vanc 9/12>9/13 Zyvox 9/13>> Merrem 9/12> Zosyn 9/12 x 1  Dose adjustments this admission:  N/a  Microbiology results:  9/12 BCx >ngtd 9/13 MRSA PCR >negative  Thank you for allowing pharmacy to be a part of this patient's care.  [employer], Pharm.D., BCPS Clinical Pharmacist Pager: [phone] Clinical phone for 12/09/2016 from 8:30-4:00 is [phone]. After 4pm, please call Main Rx [phone]) for assistance. 12/09/2016 2:22 PM

## 2016-12-09 NOTE — Progress Notes (Signed)
[place] TEAM 1 - Stepdown/ICU TEAM  MACARIO SHEAR  WCH:[id number] DOB: 1940-10-23 DOA: 12/03/2016 PCP: System, Provider Not In    Brief Narrative:  76 y.o. male who is bedbound and resides in a SNF. 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  It has been recommended that he have an AKA but he has been reluctant. Per ID notes from 12/03/16, patient now agreeable to AKA and is pending re-evaluation by Dr. Donzetta Matters.  He was brought to [facility] ED 12/03/16 with AMS and hypotension. BP in ED ranging anywhere from systolic of 60 to 35T, lactate normal. Urine suggestive of UTI. Per RN, when Foley was placed, urine was very thick and had purulent material within it.  He received 3.5 L IV fluids but remained hypotensive.   Significant Events: 9/12 admit w/ septic shock  9/13 CVL placed   Subjective: Patient stated that he is ready to undergo surgery. I discussed with him in detail regarding input from prior physicians taking care of him and concern that surgery may not be the best option for him. He denied pain, dyspnea.  Assessment & Plan:  Shock due to Pyelonephritis  Shock resolved - continue empiric antibiotic - unfortunately urine culture was not obtained at admission - complete empiric 7 day treatment course. Currently on meropenem . Blood cultures 2: Negative, final report. No prior recent urine culture results to guide antibiotic choice. No record of ESBL in urine culture in the past. Patient should complete 7 days of IV antibiotics in the next 24 hours.  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 -165 ML since admission. No weight recorded for today. Mildly volume overloaded.   Filed Weights   12/06/16 0600 12/07/16 0322 12/08/16 0600  Weight: 109.4 kg (241 lb 1.6 oz) 110.2 kg (242 lb 14.4 oz) 105.6 kg (232 lb 12.8 oz)    PVD - Chronic right calcaneal osteo  S/p L LE AKA - Vasc Surgery (Dr. Donzetta Matters) has seen - Dr. Thereasa Solo discussed w/ Dr. Donzetta Matters and patient is willing to proceed with surgery if the pt desires, but Dr. Donzetta Matters not convinced it is in his best interest at this time (high risk of poor healing, low pain tolerance, minimal to no improvement in quality of life). Palliative care was consulted. I discussed with palliative care team and patient has opted no surgery and improving pain control at this time. Patient currently on linezolid for this. Consider discussing with ID on 9/19 regarding prolonged course of oral antibiotics at discharge.  HTN BP trending down w/ diuresis - cont diuretic for now and follow trend . Controlled.  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 Since surgery not being considered, may consider switching back to Xarelto.   Chronic hypoxic respiratory failure  Has been liberated to RA following diuresis   Acute on CKD ?chronic hydro per CT > improved on CT 9/12 compared to prior imaging - renal fxn has normalized w/ volume expansion - cont to follow w/ ongoing diuresis   Recent Labs Lab 12/05/16 1014 12/06/16 0531 12/07/16 3154  12/08/16 0610 12/09/16 0450  CREATININE 1.07 0.84 0.81 0.68 0.54*   Hyponatremia - presumably initially hypovolemic Corrected with volume resuscitation during initial portion of hospital stay   Hypokalemia Due to poor intake + diuretic - replace and follow   Hypomagnesemia Due to diuretic. Replaced.  GERD PPI  DM CBG reasonably controlled - follow w/o change today   Acute encephalopathy presumably due to sepsis - unclear of baseline mental status - appears to be resolved   Obesity - Body mass index is  32.47 kg/m.   Microcytic anemia Stable.    DVT prophylaxis: lovenox, full dose  Code Status: DNR - NO CODE BLUE (pressors and BIPAP ok) Family Communication: no family present at time of exam  Disposition Plan: To be determined  Consultants:  PCCM Vasc Surgery   Antimicrobials:  Vanc 9/12  Zosyn 9/12 Merem 9/12 >  Linezolid 9/13 >  Objective:  Vitals:   12/08/16 1949 12/08/16 2053 12/09/16 0510 12/09/16 1309  BP: (!) 94/56 (!) 102/50 (!) 105/58 (!) 95/53  Pulse: (!) 103 80 92 91  Resp: 17 17 17 18   Temp: 97.9 F (36.6 C) 98.1 F (36.7 C) 97.7 F (36.5 C) 98.3 F (36.8 C)  TempSrc: Oral Oral Oral Oral  SpO2: 93% 92% 95% 98%  Weight:      Height:         Intake/Output Summary (Last 24 hours) at 12/09/16 1720 Last data filed at 12/09/16 1114  Gross per 24 hour  Intake              903 ml  Output             3050 ml  Net            -2147 ml   Filed Weights   12/06/16 0600 12/07/16 0322 12/08/16 0600  Weight: 109.4 kg (241 lb 1.6 oz) 110.2 kg (242 lb 14.4 oz) 105.6 kg (232 lb 12.8 oz)    Examination: General: No acute respiratory distress - alert and conversant . Pleasant elderly male, moderately built and obese, lying comfortably supine in bed. Lungs: CTA B - distant BS th/o but w/ no wheeze or crackles . No increased work of breathing. Cardiovascular: RRR w/o rub or M. No JVD. 1+ right leg edema.  Abdomen: Nontender, obese, soft, bowel sounds positive, no rebound Extremities: 1+ anasarca. Right lateral heel with subcentimeter ulceration without acute findings. Left AKA healed stump.  CBC:  Recent Labs Lab 12/03/16 2253 12/04/16 0546 12/05/16 0543 12/06/16 0531 12/07/16 0616  WBC 15.0* 20.9* 17.7* 11.6* 11.2*  HGB 12.1* 8.8* 8.3* 8.4* 8.9*  HCT 38.2* 28.9* 27.1* 27.6* 29.6*  MCV 75.6* 76.7* 77.2* 77.3* 78.1  PLT 138* 226 245 274 272   Basic Metabolic Panel:  Recent Labs Lab 12/04/16 0546 12/05/16 1014 12/06/16 0531 12/07/16 0616  12/08/16 0610 12/09/16 0450  NA 133* 138 137 142 141 141  K 3.6 3.4* 3.6 2.9* 3.3* 3.1*  CL 100* 108 109 111 104 102  CO2 24 22 22 26 28  34*  GLUCOSE 151* 172* 222* 150* 125* 114*  BUN 32* 26* 21* 16 11 8   CREATININE 1.52* 1.07 0.84 0.81 0.68 0.54*  CALCIUM 7.7* 7.6* 7.7* 7.8* 7.6* 7.3*  MG 1.8  --   --   --  1.5* 1.9  PHOS 4.9*  --   --   --   --   --    GFR: Estimated Creatinine Clearance: 95.6 mL/min (A) (by C-G formula based  on SCr of 0.54 mg/dL (L)).  Liver Function Tests:  Recent Labs Lab 12/03/16 2253 12/07/16 0616  AST 25 22  ALT 24 24  ALKPHOS 97 75  BILITOT 0.5 0.6  PROT 4.8* 5.3*  ALBUMIN 1.6* 1.8*    Coagulation Profile:  Recent Labs Lab 12/04/16 0546  INR 2.15    HbA1C: Hemoglobin A1C  Date/Time Value Ref Range Status  04/25/2016 6.2  Final   Hgb A1c MFr Bld  Date/Time Value Ref Range Status  11/01/2015 11:34 AM 6.4 (H) 4.8 - 5.6 % Final    Comment:    (NOTE)         Pre-diabetes: 5.7 - 6.4         Diabetes: >6.4         Glycemic control for adults with diabetes: <7.0   05/10/2014 05:25 AM 7.4 (H) 4.8 - 5.6 % Final    Comment:    (NOTE)         Pre-diabetes: 5.7 - 6.4         Diabetes: >6.4         Glycemic control for adults with diabetes: <7.0     CBG:  Recent Labs Lab 12/08/16 1204 12/08/16 1632 12/08/16 2054 12/09/16 0753 12/09/16 1158  GLUCAP 176* 117* 112* 116* 120*    Recent Results (from the past 240 hour(s))  Blood Culture (routine x 2)     Status: None   Collection Time: 12/03/16 10:52 PM  Result Value Ref Range Status   Specimen Description BLOOD LEFT FOREARM  Final   Special Requests   Final    BOTTLES DRAWN AEROBIC ONLY Blood Culture adequate volume   Culture NO GROWTH 5 DAYS  Final   Report Status 12/09/2016 FINAL  Final  Blood Culture (routine x 2)     Status: None   Collection Time: 12/03/16 11:40 PM  Result Value Ref Range Status   Specimen Description BLOOD RIGHT ANTECUBITAL  Final   Special Requests    Final    BOTTLES DRAWN AEROBIC AND ANAEROBIC Blood Culture adequate volume   Culture NO GROWTH 5 DAYS  Final   Report Status 12/09/2016 FINAL  Final  MRSA PCR Screening     Status: None   Collection Time: 12/04/16  4:11 AM  Result Value Ref Range Status   MRSA by PCR NEGATIVE NEGATIVE Final    Comment:        The GeneXpert MRSA Assay (FDA approved for NASAL specimens only), is one component of a comprehensive MRSA colonization surveillance program. It is not intended to diagnose MRSA infection nor to guide or monitor treatment for MRSA infections.      Scheduled Meds: . acetaminophen  650 mg Oral Q6H  . carvedilol  3.125 mg Oral BID WC  . docusate sodium  100 mg Oral BID  . DULoxetine  60 mg Oral Q breakfast  . furosemide  80 mg Oral BID  . gabapentin  600 mg Oral TID  . insulin aspart  0-20 Units Subcutaneous TID WC  . insulin aspart  0-5 Units Subcutaneous QHS  . insulin NPH Human  10 Units Subcutaneous BID AC & HS  . lubiprostone  8 mcg Oral BID WC  . pantoprazole  40 mg Oral Q1200  . polyvinyl alcohol  1 drop Both Eyes BID  . potassium chloride  40 mEq Oral BID  . senna  17.2 mg Oral BID  . sodium chloride flush  10-40 mL Intracatheter Q12H  . tamsulosin  0.4 mg Oral QPC supper  . traZODone  50 mg Oral QHS     LOS: 5 days   Naviah Belfield, MD, FACP, FHM. [facility] Pager [phone]  If 7PM-7AM, please contact night-coverage [email or web address] Password [facility] 12/09/2016, 5:28 PM

## 2016-12-10 ENCOUNTER — Telehealth: Payer: Self-pay | Admitting: Vascular Surgery

## 2016-12-10 DIAGNOSIS — R627 Adult failure to thrive: Secondary | ICD-10-CM

## 2016-12-10 DIAGNOSIS — R52 Pain, unspecified: Secondary | ICD-10-CM

## 2016-12-10 DIAGNOSIS — Z66 Do not resuscitate: Secondary | ICD-10-CM

## 2016-12-10 DIAGNOSIS — M869 Osteomyelitis, unspecified: Secondary | ICD-10-CM

## 2016-12-10 DIAGNOSIS — N10 Acute tubulo-interstitial nephritis: Secondary | ICD-10-CM

## 2016-12-10 DIAGNOSIS — Z515 Encounter for palliative care: Secondary | ICD-10-CM

## 2016-12-10 LAB — CBC
HCT: 31.5 % — ABNORMAL LOW (ref 39.0–52.0)
Hemoglobin: 9.5 g/dL — ABNORMAL LOW (ref 13.0–17.0)
MCH: 23.6 pg — ABNORMAL LOW (ref 26.0–34.0)
MCHC: 30.2 g/dL (ref 30.0–36.0)
MCV: 78.4 fL (ref 78.0–100.0)
PLATELETS: 244 10*3/uL (ref 150–400)
RBC: 4.02 MIL/uL — AB (ref 4.22–5.81)
RDW: 17.5 % — AB (ref 11.5–15.5)
WBC: 9.9 10*3/uL (ref 4.0–10.5)

## 2016-12-10 LAB — BASIC METABOLIC PANEL
ANION GAP: 6 (ref 5–15)
BUN: 5 mg/dL — AB (ref 6–20)
CALCIUM: 7.4 mg/dL — AB (ref 8.9–10.3)
CO2: 34 mmol/L — ABNORMAL HIGH (ref 22–32)
Chloride: 101 mmol/L (ref 101–111)
Creatinine, Ser: 0.57 mg/dL — ABNORMAL LOW (ref 0.61–1.24)
GFR calc Af Amer: >60 mL/min (ref >60)
Glucose, Bld: 94 mg/dL (ref 65–99)
POTASSIUM: 3.5 mmol/L (ref 3.5–5.1)
SODIUM: 141 mmol/L (ref 135–145)

## 2016-12-10 LAB — GLUCOSE, CAPILLARY
GLUCOSE-CAPILLARY: 97 mg/dL (ref 65–99)
GLUCOSE-CAPILLARY: 139 mg/dL — AB (ref 65–99)
Glucose-Capillary: 122 mg/dL — ABNORMAL HIGH (ref 65–99)
Glucose-Capillary: 108 mg/dL — ABNORMAL HIGH (ref 65–99)

## 2016-12-10 MED ORDER — SODIUM CHLORIDE 0.9 % IV BOLUS (SEPSIS)
500.0000 mL | Freq: Once | INTRAVENOUS | Status: AC
  Administered 2016-12-10: 500 mL via INTRAVENOUS

## 2016-12-10 MED ORDER — RIVAROXABAN 20 MG PO TABS
20.0000 mg | ORAL_TABLET | Freq: Every day | ORAL | Status: DC
  Administered 2016-12-10: 20 mg via ORAL
  Filled 2016-12-10: qty 1

## 2016-12-10 MED ORDER — DOXYCYCLINE HYCLATE 100 MG PO TABS
100.0000 mg | ORAL_TABLET | Freq: Two times a day (BID) | ORAL | Status: DC
  Administered 2016-12-10 – 2016-12-11 (×2): 100 mg via ORAL
  Filled 2016-12-10 (×2): qty 1

## 2016-12-10 NOTE — Progress Notes (Signed)
Physical Therapy Treatment Patient Details Name: [name] MRN: [id number] DOB: 01/16/1941 Today's Date: 12/10/2016    History of Present Illness [name] is a 76 y.o. 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    PT Comments    Pt was seen for evaluation of his mobility, noted his leg was in ER and fixed tightly but responds to holding stretches.  Will progress his ROM as tolerated, gather up his support for the activity and per wife is opting for comfort measures more than any aggressive rehab.  Focus on his protection of RLE joints and ease of care.   Follow Up Recommendations  SNF;Supervision/Assistance - 24 hour     Equipment Recommendations  None recommended by PT    Recommendations for Other Services       Precautions / Restrictions Precautions Precautions: Fall Restrictions Weight Bearing Restrictions: No    Mobility  Bed Mobility                  Transfers                    Ambulation/Gait                 Stairs            Wheelchair Mobility    Modified Rankin (Stroke Patients Only)       Balance                                            Cognition Arousal/Alertness: Awake/alert Behavior During Therapy: WFL for tasks assessed/performed Overall Cognitive Status: Within Functional Limits for tasks assessed                                        Exercises General Exercises - Lower Extremity Ankle Circles/Pumps: AAROM;Right;10 reps Quad Sets: AAROM;Right;10 reps Heel Slides: AAROM;Right;5 reps Hip ABduction/ADduction: Other (comment) (hip rotation for ROM stretch)    General Comments        Pertinent Vitals/Pain Pain Assessment: Faces Faces Pain Scale: Hurts little more Pain Location: R leg with ROM Pain  Descriptors / Indicators: Tightness Pain Intervention(s): Premedicated before session;Repositioned;Limited activity within patient's tolerance    Home Living                      Prior Function            PT Goals (current goals can now be found in the care plan section) Acute Rehab PT Goals Patient Stated Goal: none stated Progress towards PT goals: Progressing toward goals    Frequency    Min 2X/week      PT Plan Current plan remains appropriate    Co-evaluation              AM-PAC PT "6 Clicks" Daily Activity  Outcome Measure  Difficulty turning over in bed (including adjusting bedclothes, sheets and blankets)?: Unable Difficulty moving from lying on back to sitting on the side of the bed? : Unable Difficulty sitting down on and standing up from a chair with arms (e.g.,  wheelchair, bedside commode, etc,.)?: Unable Help needed moving to and from a bed to chair (including a wheelchair)?: Total Help needed walking in hospital room?: Total Help needed climbing 3-5 steps with a railing? : Total 6 Click Score: 6    End of Session Equipment Utilized During Treatment: Oxygen Activity Tolerance: Patient limited by fatigue Patient left: in bed;with call bell/phone within reach;with bed alarm set;with family/visitor present Nurse Communication: Mobility status PT Visit Diagnosis: Muscle weakness (generalized) (M62.81);Other abnormalities of gait and mobility (R26.89);Pain Pain - part of body: Knee     Time: 5830-9407 PT Time Calculation (min) (ACUTE ONLY): 16 min  Charges:  $Therapeutic Exercise: 8-22 mins                    G Codes:  Functional Assessment Tool Used: AM-PAC 6 Clicks Basic Mobility    Ramond Dial 12/10/2016, 2:38 PM   Mee Hives, PT MS Acute Rehab Dept. Number: [place]

## 2016-12-10 NOTE — Progress Notes (Signed)
B/P 88/52 manually Dr. Algis Liming notified and will monitor.

## 2016-12-10 NOTE — Progress Notes (Signed)
CSW confirmed with Althea Charon that patient is able to have Hospice follow him at the facility.  [name] LCSWA [phone]

## 2016-12-10 NOTE — Progress Notes (Signed)
Patient ID: [name], male   DOB: Sep 15, 1940, 76 y.o.   MRN: [id number]  This NP visited patient at the bedside as a follow up to  yesterday's [place].  Patient appears comfortable and tells me that his pain is "much better today"  Continued conversation regarding diagnosis, prognosis, goals of care and disposition.  Patient is comfortable with his decision to decline amputation and focus on comfort.  He anticipates discharge back to his skilled nursing facility/[facility].    Discussed again that hospice services would support him as he focuses on comfort, quality, and dignity at this time in his life  Discussed with patient the importance of continued conversation with family and their  medical providers regarding overall plan of care and treatment options,  ensuring decisions are within the context of the patients values and GOCs.  Questions and concerns addressed  Placed a call and left a message for the patient's wife but has not received a return call.  Time in  0800         Time out  0815      Total time  spent on the unit was 15 minutes  Discussed with Dr. Algis Liming  Greater than 50% of the time was spent in counseling and coordination of care  Wadie Lessen NP  Palliative Medicine Team Team Phone # [phone] Pager [phone]

## 2016-12-10 NOTE — Progress Notes (Signed)
[name]  UUV:[id number] DOB: 1940-09-07 DOA: 12/03/2016 PCP: System, Provider Not In    Brief Narrative:  76 y.o. male who is bedbound and resides in a SNF. 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  It has been recommended that he have an AKA but he has been reluctant. Per ID notes from 12/03/16, patient now agreeable to AKA and is pending re-evaluation by Dr. Donzetta Matters.  He was brought to [facility] ED 12/03/16 with AMS and hypotension. BP in ED ranging anywhere from systolic of 60 to 47Q, lactate normal. Urine suggestive of UTI. Per RN, when Foley was placed, urine was very thick and had purulent material within it.  He received 3.5 L IV fluids but remained hypotensive.   Significant Events: 9/12 admit w/ septic shock  9/13 CVL placed   Subjective: Seen patient this morning. He was sleeping but woke up. Denied pain or any complaints. As per RN, no acute issues reported.  Assessment & Plan:  Shock due to Pyelonephritis  Shock resolved - unfortunately urine culture was not obtained at admission. Blood cultures 2: Negative, final report. No prior recent urine culture results to guide antibiotic choice. No record of ESBL in urine culture in the past. Patient completed 7 days of IV meropenem and discontinued 9/19. Monitor.  Combined Systolic and diastolic CHF EF 25-95% w/ grade 2 DD and diffuse hypokinesis - decrease from 30-35% July 2017 - follow daily weights and Is/Os - baseline weights appears to be ~104kg - cont diuresis as renal fxn and BP allow . -0.215 ML since admission. No weight recorded for today. Mildly volume overloaded. Continue oral Lasix.   Filed Weights   12/06/16 0600 12/07/16 0322 12/08/16 0600  Weight: 109.4 kg (241 lb 1.6 oz) 110.2 kg (242 lb 14.4 oz) 105.6 kg (232 lb 12.8 oz)    PVD - Chronic right calcaneal osteo  S/p L LE AKA - Vasc Surgery (Dr. Donzetta Matters)  has seen dnd did not think that patient was a good candidate for surgery (high risk of poor healing, low pain tolerance, minimal to no improvement in quality of life). Hence palliative care was consulted and he made clear decision that he is not interested in amputation and pain control and comfort his priority of care. His family supports this decision. Palliative care adjusting pain medications. Has completed 1 week of IV linezolid. DC IV antibiotics on 9/19 and transition to oral doxycycline, may stop all antibiotics if her comfort care opted by patient, we will await palliative care follow-up. I discussed in detail with Dr. Donzetta Matters on 9/19 and he indicates that the tiny site on his right heel has not changed in the last 6 months or more and agrees that patient does not good candidate for surgery, okay to switch to oral antibiotics and agrees with palliative care route.  HTN Controlled and even running soft at times. May have to cut back on antihypertensives.  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 Since surgery not being considered, switched back to Xarelto.  Chronic hypoxic respiratory failure  Currently on nasal cannula oxygen, wean to room air as tolerated.  Acute on CKD ?chronic hydro per CT > improved on CT 9/12 compared to prior imaging - renal fxn has normalized w/  volume expansion - cont to follow w/ ongoing diuresis   Recent Labs Lab 12/06/16 0531 12/07/16 0616 12/08/16 0610 12/09/16 0450 12/10/16 0356  CREATININE 0.84 0.81 0.68 0.54* 0.57*   Hyponatremia - presumably initially hypovolemic Corrected with volume resuscitation during initial portion of hospital stay   Hypokalemia Due to poor intake + diuretic - replace and follow   Hypomagnesemia Due to diuretic. Replaced.  GERD PPI  DM CBG reasonably controlled - follow w/o change today   Acute  encephalopathy presumably due to sepsis - unclear of baseline mental status - appears to be resolved   Obesity - Body mass index is 32.47 kg/m.   Microcytic anemia Stable.    DVT prophylaxis: lovenox, full dose  Code Status: DNR - NO CODE BLUE (pressors and BIPAP ok) Family Communication: no family present at time of exam  Disposition Plan: To be determined pending further palliative care input.  Consultants:  PCCM Vasc Surgery   Antimicrobials:  Vanc 9/12 -discontinued Zosyn 9/12-discontinued Merem 9/12 >  discontinued Linezolid 9/13 > discontinue Oral doxycycline  Objective:  Vitals:   12/09/16 1309 12/09/16 2122 12/10/16 0525 12/10/16 1012  BP: (!) 95/53 (!) 94/44 (!) 99/53 (!) 104/58  Pulse: 91 72 68 74  Resp: 18 18 18    Temp: 98.3 F (36.8 C) 98.4 F (36.9 C) 98.5 F (36.9 C)   TempSrc: Oral Oral Oral   SpO2: 98% 97% 98%   Weight:      Height:         Intake/Output Summary (Last 24 hours) at 12/10/16 1237 Last data filed at 12/10/16 0559  Gross per 24 hour  Intake             1160 ml  Output             3450 ml  Net            -2290 ml   Filed Weights   12/06/16 0600 12/07/16 0322 12/08/16 0600  Weight: 109.4 kg (241 lb 1.6 oz) 110.2 kg (242 lb 14.4 oz) 105.6 kg (232 lb 12.8 oz)    Examination: General: No acute respiratory distress - alert and conversant . Pleasant elderly male, moderately built and obese, lying comfortably supine in bed.Stable. Lungs: CTA B - distant BS th/o but w/ no wheeze or crackles . No increased work of breathing. Stable Cardiovascular: RRR w/o rub or M. No JVD. 1+ right leg edema.  Abdomen: Nontender, obese, soft, bowel sounds positive, no rebound Extremities: 1+ anasarca. Right lateral heel with subcentimeter ulceration without acute findings. Left AKA healed stump. CNS: Was sleeping but easily aroused, alert and oriented. No focal neurological deficits.  CBC:  Recent Labs Lab 12/04/16 0546 12/05/16 0543  12/06/16 0531 12/07/16 0616 12/10/16 0356  WBC 20.9* 17.7* 11.6* 11.2* 9.9  HGB 8.8* 8.3* 8.4* 8.9* 9.5*  HCT 28.9* 27.1* 27.6* 29.6* 31.5*  MCV 76.7* 77.2* 77.3* 78.1 78.4  PLT 226 245 274 281 761   Basic Metabolic Panel:  Recent Labs Lab 12/04/16 0546  12/06/16 0531 12/07/16 0616 12/08/16 0610 12/09/16 0450 12/10/16 0356  NA 133*  < > 137 142 141 141 141  K 3.6  < > 3.6 2.9* 3.3* 3.1* 3.5  CL 100*  < > 109 111 104 102 101  CO2 24  < > 22 26 28  34* 34*  GLUCOSE 151*  < > 222* 150* 125* 114* 94  BUN 32*  < > 21* 16 11 8  5*  CREATININE 1.52*  < >  0.84 0.81 0.68 0.54* 0.57*  CALCIUM 7.7*  < > 7.7* 7.8* 7.6* 7.3* 7.4*  MG 1.8  --   --   --  1.5* 1.9  --   PHOS 4.9*  --   --   --   --   --   --   < > = values in this interval not displayed. GFR: Estimated Creatinine Clearance: 95.6 mL/min (A) (by C-G formula based on SCr of 0.57 mg/dL (L)).  Liver Function Tests:  Recent Labs Lab 12/03/16 2253 12/07/16 0616  AST 25 22  ALT 24 24  ALKPHOS 97 75  BILITOT 0.5 0.6  PROT 4.8* 5.3*  ALBUMIN 1.6* 1.8*    Coagulation Profile:  Recent Labs Lab 12/04/16 0546  INR 2.15    HbA1C: Hemoglobin A1C  Date/Time Value Ref Range Status  04/25/2016 6.2  Final   Hgb A1c MFr Bld  Date/Time Value Ref Range Status  11/01/2015 11:34 AM 6.4 (H) 4.8 - 5.6 % Final    Comment:    (NOTE)         Pre-diabetes: 5.7 - 6.4         Diabetes: >6.4         Glycemic control for adults with diabetes: <7.0   05/10/2014 05:25 AM 7.4 (H) 4.8 - 5.6 % Final    Comment:    (NOTE)         Pre-diabetes: 5.7 - 6.4         Diabetes: >6.4         Glycemic control for adults with diabetes: <7.0     CBG:  Recent Labs Lab 12/09/16 1158 12/09/16 1744 12/09/16 2120 12/10/16 0819 12/10/16 1212  GLUCAP 120* 107* 131* 97 122*    Recent Results (from the past 240 hour(s))  Blood Culture (routine x 2)     Status: None   Collection Time: 12/03/16 10:52 PM  Result Value Ref Range Status    Specimen Description BLOOD LEFT FOREARM  Final   Special Requests   Final    BOTTLES DRAWN AEROBIC ONLY Blood Culture adequate volume   Culture NO GROWTH 5 DAYS  Final   Report Status 12/09/2016 FINAL  Final  Blood Culture (routine x 2)     Status: None   Collection Time: 12/03/16 11:40 PM  Result Value Ref Range Status   Specimen Description BLOOD RIGHT ANTECUBITAL  Final   Special Requests   Final    BOTTLES DRAWN AEROBIC AND ANAEROBIC Blood Culture adequate volume   Culture NO GROWTH 5 DAYS  Final   Report Status 12/09/2016 FINAL  Final  MRSA PCR Screening     Status: None   Collection Time: 12/04/16  4:11 AM  Result Value Ref Range Status   MRSA by PCR NEGATIVE NEGATIVE Final    Comment:        The GeneXpert MRSA Assay (FDA approved for NASAL specimens only), is one component of a comprehensive MRSA colonization surveillance program. It is not intended to diagnose MRSA infection nor to guide or monitor treatment for MRSA infections.      Scheduled Meds: . acetaminophen  650 mg Oral Q6H  . carvedilol  3.125 mg Oral BID WC  . docusate sodium  100 mg Oral BID  . DULoxetine  60 mg Oral Q breakfast  . furosemide  80 mg Oral BID  . gabapentin  600 mg Oral TID  . insulin aspart  0-20 Units Subcutaneous TID WC  . insulin aspart  0-5 Units Subcutaneous QHS  . insulin NPH Human  10 Units Subcutaneous BID AC & HS  . lubiprostone  8 mcg Oral BID WC  . pantoprazole  40 mg Oral Q1200  . polyvinyl alcohol  1 drop Both Eyes BID  . potassium chloride  40 mEq Oral BID  . senna  17.2 mg Oral BID  . sodium chloride flush  10-40 mL Intracatheter Q12H  . tamsulosin  0.4 mg Oral QPC supper  . traZODone  50 mg Oral QHS     LOS: 6 days   Jerrod Damiano, MD, FACP, FHM. [facility] Pager [phone]  If 7PM-7AM, please contact night-coverage [email or web address] Password [facility] 12/10/2016, 12:37 PM

## 2016-12-10 NOTE — Telephone Encounter (Signed)
Pts daughter Tye Maryland called.  Says Dr. Donzetta Matters called her last week and pt does not want amputation and wants hospice care. Tye Maryland wants to know if there has been any update regarding her father's treatment plan.  I advised her that Dr. Donzetta Matters is not rounding on her father at the hospital but will be in the office on Friday if she would like to speak to him.  She says she will be coming down from [place] and will make an appt with our office for her father soon.  She will contact us with any concerns.  Thurston Hole., LPN

## 2016-12-10 NOTE — Progress Notes (Signed)
Addendum  Received a call from RN earlier this afternoon reporting hypotension with SBP's in the 80s and patient complained of transient "numbness in the chest". Probably over diuresed with Lasix and also on carvedilol. Will temporarily DC carvedilol and Lasix. IV normal saline bolus 500 mL 2. Discussed with RN. Monitor closely.  Vernell Leep, MD, FACP, FHM. [facility] Pager [phone]  If 7PM-7AM, please contact night-coverage [email or web address] Password [email or web address] 12/10/2016, 6:00 PM

## 2016-12-11 DIAGNOSIS — R338 Other retention of urine: Secondary | ICD-10-CM

## 2016-12-11 DIAGNOSIS — R627 Adult failure to thrive: Secondary | ICD-10-CM

## 2016-12-11 LAB — BASIC METABOLIC PANEL
ANION GAP: 7 (ref 5–15)
BUN: 5 mg/dL — ABNORMAL LOW (ref 6–20)
CHLORIDE: 100 mmol/L — AB (ref 101–111)
CO2: 31 mmol/L (ref 22–32)
Calcium: 7.4 mg/dL — ABNORMAL LOW (ref 8.9–10.3)
Creatinine, Ser: 0.55 mg/dL — ABNORMAL LOW (ref 0.61–1.24)
GFR calc Af Amer: >60 mL/min (ref >60)
GFR calc non Af Amer: >60 mL/min (ref >60)
GLUCOSE: 109 mg/dL — AB (ref 65–99)
POTASSIUM: 3.9 mmol/L (ref 3.5–5.1)
Sodium: 138 mmol/L (ref 135–145)

## 2016-12-11 LAB — GLUCOSE, CAPILLARY
GLUCOSE-CAPILLARY: 138 mg/dL — AB (ref 65–99)
Glucose-Capillary: 113 mg/dL — ABNORMAL HIGH (ref 65–99)
Glucose-Capillary: 127 mg/dL — ABNORMAL HIGH (ref 65–99)

## 2016-12-11 MED ORDER — CARVEDILOL 3.125 MG PO TABS
3.1250 mg | ORAL_TABLET | Freq: Two times a day (BID) | ORAL | Status: DC
  Filled 2016-12-11: qty 1

## 2016-12-11 MED ORDER — FUROSEMIDE 40 MG PO TABS
40.0000 mg | ORAL_TABLET | Freq: Every day | ORAL | Status: DC
  Filled 2016-12-11: qty 1

## 2016-12-11 MED ORDER — DULOXETINE HCL 30 MG PO CPEP
60.0000 mg | ORAL_CAPSULE | Freq: Every day | ORAL | Status: AC
Start: 2016-12-11 — End: ?

## 2016-12-11 MED ORDER — OXYCODONE HCL 10 MG PO TABS: 10.0000 mg | ORAL_TABLET | ORAL | 0 refills | Status: AC | PRN

## 2016-12-11 MED ORDER — TRAZODONE HCL 100 MG PO TABS: 50.0000 mg | ORAL_TABLET | Freq: Every day | ORAL | Status: AC

## 2016-12-11 MED ORDER — INSULIN ISOPHANE HUMAN 100 UNIT/ML KWIKPEN: 10.0000 [IU] | PEN_INJECTOR | Freq: Two times a day (BID) | SUBCUTANEOUS | Status: AC

## 2016-12-11 MED ORDER — LORAZEPAM 1 MG PO TABS: 1.0000 mg | ORAL_TABLET | Freq: Three times a day (TID) | ORAL | 0 refills | Status: AC | PRN

## 2016-12-11 MED ORDER — DOXYCYCLINE HYCLATE 100 MG PO TABS: 100.0000 mg | ORAL_TABLET | Freq: Two times a day (BID) | ORAL | Status: AC

## 2016-12-11 MED ORDER — NITROGLYCERIN 0.4 MG SL SUBL: 0.4000 mg | SUBLINGUAL_TABLET | SUBLINGUAL | Status: AC | PRN

## 2016-12-11 NOTE — Discharge Summary (Addendum)
Physician Discharge Summary  [name] GMW:[id number] DOB: 03/05/1941  PCP: System, Provider Not In  Admit date: 12/03/2016 Discharge date: 12/11/2016  Recommendations for Outpatient Follow-up:  1. M.D. at SNF in 3-5 days with repeat labs (CBC & BMP). 2. Recommend hospice consultation and evaluation at SNF.  Home Health: None Equipment/Devices: None    Discharge Condition: Improved and stable  CODE STATUS: DO NOT RESUSCITATE  Diet recommendation: Heart healthy and diabetic diet.  Discharge Diagnoses:  Principal Problem:   Septic shock ([place]) Active Problems:   Peripheral vascular disease ([place])   UTI (urinary tract infection)   Sepsis ([place])   Acute on chronic renal failure ([place])   Hypothermia   Encounter for central line placement   Pressure injury of skin   DNR (do not resuscitate)   Palliative care by specialist   Pain, generalized   Adult failure to thrive   Brief Summary: 76 y.o.malewho is bedbound and resides in a SNF. 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 It has been recommended that he have an AKA but he has been reluctant. Per ID notes from 12/03/16, patient now agreeable to AKA and is pending re-evaluation by Dr. Donzetta Matters.  He was brought to [facility] ED 12/03/16 with AMS and hypotension. BP in ED ranging anywhere from systolic of 60 to 44I, lactate normal. Urine suggestive of UTI. Per RN, when Foley was placed, urine was very thick and had purulent material within it.  He received 3.5 L IV fluids but remained hypotensive.   Significant Events: 9/12 admit w/ septic shock  9/13 CVL placed    Assessment & Plan:  Shock due to Pyelonephritis  Shock resolved - unfortunately urine culture was not obtained at admission. Blood cultures 2: Negative, final report. No prior recent urine culture results to guide antibiotic choice. No record of ESBL in urine  culture in the past. Patient completed 7 days of IV meropenem and discontinued 9/19.   Acute urinary retention /mild to moderate bilateral hydronephrosis Urinary catheter was placed on admission. This was removed this morning, patient has not voided. We will reinsert Foley catheter due to the hydronephrosis and risk for recurrence as well as for comfort since he is going to hospice care.  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 -0.215 ML since admission. No weight recorded for today. Mildly volume overloaded. Patient was diuresed with oral Lasix and is -5.3 L since admission. He became hypotensive on 9/19 likely due to overdiuresis and carvedilol. Both of these medications have been temporarily stopped. His blood pressures are better today. Clinically appears euvolemic. Reassess at SNF in the next couple of days regarding need for starting back Lasix, possibly at a lower dose than before and his carvedilol.   Filed Weights   12/06/16 0600 12/07/16 0322 12/08/16 0600  Weight: 109.4 kg (241 lb 1.6 oz) 110.2 kg (242 lb 14.4 oz) 105.6 kg (232 lb 12.8 oz)    PVD - Chronic right calcaneal osteo  S/p L LE AKA - Vasc Surgery (Dr. Donzetta Matters) has seen dnd did not think that patient was a good candidate for surgery (high risk of poor healing, low pain tolerance, minimal to  no improvement in quality of life). Hence palliative care was consulted and he made clear decision that he is not interested in amputation and pain control and comfort are his priority of care. His family supports this decision. Palliative care adjusting pain medications. Has completed 1 week of IV linezolid. DC IV antibiotics on 9/19 and transition to oral doxycycline, may stop all antibiotics if her comfort care opted by patient, we will await  palliative care follow-up. I discussed in detail with Dr. Donzetta Matters on 9/19 and he indicates that the tiny site on his right heel has not changed in the last 6 months or more and agrees that patient does not good candidate for surgery, okay to switch to oral antibiotics and agrees with palliative care route.Continue oral doxycycline at least for 1 month and then further course to be decided at [facility]. Obviously if patient opts for full comfort care/hospice route then all medications not necessary to comfort can be discontinued.  HTN As stated above, patient became hypotensive on 9/19 secondary to overdiuresis and carvedilol. Both of these medications have been temporarily stopped. Reassess at SNF over the next couple of days to determine if these can be resumed.  HLD Cont atorvastatin  CAD Cont Coreg. Since patient is on Xarelto, ASA was DC'ed.  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  Acute on CKD ?chronic hydro per CT > improved on CT 9/12 compared to prior imaging - renal fxn has normalized.  Last Labs    Recent Labs Lab 12/06/16 0531 12/07/16 0616 12/08/16 0610 12/09/16 0450 12/10/16 0356  CREATININE 0.84 0.81 0.68 0.54* 0.57*     Hyponatremia - presumably initially hypovolemic Corrected with volume resuscitation during initial portion of hospital stay   Hypokalemia Replaced. Since Lasix has been stopped, discontinued potassium supplements as well. Follow BMP periodically at SNF.  Hypomagnesemia Due to diuretic. Replaced.  GERD PPI  DM CBG reasonably controlled on reduced dose of insulin's, continue   Acute encephalopathy presumably due to sepsis - unclear of baseline mental status - appears to be resolved   Obesity - Body mass index is 32.47 kg/m.   Microcytic anemia Stable.     Consultants:  PCCM Vasc Surgery  Palliative care  medicine  Antimicrobials:  Vanc 9/12 -discontinued Zosyn 9/12-discontinued Merem 9/12 > discontinued Linezolid 9/13 > discontinue Oral doxycycline >   Discharge Instructions  Discharge Instructions    (HEART FAILURE PATIENTS) Call MD:  Anytime you have any of the following symptoms: 1) 3 pound weight gain in 24 hours or 5 pounds in 1 week 2) shortness of breath, with or without a dry hacking cough 3) swelling in the hands, feet or stomach 4) if you have to sleep on extra pillows at night in order to breathe.    Complete by:  As directed    Call MD for:  difficulty breathing, headache or visual disturbances    Complete by:  As directed    Call MD for:  extreme fatigue    Complete by:  As directed    Call MD for:  persistant dizziness or light-headedness    Complete by:  As directed    Call MD for:  redness, tenderness, or signs of infection (pain, swelling, redness, odor or green/yellow discharge around incision site)    Complete by:  As directed    Call MD for:  severe uncontrolled pain    Complete by:  As  directed    Call MD for:  temperature >100.4    Complete by:  As directed    Diet - low sodium heart healthy    Complete by:  As directed    Diet Carb Modified    Complete by:  As directed    Increase activity slowly    Complete by:  As directed        Medication List    STOP taking these medications   aspirin 81 MG EC tablet   carvedilol 3.125 MG tablet Commonly known as:  COREG   furosemide 80 MG tablet Commonly known as:  LASIX   gentamicin cream 0.1 % Commonly known as:  GARAMYCIN   guaiFENesin-dextromethorphan 100-10 MG/5ML syrup Commonly known as:  ROBITUSSIN DM   HYDROcodone-acetaminophen 5-325 MG tablet Commonly known as:  NORCO/VICODIN   lubiprostone 8 MCG capsule Commonly known as:  AMITIZA   mupirocin ointment 2 % Commonly known as:  BACTROBAN   potassium chloride SA 20 MEQ tablet Commonly known as:  K-DUR,KLOR-CON   senna-docusate  8.6-50 MG tablet Commonly known as:  Senokot-S   traMADol 50 MG tablet Commonly known as:  ULTRAM     TAKE these medications   acetaminophen 325 MG tablet Commonly known as:  TYLENOL Take 650 mg by mouth every 6 (six) hours as needed for fever.   atorvastatin 20 MG tablet Commonly known as:  LIPITOR Take 20 mg by mouth at bedtime.   bisacodyl 10 MG suppository Commonly known as:  DULCOLAX Place 1 suppository (10 mg total) rectally daily as needed for moderate constipation.   DECUBI-VITE PO Take 1 capsule by mouth daily with breakfast.   diclofenac sodium 1 % Gel Commonly known as:  VOLTAREN Apply 2 g topically every 8 (eight) hours.   docusate sodium 100 MG capsule Commonly known as:  COLACE Take 100 mg by mouth 2 (two) times daily.   doxycycline 100 MG tablet Commonly known as:  VIBRA-TABS Take 1 tablet (100 mg total) by mouth 2 (two) times daily. What changed:  when to take this   DULoxetine 30 MG capsule Commonly known as:  CYMBALTA Take 2 capsules (60 mg total) by mouth daily with breakfast. What changed:  how much to take   feeding supplement (PRO-STAT SUGAR FREE 64) Liqd Take 30 mLs by mouth 2 (two) times daily.   ferrous sulfate 325 (65 FE) MG tablet Take 1 tablet (325 mg total) by mouth daily with breakfast.   gabapentin 600 MG tablet Commonly known as:  NEURONTIN Take 600 mg by mouth 3 (three) times daily.   HUMALOG KWIKPEN 100 UNIT/ML KiwkPen Generic drug:  insulin lispro Inject 0-13 Units into the skin 3 (three) times daily before meals. PER SLIDING SCALE: BGL  0-70=0 units; 71-149 = 8 units, 150-600= 13 units   hydrocortisone-pramoxine rectal foam Commonly known as:  PROCTOFOAM-HC Place 1 applicator rectally every 12 (twelve) hours as needed for hemorrhoids.   Insulin NPH (Human) (Isophane) 100 UNIT/ML Kiwkpen Commonly known as:  HUMULIN N KWIKPEN Inject 10 Units into the skin 2 (two) times daily before a meal. What changed:  how much to  take   LORazepam 1 MG tablet Commonly known as:  ATIVAN Take 1 tablet (1 mg total) by mouth every 8 (eight) hours as needed for anxiety.   methocarbamol 500 MG tablet Commonly known as:  ROBAXIN Take 500 mg by mouth at bedtime.   nitroGLYCERIN 0.4 MG SL tablet Commonly known as:  NITROSTAT Place 1 tablet (  0.4 mg total) under the tongue every 5 (five) minutes x 3 doses as needed for chest pain.   omeprazole 40 MG capsule Commonly known as:  PRILOSEC Take 1 capsule (40 mg total) by mouth daily.   Oxycodone HCl 10 MG Tabs Take 1 tablet (10 mg total) by mouth every 4 (four) hours as needed for moderate pain or severe pain. What changed:  medication strength  how much to take  when to take this  reasons to take this   OXYGEN Inhale 2 L into the lungs continuous.   polyethylene glycol packet Commonly known as:  MIRALAX / GLYCOLAX Take 17 g by mouth 4 (four) times daily.   rivaroxaban 20 MG Tabs tablet Commonly known as:  XARELTO Take 20 mg by mouth daily with supper.   senna 8.6 MG Tabs tablet Commonly known as:  SENOKOT Take 17.2 mg by mouth 2 (two) times daily.   SYSTANE BALANCE 0.6 % Soln Generic drug:  Propylene Glycol Place 1 drop into both eyes 2 (two) times daily.   tamsulosin 0.4 MG Caps capsule Commonly known as:  FLOMAX Take 1 capsule (0.4 mg total) by mouth daily after supper.   traZODone 100 MG tablet Commonly known as:  DESYREL Take 0.5 tablets (50 mg total) by mouth at bedtime. What changed:  how much to take   vitamin A & D ointment Apply 1 application topically every 12 (twelve) hours as needed for dry skin.   Vitamin D (Ergocalciferol) 50000 units Caps capsule Commonly known as:  DRISDOL Take 1 capsule (50,000 Units total) by mouth every 7 (seven) days. What changed:  when to take this   ZYRTEC ALLERGY 10 MG tablet Generic drug:  cetirizine Take 10 mg by mouth daily with breakfast.       Contact information for follow-up providers     M.D. at SNF. Schedule an appointment as soon as possible for a visit.   Why:  To be seen in 3-5 days with repeat labs (CBC & BMP). Recommend hospice consultation and follow-up at SNF.           Contact information for after-discharge care    Destination    [facility] Follow up.   Specialty:  [place] information: [facility] [phone]                 Allergies  Allergen Reactions  . Diazepam Anxiety    Induces crying  . Vancomycin Other (See Comments)    "red man syndrome"      Procedures/Studies: Ct Abdomen Pelvis W Contrast  Result Date: 12/04/2016 CLINICAL DATA:  Abdominal pain and distention today. EXAM: CT ABDOMEN AND PELVIS WITH CONTRAST TECHNIQUE: Multidetector CT imaging of the abdomen and pelvis was performed using the standard protocol following bolus administration of intravenous contrast. CONTRAST:  100 ml ISOVUE-300 IOPAMIDOL (ISOVUE-300) INJECTION 61% COMPARISON:  CT abdomen and pelvis 04/29/2016, 11/13/2016 and 03/15/2011. FINDINGS: Lower chest: There is cardiomegaly. Dependent basilar atelectasis is noted. No pleural or pericardial effusion. Hepatobiliary: No focal liver abnormality is seen. No gallstones, gallbladder wall thickening, or biliary dilatation. Pancreas: Unremarkable. No pancreatic ductal dilatation or surrounding inflammatory changes. Spleen: No focal abnormality. Mild splenomegaly at 15.8 cm, unchanged. Adrenals/Urinary Tract: Adrenal glands appear normal. Small right renal cyst is identified. There is mild to moderate bilateral hydronephrosis, worse on the right. Cause for hydronephrosis is not identified. Distal ureters are obscured by streak artifact in  the pelvis. Foley catheter is in place in the urinary bladder. Hydronephrosis is improved compared to the most recent examination but new since 05/01/2016. Small bilateral renal cysts are seen. Large  fat containing lesion off the lower pole the right kidney is consistent with an angiomyolipoma is unchanged since the most recent exam. Stomach/Bowel: Stomach and small bowel appear normal. Moderate colonic stool burden noted. The appendix is not visualized but no pericecal inflammatory change is identified. Vascular/Lymphatic: Extensive aortoiliac atherosclerosis without aneurysm. No lymphadenopathy. Reproductive: Prostate gland is largely obscured. Other: No fluid collection or lymphadenopathy. Musculoskeletal: No acute bony abnormality. Left hip arthroplasty in place. Acetabular protrusion on the left is noted. IMPRESSION: No acute abnormality abdomen or pelvis. Mild to moderate bilateral hydronephrosis is greater on the right and appears improved since the most recent examination. Large fat containing lesion off the inferior pole the right kidney compatible with angiomyolipoma is unchanged since the most recent CT. Electronically Signed   By: Inge Rise M.D.   On: 12/04/2016 01:45   Ct Abdomen Pelvis W Contrast  Result Date: 11/13/2016 CLINICAL DATA:  Right-sided abdominal pain for 2-4 weeks. Constipation. EXAM: CT ABDOMEN AND PELVIS WITH CONTRAST TECHNIQUE: Multidetector CT imaging of the abdomen and pelvis was performed using the standard protocol following bolus administration of intravenous contrast. CONTRAST:  100 mL Isovue-300 COMPARISON:  None. FINDINGS: Lower chest: No acute abnormality. Hepatobiliary: No focal liver abnormality is seen. No gallstones, gallbladder wall thickening, or biliary dilatation. Pancreas: Unremarkable. No pancreatic ductal dilatation or surrounding inflammatory changes. Spleen: Normal in size without focal abnormality. Adrenals/Urinary Tract: Adrenal glands are unremarkable. Punctate nonobstructing left renal calculus. Bilateral mild hydroureteronephrosis with a distended bladder. No obstructing mass or stricture identified. 11.6 x 9.9 cm fat attenuating mass arising  from the inferior pole of the right kidney most consistent with angiomyolipoma. Given the size this is at risk for hemorrhage. Stomach/Bowel: Stomach is within normal limits. No evidence of bowel wall thickening, distention, or inflammatory changes. Moderate amount of stool throughout the colon. Vascular/Lymphatic: Abdominal aortic atherosclerosis. Normal caliber abdominal aorta. No lymphadenopathy. Reproductive: Prostate is unremarkable. Other: No abdominal wall hernia or abnormality. No abdominopelvic ascites. Musculoskeletal: No acute or significant osseous findings. Mild generalized gluteal atrophy. IMPRESSION: 1. No acute abdominal or pelvic pathology. 2. 11.6 x 9.9 cm fat attenuating mass arising from the inferior pole of the right kidney most consistent with angiomyolipoma. Given the size this is at risk for hemorrhage. Electronically Signed   By: Kathreen Devoid   On: 11/13/2016 10:17   Dg Chest Port 1 View  Result Date: 12/05/2016 CLINICAL DATA:  Respiratory failure. EXAM: PORTABLE CHEST 1 VIEW COMPARISON:  12/04/2016. FINDINGS: Right IJ line in stable position. Prior CABG. Cardiomegaly with pulmonary vascular prominence and bilateral interstitial prominence with small left pleural effusion. Progressive bibasilar atelectasis. Prior cervical spine fusion . IMPRESSION: 1.  Right IJ line stable position. 2. Prior CABG. Cardiomegaly with pulmonary venous congestion bilateral interstitial prominence consistent with congestive heart failure. Small left pleural effusion. 3.  Progressive bibasilar atelectasis . Electronically Signed   By: Marcello Moores  Register   On: 12/05/2016 06:28   Dg Chest Port 1 View  Result Date: 12/04/2016 CLINICAL DATA:  Central line placement EXAM: PORTABLE CHEST 1 VIEW COMPARISON:  Yesterday FINDINGS: Right IJ central line with tip at the upper SVC. No evidence of pneumothorax. No detectable new mediastinal widening, limited by rotation. There is chronic cardiomegaly and vascular pedicle  widening. The patient is status post CABG. Low volume  chest with streaky left perihilar and bibasilar densities. No Kerley lines or visible effusion. IMPRESSION: 1. New central line with tip near the upper SVC. Negative for pneumothorax. 2. Low volume chest with atelectatic type opacities. Electronically Signed   By: Monte Fantasia M.D.   On: 12/04/2016 14:06   Dg Chest Port 1 View  Result Date: 12/03/2016 CLINICAL DATA:  76 y/o M; 1 hour altered mental status, code sepsis. EXAM: PORTABLE CHEST 1 VIEW COMPARISON:  04/29/2016 chest radiograph FINDINGS: Stable enlarged cardiac silhouette given projection and technique. Median sternotomy wires are aligned. Anterior cervical fusion hardware partially visualized. Low lung volumes accentuate pulmonary markings. Streaky opacities at left lung base probably represent atelectasis. No focal consolidation, effusion, or pneumothorax identified. No acute osseous abnormality is evident. IMPRESSION: Stable mild cardiomegaly. Low lung volumes. Left basilar atelectasis. No acute pulmonary process identified. Electronically Signed   By: Kristine Garbe M.D.   On: 12/03/2016 23:26      Subjective: Patient appeared comfortable this morning. Denied complaints. Specifically denied pain, dyspnea, cough, dizziness or lightheadedness.  Discharge Exam:  Vitals:   12/11/16 0355 12/11/16 0524 12/11/16 1055 12/11/16 1335  BP:  (!) 116/51 (!) 89/52 (!) 106/53  Pulse:  98 81 88  Resp:  14    Temp:  98.5 F (36.9 C)    TempSrc:  Oral    SpO2:  100%    Weight: 104.3 kg (230 lb)     Height:        General: No acute respiratory distress - alert and conversant . Pleasant elderly male, moderately built and obese, lying comfortably supine in bed.Stable. Lungs: CTA B - distant BS th/o but w/ no wheeze or crackles . No increased work of breathing.  Cardiovascular: RRR w/o rub or M. No JVD. No right leg edema.  Abdomen: Nontender, obese, soft, bowel sounds positive,  no rebound Extremities: Right lateral heel with subcentimeter ulceration without acute findings. Left AKA healed stump. CNS: Alert and oriented. No focal neurological deficits.     The results of significant diagnostics from this hospitalization (including imaging, microbiology, ancillary and laboratory) are listed below for reference.     Microbiology: Recent Results (from the past 240 hour(s))  Blood Culture (routine x 2)     Status: None   Collection Time: 12/03/16 10:52 PM  Result Value Ref Range Status   Specimen Description BLOOD LEFT FOREARM  Final   Special Requests   Final    BOTTLES DRAWN AEROBIC ONLY Blood Culture adequate volume   Culture NO GROWTH 5 DAYS  Final   Report Status 12/09/2016 FINAL  Final  Blood Culture (routine x 2)     Status: None   Collection Time: 12/03/16 11:40 PM  Result Value Ref Range Status   Specimen Description BLOOD RIGHT ANTECUBITAL  Final   Special Requests   Final    BOTTLES DRAWN AEROBIC AND ANAEROBIC Blood Culture adequate volume   Culture NO GROWTH 5 DAYS  Final   Report Status 12/09/2016 FINAL  Final  MRSA PCR Screening     Status: None   Collection Time: 12/04/16  4:11 AM  Result Value Ref Range Status   MRSA by PCR NEGATIVE NEGATIVE Final    Comment:        The GeneXpert MRSA Assay (FDA approved for NASAL specimens only), is one component of a comprehensive MRSA colonization surveillance program. It is not intended to diagnose MRSA infection nor to guide or monitor treatment for MRSA infections.  Labs: CBC:  Recent Labs Lab 12/05/16 0543 12/06/16 0531 12/07/16 0616 12/10/16 0356  WBC 17.7* 11.6* 11.2* 9.9  HGB 8.3* 8.4* 8.9* 9.5*  HCT 27.1* 27.6* 29.6* 31.5*  MCV 77.2* 77.3* 78.1 78.4  PLT 245 274 281 048   Basic Metabolic Panel:  Recent Labs Lab 12/07/16 0616 12/08/16 0610 12/09/16 0450 12/10/16 0356 12/11/16 0344  NA 142 141 141 141 138  K 2.9* 3.3* 3.1* 3.5 3.9  CL 111 104 102 101 100*  CO2  26 28 34* 34* 31  GLUCOSE 150* 125* 114* 94 109*  BUN 16 11 8  5* 5*  CREATININE 0.81 0.68 0.54* 0.57* 0.55*  CALCIUM 7.8* 7.6* 7.3* 7.4* 7.4*  MG  --  1.5* 1.9  --   --    Liver Function Tests:  Recent Labs Lab 12/07/16 0616  AST 22  ALT 24  ALKPHOS 75  BILITOT 0.6  PROT 5.3*  ALBUMIN 1.8*    CBG:  Recent Labs Lab 12/10/16 1212 12/10/16 1649 12/10/16 2101 12/11/16 0722 12/11/16 1204  GLUCAP 122* 108* 139* 113* 127*   Urinalysis    Component Value Date/Time   COLORURINE YELLOW 12/03/2016 2258   APPEARANCEUR CLOUDY (A) 12/03/2016 2258   LABSPEC 1.008 12/03/2016 2258   PHURINE 7.0 12/03/2016 2258   GLUCOSEU NEGATIVE 12/03/2016 2258   HGBUR MODERATE (A) 12/03/2016 2258   HGBUR negative 03/06/2010 1419   BILIRUBINUR NEGATIVE 12/03/2016 2258   KETONESUR NEGATIVE 12/03/2016 2258   PROTEINUR 100 (A) 12/03/2016 2258   UROBILINOGEN 0.2 09/13/2014 0945   NITRITE NEGATIVE 12/03/2016 2258   LEUKOCYTESUR LARGE (A) 12/03/2016 2258    Discussed with patient's daughter via phone. Updated care and answered questions.  Time coordinating discharge: Over 30 minutes  SIGNED:  Vernell Leep, MD, FACP, [place]. [facility] Pager [phone]  If 7PM-7AM, please contact night-coverage [email or web address] Password [email or web address] 12/11/2016, 3:08 PM   Addendum: responding to a CDI query  Patient possibly has Stage 2 CKD.  Vernell Leep, MD, FACP, FHM. [facility] Pager [phone]  If 7PM-7AM, please contact night-coverage [email or web address] Password [facility] January 17, 2017, 10:11 AM

## 2016-12-11 NOTE — Discharge Instructions (Signed)

## 2016-12-11 NOTE — Progress Notes (Signed)
Patient will DC to: [facility] Anticipated DC date: 12/11/16 Family notified: Sister Transport by: Corey Harold 4:30pm after foley placed   Per MD patient ready for DC to [facility]. RN, patient, patient's family, and facility notified of DC. Discharge Summary sent to facility. RN given number for report [phone]). DC packet on chart. Ambulance transport requested for patient.   CSW signing off.  Cedric Fishman, [place] Social Worker [phone]

## 2016-12-11 NOTE — Progress Notes (Signed)
Occupational Therapy Treatment Patient Details Name: [name] MRN: [id number] DOB: 08-06-1940 Today's Date: 12/11/2016    History of present illness [name] is a 76 y.o. 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   OT comments  Pt completed grooming tasks and self feeding supine with HOB elevated.  He needed max to total assist for rolling side to side for positioning with increased pain in his back and neck when completing this.  He was able to participate in BUE shoulder and elbow AROM exercises to increase strength however he still needs min assist for shoulder flexion in the RUE to approximately 90 degrees with end range pain in the glenohumeral joint.  LUE shoulder flexion AROM 0-120 degrees.  End range pain as well.  Will continue to follow for acute OT, pt will need use of a maxi sky for OOB.    Follow Up Recommendations  SNF;Supervision/Assistance - 24 hour    Equipment Recommendations  None recommended by OT    Recommendations for Other Services      Precautions / Restrictions Precautions Precautions: Fall Precaution Comments: L AKA Restrictions Weight Bearing Restrictions: No       Mobility Bed Mobility   Bed Mobility: Rolling Rolling: Total assist         General bed mobility comments: total assist for rolling to the right with max assist to the left  Transfers                          ADL either performed or assessed with clinical judgement   ADL Overall ADL's : Needs assistance/impaired Eating/Feeding: Set up   Grooming: Wash/dry hands;Wash/dry face;Set up;Bed level                                 General ADL Comments: Pt needed max assist for rolling to the right in supine with total assist for rolling to the left.  Increased pain noted in his back, neck,  and both shoulders with functional movement.  He completed BUE AROM exercises for shoulders and elbows.  See exercise section for details.               Cognition Arousal/Alertness: Awake/alert Behavior During Therapy: WFL for tasks assessed/performed Overall Cognitive Status: Within Functional Limits for tasks assessed                                 General Comments: Pt hard of hearing as well, needed repetition from therapist.        Exercises General Exercises - Upper Extremity Shoulder Flexion: AROM;Both;10 reps;Supine Elbow Flexion: AROM;10 reps;Supine           Pertinent Vitals/ Pain       Pain Assessment: Faces Faces Pain Scale: Hurts little more Pain Location: shoulders bilaterally, neck, and back Pain Descriptors / Indicators: Discomfort;Grimacing Pain Intervention(s): Limited activity within patient's tolerance;Monitored during session;Repositioned         Frequency  Min 2X/week        Progress Toward Goals  OT Goals(current goals can now be found in the care plan section)  Progress towards OT goals: Progressing toward goals  Plan Discharge plan remains appropriate       AM-PAC PT "6 Clicks" Daily Activity     Outcome Measure   Help from another person eating meals?: A Little Help from another person taking care of personal grooming?: A Little Help from another person toileting, which includes using toliet, bedpan, or urinal?: Total Help from another person bathing (including washing, rinsing, drying)?: Total Help from another person to put on and taking off regular upper body clothing?: Total Help from another person to put on and taking off regular lower body clothing?: Total 6 Click Score: 10    End of Session    OT Visit Diagnosis: Muscle weakness (generalized) (M62.81)   Activity Tolerance Patient limited by pain   Patient Left in bed;with call bell/phone within reach   Nurse Communication          Time:  4492-0100 OT Time Calculation (min): 33 min  Charges: OT General Charges $OT Visit: 1 Visit OT Treatments $Self Care/Home Management : 8-22 mins $Therapeutic Exercise: 8-22 mins     [name] OTR/L 12/11/2016, 11:26 AM

## 2016-12-11 NOTE — Progress Notes (Signed)
[name] to be D/C'd to [facility] SNF per MD order.  Discussed with the patient and all questions fully answered.  VSS, Skin clean, dry and intact without evidence of skin break down, no evidence of skin tears noted. IV catheter discontinued intact. Site without signs and symptoms of complications. Dressing and pressure applied.  An After Visit Summary was printed and given to the patient and PTAR. PTAR received prescriptions.  D/c education completed with patient/family including follow up instructions, medication list, d/c activities limitations if indicated, with other d/c instructions as indicated by MD - patient able to verbalize understanding, all questions fully answered.   Patient instructed to return to ED, call 911, or call MD for any changes in condition.   Patient escorted via stretcher, and D/C to SNF via private PTAR.  [name] 12/11/2016 5:50 PM

## 2016-12-22 DEATH — deceased

## 2017-01-02 ENCOUNTER — Ambulatory Visit: Payer: Medicare Other | Admitting: Vascular Surgery
